# Patient Record
Sex: Male | Born: 1945 | Race: White | Hispanic: No | Marital: Married | Smoking: Former smoker
Health system: Southern US, Community
[De-identification: ages and names within clinical notes are randomized; demographics above are authoritative.]

## PROBLEM LIST (undated history)

## (undated) DIAGNOSIS — I499 Cardiac arrhythmia, unspecified: Secondary | ICD-10-CM

## (undated) DIAGNOSIS — R52 Pain, unspecified: Secondary | ICD-10-CM

## (undated) DIAGNOSIS — I2699 Other pulmonary embolism without acute cor pulmonale: Secondary | ICD-10-CM

## (undated) DIAGNOSIS — Z974 Presence of external hearing-aid: Secondary | ICD-10-CM

## (undated) DIAGNOSIS — H9190 Unspecified hearing loss, unspecified ear: Secondary | ICD-10-CM

## (undated) DIAGNOSIS — J189 Pneumonia, unspecified organism: Secondary | ICD-10-CM

## (undated) DIAGNOSIS — G629 Polyneuropathy, unspecified: Secondary | ICD-10-CM

## (undated) DIAGNOSIS — I10 Essential (primary) hypertension: Secondary | ICD-10-CM

## (undated) DIAGNOSIS — K219 Gastro-esophageal reflux disease without esophagitis: Secondary | ICD-10-CM

## (undated) DIAGNOSIS — F419 Anxiety disorder, unspecified: Secondary | ICD-10-CM

## (undated) DIAGNOSIS — E785 Hyperlipidemia, unspecified: Secondary | ICD-10-CM

## (undated) DIAGNOSIS — R39198 Other difficulties with micturition: Secondary | ICD-10-CM

## (undated) DIAGNOSIS — Z87442 Personal history of urinary calculi: Secondary | ICD-10-CM

## (undated) DIAGNOSIS — S32000A Wedge compression fracture of unspecified lumbar vertebra, initial encounter for closed fracture: Secondary | ICD-10-CM

## (undated) DIAGNOSIS — N400 Enlarged prostate without lower urinary tract symptoms: Secondary | ICD-10-CM

## (undated) DIAGNOSIS — I4891 Unspecified atrial fibrillation: Secondary | ICD-10-CM

## (undated) DIAGNOSIS — C9000 Multiple myeloma not having achieved remission: Secondary | ICD-10-CM

## (undated) DIAGNOSIS — R972 Elevated prostate specific antigen [PSA]: Secondary | ICD-10-CM

## (undated) DIAGNOSIS — I639 Cerebral infarction, unspecified: Secondary | ICD-10-CM

## (undated) DIAGNOSIS — T4145XA Adverse effect of unspecified anesthetic, initial encounter: Secondary | ICD-10-CM

## (undated) DIAGNOSIS — A419 Sepsis, unspecified organism: Secondary | ICD-10-CM

## (undated) DIAGNOSIS — T8859XA Other complications of anesthesia, initial encounter: Secondary | ICD-10-CM

## (undated) DIAGNOSIS — R002 Palpitations: Secondary | ICD-10-CM

## (undated) HISTORY — PX: EYE SURGERY: SHX253

## (undated) HISTORY — DX: Gastro-esophageal reflux disease without esophagitis: K21.9

## (undated) HISTORY — DX: Benign prostatic hyperplasia without lower urinary tract symptoms: N40.0

## (undated) HISTORY — PX: LIMBAL STEM CELL TRANSPLANT: SHX1969

## (undated) HISTORY — DX: Sepsis, unspecified organism: A41.9

## (undated) HISTORY — DX: Unspecified atrial fibrillation: I48.91

## (undated) HISTORY — DX: Cerebral infarction, unspecified: I63.9

## (undated) HISTORY — DX: Other pulmonary embolism without acute cor pulmonale: I26.99

## (undated) HISTORY — DX: Elevated prostate specific antigen (PSA): R97.20

## (undated) HISTORY — DX: Anxiety disorder, unspecified: F41.9

## (undated) HISTORY — DX: Essential (primary) hypertension: I10

## (undated) HISTORY — DX: Multiple myeloma not having achieved remission: C90.00

## (undated) HISTORY — DX: Hyperlipidemia, unspecified: E78.5

## (undated) HISTORY — DX: Other difficulties with micturition: R39.198

---

## 1990-10-09 HISTORY — PX: KNEE ARTHROSCOPY: SUR90

## 1992-10-09 HISTORY — PX: BACK SURGERY: SHX140

## 2003-11-04 ENCOUNTER — Other Ambulatory Visit: Payer: Self-pay

## 2003-12-06 ENCOUNTER — Other Ambulatory Visit: Payer: Self-pay

## 2004-06-29 ENCOUNTER — Other Ambulatory Visit: Payer: Self-pay

## 2006-03-21 ENCOUNTER — Other Ambulatory Visit: Payer: Self-pay

## 2006-03-21 ENCOUNTER — Inpatient Hospital Stay: Payer: Self-pay | Admitting: Internal Medicine

## 2006-10-09 HISTORY — PX: OTHER SURGICAL HISTORY: SHX169

## 2007-02-07 ENCOUNTER — Ambulatory Visit: Payer: Self-pay | Admitting: Oncology

## 2007-02-22 ENCOUNTER — Ambulatory Visit: Payer: Self-pay

## 2007-02-28 ENCOUNTER — Ambulatory Visit: Payer: Self-pay | Admitting: Oncology

## 2007-03-10 ENCOUNTER — Ambulatory Visit: Payer: Self-pay | Admitting: Oncology

## 2007-04-09 ENCOUNTER — Ambulatory Visit: Payer: Self-pay | Admitting: Oncology

## 2007-05-10 ENCOUNTER — Ambulatory Visit: Payer: Self-pay | Admitting: Oncology

## 2007-06-10 ENCOUNTER — Ambulatory Visit: Payer: Self-pay | Admitting: Oncology

## 2007-07-10 ENCOUNTER — Ambulatory Visit: Payer: Self-pay | Admitting: Oncology

## 2007-09-09 ENCOUNTER — Ambulatory Visit: Payer: Self-pay | Admitting: Oncology

## 2007-09-16 ENCOUNTER — Ambulatory Visit: Payer: Self-pay | Admitting: Oncology

## 2007-10-10 ENCOUNTER — Ambulatory Visit: Payer: Self-pay | Admitting: Oncology

## 2007-11-10 ENCOUNTER — Ambulatory Visit: Payer: Self-pay | Admitting: Oncology

## 2007-12-08 ENCOUNTER — Ambulatory Visit: Payer: Self-pay | Admitting: Oncology

## 2008-01-08 ENCOUNTER — Ambulatory Visit: Payer: Self-pay | Admitting: Oncology

## 2008-02-07 ENCOUNTER — Ambulatory Visit: Payer: Self-pay | Admitting: Oncology

## 2008-03-09 ENCOUNTER — Ambulatory Visit: Payer: Self-pay | Admitting: Oncology

## 2008-04-08 ENCOUNTER — Ambulatory Visit: Payer: Self-pay | Admitting: Oncology

## 2008-04-11 ENCOUNTER — Ambulatory Visit: Payer: Self-pay | Admitting: Family Medicine

## 2008-05-09 ENCOUNTER — Ambulatory Visit: Payer: Self-pay | Admitting: Oncology

## 2008-06-09 ENCOUNTER — Ambulatory Visit: Payer: Self-pay | Admitting: Oncology

## 2008-07-09 ENCOUNTER — Ambulatory Visit: Payer: Self-pay | Admitting: Oncology

## 2008-08-09 ENCOUNTER — Ambulatory Visit: Payer: Self-pay | Admitting: Oncology

## 2008-09-08 ENCOUNTER — Ambulatory Visit: Payer: Self-pay | Admitting: Oncology

## 2008-10-09 ENCOUNTER — Ambulatory Visit: Payer: Self-pay | Admitting: Oncology

## 2008-11-09 ENCOUNTER — Ambulatory Visit: Payer: Self-pay | Admitting: Oncology

## 2008-12-07 ENCOUNTER — Ambulatory Visit: Payer: Self-pay | Admitting: Oncology

## 2009-01-07 ENCOUNTER — Ambulatory Visit: Payer: Self-pay | Admitting: Oncology

## 2009-01-12 ENCOUNTER — Ambulatory Visit: Payer: Self-pay | Admitting: Oncology

## 2009-02-06 ENCOUNTER — Ambulatory Visit: Payer: Self-pay | Admitting: Oncology

## 2009-03-09 ENCOUNTER — Ambulatory Visit: Payer: Self-pay | Admitting: Oncology

## 2009-04-08 ENCOUNTER — Ambulatory Visit: Payer: Self-pay | Admitting: Oncology

## 2009-04-24 ENCOUNTER — Ambulatory Visit: Payer: Self-pay

## 2009-05-09 ENCOUNTER — Ambulatory Visit: Payer: Self-pay | Admitting: Oncology

## 2009-05-10 ENCOUNTER — Ambulatory Visit: Payer: Self-pay | Admitting: Pain Medicine

## 2009-05-25 ENCOUNTER — Ambulatory Visit: Payer: Self-pay | Admitting: Pain Medicine

## 2009-06-10 ENCOUNTER — Ambulatory Visit: Payer: Self-pay | Admitting: Physician Assistant

## 2009-06-22 ENCOUNTER — Ambulatory Visit: Payer: Self-pay | Admitting: Pain Medicine

## 2009-07-07 ENCOUNTER — Ambulatory Visit: Payer: Self-pay | Admitting: Physician Assistant

## 2009-07-09 ENCOUNTER — Ambulatory Visit: Payer: Self-pay | Admitting: Oncology

## 2009-07-27 ENCOUNTER — Ambulatory Visit: Payer: Self-pay | Admitting: Oncology

## 2009-08-09 ENCOUNTER — Ambulatory Visit: Payer: Self-pay | Admitting: Oncology

## 2009-11-09 ENCOUNTER — Ambulatory Visit: Payer: Self-pay | Admitting: Oncology

## 2009-11-16 ENCOUNTER — Ambulatory Visit: Payer: Self-pay | Admitting: Oncology

## 2009-12-07 ENCOUNTER — Ambulatory Visit: Payer: Self-pay | Admitting: Oncology

## 2010-01-07 ENCOUNTER — Ambulatory Visit: Payer: Self-pay | Admitting: Oncology

## 2010-02-06 ENCOUNTER — Ambulatory Visit: Payer: Self-pay | Admitting: Oncology

## 2010-03-09 ENCOUNTER — Ambulatory Visit: Payer: Self-pay | Admitting: Oncology

## 2010-04-08 ENCOUNTER — Ambulatory Visit: Payer: Self-pay | Admitting: Oncology

## 2010-04-27 ENCOUNTER — Emergency Department: Payer: Self-pay | Admitting: Unknown Physician Specialty

## 2010-05-09 ENCOUNTER — Ambulatory Visit: Payer: Self-pay | Admitting: Oncology

## 2010-06-09 ENCOUNTER — Ambulatory Visit: Payer: Self-pay | Admitting: Oncology

## 2010-07-09 ENCOUNTER — Ambulatory Visit: Payer: Self-pay | Admitting: Oncology

## 2010-07-13 ENCOUNTER — Ambulatory Visit: Payer: Self-pay | Admitting: Oncology

## 2010-08-03 ENCOUNTER — Ambulatory Visit: Payer: Self-pay | Admitting: Pain Medicine

## 2010-08-08 ENCOUNTER — Ambulatory Visit: Payer: Self-pay

## 2010-08-09 ENCOUNTER — Ambulatory Visit: Payer: Self-pay | Admitting: Oncology

## 2010-09-08 ENCOUNTER — Ambulatory Visit: Payer: Self-pay | Admitting: Oncology

## 2010-10-09 ENCOUNTER — Ambulatory Visit: Payer: Self-pay | Admitting: Oncology

## 2010-10-09 HISTORY — PX: PAIN PUMP IMPLANTATION: SHX330

## 2010-11-09 ENCOUNTER — Ambulatory Visit: Payer: Self-pay | Admitting: Oncology

## 2010-12-05 ENCOUNTER — Ambulatory Visit: Payer: Self-pay | Admitting: Pain Medicine

## 2010-12-08 ENCOUNTER — Ambulatory Visit: Payer: Self-pay | Admitting: Oncology

## 2011-01-02 ENCOUNTER — Inpatient Hospital Stay: Payer: Self-pay | Admitting: Pain Medicine

## 2011-01-02 ENCOUNTER — Ambulatory Visit: Payer: Self-pay | Admitting: Pain Medicine

## 2011-01-08 ENCOUNTER — Ambulatory Visit: Payer: Self-pay | Admitting: Oncology

## 2011-01-16 ENCOUNTER — Ambulatory Visit: Payer: Self-pay | Admitting: Pain Medicine

## 2011-01-25 ENCOUNTER — Ambulatory Visit: Payer: Self-pay | Admitting: Pain Medicine

## 2011-01-27 ENCOUNTER — Ambulatory Visit: Payer: Self-pay | Admitting: Pain Medicine

## 2011-02-07 ENCOUNTER — Ambulatory Visit: Payer: Self-pay | Admitting: Oncology

## 2011-02-07 ENCOUNTER — Ambulatory Visit: Payer: Self-pay | Admitting: Pain Medicine

## 2011-02-13 ENCOUNTER — Ambulatory Visit: Payer: Self-pay | Admitting: Pain Medicine

## 2011-02-14 ENCOUNTER — Ambulatory Visit: Payer: Self-pay | Admitting: Pain Medicine

## 2011-02-20 ENCOUNTER — Ambulatory Visit: Payer: Self-pay | Admitting: Pain Medicine

## 2011-02-27 ENCOUNTER — Ambulatory Visit: Payer: Self-pay | Admitting: Pain Medicine

## 2011-03-08 ENCOUNTER — Ambulatory Visit: Payer: Self-pay | Admitting: Pain Medicine

## 2011-03-10 ENCOUNTER — Ambulatory Visit: Payer: Self-pay | Admitting: Oncology

## 2011-03-10 DEATH — deceased

## 2011-03-15 ENCOUNTER — Other Ambulatory Visit: Payer: Self-pay | Admitting: Pain Medicine

## 2011-03-15 ENCOUNTER — Ambulatory Visit: Payer: Self-pay | Admitting: Oncology

## 2011-03-15 ENCOUNTER — Ambulatory Visit: Payer: Self-pay | Admitting: Pain Medicine

## 2011-03-22 ENCOUNTER — Ambulatory Visit: Payer: Self-pay | Admitting: Pain Medicine

## 2011-03-27 ENCOUNTER — Ambulatory Visit: Payer: Self-pay | Admitting: Pain Medicine

## 2011-04-10 ENCOUNTER — Ambulatory Visit: Payer: Self-pay | Admitting: Pain Medicine

## 2011-04-11 ENCOUNTER — Ambulatory Visit: Payer: Self-pay | Admitting: Oncology

## 2011-04-19 ENCOUNTER — Ambulatory Visit: Payer: Self-pay | Admitting: Oncology

## 2011-04-24 ENCOUNTER — Ambulatory Visit: Payer: Self-pay | Admitting: Pain Medicine

## 2011-04-25 ENCOUNTER — Ambulatory Visit: Payer: Self-pay | Admitting: Pain Medicine

## 2011-05-10 ENCOUNTER — Ambulatory Visit: Payer: Self-pay | Admitting: Oncology

## 2011-05-15 ENCOUNTER — Ambulatory Visit: Payer: Self-pay | Admitting: Pain Medicine

## 2011-05-22 ENCOUNTER — Ambulatory Visit: Payer: Self-pay | Admitting: Pain Medicine

## 2011-05-31 ENCOUNTER — Ambulatory Visit: Payer: Self-pay | Admitting: Oncology

## 2011-06-10 ENCOUNTER — Ambulatory Visit: Payer: Self-pay | Admitting: Oncology

## 2011-07-10 ENCOUNTER — Ambulatory Visit: Payer: Self-pay | Admitting: Pain Medicine

## 2011-08-04 ENCOUNTER — Ambulatory Visit: Payer: Self-pay | Admitting: Oncology

## 2011-08-10 ENCOUNTER — Ambulatory Visit: Payer: Self-pay | Admitting: Oncology

## 2011-09-18 ENCOUNTER — Ambulatory Visit: Payer: Self-pay | Admitting: Pain Medicine

## 2011-10-04 ENCOUNTER — Ambulatory Visit: Payer: Self-pay | Admitting: Oncology

## 2011-10-10 ENCOUNTER — Ambulatory Visit: Payer: Self-pay | Admitting: Oncology

## 2011-10-13 ENCOUNTER — Ambulatory Visit: Payer: Self-pay | Admitting: Oncology

## 2011-11-06 LAB — CBC
HCT: 36.5 % — ABNORMAL LOW (ref 40.0–52.0)
HGB: 12.3 g/dL — ABNORMAL LOW (ref 13.0–18.0)
MCV: 106 fL — ABNORMAL HIGH (ref 80–100)
RBC: 3.45 10*6/uL — ABNORMAL LOW (ref 4.40–5.90)
RDW: 15.4 % — ABNORMAL HIGH (ref 11.5–14.5)

## 2011-11-06 LAB — BASIC METABOLIC PANEL
Calcium, Total: 8.9 mg/dL (ref 8.5–10.1)
Co2: 28 mmol/L (ref 21–32)
Osmolality: 283 (ref 275–301)
Potassium: 3.9 mmol/L (ref 3.5–5.1)
Sodium: 141 mmol/L (ref 136–145)

## 2011-11-07 ENCOUNTER — Inpatient Hospital Stay: Payer: Self-pay | Admitting: Internal Medicine

## 2011-11-07 LAB — DIFFERENTIAL
Basophil %: 0.2 %
Eosinophil #: 0.1 10*3/uL (ref 0.0–0.7)
Eosinophil %: 0.9 %
Monocyte #: 0.7 10*3/uL (ref 0.0–0.7)
Monocyte %: 11.1 %
Neutrophil %: 74.2 %

## 2011-11-10 ENCOUNTER — Emergency Department: Payer: Self-pay | Admitting: Emergency Medicine

## 2011-11-10 ENCOUNTER — Ambulatory Visit: Payer: Self-pay | Admitting: Oncology

## 2011-11-11 ENCOUNTER — Ambulatory Visit: Payer: Self-pay | Admitting: Oncology

## 2011-11-27 ENCOUNTER — Ambulatory Visit: Payer: Self-pay | Admitting: Pain Medicine

## 2012-01-12 ENCOUNTER — Ambulatory Visit: Payer: Self-pay | Admitting: Oncology

## 2012-01-12 LAB — CBC CANCER CENTER
Basophil #: 0 x10 3/mm (ref 0.0–0.1)
Basophil %: 0.8 %
Eosinophil #: 0.3 x10 3/mm (ref 0.0–0.7)
HCT: 36 % — ABNORMAL LOW (ref 40.0–52.0)
HGB: 12.1 g/dL — ABNORMAL LOW (ref 13.0–18.0)
Lymphocyte #: 0.7 x10 3/mm — ABNORMAL LOW (ref 1.0–3.6)
Lymphocyte %: 30.9 %
MCH: 35.2 pg — ABNORMAL HIGH (ref 26.0–34.0)
Monocyte #: 0.3 x10 3/mm (ref 0.0–0.7)
Monocyte %: 11 %
Neutrophil #: 1.1 x10 3/mm — ABNORMAL LOW (ref 1.4–6.5)
Neutrophil %: 46.4 %
Platelet: 98 x10 3/mm — ABNORMAL LOW (ref 150–440)
RDW: 14.7 % — ABNORMAL HIGH (ref 11.5–14.5)
WBC: 2.4 x10 3/mm — ABNORMAL LOW (ref 3.8–10.6)

## 2012-01-12 LAB — COMPREHENSIVE METABOLIC PANEL
Anion Gap: 6 — ABNORMAL LOW (ref 7–16)
Bilirubin,Total: 0.7 mg/dL (ref 0.2–1.0)
Calcium, Total: 8.8 mg/dL (ref 8.5–10.1)
Chloride: 106 mmol/L (ref 98–107)
Co2: 29 mmol/L (ref 21–32)
Creatinine: 1.39 mg/dL — ABNORMAL HIGH (ref 0.60–1.30)
EGFR (African American): >60
Glucose: 86 mg/dL (ref 65–99)
SGOT(AST): 23 U/L (ref 15–37)
SGPT (ALT): 29 U/L
Sodium: 141 mmol/L (ref 136–145)

## 2012-02-05 ENCOUNTER — Ambulatory Visit: Payer: Self-pay | Admitting: Pain Medicine

## 2012-02-07 ENCOUNTER — Ambulatory Visit: Payer: Self-pay | Admitting: Oncology

## 2012-04-01 ENCOUNTER — Ambulatory Visit: Payer: Self-pay | Admitting: Oncology

## 2012-04-01 LAB — COMPREHENSIVE METABOLIC PANEL
Albumin: 3.6 g/dL (ref 3.4–5.0)
Alkaline Phosphatase: 63 U/L (ref 50–136)
Anion Gap: 8 (ref 7–16)
BUN: 15 mg/dL (ref 7–18)
Bilirubin,Total: 0.8 mg/dL (ref 0.2–1.0)
Calcium, Total: 9.2 mg/dL (ref 8.5–10.1)
Chloride: 103 mmol/L (ref 98–107)
Co2: 28 mmol/L (ref 21–32)
Creatinine: 1.14 mg/dL (ref 0.60–1.30)
EGFR (African American): >60
EGFR (Non-African Amer.): >60
Glucose: 99 mg/dL (ref 65–99)
Osmolality: 278 (ref 275–301)
Potassium: 4 mmol/L (ref 3.5–5.1)
SGOT(AST): 15 U/L (ref 15–37)
SGPT (ALT): 29 U/L
Sodium: 139 mmol/L (ref 136–145)
Total Protein: 7.4 g/dL (ref 6.4–8.2)

## 2012-04-01 LAB — CBC CANCER CENTER
Basophil #: 0 x10 3/mm (ref 0.0–0.1)
Basophil %: 0.5 %
Eosinophil #: 0.1 x10 3/mm (ref 0.0–0.7)
HCT: 37.2 % — ABNORMAL LOW (ref 40.0–52.0)
HGB: 12.5 g/dL — ABNORMAL LOW (ref 13.0–18.0)
Lymphocyte #: 0.9 x10 3/mm — ABNORMAL LOW (ref 1.0–3.6)
MCHC: 33.6 g/dL (ref 32.0–36.0)
MCV: 104 fL — ABNORMAL HIGH (ref 80–100)
Monocyte %: 11.6 %
Neutrophil #: 2.6 x10 3/mm (ref 1.4–6.5)
Neutrophil %: 62.9 %
Platelet: 140 x10 3/mm — ABNORMAL LOW (ref 150–440)
RBC: 3.56 10*6/uL — ABNORMAL LOW (ref 4.40–5.90)
RDW: 14.7 % — ABNORMAL HIGH (ref 11.5–14.5)
WBC: 4.1 x10 3/mm (ref 3.8–10.6)

## 2012-04-08 ENCOUNTER — Ambulatory Visit: Payer: Self-pay | Admitting: Oncology

## 2012-04-16 ENCOUNTER — Ambulatory Visit: Payer: Self-pay | Admitting: Pain Medicine

## 2012-05-15 ENCOUNTER — Ambulatory Visit: Payer: Self-pay | Admitting: Oncology

## 2012-05-17 LAB — CBC CANCER CENTER
Basophil %: 1.4 %
Eosinophil %: 1.4 %
HCT: 36.6 % — ABNORMAL LOW (ref 40.0–52.0)
HGB: 12.6 g/dL — ABNORMAL LOW (ref 13.0–18.0)
Lymphocyte #: 0.9 x10 3/mm — ABNORMAL LOW (ref 1.0–3.6)
Lymphocyte %: 35.8 %
MCV: 106 fL — ABNORMAL HIGH (ref 80–100)
Monocyte %: 11.9 %
Platelet: 157 x10 3/mm (ref 150–440)
RBC: 3.46 10*6/uL — ABNORMAL LOW (ref 4.40–5.90)
WBC: 2.6 x10 3/mm — ABNORMAL LOW (ref 3.8–10.6)

## 2012-05-17 LAB — COMPREHENSIVE METABOLIC PANEL
BUN: 21 mg/dL — ABNORMAL HIGH (ref 7–18)
Bilirubin,Total: 0.8 mg/dL (ref 0.2–1.0)
Chloride: 104 mmol/L (ref 98–107)
Co2: 28 mmol/L (ref 21–32)
Creatinine: 1.35 mg/dL — ABNORMAL HIGH (ref 0.60–1.30)
EGFR (African American): >60
EGFR (Non-African Amer.): 54 — ABNORMAL LOW
Osmolality: 276 (ref 275–301)
Potassium: 4.2 mmol/L (ref 3.5–5.1)
SGOT(AST): 20 U/L (ref 15–37)
SGPT (ALT): 33 U/L (ref 12–78)
Sodium: 137 mmol/L (ref 136–145)
Total Protein: 7.3 g/dL (ref 6.4–8.2)

## 2012-05-20 LAB — KAPPA/LAMBDA FREE LIGHT CHAINS (ARMC)

## 2012-05-20 LAB — PROT IMMUNOELECTROPHORES(ARMC)

## 2012-05-20 LAB — PROT IMMUNOELECT,UR-24HR

## 2012-06-09 ENCOUNTER — Ambulatory Visit: Payer: Self-pay | Admitting: Oncology

## 2012-06-24 ENCOUNTER — Ambulatory Visit: Payer: Self-pay | Admitting: Pain Medicine

## 2012-07-19 ENCOUNTER — Ambulatory Visit: Payer: Self-pay | Admitting: Oncology

## 2012-07-19 LAB — CBC CANCER CENTER
Basophil #: 0 x10 3/mm (ref 0.0–0.1)
Eosinophil #: 0.1 x10 3/mm (ref 0.0–0.7)
HCT: 36.3 % — ABNORMAL LOW (ref 40.0–52.0)
Lymphocyte #: 0.6 x10 3/mm — ABNORMAL LOW (ref 1.0–3.6)
MCH: 35.9 pg — ABNORMAL HIGH (ref 26.0–34.0)
MCHC: 33.7 g/dL (ref 32.0–36.0)
MCV: 107 fL — ABNORMAL HIGH (ref 80–100)
Monocyte #: 0.3 x10 3/mm (ref 0.2–1.0)
Monocyte %: 8.1 %
Neutrophil %: 69 %
Platelet: 117 x10 3/mm — ABNORMAL LOW (ref 150–440)
RDW: 13.6 % (ref 11.5–14.5)

## 2012-08-09 ENCOUNTER — Ambulatory Visit: Payer: Self-pay | Admitting: Oncology

## 2012-08-14 ENCOUNTER — Ambulatory Visit: Payer: Self-pay | Admitting: Gastroenterology

## 2012-08-16 LAB — PATHOLOGY REPORT

## 2012-09-02 ENCOUNTER — Ambulatory Visit: Payer: Self-pay | Admitting: Pain Medicine

## 2012-10-11 ENCOUNTER — Ambulatory Visit: Payer: Self-pay | Admitting: Oncology

## 2012-10-11 LAB — CBC CANCER CENTER
Basophil #: 0 x10 3/mm (ref 0.0–0.1)
Basophil %: 1.2 %
Eosinophil #: 0.1 x10 3/mm (ref 0.0–0.7)
Eosinophil %: 3.5 %
HCT: 36.4 % — ABNORMAL LOW (ref 40.0–52.0)
HGB: 12.6 g/dL — ABNORMAL LOW (ref 13.0–18.0)
Lymphocyte %: 26.6 %
MCH: 34.9 pg — ABNORMAL HIGH (ref 26.0–34.0)
MCV: 101 fL — ABNORMAL HIGH (ref 80–100)
Monocyte #: 0.3 x10 3/mm (ref 0.2–1.0)
Neutrophil #: 2 x10 3/mm (ref 1.4–6.5)
Neutrophil %: 58.9 %
RDW: 14 % (ref 11.5–14.5)

## 2012-10-11 LAB — COMPREHENSIVE METABOLIC PANEL
Albumin: 3.5 g/dL (ref 3.4–5.0)
Anion Gap: 7 (ref 7–16)
Co2: 31 mmol/L (ref 21–32)
Creatinine: 1.27 mg/dL (ref 0.60–1.30)
EGFR (African American): >60
EGFR (Non-African Amer.): 58 — ABNORMAL LOW
Glucose: 82 mg/dL (ref 65–99)
SGOT(AST): 20 U/L (ref 15–37)
Sodium: 142 mmol/L (ref 136–145)
Total Protein: 7 g/dL (ref 6.4–8.2)

## 2012-10-14 LAB — PROT IMMUNOELECTROPHORES(ARMC)

## 2012-11-09 ENCOUNTER — Ambulatory Visit: Payer: Self-pay | Admitting: Oncology

## 2012-11-13 ENCOUNTER — Ambulatory Visit: Payer: Self-pay | Admitting: Pain Medicine

## 2013-01-15 ENCOUNTER — Ambulatory Visit: Payer: Self-pay | Admitting: Pain Medicine

## 2013-01-15 ENCOUNTER — Ambulatory Visit: Payer: Self-pay | Admitting: Oncology

## 2013-01-17 ENCOUNTER — Ambulatory Visit: Payer: Self-pay | Admitting: Oncology

## 2013-01-21 LAB — KAPPA/LAMBDA FREE LIGHT CHAINS (ARMC)

## 2013-01-21 LAB — PROT IMMUNOELECTROPHORES(ARMC)

## 2013-01-31 LAB — CBC CANCER CENTER
Basophil #: 0.1 x10 3/mm (ref 0.0–0.1)
Eosinophil #: 0.2 x10 3/mm (ref 0.0–0.7)
Eosinophil %: 5.2 %
HCT: 36.2 % — ABNORMAL LOW (ref 40.0–52.0)
HGB: 12.3 g/dL — ABNORMAL LOW (ref 13.0–18.0)
MCHC: 33.9 g/dL (ref 32.0–36.0)
MCV: 101 fL — ABNORMAL HIGH (ref 80–100)
Monocyte #: 0.7 x10 3/mm (ref 0.2–1.0)
Monocyte %: 16.2 %
RBC: 3.57 10*6/uL — ABNORMAL LOW (ref 4.40–5.90)
RDW: 14.7 % — ABNORMAL HIGH (ref 11.5–14.5)
WBC: 4.3 x10 3/mm (ref 3.8–10.6)

## 2013-01-31 LAB — BASIC METABOLIC PANEL
Anion Gap: 5 — ABNORMAL LOW (ref 7–16)
Co2: 31 mmol/L (ref 21–32)
EGFR (African American): >60
EGFR (Non-African Amer.): 58 — ABNORMAL LOW
Osmolality: 290 (ref 275–301)
Potassium: 4.4 mmol/L (ref 3.5–5.1)

## 2013-02-06 ENCOUNTER — Ambulatory Visit: Payer: Self-pay | Admitting: Oncology

## 2013-03-26 ENCOUNTER — Ambulatory Visit: Payer: Self-pay | Admitting: Pain Medicine

## 2013-04-08 ENCOUNTER — Ambulatory Visit: Payer: Self-pay | Admitting: Oncology

## 2013-04-25 LAB — CBC CANCER CENTER
Basophil #: 0.1 x10 3/mm (ref 0.0–0.1)
Basophil %: 1.4 %
Eosinophil #: 0.2 x10 3/mm (ref 0.0–0.7)
Eosinophil %: 5.5 %
HCT: 39.9 % — ABNORMAL LOW (ref 40.0–52.0)
HGB: 13.7 g/dL (ref 13.0–18.0)
MCH: 35.2 pg — ABNORMAL HIGH (ref 26.0–34.0)
MCV: 102 fL — ABNORMAL HIGH (ref 80–100)
Neutrophil #: 2 x10 3/mm (ref 1.4–6.5)
Neutrophil %: 52.3 %
RBC: 3.9 10*6/uL — ABNORMAL LOW (ref 4.40–5.90)
RDW: 14.8 % — ABNORMAL HIGH (ref 11.5–14.5)

## 2013-04-25 LAB — COMPREHENSIVE METABOLIC PANEL
Albumin: 3.8 g/dL (ref 3.4–5.0)
Alkaline Phosphatase: 62 U/L (ref 50–136)
Anion Gap: 9 (ref 7–16)
BUN: 19 mg/dL — ABNORMAL HIGH (ref 7–18)
Bilirubin,Total: 1 mg/dL (ref 0.2–1.0)
Calcium, Total: 9.1 mg/dL (ref 8.5–10.1)
Co2: 29 mmol/L (ref 21–32)
Creatinine: 1.44 mg/dL — ABNORMAL HIGH (ref 0.60–1.30)
EGFR (African American): 58 — ABNORMAL LOW
EGFR (Non-African Amer.): 50 — ABNORMAL LOW
Glucose: 106 mg/dL — ABNORMAL HIGH (ref 65–99)
Osmolality: 286 (ref 275–301)
Potassium: 4.1 mmol/L (ref 3.5–5.1)
SGOT(AST): 20 U/L (ref 15–37)
Total Protein: 7.5 g/dL (ref 6.4–8.2)

## 2013-04-29 LAB — KAPPA/LAMBDA FREE LIGHT CHAINS (ARMC)

## 2013-04-29 LAB — PROT IMMUNOELECTROPHORES(ARMC)

## 2013-05-09 ENCOUNTER — Ambulatory Visit: Payer: Self-pay | Admitting: Oncology

## 2013-05-26 LAB — CBC
HGB: 12.7 g/dL — ABNORMAL LOW (ref 13.0–18.0)
MCH: 36.5 pg — ABNORMAL HIGH (ref 26.0–34.0)
Platelet: 116 10*3/uL — ABNORMAL LOW (ref 150–440)
RBC: 3.49 10*6/uL — ABNORMAL LOW (ref 4.40–5.90)

## 2013-05-26 LAB — COMPREHENSIVE METABOLIC PANEL
Alkaline Phosphatase: 82 U/L (ref 50–136)
BUN: 28 mg/dL — ABNORMAL HIGH (ref 7–18)
Bilirubin,Total: 0.8 mg/dL (ref 0.2–1.0)
Co2: 29 mmol/L (ref 21–32)
EGFR (African American): >60
EGFR (Non-African Amer.): 53 — ABNORMAL LOW
Osmolality: 281 (ref 275–301)
Sodium: 138 mmol/L (ref 136–145)
Total Protein: 6.8 g/dL (ref 6.4–8.2)

## 2013-05-26 LAB — TROPONIN I: Troponin-I: <0.02 ng/mL

## 2013-05-27 ENCOUNTER — Inpatient Hospital Stay: Payer: Self-pay | Admitting: Internal Medicine

## 2013-05-27 LAB — APTT: Activated PTT: 72.8 secs — ABNORMAL HIGH (ref 23.6–35.9)

## 2013-05-28 LAB — BASIC METABOLIC PANEL
Anion Gap: 5 — ABNORMAL LOW (ref 7–16)
BUN: 15 mg/dL (ref 7–18)
Calcium, Total: 8.2 mg/dL — ABNORMAL LOW (ref 8.5–10.1)
Chloride: 104 mmol/L (ref 98–107)
EGFR (African American): >60
Osmolality: 271 (ref 275–301)
Potassium: 3.6 mmol/L (ref 3.5–5.1)

## 2013-05-29 LAB — BASIC METABOLIC PANEL
BUN: 14 mg/dL (ref 7–18)
Calcium, Total: 8.2 mg/dL — ABNORMAL LOW (ref 8.5–10.1)
Chloride: 105 mmol/L (ref 98–107)
Co2: 22 mmol/L (ref 21–32)
Glucose: 86 mg/dL (ref 65–99)
Potassium: 3.7 mmol/L (ref 3.5–5.1)
Sodium: 136 mmol/L (ref 136–145)

## 2013-05-29 LAB — CBC WITH DIFFERENTIAL/PLATELET
Basophil #: 0 10*3/uL (ref 0.0–0.1)
Eosinophil #: 0.2 10*3/uL (ref 0.0–0.7)
HGB: 10.5 g/dL — ABNORMAL LOW (ref 13.0–18.0)
Lymphocyte #: 0.5 10*3/uL — ABNORMAL LOW (ref 1.0–3.6)
Lymphocyte %: 12 %
Neutrophil #: 2.8 10*3/uL (ref 1.4–6.5)
Neutrophil %: 66.5 %
RDW: 14.8 % — ABNORMAL HIGH (ref 11.5–14.5)

## 2013-06-02 ENCOUNTER — Ambulatory Visit: Payer: Self-pay | Admitting: Pain Medicine

## 2013-06-06 ENCOUNTER — Ambulatory Visit: Payer: Self-pay | Admitting: Oncology

## 2013-06-06 LAB — COMPREHENSIVE METABOLIC PANEL
Bilirubin,Total: 0.5 mg/dL (ref 0.2–1.0)
Calcium, Total: 8.7 mg/dL (ref 8.5–10.1)
Chloride: 103 mmol/L (ref 98–107)
Creatinine: 1.38 mg/dL — ABNORMAL HIGH (ref 0.60–1.30)
Glucose: 117 mg/dL — ABNORMAL HIGH (ref 65–99)
Potassium: 4.6 mmol/L (ref 3.5–5.1)
SGPT (ALT): 87 U/L — ABNORMAL HIGH (ref 12–78)

## 2013-06-06 LAB — CBC CANCER CENTER
Basophil #: 0.1 x10 3/mm (ref 0.0–0.1)
Basophil %: 1.7 %
Eosinophil %: 1.4 %
MCH: 34.9 pg — ABNORMAL HIGH (ref 26.0–34.0)
MCHC: 33.4 g/dL (ref 32.0–36.0)
MCV: 105 fL — ABNORMAL HIGH (ref 80–100)
Monocyte %: 17.7 %
Neutrophil #: 3.3 x10 3/mm (ref 1.4–6.5)
Neutrophil %: 62.5 %
Platelet: 255 x10 3/mm (ref 150–440)
RBC: 3.43 10*6/uL — ABNORMAL LOW (ref 4.40–5.90)
RDW: 15.1 % — ABNORMAL HIGH (ref 11.5–14.5)
WBC: 5.3 x10 3/mm (ref 3.8–10.6)

## 2013-06-09 ENCOUNTER — Ambulatory Visit: Payer: Self-pay | Admitting: Oncology

## 2013-07-09 ENCOUNTER — Ambulatory Visit: Payer: Self-pay | Admitting: Oncology

## 2013-07-22 ENCOUNTER — Ambulatory Visit: Payer: Self-pay | Admitting: Oncology

## 2013-07-22 LAB — COMPREHENSIVE METABOLIC PANEL
Albumin: 3.1 g/dL — ABNORMAL LOW (ref 3.4–5.0)
Alkaline Phosphatase: 69 U/L (ref 50–136)
Anion Gap: 10 (ref 7–16)
Bilirubin,Total: 0.9 mg/dL (ref 0.2–1.0)
Calcium, Total: 8.6 mg/dL (ref 8.5–10.1)
Chloride: 101 mmol/L (ref 98–107)
Creatinine: 1.21 mg/dL (ref 0.60–1.30)
EGFR (African American): >60
EGFR (Non-African Amer.): >60
Glucose: 109 mg/dL — ABNORMAL HIGH (ref 65–99)
Osmolality: 280 (ref 275–301)
Potassium: 3.3 mmol/L — ABNORMAL LOW (ref 3.5–5.1)
SGOT(AST): 10 U/L — ABNORMAL LOW (ref 15–37)
SGPT (ALT): 29 U/L (ref 12–78)
Total Protein: 6.4 g/dL (ref 6.4–8.2)

## 2013-07-22 LAB — CBC CANCER CENTER
Basophil %: 0.5 %
Eosinophil %: 9.8 %
HCT: 32.8 % — ABNORMAL LOW (ref 40.0–52.0)
HGB: 11.2 g/dL — ABNORMAL LOW (ref 13.0–18.0)
Lymphocyte #: 0.7 x10 3/mm — ABNORMAL LOW (ref 1.0–3.6)
Lymphocyte %: 13.4 %
MCH: 35.5 pg — ABNORMAL HIGH (ref 26.0–34.0)
Monocyte #: 0.7 x10 3/mm (ref 0.2–1.0)
Monocyte %: 14.4 %
Neutrophil #: 3.2 x10 3/mm (ref 1.4–6.5)
RDW: 15.3 % — ABNORMAL HIGH (ref 11.5–14.5)
WBC: 5.1 x10 3/mm (ref 3.8–10.6)

## 2013-07-24 LAB — PROT IMMUNOELECTROPHORES(ARMC)

## 2013-07-24 LAB — KAPPA/LAMBDA FREE LIGHT CHAINS (ARMC)

## 2013-08-07 ENCOUNTER — Ambulatory Visit: Payer: Self-pay | Admitting: Pain Medicine

## 2013-08-09 ENCOUNTER — Ambulatory Visit: Payer: Self-pay | Admitting: Oncology

## 2013-09-19 ENCOUNTER — Ambulatory Visit: Payer: Self-pay | Admitting: Oncology

## 2013-09-19 LAB — CBC CANCER CENTER
Eosinophil #: 0.4 x10 3/mm (ref 0.0–0.7)
Eosinophil %: 7.6 %
HCT: 41 % (ref 40.0–52.0)
Lymphocyte #: 1 x10 3/mm (ref 1.0–3.6)
MCH: 34.8 pg — ABNORMAL HIGH (ref 26.0–34.0)
MCHC: 33.9 g/dL (ref 32.0–36.0)
MCV: 103 fL — ABNORMAL HIGH (ref 80–100)
Monocyte %: 12.5 %
Neutrophil #: 3.1 x10 3/mm (ref 1.4–6.5)
Neutrophil %: 59.5 %
Platelet: 146 x10 3/mm — ABNORMAL LOW (ref 150–440)
RBC: 4 10*6/uL — ABNORMAL LOW (ref 4.40–5.90)
RDW: 15.5 % — ABNORMAL HIGH (ref 11.5–14.5)
WBC: 5.2 x10 3/mm (ref 3.8–10.6)

## 2013-09-19 LAB — COMPREHENSIVE METABOLIC PANEL
Alkaline Phosphatase: 57 U/L
Anion Gap: 6 — ABNORMAL LOW (ref 7–16)
BUN: 12 mg/dL (ref 7–18)
Chloride: 101 mmol/L (ref 98–107)
Co2: 33 mmol/L — ABNORMAL HIGH (ref 21–32)
Creatinine: 1.35 mg/dL — ABNORMAL HIGH (ref 0.60–1.30)
EGFR (African American): >60
Glucose: 111 mg/dL — ABNORMAL HIGH (ref 65–99)
Osmolality: 280 (ref 275–301)
SGOT(AST): 12 U/L — ABNORMAL LOW (ref 15–37)
SGPT (ALT): 35 U/L (ref 12–78)
Sodium: 140 mmol/L (ref 136–145)
Total Protein: 6.7 g/dL (ref 6.4–8.2)

## 2013-09-23 LAB — PROT IMMUNOELECT,UR-24HR

## 2013-09-23 LAB — PROT IMMUNOELECTROPHORES(ARMC)

## 2013-10-09 ENCOUNTER — Ambulatory Visit: Payer: Self-pay | Admitting: Oncology

## 2013-10-16 ENCOUNTER — Ambulatory Visit: Payer: Self-pay | Admitting: Pain Medicine

## 2013-11-21 ENCOUNTER — Ambulatory Visit: Payer: Self-pay | Admitting: Oncology

## 2013-11-21 LAB — COMPREHENSIVE METABOLIC PANEL
Albumin: 3.2 g/dL — ABNORMAL LOW (ref 3.4–5.0)
Alkaline Phosphatase: 60 U/L
Anion Gap: 5 — ABNORMAL LOW (ref 7–16)
BILIRUBIN TOTAL: 0.6 mg/dL (ref 0.2–1.0)
BUN: 19 mg/dL — AB (ref 7–18)
CALCIUM: 8.7 mg/dL (ref 8.5–10.1)
CO2: 30 mmol/L (ref 21–32)
CREATININE: 1.31 mg/dL — AB (ref 0.60–1.30)
Chloride: 103 mmol/L (ref 98–107)
EGFR (African American): >60
GFR CALC NON AF AMER: 56 — AB
Glucose: 119 mg/dL — ABNORMAL HIGH (ref 65–99)
Osmolality: 279 (ref 275–301)
Potassium: 3.9 mmol/L (ref 3.5–5.1)
SGOT(AST): 13 U/L — ABNORMAL LOW (ref 15–37)
SGPT (ALT): 30 U/L (ref 12–78)
SODIUM: 138 mmol/L (ref 136–145)
Total Protein: 6.1 g/dL — ABNORMAL LOW (ref 6.4–8.2)

## 2013-11-21 LAB — CBC CANCER CENTER
BASOS PCT: 1.6 %
Basophil #: 0.1 x10 3/mm (ref 0.0–0.1)
EOS PCT: 4.1 %
Eosinophil #: 0.2 x10 3/mm (ref 0.0–0.7)
HCT: 34.9 % — ABNORMAL LOW (ref 40.0–52.0)
HGB: 12 g/dL — ABNORMAL LOW (ref 13.0–18.0)
LYMPHS ABS: 0.5 x10 3/mm — AB (ref 1.0–3.6)
Lymphocyte %: 10.1 %
MCH: 35.2 pg — AB (ref 26.0–34.0)
MCHC: 34.5 g/dL (ref 32.0–36.0)
MCV: 102 fL — AB (ref 80–100)
Monocyte #: 0.8 x10 3/mm (ref 0.2–1.0)
Monocyte %: 17.7 %
Neutrophil #: 3.2 x10 3/mm (ref 1.4–6.5)
Neutrophil %: 66.5 %
Platelet: 190 x10 3/mm (ref 150–440)
RBC: 3.42 10*6/uL — AB (ref 4.40–5.90)
RDW: 16.1 % — AB (ref 11.5–14.5)
WBC: 4.8 x10 3/mm (ref 3.8–10.6)

## 2013-11-24 LAB — PROT IMMUNOELECTROPHORES(ARMC)

## 2013-11-24 LAB — KAPPA/LAMBDA FREE LIGHT CHAINS (ARMC)

## 2013-11-26 LAB — PROT IMMUNOELECT,UR-24HR

## 2013-12-07 ENCOUNTER — Ambulatory Visit: Payer: Self-pay | Admitting: Oncology

## 2013-12-29 ENCOUNTER — Ambulatory Visit: Payer: Self-pay | Admitting: Pain Medicine

## 2014-01-29 DIAGNOSIS — I10 Essential (primary) hypertension: Secondary | ICD-10-CM | POA: Insufficient documentation

## 2014-01-29 DIAGNOSIS — I2699 Other pulmonary embolism without acute cor pulmonale: Secondary | ICD-10-CM | POA: Insufficient documentation

## 2014-01-29 DIAGNOSIS — I2782 Chronic pulmonary embolism: Secondary | ICD-10-CM | POA: Insufficient documentation

## 2014-01-29 DIAGNOSIS — E782 Mixed hyperlipidemia: Secondary | ICD-10-CM | POA: Insufficient documentation

## 2014-01-29 DIAGNOSIS — K219 Gastro-esophageal reflux disease without esophagitis: Secondary | ICD-10-CM | POA: Insufficient documentation

## 2014-02-20 ENCOUNTER — Ambulatory Visit: Payer: Self-pay | Admitting: Oncology

## 2014-02-20 LAB — COMPREHENSIVE METABOLIC PANEL
ALBUMIN: 3.7 g/dL (ref 3.4–5.0)
ALK PHOS: 62 U/L
Anion Gap: 8 (ref 7–16)
BUN: 20 mg/dL — AB (ref 7–18)
Bilirubin,Total: 0.6 mg/dL (ref 0.2–1.0)
CALCIUM: 9.2 mg/dL (ref 8.5–10.1)
CO2: 28 mmol/L (ref 21–32)
Chloride: 102 mmol/L (ref 98–107)
Creatinine: 1.5 mg/dL — ABNORMAL HIGH (ref 0.60–1.30)
EGFR (African American): 55 — ABNORMAL LOW
EGFR (Non-African Amer.): 47 — ABNORMAL LOW
Glucose: 127 mg/dL — ABNORMAL HIGH (ref 65–99)
Osmolality: 280 (ref 275–301)
Potassium: 3.9 mmol/L (ref 3.5–5.1)
SGOT(AST): 20 U/L (ref 15–37)
SGPT (ALT): 43 U/L (ref 12–78)
SODIUM: 138 mmol/L (ref 136–145)
Total Protein: 6.6 g/dL (ref 6.4–8.2)

## 2014-02-20 LAB — CBC CANCER CENTER
Basophil #: 0.1 x10 3/mm (ref 0.0–0.1)
Basophil %: 0.8 %
Eosinophil #: 0.1 x10 3/mm (ref 0.0–0.7)
Eosinophil %: 2.2 %
HCT: 39.5 % — AB (ref 40.0–52.0)
HGB: 13.3 g/dL (ref 13.0–18.0)
LYMPHS ABS: 0.7 x10 3/mm — AB (ref 1.0–3.6)
Lymphocyte %: 9.8 %
MCH: 33.6 pg (ref 26.0–34.0)
MCHC: 33.6 g/dL (ref 32.0–36.0)
MCV: 100 fL (ref 80–100)
MONO ABS: 0.4 x10 3/mm (ref 0.2–1.0)
MONOS PCT: 6.3 %
Neutrophil #: 5.5 x10 3/mm (ref 1.4–6.5)
Neutrophil %: 80.9 %
Platelet: 172 x10 3/mm (ref 150–440)
RBC: 3.94 10*6/uL — AB (ref 4.40–5.90)
RDW: 15.7 % — AB (ref 11.5–14.5)
WBC: 6.8 x10 3/mm (ref 3.8–10.6)

## 2014-02-25 LAB — KAPPA/LAMBDA FREE LIGHT CHAINS (ARMC)

## 2014-02-25 LAB — PROT IMMUNOELECTROPHORES(ARMC)

## 2014-03-09 ENCOUNTER — Ambulatory Visit: Payer: Self-pay | Admitting: Oncology

## 2014-04-14 ENCOUNTER — Ambulatory Visit: Payer: Self-pay | Admitting: Pain Medicine

## 2014-05-22 ENCOUNTER — Ambulatory Visit: Payer: Self-pay | Admitting: Oncology

## 2014-05-22 LAB — COMPREHENSIVE METABOLIC PANEL
ANION GAP: 8 (ref 7–16)
AST: 12 U/L — AB (ref 15–37)
Albumin: 3.1 g/dL — ABNORMAL LOW (ref 3.4–5.0)
Alkaline Phosphatase: 60 U/L
BILIRUBIN TOTAL: 0.8 mg/dL (ref 0.2–1.0)
BUN: 12 mg/dL (ref 7–18)
Calcium, Total: 8.6 mg/dL (ref 8.5–10.1)
Chloride: 102 mmol/L (ref 98–107)
Co2: 29 mmol/L (ref 21–32)
Creatinine: 1.27 mg/dL (ref 0.60–1.30)
EGFR (African American): >60
EGFR (Non-African Amer.): 58 — ABNORMAL LOW
Glucose: 113 mg/dL — ABNORMAL HIGH (ref 65–99)
Osmolality: 278 (ref 275–301)
POTASSIUM: 3.6 mmol/L (ref 3.5–5.1)
SGPT (ALT): 32 U/L
Sodium: 139 mmol/L (ref 136–145)
TOTAL PROTEIN: 6.1 g/dL — AB (ref 6.4–8.2)

## 2014-05-22 LAB — CBC CANCER CENTER
BASOS ABS: 0.1 x10 3/mm (ref 0.0–0.1)
BASOS PCT: 0.8 %
Eosinophil #: 0.6 x10 3/mm (ref 0.0–0.7)
Eosinophil %: 7.6 %
HCT: 38.2 % — AB (ref 40.0–52.0)
HGB: 13.1 g/dL (ref 13.0–18.0)
LYMPHS ABS: 0.8 x10 3/mm — AB (ref 1.0–3.6)
LYMPHS PCT: 10.9 %
MCH: 34.6 pg — ABNORMAL HIGH (ref 26.0–34.0)
MCHC: 34.4 g/dL (ref 32.0–36.0)
MCV: 101 fL — AB (ref 80–100)
Monocyte #: 0.7 x10 3/mm (ref 0.2–1.0)
Monocyte %: 10.2 %
NEUTROS ABS: 5.1 x10 3/mm (ref 1.4–6.5)
Neutrophil %: 70.5 %
PLATELETS: 168 x10 3/mm (ref 150–440)
RBC: 3.8 10*6/uL — AB (ref 4.40–5.90)
RDW: 15.8 % — ABNORMAL HIGH (ref 11.5–14.5)
WBC: 7.3 x10 3/mm (ref 3.8–10.6)

## 2014-05-26 LAB — PROT IMMUNOELECTROPHORES(ARMC)

## 2014-05-26 LAB — KAPPA/LAMBDA FREE LIGHT CHAINS (ARMC)

## 2014-06-09 ENCOUNTER — Ambulatory Visit: Payer: Self-pay | Admitting: Oncology

## 2014-07-23 ENCOUNTER — Ambulatory Visit: Payer: Self-pay | Admitting: Pain Medicine

## 2014-07-25 ENCOUNTER — Ambulatory Visit: Payer: Self-pay

## 2014-07-25 LAB — CBC WITH DIFFERENTIAL/PLATELET
BASOS ABS: 0 10*3/uL (ref 0.0–0.1)
BASOS PCT: 0.6 %
Eosinophil #: 0.5 10*3/uL (ref 0.0–0.7)
Eosinophil %: 8.6 %
HCT: 33.7 % — AB (ref 40.0–52.0)
HGB: 11.5 g/dL — AB (ref 13.0–18.0)
LYMPHS ABS: 0.4 10*3/uL — AB (ref 1.0–3.6)
Lymphocyte %: 6.7 %
MCH: 34.4 pg — ABNORMAL HIGH (ref 26.0–34.0)
MCHC: 34 g/dL (ref 32.0–36.0)
MCV: 101 fL — AB (ref 80–100)
MONO ABS: 0.8 x10 3/mm (ref 0.2–1.0)
Monocyte %: 14.4 %
NEUTROS PCT: 69.7 %
Neutrophil #: 3.7 10*3/uL (ref 1.4–6.5)
PLATELETS: 164 10*3/uL (ref 150–440)
RBC: 3.33 10*6/uL — ABNORMAL LOW (ref 4.40–5.90)
RDW: 15.4 % — AB (ref 11.5–14.5)
WBC: 5.3 10*3/uL (ref 3.8–10.6)

## 2014-07-25 LAB — COMPREHENSIVE METABOLIC PANEL
ALK PHOS: 63 U/L
Albumin: 3.2 g/dL — ABNORMAL LOW (ref 3.4–5.0)
Anion Gap: 7 (ref 7–16)
BILIRUBIN TOTAL: 0.6 mg/dL (ref 0.2–1.0)
BUN: 15 mg/dL (ref 7–18)
CHLORIDE: 102 mmol/L (ref 98–107)
CO2: 28 mmol/L (ref 21–32)
CREATININE: 1.41 mg/dL — AB (ref 0.60–1.30)
Calcium, Total: 8.8 mg/dL (ref 8.5–10.1)
EGFR (African American): >60
EGFR (Non-African Amer.): 53 — ABNORMAL LOW
Glucose: 112 mg/dL — ABNORMAL HIGH (ref 65–99)
Osmolality: 275 (ref 275–301)
POTASSIUM: 3.7 mmol/L (ref 3.5–5.1)
SGOT(AST): 11 U/L — ABNORMAL LOW (ref 15–37)
SGPT (ALT): 30 U/L
Sodium: 137 mmol/L (ref 136–145)
TOTAL PROTEIN: 6 g/dL — AB (ref 6.4–8.2)

## 2014-08-07 ENCOUNTER — Emergency Department: Payer: Self-pay | Admitting: Emergency Medicine

## 2014-08-07 LAB — COMPREHENSIVE METABOLIC PANEL
AST: 11 U/L — AB (ref 15–37)
Albumin: 3.2 g/dL — ABNORMAL LOW (ref 3.4–5.0)
Alkaline Phosphatase: 62 U/L
Anion Gap: 10 (ref 7–16)
BILIRUBIN TOTAL: 0.9 mg/dL (ref 0.2–1.0)
BUN: 13 mg/dL (ref 7–18)
CALCIUM: 8.4 mg/dL — AB (ref 8.5–10.1)
CREATININE: 1.31 mg/dL — AB (ref 0.60–1.30)
Chloride: 100 mmol/L (ref 98–107)
Co2: 26 mmol/L (ref 21–32)
EGFR (Non-African Amer.): 58 — ABNORMAL LOW
GLUCOSE: 121 mg/dL — AB (ref 65–99)
OSMOLALITY: 273 (ref 275–301)
POTASSIUM: 3.7 mmol/L (ref 3.5–5.1)
SGPT (ALT): 29 U/L
Sodium: 136 mmol/L (ref 136–145)
Total Protein: 6 g/dL — ABNORMAL LOW (ref 6.4–8.2)

## 2014-08-07 LAB — CBC WITH DIFFERENTIAL/PLATELET
BASOS ABS: 0 10*3/uL (ref 0.0–0.1)
BASOS PCT: 0.3 %
Eosinophil #: 0.1 10*3/uL (ref 0.0–0.7)
Eosinophil %: 1.1 %
HCT: 38.6 % — ABNORMAL LOW (ref 40.0–52.0)
HGB: 12.8 g/dL — ABNORMAL LOW (ref 13.0–18.0)
LYMPHS ABS: 0.2 10*3/uL — AB (ref 1.0–3.6)
LYMPHS PCT: 2.7 %
MCH: 34.5 pg — AB (ref 26.0–34.0)
MCHC: 33.2 g/dL (ref 32.0–36.0)
MCV: 104 fL — ABNORMAL HIGH (ref 80–100)
MONOS PCT: 4.4 %
Monocyte #: 0.4 x10 3/mm (ref 0.2–1.0)
NEUTROS ABS: 8 10*3/uL — AB (ref 1.4–6.5)
Neutrophil %: 91.5 %
PLATELETS: 138 10*3/uL — AB (ref 150–440)
RBC: 3.72 10*6/uL — ABNORMAL LOW (ref 4.40–5.90)
RDW: 16.7 % — AB (ref 11.5–14.5)
WBC: 8.7 10*3/uL (ref 3.8–10.6)

## 2014-08-21 ENCOUNTER — Ambulatory Visit: Payer: Self-pay | Admitting: Oncology

## 2014-08-21 LAB — COMPREHENSIVE METABOLIC PANEL
ANION GAP: 8 (ref 7–16)
AST: 11 U/L — AB (ref 15–37)
Albumin: 3 g/dL — ABNORMAL LOW (ref 3.4–5.0)
Alkaline Phosphatase: 71 U/L
BUN: 13 mg/dL (ref 7–18)
Bilirubin,Total: 0.8 mg/dL (ref 0.2–1.0)
CALCIUM: 9 mg/dL (ref 8.5–10.1)
CHLORIDE: 102 mmol/L (ref 98–107)
CO2: 29 mmol/L (ref 21–32)
Creatinine: 1.61 mg/dL — ABNORMAL HIGH (ref 0.60–1.30)
GFR CALC AF AMER: 55 — AB
GFR CALC NON AF AMER: 46 — AB
Glucose: 164 mg/dL — ABNORMAL HIGH (ref 65–99)
Osmolality: 281 (ref 275–301)
Potassium: 4 mmol/L (ref 3.5–5.1)
SGPT (ALT): 30 U/L
Sodium: 139 mmol/L (ref 136–145)
Total Protein: 6.3 g/dL — ABNORMAL LOW (ref 6.4–8.2)

## 2014-08-21 LAB — CBC CANCER CENTER
BASOS ABS: 0 x10 3/mm (ref 0.0–0.1)
Basophil %: 0.7 %
Eosinophil #: 0.5 x10 3/mm (ref 0.0–0.7)
Eosinophil %: 8.4 %
HCT: 39.5 % — AB (ref 40.0–52.0)
HGB: 12.7 g/dL — ABNORMAL LOW (ref 13.0–18.0)
LYMPHS PCT: 14.4 %
Lymphocyte #: 0.8 x10 3/mm — ABNORMAL LOW (ref 1.0–3.6)
MCH: 33.1 pg (ref 26.0–34.0)
MCHC: 32.2 g/dL (ref 32.0–36.0)
MCV: 103 fL — ABNORMAL HIGH (ref 80–100)
MONO ABS: 0.4 x10 3/mm (ref 0.2–1.0)
MONOS PCT: 7.3 %
NEUTROS PCT: 69.2 %
Neutrophil #: 3.8 x10 3/mm (ref 1.4–6.5)
Platelet: 188 x10 3/mm (ref 150–440)
RBC: 3.85 10*6/uL — ABNORMAL LOW (ref 4.40–5.90)
RDW: 16.3 % — AB (ref 11.5–14.5)
WBC: 5.5 x10 3/mm (ref 3.8–10.6)

## 2014-08-21 LAB — MAGNESIUM: MAGNESIUM: 1.7 mg/dL — AB

## 2014-08-25 LAB — KAPPA/LAMBDA FREE LIGHT CHAINS (ARMC)

## 2014-08-25 LAB — PROT IMMUNOELECTROPHORES(ARMC)

## 2014-09-08 ENCOUNTER — Ambulatory Visit: Payer: Self-pay | Admitting: Oncology

## 2014-10-29 ENCOUNTER — Ambulatory Visit: Payer: Self-pay | Admitting: Pain Medicine

## 2014-11-13 ENCOUNTER — Ambulatory Visit: Payer: Self-pay | Admitting: Oncology

## 2014-11-13 LAB — CBC CANCER CENTER
Basophil #: 0 x10 3/mm (ref 0.0–0.1)
Basophil %: 1.1 %
Eosinophil #: 0.4 x10 3/mm (ref 0.0–0.7)
Eosinophil %: 8.9 %
HCT: 36 % — ABNORMAL LOW (ref 40.0–52.0)
HGB: 12.1 g/dL — ABNORMAL LOW (ref 13.0–18.0)
Lymphocyte #: 1 x10 3/mm (ref 1.0–3.6)
Lymphocyte %: 22.5 %
MCH: 33.7 pg (ref 26.0–34.0)
MCHC: 33.7 g/dL (ref 32.0–36.0)
MCV: 100 fL (ref 80–100)
Monocyte #: 0.6 x10 3/mm (ref 0.2–1.0)
Monocyte %: 13.4 %
NEUTROS ABS: 2.5 x10 3/mm (ref 1.4–6.5)
Neutrophil %: 54.1 %
PLATELETS: 149 x10 3/mm — AB (ref 150–440)
RBC: 3.6 10*6/uL — ABNORMAL LOW (ref 4.40–5.90)
RDW: 15.8 % — AB (ref 11.5–14.5)
WBC: 4.6 x10 3/mm (ref 3.8–10.6)

## 2014-11-13 LAB — COMPREHENSIVE METABOLIC PANEL
ALT: 29 U/L (ref 14–63)
Albumin: 3.3 g/dL — ABNORMAL LOW (ref 3.4–5.0)
Alkaline Phosphatase: 52 U/L (ref 46–116)
Anion Gap: 8 (ref 7–16)
BILIRUBIN TOTAL: 0.6 mg/dL (ref 0.2–1.0)
BUN: 13 mg/dL (ref 7–18)
CALCIUM: 8.8 mg/dL (ref 8.5–10.1)
Chloride: 103 mmol/L (ref 98–107)
Co2: 29 mmol/L (ref 21–32)
Creatinine: 1.14 mg/dL (ref 0.60–1.30)
EGFR (African American): >60
Glucose: 96 mg/dL (ref 65–99)
OSMOLALITY: 279 (ref 275–301)
POTASSIUM: 3.7 mmol/L (ref 3.5–5.1)
SGOT(AST): 12 U/L — ABNORMAL LOW (ref 15–37)
SODIUM: 140 mmol/L (ref 136–145)
TOTAL PROTEIN: 6.1 g/dL — AB (ref 6.4–8.2)

## 2014-11-13 LAB — MAGNESIUM: Magnesium: 1.6 mg/dL — ABNORMAL LOW

## 2014-11-16 LAB — KAPPA/LAMBDA FREE LIGHT CHAINS (ARMC)

## 2014-11-16 LAB — PROT IMMUNOELECTROPHORES(ARMC)

## 2014-12-03 DIAGNOSIS — I071 Rheumatic tricuspid insufficiency: Secondary | ICD-10-CM | POA: Insufficient documentation

## 2014-12-03 DIAGNOSIS — R0681 Apnea, not elsewhere classified: Secondary | ICD-10-CM | POA: Insufficient documentation

## 2014-12-03 DIAGNOSIS — I480 Paroxysmal atrial fibrillation: Secondary | ICD-10-CM | POA: Insufficient documentation

## 2014-12-03 DIAGNOSIS — I340 Nonrheumatic mitral (valve) insufficiency: Secondary | ICD-10-CM | POA: Insufficient documentation

## 2014-12-08 ENCOUNTER — Ambulatory Visit: Payer: Self-pay | Admitting: Hematology and Oncology

## 2014-12-08 ENCOUNTER — Ambulatory Visit
Admit: 2014-12-08 | Disposition: A | Discharge status: Home / Self Care | Payer: Self-pay | Attending: Oncology | Admitting: Oncology

## 2015-01-08 ENCOUNTER — Ambulatory Visit
Admit: 2015-01-08 | Disposition: A | Discharge status: Home / Self Care | Payer: Self-pay | Attending: Oncology | Admitting: Oncology

## 2015-01-18 LAB — COMPREHENSIVE METABOLIC PANEL
Albumin: 4.1 g/dL
Alkaline Phosphatase: 46 U/L
Anion Gap: 10 (ref 7–16)
BUN: 26 mg/dL — ABNORMAL HIGH
Bilirubin,Total: 1.1 mg/dL
Calcium, Total: 9.4 mg/dL
Chloride: 100 mmol/L — ABNORMAL LOW
Co2: 24 mmol/L
Creatinine: 1.23 mg/dL
EGFR (African American): >60
EGFR (Non-African Amer.): 60 — ABNORMAL LOW
Glucose: 162 mg/dL — ABNORMAL HIGH
Potassium: 4.2 mmol/L
SGOT(AST): 23 U/L
SGPT (ALT): 23 U/L
Sodium: 134 mmol/L — ABNORMAL LOW
Total Protein: 6.5 g/dL

## 2015-01-18 LAB — CBC CANCER CENTER
Basophil #: 0 x10 3/mm (ref 0.0–0.1)
Basophil %: 0.7 %
Eosinophil #: 0 x10 3/mm (ref 0.0–0.7)
Eosinophil %: 0 %
HCT: 37.7 % — ABNORMAL LOW (ref 40.0–52.0)
HGB: 12.8 g/dL — ABNORMAL LOW (ref 13.0–18.0)
Lymphocyte #: 0.3 x10 3/mm — ABNORMAL LOW (ref 1.0–3.6)
Lymphocyte %: 4.7 %
MCH: 33.9 pg (ref 26.0–34.0)
MCHC: 34 g/dL (ref 32.0–36.0)
MCV: 100 fL (ref 80–100)
Monocyte #: 0.6 x10 3/mm (ref 0.2–1.0)
Monocyte %: 9.2 %
Neutrophil #: 5.5 x10 3/mm (ref 1.4–6.5)
Neutrophil %: 85.4 %
Platelet: 176 x10 3/mm (ref 150–440)
RBC: 3.78 10*6/uL — ABNORMAL LOW (ref 4.40–5.90)
RDW: 16 % — ABNORMAL HIGH (ref 11.5–14.5)
WBC: 6.5 x10 3/mm (ref 3.8–10.6)

## 2015-01-18 LAB — MAGNESIUM: Magnesium: 1.9 mg/dL

## 2015-01-19 LAB — KAPPA/LAMBDA FREE LIGHT CHAINS (ARMC)

## 2015-01-19 LAB — PROT IMMUNOELECTROPHORES(ARMC)

## 2015-01-29 NOTE — Consult Note (Signed)
History of Present Illness:  Reason for Consult Subjective: Chief Complaint/Diagnosis:  1. Multiple myeloma diagnosis in May of 2008 status post treatment with Velcade, thalidomide, Decadron, followed by high dose chemotherapy and stem cell support. 2. Chronic Coumadin therapy for atrial fibrillation. 3. Gastroesophageal reflux disease and high cholesterol 4. Herpes zoster mid back areain July of 2009 5. Rising  M spike  suggestive of  progressive myeloma(M spike was 2.7 repeat M spike (June of 2011) is 1.7 6. Started on Revlamid in August of 2011.15 mg by mouth daily for 3 weeks followed by one week off  Decadron 40 mg on day one, 8, 15, 22 7. Because of significant side effect Decadron has been decreased to 10 mg once a week (October, 2011) 8. 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 On maintenance Revlamid 10 mg 3 weeks on one week off 10. Changed now to Revlamid 10 mg 2 weeks on 2 weeks off 11.Decadron 10 mg once a week has been added   HPI   Patient was admitted in the hospital with right-sided chest pain and acute pulmonary embolism.  He is on Revlimid at present time for recurrent multiple myeloma.is extremely severe.  Is more meds he takes deep breath.  Started on   heparin intravenous.  PFSH:  Family History noncontributory   Additional Past Medical and Surgical History does not smoke. Does not drink unemployed at this point in time Chronic back pain   Review of Systems:  General weakness  pain   Performance Status (ECOG) 1   HEENT no complaints   Lungs as per HPI   Cardiac no complaints   GI no complaints   GU no complaints   Musculoskeletal no complaints   Extremities no complaints   Skin no complaints   Neuro no complaints   Psych no complaints   NURSING NOTES: **Vital Signs.:   19-Aug-14 08:56   Vital Signs Type: Pre Medication   Temperature Source: oral   Pulse Pulse: 86   Systolic BP Systolic BP: 329   Diastolic  BP (mmHg) Diastolic BP (mmHg): 73   Mean BP: 94   Pulse Ox % Pulse Ox %: 90   Pulse Ox Activity Level: At rest   Oxygen Delivery: 2L    13:59   Vital Signs Type: Post Fall   Celsius: 37.7   Temperature Source: oral   Pulse Pulse: 92   Respirations Respirations: 20   Systolic BP Systolic BP: 518   Diastolic BP (mmHg) Diastolic BP (mmHg): 66   Mean BP: 78   Pulse Ox % Pulse Ox %: 93   Pulse Ox Activity Level: At rest   Oxygen Delivery: 2L   Physical Exam:  General Patient is lying in the bed and somewhat in distress because of pain   HEENT: normal   Lungs: Coarse crepitation on the right side   Cardiac: regular rate, rhythm   Abdomen: soft  nontender  positive bowel sounds   Skin: intact   Extremities: No edema, rash or cyanosis   Neuro: AAOx3   Psych: normal appearance   Physical Exam LYMPHATICS:   No cervical, axillary, or inguinal lymphadenopathy     Compression fractures:    multiple myleoma:    Diverticular Disease:    Anxiety:    GERD - Esophageal Reflux:    Atrial Fibrillation:    Hyperlipidemia:    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   Mutiple Myeloma:    antigen E alert for  blood transfusions:    L4L5 Laminectomy with Diskectomy:    Left Knee Arthroscopy:    Bone Marrow Biopsy:    Zometa: Other    dexamethasone: 10 milligram(s) orally once a week, Status: Active, Quantity: 8, Refills: None   Xanax 0.25 mg oral tablet: 1 tab(s) orally every 8 hours, As Needed, Status: Active, Quantity: 90, Refills: None   Revlimid 10 mg capsule: 1 cap(s) orally once a day x 21 days , then off for 7 days, Status: Active, Quantity: 21, Refills: 3   baclofen 10 mg tablet: 1 to 2 tab(s) orally 1 to 2 times a day x 30 days, Status: Active, Quantity: 120, Refills: PRN   Klor-Con M20 20 mEq tablet, extended release: 1 tab(s) orally 2 times a day x 90 days, Status: Active, Quantity: 180, Refills: 3   aspirin 81 mg oral tablet: 1  tab(s) orally once a day (stopped for surgery), Status: Active, Quantity: 0, Refills: None   Cardizem CD 120 mg/24 hours oral capsule, extended release: 1  orally once a day am, Status: Active, Quantity: 0, Refills: None   Centrum Silver oral tablet, chewable: 1  orally once a day , Status: Active, Quantity: 0, Refills: None   tamsulosin 0.4 mg oral capsule: 1 cap(s) orally once a day, Status: Active, Quantity: 0, Refills: None   Zantac 150 oral tablet: 1 tab(s) orally 2 times a day, As Needed, Status: Active, Quantity: 0, Refills: None   naproxen 375 mg (as sodium) oral tablet, extended release: 2 tab(s) orally once a day, Status: Active, Quantity: 0, Refills: None   IT pump: Bupivicaine ,Fentanyl and Clonidine, Status: Active, Quantity: 0, Refills: None   Lipitor 10 mg oral tablet: 1 tab(s) orally once a day , Status: Active, Quantity: 0, Refills: None   Vitamin D3 2000 intl units oral capsule: 1 cap(s) orally once a day, Status: Active, Quantity: 0, Refills: None   Imodium A-D 2 mg oral tablet: tab(s) orally , As Needed, Status: Active, Quantity: 0, Refills: None   omeprazole 20 mg oral delayed release capsule: 1 cap(s) orally once a day, Status: Active, Quantity: 0, Refills: None  Laboratory Results: Hepatic:  18-Aug-14 19:59   Bilirubin, Total 0.8  Alkaline Phosphatase 82  SGPT (ALT) 30  SGOT (AST)  11  Total Protein, Serum 6.8  Albumin, Serum 3.4  Cardiology:  18-Aug-14 20:07   Ventricular Rate 74  Atrial Rate 74  P-R Interval 204  QRS Duration 84  QT 372  QTc 412  P Axis 109  R Axis 47  T Axis 31  ECG interpretation Normal sinus rhythm Normal ECG When compared with ECG of 10-Nov-2011 05:58, No significant change was found ----------unconfirmed---------- Confirmed by OVERREAD, NOT ([id number]), editor PEARSON, BARBARA ([id number]) on 05/27/2013 10:11:27 AM  Routine Chem:  18-Aug-14 19:59   Glucose, Serum 88  BUN  28  Creatinine (comp)  1.37  Sodium, Serum 138  Potassium,  Serum  3.4  Chloride, Serum 105  CO2, Serum 29  Calcium (Total), Serum 8.7  Osmolality (calc) 281  eGFR (African American) >60  eGFR (Non-African American)  53 (eGFR values <6m/min/1.73 m2 may be an indication of chronic kidney disease (CKD). Calculated eGFR is useful in patients with stable renal function. The eGFR calculation will not be reliable in acutely ill patients when serum creatinine is changing rapidly. It is not useful in  patients on dialysis. The eGFR calculation may not be applicable to patients at the low and high extremes of body sizes, pregnant  women, and vegetarians.)  Anion Gap  4  Cardiac:  18-Aug-14 19:59   Troponin I < 0.02 (0.00-0.05 0.05 ng/mL or less: NEGATIVE  Repeat testing in 3-6 hrs  if clinically indicated. >0.05 ng/mL: POTENTIAL  MYOCARDIAL INJURY. Repeat  testing in 3-6 hrs if  clinically indicated. NOTE: An increase or decrease  of 30% or more on serial  testing suggests a  clinically important change)  Routine Hem:  18-Aug-14 19:59   WBC (CBC) 5.8  RBC (CBC)  3.49  Hemoglobin (CBC)  12.7  Hematocrit (CBC)  35.7  Platelet Count (CBC)  116 (Result(s) reported on 26 May 2013 at 08:27PM.)  MCV  102  MCH  36.5  MCHC 35.7  RDW  15.0   Radiology Results: [place]:    19-Aug-14 13:40, [place] Color Flow Doppler Low Extrem Bilat (Legs)  [place] Color Flow Doppler Low Extrem Bilat (Legs)   REASON FOR EXAM:    leg cramp and evidence of pulmonary embolism  COMMENTS:       PROCEDURE: [place]  - US DOPPLER LOW EXTR BILATERAL  - May 27 2013  1:40PM     RESULT: Duplex Doppler interrogation of the deep venous system of both   legs from the inguinal to the popliteal region demonstrates the deep   venous systems are fully compressible throughout. The color Doppler and   spectral Doppler appearance is normal. There is normal response to distal   augmentation. The color Doppler images show no filling defect.    IMPRESSION:    1. No evidence of DVT in either lower  extremity.    Dictation Site: 2    Verified By: Sundra Aland, M.D., MD  LabUnknown:    19-Aug-14 01:14, CT Chest for Pulm Embolism With Contrast  PACS Image     19-Aug-14 13:40, [place] Color Flow Doppler Low Extrem Bilat (Legs)  PACS Image   CT:    19-Aug-14 01:14, CT Chest for Pulm Embolism With Contrast  CT Chest for Pulm Embolism With Contrast   REASON FOR EXAM:    cp sob  COMMENTS:       PROCEDURE: CT  - CT CHEST (FOR PE) W  - May 27 2013  1:14AM     RESULT:     Technique:  Helical 3 mm sections were obtained from the thoracic inlet   through the lung bases status post intravenous administration of 80 ml of   Isovue-370.    Findings:  Evaluation of the mediastinum and hilar regions and structures   demonstrates no evidence of mediastinal or hilar masses or adenopathy. A   filling defect is identified extending from the proximal portion of the   right lower lobe pulmonary artery into segmental and subsegmental     branches.    The lung parenchyma demonstrates ill-defined areas of increased density   within the posterior base of the right lower lobe and to a lesser extent   within thelateral base of the left lower lobe. There is thickening of   the interlobular septa and within the lung bases subpleural septal   thickening. Mild ground-glass density is identified within the lung bases.    The visualized upper abdominal viscera demonstrate low attenuating foci   scattered throughout the liver. The remaining visualized upper abdominal   viscera are unremarkable.    The osseous structures demonstrate multiple lytic foci particularly   within the appendicular skeleton. Clinical correlation is recommended.  IMPRESSION:      1.  Pulmonary arterial  embolic disease as described above involving the   right lower lobe pulmonary artery with extension into segmental and   subsegmental branches. There are findings within the right lung base   which may represent the sequelae of  areas of infarcted lung versus   infiltrate and/or atelectasis. An underlying component of pulmonary   fibrosis is also of diagnostic consideration.  2.  Findings within the left lung base and differential considerations   again are atelectasis versus infiltrate overlying a component of   pulmonary fibrosis.  3.  Cysts within the liver.  4.  Lucencies within the osseous structures and clinical correlation   recommended. An etiology such as multiple myeloma is of diagnostic   consideration particularly if clinically appropriate.   5.  Sabino Snipes, PA of the Emergency Department was informed of these   findings via a preliminary faxed report.      Thank you for this opportunity to contribute to the care of your patient.         Verified By: Mikki Santee, M.D., MD   Assessment and Plan: Impression:    1.acute pulmonary embolismis at high risk for pulmonary embolism because of multiple myeloma and on Revlimid.the pain is under control due to start patient on XARELTO .  7 days   treatment sample was given to himpatient would continue 15 mg twice a day for total 21 days followed by 20 mg by mouth daily. scan also shows some bony lesionshad slowly progressing recurrent multiple myeloma and may require a change in the treatment region be discuss after repeating SIEP any light chain as outpatient. situation with patient and family members about overall prognosis outcome and treatment consideration for 30 minutes or more  Plan:   //////////  Electronic Signatures: Armando Lauman, Martie Lee (MD)  (Signed 19-Aug-14 16:39)  Authored: HISTORY OF PRESENT ILLNESS, PFSH, ROS, NURSING NOTES, PE, PAST MEDICAL HISTORY, ALLERGIES, HOME MEDICATIONS, LABS, OTHER RESULTS, ASSESSMENT AND PLAN   Last Updated: 19-Aug-14 16:39 by Jobe Gibbon (MD)

## 2015-01-29 NOTE — Discharge Summary (Signed)
PATIENT NAME:  Logan Perez, Logan Perez MR#:  [id number] DATE OF BIRTH:  06-10-1946  DATE OF ADMISSION:  05/27/2013 DATE OF DISCHARGE:  05/29/2013  ADMITTING PHYSICIAN:  Dr. Lenore Manner.  DISCHARGING PHYSICIAN:  Dr. Gladstone Lighter.  PRIMARY ONCOLOGIST:  Dr. Oliva Bustard.  PRIMARY CARE PHYSICIAN:  Dr. Domenick Gong.  [place]:  Oncology consultation with Dr. Oliva Bustard.   DISCHARGE DIAGNOSES:  1.  Pulmonary emboli, right lower lobe segmental branches.  2.  Multiple myeloma.   3.  Hypertension.  4.  Hyperlipidemia.  5.  Chronic back pain secondary to vertical compression fracture, status post pain pump.  6.  Paroxysmal atrial fibrillation.  7.  Diverticulosis.  8.  Benign prostatic hypertrophy.  9.  Anxiety disorder.   DISCHARGE HOME MEDICATIONS:  1.  Xarelto 50 mg p.o. b.i.d. for 18 more days and then changed over to 20 mg p.o. daily.  2.  Aspirin 81 mg p.o. daily.  3.  Cardizem 120 mg p.o. daily.  4.  Cardizem 60 mg p.o. q.6h. p.r.n. for tachycardia, heart rate greater than 120.  5.  Centrum Silver 1 tablet p.o. daily.  6.  Lipitor 10 mg p.o. daily.  7.  Vitamin D3 2000 international units daily.  8.  Flomax 0.4 mg p.o. daily.  9.  Klor-Con 20 mEq p.o. b.i.d.  10.  Zantac 150 mg p.o. b.i.d.  11.  Pain pump with bupivacaine, fentanyl and clonidine as managed per Pain Management.  12.  Imodium 2 tablets p.o. p.r.n. q.6 hours for diarrhea.  13.  Decadron 10 mg once a week.  14.  Prilosec 20 mg p.o. daily.  15.  Dilaudid 4 mg p.o. q.6h. p.r.n. for pain.  16.  Colace 100 mg p.o. b.i.d.   DISCHARGE HOME OXYGEN:  None.   DISCHARGE DIET:  Low-sodium diet.   DISCHARGE ACTIVITY:  As tolerated.    FOLLOWUP INSTRUCTIONS:  Follow up with Dr. Oliva Bustard in 1 to 2 weeks.   LABS AND IMAGING STUDIES:  WBC 4.1, hemoglobin 10.4, hematocrit 29.7, platelet count 108.   Sodium 136, potassium 3.7, chloride 105, bicarb 22, BUN 14, creatinine 1.1, glucose 86, calcium of 8.2.   Ultrasound  Dopplers bilateral lower extremity showing evidence of DVT in both lower extremity. INR is 1.1.   CT of the chest for pulmonary embolism showing pulmonary arterial emboli as noted in the right lower lobe pulmonary artery with extension into segmental and subsegmental branches. Right lung base changes are present as a result of infarcted lung versus infiltrate or atelectasis. Pulmonary fibrosis is also diagnostic consideration. Left lung with atelectasis is present and pulmonary fibrosis is within the liver and osseous lucencies noted secondary to multiple myeloma.   BRIEF HOSPITAL COURSE:  Logan Perez is a very pleasant, very active at baseline, 69 year old male with past medical history significant for multiple myeloma being treated with Revlimid by Dr. Oliva Bustard, comes to the hospital secondary to the right-sided pleuritic chest pain and also worsening dyspnea on exertion and CT of the chest revealing pulmonary emboli.  1.  Pulmonary emboli, likely secondary to underlying multiple myeloma and also Revlimid which can increase a risk of clots. The patient had right lower lobe segment and subsegmental branches emboli and was started on IV heparin while admitted to the hospital, changed over to [place]. He tolerated the medication well and he is being discharged on the same. He had significant for pleuritic chest pain requiring IV Dilaudid for the initial couple of days in the hospital course, changed  over to p.o. Dilaudid, tolerated well and he is being discharged home.  2.  Multiple myeloma followed by Dr. Oliva Bustard while in the hospital, possible new osseous lesions were noted on the CT of the chest, and the patient will need a serum protein electrophoresis as an outpatient for followup of his disease. For now continue Decadron and the Revlimid. 3.  Paroxysmal atrial fibrillation. Remained in sinus rhythm while in the hospital except a couple of episodes where he flipped back to atrial fibrillation. The patient  does not tolerate metoprolol well so Cardizem 60 mg p.o. q.6h. p.r.n. was started, and he responded well, and he used to take that as an outpatient in the past also so prescription was dispensed for the same. He will need to follow up with PCP in 1 to 2 weeks for the same.  4.  Chronic back pain secondary to vertebral compression fractures. The patient has a pain pump and has been using it for a while. 5.  His course has been otherwise uneventful in the hospital.   DISCHARGE CONDITION:  Stable.   DISCHARGE DISPOSITION:  Home.   TIME SPENT ON DISCHARGE:  40 minutes.    ____________________________ Gladstone Lighter, MD rk:jm D: 05/29/2013 13:13:14 ET T: 05/29/2013 16:28:14 ET JOB#: [id number]  cc: Gladstone Lighter, MD, <Dictator> Janak K. Oliva Bustard, MD Fonnie Jarvis Ilene Qua, MD Gladstone Lighter MD ELECTRONICALLY SIGNED 06/20/2013 15:53

## 2015-01-29 NOTE — H&P (Signed)
PATIENT NAME:  Logan Perez, Logan Perez MR#:  [id number] DATE OF BIRTH:  [birth year]  DATE OF ADMISSION:  05/27/2013  PRIMARY CARE PHYSICIAN: Domenick Gong, MD  ONCOLOGIST: Forest Gleason, MD  REFERRING PHYSICIAN: Harvest Dark, MD / Sabino Snipes, PA   CHIEF COMPLAINT: Right-sided chest pain.   HISTORY OF PRESENT ILLNESS: Logan Perez is a 69 year old pleasant Caucasian male with history of multiple myeloma followed up by Dr. Oliva Bustard. He was in his usual state of health and yesterday he had routine echocardiogram done in the morning which went well. After that he felt some mild right-sided chest pain that he ignored; however, by noon time started to increase in intensity and throughout the day got worse to the extent that the pain reached severity of 10 on a scale of 10, worse upon taking deep breath. The quality of the pain is sharp in nature. He is short of breath in terms of he has to splint and avoid taking a deep breath. Finally, he came to the Emergency Department seeking evaluation and CAT scan here revealed evidence of pulmonary embolism. The patient received a bolus of intravenous heparin and placed on IV heparin drip protocol.   REVIEW OF SYSTEMS: CONSTITUTIONAL: Denies having any fever. No chills. No fatigue.  EYES: No blurring of vision. No double vision.  ENT: No hearing impairment. No sore throat. No dysphagia.  CARDIOVASCULAR: Reports the right-sided chest pain located on the right side of his chest, pleuritic in nature. Mild shortness of breath. No edema.  RESPIRATORY: Mild shortness of breath and chest pain as described above. No hemoptysis.  GASTROINTESTINAL: No abdominal pain. No vomiting. No diarrhea. No melena or hematochezia.  GENITOURINARY: No dysuria. No frequency of urination.  MUSCULOSKELETAL: He has chronic back pain from compression fracture and multiple myeloma. No joint swelling. No muscular pain or swelling, other than noticing some muscular cramping in both calf muscles  over the last 1 to 2 months. This is not consistent. It was on and off and it was mild.  INTEGUMENTARY: No skin rash. No ulcers.  NEUROLOGY: No focal weakness. No seizure activity. No headache.  PSYCHIATRY: No anxiety or depression.  ENDOCRINE: No polyuria or polydipsia. No heat or cold intolerance.   PAST MEDICAL HISTORY:  1.  Multiple myeloma. Followed up by Dr. Oliva Bustard. 2.  Systemic hypertension. 3.  Hyperlipidemia. 4.  Gastroesophageal reflux disease. 5.  Chronic back pain and history of compression fractures.  6.  Anxiety disorder.  7.  Benign prostatic hypertrophy. 8.  Diverticulosis.  9.  Atrial fibrillation but converted to sinus rhythm.   PAST SURGICAL HISTORY: 1.  Lumbar spine laminectomy. 2.  Left knee arthroscopy. 3.  Bone marrow biopsy.  4.  Implanted Fentanyl pump.  SOCIAL HABITS: Nonsmoker. He has remote history of smoking, but he quit about 35 years ago. He drinks beer once in a while or occasionally.   FAMILY HISTORY: His father died from complication of throat cancer at age of 45. His mother died at the age of 34 after having multiple strokes.   SOCIAL HISTORY: He is married, living with his wife. 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    ADMISSION MEDICATIONS:  1.  Zantac 150 mg twice a day. 2.  Xanax 0.25 mg q. 8 hours p.r.n. 3.  Vitamin D3 2000 units once a day. 4.  Tamsulosin 0.4 mg once a day. 5.  Revlimid 10 mg once a day. 6.  Omeprazole 20  mg a day. 7.  Naproxen 375 mg 2 tablets a day. 8.  Lipitor 10 mg a day. 9.  Potassium 20 mEq twice a day.  10.  Fentanyl pump. 11.  Dexamethasone 10 mg once a week.  12.  Multivitamin once a day. 13.  Cardizem CD 120 mg once a day. 14.  Baclofen 10 mg 1 to 2 tablets twice a day, but this is used p.r.n. 15.  Aspirin 81 mg a day.   ALLERGIES: ZOMETA.  PHYSICAL EXAMINATION: VITAL SIGNS: Blood pressure 113/63, respiratory rate 20, pulse 72, temperature 98.5. Oxygen  saturation 97%.  GENERAL APPEARANCE: Elderly male lying in bed in no acute distress.  HEAD: No pallor. No icterus. No cyanosis.  EARS:  Normal hearing. No discharge. No lesions.  NOSE:  No discharge. No bleeding. No ulcers.  OROPHARYNX: Normal lips and tongue. No oral thrush. No ulcers. No exudate.  EYES: Normal eyelids and conjunctivae. Pupils about 3 to 4 mm, round, equal, sluggishly reactive to light.  NECK: Supple. Trachea at midline. No thyromegaly. No cervical lymphadenopathy. No masses.  HEART: Normal S1, S2. No S3, S4. No murmur. No gallop. No carotid bruits.  LUNGS: Normal breathing pattern without use of accessory muscles. No rales. No wheezing.  ABDOMEN: Soft without tenderness. No hepatosplenomegaly. No masses. No hernias.  SKIN: No ulcers. No subcutaneous nodules.  NEUROLOGIC: Cranial nerves II through XII were intact. No focal motor deficit.  PSYCHIATRIC: The patient is alert and oriented x 3. Mood and affect were normal.   LABORATORY AND DIAGNOSTICS: CTA of the chest revealed evidence of pulmonary embolism as central as the right lower lobe pulmonary artery. There is heterogenous right lower lobe opacity. May represent infarct or partial atelectasis or fibrosis. There is diffuse peripheral reticular opacities consistent with interstitial lung disease. There are wide spread osseous lucent lesions consistent with multiple myeloma.   His EKG showed normal sinus rhythm at rate of 74 per minute and unremarkable EKG.  Serum glucose 88, BUN 28, creatinine 1.3, sodium 138, potassium 3.4, calcium 8.7, albumin 3.4, bilirubin 0.8, AST 11, ALT 30. Troponin less than 0.02. CBC showed white count of 5000, hemoglobin 12, hematocrit 35, platelet count 116. Prothrombin time 14. INR 1. APTT 24.   ASSESSMENT: 1.  Pulmonary embolism.  2.  Multiple myeloma.  3.  Mild hypokalemia.  4.  Mild thrombocytopenia.  5.  Systemic hypertension.  6.  Hyperlipidemia.  7.  History of lumbar spine  compression fractures.  PLAN: We will admit the patient to the medical floor with telemetry monitoring. Anticoagulation started with intravenous heparin. I will order Doppler ultrasound of lower extremities since he reported some cramping in his calf muscles on and off for the last 1 month or so. Consult Dr. Oliva Bustard since he is familiar with his condition. I will continue his home medications as listed above; however, I will hold the Naprosyn. I try not to combine nonsteroidal anti-inflammatory medications along with IV heparin. Continue proton pump inhibitor, omeprazole. Potassium supplementation to correct the hypokalemia. For deep vein thrombosis prophylaxis, he is already on full anticoagulation with heparin for active deep vein thrombosis and pulmonary embolism. The patient indicates that he does have a Living Will. He states his code status is FULL CODE, unless his condition deteriorated and he was in vegetative state or incurable then he does not want to be placed on life support indefinitely.   TIME SPENT: In evaluating this patient took more than 55 minutes. ____________________________ [place] Pu. Lenore Manner, MD amd:sb  D: 05/27/2013 04:24:07 ET T: 05/27/2013 08:27:36 ET JOB#: [id number]  cc: [place] Pu. Lenore Manner, MD, <Dictator> Mike Craze Irven Coe MD ELECTRONICALLY SIGNED 05/28/2013 1:50

## 2015-01-31 NOTE — H&P (Signed)
PATIENT NAME:  Logan Perez, Logan Perez MR#:  [id number] DATE OF BIRTH:  1945/11/10  DATE OF ADMISSION:  11/07/2011  REFERRING PHYSICIAN: Lurline Hare, MD  FAMILY PHYSICIAN: Domenick Gong, MD  ONCOLOGIST: Forest Gleason, MD  REASON FOR ADMISSION: Acute onset pleuritic chest pain associated with low-grade fever.   HISTORY OF PRESENT ILLNESS: The patient is a 69 year old male with a history of multiple myeloma, followed by Dr. Oliva Bustard, who presents with acute onset of right lower chest and rib pain associated with low-grade fever. Some shortness of breath. Minimal cough. In the Emergency Room, the patient's chest CT without contrast suggested right lower lobe pneumonia. He is mildly hypoxic. He is now admitted for further evaluation.   PAST MEDICAL HISTORY:  1. Multiple myeloma.  2. Diverticulosis.  3. Benign hypertension.  4. Hyperlipidemia.  5. Benign prostatic hypertrophy.  6. Gastroesophageal reflux disease.  7. Anxiety.  8. History of compression fractures.   MEDICATIONS:  1. Zantac 150 mg p.o. twice a day. 2. Xanax 0.25 mg p.o. every six hours p.r.n. anxiety.  3. Vitamin D3 2000 units p.o. daily.  4. Flomax 0.4 mg p.o. daily.  5. Revlimid 10 mg p.o. daily.  6. Prilosec 20 mg p.o. daily.  7. Lipitor 10 mg p.o. daily.  8. K-Dur 20 milliequivalents p.o. twice a day. 9. Cardizem CD 120 mg p.o. daily.  10. Baclofen 10 mg p.o. at bedtime. 11. Aspirin 81 mg p.o. daily.   ALLERGIES: Zometa.  SOCIAL HISTORY: Negative for alcohol or tobacco abuse.   FAMILY HISTORY: Positive for hypertension, but otherwise unremarkable.  REVIEW OF SYSTEMS: CONSTITUTIONAL: The patient has had fever but no change in weight. EYES: No blurred or double vision. No glaucoma. ENT: No tinnitus or hearing loss. No nasal discharge or bleeding. No difficulty swallowing. RESPIRATORY: Some cough but no wheezing. Denies hemoptysis. Respirations are painful. CARDIOVASCULAR: Chest pain which is pleuritic in nature, as per  history of present illness. No orthopnea or palpitations. No syncope. GI: No nausea, vomiting, or diarrhea. No abdominal pain. No change in bowel habits. GU: No dysuria or hematuria. No incontinence. ENDOCRINE: No polyuria or polydipsia. No heat or cold intolerance. HEMATOLOGIC: The patient has chronic anemia but denies easy bruising or bleeding. LYMPHATIC: No swollen glands. MUSCULOSKELETAL: The patient denies pain in his neck, back, shoulders, knees, or hips. No gout. NEUROLOGIC: No numbness or weakness. Denies migraines, stroke, or seizures. PSYCH: The patient denies anxiety, insomnia, or depression.   PHYSICAL EXAMINATION:   GENERAL: The patient is in no acute distress.   VITAL SIGNS: Vital signs are remarkable for a blood pressure of 93/56 with a heart rate of 78 and a respiratory rate of 18. He is currently afebrile.   HEENT: Normocephalic, atraumatic. Pupils equally round and reactive to light and accommodation. Extraocular movements are intact. Sclerae are anicteric. Conjunctivae are clear. Oropharynx is clear.   NECK: Supple without jugular venous distention or bruits. No adenopathy or thyromegaly is noted.   LUNGS: Decreased breath sounds at the right base. No wheezes or rales. Respiratory effort is mildly increased.   CARDIAC: Regular rate and rhythm with normal S1 and S2. No significant rubs, murmurs, or gallops. PMI is nondisplaced. Chest wall is nontender.   ABDOMEN: Soft and nontender, with normoactive bowel sounds. No organomegaly or masses were appreciated. No hernias or bruits were noted.   EXTREMITIES: No clubbing, cyanosis, or edema. Pulses were 2+ bilaterally.   SKIN: Warm and dry without rash or lesions.   NEUROLOGIC: Cranial nerves II through  XII grossly intact. Deep tendon reflexes were symmetric. Motor and sensory examination is nonfocal.   PSYCH: Alert and oriented to person, place, and time. He was cooperative and used good judgment.   LABS/STUDIES:  EKG revealed  sinus rhythm with no acute ischemic changes.   CT scan of the chest done without contrast was remarkable for osteopenia and right lower lobe infiltrate.   ABG revealed a pO2 of 53 on room air with a saturation of 89%.   White count was 6.0 with a hemoglobin of 12.3 and a platelet count of 114,000. Glucose was 108 with a BUN of 15 and a creatinine of 1.23 with a sodium of 141 and a potassium of 3.9.   ASSESSMENT:  1. Presumed right lower lobe pneumonia.  2. Pleuritic chest pain.  3. Multiple myeloma.  4. Anemia of chronic disease.   PLAN: The patient will be admitted to the floor with oxygen, IV steroids, IV antibiotics, and Xopenex and Atrovent SVNs. We will send off a d-dimer and obtain a V/Q scan to rule out pulmonary embolism. We will continue aspirin and add Lovenox while in the hospital. We will continue his other outpatient regimen. We will consult Dr. Oliva Bustard in regards to his multiple myeloma. We will wean oxygen as tolerated and follow up chest x-ray within 24 to 36 hours. Morphine as needed for pain control. Further treatment and evaluation will depend upon the patient's progress.   TOTAL TIME SPENT: 50 minutes.  ____________________________ Leonie Douglas Doy Hutching, MD jds:slb D: 11/07/2011 04:10:11 ET T: 11/07/2011 08:23:19 ET JOB#: [id number]  cc: Leonie Douglas. Doy Hutching, MD, <Dictator> Fonnie Jarvis. Ilene Qua, MD JEFFREY Lennice Sites MD ELECTRONICALLY SIGNED 11/07/2011 11:27

## 2015-01-31 NOTE — Discharge Summary (Signed)
PATIENT NAME:  Logan Perez, Logan Perez MR#:  [id number] DATE OF BIRTH:  Feb 10, 1946  DATE OF ADMISSION:  11/07/2011 DATE OF DISCHARGE:  11/07/2011  PRESENTING COMPLAINT: Shortness of breath and hypoxia with pleuritic chest pain.   DISCHARGE DIAGNOSES:  1. Basilar pneumonia.  2. Multiple myeloma.  3. Pleuritic chest pain secondary to pneumonia, resolved.  4. Diverticular disease.  5. Anxiety.   CONDITION ON DISCHARGE: Fair. Vitals stable. Blood pressure 119/70, sats 99% on room air and exertion.   LABORATORY, DIAGNOSTIC AND RADIOLOGICAL DATA:  Lung V/Q scan is low probability for pulmonary embolus. D-dimer 0.44.   Chest CT without contrast shows bibasilar pulmonary infiltrates, particularly on the right, right basilar infiltrate is peripheral. Diffuse osteopenia consistent with patient's history of myeloma. Multiple thoracolumbar compression fractures present. pH 7.49, pCO2 32, pO2 53 on FiO2 of room air. Chest x-ray AP, lateral shows mild heterogenous opacities at the right costophrenic angle. May represent infection or atelectasis.   White count 6.0, hemoglobin and hematocrit 12.3 and 36.5, platelet count 114.   Basic metabolic panel within normal limits. Troponin 0.02.   MEDICATIONS ON DISCHARGE:  1. Levaquin 750 mg p.o. daily for five days.  2. Klor-Con 20 mEq p.o. b.i.d.  3. Baclofen 10 mg 1 to 2 tablets once a day at bedtime.  4. Aspirin 81 mg daily.  5. Cardizem CD 120 mg extended release p.o. daily.  6. Centrum Silver p.o. daily.  7. Tamsulosin 0.4 mg p.o. daily.  8. Omeprazole 20 mg daily.  9. Zantac 150 mg b.i.d.  10. Lipitor 10 mg daily.  11. Revlimid 10 mg p.o. daily.  12. Vitamin D3 2000 international units p.o. daily.  13. Xanax 0.25 p.o. daily as needed.   DIET: Low sodium diet.   FOLLOW UP:  1. Follow up with Dr. Ilene Qua 11/14/2011 at 2:00 p.m.  2. Follow up with Dr. Oliva Bustard on your scheduled appointment.   BRIEF SUMMARY OF HOSPITAL COURSE: Logan Perez is a very  pleasant 70 year old Caucasian gentleman with past medical history of multiple myeloma comes into the Emergency Room with:  1. Bibasilar pneumonia. Patient presented with shortness of breath, pleuritic chest pain. He was found to be mildly hypoxic and normal WBC, no more chest pain and no cough. He is ambulating well with room air sats of 99% on exertion. Patient was started on IV Rocephin and Zithromax, now changed to p.o. Levaquin 750 mg for five more days. He is improved and is requesting to go home. Will discharge to home. Follow up with Dr. Ilene Qua and Dr. Oliva Bustard as outpatient.  2. Hypoxemia secondary to pleuritic chest pain and pneumonia with shallow respirations, resolved. No indication for IV steroids. Sats were 98% on room air and exertion.  3. Multiple myeloma. Continue Revlimid.   4. Gastroesophageal reflux disease. On PPI.  5. Hospital stay remained stable. Patient remained a FULL CODE. Discharge plan discussed with patient's wife and Dr. Ma Hillock was also informed of patient going home.   TIME SPENT: 40 minutes.   ____________________________ Hart Rochester Posey Pronto, MD sap:cms D: 11/07/2011 13:37:40 ET T: 11/08/2011 09:25:53 ET  JOB#: [id number] cc: Tynlee Bayle A. Posey Pronto, MD, <Dictator> Fonnie Jarvis. Ilene Qua, [place]. Oliva Bustard, MD Ilda Basset MD ELECTRONICALLY SIGNED 11/11/2011 15:40

## 2015-02-12 ENCOUNTER — Telehealth: Payer: Self-pay | Admitting: *Deleted

## 2015-02-12 MED ORDER — DEXAMETHASONE 4 MG PO TABS: ORAL_TABLET | ORAL | Status: DC

## 2015-02-15 NOTE — Telephone Encounter (Signed)
Rx escribed.

## 2015-03-15 ENCOUNTER — Other Ambulatory Visit: Payer: Self-pay | Admitting: Hematology and Oncology

## 2015-03-29 ENCOUNTER — Other Ambulatory Visit: Payer: Self-pay | Admitting: Oncology

## 2015-04-14 ENCOUNTER — Other Ambulatory Visit: Payer: Self-pay | Admitting: Hematology and Oncology

## 2015-04-21 ENCOUNTER — Inpatient Hospital Stay: Payer: Medicare Other | Attending: Hematology and Oncology

## 2015-04-21 ENCOUNTER — Other Ambulatory Visit: Payer: Self-pay

## 2015-04-21 DIAGNOSIS — I4891 Unspecified atrial fibrillation: Secondary | ICD-10-CM | POA: Insufficient documentation

## 2015-04-21 DIAGNOSIS — K219 Gastro-esophageal reflux disease without esophagitis: Secondary | ICD-10-CM | POA: Insufficient documentation

## 2015-04-21 DIAGNOSIS — N400 Enlarged prostate without lower urinary tract symptoms: Secondary | ICD-10-CM | POA: Diagnosis not present

## 2015-04-21 DIAGNOSIS — T50995S Adverse effect of other drugs, medicaments and biological substances, sequela: Secondary | ICD-10-CM | POA: Insufficient documentation

## 2015-04-21 DIAGNOSIS — C9000 Multiple myeloma not having achieved remission: Secondary | ICD-10-CM | POA: Diagnosis present

## 2015-04-21 DIAGNOSIS — Z86711 Personal history of pulmonary embolism: Secondary | ICD-10-CM | POA: Diagnosis not present

## 2015-04-21 DIAGNOSIS — E785 Hyperlipidemia, unspecified: Secondary | ICD-10-CM | POA: Diagnosis not present

## 2015-04-21 DIAGNOSIS — I10 Essential (primary) hypertension: Secondary | ICD-10-CM | POA: Insufficient documentation

## 2015-04-21 DIAGNOSIS — M87180 Osteonecrosis due to drugs, jaw: Secondary | ICD-10-CM | POA: Insufficient documentation

## 2015-04-21 DIAGNOSIS — M549 Dorsalgia, unspecified: Secondary | ICD-10-CM | POA: Insufficient documentation

## 2015-04-21 DIAGNOSIS — Z9484 Stem cells transplant status: Secondary | ICD-10-CM | POA: Insufficient documentation

## 2015-04-21 DIAGNOSIS — Z79899 Other long term (current) drug therapy: Secondary | ICD-10-CM | POA: Insufficient documentation

## 2015-04-21 DIAGNOSIS — Z87891 Personal history of nicotine dependence: Secondary | ICD-10-CM | POA: Insufficient documentation

## 2015-04-21 DIAGNOSIS — Z7901 Long term (current) use of anticoagulants: Secondary | ICD-10-CM | POA: Diagnosis not present

## 2015-04-21 LAB — CBC WITH DIFFERENTIAL/PLATELET
Basophils Absolute: 0 10*3/uL (ref 0–0.1)
Basophils Relative: 1 %
Eosinophils Absolute: 0.2 10*3/uL (ref 0–0.7)
Eosinophils Relative: 2 %
HCT: 36 % — ABNORMAL LOW (ref 40.0–52.0)
Hemoglobin: 12.3 g/dL — ABNORMAL LOW (ref 13.0–18.0)
Lymphocytes Relative: 16 %
Lymphs Abs: 1.4 10*3/uL (ref 1.0–3.6)
MCH: 34.7 pg — ABNORMAL HIGH (ref 26.0–34.0)
MCHC: 34.1 g/dL (ref 32.0–36.0)
MCV: 101.6 fL — ABNORMAL HIGH (ref 80.0–100.0)
Monocytes Absolute: 1.4 10*3/uL — ABNORMAL HIGH (ref 0.2–1.0)
Monocytes Relative: 17 %
Neutro Abs: 5.4 10*3/uL (ref 1.4–6.5)
Neutrophils Relative %: 64 %
Platelets: 175 10*3/uL (ref 150–440)
RBC: 3.54 MIL/uL — ABNORMAL LOW (ref 4.40–5.90)
RDW: 15.2 % — ABNORMAL HIGH (ref 11.5–14.5)
WBC: 8.4 10*3/uL (ref 3.8–10.6)

## 2015-04-21 LAB — COMPREHENSIVE METABOLIC PANEL
ALT: 24 U/L (ref 17–63)
AST: 15 U/L (ref 15–41)
Albumin: 3.5 g/dL (ref 3.5–5.0)
Alkaline Phosphatase: 56 U/L (ref 38–126)
Anion gap: 10 (ref 5–15)
BUN: 23 mg/dL — ABNORMAL HIGH (ref 6–20)
CO2: 24 mmol/L (ref 22–32)
Calcium: 8.9 mg/dL (ref 8.9–10.3)
Chloride: 104 mmol/L (ref 101–111)
Creatinine, Ser: 1.21 mg/dL (ref 0.61–1.24)
GFR calc Af Amer: >60 mL/min (ref >60)
GFR calc non Af Amer: 59 mL/min — ABNORMAL LOW (ref >60)
Glucose, Bld: 108 mg/dL — ABNORMAL HIGH (ref 65–99)
Potassium: 3.5 mmol/L (ref 3.5–5.1)
Sodium: 138 mmol/L (ref 135–145)
Total Bilirubin: 0.7 mg/dL (ref 0.3–1.2)
Total Protein: 6.1 g/dL — ABNORMAL LOW (ref 6.5–8.1)

## 2015-04-22 LAB — PROTEIN ELECTROPHORESIS, SERUM
A/G Ratio: 1.5 (ref 0.7–1.7)
Albumin ELP: 3.1 g/dL (ref 2.9–4.4)
Alpha-1-Globulin: 0.1 g/dL (ref 0.0–0.4)
Alpha-2-Globulin: 0.7 g/dL (ref 0.4–1.0)
Beta Globulin: 1 g/dL (ref 0.7–1.3)
Gamma Globulin: 0.2 g/dL — ABNORMAL LOW (ref 0.4–1.8)
Globulin, Total: 2.1 g/dL — ABNORMAL LOW (ref 2.2–3.9)
Total Protein ELP: 5.2 g/dL — ABNORMAL LOW (ref 6.0–8.5)

## 2015-04-22 LAB — KAPPA/LAMBDA LIGHT CHAINS
Kappa free light chain: 92.93 mg/L — ABNORMAL HIGH (ref 3.30–19.40)
Kappa, lambda light chain ratio: 9.49 — ABNORMAL HIGH (ref 0.26–1.65)
Lambda free light chains: 9.79 mg/L (ref 5.71–26.30)

## 2015-04-26 ENCOUNTER — Other Ambulatory Visit: Payer: Self-pay | Admitting: *Deleted

## 2015-04-26 ENCOUNTER — Encounter: Payer: Self-pay | Admitting: *Deleted

## 2015-04-28 ENCOUNTER — Inpatient Hospital Stay (HOSPITAL_BASED_OUTPATIENT_CLINIC_OR_DEPARTMENT_OTHER): Payer: Medicare Other | Admitting: Hematology and Oncology

## 2015-04-28 ENCOUNTER — Encounter: Payer: Self-pay | Admitting: Hematology and Oncology

## 2015-04-28 VITALS — BP 144/86 | HR 85 | Temp 97.5°F | Ht 67.0 in | Wt 162.9 lb

## 2015-04-28 DIAGNOSIS — T50995S Adverse effect of other drugs, medicaments and biological substances, sequela: Secondary | ICD-10-CM | POA: Diagnosis not present

## 2015-04-28 DIAGNOSIS — E785 Hyperlipidemia, unspecified: Secondary | ICD-10-CM

## 2015-04-28 DIAGNOSIS — N400 Enlarged prostate without lower urinary tract symptoms: Secondary | ICD-10-CM

## 2015-04-28 DIAGNOSIS — Z9484 Stem cells transplant status: Secondary | ICD-10-CM

## 2015-04-28 DIAGNOSIS — C9000 Multiple myeloma not having achieved remission: Secondary | ICD-10-CM

## 2015-04-28 DIAGNOSIS — I10 Essential (primary) hypertension: Secondary | ICD-10-CM

## 2015-04-28 DIAGNOSIS — K219 Gastro-esophageal reflux disease without esophagitis: Secondary | ICD-10-CM

## 2015-04-28 DIAGNOSIS — Z87891 Personal history of nicotine dependence: Secondary | ICD-10-CM

## 2015-04-28 DIAGNOSIS — Z86711 Personal history of pulmonary embolism: Secondary | ICD-10-CM

## 2015-04-28 DIAGNOSIS — Z79899 Other long term (current) drug therapy: Secondary | ICD-10-CM

## 2015-04-28 DIAGNOSIS — M87180 Osteonecrosis due to drugs, jaw: Secondary | ICD-10-CM

## 2015-04-28 DIAGNOSIS — M549 Dorsalgia, unspecified: Secondary | ICD-10-CM

## 2015-04-28 DIAGNOSIS — Z7901 Long term (current) use of anticoagulants: Secondary | ICD-10-CM

## 2015-04-28 DIAGNOSIS — I4891 Unspecified atrial fibrillation: Secondary | ICD-10-CM

## 2015-04-28 NOTE — Progress Notes (Signed)
Pt here today for follow up regarding multiple myeloma; currently taking REvlimid; labs done last week; c/o pulled muscle in chest for 5-6 weeks; denies any chest pain or SOB

## 2015-04-28 NOTE — Progress Notes (Signed)
[facility] day:  04/28/2015  Chief Complaint: Logan Perez is a 69 y.o. 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  HPI: The patient was last seen in the medical oncology clinic on 01/27/2015.  At that time, he was doing well. He had increased back pain secondary to recent lifting. He had a chronic indwelling pain pump.  Labs from 01/18/2015 included a hematocrit of 7, hemoglobin 12.8, MCV 100, platelet 176,000, white count 6500 with an [place] of 5500.  Comprehensive metabolic panel included a creatinine of 1.23. Albumen was 4.1 with a calcium of 9.4. Liver function tests were normal.  Serum protein electrophoresis revealed no monoclonal spike.  Kappa free light chains were 23.71 (3.3-19.4) with a ratio of 2.17 (0.26-1.65).  During the interim, he states that he is pretty stable. He did pull a muscle in his right proximal arm. He states that he took his his last pill on Thursday and that he has a week off. He notes a summer cold came back. He is on a Z-Pak.  Past Medical History  Diagnosis Date  . Pulmonary embolism   . Stroke   . GERD (gastroesophageal reflux disease)   . Hypertension   . Atrial fibrillation   . HLD (hyperlipidemia)   . Multiple myeloma   . Elevated PSA   . BPH (benign prostatic hyperplasia)   . Difficulty voiding     Past Surgical History  Procedure Laterality Date  . Back surgery  1994  . Knee arthroscopy Left 1992  . Pain pump implantation      Family History  Problem Relation Age of Onset  . Cancer Father   . Cancer Brother     Social History:  reports that he has quit smoking. His smoking use included Cigarettes. He has a 87.5 pack-year smoking history. He does not have any smokeless tobacco history on file. He reports that he does not drink alcohol. His drug history is not on  file.  The patient is alone today.  Allergies:  Allergies  Allergen Reactions  . Zometa [Zoledronic Acid] Other (See Comments)    Osteonecrosis of the jaw  . Rivaroxaban Rash    Current Medications: Current Outpatient Prescriptions  Medication Sig Dispense Refill  . ALPRAZolam (XANAX) 0.25 MG tablet Take by mouth.    [name] atorvastatin (LIPITOR) 10 MG tablet Take by mouth.    . baclofen (LIORESAL) 10 MG tablet Take by mouth.    . calcium citrate-vitamin D (CITRACAL+D) 315-200 MG-UNIT per tablet Take by mouth.    . cetirizine (ZYRTEC) 10 MG tablet Take by mouth.    . Cholecalciferol (VITAMIN D3) 2000 UNITS capsule Take by mouth.    . Cyanocobalamin (RA VITAMIN B-12 TR) 1000 MCG TBCR Take by mouth.    . dexamethasone (DECADRON) 4 MG tablet TAKE 5 TABLETS BY MOUTH 1 TIME A WEEK 21 tablet 0  . diltiazem (CARDIZEM CD) 120 MG 24 hr capsule TAKE 1 CAPSULE ONCE DAILY    . diltiazem (CARDIZEM) 60 MG tablet Take by mouth.    Arne Cleveland 5 MG TABS tablet TK 1 T PO TWICE DAILY  3  . HYDROcodone-acetaminophen (NORCO/VICODIN) 5-325 MG per tablet Take by mouth.    [name] ibuprofen (ADVIL,MOTRIN) 200 MG tablet Take by mouth.    . lenalidomide (REVLIMID) 10 MG capsule Take by mouth.    [name]  loperamide (IMODIUM) 2 MG capsule Take by mouth.    . naproxen sodium (RA NAPROXEN SODIUM) 220 MG tablet Take by mouth.    [name] omeprazole (PRILOSEC) 20 MG capsule Take by mouth.    . potassium chloride SA (K-DUR,KLOR-CON) 20 MEQ tablet TAKE 1 TABLET BY MOUTH TWICE DAILY 180 tablet 0  . ranitidine (ZANTAC) 150 MG tablet Take by mouth.    . tamsulosin (FLOMAX) 0.4 MG CAPS capsule Take by mouth.    . vitamin B-12 (CYANOCOBALAMIN) 1000 MCG tablet TK 1 T PO QD  0  . Vitamin D, Ergocalciferol, (DRISDOL) 50000 UNITS CAPS capsule Take by mouth.    . ALPRAZolam (XANAX) 0.25 MG tablet TK 1 T PO  Q 8 H PRF ANXIETY  3  . aspirin EC 81 MG tablet Take by mouth.    [name] ketoconazole (NIZORAL) 2 % cream Apply topically.    [name] ketoconazole  (NIZORAL) 2 % cream APP EXT AA QD  0   No current facility-administered medications for this visit.    Review of Systems:  GENERAL:  Feels good.  Active.  No fevers, sweats or weight loss. PERFORMANCE STATUS (ECOG):  1 HEENT:  No visual changes, runny nose, sore throat, mouth sores or tenderness. Lungs: No shortness of breath or cough.  No hemoptysis. Cardiac:  No chest pain, palpitations, orthopnea, or PND. GI:  No nausea, vomiting, diarrhea, constipation, melena or hematochezia. GU:  No urgency, frequency, dysuria, or hematuria. Musculoskeletal:  Right proximal muscle pull.  No back pain.  No joint pain.  No muscle tenderness. Extremities:  No pain or swelling. Skin:  No rashes or skin changes. Neuro:  No headache, numbness or weakness, balance or coordination issues. Endocrine:  No diabetes, thyroid issues, hot flashes or night sweats. Psych:  No mood changes, depression or anxiety. Pain:  No focal pain. Review of systems:  All other systems reviewed and found to be negative.   Physical Exam: Blood pressure 144/86, pulse 85, temperature 97.5 F (36.4 C), temperature source Tympanic, height $RemoveBeforeDE'5\' 7"'[id number]$  (1.702 m), weight 162 lb 14.7 oz (73.9 kg). GENERAL:  Well developed, well nourished, sitting comfortably in the exam room in no acute distress. MENTAL STATUS:  Alert and oriented to person, place and time. HEAD:  Lilyan Punt with goatee.  Normocephalic, atraumatic, face symmetric, no Cushingoid features. EYES:  Glasses.  Blue eyes.  Pupils equal round and reactive to light and accomodation.  No conjunctivitis or scleral icterus. ENT:  Oropharynx clear without lesion.  Tongue normal. Mucous membranes moist.  RESPIRATORY:  Clear to auscultation without rales, wheezes or rhonchi. CARDIOVASCULAR:  Regular rate and rhythm without murmur, rub or gallop. ABDOMEN:  Right upper quadrant pain pump.  Soft, non-tender, with active bowel sounds, and no hepatosplenomegaly.  No masses. SKIN:  No rashes,  ulcers or lesions. EXTREMITIES: No edema, no skin discoloration or tenderness.  No palpable cords. LYMPH NODES: No palpable cervical, supraclavicular, axillary or inguinal adenopathy  NEUROLOGICAL: Unremarkable. PSYCH:  Appropriate.   No visits with results within 3 Day(s) from this visit. Latest known visit with results is:  Appointment on 04/21/2015  Component Date Value Ref Range Status  . WBC 04/21/2015 8.4  3.8 - 10.6 K/uL Final  . RBC 04/21/2015 3.54* 4.40 - 5.90 MIL/uL Final  . Hemoglobin 04/21/2015 12.3* 13.0 - 18.0 g/dL Final  . HCT 04/21/2015 36.0* 40.0 - 52.0 % Final  . MCV 04/21/2015 101.6* 80.0 - 100.0 fL Final  . MCH 04/21/2015 34.7* 26.0 - 34.0  pg Final  . MCHC 04/21/2015 34.1  32.0 - 36.0 g/dL Final  . RDW 04/21/2015 15.2* 11.5 - 14.5 % Final  . Platelets 04/21/2015 175  150 - 440 K/uL Final  . Neutrophils Relative % 04/21/2015 64   Final  . Neutro Abs 04/21/2015 5.4  1.4 - 6.5 K/uL Final  . Lymphocytes Relative 04/21/2015 16   Final  . Lymphs Abs 04/21/2015 1.4  1.0 - 3.6 K/uL Final  . Monocytes Relative 04/21/2015 17   Final  . Monocytes Absolute 04/21/2015 1.4* 0.2 - 1.0 K/uL Final  . Eosinophils Relative 04/21/2015 2   Final  . Eosinophils Absolute 04/21/2015 0.2  0 - 0.7 K/uL Final  . Basophils Relative 04/21/2015 1   Final  . Basophils Absolute 04/21/2015 0.0  0 - 0.1 K/uL Final  . Sodium 04/21/2015 138  135 - 145 mmol/L Final  . Potassium 04/21/2015 3.5  3.5 - 5.1 mmol/L Final  . Chloride 04/21/2015 104  101 - 111 mmol/L Final  . CO2 04/21/2015 24  22 - 32 mmol/L Final  . Glucose, Bld 04/21/2015 108* 65 - 99 mg/dL Final  . BUN 04/21/2015 23* 6 - 20 mg/dL Final  . Creatinine, Ser 04/21/2015 1.21  0.61 - 1.24 mg/dL Final  . Calcium 04/21/2015 8.9  8.9 - 10.3 mg/dL Final  . Total Protein 04/21/2015 6.1* 6.5 - 8.1 g/dL Final  . Albumin 04/21/2015 3.5  3.5 - 5.0 g/dL Final  . AST 04/21/2015 15  15 - 41 U/L Final  . ALT 04/21/2015 24  17 - 63 U/L Final  .  Alkaline Phosphatase 04/21/2015 56  38 - 126 U/L Final  . Total Bilirubin 04/21/2015 0.7  0.3 - 1.2 mg/dL Final  . GFR calc non Af Amer 04/21/2015 59* >60 mL/min Final  . GFR calc Af Amer 04/21/2015 >60  >60 mL/min Final   Comment: (NOTE) The eGFR has been calculated using the CKD EPI equation. This calculation has not been validated in all clinical situations. eGFR's persistently <60 mL/min signify possible Chronic Kidney Disease.   . Anion gap 04/21/2015 10  5 - 15 Final  . Total Protein ELP 04/21/2015 5.2* 6.0 - 8.5 g/dL Final  . Albumin ELP 04/21/2015 3.1  2.9 - 4.4 g/dL Final  . Alpha-1-Globulin 04/21/2015 0.1  0.0 - 0.4 g/dL Final  . Alpha-2-Globulin 04/21/2015 0.7  0.4 - 1.0 g/dL Final  . Beta Globulin 04/21/2015 1.0  0.7 - 1.3 g/dL Final  . Gamma Globulin 04/21/2015 0.2* 0.4 - 1.8 g/dL Final  . M-Spike, % 04/21/2015 Not Observed  Not Observed g/dL Final  . SPE Interp. 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 Serum immunoelectrophoresis and examination of the urine for Bence Jones protein may be indicated if warranted by the clinical picture. Performed At: [facility] [place], [place] [id number] Lindon Romp MD SE:[id number]   . Comment 04/21/2015 Comment   Final   Comment: (NOTE) Protein electrophoresis scan will follow via computer, mail, or courier delivery.   [name] GLOBULIN, TOTAL 04/21/2015 2.1* 2.2 - 3.9 g/dL Corrected  . A/G Ratio 04/21/2015 1.5  0.7 - 1.7 Corrected  . Kappa free light chain 04/21/2015 92.93* 3.30 - 19.40 mg/L Final  . Lamda free light chains 04/21/2015 9.79  5.71 - 26.30 mg/L Final  . Kappa, lamda light chain ratio 04/21/2015 9.49* 0.26 - 1.65 Final   Comment: (NOTE) Performed At:  [facility] [address] [place], [place] [id number] Lindon Romp MD EB:[id number]     Assessment:  Logan Perez is  a 69 y.o. male with stage III mutiple myeloma.  He initially presented with progressive back pain beginning in 12/2006.  MRI revealed "spots and compression fractures".  He began Velcade, thalidomide, and Decadron.  In 08/2007, he underwent high dose chemotherapy and autologous stem cell transplant.  He recurred with a rising M-spike (2.7) with repeat M spike (1.7 gm/dl) in 03/2010.  He was initially treated with Velcade (02/08/2010 - 05/10/2010).  He then began Revlamid (15 mg 3 weeks on/1 week off) and Decadron (40 mg on day 1, 8, 15, 22).  Because of significant side effect with Decadron his dose was decreased to 10 mg once a week in 07/2010.    He has been on maintenance Revlimid.  Revlimid was initially10 mg 3 weeks on/1 week off.  This was changed to 10 mg 2 weeks on /2 weeks off secondary to right nipple tenderness.  He has been back on 10 mg 3 weeks on/1 week off with Decadron 10 mg a week (on Sundays).  He is to start his next cycle of Revlamid on 12/18/2014.  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  He develoed herpes zoster in 04/2008.  He developed a pulmonary embolism in 05/2013.  He was initially on Xarelto, but is now on Eliquis.  He had an episode of pneumonia around this time requiring a brief admission.  He developed severe lower leg cramps on 08/07/2014 secondary to hypokalemia. Duplex was negative.   Over the past year his SPEP has revealed no monoclonal protein.  Free light chains have been monitored.  Kappa free light chains were 18.54 on 11/21/2013, 18.37 on 02/20/2014, 18.93 on 05/22/2014, 32.58 (high; normal ratio 1.27) on 08/21/2014, 51.53 (high; elevated ratio of 2.12) on 11/13/2014, 28.08 (ratio 1.73) on 12/09/2014, 23.71 (ratio 2.17) on 01/18/2015 and 92.93 (ratio 9.49) on 04/21/2015.  Bone survey on 12/08/2014 was stable.  Symptomatically, he feels well. He has right arm tenderness after pulling a muscle.  He has a  chronic indwelling pain pump.  Plan: 1. Review labs from 04/21/2015. 2. Increase Revlimid to 15 mg a day. 3. RTC in 1 month for MD assessment and labs 1 week prior (CBC with diff, CMP, free light chains).   Lequita Asal, MD  04/28/2015, 5:49 PM

## 2015-04-29 ENCOUNTER — Encounter: Payer: Self-pay | Admitting: Urology

## 2015-04-29 ENCOUNTER — Ambulatory Visit (INDEPENDENT_AMBULATORY_CARE_PROVIDER_SITE_OTHER): Payer: Medicare Other | Admitting: Urology

## 2015-04-29 VITALS — BP 126/77 | HR 73 | Ht 67.0 in | Wt 164.9 lb

## 2015-04-29 DIAGNOSIS — N138 Other obstructive and reflux uropathy: Secondary | ICD-10-CM

## 2015-04-29 DIAGNOSIS — N401 Enlarged prostate with lower urinary tract symptoms: Secondary | ICD-10-CM | POA: Diagnosis not present

## 2015-04-29 LAB — BLADDER SCAN AMB NON-IMAGING: Scan Result: 38

## 2015-04-29 MED ORDER — TAMSULOSIN HCL 0.4 MG PO CAPS: 0.4000 mg | ORAL_CAPSULE | Freq: Every day | ORAL | Status: DC

## 2015-04-29 NOTE — Progress Notes (Signed)
04/29/2015 9:12 AM   Logan Perez 12-20-1945 [id number]  Referring provider: No referring provider defined for this encounter.  Chief Complaint  Patient presents with  . Benign Prostatic Hypertrophy    one year recheck    HPI: Logan Perez is a 69 year old white male with BPH with LUTS who presents today for his 1 year recheck.  His IPSS score today is 2, which is mild lower urinary tract symptomatology. He is pleased with his quality life due to his urinary symptoms. His PVR is 38 mL.  His major complaint is a weak urinary stream and some dribbling when he increases his pain pump dosing. When his pain pump is at basal levels, his main can plate is urinary urgency. He is finding it easier to postpone urination by delaying his voiding when he first feels the urge.  He is currently taking tamsulosin or 0.4 mg daily.  He denies any dysuria, hematuria or suprapubic pain. He also denies any recent fevers, chills, nausea or vomiting.      IPSS      04/29/15 0900       International Prostate Symptom Score   How often have you had the sensation of not emptying your bladder? Not at All     How often have you had to urinate less than every two hours? Not at All     How often have you found it difficult to postpone urination? Less than 1 in 5 times     How often have you had a weak urinary stream? Not at All     How often have you had to strain to start urination? Not at All     How many times did you typically get up at night to urinate? 1 Time     Total IPSS Score 2     Quality of Life due to urinary symptoms   If you were to spend the rest of your life with your urinary condition just the way it is now how would you feel about that? Pleased        Score:  1-7 Mild 8-19 Moderate 20-35 Severe  PMH: Past Medical History  Diagnosis Date  . Pulmonary embolism   . Stroke   . GERD (gastroesophageal reflux disease)   . Hypertension   . Atrial fibrillation   . HLD  (hyperlipidemia)   . Multiple myeloma   . Elevated PSA   . BPH (benign prostatic hyperplasia)   . Difficulty voiding     Surgical History: Past Surgical History  Procedure Laterality Date  . Back surgery  1994  . Knee arthroscopy Left 1992  . Pain pump implantation  2012  . Stem cell implant  2008    [facility]    Home Medications:    Medication List       This list is accurate as of: 04/29/15  9:12 AM.  Always use your most recent med list.               ALPRAZolam 0.25 MG tablet  Commonly known as:  XANAX  Take by mouth.     ALPRAZolam 0.25 MG tablet  Commonly known as:  XANAX  TK 1 T PO  Q 8 H PRF ANXIETY     aspirin EC 81 MG tablet  Take by mouth.     atorvastatin 10 MG tablet  Commonly known as:  LIPITOR  Take by mouth.     baclofen 10 MG tablet  Commonly  known as:  LIORESAL  Take by mouth.     calcium citrate-vitamin D 315-200 MG-UNIT per tablet  Commonly known as:  CITRACAL+D  Take by mouth.     cetirizine 10 MG tablet  Commonly known as:  ZYRTEC  Take by mouth.     dexamethasone 4 MG tablet  Commonly known as:  DECADRON  TAKE 5 TABLETS BY MOUTH 1 TIME A WEEK     diltiazem 120 MG 24 hr capsule  Commonly known as:  CARDIZEM CD  TAKE 1 CAPSULE ONCE DAILY     diltiazem 60 MG tablet  Commonly known as:  CARDIZEM  Take by mouth.     ELIQUIS 5 MG Tabs tablet  Generic drug:  apixaban  TK 1 T PO TWICE DAILY     HYDROcodone-acetaminophen 5-325 MG per tablet  Commonly known as:  NORCO/VICODIN  Take by mouth.     ibuprofen 200 MG tablet  Commonly known as:  ADVIL,MOTRIN  Take by mouth.     ketoconazole 2 % cream  Commonly known as:  NIZORAL  Apply topically.     ketoconazole 2 % cream  Commonly known as:  NIZORAL  APP EXT AA QD     lenalidomide 10 MG capsule  Commonly known as:  REVLIMID  Take by mouth.     loperamide 2 MG capsule  Commonly known as:  IMODIUM  Take by mouth.     omeprazole 20 MG capsule  Commonly known as:  PRILOSEC    Take by mouth.     potassium chloride SA 20 MEQ tablet  Commonly known as:  K-DUR,KLOR-CON  TAKE 1 TABLET BY MOUTH TWICE DAILY     RA NAPROXEN SODIUM 220 MG tablet  Generic drug:  naproxen sodium  Take by mouth.     RA VITAMIN B-12 TR 1000 MCG Tbcr  Generic drug:  Cyanocobalamin  Take by mouth.     vitamin B-12 1000 MCG tablet  Commonly known as:  CYANOCOBALAMIN  TK 1 T PO QD     ranitidine 150 MG tablet  Commonly known as:  ZANTAC  Take by mouth.     tamsulosin 0.4 MG Caps capsule  Commonly known as:  FLOMAX  Take by mouth.     Vitamin D (Ergocalciferol) 50000 UNITS Caps capsule  Commonly known as:  DRISDOL  Take by mouth.     Vitamin D3 2000 UNITS capsule  Take by mouth.        Allergies:  Allergies  Allergen Reactions  . Zometa [Zoledronic Acid] Other (See Comments)    Osteonecrosis of the jaw  . Rivaroxaban Rash    Family History: Family History  Problem Relation Age of Onset  . Cancer Father     throat  . Bladder Cancer Neg Hx   . Kidney disease Sister   . Prostate cancer Neg Hx   . Stroke      Social History:  reports that he has quit smoking. His smoking use included Cigarettes. He has a 87.5 pack-year smoking history. He does not have any smokeless tobacco history on file. He reports that he does not drink alcohol or use illicit drugs.  ROS: UROLOGY Frequent Urination?: No Hard to postpone urination?: No Burning/pain with urination?: No Get up at night to urinate?: No Leakage of urine?: No Urine stream starts and stops?: Yes Trouble starting stream?: Yes Do you have to strain to urinate?: No Blood in urine?: No Urinary tract infection?: No Sexually transmitted disease?: No Injury  to kidneys or bladder?: No Painful intercourse?: No Weak stream?: No Erection problems?: No Penile pain?: No  Gastrointestinal Nausea?: No Vomiting?: No Indigestion/heartburn?: No Diarrhea?: No Constipation?: No  Constitutional Fever: No Night  sweats?: No Weight loss?: No Fatigue?: No  Skin Skin rash/lesions?: No Itching?: No  Eyes Blurred vision?: No Double vision?: No  Ears/Nose/Throat Sore throat?: No Sinus problems?: No  Hematologic/Lymphatic Swollen glands?: No Easy bruising?: No  Cardiovascular Leg swelling?: No Chest pain?: No  Respiratory Cough?: No Shortness of breath?: No  Endocrine Excessive thirst?: No  Musculoskeletal Back pain?: No Joint pain?: No  Neurological Headaches?: No Dizziness?: No  Psychologic Depression?: No Anxiety?: No  Physical Exam: BP 126/77 mmHg  Pulse 73  Ht _0  (1.702 m)  Wt 164 lb 14.4 oz (74.798 kg)  BMI 25.82 kg/m2  GU: Patient with circumcised phallus.   Urethral meatus is patent.  No penile discharge. No penile lesions or rashes. Scrotum without lesions, cysts, rashes and/or edema.  Testicles are located scrotally bilaterally, they are atrophic. No masses are appreciated in the testicles. Left and right epididymis are normal.  Rectal: Patient with  normal sphincter tone. Perineum without scarring or rashes. No rectal masses are appreciated. Prostate is approximately 50 grams, no nodules are appreciated. Seminal vesicles are normal.   Laboratory Data: Results for orders placed or performed in visit on 04/29/15  BLADDER SCAN AMB NON-IMAGING  Result Value Ref Range   Scan Result 38    Lab Results  Component Value Date   WBC 8.4 04/21/2015   HGB 12.3* 04/21/2015   HCT 36.0* 04/21/2015   MCV 101.6* 04/21/2015   PLT 175 04/21/2015    Lab Results  Component Value Date   CREATININE 1.21 04/21/2015    No results found for: PSA  No results found for: TESTOSTERONE  No results found for: HGBA1C  Urinalysis No results found for: COLORURINE, APPEARANCEUR, LABSPEC, PHURINE, GLUCOSEU, HGBUR, BILIRUBINUR, KETONESUR, PROTEINUR, UROBILINOGEN, NITRITE, LEUKOCYTESUR  Pertinent Imaging:   Assessment & Plan:    1. BPH (benign prostatic hyperplasia)  with LUTS:   IPS S score is 2/1. His PVR is 38 mL. He will continue the tamsulosin.  A refill sent to his pharmacy. He will follow-up in one year time for IPSS score, PVR and PSA.  PSA History:    2.9 ng/mL on 04/30/2012    1.9 ng/mL on 05/01/2013    1.5 ng/mL on 04/30/2014  - PSA - BLADDER SCAN AMB NON-IMAGING   No Follow-up on file.  Zara Council, [facility] [address] [place], [place] [address] [phone]

## 2015-04-30 ENCOUNTER — Telehealth: Payer: Self-pay

## 2015-04-30 LAB — PSA: Prostate Specific Ag, Serum: 2.1 ng/mL (ref 0.0–4.0)

## 2015-04-30 NOTE — Telephone Encounter (Signed)
-----   Message from Nori Riis, PA-C sent at 04/30/2015  8:46 AM EDT ----- Patient's PSA is stable.  We will see him in 6 months.  PSA to be drawn before his next appointment.

## 2015-04-30 NOTE — Telephone Encounter (Signed)
LMOM

## 2015-05-03 ENCOUNTER — Telehealth: Payer: Self-pay | Admitting: *Deleted

## 2015-05-03 NOTE — Telephone Encounter (Signed)
Spoke with pt in reference to PSA. Pt stated that he has been coming once a year for the past 5 or 6 years and wants to know why he needs to come every 33mo now. Please advise.

## 2015-05-03 NOTE — Telephone Encounter (Signed)
He needs to f/u in one year.

## 2015-05-03 NOTE — Telephone Encounter (Signed)
Pt belongs to Dr. Mike Gip. Will forward message to [place].

## 2015-05-03 NOTE — Telephone Encounter (Signed)
-----   Message from Nori Riis, PA-C sent at 04/30/2015  8:46 AM EDT ----- Patient's PSA is stable.  We will see him in 6 months.  PSA to be drawn before his next appointment.

## 2015-05-03 NOTE — Telephone Encounter (Signed)
Please call Adrienne with [facility] at [phone] ext [phone] regarding pt's prescription for Revlimid 10 mg tablets; they just need to verify the date.Marland KitchenMarland Kitchen

## 2015-05-04 ENCOUNTER — Telehealth: Payer: Self-pay | Admitting: *Deleted

## 2015-05-04 ENCOUNTER — Telehealth: Payer: Self-pay

## 2015-05-04 ENCOUNTER — Other Ambulatory Visit: Payer: Self-pay | Admitting: Hematology and Oncology

## 2015-05-04 DIAGNOSIS — C9000 Multiple myeloma not having achieved remission: Secondary | ICD-10-CM | POA: Insufficient documentation

## 2015-05-04 MED ORDER — LENALIDOMIDE 15 MG PO CAPS: 15.0000 mg | ORAL_CAPSULE | Freq: Every day | ORAL | Status: DC

## 2015-05-04 NOTE — Telephone Encounter (Signed)
Faxed new rx of Revlimid 15 mg , take one capsule by mouth daily; 3 weeks on and 1 week off to [facility] at [phone]

## 2015-05-04 NOTE — Telephone Encounter (Signed)
Spoke with pt in reference to 36yr f/u. Pt voiced understanding.

## 2015-05-04 NOTE — Telephone Encounter (Signed)
Pt states that he just spoke with [facility] pharmacy and they have him scheduled to receive Revlimid 10 mg capsules; however it should be Revlimid 15 mg capsules. Will need this to be taken care of for a Thursday delivery.Marland KitchenMarland Kitchen

## 2015-05-05 ENCOUNTER — Telehealth: Payer: Self-pay | Admitting: *Deleted

## 2015-05-05 NOTE — Telephone Encounter (Signed)
Left voicemail at [phone] ext [phone] that pt's dose of Revlimid was increased to 15 mg and that Rx was faxed to them yesterday; encouraged to contact us with further questions

## 2015-05-05 NOTE — Telephone Encounter (Signed)
Please call [facility] at [phone] ext [phone] to verify pt's dose of Revlimid.Marland KitchenMarland Kitchen

## 2015-05-10 ENCOUNTER — Other Ambulatory Visit: Payer: Self-pay | Admitting: Hematology and Oncology

## 2015-05-12 NOTE — Telephone Encounter (Signed)
  I thought Juliann Pulse took care of this.  I believe a new Rx was written.  M

## 2015-05-26 ENCOUNTER — Inpatient Hospital Stay: Payer: Medicare Other | Attending: Hematology and Oncology

## 2015-05-26 DIAGNOSIS — C9000 Multiple myeloma not having achieved remission: Secondary | ICD-10-CM | POA: Diagnosis not present

## 2015-05-26 DIAGNOSIS — Z9221 Personal history of antineoplastic chemotherapy: Secondary | ICD-10-CM | POA: Diagnosis not present

## 2015-05-26 DIAGNOSIS — Z9481 Bone marrow transplant status: Secondary | ICD-10-CM | POA: Diagnosis not present

## 2015-05-26 LAB — COMPREHENSIVE METABOLIC PANEL
ALT: 23 U/L (ref 17–63)
AST: 16 U/L (ref 15–41)
Albumin: 3.5 g/dL (ref 3.5–5.0)
Alkaline Phosphatase: 47 U/L (ref 38–126)
Anion gap: 9 (ref 5–15)
BUN: 20 mg/dL (ref 6–20)
CO2: 26 mmol/L (ref 22–32)
Calcium: 9.2 mg/dL (ref 8.9–10.3)
Chloride: 103 mmol/L (ref 101–111)
Creatinine, Ser: 1.24 mg/dL (ref 0.61–1.24)
GFR calc Af Amer: >60 mL/min (ref >60)
GFR calc non Af Amer: 58 mL/min — ABNORMAL LOW (ref >60)
Glucose, Bld: 120 mg/dL — ABNORMAL HIGH (ref 65–99)
Potassium: 3.5 mmol/L (ref 3.5–5.1)
Sodium: 138 mmol/L (ref 135–145)
Total Bilirubin: 0.9 mg/dL (ref 0.3–1.2)
Total Protein: 5.7 g/dL — ABNORMAL LOW (ref 6.5–8.1)

## 2015-05-26 LAB — CBC WITH DIFFERENTIAL/PLATELET
Basophils Absolute: 0 10*3/uL (ref 0–0.1)
Basophils Relative: 0 %
Eosinophils Absolute: 0.3 10*3/uL (ref 0–0.7)
Eosinophils Relative: 6 %
HCT: 35.1 % — ABNORMAL LOW (ref 40.0–52.0)
Hemoglobin: 12.1 g/dL — ABNORMAL LOW (ref 13.0–18.0)
Lymphocytes Relative: 18 %
Lymphs Abs: 0.9 10*3/uL — ABNORMAL LOW (ref 1.0–3.6)
MCH: 35 pg — ABNORMAL HIGH (ref 26.0–34.0)
MCHC: 34.6 g/dL (ref 32.0–36.0)
MCV: 101.2 fL — ABNORMAL HIGH (ref 80.0–100.0)
Monocytes Absolute: 0.9 10*3/uL (ref 0.2–1.0)
Monocytes Relative: 18 %
Neutro Abs: 2.9 10*3/uL (ref 1.4–6.5)
Neutrophils Relative %: 58 %
Platelets: 113 10*3/uL — ABNORMAL LOW (ref 150–440)
RBC: 3.46 MIL/uL — ABNORMAL LOW (ref 4.40–5.90)
RDW: 16 % — ABNORMAL HIGH (ref 11.5–14.5)
WBC: 5 10*3/uL (ref 3.8–10.6)

## 2015-05-27 LAB — KAPPA/LAMBDA LIGHT CHAINS
Kappa free light chain: 93.44 mg/L — ABNORMAL HIGH (ref 3.30–19.40)
Kappa, lambda light chain ratio: 10.28 — ABNORMAL HIGH (ref 0.26–1.65)
Lambda free light chains: 9.09 mg/L (ref 5.71–26.30)

## 2015-06-02 ENCOUNTER — Inpatient Hospital Stay (HOSPITAL_BASED_OUTPATIENT_CLINIC_OR_DEPARTMENT_OTHER): Payer: Medicare Other | Admitting: Hematology and Oncology

## 2015-06-02 VITALS — BP 143/75 | HR 80 | Temp 98.5°F | Resp 20 | Wt 166.4 lb

## 2015-06-02 DIAGNOSIS — Z9221 Personal history of antineoplastic chemotherapy: Secondary | ICD-10-CM | POA: Diagnosis not present

## 2015-06-02 DIAGNOSIS — C9000 Multiple myeloma not having achieved remission: Secondary | ICD-10-CM | POA: Diagnosis not present

## 2015-06-02 DIAGNOSIS — Z9481 Bone marrow transplant status: Secondary | ICD-10-CM

## 2015-06-02 NOTE — Progress Notes (Signed)
[facility] day:  06/02/2015  Chief Complaint: Logan Perez is a 69 y.o. 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  HPI: The patient was last seen in the medical oncology clinic on 04/28/2015.  At that time, he was doing well except for a right arm pull.  Labs revealed an increase in kappa free light chains.  Decision was made to increase his Revlimid to 15 mg a day.  During the interim, he has done fairly well. He states that he takes his Decadron on Sundays. His next cycle of Revlimid starts Friday.  He remains on his pain pump for history of a compression fracture of his back. He has had no interval infections. His skin is fragile. 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   Past Medical History  Diagnosis Date  . Pulmonary embolism   . Stroke   . GERD (gastroesophageal reflux disease)   . Hypertension   . Atrial fibrillation   . HLD (hyperlipidemia)   . Multiple myeloma   . Elevated PSA   . BPH (benign prostatic hyperplasia)   . Difficulty voiding     Past Surgical History  Procedure Laterality Date  . Back surgery  1994  . Knee arthroscopy Left 1992  . Pain pump implantation  2012  . Stem cell implant  2008    [facility]    Family History  Problem Relation Age of Onset  . Cancer Father     throat  . Bladder Cancer Neg Hx   . Kidney disease Sister   . Prostate cancer Neg Hx   . Stroke      Social History:  reports that he has quit smoking. His smoking use included Cigarettes. He has a 87.5 pack-year smoking history. He does not have any smokeless tobacco history on file. He reports that he does not drink alcohol or use illicit drugs.  The patient is alone today.  Allergies:  Allergies  Allergen Reactions  . Zometa [Zoledronic Acid] Other (See  Comments)    Osteonecrosis of the jaw  . Rivaroxaban Rash    Current Medications: Current Outpatient Prescriptions  Medication Sig Dispense Refill  . ALPRAZolam (XANAX) 0.25 MG tablet Take by mouth.    . ALPRAZolam (XANAX) 0.25 MG tablet TK 1 T PO  Q 8 H PRF ANXIETY  3  . aspirin EC 81 MG tablet Take by mouth.    [name] atorvastatin (LIPITOR) 10 MG tablet Take by mouth.    . baclofen (LIORESAL) 10 MG tablet Take by mouth.    . calcium citrate-vitamin D (CITRACAL+D) 315-200 MG-UNIT per tablet Take by mouth.    . cetirizine (ZYRTEC) 10 MG tablet Take by mouth.    . Cholecalciferol (VITAMIN D3) 2000 UNITS capsule Take by mouth.    . Cyanocobalamin (RA VITAMIN B-12 TR) 1000 MCG TBCR Take by mouth.    . dexamethasone (DECADRON) 4 MG tablet TAKE 5 TABLETS BY MOUTH 1 TIME A WEEK 21 tablet 0  . diltiazem (CARDIZEM CD) 120 MG 24 hr capsule TAKE 1 CAPSULE ONCE DAILY    . diltiazem (CARDIZEM) 60 MG tablet Take by mouth.    Arne Cleveland 5 MG TABS tablet TK 1 T PO TWICE DAILY  3  . HYDROcodone-acetaminophen (NORCO/VICODIN) 5-325 MG per tablet Take by  mouth.    . ibuprofen (ADVIL,MOTRIN) 200 MG tablet Take by mouth.    [name] ketoconazole (NIZORAL) 2 % cream Apply topically.    [name] ketoconazole (NIZORAL) 2 % cream APP EXT AA QD  0  . lenalidomide (REVLIMID) 15 MG capsule Take 1 capsule (15 mg total) by mouth daily. 3 weeks on and 1 week off. 21 capsule 0  . loperamide (IMODIUM) 2 MG capsule Take by mouth.    . naproxen sodium (RA NAPROXEN SODIUM) 220 MG tablet Take by mouth.    [name] omeprazole (PRILOSEC) 20 MG capsule Take by mouth.    . potassium chloride SA (K-DUR,KLOR-CON) 20 MEQ tablet TAKE 1 TABLET BY MOUTH TWICE DAILY 180 tablet 0  . ranitidine (ZANTAC) 150 MG tablet Take by mouth.    . tamsulosin (FLOMAX) 0.4 MG CAPS capsule Take 1 capsule (0.4 mg total) by mouth daily. 90 capsule 3  . vitamin B-12 (CYANOCOBALAMIN) 1000 MCG tablet TK 1 T PO QD  0  . Vitamin D, Ergocalciferol, (DRISDOL) 50000 UNITS CAPS  capsule Take by mouth.     No current facility-administered medications for this visit.    Review of Systems:  GENERAL:  Feels good.  Active.  No fevers, sweats or weight loss. PERFORMANCE STATUS (ECOG): 1 HEENT:  No visual changes, runny nose, sore throat, mouth sores or tenderness. Lungs: No shortness of breath or cough.  No hemoptysis. Cardiac:  No chest pain, palpitations, orthopnea, or PND. GI:  No nausea, vomiting, diarrhea, constipation, melena or hematochezia. GU:  No urgency, frequency, dysuria, or hematuria. Musculoskeletal:  Compression fracture of back for which he remains on a pain pump (chronic).  No joint pain.  No muscle tenderness. Extremities:  No pain or swelling. Skin:  Fragile skin.  No rashes or skin changes. Neuro:  No headache, numbness or weakness, balance or coordination issues. Endocrine:  No diabetes, thyroid issues, hot flashes or night sweats. Psych:  No mood changes, depression or anxiety. Pain:  No focal pain. Review of systems:  All other systems reviewed and found to be negative.   Physical Exam: Blood pressure 143/75, pulse 80, temperature 98.5 F (36.9 C), temperature source Oral, resp. rate 20, weight 166 lb 7.2 oz (75.5 kg), SpO2 97 %. GENERAL:  Well developed, well nourished, sitting comfortably in the exam room in no acute distress. MENTAL STATUS:  Alert and oriented to person, place and time. HEAD:  Pearline Cables short hair with goatee.  Normocephalic, atraumatic, face symmetric, no Cushingoid features. EYES:  Glasses.  Blue eyes.  Pupils equal round and reactive to light and accomodation.  No conjunctivitis or scleral icterus. ENT:  Oropharynx clear without lesion.  Hearing aide.  Tongue normal. Mucous membranes moist.  RESPIRATORY:  Clear to auscultation without rales, wheezes or rhonchi. CARDIOVASCULAR:  Regular rate and rhythm without murmur, rub or gallop. ABDOMEN:  RUQ pain pump.  Soft, non-tender, with active bowel sounds, and no  hepatosplenomegaly.  No masses. SKIN:  Ecchymosis arms (left > right).  No rashes, ulcers or lesions. EXTREMITIES: No edema, no skin discoloration or tenderness.  No palpable cords. LYMPH NODES: No palpable cervical, supraclavicular, axillary or inguinal adenopathy  NEUROLOGICAL: Unremarkable. PSYCH:  Appropriate.   No visits with results within 3 Day(s) from this visit. Latest known visit with results is:  Appointment on 05/26/2015  Component Date Value Ref Range Status  . WBC 05/26/2015 5.0  3.8 - 10.6 K/uL Final  . RBC 05/26/2015 3.46* 4.40 - 5.90 MIL/uL Final  . Hemoglobin  05/26/2015 12.1* 13.0 - 18.0 g/dL Final  . HCT 05/26/2015 35.1* 40.0 - 52.0 % Final  . MCV 05/26/2015 101.2* 80.0 - 100.0 fL Final  . MCH 05/26/2015 35.0* 26.0 - 34.0 pg Final  . MCHC 05/26/2015 34.6  32.0 - 36.0 g/dL Final  . RDW 05/26/2015 16.0* 11.5 - 14.5 % Final  . Platelets 05/26/2015 113* 150 - 440 K/uL Final  . Neutrophils Relative % 05/26/2015 58   Final  . Neutro Abs 05/26/2015 2.9  1.4 - 6.5 K/uL Final  . Lymphocytes Relative 05/26/2015 18   Final  . Lymphs Abs 05/26/2015 0.9* 1.0 - 3.6 K/uL Final  . Monocytes Relative 05/26/2015 18   Final  . Monocytes Absolute 05/26/2015 0.9  0.2 - 1.0 K/uL Final  . Eosinophils Relative 05/26/2015 6   Final  . Eosinophils Absolute 05/26/2015 0.3  0 - 0.7 K/uL Final  . Basophils Relative 05/26/2015 0   Final  . Basophils Absolute 05/26/2015 0.0  0 - 0.1 K/uL Final  . Sodium 05/26/2015 138  135 - 145 mmol/L Final  . Potassium 05/26/2015 3.5  3.5 - 5.1 mmol/L Final  . Chloride 05/26/2015 103  101 - 111 mmol/L Final  . CO2 05/26/2015 26  22 - 32 mmol/L Final  . Glucose, Bld 05/26/2015 120* 65 - 99 mg/dL Final  . BUN 05/26/2015 20  6 - 20 mg/dL Final  . Creatinine, Ser 05/26/2015 1.24  0.61 - 1.24 mg/dL Final  . Calcium 05/26/2015 9.2  8.9 - 10.3 mg/dL Final  . Total Protein 05/26/2015 5.7* 6.5 - 8.1 g/dL Final  . Albumin 05/26/2015 3.5  3.5 - 5.0 g/dL Final  .  AST 05/26/2015 16  15 - 41 U/L Final  . ALT 05/26/2015 23  17 - 63 U/L Final  . Alkaline Phosphatase 05/26/2015 47  38 - 126 U/L Final  . Total Bilirubin 05/26/2015 0.9  0.3 - 1.2 mg/dL Final  . GFR calc non Af Amer 05/26/2015 58* >60 mL/min Final  . GFR calc Af Amer 05/26/2015 >60  >60 mL/min Final   Comment: (NOTE) The eGFR has been calculated using the CKD EPI equation. This calculation has not been validated in all clinical situations. eGFR's persistently <60 mL/min signify possible Chronic Kidney Disease.   . Anion gap 05/26/2015 9  5 - 15 Final  . Kappa free light chain 05/26/2015 93.44* 3.30 - 19.40 mg/L Final  . Lamda free light chains 05/26/2015 9.09  5.71 - 26.30 mg/L Final  . Kappa, lamda light chain ratio 05/26/2015 10.28* 0.26 - 1.65 Final   Comment: (NOTE) Performed At: [facility] [address] [place], [place] [id number] Lindon Romp MD NL:[id number]     Assessment:  Logan Perez is a 69 y.o. male with stage III mutiple myeloma.  He initially presented with progressive back pain beginning in 12/2006.  MRI revealed "spots and compression fractures".  He began Velcade, thalidomide, and Decadron.  In 08/2007, he underwent high dose chemotherapy and autologous stem cell transplant.  He recurred with a rising M-spike (2.7) with repeat M spike (1.7 gm/dl) in 03/2010.  He was initially treated with Velcade (02/08/2010 - 05/10/2010).  He then began Revlamid (15 mg 3 weeks on/1 week off) and Decadron (40 mg on day 1, 8, 15, 22).  Because of significant side effect with Decadron his dose was decreased to 10 mg once a week in 07/2010.    He has been on maintenance Revlimid.  Revlimid was initially10 mg 3  weeks on/1 week off.  This was changed to 10 mg 2 weeks on /2 weeks off secondary to right nipple tenderness.  He has been back on 10 mg 3 weeks on/1 week off with Decadron 10 mg a week (on Sundays).  He is to start his next cycle of Revlamid on  12/18/2014.  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  He develoed herpes zoster in 04/2008.  He developed a pulmonary embolism in 05/2013.  He was initially on Xarelto, but is now on Eliquis.  He had an episode of pneumonia around this time requiring a brief admission.  He developed severe lower leg cramps on 08/07/2014 secondary to hypokalemia. Duplex was negative.   Over the past year his SPEP has revealed no monoclonal protein.  Free light chains have been monitored.  Kappa free light chains were 18.54 on 11/21/2013, 18.37 on 02/20/2014, 18.93 on 05/22/2014, 32.58 (high; normal ratio 1.27) on 08/21/2014, 51.53 (high; elevated ratio of 2.12) on 11/13/2014, 28.08 (ratio 1.73) on 12/09/2014, 23.71 (ratio 2.17) on 01/18/2015, 92.93 (ratio 9.49) on 04/21/2015, and 93.44 (ratio 10.28) on 05/26/2015.  Bone survey on 12/08/2014 was stable.  Symptomatically, he feels well. He has had no interval infections.  He has a chronic indwelling pain pump.  Plan: 1. Review labs from 05/26/2015. 2. Continue Revlimid 15 mg (3 weeks on/1 week off) and Decadron on Sundays. 3. RTC in 6 weeks for labs (CBC with diff, CMP, free light chains).  4.   RTC in 3 months for MD assess and labs (CBC with diff, CMP, SPEP, free leight chains).  Lequita Asal, MD  06/02/2015, 9:55 AM

## 2015-06-02 NOTE — Progress Notes (Signed)
Pt here for MM follow up. Stable. No new areas of pain or concern. Continues with some diarrhea about every other day. Takes imodium. Has a pain pump with fentanyl and baclofen for his back pain. Skin is fragile with bruising. Takes eliquis. Has mild fatigue . Activity causes some muscle pains  The next day. Appetite is great.

## 2015-06-07 ENCOUNTER — Other Ambulatory Visit: Payer: Self-pay | Admitting: Hematology and Oncology

## 2015-06-28 ENCOUNTER — Other Ambulatory Visit: Payer: Self-pay | Admitting: *Deleted

## 2015-06-28 MED ORDER — POTASSIUM CHLORIDE CRYS ER 20 MEQ PO TBCR: 20.0000 meq | EXTENDED_RELEASE_TABLET | Freq: Two times a day (BID) | ORAL | Status: DC

## 2015-06-28 NOTE — Addendum Note (Signed)
Addended by: Betti Cruz on: 06/28/2015 10:26 AM   Modules accepted: Orders, Medications

## 2015-06-28 NOTE — Telephone Encounter (Signed)
  Are we prescribing his Xanax?  M

## 2015-06-28 NOTE — Telephone Encounter (Signed)
E-Scribed.

## 2015-07-05 ENCOUNTER — Other Ambulatory Visit: Payer: Self-pay | Admitting: Hematology and Oncology

## 2015-07-14 ENCOUNTER — Inpatient Hospital Stay: Payer: Medicare Other | Attending: Hematology and Oncology

## 2015-07-14 DIAGNOSIS — Z87311 Personal history of (healed) other pathological fracture: Secondary | ICD-10-CM | POA: Insufficient documentation

## 2015-07-14 DIAGNOSIS — A047 Enterocolitis due to Clostridium difficile: Secondary | ICD-10-CM | POA: Diagnosis not present

## 2015-07-14 DIAGNOSIS — G8921 Chronic pain due to trauma: Secondary | ICD-10-CM | POA: Diagnosis not present

## 2015-07-14 DIAGNOSIS — F428 Other obsessive-compulsive disorder: Secondary | ICD-10-CM | POA: Insufficient documentation

## 2015-07-14 DIAGNOSIS — R197 Diarrhea, unspecified: Secondary | ICD-10-CM | POA: Insufficient documentation

## 2015-07-14 DIAGNOSIS — Z9481 Bone marrow transplant status: Secondary | ICD-10-CM | POA: Diagnosis not present

## 2015-07-14 DIAGNOSIS — R634 Abnormal weight loss: Secondary | ICD-10-CM | POA: Insufficient documentation

## 2015-07-14 DIAGNOSIS — I10 Essential (primary) hypertension: Secondary | ICD-10-CM | POA: Diagnosis not present

## 2015-07-14 DIAGNOSIS — Z79899 Other long term (current) drug therapy: Secondary | ICD-10-CM | POA: Insufficient documentation

## 2015-07-14 DIAGNOSIS — T50995S Adverse effect of other drugs, medicaments and biological substances, sequela: Secondary | ICD-10-CM | POA: Diagnosis not present

## 2015-07-14 DIAGNOSIS — Z86711 Personal history of pulmonary embolism: Secondary | ICD-10-CM | POA: Diagnosis not present

## 2015-07-14 DIAGNOSIS — C9002 Multiple myeloma in relapse: Secondary | ICD-10-CM | POA: Diagnosis present

## 2015-07-14 DIAGNOSIS — N400 Enlarged prostate without lower urinary tract symptoms: Secondary | ICD-10-CM | POA: Diagnosis not present

## 2015-07-14 DIAGNOSIS — Z8673 Personal history of transient ischemic attack (TIA), and cerebral infarction without residual deficits: Secondary | ICD-10-CM | POA: Insufficient documentation

## 2015-07-14 DIAGNOSIS — E785 Hyperlipidemia, unspecified: Secondary | ICD-10-CM | POA: Diagnosis not present

## 2015-07-14 DIAGNOSIS — C9000 Multiple myeloma not having achieved remission: Secondary | ICD-10-CM

## 2015-07-14 DIAGNOSIS — R61 Generalized hyperhidrosis: Secondary | ICD-10-CM | POA: Insufficient documentation

## 2015-07-14 DIAGNOSIS — I4891 Unspecified atrial fibrillation: Secondary | ICD-10-CM | POA: Insufficient documentation

## 2015-07-14 DIAGNOSIS — Z9221 Personal history of antineoplastic chemotherapy: Secondary | ICD-10-CM | POA: Insufficient documentation

## 2015-07-14 DIAGNOSIS — M87180 Osteonecrosis due to drugs, jaw: Secondary | ICD-10-CM | POA: Diagnosis not present

## 2015-07-14 DIAGNOSIS — G4700 Insomnia, unspecified: Secondary | ICD-10-CM | POA: Diagnosis not present

## 2015-07-14 DIAGNOSIS — F17210 Nicotine dependence, cigarettes, uncomplicated: Secondary | ICD-10-CM | POA: Diagnosis not present

## 2015-07-14 DIAGNOSIS — Z7901 Long term (current) use of anticoagulants: Secondary | ICD-10-CM | POA: Diagnosis not present

## 2015-07-14 DIAGNOSIS — K219 Gastro-esophageal reflux disease without esophagitis: Secondary | ICD-10-CM | POA: Insufficient documentation

## 2015-07-14 LAB — COMPREHENSIVE METABOLIC PANEL
ALT: 22 U/L (ref 17–63)
AST: 15 U/L (ref 15–41)
Albumin: 3.5 g/dL (ref 3.5–5.0)
Alkaline Phosphatase: 56 U/L (ref 38–126)
Anion gap: 9 (ref 5–15)
BUN: 22 mg/dL — ABNORMAL HIGH (ref 6–20)
CO2: 27 mmol/L (ref 22–32)
Calcium: 9 mg/dL (ref 8.9–10.3)
Chloride: 101 mmol/L (ref 101–111)
Creatinine, Ser: 1.28 mg/dL — ABNORMAL HIGH (ref 0.61–1.24)
GFR calc Af Amer: >60 mL/min (ref >60)
GFR calc non Af Amer: 55 mL/min — ABNORMAL LOW (ref >60)
Glucose, Bld: 113 mg/dL — ABNORMAL HIGH (ref 65–99)
Potassium: 3.4 mmol/L — ABNORMAL LOW (ref 3.5–5.1)
Sodium: 137 mmol/L (ref 135–145)
Total Bilirubin: 1.1 mg/dL (ref 0.3–1.2)
Total Protein: 6.1 g/dL — ABNORMAL LOW (ref 6.5–8.1)

## 2015-07-14 LAB — CBC WITH DIFFERENTIAL/PLATELET
Basophils Absolute: 0 10*3/uL (ref 0–0.1)
Basophils Relative: 0 %
Eosinophils Absolute: 0.3 10*3/uL (ref 0–0.7)
Eosinophils Relative: 4 %
HCT: 32 % — ABNORMAL LOW (ref 40.0–52.0)
Hemoglobin: 11 g/dL — ABNORMAL LOW (ref 13.0–18.0)
Lymphocytes Relative: 7 %
Lymphs Abs: 0.4 10*3/uL — ABNORMAL LOW (ref 1.0–3.6)
MCH: 35.6 pg — ABNORMAL HIGH (ref 26.0–34.0)
MCHC: 34.5 g/dL (ref 32.0–36.0)
MCV: 103.2 fL — ABNORMAL HIGH (ref 80.0–100.0)
Monocytes Absolute: 0.5 10*3/uL (ref 0.2–1.0)
Monocytes Relative: 8 %
Neutro Abs: 5.3 10*3/uL (ref 1.4–6.5)
Neutrophils Relative %: 81 %
Platelets: 122 10*3/uL — ABNORMAL LOW (ref 150–440)
RBC: 3.1 MIL/uL — ABNORMAL LOW (ref 4.40–5.90)
RDW: 16.5 % — ABNORMAL HIGH (ref 11.5–14.5)
WBC: 6.6 10*3/uL (ref 3.8–10.6)

## 2015-07-15 LAB — KAPPA/LAMBDA LIGHT CHAINS
Kappa free light chain: 255.45 mg/L — ABNORMAL HIGH (ref 3.30–19.40)
Kappa, lambda light chain ratio: 24.05 — ABNORMAL HIGH (ref 0.26–1.65)
Lambda free light chains: 10.62 mg/L (ref 5.71–26.30)

## 2015-07-19 ENCOUNTER — Telehealth: Payer: Self-pay | Admitting: Hematology and Oncology

## 2015-07-19 ENCOUNTER — Telehealth: Payer: Self-pay

## 2015-07-19 ENCOUNTER — Other Ambulatory Visit: Payer: Self-pay

## 2015-07-19 ENCOUNTER — Encounter: Payer: Self-pay | Admitting: Hematology and Oncology

## 2015-07-19 ENCOUNTER — Inpatient Hospital Stay (HOSPITAL_BASED_OUTPATIENT_CLINIC_OR_DEPARTMENT_OTHER): Payer: Medicare Other | Admitting: Family Medicine

## 2015-07-19 VITALS — BP 157/91 | HR 99 | Temp 95.6°F | Wt 157.2 lb

## 2015-07-19 DIAGNOSIS — R197 Diarrhea, unspecified: Secondary | ICD-10-CM

## 2015-07-19 DIAGNOSIS — Z9221 Personal history of antineoplastic chemotherapy: Secondary | ICD-10-CM | POA: Diagnosis not present

## 2015-07-19 DIAGNOSIS — I10 Essential (primary) hypertension: Secondary | ICD-10-CM

## 2015-07-19 DIAGNOSIS — C9002 Multiple myeloma in relapse: Secondary | ICD-10-CM | POA: Diagnosis not present

## 2015-07-19 DIAGNOSIS — R61 Generalized hyperhidrosis: Secondary | ICD-10-CM

## 2015-07-19 DIAGNOSIS — T50995S Adverse effect of other drugs, medicaments and biological substances, sequela: Secondary | ICD-10-CM

## 2015-07-19 DIAGNOSIS — G4700 Insomnia, unspecified: Secondary | ICD-10-CM

## 2015-07-19 DIAGNOSIS — C9000 Multiple myeloma not having achieved remission: Secondary | ICD-10-CM | POA: Diagnosis not present

## 2015-07-19 DIAGNOSIS — E785 Hyperlipidemia, unspecified: Secondary | ICD-10-CM

## 2015-07-19 DIAGNOSIS — Z9481 Bone marrow transplant status: Secondary | ICD-10-CM

## 2015-07-19 DIAGNOSIS — I4891 Unspecified atrial fibrillation: Secondary | ICD-10-CM

## 2015-07-19 DIAGNOSIS — K219 Gastro-esophageal reflux disease without esophagitis: Secondary | ICD-10-CM

## 2015-07-19 DIAGNOSIS — M87180 Osteonecrosis due to drugs, jaw: Secondary | ICD-10-CM

## 2015-07-19 MED ORDER — ALPRAZOLAM 0.25 MG PO TABS: 0.2500 mg | ORAL_TABLET | Freq: Every evening | ORAL | Status: DC | PRN

## 2015-07-19 NOTE — Telephone Encounter (Signed)
  Patient should be seen.  Is Logan Perez available?  M

## 2015-07-19 NOTE — Progress Notes (Signed)
Patient here today as acute add on for diarrhea.  States he has steroids 3 days out of 7 and  in the past has had diarrhea on the off days.  Now is having diarrhea on the off steroid days.  States he is having SOB.  Patient is having dry mouth and is interfering with eating.  Also having heart palpitations. Having cough also.  Further complains of dizziness that he feels is due to diarrhea and being dehydration.  Also states he is more shaky than usual.  Sweating profusely during diarrhea episodes.

## 2015-07-19 NOTE — Telephone Encounter (Signed)
Called pt regarding night sweats, GI issues having more BM's being loose stools going into watery stools, sweating at night has increased.  Pt is concerned a lot of it is related to not taking ALPRAZolam (XANAX) 0.25 MG tablet.  Had some lip numbness on upper lip.  All these new symptoms have started since stopping the Xanax a week ago 7-8 days of not taking.  Waking up at night and mind is racing over minor issues.  Pt is not sure if these symptoms are related to the stopping of xanax or the dexamethasone dosage.  Will inform MD and speak with pt again

## 2015-07-19 NOTE — Telephone Encounter (Signed)
Patient is on steroids right now, dexamethasone, and he is experiencing GI problems, profuse sweating, trouble sleeping. Please advise. [phone].

## 2015-07-19 NOTE — Progress Notes (Signed)
Logan Perez  [phone]  [phone]     Logan Perez DOB: 29-Jul-1946  MR#: [id number]  YCX#:[id number]  Patient Care Team: Sherrin Daisy, MD as PCP - General (Family Medicine)  CHIEF COMPLAINT:  Chief Complaint  Patient presents with  . Acute Visit   Related to diarrhea, night sweats, and insomnia.  INTERVAL HISTORY:  Patient is here as an acute add on regarding increasing diarrhea, night sweats, and insomnia. Patient reports that diarrhea has been progressively worsening and he feels as if he is getting dehydrated. He reports night sweats are most likely related to the fact that he can't sleep. He typically takes Xanax 0.25 mg at bedtime for sleep but is out of the medication. Patient was last evaluated in July 2016 by Dr. Mike Gip. He is a significant past medical history of multiple myeloma status post type [facility] stem cell transplant with relapse. He is currently on Revlimid with Decadron weekly. He has nearly completed his third week of this cycle of Revlimid, he reports continuing with Decadron 20 mg on Sunday. Per Dr. Kem Parkinson most recent note Decadron should have been decreased to 10 mg on Sunday.  REVIEW OF SYSTEMS:   Review of Systems  Constitutional: Negative for fever, chills, weight loss, malaise/fatigue and diaphoresis.       Night sweats  HENT: Negative for congestion, ear discharge, ear pain, hearing loss, nosebleeds, sore throat and tinnitus.   Eyes: Negative for blurred vision, double vision, photophobia, pain, discharge and redness.  Respiratory: Negative for cough, hemoptysis, sputum production, shortness of breath, wheezing and stridor.   Cardiovascular: Negative for chest pain, palpitations, orthopnea, claudication, leg swelling and PND.  Gastrointestinal: Positive for diarrhea. Negative for heartburn, nausea, vomiting, abdominal pain, constipation, blood in stool and melena.  Genitourinary: Negative.     Musculoskeletal: Negative.   Skin: Negative.   Neurological: Negative for dizziness, tingling, focal weakness, seizures, weakness and headaches.  Endo/Heme/Allergies: Does not bruise/bleed easily.  Psychiatric/Behavioral: Negative for depression. The patient has insomnia. The patient is not nervous/anxious.     As per HPI. Otherwise, a complete review of systems is negatve.  ONCOLOGY HISTORY:  No history exists.    PAST MEDICAL HISTORY: Past Medical History  Diagnosis Date  . Pulmonary embolism ([place])   . Stroke ([place])   . GERD (gastroesophageal reflux disease)   . Hypertension   . Atrial fibrillation ([place])   . HLD (hyperlipidemia)   . Multiple myeloma ([place])   . Elevated PSA   . BPH (benign prostatic hyperplasia)   . Difficulty voiding     PAST SURGICAL HISTORY: Past Surgical History  Procedure Laterality Date  . Back surgery  1994  . Knee arthroscopy Left 1992  . Pain pump implantation  2012  . Stem cell implant  2008    [facility]    FAMILY HISTORY Family History  Problem Relation Age of Onset  . Cancer Father     throat  . Bladder Cancer Neg Hx   . Kidney disease Sister   . Prostate cancer Neg Hx   . Stroke      GYNECOLOGIC HISTORY:  No LMP for male patient.     ADVANCED DIRECTIVES:    HEALTH MAINTENANCE: Social History  Substance Use Topics  . Smoking status: Former Smoker -- 2.50 packs/day for 35 years    Types: Cigarettes  . Smokeless tobacco: Not on file     Comment: Quit 40 years ago  . Alcohol Use: No  Colonoscopy:  PAP:  Bone density:  Lipid panel:  Allergies  Allergen Reactions  . Zometa [Zoledronic Acid] Other (See Comments)    Osteonecrosis of the jaw  . Rivaroxaban Rash    Current Outpatient Prescriptions  Medication Sig Dispense Refill  . ALPRAZolam (XANAX) 0.25 MG tablet TK 1 T PO  Q 8 H PRF ANXIETY  3  . atorvastatin (LIPITOR) 10 MG tablet Take by mouth.    . baclofen (LIORESAL) 10 MG tablet Take by mouth.    . calcium  citrate-vitamin D (CITRACAL+D) 315-200 MG-UNIT per tablet Take by mouth.    . cetirizine (ZYRTEC) 10 MG tablet Take by mouth.    . Cholecalciferol (VITAMIN D3) 2000 UNITS capsule Take by mouth.    . Cyanocobalamin (RA VITAMIN B-12 TR) 1000 MCG TBCR Take by mouth.    . dexamethasone (DECADRON) 4 MG tablet TAKE 5 TABLETS BY MOUTH ONCE A WEEK. 21 tablet 0  . diltiazem (CARDIZEM CD) 120 MG 24 hr capsule TAKE 1 CAPSULE ONCE DAILY    . diltiazem (CARDIZEM) 60 MG tablet Take 60 mg by mouth.     Arne Cleveland 5 MG TABS tablet TK 1 T PO TWICE DAILY  3  . ibuprofen (ADVIL,MOTRIN) 200 MG tablet Take by mouth.    [name] ketoconazole (NIZORAL) 2 % cream Apply topically.    [name] lenalidomide (REVLIMID) 15 MG capsule Take 1 capsule (15 mg total) by mouth daily. 3 weeks on and 1 week off. 21 capsule 0  . loperamide (IMODIUM) 2 MG capsule Take by mouth.    . naproxen sodium (RA NAPROXEN SODIUM) 220 MG tablet Take by mouth.    . potassium chloride SA (K-DUR,KLOR-CON) 20 MEQ tablet Take 1 tablet (20 mEq total) by mouth 2 (two) times daily. 180 tablet 0  . ranitidine (ZANTAC) 150 MG tablet Take by mouth.    . tamsulosin (FLOMAX) 0.4 MG CAPS capsule Take 1 capsule (0.4 mg total) by mouth daily. 90 capsule 3  . ALPRAZolam (XANAX) 0.25 MG tablet Take 1 tablet (0.25 mg total) by mouth at bedtime as needed for anxiety. 30 tablet 3  . ketoconazole (NIZORAL) 2 % cream APP EXT AA QD  0  . omeprazole (PRILOSEC) 20 MG capsule Take by mouth.    . vitamin B-12 (CYANOCOBALAMIN) 1000 MCG tablet TK 1 T PO QD  0   No current facility-administered medications for this visit.    OBJECTIVE: BP 157/91 mmHg  Pulse 99  Temp(Src) 95.6 F (35.3 C) (Tympanic)  Wt 157 lb 4 oz (71.328 kg)   Body mass index is 24.62 kg/(m^2).    ECOG FS:1 - Symptomatic but completely ambulatory  General: Well-developed, well-nourished, no acute distress. Eyes: Pink conjunctiva, anicteric sclera. HEENT: Normocephalic, moist mucous membranes, clear  oropharnyx. Lungs: Clear to auscultation bilaterally. Heart: Regular rate and rhythm. No rubs, murmurs, or gallops. Abdomen: Soft, nontender, nondistended. No organomegaly noted, normoactive bowel sounds. Musculoskeletal: No edema, cyanosis, or clubbing. Neuro: Alert, answering all questions appropriately. Cranial nerves grossly intact. Skin: No rashes or petechiae noted. Psych: Normal affect.   LAB RESULTS:  No visits with results within 3 Day(s) from this visit. Latest known visit with results is:  Appointment on 07/14/2015  Component Date Value Ref Range Status  . WBC 07/14/2015 6.6  3.8 - 10.6 K/uL Final  . RBC 07/14/2015 3.10* 4.40 - 5.90 MIL/uL Final  . Hemoglobin 07/14/2015 11.0* 13.0 - 18.0 g/dL Final  . HCT 07/14/2015 32.0* 40.0 - 52.0 % Final  .  MCV 07/14/2015 103.2* 80.0 - 100.0 fL Final  . MCH 07/14/2015 35.6* 26.0 - 34.0 pg Final  . MCHC 07/14/2015 34.5  32.0 - 36.0 g/dL Final  . RDW 07/14/2015 16.5* 11.5 - 14.5 % Final  . Platelets 07/14/2015 122* 150 - 440 K/uL Final  . Neutrophils Relative % 07/14/2015 81   Final  . Neutro Abs 07/14/2015 5.3  1.4 - 6.5 K/uL Final  . Lymphocytes Relative 07/14/2015 7   Final  . Lymphs Abs 07/14/2015 0.4* 1.0 - 3.6 K/uL Final  . Monocytes Relative 07/14/2015 8   Final  . Monocytes Absolute 07/14/2015 0.5  0.2 - 1.0 K/uL Final  . Eosinophils Relative 07/14/2015 4   Final  . Eosinophils Absolute 07/14/2015 0.3  0 - 0.7 K/uL Final  . Basophils Relative 07/14/2015 0   Final  . Basophils Absolute 07/14/2015 0.0  0 - 0.1 K/uL Final  . Sodium 07/14/2015 137  135 - 145 mmol/L Final  . Potassium 07/14/2015 3.4* 3.5 - 5.1 mmol/L Final  . Chloride 07/14/2015 101  101 - 111 mmol/L Final  . CO2 07/14/2015 27  22 - 32 mmol/L Final  . Glucose, Bld 07/14/2015 113* 65 - 99 mg/dL Final  . BUN 07/14/2015 22* 6 - 20 mg/dL Final  . Creatinine, Ser 07/14/2015 1.28* 0.61 - 1.24 mg/dL Final  . Calcium 07/14/2015 9.0  8.9 - 10.3 mg/dL Final  . Total  Protein 07/14/2015 6.1* 6.5 - 8.1 g/dL Final  . Albumin 07/14/2015 3.5  3.5 - 5.0 g/dL Final  . AST 07/14/2015 15  15 - 41 U/L Final  . ALT 07/14/2015 22  17 - 63 U/L Final  . Alkaline Phosphatase 07/14/2015 56  38 - 126 U/L Final  . Total Bilirubin 07/14/2015 1.1  0.3 - 1.2 mg/dL Final  . GFR calc non Af Amer 07/14/2015 55* >60 mL/min Final  . GFR calc Af Amer 07/14/2015 >60  >60 mL/min Final   Comment: (NOTE) The eGFR has been calculated using the CKD EPI equation. This calculation has not been validated in all clinical situations. eGFR's persistently <60 mL/min signify possible Chronic Kidney Disease.   . Anion gap 07/14/2015 9  5 - 15 Final  . Kappa free light chain 07/14/2015 255.45* 3.30 - 19.40 mg/L Final  . Lamda free light chains 07/14/2015 10.62  5.71 - 26.30 mg/L Final  . Kappa, lamda light chain ratio 07/14/2015 24.05* 0.26 - 1.65 Final   Comment: (NOTE) Performed At: [facility] [place], [place] [id number] Lindon Romp MD GP:[id number]     STUDIES: No results found.  ASSESSMENT:  Multiple myeloma. Diarrhea. Insomnia.  PLAN:   1. Multiple myeloma. Patient is currently on Revlimid 10 mg daily 3 weeks on and one-week off as well as Decadron every Sunday. Patient recently had reevaluation of free light chains. Discussed results with patient and will speak with Dr. Mike Gip regarding follow-up.   2. Diarrhea. Stool cultures were reported as negative however CDiff reported as positive. Started patient on metronidazole 500 mg 3 times a day for 10 days. Dr. Kem Parkinson nurse, Theadora Rama, has called and informed patient to pick prescription up from pharmacy. 3. Insomnia. Patient typically takes 0.25 mg of Xanax to sleep at night. He has been out of this for several days. We will refill the 30 day supply with 2 refills.  We'll schedule follow-up this week with Dr. Mike Gip at patient request to discuss lab results.  Patient expressed understanding and  was in agreement  with this plan. He also understands that He can call clinic at any time with any questions, concerns, or complaints.   Dr. Grayland Ormond was available for consultation and review of plan of care for this patient.   Evlyn Kanner, NP   07/19/2015 4:20 PM

## 2015-07-19 NOTE — Telephone Encounter (Signed)
Called pt per md pt to see NP today at 2:30 regarding symtoms.  Pt verbalized an understanding

## 2015-07-20 ENCOUNTER — Telehealth: Payer: Self-pay

## 2015-07-20 ENCOUNTER — Other Ambulatory Visit: Payer: Self-pay | Admitting: *Deleted

## 2015-07-20 DIAGNOSIS — R197 Diarrhea, unspecified: Secondary | ICD-10-CM

## 2015-07-20 LAB — CLOSTRIDIUM DIFFICILE BY PCR: Toxigenic C. Difficile by PCR: POSITIVE — AB

## 2015-07-20 MED ORDER — METRONIDAZOLE 500 MG PO TABS: 500.0000 mg | ORAL_TABLET | Freq: Three times a day (TID) | ORAL | Status: DC

## 2015-07-20 NOTE — Telephone Encounter (Signed)
Called pt per Magda Paganini, pt is positive for C-ciff. And she would be sending in medication.  Pt verbalized an understanding and stated he will be checking in with pharmacy

## 2015-07-21 ENCOUNTER — Inpatient Hospital Stay (HOSPITAL_BASED_OUTPATIENT_CLINIC_OR_DEPARTMENT_OTHER): Payer: Medicare Other | Admitting: Hematology and Oncology

## 2015-07-21 ENCOUNTER — Other Ambulatory Visit: Payer: Self-pay

## 2015-07-21 VITALS — BP 130/64 | HR 95 | Temp 95.8°F | Resp 18 | Ht 67.0 in | Wt 157.0 lb

## 2015-07-21 DIAGNOSIS — R197 Diarrhea, unspecified: Secondary | ICD-10-CM

## 2015-07-21 DIAGNOSIS — T50995S Adverse effect of other drugs, medicaments and biological substances, sequela: Secondary | ICD-10-CM

## 2015-07-21 DIAGNOSIS — C9002 Multiple myeloma in relapse: Secondary | ICD-10-CM

## 2015-07-21 DIAGNOSIS — M87180 Osteonecrosis due to drugs, jaw: Secondary | ICD-10-CM

## 2015-07-21 DIAGNOSIS — G8921 Chronic pain due to trauma: Secondary | ICD-10-CM

## 2015-07-21 DIAGNOSIS — A047 Enterocolitis due to Clostridium difficile: Secondary | ICD-10-CM | POA: Diagnosis not present

## 2015-07-21 DIAGNOSIS — Z79899 Other long term (current) drug therapy: Secondary | ICD-10-CM

## 2015-07-21 DIAGNOSIS — C9000 Multiple myeloma not having achieved remission: Secondary | ICD-10-CM

## 2015-07-21 DIAGNOSIS — R634 Abnormal weight loss: Secondary | ICD-10-CM

## 2015-07-21 DIAGNOSIS — Z9221 Personal history of antineoplastic chemotherapy: Secondary | ICD-10-CM | POA: Diagnosis not present

## 2015-07-21 DIAGNOSIS — G894 Chronic pain syndrome: Secondary | ICD-10-CM

## 2015-07-21 DIAGNOSIS — A0472 Enterocolitis due to Clostridium difficile, not specified as recurrent: Secondary | ICD-10-CM | POA: Insufficient documentation

## 2015-07-21 DIAGNOSIS — Z87311 Personal history of (healed) other pathological fracture: Secondary | ICD-10-CM

## 2015-07-21 DIAGNOSIS — Z9481 Bone marrow transplant status: Secondary | ICD-10-CM | POA: Diagnosis not present

## 2015-07-21 DIAGNOSIS — F428 Other obsessive-compulsive disorder: Secondary | ICD-10-CM

## 2015-07-21 MED ORDER — PAIN MANAGEMENT IT PUMP REFILL: 1.0000 | Freq: Once | INTRATHECAL | Status: DC

## 2015-07-21 NOTE — Progress Notes (Signed)
Reports diarreah has improved.  Started medication for C-diff on 10/12.  Some nausea

## 2015-07-21 NOTE — Progress Notes (Signed)
[facility] day:  07/21/2015  Chief Complaint: Logan Perez is a 69 y.o. male with mutiple myeloma status post autologous stem cell transplant and relpase who is seen for reassessment prior to his next cycle of Revlimid and Decadron.  HPI: The patient was last seen in the medical oncology clinic on 06/02/2015.  At that time, he was doing fairly well. He was taking his Decadron on Sundays. He started his next cycle of Revlimid on 06/04/2015.  He remains on his pain pump for history of a compression fracture of his back. He has had no interval infections.   He had follow-up labs on 07/14/2015.  CBC revealed a hematocrit of 32, hemoglobin 11, MCV 103 22,000, white count 6600 with an ANC of 5300.  Comprehensive metabolic panel included a creatinine of 1.28.  Protein was 6.1.  Calcium was 9.0.  Kappa free light chains were 255.45 with a ratio of 24.05.  Kappa free light chains were 255.45 with a ratio of 24.05 on 07/14/2015.  Symptomatically, the patient has had diarrhea 3-4 times a night. He still sample was collected on a 07/19/2015. He states that he has lost 10 pounds 10 days secondary to dehydration.  Stool studies recently returned positive for C. difficile toxin. He was started on Flagyl today.  He has used some Imodium.  The patient states that his pain pump takes care of about 90% of his pain. He will often use Aleve or Advil. He states that he didn't get his prescription for Xanax. He wakes up once a night.  He ran out about 8-9 days ago. It was refilled to yesterday. Without Xanax, he "obsesses about things".  He continues his Revlimid and Decadron.  Decadron is 20 mg on Sunday mornings. Regarding his Revlimid, his last dose was on Thursday. He is off a week.  Past Medical History  Diagnosis Date  . Pulmonary embolism ([place])   . Stroke ([place])   . GERD (gastroesophageal reflux disease)   . Hypertension   . Atrial fibrillation ([place])   .  HLD (hyperlipidemia)   . Multiple myeloma ([place])   . Elevated PSA   . BPH (benign prostatic hyperplasia)   . Difficulty voiding     Past Surgical History  Procedure Laterality Date  . Back surgery  1994  . Knee arthroscopy Left 1992  . Pain pump implantation  2012  . Stem cell implant  2008    [facility]    Family History  Problem Relation Age of Onset  . Cancer Father     throat  . Bladder Cancer Neg Hx   . Kidney disease Sister   . Prostate cancer Neg Hx   . Stroke      Social History:  reports that he has quit smoking. His smoking use included Cigarettes. He has a 87.5 pack-year smoking history. He does not have any smokeless tobacco history on file. He reports that he does not drink alcohol or use illicit drugs.  His wife is at home.  The patient is alone today.  Allergies:  Allergies  Allergen Reactions  . Zometa [Zoledronic Acid] Other (See Comments)    Osteonecrosis of the jaw  . Rivaroxaban Rash    Current Medications: Current Outpatient Prescriptions  Medication Sig Dispense Refill  . ALPRAZolam (XANAX) 0.25 MG tablet Take 1 tablet (0.25 mg total) by mouth at bedtime as needed for anxiety. 30 tablet 3  . atorvastatin (LIPITOR) 10 MG tablet Take by  mouth.    . baclofen (LIORESAL) 10 MG tablet Take by mouth.    . calcium citrate-vitamin D (CITRACAL+D) 315-200 MG-UNIT per tablet Take by mouth.    . cetirizine (ZYRTEC) 10 MG tablet Take by mouth.    . Cholecalciferol (VITAMIN D3) 2000 UNITS capsule Take by mouth.    . Cyanocobalamin (RA VITAMIN B-12 TR) 1000 MCG TBCR Take by mouth.    . diltiazem (CARDIZEM CD) 120 MG 24 hr capsule TAKE 1 CAPSULE ONCE DAILY    . diltiazem (CARDIZEM) 60 MG tablet Take 60 mg by mouth.     Arne Cleveland 5 MG TABS tablet TK 1 T PO TWICE DAILY  3  . ibuprofen (ADVIL,MOTRIN) 200 MG tablet Take by mouth.    . lenalidomide (REVLIMID) 15 MG capsule Take 1 capsule (15 mg total) by mouth daily. 3 weeks on and 1 week off. 21 capsule 0  . loperamide  (IMODIUM) 2 MG capsule Take by mouth.    . metroNIDAZOLE (FLAGYL) 500 MG tablet Take 1 tablet (500 mg total) by mouth 3 (three) times daily. (Patient not taking: Reported on 08/04/2015) 30 tablet 0  . naproxen sodium (RA NAPROXEN SODIUM) 220 MG tablet Take by mouth.    [name] omeprazole (PRILOSEC) 20 MG capsule Take by mouth.    . potassium chloride SA (K-DUR,KLOR-CON) 20 MEQ tablet Take 1 tablet (20 mEq total) by mouth 2 (two) times daily. 180 tablet 0  . ranitidine (ZANTAC) 150 MG tablet Take by mouth.    . tamsulosin (FLOMAX) 0.4 MG CAPS capsule Take 1 capsule (0.4 mg total) by mouth daily. 90 capsule 3  . dexamethasone (DECADRON) 4 MG tablet TAKE 5 TABLETS BY MOUTH 1 TIME A WEEK 21 tablet 0  . [START ON 08/26/2015] PAIN MANAGEMENT IT PUMP REFILL 1 each by Intrathecal route once. Medication: PF Fentanyl 1,500.0 mcg/ml PF Bupivicaine 30.0 mg/ml PF Clonidine 300.77mg/ml Total Volume: 40 ml Needed by 08-26-15 @ 1340 1 each 0  . pomalidomide (POMALYST) 4 MG capsule Take 1 capsule (4 mg total) by mouth daily. Take with water on days 1-21. Repeat every 28 days. 21 capsule 0   No current facility-administered medications for this visit.    Review of Systems:  GENERAL:  Fatigue secondary to diarrhea.  No fevers, sweats or weight loss. PERFORMANCE STATUS (ECOG): 1 HEENT:  No visual changes, runny nose, sore throat, mouth sores or tenderness. Lungs: No shortness of breath or cough.  No hemoptysis. Cardiac:  No chest pain, palpitations, orthopnea, or PND. GI:  Diarrhea.  No nausea, vomiting, constipation, melena or hematochezia. GU:  No urgency, frequency, dysuria, or hematuria. Musculoskeletal:  Compression fracture of back for which he remains on a pain pump (chronic) and Aleve prn.  No joint pain.  No muscle tenderness. Extremities:  No pain or swelling. Skin:  Fragile skin.  No rashes or skin changes. Neuro:  No headache, numbness or weakness, balance or coordination issues. Endocrine:  No  diabetes, thyroid issues, hot flashes or night sweats. Psych:  No mood changes, depression or anxiety. Pain:  No focal pain. Review of systems:  All other systems reviewed and found to be negative.   Physical Exam: Blood pressure 130/64, pulse 95, temperature 95.8 F (35.4 C), temperature source Tympanic, resp. rate 18, height '5\' 7"'  (1.702 m), weight 156 lb 15.5 oz (71.2 kg). GENERAL:  Well developed, well nourished, sitting comfortably in the exam room in no acute distress. MENTAL STATUS:  Alert and oriented to person, place and  time. HEAD:  Pearline Cables short hair with goatee.  Normocephalic, atraumatic, face symmetric, no Cushingoid features. EYES:  Glasses.  Blue eyes.  Pupils equal round and reactive to light and accomodation.  No conjunctivitis or scleral icterus. ENT:  Oropharynx clear without lesion.  Hearing aide.  Tongue normal. Mucous membranes moist.  RESPIRATORY:  Clear to auscultation without rales, wheezes or rhonchi. CARDIOVASCULAR:  Regular rate and rhythm without murmur, rub or gallop. ABDOMEN:  RUQ pain pump.  Soft, non-tender, with active bowel sounds, and no hepatosplenomegaly.  No masses. SKIN:  Ecchymosis arms (left > right).  No rashes, ulcers or lesions. EXTREMITIES: No edema, no skin discoloration or tenderness.  No palpable cords. LYMPH NODES: No palpable cervical, supraclavicular, axillary or inguinal adenopathy  NEUROLOGICAL: Unremarkable. PSYCH:  Anxious.  Orders Only on 07/20/2015  Component Date Value Ref Range Status  . Toxigenic C Difficile by pcr 07/19/2015 POSITIVE* NEGATIVE Final   Comment: CRITICAL RESULT CALLED TO, READ BACK BY AND VERIFIED WITH: DR HERRING AT 1550 07/20/2015   . Specimen Description 07/19/2015 STOOL   Final  . Special Requests 07/19/2015  STOOL   Final  . Culture 07/19/2015    Final                   Value:NO SALMONELLA OR SHIGELLA ISOLATED NO CAMPYLOBACTER DETECTED No Pathogenic E. coli detected   . Report Status 07/19/2015  07/24/2015 FINAL   Final    Assessment:  Logan Perez is a 69 y.o. male with stage III mutiple myeloma.  He initially presented with progressive back pain beginning in 12/2006.  MRI revealed "spots and compression fractures".  He began Velcade, thalidomide, and Decadron.  In 08/2007, he underwent high dose chemotherapy and autologous stem cell transplant.  He recurred with a rising M-spike (2.7) with repeat M spike (1.7 gm/dl) in 03/2010.  He was initially treated with Velcade (02/08/2010 - 05/10/2010).  He then began Revlimid (15 mg 3 weeks on/1 week off) and Decadron (40 mg on day 1, 8, 15, 22).  Because of significant side effect with Decadron his dose was decreased to 10 mg once a week in 07/2010.    He has been on maintenance Revlimid.  Revlimid was initially10 mg 3 weeks on/1 week off.  This was changed to 10 mg 2 weeks on/2 weeks off secondary to right nipple tenderness.  He has been back on 10 mg 3 weeks on/1 week off with Decadron 10 mg a week (on Sundays).  He is currently on Revlamid 15 mg 3 weeks on and 1 week off with Decadron on Sundays.  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  He develoed herpes zoster in 04/2008.  He developed a pulmonary embolism in 05/2013.  He was initially on Xarelto, but is now on Eliquis.  He had an episode of pneumonia around this time requiring a brief admission.  He developed severe lower leg cramps on 08/07/2014 secondary to hypokalemia. Duplex was negative.   Over the past year his SPEP has revealed no monoclonal protein (last 04/21/2015).  Free light chains have been monitored.  Kappa free light chains were 18.54 on 11/21/2013, 18.37 on 02/20/2014, 18.93 on 05/22/2014, 32.58 (high; normal ratio 1.27) on 08/21/2014, 51.53 (high; elevated ratio of 2.12) on 11/13/2014, 28.08 (ratio 1.73) on 12/09/2014, 23.71 (ratio 2.17) on 01/18/2015, 92.93 (ratio 9.49) on 04/21/2015, 93.44 (ratio 10.28) on  05/26/2015, and 255.45 (ratio of 24.05) on 07/14/2015.  Bone survey  on 12/08/2014 was stable.  Symptomatically, he has diarrhea.  Stool studies were positive for C difficile.  He started Flagyl today.  He has a chronic indwelling pain pump.  Kappa free light chains are increasing.  Plan: 1. Review labs from10/02/2015. 2. Complete Flagyl for C difficile. 3. Continue Revlimid 15 mg (3 weeks on/1 week off) and Decadron on Sundays. 4. Preauth Velcade.  Consider switch to Pomalyst.   5. RTC in 2 weeks for MD assess and labs (CBC with diff, CMP, SPEP, free light chains).   Lequita Asal, MD  07/21/2015, 10:51 AM

## 2015-07-23 ENCOUNTER — Other Ambulatory Visit: Payer: Self-pay

## 2015-07-23 MED ORDER — PAIN MANAGEMENT IT PUMP REFILL: 1.0000 | Freq: Once | INTRATHECAL | Status: DC

## 2015-07-24 LAB — STOOL CULTURE

## 2015-08-01 ENCOUNTER — Other Ambulatory Visit: Payer: Self-pay | Admitting: Hematology and Oncology

## 2015-08-02 ENCOUNTER — Other Ambulatory Visit: Payer: Self-pay

## 2015-08-02 ENCOUNTER — Telehealth: Payer: Self-pay | Admitting: *Deleted

## 2015-08-02 DIAGNOSIS — C9000 Multiple myeloma not having achieved remission: Secondary | ICD-10-CM

## 2015-08-02 NOTE — Telephone Encounter (Signed)
If patient still having diarrhea, will recheck stool. 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

## 2015-08-02 NOTE — Telephone Encounter (Signed)
Asking if he needs to have his stool rechecked for c diff now that he has completed abx

## 2015-08-04 ENCOUNTER — Inpatient Hospital Stay: Payer: Medicare Other

## 2015-08-04 ENCOUNTER — Telehealth: Payer: Self-pay

## 2015-08-04 ENCOUNTER — Inpatient Hospital Stay (HOSPITAL_BASED_OUTPATIENT_CLINIC_OR_DEPARTMENT_OTHER): Payer: Medicare Other | Admitting: Hematology and Oncology

## 2015-08-04 VITALS — BP 138/74 | HR 78 | Temp 95.0°F | Resp 18 | Ht 67.0 in | Wt 162.0 lb

## 2015-08-04 DIAGNOSIS — T50995S Adverse effect of other drugs, medicaments and biological substances, sequela: Secondary | ICD-10-CM

## 2015-08-04 DIAGNOSIS — C9002 Multiple myeloma in relapse: Secondary | ICD-10-CM | POA: Diagnosis not present

## 2015-08-04 DIAGNOSIS — M87180 Osteonecrosis due to drugs, jaw: Secondary | ICD-10-CM

## 2015-08-04 DIAGNOSIS — Z9481 Bone marrow transplant status: Secondary | ICD-10-CM | POA: Diagnosis not present

## 2015-08-04 DIAGNOSIS — G8921 Chronic pain due to trauma: Secondary | ICD-10-CM

## 2015-08-04 DIAGNOSIS — F17210 Nicotine dependence, cigarettes, uncomplicated: Secondary | ICD-10-CM

## 2015-08-04 DIAGNOSIS — C9000 Multiple myeloma not having achieved remission: Secondary | ICD-10-CM

## 2015-08-04 DIAGNOSIS — Z86711 Personal history of pulmonary embolism: Secondary | ICD-10-CM

## 2015-08-04 DIAGNOSIS — Z9221 Personal history of antineoplastic chemotherapy: Secondary | ICD-10-CM | POA: Diagnosis not present

## 2015-08-04 DIAGNOSIS — E785 Hyperlipidemia, unspecified: Secondary | ICD-10-CM

## 2015-08-04 DIAGNOSIS — Z7901 Long term (current) use of anticoagulants: Secondary | ICD-10-CM

## 2015-08-04 DIAGNOSIS — K219 Gastro-esophageal reflux disease without esophagitis: Secondary | ICD-10-CM

## 2015-08-04 DIAGNOSIS — I10 Essential (primary) hypertension: Secondary | ICD-10-CM

## 2015-08-04 LAB — COMPREHENSIVE METABOLIC PANEL
ALT: 30 U/L (ref 17–63)
AST: 32 U/L (ref 15–41)
Albumin: 3.2 g/dL — ABNORMAL LOW (ref 3.5–5.0)
Alkaline Phosphatase: 44 U/L (ref 38–126)
Anion gap: 10 (ref 5–15)
BUN: 22 mg/dL — ABNORMAL HIGH (ref 6–20)
CO2: 25 mmol/L (ref 22–32)
Calcium: 8.9 mg/dL (ref 8.9–10.3)
Chloride: 101 mmol/L (ref 101–111)
Creatinine, Ser: 1.13 mg/dL (ref 0.61–1.24)
GFR calc Af Amer: >60 mL/min (ref >60)
GFR calc non Af Amer: >60 mL/min (ref >60)
Glucose, Bld: 116 mg/dL — ABNORMAL HIGH (ref 65–99)
Potassium: 3.4 mmol/L — ABNORMAL LOW (ref 3.5–5.1)
Sodium: 136 mmol/L (ref 135–145)
Total Bilirubin: 0.6 mg/dL (ref 0.3–1.2)
Total Protein: 6.2 g/dL — ABNORMAL LOW (ref 6.5–8.1)

## 2015-08-04 LAB — CBC WITH DIFFERENTIAL/PLATELET
Basophils Absolute: 0 10*3/uL (ref 0–0.1)
Basophils Relative: 1 %
Eosinophils Absolute: 0.2 10*3/uL (ref 0–0.7)
Eosinophils Relative: 2 %
HCT: 31.7 % — ABNORMAL LOW (ref 40.0–52.0)
Hemoglobin: 10.9 g/dL — ABNORMAL LOW (ref 13.0–18.0)
Lymphocytes Relative: 9 %
Lymphs Abs: 0.7 10*3/uL — ABNORMAL LOW (ref 1.0–3.6)
MCH: 35.4 pg — ABNORMAL HIGH (ref 26.0–34.0)
MCHC: 34.4 g/dL (ref 32.0–36.0)
MCV: 102.8 fL — ABNORMAL HIGH (ref 80.0–100.0)
Monocytes Absolute: 0.5 10*3/uL (ref 0.2–1.0)
Monocytes Relative: 7 %
Neutro Abs: 6.1 10*3/uL (ref 1.4–6.5)
Neutrophils Relative %: 81 %
Platelets: 165 10*3/uL (ref 150–440)
RBC: 3.09 MIL/uL — ABNORMAL LOW (ref 4.40–5.90)
RDW: 16.9 % — ABNORMAL HIGH (ref 11.5–14.5)
WBC: 7.6 10*3/uL (ref 3.8–10.6)

## 2015-08-04 NOTE — Progress Notes (Signed)
Logan Perez day:  08/04/2015  Chief Complaint: Logan Perez is a 69 y.o. 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  HPI: The patient was last seen in the medical oncology Perez on 07/21/2015.  At that time, he was having frequent bowel movements.  Stool studies were positive for C difficile.  He had lost 10 pounds.  He started Flagyl that day.  During the interim, he has completed his Flagyl on Thursday or Friday (10/20 or 07/30/2015). He is back to normal. Bowel movements are regular.  He has put on 4 pounds. He denies any dizziness.  Regarding his Revlimid and Decadron, he began last week (07/30/2015) after his week off. He continues to take his Decadron on Sundays.  Past Medical History  Diagnosis Date  . Pulmonary embolism ([place])   . Stroke ([place])   . GERD (gastroesophageal reflux disease)   . Hypertension   . Atrial fibrillation ([place])   . HLD (hyperlipidemia)   . Multiple myeloma ([place])   . Elevated PSA   . BPH (benign prostatic hyperplasia)   . Difficulty voiding     Past Surgical History  Procedure Laterality Date  . Back surgery  1994  . Knee arthroscopy Left 1992  . Pain pump implantation  2012  . Stem cell implant  2008    [facility]    Family History  Problem Relation Age of Onset  . Cancer Father     throat  . Bladder Cancer Neg Hx   . Kidney disease Sister   . Prostate cancer Neg Hx   . Stroke      Social History:  reports that he has quit smoking. His smoking use included Cigarettes. He has a 87.5 pack-year smoking history. He does not have any smokeless tobacco history on file. He reports that he does not drink alcohol or use illicit drugs.  The patient is accompanied by his wife, Logan Perez, today.  Allergies:  Allergies  Allergen Reactions  . Zometa  [Zoledronic Acid] Other (See Comments)    Osteonecrosis of the jaw  . Rivaroxaban Rash    Current Medications: Current Outpatient Prescriptions  Medication Sig Dispense Refill  . ALPRAZolam (XANAX) 0.25 MG tablet Take 1 tablet (0.25 mg total) by mouth at bedtime as needed for anxiety. 30 tablet 3  . atorvastatin (LIPITOR) 10 MG tablet Take by mouth.    . baclofen (LIORESAL) 10 MG tablet Take by mouth.    . calcium citrate-vitamin D (CITRACAL+D) 315-200 MG-UNIT per tablet Take by mouth.    . cetirizine (ZYRTEC) 10 MG tablet Take by mouth.    . Cholecalciferol (VITAMIN D3) 2000 UNITS capsule Take by mouth.    . Cyanocobalamin (RA VITAMIN B-12 TR) 1000 MCG TBCR Take by mouth.    . dexamethasone (DECADRON) 4 MG tablet TAKE 5 TABLETS BY MOUTH 1 TIME A WEEK 21 tablet 0  . diltiazem (CARDIZEM CD) 120 MG 24 hr capsule TAKE 1 CAPSULE ONCE DAILY    . diltiazem (CARDIZEM) 60 MG tablet Take 60 mg by mouth.     Logan Perez 5 MG TABS tablet TK 1 T PO TWICE DAILY  3  . ibuprofen (ADVIL,MOTRIN) 200 MG tablet Take by mouth.    . lenalidomide (REVLIMID) 15 MG capsule Take 1 capsule (15 mg total) by mouth daily. 3 weeks on  and 1 week off. 21 capsule 0  . loperamide (IMODIUM) 2 MG capsule Take by mouth.    . naproxen sodium (RA NAPROXEN SODIUM) 220 MG tablet Take by mouth.    [name] omeprazole (PRILOSEC) 20 MG capsule Take by mouth.    Logan Perez ON 08/26/2015] PAIN MANAGEMENT IT PUMP REFILL 1 each by Intrathecal route once. Medication: PF Fentanyl 1,500.0 mcg/ml PF Bupivicaine 30.0 mg/ml PF Clonidine 300.44mg/ml Total Volume: 40 ml Needed by 08-26-15 @ 1340 1 each 0  . potassium chloride SA (K-DUR,KLOR-CON) 20 MEQ tablet Take 1 tablet (20 mEq total) by mouth 2 (two) times daily. 180 tablet 0  . ranitidine (ZANTAC) 150 MG tablet Take by mouth.    . tamsulosin (FLOMAX) 0.4 MG CAPS capsule Take 1 capsule (0.4 mg total) by mouth daily. 90 capsule 3  . metroNIDAZOLE (FLAGYL) 500 MG tablet Take 1 tablet (500 mg  total) by mouth 3 (three) times daily. (Patient not taking: Reported on 08/04/2015) 30 tablet 0   No current facility-administered medications for this visit.    Review of Systems:  GENERAL:  Feels back to normal.  No fevers or sweats .  Weight up 5 pounds. PERFORMANCE STATUS (ECOG): 1 HEENT:  No visual changes, runny nose, sore throat, mouth sores or tenderness. Lungs: No shortness of breath or cough.  No hemoptysis. Cardiac:  No chest pain, palpitations, orthopnea, or PND. GI:  Diarrhea resolved.  No nausea, vomiting, diarrhea, constipation, melena or hematochezia. GU:  No urgency, frequency, dysuria, or hematuria. Musculoskeletal:  Compression fracture of back for which he remains on a pain pump (chronic).  No joint pain.  No muscle tenderness. Extremities:  No pain or swelling. Skin:  Fragile skin.  No rashes or skin changes. Neuro:  No headache, numbness or weakness, balance or coordination issues. Endocrine:  No diabetes, thyroid issues, hot flashes or night sweats. Psych:  No mood changes, depression or anxiety. Pain:  No focal pain. Review of systems:  All other systems reviewed and found to be negative.   Physical Exam: Blood pressure 138/74, pulse 78, temperature 95 F (35 C), temperature source Tympanic, resp. rate 18, height _0  (1.702 m), weight 162 lb 0.6 oz (73.5 kg). GENERAL:  Well developed, well nourished, sitting comfortably in the exam room in no acute distress. MENTAL STATUS:  Alert and oriented to person, place and time. HEAD:  GPearline Cablesshort hair with goatee.  Normocephalic, atraumatic, face symmetric, no Cushingoid features. EYES:  Glasses.  Blue eyes.  Pupils equal round and reactive to light and accomodation.  No conjunctivitis or scleral icterus. ENT:  Oropharynx clear without lesion.  Hearing aide.  Tongue normal. Mucous membranes moist.  RESPIRATORY:  Clear to auscultation without rales, wheezes or rhonchi. CARDIOVASCULAR:  Regular rate and rhythm without  murmur, rub or gallop. ABDOMEN:  RUQ pain pump.  Soft, non-tender, with active bowel sounds, and no hepatosplenomegaly.  No masses. SKIN:  No rashes, ulcers or lesions. EXTREMITIES: No edema, no skin discoloration or tenderness.  No palpable cords. LYMPH NODES: No palpable cervical, supraclavicular, axillary or inguinal adenopathy  NEUROLOGICAL: Unremarkable. PSYCH:  Appropriate.   No visits with results within 3 Day(s) from this visit. Latest known visit with results is:  Orders Only on 07/20/2015  Component Date Value Ref Range Status  . Toxigenic C Difficile by pcr 07/19/2015 POSITIVE* NEGATIVE Final   Comment: CRITICAL RESULT CALLED TO, READ BACK BY AND VERIFIED WITH: DR HERRING AT 1550 07/20/2015   . Specimen Description 07/19/2015  STOOL   Final  . Special Requests 07/19/2015  STOOL   Final  . Culture 07/19/2015    Final                   Value:NO SALMONELLA OR SHIGELLA ISOLATED NO CAMPYLOBACTER DETECTED No Pathogenic E. coli detected   . Report Status 07/19/2015 07/24/2015 FINAL   Final    Assessment:  Logan Perez is a 69 y.o. male with stage III mutiple myeloma.  He initially presented with progressive back pain beginning in 12/2006.  MRI revealed "spots and compression fractures".  He began Velcade, thalidomide, and Decadron.  In 08/2007, he underwent high dose chemotherapy and autologous stem cell transplant.  He recurred with a rising M-spike (2.7) with repeat M spike (1.7 gm/dl) in 03/2010.  He was initially treated with Velcade (02/08/2010 - 05/10/2010).  He then began Revlimid (15 mg 3 weeks on/1 week off) and Decadron (40 mg on day 1, 8, 15, 22).  Because of significant side effect with Decadron his dose was decreased to 10 mg once a week in 07/2010.    He has been on maintenance Revlimid.  Revlimid was initially10 mg 3 weeks on/1 week off.  This was changed to 10 mg 2 weeks on/2 weeks off secondary to right nipple tenderness.  He has been back on 10 mg 3 weeks  on/1 week off with Decadron 10 mg a week (on Sundays).  He is currently on Revlamid 15 mg 3 weeks on and 1 week off with Decadron on Sundays.  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  He develoed herpes zoster in 04/2008.  He developed a pulmonary embolism in 05/2013.  He was initially on Xarelto, but is now on Eliquis.  He had an episode of pneumonia around this time requiring a brief admission.  He developed severe lower leg cramps on 08/07/2014 secondary to hypokalemia. Duplex was negative.   Over the past year his SPEP has revealed no monoclonal protein (last 04/21/2015).  Free light chains have been monitored.  Kappa free light chains were 18.54 on 11/21/2013, 18.37 on 02/20/2014, 18.93 on 05/22/2014, 32.58 (high; normal ratio 1.27) on 08/21/2014, 51.53 (high; elevated ratio of 2.12) on 11/13/2014, 28.08 (ratio 1.73) on 12/09/2014, 23.71 (ratio 2.17) on 01/18/2015, 92.93 (ratio 9.49) on 04/21/2015, 93.44 (ratio 10.28) on 05/26/2015, and 255.45 (ratio of 24.05) on 07/14/2015.  Bone survey on 12/08/2014 was stable.  Symptomatically, he is back to baseline.  His C difficile + diarrhea has resolved (completed Flagyl on 07/30/2015).  He has a chronic indwelling pain pump.  Kapp free light chains are increasing.  Plan:  1. Labs today:  CBC with diff, CMP, free light chains. 2. Call patient with lab results (free light chains).  If increasing, discuss switch to Pomalyst.  Discuss prior stem cell transplant and other treatment options. 3. Continue Revlimid 15 mg (3 weeks on/1 week off) and Decadron on Sundays. 4. RTC in 1 month for MD assess and labs (CBC with diff, CMP, SPEP, free light chains).   Lequita Asal, MD  08/04/2015, 9:15 AM

## 2015-08-04 NOTE — Progress Notes (Signed)
Logan Perez is here for follow-up of multiple myeloma. Logan Perez states that he just got over a bad bout of diarrhea. He saw Georgeanne Nim, NP for this and she prescribed Flagyl. Logan Perez states that this helped a lot and his diarrhea has resolved. Logan Perez states that overall he feels good now and his appetite has been great. He denies any pain today.

## 2015-08-04 NOTE — Telephone Encounter (Signed)
Called pt per MD to let pt know his potassium level of 3.4.  Pt reports he just had a recent episode of diarreah, and is currently drinking Gatorade and will start eating potassium rich foods.  Pt reports he thinks this is the cause.  Pt verbalizes of understanding for the need to increase potassium in diet.

## 2015-08-05 LAB — KAPPA/LAMBDA LIGHT CHAINS
Kappa free light chain: 373.89 mg/L — ABNORMAL HIGH (ref 3.30–19.40)
Kappa, lambda light chain ratio: 48.31 — ABNORMAL HIGH (ref 0.26–1.65)
Lambda free light chains: 7.74 mg/L (ref 5.71–26.30)

## 2015-08-12 ENCOUNTER — Other Ambulatory Visit: Payer: Self-pay | Admitting: Hematology and Oncology

## 2015-08-12 DIAGNOSIS — C9002 Multiple myeloma in relapse: Secondary | ICD-10-CM

## 2015-08-12 MED ORDER — POMALIDOMIDE 4 MG PO CAPS: 4.0000 mg | ORAL_CAPSULE | Freq: Every day | ORAL | Status: DC

## 2015-08-16 ENCOUNTER — Other Ambulatory Visit: Payer: Self-pay

## 2015-08-16 MED ORDER — POMALIDOMIDE 4 MG PO CAPS: 4.0000 mg | ORAL_CAPSULE | Freq: Every day | ORAL | Status: DC

## 2015-08-18 ENCOUNTER — Other Ambulatory Visit: Payer: Self-pay | Admitting: Hematology and Oncology

## 2015-08-18 DIAGNOSIS — C9002 Multiple myeloma in relapse: Secondary | ICD-10-CM

## 2015-08-18 MED ORDER — POMALIDOMIDE 4 MG PO CAPS
4.0000 mg | ORAL_CAPSULE | Freq: Every day | ORAL | Status: DC
Start: 2015-08-18 — End: 2015-08-20

## 2015-08-20 ENCOUNTER — Other Ambulatory Visit: Payer: Self-pay

## 2015-08-20 MED ORDER — POMALIDOMIDE 4 MG PO CAPS: 4.0000 mg | ORAL_CAPSULE | Freq: Every day | ORAL | Status: DC

## 2015-08-25 ENCOUNTER — Encounter: Payer: Self-pay | Admitting: Hematology and Oncology

## 2015-08-25 ENCOUNTER — Inpatient Hospital Stay: Payer: Medicare Other | Attending: Hematology and Oncology

## 2015-08-25 DIAGNOSIS — R634 Abnormal weight loss: Secondary | ICD-10-CM | POA: Insufficient documentation

## 2015-08-25 DIAGNOSIS — Z9481 Bone marrow transplant status: Secondary | ICD-10-CM | POA: Insufficient documentation

## 2015-08-25 DIAGNOSIS — N400 Enlarged prostate without lower urinary tract symptoms: Secondary | ICD-10-CM | POA: Diagnosis not present

## 2015-08-25 DIAGNOSIS — Z86711 Personal history of pulmonary embolism: Secondary | ICD-10-CM | POA: Diagnosis not present

## 2015-08-25 DIAGNOSIS — Z79899 Other long term (current) drug therapy: Secondary | ICD-10-CM | POA: Diagnosis not present

## 2015-08-25 DIAGNOSIS — I10 Essential (primary) hypertension: Secondary | ICD-10-CM | POA: Diagnosis not present

## 2015-08-25 DIAGNOSIS — E785 Hyperlipidemia, unspecified: Secondary | ICD-10-CM | POA: Diagnosis not present

## 2015-08-25 DIAGNOSIS — I4891 Unspecified atrial fibrillation: Secondary | ICD-10-CM | POA: Diagnosis not present

## 2015-08-25 DIAGNOSIS — R197 Diarrhea, unspecified: Secondary | ICD-10-CM | POA: Diagnosis not present

## 2015-08-25 DIAGNOSIS — Z7901 Long term (current) use of anticoagulants: Secondary | ICD-10-CM | POA: Diagnosis not present

## 2015-08-25 DIAGNOSIS — Z9221 Personal history of antineoplastic chemotherapy: Secondary | ICD-10-CM | POA: Diagnosis not present

## 2015-08-25 DIAGNOSIS — Z8673 Personal history of transient ischemic attack (TIA), and cerebral infarction without residual deficits: Secondary | ICD-10-CM | POA: Insufficient documentation

## 2015-08-25 DIAGNOSIS — T50995S Adverse effect of other drugs, medicaments and biological substances, sequela: Secondary | ICD-10-CM | POA: Insufficient documentation

## 2015-08-25 DIAGNOSIS — Z87311 Personal history of (healed) other pathological fracture: Secondary | ICD-10-CM | POA: Diagnosis not present

## 2015-08-25 DIAGNOSIS — F17210 Nicotine dependence, cigarettes, uncomplicated: Secondary | ICD-10-CM | POA: Diagnosis not present

## 2015-08-25 DIAGNOSIS — C9002 Multiple myeloma in relapse: Secondary | ICD-10-CM | POA: Diagnosis present

## 2015-08-25 DIAGNOSIS — K219 Gastro-esophageal reflux disease without esophagitis: Secondary | ICD-10-CM | POA: Insufficient documentation

## 2015-08-25 LAB — CBC WITH DIFFERENTIAL/PLATELET
Basophils Absolute: 0 10*3/uL (ref 0–0.1)
Basophils Relative: 1 %
Eosinophils Absolute: 0 10*3/uL (ref 0–0.7)
Eosinophils Relative: 0 %
HCT: 29.6 % — ABNORMAL LOW (ref 40.0–52.0)
Hemoglobin: 10.1 g/dL — ABNORMAL LOW (ref 13.0–18.0)
Lymphocytes Relative: 9 %
Lymphs Abs: 0.4 10*3/uL — ABNORMAL LOW (ref 1.0–3.6)
MCH: 35.1 pg — ABNORMAL HIGH (ref 26.0–34.0)
MCHC: 34.2 g/dL (ref 32.0–36.0)
MCV: 102.8 fL — ABNORMAL HIGH (ref 80.0–100.0)
Monocytes Absolute: 0.7 10*3/uL (ref 0.2–1.0)
Monocytes Relative: 17 %
Neutro Abs: 3.1 10*3/uL (ref 1.4–6.5)
Neutrophils Relative %: 73 %
Platelets: 145 10*3/uL — ABNORMAL LOW (ref 150–440)
RBC: 2.88 MIL/uL — ABNORMAL LOW (ref 4.40–5.90)
RDW: 16.9 % — ABNORMAL HIGH (ref 11.5–14.5)
WBC: 4.2 10*3/uL (ref 3.8–10.6)

## 2015-08-25 LAB — COMPREHENSIVE METABOLIC PANEL
ALT: 23 U/L (ref 17–63)
AST: 16 U/L (ref 15–41)
Albumin: 3.1 g/dL — ABNORMAL LOW (ref 3.5–5.0)
Alkaline Phosphatase: 52 U/L (ref 38–126)
Anion gap: 9 (ref 5–15)
BUN: 25 mg/dL — ABNORMAL HIGH (ref 6–20)
CO2: 24 mmol/L (ref 22–32)
Calcium: 9 mg/dL (ref 8.9–10.3)
Chloride: 106 mmol/L (ref 101–111)
Creatinine, Ser: 1.15 mg/dL (ref 0.61–1.24)
GFR calc Af Amer: >60 mL/min (ref >60)
GFR calc non Af Amer: >60 mL/min (ref >60)
Glucose, Bld: 110 mg/dL — ABNORMAL HIGH (ref 65–99)
Potassium: 3.7 mmol/L (ref 3.5–5.1)
Sodium: 139 mmol/L (ref 135–145)
Total Bilirubin: 0.5 mg/dL (ref 0.3–1.2)
Total Protein: 6.2 g/dL — ABNORMAL LOW (ref 6.5–8.1)

## 2015-08-26 ENCOUNTER — Other Ambulatory Visit: Payer: Self-pay | Admitting: Pain Medicine

## 2015-08-26 ENCOUNTER — Encounter: Payer: Self-pay | Admitting: Pain Medicine

## 2015-08-26 ENCOUNTER — Ambulatory Visit: Payer: Medicare Other | Attending: Pain Medicine | Admitting: Pain Medicine

## 2015-08-26 VITALS — BP 110/63 | HR 108 | Temp 99.9°F | Resp 16 | Ht 67.0 in | Wt 163.0 lb

## 2015-08-26 DIAGNOSIS — N401 Enlarged prostate with lower urinary tract symptoms: Secondary | ICD-10-CM | POA: Diagnosis not present

## 2015-08-26 DIAGNOSIS — M549 Dorsalgia, unspecified: Secondary | ICD-10-CM | POA: Insufficient documentation

## 2015-08-26 DIAGNOSIS — M4726 Other spondylosis with radiculopathy, lumbar region: Secondary | ICD-10-CM

## 2015-08-26 DIAGNOSIS — Z7189 Other specified counseling: Secondary | ICD-10-CM

## 2015-08-26 DIAGNOSIS — I480 Paroxysmal atrial fibrillation: Secondary | ICD-10-CM | POA: Diagnosis not present

## 2015-08-26 DIAGNOSIS — G894 Chronic pain syndrome: Secondary | ICD-10-CM

## 2015-08-26 DIAGNOSIS — F1120 Opioid dependence, uncomplicated: Secondary | ICD-10-CM

## 2015-08-26 DIAGNOSIS — M5416 Radiculopathy, lumbar region: Secondary | ICD-10-CM

## 2015-08-26 DIAGNOSIS — M7918 Myalgia, other site: Secondary | ICD-10-CM

## 2015-08-26 DIAGNOSIS — Z9689 Presence of other specified functional implants: Secondary | ICD-10-CM | POA: Diagnosis not present

## 2015-08-26 DIAGNOSIS — Z79891 Long term (current) use of opiate analgesic: Secondary | ICD-10-CM | POA: Insufficient documentation

## 2015-08-26 DIAGNOSIS — I10 Essential (primary) hypertension: Secondary | ICD-10-CM | POA: Insufficient documentation

## 2015-08-26 DIAGNOSIS — M792 Neuralgia and neuritis, unspecified: Secondary | ICD-10-CM

## 2015-08-26 DIAGNOSIS — Z7901 Long term (current) use of anticoagulants: Secondary | ICD-10-CM

## 2015-08-26 DIAGNOSIS — S22080A Wedge compression fracture of T11-T12 vertebra, initial encounter for closed fracture: Secondary | ICD-10-CM

## 2015-08-26 DIAGNOSIS — S22080S Wedge compression fracture of T11-T12 vertebra, sequela: Secondary | ICD-10-CM

## 2015-08-26 DIAGNOSIS — G893 Neoplasm related pain (acute) (chronic): Secondary | ICD-10-CM | POA: Insufficient documentation

## 2015-08-26 DIAGNOSIS — Z95828 Presence of other vascular implants and grafts: Secondary | ICD-10-CM

## 2015-08-26 DIAGNOSIS — Z462 Encounter for fitting and adjustment of other devices related to nervous system and special senses: Secondary | ICD-10-CM

## 2015-08-26 DIAGNOSIS — Z79899 Other long term (current) drug therapy: Secondary | ICD-10-CM

## 2015-08-26 DIAGNOSIS — E7801 Familial hypercholesterolemia: Secondary | ICD-10-CM | POA: Insufficient documentation

## 2015-08-26 DIAGNOSIS — M545 Low back pain, unspecified: Secondary | ICD-10-CM

## 2015-08-26 DIAGNOSIS — M6249 Contracture of muscle, multiple sites: Secondary | ICD-10-CM

## 2015-08-26 DIAGNOSIS — M4854XS Collapsed vertebra, not elsewhere classified, thoracic region, sequela of fracture: Secondary | ICD-10-CM

## 2015-08-26 DIAGNOSIS — M797 Fibromyalgia: Secondary | ICD-10-CM

## 2015-08-26 DIAGNOSIS — M62838 Other muscle spasm: Secondary | ICD-10-CM

## 2015-08-26 DIAGNOSIS — G8929 Other chronic pain: Secondary | ICD-10-CM | POA: Diagnosis not present

## 2015-08-26 DIAGNOSIS — F1190 Opioid use, unspecified, uncomplicated: Secondary | ICD-10-CM

## 2015-08-26 DIAGNOSIS — Z451 Encounter for adjustment and management of infusion pump: Secondary | ICD-10-CM

## 2015-08-26 DIAGNOSIS — Z5181 Encounter for therapeutic drug level monitoring: Secondary | ICD-10-CM | POA: Insufficient documentation

## 2015-08-26 DIAGNOSIS — M47816 Spondylosis without myelopathy or radiculopathy, lumbar region: Secondary | ICD-10-CM | POA: Insufficient documentation

## 2015-08-26 DIAGNOSIS — Z0289 Encounter for other administrative examinations: Secondary | ICD-10-CM

## 2015-08-26 DIAGNOSIS — Z978 Presence of other specified devices: Secondary | ICD-10-CM | POA: Insufficient documentation

## 2015-08-26 LAB — KAPPA/LAMBDA LIGHT CHAINS
Kappa free light chain: 474.33 mg/L — ABNORMAL HIGH (ref 3.30–19.40)
Kappa, lambda light chain ratio: 70.58 — ABNORMAL HIGH (ref 0.26–1.65)
Lambda free light chains: 6.72 mg/L (ref 5.71–26.30)

## 2015-08-26 MED ORDER — BACLOFEN 10 MG PO TABS: 10.0000 mg | ORAL_TABLET | Freq: Every day | ORAL | Status: DC

## 2015-08-26 NOTE — Progress Notes (Signed)
Patient here today for pain pump refill.  Patient is s/p steroid dosage on Sunday.  Has been feeling well up until today and now is feeling bad.    Baclofen  10 mg qty count #18

## 2015-08-26 NOTE — Progress Notes (Signed)
Patient's Name: Logan Perez MRN: [id number] DOB: 09-01-1946 DOS: 08/26/2015  Primary Reason(s) for Visit: Intrathecal Pump Refill. CC: Back Pain   Pre-Procedure Assessment: Logan Perez is a 69 y.o. year old, male patient, seen today for interventional treatment. He has BPH with obstruction/lower urinary tract symptoms; Multiple myeloma (Logan Perez); Diarrhea; Clostridium difficile diarrhea; Essential (primary) hypertension; Acid reflux; Combined fat and carbohydrate induced hyperlipemia; MI (mitral incompetence); TI (tricuspid incompetence); Paroxysmal atrial fibrillation (Logan Perez); Pulmonary embolism (Logan Perez); Breathlessness on exertion; Chronic pain; Presence of intrathecal pump; Long term current use of opiate analgesic; Long term prescription opiate use; Opiate use; Opiate dependence (Logan Perez); Encounter for therapeutic drug level monitoring; Encounter for chronic pain management; Encounter for adjustment or management of infusion pump; Night muscle spasms; Muscle spasticity; Neuropathic pain; and Neurogenic pain on his problem list.. His primarily concern today is the Back Pain Verification of the correct person, correct site (including marking of site), and correct procedure were performed and confirmed by the patient. Today's Vitals   08/26/15 1356 08/26/15 1400  BP: 110/63   Pulse: 108   Temp: 99.9 F (37.7 C)   TempSrc: Oral   Resp: 16   Height: '5\' 7"'  (1.702 m)   Weight: 163 lb (73.936 kg)   SpO2: 100%   PainSc: 3  3   Calculated BMI: Body mass index is 25.52 kg/(m^2). Allergies: He is allergic to zometa and rivaroxaban.. Primary Diagnosis: Chronic pain [G89.29]  Today's Pain Score: 3  Reported level of pain is compatible with clinical observation Pain Type: Chronic pain Pain Location: Back Pain Orientation: Lower Pain Descriptors / Indicators: Aching, Constant Pain Frequency: Constant  Pharmacotherapy Review:   Side-effects or Adverse reactions: None reported. Effectiveness:  Described as relatively effective, allowing for increase in activities of daily living (ADL). Onset of action: Within expected pharmacological parameters. Duration of action: Within normal limits for medication. Peak effect: Timing and results are as within normal expected parameters.  PMP: Compliant with practice rules and regulations. DST: Compliant with practice rules and regulations. Lab work: No new labs ordered by our practice. Treatment compliance: Compliant. Substance Use Disorder (SUD) Risk Level: Low Planned course of action: Continue therapy as is.  Intrathecal Pump Therapy: Side-effects or Adverse reactions: None reported. Effectiveness: Described as relatively effective, allowing for increase in activities of daily living (ADL). Plan: No changes in programming.  Procedure: Type: Palliative Intrathecal Pump Refill by Nursing Staff Region: Lower Abdominal Area Laterality: Right-Sided  Indications: Chronic Pain  Consent: Secured. Under the influence of no sedatives a written informed consent was obtained, after having provided information on the risks and possible complications. To fulfill our ethical and legal obligations, as recommended by the American Medical Association's Code of Ethics, we have provided information to the patient about our clinical impression; the nature and purpose of the treatment or procedure; the risks, benefits, and possible complications of the intervention; alternatives; the risk(s) and benefit(s) of the alternative treatment(s) or procedure(s); and the risk(s) and benefit(s) of doing nothing. The patient was provided information about the risks and possible complications associated with the procedure. These include, but are not limited to, failure to achieve desired goals, infection, bleeding, organ or nerve damage, allergic reactions, paralysis, and death. In addition, the patient was informed that Medicine is not an exact science; therefore, there  is also the possibility of unforeseen risks and possible complications that may result in a catastrophic outcome. The patient indicated having understood very clearly. We have given the patient no guarantees and we have  made no promises. Enough time was given to the patient to ask questions, all of which were answered to the patient's satisfaction.  Pre-Procedure Preparation: Safety Precautions: Allergies reviewed. Appropriate site, procedure, and patient were confirmed by following the Joint Commission's Universal Protocol (UP.01.01.01), in the form of a "Time Out". The patient was asked about blood thinners, or active infections, both of which were denied. Monitoring:  Clinical observation Infection Control Precautions: Sterile technique used. Standard Universal Precautions were taken as recommended by the Department of [facility] for Disease Control and Prevention (CDC). Standard pre-surgical skin prep was conducted. Respiratory hygiene and cough etiquette was practiced. Hand hygiene observed. Safe injection practices and needle disposal techniques followed. Medications properly checked for expiration dates and contaminants. Personal protective equipment (PPE) used: Sterile surgical gloves.  Anesthesia, Analgesia, Anxiolysis: Type: None required Local Anesthetic(s): None used. IV Access: None. Sedation: Not necessary or indicated. Indication(s): None.  Description of Procedure Process:  Time-out: "Time-out" completed before starting procedure, as per protocol. Position: Supine Target Area: For Intrathecal Pump Refill(s), the target is the central port. Approach: Anterior, 90 degree angle approach. Area Prepped: Entire Area around the pump implant. Prepping solution: ChloraPrep (2% chlorhexidine gluconate and 70% isopropyl alcohol) Safety Precautions: Aspiration looking for blood return was conducted prior to all injections. At no point did we inject any substances, as a needle was  being advanced. No attempts were made at seeking any paresthesias. Safe injection practices and needle disposal techniques used. Medications properly checked for expiration dates. SDV (single dose vial) medications used. Description of the Procedure: Protocol guidelines were followed. Two nurses trained to do implant refills were present during the entire procedure. The refill medication was checked by both healthcare providers as well as the patient. The patient was included in the "Time-out" to verify the medication. The patient was placed in position. The pump was identified. The area was prepped in the usual manner. The sterile template was positioned over the pump, making sure the side-port location matched that of the pump. Both, the pump and the template were held for stability. The needle provided in the Medtronic Kit was then introduced thru the center of the template and into the central port. The pump content was aspirated and discarded volume documented. The new medication was slowly infused into the pump, thru the filter, making sure to avoid overpressure of the device. The needle was then removed and the area cleansed, making sure to leave some of the prepping solution back to take advantage of its long term bactericidal properties. The pump was interrogated and programmed to reflect the correct medication, volume, and dosage. The program was printed and taken to the physician for approval. Once checked and signed by the physician, a copy was provided to the patient and another scanned into the EMR. EBL: None Materials Used:  Medtronic Refill Kit  Post-procedure Assessment:  Complications: Uneventful. Disposition: Return to clinic in 3 to4 months for refill. (See printout) Comments:  No additional relevant information.  Assessment: Encounter Diagnosis:  Primary Diagnosis: Chronic pain [G89.29] Plan: Logan Perez was seen today for back pain.  Diagnoses and all orders for this  visit:  Chronic pain -     PUMP REFILL -     PUMP REFILL; Future  Presence of intrathecal pump  Long term current use of opiate analgesic  Long term prescription opiate use  Opiate use  Uncomplicated opioid dependence ([place])  Encounter for therapeutic drug level monitoring  Encounter for chronic pain management  Encounter  for adjustment or management of infusion pump  Night muscle spasms -     baclofen (LIORESAL) 10 MG tablet; Take 1 tablet (10 mg total) by mouth at bedtime.  Muscle spasticity -     baclofen (LIORESAL) 10 MG tablet; Take 1 tablet (10 mg total) by mouth at bedtime.  Neuropathic pain  Neurogenic pain   Patient instructions:  Patient Instructions  Narcotic Overdose A narcotic overdose is the misuse or overuse of a narcotic drug. A narcotic overdose can make you pass out and stop breathing. If you are not treated right away, this can cause permanent brain damage or stop your heart. Medicine may be given to reverse the effects of an overdose. If so, this medicine may bring on withdrawal symptoms. The symptoms may be abdominal cramps, throwing up (vomiting), sweating, chills, and nervousness. Injecting narcotics can cause more problems than just an overdose. AIDS, hepatitis, and other very serious infections are transmitted by sharing needles and syringes. If you decide to quit using, there are medicines which can help you through the withdrawal period. Trying to quit all at once on your own can be uncomfortable, but not life-threatening. Call your caregiver, Narcotics Anonymous, or any drug and alcohol treatment program for further help.    This information is not intended to replace advice given to you by your health care provider. Make sure you discuss any questions you have with your health care provider.   Document Released: 11/02/2004 Document Revised: 10/16/2014 Document Reviewed: 03/17/2015 Elsevier Interactive Patient Education 2016 [place].    Primary Care Physician: Sherrin Daisy, MD Location: [facility] Attending Physician: Gaspar Cola, MD

## 2015-08-26 NOTE — Patient Instructions (Signed)
Narcotic Overdose °A narcotic overdose is the misuse or overuse of a narcotic drug. A narcotic overdose can make you pass out and stop breathing. If you are not treated right away, this can cause permanent brain damage or stop your heart. Medicine may be given to reverse the effects of an overdose. If so, this medicine may bring on withdrawal symptoms. The symptoms may be abdominal cramps, throwing up (vomiting), sweating, chills, and nervousness. °Injecting narcotics can cause more problems than just an overdose. AIDS, hepatitis, and other very serious infections are transmitted by sharing needles and syringes. If you decide to quit using, there are medicines which can help you through the withdrawal period. Trying to quit all at once on your own can be uncomfortable, but not life-threatening. Call your caregiver, Narcotics Anonymous, or any drug and alcohol treatment program for further help.  °  °This information is not intended to replace advice given to you by your health care provider. Make sure you discuss any questions you have with your health care provider. °  °Document Released: 11/02/2004 Document Revised: 10/16/2014 Document Reviewed: 03/17/2015 °Elsevier Interactive Patient Education ©2016 Elsevier Inc. ° °

## 2015-08-27 ENCOUNTER — Telehealth: Payer: Self-pay

## 2015-08-27 NOTE — Telephone Encounter (Signed)
Post procedure phone call.  Wife states he is doing good and feels better today.

## 2015-08-30 ENCOUNTER — Ambulatory Visit: Payer: Medicare Other

## 2015-08-30 ENCOUNTER — Telehealth: Payer: Self-pay | Admitting: *Deleted

## 2015-08-30 ENCOUNTER — Other Ambulatory Visit: Payer: Self-pay | Admitting: *Deleted

## 2015-08-30 DIAGNOSIS — A0472 Enterocolitis due to Clostridium difficile, not specified as recurrent: Secondary | ICD-10-CM

## 2015-08-30 MED FILL — Medication: Qty: 1 | Status: AC

## 2015-08-30 NOTE — Telephone Encounter (Signed)
Appt given for lab today and he agrees to come for it  For stool culture and C Diff test

## 2015-08-30 NOTE — Telephone Encounter (Signed)
Had C diff and had tx and he got better, but he is again having diarrhea which is worsening over the past few days and is asking if he needs more med

## 2015-08-31 ENCOUNTER — Other Ambulatory Visit: Payer: Self-pay | Admitting: *Deleted

## 2015-08-31 DIAGNOSIS — A0472 Enterocolitis due to Clostridium difficile, not specified as recurrent: Secondary | ICD-10-CM

## 2015-08-31 DIAGNOSIS — C9002 Multiple myeloma in relapse: Secondary | ICD-10-CM | POA: Diagnosis not present

## 2015-08-31 LAB — C DIFFICILE QUICK SCREEN W PCR REFLEX
C Diff antigen: POSITIVE — AB
C Diff toxin: NEGATIVE

## 2015-08-31 LAB — CLOSTRIDIUM DIFFICILE BY PCR: Toxigenic C. Difficile by PCR: POSITIVE — AB

## 2015-09-01 ENCOUNTER — Other Ambulatory Visit: Payer: Medicare Other

## 2015-09-01 ENCOUNTER — Inpatient Hospital Stay (HOSPITAL_BASED_OUTPATIENT_CLINIC_OR_DEPARTMENT_OTHER): Payer: Medicare Other | Admitting: Hematology and Oncology

## 2015-09-01 VITALS — BP 129/70 | HR 82 | Temp 96.8°F | Wt 160.1 lb

## 2015-09-01 DIAGNOSIS — Z9221 Personal history of antineoplastic chemotherapy: Secondary | ICD-10-CM

## 2015-09-01 DIAGNOSIS — Z8673 Personal history of transient ischemic attack (TIA), and cerebral infarction without residual deficits: Secondary | ICD-10-CM

## 2015-09-01 DIAGNOSIS — R197 Diarrhea, unspecified: Secondary | ICD-10-CM

## 2015-09-01 DIAGNOSIS — Z9481 Bone marrow transplant status: Secondary | ICD-10-CM | POA: Diagnosis not present

## 2015-09-01 DIAGNOSIS — C9002 Multiple myeloma in relapse: Secondary | ICD-10-CM | POA: Diagnosis not present

## 2015-09-01 DIAGNOSIS — I10 Essential (primary) hypertension: Secondary | ICD-10-CM

## 2015-09-01 DIAGNOSIS — Z87311 Personal history of (healed) other pathological fracture: Secondary | ICD-10-CM

## 2015-09-01 DIAGNOSIS — E785 Hyperlipidemia, unspecified: Secondary | ICD-10-CM

## 2015-09-01 DIAGNOSIS — T50995S Adverse effect of other drugs, medicaments and biological substances, sequela: Secondary | ICD-10-CM

## 2015-09-01 DIAGNOSIS — R634 Abnormal weight loss: Secondary | ICD-10-CM

## 2015-09-01 DIAGNOSIS — F17210 Nicotine dependence, cigarettes, uncomplicated: Secondary | ICD-10-CM

## 2015-09-01 DIAGNOSIS — Z79899 Other long term (current) drug therapy: Secondary | ICD-10-CM

## 2015-09-01 DIAGNOSIS — Z7901 Long term (current) use of anticoagulants: Secondary | ICD-10-CM

## 2015-09-01 DIAGNOSIS — Z86711 Personal history of pulmonary embolism: Secondary | ICD-10-CM

## 2015-09-01 NOTE — Progress Notes (Signed)
[facility] day:  09/01/2015  Chief Complaint: Logan Perez is a 69 y.o. 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  HPI: The patient was last seen in the medical oncology clinic on 08/04/2015.  At that time, he was day 6 of his next cycle of Revlimid and Decadron.  Symptomatically, he was back to baseline.  His C difficile + diarrhea had resolved (completed Flagyl on 07/30/2015).  Kappa free light chains were increasing.  We discussed a switch to Pomalyst and Decadron.  During the interim, he had recurrence of his diarrhea.  However, it appears to be subsiding.  Sunday he had 5 bowel movements.  Monday he had 1 bowel movement.  Tuesday he had 2 bowel movements.  He notes some bulk to his stool today.  He notes that he is lactose intolerant.  He began Pomalyst on Friday (08/27/2015).  He has had 5 days.  He denies any issues.  When discussing his prior transplant, he notes that he lost a significant amount of weight (160 pounds down to 130 pounds).  He could not eat.  Past Medical History  Diagnosis Date  . Pulmonary embolism ([place])   . Stroke ([place])   . GERD (gastroesophageal reflux disease)   . Hypertension   . Atrial fibrillation ([place])   . HLD (hyperlipidemia)   . Multiple myeloma ([place])   . Elevated PSA   . BPH (benign prostatic hyperplasia)   . Difficulty voiding     Past Surgical History  Procedure Laterality Date  . Back surgery  1994  . Knee arthroscopy Left 1992  . Pain pump implantation  2012  . Stem cell implant  2008    [facility]    Family History  Problem Relation Age of Onset  . Cancer Father     throat  . Bladder Cancer Neg Hx   . Kidney disease Sister   . Prostate cancer Neg Hx   . Stroke      Social History:  reports that he has quit smoking. His smoking use  included Cigarettes. He has a 87.5 pack-year smoking history. He does not have any smokeless tobacco history on file. He reports that he does not drink alcohol or use illicit drugs.  The patient is accompanied by his wife, Logan Perez, today.  Allergies:  Allergies  Allergen Reactions  . Zometa [Zoledronic Acid] Other (See Comments)    Osteonecrosis of the jaw  . Rivaroxaban Rash    Current Medications: Current Outpatient Prescriptions  Medication Sig Dispense Refill  . ALPRAZolam (XANAX) 0.25 MG tablet Take 1 tablet (0.25 mg total) by mouth at bedtime as needed for anxiety. 30 tablet 3  . atorvastatin (LIPITOR) 10 MG tablet Take by mouth.    . baclofen (LIORESAL) 10 MG tablet Take 1 tablet (10 mg total) by mouth at bedtime. 30 each 3  . calcium citrate-vitamin D (CITRACAL+D) 315-200 MG-UNIT per tablet Take by mouth.    . cetirizine (ZYRTEC) 10 MG tablet Take by mouth.    . Cholecalciferol (VITAMIN D3) 2000 UNITS capsule Take by mouth.    . Cyanocobalamin (RA VITAMIN B-12 TR) 1000 MCG TBCR Take by mouth.    . dexamethasone (DECADRON) 4 MG tablet TAKE 5 TABLETS BY MOUTH 1 TIME A WEEK 21 tablet 0  . diltiazem (CARDIZEM CD) 120 MG 24  hr capsule TAKE 1 CAPSULE ONCE DAILY    . diltiazem (CARDIZEM) 60 MG tablet Take 60 mg by mouth.     Arne Cleveland 5 MG TABS tablet TK 1 T PO TWICE DAILY  3  . ibuprofen (ADVIL,MOTRIN) 200 MG tablet Take by mouth.    . loperamide (IMODIUM) 2 MG capsule Take by mouth.    . naproxen sodium (RA NAPROXEN SODIUM) 220 MG tablet Take by mouth.    [name] omeprazole (PRILOSEC) 20 MG capsule Take by mouth.    [name] PAIN MANAGEMENT IT PUMP REFILL 1 each by Intrathecal route once. Medication: PF Fentanyl 1,500.0 mcg/ml PF Bupivicaine 30.0 mg/ml PF Clonidine 300.22mg/ml Total Volume: 40 ml Needed by 08-26-15 @ 1340 1 each 0  . pomalidomide (POMALYST) 4 MG capsule Take 1 capsule (4 mg total) by mouth daily. Take with water on days 1-21. Repeat every 28 days. 21 capsule 0  . potassium  chloride SA (K-DUR,KLOR-CON) 20 MEQ tablet Take 1 tablet (20 mEq total) by mouth 2 (two) times daily. 180 tablet 0  . ranitidine (ZANTAC) 150 MG tablet Take by mouth.    . tamsulosin (FLOMAX) 0.4 MG CAPS capsule Take 1 capsule (0.4 mg total) by mouth daily. 90 capsule 3   No current facility-administered medications for this visit.    Review of Systems:  GENERAL:  Feels "ok".  No fevers or sweats .  Weight down 3 pounds. PERFORMANCE STATUS (ECOG): 1 HEENT:  No visual changes, runny nose, sore throat, mouth sores or tenderness. Lungs: No shortness of breath or cough.  No hemoptysis. Cardiac:  No chest pain, palpitations, orthopnea, or PND. GI:  Diarrhea appears to be subsiding.  No nausea, vomiting, diarrhea, constipation, melena or hematochezia. GU:  No urgency, frequency, dysuria, or hematuria. Musculoskeletal:  Compression fracture of back for which he remains on a pain pump (chronic).  No joint pain.  No muscle tenderness. Extremities:  No pain or swelling. Skin:  Fragile skin.  No rashes or skin changes. Neuro:  No headache, numbness or weakness, balance or coordination issues. Endocrine:  No diabetes, thyroid issues, hot flashes or night sweats. Psych:  No mood changes, depression or anxiety. Pain:  No focal pain. Review of systems:  All other systems reviewed and found to be negative.   Physical Exam: Blood pressure 129/70, pulse 82, temperature 96.8 F (36 C), temperature source Oral, weight 160 pounds (72.6 kg). GENERAL:  Well developed, well nourished, sitting comfortably in the exam room in no acute distress. MENTAL STATUS:  Alert and oriented to person, place and time. HEAD:  GPearline Cablesshort hair.  Goatee.  Normocephalic, atraumatic, face symmetric, no Cushingoid features. EYES:  Glasses.  Blue eyes.  Pupils equal round and reactive to light and accomodation.  No conjunctivitis or scleral icterus. ENT:  Oropharynx clear without lesion.  Hearing aide.  Tongue normal. Mucous  membranes moist.  RESPIRATORY:  Clear to auscultation without rales, wheezes or rhonchi. CARDIOVASCULAR:  Regular rate and rhythm without murmur, rub or gallop. ABDOMEN:  RUQ pain pump.  Soft, non-tender, with active bowel sounds, and no hepatosplenomegaly.  No masses. SKIN:  No rashes, ulcers or lesions. EXTREMITIES: No edema, no skin discoloration or tenderness.  No palpable cords. LYMPH NODES: No palpable cervical, supraclavicular, axillary or inguinal adenopathy  NEUROLOGICAL: Unremarkable. PSYCH:  Appropriate.   Orders Only on 08/31/2015  Component Date Value Ref Range Status  . Specimen Description 08/31/2015 STOOL   Final  . Special Requests 08/31/2015 culture   Final  .  Culture 08/31/2015    Final                   Value:HOLDING FOR POSSIBLE PATHOGEN NO CAMPYLOBACTER DETECTED   . Report Status 08/31/2015 PENDING   Incomplete  . C Diff antigen 08/31/2015 POSITIVE* NEGATIVE Final  . C Diff toxin 08/31/2015 NEGATIVE  NEGATIVE Final  . C Diff interpretation 08/31/2015    Corrected                   Value:Positive for toxigenic C. difficile, active toxin production not detected. Patient has toxigenic C. difficile organisms present in the bowel, but toxin was not detected. The patient may be a carrier or the level of toxin in the sample was below the limit  of detection. This information should be used in conjunction with the patient's clinical history when deciding on possible therapy.    CORRECTED ON 11/22 AT 1326: PREVIOUSLY REPORTED AS REFLEX TO PCR  . Toxigenic C Difficile by pcr 08/31/2015 POSITIVE* NEGATIVE Final   Comment: CRITICAL RESULT CALLED TO, READ BACK BY AND VERIFIED WITH: DR. Mickel Duhamel AT 9604 08/31/15 DV     Assessment:  Logan Perez is a 69 y.o. male with stage III mutiple myeloma.  He initially presented with progressive back pain beginning in 12/2006.  MRI revealed "spots and compression fractures".  He began Velcade, thalidomide, and Decadron.  In  08/2007, he underwent high dose chemotherapy and autologous stem cell transplant.  He recurred with a rising M-spike (2.7) with repeat M spike (1.7 gm/dl) in 03/2010.  He was initially treated with Velcade (02/08/2010 - 05/10/2010).  He then began Revlimid (15 mg 3 weeks on/1 week off) and Decadron (40 mg on day 1, 8, 15, 22).  Because of significant side effect with Decadron his dose was decreased to 10 mg once a week in 07/2010.    He was on maintenance Revlimid.  Revlimid was initially10 mg 3 weeks on/1 week off.  This was changed to 10 mg 2 weeks on/2 weeks off secondary to right nipple tenderness.  His dose was increased to 10 mg 3 weeks on/1 week off with Decadron 10 mg a week (on Sundays) and then Revlamid 15 mg 3 weeks on and 1 week off with Decadron on Sundays.  He began Pomalyst 37m 3 weeks on/1 week off with Decadron on 08/27/2015.  Over the past year his SPEP has revealed no monoclonal protein (last 04/21/2015).  Free light chains have been monitored.  Kappa free light chains were 18.54 on 11/21/2013, 18.37 on 02/20/2014, 18.93 on 05/22/2014, 32.58 (high; normal ratio 1.27) on 08/21/2014, 51.53 (high; elevated ratio of 2.12) on 11/13/2014, 28.08 (ratio 1.73) on 12/09/2014, 23.71 (ratio 2.17) on 01/18/2015, 92.93 (ratio 9.49) on 04/21/2015, 93.44 (ratio 10.28) on 05/26/2015, 255.45 (ratio of 24.05) on 07/14/2015, 373.89 (ratio 48.31) on 08/04/2015, and 474.33 (ratio 70.58) on 08/25/2015.  Bone survey on 12/08/2014 was stable.  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  He develoed herpes zoster in 04/2008.  He developed a pulmonary embolism in 05/2013.  He was initially on Xarelto, but is now on Eliquis.  He had an episode of pneumonia around this time requiring a brief admission.  He developed severe lower leg cramps on 08/07/2014 secondary to hypokalemia. Duplex was negative.   He was treated for C difficile colitis (Flagyl completed  07/30/2015).  Diarrhea recurred, but appears to be resolving.  He has lost 3 pounds.  Exam is stable.  Plan:  1. Review labs from 08/25/2015. 2. Stool for C diff toxin and culture. 3. Patient to call clinic if worsening diarrhea. 4. RTC on 09/15/2015 for labs (CBC with diff). 5. RTC on 09/22/2015 for MD assess, labs (CBC with diff, CMP, SPEP, free light chains) prior to cycle #2 Pomalyst and Decadron.   Lequita Asal, MD  09/01/2015, 10:01 AM

## 2015-09-02 LAB — STOOL CULTURE

## 2015-09-03 LAB — TOXASSURE SELECT 13 (MW), URINE: PDF: 0

## 2015-09-08 ENCOUNTER — Other Ambulatory Visit: Payer: Self-pay | Admitting: Hematology and Oncology

## 2015-09-15 ENCOUNTER — Inpatient Hospital Stay: Payer: Medicare Other | Attending: Hematology and Oncology

## 2015-09-15 ENCOUNTER — Other Ambulatory Visit: Payer: Self-pay

## 2015-09-15 DIAGNOSIS — C9002 Multiple myeloma in relapse: Secondary | ICD-10-CM | POA: Diagnosis present

## 2015-09-15 DIAGNOSIS — Z9221 Personal history of antineoplastic chemotherapy: Secondary | ICD-10-CM | POA: Diagnosis not present

## 2015-09-15 DIAGNOSIS — Z87311 Personal history of (healed) other pathological fracture: Secondary | ICD-10-CM | POA: Diagnosis not present

## 2015-09-15 DIAGNOSIS — Z86711 Personal history of pulmonary embolism: Secondary | ICD-10-CM | POA: Diagnosis not present

## 2015-09-15 DIAGNOSIS — Z79899 Other long term (current) drug therapy: Secondary | ICD-10-CM | POA: Diagnosis not present

## 2015-09-15 DIAGNOSIS — Z7901 Long term (current) use of anticoagulants: Secondary | ICD-10-CM | POA: Insufficient documentation

## 2015-09-15 DIAGNOSIS — T50995S Adverse effect of other drugs, medicaments and biological substances, sequela: Secondary | ICD-10-CM | POA: Diagnosis not present

## 2015-09-15 DIAGNOSIS — Z8673 Personal history of transient ischemic attack (TIA), and cerebral infarction without residual deficits: Secondary | ICD-10-CM | POA: Diagnosis not present

## 2015-09-15 DIAGNOSIS — F17210 Nicotine dependence, cigarettes, uncomplicated: Secondary | ICD-10-CM | POA: Insufficient documentation

## 2015-09-15 DIAGNOSIS — K219 Gastro-esophageal reflux disease without esophagitis: Secondary | ICD-10-CM | POA: Diagnosis not present

## 2015-09-15 DIAGNOSIS — E785 Hyperlipidemia, unspecified: Secondary | ICD-10-CM | POA: Diagnosis not present

## 2015-09-15 DIAGNOSIS — I10 Essential (primary) hypertension: Secondary | ICD-10-CM | POA: Insufficient documentation

## 2015-09-15 DIAGNOSIS — I4891 Unspecified atrial fibrillation: Secondary | ICD-10-CM | POA: Insufficient documentation

## 2015-09-15 DIAGNOSIS — Z9481 Bone marrow transplant status: Secondary | ICD-10-CM | POA: Diagnosis not present

## 2015-09-15 DIAGNOSIS — M87180 Osteonecrosis due to drugs, jaw: Secondary | ICD-10-CM | POA: Insufficient documentation

## 2015-09-15 DIAGNOSIS — N400 Enlarged prostate without lower urinary tract symptoms: Secondary | ICD-10-CM | POA: Diagnosis not present

## 2015-09-15 LAB — CBC WITH DIFFERENTIAL/PLATELET
Basophils Absolute: 0 10*3/uL (ref 0–0.1)
Basophils Relative: 0 %
Eosinophils Absolute: 0.1 10*3/uL (ref 0–0.7)
Eosinophils Relative: 4 %
HCT: 28.9 % — ABNORMAL LOW (ref 40.0–52.0)
Hemoglobin: 9.6 g/dL — ABNORMAL LOW (ref 13.0–18.0)
Lymphocytes Relative: 20 %
Lymphs Abs: 0.6 10*3/uL — ABNORMAL LOW (ref 1.0–3.6)
MCH: 34.1 pg — ABNORMAL HIGH (ref 26.0–34.0)
MCHC: 33.1 g/dL (ref 32.0–36.0)
MCV: 103.1 fL — ABNORMAL HIGH (ref 80.0–100.0)
Monocytes Absolute: 0.9 10*3/uL (ref 0.2–1.0)
Monocytes Relative: 27 %
Neutro Abs: 1.6 10*3/uL (ref 1.4–6.5)
Neutrophils Relative %: 49 %
Platelets: 119 10*3/uL — ABNORMAL LOW (ref 150–440)
RBC: 2.81 MIL/uL — ABNORMAL LOW (ref 4.40–5.90)
RDW: 18.6 % — ABNORMAL HIGH (ref 11.5–14.5)
WBC: 3.3 10*3/uL — ABNORMAL LOW (ref 3.8–10.6)

## 2015-09-17 ENCOUNTER — Other Ambulatory Visit: Payer: Self-pay | Admitting: Pain Medicine

## 2015-09-22 ENCOUNTER — Ambulatory Visit (HOSPITAL_BASED_OUTPATIENT_CLINIC_OR_DEPARTMENT_OTHER): Payer: Medicare Other | Admitting: Hematology and Oncology

## 2015-09-22 ENCOUNTER — Encounter: Payer: Self-pay | Admitting: Hematology and Oncology

## 2015-09-22 ENCOUNTER — Inpatient Hospital Stay: Payer: Medicare Other

## 2015-09-22 VITALS — BP 131/74 | HR 77 | Temp 97.6°F | Resp 18 | Ht 67.0 in | Wt 164.0 lb

## 2015-09-22 DIAGNOSIS — T50995S Adverse effect of other drugs, medicaments and biological substances, sequela: Secondary | ICD-10-CM | POA: Diagnosis not present

## 2015-09-22 DIAGNOSIS — I10 Essential (primary) hypertension: Secondary | ICD-10-CM

## 2015-09-22 DIAGNOSIS — Z9221 Personal history of antineoplastic chemotherapy: Secondary | ICD-10-CM

## 2015-09-22 DIAGNOSIS — C9002 Multiple myeloma in relapse: Secondary | ICD-10-CM

## 2015-09-22 DIAGNOSIS — Z86711 Personal history of pulmonary embolism: Secondary | ICD-10-CM

## 2015-09-22 DIAGNOSIS — M87180 Osteonecrosis due to drugs, jaw: Secondary | ICD-10-CM

## 2015-09-22 DIAGNOSIS — Z7901 Long term (current) use of anticoagulants: Secondary | ICD-10-CM

## 2015-09-22 DIAGNOSIS — N400 Enlarged prostate without lower urinary tract symptoms: Secondary | ICD-10-CM

## 2015-09-22 DIAGNOSIS — I4891 Unspecified atrial fibrillation: Secondary | ICD-10-CM

## 2015-09-22 DIAGNOSIS — Z79899 Other long term (current) drug therapy: Secondary | ICD-10-CM

## 2015-09-22 DIAGNOSIS — E785 Hyperlipidemia, unspecified: Secondary | ICD-10-CM

## 2015-09-22 DIAGNOSIS — F17210 Nicotine dependence, cigarettes, uncomplicated: Secondary | ICD-10-CM

## 2015-09-22 DIAGNOSIS — K219 Gastro-esophageal reflux disease without esophagitis: Secondary | ICD-10-CM

## 2015-09-22 LAB — COMPREHENSIVE METABOLIC PANEL
ALT: 20 U/L (ref 17–63)
AST: 19 U/L (ref 15–41)
Albumin: 3.4 g/dL — ABNORMAL LOW (ref 3.5–5.0)
Alkaline Phosphatase: 57 U/L (ref 38–126)
Anion gap: 11 (ref 5–15)
BUN: 29 mg/dL — ABNORMAL HIGH (ref 6–20)
CO2: 22 mmol/L (ref 22–32)
Calcium: 9 mg/dL (ref 8.9–10.3)
Chloride: 105 mmol/L (ref 101–111)
Creatinine, Ser: 1.2 mg/dL (ref 0.61–1.24)
GFR calc Af Amer: >60 mL/min (ref >60)
GFR calc non Af Amer: 60 mL/min — ABNORMAL LOW (ref >60)
Glucose, Bld: 93 mg/dL (ref 65–99)
Potassium: 3.4 mmol/L — ABNORMAL LOW (ref 3.5–5.1)
Sodium: 138 mmol/L (ref 135–145)
Total Bilirubin: 0.8 mg/dL (ref 0.3–1.2)
Total Protein: 6.5 g/dL (ref 6.5–8.1)

## 2015-09-22 LAB — CBC WITH DIFFERENTIAL/PLATELET
Basophils Absolute: 0 10*3/uL (ref 0–0.1)
Basophils Relative: 0 %
Eosinophils Absolute: 0 10*3/uL (ref 0–0.7)
Eosinophils Relative: 0 %
HCT: 30.1 % — ABNORMAL LOW (ref 40.0–52.0)
Hemoglobin: 10 g/dL — ABNORMAL LOW (ref 13.0–18.0)
Lymphocytes Relative: 16 %
Lymphs Abs: 0.8 10*3/uL — ABNORMAL LOW (ref 1.0–3.6)
MCH: 34.6 pg — ABNORMAL HIGH (ref 26.0–34.0)
MCHC: 33.3 g/dL (ref 32.0–36.0)
MCV: 103.9 fL — ABNORMAL HIGH (ref 80.0–100.0)
Monocytes Absolute: 1.3 10*3/uL — ABNORMAL HIGH (ref 0.2–1.0)
Monocytes Relative: 26 %
Neutro Abs: 2.9 10*3/uL (ref 1.4–6.5)
Neutrophils Relative %: 58 %
Platelets: 186 10*3/uL (ref 150–440)
RBC: 2.9 MIL/uL — ABNORMAL LOW (ref 4.40–5.90)
RDW: 18.7 % — ABNORMAL HIGH (ref 11.5–14.5)
WBC: 5 10*3/uL (ref 3.8–10.6)

## 2015-09-22 NOTE — Progress Notes (Signed)
[facility] day:  09/22/2015  Chief Complaint: Bryce Cheever is a 69 y.o. 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  HPI: The patient was last seen in the medical oncology clinic on 09/01/2015.  At that time, he was day 6 of cycle #1 Pomalyst and Decadron.  He was doing well except for dome diarrhea, which appeared to be resolving.  Stool studies were + for C diff antigen, but negative for C diff toxin.  Stool was negative for salmonella, shigella, campylobacter, or pathogenic E coli.    During the interim, he states that his diarrhea has resolved.  His weight is up.  He is eating better.  He is taking probiotics.  He is back to his normal pattern of 2 formed stools a day.  He is tolerating his Pomalyst ("so far so good").  He states that his next cycle starts on Friday, 09/24/2015.  He takes his Decadron on Sundays.  Past Medical History  Diagnosis Date  . Pulmonary embolism ([place])   . Stroke ([place])   . GERD (gastroesophageal reflux disease)   . Hypertension   . Atrial fibrillation ([place])   . HLD (hyperlipidemia)   . Multiple myeloma ([place])   . Elevated PSA   . BPH (benign prostatic hyperplasia)   . Difficulty voiding     Past Surgical History  Procedure Laterality Date  . Back surgery  1994  . Knee arthroscopy Left 1992  . Pain pump implantation  2012  . Stem cell implant  2008    [facility]    Family History  Problem Relation Age of Onset  . Cancer Father     throat  . Bladder Cancer Neg Hx   . Kidney disease Sister   . Prostate cancer Neg Hx   . Stroke      Social History:  reports that he has quit smoking. His smoking use included Cigarettes. He has a 30 pack-year smoking history. He does not have any smokeless tobacco history on file. He reports that he does not drink alcohol or use illicit drugs.  The patient is alone  today.  Allergies:  Allergies  Allergen Reactions  . Zometa [Zoledronic Acid] Other (See Comments)    Osteonecrosis of the jaw  . Rivaroxaban Rash    Current Medications: Current Outpatient Prescriptions  Medication Sig Dispense Refill  . ALPRAZolam (XANAX) 0.25 MG tablet Take 1 tablet (0.25 mg total) by mouth at bedtime as needed for anxiety. 30 tablet 3  . atorvastatin (LIPITOR) 10 MG tablet Take by mouth.    . baclofen (LIORESAL) 10 MG tablet Take 1 tablet (10 mg total) by mouth at bedtime. 30 each 3  . calcium citrate-vitamin D (CITRACAL+D) 315-200 MG-UNIT per tablet Take by mouth.    . cetirizine (ZYRTEC) 10 MG tablet Take by mouth.    . Cholecalciferol (VITAMIN D3) 2000 UNITS capsule Take by mouth.    . Cyanocobalamin (RA VITAMIN B-12 TR) 1000 MCG TBCR Take by mouth.    . dexamethasone (DECADRON) 4 MG tablet TAKE 5 TABLETS BY MOUTH 1 TIME A WEEK 21 tablet 0  . diltiazem (CARDIZEM CD) 120 MG 24 hr capsule TAKE 1 CAPSULE ONCE DAILY    . diltiazem (CARDIZEM) 60 MG tablet Take 60 mg by mouth.     Arne Cleveland 5 MG TABS tablet TK 1 T PO TWICE DAILY  3  . ibuprofen (ADVIL,MOTRIN) 200 MG tablet Take by mouth.    . loperamide (IMODIUM) 2 MG capsule Take by mouth.    . naproxen sodium (RA NAPROXEN SODIUM) 220 MG tablet Take by mouth.    [name] omeprazole (PRILOSEC) 20 MG capsule Take by mouth.    [name] PAIN MANAGEMENT IT PUMP REFILL 1 each by Intrathecal route once. Medication: PF Fentanyl 1,500.0 mcg/ml PF Bupivicaine 30.0 mg/ml PF Clonidine 300.58mg/ml Total Volume: 40 ml Needed by 08-26-15 @ 1340 1 each 0  . pomalidomide (POMALYST) 4 MG capsule Take 1 capsule (4 mg total) by mouth daily. Take with water on days 1-21. Repeat every 28 days. 21 capsule 0  . potassium chloride SA (K-DUR,KLOR-CON) 20 MEQ tablet Take 1 tablet (20 mEq total) by mouth 2 (two) times daily. 180 tablet 0  . Probiotic Product (PAvon CAPS Take 1 capsule by mouth daily.    . ranitidine (ZANTAC) 150 MG  tablet Take by mouth.    . tamsulosin (FLOMAX) 0.4 MG CAPS capsule Take 1 capsule (0.4 mg total) by mouth daily. 90 capsule 3   No current facility-administered medications for this visit.    Review of Systems:  GENERAL:  Feels good.  No fevers or sweats .  Weight up 4 pounds. PERFORMANCE STATUS (ECOG): 1 HEENT:  No visual changes, runny nose, sore throat, mouth sores or tenderness. Lungs: No shortness of breath or cough.  No hemoptysis. Cardiac:  No chest pain, palpitations, orthopnea, or PND. GI:  No nausea, vomiting, diarrhea, constipation, melena or hematochezia. GU:  No urgency, frequency, dysuria, or hematuria. Musculoskeletal:  Compression fracture of back for which he remains on a pain pump (chronic).  No joint pain.  No muscle tenderness. Extremities:  No pain or swelling. Skin:  Fragile skin.  No rashes or skin changes. Neuro:  No headache, numbness or weakness, balance or coordination issues. Endocrine:  No diabetes, thyroid issues, hot flashes or night sweats. Psych:  No mood changes, depression or anxiety. Pain:  No focal pain. Review of systems:  All other systems reviewed and found to be negative.   Physical Exam: Blood pressure 131/74, pulse 77, temperature 97.6 F (36.4 C), temperature source Tympanic, resp. rate 18, height _0  (1.702 m), weight 164 lb 0.4 oz (74.4 kg). GENERAL:  Well developed, well nourished, sitting comfortably in the exam room in no acute distress. MENTAL STATUS:  Alert and oriented to person, place and time. HEAD:  GPearline Cablesshort hair with goatee.  Normocephalic, atraumatic, face symmetric, no Cushingoid features. EYES:  Glasses.  Blue eyes.  Pupils equal round and reactive to light and accomodation.  No conjunctivitis or scleral icterus. ENT:  Oropharynx clear without lesion.  Hearing aide.  Tongue normal. Mucous membranes moist.  RESPIRATORY:  Clear to auscultation without rales, wheezes or rhonchi. CARDIOVASCULAR:  Regular rate and rhythm  without murmur, rub or gallop. ABDOMEN:  RUQ pain pump.  Soft, non-tender, with active bowel sounds, and no hepatosplenomegaly.  No masses. SKIN:  No rashes, ulcers or lesions. EXTREMITIES: No edema, no skin discoloration or tenderness.  No palpable cords. LYMPH NODES: No palpable cervical, supraclavicular, axillary or inguinal adenopathy  NEUROLOGICAL: Unremarkable. PSYCH:  Appropriate.   Appointment on 09/22/2015  Component Date Value Ref Range Status  . WBC 09/22/2015 5.0  3.8 - 10.6 K/uL Final  . RBC 09/22/2015 2.90* 4.40 - 5.90 MIL/uL Final  . Hemoglobin 09/22/2015 10.0* 13.0 - 18.0 g/dL Final  . HCT 09/22/2015 30.1* 40.0 -  52.0 % Final  . MCV 09/22/2015 103.9* 80.0 - 100.0 fL Final  . MCH 09/22/2015 34.6* 26.0 - 34.0 pg Final  . MCHC 09/22/2015 33.3  32.0 - 36.0 g/dL Final  . RDW 09/22/2015 18.7* 11.5 - 14.5 % Final  . Platelets 09/22/2015 186  150 - 440 K/uL Final  . Neutrophils Relative % 09/22/2015 58   Final  . Neutro Abs 09/22/2015 2.9  1.4 - 6.5 K/uL Final  . Lymphocytes Relative 09/22/2015 16   Final  . Lymphs Abs 09/22/2015 0.8* 1.0 - 3.6 K/uL Final  . Monocytes Relative 09/22/2015 26   Final  . Monocytes Absolute 09/22/2015 1.3* 0.2 - 1.0 K/uL Final  . Eosinophils Relative 09/22/2015 0   Final  . Eosinophils Absolute 09/22/2015 0.0  0 - 0.7 K/uL Final  . Basophils Relative 09/22/2015 0   Final  . Basophils Absolute 09/22/2015 0.0  0 - 0.1 K/uL Final  . Sodium 09/22/2015 138  135 - 145 mmol/L Final  . Potassium 09/22/2015 3.4* 3.5 - 5.1 mmol/L Final  . Chloride 09/22/2015 105  101 - 111 mmol/L Final  . CO2 09/22/2015 22  22 - 32 mmol/L Final  . Glucose, Bld 09/22/2015 93  65 - 99 mg/dL Final  . BUN 09/22/2015 29* 6 - 20 mg/dL Final  . Creatinine, Ser 09/22/2015 1.20  0.61 - 1.24 mg/dL Final  . Calcium 09/22/2015 9.0  8.9 - 10.3 mg/dL Final  . Total Protein 09/22/2015 6.5  6.5 - 8.1 g/dL Final  . Albumin 09/22/2015 3.4* 3.5 - 5.0 g/dL Final  . AST 09/22/2015 19  15  - 41 U/L Final  . ALT 09/22/2015 20  17 - 63 U/L Final  . Alkaline Phosphatase 09/22/2015 57  38 - 126 U/L Final  . Total Bilirubin 09/22/2015 0.8  0.3 - 1.2 mg/dL Final  . GFR calc non Af Amer 09/22/2015 60* >60 mL/min Final  . GFR calc Af Amer 09/22/2015 >60  >60 mL/min Final   Comment: (NOTE) The eGFR has been calculated using the CKD EPI equation. This calculation has not been validated in all clinical situations. eGFR's persistently <60 mL/min signify possible Chronic Kidney Disease.   . Anion gap 09/22/2015 11  5 - 15 Final    Assessment:  Logan Perez is a 69 y.o. male with stage III mutiple myeloma.  He initially presented with progressive back pain beginning in 12/2006.  MRI revealed "spots and compression fractures".  He began Velcade, thalidomide, and Decadron.  In 08/2007, he underwent high dose chemotherapy and autologous stem cell transplant.  He recurred with a rising M-spike (2.7) with repeat M spike (1.7 gm/dl) in 03/2010.  He was initially treated with Velcade (02/08/2010 - 05/10/2010).  He then began Revlimid (15 mg 3 weeks on/1 week off) and Decadron (40 mg on day 1, 8, 15, 22).  Because of significant side effect with Decadron his dose was decreased to 10 mg once a week in 07/2010.    He was on maintenance Revlimid.  Revlimid was initially10 mg 3 weeks on/1 week off.  This was changed to 10 mg 2 weeks on/2 weeks off secondary to right nipple tenderness.  His dose was increased to 10 mg 3 weeks on/1 week off with Decadron 10 mg a week (on Sundays) and then Revlamid 15 mg 3 weeks on and 1 week off with Decadron on Sundays.  He began Pomalyst 4 mg 3 weeks on/1 week off with Decadron on 08/27/2015.  Over the past  year his SPEP has revealed no monoclonal protein (04/21/2015) and 0.5 gm/dL on 09/22/2015.  Free light chains have been monitored.  Kappa free light chains were 18.54 on 11/21/2013, 18.37 on 02/20/2014, 18.93 on 05/22/2014, 32.58 (high; normal ratio 1.27) on  08/21/2014, 51.53 (high; elevated ratio of 2.12) on 11/13/2014, 28.08 (ratio 1.73) on 12/09/2014, 23.71 (ratio 2.17) on 01/18/2015, 92.93 (ratio 9.49) on 04/21/2015, 93.44 (ratio 10.28) on 05/26/2015, 255.45 (ratio of 24.05) on 07/14/2015, 373.89 (ratio 48.31) on 08/04/2015, 474.33 (ratio 70.58) on 08/25/2015, 450.76 (ratio 55.44).  Bone survey on 12/08/2014 was stable.  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  He develoed herpes zoster in 04/2008.  He developed a pulmonary embolism in 05/2013.  He was initially on Xarelto, but is now on Eliquis.  He had an episode of pneumonia around this time requiring a brief admission.  He developed severe lower leg cramps on 08/07/2014 secondary to hypokalemia.  Duplex was negative.   He was treated for C difficile colitis (Flagyl completed 07/30/2015).   He is currently day 27 of cycle #1 Pomalyst and Decadron (began 08/27/2015).  Symptomatically, he feels good.  He has a chronic indwelling pain pump.    Plan:  1. Labs today:  CBC with diff, CMP, SPEP, free light chains. 2. Continue Pomalyst and Decadron (day 1 of cycle #2 begins 09/24/2015). 3. RTC in 1 month for MD assessment, labs (CBC with diff, CMP, SPEP, free light chains).   Lequita Asal, MD  09/22/2015, 10:06 AM

## 2015-09-23 LAB — KAPPA/LAMBDA LIGHT CHAINS
Kappa free light chain: 450.76 mg/L — ABNORMAL HIGH (ref 3.30–19.40)
Kappa, lambda light chain ratio: 55.44 — ABNORMAL HIGH (ref 0.26–1.65)
Lambda free light chains: 8.13 mg/L (ref 5.71–26.30)

## 2015-09-23 LAB — PROTEIN ELECTROPHORESIS, SERUM
A/G Ratio: 1.3 (ref 0.7–1.7)
Albumin ELP: 3.2 g/dL (ref 2.9–4.4)
Alpha-1-Globulin: 0.2 g/dL (ref 0.0–0.4)
Alpha-2-Globulin: 0.7 g/dL (ref 0.4–1.0)
Beta Globulin: 1.5 g/dL — ABNORMAL HIGH (ref 0.7–1.3)
Gamma Globulin: 0.2 g/dL — ABNORMAL LOW (ref 0.4–1.8)
Globulin, Total: 2.5 g/dL (ref 2.2–3.9)
M-Spike, %: 0.5 g/dL — ABNORMAL HIGH
Total Protein ELP: 5.7 g/dL — ABNORMAL LOW (ref 6.0–8.5)

## 2015-09-24 ENCOUNTER — Other Ambulatory Visit: Payer: Self-pay

## 2015-09-26 ENCOUNTER — Other Ambulatory Visit: Payer: Self-pay | Admitting: Hematology and Oncology

## 2015-10-04 ENCOUNTER — Other Ambulatory Visit: Payer: Self-pay | Admitting: Hematology and Oncology

## 2015-10-20 ENCOUNTER — Other Ambulatory Visit: Payer: Self-pay | Admitting: *Deleted

## 2015-10-20 ENCOUNTER — Inpatient Hospital Stay: Payer: Medicare Other | Attending: Hematology and Oncology

## 2015-10-20 ENCOUNTER — Inpatient Hospital Stay (HOSPITAL_BASED_OUTPATIENT_CLINIC_OR_DEPARTMENT_OTHER): Payer: Medicare Other | Admitting: Hematology and Oncology

## 2015-10-20 ENCOUNTER — Other Ambulatory Visit: Payer: Self-pay | Admitting: Hematology and Oncology

## 2015-10-20 VITALS — BP 128/71 | HR 70 | Temp 96.5°F | Resp 18 | Ht 67.0 in | Wt 166.7 lb

## 2015-10-20 DIAGNOSIS — H269 Unspecified cataract: Secondary | ICD-10-CM | POA: Insufficient documentation

## 2015-10-20 DIAGNOSIS — Z9221 Personal history of antineoplastic chemotherapy: Secondary | ICD-10-CM | POA: Diagnosis not present

## 2015-10-20 DIAGNOSIS — I4891 Unspecified atrial fibrillation: Secondary | ICD-10-CM | POA: Diagnosis not present

## 2015-10-20 DIAGNOSIS — M87180 Osteonecrosis due to drugs, jaw: Secondary | ICD-10-CM

## 2015-10-20 DIAGNOSIS — R197 Diarrhea, unspecified: Secondary | ICD-10-CM | POA: Insufficient documentation

## 2015-10-20 DIAGNOSIS — R251 Tremor, unspecified: Secondary | ICD-10-CM

## 2015-10-20 DIAGNOSIS — Z86711 Personal history of pulmonary embolism: Secondary | ICD-10-CM | POA: Diagnosis not present

## 2015-10-20 DIAGNOSIS — G252 Other specified forms of tremor: Secondary | ICD-10-CM | POA: Diagnosis not present

## 2015-10-20 DIAGNOSIS — Z9484 Stem cells transplant status: Secondary | ICD-10-CM

## 2015-10-20 DIAGNOSIS — C9002 Multiple myeloma in relapse: Secondary | ICD-10-CM | POA: Insufficient documentation

## 2015-10-20 DIAGNOSIS — T50995S Adverse effect of other drugs, medicaments and biological substances, sequela: Secondary | ICD-10-CM | POA: Diagnosis not present

## 2015-10-20 DIAGNOSIS — G620 Drug-induced polyneuropathy: Secondary | ICD-10-CM

## 2015-10-20 DIAGNOSIS — Z79899 Other long term (current) drug therapy: Secondary | ICD-10-CM

## 2015-10-20 DIAGNOSIS — T451X5S Adverse effect of antineoplastic and immunosuppressive drugs, sequela: Secondary | ICD-10-CM | POA: Insufficient documentation

## 2015-10-20 DIAGNOSIS — Z8673 Personal history of transient ischemic attack (TIA), and cerebral infarction without residual deficits: Secondary | ICD-10-CM | POA: Diagnosis not present

## 2015-10-20 DIAGNOSIS — K219 Gastro-esophageal reflux disease without esophagitis: Secondary | ICD-10-CM | POA: Diagnosis not present

## 2015-10-20 DIAGNOSIS — F17210 Nicotine dependence, cigarettes, uncomplicated: Secondary | ICD-10-CM | POA: Diagnosis not present

## 2015-10-20 DIAGNOSIS — N400 Enlarged prostate without lower urinary tract symptoms: Secondary | ICD-10-CM | POA: Diagnosis not present

## 2015-10-20 DIAGNOSIS — M545 Low back pain: Secondary | ICD-10-CM | POA: Insufficient documentation

## 2015-10-20 DIAGNOSIS — I10 Essential (primary) hypertension: Secondary | ICD-10-CM | POA: Diagnosis not present

## 2015-10-20 DIAGNOSIS — Z7901 Long term (current) use of anticoagulants: Secondary | ICD-10-CM | POA: Insufficient documentation

## 2015-10-20 DIAGNOSIS — E785 Hyperlipidemia, unspecified: Secondary | ICD-10-CM | POA: Diagnosis not present

## 2015-10-20 LAB — COMPREHENSIVE METABOLIC PANEL
ALT: 18 U/L (ref 17–63)
AST: 12 U/L — ABNORMAL LOW (ref 15–41)
Albumin: 3.5 g/dL (ref 3.5–5.0)
Alkaline Phosphatase: 53 U/L (ref 38–126)
Anion gap: 8 (ref 5–15)
BUN: 26 mg/dL — ABNORMAL HIGH (ref 6–20)
CO2: 25 mmol/L (ref 22–32)
Calcium: 9 mg/dL (ref 8.9–10.3)
Chloride: 104 mmol/L (ref 101–111)
Creatinine, Ser: 1.08 mg/dL (ref 0.61–1.24)
GFR calc Af Amer: >60 mL/min (ref >60)
GFR calc non Af Amer: >60 mL/min (ref >60)
Glucose, Bld: 114 mg/dL — ABNORMAL HIGH (ref 65–99)
Potassium: 3.6 mmol/L (ref 3.5–5.1)
Sodium: 137 mmol/L (ref 135–145)
Total Bilirubin: 0.9 mg/dL (ref 0.3–1.2)
Total Protein: 6.4 g/dL — ABNORMAL LOW (ref 6.5–8.1)

## 2015-10-20 LAB — CBC WITH DIFFERENTIAL/PLATELET
Basophils Absolute: 0 10*3/uL (ref 0–0.1)
Basophils Relative: 0 %
Eosinophils Absolute: 0 10*3/uL (ref 0–0.7)
Eosinophils Relative: 0 %
HCT: 32.2 % — ABNORMAL LOW (ref 40.0–52.0)
Hemoglobin: 10.6 g/dL — ABNORMAL LOW (ref 13.0–18.0)
Lymphocytes Relative: 16 %
Lymphs Abs: 0.8 10*3/uL — ABNORMAL LOW (ref 1.0–3.6)
MCH: 34.4 pg — ABNORMAL HIGH (ref 26.0–34.0)
MCHC: 33 g/dL (ref 32.0–36.0)
MCV: 104.2 fL — ABNORMAL HIGH (ref 80.0–100.0)
Monocytes Absolute: 1.4 10*3/uL — ABNORMAL HIGH (ref 0.2–1.0)
Monocytes Relative: 28 %
Neutro Abs: 2.9 10*3/uL (ref 1.4–6.5)
Neutrophils Relative %: 56 %
Platelets: 177 10*3/uL (ref 150–440)
RBC: 3.09 MIL/uL — ABNORMAL LOW (ref 4.40–5.90)
RDW: 18.4 % — ABNORMAL HIGH (ref 11.5–14.5)
WBC: 5.2 10*3/uL (ref 3.8–10.6)

## 2015-10-20 NOTE — Telephone Encounter (Signed)
Obtained [employer] Auth. # and faxed with new Rx to [facility]. I called patient and informed him that this was done.

## 2015-10-20 NOTE — Progress Notes (Signed)
[facility] day:  10/20/2015  Chief Complaint: Logan Perez is a 70 y.o. 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  At that time, he was day 27 of cycle #1 Pomalyst.  He was tolerating it well.  He denied any diarrhea.  He was eating well and gaining weight.  Labs were notable for a hematocrit of 30.1, hemoglobin 10.0, MCV 103.9, platelets 186,000, WBC 5000 with an ANC of 2900.  Creatinine was 1.20 (stable).  Calcium was 9.0, protein 6.5, and albumen 3.4.  SPEP revealed 0.5 gm/dL monoclonal spike.  Kappa free light chains were 450.76 (ratio 55.44).  He began cycle #2 Pamalyst on 09/24/2015.  Symptomatically, he feels good.  He notes that he is on his typical downslope from steroids.  He has had "a little diarrhea" (nothing consistent).  He notes some low back pain.  He comments that he has the beginnings of cataracts ("feels like looking through tears").  He notes a "little tremor" which is getting worse (he has had a tremor since his 30s or 40s).  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  When discussing the Velcade he received in the past, he notes that his M-spike was slowly climbing on therapy.  Past Medical History  Diagnosis Date  . Pulmonary embolism ([place])   . Stroke ([place])   . GERD (gastroesophageal reflux disease)   . Hypertension   . Atrial fibrillation ([place])   . HLD (hyperlipidemia)   . Multiple myeloma ([place])   . Elevated PSA   . BPH (benign prostatic hyperplasia)   . Difficulty voiding     Past Surgical History  Procedure Laterality Date  . Back surgery  1994  . Knee arthroscopy Left 1992  . Pain pump implantation  2012  . Stem cell implant  2008    [facility]    Family History   Problem Relation Age of Onset  . Cancer Father     throat  . Bladder Cancer Neg Hx   . Kidney disease Sister   . Prostate cancer Neg Hx   . Stroke      Social History:  reports that he has quit smoking. His smoking use included Cigarettes. He has a 30 pack-year smoking history. He does not have any smokeless tobacco history on file. He reports that he does not drink alcohol or use illicit drugs.  The patient is accompanied by his wife, [name], today.  Allergies:  Allergies  Allergen Reactions  . Zometa [Zoledronic Acid] Other (See Comments)    Osteonecrosis of the jaw  . Rivaroxaban Rash    Current Medications: Current Outpatient Prescriptions  Medication Sig Dispense Refill  . ALPRAZolam (XANAX) 0.25 MG tablet Take 1 tablet (0.25 mg total) by mouth at bedtime as needed for anxiety. 30 tablet 3  . atorvastatin (LIPITOR) 10 MG tablet Take by mouth.    . baclofen (LIORESAL) 10 MG tablet Take 1 tablet (10 mg total) by mouth at bedtime. 30 each 3  . calcium citrate-vitamin D (CITRACAL+D) 315-200 MG-UNIT per tablet Take by mouth.    . cetirizine (ZYRTEC) 10 MG tablet Take by mouth.    . Cholecalciferol (VITAMIN D3) 2000 UNITS capsule Take by mouth.    [name]  Cyanocobalamin (RA VITAMIN B-12 TR) 1000 MCG TBCR Take by mouth.    . dexamethasone (DECADRON) 4 MG tablet TAKE 5 TABLETS BY MOUTH 1 TIME A WEEK 21 tablet 0  . diltiazem (CARDIZEM CD) 120 MG 24 hr capsule TAKE 1 CAPSULE ONCE DAILY    . diltiazem (CARDIZEM) 60 MG tablet Take 60 mg by mouth.     Arne Cleveland 5 MG TABS tablet TK 1 T PO TWICE DAILY  3  . ibuprofen (ADVIL,MOTRIN) 200 MG tablet Take by mouth.    . loperamide (IMODIUM) 2 MG capsule Take by mouth.    . naproxen sodium (RA NAPROXEN SODIUM) 220 MG tablet Take by mouth.    [name] omeprazole (PRILOSEC) 20 MG capsule Take by mouth.    [name] PAIN MANAGEMENT IT PUMP REFILL 1 each by Intrathecal route once. Medication: PF Fentanyl 1,500.0 mcg/ml PF Bupivicaine 30.0 mg/ml PF Clonidine  300.65mg/ml Total Volume: 40 ml Needed by 08-26-15 @ 1340 1 each 0  . pomalidomide (POMALYST) 4 MG capsule Take 1 capsule (4 mg total) by mouth daily. Take with water on days 1-21. Repeat every 28 days. 21 capsule 0  . potassium chloride SA (K-DUR,KLOR-CON) 20 MEQ tablet TAKE 1 TABLET(20 MEQ) BY MOUTH TWICE DAILY 180 tablet 0  . Probiotic Product ([place]rumright CAPS Take 1 capsule by mouth daily.    . ranitidine (ZANTAC) 150 MG tablet Take by mouth.    . tamsulosin (FLOMAX) 0.4 MG CAPS capsule Take 1 capsule (0.4 mg total) by mouth daily. 90 capsule 3   No current facility-administered medications for this visit.    Review of Systems:  GENERAL:  Feels good.  No fevers or sweats .  Weight up 2 pounds. PERFORMANCE STATUS (ECOG): 1 HEENT:  Cataracts.  Feels like looking through tears (cataracts).  Saw opthalmologist 6-7 months ago.   No runny nose, sore throat, mouth sores or tenderness. Lungs: No shortness of breath or cough.  No hemoptysis. Cardiac:  No chest pain, palpitations, orthopnea, or PND. GI:  Little diarrhea (not consistent).  No nausea, vomiting, constipation, melena or hematochezia. GU:  No urgency, frequency, dysuria, or hematuria. Musculoskeletal:  Compression fracture of back for which he remains on a pain pump (chronic).  No joint pain.  No muscle tenderness. Extremities:  No pain or swelling. Skin:  Fragile skin.  No rashes or skin changes. Neuro:  Little tremor, getting worse over the years.  Neuropathy in feet (see HPI).  No headache, weakness, balance or coordination issues. Endocrine:  No diabetes, thyroid issues, hot flashes or night sweats. Psych:  No mood changes, depression or anxiety. Pain:  No focal pain. Review of systems:  All other systems reviewed and found to be negative.   Physical Exam: Blood pressure 128/71, pulse 70, temperature 96.5 F (35.8 C), temperature source Tympanic, resp. rate 18, height _0  (1.702 m), weight 166 lb 10.7 oz (75.6  kg). GENERAL:  Well developed, well nourished, sitting comfortably in the exam room in no acute distress. MENTAL STATUS:  Alert and oriented to person, place and time. HEAD:  GPearline Cablesshort hair with goatee.  Normocephalic, atraumatic, face symmetric, no Cushingoid features. EYES:  Glasses.  Blue eyes.  Pupils equal round and reactive to light and accomodation.  No conjunctivitis or scleral icterus. ENT:  Oropharynx clear without lesion.  Hearing aide.  Tongue normal. Mucous membranes moist.  RESPIRATORY:  Clear to auscultation without rales, wheezes or rhonchi. CARDIOVASCULAR:  Regular rate and rhythm without murmur, rub or  gallop. ABDOMEN:  RUQ pain pump.  Soft, non-tender, with active bowel sounds, and no hepatosplenomegaly.  No masses. SKIN:  No rashes, ulcers or lesions. EXTREMITIES: No edema, no skin discoloration or tenderness.  No palpable cords. LYMPH NODES: No palpable cervical, supraclavicular, axillary or inguinal adenopathy  NEUROLOGICAL: Intention tremor. PSYCH:  Appropriate.   Appointment on 10/20/2015  Component Date Value Ref Range Status  . WBC 10/20/2015 5.2  3.8 - 10.6 K/uL Final  . RBC 10/20/2015 3.09* 4.40 - 5.90 MIL/uL Final  . Hemoglobin 10/20/2015 10.6* 13.0 - 18.0 g/dL Final  . HCT 10/20/2015 32.2* 40.0 - 52.0 % Final  . MCV 10/20/2015 104.2* 80.0 - 100.0 fL Final  . MCH 10/20/2015 34.4* 26.0 - 34.0 pg Final  . MCHC 10/20/2015 33.0  32.0 - 36.0 g/dL Final  . RDW 10/20/2015 18.4* 11.5 - 14.5 % Final  . Platelets 10/20/2015 177  150 - 440 K/uL Final  . Neutrophils Relative % 10/20/2015 56   Final  . Neutro Abs 10/20/2015 2.9  1.4 - 6.5 K/uL Final  . Lymphocytes Relative 10/20/2015 16   Final  . Lymphs Abs 10/20/2015 0.8* 1.0 - 3.6 K/uL Final  . Monocytes Relative 10/20/2015 28   Final  . Monocytes Absolute 10/20/2015 1.4* 0.2 - 1.0 K/uL Final  . Eosinophils Relative 10/20/2015 0   Final  . Eosinophils Absolute 10/20/2015 0.0  0 - 0.7 K/uL Final  . Basophils  Relative 10/20/2015 0   Final  . Basophils Absolute 10/20/2015 0.0  0 - 0.1 K/uL Final  . Sodium 10/20/2015 137  135 - 145 mmol/L Final  . Potassium 10/20/2015 3.6  3.5 - 5.1 mmol/L Final  . Chloride 10/20/2015 104  101 - 111 mmol/L Final  . CO2 10/20/2015 25  22 - 32 mmol/L Final  . Glucose, Bld 10/20/2015 114* 65 - 99 mg/dL Final  . BUN 10/20/2015 26* 6 - 20 mg/dL Final  . Creatinine, Ser 10/20/2015 1.08  0.61 - 1.24 mg/dL Final  . Calcium 10/20/2015 9.0  8.9 - 10.3 mg/dL Final  . Total Protein 10/20/2015 6.4* 6.5 - 8.1 g/dL Final  . Albumin 10/20/2015 3.5  3.5 - 5.0 g/dL Final  . AST 10/20/2015 12* 15 - 41 U/L Final  . ALT 10/20/2015 18  17 - 63 U/L Final  . Alkaline Phosphatase 10/20/2015 53  38 - 126 U/L Final  . Total Bilirubin 10/20/2015 0.9  0.3 - 1.2 mg/dL Final  . GFR calc non Af Amer 10/20/2015 >60  >60 mL/min Final  . GFR calc Af Amer 10/20/2015 >60  >60 mL/min Final   Comment: (NOTE) The eGFR has been calculated using the CKD EPI equation. This calculation has not been validated in all clinical situations. eGFR's persistently <60 mL/min signify possible Chronic Kidney Disease.   . Anion gap 10/20/2015 8  5 - 15 Final    Assessment:  Logan Perez is a 70 y.o. male with stage III mutiple myeloma.  He initially presented with progressive back pain beginning in 12/2006.  MRI revealed "spots and compression fractures".  He began Velcade, thalidomide, and Decadron.  In 08/2007, he underwent high dose chemotherapy and autologous stem cell transplant.  He recurred with a rising M-spike (2.7) with repeat M spike (1.7 gm/dl) in 03/2010.  He was initially treated with Velcade (02/08/2010 - 05/10/2010).  He then began Revlimid (15 mg 3 weeks on/1 week off) and Decadron (40 mg on day 1, 8, 15, 22).  Because of significant side effect  with Decadron his dose was decreased to 10 mg once a week in 07/2010.    He was on maintenance Revlimid. Revlimid was initially10 mg 3 weeks  on/1 week off. This was changed to 10 mg 2 weeks on/2 weeks off secondary to right nipple tenderness. His dose was increased to 10 mg 3 weeks on/1 week off with Decadron 10 mg a week (on Sundays) and then Revlamid 15 mg 3 weeks on and 1 week off with Decadron on Sundays.  He began Pomalyst 4 mg 3 weeks on/1 week off with Decadron on 08/27/2015.  Over the past year his SPEP has revealed no monoclonal protein (04/21/2015) and 0.5 gm/dL on 09/22/2015 and 10/20/2015. Free light chains have been monitored. Kappa free light chains were 18.54 on 11/21/2013, 18.37 on 02/20/2014, 18.93 on 05/22/2014, 32.58 (high; normal ratio 1.27) on 08/21/2014, 51.53 (high; elevated ratio of 2.12) on 11/13/2014, 28.08 (ratio 1.73) on 12/09/2014, 23.71 (ratio 2.17) on 01/18/2015, 92.93 (ratio 9.49) on 04/21/2015, 93.44 (ratio 10.28) on 05/26/2015, 255.45 (ratio of 24.05) on 07/14/2015, 373.89 (ratio 48.31) on 08/04/2015, 474.33 (ratio 70.58) on 08/25/2015, 450.76 (ratio 55.44) on 09/22/2015, and 453.4 (ratio 59.89) on 10/20/2015. Bone survey on 12/08/2014 was stable.  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 He develoed herpes zoster in 04/2008. He developed a pulmonary embolism in 05/2013. He was initially on Xarelto, but is now on Eliquis. He had an episode of pneumonia around this time requiring a brief admission. He developed severe lower leg cramps on 08/07/2014 secondary to hypokalemia. Duplex was negative.   He was treated for C difficile colitis (Flagyl completed 07/30/2015).  He has a chronic indwelling pain pump.  He is currently day 27 of cycle #2 Pomalyst and Decadron (08/27/2015 -09/24/2015).   Symptomatically, he is doing well.  He has intermittent small volume diarrhea.  He has cataracts and an intention tremor.  Plan:  1.  Labs today:  CBC with diff, CMP, free light chains. 2.  Discuss kappa light chains and emergence of M-spike on Pomalyst.   Discuss restaging bone marrow and bone survey.  Discuss lack of benefit with Velcade (progression noted by patient in distant past).  Discuss Kyprolis (carfilzomib).  Discuss referral to [facility] for planning and treatment options. 3.  Schedule neurology consult with Dr. Gurney Maxin re: tremor. 4.  Schedule bone marrow biopsy 10/25/2015. 5.  Schedule bone survey 10/22/2015. 6.  RTC on 11/03/2015 for MD assessment and review of biopsy.   Lequita Asal, MD  10/20/2015, 9:52 AM

## 2015-10-21 ENCOUNTER — Ambulatory Visit
Admission: RE | Admit: 2015-10-21 | Discharge: 2015-10-21 | Disposition: A | Discharge status: Home / Self Care | Payer: Medicare Other | Source: Ambulatory Visit | Attending: Hematology and Oncology | Admitting: Hematology and Oncology

## 2015-10-21 DIAGNOSIS — C9002 Multiple myeloma in relapse: Secondary | ICD-10-CM | POA: Diagnosis not present

## 2015-10-21 LAB — PROTEIN ELECTROPHORESIS, SERUM
A/G Ratio: 1.2 (ref 0.7–1.7)
Albumin ELP: 3.1 g/dL (ref 2.9–4.4)
Alpha-1-Globulin: 0.1 g/dL (ref 0.0–0.4)
Alpha-2-Globulin: 0.8 g/dL (ref 0.4–1.0)
Beta Globulin: 1.5 g/dL — ABNORMAL HIGH (ref 0.7–1.3)
Gamma Globulin: 0.2 g/dL — ABNORMAL LOW (ref 0.4–1.8)
Globulin, Total: 2.6 g/dL (ref 2.2–3.9)
M-Spike, %: 0.5 g/dL — ABNORMAL HIGH
Total Protein ELP: 5.7 g/dL — ABNORMAL LOW (ref 6.0–8.5)

## 2015-10-21 LAB — KAPPA/LAMBDA LIGHT CHAINS
Kappa free light chain: 453.4 mg/L — ABNORMAL HIGH (ref 3.30–19.40)
Kappa, lambda light chain ratio: 59.89 — ABNORMAL HIGH (ref 0.26–1.65)
Lambda free light chains: 7.57 mg/L (ref 5.71–26.30)

## 2015-10-26 ENCOUNTER — Other Ambulatory Visit: Payer: Self-pay | Admitting: Radiology

## 2015-10-27 ENCOUNTER — Ambulatory Visit
Admission: RE | Admit: 2015-10-27 | Discharge: 2015-10-27 | Disposition: A | Discharge status: Home / Self Care | Payer: Medicare Other | Source: Ambulatory Visit | Attending: Hematology and Oncology | Admitting: Hematology and Oncology

## 2015-10-27 ENCOUNTER — Telehealth: Payer: Self-pay | Admitting: *Deleted

## 2015-10-27 MED ORDER — SODIUM CHLORIDE 0.9 % IV SOLN: INTRAVENOUS | Status: DC

## 2015-10-27 NOTE — Telephone Encounter (Signed)
Unable to get bone marrow done this morning due to fever and chills Temp 102. He is coughing also. Asking if you want to start him on abx. BM will need to be rescheduled

## 2015-10-27 NOTE — Telephone Encounter (Signed)
Notified of need to see PCP and have CBCD checked, she repeated this back to me and thanked me for calling

## 2015-10-27 NOTE — Telephone Encounter (Signed)
  Please ask patient to be seen by PCP today to evaluate source of fever and appropriate antibiotic. Will need CBC with diff as part of evaluation as patient on Pomalyst.  M

## 2015-10-28 ENCOUNTER — Telehealth: Payer: Self-pay | Admitting: Hematology and Oncology

## 2015-10-28 ENCOUNTER — Encounter: Payer: Self-pay | Admitting: Hematology and Oncology

## 2015-10-28 ENCOUNTER — Other Ambulatory Visit: Payer: Self-pay | Admitting: Hematology and Oncology

## 2015-10-28 NOTE — Telephone Encounter (Signed)
Re:  Fever  Called patient yesterday evening.  He had called earlier in the day regarding cancellation of his bone marrow secondary to a fever.  He began to feel sick the day before.  He started with a scratchy throat then congestion.  He described being achy.  He denied any cough, shortness of breath, urinary symptoms, or Gi symptoms.  He was seen by his primary physician who diagnosed him with the flu.  He was started on Tamiflu.  A CBC with diff was drawn (no result available).  Based on his prior CBCs, his counts should be good.  When talking to him last night, his fever had broken.  He was sweating.  I discussed the need to contact our office if he was not doing well for re-evaluation.  If he or his wife were concerned about how he was doing, he should be brought to the emergency room.  Overall, he stated that he felt better.  Lequita Asal, MD

## 2015-11-01 ENCOUNTER — Other Ambulatory Visit: Payer: Self-pay | Admitting: Hematology and Oncology

## 2015-11-01 ENCOUNTER — Encounter: Payer: Self-pay | Admitting: Hematology and Oncology

## 2015-11-03 ENCOUNTER — Other Ambulatory Visit: Payer: Self-pay | Admitting: Radiology

## 2015-11-03 ENCOUNTER — Ambulatory Visit: Payer: Medicare Other | Admitting: Hematology and Oncology

## 2015-11-04 ENCOUNTER — Other Ambulatory Visit: Payer: Self-pay | Admitting: *Deleted

## 2015-11-04 ENCOUNTER — Ambulatory Visit
Admission: RE | Admit: 2015-11-04 | Discharge: 2015-11-04 | Disposition: A | Discharge status: Home / Self Care | Payer: Medicare Other | Source: Ambulatory Visit | Attending: Hematology and Oncology | Admitting: Hematology and Oncology

## 2015-11-04 DIAGNOSIS — Z809 Family history of malignant neoplasm, unspecified: Secondary | ICD-10-CM | POA: Insufficient documentation

## 2015-11-04 DIAGNOSIS — Z01812 Encounter for preprocedural laboratory examination: Secondary | ICD-10-CM | POA: Diagnosis present

## 2015-11-04 DIAGNOSIS — Z8489 Family history of other specified conditions: Secondary | ICD-10-CM | POA: Insufficient documentation

## 2015-11-04 DIAGNOSIS — R972 Elevated prostate specific antigen [PSA]: Secondary | ICD-10-CM | POA: Insufficient documentation

## 2015-11-04 DIAGNOSIS — D539 Nutritional anemia, unspecified: Secondary | ICD-10-CM | POA: Insufficient documentation

## 2015-11-04 DIAGNOSIS — Z86711 Personal history of pulmonary embolism: Secondary | ICD-10-CM | POA: Insufficient documentation

## 2015-11-04 DIAGNOSIS — I4891 Unspecified atrial fibrillation: Secondary | ICD-10-CM | POA: Insufficient documentation

## 2015-11-04 DIAGNOSIS — N400 Enlarged prostate without lower urinary tract symptoms: Secondary | ICD-10-CM | POA: Diagnosis not present

## 2015-11-04 DIAGNOSIS — E785 Hyperlipidemia, unspecified: Secondary | ICD-10-CM | POA: Insufficient documentation

## 2015-11-04 DIAGNOSIS — K219 Gastro-esophageal reflux disease without esophagitis: Secondary | ICD-10-CM | POA: Insufficient documentation

## 2015-11-04 DIAGNOSIS — C9002 Multiple myeloma in relapse: Secondary | ICD-10-CM | POA: Diagnosis present

## 2015-11-04 DIAGNOSIS — Z87891 Personal history of nicotine dependence: Secondary | ICD-10-CM | POA: Insufficient documentation

## 2015-11-04 DIAGNOSIS — Z8673 Personal history of transient ischemic attack (TIA), and cerebral infarction without residual deficits: Secondary | ICD-10-CM | POA: Insufficient documentation

## 2015-11-04 DIAGNOSIS — Z8042 Family history of malignant neoplasm of prostate: Secondary | ICD-10-CM | POA: Insufficient documentation

## 2015-11-04 DIAGNOSIS — I10 Essential (primary) hypertension: Secondary | ICD-10-CM | POA: Diagnosis not present

## 2015-11-04 LAB — DIFFERENTIAL
Band Neutrophils: 4 %
Basophils Absolute: 0 10*3/uL (ref 0–0.1)
Basophils Relative: 0 %
Blasts: 0 %
Eosinophils Absolute: 0.1 10*3/uL (ref 0–0.7)
Eosinophils Relative: 1 %
Lymphocytes Relative: 12 %
Lymphs Abs: 0.7 10*3/uL — ABNORMAL LOW (ref 1.0–3.6)
Metamyelocytes Relative: 0 %
Monocytes Absolute: 1.1 10*3/uL — ABNORMAL HIGH (ref 0.2–1.0)
Monocytes Relative: 17 %
Myelocytes: 0 %
Neutro Abs: 4.3 10*3/uL (ref 1.4–6.5)
Neutrophils Relative %: 66 %
Other: 0 %
Promyelocytes Absolute: 0 %
nRBC: 0 /100 WBC

## 2015-11-04 LAB — CBC
HEMATOCRIT: 30.7 % — AB (ref 40.0–52.0)
HEMOGLOBIN: 10 g/dL — AB (ref 13.0–18.0)
MCH: 33.3 pg (ref 26.0–34.0)
MCHC: 32.7 g/dL (ref 32.0–36.0)
MCV: 102.1 fL — ABNORMAL HIGH (ref 80.0–100.0)
Platelets: 221 10*3/uL (ref 150–440)
RBC: 3.01 MIL/uL — ABNORMAL LOW (ref 4.40–5.90)
RDW: 18.2 % — ABNORMAL HIGH (ref 11.5–14.5)
WBC: 6.2 10*3/uL (ref 3.8–10.6)

## 2015-11-04 MED ORDER — MIDAZOLAM HCL 2 MG/2ML IJ SOLN
INTRAMUSCULAR | Status: AC | PRN
  Administered 2015-11-04 (×3): 1 mg via INTRAVENOUS

## 2015-11-04 MED ORDER — SODIUM CHLORIDE 0.9 % IV SOLN: Freq: Once | INTRAVENOUS | Status: DC

## 2015-11-04 MED ORDER — FENTANYL CITRATE (PF) 100 MCG/2ML IJ SOLN
INTRAMUSCULAR | Status: AC | PRN
  Administered 2015-11-04 (×2): 50 ug via INTRAVENOUS

## 2015-11-04 NOTE — H&P (Signed)
Chief Complaint: Here for BM asp and core bx for myeloma Referring Physician(s): Corcoran,Melissa C  History of Present Illness: Logan Perez is a 70 y.o. male with history of MM.  S/p remote stem cell transplant.  Now relapsed.  No complaints today.  Here for CT guided BM ASP and core bx for restaging.  Past Medical History  Diagnosis Date  . Pulmonary embolism ([place])   . Stroke ([place])   . GERD (gastroesophageal reflux disease)   . Hypertension   . Atrial fibrillation ([place])   . HLD (hyperlipidemia)   . Multiple myeloma ([place])   . Elevated PSA   . BPH (benign prostatic hyperplasia)   . Difficulty voiding     Past Surgical History  Procedure Laterality Date  . Back surgery  1994  . Knee arthroscopy Left 1992  . Pain pump implantation  2012  . Stem cell implant  2008    [facility]    Allergies: Zometa and Rivaroxaban  Medications: Prior to Admission medications   Medication Sig Start Date End Date Taking? Authorizing Provider  ALPRAZolam (XANAX) 0.25 MG tablet Take 1 tablet (0.25 mg total) by mouth at bedtime as needed for anxiety. 07/19/15  Yes Evlyn Kanner, NP  atorvastatin (LIPITOR) 10 MG tablet Take by mouth daily at 6 PM.  03/26/15  Yes Historical Provider, MD  calcium citrate-vitamin D (CITRACAL+D) 315-200 MG-UNIT per tablet Take 1 tablet by mouth daily.    Yes Historical Provider, MD  cetirizine (ZYRTEC) 10 MG tablet Take 10 mg by mouth daily.    Yes Historical Provider, MD  Cholecalciferol (VITAMIN D3) 2000 UNITS capsule Take 2,000 Units by mouth daily.    Yes Historical Provider, MD  Cyanocobalamin (RA VITAMIN B-12 TR) 1000 MCG TBCR Take 1,000 mcg by mouth daily.    Yes Historical Provider, MD  diltiazem (CARDIZEM CD) 120 MG 24 hr capsule TAKE 1 CAPSULE ONCE DAILY 04/06/15  Yes Historical Provider, MD  diltiazem (CARDIZEM) 60 MG tablet Take 60 mg by mouth.    Yes Historical Provider, MD  ELIQUIS 5 MG TABS tablet TK 1 T PO TWICE DAILY 03/25/15  Yes  Historical Provider, MD  ibuprofen (ADVIL,MOTRIN) 200 MG tablet Take 200 mg by mouth every 6 (six) hours as needed for mild pain.    Yes Historical Provider, MD  loperamide (IMODIUM) 2 MG capsule Take 2 mg by mouth as needed for diarrhea or loose stools.    Yes Historical Provider, MD  omeprazole (PRILOSEC) 20 MG capsule Take 20 mg by mouth daily.    Yes Historical Provider, MD  PAIN MANAGEMENT IT PUMP REFILL 1 each by Intrathecal route once. Medication: PF Fentanyl 1,500.0 mcg/ml PF Bupivicaine 30.0 mg/ml PF Clonidine 300.18mg/ml Total Volume: 40 ml Needed by 08-26-15 @ 1340 08/26/15  Yes FMilinda Pointer MD  pomalidomide (POMALYST) 4 MG capsule Take 1 capsule (4 mg total) by mouth daily. Take with water on days 1-21. Repeat every 28 days. 08/20/15  Yes MLequita Asal MD  potassium chloride SA (K-DUR,KLOR-CON) 20 MEQ tablet TAKE 1 TABLET(20 MEQ) BY MOUTH TWICE DAILY 09/27/15  Yes MLequita Asal MD  Probiotic Product ([place] CAPS Take 1 capsule by mouth daily.   Yes Historical Provider, MD  tamsulosin (FLOMAX) 0.4 MG CAPS capsule Take 1 capsule (0.4 mg total) by mouth daily. 04/29/15  Yes Shannon A McGowan, PA-C  baclofen (LIORESAL) 10 MG tablet Take 1 tablet (10 mg total) by mouth at bedtime. 08/26/15  Milinda Pointer, MD  dexamethasone (DECADRON) 4 MG tablet TAKE 5 TABLETS BY MOUTH 1 TIME A WEEK 11/01/15   Lequita Asal, MD  naproxen sodium (RA NAPROXEN SODIUM) 220 MG tablet Take by mouth.    Historical Provider, MD  ranitidine (ZANTAC) 150 MG tablet Take by mouth.    Historical Provider, MD     Family History  Problem Relation Age of Onset  . Cancer Father     throat  . Bladder Cancer Neg Hx   . Kidney disease Sister   . Prostate cancer Neg Hx   . Stroke      Social History   Social History  . Marital Status: Married    Spouse Name: N/A  . Number of Children: N/A  . Years of Education: N/A   Social History Main Topics  . Smoking status:  Former Smoker -- 2.50 packs/day for 12 years    Types: Cigarettes  . Smokeless tobacco: Not on file     Comment: Quit 40 years ago  . Alcohol Use: No  . Drug Use: No  . Sexual Activity: Not on file   Other Topics Concern  . Not on file   Social History Narrative    ECOG Status: 1 - Symptomatic but completely ambulatory  Review of Systems: A 12 point ROS discussed and pertinent positives are indicated in the HPI above.  All other systems are negative.  Review of Systems  Vital Signs: BP 142/83 mmHg  Pulse 64  Temp(Src) 97.7 F (36.5 C) (Oral)  Resp 11  Ht '5\' 7"'  (1.702 m)  Wt 166 lb (75.297 kg)  BMI 25.99 kg/m2  SpO2 99%  Physical Exam  Constitutional: He is oriented to person, place, and time. He appears well-developed and well-nourished. No distress.  Cardiovascular: Normal rate and regular rhythm.   Pulmonary/Chest: Effort normal and breath sounds normal. He has no wheezes. He has no rales.  Abdominal: Soft. Bowel sounds are normal. He exhibits no distension.  Neurological: He is alert and oriented to person, place, and time.  Skin: Skin is warm and dry. No rash noted. He is not diaphoretic. No erythema.  Psychiatric: He has a normal mood and affect. His behavior is normal.    Mallampati Score:   1  Imaging: Dg Bone Survey Met  10/21/2015  CLINICAL DATA:  Restaging of myeloma EXAM: METASTATIC BONE SURVEY COMPARISON:  Bone survey of December 08, 2014 FINDINGS: Skull: There are numerous subcentimeter lucencies in the frontal and parietal bones. These are more conspicuous on today's study. Chest x-ray: The lungs are hypoinflated. The interstitial markings are coarse at the left lung base. The heart and pulmonary vascularity are normal. The mediastinum is normal in width. The observed ribs appear intact as do the clavicles. Spine: There is diffuse osteopenia. There is are wedge compressions of T6 and T11 and L1 which are stable. A nerve stimulator electrode extends to the T9  region and is stable. There is moderate multilevel degenerative disc disease at multiple spinal levels. S1 is transitional. Upper extremities: No lytic or blastic lesion is in the upper extremities are observed. Pelvis and lower extremities: The bony pelvis is osteopenic. There are mild degenerative changes of the SI joints. The sacrum is grossly normal. There is a stable rim calcified lucency in the superior aspect of the right femoral neck. There is mild narrowing of both hip joint spaces. There are no definite lytic or blastic lesions within the femurs or tibias or fibulas. IMPRESSION: 1. Increase  conspicuity of subcentimeter lytic lesions in the calvarium. Elsewhere no significant lytic bony lesions are observed. 2. Stable compression of T6, T11, and L1. Stable multilevel degenerative disc disease of the spine. Electronically Signed   By: David  Martinique M.D.   On: 10/21/2015 15:55    Labs:  CBC:  Recent Labs  09/15/15 1053 09/22/15 0920 10/20/15 0920 11/04/15 1029  WBC 3.3* 5.0 5.2 6.2  HGB 9.6* 10.0* 10.6* 10.0*  HCT 28.9* 30.1* 32.2* 30.7*  PLT 119* 186 177 221    COAGS: No results for input(s): INR, APTT in the last 8760 hours.  BMP:  Recent Labs  08/04/15 0858 08/25/15 0937 09/22/15 0920 10/20/15 0920  NA 136 139 138 137  K 3.4* 3.7 3.4* 3.6  CL 101 106 105 104  CO2 '25 24 22 25  ' GLUCOSE 116* 110* 93 114*  BUN 22* 25* 29* 26*  CALCIUM 8.9 9.0 9.0 9.0  CREATININE 1.13 1.15 1.20 1.08  GFRNONAA >60 >60 60* >60  GFRAA >60 >60 >60 >60    LIVER FUNCTION TESTS:  Recent Labs  08/04/15 0858 08/25/15 0937 09/22/15 0920 10/20/15 0920  BILITOT 0.6 0.5 0.8 0.9  AST 32 16 19 12*  ALT '30 23 20 18  ' ALKPHOS 44 52 57 53  PROT 6.2* 6.2* 6.5 6.4*  ALBUMIN 3.2* 3.1* 3.4* 3.5    TUMOR MARKERS: No results for input(s): AFPTM, CEA, CA199, CHROMGRNA in the last 8760 hours.  Assessment and Plan:  Recurrent Myeloma.  For repeat staging BM ASP and Core BX today.  Consent  obtained including risks of pain and bleeding.  Thank you for this interesting consult.  I greatly enjoyed meeting Saintclair Schroader and look forward to participating in their care.  A copy of this report was sent to the requesting provider on this date.  Electronically Signed: Greggory Keen 11/04/2015, 11:20 AM   I spent a total of  30 Minutes   in face to face in clinical consultation, greater than 50% of which was counseling/coordinating care for with multiple myeloma

## 2015-11-04 NOTE — Procedures (Signed)
S/p RT ILIAC BM ASP AND CORE No comp Stable Full report in PACS PATH pending

## 2015-11-04 NOTE — Discharge Instructions (Signed)
Bone Marrow Aspiration and Bone Marrow Biopsy, Care After °Refer to this sheet in the next few weeks. These instructions provide you with information about caring for yourself after your procedure. Your health care provider may also give you more specific instructions. Your treatment has been planned according to current medical practices, but problems sometimes occur. Call your health care provider if you have any problems or questions after your procedure. °WHAT TO EXPECT AFTER THE PROCEDURE °After your procedure, it is common to have: °· Soreness or tenderness around the puncture site. °· Bruising. °HOME CARE INSTRUCTIONS °· Take medicines only as directed by your health care provider. °· Follow your health care provider's instructions about: °¨ Puncture site care. °¨ Bandage (dressing) changes and removal. °· Bathe and shower as directed by your health care provider. °· Check your puncture site every day for signs of infection. Watch for: °¨ Redness, swelling, or pain. °¨ Fluid, blood, or pus. °· Return to your normal activities as directed by your health care provider. °· Keep all follow-up visits as directed by your health care provider. This is important. °SEEK MEDICAL CARE IF: °· You have a fever. °· You have uncontrollable bleeding. °· You have redness, swelling, or pain at the site of your puncture. °· You have fluid, blood, or pus coming from your puncture site. °  °This information is not intended to replace advice given to you by your health care provider. Make sure you discuss any questions you have with your health care provider. °  °Document Released: 04/14/2005 Document Revised: 02/09/2015 Document Reviewed: 09/16/2014 °Elsevier Interactive Patient Education ©2016 [employer] ° °

## 2015-11-10 ENCOUNTER — Telehealth: Payer: Self-pay | Admitting: Pain Medicine

## 2015-11-10 ENCOUNTER — Encounter: Payer: Self-pay | Admitting: Hematology and Oncology

## 2015-11-10 ENCOUNTER — Inpatient Hospital Stay: Payer: Medicare Other | Attending: Hematology and Oncology | Admitting: Hematology and Oncology

## 2015-11-10 VITALS — BP 135/73 | HR 73 | Temp 95.6°F | Resp 17 | Ht 67.0 in | Wt 162.9 lb

## 2015-11-10 DIAGNOSIS — G629 Polyneuropathy, unspecified: Secondary | ICD-10-CM | POA: Insufficient documentation

## 2015-11-10 DIAGNOSIS — I10 Essential (primary) hypertension: Secondary | ICD-10-CM | POA: Insufficient documentation

## 2015-11-10 DIAGNOSIS — Z87891 Personal history of nicotine dependence: Secondary | ICD-10-CM | POA: Insufficient documentation

## 2015-11-10 DIAGNOSIS — Z9484 Stem cells transplant status: Secondary | ICD-10-CM

## 2015-11-10 DIAGNOSIS — C9002 Multiple myeloma in relapse: Secondary | ICD-10-CM

## 2015-11-10 DIAGNOSIS — E876 Hypokalemia: Secondary | ICD-10-CM | POA: Insufficient documentation

## 2015-11-10 DIAGNOSIS — R252 Cramp and spasm: Secondary | ICD-10-CM | POA: Diagnosis not present

## 2015-11-10 DIAGNOSIS — M87180 Osteonecrosis due to drugs, jaw: Secondary | ICD-10-CM | POA: Diagnosis not present

## 2015-11-10 DIAGNOSIS — K219 Gastro-esophageal reflux disease without esophagitis: Secondary | ICD-10-CM | POA: Insufficient documentation

## 2015-11-10 DIAGNOSIS — M549 Dorsalgia, unspecified: Secondary | ICD-10-CM | POA: Diagnosis not present

## 2015-11-10 DIAGNOSIS — T50995S Adverse effect of other drugs, medicaments and biological substances, sequela: Secondary | ICD-10-CM | POA: Diagnosis not present

## 2015-11-10 DIAGNOSIS — G8929 Other chronic pain: Secondary | ICD-10-CM | POA: Insufficient documentation

## 2015-11-10 DIAGNOSIS — Z978 Presence of other specified devices: Secondary | ICD-10-CM | POA: Diagnosis not present

## 2015-11-10 DIAGNOSIS — R197 Diarrhea, unspecified: Secondary | ICD-10-CM | POA: Diagnosis not present

## 2015-11-10 DIAGNOSIS — Z8701 Personal history of pneumonia (recurrent): Secondary | ICD-10-CM | POA: Diagnosis not present

## 2015-11-10 DIAGNOSIS — I4891 Unspecified atrial fibrillation: Secondary | ICD-10-CM | POA: Diagnosis not present

## 2015-11-10 DIAGNOSIS — Z86711 Personal history of pulmonary embolism: Secondary | ICD-10-CM | POA: Diagnosis not present

## 2015-11-10 DIAGNOSIS — Z9221 Personal history of antineoplastic chemotherapy: Secondary | ICD-10-CM | POA: Diagnosis not present

## 2015-11-10 DIAGNOSIS — E785 Hyperlipidemia, unspecified: Secondary | ICD-10-CM | POA: Diagnosis not present

## 2015-11-10 DIAGNOSIS — N400 Enlarged prostate without lower urinary tract symptoms: Secondary | ICD-10-CM | POA: Diagnosis not present

## 2015-11-10 DIAGNOSIS — Z7901 Long term (current) use of anticoagulants: Secondary | ICD-10-CM | POA: Diagnosis not present

## 2015-11-10 DIAGNOSIS — Z8673 Personal history of transient ischemic attack (TIA), and cerebral infarction without residual deficits: Secondary | ICD-10-CM | POA: Diagnosis not present

## 2015-11-10 DIAGNOSIS — Z8051 Family history of malignant neoplasm of kidney: Secondary | ICD-10-CM | POA: Insufficient documentation

## 2015-11-10 DIAGNOSIS — R1011 Right upper quadrant pain: Secondary | ICD-10-CM | POA: Insufficient documentation

## 2015-11-10 DIAGNOSIS — Z79899 Other long term (current) drug therapy: Secondary | ICD-10-CM | POA: Diagnosis not present

## 2015-11-10 NOTE — Progress Notes (Signed)
[facility] day:  11/10/2015  Chief Complaint: Logan Perez is a 70 y.o. 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  HPI: The patient was last seen in the medical oncology clinic on 10/20/2015.  At that time, he was seen for 1 month assessment prior to cycle #3 Pomalyst and Decadron.  Symptomatically, he was doing well.  He had intermittent small volume diarrhea.  CBC included a hematocrit of 32.2, hemoglobin 10.6, MCV 104.2, platelets 177,000, WBC 5200 with an ANC of 2900.  CMP included a creatinine of 1.08, protein 6.4, albumen 3.5, and calcium 9.0.  Kappa free light chains were 453.4, lambda free light chains 7.57 with a ratio of 59.89.  SPEP revealed 0.5 gm/dL monoclonal spike.  Bone survey on 10/21/2015 revealed increase conspicuity of subcentimeter lytic lesions in the calvarium.  Otherwise, there were no significant lytic bony lesions were observed.  There were stable compression of T6, T11, and L1. There were stable multilevel degenerative disc disease of the spine.  He was scheduled to undergo bone marrow aspirate on 10/25/2015.  Bone marrow was postponed secondary to a fever.  He was seen by his primary care physician on 10/27/2015 with fever (100.9) and flu-like symptoms.  He was treated with Tamiflu.  He states that he got better.  He has a residual runny nose.  He underwent bone marrow aspirate and biopsy on 11/04/2015.  Pathology revealed an atypical monoclonal plasma cells estimated at 30-40% of marrow cells.   Marrow was variably cellular (approximately 45%) with background trilineage hematopoiesis. There was no significant increase in marrow reticulin fibers. Storage iron was present.  Flow cytometry revealed a monoclonal plasma cell population of 3.5% (underestimate).  Symptomatically, he notes being  dizzy and lightheaded when getting up from a squatting position at [place] while reading magazines.  This has not happened at home.  He notes that his diarrhea appears diet related.  He is  Trying to limit his diet to 1 dairy product a day.  He believes Lactaid does not help.  Past Medical History  Diagnosis Date  . Pulmonary embolism ([place])   . Stroke ([place])   . GERD (gastroesophageal reflux disease)   . Hypertension   . Atrial fibrillation ([place])   . HLD (hyperlipidemia)   . Multiple myeloma ([place])   . Elevated PSA   . BPH (benign prostatic hyperplasia)   . Difficulty voiding   . Anxiety     Past Surgical History  Procedure Laterality Date  . Back surgery  1994  . Knee arthroscopy Left 1992  . Pain pump implantation  2012  . Stem cell implant  2008    [facility]    Family History  Problem Relation Age of Onset  . Cancer Father     throat  . Bladder Cancer Neg Hx   . Kidney disease Sister   . Prostate cancer Neg Hx   . Stroke      Social History:  reports that he has quit smoking. His smoking use included Cigarettes. He has a 30 pack-year smoking history. He does not have any smokeless tobacco history on file. He reports that he does not drink alcohol or use illicit drugs.  The patient is accompanied by his wife, [name], today.  Allergies:  Allergies  Allergen Reactions  . Azithromycin Diarrhea    Possible cause of  C. Diff  . Zometa [Zoledronic Acid] Other (See Comments)    Osteonecrosis of the jaw  . Rivaroxaban Rash    Current Medications: Current Outpatient Prescriptions  Medication Sig Dispense Refill  . ALPRAZolam (XANAX) 0.25 MG tablet Take 1 tablet (0.25 mg total) by mouth at bedtime as needed for anxiety. 30 tablet 3  . atorvastatin (LIPITOR) 10 MG tablet Take by mouth daily at 6 PM.     . baclofen (LIORESAL) 10 MG tablet Take 1 tablet (10 mg total) by mouth at bedtime. 30 each 3  . calcium citrate-vitamin D (CITRACAL+D) 315-200 MG-UNIT per tablet Take 1 tablet by  mouth daily.     . cetirizine (ZYRTEC) 10 MG tablet Take 10 mg by mouth daily.     . Cholecalciferol (VITAMIN D3) 2000 UNITS capsule Take 2,000 Units by mouth daily.     . Cyanocobalamin (RA VITAMIN B-12 TR) 1000 MCG TBCR Take 1,000 mcg by mouth daily.     . dexamethasone (DECADRON) 4 MG tablet TAKE 5 TABLETS BY MOUTH 1 TIME A WEEK 21 tablet 0  . diltiazem (CARDIZEM CD) 120 MG 24 hr capsule TAKE 1 CAPSULE ONCE DAILY    . diltiazem (CARDIZEM) 60 MG tablet Take 60 mg by mouth.     . ELIQUIS 5 MG TABS tablet TK 1 T PO TWICE DAILY  3  . ibuprofen (ADVIL,MOTRIN) 200 MG tablet Take 200 mg by mouth every 6 (six) hours as needed for mild pain.     . loperamide (IMODIUM) 2 MG capsule Take 2 mg by mouth as needed for diarrhea or loose stools.     . omeprazole (PRILOSEC) 20 MG capsule Take 20 mg by mouth daily.     . PAIN MANAGEMENT IT PUMP REFILL 1 each by Intrathecal route once. Medication: PF Fentanyl 1,500.0 mcg/ml PF Bupivicaine 30.0 mg/ml PF Clonidine 300.0mcg/ml Total Volume: 40 ml Needed by 08-26-15 @ 1340 1 each 0  . pomalidomide (POMALYST) 4 MG capsule Take 1 capsule (4 mg total) by mouth daily. Take with water on days 1-21. Repeat every 28 days. 21 capsule 0  . potassium chloride SA (K-DUR,KLOR-CON) 20 MEQ tablet TAKE 1 TABLET(20 MEQ) BY MOUTH TWICE DAILY 180 tablet 0  . Probiotic Product (PHILLIPS COLON HEALTH) CAPS Take 1 capsule by mouth daily.    . ranitidine (ZANTAC) 150 MG tablet Take by mouth.    . tamsulosin (FLOMAX) 0.4 MG CAPS capsule Take 1 capsule (0.4 mg total) by mouth daily. 90 capsule 3   No current facility-administered medications for this visit.    Review of Systems:  GENERAL:  Feels weaker.  No fevers or sweats .  Weight down 4 pounds. PERFORMANCE STATUS (ECOG): 1 HEENT:  Cataracts.  Runny nose.  No sore throat, mouth sores or tenderness. Lungs: No shortness of breath or cough.  No hemoptysis. Cardiac:  No chest pain, palpitations, orthopnea, or PND. GI:  Little  diarrhea (appears diet related).  No nausea, vomiting, constipation, melena or hematochezia. GU:  No urgency, frequency, dysuria, or hematuria. Musculoskeletal:  Compression fracture of back for which he remains on a pain pump (chronic).  No joint pain.  No muscle tenderness. Extremities:  No pain or swelling. Skin:  Fragile skin.  No rashes or skin changes. Neuro:  Little tremor, getting worse over the years.  Neuropathy in feet (see HPI).  No headache, weakness, balance or coordination issues. Endocrine:  No diabetes, thyroid issues, hot flashes or night sweats. Psych:  No mood changes, depression   or anxiety. Pain:  No focal pain. Review of systems:  All other systems reviewed and found to be negative.   Physical Exam: Blood pressure 135/73, pulse 73, temperature 95.6 F (35.3 C), temperature source Tympanic, resp. rate 17, height 5' 7" (1.702 m), weight 162 lb 14.7 oz (73.9 kg). GENERAL:  Well developed, well nourished, sitting comfortably in the exam room in no acute distress. MENTAL STATUS:  Alert and oriented to person, place and time. HEAD:  Pearline Cables short hair with goatee.  Normocephalic, atraumatic, face symmetric, no Cushingoid features. EYES:  Glasses.  Blue eyes.  Pupils equal round and reactive to light and accomodation.  No conjunctivitis or scleral icterus. ENT:  Oropharynx clear without lesion.  Hearing aide.  Tongue normal. Mucous membranes moist.  RESPIRATORY:  Clear to auscultation without rales, wheezes or rhonchi. CARDIOVASCULAR:  Regular rate and rhythm without murmur, rub or gallop. ABDOMEN:  RUQ pain pump.  Soft, non-tender, with active bowel sounds, and no hepatosplenomegaly.  No masses. SKIN:  No rashes, ulcers or lesions. EXTREMITIES: No edema, no skin discoloration or tenderness.  No palpable cords. LYMPH NODES: No palpable cervical, supraclavicular, axillary or inguinal adenopathy  NEUROLOGICAL: Intention tremor. PSYCH:  Appropriate.   No visits with results  within 3 Day(s) from this visit. Latest known visit with results is:  Hospital Outpatient Visit on 11/04/2015  Component Date Value Ref Range Status  . WBC 11/04/2015 6.2  3.8 - 10.6 K/uL Final  . RBC 11/04/2015 3.01* 4.40 - 5.90 MIL/uL Final  . Hemoglobin 11/04/2015 10.0* 13.0 - 18.0 g/dL Final  . HCT 11/04/2015 30.7* 40.0 - 52.0 % Final  . MCV 11/04/2015 102.1* 80.0 - 100.0 fL Final  . MCH 11/04/2015 33.3  26.0 - 34.0 pg Final  . MCHC 11/04/2015 32.7  32.0 - 36.0 g/dL Final  . RDW 11/04/2015 18.2* 11.5 - 14.5 % Final  . Platelets 11/04/2015 221  150 - 440 K/uL Final  . Neutro Abs 11/04/2015 4.3  1.4 - 6.5 K/uL Final  . Lymphs Abs 11/04/2015 0.7* 1.0 - 3.6 K/uL Final  . Monocytes Absolute 11/04/2015 1.1* 0.2 - 1.0 K/uL Final  . Eosinophils Absolute 11/04/2015 0.1  0 - 0.7 K/uL Final  . Basophils Absolute 11/04/2015 0.0  0 - 0.1 K/uL Final  . Neutrophils Relative % 11/04/2015 66   Final  . Lymphocytes Relative 11/04/2015 12   Final  . Monocytes Relative 11/04/2015 17   Final  . Eosinophils Relative 11/04/2015 1   Final  . Basophils Relative 11/04/2015 0   Final  . Band Neutrophils 11/04/2015 4   Final  . Metamyelocytes Relative 11/04/2015 0   Final  . Myelocytes 11/04/2015 0   Final  . Promyelocytes Absolute 11/04/2015 0   Final  . Blasts 11/04/2015 0   Final  . nRBC 11/04/2015 0  0 /100 WBC Final  . Other 11/04/2015 0   Final  . RBC Morphology 11/04/2015 MIXED RBC POPULATION   Final  . Smear Review 11/04/2015 PLATELET CLUMPS NOTED ON SMEAR   Final    Assessment:  Logan Perez is a 70 y.o. male with stage III mutiple myeloma.  He initially presented with progressive back pain beginning in 12/2006.  MRI revealed "spots and compression fractures".  He began Velcade, thalidomide, and Decadron.  In 08/2007, he underwent high dose chemotherapy and autologous stem cell transplant.  He recurred with a rising M-spike (2.7) with repeat M spike (1.7 gm/dl) in 03/2010.  He was  initially treated  with Velcade (02/08/2010 - 05/10/2010).  He then began Revlimid (15 mg 3 weeks on/1 week off) and Decadron (40 mg on day 1, 8, 15, 22).  Because of significant side effect with Decadron his dose was decreased to 10 mg once a week in 07/2010.    He was on maintenance Revlimid. Revlimid was initially10 mg 3 weeks on/1 week off. This was changed to 10 mg 2 weeks on/2 weeks off secondary to right nipple tenderness. His dose was increased to 10 mg 3 weeks on/1 week off with Decadron 10 mg a week (on Sundays) and then Revlamid 15 mg 3 weeks on and 1 week off with Decadron on Sundays.  He began Pomalyst 4 mg 3 weeks on/1 week off with Decadron on 08/27/2015.  Over the past year his SPEP has revealed no monoclonal protein (04/21/2015) and 0.5 gm/dL on 09/22/2015 and 10/20/2015. Free light chains have been monitored. Kappa free light chains were 18.54 on 11/21/2013, 18.37 on 02/20/2014, 18.93 on 05/22/2014, 32.58 (high; normal ratio 1.27) on 08/21/2014, 51.53 (high; elevated ratio of 2.12) on 11/13/2014, 28.08 (ratio 1.73) on 12/09/2014, 23.71 (ratio 2.17) on 01/18/2015, 92.93 (ratio 9.49) on 04/21/2015, 93.44 (ratio 10.28) on 05/26/2015, 255.45 (ratio of 24.05) on 07/14/2015, 373.89 (ratio 48.31) on 08/04/2015, 474.33 (ratio 70.58) on 08/25/2015, 450.76 (ratio 55.44) on 09/22/2015, and 453.4 (ratio 59.89) on 10/20/2015.   Bone survey on 12/08/2014 was stable.  Bone survey on 10/21/2015 revealed increase conspicuity of subcentimeter lytic lesions in the calvarium.  Bone marrow aspirate and biopsy on 11/04/2015 revealed an atypical monoclonal plasma cells estimated at 30-40% of marrow cells.   Marrow was variably cellular (approximately 45%) with background trilineage hematopoiesis. There was no significant increase in marrow reticulin fibers. Storage iron was present.    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 He develoed herpes  zoster in 04/2008. He developed a pulmonary embolism in 05/2013. He was initially on Xarelto, but is now on Eliquis. He had an episode of pneumonia around this time requiring a brief admission. He developed severe lower leg cramps on 08/07/2014 secondary to hypokalemia. Duplex was negative.   He was treated for C difficile colitis (Flagyl completed 07/30/2015).  He has a chronic indwelling pain pump.  He is currently day 20 of cycle #3 Pomalyst and Decadron (08/27/2015 -10/22/2015).  Restaging studies document progressive disease.  Kappa free light chains are increasing.  SPEP now reveals 0.5 gm/dL monoclonal protein.  Bone survey reveals ncrease conspicuity of subcentimeter lytic lesions in the calvarium.  Bone marrow reveals 30-40% plasma cells.   Symptomatically, he is fatigued.  He has intermittent small volume diarrhea felt secondary to dairy products (lactose intolerant).  Plan:  1.  Review interim labs and bone marrow documenting progressive disease.  Discuss possible Kyprolis (carfilzomib).  Discuss coordination of care with [facility] as considering second bone marrow transplant once disease is controlled.  2.  Follow-up with [facility] for regarding treatment options and possible future second transplant. 3.  Complete current cycle of Pomalyst (1 day left). 4.  RTC in 2 weeks for MD assessment.   Melissa C Corcoran, MD  11/10/2015, 11:49 AM 

## 2015-11-10 NOTE — Telephone Encounter (Signed)
Ms. Bowcutt spoke to [facility] and they said Dr. Dossie Arbour send in an addendum with correct codes for bill of 2015/09/09

## 2015-11-10 NOTE — Progress Notes (Signed)
Pt reports last issue with loose stools 1/29.  Pt reports he believes it is related to dairy products and limits to one dairy product daily or tries to.  Pt reports he believes his diareah at times is self inflicted related to diet.  Pt states he saw PCP around 1/18 for flu like symptoms and was placed on Tamiflu.  Reports PCP diagnosed based on many recent cases and not because of a test result.  Pt reports having fever 101.9 with this last cold or flu. No other concerns.

## 2015-11-17 ENCOUNTER — Other Ambulatory Visit: Payer: Self-pay | Admitting: Family Medicine

## 2015-11-18 ENCOUNTER — Other Ambulatory Visit: Payer: Self-pay | Admitting: Family Medicine

## 2015-11-18 DIAGNOSIS — C9000 Multiple myeloma not having achieved remission: Secondary | ICD-10-CM

## 2015-11-19 ENCOUNTER — Other Ambulatory Visit: Payer: Self-pay | Admitting: Hematology and Oncology

## 2015-11-24 ENCOUNTER — Inpatient Hospital Stay (HOSPITAL_BASED_OUTPATIENT_CLINIC_OR_DEPARTMENT_OTHER): Payer: Medicare Other | Admitting: Hematology and Oncology

## 2015-11-24 ENCOUNTER — Inpatient Hospital Stay: Payer: Medicare Other

## 2015-11-24 VITALS — BP 150/78 | HR 68 | Temp 96.3°F | Resp 18 | Ht 67.0 in | Wt 163.1 lb

## 2015-11-24 DIAGNOSIS — C9002 Multiple myeloma in relapse: Secondary | ICD-10-CM | POA: Diagnosis not present

## 2015-11-24 DIAGNOSIS — C9000 Multiple myeloma not having achieved remission: Secondary | ICD-10-CM

## 2015-11-24 DIAGNOSIS — M87180 Osteonecrosis due to drugs, jaw: Secondary | ICD-10-CM

## 2015-11-24 DIAGNOSIS — Z978 Presence of other specified devices: Secondary | ICD-10-CM

## 2015-11-24 DIAGNOSIS — Z7901 Long term (current) use of anticoagulants: Secondary | ICD-10-CM

## 2015-11-24 DIAGNOSIS — Z9221 Personal history of antineoplastic chemotherapy: Secondary | ICD-10-CM | POA: Diagnosis not present

## 2015-11-24 DIAGNOSIS — Z9484 Stem cells transplant status: Secondary | ICD-10-CM | POA: Diagnosis not present

## 2015-11-24 DIAGNOSIS — E785 Hyperlipidemia, unspecified: Secondary | ICD-10-CM

## 2015-11-24 DIAGNOSIS — T50995S Adverse effect of other drugs, medicaments and biological substances, sequela: Secondary | ICD-10-CM

## 2015-11-24 DIAGNOSIS — M549 Dorsalgia, unspecified: Secondary | ICD-10-CM

## 2015-11-24 DIAGNOSIS — E876 Hypokalemia: Secondary | ICD-10-CM

## 2015-11-24 DIAGNOSIS — G629 Polyneuropathy, unspecified: Secondary | ICD-10-CM

## 2015-11-24 DIAGNOSIS — I10 Essential (primary) hypertension: Secondary | ICD-10-CM

## 2015-11-24 LAB — CBC WITH DIFFERENTIAL/PLATELET
Basophils Absolute: 0 10*3/uL (ref 0–0.1)
EOS ABS: 0 10*3/uL (ref 0–0.7)
Eosinophils Relative: 0 %
HCT: 30.1 % — ABNORMAL LOW (ref 40.0–52.0)
Hemoglobin: 10.1 g/dL — ABNORMAL LOW (ref 13.0–18.0)
LYMPHS ABS: 0.6 10*3/uL — AB (ref 1.0–3.6)
Lymphocytes Relative: 8 %
MCH: 34.4 pg — AB (ref 26.0–34.0)
MCHC: 33.6 g/dL (ref 32.0–36.0)
MCV: 102.3 fL — ABNORMAL HIGH (ref 80.0–100.0)
MONO ABS: 1 10*3/uL (ref 0.2–1.0)
Neutro Abs: 5.5 10*3/uL (ref 1.4–6.5)
Neutrophils Relative %: 78 %
Platelets: 223 10*3/uL (ref 150–440)
RBC: 2.94 MIL/uL — ABNORMAL LOW (ref 4.40–5.90)
RDW: 18.8 % — AB (ref 11.5–14.5)
WBC: 7.1 10*3/uL (ref 3.8–10.6)

## 2015-11-24 MED ORDER — VALACYCLOVIR HCL 500 MG PO TABS: 500.0000 mg | ORAL_TABLET | Freq: Every day | ORAL | Status: DC

## 2015-11-24 NOTE — Progress Notes (Signed)
[facility] day:  11/24/2015  Chief Complaint: Logan Perez is a 70 y.o. 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  HPI: The patient was last seen in the medical oncology clinic on 11/10/2015.  At that time, interval bone marrow was reviewed.  Bone marrow on 11/04/2015 revealed an atypical monoclonal plasma cells estimated at 30-40% of marrow cells.   Marrow was variably cellular (approximately 45%) with background trilineage hematopoiesis. There was no significant increase in marrow reticulin fibers. Storage iron was present.  Flow cytometry revealed a monoclonal plasma cell population of 3.5% (underestimate).  At that time, we discussed the addition of Kyprolis to his Decadron and Pomalyst.  We discussed referred to [facility] to discuss treatment options as well as consolidative second transplant.  During the interim, he has met with Dr. Adriana Simas, medical oncologist, and Dr. Renetta Chalk.  Decision was made to add Carfilzomib (Kyprolis) to his treatment.  His M spike and free light chains will be followed monthly.  A bone marrow will be repeated in 2-3 months to assess response to the treatment. If his m yeloma is responding to treatment , recommendation was to proceed with the 2nd autologous stem cell transplant. Stem cells are already stored from his first transplant.  He states that he started Pomalyst on 11/19/2015.  Past Medical History  Diagnosis Date  . Pulmonary embolism ([place])   . Stroke ([place])   . GERD (gastroesophageal reflux disease)   . Hypertension   . Atrial fibrillation ([place])   . HLD (hyperlipidemia)   . Multiple myeloma ([place])   . Elevated PSA   . BPH (benign prostatic hyperplasia)   . Difficulty voiding   . Anxiety     Past Surgical History  Procedure Laterality Date  . Back surgery  1994  . Knee arthroscopy Left 1992  . Pain  pump implantation  2012  . Stem cell implant  2008    [facility]    Family History  Problem Relation Age of Onset  . Cancer Father     throat  . Bladder Cancer Neg Hx   . Kidney disease Sister   . Prostate cancer Neg Hx   . Stroke      Social History:  reports that he has quit smoking. His smoking use included Cigarettes. He has a 30 pack-year smoking history. He does not have any smokeless tobacco history on file. He reports that he does not drink alcohol or use illicit drugs.  The patient is accompanied by his wife, [name], today.  Allergies:  Allergies  Allergen Reactions  . Azithromycin Diarrhea    Possible cause of C. Diff  . Zometa [Zoledronic Acid] Other (See Comments)    Osteonecrosis of the jaw  . Rivaroxaban Rash    Current Medications: Current Outpatient Prescriptions  Medication Sig Dispense Refill  . ALPRAZolam (XANAX) 0.25 MG tablet TAKE 1 TABLET BY MOUTH EVERY NIGHT AT BEDTIME AS NEEDED FOR ANXIETY. 30 tablet 0  . atorvastatin (LIPITOR) 10 MG tablet Take by mouth daily at 6 PM.     . baclofen (LIORESAL) 10 MG tablet Take 1 tablet (10 mg total) by mouth at bedtime. 30 each 3  . calcium citrate-vitamin D (CITRACAL+D) 315-200 MG-UNIT per tablet Take 1 tablet by mouth daily.     . cetirizine (ZYRTEC) 10 MG tablet Take 10 mg by mouth daily.     [name]  Cholecalciferol (VITAMIN D3) 2000 UNITS capsule Take 2,000 Units by mouth daily.     . Cyanocobalamin (RA VITAMIN B-12 TR) 1000 MCG TBCR Take 1,000 mcg by mouth daily.     [name] dexamethasone (DECADRON) 4 MG tablet TAKE 5 TABLETS BY MOUTH 1 TIME A WEEK 21 tablet 0  . diltiazem (CARDIZEM CD) 120 MG 24 hr capsule TAKE 1 CAPSULE ONCE DAILY    . diltiazem (CARDIZEM) 60 MG tablet Take 60 mg by mouth.     Arne Cleveland 5 MG TABS tablet TK 1 T PO TWICE DAILY  3  . ibuprofen (ADVIL,MOTRIN) 200 MG tablet Take 200 mg by mouth every 6 (six) hours as needed for mild pain.     [name] ketoconazole (NIZORAL) 2 % cream Apply topically.    [name] loperamide  (IMODIUM) 2 MG capsule Take 2 mg by mouth as needed for diarrhea or loose stools.     [name] omeprazole (PRILOSEC) 20 MG capsule Take 20 mg by mouth daily.     [name] PAIN MANAGEMENT IT PUMP REFILL 1 each by Intrathecal route once. Medication: PF Fentanyl 1,500.0 mcg/ml PF Bupivicaine 30.0 mg/ml PF Clonidine 300.78mg/ml Total Volume: 40 ml Needed by 08-26-15 @ 1340 1 each 0  . pomalidomide (POMALYST) 4 MG capsule Take 1 capsule (4 mg total) by mouth daily. Take with water on days 1-21. Repeat every 28 days. 21 capsule 0  . potassium chloride SA (K-DUR,KLOR-CON) 20 MEQ tablet TAKE 1 TABLET(20 MEQ) BY MOUTH TWICE DAILY 180 tablet 0  . Probiotic Product (PVernon CAPS Take 1 capsule by mouth daily.    . ranitidine (ZANTAC) 150 MG tablet Take by mouth.    . tamsulosin (FLOMAX) 0.4 MG CAPS capsule Take 1 capsule (0.4 mg total) by mouth daily. 90 capsule 3   No current facility-administered medications for this visit.    Review of Systems:  GENERAL:  Feels fine.  No fevers or sweats .  Weight up 1 pound. PERFORMANCE STATUS (ECOG): 1 HEENT:  Cataracts. No runny nose, sore throat, mouth sores or tenderness. Lungs: No shortness of breath or cough.  No hemoptysis. Cardiac:  No chest pain, palpitations, orthopnea, or PND. GI:  No nausea, vomiting, diarrhea, constipation, melena or hematochezia. GU:  No urgency, frequency, dysuria, or hematuria. Musculoskeletal:  Compression fracture of back for which he remains on a pain pump (chronic).  No joint pain.  No muscle tenderness. Extremities:  No pain or swelling. Skin:  Fragile skin.  No rashes or skin changes. Neuro:  Little tremor, getting worse over the years.  Neuropathy in feet (see HPI).  No headache, weakness, balance or coordination issues. Endocrine:  No diabetes, thyroid issues, hot flashes or night sweats. Psych:  No mood changes, depression or anxiety. Pain:  No focal pain. Review of systems:  All other systems reviewed and found to  be negative.   Physical Exam: Blood pressure 150/78, pulse 68, temperature 96.3 F (35.7 C), temperature source Tympanic, resp. rate 18, height _0  (1.702 m), weight 163 lb 2.3 oz (74 kg). GENERAL:  Well developed, well nourished, sitting comfortably in the exam room in no acute distress. MENTAL STATUS:  Alert and oriented to person, place and time. HEAD:  GPearline Cablesshort hair with goatee.  Normocephalic, atraumatic, face symmetric, no Cushingoid features. EYES:  Glasses.  Blue eyes.  Pupils equal round and reactive to light and accomodation.  No conjunctivitis or scleral icterus. ENT:  Oropharynx clear without lesion.  Hearing aide.  Tongue normal.  Mucous membranes moist.  RESPIRATORY:  Clear to auscultation without rales, wheezes or rhonchi. CARDIOVASCULAR:  Regular rate and rhythm without murmur, rub or gallop. ABDOMEN:  RUQ pain pump.  Soft, non-tender, with active bowel sounds, and no hepatosplenomegaly.  No masses. SKIN:  No rashes, ulcers or lesions. EXTREMITIES: No edema, no skin discoloration or tenderness.  No palpable cords. LYMPH NODES: No palpable cervical, supraclavicular, axillary or inguinal adenopathy  NEUROLOGICAL: Intention tremor. PSYCH:  Appropriate.   Appointment on 11/24/2015  Component Date Value Ref Range Status  . WBC 11/24/2015 7.1  3.8 - 10.6 K/uL Final  . RBC 11/24/2015 2.94* 4.40 - 5.90 MIL/uL Final  . Hemoglobin 11/24/2015 10.1* 13.0 - 18.0 g/dL Final  . HCT 11/24/2015 30.1* 40.0 - 52.0 % Final  . MCV 11/24/2015 102.3* 80.0 - 100.0 fL Final  . MCH 11/24/2015 34.4* 26.0 - 34.0 pg Final  . MCHC 11/24/2015 33.6  32.0 - 36.0 g/dL Final  . RDW 11/24/2015 18.8* 11.5 - 14.5 % Final  . Platelets 11/24/2015 223  150 - 440 K/uL Final  . Neutrophils Relative % 11/24/2015 78%   Final  . Neutro Abs 11/24/2015 5.5  1.4 - 6.5 K/uL Final  . Lymphocytes Relative 11/24/2015 8%   Final  . Lymphs Abs 11/24/2015 0.6* 1.0 - 3.6 K/uL Final  . Monocytes Relative 11/24/2015 14%    Final  . Monocytes Absolute 11/24/2015 1.0  0.2 - 1.0 K/uL Final  . Eosinophils Relative 11/24/2015 0%   Final  . Eosinophils Absolute 11/24/2015 0.0  0 - 0.7 K/uL Final  . Basophils Relative 11/24/2015 0%   Final  . Basophils Absolute 11/24/2015 0.0  0 - 0.1 K/uL Final    Assessment:  Kitt Ledet is a 70 y.o. male with stage III mutiple myeloma.  He initially presented with progressive back pain beginning in 12/2006.  MRI revealed "spots and compression fractures".  He began Velcade, thalidomide, and Decadron.  In 08/2007, he underwent high dose chemotherapy and autologous stem cell transplant.  He recurred with a rising M-spike (2.7) with repeat M spike (1.7 gm/dl) in 03/2010.  He was initially treated with Velcade (02/08/2010 - 05/10/2010).  He then began Revlimid (15 mg 3 weeks on/1 week off) and Decadron (40 mg on day 1, 8, 15, 22).  Because of significant side effect with Decadron his dose was decreased to 10 mg once a week in 07/2010.    He was on maintenance Revlimid. Revlimid was initially10 mg 3 weeks on/1 week off. This was changed to 10 mg 2 weeks on/2 weeks off secondary to right nipple tenderness. His dose was increased to 10 mg 3 weeks on/1 week off with Decadron 10 mg a week (on Sundays) and then Revlamid 15 mg 3 weeks on and 1 week off with Decadron on Sundays.  He began Pomalyst 4 mg 3 weeks on/1 week off with Decadron on 08/27/2015.  Over the past year his SPEP has revealed no monoclonal protein (04/21/2015) and 0.5 gm/dL on 09/22/2015 and 10/20/2015. Free light chains have been monitored. Kappa free light chains were 18.54 on 11/21/2013, 18.37 on 02/20/2014, 18.93 on 05/22/2014, 32.58 (high; normal ratio 1.27) on 08/21/2014, 51.53 (high; elevated ratio of 2.12) on 11/13/2014, 28.08 (ratio 1.73) on 12/09/2014, 23.71 (ratio 2.17) on 01/18/2015, 92.93 (ratio 9.49) on 04/21/2015, 93.44 (ratio 10.28) on 05/26/2015, 255.45 (ratio of 24.05) on 07/14/2015, 373.89 (ratio  48.31) on 08/04/2015, 474.33 (ratio 70.58) on 08/25/2015, 450.76 (ratio 55.44) on 09/22/2015, and 453.4 (ratio 59.89) on  10/20/2015.   Bone survey on 12/08/2014 was stable.  Bone survey on 10/21/2015 revealed increase conspicuity of subcentimeter lytic lesions in the calvarium.  Bone marrow aspirate and biopsy on 11/04/2015 revealed an atypical monoclonal plasma cells estimated at 30-40% of marrow cells.   Marrow was variably cellular (approximately 45%) with background trilineage hematopoiesis. There was no significant increase in marrow reticulin fibers. Storage iron was present.    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 He develoed herpes zoster in 04/2008. He developed a pulmonary embolism in 05/2013. He was initially on Xarelto, but is now on Eliquis. He had an episode of pneumonia around this time requiring a brief admission. He developed severe lower leg cramps on 08/07/2014 secondary to hypokalemia. Duplex was negative.   He was treated for C difficile colitis (Flagyl completed 07/30/2015).  He has a chronic indwelling pain pump.  He is currently day 6 of cycle #4 Pomalyst and Decadron (08/27/2015 -11/19/2015).  Restaging studies document progressive disease.  Kappa free light chains are increasing.  SPEP now reveals 0.5 gm/dL monoclonal protein.  Bone survey reveals increase conspicuity of subcentimeter lytic lesions in the calvarium.  Bone marrow reveals 30-40% plasma cells.   He has met with the [facility] at [facility] with the plan for 2-3 cycles of Kyprolis, Pomalyst, and Decadron prior to restaging and consideration of a second autologous stem cell transplant.  Plan:  1.  Labs today:  CBC with diff, SPEP, free light chains. 2.  Review treatment plan with Kyprolis, Pomalyst and Decadron.  Carfilzomib 20/27 mg/m2 days 1,2, 8, 9, 15, 16 with pomalidomide 28m po days 1-21, and dexamethasone 246mpo days 1, 8, 15,  22.  Each cycle is 28 days. 3.  Information on Kyprolis today. 4.  Discuss baseline MUGA.  Patient states that he sees his cardiologist, Dr. KoNehemiah Massedtomorrow.  Will defer setting up study to his cardiologist. 5.  Discontinue Pomalyst to allow start of all 3 drugs next week in sequence. 6.  Rx:  Valacyclovir 500 mg a day. 7.  RTC on 12/01/2015 for MD assessment, labs (CBC with diff, CMP, Mg), review of MUGA, and initiation of Kyprolis.   MeLequita AsalMD  11/24/2015, 11:25 AM

## 2015-11-25 ENCOUNTER — Encounter: Payer: Self-pay | Admitting: Hematology and Oncology

## 2015-11-25 LAB — PROTEIN ELECTROPHORESIS, SERUM
A/G RATIO SPE: 1.1 (ref 0.7–1.7)
ALBUMIN ELP: 3.3 g/dL (ref 2.9–4.4)
ALPHA-1-GLOBULIN: 0.1 g/dL (ref 0.0–0.4)
ALPHA-2-GLOBULIN: 0.8 g/dL (ref 0.4–1.0)
Beta Globulin: 1.9 g/dL — ABNORMAL HIGH (ref 0.7–1.3)
GLOBULIN, TOTAL: 3 g/dL (ref 2.2–3.9)
Gamma Globulin: 0.2 g/dL — ABNORMAL LOW (ref 0.4–1.8)
M-Spike, %: 1.2 g/dL — ABNORMAL HIGH
TOTAL PROTEIN ELP: 6.3 g/dL (ref 6.0–8.5)

## 2015-11-25 LAB — KAPPA/LAMBDA LIGHT CHAINS
KAPPA FREE LGHT CHN: 668.77 mg/L — AB (ref 3.30–19.40)
KAPPA, LAMDA LIGHT CHAIN RATIO: 115.9 — AB (ref 0.26–1.65)
LAMDA FREE LIGHT CHAINS: 5.77 mg/L (ref 5.71–26.30)

## 2015-11-26 ENCOUNTER — Telehealth: Payer: Self-pay

## 2015-11-29 ENCOUNTER — Telehealth: Payer: Self-pay | Admitting: *Deleted

## 2015-11-29 DIAGNOSIS — C9000 Multiple myeloma not having achieved remission: Secondary | ICD-10-CM

## 2015-11-29 DIAGNOSIS — Z5111 Encounter for antineoplastic chemotherapy: Secondary | ICD-10-CM

## 2015-11-29 NOTE — Telephone Encounter (Signed)
Per MD note, MUGA scan is needed and Dr Grayland Ormond ok'd ordering it

## 2015-11-29 NOTE — Telephone Encounter (Signed)
His cardiologist did not order an ECHO as thought and he is to restart Kyprolis and Pomalyst Wednesday. He states he must have ECHO before starting this, He needs someone to order this to be done before Wednesday

## 2015-11-30 ENCOUNTER — Other Ambulatory Visit: Payer: Self-pay | Admitting: *Deleted

## 2015-11-30 ENCOUNTER — Encounter
Admission: RE | Admit: 2015-11-30 | Discharge: 2015-11-30 | Disposition: A | Discharge status: Home / Self Care | Payer: Medicare Other | Source: Ambulatory Visit | Attending: Oncology | Admitting: Oncology

## 2015-11-30 DIAGNOSIS — C9000 Multiple myeloma not having achieved remission: Secondary | ICD-10-CM | POA: Insufficient documentation

## 2015-11-30 DIAGNOSIS — R9430 Abnormal result of cardiovascular function study, unspecified: Secondary | ICD-10-CM | POA: Insufficient documentation

## 2015-11-30 DIAGNOSIS — Z5111 Encounter for antineoplastic chemotherapy: Secondary | ICD-10-CM | POA: Diagnosis not present

## 2015-11-30 MED ORDER — TECHNETIUM TC 99M-LABELED RED BLOOD CELLS IV KIT
20.0300 | PACK | Freq: Once | INTRAVENOUS | Status: AC | PRN
  Administered 2015-11-30: 20.03 via INTRAVENOUS

## 2015-12-01 ENCOUNTER — Inpatient Hospital Stay: Payer: Medicare Other

## 2015-12-01 ENCOUNTER — Ambulatory Visit: Payer: Medicare Other

## 2015-12-01 ENCOUNTER — Other Ambulatory Visit: Payer: Self-pay

## 2015-12-01 ENCOUNTER — Ambulatory Visit (HOSPITAL_BASED_OUTPATIENT_CLINIC_OR_DEPARTMENT_OTHER): Payer: Medicare Other | Admitting: Hematology and Oncology

## 2015-12-01 VITALS — BP 130/72 | HR 95 | Temp 98.0°F | Resp 20 | Wt 163.4 lb

## 2015-12-01 DIAGNOSIS — IMO0002 Reserved for concepts with insufficient information to code with codable children: Secondary | ICD-10-CM

## 2015-12-01 DIAGNOSIS — C9000 Multiple myeloma not having achieved remission: Secondary | ICD-10-CM

## 2015-12-01 DIAGNOSIS — M549 Dorsalgia, unspecified: Secondary | ICD-10-CM

## 2015-12-01 DIAGNOSIS — G629 Polyneuropathy, unspecified: Secondary | ICD-10-CM

## 2015-12-01 DIAGNOSIS — R197 Diarrhea, unspecified: Secondary | ICD-10-CM

## 2015-12-01 DIAGNOSIS — C9002 Multiple myeloma in relapse: Secondary | ICD-10-CM | POA: Diagnosis not present

## 2015-12-01 DIAGNOSIS — M87180 Osteonecrosis due to drugs, jaw: Secondary | ICD-10-CM | POA: Diagnosis not present

## 2015-12-01 DIAGNOSIS — T50995S Adverse effect of other drugs, medicaments and biological substances, sequela: Secondary | ICD-10-CM

## 2015-12-01 DIAGNOSIS — Z9484 Stem cells transplant status: Secondary | ICD-10-CM

## 2015-12-01 DIAGNOSIS — E876 Hypokalemia: Secondary | ICD-10-CM

## 2015-12-01 DIAGNOSIS — R9430 Abnormal result of cardiovascular function study, unspecified: Secondary | ICD-10-CM

## 2015-12-01 DIAGNOSIS — G8929 Other chronic pain: Secondary | ICD-10-CM

## 2015-12-01 DIAGNOSIS — I4891 Unspecified atrial fibrillation: Secondary | ICD-10-CM

## 2015-12-01 DIAGNOSIS — Z9221 Personal history of antineoplastic chemotherapy: Secondary | ICD-10-CM

## 2015-12-01 LAB — CBC WITH DIFFERENTIAL/PLATELET
Basophils Absolute: 0.1 10*3/uL (ref 0–0.1)
Basophils Relative: 2 %
Eosinophils Absolute: 0.3 10*3/uL (ref 0–0.7)
Eosinophils Relative: 4 %
HCT: 32.4 % — ABNORMAL LOW (ref 40.0–52.0)
Hemoglobin: 10.8 g/dL — ABNORMAL LOW (ref 13.0–18.0)
Lymphocytes Relative: 8 %
Lymphs Abs: 0.6 10*3/uL — ABNORMAL LOW (ref 1.0–3.6)
MCH: 34.4 pg — ABNORMAL HIGH (ref 26.0–34.0)
MCHC: 33.3 g/dL (ref 32.0–36.0)
MCV: 103.2 fL — ABNORMAL HIGH (ref 80.0–100.0)
Monocytes Absolute: 1.6 10*3/uL — ABNORMAL HIGH (ref 0.2–1.0)
Monocytes Relative: 21 %
Neutro Abs: 4.9 10*3/uL (ref 1.4–6.5)
Neutrophils Relative %: 65 %
Platelets: 215 10*3/uL (ref 150–440)
RBC: 3.14 MIL/uL — ABNORMAL LOW (ref 4.40–5.90)
RDW: 18.5 % — ABNORMAL HIGH (ref 11.5–14.5)
WBC: 7.6 10*3/uL (ref 3.8–10.6)

## 2015-12-01 LAB — COMPREHENSIVE METABOLIC PANEL
ALT: 20 U/L (ref 17–63)
AST: 19 U/L (ref 15–41)
Albumin: 3.3 g/dL — ABNORMAL LOW (ref 3.5–5.0)
Alkaline Phosphatase: 56 U/L (ref 38–126)
Anion gap: 6 (ref 5–15)
BUN: 23 mg/dL — ABNORMAL HIGH (ref 6–20)
CO2: 24 mmol/L (ref 22–32)
Calcium: 9.1 mg/dL (ref 8.9–10.3)
Chloride: 106 mmol/L (ref 101–111)
Creatinine, Ser: 1.26 mg/dL — ABNORMAL HIGH (ref 0.61–1.24)
GFR calc Af Amer: >60 mL/min (ref >60)
GFR calc non Af Amer: 57 mL/min — ABNORMAL LOW (ref >60)
Glucose, Bld: 128 mg/dL — ABNORMAL HIGH (ref 65–99)
Potassium: 4.3 mmol/L (ref 3.5–5.1)
Sodium: 136 mmol/L (ref 135–145)
Total Bilirubin: 0.6 mg/dL (ref 0.3–1.2)
Total Protein: 7 g/dL (ref 6.5–8.1)

## 2015-12-01 LAB — MAGNESIUM: Magnesium: 1.6 mg/dL — ABNORMAL LOW (ref 1.7–2.4)

## 2015-12-01 MED ORDER — SODIUM CHLORIDE 0.9 % IV SOLN
Freq: Once | INTRAVENOUS | Status: AC
  Administered 2015-12-01: 10:00:00 via INTRAVENOUS
  Filled 2015-12-01: qty 1000

## 2015-12-01 MED ORDER — DEXAMETHASONE 4 MG PO TABS: ORAL_TABLET | ORAL | Status: DC

## 2015-12-01 MED ORDER — MAGNESIUM SULFATE 2 GM/50ML IV SOLN
2.0000 g | Freq: Once | INTRAVENOUS | Status: AC
  Administered 2015-12-01: 2 g via INTRAVENOUS
  Filled 2015-12-01: qty 50

## 2015-12-01 MED ORDER — MONTELUKAST SODIUM 10 MG PO TABS: ORAL_TABLET | ORAL | Status: DC

## 2015-12-01 NOTE — Patient Instructions (Signed)
Daratumumab injection  What is this medicine?  DARATUMUMAB (dar a toom ue mab) is a monoclonal antibody. It is used to treat multiple myeloma.  This medicine may be used for other purposes; ask your health care provider or pharmacist if you have questions.  What should I tell my health care provider before I take this medicine?  They need to know if you have any of these conditions:  -infection (especially a virus infection such as chickenpox, cold sores, or herpes)  -lung or breathing disease  -pregnant or trying to get pregnant  -breast-feeding  -an unusual or allergic reaction to daratumumab, other medicines, foods, dyes, or preservatives  How should I use this medicine?  This medicine is for infusion into a vein. It is given by a health care professional in a hospital or clinic setting.  Talk to your pediatrician regarding the use of this medicine in children. Special care may be needed.  Overdosage: If you think you have taken too much of this medicine contact a poison control center or emergency room at once.  NOTE: This medicine is only for you. Do not share this medicine with others.  What if I miss a dose?  Keep appointments for follow-up doses as directed. It is important not to miss your dose. Call your doctor or health care professional if you are unable to keep an appointment.  What may interact with this medicine?  Interactions have not been studied.  Give your health care provider a list of all the medicines, herbs, non-prescription drugs, or dietary supplements you use. Also tell them if you smoke, drink alcohol, or use illegal drugs. Some items may interact with your medicine.  This list may not describe all possible interactions. Give your health care provider a list of all the medicines, herbs, non-prescription drugs, or dietary supplements you use. Also tell them if you smoke, drink alcohol, or use illegal drugs. Some items may interact with your medicine.  What should I watch for while using  this medicine?  This drug may make you feel generally unwell. Report any side effects. Continue your course of treatment even though you feel ill unless your doctor tells you to stop.  This medicine can cause serious allergic reactions. To reduce your risk you may need to take medicine before treatment with this medicine. Take your medicine as directed.  This medicine can affect the results of blood tests to match your blood type. These changes can last for up to 6 months after the final dose. Your healthcare provider will do blood tests to match your blood type before you start treatment. Tell all of your healthcare providers that you are being treated with this medicine before receiving a blood transfusion.  This medicine can affect the results of some tests used to determine treatment response; extra tests may be needed to evaluate response.  Do not become pregnant while taking this medicine or for 3 months after stopping it. Women should inform their doctor if they wish to become pregnant or think they might be pregnant. There is a potential for serious side effects to an unborn child. Talk to your health care professional or pharmacist for more information.  What side effects may I notice from receiving this medicine?  Side effects that you should report to your doctor or health care professional as soon as possible:  -allergic reactions like skin rash, itching or hives, swelling of the face, lips, or tongue  -breathing problems  -chills  -cough  -dizziness  -  back pain -fever -joint pain -loss of appetite -tiredness This list may not describe all possible side effects. Call your doctor for medical advice about side effects. You may report side effects to FDA at  1-800-FDA-1088. Where should I keep my medicine? Keep out of the reach of children. This drug is given in a hospital or clinic and will not be stored at home. NOTE: This sheet is a summary. It may not cover all possible information. If you have questions about this medicine, talk to your doctor, pharmacist, or health care provider.    2016, [employer]/Gold Standard. (2014-11-24 17:02:23)

## 2015-12-01 NOTE — Progress Notes (Signed)
[facility] day:  12/01/2015  Chief Complaint: Logan Perez is a 70 y.o. 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  HPI: The patient was last seen in the medical oncology clinic on 11/10/2015.  At that time, decision was made to add Carfilzomib (Kyprolis) to his treatment.  He has met with Dr. Adriana Simas from medical oncology at [facility] and Dr. Renetta Chalk from [facility] transplant service.  His M spike had increased from 0.5 gm/dL to 1.3 gm/dL.  He underwent MUGA on 11/30/2015.  Ejection fraction was 46%.  There were no focal wall motion abnormalities.  He states that he had a stress echo less than 1 year ago.  He has a history of PVCs and atrial fibrillation.  He takes Cardizem prn.  He feels good.  He denies any cardiac symptoms.  He denies any bone pain.  He denies any interval infections.  Past Medical History  Diagnosis Date  . Pulmonary embolism ([place])   . Stroke ([place])   . GERD (gastroesophageal reflux disease)   . Hypertension   . Atrial fibrillation ([place])   . HLD (hyperlipidemia)   . Multiple myeloma ([place])   . Elevated PSA   . BPH (benign prostatic hyperplasia)   . Difficulty voiding   . Anxiety     Past Surgical History  Procedure Laterality Date  . Back surgery  1994  . Knee arthroscopy Left 1992  . Pain pump implantation  2012  . Stem cell implant  2008    [facility]    Family History  Problem Relation Age of Onset  . Cancer Father     throat  . Bladder Cancer Neg Hx   . Kidney disease Sister   . Prostate cancer Neg Hx   . Stroke      Social History:  reports that he has quit smoking. His smoking use included Cigarettes. He has a 30 pack-year smoking history. He does not have any smokeless tobacco history on file. He reports that he does not drink alcohol or use illicit drugs.  The patient is  accompanied by his wife, Logan Perez, today.  Allergies:  Allergies  Allergen Reactions  . Azithromycin Diarrhea    Possible cause of C. Diff  . Zometa [Zoledronic Acid] Other (See Comments)    Osteonecrosis of the jaw  . Rivaroxaban Rash    Current Medications: Current Outpatient Prescriptions  Medication Sig Dispense Refill  . ALPRAZolam (XANAX) 0.25 MG tablet TAKE 1 TABLET BY MOUTH EVERY NIGHT AT BEDTIME AS NEEDED FOR ANXIETY. 30 tablet 0  . atorvastatin (LIPITOR) 10 MG tablet Take by mouth daily at 6 PM.     . baclofen (LIORESAL) 10 MG tablet Take 1 tablet (10 mg total) by mouth at bedtime. 30 each 3  . calcium citrate-vitamin D (CITRACAL+D) 315-200 MG-UNIT per tablet Take 1 tablet by mouth daily.     . cetirizine (ZYRTEC) 10 MG tablet Take 10 mg by mouth daily.     . Cholecalciferol (VITAMIN D3) 2000 UNITS capsule Take 2,000 Units by mouth daily.     . Cyanocobalamin (RA VITAMIN B-12 TR) 1000 MCG TBCR Take 1,000 mcg by mouth daily.     [name] diltiazem (CARDIZEM CD) 120 MG 24 hr capsule TAKE 1 CAPSULE ONCE DAILY    . diltiazem (CARDIZEM) 60 MG tablet Take 60 mg by mouth. Take 1  pill PRN HR greater then 120    . ELIQUIS 5 MG TABS tablet TK 1 T PO TWICE DAILY  3  . ibuprofen (ADVIL,MOTRIN) 200 MG tablet Take 200 mg by mouth every 6 (six) hours as needed for mild pain.     [name] ketoconazole (NIZORAL) 2 % cream Apply topically.    [name] loperamide (IMODIUM) 2 MG capsule Take 2 mg by mouth as needed for diarrhea or loose stools.     [name] omeprazole (PRILOSEC) 20 MG capsule Take 20 mg by mouth daily.     . potassium chloride SA (K-DUR,KLOR-CON) 20 MEQ tablet TAKE 1 TABLET(20 MEQ) BY MOUTH TWICE DAILY 180 tablet 0  . Probiotic Product ([place]) CAPS Take 1 capsule by mouth daily.    . ranitidine (ZANTAC) 150 MG tablet Take by mouth.    . tamsulosin (FLOMAX) 0.4 MG CAPS capsule Take 1 capsule (0.4 mg total) by mouth daily. 90 capsule 3  . valACYclovir (VALTREX) 500 MG tablet Take 1 tablet  (500 mg total) by mouth daily. 30 tablet 3  . dexamethasone (DECADRON) 4 MG tablet Take 16 mg (four 4 pills) on the day of treatment and 4 mg (1 pill) the day after treatment 20 tablet 2  . montelukast (SINGULAIR) 10 MG tablet Take once a day as directed by MD (per treatment schedule). 30 tablet 1  . [START ON 12/08/2015] PAIN MANAGEMENT IT PUMP REFILL 1 each by Intrathecal route once. Medication: PF Fentanyl 1,500.0 mcg/ml PF Bupivicaine 30.0 mg/ml PF Clonidine 300.25mg/ml Total Volume: 40 ml Needed by 12-08-15 '@1400'  1 each 0  . pomalidomide (POMALYST) 4 MG capsule Take 1 capsule (4 mg total) by mouth daily. Take with water on days 1-21. Repeat every 28 days. (Patient not taking: Reported on 12/01/2015) 21 capsule 0   No current facility-administered medications for this visit.    Review of Systems:  GENERAL:  Energy level is fair.  No fevers or sweats .  Weight stable. PERFORMANCE STATUS (ECOG): 1 HEENT:  Cataracts. No runny nose, sore throat, mouth sores or tenderness. Lungs: No shortness of breath or cough.  No hemoptysis. Cardiac:  No chest pain, palpitations, orthopnea, or PND. GI:  Diarrhea, improved.  Appetite good.  No nausea, vomiting, diarrhea, constipation, melena or hematochezia. GU:  No urgency, frequency, dysuria, or hematuria. Musculoskeletal:  Compression fracture of back on a pain pump (chronic).  Back pain is 4 out of 10.  No joint pain.  No muscle tenderness. Extremities:  No pain or swelling. Skin:  Fragile skin.  No rashes or skin changes. Neuro:  Little tremor, getting worse over the years.  Neuropathy in feet (see HPI).  No headache, weakness, balance or coordination issues. Endocrine:  No diabetes, thyroid issues, hot flashes or night sweats. Psych:  No mood changes, depression or anxiety. Pain:  Chronic back pain (see above). Review of systems:  All other systems reviewed and found to be negative.   Physical Exam: Blood pressure 130/72, pulse 95, temperature 98 F  (36.7 C), temperature source Oral, resp. rate 20, weight 163 lb 5.8 oz (74.1 kg), SpO2 98 %. GENERAL:  Well developed, well nourished, sitting comfortably in the exam room in no acute distress. MENTAL STATUS:  Alert and oriented to person, place and time. HEAD:  GPearline Cablesshort hair with goatee.  Normocephalic, atraumatic, face symmetric, no Cushingoid features. EYES:  Glasses.  Blue eyes.  Pupils equal round and reactive to light and accomodation.  No conjunctivitis or scleral icterus. ENT:  Oropharynx clear without lesion.  Hearing aide.  Tongue normal. Mucous membranes moist.  RESPIRATORY:  Clear to auscultation without rales, wheezes or rhonchi. CARDIOVASCULAR:  Regular rate and rhythm without murmur, rub or gallop. ABDOMEN:  RUQ pain pump.  Soft, non-tender, with active bowel sounds, and no hepatosplenomegaly.  No masses. SKIN:  No rashes, ulcers or lesions. EXTREMITIES: No edema, no skin discoloration or tenderness.  No palpable cords. LYMPH NODES: No palpable cervical, supraclavicular, axillary or inguinal adenopathy  NEUROLOGICAL: Intention tremor. PSYCH:  Appropriate.   Appointment on 12/01/2015  Component Date Value Ref Range Status  . WBC 12/01/2015 7.6  3.8 - 10.6 K/uL Final  . RBC 12/01/2015 3.14* 4.40 - 5.90 MIL/uL Final  . Hemoglobin 12/01/2015 10.8* 13.0 - 18.0 g/dL Final  . HCT 12/01/2015 32.4* 40.0 - 52.0 % Final  . MCV 12/01/2015 103.2* 80.0 - 100.0 fL Final  . MCH 12/01/2015 34.4* 26.0 - 34.0 pg Final  . MCHC 12/01/2015 33.3  32.0 - 36.0 g/dL Final  . RDW 12/01/2015 18.5* 11.5 - 14.5 % Final  . Platelets 12/01/2015 215  150 - 440 K/uL Final  . Neutrophils Relative % 12/01/2015 65   Final  . Neutro Abs 12/01/2015 4.9  1.4 - 6.5 K/uL Final  . Lymphocytes Relative 12/01/2015 8   Final  . Lymphs Abs 12/01/2015 0.6* 1.0 - 3.6 K/uL Final  . Monocytes Relative 12/01/2015 21   Final  . Monocytes Absolute 12/01/2015 1.6* 0.2 - 1.0 K/uL Final  . Eosinophils Relative 12/01/2015 4    Final  . Eosinophils Absolute 12/01/2015 0.3  0 - 0.7 K/uL Final  . Basophils Relative 12/01/2015 2   Final  . Basophils Absolute 12/01/2015 0.1  0 - 0.1 K/uL Final  . Sodium 12/01/2015 136  135 - 145 mmol/L Final  . Potassium 12/01/2015 4.3  3.5 - 5.1 mmol/L Final  . Chloride 12/01/2015 106  101 - 111 mmol/L Final  . CO2 12/01/2015 24  22 - 32 mmol/L Final  . Glucose, Bld 12/01/2015 128* 65 - 99 mg/dL Final  . BUN 12/01/2015 23* 6 - 20 mg/dL Final  . Creatinine, Ser 12/01/2015 1.26* 0.61 - 1.24 mg/dL Final  . Calcium 12/01/2015 9.1  8.9 - 10.3 mg/dL Final  . Total Protein 12/01/2015 7.0  6.5 - 8.1 g/dL Final  . Albumin 12/01/2015 3.3* 3.5 - 5.0 g/dL Final  . AST 12/01/2015 19  15 - 41 U/L Final  . ALT 12/01/2015 20  17 - 63 U/L Final  . Alkaline Phosphatase 12/01/2015 56  38 - 126 U/L Final  . Total Bilirubin 12/01/2015 0.6  0.3 - 1.2 mg/dL Final  . GFR calc non Af Amer 12/01/2015 57* >60 mL/min Final  . GFR calc Af Amer 12/01/2015 >60  >60 mL/min Final   Comment: (NOTE) The eGFR has been calculated using the CKD EPI equation. This calculation has not been validated in all clinical situations. eGFR's persistently <60 mL/min signify possible Chronic Kidney Disease.   . Anion gap 12/01/2015 6  5 - 15 Final  . Magnesium 12/01/2015 1.6* 1.7 - 2.4 mg/dL Final    Assessment:  Logan Perez is a 70 y.o. male with stage III mutiple myeloma.  He initially presented with progressive back pain beginning in 12/2006.  MRI revealed "spots and compression fractures".  He began Velcade, thalidomide, and Decadron.  In 08/2007, he underwent high dose chemotherapy and autologous stem cell transplant.  He recurred with a rising M-spike (2.7) with repeat M  spike (1.7 gm/dl) in 03/2010.  He was initially treated with Velcade (02/08/2010 - 05/10/2010).  He then began Revlimid (15 mg 3 weeks on/1 week off) and Decadron (40 mg on day 1, 8, 15, 22).  Because of significant side effect with Decadron  his dose was decreased to 10 mg once a week in 07/2010.    He was on maintenance Revlimid. Revlimid was initially10 mg 3 weeks on/1 week off. This was changed to 10 mg 2 weeks on/2 weeks off secondary to right nipple tenderness. His dose was increased to 10 mg 3 weeks on/1 week off with Decadron 10 mg a week (on Sundays) and then Revlamid 15 mg 3 weeks on and 1 week off with Decadron on Sundays.  He began Pomalyst 4 mg 3 weeks on/1 week off with Decadron on 08/27/2015.  Over the past year his SPEP has revealed no monoclonal protein (04/21/2015) and 0.5 gm/dL on 09/22/2015 and 10/20/2015. Free light chains have been monitored. Kappa free light chains were 18.54 on 11/21/2013, 18.37 on 02/20/2014, 18.93 on 05/22/2014, 32.58 (high; normal ratio 1.27) on 08/21/2014, 51.53 (high; elevated ratio of 2.12) on 11/13/2014, 28.08 (ratio 1.73) on 12/09/2014, 23.71 (ratio 2.17) on 01/18/2015, 92.93 (ratio 9.49) on 04/21/2015, 93.44 (ratio 10.28) on 05/26/2015, 255.45 (ratio of 24.05) on 07/14/2015, 373.89 (ratio 48.31) on 08/04/2015, 474.33 (ratio 70.58) on 08/25/2015, 450.76 (ratio 55.44) on 09/22/2015, and 453.4 (ratio 59.89) on 10/20/2015.   Bone survey on 12/08/2014 was stable.  Bone survey on 10/21/2015 revealed increase conspicuity of subcentimeter lytic lesions in the calvarium.  Bone marrow aspirate and biopsy on 11/04/2015 revealed an atypical monoclonal plasma cells estimated at 30-40% of marrow cells.   Marrow was variably cellular (approximately 45%) with background trilineage hematopoiesis. There was no significant increase in marrow reticulin fibers. Storage iron was present.    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 He develoed herpes zoster in 04/2008. He developed a pulmonary embolism in 05/2013. He was initially on Xarelto, but is now on Eliquis. He had an episode of pneumonia around this time requiring a brief admission. He  developed severe lower leg cramps on 08/07/2014 secondary to hypokalemia. Duplex was negative.   He was treated for C difficile colitis (Flagyl completed 07/30/2015).  He has a chronic indwelling pain pump.  He received 4 cycles of Pomalyst and Decadron (08/27/2015 -11/19/2015).  Restaging studies document progressive disease.  Kappa free light chains are increasing.  SPEP revealed 0.5 gm/dL monoclonal protein then 1.3 gm/dL.  Bone survey reveals increase conspicuity of subcentimeter lytic lesions in the calvarium.  Bone marrow reveals 30-40% plasma cells.   MUGA on 11/30/2015 revealed an ejection fraction of 46%.  There were no focal wall motion abnormalities.  He had a stress echo less than 1 year ago.  He has a history of PVCs and atrial fibrillation.  He takes Cardizem prn.  He has met with the bone marrow transplant team at [facility] with the plan for salvage chemotherapy and consideration of a second autologous stem cell transplant.  He has frozen stem cells from his first transplant.  Symptomatically, he has minimal diarrhea.  He denies any cardiac symptoms.  Exam is stable.  He has mild hypomagnesemia.  Plan:  1.  Labs today:  CBC with diff, CMP, Mg. 2.  Review interval MUGA scan and decision not to proceed with Kyprolis. 3.  Discuss alternative therapy with daratumumab (Darzalex).  Discuss side effects associated with therapy including  allergic reactions and need for premedications.  Information provided.  Discuss unclear if EF will preclude patient from transplant.  Anticipate follow-up echo/MUGA with restaging once response to salvage therapy documented.  Discuss with Dr. Evelene Croon. 4.  Patient to follow-up with cardiologist. 5.  Magnesium sulfate 2 gm IV today. 6.  Continue valacyclovir 500 mg a day. 7.  Preauth daratumumab. 8.  RTC in 1 week for MD assessment, labs (CBC with diff, CMP, Mg), and week #1 daratumumab (Darzalex).   Lequita Asal, MD  12/01/2015, 9:29 AM

## 2015-12-01 NOTE — Progress Notes (Signed)
Pt here today for possible initiation of chemo. He had a MUGA scan which showed EF 46%.  He states his diarrhea is better. He only has an episode every 3 or 4 days otherwise stools are formed. Appetite is improved. Energy is fair. Back pain today is rated a 4/10. Takes ibuprofen prn severe pain. He has an implanted pain pump.

## 2015-12-02 ENCOUNTER — Other Ambulatory Visit: Payer: Self-pay

## 2015-12-02 ENCOUNTER — Ambulatory Visit: Payer: Medicare Other

## 2015-12-02 MED ORDER — PAIN MANAGEMENT IT PUMP REFILL: 1.0000 | Freq: Once | INTRATHECAL | Status: DC

## 2015-12-05 ENCOUNTER — Encounter: Payer: Self-pay | Admitting: Hematology and Oncology

## 2015-12-08 ENCOUNTER — Other Ambulatory Visit: Payer: Self-pay | Admitting: Hematology and Oncology

## 2015-12-08 ENCOUNTER — Ambulatory Visit: Payer: Medicare Other | Attending: Pain Medicine | Admitting: Pain Medicine

## 2015-12-08 ENCOUNTER — Telehealth: Payer: Self-pay

## 2015-12-08 ENCOUNTER — Encounter: Payer: Self-pay | Admitting: Pain Medicine

## 2015-12-08 VITALS — BP 136/95 | HR 99 | Temp 97.9°F | Resp 16 | Ht 67.0 in | Wt 165.0 lb

## 2015-12-08 DIAGNOSIS — Z79891 Long term (current) use of opiate analgesic: Secondary | ICD-10-CM

## 2015-12-08 DIAGNOSIS — Z7901 Long term (current) use of anticoagulants: Secondary | ICD-10-CM | POA: Diagnosis not present

## 2015-12-08 DIAGNOSIS — K219 Gastro-esophageal reflux disease without esophagitis: Secondary | ICD-10-CM | POA: Diagnosis not present

## 2015-12-08 DIAGNOSIS — M791 Myalgia: Secondary | ICD-10-CM | POA: Diagnosis not present

## 2015-12-08 DIAGNOSIS — C9002 Multiple myeloma in relapse: Secondary | ICD-10-CM | POA: Insufficient documentation

## 2015-12-08 DIAGNOSIS — M4854XS Collapsed vertebra, not elsewhere classified, thoracic region, sequela of fracture: Secondary | ICD-10-CM | POA: Diagnosis not present

## 2015-12-08 DIAGNOSIS — N401 Enlarged prostate with lower urinary tract symptoms: Secondary | ICD-10-CM | POA: Diagnosis not present

## 2015-12-08 DIAGNOSIS — E7801 Familial hypercholesterolemia: Secondary | ICD-10-CM | POA: Insufficient documentation

## 2015-12-08 DIAGNOSIS — Z86711 Personal history of pulmonary embolism: Secondary | ICD-10-CM | POA: Insufficient documentation

## 2015-12-08 DIAGNOSIS — G894 Chronic pain syndrome: Secondary | ICD-10-CM | POA: Diagnosis not present

## 2015-12-08 DIAGNOSIS — M549 Dorsalgia, unspecified: Secondary | ICD-10-CM | POA: Insufficient documentation

## 2015-12-08 DIAGNOSIS — R197 Diarrhea, unspecified: Secondary | ICD-10-CM | POA: Insufficient documentation

## 2015-12-08 DIAGNOSIS — I10 Essential (primary) hypertension: Secondary | ICD-10-CM | POA: Insufficient documentation

## 2015-12-08 DIAGNOSIS — Z451 Encounter for adjustment and management of infusion pump: Secondary | ICD-10-CM

## 2015-12-08 DIAGNOSIS — S22080S Wedge compression fracture of T11-T12 vertebra, sequela: Secondary | ICD-10-CM

## 2015-12-08 DIAGNOSIS — I4891 Unspecified atrial fibrillation: Secondary | ICD-10-CM | POA: Insufficient documentation

## 2015-12-08 DIAGNOSIS — G893 Neoplasm related pain (acute) (chronic): Secondary | ICD-10-CM | POA: Insufficient documentation

## 2015-12-08 DIAGNOSIS — F1120 Opioid dependence, uncomplicated: Secondary | ICD-10-CM | POA: Diagnosis not present

## 2015-12-08 DIAGNOSIS — Z9689 Presence of other specified functional implants: Secondary | ICD-10-CM | POA: Insufficient documentation

## 2015-12-08 NOTE — Telephone Encounter (Signed)
Called pt left VM per MD for pt to take singulair today and decadron 16mg  before clinic tomorrow.  Pt did answer and asked for a return phone call for confirmation.  As of 1620 no return call.  MD aware.

## 2015-12-08 NOTE — Progress Notes (Signed)
Patient's Name: Logan Perez MRN: [id number] DOB: Mar 07, 1946 DOS: 12/08/2015  Primary Reason(s) for Visit: Intrathecal Pump Refill. CC: Back Pain   Pre-Procedure Assessment: Logan Perez is a 70 y.o. year old, male patient, seen today for interventional treatment. He has BPH with obstruction/lower urinary tract symptoms; Multiple myeloma ([place]); Diarrhea; Clostridium difficile diarrhea; Essential (primary) hypertension; Acid reflux; Combined fat and carbohydrate induced hyperlipemia; MI (mitral incompetence); TI (tricuspid incompetence); Paroxysmal atrial fibrillation ([place]); Pulmonary embolism ([place]); Breathlessness on exertion; Chronic pain; Presence of intrathecal pump; Long term current use of opiate analgesic; Long term prescription opiate use; Opiate use; Opiate dependence ([place]); Encounter for therapeutic drug level monitoring; Encounter for chronic pain management; Encounter for adjustment or management of infusion pump; Night muscle spasms; Muscle spasticity; Neuropathic pain; Neurogenic pain; Chronic low back pain; Lumbar spondylosis; Compression fracture of T12 vertebra (HCC) (70-75% magnitude) (with mild retropulsion); Diffuse myofascial pain syndrome; Presence of implanted infusion pump (Medtronic, programmable, intrathecal pump); Cancer associated pain; Encounter for interrogation of infusion pump; Chronic lumbar radicular pain; Opiate analgesic contract exists; Chronic pain syndrome; Chronic anticoagulation (Eliquis); Hypomagnesemia; and Ejection fraction < 50% on his problem list.. His primarily concern today is the Back Pain   Note: It will never ceases to amaze me how the insurance company's continued to abuse patients. This patient is a 70 year old pleasant, mild-mannered individual unfortunate enough to have cancer (multiple myeloma) with metastases to the spine that have cause compression of vertebral bodies with the collapse of the bone wall into the canal and chronic pain. He  suffers from chronic debilitating pain in the lower back and legs and now the insurance company does not want to pay for the medications that he has been getting over the years in his intrathecal pump to be able to control the pain. In other words, they are attempting to inflate pain and torture and individual with a terminal condition. There excuse is that they think that the medications that were given him are being administered at home. This is not the case, this has not been the case, and this will never be the case. These people need to get their facts straight, their act together, and learn how to be humane and caring, instead of obstructive and cruel.   Verification of the correct person, correct site (including marking of site), and correct procedure were performed and confirmed by the patient. Today's Vitals   12/08/15 1406 12/08/15 1410  BP: 136/95   Pulse: 99   Temp: 97.9 F (36.6 C)   Resp: 16   Height: _0  (1.702 m)   Weight: 165 lb (74.844 kg)   SpO2: 100%   PainSc: 3  3   PainLoc: Back   Calculated BMI: Body mass index is 25.84 kg/(m^2). Allergies: He is allergic to azithromycin; zometa; and rivaroxaban.. Primary Diagnosis: Chronic pain syndrome [G89.4]  Today's Pain Score: 3  Reported level of pain is incompatible with clinical obrservations. This may be secondary to a possible lack of understanding on how the pain scale works. Pain Type: Chronic pain Pain Location: Back Pain Orientation: Lower Pain Descriptors / Indicators: Constant Pain Frequency: Constant  Date of Last Visit: Date of Last Visit: 08/26/15 Service Provided on Last Visit: Service Provided on Last Visit: Procedure (pump refill)  Pharmacotherapy Review:   Side-effects or Adverse reactions: None reported. Effectiveness: Described as relatively effective, allowing for increase in activities of daily living (ADL). Onset of action: Within expected pharmacological parameters. Duration of action: Within  normal limits for medication.  Peak effect: Timing and results are as within normal expected parameters. [place] PMP: Compliant with practice rules and regulations. Last UDS available in the system: No results found for: LABOPIA, COCAINSCRNUR, LABBENZ, AMPHETMU, THCU, LABBARB  No components found for: DRUGS OF ABUSE SCREEN W/O ALC DST: Compliant with practice rules and regulations. Lab work: No new labs ordered by our practice. Treatment compliance: Compliant. Substance Use Disorder (SUD) Risk Level: Low Planned course of action: Continue therapy as is.  Intrathecal Pump Therapy: Side-effects or Adverse reactions: None reported. Effectiveness: Described as relatively effective, allowing for increase in activities of daily living (ADL). Plan: No changes in programming.  Procedure: Type: Palliative Intrathecal Pump Refill by Nursing Staff Region: Lower Abdominal Area Laterality: Right-Sided  Indications: Chronic Pain  Consent: Secured. Under the influence of no sedatives a written informed consent was obtained, after having provided information on the risks and possible complications. To fulfill our ethical and legal obligations, as recommended by the American Medical Association's Code of Ethics, we have provided information to the patient about our clinical impression; the nature and purpose of the treatment or procedure; the risks, benefits, and possible complications of the intervention; alternatives; the risk(s) and benefit(s) of the alternative treatment(s) or procedure(s); and the risk(s) and benefit(s) of doing nothing. The patient was provided information about the risks and possible complications associated with the procedure. These include, but are not limited to, failure to achieve desired goals, infection, bleeding, organ or nerve damage, allergic reactions, paralysis, and death. In addition, the patient was informed that Medicine is not an exact science; therefore, there is also the  possibility of unforeseen risks and possible complications that may result in a catastrophic outcome. The patient indicated having understood very clearly. We have given the patient no guarantees and we have made no promises. Enough time was given to the patient to ask questions, all of which were answered to the patient's satisfaction.  Pre-Procedure Preparation: Safety Precautions: Allergies reviewed. Appropriate site, procedure, and patient were confirmed by following the Joint Commission's Universal Protocol (UP.01.01.01), in the form of a "Time Out". The patient was asked about blood thinners, or active infections, both of which were denied. Monitoring:  Clinical observation Infection Control Precautions: Sterile technique used. Standard Universal Precautions were taken as recommended by the Department of [facility] for Disease Control and Prevention (CDC). Standard pre-surgical skin prep was conducted. Respiratory hygiene and cough etiquette was practiced. Hand hygiene observed. Safe injection practices and needle disposal techniques followed. Medications properly checked for expiration dates and contaminants. Personal protective equipment (PPE) used: Sterile surgical gloves.  Anesthesia, Analgesia, Anxiolysis: Type: None required Local Anesthetic(s): None used. IV Access: None. Sedation: Not necessary or indicated. Indication(s): None.  Description of Procedure Process:  Time-out: "Time-out" completed before starting procedure, as per protocol. Position: Supine Target Area: For Intrathecal Pump Refill(s), the target is the central port. Approach: Anterior, 90 degree angle approach. Area Prepped: Entire Area around the pump implant. Prepping solution: ChloraPrep (2% chlorhexidine gluconate and 70% isopropyl alcohol) Safety Precautions: Aspiration looking for blood return was conducted prior to all injections. At no point did we inject any substances, as a needle was being  advanced. No attempts were made at seeking any paresthesias. Safe injection practices and needle disposal techniques used. Medications properly checked for expiration dates. SDV (single dose vial) medications used. Description of the Procedure: Protocol guidelines were followed. Two nurses trained to do implant refills were present during the entire procedure. The refill medication was checked by  both healthcare providers as well as the patient. The patient was included in the "Time-out" to verify the medication. The patient was placed in position. The pump was identified. The area was prepped in the usual manner. The sterile template was positioned over the pump, making sure the side-port location matched that of the pump. Both, the pump and the template were held for stability. The needle provided in the Medtronic Kit was then introduced thru the center of the template and into the central port. The pump content was aspirated and discarded volume documented. The new medication was slowly infused into the pump, thru the filter, making sure to avoid overpressure of the device. The needle was then removed and the area cleansed, making sure to leave some of the prepping solution back to take advantage of its long term bactericidal properties. The pump was interrogated and programmed to reflect the correct medication, volume, and dosage. The program was printed and taken to the physician for approval. Once checked and signed by the physician, a copy was provided to the patient and another scanned into the EMR. EBL: None Materials Used:  Medtronic Refill Kit  Post-procedure Assessment:  Complications: Uneventful. Disposition: Return to clinic in 3 to4 months for refill. (See printout) Comments:  No additional relevant information.  Assessment: Encounter Diagnosis:  Primary Diagnosis: Chronic pain syndrome [G89.4] Plan: Jurrell was seen today for back pain.  Diagnoses and all orders for this  visit:  Chronic pain syndrome  Cancer associated pain  Compression fracture of T12 vertebra, sequela  Multiple myeloma in relapse ([place])  Encounter for adjustment or management of infusion pump  Long term current use of opiate analgesic  Patient instructions:  Patient Instructions  Narcotic Overdose A narcotic overdose is the misuse or overuse of a narcotic drug. A narcotic overdose can make you pass out and stop breathing. If you are not treated right away, this can cause permanent brain damage or stop your heart. Medicine may be given to reverse the effects of an overdose. If so, this medicine may bring on withdrawal symptoms. The symptoms may be abdominal cramps, throwing up (vomiting), sweating, chills, and nervousness. Injecting narcotics can cause more problems than just an overdose. AIDS, hepatitis, and other very serious infections are transmitted by sharing needles and syringes. If you decide to quit using, there are medicines which can help you through the withdrawal period. Trying to quit all at once on your own can be uncomfortable, but not life-threatening. Call your caregiver, Narcotics Anonymous, or any drug and alcohol treatment program for further help.    This information is not intended to replace advice given to you by your health care provider. Make sure you discuss any questions you have with your health care provider.   Document Released: 11/02/2004 Document Revised: 10/16/2014 Document Reviewed: 03/17/2015 Elsevier Interactive Patient Education 2016 [place].     Primary Care Physician: Sherrin Daisy, MD Location: [facility] Attending Physician: Gaspar Cola, MD

## 2015-12-08 NOTE — Progress Notes (Signed)
Safety precautions to be maintained throughout the outpatient stay will include: orient to surroundings, keep bed in low position, maintain call bell within reach at all times, provide assistance with transfer out of bed and ambulation.  

## 2015-12-08 NOTE — Patient Instructions (Signed)
Narcotic Overdose °A narcotic overdose is the misuse or overuse of a narcotic drug. A narcotic overdose can make you pass out and stop breathing. If you are not treated right away, this can cause permanent brain damage or stop your heart. Medicine may be given to reverse the effects of an overdose. If so, this medicine may bring on withdrawal symptoms. The symptoms may be abdominal cramps, throwing up (vomiting), sweating, chills, and nervousness. °Injecting narcotics can cause more problems than just an overdose. AIDS, hepatitis, and other very serious infections are transmitted by sharing needles and syringes. If you decide to quit using, there are medicines which can help you through the withdrawal period. Trying to quit all at once on your own can be uncomfortable, but not life-threatening. Call your caregiver, Narcotics Anonymous, or any drug and alcohol treatment program for further help.  °  °This information is not intended to replace advice given to you by your health care provider. Make sure you discuss any questions you have with your health care provider. °  °Document Released: 11/02/2004 Document Revised: 10/16/2014 Document Reviewed: 03/17/2015 °Elsevier Interactive Patient Education ©2016 Elsevier Inc. ° °

## 2015-12-09 ENCOUNTER — Inpatient Hospital Stay: Payer: Medicare Other | Attending: Hematology and Oncology

## 2015-12-09 ENCOUNTER — Inpatient Hospital Stay: Payer: Medicare Other

## 2015-12-09 ENCOUNTER — Other Ambulatory Visit: Payer: Self-pay | Admitting: Hematology and Oncology

## 2015-12-09 ENCOUNTER — Inpatient Hospital Stay (HOSPITAL_BASED_OUTPATIENT_CLINIC_OR_DEPARTMENT_OTHER): Payer: Medicare Other | Admitting: Hematology and Oncology

## 2015-12-09 VITALS — BP 144/84 | HR 84 | Resp 18

## 2015-12-09 VITALS — BP 121/79 | HR 104 | Temp 97.9°F | Resp 18 | Ht 67.0 in | Wt 164.9 lb

## 2015-12-09 DIAGNOSIS — Z9484 Stem cells transplant status: Secondary | ICD-10-CM | POA: Insufficient documentation

## 2015-12-09 DIAGNOSIS — C9002 Multiple myeloma in relapse: Secondary | ICD-10-CM

## 2015-12-09 DIAGNOSIS — Z7901 Long term (current) use of anticoagulants: Secondary | ICD-10-CM

## 2015-12-09 DIAGNOSIS — I4891 Unspecified atrial fibrillation: Secondary | ICD-10-CM

## 2015-12-09 DIAGNOSIS — Z8739 Personal history of other diseases of the musculoskeletal system and connective tissue: Secondary | ICD-10-CM | POA: Insufficient documentation

## 2015-12-09 DIAGNOSIS — Z79899 Other long term (current) drug therapy: Secondary | ICD-10-CM | POA: Diagnosis not present

## 2015-12-09 DIAGNOSIS — R634 Abnormal weight loss: Secondary | ICD-10-CM | POA: Diagnosis not present

## 2015-12-09 DIAGNOSIS — G8929 Other chronic pain: Secondary | ICD-10-CM | POA: Insufficient documentation

## 2015-12-09 DIAGNOSIS — I10 Essential (primary) hypertension: Secondary | ICD-10-CM | POA: Insufficient documentation

## 2015-12-09 DIAGNOSIS — K219 Gastro-esophageal reflux disease without esophagitis: Secondary | ICD-10-CM | POA: Diagnosis not present

## 2015-12-09 DIAGNOSIS — I493 Ventricular premature depolarization: Secondary | ICD-10-CM | POA: Diagnosis not present

## 2015-12-09 DIAGNOSIS — N400 Enlarged prostate without lower urinary tract symptoms: Secondary | ICD-10-CM | POA: Diagnosis not present

## 2015-12-09 DIAGNOSIS — Z86711 Personal history of pulmonary embolism: Secondary | ICD-10-CM | POA: Insufficient documentation

## 2015-12-09 DIAGNOSIS — R251 Tremor, unspecified: Secondary | ICD-10-CM | POA: Insufficient documentation

## 2015-12-09 DIAGNOSIS — E785 Hyperlipidemia, unspecified: Secondary | ICD-10-CM | POA: Insufficient documentation

## 2015-12-09 DIAGNOSIS — R197 Diarrhea, unspecified: Secondary | ICD-10-CM | POA: Diagnosis not present

## 2015-12-09 DIAGNOSIS — Z87311 Personal history of (healed) other pathological fracture: Secondary | ICD-10-CM | POA: Insufficient documentation

## 2015-12-09 DIAGNOSIS — Z8673 Personal history of transient ischemic attack (TIA), and cerebral infarction without residual deficits: Secondary | ICD-10-CM | POA: Insufficient documentation

## 2015-12-09 DIAGNOSIS — Z5112 Encounter for antineoplastic immunotherapy: Secondary | ICD-10-CM | POA: Insufficient documentation

## 2015-12-09 DIAGNOSIS — F419 Anxiety disorder, unspecified: Secondary | ICD-10-CM | POA: Diagnosis not present

## 2015-12-09 DIAGNOSIS — Z888 Allergy status to other drugs, medicaments and biological substances status: Secondary | ICD-10-CM | POA: Insufficient documentation

## 2015-12-09 DIAGNOSIS — Z87891 Personal history of nicotine dependence: Secondary | ICD-10-CM | POA: Insufficient documentation

## 2015-12-09 DIAGNOSIS — M549 Dorsalgia, unspecified: Secondary | ICD-10-CM | POA: Insufficient documentation

## 2015-12-09 LAB — CBC WITH DIFFERENTIAL/PLATELET
Basophils Absolute: 0 10*3/uL (ref 0–0.1)
Basophils Relative: 1 %
Eosinophils Absolute: 0.1 10*3/uL (ref 0–0.7)
Eosinophils Relative: 5 %
HCT: 31.3 % — ABNORMAL LOW (ref 40.0–52.0)
Hemoglobin: 10.5 g/dL — ABNORMAL LOW (ref 13.0–18.0)
Lymphocytes Relative: 20 %
Lymphs Abs: 0.6 10*3/uL — ABNORMAL LOW (ref 1.0–3.6)
MCH: 34.4 pg — ABNORMAL HIGH (ref 26.0–34.0)
MCHC: 33.7 g/dL (ref 32.0–36.0)
MCV: 102.1 fL — ABNORMAL HIGH (ref 80.0–100.0)
Monocytes Absolute: 0.6 10*3/uL (ref 0.2–1.0)
Monocytes Relative: 17 %
Neutro Abs: 1.8 10*3/uL (ref 1.4–6.5)
Neutrophils Relative %: 57 %
Platelets: 199 10*3/uL (ref 150–440)
RBC: 3.06 MIL/uL — ABNORMAL LOW (ref 4.40–5.90)
RDW: 18.4 % — ABNORMAL HIGH (ref 11.5–14.5)
WBC: 3.2 10*3/uL — ABNORMAL LOW (ref 3.8–10.6)

## 2015-12-09 LAB — COMPREHENSIVE METABOLIC PANEL
ALT: 25 U/L (ref 17–63)
AST: 25 U/L (ref 15–41)
Albumin: 3.2 g/dL — ABNORMAL LOW (ref 3.5–5.0)
Alkaline Phosphatase: 52 U/L (ref 38–126)
Anion gap: 7 (ref 5–15)
BUN: 14 mg/dL (ref 6–20)
CO2: 23 mmol/L (ref 22–32)
Calcium: 9.2 mg/dL (ref 8.9–10.3)
Chloride: 103 mmol/L (ref 101–111)
Creatinine, Ser: 1.32 mg/dL — ABNORMAL HIGH (ref 0.61–1.24)
GFR calc Af Amer: >60 mL/min (ref >60)
GFR calc non Af Amer: 53 mL/min — ABNORMAL LOW (ref >60)
Glucose, Bld: 111 mg/dL — ABNORMAL HIGH (ref 65–99)
Potassium: 3.7 mmol/L (ref 3.5–5.1)
Sodium: 133 mmol/L — ABNORMAL LOW (ref 135–145)
Total Bilirubin: 0.5 mg/dL (ref 0.3–1.2)
Total Protein: 7.6 g/dL (ref 6.5–8.1)

## 2015-12-09 LAB — TYPE AND SCREEN
ABO/RH(D): B POS
Antibody Screen: NEGATIVE

## 2015-12-09 LAB — DAT, POLYSPECIFIC AHG (ARMC ONLY): Polyspecific AHG test: NEGATIVE

## 2015-12-09 LAB — ABO/RH: ABO/RH(D): B POS

## 2015-12-09 LAB — MAGNESIUM: Magnesium: 1.6 mg/dL — ABNORMAL LOW (ref 1.7–2.4)

## 2015-12-09 MED ORDER — SODIUM CHLORIDE 0.9 % IV SOLN
16.0000 mg/kg | Freq: Once | INTRAVENOUS | Status: AC
  Administered 2015-12-09: 1200 mg via INTRAVENOUS
  Filled 2015-12-09: qty 60

## 2015-12-09 MED ORDER — DARATUMUMAB CHEMO INJECTION 400 MG/20ML: 16.0000 mg/kg | Freq: Once | INTRAVENOUS | Status: DC

## 2015-12-09 MED ORDER — DIPHENHYDRAMINE HCL 25 MG PO CAPS
50.0000 mg | ORAL_CAPSULE | Freq: Once | ORAL | Status: AC
  Administered 2015-12-09: 50 mg via ORAL
  Filled 2015-12-09: qty 2

## 2015-12-09 MED ORDER — SODIUM CHLORIDE 0.9 % IV SOLN
Freq: Once | INTRAVENOUS | Status: AC
  Administered 2015-12-09: 09:00:00 via INTRAVENOUS
  Filled 2015-12-09: qty 1000

## 2015-12-09 MED ORDER — SODIUM CHLORIDE 0.9 % IV SOLN
Freq: Once | INTRAVENOUS | Status: AC
  Administered 2015-12-09: 10:00:00 via INTRAVENOUS
  Filled 2015-12-09: qty 4

## 2015-12-09 MED ORDER — ACETAMINOPHEN 325 MG PO TABS
650.0000 mg | ORAL_TABLET | Freq: Once | ORAL | Status: AC
  Administered 2015-12-09: 650 mg via ORAL
  Filled 2015-12-09: qty 2

## 2015-12-09 MED ORDER — PROCHLORPERAZINE MALEATE 10 MG PO TABS
10.0000 mg | ORAL_TABLET | Freq: Once | ORAL | Status: DC
  Administered 2015-12-09: 10 mg via ORAL
  Filled 2015-12-09: qty 1

## 2015-12-09 MED FILL — Medication: Qty: 1 | Status: AC

## 2015-12-10 ENCOUNTER — Inpatient Hospital Stay: Payer: Medicare Other

## 2015-12-10 DIAGNOSIS — Z5112 Encounter for antineoplastic immunotherapy: Secondary | ICD-10-CM | POA: Diagnosis not present

## 2015-12-10 MED ORDER — SODIUM CHLORIDE 0.9 % IV SOLN
Freq: Once | INTRAVENOUS | Status: AC
  Administered 2015-12-10: 10:00:00 via INTRAVENOUS
  Filled 2015-12-10: qty 1000

## 2015-12-10 MED ORDER — MAGNESIUM SULFATE 2 GM/50ML IV SOLN
2.0000 g | Freq: Once | INTRAVENOUS | Status: AC
  Administered 2015-12-10: 2 g via INTRAVENOUS
  Filled 2015-12-10: qty 50

## 2015-12-13 ENCOUNTER — Other Ambulatory Visit: Payer: Self-pay | Admitting: Family Medicine

## 2015-12-15 ENCOUNTER — Other Ambulatory Visit: Payer: Self-pay

## 2015-12-15 ENCOUNTER — Inpatient Hospital Stay (HOSPITAL_BASED_OUTPATIENT_CLINIC_OR_DEPARTMENT_OTHER): Payer: Medicare Other | Admitting: Hematology and Oncology

## 2015-12-15 ENCOUNTER — Inpatient Hospital Stay: Payer: Medicare Other

## 2015-12-15 VITALS — BP 108/63 | HR 101 | Temp 96.6°F | Resp 18 | Ht 67.0 in | Wt 163.3 lb

## 2015-12-15 DIAGNOSIS — M549 Dorsalgia, unspecified: Secondary | ICD-10-CM

## 2015-12-15 DIAGNOSIS — G8929 Other chronic pain: Secondary | ICD-10-CM

## 2015-12-15 DIAGNOSIS — I4891 Unspecified atrial fibrillation: Secondary | ICD-10-CM

## 2015-12-15 DIAGNOSIS — R197 Diarrhea, unspecified: Secondary | ICD-10-CM | POA: Diagnosis not present

## 2015-12-15 DIAGNOSIS — Z9484 Stem cells transplant status: Secondary | ICD-10-CM

## 2015-12-15 DIAGNOSIS — C9002 Multiple myeloma in relapse: Secondary | ICD-10-CM

## 2015-12-15 DIAGNOSIS — Z7901 Long term (current) use of anticoagulants: Secondary | ICD-10-CM

## 2015-12-15 DIAGNOSIS — I10 Essential (primary) hypertension: Secondary | ICD-10-CM

## 2015-12-15 DIAGNOSIS — Z888 Allergy status to other drugs, medicaments and biological substances status: Secondary | ICD-10-CM

## 2015-12-15 DIAGNOSIS — R634 Abnormal weight loss: Secondary | ICD-10-CM

## 2015-12-15 DIAGNOSIS — Z79899 Other long term (current) drug therapy: Secondary | ICD-10-CM

## 2015-12-15 DIAGNOSIS — Z5112 Encounter for antineoplastic immunotherapy: Secondary | ICD-10-CM | POA: Diagnosis not present

## 2015-12-15 LAB — COMPREHENSIVE METABOLIC PANEL
ALT: 20 U/L (ref 17–63)
AST: 15 U/L (ref 15–41)
Albumin: 3.5 g/dL (ref 3.5–5.0)
Alkaline Phosphatase: 52 U/L (ref 38–126)
Anion gap: 7 (ref 5–15)
BUN: 25 mg/dL — ABNORMAL HIGH (ref 6–20)
CO2: 25 mmol/L (ref 22–32)
Calcium: 9.5 mg/dL (ref 8.9–10.3)
Chloride: 103 mmol/L (ref 101–111)
Creatinine, Ser: 1.49 mg/dL — ABNORMAL HIGH (ref 0.61–1.24)
GFR calc Af Amer: 53 mL/min — ABNORMAL LOW (ref >60)
GFR calc non Af Amer: 46 mL/min — ABNORMAL LOW (ref >60)
Glucose, Bld: 128 mg/dL — ABNORMAL HIGH (ref 65–99)
Potassium: 4.3 mmol/L (ref 3.5–5.1)
Sodium: 135 mmol/L (ref 135–145)
Total Bilirubin: 0.5 mg/dL (ref 0.3–1.2)
Total Protein: 7 g/dL (ref 6.5–8.1)

## 2015-12-15 LAB — CBC WITH DIFFERENTIAL/PLATELET
Basophils Absolute: 0 10*3/uL (ref 0–0.1)
Basophils Relative: 1 %
Eosinophils Absolute: 0.2 10*3/uL (ref 0–0.7)
Eosinophils Relative: 3 %
HCT: 35.8 % — ABNORMAL LOW (ref 40.0–52.0)
Hemoglobin: 11.8 g/dL — ABNORMAL LOW (ref 13.0–18.0)
Lymphocytes Relative: 7 %
Lymphs Abs: 0.4 10*3/uL — ABNORMAL LOW (ref 1.0–3.6)
MCH: 34 pg (ref 26.0–34.0)
MCHC: 33.1 g/dL (ref 32.0–36.0)
MCV: 102.7 fL — ABNORMAL HIGH (ref 80.0–100.0)
Monocytes Absolute: 0.7 10*3/uL (ref 0.2–1.0)
Monocytes Relative: 14 %
Neutro Abs: 3.9 10*3/uL (ref 1.4–6.5)
Neutrophils Relative %: 75 %
Platelets: 269 10*3/uL (ref 150–440)
RBC: 3.48 MIL/uL — ABNORMAL LOW (ref 4.40–5.90)
RDW: 18.8 % — ABNORMAL HIGH (ref 11.5–14.5)
WBC: 5.2 10*3/uL (ref 3.8–10.6)

## 2015-12-15 LAB — MAGNESIUM: Magnesium: 1.7 mg/dL (ref 1.7–2.4)

## 2015-12-15 NOTE — Progress Notes (Signed)
Pt reports no changes and no issues since treatment.

## 2015-12-16 ENCOUNTER — Inpatient Hospital Stay: Payer: Medicare Other

## 2015-12-16 VITALS — BP 115/76 | HR 94 | Temp 96.7°F | Resp 18

## 2015-12-16 DIAGNOSIS — Z5112 Encounter for antineoplastic immunotherapy: Secondary | ICD-10-CM | POA: Diagnosis not present

## 2015-12-16 DIAGNOSIS — C9002 Multiple myeloma in relapse: Secondary | ICD-10-CM

## 2015-12-16 MED ORDER — DIPHENHYDRAMINE HCL 25 MG PO CAPS
50.0000 mg | ORAL_CAPSULE | Freq: Once | ORAL | Status: AC
  Administered 2015-12-16: 50 mg via ORAL
  Filled 2015-12-16: qty 2

## 2015-12-16 MED ORDER — SODIUM CHLORIDE 0.9 % IV SOLN
Freq: Once | INTRAVENOUS | Status: AC
  Administered 2015-12-16: 09:00:00 via INTRAVENOUS
  Filled 2015-12-16: qty 1000

## 2015-12-16 MED ORDER — SODIUM CHLORIDE 0.9 % IV SOLN
Freq: Once | INTRAVENOUS | Status: AC
  Administered 2015-12-16: 09:00:00 via INTRAVENOUS
  Filled 2015-12-16: qty 4

## 2015-12-16 MED ORDER — SODIUM CHLORIDE 0.9 % IV SOLN
1200.0000 mg | Freq: Once | INTRAVENOUS | Status: AC
  Administered 2015-12-16: 1200 mg via INTRAVENOUS
  Filled 2015-12-16: qty 60

## 2015-12-16 MED ORDER — ACETAMINOPHEN 325 MG PO TABS
650.0000 mg | ORAL_TABLET | Freq: Once | ORAL | Status: AC
  Administered 2015-12-16: 650 mg via ORAL
  Filled 2015-12-16: qty 2

## 2015-12-19 ENCOUNTER — Other Ambulatory Visit: Payer: Self-pay | Admitting: Hematology and Oncology

## 2015-12-19 ENCOUNTER — Encounter: Payer: Self-pay | Admitting: Hematology and Oncology

## 2015-12-19 NOTE — Progress Notes (Signed)
[facility]cer Center  Clinic day:  12/09/2015  Chief Complaint: Logan Perez is a 70 y.o. 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  HPI: The patient was last seen in the medical oncology clinic on 02/222017.  At that time, he was seen for assessment prior to initiation of Kyprolis, Decadron, and Pomalyst.  MUGA on 11/30/2015 revealed an ejection fraction of 46%.  Decision was made not to proceed with Kyprolis given his cardiac dysfunction.  We discussed initiation of daratumumab.  Side effects were reviewed.  Preauthorization was obtained.  I spoke with Dr. Porfirio Oar at [facility] regarding switch in therapy and ongoing coordination of his care with ultimate plan for second autologous stem cell transplant.  Per plan, the patient took Singulair yesterday and today.  He took 16 mg Decadron at 7:30 this morning.    Symptomatically, he is doing well.  He notes no new concerns.  He recently had his pain pump refilled.  Past Medical History  Diagnosis Date  . Pulmonary embolism (HCC)   . Stroke (HCC)   . GERD (gastroesophageal reflux disease)   . Hypertension   . Atrial fibrillation (HCC)   . HLD (hyperlipidemia)   . Multiple myeloma (HCC)   . Elevated PSA   . BPH (benign prostatic hyperplasia)   . Difficulty voiding   . Anxiety     Past Surgical History  Procedure Laterality Date  . Back surgery  1994  . Knee arthroscopy Left 1992  . Pain pump implantation  2012  . Stem cell implant  2008    [facility]    Family History  Problem Relation Age of Onset  . Cancer Father     throat  . Bladder Cancer Neg Hx   . Kidney disease Sister   . Prostate cancer Neg Hx   . Stroke      Social History:  reports that he has quit smoking. His smoking use included Cigarettes. He has a 30 pack-year smoking history. He does not have any smokeless tobacco  history on file. He reports that he does not drink alcohol or use illicit drugs.  The patient is accompanied by his wife, Mindi Junker, today.  Allergies:  Allergies  Allergen Reactions  . Azithromycin Diarrhea    Possible cause of C. Diff Possible cause of C. Diff  . 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 Rivaroxaban Rash    Current Medications: Current Outpatient Prescriptions  Medication Sig Dispense Refill  . ALPRAZolam (XANAX) 0.25 MG tablet TAKE 1 TABLET BY MOUTH EVERY NIGHT AT BEDTIME AS NEEDED FOR ANXIETY. 30 tablet 0  . apixaban (ELIQUIS) 5 MG TABS tablet Take 5 mg by mouth.    [name] atorvastatin (LIPITOR) 10 MG tablet Take by mouth daily at 6 PM.     . baclofen (LIORESAL) 10 MG tablet Take 1 tablet (10 mg total) by mouth at bedtime. 30 each 3  . calcium citrate-vitamin D (CITRACAL+D) 315-200 MG-UNIT per tablet Take 1 tablet by mouth daily.     . cetirizine (ZYRTEC) 10 MG tablet Take 10 mg by mouth daily.     . Cholecalciferol (VITAMIN D3) 2000 UNITS capsule Take 2,000 Units by mouth daily.     . Cyanocobalamin (RA VITAMIN  B-12 TR) 1000 MCG TBCR Take 1,000 mcg by mouth daily.     [name] dexamethasone (DECADRON) 4 MG tablet Take 16 mg (four 4 pills) on the day of treatment and 4 mg (1 pill) the day after treatment 20 tablet 2  . diltiazem (CARDIZEM CD) 120 MG 24 hr capsule TAKE 1 CAPSULE ONCE DAILY    . fluticasone (FLONASE) 50 MCG/ACT nasal spray Place into the nose.    . ibuprofen (ADVIL,MOTRIN) 200 MG tablet Take 200 mg by mouth every 6 (six) hours as needed for mild pain.     [name] loperamide (IMODIUM) 2 MG capsule Take 2 mg by mouth as needed for diarrhea or loose stools.     . montelukast (SINGULAIR) 10 MG tablet Take once a day as directed by MD (per treatment schedule). (Patient taking differently: Take 10 mg by  mouth at bedtime. Take once a day as directed by MD (per treatment schedule).) 30 tablet 1  . omeprazole (PRILOSEC) 20 MG capsule Take 20 mg by mouth daily.     [name] PAIN MANAGEMENT IT PUMP REFILL 1 each by Intrathecal route once. Medication: PF Fentanyl 1,500.0 mcg/ml PF Bupivicaine 30.0 mg/ml PF Clonidine 300.38mg/ml Total Volume: 40 ml Needed by 12-08-15 _0  1 each 0  . Probiotic Product ([place] CAPS Take 1 capsule by mouth daily.    . ranitidine (ZANTAC) 150 MG tablet Take 150 mg by mouth.    . tamsulosin (FLOMAX) 0.4 MG CAPS capsule Take 1 capsule (0.4 mg total) by mouth daily. 90 capsule 3  . valACYclovir (VALTREX) 500 MG tablet Take 1 tablet (500 mg total) by mouth daily. 30 tablet 3  . Iron-Vitamins (GERITOL PO) Take by mouth.    . potassium chloride SA (K-DUR,KLOR-CON) 20 MEQ tablet TAKE 1 TABLET(20 MEQ) BY MOUTH TWICE DAILY 180 tablet 0   No current facility-administered medications for this visit.    Review of Systems:  GENERAL:  Energy level is fair.  No fevers or sweats .  Weight stable. PERFORMANCE STATUS (ECOG): 1 HEENT:  Cataracts. No runny nose, sore throat, mouth sores or tenderness. Lungs: No shortness of breath or cough.  No hemoptysis. Cardiac:  No chest pain, palpitations, orthopnea, or PND. GI:  Diarrhea (no change).  Appetite good.  No nausea, vomiting, diarrhea, constipation, melena or hematochezia. GU:  No urgency, frequency, dysuria, or hematuria. Musculoskeletal:  Compression fracture of back on a pain pump (recently filled).  No joint pain.  No muscle tenderness. Extremities:  No pain or swelling. Skin:  Fragile skin.  No rashes or skin changes. Neuro:  Little tremor (getting worse over the years).  Neuropathy in feet.  No headache, weakness, balance or coordination issues. Endocrine:  No diabetes, thyroid issues, hot flashes or night sweats. Psych:  No mood changes, depression or anxiety. Pain:  Chronic back pain (see above). Review of  systems:  All other systems reviewed and found to be negative.   Physical Exam: Blood pressure 121/79, pulse 104, temperature 97.9 F (36.6 C), temperature source Tympanic, resp. rate 18, height _1  (1.702 m), weight 164 lb 14.5 oz (74.8 kg). GENERAL:  Well developed, well nourished, sitting comfortably in the exam room in no acute distress. MENTAL STATUS:  Alert and oriented to person, place and time. HEAD:  GPearline Cablesshort hair with goatee.  Normocephalic, atraumatic, face symmetric, no Cushingoid features. EYES:  Glasses.  Blue eyes.  Pupils equal round and reactive to light and accomodation.  No conjunctivitis or scleral icterus. ENT:  Oropharynx clear without lesion.  Hearing aide.  Tongue normal. Mucous membranes moist.  RESPIRATORY:  Clear to auscultation without rales, wheezes or rhonchi. CARDIOVASCULAR:  Regular rate and rhythm without murmur, rub or gallop. ABDOMEN:  RUQ pain pump with blue areas on skin s/p recent filling.  Soft, non-tender, with active bowel sounds, and no hepatosplenomegaly.  No masses. SKIN:  No rashes, ulcers or lesions. EXTREMITIES: No edema, no skin discoloration or tenderness.  No palpable cords. LYMPH NODES: No palpable cervical, supraclavicular, axillary or inguinal adenopathy  NEUROLOGICAL: Intention tremor. PSYCH:  Appropriate.   Appointment on 12/09/2015  Component Date Value Ref Range Status  . WBC 12/09/2015 3.2* 3.8 - 10.6 K/uL Final  . RBC 12/09/2015 3.06* 4.40 - 5.90 MIL/uL Final  . Hemoglobin 12/09/2015 10.5* 13.0 - 18.0 g/dL Final  . HCT 12/09/2015 31.3* 40.0 - 52.0 % Final  . MCV 12/09/2015 102.1* 80.0 - 100.0 fL Final  . MCH 12/09/2015 34.4* 26.0 - 34.0 pg Final  . MCHC 12/09/2015 33.7  32.0 - 36.0 g/dL Final  . RDW 12/09/2015 18.4* 11.5 - 14.5 % Final  . Platelets 12/09/2015 199  150 - 440 K/uL Final  . Neutrophils Relative % 12/09/2015 57   Final  . Neutro Abs 12/09/2015 1.8  1.4 - 6.5 K/uL Final  . Lymphocytes Relative 12/09/2015 20    Final  . Lymphs Abs 12/09/2015 0.6* 1.0 - 3.6 K/uL Final  . Monocytes Relative 12/09/2015 17   Final  . Monocytes Absolute 12/09/2015 0.6  0.2 - 1.0 K/uL Final  . Eosinophils Relative 12/09/2015 5   Final  . Eosinophils Absolute 12/09/2015 0.1  0 - 0.7 K/uL Final  . Basophils Relative 12/09/2015 1   Final  . Basophils Absolute 12/09/2015 0.0  0 - 0.1 K/uL Final  . Sodium 12/09/2015 133* 135 - 145 mmol/L Final  . Potassium 12/09/2015 3.7  3.5 - 5.1 mmol/L Final  . Chloride 12/09/2015 103  101 - 111 mmol/L Final  . CO2 12/09/2015 23  22 - 32 mmol/L Final  . Glucose, Bld 12/09/2015 111* 65 - 99 mg/dL Final  . BUN 12/09/2015 14  6 - 20 mg/dL Final  . Creatinine, Ser 12/09/2015 1.32* 0.61 - 1.24 mg/dL Final  . Calcium 12/09/2015 9.2  8.9 - 10.3 mg/dL Final  . Total Protein 12/09/2015 7.6  6.5 - 8.1 g/dL Final  . Albumin 12/09/2015 3.2* 3.5 - 5.0 g/dL Final  . AST 12/09/2015 25  15 - 41 U/L Final  . ALT 12/09/2015 25  17 - 63 U/L Final  . Alkaline Phosphatase 12/09/2015 52  38 - 126 U/L Final  . Total Bilirubin 12/09/2015 0.5  0.3 - 1.2 mg/dL Final  . GFR calc non Af Amer 12/09/2015 53* >60 mL/min Final  . GFR calc Af Amer 12/09/2015 >60  >60 mL/min Final   Comment: (NOTE) The eGFR has been calculated using the CKD EPI equation. This calculation has not been validated in all clinical situations. eGFR's persistently <60 mL/min signify possible Chronic Kidney Disease.   . Anion gap 12/09/2015 7  5 - 15 Final  . Magnesium 12/09/2015 1.6* 1.7 - 2.4 mg/dL Final  . ABO/RH(D) 12/09/2015 B POS   Final  . Antibody Screen 12/09/2015 NEG   Final  . Sample Expiration 12/09/2015 12/12/2015   Final  . Polyspecific AHG test 12/09/2015 NEG   Final  . ABO/RH(D) 12/09/2015 B POS   Final    Assessment:  Savoy Somerville is a 70 y.o. male  with stage III mutiple myeloma.  He initially presented with progressive back pain beginning in 12/2006.  MRI revealed "spots and compression fractures".  He  began Velcade, thalidomide, and Decadron.  In 08/2007, he underwent high dose chemotherapy and autologous stem cell transplant.  He recurred with a rising M-spike (2.7) with repeat M spike (1.7 gm/dl) in 03/2010.  He was initially treated with Velcade (02/08/2010 - 05/10/2010).  He then began Revlimid (15 mg 3 weeks on/1 week off) and Decadron (40 mg on day 1, 8, 15, 22).  Because of significant side effect with Decadron his dose was decreased to 10 mg once a week in 07/2010.    He was on maintenance Revlimid. Revlimid was initially10 mg 3 weeks on/1 week off. This was changed to 10 mg 2 weeks on/2 weeks off secondary to right nipple tenderness. His dose was increased to 10 mg 3 weeks on/1 week off with Decadron 10 mg a week (on Sundays) and then Revlamid 15 mg 3 weeks on and 1 week off with Decadron on Sundays.  He began Pomalyst 4 mg 3 weeks on/1 week off with Decadron on 08/27/2015.  Over the past year his SPEP has revealed no monoclonal protein (04/21/2015) and 0.5 gm/dL on 09/22/2015 and 10/20/2015. Free light chains have been monitored. Kappa free light chains were 18.54 on 11/21/2013, 18.37 on 02/20/2014, 18.93 on 05/22/2014, 32.58 (high; normal ratio 1.27) on 08/21/2014, 51.53 (high; elevated ratio of 2.12) on 11/13/2014, 28.08 (ratio 1.73) on 12/09/2014, 23.71 (ratio 2.17) on 01/18/2015, 92.93 (ratio 9.49) on 04/21/2015, 93.44 (ratio 10.28) on 05/26/2015, 255.45 (ratio of 24.05) on 07/14/2015, 373.89 (ratio 48.31) on 08/04/2015, 474.33 (ratio 70.58) on 08/25/2015, 450.76 (ratio 55.44) on 09/22/2015, and 453.4 (ratio 59.89) on 10/20/2015.   Bone survey on 12/08/2014 was stable.  Bone survey on 10/21/2015 revealed increase conspicuity of subcentimeter lytic lesions in the calvarium.  Bone marrow aspirate and biopsy on 11/04/2015 revealed an atypical monoclonal plasma cells estimated at 30-40% of marrow cells.   Marrow was variably cellular (approximately 45%) with background trilineage  hematopoiesis. There was no significant increase in marrow reticulin fibers. Storage iron was present.    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 He develoed herpes zoster in 04/2008. He developed a pulmonary embolism in 05/2013. He was initially on Xarelto, but is now on Eliquis. He had an episode of pneumonia around this time requiring a brief admission. He developed severe lower leg cramps on 08/07/2014 secondary to hypokalemia. Duplex was negative.   He was treated for C difficile colitis (Flagyl completed 07/30/2015).  He has a chronic indwelling pain pump.  He received 4 cycles of Pomalyst and Decadron (08/27/2015 -11/19/2015).  Restaging studies document progressive disease.  Kappa free light chains are increasing.  SPEP revealed 0.5 gm/dL monoclonal protein then 1.3 gm/dL.  Bone survey reveals increase conspicuity of subcentimeter lytic lesions in the calvarium.  Bone marrow reveals 30-40% plasma cells.   MUGA on 11/30/2015 revealed an ejection fraction of 46%.  He is felt not to be a good candidate for Kyprolis.  There were no focal wall motion abnormalities.  He had a stress echo less than 1 year ago.  He has a history of PVCs and atrial fibrillation.  He takes Cardizem prn.  He has met with the bone marrow transplant team at [facility] with the plan for salvage chemotherapy and consideration of a second autologous stem cell transplant.  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  Symptomatically, he has chronic diarrhea.  Exam is stable.  He has mild hypomagnesemia (1.6).  Plan:  1.  Labs today:  CBC with diff, CMP, Mg, [place] (direct and indirect). 2.  Review treatment plan.  Discuss potential allergic reaction to dartumumab.  Review Singular day before, day of, and 2 days after.  Discuss Decadron 16 mg at least 1 hour prior to treatment and 4 mg on day 2 and 3. 3.  Week #1 daratumumab 16 mg/kg IV today.  Premedications:   Ondansetron 8 mg IV, Benadryl 50 mg po, and Tylenol 650 mg po. 4.  RTC tomorrow in [place] for IV magnesium. 5.  RTC on 12/15/2015 in [place] for MD assessment, and labs (CBC with diff, CMP, Mg). 6.  RTC on 12/16/2015 in [place] for week #2 daratumumab (Darzalex).   Lequita Asal, MD  12/09/2015

## 2015-12-19 NOTE — Progress Notes (Signed)
[facility] day:  12/15/2015  Chief Complaint: Logan Perez is a 70 y.o. 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  HPI: The patient was last seen in the medical oncology clinic on 12/08/2015.  At that time, he began week #1 daratumumab.  He tolerated his infusion well.  Prior to initiation of treatment, he experienced a brief episode of tachycardia.  He required no intervention.  He received magnesium IV the following day.  He states that he feels that his diarrhea is due to dairy products.  He is going to try Lactaid.  He comments that he is also taking Geritol.   Past Medical History  Diagnosis Date  . Pulmonary embolism ([place])   . Stroke ([place])   . GERD (gastroesophageal reflux disease)   . Hypertension   . Atrial fibrillation ([place])   . HLD (hyperlipidemia)   . Multiple myeloma ([place])   . Elevated PSA   . BPH (benign prostatic hyperplasia)   . Difficulty voiding   . Anxiety     Past Surgical History  Procedure Laterality Date  . Back surgery  1994  . Knee arthroscopy Left 1992  . Pain pump implantation  2012  . Stem cell implant  2008    [facility]    Family History  Problem Relation Age of Onset  . Cancer Father     throat  . Bladder Cancer Neg Hx   . Kidney disease Sister   . Prostate cancer Neg Hx   . Stroke      Social History:  reports that he has quit smoking. His smoking use included Cigarettes. He has a 30 pack-year smoking history. He does not have any smokeless tobacco history on file. He reports that he does not drink alcohol or use illicit drugs.  The patient is accompanied by his wife, [name], today.  Allergies:  Allergies  Allergen Reactions  . Azithromycin Diarrhea    Possible cause of C. Diff Possible cause of C. Diff  . 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 Rivaroxaban Rash    Current Medications: Current Outpatient Prescriptions  Medication Sig Dispense Refill  . ALPRAZolam (XANAX) 0.25 MG tablet TAKE 1 TABLET BY MOUTH EVERY NIGHT AT BEDTIME AS NEEDED FOR ANXIETY. 30 tablet 0  . apixaban (ELIQUIS) 5 MG TABS tablet Take 5 mg by mouth.    [name] atorvastatin (LIPITOR) 10 MG tablet Take by mouth daily at 6 PM.     . baclofen (LIORESAL) 10 MG tablet Take 1 tablet (10 mg total) by mouth at bedtime. 30 each 3  . calcium citrate-vitamin D (CITRACAL+D) 315-200 MG-UNIT per tablet Take 1 tablet by mouth daily.     . cetirizine (ZYRTEC) 10 MG tablet Take 10 mg by mouth daily.     . Cholecalciferol (VITAMIN D3) 2000 UNITS capsule Take 2,000 Units by mouth daily.     . Cyanocobalamin (RA VITAMIN B-12 TR) 1000 MCG TBCR Take 1,000 mcg by mouth daily.     [name] dexamethasone (DECADRON) 4 MG tablet Take 16 mg (four 4 pills) on the day of treatment and 4 mg (1 pill) the day after treatment 20 tablet 2  . diltiazem (CARDIZEM CD) 120 MG 24 hr  capsule TAKE 1 CAPSULE ONCE DAILY    . fluticasone (FLONASE) 50 MCG/ACT nasal spray Place into the nose.    . ibuprofen (ADVIL,MOTRIN) 200 MG tablet Take 200 mg by mouth every 6 (six) hours as needed for mild pain.     . Iron-Vitamins (GERITOL PO) Take by mouth.    . loperamide (IMODIUM) 2 MG capsule Take 2 mg by mouth as needed for diarrhea or loose stools.     . montelukast (SINGULAIR) 10 MG tablet Take once a day as directed by MD (per treatment schedule). (Patient taking differently: Take 10 mg by mouth at bedtime. Take once a day as directed by MD (per treatment schedule).) 30 tablet 1  . omeprazole (PRILOSEC) 20 MG capsule Take 20 mg by mouth daily.     [name] PAIN MANAGEMENT IT PUMP REFILL 1 each by Intrathecal route once. Medication: PF Fentanyl 1,500.0 mcg/ml PF Bupivicaine 30.0 mg/ml PF  Clonidine 300.62mg/ml Total Volume: 40 ml Needed by 12-08-15 _0  1 each 0  . Probiotic Product (PSummit CAPS Take 1 capsule by mouth daily.    . ranitidine (ZANTAC) 150 MG tablet Take 150 mg by mouth.    . tamsulosin (FLOMAX) 0.4 MG CAPS capsule Take 1 capsule (0.4 mg total) by mouth daily. 90 capsule 3  . valACYclovir (VALTREX) 500 MG tablet Take 1 tablet (500 mg total) by mouth daily. 30 tablet 3  . potassium chloride SA (K-DUR,KLOR-CON) 20 MEQ tablet TAKE 1 TABLET(20 MEQ) BY MOUTH TWICE DAILY 180 tablet 0   No current facility-administered medications for this visit.    Review of Systems:  GENERAL:  Energy level is good.  No fevers or sweats .  Weight down 1 pound. PERFORMANCE STATUS (ECOG): 1 HEENT:  Cataracts. No runny nose, sore throat, mouth sores or tenderness. Lungs: No shortness of breath or cough.  No hemoptysis. Cardiac:  No chest pain, palpitations, orthopnea, or PND. GI:  Diarrhea (possibly related to dairy products).  Appetite good.  No nausea, vomiting, diarrhea, constipation, melena or hematochezia. GU:  No urgency, frequency, dysuria, or hematuria. Musculoskeletal:  Compression fracture of back on a pain pump.  No joint pain.  No muscle tenderness. Extremities:  No pain or swelling. Skin:  Fragile skin.  No rashes or skin changes. Neuro:  Little tremor (chronic).  Neuropathy in feet.  No headache, weakness, balance or coordination issues. Endocrine:  No diabetes, thyroid issues, hot flashes or night sweats. Psych:  No mood changes, depression or anxiety. Pain:  Chronic back pain (pain well managed with pump). Review of systems:  All other systems reviewed and found to be negative.   Physical Exam: Blood pressure 108/63, pulse 101, temperature 96.6 F (35.9 C), temperature source Tympanic, resp. rate 18, height _1  (1.702 m), weight 163 lb 4 oz (74.05 kg). GENERAL:  Well developed, well nourished, sitting comfortably in the exam room in no acute  distress. MENTAL STATUS:  Alert and oriented to person, place and time. HEAD:  GPearline Cablesshort hair with goatee.  Normocephalic, atraumatic, face symmetric, no Cushingoid features. EYES:  Glasses.  Blue eyes.  Pupils equal round and reactive to light and accomodation.  No conjunctivitis or scleral icterus. ENT:  Oropharynx clear without lesion.  Hearing aide.  Tongue normal. Mucous membranes moist.  RESPIRATORY:  Clear to auscultation without rales, wheezes or rhonchi. CARDIOVASCULAR:  Regular rate and rhythm without murmur, rub or gallop. ABDOMEN:  RUQ pain pump.  Soft, non-tender, with active bowel sounds, and no hepatosplenomegaly.  No masses. SKIN:  No rashes, ulcers or lesions. EXTREMITIES: No edema, no skin discoloration or tenderness.  No palpable cords. LYMPH NODES: No palpable cervical, supraclavicular, axillary or inguinal adenopathy  NEUROLOGICAL: Intention tremor. PSYCH:  Appropriate.   Appointment on 12/15/2015  Component Date Value Ref Range Status  . WBC 12/15/2015 5.2  3.8 - 10.6 K/uL Final  . RBC 12/15/2015 3.48* 4.40 - 5.90 MIL/uL Final  . Hemoglobin 12/15/2015 11.8* 13.0 - 18.0 g/dL Final  . HCT 12/15/2015 35.8* 40.0 - 52.0 % Final  . MCV 12/15/2015 102.7* 80.0 - 100.0 fL Final  . MCH 12/15/2015 34.0  26.0 - 34.0 pg Final  . MCHC 12/15/2015 33.1  32.0 - 36.0 g/dL Final  . RDW 12/15/2015 18.8* 11.5 - 14.5 % Final  . Platelets 12/15/2015 269  150 - 440 K/uL Final  . Neutrophils Relative % 12/15/2015 75   Final  . Neutro Abs 12/15/2015 3.9  1.4 - 6.5 K/uL Final  . Lymphocytes Relative 12/15/2015 7   Final  . Lymphs Abs 12/15/2015 0.4* 1.0 - 3.6 K/uL Final  . Monocytes Relative 12/15/2015 14   Final  . Monocytes Absolute 12/15/2015 0.7  0.2 - 1.0 K/uL Final  . Eosinophils Relative 12/15/2015 3   Final  . Eosinophils Absolute 12/15/2015 0.2  0 - 0.7 K/uL Final  . Basophils Relative 12/15/2015 1   Final  . Basophils Absolute 12/15/2015 0.0  0 - 0.1 K/uL Final  . Magnesium  12/15/2015 1.7  1.7 - 2.4 mg/dL Final  . Sodium 12/15/2015 135  135 - 145 mmol/L Final  . Potassium 12/15/2015 4.3  3.5 - 5.1 mmol/L Final  . Chloride 12/15/2015 103  101 - 111 mmol/L Final  . CO2 12/15/2015 25  22 - 32 mmol/L Final  . Glucose, Bld 12/15/2015 128* 65 - 99 mg/dL Final  . BUN 12/15/2015 25* 6 - 20 mg/dL Final  . Creatinine, Ser 12/15/2015 1.49* 0.61 - 1.24 mg/dL Final  . Calcium 12/15/2015 9.5  8.9 - 10.3 mg/dL Final  . Total Protein 12/15/2015 7.0  6.5 - 8.1 g/dL Final  . Albumin 12/15/2015 3.5  3.5 - 5.0 g/dL Final  . AST 12/15/2015 15  15 - 41 U/L Final  . ALT 12/15/2015 20  17 - 63 U/L Final  . Alkaline Phosphatase 12/15/2015 52  38 - 126 U/L Final  . Total Bilirubin 12/15/2015 0.5  0.3 - 1.2 mg/dL Final  . GFR calc non Af Amer 12/15/2015 46* >60 mL/min Final  . GFR calc Af Amer 12/15/2015 53* >60 mL/min Final   Comment: (NOTE) The eGFR has been calculated using the CKD EPI equation. This calculation has not been validated in all clinical situations. eGFR's persistently <60 mL/min signify possible Chronic Kidney Disease.   . Anion gap 12/15/2015 7  5 - 15 Final    Assessment:  Logan Perez is a 70 y.o. male with stage III mutiple myeloma.  He initially presented with progressive back pain beginning in 12/2006.  MRI revealed "spots and compression fractures".  He began Velcade, thalidomide, and Decadron.  In 08/2007, he underwent high dose chemotherapy and autologous stem cell transplant.  He recurred with a rising M-spike (2.7) with repeat M spike (1.7 gm/dl) in 03/2010.  He was initially treated with Velcade (02/08/2010 - 05/10/2010).  He then began Revlimid (15 mg 3 weeks on/1 week off) and Decadron (40 mg on day 1, 8, 15, 22).  Because of significant side effect with Decadron his dose was  decreased to 10 mg once a week in 07/2010.    He was on maintenance Revlimid. Revlimid was initially10 mg 3 weeks on/1 week off. This was changed to 10 mg 2 weeks on/2  weeks off secondary to right nipple tenderness. His dose was increased to 10 mg 3 weeks on/1 week off with Decadron 10 mg a week (on Sundays) and then Revlamid 15 mg 3 weeks on and 1 week off with Decadron on Sundays.  He began Pomalyst 4 mg 3 weeks on/1 week off with Decadron on 08/27/2015.  Over the past year his SPEP has revealed no monoclonal protein (04/21/2015) and 0.5 gm/dL on 09/22/2015 and 10/20/2015. Free light chains have been monitored. Kappa free light chains were 18.54 on 11/21/2013, 18.37 on 02/20/2014, 18.93 on 05/22/2014, 32.58 (high; normal ratio 1.27) on 08/21/2014, 51.53 (high; elevated ratio of 2.12) on 11/13/2014, 28.08 (ratio 1.73) on 12/09/2014, 23.71 (ratio 2.17) on 01/18/2015, 92.93 (ratio 9.49) on 04/21/2015, 93.44 (ratio 10.28) on 05/26/2015, 255.45 (ratio of 24.05) on 07/14/2015, 373.89 (ratio 48.31) on 08/04/2015, 474.33 (ratio 70.58) on 08/25/2015, 450.76 (ratio 55.44) on 09/22/2015, and 453.4 (ratio 59.89) on 10/20/2015.   Bone survey on 12/08/2014 was stable.  Bone survey on 10/21/2015 revealed increase conspicuity of subcentimeter lytic lesions in the calvarium.  Bone marrow aspirate and biopsy on 11/04/2015 revealed an atypical monoclonal plasma cells estimated at 30-40% of marrow cells.   Marrow was variably cellular (approximately 45%) with background trilineage hematopoiesis. There was no significant increase in marrow reticulin fibers. Storage iron was present.    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 He develoed herpes zoster in 04/2008. He developed a pulmonary embolism in 05/2013. He was initially on Xarelto, but is now on Eliquis. He had an episode of pneumonia around this time requiring a brief admission. He developed severe lower leg cramps on 08/07/2014 secondary to hypokalemia. Duplex was negative.   He was treated for C difficile colitis (Flagyl completed 07/30/2015).  He has a chronic  indwelling pain pump.  He received 4 cycles of Pomalyst and Decadron (08/27/2015 - 11/19/2015).  Restaging studies document progressive disease.  Kappa free light chains are increasing.  SPEP revealed 0.5 gm/dL monoclonal protein then 1.3 gm/dL.  Bone survey reveals increase conspicuity of subcentimeter lytic lesions in the calvarium.  Bone marrow reveals 30-40% plasma cells.   MUGA on 11/30/2015 revealed an ejection fraction of 46%.  He is felt not to be a good candidate for Kyprolis.  There were no focal wall motion abnormalities.  He had a stress echo less than 1 year ago.  He has a history of PVCs and atrial fibrillation.  He takes Cardizem prn.  He has met with the bone marrow transplant team at [facility] with the plan for salvage chemotherapy and consideration of a second autologous stem cell transplant.  He has frozen stem cells from his first transplant.  He is s/p week #1 daratumumab (Darzalex) on 12/09/2015.  He tolerated his first infusion well.    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  Exam is stable.   Plan:  1.  Labs today:  CBC with diff, CMP, Mg. 2.  Review premedications taken at home (Decadron 16 mg at least 1 hour prior to treatment; Singulair day before, day of, and 2 days after).  Re-review Decadron 43m on day 2 and 3. 3.  [place]2 dar[place] 16 [place]  Premedications:  Ondansetron 8  mg IV, Benadryl 50 mg po, and Tylenol 650 mg po. 4.  RTC on 12/22/2015 and 12/29/2015 in [place] for labs on the day before treatment (CBC with diff, CMP, Mg). 5.  RTC on 12/23/2015 and 12/30/2015 in [place] for week #3 and #4 daratumumab (Darzalex). 6.  RTC in [place] on the day after treatment if IV magnesium needed and unable to give in [place] on day of treatment. 7.  RTC on 01/05/2016 in [place] for MD assessment, labs (CBC with diff, CMP, Mg, SPEP, FLCA), and week #5 daratumumab in [place] on 01/06/2016.   Lequita Asal, MD   12/15/2015

## 2015-12-20 ENCOUNTER — Other Ambulatory Visit: Payer: Self-pay | Admitting: *Deleted

## 2015-12-20 MED ORDER — ALPRAZOLAM 0.25 MG PO TABS: ORAL_TABLET | ORAL | Status: DC

## 2015-12-22 ENCOUNTER — Inpatient Hospital Stay: Payer: Medicare Other

## 2015-12-22 DIAGNOSIS — C9002 Multiple myeloma in relapse: Secondary | ICD-10-CM

## 2015-12-22 DIAGNOSIS — Z5112 Encounter for antineoplastic immunotherapy: Secondary | ICD-10-CM | POA: Diagnosis not present

## 2015-12-22 LAB — CBC WITH DIFFERENTIAL/PLATELET
Basophils Absolute: 0.1 10*3/uL (ref 0–0.1)
Basophils Relative: 1 %
Eosinophils Absolute: 0.2 10*3/uL (ref 0–0.7)
Eosinophils Relative: 3 %
HCT: 36.6 % — ABNORMAL LOW (ref 40.0–52.0)
Hemoglobin: 12.3 g/dL — ABNORMAL LOW (ref 13.0–18.0)
Lymphocytes Relative: 5 %
Lymphs Abs: 0.4 10*3/uL — ABNORMAL LOW (ref 1.0–3.6)
MCH: 34.3 pg — ABNORMAL HIGH (ref 26.0–34.0)
MCHC: 33.6 g/dL (ref 32.0–36.0)
MCV: 102.1 fL — ABNORMAL HIGH (ref 80.0–100.0)
Monocytes Absolute: 0.9 10*3/uL (ref 0.2–1.0)
Monocytes Relative: 11 %
Neutro Abs: 7 10*3/uL — ABNORMAL HIGH (ref 1.4–6.5)
Neutrophils Relative %: 80 %
Platelets: 224 10*3/uL (ref 150–440)
RBC: 3.58 MIL/uL — ABNORMAL LOW (ref 4.40–5.90)
RDW: 18.2 % — ABNORMAL HIGH (ref 11.5–14.5)
WBC: 8.7 10*3/uL (ref 3.8–10.6)

## 2015-12-22 LAB — COMPREHENSIVE METABOLIC PANEL
ALT: 15 U/L — ABNORMAL LOW (ref 17–63)
AST: 12 U/L — ABNORMAL LOW (ref 15–41)
Albumin: 3.6 g/dL (ref 3.5–5.0)
Alkaline Phosphatase: 59 U/L (ref 38–126)
Anion gap: 5 (ref 5–15)
BUN: 23 mg/dL — ABNORMAL HIGH (ref 6–20)
CO2: 26 mmol/L (ref 22–32)
Calcium: 8.9 mg/dL (ref 8.9–10.3)
Chloride: 103 mmol/L (ref 101–111)
Creatinine, Ser: 1.31 mg/dL — ABNORMAL HIGH (ref 0.61–1.24)
GFR calc Af Amer: >60 mL/min (ref >60)
GFR calc non Af Amer: 54 mL/min — ABNORMAL LOW (ref >60)
Glucose, Bld: 123 mg/dL — ABNORMAL HIGH (ref 65–99)
Potassium: 3.9 mmol/L (ref 3.5–5.1)
Sodium: 134 mmol/L — ABNORMAL LOW (ref 135–145)
Total Bilirubin: 0.6 mg/dL (ref 0.3–1.2)
Total Protein: 6.5 g/dL (ref 6.5–8.1)

## 2015-12-22 LAB — MAGNESIUM: Magnesium: 1.7 mg/dL (ref 1.7–2.4)

## 2015-12-23 ENCOUNTER — Other Ambulatory Visit: Payer: Self-pay | Admitting: Hematology and Oncology

## 2015-12-23 ENCOUNTER — Other Ambulatory Visit: Payer: Self-pay | Admitting: *Deleted

## 2015-12-23 ENCOUNTER — Inpatient Hospital Stay: Payer: Medicare Other

## 2015-12-23 DIAGNOSIS — C9002 Multiple myeloma in relapse: Secondary | ICD-10-CM

## 2015-12-23 DIAGNOSIS — Z5112 Encounter for antineoplastic immunotherapy: Secondary | ICD-10-CM | POA: Diagnosis not present

## 2015-12-23 MED ORDER — ACETAMINOPHEN 325 MG PO TABS
650.0000 mg | ORAL_TABLET | Freq: Once | ORAL | Status: AC
  Administered 2015-12-23: 650 mg via ORAL
  Filled 2015-12-23: qty 2

## 2015-12-23 MED ORDER — APIXABAN 5 MG PO TABS: 5.0000 mg | ORAL_TABLET | Freq: Two times a day (BID) | ORAL | Status: DC

## 2015-12-23 MED ORDER — SODIUM CHLORIDE 0.9 % IV SOLN
1200.0000 mg | Freq: Once | INTRAVENOUS | Status: AC
  Administered 2015-12-23: 1200 mg via INTRAVENOUS
  Filled 2015-12-23: qty 60

## 2015-12-23 MED ORDER — SODIUM CHLORIDE 0.9 % IV SOLN
Freq: Once | INTRAVENOUS | Status: AC
  Administered 2015-12-23: 09:00:00 via INTRAVENOUS
  Filled 2015-12-23: qty 4

## 2015-12-23 MED ORDER — SODIUM CHLORIDE 0.9 % IV SOLN
Freq: Once | INTRAVENOUS | Status: AC
  Administered 2015-12-23: 09:00:00 via INTRAVENOUS
  Filled 2015-12-23: qty 1000

## 2015-12-23 MED ORDER — DIPHENHYDRAMINE HCL 25 MG PO CAPS
50.0000 mg | ORAL_CAPSULE | Freq: Once | ORAL | Status: AC
  Administered 2015-12-23: 50 mg via ORAL
  Filled 2015-12-23: qty 2

## 2015-12-25 ENCOUNTER — Other Ambulatory Visit: Payer: Self-pay | Admitting: Hematology and Oncology

## 2015-12-29 ENCOUNTER — Inpatient Hospital Stay: Payer: Medicare Other

## 2015-12-29 DIAGNOSIS — C9002 Multiple myeloma in relapse: Secondary | ICD-10-CM

## 2015-12-29 DIAGNOSIS — Z5112 Encounter for antineoplastic immunotherapy: Secondary | ICD-10-CM | POA: Diagnosis not present

## 2015-12-29 LAB — COMPREHENSIVE METABOLIC PANEL
ALT: 14 U/L — ABNORMAL LOW (ref 17–63)
AST: 11 U/L — ABNORMAL LOW (ref 15–41)
Albumin: 3.6 g/dL (ref 3.5–5.0)
Alkaline Phosphatase: 58 U/L (ref 38–126)
Anion gap: 4 — ABNORMAL LOW (ref 5–15)
BUN: 19 mg/dL (ref 6–20)
CO2: 27 mmol/L (ref 22–32)
Calcium: 9.1 mg/dL (ref 8.9–10.3)
Chloride: 102 mmol/L (ref 101–111)
Creatinine, Ser: 1.28 mg/dL — ABNORMAL HIGH (ref 0.61–1.24)
GFR calc Af Amer: >60 mL/min (ref >60)
GFR calc non Af Amer: 55 mL/min — ABNORMAL LOW (ref >60)
Glucose, Bld: 94 mg/dL (ref 65–99)
Potassium: 4.2 mmol/L (ref 3.5–5.1)
Sodium: 133 mmol/L — ABNORMAL LOW (ref 135–145)
Total Bilirubin: 0.7 mg/dL (ref 0.3–1.2)
Total Protein: 6.2 g/dL — ABNORMAL LOW (ref 6.5–8.1)

## 2015-12-29 LAB — CBC WITH DIFFERENTIAL/PLATELET
Basophils Absolute: 0 10*3/uL (ref 0–0.1)
Basophils Relative: 0 %
Eosinophils Absolute: 0.2 10*3/uL (ref 0–0.7)
Eosinophils Relative: 3 %
HCT: 36.3 % — ABNORMAL LOW (ref 40.0–52.0)
Hemoglobin: 12.1 g/dL — ABNORMAL LOW (ref 13.0–18.0)
Lymphocytes Relative: 5 %
Lymphs Abs: 0.3 10*3/uL — ABNORMAL LOW (ref 1.0–3.6)
MCH: 34 pg (ref 26.0–34.0)
MCHC: 33.5 g/dL (ref 32.0–36.0)
MCV: 101.6 fL — ABNORMAL HIGH (ref 80.0–100.0)
Monocytes Absolute: 0.8 10*3/uL (ref 0.2–1.0)
Monocytes Relative: 11 %
Neutro Abs: 6.3 10*3/uL (ref 1.4–6.5)
Neutrophils Relative %: 81 %
Platelets: 169 10*3/uL (ref 150–440)
RBC: 3.57 MIL/uL — ABNORMAL LOW (ref 4.40–5.90)
RDW: 17.7 % — ABNORMAL HIGH (ref 11.5–14.5)
WBC: 7.7 10*3/uL (ref 3.8–10.6)

## 2015-12-29 LAB — MAGNESIUM: Magnesium: 1.8 mg/dL (ref 1.7–2.4)

## 2015-12-30 ENCOUNTER — Other Ambulatory Visit: Payer: Self-pay | Admitting: Hematology and Oncology

## 2015-12-30 ENCOUNTER — Inpatient Hospital Stay: Payer: Medicare Other

## 2015-12-30 VITALS — BP 120/78 | HR 94 | Temp 96.5°F | Resp 20

## 2015-12-30 DIAGNOSIS — Z5112 Encounter for antineoplastic immunotherapy: Secondary | ICD-10-CM | POA: Diagnosis not present

## 2015-12-30 DIAGNOSIS — C9002 Multiple myeloma in relapse: Secondary | ICD-10-CM

## 2015-12-30 MED ORDER — SODIUM CHLORIDE 0.9 % IV SOLN
1200.0000 mg | Freq: Once | INTRAVENOUS | Status: AC
  Administered 2015-12-30: 1200 mg via INTRAVENOUS
  Filled 2015-12-30: qty 60

## 2015-12-30 MED ORDER — DIPHENHYDRAMINE HCL 25 MG PO CAPS
50.0000 mg | ORAL_CAPSULE | Freq: Once | ORAL | Status: AC
  Administered 2015-12-30: 50 mg via ORAL
  Filled 2015-12-30: qty 2

## 2015-12-30 MED ORDER — SODIUM CHLORIDE 0.9 % IV SOLN
Freq: Once | INTRAVENOUS | Status: AC
  Administered 2015-12-30: 10:00:00 via INTRAVENOUS
  Filled 2015-12-30: qty 4

## 2015-12-30 MED ORDER — SODIUM CHLORIDE 0.9 % IV SOLN
Freq: Once | INTRAVENOUS | Status: AC
  Administered 2015-12-30: 09:00:00 via INTRAVENOUS
  Filled 2015-12-30: qty 1000

## 2015-12-30 MED ORDER — ACETAMINOPHEN 325 MG PO TABS
650.0000 mg | ORAL_TABLET | Freq: Once | ORAL | Status: AC
  Administered 2015-12-30: 650 mg via ORAL
  Filled 2015-12-30: qty 2

## 2016-01-04 ENCOUNTER — Other Ambulatory Visit: Payer: Self-pay | Admitting: *Deleted

## 2016-01-04 DIAGNOSIS — C9002 Multiple myeloma in relapse: Secondary | ICD-10-CM

## 2016-01-05 ENCOUNTER — Inpatient Hospital Stay: Payer: Medicare Other

## 2016-01-05 ENCOUNTER — Inpatient Hospital Stay (HOSPITAL_BASED_OUTPATIENT_CLINIC_OR_DEPARTMENT_OTHER): Payer: Medicare Other | Admitting: Hematology and Oncology

## 2016-01-05 VITALS — BP 112/75 | HR 108 | Temp 96.8°F | Resp 18 | Ht 67.0 in | Wt 165.6 lb

## 2016-01-05 DIAGNOSIS — Z8739 Personal history of other diseases of the musculoskeletal system and connective tissue: Secondary | ICD-10-CM | POA: Diagnosis not present

## 2016-01-05 DIAGNOSIS — C9002 Multiple myeloma in relapse: Secondary | ICD-10-CM

## 2016-01-05 DIAGNOSIS — I10 Essential (primary) hypertension: Secondary | ICD-10-CM

## 2016-01-05 DIAGNOSIS — Z5112 Encounter for antineoplastic immunotherapy: Secondary | ICD-10-CM | POA: Diagnosis not present

## 2016-01-05 DIAGNOSIS — M549 Dorsalgia, unspecified: Secondary | ICD-10-CM

## 2016-01-05 DIAGNOSIS — R197 Diarrhea, unspecified: Secondary | ICD-10-CM | POA: Diagnosis not present

## 2016-01-05 DIAGNOSIS — Z9484 Stem cells transplant status: Secondary | ICD-10-CM | POA: Diagnosis not present

## 2016-01-05 DIAGNOSIS — E785 Hyperlipidemia, unspecified: Secondary | ICD-10-CM

## 2016-01-05 DIAGNOSIS — Z7901 Long term (current) use of anticoagulants: Secondary | ICD-10-CM

## 2016-01-05 DIAGNOSIS — Z79899 Other long term (current) drug therapy: Secondary | ICD-10-CM

## 2016-01-05 DIAGNOSIS — R251 Tremor, unspecified: Secondary | ICD-10-CM

## 2016-01-05 DIAGNOSIS — G8929 Other chronic pain: Secondary | ICD-10-CM

## 2016-01-05 DIAGNOSIS — Z888 Allergy status to other drugs, medicaments and biological substances status: Secondary | ICD-10-CM

## 2016-01-05 LAB — COMPREHENSIVE METABOLIC PANEL
ALT: 18 U/L (ref 17–63)
AST: 14 U/L — ABNORMAL LOW (ref 15–41)
Albumin: 4 g/dL (ref 3.5–5.0)
Alkaline Phosphatase: 61 U/L (ref 38–126)
Anion gap: 3 — ABNORMAL LOW (ref 5–15)
BUN: 22 mg/dL — ABNORMAL HIGH (ref 6–20)
CO2: 26 mmol/L (ref 22–32)
Calcium: 8.9 mg/dL (ref 8.9–10.3)
Chloride: 100 mmol/L — ABNORMAL LOW (ref 101–111)
Creatinine, Ser: 1.33 mg/dL — ABNORMAL HIGH (ref 0.61–1.24)
GFR calc Af Amer: >60 mL/min (ref >60)
GFR calc non Af Amer: 53 mL/min — ABNORMAL LOW (ref >60)
Glucose, Bld: 141 mg/dL — ABNORMAL HIGH (ref 65–99)
Potassium: 4.1 mmol/L (ref 3.5–5.1)
Sodium: 129 mmol/L — ABNORMAL LOW (ref 135–145)
Total Bilirubin: 0.6 mg/dL (ref 0.3–1.2)
Total Protein: 6.2 g/dL — ABNORMAL LOW (ref 6.5–8.1)

## 2016-01-05 LAB — CBC WITH DIFFERENTIAL/PLATELET
Basophils Absolute: 0 10*3/uL (ref 0–0.1)
Basophils Relative: 1 %
Eosinophils Absolute: 0.1 10*3/uL (ref 0–0.7)
Eosinophils Relative: 1 %
HCT: 38 % — ABNORMAL LOW (ref 40.0–52.0)
Hemoglobin: 12.7 g/dL — ABNORMAL LOW (ref 13.0–18.0)
Lymphocytes Relative: 4 %
Lymphs Abs: 0.4 10*3/uL — ABNORMAL LOW (ref 1.0–3.6)
MCH: 33.7 pg (ref 26.0–34.0)
MCHC: 33.3 g/dL (ref 32.0–36.0)
MCV: 101.2 fL — ABNORMAL HIGH (ref 80.0–100.0)
Monocytes Absolute: 0.7 10*3/uL (ref 0.2–1.0)
Monocytes Relative: 9 %
Neutro Abs: 6.9 10*3/uL — ABNORMAL HIGH (ref 1.4–6.5)
Neutrophils Relative %: 85 %
Platelets: 169 10*3/uL (ref 150–440)
RBC: 3.76 MIL/uL — ABNORMAL LOW (ref 4.40–5.90)
RDW: 17.5 % — ABNORMAL HIGH (ref 11.5–14.5)
WBC: 8.1 10*3/uL (ref 3.8–10.6)

## 2016-01-05 LAB — MAGNESIUM: Magnesium: 1.9 mg/dL (ref 1.7–2.4)

## 2016-01-05 NOTE — Progress Notes (Signed)
No changes since last MD visit.  Reports treatments going well other than a runny nose

## 2016-01-05 NOTE — Progress Notes (Signed)
[facility] day:  01/05/2016  Chief Complaint: Logan Perez is a 70 y.o. 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  HPI: The patient was last seen in the medical oncology clinic on 12/15/2015.  At that time, he was seen prior to week #2 daratumumab.  He denied any complaint.    He has received daratumumab uneventfully on 12/09/2015, 12/16/2015, 12/23/2015, and 12/30/2015.  He denies any side effects.  He has taken his Singulair as scheduled.  He takes Decadron weekly.  He is on prophylactic acyclovir.  Symptomatically, he only notes a runny nose.  He notes diarrhea that comes and goes.  He relates it to eating at [facility] on Sundays as that is when it occurs.  He does not think it is due to lactose intolerance.   Past Medical History  Diagnosis Date  . Pulmonary embolism ([place])   . Stroke ([place])   . GERD (gastroesophageal reflux disease)   . Hypertension   . Atrial fibrillation ([place])   . HLD (hyperlipidemia)   . Multiple myeloma ([place])   . Elevated PSA   . BPH (benign prostatic hyperplasia)   . Difficulty voiding   . Anxiety     Past Surgical History  Procedure Laterality Date  . Back surgery  1994  . Knee arthroscopy Left 1992  . Pain pump implantation  2012  . Stem cell implant  2008    [facility]    Family History  Problem Relation Age of Onset  . Cancer Father     throat  . Bladder Cancer Neg Hx   . Kidney disease Sister   . Prostate cancer Neg Hx   . Stroke      Social History:  reports that he has quit smoking. His smoking use included Cigarettes. He has a 30 pack-year smoking history. He does not have any smokeless tobacco history on file. He reports that he does not drink alcohol or use illicit drugs.  The patient is accompanied by his wife, [name], today.  Allergies:  Allergies  Allergen Reactions  .  Azithromycin Diarrhea    Possible cause of C. Diff Possible cause of C. Diff  . 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 Rivaroxaban Rash    Current Medications: Current Outpatient Prescriptions  Medication Sig Dispense Refill  . ALPRAZolam (XANAX) 0.25 MG tablet TAKE 1 TABLET BY MOUTH EVERY NIGHT AT BEDTIME AS NEEDED FOR ANXIETY. 30 tablet 0  . apixaban (ELIQUIS) 5 MG TABS tablet Take 1 tablet (5 mg total) by mouth 2 (two) times daily. 60 tablet 1  . atorvastatin (LIPITOR) 10 MG tablet Take by mouth daily at 6 PM.     . baclofen (LIORESAL) 10 MG tablet Take 1 tablet (10 mg total) by mouth at bedtime. 30 each 3  . calcium citrate-vitamin D (CITRACAL+D) 315-200 MG-UNIT per tablet Take 1 tablet by mouth daily.     . cetirizine (ZYRTEC) 10 MG tablet Take 10 mg by mouth daily.     . Cholecalciferol (VITAMIN D3) 2000 UNITS capsule Take 2,000 Units by mouth daily.     . Cyanocobalamin (RA VITAMIN B-12 TR) 1000 MCG TBCR Take 1,000 mcg by mouth daily.     [name]  dexamethasone (DECADRON) 4 MG tablet Take 16 mg (four 4 pills) on the day of treatment and 4 mg (1 pill) the day after treatment 20 tablet 2  . diltiazem (CARDIZEM CD) 120 MG 24 hr capsule TAKE 1 CAPSULE ONCE DAILY    . fluticasone (FLONASE) 50 MCG/ACT nasal spray Place into the nose.    . ibuprofen (ADVIL,MOTRIN) 200 MG tablet Take 200 mg by mouth every 6 (six) hours as needed for mild pain.     . Iron-Vitamins (GERITOL PO) Take by mouth.    . loperamide (IMODIUM) 2 MG capsule Take 2 mg by mouth as needed for diarrhea or loose stools.     . montelukast (SINGULAIR) 10 MG tablet Take once a day as directed by MD (per treatment schedule). (Patient taking differently: Take 10 mg by mouth at bedtime. Take once a day as directed by MD (per treatment schedule).) 30 tablet 1  .  omeprazole (PRILOSEC) 20 MG capsule Take 20 mg by mouth daily.     [name] PAIN MANAGEMENT IT PUMP REFILL 1 each by Intrathecal route once. Medication: PF Fentanyl 1,500.0 mcg/ml PF Bupivicaine 30.0 mg/ml PF Clonidine 300.3mg/ml Total Volume: 40 ml Needed by 12-08-15 '@1400'  1 each 0  . potassium chloride SA (K-DUR,KLOR-CON) 20 MEQ tablet TAKE 1 TABLET(20 MEQ) BY MOUTH TWICE DAILY 180 tablet 0  . Probiotic Product ([place] CAPS Take 1 capsule by mouth daily.    . ranitidine (ZANTAC) 150 MG tablet Take 150 mg by mouth.    . tamsulosin (FLOMAX) 0.4 MG CAPS capsule Take 1 capsule (0.4 mg total) by mouth daily. 90 capsule 3  . valACYclovir (VALTREX) 500 MG tablet Take 1 tablet (500 mg total) by mouth daily. 30 tablet 3   No current facility-administered medications for this visit.    Review of Systems:  GENERAL:  Energy level is good.  No fevers or sweats .  Weight up 2 pounds. PERFORMANCE STATUS (ECOG): 1 HEENT:  Cataracts. Runny nose.  No sore throat, mouth sores or tenderness. Lungs: No shortness of breath or cough.  No hemoptysis. Cardiac:  No chest pain, palpitations, orthopnea, or PND. GI:  Diarrhea, comes and goes.  Appetite good.  No nausea, vomiting, diarrhea, constipation, melena or hematochezia. GU:  No urgency, frequency, dysuria, or hematuria. Musculoskeletal:  Compression fracture of back on a pain pump.  No joint pain.  No muscle tenderness. Extremities:  No pain or swelling. Skin:  Fragile skin.  No rashes or skin changes. Neuro:  Little tremor (chronic).  Neuropathy in feet.  No headache, weakness, balance or coordination issues. Endocrine:  No diabetes, thyroid issues, hot flashes or night sweats. Psych:  No mood changes, depression or anxiety. Pain:  Chronic back pain (pain well managed with pump). Review of systems:  All other systems reviewed and found to be negative.   Physical Exam: Blood pressure 112/75, pulse 108, temperature 96.8 F (36 C), temperature  source Tympanic, resp. rate 18, height '5\' 7"'  (1.702 m), weight 165 lb 9.1 oz (75.1 kg). GENERAL:  Well developed, well nourished, sitting comfortably in the exam room in no acute distress. MENTAL STATUS:  Alert and oriented to person, place and time. HEAD:  GPearline Cablesshort hair with goatee.  Normocephalic, atraumatic, face symmetric, no Cushingoid features. EYES:  Glasses.  Blue eyes.  Pupils equal round and reactive to light and accomodation.  No conjunctivitis or scleral icterus. ENT:  Oropharynx clear without lesion.  Hearing aide.  Tongue normal. Mucous membranes moist.  RESPIRATORY:  Clear to auscultation without rales, wheezes or rhonchi. CARDIOVASCULAR:  Regular rate and rhythm without murmur, rub or gallop. ABDOMEN:  RUQ pain pump.  Soft, non-tender, with active bowel sounds, and no hepatosplenomegaly.  No masses. SKIN:  No rashes, ulcers or lesions. EXTREMITIES: No edema, no skin discoloration or tenderness.  No palpable cords. LYMPH NODES: No palpable cervical, supraclavicular, axillary or inguinal adenopathy  NEUROLOGICAL: Intention tremor. PSYCH:  Appropriate.   Appointment on 01/05/2016  Component Date Value Ref Range Status  . Magnesium 01/05/2016 1.9  1.7 - 2.4 mg/dL Final  . WBC 01/05/2016 8.1  3.8 - 10.6 K/uL Final  . RBC 01/05/2016 3.76* 4.40 - 5.90 MIL/uL Final  . Hemoglobin 01/05/2016 12.7* 13.0 - 18.0 g/dL Final  . HCT 01/05/2016 38.0* 40.0 - 52.0 % Final  . MCV 01/05/2016 101.2* 80.0 - 100.0 fL Final  . MCH 01/05/2016 33.7  26.0 - 34.0 pg Final  . MCHC 01/05/2016 33.3  32.0 - 36.0 g/dL Final  . RDW 01/05/2016 17.5* 11.5 - 14.5 % Final  . Platelets 01/05/2016 169  150 - 440 K/uL Final  . Neutrophils Relative % 01/05/2016 85   Final  . Neutro Abs 01/05/2016 6.9* 1.4 - 6.5 K/uL Final  . Lymphocytes Relative 01/05/2016 4   Final  . Lymphs Abs 01/05/2016 0.4* 1.0 - 3.6 K/uL Final  . Monocytes Relative 01/05/2016 9   Final  . Monocytes Absolute 01/05/2016 0.7  0.2 - 1.0  K/uL Final  . Eosinophils Relative 01/05/2016 1   Final  . Eosinophils Absolute 01/05/2016 0.1  0 - 0.7 K/uL Final  . Basophils Relative 01/05/2016 1   Final  . Basophils Absolute 01/05/2016 0.0  0 - 0.1 K/uL Final  . Sodium 01/05/2016 129* 135 - 145 mmol/L Final  . Potassium 01/05/2016 4.1  3.5 - 5.1 mmol/L Final  . Chloride 01/05/2016 100* 101 - 111 mmol/L Final  . CO2 01/05/2016 26  22 - 32 mmol/L Final  . Glucose, Bld 01/05/2016 141* 65 - 99 mg/dL Final  . BUN 01/05/2016 22* 6 - 20 mg/dL Final  . Creatinine, Ser 01/05/2016 1.33* 0.61 - 1.24 mg/dL Final  . Calcium 01/05/2016 8.9  8.9 - 10.3 mg/dL Final  . Total Protein 01/05/2016 6.2* 6.5 - 8.1 g/dL Final  . Albumin 01/05/2016 4.0  3.5 - 5.0 g/dL Final  . AST 01/05/2016 14* 15 - 41 U/L Final  . ALT 01/05/2016 18  17 - 63 U/L Final  . Alkaline Phosphatase 01/05/2016 61  38 - 126 U/L Final  . Total Bilirubin 01/05/2016 0.6  0.3 - 1.2 mg/dL Final  . GFR calc non Af Amer 01/05/2016 53* >60 mL/min Final  . GFR calc Af Amer 01/05/2016 >60  >60 mL/min Final   Comment: (NOTE) The eGFR has been calculated using the CKD EPI equation. This calculation has not been validated in all clinical situations. eGFR's persistently <60 mL/min signify possible Chronic Kidney Disease.   Georgiann Hahn gap 01/05/2016 3* 5 - 15 Final    Assessment:  Logan Perez is a 70 y.o. male with stage III mutiple myeloma.  He initially presented with progressive back pain beginning in 12/2006.  MRI revealed "spots and compression fractures".  He began Velcade, thalidomide, and Decadron.  In 08/2007, he underwent high dose chemotherapy and autologous stem cell transplant.  He recurred with a rising M-spike (2.7) with repeat M spike (1.7 gm/dl) in 03/2010.  He was initially treated with Velcade (02/08/2010 - 05/10/2010).  He then began Revlimid (15 mg 3 weeks on/1 week off) and Decadron (40 mg on day 1, 8, 15, 22).  Because of significant side effect with Decadron  his dose was decreased to 10 mg once a week in 07/2010.    He was on maintenance Revlimid. Revlimid was initially10 mg 3 weeks on/1 week off. This was changed to 10 mg 2 weeks on/2 weeks off secondary to right nipple tenderness. His dose was increased to 10 mg 3 weeks on/1 week off with Decadron 10 mg a week (on Sundays) and then Revlamid 15 mg 3 weeks on and 1 week off with Decadron on Sundays.  He began Pomalyst 4 mg 3 weeks on/1 week off with Decadron on 08/27/2015.  Over the past year his SPEP has revealed no monoclonal protein (04/21/2015) and 0.5 gm/dL on 09/22/2015 and 10/20/2015. Free light chains have been monitored. Kappa free light chains were 18.54 on 11/21/2013, 18.37 on 02/20/2014, 18.93 on 05/22/2014, 32.58 (high; normal ratio 1.27) on 08/21/2014, 51.53 (high; elevated ratio of 2.12) on 11/13/2014, 28.08 (ratio 1.73) on 12/09/2014, 23.71 (ratio 2.17) on 01/18/2015, 92.93 (ratio 9.49) on 04/21/2015, 93.44 (ratio 10.28) on 05/26/2015, 255.45 (ratio of 24.05) on 07/14/2015, 373.89 (ratio 48.31) on 08/04/2015, 474.33 (ratio 70.58) on 08/25/2015, 450.76 (ratio 55.44) on 09/22/2015, and 453.4 (ratio 59.89) on 10/20/2015.   Bone survey on 12/08/2014 was stable.  Bone survey on 10/21/2015 revealed increase conspicuity of subcentimeter lytic lesions in the calvarium.  Bone marrow aspirate and biopsy on 11/04/2015 revealed an atypical monoclonal plasma cells estimated at 30-40% of marrow cells.   Marrow was variably cellular (approximately 45%) with background trilineage hematopoiesis. There was no significant increase in marrow reticulin fibers. Storage iron was present.    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 He develoed herpes zoster in 04/2008. He developed a pulmonary embolism in 05/2013. He was initially on Xarelto, but is now on Eliquis. He had an episode of pneumonia around this time requiring a brief admission. He  developed severe lower leg cramps on 08/07/2014 secondary to hypokalemia. Duplex was negative.   He was treated for C difficile colitis (Flagyl completed 07/30/2015).  He has a chronic indwelling pain pump.  He received 4 cycles of Pomalyst and Decadron (08/27/2015 - 11/19/2015).  Restaging studies document progressive disease.  Kappa free light chains are increasing.  SPEP revealed 0.5 gm/dL monoclonal protein then 1.3 gm/dL.  Bone survey reveals increase conspicuity of subcentimeter lytic lesions in the calvarium.  Bone marrow reveals 30-40% plasma cells.   MUGA on 11/30/2015 revealed an ejection fraction of 46%.  He is felt not to be a good candidate for Kyprolis.  There were no focal wall motion abnormalities.  He had a stress echo less than 1 year ago.  He has a history of PVCs and atrial fibrillation.  He takes Cardizem prn.  He has met with the bone marrow transplant team at [facility] with the plan for salvage chemotherapy and consideration of a second autologous stem cell transplant.  He has frozen stem cells from his first transplant.  He is s/p 4 weeks of daratumumab (Darzalex) (cycle #1: 12/09/2015 - 12/30/2015).  He is tolerating treatment well without side effect   Symptomatically, he has intermittent diarrhea (appears food related).  Exam is stable.   Plan:  1.  Labs today:  CBC with diff, CMP, Mg, SPEP, FLCA. 2.  Week #5 daratumumab tomorrow in [place]. 3.  RTC weekly x  3 on Wednesdays in [place] for labs (CBC with diff, CMP, Mg). 4.  RTC weekly x 3 on Thursdays in [place] for daratumumab. 5,  RTC in 4 weeks in [place] for MD assessment, labs (CBC with diff, CMP, Mg, SPEP, FLCA) prior to cycle #3 on 02/03/2016 in [place].Lequita Asal, MD  01/05/2016

## 2016-01-06 ENCOUNTER — Encounter: Payer: Self-pay | Admitting: Hematology and Oncology

## 2016-01-06 ENCOUNTER — Inpatient Hospital Stay: Payer: Medicare Other

## 2016-01-06 ENCOUNTER — Other Ambulatory Visit: Payer: Self-pay | Admitting: Hematology and Oncology

## 2016-01-06 VITALS — BP 106/72 | HR 94 | Temp 96.3°F | Resp 20

## 2016-01-06 DIAGNOSIS — C9002 Multiple myeloma in relapse: Secondary | ICD-10-CM

## 2016-01-06 DIAGNOSIS — Z5112 Encounter for antineoplastic immunotherapy: Secondary | ICD-10-CM | POA: Diagnosis not present

## 2016-01-06 LAB — PROTEIN ELECTROPHORESIS, SERUM
A/G Ratio: 1.6 (ref 0.7–1.7)
Albumin ELP: 3.5 g/dL (ref 2.9–4.4)
Alpha-1-Globulin: 0.1 g/dL (ref 0.0–0.4)
Alpha-2-Globulin: 0.8 g/dL (ref 0.4–1.0)
Beta Globulin: 1.1 g/dL (ref 0.7–1.3)
Gamma Globulin: 0.2 g/dL — ABNORMAL LOW (ref 0.4–1.8)
Globulin, Total: 2.2 g/dL (ref 2.2–3.9)
M-Spike, %: 0.1 g/dL — ABNORMAL HIGH
Total Protein ELP: 5.7 g/dL — ABNORMAL LOW (ref 6.0–8.5)

## 2016-01-06 LAB — KAPPA/LAMBDA LIGHT CHAINS
Kappa free light chain: 42.6 mg/L — ABNORMAL HIGH (ref 3.30–19.40)
Kappa, lambda light chain ratio: 28.21 — ABNORMAL HIGH (ref 0.26–1.65)
Lambda free light chains: 1.51 mg/L — ABNORMAL LOW (ref 5.71–26.30)

## 2016-01-06 MED ORDER — DARATUMUMAB CHEMO INJECTION 400 MG/20ML
1200.0000 mg | Freq: Once | INTRAVENOUS | Status: AC
  Administered 2016-01-06: 1200 mg via INTRAVENOUS
  Filled 2016-01-06: qty 60

## 2016-01-06 MED ORDER — ACETAMINOPHEN 325 MG PO TABS
650.0000 mg | ORAL_TABLET | Freq: Once | ORAL | Status: AC
  Administered 2016-01-06: 650 mg via ORAL
  Filled 2016-01-06: qty 2

## 2016-01-06 MED ORDER — SODIUM CHLORIDE 0.9 % IV SOLN
Freq: Once | INTRAVENOUS | Status: AC
  Administered 2016-01-06: 10:00:00 via INTRAVENOUS
  Filled 2016-01-06: qty 1000

## 2016-01-06 MED ORDER — DIPHENHYDRAMINE HCL 25 MG PO CAPS
50.0000 mg | ORAL_CAPSULE | Freq: Once | ORAL | Status: AC
Start: 2016-01-06 — End: 2016-01-06
  Administered 2016-01-06: 50 mg via ORAL
  Filled 2016-01-06: qty 2

## 2016-01-06 MED ORDER — SODIUM CHLORIDE 0.9 % IV SOLN
Freq: Once | INTRAVENOUS | Status: AC
  Administered 2016-01-06: 10:00:00 via INTRAVENOUS
  Filled 2016-01-06: qty 4

## 2016-01-07 ENCOUNTER — Telehealth: Payer: Self-pay | Admitting: *Deleted

## 2016-01-07 NOTE — Telephone Encounter (Signed)
Lab light chain and m-spike results given to patient and informed per Dr. Mike Gip that is responding well to treatment. Also, informed pt that Dr. Mike Gip would like to repeat bone marrow biopsy in about 1 month but will discuss this with pt at next visit. Pt verbalized understanding and thanked me for calling him with the good news.

## 2016-01-12 ENCOUNTER — Inpatient Hospital Stay: Payer: Medicare Other | Attending: Hematology and Oncology

## 2016-01-12 DIAGNOSIS — N400 Enlarged prostate without lower urinary tract symptoms: Secondary | ICD-10-CM | POA: Insufficient documentation

## 2016-01-12 DIAGNOSIS — Z7952 Long term (current) use of systemic steroids: Secondary | ICD-10-CM | POA: Diagnosis not present

## 2016-01-12 DIAGNOSIS — Z5112 Encounter for antineoplastic immunotherapy: Secondary | ICD-10-CM | POA: Insufficient documentation

## 2016-01-12 DIAGNOSIS — R972 Elevated prostate specific antigen [PSA]: Secondary | ICD-10-CM | POA: Diagnosis not present

## 2016-01-12 DIAGNOSIS — K219 Gastro-esophageal reflux disease without esophagitis: Secondary | ICD-10-CM | POA: Insufficient documentation

## 2016-01-12 DIAGNOSIS — Z8673 Personal history of transient ischemic attack (TIA), and cerebral infarction without residual deficits: Secondary | ICD-10-CM | POA: Insufficient documentation

## 2016-01-12 DIAGNOSIS — I4891 Unspecified atrial fibrillation: Secondary | ICD-10-CM | POA: Diagnosis not present

## 2016-01-12 DIAGNOSIS — C9002 Multiple myeloma in relapse: Secondary | ICD-10-CM | POA: Insufficient documentation

## 2016-01-12 DIAGNOSIS — E785 Hyperlipidemia, unspecified: Secondary | ICD-10-CM | POA: Diagnosis not present

## 2016-01-12 DIAGNOSIS — I10 Essential (primary) hypertension: Secondary | ICD-10-CM | POA: Insufficient documentation

## 2016-01-12 DIAGNOSIS — Z87891 Personal history of nicotine dependence: Secondary | ICD-10-CM | POA: Insufficient documentation

## 2016-01-12 DIAGNOSIS — Z79899 Other long term (current) drug therapy: Secondary | ICD-10-CM | POA: Insufficient documentation

## 2016-01-12 DIAGNOSIS — Z9484 Stem cells transplant status: Secondary | ICD-10-CM | POA: Insufficient documentation

## 2016-01-12 DIAGNOSIS — Z86711 Personal history of pulmonary embolism: Secondary | ICD-10-CM | POA: Insufficient documentation

## 2016-01-12 LAB — COMPREHENSIVE METABOLIC PANEL
ALT: 17 U/L (ref 17–63)
AST: 14 U/L — ABNORMAL LOW (ref 15–41)
Albumin: 3.7 g/dL (ref 3.5–5.0)
Alkaline Phosphatase: 58 U/L (ref 38–126)
Anion gap: 9 (ref 5–15)
BUN: 17 mg/dL (ref 6–20)
CO2: 24 mmol/L (ref 22–32)
Calcium: 9.5 mg/dL (ref 8.9–10.3)
Chloride: 104 mmol/L (ref 101–111)
Creatinine, Ser: 1.41 mg/dL — ABNORMAL HIGH (ref 0.61–1.24)
GFR calc Af Amer: 57 mL/min — ABNORMAL LOW (ref >60)
GFR calc non Af Amer: 49 mL/min — ABNORMAL LOW (ref >60)
Glucose, Bld: 127 mg/dL — ABNORMAL HIGH (ref 65–99)
Potassium: 4.5 mmol/L (ref 3.5–5.1)
Sodium: 137 mmol/L (ref 135–145)
Total Bilirubin: 0.7 mg/dL (ref 0.3–1.2)
Total Protein: 6.1 g/dL — ABNORMAL LOW (ref 6.5–8.1)

## 2016-01-12 LAB — CBC WITH DIFFERENTIAL/PLATELET
Basophils Absolute: 0.1 10*3/uL (ref 0–0.1)
Basophils Relative: 1 %
Eosinophils Absolute: 0.1 10*3/uL (ref 0–0.7)
Eosinophils Relative: 1 %
HCT: 40.3 % (ref 40.0–52.0)
Hemoglobin: 13.4 g/dL (ref 13.0–18.0)
Lymphocytes Relative: 4 %
Lymphs Abs: 0.4 10*3/uL — ABNORMAL LOW (ref 1.0–3.6)
MCH: 33.8 pg (ref 26.0–34.0)
MCHC: 33.2 g/dL (ref 32.0–36.0)
MCV: 101.7 fL — ABNORMAL HIGH (ref 80.0–100.0)
Monocytes Absolute: 0.8 10*3/uL (ref 0.2–1.0)
Monocytes Relative: 8 %
Neutro Abs: 8.2 10*3/uL — ABNORMAL HIGH (ref 1.4–6.5)
Neutrophils Relative %: 86 %
Platelets: 211 10*3/uL (ref 150–440)
RBC: 3.97 MIL/uL — ABNORMAL LOW (ref 4.40–5.90)
RDW: 17.7 % — ABNORMAL HIGH (ref 11.5–14.5)
WBC: 9.5 10*3/uL (ref 3.8–10.6)

## 2016-01-12 LAB — MAGNESIUM: Magnesium: 1.8 mg/dL (ref 1.7–2.4)

## 2016-01-13 ENCOUNTER — Other Ambulatory Visit: Payer: Self-pay | Admitting: Hematology and Oncology

## 2016-01-14 ENCOUNTER — Inpatient Hospital Stay: Payer: Medicare Other

## 2016-01-14 ENCOUNTER — Other Ambulatory Visit: Payer: Self-pay | Admitting: Hematology and Oncology

## 2016-01-14 VITALS — BP 122/86 | HR 90 | Temp 96.1°F | Resp 20

## 2016-01-14 DIAGNOSIS — Z5112 Encounter for antineoplastic immunotherapy: Secondary | ICD-10-CM | POA: Diagnosis not present

## 2016-01-14 DIAGNOSIS — C9002 Multiple myeloma in relapse: Secondary | ICD-10-CM

## 2016-01-14 MED ORDER — SODIUM CHLORIDE 0.9 % IV SOLN
Freq: Once | INTRAVENOUS | Status: AC
  Administered 2016-01-14: 10:00:00 via INTRAVENOUS
  Filled 2016-01-14: qty 4

## 2016-01-14 MED ORDER — SODIUM CHLORIDE 0.9 % IV SOLN
Freq: Once | INTRAVENOUS | Status: AC
  Administered 2016-01-14: 09:00:00 via INTRAVENOUS
  Filled 2016-01-14: qty 1000

## 2016-01-14 MED ORDER — DIPHENHYDRAMINE HCL 25 MG PO CAPS
50.0000 mg | ORAL_CAPSULE | Freq: Once | ORAL | Status: AC
  Administered 2016-01-14: 50 mg via ORAL
  Filled 2016-01-14: qty 2

## 2016-01-14 MED ORDER — SODIUM CHLORIDE 0.9 % IV SOLN
1200.0000 mg | Freq: Once | INTRAVENOUS | Status: AC
  Administered 2016-01-14: 1200 mg via INTRAVENOUS
  Filled 2016-01-14: qty 60

## 2016-01-14 MED ORDER — ACETAMINOPHEN 325 MG PO TABS
650.0000 mg | ORAL_TABLET | Freq: Once | ORAL | Status: AC
  Administered 2016-01-14: 650 mg via ORAL
  Filled 2016-01-14: qty 2

## 2016-01-14 NOTE — Progress Notes (Signed)
Per Dr Mike Gip, pt does not need IV solumedrol before treatment. He takes oral steroids at home 1hr before coming in for treatment.

## 2016-01-18 ENCOUNTER — Encounter: Payer: Self-pay | Admitting: Pain Medicine

## 2016-01-19 ENCOUNTER — Other Ambulatory Visit: Payer: Self-pay | Admitting: Hematology and Oncology

## 2016-01-19 ENCOUNTER — Other Ambulatory Visit: Payer: Self-pay | Admitting: *Deleted

## 2016-01-19 ENCOUNTER — Inpatient Hospital Stay: Payer: Medicare Other

## 2016-01-19 DIAGNOSIS — Z5112 Encounter for antineoplastic immunotherapy: Secondary | ICD-10-CM | POA: Diagnosis not present

## 2016-01-19 DIAGNOSIS — C9002 Multiple myeloma in relapse: Secondary | ICD-10-CM

## 2016-01-19 LAB — COMPREHENSIVE METABOLIC PANEL
ALT: 20 U/L (ref 17–63)
AST: 15 U/L (ref 15–41)
Albumin: 3.7 g/dL (ref 3.5–5.0)
Alkaline Phosphatase: 52 U/L (ref 38–126)
Anion gap: 6 (ref 5–15)
BUN: 18 mg/dL (ref 6–20)
CO2: 23 mmol/L (ref 22–32)
Calcium: 9.3 mg/dL (ref 8.9–10.3)
Chloride: 105 mmol/L (ref 101–111)
Creatinine, Ser: 1.15 mg/dL (ref 0.61–1.24)
GFR calc Af Amer: >60 mL/min (ref >60)
GFR calc non Af Amer: >60 mL/min (ref >60)
Glucose, Bld: 100 mg/dL — ABNORMAL HIGH (ref 65–99)
Potassium: 4.1 mmol/L (ref 3.5–5.1)
Sodium: 134 mmol/L — ABNORMAL LOW (ref 135–145)
Total Bilirubin: 0.7 mg/dL (ref 0.3–1.2)
Total Protein: 6 g/dL — ABNORMAL LOW (ref 6.5–8.1)

## 2016-01-19 LAB — CBC WITH DIFFERENTIAL/PLATELET
Basophils Absolute: 0.1 10*3/uL (ref 0–0.1)
Basophils Relative: 1 %
Eosinophils Absolute: 0.1 10*3/uL (ref 0–0.7)
Eosinophils Relative: 1 %
HCT: 36.4 % — ABNORMAL LOW (ref 40.0–52.0)
Hemoglobin: 12.2 g/dL — ABNORMAL LOW (ref 13.0–18.0)
Lymphocytes Relative: 5 %
Lymphs Abs: 0.5 10*3/uL — ABNORMAL LOW (ref 1.0–3.6)
MCH: 33.8 pg (ref 26.0–34.0)
MCHC: 33.6 g/dL (ref 32.0–36.0)
MCV: 100.4 fL — ABNORMAL HIGH (ref 80.0–100.0)
Monocytes Absolute: 1 10*3/uL (ref 0.2–1.0)
Monocytes Relative: 11 %
Neutro Abs: 7.3 10*3/uL — ABNORMAL HIGH (ref 1.4–6.5)
Neutrophils Relative %: 82 %
Platelets: 175 10*3/uL (ref 150–440)
RBC: 3.63 MIL/uL — ABNORMAL LOW (ref 4.40–5.90)
RDW: 17.3 % — ABNORMAL HIGH (ref 11.5–14.5)
WBC: 8.9 10*3/uL (ref 3.8–10.6)

## 2016-01-19 LAB — MAGNESIUM: Magnesium: 1.8 mg/dL (ref 1.7–2.4)

## 2016-01-19 MED ORDER — ALPRAZOLAM 0.25 MG PO TABS: ORAL_TABLET | ORAL | Status: DC

## 2016-01-19 NOTE — Telephone Encounter (Signed)
This will need to be printed and faxed or called in please

## 2016-01-20 ENCOUNTER — Inpatient Hospital Stay: Payer: Medicare Other

## 2016-01-21 ENCOUNTER — Inpatient Hospital Stay: Payer: Medicare Other

## 2016-01-21 ENCOUNTER — Other Ambulatory Visit: Payer: Self-pay | Admitting: Hematology and Oncology

## 2016-01-21 VITALS — BP 134/83 | HR 85 | Temp 96.0°F | Resp 20

## 2016-01-21 DIAGNOSIS — Z5112 Encounter for antineoplastic immunotherapy: Secondary | ICD-10-CM | POA: Diagnosis not present

## 2016-01-21 DIAGNOSIS — C9002 Multiple myeloma in relapse: Secondary | ICD-10-CM

## 2016-01-21 MED ORDER — SODIUM CHLORIDE 0.9 % IV SOLN
Freq: Once | INTRAVENOUS | Status: AC
  Administered 2016-01-21: 10:00:00 via INTRAVENOUS
  Filled 2016-01-21: qty 4

## 2016-01-21 MED ORDER — DIPHENHYDRAMINE HCL 25 MG PO CAPS
50.0000 mg | ORAL_CAPSULE | Freq: Once | ORAL | Status: AC
  Administered 2016-01-21: 50 mg via ORAL
  Filled 2016-01-21: qty 2

## 2016-01-21 MED ORDER — ACETAMINOPHEN 325 MG PO TABS
650.0000 mg | ORAL_TABLET | Freq: Once | ORAL | Status: AC
  Administered 2016-01-21: 650 mg via ORAL
  Filled 2016-01-21: qty 2

## 2016-01-21 MED ORDER — SODIUM CHLORIDE 0.9 % IV SOLN
1200.0000 mg | Freq: Once | INTRAVENOUS | Status: AC
  Administered 2016-01-21: 1200 mg via INTRAVENOUS
  Filled 2016-01-21: qty 60

## 2016-01-21 MED ORDER — SODIUM CHLORIDE 0.9 % IV SOLN
Freq: Once | INTRAVENOUS | Status: AC
  Administered 2016-01-21: 10:00:00 via INTRAVENOUS
  Filled 2016-01-21: qty 1000

## 2016-01-26 ENCOUNTER — Inpatient Hospital Stay: Payer: Medicare Other

## 2016-01-26 DIAGNOSIS — C9002 Multiple myeloma in relapse: Secondary | ICD-10-CM

## 2016-01-26 DIAGNOSIS — Z5112 Encounter for antineoplastic immunotherapy: Secondary | ICD-10-CM | POA: Diagnosis not present

## 2016-01-26 LAB — COMPREHENSIVE METABOLIC PANEL
ALT: 20 U/L (ref 17–63)
AST: 15 U/L (ref 15–41)
Albumin: 3.9 g/dL (ref 3.5–5.0)
Alkaline Phosphatase: 61 U/L (ref 38–126)
Anion gap: 5 (ref 5–15)
BUN: 21 mg/dL — ABNORMAL HIGH (ref 6–20)
CO2: 25 mmol/L (ref 22–32)
Calcium: 9.1 mg/dL (ref 8.9–10.3)
Chloride: 102 mmol/L (ref 101–111)
Creatinine, Ser: 1.42 mg/dL — ABNORMAL HIGH (ref 0.61–1.24)
GFR calc Af Amer: 57 mL/min — ABNORMAL LOW (ref >60)
GFR calc non Af Amer: 49 mL/min — ABNORMAL LOW (ref >60)
Glucose, Bld: 134 mg/dL — ABNORMAL HIGH (ref 65–99)
Potassium: 3.8 mmol/L (ref 3.5–5.1)
Sodium: 132 mmol/L — ABNORMAL LOW (ref 135–145)
Total Bilirubin: 0.9 mg/dL (ref 0.3–1.2)
Total Protein: 6.5 g/dL (ref 6.5–8.1)

## 2016-01-26 LAB — CBC WITH DIFFERENTIAL/PLATELET
Basophils Absolute: 0.1 10*3/uL (ref 0–0.1)
Basophils Relative: 1 %
Eosinophils Absolute: 0.1 10*3/uL (ref 0–0.7)
Eosinophils Relative: 1 %
HCT: 40 % (ref 40.0–52.0)
Hemoglobin: 13.6 g/dL (ref 13.0–18.0)
Lymphocytes Relative: 4 %
Lymphs Abs: 0.4 10*3/uL — ABNORMAL LOW (ref 1.0–3.6)
MCH: 33.9 pg (ref 26.0–34.0)
MCHC: 34 g/dL (ref 32.0–36.0)
MCV: 99.6 fL (ref 80.0–100.0)
Monocytes Absolute: 0.8 10*3/uL (ref 0.2–1.0)
Monocytes Relative: 8 %
Neutro Abs: 8.6 10*3/uL — ABNORMAL HIGH (ref 1.4–6.5)
Neutrophils Relative %: 86 %
Platelets: 203 10*3/uL (ref 150–440)
RBC: 4.01 MIL/uL — ABNORMAL LOW (ref 4.40–5.90)
RDW: 16.8 % — ABNORMAL HIGH (ref 11.5–14.5)
WBC: 9.9 10*3/uL (ref 3.8–10.6)

## 2016-01-26 LAB — MAGNESIUM: Magnesium: 2 mg/dL (ref 1.7–2.4)

## 2016-01-27 ENCOUNTER — Inpatient Hospital Stay: Payer: Medicare Other

## 2016-01-28 ENCOUNTER — Inpatient Hospital Stay: Payer: Medicare Other

## 2016-01-28 ENCOUNTER — Other Ambulatory Visit: Payer: Self-pay | Admitting: Hematology and Oncology

## 2016-01-28 VITALS — BP 124/83 | HR 91 | Temp 97.7°F | Resp 20

## 2016-01-28 DIAGNOSIS — C9002 Multiple myeloma in relapse: Secondary | ICD-10-CM

## 2016-01-28 DIAGNOSIS — Z5112 Encounter for antineoplastic immunotherapy: Secondary | ICD-10-CM | POA: Diagnosis not present

## 2016-01-28 MED ORDER — SODIUM CHLORIDE 0.9 % IV SOLN
1200.0000 mg | Freq: Once | INTRAVENOUS | Status: AC
  Administered 2016-01-28: 1200 mg via INTRAVENOUS
  Filled 2016-01-28: qty 60

## 2016-01-28 MED ORDER — ACETAMINOPHEN 325 MG PO TABS
650.0000 mg | ORAL_TABLET | Freq: Once | ORAL | Status: AC
  Administered 2016-01-28: 650 mg via ORAL
  Filled 2016-01-28: qty 2

## 2016-01-28 MED ORDER — SODIUM CHLORIDE 0.9 % IV SOLN
Freq: Once | INTRAVENOUS | Status: AC
  Administered 2016-01-28: 10:00:00 via INTRAVENOUS
  Filled 2016-01-28: qty 4

## 2016-01-28 MED ORDER — DIPHENHYDRAMINE HCL 25 MG PO CAPS
50.0000 mg | ORAL_CAPSULE | Freq: Once | ORAL | Status: AC
  Administered 2016-01-28: 50 mg via ORAL
  Filled 2016-01-28: qty 2

## 2016-01-28 MED ORDER — SODIUM CHLORIDE 0.9 % IV SOLN
Freq: Once | INTRAVENOUS | Status: AC
Start: 2016-01-28 — End: 2016-01-28
  Administered 2016-01-28: 10:00:00 via INTRAVENOUS
  Filled 2016-01-28: qty 1000

## 2016-01-31 ENCOUNTER — Encounter: Payer: Self-pay | Admitting: Pain Medicine

## 2016-02-02 ENCOUNTER — Inpatient Hospital Stay: Payer: Medicare Other

## 2016-02-02 ENCOUNTER — Telehealth: Payer: Self-pay | Admitting: *Deleted

## 2016-02-02 ENCOUNTER — Inpatient Hospital Stay (HOSPITAL_BASED_OUTPATIENT_CLINIC_OR_DEPARTMENT_OTHER): Payer: Medicare Other | Admitting: Hematology and Oncology

## 2016-02-02 VITALS — BP 100/67 | HR 68 | Temp 97.1°F | Wt 167.1 lb

## 2016-02-02 DIAGNOSIS — C9002 Multiple myeloma in relapse: Secondary | ICD-10-CM

## 2016-02-02 DIAGNOSIS — Z9484 Stem cells transplant status: Secondary | ICD-10-CM | POA: Diagnosis not present

## 2016-02-02 DIAGNOSIS — Z9221 Personal history of antineoplastic chemotherapy: Secondary | ICD-10-CM

## 2016-02-02 DIAGNOSIS — M5489 Other dorsalgia: Secondary | ICD-10-CM

## 2016-02-02 DIAGNOSIS — Z87311 Personal history of (healed) other pathological fracture: Secondary | ICD-10-CM

## 2016-02-02 DIAGNOSIS — G8929 Other chronic pain: Secondary | ICD-10-CM

## 2016-02-02 DIAGNOSIS — Z5112 Encounter for antineoplastic immunotherapy: Secondary | ICD-10-CM | POA: Diagnosis not present

## 2016-02-02 DIAGNOSIS — K219 Gastro-esophageal reflux disease without esophagitis: Secondary | ICD-10-CM

## 2016-02-02 DIAGNOSIS — I4891 Unspecified atrial fibrillation: Secondary | ICD-10-CM

## 2016-02-02 DIAGNOSIS — R197 Diarrhea, unspecified: Secondary | ICD-10-CM

## 2016-02-02 DIAGNOSIS — E785 Hyperlipidemia, unspecified: Secondary | ICD-10-CM

## 2016-02-02 DIAGNOSIS — I10 Essential (primary) hypertension: Secondary | ICD-10-CM

## 2016-02-02 DIAGNOSIS — G629 Polyneuropathy, unspecified: Secondary | ICD-10-CM

## 2016-02-02 LAB — CBC WITH DIFFERENTIAL/PLATELET
Basophils Absolute: 0 10*3/uL (ref 0–0.1)
Basophils Relative: 0 %
Eosinophils Absolute: 0.1 10*3/uL (ref 0–0.7)
Eosinophils Relative: 1 %
HCT: 38 % — ABNORMAL LOW (ref 40.0–52.0)
Hemoglobin: 12.9 g/dL — ABNORMAL LOW (ref 13.0–18.0)
Lymphocytes Relative: 4 %
Lymphs Abs: 0.4 10*3/uL — ABNORMAL LOW (ref 1.0–3.6)
MCH: 33.7 pg (ref 26.0–34.0)
MCHC: 33.8 g/dL (ref 32.0–36.0)
MCV: 99.8 fL (ref 80.0–100.0)
Monocytes Absolute: 0.9 10*3/uL (ref 0.2–1.0)
Monocytes Relative: 7 %
Neutro Abs: 10.6 10*3/uL — ABNORMAL HIGH (ref 1.4–6.5)
Neutrophils Relative %: 88 %
Platelets: 186 10*3/uL (ref 150–440)
RBC: 3.81 MIL/uL — ABNORMAL LOW (ref 4.40–5.90)
RDW: 16.9 % — ABNORMAL HIGH (ref 11.5–14.5)
WBC: 12 10*3/uL — ABNORMAL HIGH (ref 3.8–10.6)

## 2016-02-02 LAB — COMPREHENSIVE METABOLIC PANEL
ALT: 19 U/L (ref 17–63)
AST: 13 U/L — ABNORMAL LOW (ref 15–41)
Albumin: 3.6 g/dL (ref 3.5–5.0)
Alkaline Phosphatase: 50 U/L (ref 38–126)
Anion gap: 4 — ABNORMAL LOW (ref 5–15)
BUN: 27 mg/dL — ABNORMAL HIGH (ref 6–20)
CO2: 27 mmol/L (ref 22–32)
Calcium: 9.3 mg/dL (ref 8.9–10.3)
Chloride: 103 mmol/L (ref 101–111)
Creatinine, Ser: 1.45 mg/dL — ABNORMAL HIGH (ref 0.61–1.24)
GFR calc Af Amer: 55 mL/min — ABNORMAL LOW (ref >60)
GFR calc non Af Amer: 48 mL/min — ABNORMAL LOW (ref >60)
Glucose, Bld: 171 mg/dL — ABNORMAL HIGH (ref 65–99)
Potassium: 4.3 mmol/L (ref 3.5–5.1)
Sodium: 134 mmol/L — ABNORMAL LOW (ref 135–145)
Total Bilirubin: 0.6 mg/dL (ref 0.3–1.2)
Total Protein: 6 g/dL — ABNORMAL LOW (ref 6.5–8.1)

## 2016-02-02 LAB — MAGNESIUM: Magnesium: 2 mg/dL (ref 1.7–2.4)

## 2016-02-02 MED ORDER — ALPRAZOLAM 0.25 MG PO TABS: ORAL_TABLET | ORAL | Status: DC

## 2016-02-02 NOTE — Progress Notes (Signed)
[facility] day:  02/02/2016  Chief Complaint: Logan Perez is a 70 y.o. 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  HPI: The patient was last seen in the medical oncology clinic on 01/05/2016.  At that time, he was seen prior to week #5 daratumumab.  He was doing well.  M spike was 0.1 on 01/05/2016.  Kappa free light chains were 42.6, lambda free light chains 1.51 with a ratio of 28.21  He has received daratumumab uneventfully on 01/06/2016, 01/14/2016, 01/21/2016, and 01/28/2016.    He denies any side effects.  He has taken his Singulair as scheduled.  He takes Decadron weekly.  He is on prophylactic acyclovir.  Symptomatically, he feels good.  He notes that his vision is cloudy (cataracts).  He feels well.  He denies any fevers or infections.   Past Medical History  Diagnosis Date  . Pulmonary embolism ([place])   . Stroke ([place])   . GERD (gastroesophageal reflux disease)   . Hypertension   . Atrial fibrillation ([place])   . HLD (hyperlipidemia)   . Multiple myeloma ([place])   . Elevated PSA   . BPH (benign prostatic hyperplasia)   . Difficulty voiding   . Anxiety     Past Surgical History  Procedure Laterality Date  . Back surgery  1994  . Knee arthroscopy Left 1992  . Pain pump implantation  2012  . Stem cell implant  2008    [facility]    Family History  Problem Relation Age of Onset  . Cancer Father     throat  . Bladder Cancer Neg Hx   . Kidney disease Sister   . Prostate cancer Neg Hx   . Stroke      Social History:  reports that he has quit smoking. His smoking use included Cigarettes. He has a 30 pack-year smoking history. He does not have any smokeless tobacco history on file. He reports that he does not drink alcohol or use illicit drugs.  The patient is accompanied by his wife, [name], today.  Allergies:   Allergies  Allergen Reactions  . Azithromycin Diarrhea    Possible cause of C. Diff Possible cause of C. Diff  . 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 Rivaroxaban Rash    Current Medications: Current Outpatient Prescriptions  Medication Sig Dispense Refill  . ALPRAZolam (XANAX) 0.25 MG tablet TAKE 1 TABLET BY MOUTH EVERY NIGHT AT BEDTIME AS NEEDED FOR ANXIETY. 30 tablet 0  . apixaban (ELIQUIS) 5 MG TABS tablet Take 1 tablet (5 mg total) by mouth 2 (two) times daily. 60 tablet 1  . atorvastatin (LIPITOR) 10 MG tablet Take by mouth daily at 6 PM.     . baclofen (LIORESAL) 10 MG tablet Take 1 tablet (10 mg total) by mouth at bedtime. 30 each 3  . calcium citrate-vitamin D (CITRACAL+D) 315-200 MG-UNIT per tablet Take 1 tablet by mouth daily.     . cetirizine (ZYRTEC) 10 MG tablet Take 10 mg by mouth daily.     . Cholecalciferol (VITAMIN D3) 2000 UNITS capsule Take 2,000 Units by mouth daily.     . Cyanocobalamin (RA VITAMIN B-12 TR) 1000 MCG TBCR Take 1,000 mcg by mouth daily.     [name]  dexamethasone (DECADRON) 4 MG tablet Take 16 mg (four 4 pills) on the day of treatment and 4 mg (1 pill) the day after treatment 20 tablet 2  . diltiazem (CARDIZEM CD) 120 MG 24 hr capsule TAKE 1 CAPSULE ONCE DAILY    . fluticasone (FLONASE) 50 MCG/ACT nasal spray Place into the nose.    . ibuprofen (ADVIL,MOTRIN) 200 MG tablet Take 200 mg by mouth every 6 (six) hours as needed for mild pain.     . Iron-Vitamins (GERITOL PO) Take by mouth.    . loperamide (IMODIUM) 2 MG capsule Take 2 mg by mouth as needed for diarrhea or loose stools.     . montelukast (SINGULAIR) 10 MG tablet Take once a day as directed by MD (per treatment schedule). (Patient taking differently: Take 10 mg by mouth at bedtime. Take once a day as directed by MD (per  treatment schedule).) 30 tablet 1  . omeprazole (PRILOSEC) 20 MG capsule Take 20 mg by mouth daily.     [name] PAIN MANAGEMENT IT PUMP REFILL 1 each by Intrathecal route once. Medication: PF Fentanyl 1,500.0 mcg/ml PF Bupivicaine 30.0 mg/ml PF Clonidine 300.28mg/ml Total Volume: 40 ml Needed by 12-08-15 _0  1 each 0  . potassium chloride SA (K-DUR,KLOR-CON) 20 MEQ tablet TAKE 1 TABLET(20 MEQ) BY MOUTH TWICE DAILY 180 tablet 0  . Probiotic Product ([place] CAPS Take 1 capsule by mouth daily.    . ranitidine (ZANTAC) 150 MG tablet Take 150 mg by mouth.    . tamsulosin (FLOMAX) 0.4 MG CAPS capsule Take 1 capsule (0.4 mg total) by mouth daily. 90 capsule 3  . valACYclovir (VALTREX) 500 MG tablet Take 1 tablet (500 mg total) by mouth daily. 30 tablet 3   No current facility-administered medications for this visit.    Review of Systems:  GENERAL:  Energy level is good.  No fevers or sweats .  Weight up 2 pounds. PERFORMANCE STATUS (ECOG): 1 HEENT:  Cataracts. Runny nose.  No sore throat, mouth sores or tenderness. Lungs: No shortness of breath or cough.  No hemoptysis. Cardiac:  No chest pain, palpitations, orthopnea, or PND. GI:  Diarrhea, comes and goes.  Appetite good.  No nausea, vomiting, diarrhea, constipation, melena or hematochezia. GU:  No urgency, frequency, dysuria, or hematuria. Musculoskeletal:  Compression fracture of back on a pain pump.  No joint pain.  No muscle tenderness. Extremities:  No pain or swelling. Skin:  Fragile skin.  No rashes or skin changes. Neuro:  Little tremor (chronic).  Neuropathy in feet.  No headache, weakness, balance or coordination issues. Endocrine:  No diabetes, thyroid issues, hot flashes or night sweats. Psych:  Anxiety.  Takes Xanax.  No mood changes, depression or anxiety. Pain:  Chronic back pain (pain well managed with pump). Review of systems:  All other systems reviewed and found to be negative.   Physical Exam: Blood pressure  100/67, pulse 68, temperature 97.1 F (36.2 C), weight 167 lb 1.7 oz (75.8 kg). GENERAL:  Well developed, well nourished, sitting comfortably in the exam room in no acute distress. MENTAL STATUS:  Alert and oriented to person, place and time. HEAD:  GPearline Cablesshort thin hair with goatee.  Normocephalic, atraumatic, face symmetric, no Cushingoid features. EYES:  Glasses.  Blue eyes.  Pupils equal round and reactive to light and accomodation.  No conjunctivitis or scleral icterus. ENT:  Oropharynx clear without lesion.  Hearing aide.  Tongue normal. Mucous membranes moist.  RESPIRATORY:  Clear to auscultation  without rales, wheezes or rhonchi. CARDIOVASCULAR:  Regular rate and rhythm without murmur, rub or gallop. ABDOMEN:  RUQ pain pump.  Soft, non-tender, with active bowel sounds, and no hepatosplenomegaly.  No masses. SKIN:  No rashes, ulcers or lesions. EXTREMITIES: No edema, no skin discoloration or tenderness.  No palpable cords. LYMPH NODES: No palpable cervical, supraclavicular, axillary or inguinal adenopathy  NEUROLOGICAL: Intention tremor. PSYCH:  Appropriate.   Appointment on 02/02/2016  Component Date Value Ref Range Status  . WBC 02/02/2016 12.0* 3.8 - 10.6 K/uL Final  . RBC 02/02/2016 3.81* 4.40 - 5.90 MIL/uL Final  . Hemoglobin 02/02/2016 12.9* 13.0 - 18.0 g/dL Final  . HCT 02/02/2016 38.0* 40.0 - 52.0 % Final  . MCV 02/02/2016 99.8  80.0 - 100.0 fL Final  . MCH 02/02/2016 33.7  26.0 - 34.0 pg Final  . MCHC 02/02/2016 33.8  32.0 - 36.0 g/dL Final  . RDW 02/02/2016 16.9* 11.5 - 14.5 % Final  . Platelets 02/02/2016 186  150 - 440 K/uL Final  . Neutrophils Relative % 02/02/2016 88   Final  . Neutro Abs 02/02/2016 10.6* 1.4 - 6.5 K/uL Final  . Lymphocytes Relative 02/02/2016 4   Final  . Lymphs Abs 02/02/2016 0.4* 1.0 - 3.6 K/uL Final  . Monocytes Relative 02/02/2016 7   Final  . Monocytes Absolute 02/02/2016 0.9  0.2 - 1.0 K/uL Final  . Eosinophils Relative 02/02/2016 1   Final   . Eosinophils Absolute 02/02/2016 0.1  0 - 0.7 K/uL Final  . Basophils Relative 02/02/2016 0   Final  . Basophils Absolute 02/02/2016 0.0  0 - 0.1 K/uL Final    Assessment:  Jarquavious Fentress is a 70 y.o. male with stage III mutiple myeloma.  He initially presented with progressive back pain beginning in 12/2006.  MRI revealed "spots and compression fractures".  He began Velcade, thalidomide, and Decadron.  In 08/2007, he underwent high dose chemotherapy and autologous stem cell transplant.  He recurred with a rising M-spike (2.7) with repeat M spike (1.7 gm/dl) in 03/2010.  He was initially treated with Velcade (02/08/2010 - 05/10/2010).  He then began Revlimid (15 mg 3 weeks on/1 week off) and Decadron (40 mg on day 1, 8, 15, 22).  Because of significant side effect with Decadron his dose was decreased to 10 mg once a week in 07/2010.    He was on maintenance Revlimid. Revlimid was initially10 mg 3 weeks on/1 week off. This was changed to 10 mg 2 weeks on/2 weeks off secondary to right nipple tenderness. His dose was increased to 10 mg 3 weeks on/1 week off with Decadron 10 mg a week (on Sundays) and then Revlamid 15 mg 3 weeks on and 1 week off with Decadron on Sundays.  He began Pomalyst 4 mg 3 weeks on/1 week off with Decadron on 08/27/2015.  Over the past year his SPEP has revealed no monoclonal protein (04/21/2015), 0.5 gm/dL on 09/22/2015 and 10/20/2015, and 0.1 on 01/05/2016.    Kappa free light chains were 42.6, lambda free light chains 1.51 with a ratio of 28.21   . Free light chains have been monitored. Kappa free light chains were 18.54 on 11/21/2013, 18.37 on 02/20/2014, 18.93 on 05/22/2014, 32.58 (high; normal ratio 1.27) on 08/21/2014, 51.53 (high; elevated ratio of 2.12) on 11/13/2014, 28.08 (ratio 1.73) on 12/09/2014, 23.71 (ratio 2.17) on 01/18/2015, 92.93 (ratio 9.49) on 04/21/2015, 93.44 (ratio 10.28) on 05/26/2015, 255.45 (ratio of 24.05) on 07/14/2015, 373.89 (ratio  48.31) on  08/04/2015, 474.33 (ratio 70.58) on 08/25/2015, 450.76 (ratio 55.44) on 09/22/2015, and 453.4 (ratio 59.89) on 10/20/2015.   Bone survey on 12/08/2014 was stable.  Bone survey on 10/21/2015 revealed increase conspicuity of subcentimeter lytic lesions in the calvarium.  Bone marrow aspirate and biopsy on 11/04/2015 revealed an atypical monoclonal plasma cells estimated at 30-40% of marrow cells.   Marrow was variably cellular (approximately 45%) with background trilineage hematopoiesis. There was no significant increase in marrow reticulin fibers. Storage iron was present.    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 He develoed herpes zoster in 04/2008. He developed a pulmonary embolism in 05/2013. He was initially on Xarelto, but is now on Eliquis. He had an episode of pneumonia around this time requiring a brief admission. He developed severe lower leg cramps on 08/07/2014 secondary to hypokalemia. Duplex was negative.   He was treated for C difficile colitis (Flagyl completed 07/30/2015).  He has a chronic indwelling pain pump.  He received 4 cycles of Pomalyst and Decadron (08/27/2015 - 11/19/2015).  Restaging studies document progressive disease.  Kappa free light chains are increasing.  SPEP revealed 0.5 gm/dL monoclonal protein then 1.3 gm/dL.  Bone survey reveals increase conspicuity of subcentimeter lytic lesions in the calvarium.  Bone marrow reveals 30-40% plasma cells.   MUGA on 11/30/2015 revealed an ejection fraction of 46%.  He is felt not to be a good candidate for Kyprolis.  There were no focal wall motion abnormalities.  He had a stress echo less than 1 year ago.  He has a history of PVCs and atrial fibrillation.  He takes Cardizem prn.  He has met with the bone marrow transplant team at [facility] with the plan for salvage chemotherapy and consideration of a second autologous stem cell transplant.  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  He is s/p 8 weeks of daratumumab (Darzalex) (cycle #1: 12/09/2015 - 12/30/2015: cycle #2:  01/06/2016 - 01/28/2016).  He is tolerating treatment well without side effect   Symptomatically, he feels good.  He denies any fevers or infections.  Exam is stable.   Plan:  1.  Labs today:  CBC with diff, CMP, Mg, SPEP, FLCA. 2.  Week #9 daratumumab tomorrow in [place]. 3.  Bone marrow aspirate and biopsy on 02/09/2016. 4.  RTC in 2 weeks on Wednesday in [place] for labs (CBC with diff, CMP, Mg) prior to week #10 daratumumab in [place] on 02/17/2016. 5.  RTC in 4 weeks on Wednesday in [place] for MD assessment and labs (CBC with diff, CMP, Mg, SPEP, FLCA) prior to week #11 daratumumab in [place] on 03/02/2016.   Lequita Asal, MD  02/02/2016

## 2016-02-02 NOTE — Progress Notes (Signed)
Patient here for follow up

## 2016-02-02 NOTE — Telephone Encounter (Signed)
Called to let them know that I would fax in the rx xanax and has 3 refills on it. She is glad and when we get BM bx to make sure it is not on 5/2, she wi.ould not be able to take him

## 2016-02-03 ENCOUNTER — Inpatient Hospital Stay: Payer: Medicare Other

## 2016-02-03 ENCOUNTER — Other Ambulatory Visit: Payer: Self-pay | Admitting: *Deleted

## 2016-02-03 VITALS — BP 123/85 | HR 99 | Temp 97.3°F | Resp 20

## 2016-02-03 DIAGNOSIS — C9002 Multiple myeloma in relapse: Secondary | ICD-10-CM

## 2016-02-03 DIAGNOSIS — Z5112 Encounter for antineoplastic immunotherapy: Secondary | ICD-10-CM | POA: Diagnosis not present

## 2016-02-03 LAB — KAPPA/LAMBDA LIGHT CHAINS
Kappa free light chain: 58.26 mg/L — ABNORMAL HIGH (ref 3.30–19.40)
Kappa, lambda light chain ratio: >40.74 — ABNORMAL HIGH (ref 0.26–1.65)
Lambda free light chains: <1.43 mg/L — ABNORMAL LOW (ref 5.71–26.30)

## 2016-02-03 MED ORDER — SODIUM CHLORIDE 0.9 % IV SOLN
Freq: Once | INTRAVENOUS | Status: AC
  Administered 2016-02-03: 10:00:00 via INTRAVENOUS
  Filled 2016-02-03: qty 4

## 2016-02-03 MED ORDER — DIPHENHYDRAMINE HCL 25 MG PO CAPS
50.0000 mg | ORAL_CAPSULE | Freq: Once | ORAL | Status: AC
  Administered 2016-02-03: 50 mg via ORAL
  Filled 2016-02-03: qty 2

## 2016-02-03 MED ORDER — ACETAMINOPHEN 325 MG PO TABS
650.0000 mg | ORAL_TABLET | Freq: Once | ORAL | Status: AC
  Administered 2016-02-03: 650 mg via ORAL
  Filled 2016-02-03: qty 2

## 2016-02-03 MED ORDER — SODIUM CHLORIDE 0.9 % IV SOLN
1200.0000 mg | Freq: Once | INTRAVENOUS | Status: AC
  Administered 2016-02-03: 1200 mg via INTRAVENOUS
  Filled 2016-02-03: qty 60

## 2016-02-03 MED ORDER — SODIUM CHLORIDE 0.9 % IV SOLN
Freq: Once | INTRAVENOUS | Status: AC
  Administered 2016-02-03: 09:00:00 via INTRAVENOUS
  Filled 2016-02-03: qty 1000

## 2016-02-04 LAB — PROTEIN ELECTROPHORESIS, SERUM
A/G Ratio: 1.6 (ref 0.7–1.7)
Albumin ELP: 3.2 g/dL (ref 2.9–4.4)
Alpha-1-Globulin: 0.1 g/dL (ref 0.0–0.4)
Alpha-2-Globulin: 0.7 g/dL (ref 0.4–1.0)
Beta Globulin: 0.9 g/dL (ref 0.7–1.3)
Gamma Globulin: 0.3 g/dL — ABNORMAL LOW (ref 0.4–1.8)
Globulin, Total: 2 g/dL — ABNORMAL LOW (ref 2.2–3.9)
M-Spike, %: 0.1 g/dL — ABNORMAL HIGH
Total Protein ELP: 5.2 g/dL — ABNORMAL LOW (ref 6.0–8.5)

## 2016-02-07 ENCOUNTER — Telehealth: Payer: Self-pay | Admitting: *Deleted

## 2016-02-07 NOTE — Telephone Encounter (Signed)
Pt set up for the bx wed 5/3 at 12:30 arrival for a bone marrow at 1:30.  Pt was tol dNPO after midnight and not to take eliquis this evening, not tom, and not wed am.  If he has no problems from the bone marrow he can restart evening of bone marrow.  He an dhis wife are agreeable with plan

## 2016-02-08 ENCOUNTER — Other Ambulatory Visit: Payer: Self-pay | Admitting: Radiology

## 2016-02-09 ENCOUNTER — Ambulatory Visit
Admission: RE | Admit: 2016-02-09 | Discharge: 2016-02-09 | Disposition: A | Discharge status: Home / Self Care | Payer: Medicare Other | Source: Ambulatory Visit | Attending: Hematology and Oncology | Admitting: Hematology and Oncology

## 2016-02-09 DIAGNOSIS — C9002 Multiple myeloma in relapse: Secondary | ICD-10-CM | POA: Insufficient documentation

## 2016-02-09 DIAGNOSIS — Z7901 Long term (current) use of anticoagulants: Secondary | ICD-10-CM | POA: Insufficient documentation

## 2016-02-09 DIAGNOSIS — Z79899 Other long term (current) drug therapy: Secondary | ICD-10-CM | POA: Insufficient documentation

## 2016-02-09 LAB — CBC
HEMATOCRIT: 40.4 % (ref 40.0–52.0)
Hemoglobin: 13.7 g/dL (ref 13.0–18.0)
MCH: 34 pg (ref 26.0–34.0)
MCHC: 34 g/dL (ref 32.0–36.0)
MCV: 100 fL (ref 80.0–100.0)
Platelets: 183 10*3/uL (ref 150–440)
RBC: 4.04 MIL/uL — ABNORMAL LOW (ref 4.40–5.90)
RDW: 16.7 % — AB (ref 11.5–14.5)
WBC: 10.3 10*3/uL (ref 3.8–10.6)

## 2016-02-09 LAB — PROTIME-INR
INR: 0.97
Prothrombin Time: 13.1 seconds (ref 11.4–15.0)

## 2016-02-09 LAB — APTT: APTT: 25 s (ref 24–36)

## 2016-02-09 MED ORDER — LORAZEPAM 2 MG/ML IJ SOLN
INTRAMUSCULAR | Status: AC | PRN
  Administered 2016-02-09: 1 mg via INTRAVENOUS

## 2016-02-09 MED ORDER — FENTANYL CITRATE (PF) 100 MCG/2ML IJ SOLN
INTRAMUSCULAR | Status: AC | PRN
  Administered 2016-02-09 (×2): 50 ug via INTRAVENOUS

## 2016-02-09 MED ORDER — SODIUM CHLORIDE 0.9 % IV SOLN: Freq: Once | INTRAVENOUS | Status: DC

## 2016-02-09 MED ORDER — MIDAZOLAM HCL 2 MG/2ML IJ SOLN
INTRAMUSCULAR | Status: AC | PRN
  Administered 2016-02-09 (×2): 1 mg via INTRAVENOUS

## 2016-02-09 NOTE — Procedures (Signed)
Interventional Radiology Procedure Note ? ?Procedure: CT guided aspirate and core biopsy of right iliac bone ?Complications: None ?Recommendations: ?- Bedrest supine x 1 hrs ?- OTC's PRN  Pain ?- Follow biopsy results ? ?Signed, ? ?Kj Imbert S. Yaret Hush, DO ? ? ?

## 2016-02-09 NOTE — Discharge Instructions (Signed)
Bone Marrow Aspiration and Bone Marrow Biopsy, Care After °Refer to this sheet in the next few weeks. These instructions provide you with information about caring for yourself after your procedure. Your health care provider may also give you more specific instructions. Your treatment has been planned according to current medical practices, but problems sometimes occur. Call your health care provider if you have any problems or questions after your procedure. °WHAT TO EXPECT AFTER THE PROCEDURE °After your procedure, it is common to have: °· Soreness or tenderness around the puncture site. °· Bruising. °HOME CARE INSTRUCTIONS °· Take medicines only as directed by your health care provider. °· Follow your health care provider's instructions about: °¨ Puncture site care. °¨ Bandage (dressing) changes and removal. °· Bathe and shower as directed by your health care provider. °· Check your puncture site every day for signs of infection. Watch for: °¨ Redness, swelling, or pain. °¨ Fluid, blood, or pus. °· Return to your normal activities as directed by your health care provider. °· Keep all follow-up visits as directed by your health care provider. This is important. °SEEK MEDICAL CARE IF: °· You have a fever. °· You have uncontrollable bleeding. °· You have redness, swelling, or pain at the site of your puncture. °· You have fluid, blood, or pus coming from your puncture site. °  °This information is not intended to replace advice given to you by your health care provider. Make sure you discuss any questions you have with your health care provider. °  °Document Released: 04/14/2005 Document Revised: 02/09/2015 Document Reviewed: 09/16/2014 °Elsevier Interactive Patient Education ©2016 [employer] ° °

## 2016-02-14 ENCOUNTER — Telehealth: Payer: Self-pay

## 2016-02-14 ENCOUNTER — Other Ambulatory Visit: Payer: Self-pay | Admitting: Hematology and Oncology

## 2016-02-14 NOTE — Telephone Encounter (Signed)
Faxed Bone Marrow Results to Dr. Adriana Simas at [facility].  Fax confirmation received

## 2016-02-16 ENCOUNTER — Other Ambulatory Visit: Payer: Self-pay | Admitting: *Deleted

## 2016-02-16 ENCOUNTER — Telehealth: Payer: Self-pay | Admitting: *Deleted

## 2016-02-16 ENCOUNTER — Inpatient Hospital Stay: Payer: Medicare Other | Attending: Hematology and Oncology

## 2016-02-16 DIAGNOSIS — E785 Hyperlipidemia, unspecified: Secondary | ICD-10-CM | POA: Insufficient documentation

## 2016-02-16 DIAGNOSIS — Z87891 Personal history of nicotine dependence: Secondary | ICD-10-CM | POA: Insufficient documentation

## 2016-02-16 DIAGNOSIS — C9002 Multiple myeloma in relapse: Secondary | ICD-10-CM | POA: Diagnosis not present

## 2016-02-16 DIAGNOSIS — Z8673 Personal history of transient ischemic attack (TIA), and cerebral infarction without residual deficits: Secondary | ICD-10-CM | POA: Insufficient documentation

## 2016-02-16 DIAGNOSIS — K219 Gastro-esophageal reflux disease without esophagitis: Secondary | ICD-10-CM | POA: Diagnosis not present

## 2016-02-16 DIAGNOSIS — M5489 Other dorsalgia: Secondary | ICD-10-CM | POA: Diagnosis not present

## 2016-02-16 DIAGNOSIS — N400 Enlarged prostate without lower urinary tract symptoms: Secondary | ICD-10-CM | POA: Insufficient documentation

## 2016-02-16 DIAGNOSIS — Z5112 Encounter for antineoplastic immunotherapy: Secondary | ICD-10-CM | POA: Insufficient documentation

## 2016-02-16 DIAGNOSIS — G629 Polyneuropathy, unspecified: Secondary | ICD-10-CM | POA: Diagnosis not present

## 2016-02-16 DIAGNOSIS — Z87311 Personal history of (healed) other pathological fracture: Secondary | ICD-10-CM | POA: Insufficient documentation

## 2016-02-16 DIAGNOSIS — Z9221 Personal history of antineoplastic chemotherapy: Secondary | ICD-10-CM | POA: Insufficient documentation

## 2016-02-16 DIAGNOSIS — G8929 Other chronic pain: Secondary | ICD-10-CM | POA: Insufficient documentation

## 2016-02-16 DIAGNOSIS — Z8051 Family history of malignant neoplasm of kidney: Secondary | ICD-10-CM | POA: Diagnosis not present

## 2016-02-16 DIAGNOSIS — R197 Diarrhea, unspecified: Secondary | ICD-10-CM | POA: Insufficient documentation

## 2016-02-16 DIAGNOSIS — I10 Essential (primary) hypertension: Secondary | ICD-10-CM | POA: Diagnosis not present

## 2016-02-16 DIAGNOSIS — Z800 Family history of malignant neoplasm of digestive organs: Secondary | ICD-10-CM | POA: Diagnosis not present

## 2016-02-16 DIAGNOSIS — Z86711 Personal history of pulmonary embolism: Secondary | ICD-10-CM | POA: Insufficient documentation

## 2016-02-16 DIAGNOSIS — I4891 Unspecified atrial fibrillation: Secondary | ICD-10-CM | POA: Insufficient documentation

## 2016-02-16 DIAGNOSIS — Z79899 Other long term (current) drug therapy: Secondary | ICD-10-CM | POA: Diagnosis not present

## 2016-02-16 DIAGNOSIS — Z9484 Stem cells transplant status: Secondary | ICD-10-CM | POA: Diagnosis not present

## 2016-02-16 LAB — CBC WITH DIFFERENTIAL/PLATELET
Basophils Absolute: 0 10*3/uL (ref 0–0.1)
Basophils Relative: 1 %
Eosinophils Absolute: 0.1 10*3/uL (ref 0–0.7)
Eosinophils Relative: 1 %
HCT: 35.9 % — ABNORMAL LOW (ref 40.0–52.0)
Hemoglobin: 12.2 g/dL — ABNORMAL LOW (ref 13.0–18.0)
Lymphocytes Relative: 6 %
Lymphs Abs: 0.4 10*3/uL — ABNORMAL LOW (ref 1.0–3.6)
MCH: 34 pg (ref 26.0–34.0)
MCHC: 33.9 g/dL (ref 32.0–36.0)
MCV: 100.2 fL — ABNORMAL HIGH (ref 80.0–100.0)
Monocytes Absolute: 0.7 10*3/uL (ref 0.2–1.0)
Monocytes Relative: 9 %
Neutro Abs: 5.8 10*3/uL (ref 1.4–6.5)
Neutrophils Relative %: 83 %
Platelets: 171 10*3/uL (ref 150–440)
RBC: 3.58 MIL/uL — ABNORMAL LOW (ref 4.40–5.90)
RDW: 16.7 % — ABNORMAL HIGH (ref 11.5–14.5)
WBC: 7 10*3/uL (ref 3.8–10.6)

## 2016-02-16 LAB — COMPREHENSIVE METABOLIC PANEL
ALT: 18 U/L (ref 17–63)
AST: 19 U/L (ref 15–41)
Albumin: 3.6 g/dL (ref 3.5–5.0)
Alkaline Phosphatase: 53 U/L (ref 38–126)
Anion gap: 8 (ref 5–15)
BUN: 13 mg/dL (ref 6–20)
CO2: 25 mmol/L (ref 22–32)
Calcium: 9.5 mg/dL (ref 8.9–10.3)
Chloride: 104 mmol/L (ref 101–111)
Creatinine, Ser: 1.38 mg/dL — ABNORMAL HIGH (ref 0.61–1.24)
GFR calc Af Amer: 59 mL/min — ABNORMAL LOW (ref >60)
GFR calc non Af Amer: 51 mL/min — ABNORMAL LOW (ref >60)
Glucose, Bld: 133 mg/dL — ABNORMAL HIGH (ref 65–99)
Potassium: 4.3 mmol/L (ref 3.5–5.1)
Sodium: 137 mmol/L (ref 135–145)
Total Bilirubin: 0.7 mg/dL (ref 0.3–1.2)
Total Protein: 6 g/dL — ABNORMAL LOW (ref 6.5–8.1)

## 2016-02-16 LAB — MAGNESIUM: Magnesium: 1.6 mg/dL — ABNORMAL LOW (ref 1.7–2.4)

## 2016-02-16 NOTE — Telephone Encounter (Signed)
Called house and spoke to wife to let her know that [place]. Low and he will need mag tom. With chemo.  She wanted to know about bone marrow and I told her it was great and I know Dr. Mike Gip was going to contact Dr. Evelene Croon and let her know.

## 2016-02-17 ENCOUNTER — Other Ambulatory Visit: Payer: Self-pay | Admitting: Hematology and Oncology

## 2016-02-17 ENCOUNTER — Inpatient Hospital Stay: Payer: Medicare Other

## 2016-02-17 DIAGNOSIS — Z5112 Encounter for antineoplastic immunotherapy: Secondary | ICD-10-CM | POA: Diagnosis not present

## 2016-02-17 DIAGNOSIS — C9002 Multiple myeloma in relapse: Secondary | ICD-10-CM

## 2016-02-17 MED ORDER — ACETAMINOPHEN 325 MG PO TABS
650.0000 mg | ORAL_TABLET | Freq: Once | ORAL | Status: AC
  Administered 2016-02-17: 650 mg via ORAL
  Filled 2016-02-17: qty 2

## 2016-02-17 MED ORDER — SODIUM CHLORIDE 0.9 % IV SOLN
1200.0000 mg | Freq: Once | INTRAVENOUS | Status: AC
  Administered 2016-02-17: 1200 mg via INTRAVENOUS
  Filled 2016-02-17: qty 60

## 2016-02-17 MED ORDER — MAGNESIUM SULFATE 2 GM/50ML IV SOLN
2.0000 g | Freq: Once | INTRAVENOUS | Status: AC
  Administered 2016-02-17: 2 g via INTRAVENOUS
  Filled 2016-02-17: qty 50

## 2016-02-17 MED ORDER — SODIUM CHLORIDE 0.9 % IV SOLN
Freq: Once | INTRAVENOUS | Status: AC
  Administered 2016-02-17: 10:00:00 via INTRAVENOUS
  Filled 2016-02-17: qty 4

## 2016-02-17 MED ORDER — DIPHENHYDRAMINE HCL 25 MG PO CAPS
50.0000 mg | ORAL_CAPSULE | Freq: Once | ORAL | Status: AC
  Administered 2016-02-17: 50 mg via ORAL
  Filled 2016-02-17: qty 2

## 2016-02-17 MED ORDER — SODIUM CHLORIDE 0.9 % IV SOLN
Freq: Once | INTRAVENOUS | Status: AC
  Administered 2016-02-17: 09:00:00 via INTRAVENOUS
  Filled 2016-02-17: qty 1000

## 2016-02-19 ENCOUNTER — Other Ambulatory Visit: Payer: Self-pay | Admitting: Pain Medicine

## 2016-02-23 ENCOUNTER — Other Ambulatory Visit: Payer: Self-pay | Admitting: Hematology and Oncology

## 2016-02-24 ENCOUNTER — Other Ambulatory Visit: Payer: Self-pay | Admitting: Hematology and Oncology

## 2016-03-01 ENCOUNTER — Inpatient Hospital Stay: Payer: Medicare Other

## 2016-03-01 ENCOUNTER — Inpatient Hospital Stay (HOSPITAL_BASED_OUTPATIENT_CLINIC_OR_DEPARTMENT_OTHER): Payer: Medicare Other | Admitting: Hematology and Oncology

## 2016-03-01 VITALS — BP 126/87 | HR 97 | Temp 96.9°F | Resp 18 | Ht 67.0 in | Wt 171.8 lb

## 2016-03-01 DIAGNOSIS — Z9221 Personal history of antineoplastic chemotherapy: Secondary | ICD-10-CM | POA: Diagnosis not present

## 2016-03-01 DIAGNOSIS — R197 Diarrhea, unspecified: Secondary | ICD-10-CM

## 2016-03-01 DIAGNOSIS — Z8673 Personal history of transient ischemic attack (TIA), and cerebral infarction without residual deficits: Secondary | ICD-10-CM

## 2016-03-01 DIAGNOSIS — Z9484 Stem cells transplant status: Secondary | ICD-10-CM | POA: Diagnosis not present

## 2016-03-01 DIAGNOSIS — I4891 Unspecified atrial fibrillation: Secondary | ICD-10-CM

## 2016-03-01 DIAGNOSIS — M5489 Other dorsalgia: Secondary | ICD-10-CM

## 2016-03-01 DIAGNOSIS — I10 Essential (primary) hypertension: Secondary | ICD-10-CM

## 2016-03-01 DIAGNOSIS — E785 Hyperlipidemia, unspecified: Secondary | ICD-10-CM

## 2016-03-01 DIAGNOSIS — C9002 Multiple myeloma in relapse: Secondary | ICD-10-CM

## 2016-03-01 DIAGNOSIS — G629 Polyneuropathy, unspecified: Secondary | ICD-10-CM

## 2016-03-01 DIAGNOSIS — Z5112 Encounter for antineoplastic immunotherapy: Secondary | ICD-10-CM | POA: Diagnosis not present

## 2016-03-01 DIAGNOSIS — Z79899 Other long term (current) drug therapy: Secondary | ICD-10-CM

## 2016-03-01 DIAGNOSIS — Z87311 Personal history of (healed) other pathological fracture: Secondary | ICD-10-CM

## 2016-03-01 DIAGNOSIS — G8929 Other chronic pain: Secondary | ICD-10-CM

## 2016-03-01 LAB — CBC WITH DIFFERENTIAL/PLATELET
Basophils Absolute: 0 10*3/uL (ref 0–0.1)
Basophils Relative: 1 %
Eosinophils Absolute: 0.1 10*3/uL (ref 0–0.7)
Eosinophils Relative: 1 %
HCT: 37.7 % — ABNORMAL LOW (ref 40.0–52.0)
Hemoglobin: 12.8 g/dL — ABNORMAL LOW (ref 13.0–18.0)
Lymphocytes Relative: 10 %
Lymphs Abs: 0.8 10*3/uL — ABNORMAL LOW (ref 1.0–3.6)
MCH: 34.1 pg — ABNORMAL HIGH (ref 26.0–34.0)
MCHC: 33.9 g/dL (ref 32.0–36.0)
MCV: 100.6 fL — ABNORMAL HIGH (ref 80.0–100.0)
Monocytes Absolute: 1.1 10*3/uL — ABNORMAL HIGH (ref 0.2–1.0)
Monocytes Relative: 15 %
Neutro Abs: 5.8 10*3/uL (ref 1.4–6.5)
Neutrophils Relative %: 73 %
Platelets: 201 10*3/uL (ref 150–440)
RBC: 3.75 MIL/uL — ABNORMAL LOW (ref 4.40–5.90)
RDW: 16.7 % — ABNORMAL HIGH (ref 11.5–14.5)
WBC: 7.8 10*3/uL (ref 3.8–10.6)

## 2016-03-01 LAB — COMPREHENSIVE METABOLIC PANEL
ALT: 17 U/L (ref 17–63)
AST: 17 U/L (ref 15–41)
Albumin: 3.8 g/dL (ref 3.5–5.0)
Alkaline Phosphatase: 50 U/L (ref 38–126)
Anion gap: 6 (ref 5–15)
BUN: 14 mg/dL (ref 6–20)
CO2: 25 mmol/L (ref 22–32)
Calcium: 9.5 mg/dL (ref 8.9–10.3)
Chloride: 106 mmol/L (ref 101–111)
Creatinine, Ser: 1.29 mg/dL — ABNORMAL HIGH (ref 0.61–1.24)
GFR calc Af Amer: >60 mL/min (ref >60)
GFR calc non Af Amer: 55 mL/min — ABNORMAL LOW (ref >60)
Glucose, Bld: 146 mg/dL — ABNORMAL HIGH (ref 65–99)
Potassium: 3.9 mmol/L (ref 3.5–5.1)
Sodium: 137 mmol/L (ref 135–145)
Total Bilirubin: 0.7 mg/dL (ref 0.3–1.2)
Total Protein: 6.3 g/dL — ABNORMAL LOW (ref 6.5–8.1)

## 2016-03-01 LAB — MAGNESIUM: Magnesium: 1.8 mg/dL (ref 1.7–2.4)

## 2016-03-01 NOTE — Progress Notes (Signed)
Pt concerned about where he hit himself against desk and right lower side up against pain pump that has bruised and sore.  Pt reports he would like to have cataract surgery in  July and needs permission.  Pt reports bruising on arms from dog nail and thinks he bruises easier since being on eliquis.  Pt complains of pain in back where his ribs are when pushing close to kidney area.

## 2016-03-01 NOTE — Progress Notes (Signed)
[facility] day:  03/01/2016  Chief Complaint: Logan Perez is a 70 y.o. 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  HPI: The patient was last seen in the medical oncology clinic on 02/02/2016.  At that time, he was seen prior to week #9 daratumumab.  Symptomatically, he was doing well.  M spike was 0.1 on 02/02/2016.  Kappa free light chains were 58.26, lambda free light chains  < 1.43 with a ratio of > 40.74.  Bone marrow on 02/09/2016 revealed no diagnostic morphologic evidence of plasma cell myeloma identified.  Marrow was normocellular to hypocellular marrow for age (ranging from 10-40%) with maturing trilineage hematopoiesis and mild multilineage dyspoiesis.  There was patchy mild increase in reticulin.  Storage iron was present.  Flow cytometry revealed no definitive evidence of monoclonality detected.  There was a non-specific atypical myeloid and monocytic findings with no increase in blasts.  Cytogenetics were normal (46, XY).  Per protocol, after 2 months of weekly daratumumab, he switched to every other week.  He received daratumumab uneventfully on 02/03/2016 and 02/17/2016.    He met with Adriana Simas at [facility].  He was felt to have a very good partial response.  Discussions were held about continuation of daratumumab and an autologous stem cell transplant in 06/2016.   During the interim, he notes a lot of "little aches and pains".  He comments that the site of his bone marrow performed 3 weeks ago hurts.  He notes that the same site has hurt since "day 1 in 2008".  He notes a tender spot on an anterior right lower rib and right posterior rib.  He denies any trauma except for bumping into a counter at [facility] on the day of his bone marrow marrow and "getting caught on the molding".  He has had a bruise near his pain pump on that  site that is slowly fading.      Past Medical History  Diagnosis Date  . Pulmonary embolism ([place])   . Stroke ([place])   . GERD (gastroesophageal reflux disease)   . Hypertension   . Atrial fibrillation ([place])   . HLD (hyperlipidemia)   . Multiple myeloma ([place])   . Elevated PSA   . BPH (benign prostatic hyperplasia)   . Difficulty voiding   . Anxiety     Past Surgical History  Procedure Laterality Date  . Back surgery  1994  . Knee arthroscopy Left 1992  . Pain pump implantation  2012  . Stem cell implant  2008    [facility]    Family History  Problem Relation Age of Onset  . Cancer Father     throat  . Bladder Cancer Neg Hx   . Kidney disease Sister   . Prostate cancer Neg Hx   . Stroke      Social History:  reports that he has quit smoking. His smoking use included Cigarettes. He has a 30 pack-year smoking history. He does not have any smokeless tobacco history on file. He reports that he does not drink alcohol or use illicit drugs.  His wife's name is [name].  The patient is alone today.  Allergies:  Allergies  Allergen Reactions  . Azithromycin Diarrhea    Possible cause of C. Diff Possible cause of C. Diff  . 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 Rivaroxaban Rash    Current Medications: Current Outpatient Prescriptions  Medication Sig Dispense Refill  . ALPRAZolam (XANAX) 0.25 MG tablet TAKE 1 TABLET BY MOUTH EVERY NIGHT AT BEDTIME AS NEEDED FOR ANXIETY. 30 tablet 3  . apixaban (ELIQUIS) 5 MG TABS tablet Take 1 tablet (5 mg total) by mouth 2 (two) times daily. 60 tablet 1  . atorvastatin (LIPITOR) 10 MG tablet Take by mouth daily at 6 PM.     . baclofen (LIORESAL) 10 MG tablet Take 1 tablet (10 mg total) by mouth at bedtime. 30 each 3  . calcium citrate-vitamin D (CITRACAL+D) 315-200 MG-UNIT  per tablet Take 1 tablet by mouth daily.     . cetirizine (ZYRTEC) 10 MG tablet Take 10 mg by mouth daily.     . Cholecalciferol (VITAMIN D3) 2000 UNITS capsule Take 2,000 Units by mouth daily.     . Cyanocobalamin (RA VITAMIN B-12 TR) 1000 MCG TBCR Take 1,000 mcg by mouth daily.     [name] diltiazem (CARDIZEM CD) 120 MG 24 hr capsule TAKE 1 CAPSULE ONCE DAILY    . fluticasone (FLONASE) 50 MCG/ACT nasal spray Place into the nose.    . Iron-Vitamins (GERITOL PO) Take by mouth.    . loperamide (IMODIUM) 2 MG capsule Take 2 mg by mouth as needed for diarrhea or loose stools. Reported on 03/01/2016    . montelukast (SINGULAIR) 10 MG tablet Take once a day as directed by MD (per treatment schedule). (Patient taking differently: Take 10 mg by mouth at bedtime. Take once a day as directed by MD (per treatment schedule).) 30 tablet 1  . omeprazole (PRILOSEC) 20 MG capsule Take 20 mg by mouth daily.     [name] PAIN MANAGEMENT IT PUMP REFILL 1 each by Intrathecal route once. Medication: PF Fentanyl 1,500.0 mcg/ml PF Bupivicaine 30.0 mg/ml PF Clonidine 300.1mg/ml Total Volume: 40 ml Needed by 12-08-15 '@1400'  1 each 0  . potassium chloride SA (K-DUR,KLOR-CON) 20 MEQ tablet TAKE 1 TABLET(20 MEQ) BY MOUTH TWICE DAILY 180 tablet 0  . Probiotic Product (PCamilla CAPS Take 1 capsule by mouth daily.    . ranitidine (ZANTAC) 150 MG tablet Take 150 mg by mouth.    . tamsulosin (FLOMAX) 0.4 MG CAPS capsule Take 1 capsule (0.4 mg total) by mouth daily. 90 capsule 3  . valACYclovir (VALTREX) 500 MG tablet TAKE 1 TABLET BY MOUTH DAILY 30 tablet 0  . dexamethasone (DECADRON) 4 MG tablet TAKE 4 TABLETS BY MOUTH ON THE DAY OF TREATMENT AND 1 TABLET BY MOUTH ON THE DAY AFTER TREATMENT (Patient not taking: Reported on 03/01/2016) 20 tablet 0   No current facility-administered medications for this visit.    Review of Systems:  GENERAL:  Energy level is good.  No fevers or sweats .  Weight up 4 pounds. PERFORMANCE  STATUS (ECOG): 1 HEENT:  Cataracts, feels like "looking through tears".  No sore throat, mouth sores or tenderness. Lungs: No shortness of breath or cough.  No hemoptysis. Cardiac:  No chest pain, palpitations, orthopnea, or PND. GI:  Diarrhea, comes and goes.  Appetite good.  No nausea, vomiting, diarrhea, constipation, melena or hematochezia. GU:  No urgency, frequency, dysuria, or hematuria. Musculoskeletal:  Compression fracture of back on a pain pump.  Sore at bone marrow site (old) and ribs (see HPI). No muscle tenderness. Extremities:  No pain  or swelling. Skin:  Fragile skin.  Bruise near pain pump (see HPI).  No rashes or skin changes. Neuro:  Little tremor (chronic).  Neuropathy in feet.  No headache, weakness, balance or coordination issues. Endocrine:  No diabetes, thyroid issues, hot flashes or night sweats. Psych:  No mood changes, depression or anxiety. Pain:  Chronic back pain (pain well managed with pump). Review of systems:  All other systems reviewed and found to be negative.   Physical Exam: Blood pressure 126/87, pulse 97, temperature 96.9 F (36.1 C), temperature source Tympanic, resp. rate 18, height '5\' 7"'  (1.702 m), weight 171 lb 13.6 oz (77.95 kg). GENERAL:  Well developed, well nourished, sitting comfortably in the exam room in no acute distress. MENTAL STATUS:  Alert and oriented to person, place and time. HEAD:  Short gray hair.  Normocephalic, atraumatic, face symmetric, no Cushingoid features. EYES:  Glasses.  Blue eyes.  Pupils equal round and reactive to light and accomodation.  No conjunctivitis or scleral icterus. ENT:  Hearing aide.  Oropharynx clear without lesion.  Hearing aide.  Tongue normal. Mucous membranes moist.  RESPIRATORY:  Clear to auscultation without rales, wheezes or rhonchi. CARDIOVASCULAR:  Regular rate and rhythm without murmur, rub or gallop. CHEST WALL:  Tender focal spot right lower 11th or 12th rib and right posterior rib.  No  overlying erythema. ABDOMEN:  RUQ pain pump.  Soft, non-tender, with active bowel sounds, and no hepatosplenomegaly.  No masses. SKIN:  Resolving area of ecchymosis lateral to pain pump.  Bone marrow site unremarkable.  No rashes, ulcers or lesions. EXTREMITIES: No edema, no skin discoloration or tenderness.  No palpable cords. LYMPH NODES: No palpable cervical, supraclavicular, axillary or inguinal adenopathy  NEUROLOGICAL: Intention tremor. PSYCH:  Appropriate.   Appointment on 03/01/2016  Component Date Value Ref Range Status  . WBC 03/01/2016 7.8  3.8 - 10.6 K/uL Final  . RBC 03/01/2016 3.75* 4.40 - 5.90 MIL/uL Final  . Hemoglobin 03/01/2016 12.8* 13.0 - 18.0 g/dL Final  . HCT 03/01/2016 37.7* 40.0 - 52.0 % Final  . MCV 03/01/2016 100.6* 80.0 - 100.0 fL Final  . MCH 03/01/2016 34.1* 26.0 - 34.0 pg Final  . MCHC 03/01/2016 33.9  32.0 - 36.0 g/dL Final  . RDW 03/01/2016 16.7* 11.5 - 14.5 % Final  . Platelets 03/01/2016 201  150 - 440 K/uL Final  . Neutrophils Relative % 03/01/2016 73%   Final  . Neutro Abs 03/01/2016 5.8  1.4 - 6.5 K/uL Final  . Lymphocytes Relative 03/01/2016 10%   Final  . Lymphs Abs 03/01/2016 0.8* 1.0 - 3.6 K/uL Final  . Monocytes Relative 03/01/2016 15%   Final  . Monocytes Absolute 03/01/2016 1.1* 0.2 - 1.0 K/uL Final  . Eosinophils Relative 03/01/2016 1%   Final  . Eosinophils Absolute 03/01/2016 0.1  0 - 0.7 K/uL Final  . Basophils Relative 03/01/2016 1%   Final  . Basophils Absolute 03/01/2016 0.0  0 - 0.1 K/uL Final  . Sodium 03/01/2016 137  135 - 145 mmol/L Final  . Potassium 03/01/2016 3.9  3.5 - 5.1 mmol/L Final  . Chloride 03/01/2016 106  101 - 111 mmol/L Final  . CO2 03/01/2016 25  22 - 32 mmol/L Final  . Glucose, Bld 03/01/2016 146* 65 - 99 mg/dL Final  . BUN 03/01/2016 14  6 - 20 mg/dL Final  . Creatinine, Ser 03/01/2016 1.29* 0.61 - 1.24 mg/dL Final  . Calcium 03/01/2016 9.5  8.9 - 10.3 mg/dL Final  .  Total Protein 03/01/2016 6.3* 6.5 - 8.1  g/dL Final  . Albumin 03/01/2016 3.8  3.5 - 5.0 g/dL Final  . AST 03/01/2016 17  15 - 41 U/L Final  . ALT 03/01/2016 17  17 - 63 U/L Final  . Alkaline Phosphatase 03/01/2016 50  38 - 126 U/L Final  . Total Bilirubin 03/01/2016 0.7  0.3 - 1.2 mg/dL Final  . GFR calc non Af Amer 03/01/2016 55* >60 mL/min Final  . GFR calc Af Amer 03/01/2016 >60  >60 mL/min Final   Comment: (NOTE) The eGFR has been calculated using the CKD EPI equation. This calculation has not been validated in all clinical situations. eGFR's persistently <60 mL/min signify possible Chronic Kidney Disease.   . Anion gap 03/01/2016 6  5 - 15 Final  . Magnesium 03/01/2016 1.8  1.7 - 2.4 mg/dL Final    Assessment:  Logan Perez is a 70 y.o. male with stage III mutiple myeloma.  He initially presented with progressive back pain beginning in 12/2006.  MRI revealed "spots and compression fractures".  He began Velcade, thalidomide, and Decadron.  In 08/2007, he underwent high dose chemotherapy and autologous stem cell transplant.  He recurred with a rising M-spike (2.7) with repeat M spike (1.7 gm/dl) in 03/2010.  He was initially treated with Velcade (02/08/2010 - 05/10/2010).  He then began Revlimid (15 mg 3 weeks on/1 week off) and Decadron (40 mg on day 1, 8, 15, 22).  Because of significant side effect with Decadron his dose was decreased to 10 mg once a week in 07/2010.    He was on maintenance Revlimid. Revlimid was initially10 mg 3 weeks on/1 week off. This was changed to 10 mg 2 weeks on/2 weeks off secondary to right nipple tenderness. His dose was increased to 10 mg 3 weeks on/1 week off with Decadron 10 mg a week (on Sundays) and then Revlamid 15 mg 3 weeks on and 1 week off with Decadron on Sundays.  He began Pomalyst 4 mg 3 weeks on/1 week off with Decadron on 08/27/2015.  Over the past year his SPEP has revealed no monoclonal protein (04/21/2015) and 0.5 gm/dL on 09/22/2015 and 10/20/2015. M spike was 0.1  on 02/02/2016 and 03/01/2016.  Free light chains have been monitored. Kappa free light chains were 18.54 on 11/21/2013, 18.37 on 02/20/2014, 18.93 on 05/22/2014, 32.58 (high; normal ratio 1.27) on 08/21/2014, 51.53 (high; elevated ratio of 2.12) on 11/13/2014, 28.08 (ratio 1.73) on 12/09/2014, 23.71 (ratio 2.17) on 01/18/2015, 92.93 (ratio 9.49) on 04/21/2015, 93.44 (ratio 10.28) on 05/26/2015, 255.45 (ratio of 24.05) on 07/14/2015, 373.89 (ratio 48.31) on 08/04/2015, 474.33 (ratio 70.58) on 08/25/2015, 450.76 (ratio 55.44) on 09/22/2015, 453.4 (ratio 59.89) on 10/20/2015, and 58.26 (ratio of > 40.74) on 02/02/2016.  Bone survey on 12/08/2014 was stable.  Bone survey on 10/21/2015 revealed increase conspicuity of subcentimeter lytic lesions in the calvarium.  Bone marrow aspirate and biopsy on 11/04/2015 revealed an atypical monoclonal plasma cells estimated at 30-40% of marrow cells.   Marrow was variably cellular (approximately 45%) with background trilineage hematopoiesis. There was no significant increase in marrow reticulin fibers. Storage iron was present.    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 He develoed herpes zoster in 04/2008. He developed a pulmonary embolism in 05/2013. He was initially on Xarelto, but is now on Eliquis. He had an episode of pneumonia around this time requiring a brief admission. He developed severe lower leg  cramps on 08/07/2014 secondary to hypokalemia. Duplex was negative.   He was treated for C difficile colitis (Flagyl completed 07/30/2015).  He has a chronic indwelling pain pump.  He received 4 cycles of Pomalyst and Decadron (08/27/2015 - 11/19/2015).  Restaging studies document progressive disease.  Kappa free light chains are increasing.  SPEP revealed 0.5 gm/dL monoclonal protein then 1.3 gm/dL.  Bone survey reveals increase conspicuity of subcentimeter lytic lesions in the calvarium.  Bone marrow  reveals 30-40% plasma cells.   MUGA on 11/30/2015 revealed an ejection fraction of 46%.  He is felt not to be a good candidate for Kyprolis.  There were no focal wall motion abnormalities.  He had a stress echo less than 1 year ago.  He has a history of PVCs and atrial fibrillation.  He takes Cardizem prn.  He has met with the bone marrow transplant team at [facility] with the plan for salvage chemotherapy and consideration of a second autologous stem cell transplant.  He has frozen stem cells from his first transplant.  He is s/p 10 weeks of daratumumab (Darzalex) (began 12/09/2015).  He is tolerating treatment well without side effect.  Bone marrow on 02/09/2016 revealed no diagnostic morphologic evidence of plasma cell myeloma.  Marrow was normocellular to hypocellular marrow for age (ranging from 10-40%) with maturing trilineage hematopoiesis and mild multilineage dyspoiesis.  There was patchy mild increase in reticulin.  Storage iron was present.  Flow cytometry revealed no definitive evidence of monoclonality.  There was a non-specific atypical myeloid and monocytic findings with no increase in blasts.  Cytogenetics were normal (46, XY).  Symptomatically, he has some focal discomfort in a couple of right sided ribs.  He has a large area of resolving ecchymosis s/p trauma near his pain pump.  M spike is 0.1 gm/dL.  Plan:  1.  Labs today:  CBC with diff, CMP, Mg, SPEP, FLCA. 2.  Week #11 daratumumab tomorrow in [employer]. 3.  Phone follow-up with Dr. Evelene Croon at [facility] regarding timing of transplant. 4.  Discuss patient's plans for cataract surgery.  Discuss postponing until after transplant.  Patient does not think he can wait that long. 5.  RTC on 03/15/2016 (Wednesday) in [place] for labs (CBC with diff, CMP, Mg). 6.  RTC on 03/16/2016 (Thursday) in [place] for week #12 daratumumab. 7.  RTC on 03/29/2016 (Wednesday) in [place] for MD assessment and labs (CBC with diff, CMP, Mg, SPEP, FLCA). 8.  RTC  on 03/30/2016 (Thursday) in [place] for week #13 daratumumab.   Lequita Asal, MD  03/01/2016, 3:18 PM

## 2016-03-02 ENCOUNTER — Encounter: Payer: Self-pay | Admitting: Hematology and Oncology

## 2016-03-02 ENCOUNTER — Other Ambulatory Visit: Payer: Self-pay | Admitting: Hematology and Oncology

## 2016-03-02 ENCOUNTER — Inpatient Hospital Stay: Payer: Medicare Other

## 2016-03-02 ENCOUNTER — Ambulatory Visit: Payer: Medicare Other | Attending: Family Medicine

## 2016-03-02 VITALS — BP 116/79 | HR 94 | Temp 97.7°F | Resp 20

## 2016-03-02 DIAGNOSIS — Z5112 Encounter for antineoplastic immunotherapy: Secondary | ICD-10-CM | POA: Diagnosis not present

## 2016-03-02 DIAGNOSIS — C9002 Multiple myeloma in relapse: Secondary | ICD-10-CM

## 2016-03-02 LAB — PROTEIN ELECTROPHORESIS, SERUM
A/G Ratio: 1.5 (ref 0.7–1.7)
Albumin ELP: 3.4 g/dL (ref 2.9–4.4)
Alpha-1-Globulin: 0.2 g/dL (ref 0.0–0.4)
Alpha-2-Globulin: 0.8 g/dL (ref 0.4–1.0)
Beta Globulin: 1.1 g/dL (ref 0.7–1.3)
Gamma Globulin: 0.2 g/dL — ABNORMAL LOW (ref 0.4–1.8)
Globulin, Total: 2.2 g/dL (ref 2.2–3.9)
M-Spike, %: 0.1 g/dL — ABNORMAL HIGH
Total Protein ELP: 5.6 g/dL — ABNORMAL LOW (ref 6.0–8.5)

## 2016-03-02 LAB — KAPPA/LAMBDA LIGHT CHAINS
Kappa free light chain: 62.67 mg/L — ABNORMAL HIGH (ref 3.30–19.40)
Kappa, lambda light chain ratio: 37.98 — ABNORMAL HIGH (ref 0.26–1.65)
Lambda free light chains: 1.65 mg/L — ABNORMAL LOW (ref 5.71–26.30)

## 2016-03-02 MED ORDER — ACETAMINOPHEN 325 MG PO TABS
650.0000 mg | ORAL_TABLET | Freq: Once | ORAL | Status: AC
Start: 2016-03-02 — End: 2016-03-02
  Administered 2016-03-02: 650 mg via ORAL
  Filled 2016-03-02: qty 2

## 2016-03-02 MED ORDER — SODIUM CHLORIDE 0.9 % IV SOLN
1200.0000 mg | Freq: Once | INTRAVENOUS | Status: AC
  Administered 2016-03-02: 1200 mg via INTRAVENOUS
  Filled 2016-03-02: qty 60

## 2016-03-02 MED ORDER — SODIUM CHLORIDE 0.9 % IV SOLN
Freq: Once | INTRAVENOUS | Status: AC
  Administered 2016-03-02: 09:00:00 via INTRAVENOUS
  Filled 2016-03-02: qty 1000

## 2016-03-02 MED ORDER — DIPHENHYDRAMINE HCL 25 MG PO CAPS
50.0000 mg | ORAL_CAPSULE | Freq: Once | ORAL | Status: AC
  Administered 2016-03-02: 50 mg via ORAL
  Filled 2016-03-02: qty 2

## 2016-03-02 MED ORDER — SODIUM CHLORIDE 0.9 % IV SOLN
Freq: Once | INTRAVENOUS | Status: AC
  Administered 2016-03-02: 10:00:00 via INTRAVENOUS
  Filled 2016-03-02: qty 4

## 2016-03-02 NOTE — Telephone Encounter (Signed)
Left message with patient to return my call. This is  regarding pain pump and bruising

## 2016-03-03 ENCOUNTER — Encounter: Payer: Self-pay | Admitting: Hematology and Oncology

## 2016-03-03 ENCOUNTER — Telehealth: Payer: Self-pay | Admitting: *Deleted

## 2016-03-03 NOTE — Telephone Encounter (Signed)
sw pt spouse made her aware that the pt needs to come in on 03/23/16 @ 11am instead of 1:40pm. She will made her husband aware...td

## 2016-03-10 ENCOUNTER — Encounter: Payer: Self-pay | Admitting: Hematology and Oncology

## 2016-03-15 ENCOUNTER — Other Ambulatory Visit: Payer: Self-pay

## 2016-03-15 ENCOUNTER — Inpatient Hospital Stay: Payer: Medicare Other | Attending: Hematology and Oncology

## 2016-03-15 ENCOUNTER — Telehealth: Payer: Self-pay | Admitting: Hematology and Oncology

## 2016-03-15 DIAGNOSIS — Z9221 Personal history of antineoplastic chemotherapy: Secondary | ICD-10-CM | POA: Insufficient documentation

## 2016-03-15 DIAGNOSIS — E785 Hyperlipidemia, unspecified: Secondary | ICD-10-CM | POA: Insufficient documentation

## 2016-03-15 DIAGNOSIS — M549 Dorsalgia, unspecified: Secondary | ICD-10-CM | POA: Diagnosis not present

## 2016-03-15 DIAGNOSIS — F419 Anxiety disorder, unspecified: Secondary | ICD-10-CM | POA: Diagnosis not present

## 2016-03-15 DIAGNOSIS — Z87311 Personal history of (healed) other pathological fracture: Secondary | ICD-10-CM | POA: Insufficient documentation

## 2016-03-15 DIAGNOSIS — N400 Enlarged prostate without lower urinary tract symptoms: Secondary | ICD-10-CM | POA: Insufficient documentation

## 2016-03-15 DIAGNOSIS — Z79899 Other long term (current) drug therapy: Secondary | ICD-10-CM | POA: Diagnosis not present

## 2016-03-15 DIAGNOSIS — Z87891 Personal history of nicotine dependence: Secondary | ICD-10-CM | POA: Diagnosis not present

## 2016-03-15 DIAGNOSIS — I4891 Unspecified atrial fibrillation: Secondary | ICD-10-CM | POA: Diagnosis not present

## 2016-03-15 DIAGNOSIS — Z9484 Stem cells transplant status: Secondary | ICD-10-CM | POA: Insufficient documentation

## 2016-03-15 DIAGNOSIS — K219 Gastro-esophageal reflux disease without esophagitis: Secondary | ICD-10-CM | POA: Diagnosis not present

## 2016-03-15 DIAGNOSIS — C9002 Multiple myeloma in relapse: Secondary | ICD-10-CM | POA: Insufficient documentation

## 2016-03-15 DIAGNOSIS — Z86711 Personal history of pulmonary embolism: Secondary | ICD-10-CM | POA: Diagnosis not present

## 2016-03-15 DIAGNOSIS — G8929 Other chronic pain: Secondary | ICD-10-CM | POA: Insufficient documentation

## 2016-03-15 DIAGNOSIS — I10 Essential (primary) hypertension: Secondary | ICD-10-CM | POA: Insufficient documentation

## 2016-03-15 DIAGNOSIS — Z8673 Personal history of transient ischemic attack (TIA), and cerebral infarction without residual deficits: Secondary | ICD-10-CM | POA: Diagnosis not present

## 2016-03-15 DIAGNOSIS — Z7901 Long term (current) use of anticoagulants: Secondary | ICD-10-CM | POA: Diagnosis not present

## 2016-03-15 DIAGNOSIS — Z5112 Encounter for antineoplastic immunotherapy: Secondary | ICD-10-CM | POA: Diagnosis not present

## 2016-03-15 LAB — CBC WITH DIFFERENTIAL/PLATELET
Basophils Absolute: 0.1 10*3/uL (ref 0–0.1)
Basophils Relative: 1 %
Eosinophils Absolute: 0.1 10*3/uL (ref 0–0.7)
Eosinophils Relative: 1 %
HCT: 36.7 % — ABNORMAL LOW (ref 40.0–52.0)
Hemoglobin: 12.5 g/dL — ABNORMAL LOW (ref 13.0–18.0)
Lymphocytes Relative: 9 %
Lymphs Abs: 0.7 10*3/uL — ABNORMAL LOW (ref 1.0–3.6)
MCH: 34.2 pg — ABNORMAL HIGH (ref 26.0–34.0)
MCHC: 34 g/dL (ref 32.0–36.0)
MCV: 100.7 fL — ABNORMAL HIGH (ref 80.0–100.0)
Monocytes Absolute: 1.1 10*3/uL — ABNORMAL HIGH (ref 0.2–1.0)
Monocytes Relative: 14 %
Neutro Abs: 5.8 10*3/uL (ref 1.4–6.5)
Neutrophils Relative %: 75 %
Platelets: 173 10*3/uL (ref 150–440)
RBC: 3.65 MIL/uL — ABNORMAL LOW (ref 4.40–5.90)
RDW: 16.2 % — ABNORMAL HIGH (ref 11.5–14.5)
WBC: 7.7 10*3/uL (ref 3.8–10.6)

## 2016-03-15 LAB — COMPREHENSIVE METABOLIC PANEL
ALT: 14 U/L — ABNORMAL LOW (ref 17–63)
AST: 13 U/L — ABNORMAL LOW (ref 15–41)
Albumin: 3.8 g/dL (ref 3.5–5.0)
Alkaline Phosphatase: 41 U/L (ref 38–126)
Anion gap: 8 (ref 5–15)
BUN: 15 mg/dL (ref 6–20)
CO2: 25 mmol/L (ref 22–32)
Calcium: 9.4 mg/dL (ref 8.9–10.3)
Chloride: 106 mmol/L (ref 101–111)
Creatinine, Ser: 1.38 mg/dL — ABNORMAL HIGH (ref 0.61–1.24)
GFR calc Af Amer: 59 mL/min — ABNORMAL LOW (ref >60)
GFR calc non Af Amer: 51 mL/min — ABNORMAL LOW (ref >60)
Glucose, Bld: 121 mg/dL — ABNORMAL HIGH (ref 65–99)
Potassium: 4.5 mmol/L (ref 3.5–5.1)
Sodium: 139 mmol/L (ref 135–145)
Total Bilirubin: 0.6 mg/dL (ref 0.3–1.2)
Total Protein: 6 g/dL — ABNORMAL LOW (ref 6.5–8.1)

## 2016-03-15 MED ORDER — PAIN MANAGEMENT IT PUMP REFILL: 1.0000 | Freq: Once | INTRATHECAL | Status: DC

## 2016-03-15 NOTE — Telephone Encounter (Signed)
Wife states that she has not heard from Logan Perez after she was going to speak to Dr. Evelene Croon about transplant. He is going to have it per Dr. Evelene Croon Sept/Oct.  He needs the cataract surgery because his vision is so bad and it can be done in July and they want to know if that is ok.  I did tell her from my recollection he was told to wait until after transplant but wife states with the transplant further away and vision is so bad she wonders if it would be ok to do cataracts first in July and then wait for the transplant 2-3 months which is the sch. They have been given from [place] standpoint.  Please call back with response

## 2016-03-15 NOTE — Telephone Encounter (Signed)
Please call them today when you have time to answer questions about his stem cell surgery and cataract surgery. Thanks!

## 2016-03-16 ENCOUNTER — Other Ambulatory Visit: Payer: Self-pay | Admitting: Hematology and Oncology

## 2016-03-16 ENCOUNTER — Inpatient Hospital Stay: Payer: Medicare Other

## 2016-03-16 VITALS — BP 129/85 | HR 78 | Resp 20

## 2016-03-16 DIAGNOSIS — Z5112 Encounter for antineoplastic immunotherapy: Secondary | ICD-10-CM | POA: Diagnosis not present

## 2016-03-16 DIAGNOSIS — C9002 Multiple myeloma in relapse: Secondary | ICD-10-CM

## 2016-03-16 MED ORDER — DIPHENHYDRAMINE HCL 25 MG PO CAPS
50.0000 mg | ORAL_CAPSULE | Freq: Once | ORAL | Status: AC
  Administered 2016-03-16: 50 mg via ORAL
  Filled 2016-03-16: qty 2

## 2016-03-16 MED ORDER — SODIUM CHLORIDE 0.9 % IV SOLN
1200.0000 mg | Freq: Once | INTRAVENOUS | Status: AC
  Administered 2016-03-16: 1200 mg via INTRAVENOUS
  Filled 2016-03-16: qty 60

## 2016-03-16 MED ORDER — ACETAMINOPHEN 325 MG PO TABS
650.0000 mg | ORAL_TABLET | Freq: Once | ORAL | Status: AC
  Administered 2016-03-16: 650 mg via ORAL
  Filled 2016-03-16: qty 2

## 2016-03-16 MED ORDER — SODIUM CHLORIDE 0.9 % IV SOLN
Freq: Once | INTRAVENOUS | Status: AC
  Administered 2016-03-16: 09:00:00 via INTRAVENOUS
  Filled 2016-03-16: qty 1000

## 2016-03-16 MED ORDER — SODIUM CHLORIDE 0.9 % IV SOLN
Freq: Once | INTRAVENOUS | Status: AC
  Administered 2016-03-16: 10:00:00 via INTRAVENOUS
  Filled 2016-03-16: qty 4

## 2016-03-23 ENCOUNTER — Ambulatory Visit: Payer: Medicare Other | Attending: Pain Medicine | Admitting: Pain Medicine

## 2016-03-23 ENCOUNTER — Encounter: Payer: Self-pay | Admitting: Pain Medicine

## 2016-03-23 VITALS — BP 130/56 | HR 83 | Temp 98.2°F | Resp 18 | Ht 67.0 in | Wt 172.0 lb

## 2016-03-23 DIAGNOSIS — G893 Neoplasm related pain (acute) (chronic): Secondary | ICD-10-CM | POA: Insufficient documentation

## 2016-03-23 DIAGNOSIS — E7801 Familial hypercholesterolemia: Secondary | ICD-10-CM | POA: Diagnosis not present

## 2016-03-23 DIAGNOSIS — I081 Rheumatic disorders of both mitral and tricuspid valves: Secondary | ICD-10-CM | POA: Insufficient documentation

## 2016-03-23 DIAGNOSIS — Z86711 Personal history of pulmonary embolism: Secondary | ICD-10-CM | POA: Insufficient documentation

## 2016-03-23 DIAGNOSIS — Z7901 Long term (current) use of anticoagulants: Secondary | ICD-10-CM | POA: Diagnosis not present

## 2016-03-23 DIAGNOSIS — N401 Enlarged prostate with lower urinary tract symptoms: Secondary | ICD-10-CM | POA: Diagnosis not present

## 2016-03-23 DIAGNOSIS — C9002 Multiple myeloma in relapse: Secondary | ICD-10-CM | POA: Insufficient documentation

## 2016-03-23 DIAGNOSIS — M4854XA Collapsed vertebra, not elsewhere classified, thoracic region, initial encounter for fracture: Secondary | ICD-10-CM | POA: Insufficient documentation

## 2016-03-23 DIAGNOSIS — A09 Infectious gastroenteritis and colitis, unspecified: Secondary | ICD-10-CM | POA: Diagnosis not present

## 2016-03-23 DIAGNOSIS — E782 Mixed hyperlipidemia: Secondary | ICD-10-CM | POA: Diagnosis not present

## 2016-03-23 DIAGNOSIS — M8580 Other specified disorders of bone density and structure, unspecified site: Secondary | ICD-10-CM | POA: Diagnosis not present

## 2016-03-23 DIAGNOSIS — C9000 Multiple myeloma not having achieved remission: Secondary | ICD-10-CM | POA: Diagnosis not present

## 2016-03-23 DIAGNOSIS — M6249 Contracture of muscle, multiple sites: Secondary | ICD-10-CM

## 2016-03-23 DIAGNOSIS — Z9689 Presence of other specified functional implants: Secondary | ICD-10-CM | POA: Diagnosis not present

## 2016-03-23 DIAGNOSIS — G894 Chronic pain syndrome: Secondary | ICD-10-CM

## 2016-03-23 DIAGNOSIS — M549 Dorsalgia, unspecified: Secondary | ICD-10-CM | POA: Diagnosis present

## 2016-03-23 DIAGNOSIS — M5127 Other intervertebral disc displacement, lumbosacral region: Secondary | ICD-10-CM | POA: Insufficient documentation

## 2016-03-23 DIAGNOSIS — Z451 Encounter for adjustment and management of infusion pump: Secondary | ICD-10-CM | POA: Insufficient documentation

## 2016-03-23 DIAGNOSIS — M47816 Spondylosis without myelopathy or radiculopathy, lumbar region: Secondary | ICD-10-CM | POA: Insufficient documentation

## 2016-03-23 DIAGNOSIS — M4806 Spinal stenosis, lumbar region: Secondary | ICD-10-CM | POA: Diagnosis not present

## 2016-03-23 DIAGNOSIS — I480 Paroxysmal atrial fibrillation: Secondary | ICD-10-CM | POA: Insufficient documentation

## 2016-03-23 DIAGNOSIS — K219 Gastro-esophageal reflux disease without esophagitis: Secondary | ICD-10-CM | POA: Insufficient documentation

## 2016-03-23 DIAGNOSIS — Z79891 Long term (current) use of opiate analgesic: Secondary | ICD-10-CM | POA: Insufficient documentation

## 2016-03-23 DIAGNOSIS — R197 Diarrhea, unspecified: Secondary | ICD-10-CM | POA: Diagnosis not present

## 2016-03-23 DIAGNOSIS — A047 Enterocolitis due to Clostridium difficile: Secondary | ICD-10-CM | POA: Insufficient documentation

## 2016-03-23 DIAGNOSIS — S22080S Wedge compression fracture of T11-T12 vertebra, sequela: Secondary | ICD-10-CM

## 2016-03-23 DIAGNOSIS — M62838 Other muscle spasm: Secondary | ICD-10-CM

## 2016-03-23 DIAGNOSIS — G8929 Other chronic pain: Secondary | ICD-10-CM | POA: Insufficient documentation

## 2016-03-23 DIAGNOSIS — I10 Essential (primary) hypertension: Secondary | ICD-10-CM | POA: Diagnosis not present

## 2016-03-23 DIAGNOSIS — M4854XS Collapsed vertebra, not elsewhere classified, thoracic region, sequela of fracture: Secondary | ICD-10-CM

## 2016-03-23 DIAGNOSIS — N138 Other obstructive and reflux uropathy: Secondary | ICD-10-CM | POA: Insufficient documentation

## 2016-03-23 MED ORDER — BACLOFEN 10 MG PO TABS
10.0000 mg | ORAL_TABLET | Freq: Every day | ORAL | Status: DC
Start: 2016-03-23 — End: 2016-12-18

## 2016-03-23 NOTE — Progress Notes (Signed)
Safety precautions to be maintained throughout the outpatient stay will include: orient to surroundings, keep bed in low position, maintain call bell within reach at all times, provide assistance with transfer out of bed and ambulation.  

## 2016-03-23 NOTE — Patient Instructions (Signed)
A prescription for Baclofen was sent to your pharmacy.

## 2016-03-23 NOTE — Progress Notes (Signed)
Patient's Name: Logan Perez  Patient type: Established  MRN: [id number]  Service setting: Ambulatory outpatient  DOB: 1945/10/29  Location: [facility]  DOS: 03/23/2016  Primary Care Physician: Sherrin Daisy, MD  Note by: Kathlen Brunswick. Dossie Arbour, M.D, DABA, DABAPM, DABPM, DABIPP, FIPP  Referring Physician: Sherrin Daisy, MD  Specialty: Board-Certified Interventional Pain Management  Last Visit to Pain Management: 03/03/2016   Primary Reason(s) for Visit: Encounter for prescription drug management (Level of risk: moderate) CC: Back Pain   HPI  Mr. Knaus is a 70 y.o. year old, male patient, who returns today as an established patient. He has BPH with obstruction/lower urinary tract symptoms; Multiple myeloma ([place]); Diarrhea; Clostridium difficile diarrhea; Essential (primary) hypertension; Acid reflux; Combined fat and carbohydrate induced hyperlipemia; MI (mitral incompetence); TI (tricuspid incompetence); Paroxysmal atrial fibrillation ([place]); Pulmonary embolism ([place]); Breathlessness on exertion; Chronic pain; Presence of intrathecal pump; Long term current use of opiate analgesic; Long term prescription opiate use; Opiate use; Encounter for therapeutic drug level monitoring; Night muscle spasms; Muscle spasticity; Neuropathic pain; Neurogenic pain; Chronic low back pain; Lumbar spondylosis; Compression fracture of T12 vertebra (HCC) (70-75% magnitude) (with mild retropulsion); Diffuse myofascial pain syndrome; Presence of implanted infusion pump ([employer], programmable, intrathecal pump); Cancer associated pain; Encounter for interrogation of infusion pump; Chronic lumbar radicular pain; Opiate analgesic contract exists; Chronic pain syndrome; Chronic anticoagulation (Eliquis); Hypomagnesemia; Ejection fraction < 50%; and Encounter for adjustment or management of infusion pump on his problem list.. His primarily concern today is the Back Pain   Pain  Assessment: Self-Reported Pain Score: 1  Reported level is compatible with observation       Pain Type: Chronic pain Pain Location: Back Pain Orientation: Lower Pain Descriptors / Indicators: Nagging Pain Frequency: Constant  The patient comes into the clinics today for pharmacological management of his chronic pain. I last saw this patient on 70/13/2017. The patient  reports that he does not use illicit drugs. His body mass index is 26.93 kg/(m^2).  Date of Last Visit: 12/08/15 Service Provided on Last Visit:  (pump refill)  Controlled Substance Pharmacotherapy Assessment & REMS (Risk Evaluation and Mitigation Strategy)  Analgesic: Intrathecal pump medication. Pharmacokinetics: N/A Pharmacodynamics: Analgesic Effect: More than 50% Activity Facilitation: Medication(s) allow patient to sit, stand, walk, and do the basic ADLs Perceived Effectiveness: Described as relatively effective, allowing for increase in activities of daily living (ADL) Side-effects or Adverse reactions: None reported Monitoring: [place] PMP: Online review of the past 78-monthperiod conducted. Compliant with practice rules and regulations Last UDS on record: TOXASSURE SELECT 13  Date Value Ref Range Status  08/26/2015 FINAL  Final    Comment:    ==================================================================== TOXASSURE SELECT 13 (MW) ==================================================================== Test                             Result       Flag       Units Drug Present not Declared for Prescription Verification   Fentanyl                       19           UNEXPECTED ng/mg creat   Norfentanyl                    26           UNEXPECTED ng/mg creat    Source of fentanyl is a scheduled  prescription medication,    including IV, patch, and transmucosal formulations. Norfentanyl    is an expected metabolite of fentanyl. Drug Absent but Declared for Prescription Verification   Alprazolam                      Not Detected UNEXPECTED ng/mg creat ==================================================================== Test                      Result    Flag   Units      Ref Range   Creatinine              77               mg/dL      >=20 ==================================================================== Declared Medications:  The flagging and interpretation on this report are based on the  following declared medications.  Unexpected results may arise from  inaccuracies in the declared medications.  **Note: The testing scope of this panel includes these medications:  Alprazolam (Xanax)  **Note: The testing scope of this panel does not include following  reported medications:  Baclofen ==================================================================== For clinical consultation, please call [phone]. ====================================================================    UDS interpretation: Compliant          Medication Assessment Form: Reviewed. Patient indicates being compliant with therapy Treatment compliance: Compliant Risk Assessment: Aberrant Behavior: None observed today Substance Use Disorder (SUD) Risk Level: Low Risk of opioid abuse or dependence: 0.7-3.0% with doses ? 36 MME/day and 6.1-26% with doses ? 120 MME/day. Opioid Risk Tool (ORT) Score: Total Score: 0 Low Risk for SUD (Score <3) Depression Scale Score: PHQ-2: PHQ-2 Total Score: 0 No depression (0) PHQ-9: PHQ-9 Total Score: 0 No depression (0-4)  Pharmacologic Plan: No change in therapy, at this time  Laboratory Chemistry  Inflammation Markers No results found for: ESRSEDRATE, CRP  Renal Function Lab Results  Component Value Date   BUN 15 03/15/2016   CREATININE 1.38* 03/15/2016   GFRAA 59* 03/15/2016   GFRNONAA 51* 03/15/2016    Hepatic Function Lab Results  Component Value Date   AST 13* 03/15/2016   ALT 14* 03/15/2016   ALBUMIN 3.8 03/15/2016    Electrolytes Lab Results  Component Value Date    NA 139 03/15/2016   K 4.5 03/15/2016   CL 106 03/15/2016   CALCIUM 9.4 03/15/2016   MG 1.8 03/01/2016    Pain Modulating Vitamins No results found for: VD25OH, VD125OH2TOT, GY6948NI6, EV0350KX3, VITAMINB12  Coagulation Parameters Lab Results  Component Value Date   INR 0.97 02/09/2016   LABPROT 13.1 02/09/2016   APTT 25 02/09/2016   PLT 173 03/15/2016    Note: Labs Reviewed.  Recent Diagnostic Imaging  Ct Biopsy  02/09/2016  INDICATION: 71 year old male with a history of relapsed multiple myeloma. EXAM: CT BIOPSY MEDICATIONS: None. ANESTHESIA/SEDATION: Moderate (conscious) sedation was employed during this procedure. A total of Versed 3.0 mg and Fentanyl 100 mcg was administered intravenously. Moderate Sedation Time: 17 minutes. The patient's level of consciousness and vital signs were monitored continuously by radiology nursing throughout the procedure under my direct supervision. FLUOROSCOPY TIME:  CT COMPLICATIONS: None PROCEDURE: The procedure risks, benefits, and alternatives were explained to the patient. Questions regarding the procedure were encouraged and answered. The patient understands and consents to the procedure. Scout CT of the pelvis was performed for surgical planning purposes. The posterior pelvis was prepped with Betadinein a sterile fashion, and a sterile drape was applied covering the operative field. A  sterile gown and sterile gloves were used for the procedure. Local anesthesia was provided with 1% Lidocaine. We targeted the right posterior iliac bone for biopsy. The skin and subcutaneous tissues were infiltrated with 1% lidocaine without epinephrine. A small stab incision was made with an 11 blade scalpel, and an 11 gauge Murphy needle was advanced with CT guidance to the posterior cortex. Manual forced was used to advance the needle through the posterior cortex and the stylet was removed. A bone marrow aspirate was retrieved and passed to a cytotechnologist in the  room. The Murphy needle was then advanced without the stylet for a core biopsy. The core biopsy was retrieved and also passed to a cytotechnologist. Manual pressure was used for hemostasis and a sterile dressing was placed. No complications were encountered no significant blood loss was encountered. Patient tolerated the procedure well and remained hemodynamically stable throughout. IMPRESSION: Status post CT-guided bone marrow biopsy, with tissue specimen sent to pathology for complete histopathologic analysis Signed, Dulcy Fanny. Earleen Newport, DO [facility] Electronically Signed   By: Corrie Mckusick D.O.   On: 02/09/2016 14:30   Thoracic Imaging: Thoracic MR w/wo contrast:  Results for orders placed in visit on 03/09/09  MR Thoracic Spine W Wo Contrast   Narrative * PRIOR REPORT IMPORTED FROM AN EXTERNAL SYSTEM *   PRIOR REPORT IMPORTED FROM THE SYNGO [place] EXAM:    multiple myeloma  back pain  COMMENTS:   PROCEDURE:     MMR - MMR THORACIC SPINE WO/W  - Mar 22 2009  1:10PM   RESULT:     Sagittal T1 pre- and postgadolinium images as well as T2  weighted images were obtained. Axial imaging was performed as well. The  patient is complaining of low back discomfort and has a known history of  multiple myeloma.   There is a high-grade wedge compression of the body of T11 and of L1. The  loss of height anteriorly is approximately 80% at both levels. Definite  retropulsed bony fragments are not demonstrated on the sagittal images.  There is mild superior endplate depression of R97. There are marrow signal  changes in the bodies of T4 and T5 which likely reflect hemangiomas.   At the L1 level where a high-grade wedge compression is demonstrated the  AP  dimension of the CSF filled thecal sac remains 12 mm are. Just above and  below the L1 level the AP dimension of the thecal sac is approximately 16  mm. At the T10-T11 disc  level there is slight bulging noted to the left of  midline. The AP dimension of the thecal sac measures approximately 12 mm  and  no high-grade neural foraminal encroachment is seen.    On the fluid sensitive T2 weighted images I do not see evidence of bone  marrow edema. On the post gadolinium images I do not see significant  abnormal enhancement within the thoracic spine.   IMPRESSION:  1. There are high-grade wedge compressions the bodies of T11 and L1  without  evidence of high-grade AP dimensional spinal canal stenosis. The marrow  signal does not suggest active processes.  2. 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 I do not see evidence of spinal stenosis in the upper or mid  thoracic spine either.       Thoracic CT w/wo contrast:  Results for orders placed in visit on 08/08/10  CT T Spine Ltd Wo Or W/ Cm   Narrative * PRIOR REPORT IMPORTED FROM AN EXTERNAL SYSTEM *   PRIOR REPORT IMPORTED FROM THE SYNGO WORKFLOW SYSTEM   REASON FOR EXAM:    THIN CUT Imaging  T7-L5  L1 compression fracture hx of  multiple myeloma  COMMENTS:   PROCEDURE:     MCT - MCT THORACIC SPINE WO  - Aug 08 2010  3:07PM   RESULT:     Multiplanar imaging of the thoracic spine was obtained  utilizing  helical 0.6, 3 mm and 2 mm helical acquisition and bone reconstruction  algorithm.   Compression deformities are appreciated involving T5, T10 and T12. The  osseous structures appear sclerotic and these fractures have the  appearance  of chronic deformities. The fracture at T5 appears to be on the magnitude  of  35 to 40% and at T10 55 to 60% and at T12 50 to 55%. There is evidence of  minimal retropulsion of the superior aspect of the posterior wall of T10  as  well as T12. Mild narrowing of the canal is appreciated at these levels.  No  further evidence of compression deformities are identified within the  visualized portions of the thoracic spine.  The osseous structures are  osteopenic. The marrow demonstrates a moth-eaten appearance consistent  with  the patient's reported history of multiple myeloma.   IMPRESSION:   Multilevel compression disease within the thoracic spine as described  above  with areas of mild canal narrowing due to retropulsion. These fractures  appear chronic.   Thank you for the opportunity to contribute to the care of your patient.       Thoracic DG 2-3 views:  Results for orders placed in visit on 10/10/07  [facility] Thoracic Spine 2 View   Narrative * PRIOR REPORT IMPORTED FROM AN EXTERNAL SYSTEM *   PRIOR REPORT IMPORTED FROM THE SYNGO [place] EXAM:    multiple myeloma  COMMENTS:   PROCEDURE:     MDR - MDR THORACIC AP AND LATERAL  - Oct 29 2007 12:09PM   RESULT:     There is a 60% vertebral compression deformity of T11. This is  not changed appreciably since prior metastatic survey of 03/01/2007. Since  the prior exam, there has developed a 70% vertebral compression deformity  of  L1. No other definite fractures are seen. There is irregularity of the  superior vertebral plate of T01, but no actual compression deformity is  seen. The thoracic spine shows demineralization which could be secondary  to  multiple myeloma or to osteoporosis.   IMPRESSION:  1. There is a stable vertebral compression deformity of T11.  2. There is a 70% vertebral compression deformity of L1 that has developed  in the interval since the prior metastatic bone survey of 03/01/2007.   Thank you for the opportunity to contribute to the care of your patient.       Lumbosacral Imaging: Lumbar MR wo contrast:  Results for orders placed in visit on 07/09/10  MR L Spine Ltd W/O Cm   Narrative * PRIOR REPORT IMPORTED FROM AN EXTERNAL SYSTEM *   PRIOR REPORT IMPORTED FROM THE SYNGO WORKFLOW SYSTEM   REASON FOR EXAM:    MULTIPLE MYELOMA BACK PAIN  COMMENTS:   PROCEDURE:     MMR - MMR LUMBAR SPINE WO  CONTRAST  - Jul 21 2010  9:19AM   RESULT:   TECHNIQUE: Multiplanar and multisequence  imaging of the lumbar spine was  obtained without administration of Gadolinium.   FINDINGS: Evaluation of the osseous structures demonstrates a compression  deformity involving T12. This appears to be on the magnitude of 50 to 55%  wedge compression anteriorly. There is intermediate signal within the  marrow  on the fat/sat imaging and these findings are consistent with a subacute  to  more chronic recurrence. There is retropulsion of the posterior aspect of  the superior portion of the posterior wall with near complete effacement  of  the anterior thecal sac. There does appear to be mild mass effect upon the  cord. There is mild to slightly moderate thecal sac narrowing at this  level.  The osseous structures also demonstrate superior end-plate depression  involving L4. The vertebral body height is otherwise maintained.   The conus medullaris terminates at L1 level. Cauda equina demonstrate no  evidence of clumping or thickening.   At the T12-L1 level, there is no evidence of neuroforaminal narrowing or  significant thecal sac stenosis.   At the L1-2 and L2-3 levels, there is no evidence of significant thecal  sac  stenosis or significant neuroforaminal narrowing.   At the L3-4 level, a broad-based disc bulge is appreciated causing partial  effacement of the anterior CSF space. There is lateralization of the disc  bulge and mild bilateral neuroforaminal narrowing with possible exiting  nerve root compromise.   At the L4-5 level, moderate to severe degenerative disc disease is  appreciated with near complete effacement of the disc space. End-plate  hypertrophic spurring and a minimal disc bulge causes subligamentous bulge  of the anterior thecal sac. The neural foramina appear patent.   At the L5-S1 level, there is no evidence of thecal sac stenosis. End-plate  hypertrophic spurring is  identified on the left as well as partial  lateralization of the disc bulge. This causes mass effect upon the central  exiting S1 nerve root on the left with possible compromise.   IMPRESSION:   1.     Likely subacute to chronic wedge compression deformity involving  T12.  There is mild retropulsion of the posterior wall as described above.  2.     Areas of mild thecal sac stenosis as described above with mild  neuroforaminal narrowing.  3.     Asymmetric focal disc protrusion at L5-S1 causing mass effect upon  the exiting S1 nerve root sheath. The mass effect also appears to be  second  to end-plate hypertrophic spurring. Exiting nerve root compromise is of  diagnostic consideration.   Thank you for this opportunity to contribute to the care of your patient.       Lumbar MR w/wo contrast:  Results for orders placed in visit on 04/24/09  MR Lumbar Spine W Wo Contrast   Narrative * PRIOR REPORT IMPORTED FROM AN EXTERNAL SYSTEM *   PRIOR REPORT IMPORTED FROM THE SYNGO WORKFLOW SYSTEM   REASON FOR EXAM:    Low Back Pain  Eval Compression Fx Pt had surgery  COMMENTS:  [id number]   PROCEDURE:     MR  - MR LUMBAR SPINE WO/W  - Apr 24 2009  9:37AM   RESULT:        Multiplanar/multisequence imaging of the lumbar spine was  obtained without the administration of gadolinium.   Compression deformity is appreciated involving T12.This appears to be on  the  magnitude of 70 to 75%.  This has a subacute to chronic appearance. There  is  no evidence of significant marrow edema.   Mild retropulsion versus an osteophyte is identified along the posterior  aspect of the superior endplate.  The conus medullaris terminates at an L1  level. The cauda equina demonstrate no evidence of clumping or thickening.   At the T11-12, T12-L1, L1-2 levels there is no evidence of thecal sac  stenosis or neural foraminal narrowing.   At the L2-3 level, a mild broad-based disc bulge is appreciated which   demonstrates mild effacement of the anterior CSF space.  There is mild  bilateral neural foraminal narrowing without evidence of exiting nerve  root  compression.   At the L3-4 disc space level, a broad-based disc bulge is appreciated  which  causes mild effacement of the anterior CSF space.  There is lateralization  to the right and left with moderate neural foraminal narrowing on the  right  and mild to moderate on the left. Exiting nerve root compromise  bilaterally  is a diagnostic consideration.   At the L4-5 disc space level, there is obliteration of the disc space  without evidence of significant thecal sac stenosis.  There is no evidence  of neural foraminal narrowing.   At the L5-S1 level, there is no evidence of thecal sac stenosis or neural  foraminal narrowing.   IMPRESSION:   1.     Compression deformity involving T12 as described above with mild  retropulsion.  2.     Mild thecal sac stenosis at L3-4.  There is bilateral mild to  moderate neural foraminal narrowing with exiting nerve root compromise.  Mild  compression cannot be excluded.  3.     At the L4-5 level, mild neural foraminal narrowing on the right is  appreciated.  Exiting nerve root compromise cannot be excluded.   Thank you for the opportunity to contribute to the care of your patient.       Lumbar CT wo contrast:  Results for orders placed in visit on 08/08/10  CT Lumbar Spine Wo Contrast   Narrative * PRIOR REPORT IMPORTED FROM AN EXTERNAL SYSTEM *   PRIOR REPORT IMPORTED FROM THE SYNGO WORKFLOW SYSTEM   REASON FOR EXAM:    THIN CUT Imaging  T7-L5  L1 compression fracture hx of  multiple myeloma  COMMENTS:   PROCEDURE:     MCT - MCT LUMBAR SPINE WO  - Aug 08 2010  3:05PM   RESULT:   TECHNIQUE:  Multiplanar imaging of the lumbar spine was obtained utilizing  helical 1.5, 2, and 3 millimeters acquisition as well as bone  reconstruction  algorithm.   FINDINGS:  A compression  deformity is appreciated involving T12 which  appears to be on the magnitude of 50 to 55%. The superior endplate  demonstrates diffuse sclerosis. This fracture has the appearance of a  chronic compression.  Superior endplate depression is appreciated  involving  L4 with approximately 15 to 20% central depression. There is minimal  retropulsion of the superior aspects of the posterior endplate. Minimal  thecal sac narrowing results from this area of retropulsion. The lumbar  canal is otherwise maintained.   The osseous structures appear diffusely osteopenic. The marrow otherwise  demonstrates diffuse moth-eaten appearance consistent with the patient's  reported history of multiple myeloma. No further compression deformities  are  identified within the lumbar spine. Degenerative disc disease changes are  appreciated at the L4-L5 level with disc space obliteration, endplate  hypertrophic spurring and endplate sclerosis. Mild degenerative disc  disease  changes  are appreciated at L5-S1. There does not appear to be significant  neuroforaminal narrowing. Multilevel facet sclerosis is appreciated as  well  as areas of facet hypertrophy.   IMPRESSION:  1. Multilevel spondylolysis.  2. Compression deformity involving T12 which appears to be chronic.  3. Superior endplate depression of L4 also with a chronic appearance.  4. Diffuse multilevel osteopenia.  5. Findings consistent with the patient's history of multiple myeloma.  6. Severe degenerative disc disease at L4-L5.   Thank you for the opportunity to contribute to the care of your patient.       Note: Imaging reviewed.  Meds  The patient has a current medication list which includes the following prescription(s): alprazolam, atorvastatin, baclofen, calcium citrate-vitamin d, cetirizine, vitamin d3, cyanocobalamin, daratumumab, dexamethasone, diltiazem, eliquis, fluticasone, iron-vitamins, loperamide, montelukast, omeprazole, PAIN  MANAGEMENT IT PUMP REFILL, potassium chloride sa, ranitidine, tamsulosin, and valacyclovir.  Current Outpatient Prescriptions on File Prior to Visit  Medication Sig  . ALPRAZolam (XANAX) 0.25 MG tablet TAKE 1 TABLET BY MOUTH EVERY NIGHT AT BEDTIME AS NEEDED FOR ANXIETY.  [name] atorvastatin (LIPITOR) 10 MG tablet Take by mouth daily at 6 PM.   . calcium citrate-vitamin D (CITRACAL+D) 315-200 MG-UNIT per tablet Take 1 tablet by mouth daily.   . cetirizine (ZYRTEC) 10 MG tablet Take 10 mg by mouth daily.   . Cholecalciferol (VITAMIN D3) 2000 UNITS capsule Take 2,000 Units by mouth daily.   . Cyanocobalamin (RA VITAMIN B-12 TR) 1000 MCG TBCR Take 1,000 mcg by mouth daily.   [name] dexamethasone (DECADRON) 4 MG tablet TAKE 4 TABLETS BY MOUTH ON THE DAY OF TREATMENT AND 1 TABLET BY MOUTH ON THE DAY AFTER TREATMENT  . diltiazem (CARDIZEM CD) 120 MG 24 hr capsule TAKE 1 CAPSULE ONCE DAILY  . ELIQUIS 5 MG TABS tablet TAKE 1 TABLET(5 MG) BY MOUTH TWICE DAILY  . fluticasone (FLONASE) 50 MCG/ACT nasal spray Place into the nose.  . Iron-Vitamins (GERITOL PO) Take by mouth.  . loperamide (IMODIUM) 2 MG capsule Take 2 mg by mouth as needed for diarrhea or loose stools. Reported on 03/01/2016  . montelukast (SINGULAIR) 10 MG tablet Take once a day as directed by MD (per treatment schedule). (Patient taking differently: Take 10 mg by mouth at bedtime. Take once a day as directed by MD (per treatment schedule).)  . omeprazole (PRILOSEC) 20 MG capsule Take 20 mg by mouth daily.   [name] PAIN MANAGEMENT IT PUMP REFILL 1 each by Intrathecal route once. Medication: PF Fentanyl 1,500.0 mcg/ml PF Bupivicaine 30.0 mg/ml PF Clonidine 300.27mg/ml Total Volume: 40 ml Needed by 03-23-16 '@1340'   . potassium chloride SA (K-DUR,KLOR-CON) 20 MEQ tablet TAKE 1 TABLET(20 MEQ) BY MOUTH TWICE DAILY  . ranitidine (ZANTAC) 150 MG tablet Take 150 mg by mouth.  . tamsulosin (FLOMAX) 0.4 MG CAPS capsule Take 1 capsule (0.4 mg total) by mouth daily.   . valACYclovir (VALTREX) 500 MG tablet TAKE 1 TABLET BY MOUTH DAILY   No current facility-administered medications on file prior to visit.    ROS  Constitutional: Denies any fever or chills Gastrointestinal: No reported hemesis, hematochezia, vomiting, or acute GI distress Musculoskeletal: Denies any acute onset joint swelling, redness, loss of ROM, or weakness Neurological: No reported episodes of acute onset apraxia, aphasia, dysarthria, agnosia, amnesia, paralysis, loss of coordination, or loss of consciousness  Allergies  Mr. JCarreirois allergic to azithromycin; zometa; and rivaroxaban.  [place] [facility]:  Mr. JQazi has a past medical history of Pulmonary  embolism ([place]); Stroke [facility]); GERD (gastroesophageal reflux disease); Hypertension; Atrial fibrillation ([address]); HLD (hyperlipidemia); Multiple myeloma ([place]); Elevated PSA; BPH (benign prostatic hyperplasia); Difficulty voiding; and Anxiety. Family: family history includes Cancer in his father; Kidney disease in his sister. There is no history of Bladder Cancer or Prostate cancer. Surgical:  has past surgical history that includes Back surgery (1994); Knee arthroscopy (Left, 1992); Pain pump implantation (2012); and stem cell implant (2008). Tobacco:  reports that he has quit smoking. His smoking use included Cigarettes. He has a 30 pack-year smoking history. He does not have any smokeless tobacco history on file. Alcohol:  reports that he does not drink alcohol. Drug:  reports that he does not use illicit drugs.  Constitutional Exam  Vitals: Blood pressure 130/56, pulse 83, temperature 98.2 F (36.8 C), resp. rate 18, height '5\' 7"'  (1.702 m), weight 172 lb (78.019 kg), SpO2 100 %. General appearance: Well nourished, well developed, and well hydrated. In no acute distress Calculated BMI/Body habitus: Body mass index is 26.93 kg/(m^2). (25-29.9 kg/m2) Overweight - 20% higher incidence of chronic pain Psych/Mental status: Alert and  oriented x 3 (person, place, & time) Eyes: PERLA Respiratory: No evidence of acute respiratory distress  Cervical Spine Exam  Inspection: No masses, redness, or swelling Alignment: Symmetrical ROM: Functional: ROM is within functional limits [facility]) Stability: No instability detected Muscle strength & Tone: Functionally intact Sensory: Unimpaired Palpation: No complaints of tenderness  Upper Extremity (UE) Exam    Side: Right upper extremity  Side: Left upper extremity  Inspection: No masses, redness, swelling, or asymmetry  Inspection: No masses, redness, swelling, or asymmetry  ROM:  ROM:  Functional: ROM is within functional limits [facility])  Functional: ROM is within functional limits [facility])  Muscle strength & Tone: Functionally intact  Muscle strength & Tone: Functionally intact  Sensory: Unimpaired  Sensory: Unimpaired  Palpation: Non-contributory  Palpation: Non-contributory   Thoracic Spine Exam  Inspection: No masses, redness, or swelling Alignment: Symmetrical ROM: Functional: ROM is within functional limits [facility]) Stability: No instability detected Sensory: Unimpaired Muscle strength & Tone: Functionally intact Palpation: No complaints of tenderness  Lumbar Spine Exam  Inspection: No masses, redness, or swelling Alignment: Symmetrical ROM: Functional: ROM is within functional limits [employer]) Stability: No instability detected Muscle strength & Tone: Functionally intact Sensory: Unimpaired Palpation: No complaints of tenderness Provocative Tests: Lumbar Hyperextension and rotation test: deferred Patrick's Maneuver: deferred  Gait & Posture Assessment  Ambulation: Unassisted Gait: Unaffected Posture: WNL  Lower Extremity Exam    Side: Right lower extremity  Side: Left lower extremity  Inspection: No masses, redness, swelling, or asymmetry ROM:  Inspection: No masses, redness, swelling, or asymmetry ROM:  Functional: ROM is within functional limits [facility])   Functional: ROM is within functional limits [facility])  Muscle strength & Tone: Functionally intact  Muscle strength & Tone: Functionally intact  Sensory: Unimpaired  Sensory: Unimpaired  Palpation: Non-contributory  Palpation: Non-contributory   Procedure:  Intrathecal Drug Delivery System (IDDS):  Type: [place]erv[place] Refill [phone])       Region: Abdominal Laterality: Right-sided  Indications: 1. Cancer associated pain   2. Multiple myeloma, remission status unspecified ([place])   3. Chronic pain syndrome   4. Compression fracture of T12 vertebra, sequela   5. Long term current use of opiate analgesic   6. Encounter for adjustment or management of infusion pump   7. Night muscle spasms   8. Muscle spasticity     Pre-procedure Pain Score: 1/10 Reported level of pain is  compatible with clinical observations Post-procedure Pain Score: 1   Type of Pump: Medtronic Synchromed II (MRI-compatible) Delivery Route: Intrathecal Type of Pain Treated: Neuropathic/Nociceptive Primary Medication Class: Opioid/opiate  Medication, Concentration, Infusion Program, & Delivery Rate: Please see scanned programming printout.   Pre-Procedure Assessment:  Mr. Myhand is a 70 y.o. year old, male patient, seen today for interventional treatment. He has BPH with obstruction/lower urinary tract symptoms; Multiple myeloma ([place]); Diarrhea; Clostridium difficile diarrhea; Essential (primary) hypertension; Acid reflux; Combined fat and carbohydrate induced hyperlipemia; MI (mitral incompetence); TI (tricuspid incompetence); Paroxysmal atrial fibrillation ([place]); Pulmonary embolism ([place]); Breathlessness on exertion; Chronic pain; Presence of intrathecal pump; Long term current use of opiate analgesic; Long term prescription opiate use; Opiate use; Encounter for therapeutic drug level monitoring; Night muscle spasms; Muscle spasticity; Neuropathic pain; Neurogenic pain; Chronic low back pain; Lumbar spondylosis; Compression  fracture of T12 vertebra (HCC) (70-75% magnitude) (with mild retropulsion); Diffuse myofascial pain syndrome; Presence of implanted infusion pump (Medtronic, programmable, intrathecal pump); Cancer associated pain; Encounter for interrogation of infusion pump; Chronic lumbar radicular pain; Opiate analgesic contract exists; Chronic pain syndrome; Chronic anticoagulation (Eliquis); Hypomagnesemia; Ejection fraction < 50%; and Encounter for adjustment or management of infusion pump on his problem list.. His primarily concern today is the Back Pain   Pain Type: Chronic pain Pain Location: Back Pain Orientation: Lower Pain Descriptors / Indicators: Nagging Pain Frequency: Constant  Date of Last Visit: 12/08/15 Service Provided on Last Visit:  (pump refill)  Coagulation Parameters Lab Results  Component Value Date   INR 0.97 02/09/2016   LABPROT 13.1 02/09/2016   APTT 25 02/09/2016   PLT 173 03/15/2016    Verification of the correct person, correct site (including marking of site), and correct procedure were performed and confirmed by the patient.  Consent: Secured. Under the influence of no sedatives a written informed consent was obtained, after having provided information on the risks and possible complications. To fulfill our ethical and legal obligations, as recommended by the American Medical Association's Code of Ethics, we have provided information to the patient about our clinical impression; the nature and purpose of the treatment or procedure; the risks, benefits, and possible complications of the intervention; alternatives; the risk(s) and benefit(s) of the alternative treatment(s) or procedure(s); and the risk(s) and benefit(s) of doing nothing. The patient was provided information about the risks and possible complications associated with the procedure. These include, but are not limited to, failure to achieve desired goals, infection, bleeding, organ or nerve damage, allergic  reactions, paralysis, and death. In addition, the patient was informed that Medicine is not an exact science; therefore, there is also the possibility of unforeseen risks and possible complications that may result in a catastrophic outcome. The patient indicated having understood very clearly. We have given the patient no guarantees and we have made no promises. Enough time was given to the patient to ask questions, all of which were answered to the patient's satisfaction.  Consent Attestation: I, the ordering provider, attest that I have discussed with the patient the benefits, risks, side-effects, alternatives, likelihood of achieving goals, and potential problems during recovery for the procedure that I have provided informed consent.  Pre-Procedure Preparation: Safety Precautions: Allergies reviewed. Appropriate site, procedure, and patient were confirmed by following the Joint Commission's Universal Protocol (UP.01.01.01), in the form of a "Time Out". The patient was asked to confirm marked site and procedure, before commencing. The patient was asked about blood thinners, or active infections, both of which were denied.  Patient was assessed for positional comfort and all pressure points were checked before starting procedure. Allergies: He is allergic to azithromycin; zometa; and rivaroxaban.. Infection Control Precautions: Sterile technique used. Standard Universal Precautions were taken as recommended by the Department of [facility] for Disease Control and Prevention (CDC). Standard pre-surgical skin prep was conducted. Respiratory hygiene and cough etiquette was practiced. Hand hygiene observed. Safe injection practices and needle disposal techniques followed. SDV (single dose vial) medications used. Medications properly checked for expiration dates and contaminants. Personal protective equipment (PPE) used: Sterile surgical gloves. Monitoring:  Clinical observation Filed Vitals:    03/23/16 1140  BP: 130/56  Pulse: 83  Temp: 98.2 F (36.8 C)  Resp: 18  Height: '5\' 7"'  (1.702 m)  Weight: 172 lb (78.019 kg)  SpO2: 100%  Calculated BMI: Body mass index is 26.93 kg/(m^2).  Description of Procedure Process:   Time-out: "Time-out" completed before starting procedure, as per protocol. Position: Supine Target Area: Central-port of intrathecal pump. Approach: Anterior, 90 degree angle approach. Area Prepped: Entire Area around the pump implant. Prepping solution: ChloraPrep (2% chlorhexidine gluconate and 70% isopropyl alcohol) Safety Precautions: Aspiration looking for blood return was conducted prior to all injections. At no point did we inject any substances, as a needle was being advanced. No attempts were made at seeking any paresthesias. Safe injection practices and needle disposal techniques used. Medications properly checked for expiration dates. SDV (single dose vial) medications used. Description of the Procedure: Protocol guidelines were followed. Two nurses trained to do implant refills were present during the entire procedure. The refill medication was checked by both healthcare providers as well as the patient. The patient was included in the "Time-out" to verify the medication. The patient was placed in position. The pump was identified. The area was prepped in the usual manner. The sterile template was positioned over the pump, making sure the side-port location matched that of the pump. Both, the pump and the template were held for stability. The needle provided in the Medtronic Kit was then introduced thru the center of the template and into the central port. The pump content was aspirated and discarded volume documented. The new medication was slowly infused into the pump, thru the filter, making sure to avoid overpressure of the device. The needle was then removed and the area cleansed, making sure to leave some of the prepping solution back to take advantage of its  long term bactericidal properties. The pump was interrogated and programmed to reflect the correct medication, volume, and dosage. The program was printed and taken to the physician for approval. Once checked and signed by the physician, a copy was provided to the patient and another scanned into the EMR. EBL: None Materials Used: Medtronic Refill Kit  Imaging Guidance:  Type of Imaging Technique: None required.  Antibiotic Prophylaxis:  Indication(s): No indications identified. Type:  Antibiotics Given (last 72 hours)    None      Post-operative Assessment:  Complications: No immediate post-treatment complications were observed. Disposition: The patient tolerated the entire procedure well. No post-procedural neurological changes observed. The patient was discharged home, once institutional criteria were met. The patient was provided with post-procedure discharge instructions, including a section on how to identify potential problems. Should any problems arise concerning this procedure, the patient was given instructions to immediately contact us, at any time, without hesitation. Comments:  No additional relevant information.  Orders Placed This Encounter  Procedures  . PUMP REFILL    Maintain Protocol by  having two(2) healthcare providers during procedure and programming.    Scheduling Instructions:     Please refill intrathecal pump today.    Order Specific Question:  Where will this procedure be performed?    Answer:  [facility]  . PUMP REFILL    Whenever possible schedule on a procedure today.    Standing Status: Future     Number of Occurrences:      Standing Expiration Date: 08/20/2016    Scheduling Instructions:     Please schedule intrathecal pump refill based on pump programming. Avoid schedule intervals of more than 120 days (4 months).    Order Specific Question:  Where will this procedure be performed?    Answer:  [facility]   Assessment & Plan   Primary Diagnosis & Pertinent Problem List: The primary encounter diagnosis was Cancer associated pain. Diagnoses of Multiple myeloma, remission status unspecified ([place]), Chronic pain syndrome, Compression fracture of T12 vertebra, sequela, Long term current use of opiate analgesic, Encounter for adjustment or management of infusion pump, Night muscle spasms, and Muscle spasticity were also pertinent to this visit.  Visit Diagnosis: 1. Cancer associated pain   2. Multiple myeloma, remission status unspecified ([place])   3. Chronic pain syndrome   4. Compression fracture of T12 vertebra, sequela   5. Long term current use of opiate analgesic   6. Encounter for adjustment or management of infusion pump   7. Night muscle spasms   8. Muscle spasticity     Problems updated and reviewed during this visit: Problem  Encounter for Adjustment Or Management of Infusion Pump  Presence of Intrathecal Pump  Presence of implanted infusion pump (Medtronic, programmable, intrathecal pump)  Chronic anticoagulation (Eliquis)   Eliquis (apixaban) stop for 3 days prior to neuraxial blockade. May resume 6 hours after blockade.     Problem-specific Plan(s): No problem-specific assessment & plan notes found for this encounter.  No new assessment & plan notes have been filed under this hospital service since the last note was generated. Service: Pain Management   Plan of Care   Problem List Items Addressed This Visit      High   Cancer associated pain - Primary (Chronic)   Relevant Orders   PUMP REFILL   PUMP REFILL   Chronic pain syndrome (Chronic)   Relevant Orders   PUMP REFILL   PUMP REFILL   Compression fracture of T12 vertebra (HCC) (70-75% magnitude) (with mild retropulsion) (Chronic)   Relevant Orders   PUMP REFILL   PUMP REFILL   Multiple myeloma (HCC) (Chronic)   Relevant Medications   Daratumumab (DARZALEX IV)   Other Relevant Orders   PUMP REFILL   PUMP REFILL   Muscle  spasticity (Chronic)   Relevant Medications   baclofen (LIORESAL) 10 MG tablet   Night muscle spasms (Chronic)   Relevant Medications   baclofen (LIORESAL) 10 MG tablet     Medium   Encounter for adjustment or management of infusion pump   Long term current use of opiate analgesic (Chronic)       Pharmacotherapy (Medications Ordered): Meds ordered this encounter  Medications  . baclofen (LIORESAL) 10 MG tablet    Sig: Take 1 tablet (10 mg total) by mouth at bedtime.    Dispense:  30 each    Refill:  3    Do not place this medication, or any other prescription from our practice, on "Automatic Refill". Patient may have prescription filled one day early if pharmacy  is closed on scheduled refill date.    Lab-work & Procedure Ordered: Orders Placed This Encounter  Procedures  . PUMP REFILL  . PUMP REFILL    Imaging Ordered: None  Interventional Therapies: Scheduled:  Intrathecal pump refill    Referral(s) or Consult(s): None at this time.  New Prescriptions   No medications on file    Medications administered during this visit: Mr. Feggins had no medications administered during this visit.  Requested PM Follow-up: Return for Pump Refill: (Based on Program).  Future Appointments Date Time Provider [place]  03/29/2016 9:30 AM [facility] LAB [facility] None  03/29/2016 9:45 AM Lequita Asal, MD [facility] None  03/30/2016 8:30 AM [facility] INFUSION CHAIR 3 [facility] None  04/27/2016 9:00 AM Nori Riis, PA-C [facility] None  07/04/2016 11:00 AM Milinda Pointer, MD [facility] None    Primary Care Physician: Sherrin Daisy, MD Location: [facility] Facility Note by: Kathlen Brunswick. Dossie Arbour, M.D, DABA, DABAPM, DABPM, DABIPP, FIPP  Pain Score Disclaimer: We use the NRS-11 scale. This is a self-reported, subjective measurement of pain severity with only modest accuracy. It is used primarily to identify changes within a particular patient. It  must be understood that outpatient pain scales are significantly less accurate that those used for research, where they can be applied under ideal controlled circumstances with minimal exposure to variables. In reality, the score is likely to be a combination of pain intensity and pain affect, where pain affect describes the degree of emotional arousal or changes in action readiness caused by the sensory experience of pain. Factors such as social and work situation, setting, emotional state, anxiety levels, expectation, and prior pain experience may influence pain perception and show large inter-individual differences that may also be affected by time variables.  Patient instructions provided during this appointment: Patient Instructions  A prescription for Baclofen was sent to your pharmacy.

## 2016-03-24 ENCOUNTER — Telehealth: Payer: Self-pay

## 2016-03-24 NOTE — Telephone Encounter (Signed)
Post pump refill call.  Left message.  

## 2016-03-27 ENCOUNTER — Other Ambulatory Visit: Payer: Self-pay | Admitting: Hematology and Oncology

## 2016-03-29 ENCOUNTER — Encounter: Payer: Self-pay | Admitting: Hematology and Oncology

## 2016-03-29 ENCOUNTER — Inpatient Hospital Stay: Payer: Medicare Other

## 2016-03-29 ENCOUNTER — Inpatient Hospital Stay (HOSPITAL_BASED_OUTPATIENT_CLINIC_OR_DEPARTMENT_OTHER): Payer: Medicare Other | Admitting: Hematology and Oncology

## 2016-03-29 ENCOUNTER — Other Ambulatory Visit: Payer: Self-pay | Admitting: Hematology and Oncology

## 2016-03-29 VITALS — BP 130/83 | HR 108 | Temp 97.1°F | Resp 16 | Ht 67.0 in | Wt 169.1 lb

## 2016-03-29 DIAGNOSIS — C9002 Multiple myeloma in relapse: Secondary | ICD-10-CM

## 2016-03-29 DIAGNOSIS — Z9484 Stem cells transplant status: Secondary | ICD-10-CM | POA: Diagnosis not present

## 2016-03-29 DIAGNOSIS — I4891 Unspecified atrial fibrillation: Secondary | ICD-10-CM

## 2016-03-29 DIAGNOSIS — I10 Essential (primary) hypertension: Secondary | ICD-10-CM

## 2016-03-29 DIAGNOSIS — Z9221 Personal history of antineoplastic chemotherapy: Secondary | ICD-10-CM

## 2016-03-29 DIAGNOSIS — K219 Gastro-esophageal reflux disease without esophagitis: Secondary | ICD-10-CM

## 2016-03-29 DIAGNOSIS — M549 Dorsalgia, unspecified: Secondary | ICD-10-CM

## 2016-03-29 DIAGNOSIS — G8929 Other chronic pain: Secondary | ICD-10-CM

## 2016-03-29 DIAGNOSIS — E785 Hyperlipidemia, unspecified: Secondary | ICD-10-CM

## 2016-03-29 DIAGNOSIS — Z87891 Personal history of nicotine dependence: Secondary | ICD-10-CM

## 2016-03-29 DIAGNOSIS — Z79899 Other long term (current) drug therapy: Secondary | ICD-10-CM

## 2016-03-29 DIAGNOSIS — Z5112 Encounter for antineoplastic immunotherapy: Secondary | ICD-10-CM | POA: Diagnosis not present

## 2016-03-29 DIAGNOSIS — Z7901 Long term (current) use of anticoagulants: Secondary | ICD-10-CM

## 2016-03-29 DIAGNOSIS — C9000 Multiple myeloma not having achieved remission: Secondary | ICD-10-CM

## 2016-03-29 LAB — COMPREHENSIVE METABOLIC PANEL
ALT: 17 U/L (ref 17–63)
AST: 15 U/L (ref 15–41)
Albumin: 4 g/dL (ref 3.5–5.0)
Alkaline Phosphatase: 52 U/L (ref 38–126)
Anion gap: 10 (ref 5–15)
BUN: 19 mg/dL (ref 6–20)
CO2: 23 mmol/L (ref 22–32)
Calcium: 9.5 mg/dL (ref 8.9–10.3)
Chloride: 105 mmol/L (ref 101–111)
Creatinine, Ser: 1.44 mg/dL — ABNORMAL HIGH (ref 0.61–1.24)
GFR calc Af Amer: 55 mL/min — ABNORMAL LOW (ref >60)
GFR calc non Af Amer: 48 mL/min — ABNORMAL LOW (ref >60)
Glucose, Bld: 138 mg/dL — ABNORMAL HIGH (ref 65–99)
Potassium: 4 mmol/L (ref 3.5–5.1)
Sodium: 138 mmol/L (ref 135–145)
Total Bilirubin: 1.2 mg/dL (ref 0.3–1.2)
Total Protein: 6.7 g/dL (ref 6.5–8.1)

## 2016-03-29 LAB — CBC WITH DIFFERENTIAL/PLATELET
Basophils Absolute: 0 10*3/uL (ref 0–0.1)
Basophils Relative: 0 %
Eosinophils Absolute: 0.1 10*3/uL (ref 0–0.7)
Eosinophils Relative: 1 %
HCT: 40.4 % (ref 40.0–52.0)
Hemoglobin: 13.7 g/dL (ref 13.0–18.0)
Lymphocytes Relative: 13 %
Lymphs Abs: 1 10*3/uL (ref 1.0–3.6)
MCH: 34.1 pg — ABNORMAL HIGH (ref 26.0–34.0)
MCHC: 33.9 g/dL (ref 32.0–36.0)
MCV: 100.6 fL — ABNORMAL HIGH (ref 80.0–100.0)
Monocytes Absolute: 0.9 10*3/uL (ref 0.2–1.0)
Monocytes Relative: 11 %
Neutro Abs: 6 10*3/uL (ref 1.4–6.5)
Neutrophils Relative %: 75 %
Platelets: 200 10*3/uL (ref 150–440)
RBC: 4.02 MIL/uL — ABNORMAL LOW (ref 4.40–5.90)
RDW: 16.5 % — ABNORMAL HIGH (ref 11.5–14.5)
WBC: 8 10*3/uL (ref 3.8–10.6)

## 2016-03-29 LAB — MAGNESIUM: Magnesium: 1.7 mg/dL (ref 1.7–2.4)

## 2016-03-29 MED FILL — Medication: Qty: 1 | Status: AC

## 2016-03-29 NOTE — Progress Notes (Signed)
Pt remains with diarrhea 4-5 stools a day.  Mild fatigue.  Heart rate at 108 and weight was down 2lbs pt cut back on snacks not good for him.

## 2016-03-29 NOTE — Progress Notes (Signed)
[facility] day:  03/29/2016  Chief Complaint: Logan Perez is a 70 y.o. 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  HPI: The patient was last seen in the medical oncology clinic on 03/01/2016.  At that time, he was seen prior to week #11 daratumumab.  He has some focal discomfort in a couple of right sided ribs. He had a large area of resolving ecchymosis s/p trauma near his pain pump. M spike was 0.1 gm/dL.  Kappa free light chains were 75.7, lambda free light chains  < 1.5 with a ratio of > 50.47.  He received week #12 daratumumab on 03/16/2016  During the interim, he has felt the same.  He states that his diarrhea is the same (4-5 stools/day).  Weight is down 2 pounds after cutting back snacks.  He notes plan for cataract surgery on the right side on 05/02/2016 and left side on 05/16/2016.   Past Medical History  Diagnosis Date  . Pulmonary embolism ([place])   . Stroke ([place])   . GERD (gastroesophageal reflux disease)   . Hypertension   . Atrial fibrillation ([place])   . HLD (hyperlipidemia)   . Multiple myeloma ([place])   . Elevated PSA   . BPH (benign prostatic hyperplasia)   . Difficulty voiding   . Anxiety     Past Surgical History  Procedure Laterality Date  . Back surgery  1994  . Knee arthroscopy Left 1992  . Pain pump implantation  2012  . Stem cell implant  2008    [facility]    Family History  Problem Relation Age of Onset  . Cancer Father     throat  . Bladder Cancer Neg Hx   . Kidney disease Sister   . Prostate cancer Neg Hx   . Stroke      Social History:  reports that he has quit smoking. His smoking use included Cigarettes. He has a 30 pack-year smoking history. He does not have any smokeless tobacco history on file. He reports that he does not drink alcohol or use illicit drugs.  His wife's name is  [name].  The patient is alone today.  Allergies:  Allergies  Allergen Reactions  . Azithromycin Diarrhea    Possible cause of C. Diff Possible cause of C. Diff  . 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 Rivaroxaban Rash    Current Medications: Current Outpatient Prescriptions  Medication Sig Dispense Refill  . ALPRAZolam (XANAX) 0.25 MG tablet TAKE 1 TABLET BY MOUTH EVERY NIGHT AT BEDTIME AS NEEDED FOR ANXIETY. 30 tablet 3  . atorvastatin (LIPITOR) 10 MG tablet Take by mouth daily at 6 PM.     . baclofen (LIORESAL) 10 MG tablet Take 1 tablet (10 mg total) by mouth at bedtime. 30 each 3  . calcium citrate-vitamin D (CITRACAL+D) 315-200 MG-UNIT per tablet Take 1 tablet by mouth daily.     . cetirizine (ZYRTEC) 10 MG tablet Take 10 mg by mouth daily.     . Cholecalciferol (VITAMIN D3) 2000 UNITS capsule Take 2,000 Units by mouth daily.     . Cyanocobalamin (RA VITAMIN B-12 TR) 1000 MCG TBCR Take 1,000 mcg by mouth daily.     Lanora Manis (  DARZALEX IV) Inject into the vein every 14 (fourteen) days.    [name] dexamethasone (DECADRON) 4 MG tablet TAKE 4 TABLETS BY MOUTH ON THE DAY OF TREATMENT AND 1 TABLET BY MOUTH ON THE DAY AFTER TREATMENT 20 tablet 0  . diltiazem (CARDIZEM CD) 120 MG 24 hr capsule Reported on 03/29/2016    . ELIQUIS 5 MG TABS tablet TAKE 1 TABLET(5 MG) BY MOUTH TWICE DAILY 60 tablet 3  . fluticasone (FLONASE) 50 MCG/ACT nasal spray Place into the nose.    . Iron-Vitamins (GERITOL PO) Take by mouth.    . loperamide (IMODIUM) 2 MG capsule Take 2 mg by mouth as needed for diarrhea or loose stools. Reported on 03/01/2016    . montelukast (SINGULAIR) 10 MG tablet Take once a day as directed by MD (per treatment schedule). (Patient taking differently: Take 10 mg by mouth at bedtime. Take once a day as directed  by MD (per treatment schedule).) 30 tablet 1  . omeprazole (PRILOSEC) 20 MG capsule Take 20 mg by mouth daily.     [name] PAIN MANAGEMENT IT PUMP REFILL 1 each by Intrathecal route once. Medication: PF Fentanyl 1,500.0 mcg/ml PF Bupivicaine 30.0 mg/ml PF Clonidine 300.72mg/ml Total Volume: 40 ml Needed by 03-23-16 _0  1 each 0  . potassium chloride SA (K-DUR,KLOR-CON) 20 MEQ tablet TAKE 1 TABLET(20 MEQ) BY MOUTH TWICE DAILY 180 tablet 0  . ranitidine (ZANTAC) 150 MG tablet Take 150 mg by mouth.    . tamsulosin (FLOMAX) 0.4 MG CAPS capsule Take 1 capsule (0.4 mg total) by mouth daily. 90 capsule 3  . valACYclovir (VALTREX) 500 MG tablet TAKE 1 TABLET BY MOUTH DAILY 30 tablet 0   No current facility-administered medications for this visit.    Review of Systems:  GENERAL:  Energy level is good.  Feels "the same".  No fevers or sweats .  Weight down 2 pounds. PERFORMANCE STATUS (ECOG): 1 HEENT:  Cataracts.  No sore throat, mouth sores or tenderness. Lungs: No shortness of breath or cough.  No hemoptysis. Cardiac:  No chest pain, palpitations, orthopnea, or PND. GI:  Diarrhea (satble: 4-5 stools/day).  Appetite good.  No nausea, vomiting, diarrhea, constipation, melena or hematochezia. GU:  No urgency, frequency, dysuria, or hematuria. Musculoskeletal:  Compression fracture of back on a pain pump. No muscle tenderness. Extremities:  No pain or swelling. Skin:  Fragile skin.  No unexplained bruises.  No rashes or skin changes. Neuro:  Little tremor (chronic).  Neuropathy in feet.  No headache, weakness, balance or coordination issues. Endocrine:  No diabetes, thyroid issues, hot flashes or night sweats. Psych:  No mood changes, depression or anxiety. Pain:  Chronic back pain (pain well managed with pump). Review of systems:  All other systems reviewed and found to be negative.   Physical Exam: Blood pressure 130/83, pulse 108, temperature 97.1 F (36.2 C), temperature source Tympanic, resp.  rate 16, height _1  (1.702 m), weight 169 lb 1.5 oz (76.7 kg). GENERAL:  Well developed, well nourished, sitting comfortably in the exam room in no acute distress. MENTAL STATUS:  Alert and oriented to person, place and time. HEAD:  Short gray hair.  Goatee.  Normocephalic, atraumatic, face symmetric, no Cushingoid features. EYES:  Glasses.  Blue eyes.  Pupils equal round and reactive to light and accomodation.  No conjunctivitis or scleral icterus. ENT:  Hearing aide.  Oropharynx clear without lesion.  Hearing aide.  Tongue normal. Mucous membranes moist.  RESPIRATORY:  Clear to auscultation without rales,  wheezes or rhonchi. CARDIOVASCULAR:  Regular rate and rhythm without murmur, rub or gallop. ABDOMEN:  RUQ pain pump.  Soft, non-tender, with active bowel sounds, and no hepatosplenomegaly.  No masses. SKIN:  No ecchymosis around  pain pump.  Bone marrow site unremarkable.  No rashes, ulcers or lesions. EXTREMITIES: No edema, no skin discoloration or tenderness.  No palpable cords. LYMPH NODES: No palpable cervical, supraclavicular, axillary or inguinal adenopathy  NEUROLOGICAL: Intention tremor. PSYCH:  Appropriate.   Appointment on 03/29/2016  Component Date Value Ref Range Status  . Sodium 03/29/2016 138  135 - 145 mmol/L Final  . Potassium 03/29/2016 4.0  3.5 - 5.1 mmol/L Final  . Chloride 03/29/2016 105  101 - 111 mmol/L Final  . CO2 03/29/2016 23  22 - 32 mmol/L Final  . Glucose, Bld 03/29/2016 138* 65 - 99 mg/dL Final  . BUN 03/29/2016 19  6 - 20 mg/dL Final  . Creatinine, Ser 03/29/2016 1.44* 0.61 - 1.24 mg/dL Final  . Calcium 03/29/2016 9.5  8.9 - 10.3 mg/dL Final  . Total Protein 03/29/2016 6.7  6.5 - 8.1 g/dL Final  . Albumin 03/29/2016 4.0  3.5 - 5.0 g/dL Final  . AST 03/29/2016 15  15 - 41 U/L Final  . ALT 03/29/2016 17  17 - 63 U/L Final  . Alkaline Phosphatase 03/29/2016 52  38 - 126 U/L Final  . Total Bilirubin 03/29/2016 1.2  0.3 - 1.2 mg/dL Final  . GFR calc non  Af Amer 03/29/2016 48* >60 mL/min Final  . GFR calc Af Amer 03/29/2016 55* >60 mL/min Final   Comment: (NOTE) The eGFR has been calculated using the CKD EPI equation. This calculation has not been validated in all clinical situations. eGFR's persistently <60 mL/min signify possible Chronic Kidney Disease.   . Anion gap 03/29/2016 10  5 - 15 Final  . Magnesium 03/29/2016 1.7  1.7 - 2.4 mg/dL Final    Assessment:  Logan Perez is a 70 y.o. male with stage III mutiple myeloma.  He initially presented with progressive back pain beginning in 12/2006.  MRI revealed "spots and compression fractures".  He began Velcade, thalidomide, and Decadron.  In 08/2007, he underwent high dose chemotherapy and autologous stem cell transplant.  He recurred with a rising M-spike (2.7) with repeat M spike (1.7 gm/dl) in 03/2010.  He was initially treated with Velcade (02/08/2010 - 05/10/2010).  He then began Revlimid (15 mg 3 weeks on/1 week off) and Decadron (40 mg on day 1, 8, 15, 22).  Because of significant side effect with Decadron his dose was decreased to 10 mg once a week in 07/2010.    He was on maintenance Revlimid. Revlimid was initially10 mg 3 weeks on/1 week off. This was changed to 10 mg 2 weeks on/2 weeks off secondary to right nipple tenderness. His dose was increased to 10 mg 3 weeks on/1 week off with Decadron 10 mg a week (on Sundays) and then Revlamid 15 mg 3 weeks on and 1 week off with Decadron on Sundays.  He began Pomalyst 4 mg 3 weeks on/1 week off with Decadron on 08/27/2015.  Over the past year his SPEP has revealed no monoclonal protein (04/21/2015) and 0.5 gm/dL on 09/22/2015 and 10/20/2015. M spike was 0.1 on 02/02/2016, 03/01/2016, and 03/29/2016.  Free light chains have been monitored. Kappa free light chains were 18.54 on 11/21/2013, 18.37 on 02/20/2014, 18.93 on 05/22/2014, 32.58 (high; normal ratio 1.27) on 08/21/2014, 51.53 (high; elevated ratio of 2.12)  on 11/13/2014,  28.08 (ratio 1.73) on 12/09/2014, 23.71 (ratio 2.17) on 01/18/2015, 92.93 (ratio 9.49) on 04/21/2015, 93.44 (ratio 10.28) on 05/26/2015, 255.45 (ratio of 24.05) on 07/14/2015, 373.89 (ratio 48.31) on 08/04/2015, 474.33 (ratio 70.58) on 08/25/2015, 450.76 (ratio 55.44) on 09/22/2015, 453.4 (ratio 59.89) on 10/20/2015, 58.26 (ratio of > 40.74) on 02/02/2016, 62.67 (ratio of 37.98) on 03/01/2016, and 75.7 (ratio > 50.47) on 03/29/2016.  Bone survey on 12/08/2014 was stable.  Bone survey on 10/21/2015 revealed increase conspicuity of subcentimeter lytic lesions in the calvarium.  Bone marrow aspirate and biopsy on 11/04/2015 revealed an atypical monoclonal plasma cells estimated at 30-40% of marrow cells.   Marrow was variably cellular (approximately 45%) with background trilineage hematopoiesis. There was no significant increase in marrow reticulin fibers. Storage iron was present.    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 He develoed herpes zoster in 04/2008. He developed a pulmonary embolism in 05/2013. He was initially on Xarelto, but is now on Eliquis. He had an episode of pneumonia around this time requiring a brief admission. He developed severe lower leg cramps on 08/07/2014 secondary to hypokalemia. Duplex was negative.   He was treated for C difficile colitis (Flagyl completed 07/30/2015).  He has a chronic indwelling pain pump.  He received 4 cycles of Pomalyst and Decadron (08/27/2015 - 11/19/2015).  Restaging studies document progressive disease.  Kappa free light chains are increasing.  SPEP revealed 0.5 gm/dL monoclonal protein then 1.3 gm/dL.  Bone survey reveals increase conspicuity of subcentimeter lytic lesions in the calvarium.  Bone marrow reveals 30-40% plasma cells.   MUGA on 11/30/2015 revealed an ejection fraction of 46%.  He is felt not to be a good candidate for Kyprolis.  There were no focal wall motion  abnormalities.  He had a stress echo less than 1 year ago.  He has a history of PVCs and atrial fibrillation.  He takes Cardizem prn.  He has met with the bone marrow transplant team at [facility] with the plan for salvage chemotherapy and consideration of a second autologous stem cell transplant.  He has frozen stem cells from his first transplant.  He is s/p 12 weeks of daratumumab (Darzalex) (12/09/2015 - 03/16/2016).  He is tolerating treatment well without side effect.  Bone marrow on 02/09/2016 revealed no diagnostic morphologic evidence of plasma cell myeloma.  Marrow was normocellular to hypocellular marrow for age (ranging from 10-40%) with maturing trilineage hematopoiesis and mild multilineage dyspoiesis.  There was patchy mild increase in reticulin.  Storage iron was present.  Flow cytometry revealed no definitive evidence of monoclonality.  There was a non-specific atypical myeloid and monocytic findings with no increase in blasts.  Cytogenetics were normal (46, XY).  Symptomatically, he denies any new complaints.  M spike is 0.1 gm/dL.  Plan:  1.  Labs today:  CBC with diff, CMP, Mg, SPEP, FLCA. 2.  Week #13 daratumumab tomorrow in [place]. 3.  RTC on 04/12/2016 (Wednesday) in [place] for labs (CBC with diff, CMP, Mg). 4.  RTC on 04/13/2016 (Thursday) in [place] for week #14 daratumumab. 5.  RTC on 04/26/2016 (Wednesday) in [place] for MD assessment and labs (CBC with diff, CMP, Mg, SPEP, FLCA). 6.  RTC on 04/27/2016 (Thursday) in [place] for week #15 daratumumab.   Lequita Asal, MD  03/29/2016, 10:02 AM

## 2016-03-30 ENCOUNTER — Other Ambulatory Visit: Payer: Self-pay | Admitting: Hematology and Oncology

## 2016-03-30 ENCOUNTER — Inpatient Hospital Stay: Payer: Medicare Other

## 2016-03-30 VITALS — BP 135/86 | HR 96 | Temp 96.0°F | Resp 18

## 2016-03-30 DIAGNOSIS — Z5112 Encounter for antineoplastic immunotherapy: Secondary | ICD-10-CM | POA: Diagnosis not present

## 2016-03-30 DIAGNOSIS — C9002 Multiple myeloma in relapse: Secondary | ICD-10-CM

## 2016-03-30 LAB — PROTEIN ELECTROPHORESIS, SERUM
A/G Ratio: 1.6 (ref 0.7–1.7)
Albumin ELP: 3.6 g/dL (ref 2.9–4.4)
Alpha-1-Globulin: 0.1 g/dL (ref 0.0–0.4)
Alpha-2-Globulin: 0.9 g/dL (ref 0.4–1.0)
Beta Globulin: 1.1 g/dL (ref 0.7–1.3)
Gamma Globulin: 0.2 g/dL — ABNORMAL LOW (ref 0.4–1.8)
Globulin, Total: 2.3 g/dL (ref 2.2–3.9)
M-Spike, %: 0.1 g/dL — ABNORMAL HIGH
Total Protein ELP: 5.9 g/dL — ABNORMAL LOW (ref 6.0–8.5)

## 2016-03-30 LAB — KAPPA/LAMBDA LIGHT CHAINS
Kappa free light chain: 75.7 mg/L — ABNORMAL HIGH (ref 3.3–19.4)
Kappa, lambda light chain ratio: >50.47 — ABNORMAL HIGH (ref 0.26–1.65)
Lambda free light chains: <1.5 mg/L — ABNORMAL LOW (ref 5.7–26.3)

## 2016-03-30 MED ORDER — ACETAMINOPHEN 325 MG PO TABS
650.0000 mg | ORAL_TABLET | Freq: Once | ORAL | Status: AC
  Administered 2016-03-30: 650 mg via ORAL
  Filled 2016-03-30: qty 2

## 2016-03-30 MED ORDER — DARATUMUMAB CHEMO INJECTION 400 MG/20ML
1200.0000 mg | Freq: Once | INTRAVENOUS | Status: AC
  Administered 2016-03-30: 1200 mg via INTRAVENOUS
  Filled 2016-03-30: qty 60

## 2016-03-30 MED ORDER — SODIUM CHLORIDE 0.9 % IV SOLN
Freq: Once | INTRAVENOUS | Status: AC
  Administered 2016-03-30: 08:00:00 via INTRAVENOUS
  Filled 2016-03-30: qty 1000

## 2016-03-30 MED ORDER — SODIUM CHLORIDE 0.9 % IV SOLN
Freq: Once | INTRAVENOUS | Status: AC
  Administered 2016-03-30: 10:00:00 via INTRAVENOUS
  Filled 2016-03-30: qty 4

## 2016-03-30 MED ORDER — DIPHENHYDRAMINE HCL 25 MG PO CAPS
50.0000 mg | ORAL_CAPSULE | Freq: Once | ORAL | Status: AC
  Administered 2016-03-30: 50 mg via ORAL
  Filled 2016-03-30: qty 2

## 2016-04-06 DIAGNOSIS — Q828 Other specified congenital malformations of skin: Secondary | ICD-10-CM | POA: Insufficient documentation

## 2016-04-12 ENCOUNTER — Inpatient Hospital Stay: Payer: Medicare Other | Attending: Hematology and Oncology

## 2016-04-12 DIAGNOSIS — Z9484 Stem cells transplant status: Secondary | ICD-10-CM | POA: Diagnosis not present

## 2016-04-12 DIAGNOSIS — Z5112 Encounter for antineoplastic immunotherapy: Secondary | ICD-10-CM | POA: Insufficient documentation

## 2016-04-12 DIAGNOSIS — K219 Gastro-esophageal reflux disease without esophagitis: Secondary | ICD-10-CM | POA: Insufficient documentation

## 2016-04-12 DIAGNOSIS — Z86711 Personal history of pulmonary embolism: Secondary | ICD-10-CM | POA: Insufficient documentation

## 2016-04-12 DIAGNOSIS — C9002 Multiple myeloma in relapse: Secondary | ICD-10-CM | POA: Insufficient documentation

## 2016-04-12 DIAGNOSIS — Z7901 Long term (current) use of anticoagulants: Secondary | ICD-10-CM | POA: Insufficient documentation

## 2016-04-12 DIAGNOSIS — Z8673 Personal history of transient ischemic attack (TIA), and cerebral infarction without residual deficits: Secondary | ICD-10-CM | POA: Diagnosis not present

## 2016-04-12 DIAGNOSIS — Z79899 Other long term (current) drug therapy: Secondary | ICD-10-CM | POA: Diagnosis not present

## 2016-04-12 DIAGNOSIS — R5383 Other fatigue: Secondary | ICD-10-CM | POA: Diagnosis not present

## 2016-04-12 DIAGNOSIS — F419 Anxiety disorder, unspecified: Secondary | ICD-10-CM | POA: Diagnosis not present

## 2016-04-12 DIAGNOSIS — I10 Essential (primary) hypertension: Secondary | ICD-10-CM | POA: Diagnosis not present

## 2016-04-12 DIAGNOSIS — G629 Polyneuropathy, unspecified: Secondary | ICD-10-CM | POA: Insufficient documentation

## 2016-04-12 DIAGNOSIS — E782 Mixed hyperlipidemia: Secondary | ICD-10-CM | POA: Insufficient documentation

## 2016-04-12 DIAGNOSIS — Z87891 Personal history of nicotine dependence: Secondary | ICD-10-CM | POA: Insufficient documentation

## 2016-04-12 DIAGNOSIS — C9000 Multiple myeloma not having achieved remission: Secondary | ICD-10-CM

## 2016-04-12 DIAGNOSIS — I4891 Unspecified atrial fibrillation: Secondary | ICD-10-CM | POA: Insufficient documentation

## 2016-04-12 DIAGNOSIS — N400 Enlarged prostate without lower urinary tract symptoms: Secondary | ICD-10-CM | POA: Insufficient documentation

## 2016-04-12 LAB — CBC WITH DIFFERENTIAL/PLATELET
Basophils Absolute: 0 10*3/uL (ref 0–0.1)
Basophils Relative: 1 %
Eosinophils Absolute: 0.1 10*3/uL (ref 0–0.7)
Eosinophils Relative: 2 %
HCT: 36.6 % — ABNORMAL LOW (ref 40.0–52.0)
Hemoglobin: 12.5 g/dL — ABNORMAL LOW (ref 13.0–18.0)
Lymphocytes Relative: 8 %
Lymphs Abs: 0.5 10*3/uL — ABNORMAL LOW (ref 1.0–3.6)
MCH: 34.5 pg — ABNORMAL HIGH (ref 26.0–34.0)
MCHC: 34.2 g/dL (ref 32.0–36.0)
MCV: 101 fL — ABNORMAL HIGH (ref 80.0–100.0)
Monocytes Absolute: 0.8 10*3/uL (ref 0.2–1.0)
Monocytes Relative: 13 %
Neutro Abs: 4.9 10*3/uL (ref 1.4–6.5)
Neutrophils Relative %: 76 %
Platelets: 158 10*3/uL (ref 150–440)
RBC: 3.63 MIL/uL — ABNORMAL LOW (ref 4.40–5.90)
RDW: 15.7 % — ABNORMAL HIGH (ref 11.5–14.5)
WBC: 6.4 10*3/uL (ref 3.8–10.6)

## 2016-04-12 LAB — COMPREHENSIVE METABOLIC PANEL
ALT: 14 U/L — ABNORMAL LOW (ref 17–63)
AST: 15 U/L (ref 15–41)
Albumin: 3.9 g/dL (ref 3.5–5.0)
Alkaline Phosphatase: 46 U/L (ref 38–126)
Anion gap: 5 (ref 5–15)
BUN: 13 mg/dL (ref 6–20)
CO2: 28 mmol/L (ref 22–32)
Calcium: 9.3 mg/dL (ref 8.9–10.3)
Chloride: 103 mmol/L (ref 101–111)
Creatinine, Ser: 1.17 mg/dL (ref 0.61–1.24)
GFR calc Af Amer: >60 mL/min (ref >60)
GFR calc non Af Amer: >60 mL/min (ref >60)
Glucose, Bld: 110 mg/dL — ABNORMAL HIGH (ref 65–99)
Potassium: 4.1 mmol/L (ref 3.5–5.1)
Sodium: 136 mmol/L (ref 135–145)
Total Bilirubin: 0.9 mg/dL (ref 0.3–1.2)
Total Protein: 6.1 g/dL — ABNORMAL LOW (ref 6.5–8.1)

## 2016-04-12 LAB — MAGNESIUM: Magnesium: 1.7 mg/dL (ref 1.7–2.4)

## 2016-04-13 ENCOUNTER — Other Ambulatory Visit: Payer: Self-pay | Admitting: Hematology and Oncology

## 2016-04-13 ENCOUNTER — Inpatient Hospital Stay: Payer: Medicare Other

## 2016-04-13 VITALS — BP 126/79 | HR 89 | Temp 96.5°F | Resp 16

## 2016-04-13 DIAGNOSIS — C9002 Multiple myeloma in relapse: Secondary | ICD-10-CM

## 2016-04-13 DIAGNOSIS — Z5112 Encounter for antineoplastic immunotherapy: Secondary | ICD-10-CM | POA: Diagnosis not present

## 2016-04-13 MED ORDER — DIPHENHYDRAMINE HCL 25 MG PO CAPS
50.0000 mg | ORAL_CAPSULE | Freq: Once | ORAL | Status: AC
  Administered 2016-04-13: 50 mg via ORAL
  Filled 2016-04-13: qty 2

## 2016-04-13 MED ORDER — SODIUM CHLORIDE 0.9 % IV SOLN
1200.0000 mg | Freq: Once | INTRAVENOUS | Status: AC
  Administered 2016-04-13: 1200 mg via INTRAVENOUS
  Filled 2016-04-13: qty 60

## 2016-04-13 MED ORDER — SODIUM CHLORIDE 0.9 % IV SOLN
Freq: Once | INTRAVENOUS | Status: AC
  Administered 2016-04-13: 09:00:00 via INTRAVENOUS
  Filled 2016-04-13: qty 1000

## 2016-04-13 MED ORDER — SODIUM CHLORIDE 0.9 % IV SOLN
Freq: Once | INTRAVENOUS | Status: AC
  Administered 2016-04-13: 10:00:00 via INTRAVENOUS
  Filled 2016-04-13: qty 4

## 2016-04-13 MED ORDER — ACETAMINOPHEN 325 MG PO TABS
650.0000 mg | ORAL_TABLET | Freq: Once | ORAL | Status: AC
  Administered 2016-04-13: 650 mg via ORAL
  Filled 2016-04-13: qty 2

## 2016-04-22 ENCOUNTER — Other Ambulatory Visit: Payer: Self-pay | Admitting: Hematology and Oncology

## 2016-04-26 ENCOUNTER — Inpatient Hospital Stay (HOSPITAL_BASED_OUTPATIENT_CLINIC_OR_DEPARTMENT_OTHER): Payer: Medicare Other | Admitting: Hematology and Oncology

## 2016-04-26 ENCOUNTER — Encounter: Payer: Self-pay | Admitting: Hematology and Oncology

## 2016-04-26 ENCOUNTER — Inpatient Hospital Stay: Payer: Medicare Other

## 2016-04-26 VITALS — BP 122/81 | HR 93 | Temp 97.3°F | Resp 18 | Wt 171.8 lb

## 2016-04-26 DIAGNOSIS — C9002 Multiple myeloma in relapse: Secondary | ICD-10-CM | POA: Diagnosis not present

## 2016-04-26 DIAGNOSIS — I4891 Unspecified atrial fibrillation: Secondary | ICD-10-CM

## 2016-04-26 DIAGNOSIS — K219 Gastro-esophageal reflux disease without esophagitis: Secondary | ICD-10-CM

## 2016-04-26 DIAGNOSIS — Z7901 Long term (current) use of anticoagulants: Secondary | ICD-10-CM

## 2016-04-26 DIAGNOSIS — N400 Enlarged prostate without lower urinary tract symptoms: Secondary | ICD-10-CM

## 2016-04-26 DIAGNOSIS — F419 Anxiety disorder, unspecified: Secondary | ICD-10-CM

## 2016-04-26 DIAGNOSIS — R5383 Other fatigue: Secondary | ICD-10-CM

## 2016-04-26 DIAGNOSIS — Z9484 Stem cells transplant status: Secondary | ICD-10-CM | POA: Diagnosis not present

## 2016-04-26 DIAGNOSIS — E782 Mixed hyperlipidemia: Secondary | ICD-10-CM | POA: Diagnosis not present

## 2016-04-26 DIAGNOSIS — Z5112 Encounter for antineoplastic immunotherapy: Secondary | ICD-10-CM | POA: Diagnosis not present

## 2016-04-26 DIAGNOSIS — I10 Essential (primary) hypertension: Secondary | ICD-10-CM

## 2016-04-26 DIAGNOSIS — C9000 Multiple myeloma not having achieved remission: Secondary | ICD-10-CM

## 2016-04-26 DIAGNOSIS — Z79899 Other long term (current) drug therapy: Secondary | ICD-10-CM

## 2016-04-26 DIAGNOSIS — Z87891 Personal history of nicotine dependence: Secondary | ICD-10-CM

## 2016-04-26 DIAGNOSIS — G629 Polyneuropathy, unspecified: Secondary | ICD-10-CM

## 2016-04-26 LAB — CBC WITH DIFFERENTIAL/PLATELET
Basophils Absolute: 0 10*3/uL (ref 0–0.1)
Basophils Relative: 0 %
Eosinophils Absolute: 0.1 10*3/uL (ref 0–0.7)
Eosinophils Relative: 1 %
HCT: 37.5 % — ABNORMAL LOW (ref 40.0–52.0)
Hemoglobin: 12.8 g/dL — ABNORMAL LOW (ref 13.0–18.0)
Lymphocytes Relative: 6 %
Lymphs Abs: 0.5 10*3/uL — ABNORMAL LOW (ref 1.0–3.6)
MCH: 34.7 pg — ABNORMAL HIGH (ref 26.0–34.0)
MCHC: 34 g/dL (ref 32.0–36.0)
MCV: 101.9 fL — ABNORMAL HIGH (ref 80.0–100.0)
Monocytes Absolute: 0.9 10*3/uL (ref 0.2–1.0)
Monocytes Relative: 9 %
Neutro Abs: 8 10*3/uL — ABNORMAL HIGH (ref 1.4–6.5)
Neutrophils Relative %: 84 %
Platelets: 172 10*3/uL (ref 150–440)
RBC: 3.68 MIL/uL — ABNORMAL LOW (ref 4.40–5.90)
RDW: 15.3 % — ABNORMAL HIGH (ref 11.5–14.5)
WBC: 9.5 10*3/uL (ref 3.8–10.6)

## 2016-04-26 LAB — COMPREHENSIVE METABOLIC PANEL
ALT: 16 U/L — ABNORMAL LOW (ref 17–63)
AST: 17 U/L (ref 15–41)
Albumin: 3.8 g/dL (ref 3.5–5.0)
Alkaline Phosphatase: 46 U/L (ref 38–126)
Anion gap: 8 (ref 5–15)
BUN: 16 mg/dL (ref 6–20)
CO2: 25 mmol/L (ref 22–32)
Calcium: 9.1 mg/dL (ref 8.9–10.3)
Chloride: 103 mmol/L (ref 101–111)
Creatinine, Ser: 1.32 mg/dL — ABNORMAL HIGH (ref 0.61–1.24)
GFR calc Af Amer: >60 mL/min (ref >60)
GFR calc non Af Amer: 53 mL/min — ABNORMAL LOW (ref >60)
Glucose, Bld: 132 mg/dL — ABNORMAL HIGH (ref 65–99)
Potassium: 3.8 mmol/L (ref 3.5–5.1)
Sodium: 136 mmol/L (ref 135–145)
Total Bilirubin: 0.8 mg/dL (ref 0.3–1.2)
Total Protein: 6.3 g/dL — ABNORMAL LOW (ref 6.5–8.1)

## 2016-04-26 LAB — MAGNESIUM: Magnesium: 1.7 mg/dL (ref 1.7–2.4)

## 2016-04-26 NOTE — Progress Notes (Signed)
Patient is here for follow up, he has some questions about triglycerides. He sees his pcp but was asking if you could help clarify this. coud any of his meds or treatments cause this?

## 2016-04-26 NOTE — Progress Notes (Signed)
[facility] day:  04/26/2016  Chief Complaint: Logan Perez is a 70 y.o. 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  HPI: The patient was last seen in the medical oncology clinic on 03/29/2016.  At that time, he was seen prior to week #13 daratumumab.  He denied any new concerns.  Exam was stable.  M spike was 0.1 gm/dL.  He received week # 14 daratumumab on 04/13/2016.  During the interim, he has a little more tired than usual.  He may take a nap during the day.  He comments that his triglycerides are up (270 on 04/06/2016 compared to 111 on 10/05/2015).  He denies any bone pain, fevers or infections.  He has plans for cataract surgery on the right side on 05/02/2016 and left side on 05/16/2016.   Past Medical History  Diagnosis Date  . Pulmonary embolism ([place])   . Stroke ([place])   . GERD (gastroesophageal reflux disease)   . Hypertension   . Atrial fibrillation ([place])   . HLD (hyperlipidemia)   . Multiple myeloma ([place])   . Elevated PSA   . BPH (benign prostatic hyperplasia)   . Difficulty voiding   . Anxiety   . Pneumonia   . Neuropathy ([place])   . HOH (hard of hearing)     Past Surgical History  Procedure Laterality Date  . Back surgery  1994  . Knee arthroscopy Left 1992  . Pain pump implantation  2012  . Stem cell implant  2008    [facility]    Family History  Problem Relation Age of Onset  . Cancer Father     throat  . Bladder Cancer Neg Hx   . Kidney disease Sister   . Prostate cancer Neg Hx   . Stroke      Social History:  reports that he has quit smoking. His smoking use included Cigarettes. He has a 30 pack-year smoking history. He does not have any smokeless tobacco history on file. He reports that he does not drink alcohol or use illicit drugs.  His wife's name is [name].  The patient is alone  today.  Allergies:  Allergies  Allergen Reactions  . Azithromycin Diarrhea    Possible cause of C. Diff Possible cause of C. Diff  . 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 Rivaroxaban Rash    Current Medications: Current Outpatient Prescriptions  Medication Sig Dispense Refill  . ALPRAZolam (XANAX) 0.25 MG tablet TAKE 1 TABLET BY MOUTH EVERY NIGHT AT BEDTIME AS NEEDED FOR ANXIETY. 30 tablet 3  . atorvastatin (LIPITOR) 10 MG tablet Take by mouth daily at 6 PM.     . baclofen (LIORESAL) 10 MG tablet Take 1 tablet (10 mg total) by mouth at bedtime. 30 each 3  . calcium citrate-vitamin D (CITRACAL+D) 315-200 MG-UNIT per tablet Take 1 tablet by mouth daily.     . cetirizine (ZYRTEC) 10 MG tablet Take 10 mg by mouth daily.     . Cholecalciferol (VITAMIN D3) 2000 UNITS capsule Take 2,000 Units by mouth daily.     . Cyanocobalamin (RA VITAMIN B-12 TR) 1000 MCG TBCR Take 1,000 mcg by mouth daily.     . Daratumumab (DARZALEX IV) Inject  into the vein every 14 (fourteen) days.    [name] dexamethasone (DECADRON) 4 MG tablet TAKE 4 TABLETS BY MOUTH ON THE DAY OF TREATMENT AND 1 TABLET BY MOUTH ON THE DAY AFTER TREATMENT 20 tablet 0  . diltiazem (CARDIZEM CD) 120 MG 24 hr capsule Reported on 03/29/2016    . ELIQUIS 5 MG TABS tablet TAKE 1 TABLET(5 MG) BY MOUTH TWICE DAILY 60 tablet 3  . fluticasone (FLONASE) 50 MCG/ACT nasal spray Place into the nose.    . Iron-Vitamins (GERITOL PO) Take by mouth.    . montelukast (SINGULAIR) 10 MG tablet TAKE 1 TABLET BY MOUTH ONCE A DAY AS DIRECTED BY MD 30 tablet 0  . omeprazole (PRILOSEC) 20 MG capsule Take 20 mg by mouth daily.     [name] PAIN MANAGEMENT IT PUMP REFILL 1 each by Intrathecal route once. Medication: PF Fentanyl 1,500.0 mcg/ml PF Bupivicaine 30.0 mg/ml PF Clonidine  300.76mg/ml Total Volume: 40 ml Needed by 03-23-16 _0  1 each 0  . potassium chloride SA (K-DUR,KLOR-CON) 20 MEQ tablet TAKE 1 TABLET(20 MEQ) BY MOUTH TWICE DAILY 180 tablet 0  . ranitidine (ZANTAC) 150 MG tablet Take 150 mg by mouth.    . tamsulosin (FLOMAX) 0.4 MG CAPS capsule Take 1 capsule (0.4 mg total) by mouth daily. 90 capsule 3  . valACYclovir (VALTREX) 500 MG tablet TAKE 1 TABLET BY MOUTH DAILY 30 tablet 0  . loperamide (IMODIUM) 2 MG capsule Take 2 mg by mouth as needed for diarrhea or loose stools. Reported on 04/26/2016     No current facility-administered medications for this visit.    Review of Systems:  GENERAL:  Little more fatigued.  No fevers or sweats .  Weight up 2 pounds. PERFORMANCE STATUS (ECOG): 1 HEENT:  Cataracts (see HPI).  No sore throat, mouth sores or tenderness. Lungs: No shortness of breath or cough.  No hemoptysis. Cardiac:  No chest pain, palpitations, orthopnea, or PND. GI:  Diarrhea (satble: 4-5 stools/day).  Appetite good.  No nausea, vomiting, diarrhea, constipation, melena or hematochezia. GU:  No urgency, frequency, dysuria, or hematuria. Musculoskeletal:  Compression fracture of back on a pain pump. No muscle tenderness. Extremities:  No pain or swelling. Skin:  Fragile skin.  No unexplained bruises.  No rashes or skin changes. Neuro:  Little tremor (chronic).  Neuropathy in feet.  No headache, weakness, balance or coordination issues. Endocrine:  No diabetes, thyroid issues, hot flashes or night sweats. Psych:  No mood changes, depression or anxiety. Pain:  Chronic back pain (pain well managed with pump). Review of systems:  All other systems reviewed and found to be negative.   Physical Exam: Blood pressure 122/81, pulse 93, temperature 97.3 F (36.3 C), temperature source Tympanic, resp. rate 18, weight 171 lb 13.6 oz (77.95 kg). GENERAL:  Well developed, well nourished, sitting comfortably in the exam room in no acute distress. MENTAL  STATUS:  Alert and oriented to person, place and time. HEAD:  Short gray hair.  Goatee.  Normocephalic, atraumatic, face symmetric, no Cushingoid features. EYES:  Glasses.  Blue eyes.  Pupils equal round and reactive to light and accomodation.  No conjunctivitis or scleral icterus. ENT:  Hearing aide.  Oropharynx clear without lesion.  Hearing aide.  Tongue normal. Mucous membranes moist.  RESPIRATORY:  Clear to auscultation without rales, wheezes or rhonchi. CARDIOVASCULAR:  Regular rate and rhythm without murmur, rub or gallop. ABDOMEN:  RUQ pain pump.  Soft, non-tender, with active bowel sounds, and no hepatosplenomegaly.  No  masses. SKIN:  No rashes, ulcers or lesions. EXTREMITIES: No edema, no skin discoloration or tenderness.  No palpable cords. LYMPH NODES: No palpable cervical, supraclavicular, axillary or inguinal adenopathy  NEUROLOGICAL: Intention tremor. PSYCH:  Appropriate.   Appointment on 04/26/2016  Component Date Value Ref Range Status  . WBC 04/26/2016 9.5  3.8 - 10.6 K/uL Final  . RBC 04/26/2016 3.68* 4.40 - 5.90 MIL/uL Final  . Hemoglobin 04/26/2016 12.8* 13.0 - 18.0 g/dL Final  . HCT 04/26/2016 37.5* 40.0 - 52.0 % Final  . MCV 04/26/2016 101.9* 80.0 - 100.0 fL Final  . MCH 04/26/2016 34.7* 26.0 - 34.0 pg Final  . MCHC 04/26/2016 34.0  32.0 - 36.0 g/dL Final  . RDW 04/26/2016 15.3* 11.5 - 14.5 % Final  . Platelets 04/26/2016 172  150 - 440 K/uL Final  . Neutrophils Relative % 04/26/2016 84   Final  . Neutro Abs 04/26/2016 8.0* 1.4 - 6.5 K/uL Final  . Lymphocytes Relative 04/26/2016 6   Final  . Lymphs Abs 04/26/2016 0.5* 1.0 - 3.6 K/uL Final  . Monocytes Relative 04/26/2016 9   Final  . Monocytes Absolute 04/26/2016 0.9  0.2 - 1.0 K/uL Final  . Eosinophils Relative 04/26/2016 1   Final  . Eosinophils Absolute 04/26/2016 0.1  0 - 0.7 K/uL Final  . Basophils Relative 04/26/2016 0   Final  . Basophils Absolute 04/26/2016 0.0  0 - 0.1 K/uL Final  . Sodium 04/26/2016  136  135 - 145 mmol/L Final  . Potassium 04/26/2016 3.8  3.5 - 5.1 mmol/L Final  . Chloride 04/26/2016 103  101 - 111 mmol/L Final  . CO2 04/26/2016 25  22 - 32 mmol/L Final  . Glucose, Bld 04/26/2016 132* 65 - 99 mg/dL Final  . BUN 04/26/2016 16  6 - 20 mg/dL Final  . Creatinine, Ser 04/26/2016 1.32* 0.61 - 1.24 mg/dL Final  . Calcium 04/26/2016 9.1  8.9 - 10.3 mg/dL Final  . Total Protein 04/26/2016 6.3* 6.5 - 8.1 g/dL Final  . Albumin 04/26/2016 3.8  3.5 - 5.0 g/dL Final  . AST 04/26/2016 17  15 - 41 U/L Final  . ALT 04/26/2016 16* 17 - 63 U/L Final  . Alkaline Phosphatase 04/26/2016 46  38 - 126 U/L Final  . Total Bilirubin 04/26/2016 0.8  0.3 - 1.2 mg/dL Final  . GFR calc non Af Amer 04/26/2016 53* >60 mL/min Final  . GFR calc Af Amer 04/26/2016 >60  >60 mL/min Final   Comment: (NOTE) The eGFR has been calculated using the CKD EPI equation. This calculation has not been validated in all clinical situations. eGFR's persistently <60 mL/min signify possible Chronic Kidney Disease.   . Anion gap 04/26/2016 8  5 - 15 Final  . Magnesium 04/26/2016 1.7  1.7 - 2.4 mg/dL Final    Assessment:  Logan Perez is a 70 y.o. male with stage III mutiple myeloma.  He initially presented with progressive back pain beginning in 12/2006.  MRI revealed "spots and compression fractures".  He began Velcade, thalidomide, and Decadron.  In 08/2007, he underwent high dose chemotherapy and autologous stem cell transplant.  He recurred with a rising M-spike (2.7) with repeat M spike (1.7 gm/dl) in 03/2010.  He was initially treated with Velcade (02/08/2010 - 05/10/2010).  He then began Revlimid (15 mg 3 weeks on/1 week off) and Decadron (40 mg on day 1, 8, 15, 22).  Because of significant side effect with Decadron his dose was decreased to 10  mg once a week in 07/2010.    He was on maintenance Revlimid. Revlimid was initially10 mg 3 weeks on/1 week off. This was changed to 10 mg 2 weeks on/2 weeks  off secondary to right nipple tenderness. His dose was increased to 10 mg 3 weeks on/1 week off with Decadron 10 mg a week (on Sundays) and then Revlamid 15 mg 3 weeks on and 1 week off with Decadron on Sundays.  He began Pomalyst 4 mg 3 weeks on/1 week off with Decadron on 08/27/2015.  Over the past year his SPEP has revealed no monoclonal protein (04/21/2015) and 0.5 gm/dL on 09/22/2015 and 10/20/2015. M spike was 0.1 on 02/02/2016, 03/01/2016, 03/29/2016, and 04/26/2016.  Free light chains have been monitored. Kappa free light chains were 18.54 on 11/21/2013, 18.37 on 02/20/2014, 18.93 on 05/22/2014, 32.58 (high; normal ratio 1.27) on 08/21/2014, 51.53 (high; elevated ratio of 2.12) on 11/13/2014, 28.08 (ratio 1.73) on 12/09/2014, 23.71 (ratio 2.17) on 01/18/2015, 92.93 (ratio 9.49) on 04/21/2015, 93.44 (ratio 10.28) on 05/26/2015, 255.45 (ratio of 24.05) on 07/14/2015, 373.89 (ratio 48.31) on 08/04/2015, 474.33 (ratio 70.58) on 08/25/2015, 450.76 (ratio 55.44) on 09/22/2015, 453.4 (ratio 59.89) on 10/20/2015, 58.26 (ratio of > 40.74) on 02/02/2016, 62.67 (ratio of 37.98) on 03/01/2016, 75.7 (ratio > 50.47) on 03/29/2016, and 79.7 (ratio 53.13) on 04/26/2016.  Bone survey on 12/08/2014 was stable.  Bone survey on 10/21/2015 revealed increase conspicuity of subcentimeter lytic lesions in the calvarium.  Bone marrow aspirate and biopsy on 11/04/2015 revealed an atypical monoclonal plasma cells estimated at 30-40% of marrow cells.   Marrow was variably cellular (approximately 45%) with background trilineage hematopoiesis. There was no significant increase in marrow reticulin fibers. Storage iron was present.    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 He develoed herpes zoster in 04/2008. He developed a pulmonary embolism in 05/2013. He was initially on Xarelto, but is now on Eliquis. He had an episode of pneumonia around this time requiring a  brief admission. He developed severe lower leg cramps on 08/07/2014 secondary to hypokalemia. Duplex was negative.   He was treated for C difficile colitis (Flagyl completed 07/30/2015).  He has a chronic indwelling pain pump.  He received 4 cycles of Pomalyst and Decadron (08/27/2015 - 11/19/2015).  Restaging studies document progressive disease.  Kappa free light chains are increasing.  SPEP revealed 0.5 gm/dL monoclonal protein then 1.3 gm/dL.  Bone survey reveals increase conspicuity of subcentimeter lytic lesions in the calvarium.  Bone marrow reveals 30-40% plasma cells.   MUGA on 11/30/2015 revealed an ejection fraction of 46%.  He is felt not to be a good candidate for Kyprolis.  There were no focal wall motion abnormalities.  He had a stress echo less than 1 year ago.  He has a history of PVCs and atrial fibrillation.  He takes Cardizem prn.  He has met with the bone marrow transplant team at [facility] with the plan for salvage chemotherapy and consideration of a second autologous stem cell transplant.  He has frozen stem cells from his first transplant.  He is s/p 14 weeks of daratumumab (Darzalex) (12/09/2015 - 04/13/2016).  He is tolerating treatment well without side effect.  Bone marrow on 02/09/2016 revealed no diagnostic morphologic evidence of plasma cell myeloma.  Marrow was normocellular to hypocellular marrow for age (ranging from 10-40%) with maturing trilineage hematopoiesis and mild multilineage dyspoiesis.  There was patchy mild increase in reticulin.  Storage iron was  present.  Flow cytometry revealed no definitive evidence of monoclonality.  There was a non-specific atypical myeloid and monocytic findings with no increase in blasts.  Cytogenetics were normal (46, XY).  Symptomatically, he is a little more fatigued.  He will be having cataract surgery in the next week.  M spike is 0.1 gm/dL.  Plan:  1.  Labs today:  CBC with diff, CMP, Mg, SPEP, FLCA. 2.  Week #15 daratumumab  tomorrow in [address]. 3.  Phone follow-up with Dr. Gilford Raid. 4.  Discuss triglycerides.  No known issue with dartumumab per my understanding. 5.  RTC on 05/10/2016 (Wednesday) in [place] for labs (CBC with diff, CMP, Mg). 6.  RTC on 05/11/2016 (Thursday) in [place] for week #16 daratumumab. 7.  RTC on 05/24/2016 (Wednesday) in [place] for MD assessment and labs (CBC with diff, CMP, Mg, SPEP, FLCA). 8.  RTC on 05/25/2016 (Thursday) in [place] for week #17 daratumumab.   Lequita Asal, MD  04/26/2016, 10:13 AM

## 2016-04-27 ENCOUNTER — Ambulatory Visit: Payer: Self-pay | Admitting: Urology

## 2016-04-27 ENCOUNTER — Inpatient Hospital Stay: Payer: Medicare Other

## 2016-04-27 ENCOUNTER — Other Ambulatory Visit: Payer: Self-pay | Admitting: *Deleted

## 2016-04-27 DIAGNOSIS — C9000 Multiple myeloma not having achieved remission: Secondary | ICD-10-CM

## 2016-04-27 DIAGNOSIS — Z5112 Encounter for antineoplastic immunotherapy: Secondary | ICD-10-CM | POA: Diagnosis not present

## 2016-04-27 DIAGNOSIS — C9002 Multiple myeloma in relapse: Secondary | ICD-10-CM

## 2016-04-27 LAB — PROTEIN ELECTROPHORESIS, SERUM
A/G Ratio: 1.8 — ABNORMAL HIGH (ref 0.7–1.7)
Albumin ELP: 3.5 g/dL (ref 2.9–4.4)
Alpha-1-Globulin: 0.2 g/dL (ref 0.0–0.4)
Alpha-2-Globulin: 0.7 g/dL (ref 0.4–1.0)
Beta Globulin: 1 g/dL (ref 0.7–1.3)
Gamma Globulin: 0.1 g/dL — ABNORMAL LOW (ref 0.4–1.8)
Globulin, Total: 2 g/dL — ABNORMAL LOW (ref 2.2–3.9)
M-Spike, %: 0.1 g/dL — ABNORMAL HIGH
Total Protein ELP: 5.5 g/dL — ABNORMAL LOW (ref 6.0–8.5)

## 2016-04-27 LAB — KAPPA/LAMBDA LIGHT CHAINS
Kappa free light chain: 79.7 mg/L — ABNORMAL HIGH (ref 3.3–19.4)
Kappa, lambda light chain ratio: 53.13 — ABNORMAL HIGH (ref 0.26–1.65)
Lambda free light chains: 1.5 mg/L — ABNORMAL LOW (ref 5.7–26.3)

## 2016-04-27 MED ORDER — DIPHENHYDRAMINE HCL 25 MG PO CAPS
50.0000 mg | ORAL_CAPSULE | Freq: Once | ORAL | Status: AC
  Administered 2016-04-27: 50 mg via ORAL
  Filled 2016-04-27: qty 2

## 2016-04-27 MED ORDER — SODIUM CHLORIDE 0.9 % IV SOLN
1200.0000 mg | Freq: Once | INTRAVENOUS | Status: AC
  Administered 2016-04-27: 1200 mg via INTRAVENOUS
  Filled 2016-04-27: qty 60

## 2016-04-27 MED ORDER — SODIUM CHLORIDE 0.9 % IV SOLN
Freq: Once | INTRAVENOUS | Status: AC
  Administered 2016-04-27: 09:00:00 via INTRAVENOUS
  Filled 2016-04-27: qty 4

## 2016-04-27 MED ORDER — SODIUM CHLORIDE 0.9 % IV SOLN
Freq: Once | INTRAVENOUS | Status: AC
Start: 2016-04-27 — End: 2016-04-27
  Administered 2016-04-27: 09:00:00 via INTRAVENOUS
  Filled 2016-04-27: qty 1000

## 2016-04-27 MED ORDER — ACETAMINOPHEN 325 MG PO TABS
650.0000 mg | ORAL_TABLET | Freq: Once | ORAL | Status: AC
  Administered 2016-04-27: 650 mg via ORAL
  Filled 2016-04-27: qty 2

## 2016-05-01 ENCOUNTER — Encounter: Payer: Self-pay | Admitting: Urology

## 2016-05-01 ENCOUNTER — Ambulatory Visit (INDEPENDENT_AMBULATORY_CARE_PROVIDER_SITE_OTHER): Payer: Medicare Other | Admitting: Urology

## 2016-05-01 VITALS — BP 121/81 | HR 81 | Ht 67.0 in | Wt 171.5 lb

## 2016-05-01 DIAGNOSIS — N401 Enlarged prostate with lower urinary tract symptoms: Secondary | ICD-10-CM | POA: Diagnosis not present

## 2016-05-01 DIAGNOSIS — N138 Other obstructive and reflux uropathy: Secondary | ICD-10-CM

## 2016-05-01 MED ORDER — TAMSULOSIN HCL 0.4 MG PO CAPS
0.4000 mg | ORAL_CAPSULE | Freq: Every day | ORAL | 4 refills | Status: DC
Start: 2016-05-01 — End: 2017-05-01

## 2016-05-01 NOTE — Progress Notes (Signed)
05/01/2016 9:04 AM   Logan Perez 1946/08/29 [id number]  Referring provider: Sherrin Daisy, MD [place], [place] [address]  Chief Complaint  Patient presents with  . Benign Prostatic Hypertrophy    1 year follow up    HPI: Patient is a 70 year old Caucasian male who presents today for a one year follow up for BPH with LUTS.    BPH WITH LUTS His IPSS score today is 5, which is mild lower urinary tract symptomatology. He is mixed with his quality life due to his urinary symptoms.   His major complaint today nocturia x 4 and frequency .  He attributes this to the increase of water intake (36 oz daily) due to his infusions for multiple myeloma.  He has had these symptoms for few months years.  He denies any dysuria, hematuria or suprapubic pain.  He currently taking tamsulosin 0.4 mg daily.  He would like to discontinue the medication.   He also denies any recent fevers, chills, nausea or vomiting.        IPSS    Row Name 05/01/16 0800         International Prostate Symptom Score   How often have you had the sensation of not emptying your bladder? Less than 1 in 5     How often have you had to urinate less than every two hours? Less than half the time     How often have you found you stopped and started again several times when you urinated? Not at All     How often have you found it difficult to postpone urination? Less than 1 in 5 times     How often have you had a weak urinary stream? Not at All     How often have you had to strain to start urination? Not at All     How many times did you typically get up at night to urinate? 1 Time     Total IPSS Score 5       Quality of Life due to urinary symptoms   If you were to spend the rest of your life with your urinary condition just the way it is now how would you feel about that? Mixed        Score:  1-7 Mild 8-19 Moderate 20-35 Severe    PMH: Past Medical History:  Diagnosis Date  . Anxiety    . Atrial fibrillation ([place])   . BPH (benign prostatic hyperplasia)   . Difficulty voiding   . Elevated PSA   . GERD (gastroesophageal reflux disease)   . HLD (hyperlipidemia)   . HOH (hard of hearing)   . Hypertension   . Multiple myeloma ([place])   . Neuropathy ([place])   . Pneumonia   . Pulmonary embolism ([place])   . Stroke [facility])     Surgical History: Past Surgical History:  Procedure Laterality Date  . BACK SURGERY  1994  . KNEE ARTHROSCOPY Left 1992  . PAIN PUMP IMPLANTATION  2012  . stem cell implant  2008   [facility]    Home Medications:    Medication List       Accurate as of 05/01/16  9:04 AM. Always use your most recent med list.          acetaminophen 500 MG chewable tablet Commonly known as:  TYLENOL Chew 500 mg by mouth every 6 (six) hours as needed for pain.   ALPRAZolam 0.25 MG tablet  Commonly known as:  XANAX TAKE 1 TABLET BY MOUTH EVERY NIGHT AT BEDTIME AS NEEDED FOR ANXIETY.   atorvastatin 10 MG tablet Commonly known as:  LIPITOR Take by mouth daily at 6 PM.   baclofen 10 MG tablet Commonly known as:  LIORESAL Take 1 tablet (10 mg total) by mouth at bedtime.   calcium citrate-vitamin D 315-200 MG-UNIT tablet Commonly known as:  CITRACAL+D Take 1 tablet by mouth daily.   cetirizine 10 MG tablet Commonly known as:  ZYRTEC Take 10 mg by mouth daily.   DARZALEX IV Inject into the vein every 14 (fourteen) days.   dexamethasone 4 MG tablet Commonly known as:  DECADRON TAKE 4 TABLETS BY MOUTH ON THE DAY OF TREATMENT AND 1 TABLET BY MOUTH ON THE DAY AFTER TREATMENT   diltiazem 120 MG 24 hr capsule Commonly known as:  CARDIZEM CD Reported on 03/29/2016   ELIQUIS 5 MG Tabs tablet Generic drug:  apixaban TAKE 1 TABLET(5 MG) BY MOUTH TWICE DAILY   fluticasone 50 MCG/ACT nasal spray Commonly known as:  FLONASE Place into the nose.   GERITOL PO Take by mouth.   loperamide 2 MG capsule Commonly known as:  IMODIUM Take 2 mg by mouth as needed for  diarrhea or loose stools. Reported on 04/26/2016   montelukast 10 MG tablet Commonly known as:  SINGULAIR TAKE 1 TABLET BY MOUTH ONCE A DAY AS DIRECTED BY MD   omeprazole 20 MG capsule Commonly known as:  PRILOSEC Take 20 mg by mouth daily.   PAIN MANAGEMENT IT PUMP REFILL 1 each by Intrathecal route once. Medication: PF Fentanyl 1,500.0 mcg/ml PF Bupivicaine 30.0 mg/ml PF Clonidine 300.4mg/ml Total Volume: 40 ml Needed by 03-23-16 '@1340'    potassium chloride SA 20 MEQ tablet Commonly known as:  K-DUR,KLOR-CON TAKE 1 TABLET(20 MEQ) BY MOUTH TWICE DAILY   RA VITAMIN B-12 TR 1000 MCG Tbcr Generic drug:  Cyanocobalamin Take 1,000 mcg by mouth daily.   ranitidine 150 MG tablet Commonly known as:  ZANTAC Take 150 mg by mouth.   tamsulosin 0.4 MG Caps capsule Commonly known as:  FLOMAX Take 1 capsule (0.4 mg total) by mouth daily.   valACYclovir 500 MG tablet Commonly known as:  VALTREX TAKE 1 TABLET BY MOUTH DAILY   Vitamin D3 2000 units capsule Take 2,000 Units by mouth daily.       Allergies:  Allergies  Allergen Reactions  . Azithromycin Diarrhea    Possible cause of C. Diff Possible cause of C. Diff  . 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 Rivaroxaban Rash    Family History: Family History  Problem Relation Age of Onset  . Cancer Father     throat  . Bladder Cancer Neg Hx   . Kidney disease Sister   . Prostate cancer Neg Hx   . Stroke      Social History:  reports that he has quit smoking. His smoking use included Cigarettes. He has a 30.00 pack-year smoking history. He does not have any smokeless tobacco history on file. His alcohol and drug histories are not on file.  ROS: UROLOGY Frequent Urination?: No Hard to postpone urination?: No Burning/pain with urination?: No Get up  at night to urinate?: Yes Leakage of urine?: No Urine stream starts and stops?: No Trouble starting stream?: No Do you have to  strain to urinate?: No Blood in urine?: No Urinary tract infection?: No Sexually transmitted disease?: No Injury to kidneys or bladder?: No Painful intercourse?: No Weak stream?: No Erection problems?: No Penile pain?: No  Gastrointestinal Nausea?: No Vomiting?: No Indigestion/heartburn?: No Diarrhea?: Yes Constipation?: No  Constitutional Fever: No Night sweats?: No Weight loss?: No Fatigue?: No  Skin Skin rash/lesions?: No Itching?: No  Eyes Blurred vision?: No Double vision?: No  Ears/Nose/Throat Sore throat?: No Sinus problems?: No  Hematologic/Lymphatic Swollen glands?: No Easy bruising?: No  Cardiovascular Leg swelling?: No Chest pain?: No  Respiratory Cough?: No Shortness of breath?: No  Endocrine Excessive thirst?: No  Musculoskeletal Back pain?: Yes Joint pain?: No  Neurological Headaches?: No Dizziness?: No  Psychologic Depression?: No Anxiety?: No  Physical Exam: BP 121/81   Pulse 81   Ht '5\' 7"'  (1.702 m)   Wt 171 lb 8 oz (77.8 kg)   BMI 26.86 kg/m   Constitutional: Well nourished. Alert and oriented, No acute distress. HEENT: [place] AT, moist mucus membranes. Trachea midline, no masses. Cardiovascular: No clubbing, cyanosis, or edema. Respiratory: Normal respiratory effort, no increased work of breathing. GI: Abdomen is soft, non tender, non distended, no abdominal masses. Liver and spleen not palpable.  No hernias appreciated.  Stool sample for occult testing is not indicated.   GU: No CVA tenderness.  No bladder fullness or masses.  Patient with circumcised phallus.  Urethral meatus is patent.  No penile discharge. No penile lesions or rashes. Scrotum without lesions, cysts, rashes and/or edema.  Testicles are located scrotally bilaterally. They are atrophic.  No masses are appreciated in the testicles.  Left and right epididymis are normal. Rectal: Patient with  normal sphincter tone. Anus and perineum without scarring or rashes. No rectal masses are appreciated. Prostate is approximately 50 grams, no nodules are appreciated. Seminal vesicles are normal. Skin: No rashes, bruises or suspicious lesions. Lymph: No cervical or inguinal adenopathy. Neurologic: Grossly intact, no focal deficits, moving all 4 extremities. Psychiatric: Normal mood and affect.  Laboratory Data: Lab Results  Component Value Date   WBC 9.5 04/26/2016   HGB 12.8 (L) 04/26/2016   HCT 37.5 (L) 04/26/2016   MCV 101.9 (H) 04/26/2016   PLT 172 04/26/2016    Lab Results  Component Value Date   CREATININE 1.32 (H) 04/26/2016    PSA History:    2.9 ng/mL on 04/30/2012    1.9 ng/mL on 05/01/2013    1.5 ng/mL on 04/30/2014  Lab Results  Component Value Date   AST 17 04/26/2016   Lab Results  Component Value Date   ALT 16 (L) 04/26/2016     Assessment & Plan:    1. BPH with LUTS  - IPSS score is 5/3  - Continue conservative management, avoiding bladder irritants  - Discontinue tamsulosin 0.4 mg daily, restart if symptoms worsen  - RTC in 12 months for IPSS and exam   Return in about 1 year (around 05/01/2017) for IPSS and exam.  These notes generated with voice recognition software. I apologize for typographical errors.  Zara Council, [facility] [address] [place], [place] [address] [phone]

## 2016-05-01 NOTE — Addendum Note (Signed)
Addended by: Orlene Erm on: 05/01/2016 09:15 AM   Modules accepted: Orders

## 2016-05-02 ENCOUNTER — Encounter
Admission: RE | Disposition: A | Discharge status: Home / Self Care | Payer: Self-pay | Source: Ambulatory Visit | Attending: Ophthalmology

## 2016-05-02 ENCOUNTER — Telehealth: Payer: Self-pay

## 2016-05-02 ENCOUNTER — Ambulatory Visit: Payer: Medicare Other | Admitting: Anesthesiology

## 2016-05-02 ENCOUNTER — Encounter: Payer: Self-pay | Admitting: *Deleted

## 2016-05-02 ENCOUNTER — Ambulatory Visit
Admission: RE | Admit: 2016-05-02 | Discharge: 2016-05-02 | Disposition: A | Discharge status: Home / Self Care | Payer: Medicare Other | Source: Ambulatory Visit | Attending: Ophthalmology | Admitting: Ophthalmology

## 2016-05-02 DIAGNOSIS — E785 Hyperlipidemia, unspecified: Secondary | ICD-10-CM | POA: Diagnosis not present

## 2016-05-02 DIAGNOSIS — H2511 Age-related nuclear cataract, right eye: Secondary | ICD-10-CM | POA: Insufficient documentation

## 2016-05-02 DIAGNOSIS — H9193 Unspecified hearing loss, bilateral: Secondary | ICD-10-CM | POA: Diagnosis not present

## 2016-05-02 DIAGNOSIS — F419 Anxiety disorder, unspecified: Secondary | ICD-10-CM | POA: Insufficient documentation

## 2016-05-02 DIAGNOSIS — N400 Enlarged prostate without lower urinary tract symptoms: Secondary | ICD-10-CM | POA: Insufficient documentation

## 2016-05-02 DIAGNOSIS — Z86711 Personal history of pulmonary embolism: Secondary | ICD-10-CM | POA: Insufficient documentation

## 2016-05-02 DIAGNOSIS — M1990 Unspecified osteoarthritis, unspecified site: Secondary | ICD-10-CM | POA: Insufficient documentation

## 2016-05-02 DIAGNOSIS — Z87891 Personal history of nicotine dependence: Secondary | ICD-10-CM | POA: Insufficient documentation

## 2016-05-02 DIAGNOSIS — I10 Essential (primary) hypertension: Secondary | ICD-10-CM | POA: Insufficient documentation

## 2016-05-02 DIAGNOSIS — Z8673 Personal history of transient ischemic attack (TIA), and cerebral infarction without residual deficits: Secondary | ICD-10-CM | POA: Insufficient documentation

## 2016-05-02 DIAGNOSIS — G709 Myoneural disorder, unspecified: Secondary | ICD-10-CM | POA: Diagnosis not present

## 2016-05-02 DIAGNOSIS — J449 Chronic obstructive pulmonary disease, unspecified: Secondary | ICD-10-CM | POA: Diagnosis not present

## 2016-05-02 DIAGNOSIS — K219 Gastro-esophageal reflux disease without esophagitis: Secondary | ICD-10-CM | POA: Insufficient documentation

## 2016-05-02 HISTORY — PX: CATARACT EXTRACTION W/PHACO: SHX586

## 2016-05-02 HISTORY — DX: Unspecified hearing loss, unspecified ear: H91.90

## 2016-05-02 HISTORY — DX: Pneumonia, unspecified organism: J18.9

## 2016-05-02 HISTORY — DX: Polyneuropathy, unspecified: G62.9

## 2016-05-02 LAB — PSA: PROSTATE SPECIFIC AG, SERUM: 0.9 ng/mL (ref 0.0–4.0)

## 2016-05-02 SURGERY — PHACOEMULSIFICATION, CATARACT, WITH IOL INSERTION
Anesthesia: Monitor Anesthesia Care | Site: Eye | Laterality: Right | Wound class: Clean

## 2016-05-02 MED ORDER — SODIUM CHLORIDE 0.9 % IV SOLN
INTRAVENOUS | Status: DC
  Administered 2016-05-02: 13:00:00 via INTRAVENOUS

## 2016-05-02 MED ORDER — EPINEPHRINE HCL 1 MG/ML IJ SOLN
INTRAMUSCULAR | Status: AC
Start: 2016-05-02 — End: 2016-05-02
  Filled 2016-05-02: qty 1

## 2016-05-02 MED ORDER — MOXIFLOXACIN HCL 0.5 % OP SOLN
OPHTHALMIC | Status: AC
  Filled 2016-05-02: qty 3

## 2016-05-02 MED ORDER — MIDAZOLAM HCL 2 MG/2ML IJ SOLN
INTRAMUSCULAR | Status: DC | PRN
  Administered 2016-05-02: 1 mg via INTRAVENOUS

## 2016-05-02 MED ORDER — MOXIFLOXACIN HCL 0.5 % OP SOLN: 1.0000 [drp] | OPHTHALMIC | Status: DC | PRN

## 2016-05-02 MED ORDER — CEFUROXIME OPHTHALMIC INJECTION 1 MG/0.1 ML
INJECTION | OPHTHALMIC | Status: DC | PRN
  Administered 2016-05-02: 0.1 mL via INTRACAMERAL

## 2016-05-02 MED ORDER — ARMC OPHTHALMIC DILATING GEL
1.0000 "application " | OPHTHALMIC | Status: DC | PRN
  Administered 2016-05-02: 1 via OPHTHALMIC

## 2016-05-02 MED ORDER — CARBACHOL 0.01 % IO SOLN
INTRAOCULAR | Status: DC | PRN
  Administered 2016-05-02: 0.5 mL via INTRAOCULAR

## 2016-05-02 MED ORDER — FENTANYL CITRATE (PF) 100 MCG/2ML IJ SOLN
INTRAMUSCULAR | Status: DC | PRN
  Administered 2016-05-02: 50 ug via INTRAVENOUS

## 2016-05-02 MED ORDER — NA CHONDROIT SULF-NA HYALURON 40-17 MG/ML IO SOLN
INTRAOCULAR | Status: DC | PRN
  Administered 2016-05-02: 1 mL via INTRAOCULAR

## 2016-05-02 MED ORDER — POVIDONE-IODINE 5 % OP SOLN
OPHTHALMIC | Status: AC
  Filled 2016-05-02: qty 30

## 2016-05-02 MED ORDER — MOXIFLOXACIN HCL 0.5 % OP SOLN
OPHTHALMIC | Status: DC | PRN
  Administered 2016-05-02: 1 [drp] via OPHTHALMIC

## 2016-05-02 MED ORDER — POVIDONE-IODINE 5 % OP SOLN: 1.0000 "application " | Freq: Once | OPHTHALMIC | Status: DC

## 2016-05-02 MED ORDER — NA CHONDROIT SULF-NA HYALURON 40-17 MG/ML IO SOLN
INTRAOCULAR | Status: AC
  Filled 2016-05-02: qty 1

## 2016-05-02 MED ORDER — ARMC OPHTHALMIC DILATING GEL
OPHTHALMIC | Status: AC
  Administered 2016-05-02: 1 via OPHTHALMIC
  Filled 2016-05-02: qty 0.25

## 2016-05-02 MED ORDER — EPINEPHRINE HCL 1 MG/ML IJ SOLN
INTRAOCULAR | Status: DC | PRN
  Administered 2016-05-02: 1 mL via OPHTHALMIC

## 2016-05-02 MED ORDER — CEFUROXIME OPHTHALMIC INJECTION 1 MG/0.1 ML
INJECTION | OPHTHALMIC | Status: AC
  Filled 2016-05-02: qty 0.1

## 2016-05-02 MED ORDER — TETRACAINE HCL 0.5 % OP SOLN
OPHTHALMIC | Status: AC
Start: 2016-05-02 — End: 2016-05-02
  Administered 2016-05-02: 1 [drp] via OPHTHALMIC
  Filled 2016-05-02: qty 2

## 2016-05-02 MED ORDER — TETRACAINE HCL 0.5 % OP SOLN
1.0000 [drp] | Freq: Once | OPHTHALMIC | Status: AC
  Administered 2016-05-02: 1 [drp] via OPHTHALMIC

## 2016-05-02 SURGICAL SUPPLY — 21 items
CANNULA ANT/CHMB 27GA (MISCELLANEOUS) ×3 IMPLANT
CUP MEDICINE 2OZ PLAST GRAD ST (MISCELLANEOUS) ×3 IMPLANT
GLOVE BIO SURGEON STRL SZ8 (GLOVE) ×3 IMPLANT
GLOVE BIOGEL M 6.5 STRL (GLOVE) ×3 IMPLANT
GLOVE SURG LX 8.0 MICRO (GLOVE) ×2
GLOVE SURG LX STRL 8.0 MICRO (GLOVE) ×1 IMPLANT
GOWN STRL REUS W/ TWL LRG LVL3 (GOWN DISPOSABLE) ×2 IMPLANT
GOWN STRL REUS W/TWL LRG LVL3 (GOWN DISPOSABLE) ×4
LENS IOL TECNIS ITEC 14.0 (Intraocular Lens) ×3 IMPLANT
PACK CATARACT (MISCELLANEOUS) ×3 IMPLANT
PACK CATARACT BRASINGTON LX (MISCELLANEOUS) ×3 IMPLANT
PACK EYE AFTER SURG (MISCELLANEOUS) ×3 IMPLANT
SOL BSS BAG (MISCELLANEOUS) ×3
SOL PREP PVP 2OZ (MISCELLANEOUS) ×3
SOLUTION BSS BAG (MISCELLANEOUS) ×1 IMPLANT
SOLUTION PREP PVP 2OZ (MISCELLANEOUS) ×1 IMPLANT
SYR 3ML LL SCALE MARK (SYRINGE) ×3 IMPLANT
SYR 5ML LL (SYRINGE) ×3 IMPLANT
SYR TB 1ML 27GX1/2 LL (SYRINGE) ×3 IMPLANT
WATER STERILE IRR 1000ML POUR (IV SOLUTION) ×3 IMPLANT
WIPE NON LINTING 3.25X3.25 (MISCELLANEOUS) ×3 IMPLANT

## 2016-05-02 NOTE — H&P (Signed)
  All labs reviewed. Abnormal studies sent to patients PCP when indicated.  Previous H&P reviewed, patient examined, there are NO CHANGES.  Logan Quezada LOUIS7/25/20171:40 PM

## 2016-05-02 NOTE — Anesthesia Preprocedure Evaluation (Signed)
Anesthesia Evaluation  Patient identified by MRN, date of birth, ID band Patient awake    Reviewed: Allergy & Precautions, H&P , NPO status , Patient's Chart, lab work & pertinent test results  History of Anesthesia Complications Negative for: history of anesthetic complications  Airway Mallampati: III  TM Distance: >3 FB Neck ROM: limited    Dental  (+) Poor Dentition   Pulmonary pneumonia, COPD, former smoker,    Pulmonary exam normal breath sounds clear to auscultation       Cardiovascular Exercise Tolerance: Good hypertension, Normal cardiovascular exam Rhythm:regular Rate:Normal     Neuro/Psych PSYCHIATRIC DISORDERS Anxiety  Neuromuscular disease CVA    GI/Hepatic Neg liver ROS, GERD  ,  Endo/Other  negative endocrine ROS  Renal/GU negative Renal ROS  negative genitourinary   Musculoskeletal  (+) Arthritis ,   Abdominal   Peds  Hematology negative hematology ROS (+)   Anesthesia Other Findings Past Medical History: No date: Anxiety No date: Atrial fibrillation (HCC) No date: BPH (benign prostatic hyperplasia) No date: Difficulty voiding No date: Elevated PSA No date: GERD (gastroesophageal reflux disease) No date: HLD (hyperlipidemia) No date: HOH (hard of hearing) No date: Hypertension No date: Multiple myeloma ([place]) No date: Neuropathy ([place]) No date: Pneumonia No date: Pulmonary embolism ([place]) No date: Stroke [facility])  Past Surgical History: 1994: BACK SURGERY 1992: KNEE ARTHROSCOPY Left 2012: PAIN PUMP IMPLANTATION 2008: stem cell implant     Comment: [facility]  BMI    Body Mass Index:  26.78 kg/m      Reproductive/Obstetrics negative OB ROS                             Anesthesia Physical Anesthesia Plan  ASA: III  Anesthesia Plan: MAC   Post-op Pain Management:    Induction:   Airway Management Planned:   Additional Equipment:   Intra-op Plan:    Post-operative Plan:   Informed Consent: I have reviewed the patients History and Physical, chart, labs and discussed the procedure including the risks, benefits and alternatives for the proposed anesthesia with the patient or authorized representative who has indicated his/her understanding and acceptance.   Dental Advisory Given  Plan Discussed with: Anesthesiologist, CRNA and Surgeon  Anesthesia Plan Comments:         Anesthesia Quick Evaluation

## 2016-05-02 NOTE — Discharge Instructions (Signed)
Eye Surgery Discharge Instructions  Expect mild scratchy sensation or mild soreness. DO NOT RUB YOUR EYE!  The day of surgery:  Minimal physical activity, but bed rest is not required  No reading, computer work, or close hand work  No bending, lifting, or straining.  May watch TV  For 24 hours:  No driving, legal decisions, or alcoholic beverages  Safety precautions  Eat anything you prefer: It is better to start with liquids, then soup then solid foods.  _____ Eye patch should be worn until postoperative exam tomorrow.  ____ Solar shield eyeglasses should be worn for comfort in the sunlight/patch while sleeping  Resume all regular medications including aspirin or Coumadin if these were discontinued prior to surgery. You may shower, bathe, shave, or wash your hair. Tylenol may be taken for mild discomfort.  Call your doctor if you experience significant pain, nausea, or vomiting, fever > 101 or other signs of infection. [phone] or [phone] Specific instructions:  Follow-up Information    PORFILIO,WILLIAM LOUIS, MD Follow up on 05/03/2016.   Specialty:  Ophthalmology Why:  at 10:20 am Contact information: [address] [place] [address] [phone]

## 2016-05-02 NOTE — Telephone Encounter (Signed)
-----   Message from Nori Riis, PA-C sent at 05/02/2016  8:08 AM EDT ----- Please notify the patient that his PSA is actually decreased. We'll see him in one year.

## 2016-05-02 NOTE — Op Note (Signed)
PREOPERATIVE DIAGNOSIS:  Nuclear sclerotic cataract of the right eye.   POSTOPERATIVE DIAGNOSIS: NUCLEAR SCLERTOIC CATARACT RIGHT EYE   OPERATIVE PROCEDURE:  Procedure(s): CATARACT EXTRACTION PHACO AND INTRAOCULAR LENS PLACEMENT (IOC)   SURGEON:  Birder Robson, MD.   ANESTHESIA:  Anesthesiologist: Andria Frames, MD CRNA: Rolla Plate, CRNA; Bernardo Heater, CRNA  1.      Managed anesthesia care. 2.      Topical tetracaine drops followed by 2% Xylocaine jelly applied in the preoperative holding area.   COMPLICATIONS:  None.   TECHNIQUE:   Stop and chop   DESCRIPTION OF PROCEDURE:  The patient was examined and consented in the preoperative holding area where the aforementioned topical anesthesia was applied to the right eye and then brought back to the Operating Room where the right eye was prepped and draped in the usual sterile ophthalmic fashion and a lid speculum was placed. A paracentesis was created with the side port blade and the anterior chamber was filled with viscoelastic. A near clear corneal incision was performed with the steel keratome. A continuous curvilinear capsulorrhexis was performed with a cystotome followed by the capsulorrhexis forceps. Hydrodissection and hydrodelineation were carried out with BSS on a blunt cannula. The lens was removed in a stop and chop  technique and the remaining cortical material was removed with the irrigation-aspiration handpiece. The capsular bag was inflated with viscoelastic and the Technis ZCB00  lens was placed in the capsular bag without complication. The remaining viscoelastic was removed from the eye with the irrigation-aspiration handpiece. The wounds were hydrated. The anterior chamber was flushed with Miostat and the eye was inflated to physiologic pressure. 0.1 mL of cefuroxime concentration 10 mg/mL was placed in the anterior chamber. The wounds were found to be water tight. The eye was dressed with Vigamox. The patient was  given protective glasses to wear throughout the day and a shield with which to sleep tonight. The patient was also given drops with which to begin a drop regimen today and will follow-up with me in one day.  Implant Name Type Inv. Item Serial No. Manufacturer Lot No. LRB No. Used  LENS IOL DIOP 14.0 - [id number] Intraocular Lens LENS IOL DIOP 14.0 [id number]   Right 1   Procedure(s) with comments: CATARACT EXTRACTION PHACO AND INTRAOCULAR LENS PLACEMENT (IOC) (Right) - [place] 1.06 AP% 20.6 CDE 13.70 FLUID PACK LOT # [place]:[id number]  Electronically signed: Raed Schalk LOUIS 05/02/2016 2:07 PM

## 2016-05-02 NOTE — Telephone Encounter (Signed)
Spoke with pt in reference to PSA results and f/u in 34yr. Pt voiced understanding.

## 2016-05-02 NOTE — Anesthesia Postprocedure Evaluation (Signed)
Anesthesia Post Note  Patient: Logan Perez  Procedure(s) Performed: Procedure(s) (LRB): CATARACT EXTRACTION PHACO AND INTRAOCULAR LENS PLACEMENT (IOC) (Right)  Patient location during evaluation: PACU Anesthesia Type: MAC Level of consciousness: awake, awake and alert, oriented and patient cooperative Pain management: pain level controlled Vital Signs Assessment: post-procedure vital signs reviewed and stable Respiratory status: spontaneous breathing Cardiovascular status: blood pressure returned to baseline and stable Anesthetic complications: no    Last Vitals:  Vitals:   05/02/16 1200  BP: 96/69  Pulse: (!) 107  Resp: 18  Temp: 36.6 C    Last Pain:  Vitals:   05/02/16 1200  TempSrc: Oral  PainSc: 3                  Bernardo Heater

## 2016-05-02 NOTE — Transfer of Care (Signed)
Immediate Anesthesia Transfer of Care Note  Patient: Logan Perez  Procedure(s) Performed: Procedure(s) with comments: CATARACT EXTRACTION PHACO AND INTRAOCULAR LENS PLACEMENT (IOC) (Right) - [place] 1.06 AP% 20.6 CDE 13.70 FLUID PACK LOT # [place]:[id number]  Patient Location: PACU  Anesthesia Type:MAC  Level of Consciousness: awake, alert , oriented and patient cooperative  Airway & Oxygen Therapy: Patient Spontanous Breathing  Post-op Assessment: Report given to RN and Post -op Vital signs reviewed and stable  Post vital signs: Reviewed and stable  Last Vitals:  Vitals:   05/02/16 1200  BP: 96/69  Pulse: (!) 107  Resp: 18  Temp: 36.6 C    Last Pain:  Vitals:   05/02/16 1200  TempSrc: Oral  PainSc: 3          Complications: No apparent anesthesia complications

## 2016-05-09 ENCOUNTER — Other Ambulatory Visit: Payer: Self-pay | Admitting: Hematology and Oncology

## 2016-05-09 ENCOUNTER — Encounter: Payer: Self-pay | Admitting: *Deleted

## 2016-05-10 ENCOUNTER — Inpatient Hospital Stay: Payer: Medicare Other | Attending: Hematology and Oncology

## 2016-05-10 DIAGNOSIS — F17210 Nicotine dependence, cigarettes, uncomplicated: Secondary | ICD-10-CM | POA: Insufficient documentation

## 2016-05-10 DIAGNOSIS — N400 Enlarged prostate without lower urinary tract symptoms: Secondary | ICD-10-CM | POA: Diagnosis not present

## 2016-05-10 DIAGNOSIS — E785 Hyperlipidemia, unspecified: Secondary | ICD-10-CM | POA: Insufficient documentation

## 2016-05-10 DIAGNOSIS — G629 Polyneuropathy, unspecified: Secondary | ICD-10-CM | POA: Diagnosis not present

## 2016-05-10 DIAGNOSIS — K529 Noninfective gastroenteritis and colitis, unspecified: Secondary | ICD-10-CM | POA: Insufficient documentation

## 2016-05-10 DIAGNOSIS — Z79899 Other long term (current) drug therapy: Secondary | ICD-10-CM | POA: Diagnosis not present

## 2016-05-10 DIAGNOSIS — Z86711 Personal history of pulmonary embolism: Secondary | ICD-10-CM | POA: Diagnosis not present

## 2016-05-10 DIAGNOSIS — C9002 Multiple myeloma in relapse: Secondary | ICD-10-CM | POA: Diagnosis not present

## 2016-05-10 DIAGNOSIS — Z5112 Encounter for antineoplastic immunotherapy: Secondary | ICD-10-CM | POA: Insufficient documentation

## 2016-05-10 DIAGNOSIS — Z7901 Long term (current) use of anticoagulants: Secondary | ICD-10-CM | POA: Diagnosis not present

## 2016-05-10 DIAGNOSIS — K219 Gastro-esophageal reflux disease without esophagitis: Secondary | ICD-10-CM | POA: Diagnosis not present

## 2016-05-10 DIAGNOSIS — I493 Ventricular premature depolarization: Secondary | ICD-10-CM | POA: Insufficient documentation

## 2016-05-10 DIAGNOSIS — Z9484 Stem cells transplant status: Secondary | ICD-10-CM | POA: Insufficient documentation

## 2016-05-10 DIAGNOSIS — I10 Essential (primary) hypertension: Secondary | ICD-10-CM | POA: Insufficient documentation

## 2016-05-10 DIAGNOSIS — F419 Anxiety disorder, unspecified: Secondary | ICD-10-CM | POA: Insufficient documentation

## 2016-05-10 DIAGNOSIS — I4891 Unspecified atrial fibrillation: Secondary | ICD-10-CM | POA: Insufficient documentation

## 2016-05-10 DIAGNOSIS — Z8673 Personal history of transient ischemic attack (TIA), and cerebral infarction without residual deficits: Secondary | ICD-10-CM | POA: Diagnosis not present

## 2016-05-10 DIAGNOSIS — R5383 Other fatigue: Secondary | ICD-10-CM | POA: Insufficient documentation

## 2016-05-10 LAB — CBC WITH DIFFERENTIAL/PLATELET
Basophils Absolute: 0 10*3/uL (ref 0–0.1)
Basophils Relative: 0 %
Eosinophils Absolute: 0.1 10*3/uL (ref 0–0.7)
Eosinophils Relative: 1 %
HCT: 38 % — ABNORMAL LOW (ref 40.0–52.0)
Hemoglobin: 13 g/dL (ref 13.0–18.0)
Lymphocytes Relative: 7 %
Lymphs Abs: 0.5 10*3/uL — ABNORMAL LOW (ref 1.0–3.6)
MCH: 35.1 pg — ABNORMAL HIGH (ref 26.0–34.0)
MCHC: 34.3 g/dL (ref 32.0–36.0)
MCV: 102.5 fL — ABNORMAL HIGH (ref 80.0–100.0)
Monocytes Absolute: 0.8 10*3/uL (ref 0.2–1.0)
Monocytes Relative: 10 %
Neutro Abs: 6.7 10*3/uL — ABNORMAL HIGH (ref 1.4–6.5)
Neutrophils Relative %: 82 %
Platelets: 167 10*3/uL (ref 150–440)
RBC: 3.71 MIL/uL — ABNORMAL LOW (ref 4.40–5.90)
RDW: 14.5 % (ref 11.5–14.5)
WBC: 8.2 10*3/uL (ref 3.8–10.6)

## 2016-05-10 LAB — COMPREHENSIVE METABOLIC PANEL
ALT: 15 U/L — ABNORMAL LOW (ref 17–63)
AST: 14 U/L — ABNORMAL LOW (ref 15–41)
Albumin: 3.9 g/dL (ref 3.5–5.0)
Alkaline Phosphatase: 44 U/L (ref 38–126)
Anion gap: 5 (ref 5–15)
BUN: 13 mg/dL (ref 6–20)
CO2: 27 mmol/L (ref 22–32)
Calcium: 9.6 mg/dL (ref 8.9–10.3)
Chloride: 107 mmol/L (ref 101–111)
Creatinine, Ser: 1.18 mg/dL (ref 0.61–1.24)
GFR calc Af Amer: >60 mL/min (ref >60)
GFR calc non Af Amer: >60 mL/min (ref >60)
Glucose, Bld: 130 mg/dL — ABNORMAL HIGH (ref 65–99)
Potassium: 4.4 mmol/L (ref 3.5–5.1)
Sodium: 139 mmol/L (ref 135–145)
Total Bilirubin: 0.6 mg/dL (ref 0.3–1.2)
Total Protein: 6.2 g/dL — ABNORMAL LOW (ref 6.5–8.1)

## 2016-05-11 ENCOUNTER — Inpatient Hospital Stay: Payer: Medicare Other

## 2016-05-11 VITALS — BP 132/81 | HR 67 | Temp 96.4°F

## 2016-05-11 DIAGNOSIS — Z5112 Encounter for antineoplastic immunotherapy: Secondary | ICD-10-CM | POA: Diagnosis not present

## 2016-05-11 DIAGNOSIS — C9002 Multiple myeloma in relapse: Secondary | ICD-10-CM

## 2016-05-11 MED ORDER — SODIUM CHLORIDE 0.9 % IV SOLN
Freq: Once | INTRAVENOUS | Status: AC
  Administered 2016-05-11: 10:00:00 via INTRAVENOUS
  Filled 2016-05-11: qty 4

## 2016-05-11 MED ORDER — DIPHENHYDRAMINE HCL 25 MG PO CAPS
50.0000 mg | ORAL_CAPSULE | Freq: Once | ORAL | Status: AC
  Administered 2016-05-11: 50 mg via ORAL
  Filled 2016-05-11: qty 2

## 2016-05-11 MED ORDER — SODIUM CHLORIDE 0.9 % IV SOLN
1200.0000 mg | Freq: Once | INTRAVENOUS | Status: AC
  Administered 2016-05-11: 1200 mg via INTRAVENOUS
  Filled 2016-05-11: qty 60

## 2016-05-11 MED ORDER — ACETAMINOPHEN 325 MG PO TABS
650.0000 mg | ORAL_TABLET | Freq: Once | ORAL | Status: AC
  Administered 2016-05-11: 650 mg via ORAL
  Filled 2016-05-11: qty 2

## 2016-05-11 MED ORDER — SODIUM CHLORIDE 0.9 % IV SOLN
Freq: Once | INTRAVENOUS | Status: AC
  Administered 2016-05-11: 10:00:00 via INTRAVENOUS
  Filled 2016-05-11: qty 1000

## 2016-05-16 ENCOUNTER — Ambulatory Visit: Payer: Medicare Other | Admitting: Anesthesiology

## 2016-05-16 ENCOUNTER — Encounter
Admission: RE | Disposition: A | Discharge status: Home / Self Care | Payer: Self-pay | Source: Ambulatory Visit | Attending: Ophthalmology

## 2016-05-16 ENCOUNTER — Encounter: Payer: Self-pay | Admitting: Anesthesiology

## 2016-05-16 ENCOUNTER — Ambulatory Visit
Admission: RE | Admit: 2016-05-16 | Discharge: 2016-05-16 | Disposition: A | Discharge status: Home / Self Care | Payer: Medicare Other | Source: Ambulatory Visit | Attending: Ophthalmology | Admitting: Ophthalmology

## 2016-05-16 DIAGNOSIS — Z87891 Personal history of nicotine dependence: Secondary | ICD-10-CM | POA: Diagnosis not present

## 2016-05-16 DIAGNOSIS — G629 Polyneuropathy, unspecified: Secondary | ICD-10-CM | POA: Insufficient documentation

## 2016-05-16 DIAGNOSIS — Z7901 Long term (current) use of anticoagulants: Secondary | ICD-10-CM | POA: Diagnosis not present

## 2016-05-16 DIAGNOSIS — Z9889 Other specified postprocedural states: Secondary | ICD-10-CM | POA: Insufficient documentation

## 2016-05-16 DIAGNOSIS — M8580 Other specified disorders of bone density and structure, unspecified site: Secondary | ICD-10-CM | POA: Diagnosis not present

## 2016-05-16 DIAGNOSIS — Z8701 Personal history of pneumonia (recurrent): Secondary | ICD-10-CM | POA: Diagnosis not present

## 2016-05-16 DIAGNOSIS — C9000 Multiple myeloma not having achieved remission: Secondary | ICD-10-CM | POA: Insufficient documentation

## 2016-05-16 DIAGNOSIS — Z881 Allergy status to other antibiotic agents status: Secondary | ICD-10-CM | POA: Insufficient documentation

## 2016-05-16 DIAGNOSIS — Z9841 Cataract extraction status, right eye: Secondary | ICD-10-CM | POA: Diagnosis not present

## 2016-05-16 DIAGNOSIS — Z8673 Personal history of transient ischemic attack (TIA), and cerebral infarction without residual deficits: Secondary | ICD-10-CM | POA: Diagnosis not present

## 2016-05-16 DIAGNOSIS — Z9109 Other allergy status, other than to drugs and biological substances: Secondary | ICD-10-CM | POA: Diagnosis not present

## 2016-05-16 DIAGNOSIS — K219 Gastro-esophageal reflux disease without esophagitis: Secondary | ICD-10-CM | POA: Diagnosis not present

## 2016-05-16 DIAGNOSIS — H9193 Unspecified hearing loss, bilateral: Secondary | ICD-10-CM | POA: Diagnosis not present

## 2016-05-16 DIAGNOSIS — Z86711 Personal history of pulmonary embolism: Secondary | ICD-10-CM | POA: Diagnosis not present

## 2016-05-16 DIAGNOSIS — H2512 Age-related nuclear cataract, left eye: Secondary | ICD-10-CM | POA: Insufficient documentation

## 2016-05-16 DIAGNOSIS — E7800 Pure hypercholesterolemia, unspecified: Secondary | ICD-10-CM | POA: Diagnosis not present

## 2016-05-16 DIAGNOSIS — F419 Anxiety disorder, unspecified: Secondary | ICD-10-CM | POA: Diagnosis not present

## 2016-05-16 DIAGNOSIS — Z888 Allergy status to other drugs, medicaments and biological substances status: Secondary | ICD-10-CM | POA: Insufficient documentation

## 2016-05-16 DIAGNOSIS — I10 Essential (primary) hypertension: Secondary | ICD-10-CM | POA: Diagnosis not present

## 2016-05-16 DIAGNOSIS — Z79899 Other long term (current) drug therapy: Secondary | ICD-10-CM | POA: Diagnosis not present

## 2016-05-16 DIAGNOSIS — K5790 Diverticulosis of intestine, part unspecified, without perforation or abscess without bleeding: Secondary | ICD-10-CM | POA: Diagnosis not present

## 2016-05-16 HISTORY — DX: Cardiac arrhythmia, unspecified: I49.9

## 2016-05-16 HISTORY — DX: Other complications of anesthesia, initial encounter: T88.59XA

## 2016-05-16 HISTORY — DX: Palpitations: R00.2

## 2016-05-16 HISTORY — DX: Adverse effect of unspecified anesthetic, initial encounter: T41.45XA

## 2016-05-16 HISTORY — DX: Pain, unspecified: R52

## 2016-05-16 HISTORY — PX: CATARACT EXTRACTION W/PHACO: SHX586

## 2016-05-16 SURGERY — PHACOEMULSIFICATION, CATARACT, WITH IOL INSERTION
Anesthesia: Monitor Anesthesia Care | Site: Eye | Laterality: Left | Wound class: Clean

## 2016-05-16 MED ORDER — ARMC OPHTHALMIC DILATING GEL
OPHTHALMIC | Status: DC
Start: 2016-05-16 — End: 2016-05-16
  Filled 2016-05-16: qty 0.25

## 2016-05-16 MED ORDER — NA CHONDROIT SULF-NA HYALURON 40-17 MG/ML IO SOLN
INTRAOCULAR | Status: AC
  Filled 2016-05-16: qty 1

## 2016-05-16 MED ORDER — NA CHONDROIT SULF-NA HYALURON 40-17 MG/ML IO SOLN
INTRAOCULAR | Status: DC | PRN
  Administered 2016-05-16: 1 mL via INTRAOCULAR

## 2016-05-16 MED ORDER — ARMC OPHTHALMIC DILATING GEL
1.0000 "application " | OPHTHALMIC | Status: AC | PRN
  Administered 2016-05-16 (×2): 1 via OPHTHALMIC

## 2016-05-16 MED ORDER — POVIDONE-IODINE 5 % OP SOLN
1.0000 "application " | Freq: Once | OPHTHALMIC | Status: AC
  Administered 2016-05-16: 1 via OPHTHALMIC

## 2016-05-16 MED ORDER — EPINEPHRINE HCL 1 MG/ML IJ SOLN
INTRAMUSCULAR | Status: AC
  Filled 2016-05-16: qty 1

## 2016-05-16 MED ORDER — TETRACAINE HCL 0.5 % OP SOLN
OPHTHALMIC | Status: AC
  Filled 2016-05-16: qty 2

## 2016-05-16 MED ORDER — SODIUM CHLORIDE 0.9 % IV SOLN
INTRAVENOUS | Status: DC
  Administered 2016-05-16: 11:00:00 via INTRAVENOUS

## 2016-05-16 MED ORDER — MOXIFLOXACIN HCL 0.5 % OP SOLN
OPHTHALMIC | Status: DC | PRN
  Administered 2016-05-16: 1 [drp] via OPHTHALMIC

## 2016-05-16 MED ORDER — CARBACHOL 0.01 % IO SOLN
INTRAOCULAR | Status: DC | PRN
  Administered 2016-05-16: .5 mL via INTRAOCULAR

## 2016-05-16 MED ORDER — MIDAZOLAM HCL 2 MG/2ML IJ SOLN
INTRAMUSCULAR | Status: DC | PRN
  Administered 2016-05-16 (×2): 1 mg via INTRAVENOUS

## 2016-05-16 MED ORDER — EPINEPHRINE HCL 1 MG/ML IJ SOLN
INTRAMUSCULAR | Status: DC | PRN
  Administered 2016-05-16: 1 mL via OPHTHALMIC

## 2016-05-16 MED ORDER — TETRACAINE HCL 0.5 % OP SOLN
1.0000 [drp] | Freq: Once | OPHTHALMIC | Status: AC
  Administered 2016-05-16: 1 [drp] via OPHTHALMIC

## 2016-05-16 MED ORDER — CEFUROXIME OPHTHALMIC INJECTION 1 MG/0.1 ML
INJECTION | OPHTHALMIC | Status: AC
  Filled 2016-05-16: qty 0.1

## 2016-05-16 MED ORDER — CEFUROXIME OPHTHALMIC INJECTION 1 MG/0.1 ML
INJECTION | OPHTHALMIC | Status: DC | PRN
  Administered 2016-05-16: .1 mL via INTRACAMERAL

## 2016-05-16 MED ORDER — MOXIFLOXACIN HCL 0.5 % OP SOLN
OPHTHALMIC | Status: AC
  Filled 2016-05-16: qty 3

## 2016-05-16 MED ORDER — MOXIFLOXACIN HCL 0.5 % OP SOLN: 1.0000 [drp] | OPHTHALMIC | Status: DC | PRN

## 2016-05-16 MED ORDER — POVIDONE-IODINE 5 % OP SOLN
OPHTHALMIC | Status: AC
  Filled 2016-05-16: qty 30

## 2016-05-16 MED ORDER — FENTANYL CITRATE (PF) 100 MCG/2ML IJ SOLN
INTRAMUSCULAR | Status: DC | PRN
  Administered 2016-05-16 (×2): 50 ug via INTRAVENOUS

## 2016-05-16 SURGICAL SUPPLY — 22 items
CANNULA ANT/CHMB 27GA (MISCELLANEOUS) ×3 IMPLANT
CUP MEDICINE 2OZ PLAST GRAD ST (MISCELLANEOUS) ×3 IMPLANT
GLOVE BIO SURGEON STRL SZ8 (GLOVE) ×3 IMPLANT
GLOVE BIOGEL M 6.5 STRL (GLOVE) ×3 IMPLANT
GLOVE SURG LX 8.0 MICRO (GLOVE) ×2
GLOVE SURG LX STRL 8.0 MICRO (GLOVE) ×1 IMPLANT
GOWN STRL REUS W/ TWL LRG LVL3 (GOWN DISPOSABLE) ×2 IMPLANT
GOWN STRL REUS W/TWL LRG LVL3 (GOWN DISPOSABLE) ×4
KIT SLEEVE INFUSION .9 MICRO (MISCELLANEOUS) ×3 IMPLANT
LENS IOL TECNIS ITEC 17.0 (Intraocular Lens) ×3 IMPLANT
PACK CATARACT (MISCELLANEOUS) ×3 IMPLANT
PACK CATARACT BRASINGTON LX (MISCELLANEOUS) ×3 IMPLANT
PACK EYE AFTER SURG (MISCELLANEOUS) ×3 IMPLANT
SOL BSS BAG (MISCELLANEOUS) ×3
SOL PREP PVP 2OZ (MISCELLANEOUS) ×3
SOLUTION BSS BAG (MISCELLANEOUS) ×1 IMPLANT
SOLUTION PREP PVP 2OZ (MISCELLANEOUS) ×1 IMPLANT
SYR 3ML LL SCALE MARK (SYRINGE) ×3 IMPLANT
SYR 5ML LL (SYRINGE) ×3 IMPLANT
SYR TB 1ML 27GX1/2 LL (SYRINGE) ×3 IMPLANT
WATER STERILE IRR 1000ML POUR (IV SOLUTION) ×3 IMPLANT
WIPE NON LINTING 3.25X3.25 (MISCELLANEOUS) ×3 IMPLANT

## 2016-05-16 NOTE — H&P (Signed)
  All labs reviewed. Abnormal studies sent to patients PCP when indicated.  Previous H&P reviewed, patient examined, there are NO CHANGES.  Cathe Bilger LOUIS8/8/201711:23 AM.handp

## 2016-05-16 NOTE — Anesthesia Procedure Notes (Signed)
Procedure Name: MAC Performed by: Shanaye Rief Pre-anesthesia Checklist: Patient identified, Emergency Drugs available, Patient being monitored, Suction available and Timeout performed Oxygen Delivery Method: Nasal cannula       

## 2016-05-16 NOTE — Anesthesia Preprocedure Evaluation (Addendum)
Anesthesia Evaluation  Patient identified by MRN, date of birth, ID band Patient awake    Reviewed: Allergy & Precautions, NPO status , Patient's Chart, lab work & pertinent test results, reviewed documented beta blocker date and time   History of Anesthesia Complications (+) history of anesthetic complications  Airway Mallampati: II  TM Distance: >3 FB     Dental  (+) Chipped   Pulmonary shortness of breath, pneumonia, former smoker,           Cardiovascular hypertension, Pt. on medications + dysrhythmias      Neuro/Psych Anxiety  Neuromuscular disease CVA, No Residual Symptoms    GI/Hepatic GERD  Controlled,  Endo/Other    Renal/GU      Musculoskeletal  (+) Arthritis ,   Abdominal   Peds  Hematology   Anesthesia Other Findings Intrathecal pump with fentanyl, marcaine and clonidine. Rate has not been changed for quite  a while. Occasional AF. Only TIAs, no overt stroke. Multiple myeloma.  Reproductive/Obstetrics                            Anesthesia Physical Anesthesia Plan  ASA: III  Anesthesia Plan: MAC   Post-op Pain Management:    Induction:   Airway Management Planned:   Additional Equipment:   Intra-op Plan:   Post-operative Plan:   Informed Consent: I have reviewed the patients History and Physical, chart, labs and discussed the procedure including the risks, benefits and alternatives for the proposed anesthesia with the patient or authorized representative who has indicated his/her understanding and acceptance.     Plan Discussed with: CRNA  Anesthesia Plan Comments:         Anesthesia Quick Evaluation  

## 2016-05-16 NOTE — Op Note (Signed)
PREOPERATIVE DIAGNOSIS:  Nuclear sclerotic cataract of the left eye.   POSTOPERATIVE DIAGNOSIS:  nuclear sclerotic cataract left eye   OPERATIVE PROCEDURE:  Procedure(s): CATARACT EXTRACTION PHACO AND INTRAOCULAR LENS PLACEMENT (IOC)   SURGEON:  Birder Robson, MD.   ANESTHESIA:   Anesthesiologist: Gunnar Bulla, MD CRNA: Demetrius Charity, CRNA  1.      Managed anesthesia care. 2.      Topical tetracaine drops followed by 2% Xylocaine jelly applied in the preoperative holding area.   COMPLICATIONS:  None.   TECHNIQUE:   Stop and chop   DESCRIPTION OF PROCEDURE:  The patient was examined and consented in the preoperative holding area where the aforementioned topical anesthesia was applied to the left eye and then brought back to the Operating Room where the left eye was prepped and draped in the usual sterile ophthalmic fashion and a lid speculum was placed. A paracentesis was created with the side port blade and the anterior chamber was filled with viscoelastic. A near clear corneal incision was performed with the steel keratome. A continuous curvilinear capsulorrhexis was performed with a cystotome followed by the capsulorrhexis forceps. Hydrodissection and hydrodelineation were carried out with BSS on a blunt cannula. The lens was removed in a stop and chop  technique and the remaining cortical material was removed with the irrigation-aspiration handpiece. The capsular bag was inflated with viscoelastic and the Technis ZCB00 lens was placed in the capsular bag without complication. The remaining viscoelastic was removed from the eye with the irrigation-aspiration handpiece. The wounds were hydrated. The anterior chamber was flushed with Miostat and the eye was inflated to physiologic pressure. 0.1 mL of cefuroxime concentration 10 mg/mL was placed in the anterior chamber. The wounds were found to be water tight. The eye was dressed with Vigamox. The patient was given protective glasses to  wear throughout the day and a shield with which to sleep tonight. The patient was also given drops with which to begin a drop regimen today and will follow-up with me in one day.  Implant Name Type Inv. Item Serial No. Manufacturer Lot No. LRB No. Used  LENS IOL DIOP 17.0 - [id number] Intraocular Lens LENS IOL DIOP 17.0 [id number] AMO   Left 1   Procedure(s) with comments: CATARACT EXTRACTION PHACO AND INTRAOCULAR LENS PLACEMENT (IOC) (Left) - [place] 01:43 AP% 19.8 CDE 20.45 FLUID PACK LOT [id number]  Electronically signed: [place] 05/16/2016 11:56 AM

## 2016-05-16 NOTE — Transfer of Care (Signed)
Immediate Anesthesia Transfer of Care Note  Patient: Logan Perez  Procedure(s) Performed: Procedure(s) with comments: CATARACT EXTRACTION PHACO AND INTRAOCULAR LENS PLACEMENT (IOC) (Left) - [place] 01:43 AP% 19.8 CDE 20.45 FLUID PACK LOT [id number]  Patient Location: PACU  Anesthesia Type:MAC  Level of Consciousness: awake, alert  and oriented  Airway & Oxygen Therapy: Patient Spontanous Breathing  Post-op Assessment: Report given to RN and Post -op Vital signs reviewed and stable  Post vital signs: Reviewed and stable  Last Vitals:  Vitals:   05/16/16 1021  BP: (!) 149/79  Pulse: (!) 108  Resp: 16  Temp: 36.9 C    Last Pain:  Vitals:   05/16/16 1021  TempSrc: Oral         Complications: No apparent anesthesia complications

## 2016-05-16 NOTE — Anesthesia Postprocedure Evaluation (Signed)
Anesthesia Post Note  Patient: Logan Perez  Procedure(s) Performed: Procedure(s) (LRB): CATARACT EXTRACTION PHACO AND INTRAOCULAR LENS PLACEMENT (IOC) (Left)  Patient location during evaluation: PACU Anesthesia Type: MAC Level of consciousness: awake and alert and oriented Pain management: satisfactory to patient Vital Signs Assessment: post-procedure vital signs reviewed and stable Respiratory status: spontaneous breathing Cardiovascular status: blood pressure returned to baseline Anesthetic complications: no    Last Vitals:  Vitals:   05/16/16 1021  BP: (!) 149/79  Pulse: (!) 108  Resp: 16  Temp: 36.9 C    Last Pain:  Vitals:   05/16/16 1021  TempSrc: Oral                 Blima Singer

## 2016-05-16 NOTE — Discharge Instructions (Signed)
Eye Surgery Discharge Instructions  Expect mild scratchy sensation or mild soreness. DO NOT RUB YOUR EYE!  The day of surgery:  Minimal physical activity, but bed rest is not required  No reading, computer work, or close hand work  No bending, lifting, or straining.  May watch TV  For 24 hours:  No driving, legal decisions, or alcoholic beverages  Safety precautions  Eat anything you prefer: It is better to start with liquids, then soup then solid foods.  _____ Eye patch should be worn until postoperative exam tomorrow.  ____ Solar shield eyeglasses should be worn for comfort in the sunlight/patch while sleeping  Resume all regular medications including aspirin or Coumadin if these were discontinued prior to surgery. You may shower, bathe, shave, or wash your hair. Tylenol may be taken for mild discomfort.  Call your doctor if you experience significant pain, nausea, or vomiting, fever > 101 or other signs of infection. [phone] or [phone] Specific instructions:  Follow-up Information    Tim Lair, MD .   Specialty:  Ophthalmology Why:  August 9 at 10:25am Contact information: [address] [place] [address] [phone]

## 2016-05-22 ENCOUNTER — Other Ambulatory Visit: Payer: Self-pay | Admitting: Hematology and Oncology

## 2016-05-24 ENCOUNTER — Other Ambulatory Visit: Payer: Self-pay | Admitting: Hematology and Oncology

## 2016-05-24 ENCOUNTER — Inpatient Hospital Stay: Payer: Medicare Other

## 2016-05-24 ENCOUNTER — Encounter: Payer: Self-pay | Admitting: Hematology and Oncology

## 2016-05-24 ENCOUNTER — Inpatient Hospital Stay (HOSPITAL_BASED_OUTPATIENT_CLINIC_OR_DEPARTMENT_OTHER): Payer: Medicare Other | Admitting: Hematology and Oncology

## 2016-05-24 VITALS — BP 137/87 | HR 86 | Temp 96.9°F | Resp 18 | Wt 169.9 lb

## 2016-05-24 DIAGNOSIS — G629 Polyneuropathy, unspecified: Secondary | ICD-10-CM

## 2016-05-24 DIAGNOSIS — N400 Enlarged prostate without lower urinary tract symptoms: Secondary | ICD-10-CM

## 2016-05-24 DIAGNOSIS — F419 Anxiety disorder, unspecified: Secondary | ICD-10-CM

## 2016-05-24 DIAGNOSIS — Z9484 Stem cells transplant status: Secondary | ICD-10-CM

## 2016-05-24 DIAGNOSIS — I10 Essential (primary) hypertension: Secondary | ICD-10-CM

## 2016-05-24 DIAGNOSIS — R5383 Other fatigue: Secondary | ICD-10-CM | POA: Diagnosis not present

## 2016-05-24 DIAGNOSIS — Z86711 Personal history of pulmonary embolism: Secondary | ICD-10-CM

## 2016-05-24 DIAGNOSIS — I493 Ventricular premature depolarization: Secondary | ICD-10-CM

## 2016-05-24 DIAGNOSIS — C9000 Multiple myeloma not having achieved remission: Secondary | ICD-10-CM

## 2016-05-24 DIAGNOSIS — K529 Noninfective gastroenteritis and colitis, unspecified: Secondary | ICD-10-CM

## 2016-05-24 DIAGNOSIS — Z7901 Long term (current) use of anticoagulants: Secondary | ICD-10-CM

## 2016-05-24 DIAGNOSIS — Z79899 Other long term (current) drug therapy: Secondary | ICD-10-CM

## 2016-05-24 DIAGNOSIS — F17210 Nicotine dependence, cigarettes, uncomplicated: Secondary | ICD-10-CM

## 2016-05-24 DIAGNOSIS — C9002 Multiple myeloma in relapse: Secondary | ICD-10-CM | POA: Diagnosis not present

## 2016-05-24 DIAGNOSIS — K219 Gastro-esophageal reflux disease without esophagitis: Secondary | ICD-10-CM

## 2016-05-24 DIAGNOSIS — Z5112 Encounter for antineoplastic immunotherapy: Secondary | ICD-10-CM | POA: Diagnosis not present

## 2016-05-24 DIAGNOSIS — E785 Hyperlipidemia, unspecified: Secondary | ICD-10-CM

## 2016-05-24 DIAGNOSIS — Z8673 Personal history of transient ischemic attack (TIA), and cerebral infarction without residual deficits: Secondary | ICD-10-CM

## 2016-05-24 DIAGNOSIS — I4891 Unspecified atrial fibrillation: Secondary | ICD-10-CM

## 2016-05-24 NOTE — Progress Notes (Signed)
Patient is here with his wife, he is doing well no complaints. He has a question about getting a flu shot.

## 2016-05-24 NOTE — Progress Notes (Signed)
[facility] day:  05/24/16  Chief Complaint: Logan Perez is a 70 y.o. 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  HPI: The patient was last seen in the medical oncology clinic on 04/26/2016.  At that time, he was seen prior to week #15 daratumumab.  He felt a little more fatigued.  He was scheduled to have cataract on 05/02/2016 and 05/16/2016.  M spike was 0.1 gm/dL.  [facility] was contacted regarding transplant plans.  Patient had labs on 05/23/2016 at [facility].  CBC revealed a hematocrit of 38.2, hemoglobin 12.9, MCV 104.3, WBC 5900 with an ANC of 4700.  CMP revealed a creatinine of 1.21, calcium 9.7, albumen 3.9, LDH 455 (338-610).  Notes indicate that he will be admitted for high dose melphalan on 06/20/2016.  He has CD 34 cells to infuse from 2008 collection.  He states that yesterday he had testing all day at [facility]. He had a bone marrow and pulmonary function tests. He is scheduled to have his pain pump filled before his transplant.   Symptomatically he is doing okay. He has chronic diarrhea for which he is on Imodium.  He denies any concerns.   Past Medical History:  Diagnosis Date  . Anxiety   . Atrial fibrillation ([place])   . BPH (benign prostatic hyperplasia)   . Complication of anesthesia    BAD HEADACHE NIGHT OF FIRST CATARACT  . Difficulty voiding   . Dysrhythmia    A FIB  . Elevated PSA   . GERD (gastroesophageal reflux disease)   . HLD (hyperlipidemia)   . HOH (hard of hearing)   . Hypertension   . Multiple myeloma ([place])   . Neuropathy ([place])   . Pain    BACK  . Palpitations   . Pneumonia   . Pulmonary embolism ([place])   . Stroke [facility])    TIA    Past Surgical History:  Procedure Laterality Date  . BACK SURGERY  1994  . CATARACT EXTRACTION W/PHACO Right 05/02/2016   Procedure: CATARACT EXTRACTION PHACO AND  INTRAOCULAR LENS PLACEMENT (IOC);  Surgeon: Birder Robson, MD;  Location: [facility] ORS;  Service: Ophthalmology;  Laterality: Right;  [place] 1.06 AP% 20.6 CDE 13.70 FLUID PACK LOT # [id number]  . CATARACT EXTRACTION W/PHACO Left 05/16/2016   Procedure: CATARACT EXTRACTION PHACO AND INTRAOCULAR LENS PLACEMENT (IOC);  Surgeon: Birder Robson, MD;  Location: [facility] ORS;  Service: Ophthalmology;  Laterality: Left;  [place] 01:43 AP% 19.8 CDE 20.45 FLUID PACK LOT #[id number] H  . KNEE ARTHROSCOPY Left 1992  . PAIN PUMP IMPLANTATION  2012  . stem cell implant  2008   [facility]    Family History  Problem Relation Age of Onset  . Cancer Father     throat  . Kidney disease Sister   . Stroke    . Bladder Cancer Neg Hx   . Prostate cancer Neg Hx     Social History:  reports that he has quit smoking. His smoking use included Cigarettes. He has a 30.00 pack-year smoking history. He has quit using smokeless tobacco. He reports that he does not drink alcohol. His drug history is not on file.  His wife's name is [name].  The patient is accompanied by his wife today.  Allergies:  Allergies  Allergen Reactions  . Azithromycin Diarrhea    Possible cause of C. Diff Possible cause of C. Diff  .  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 Rivaroxaban Rash    Current Medications: Current Outpatient Prescriptions  Medication Sig Dispense Refill  . acetaminophen (TYLENOL) 500 MG chewable tablet Chew 500 mg by mouth every 6 (six) hours as needed for pain.    [name] ALPRAZolam (XANAX) 0.25 MG tablet TAKE 1 TABLET BY MOUTH EVERY NIGHT AT BEDTIME AS NEEDED FOR ANXIETY. 30 tablet 3  . atorvastatin (LIPITOR) 10 MG tablet Take 10 mg by mouth daily.     . baclofen (LIORESAL) 10 MG tablet Take 1 tablet (10 mg total) by mouth at bedtime. 30 each 3  . calcium  citrate-vitamin D (CITRACAL+D) 315-200 MG-UNIT per tablet Take 1 tablet by mouth daily.     . cetirizine (ZYRTEC) 10 MG tablet Take 10 mg by mouth daily.     . Cholecalciferol (VITAMIN D3) 2000 UNITS capsule Take 2,000 Units by mouth daily.     . Cyanocobalamin (RA VITAMIN B-12 TR) 1000 MCG TBCR Take 1,000 mcg by mouth daily.     . Daratumumab (DARZALEX IV) Inject into the vein every 14 (fourteen) days.    [name] dexamethasone (DECADRON) 4 MG tablet TAKE 4 TABLETS BY MOUTH ON THE DAY OF TREATMENT AND 1 TABLET BY MOUTH ON THE DAY AFTER TREATMENT 20 tablet 0  . diltiazem (CARDIZEM) 60 MG tablet Take 60 mg by mouth as needed.    . diltiazem (DILACOR XR) 120 MG 24 hr capsule Take 120 mg by mouth daily.    [name] ELIQUIS 5 MG TABS tablet TAKE 1 TABLET(5 MG) BY MOUTH TWICE DAILY 60 tablet 3  . Iron-Vitamins (GERITOL PO) Take by mouth.    . loperamide (IMODIUM) 2 MG capsule Take 2 mg by mouth as needed for diarrhea or loose stools. Reported on 04/26/2016    . montelukast (SINGULAIR) 10 MG tablet TAKE 1 TABLET BY MOUTH ONCE A DAY AS DIRECTED. 30 tablet 0  . omeprazole (PRILOSEC) 20 MG capsule Take 20 mg by mouth daily.     [name] PAIN MANAGEMENT IT PUMP REFILL 1 each by Intrathecal route once. Medication: PF Fentanyl 1,500.0 mcg/ml PF Bupivicaine 30.0 mg/ml PF Clonidine 300.52mg/ml Total Volume: 40 ml Needed by 03-23-16 _0  1 each 0  . potassium chloride SA (K-DUR,KLOR-CON) 20 MEQ tablet TAKE 1 TABLET(20 MEQ) BY MOUTH TWICE DAILY 180 tablet 0  . ranitidine (ZANTAC) 150 MG tablet Take 150 mg by mouth daily.     . tamsulosin (FLOMAX) 0.4 MG CAPS capsule Take 1 capsule (0.4 mg total) by mouth daily. 90 capsule 4  . valACYclovir (VALTREX) 500 MG tablet TAKE 1 TABLET BY MOUTH DAILY 30 tablet 0  . fluticasone (FLONASE) 50 MCG/ACT nasal spray Place 1 spray into the nose daily as needed for allergies.      No current facility-administered medications for this visit.     Review of Systems:  GENERAL: Feels "ok".  No  fevers or sweats .  Weight down 2 pounds. PERFORMANCE STATUS (ECOG): 1 HEENT:  Cataracts s/p surgery.  No sore throat, mouth sores or tenderness. Lungs: No shortness of breath or cough.  No hemoptysis. Cardiac:  No chest pain, palpitations, orthopnea, or PND. GI:  Diarrhea (stable: 4-5 stools/day).  Appetite good.  No nausea, vomiting, diarrhea, constipation, melena or hematochezia. GU:  No urgency, frequency, dysuria, or hematuria. Musculoskeletal:  Compression fracture of back  on a pain pump (to be filled prior to transplant). No muscle tenderness. Extremities:  No pain or swelling. Skin:  Fragile skin.  No unexplained bruises.  No rashes or skin changes. Neuro:  Little tremor (chronic).  Neuropathy in feet.  No headache, weakness, balance or coordination issues. Endocrine:  No diabetes, thyroid issues, hot flashes or night sweats. Psych:  No mood changes, depression or anxiety. Pain:  Chronic back pain (pain well managed with pump). Review of systems:  All other systems reviewed and found to be negative.   Physical Exam: Blood pressure 137/87, pulse 86, temperature (!) 96.9 F (36.1 C), temperature source Tympanic, resp. rate 18, weight 169 lb 13.8 oz (77 kg). GENERAL:  Well developed, well nourished, gentleman sitting comfortably in the exam room in no acute distress. MENTAL STATUS:  Alert and oriented to person, place and time. HEAD:  Short gray hair.  Goatee.  Normocephalic, atraumatic, face symmetric, no Cushingoid features. EYES:  Glasses.  Blue eyes s/p cataract surgery.  Pupils equal round and reactive to light and accomodation.  No conjunctivitis or scleral icterus. ENT:  Hearing aide.  Oropharynx clear without lesion.  Hearing aide.  Tongue normal. Mucous membranes moist.  RESPIRATORY:  Clear to auscultation without rales, wheezes or rhonchi. CARDIOVASCULAR:  Regular rate and rhythm without murmur, rub or gallop. ABDOMEN:  RUQ pain pump.  Soft, non-tender, with active bowel  sounds, and no hepatosplenomegaly.  No masses. SKIN:  No rashes, ulcers or lesions. EXTREMITIES: No edema, no skin discoloration or tenderness.  No palpable cords. LYMPH NODES: No palpable cervical, supraclavicular, axillary or inguinal adenopathy  NEUROLOGICAL: Intention tremor. PSYCH:  Appropriate.   No visits with results within 3 Day(s) from this visit.  Latest known visit with results is:  Appointment on 05/10/2016  Component Date Value Ref Range Status  . WBC 05/10/2016 8.2  3.8 - 10.6 K/uL Final  . RBC 05/10/2016 3.71* 4.40 - 5.90 MIL/uL Final  . Hemoglobin 05/10/2016 13.0  13.0 - 18.0 g/dL Final  . HCT 05/10/2016 38.0* 40.0 - 52.0 % Final  . MCV 05/10/2016 102.5* 80.0 - 100.0 fL Final  . MCH 05/10/2016 35.1* 26.0 - 34.0 pg Final  . MCHC 05/10/2016 34.3  32.0 - 36.0 g/dL Final  . RDW 05/10/2016 14.5  11.5 - 14.5 % Final  . Platelets 05/10/2016 167  150 - 440 K/uL Final  . Neutrophils Relative % 05/10/2016 82  % Final  . Neutro Abs 05/10/2016 6.7* 1.4 - 6.5 K/uL Final  . Lymphocytes Relative 05/10/2016 7  % Final  . Lymphs Abs 05/10/2016 0.5* 1.0 - 3.6 K/uL Final  . Monocytes Relative 05/10/2016 10  % Final  . Monocytes Absolute 05/10/2016 0.8  0.2 - 1.0 K/uL Final  . Eosinophils Relative 05/10/2016 1  % Final  . Eosinophils Absolute 05/10/2016 0.1  0 - 0.7 K/uL Final  . Basophils Relative 05/10/2016 0  % Final  . Basophils Absolute 05/10/2016 0.0  0 - 0.1 K/uL Final  . Sodium 05/10/2016 139  135 - 145 mmol/L Final  . Potassium 05/10/2016 4.4  3.5 - 5.1 mmol/L Final  . Chloride 05/10/2016 107  101 - 111 mmol/L Final  . CO2 05/10/2016 27  22 - 32 mmol/L Final  . Glucose, Bld 05/10/2016 130* 65 - 99 mg/dL Final  . BUN 05/10/2016 13  6 - 20 mg/dL Final  . Creatinine, Ser 05/10/2016 1.18  0.61 - 1.24 mg/dL Final  . Calcium 05/10/2016 9.6  8.9 - 10.3 mg/dL  Final  . Total Protein 05/10/2016 6.2* 6.5 - 8.1 g/dL Final  . Albumin 05/10/2016 3.9  3.5 - 5.0 g/dL Final  . AST  05/10/2016 14* 15 - 41 U/L Final  . ALT 05/10/2016 15* 17 - 63 U/L Final  . Alkaline Phosphatase 05/10/2016 44  38 - 126 U/L Final  . Total Bilirubin 05/10/2016 0.6  0.3 - 1.2 mg/dL Final  . GFR calc non Af Amer 05/10/2016 >60  >60 mL/min Final  . GFR calc Af Amer 05/10/2016 >60  >60 mL/min Final   Comment: (NOTE) The eGFR has been calculated using the CKD EPI equation. This calculation has not been validated in all clinical situations. eGFR's persistently <60 mL/min signify possible Chronic Kidney Disease.   . Anion gap 05/10/2016 5  5 - 15 Final    Assessment:  Nitin Mckowen is a 70 y.o. male with stage III mutiple myeloma.  He initially presented with progressive back pain beginning in 12/2006.  MRI revealed "spots and compression fractures".  He began Velcade, thalidomide, and Decadron.  In 08/2007, he underwent high dose chemotherapy and autologous stem cell transplant.  He recurred with a rising M-spike (2.7) with repeat M spike (1.7 gm/dl) in 03/2010.  He was initially treated with Velcade (02/08/2010 - 05/10/2010).  He then began Revlimid (15 mg 3 weeks on/1 week off) and Decadron (40 mg on day 1, 8, 15, 22).  Because of significant side effect with Decadron his dose was decreased to 10 mg once a week in 07/2010.    He was on maintenance Revlimid. Revlimid was initially10 mg 3 weeks on/1 week off. This was changed to 10 mg 2 weeks on/2 weeks off secondary to right nipple tenderness. His dose was increased to 10 mg 3 weeks on/1 week off with Decadron 10 mg a week (on Sundays) and then Revlamid 15 mg 3 weeks on and 1 week off with Decadron on Sundays.  He began Pomalyst 4 mg 3 weeks on/1 week off with Decadron on 08/27/2015.  Over the past year his SPEP has revealed no monoclonal protein (04/21/2015) and 0.5 gm/dL on 09/22/2015 and 10/20/2015. M spike was 0.1 on 02/02/2016, 03/01/2016, 03/29/2016, and 04/26/2016.  Free light chains have been monitored. Kappa free light  chains were 18.54 on 11/21/2013, 18.37 on 02/20/2014, 18.93 on 05/22/2014, 32.58 (high; normal ratio 1.27) on 08/21/2014, 51.53 (high; elevated ratio of 2.12) on 11/13/2014, 28.08 (ratio 1.73) on 12/09/2014, 23.71 (ratio 2.17) on 01/18/2015, 92.93 (ratio 9.49) on 04/21/2015, 93.44 (ratio 10.28) on 05/26/2015, 255.45 (ratio of 24.05) on 07/14/2015, 373.89 (ratio 48.31) on 08/04/2015, 474.33 (ratio 70.58) on 08/25/2015, 450.76 (ratio 55.44) on 09/22/2015, 453.4 (ratio 59.89) on 10/20/2015, 58.26 (ratio of > 40.74) on 02/02/2016, 62.67 (ratio of 37.98) on 03/01/2016, 75.7 (ratio > 50.47) on 03/29/2016, and 79.7 (ratio 53.13) on 04/26/2016.  Bone survey on 12/08/2014 was stable.  Bone survey on 10/21/2015 revealed increase conspicuity of subcentimeter lytic lesions in the calvarium.  Bone marrow aspirate and biopsy on 11/04/2015 revealed an atypical monoclonal plasma cells estimated at 30-40% of marrow cells.   Marrow was variably cellular (approximately 45%) with background trilineage hematopoiesis. There was no significant increase in marrow reticulin fibers. Storage iron was present.    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 He develoed herpes zoster in 04/2008. He developed a pulmonary embolism in 05/2013. He was initially on Xarelto, but is now on Eliquis. He had an episode of pneumonia around this  time requiring a brief admission. He developed severe lower leg cramps on 08/07/2014 secondary to hypokalemia. Duplex was negative.   He was treated for C difficile colitis (Flagyl completed 07/30/2015).  He has a chronic indwelling pain pump.  He received 4 cycles of Pomalyst and Decadron (08/27/2015 - 11/19/2015).  Restaging studies document progressive disease.  Kappa free light chains are increasing.  SPEP revealed 0.5 gm/dL monoclonal protein then 1.3 gm/dL.  Bone survey reveals increase conspicuity of subcentimeter lytic lesions in the  calvarium.  Bone marrow reveals 30-40% plasma cells.   MUGA on 11/30/2015 revealed an ejection fraction of 46%.  He is felt not to be a good candidate for Kyprolis.  There were no focal wall motion abnormalities.  He had a stress echo less than 1 year ago.  He has a history of PVCs and atrial fibrillation.  He takes Cardizem prn.  He has met with the bone marrow transplant team at [facility] with the plan for salvage chemotherapy and consideration of a second autologous stem cell transplant.  He has frozen stem cells from his first transplant.  He is s/p 16 weeks of daratumumab (Darzalex) (12/09/2015 - 05/11/2016).  He is tolerating treatment well without side effect.  Bone marrow on 02/09/2016 revealed no diagnostic morphologic evidence of plasma cell myeloma.  Marrow was normocellular to hypocellular marrow for age (ranging from 10-40%) with maturing trilineage hematopoiesis and mild multilineage dyspoiesis.  There was patchy mild increase in reticulin.  Storage iron was present.  Flow cytometry revealed no definitive evidence of monoclonality.  There was a non-specific atypical myeloid and monocytic findings with no increase in blasts.  Cytogenetics were normal (46, XY).  Symptomatically, he feels good.  He is undergoing his transplant evaluation.  M spike is 0.1 gm/dL.  Plan:  1.  Labs today:  CBC with diff, CMP, Mg, SPEP, FLCA. 2.  Contact Jodi Geralds, transplant coordinator [phone]. 3.  RTC tomorrow in [place] for week #17 daratumumab. 4.  Patient to call for follow-up appt after transplant.   Lequita Asal, MD  05/24/2016, 10:42 AM

## 2016-05-25 ENCOUNTER — Inpatient Hospital Stay: Payer: Medicare Other

## 2016-05-25 VITALS — BP 134/86 | HR 76 | Temp 96.3°F | Resp 18

## 2016-05-25 DIAGNOSIS — C9002 Multiple myeloma in relapse: Secondary | ICD-10-CM

## 2016-05-25 DIAGNOSIS — Z5112 Encounter for antineoplastic immunotherapy: Secondary | ICD-10-CM | POA: Diagnosis not present

## 2016-05-25 LAB — PROTEIN ELECTROPHORESIS, SERUM
A/G Ratio: 1.5 (ref 0.7–1.7)
Albumin ELP: 3.4 g/dL (ref 2.9–4.4)
Alpha-1-Globulin: 0.2 g/dL (ref 0.0–0.4)
Alpha-2-Globulin: 0.8 g/dL (ref 0.4–1.0)
Beta Globulin: 1.1 g/dL (ref 0.7–1.3)
Gamma Globulin: 0.2 g/dL — ABNORMAL LOW (ref 0.4–1.8)
Globulin, Total: 2.2 g/dL (ref 2.2–3.9)
M-Spike, %: 0.1 g/dL — ABNORMAL HIGH
Total Protein ELP: 5.6 g/dL — ABNORMAL LOW (ref 6.0–8.5)

## 2016-05-25 LAB — KAPPA/LAMBDA LIGHT CHAINS
Kappa free light chain: 108.6 mg/L — ABNORMAL HIGH (ref 3.3–19.4)
Kappa, lambda light chain ratio: >72.4 — ABNORMAL HIGH (ref 0.26–1.65)
Lambda free light chains: <1.5 mg/L — ABNORMAL LOW (ref 5.7–26.3)

## 2016-05-25 MED ORDER — SODIUM CHLORIDE 0.9 % IV SOLN
Freq: Once | INTRAVENOUS | Status: AC
  Administered 2016-05-25: 10:00:00 via INTRAVENOUS
  Filled 2016-05-25: qty 4

## 2016-05-25 MED ORDER — DIPHENHYDRAMINE HCL 25 MG PO CAPS
50.0000 mg | ORAL_CAPSULE | Freq: Once | ORAL | Status: AC
  Administered 2016-05-25: 50 mg via ORAL
  Filled 2016-05-25: qty 2

## 2016-05-25 MED ORDER — SODIUM CHLORIDE 0.9 % IV SOLN
1200.0000 mg | Freq: Once | INTRAVENOUS | Status: AC
  Administered 2016-05-25: 1200 mg via INTRAVENOUS
  Filled 2016-05-25: qty 60

## 2016-05-25 MED ORDER — SODIUM CHLORIDE 0.9 % IV SOLN
Freq: Once | INTRAVENOUS | Status: AC
  Administered 2016-05-25: 09:00:00 via INTRAVENOUS
  Filled 2016-05-25: qty 1000

## 2016-05-25 MED ORDER — ACETAMINOPHEN 325 MG PO TABS
650.0000 mg | ORAL_TABLET | Freq: Once | ORAL | Status: AC
  Administered 2016-05-25: 650 mg via ORAL
  Filled 2016-05-25: qty 2

## 2016-05-26 ENCOUNTER — Telehealth: Payer: Self-pay | Admitting: Pain Medicine

## 2016-05-26 NOTE — Telephone Encounter (Signed)
Logan Perez has to go into hospital for couple weeks in Sept. Changed his pump refill date to 06-13-16 as he is not sure how soon he will be able to get out for appts after hospital stay. Please note in Pump Refill log about this appt date change. He was scheduled for 9-26

## 2016-06-02 ENCOUNTER — Other Ambulatory Visit: Payer: Self-pay

## 2016-06-02 MED ORDER — PAIN MANAGEMENT IT PUMP REFILL: 1.0000 | Freq: Once | INTRATHECAL | 0 refills | Status: DC

## 2016-06-07 ENCOUNTER — Telehealth: Payer: Self-pay

## 2016-06-07 NOTE — Telephone Encounter (Signed)
Attempting to call patient to reschedule pump refill due to the compounding company being closed on 06-12-16 and unable to get the medicine here on 06-13-16.  When attempting to call patient numerous times, the phone will not ring or do anything.  Tried on several different phones to no avail.  Will continue to try to reach patient.

## 2016-06-13 ENCOUNTER — Encounter: Payer: Medicare Other | Admitting: Pain Medicine

## 2016-06-15 ENCOUNTER — Encounter: Payer: Self-pay | Admitting: Pain Medicine

## 2016-06-15 ENCOUNTER — Ambulatory Visit: Payer: Medicare Other | Attending: Pain Medicine | Admitting: Pain Medicine

## 2016-06-15 VITALS — BP 140/75 | HR 92 | Temp 98.3°F | Resp 18 | Ht 67.0 in | Wt 168.0 lb

## 2016-06-15 DIAGNOSIS — S22080S Wedge compression fracture of T11-T12 vertebra, sequela: Secondary | ICD-10-CM

## 2016-06-15 DIAGNOSIS — I10 Essential (primary) hypertension: Secondary | ICD-10-CM | POA: Insufficient documentation

## 2016-06-15 DIAGNOSIS — Z86711 Personal history of pulmonary embolism: Secondary | ICD-10-CM | POA: Insufficient documentation

## 2016-06-15 DIAGNOSIS — B967 Clostridium perfringens [C. perfringens] as the cause of diseases classified elsewhere: Secondary | ICD-10-CM | POA: Insufficient documentation

## 2016-06-15 DIAGNOSIS — Z7901 Long term (current) use of anticoagulants: Secondary | ICD-10-CM | POA: Diagnosis not present

## 2016-06-15 DIAGNOSIS — C9002 Multiple myeloma in relapse: Secondary | ICD-10-CM | POA: Diagnosis not present

## 2016-06-15 DIAGNOSIS — M545 Low back pain: Secondary | ICD-10-CM | POA: Diagnosis present

## 2016-06-15 DIAGNOSIS — C419 Malignant neoplasm of bone and articular cartilage, unspecified: Secondary | ICD-10-CM | POA: Diagnosis not present

## 2016-06-15 DIAGNOSIS — Z79891 Long term (current) use of opiate analgesic: Secondary | ICD-10-CM | POA: Insufficient documentation

## 2016-06-15 DIAGNOSIS — M62830 Muscle spasm of back: Secondary | ICD-10-CM | POA: Insufficient documentation

## 2016-06-15 DIAGNOSIS — M5416 Radiculopathy, lumbar region: Secondary | ICD-10-CM

## 2016-06-15 DIAGNOSIS — I480 Paroxysmal atrial fibrillation: Secondary | ICD-10-CM | POA: Diagnosis not present

## 2016-06-15 DIAGNOSIS — I081 Rheumatic disorders of both mitral and tricuspid valves: Secondary | ICD-10-CM | POA: Insufficient documentation

## 2016-06-15 DIAGNOSIS — N401 Enlarged prostate with lower urinary tract symptoms: Secondary | ICD-10-CM | POA: Insufficient documentation

## 2016-06-15 DIAGNOSIS — Z9689 Presence of other specified functional implants: Secondary | ICD-10-CM | POA: Diagnosis not present

## 2016-06-15 DIAGNOSIS — Q828 Other specified congenital malformations of skin: Secondary | ICD-10-CM | POA: Insufficient documentation

## 2016-06-15 DIAGNOSIS — M4726 Other spondylosis with radiculopathy, lumbar region: Secondary | ICD-10-CM | POA: Diagnosis not present

## 2016-06-15 DIAGNOSIS — N138 Other obstructive and reflux uropathy: Secondary | ICD-10-CM | POA: Diagnosis not present

## 2016-06-15 DIAGNOSIS — Z978 Presence of other specified devices: Secondary | ICD-10-CM

## 2016-06-15 DIAGNOSIS — M4854XS Collapsed vertebra, not elsewhere classified, thoracic region, sequela of fracture: Secondary | ICD-10-CM | POA: Insufficient documentation

## 2016-06-15 DIAGNOSIS — K219 Gastro-esophageal reflux disease without esophagitis: Secondary | ICD-10-CM | POA: Diagnosis not present

## 2016-06-15 DIAGNOSIS — G893 Neoplasm related pain (acute) (chronic): Secondary | ICD-10-CM | POA: Diagnosis not present

## 2016-06-15 DIAGNOSIS — E782 Mixed hyperlipidemia: Secondary | ICD-10-CM | POA: Diagnosis not present

## 2016-06-15 DIAGNOSIS — G8929 Other chronic pain: Secondary | ICD-10-CM | POA: Diagnosis not present

## 2016-06-15 DIAGNOSIS — Z462 Encounter for fitting and adjustment of other devices related to nervous system and special senses: Secondary | ICD-10-CM

## 2016-06-15 DIAGNOSIS — Z95828 Presence of other vascular implants and grafts: Secondary | ICD-10-CM

## 2016-06-15 DIAGNOSIS — Z451 Encounter for adjustment and management of infusion pump: Secondary | ICD-10-CM

## 2016-06-15 NOTE — Progress Notes (Signed)
Safety precautions to be maintained throughout the outpatient stay will include: orient to surroundings, keep bed in low position, maintain call bell within reach at all times, provide assistance with transfer out of bed and ambulation.  Patient is being admitted today at [facility] for stem cell transplant.  Expected stay will be 2-3 weeks.

## 2016-06-15 NOTE — Progress Notes (Signed)
Patient's Name: Logan Perez  MRN: [id number]  Referring Provider: Sherrin Daisy, MD  DOB: [birth year]  PCP: Sherrin Daisy, MD  DOS: 06/15/2016  Note by: Kathlen Brunswick. Dossie Arbour, MD  Service setting: Ambulatory outpatient  Location: [facility] (AMB) Pain Management Facility  Visit type: Procedure  Specialty: Interventional Pain Management  Patient type: Established   Primary Reason for Visit: Intrathecal Pump Management CC: Back Pain (lower)   Procedure:  Intrathecal Drug Delivery System (IDDS):  Type: [place] Refill [phone]) No rate change Region: Abdominal Laterality: Right  Type of Pump: Medtronic Synchromed II (MRI-compatible) Delivery Route: Intrathecal Type of Pain Treated: Neuropathic/Nociceptive Primary Medication Class: Opioid/opiate  Medication, Concentration, Infusion Program, & Delivery Rate: Please see scanned programming printout.   Indications: 1. Cancer associated pain   2. Multiple myeloma in relapse ([place])   3. Chronic low back pain   4. Chronic lumbar radicular pain   5. Compression fracture of T12 vertebra, sequela   6. Malignant neoplasm of bone, unspecified location ([place])   7. Encounter for adjustment or management of infusion pump   8. Encounter for interrogation of infusion pump   9. Long term current use of opiate analgesic   10. Presence of intrathecal pump   11. Presence of implanted infusion pump (Medtronic, programmable, intrathecal pump)    Pre-procedure Pain Score: 2/10 Post-procedure Pain Score: 2 /10  Pre-Procedure Assessment:  Logan Perez is a 70 y.o. year old, male patient, seen today for interventional treatment. He has BPH with obstruction/lower urinary tract symptoms; Multiple myeloma ([place]); Diarrhea; Clostridium difficile diarrhea; Essential (primary) hypertension; Acid reflux; Combined fat and carbohydrate induced hyperlipemia; MI (mitral incompetence); TI (tricuspid incompetence); Paroxysmal atrial fibrillation ([place]); Pulmonary embolism  ([place]); Breathlessness on exertion; Chronic pain; Presence of intrathecal pump; Long term current use of opiate analgesic; Long term prescription opiate use; Opiate use; Encounter for therapeutic drug level monitoring; Night muscle spasms; Muscle spasticity; Neuropathic pain; Neurogenic pain; Chronic low back pain; Lumbar spondylosis; Compression fracture of T12 vertebra (HCC) (70-75% magnitude) (with mild retropulsion); Diffuse myofascial pain syndrome; Presence of implanted infusion pump (Medtronic, programmable, intrathecal pump); Cancer associated pain; Encounter for interrogation of infusion pump; Chronic lumbar radicular pain; Opiate analgesic contract exists; Chronic pain syndrome; Chronic anticoagulation (Eliquis); Hypomagnesemia; Ejection fraction < 50%; Encounter for adjustment or management of infusion pump; Bone cancer ([place]); and Porokeratosis on his problem list.. His primarily concern today is the Back Pain (lower)  The patient had a PICC line implanted yesterday on the right. He's pending to undergo stem cell therapy at [facility]. We'll receive chemotherapy for his bone marrow cancer and subsequently he will undergo bone marrow transplant.  Pain Type: Chronic pain Pain Location: Back Pain Orientation: Lower Pain Descriptors / Indicators: Dull Pain Frequency: Constant  Date of Last Visit: 03/23/16 Service Provided on Last Visit: Procedure (pump refill)  Coagulation Parameters Lab Results  Component Value Date   INR 0.97 02/09/2016   LABPROT 13.1 02/09/2016   APTT 25 02/09/2016   PLT 167 05/10/2016    Verification of the correct person, correct site (including marking of site), and correct procedure were performed and confirmed by the patient.  Consent: Secured. Under the influence of no sedatives a written informed consent was obtained, after having provided information on the risks and possible complications. To fulfill our ethical and legal obligations, as recommended by the  American Medical Association's Code of Ethics, we have provided information to the patient about our clinical impression; the nature and purpose of the treatment or procedure;  the risks, benefits, and possible complications of the intervention; alternatives; the risk(s) and benefit(s) of the alternative treatment(s) or procedure(s); and the risk(s) and benefit(s) of doing nothing. The patient was provided information about the risks and possible complications associated with the procedure. These include, but are not limited to, failure to achieve desired goals, infection, bleeding, organ or nerve damage, allergic reactions, paralysis, and death. In addition, the patient was informed that Medicine is not an exact science; therefore, there is also the possibility of unforeseen risks and possible complications that may result in a catastrophic outcome. The patient indicated having understood very clearly. We have given the patient no guarantees and we have made no promises. Enough time was given to the patient to ask questions, all of which were answered to the patient's satisfaction.  Consent Attestation: I, the ordering provider, attest that I have discussed with the patient the benefits, risks, side-effects, alternatives, likelihood of achieving goals, and potential problems during recovery for the procedure that I have provided informed consent.  Pre-Procedure Preparation: Safety Precautions: Allergies reviewed. Appropriate site, procedure, and patient were confirmed by following the Joint Commission's Universal Protocol (UP.01.01.01), in the form of a "Time Out". The patient was asked to confirm marked site and procedure, before commencing. The patient was asked about blood thinners, or active infections, both of which were denied. Patient was assessed for positional comfort and all pressure points were checked before starting procedure. Infection Control Precautions: Sterile technique used. Standard  Universal Precautions were taken as recommended by the Department of [facility] for Disease Control and Prevention (CDC). Standard pre-surgical skin prep was conducted. Respiratory hygiene and cough etiquette was practiced. Hand hygiene observed. Safe injection practices and needle disposal techniques followed. SDV (single dose vial) medications used. Medications properly checked for expiration dates and contaminants. Personal protective equipment (PPE) used: Surgical mask. Sterile Radiation-resistant gloves. Monitoring:  As per clinic protocol. Vitals:   06/15/16 0902  BP: 140/75  Pulse: 92  Resp: 18  Temp: 98.3 F (36.8 C)  SpO2: 95%  Weight: 168 lb (76.2 kg)  Height: _0  (1.702 m)  Calculated BMI: Body mass index is 26.31 kg/m. Allergies: He is allergic to azithromycin; zometa [zoledronic acid]; and rivaroxaban.. Allergy Precautions: None required  Description of Procedure Process:   Time-out: "Time-out" completed before starting procedure, as per protocol. Position: Supine Target Area: Central-port of intrathecal pump. Approach: Anterior, 90 degree angle approach. Area Prepped: Entire Area around the pump implant. Prepping solution: ChloraPrep (2% chlorhexidine gluconate and 70% isopropyl alcohol) Safety Precautions: Aspiration looking for blood return was conducted prior to all injections. At no point did we inject any substances, as a needle was being advanced. No attempts were made at seeking any paresthesias. Safe injection practices and needle disposal techniques used. Medications properly checked for expiration dates. SDV (single dose vial) medications used. Description of the Procedure: Protocol guidelines were followed. Two nurses trained to do implant refills were present during the entire procedure. The refill medication was checked by both healthcare providers as well as the patient. The patient was included in the "Time-out" to verify the medication. The patient  was placed in position. The pump was identified. The area was prepped in the usual manner. The sterile template was positioned over the pump, making sure the side-port location matched that of the pump. Both, the pump and the template were held for stability. The needle provided in the Medtronic Kit was then introduced thru the center of the template and into  the central port. The pump content was aspirated and discarded volume documented. The new medication was slowly infused into the pump, thru the filter, making sure to avoid overpressure of the device. The needle was then removed and the area cleansed, making sure to leave some of the prepping solution back to take advantage of its long term bactericidal properties. The pump was interrogated and programmed to reflect the correct medication, volume, and dosage. The program was printed and taken to the physician for approval. Once checked and signed by the physician, a copy was provided to the patient and another scanned into the EMR. EBL: None Materials Used: Medtronic Refill Kit  Imaging Guidance:  Type of Imaging Technique: None required.  Antibiotic Prophylaxis:  Indication(s): No indications identified. Type:  Antibiotics Given (last 72 hours)    None       Post-operative Assessment:  Complications: No immediate post-treatment complications were observed. Disposition: The patient tolerated the entire procedure well. No post-procedural neurological changes observed. The patient was discharged home, once institutional criteria were met. The patient was provided with post-procedure discharge instructions, including a section on how to identify potential problems. Should any problems arise concerning this procedure, the patient was given instructions to immediately contact us, at any time, without hesitation. Comments:  No additional relevant information.  No orders of the defined types were placed in this encounter.  Medications  administered during this visit: Mr. Lezotte had no medications administered during this visit.  Prescriptions ordered during this visit: No orders of the defined types were placed in this encounter.   Requested PM Follow-up: No Follow-up on file.  Future Appointments Date Time Provider [place]  09/19/2016 11:00 AM Milinda Pointer, MD [facility] None  05/01/2017 8:30 AM Nori Riis, PA-C [facility] None    Primary Care Physician: Sherrin Daisy, MD Location: [facility] Note by: Kathlen Brunswick. Dossie Arbour, M.D, DABA, DABAPM, DABPM, DABIPP, FIPP  Disclaimer:  Medicine is not an exact science. The only guarantee in medicine is that nothing is guaranteed. It is important to note that the decision to proceed with this intervention was based on the information collected from the patient. The Data and conclusions were drawn from the patient's questionnaire, the interview, and the physical examination. Because the information was provided in large part by the patient, it cannot be guaranteed that it has not been purposely or unconsciously manipulated. Every effort has been made to obtain as much relevant data as possible for this evaluation. It is important to note that the conclusions that lead to this procedure are derived in large part from the available data. Always take into account that the treatment will also be dependent on availability of resources and existing treatment guidelines, considered by other Pain Management Practitioners as being common knowledge and practice, at the time of the intervention. For Medico-Legal purposes, it is also important to point out that variation in procedural techniques and pharmacological choices are the acceptable norm. The indications, contraindications, technique, and results of the above procedure should only be interpreted and judged by a Board-Certified Interventional Pain Specialist with extensive familiarity and  expertise in the same exact procedure and technique. Attempts at providing opinions without similar or greater experience and expertise than that of the treating physician will be considered as inappropriate and unethical, and shall result in a formal complaint to the state medical board and applicable specialty societies. Pain Score: We use the NRS-11 scale. This is a self-reported, subjective measurement of pain severity  with only modest accuracy. It is used primarily to identify changes within a particular patient. It must be understood that outpatient pain scales are significantly less accurate that those used for research, where they can be applied under ideal controlled circumstances with minimal exposure to variables. In reality, the score is likely to be a combination of pain intensity and pain affect, where pain affect describes the degree of emotional arousal or changes in action readiness caused by the sensory experience of pain. Factors such as social and work situation, setting, emotional state, anxiety levels, expectation, and prior pain experience may influence pain perception and show large inter-individual differences that may also be affected by time variables.

## 2016-06-15 NOTE — Patient Instructions (Signed)
Narcotic Overdose °A narcotic overdose is the misuse or overuse of a narcotic drug. A narcotic overdose can make you pass out and stop breathing. If you are not treated right away, this can cause permanent brain damage or stop your heart. Medicine may be given to reverse the effects of an overdose. If so, this medicine may bring on withdrawal symptoms. The symptoms may be abdominal cramps, throwing up (vomiting), sweating, chills, and nervousness. °Injecting narcotics can cause more problems than just an overdose. AIDS, hepatitis, and other very serious infections are transmitted by sharing needles and syringes. If you decide to quit using, there are medicines which can help you through the withdrawal period. Trying to quit all at once on your own can be uncomfortable, but not life-threatening. Call your caregiver, Narcotics Anonymous, or any drug and alcohol treatment program for further help.  °  °This information is not intended to replace advice given to you by your health care provider. Make sure you discuss any questions you have with your health care provider. °  °Document Released: 11/02/2004 Document Revised: 10/16/2014 Document Reviewed: 03/17/2015 °Elsevier Interactive Patient Education ©2016 Elsevier Inc. ° °

## 2016-06-19 ENCOUNTER — Other Ambulatory Visit: Payer: Self-pay | Admitting: *Deleted

## 2016-06-19 MED ORDER — ALPRAZOLAM 0.25 MG PO TABS: ORAL_TABLET | ORAL | 3 refills | Status: DC

## 2016-06-23 MED FILL — Medication: INTRATHECAL | Qty: 1 | Status: AC

## 2016-06-26 ENCOUNTER — Telehealth: Payer: Self-pay | Admitting: *Deleted

## 2016-06-26 NOTE — Telephone Encounter (Signed)
Spoke with [facility] re: patient and pain pump and the fact that he exhibiting an altered mental status.  Patient has been admitted to [facility] for Stem Cell transplant.  Told John in order to put a plan in place to manage the pump he would need to place a pain management consult.  They do have patient's current print out of last fill.    Contact John  [phone]

## 2016-06-27 ENCOUNTER — Other Ambulatory Visit: Payer: Self-pay | Admitting: *Deleted

## 2016-06-27 NOTE — Telephone Encounter (Signed)
Written 9/11, was at reception for pick up. Will fax to pharmacy

## 2016-07-04 ENCOUNTER — Encounter: Payer: Medicare Other | Admitting: Pain Medicine

## 2016-07-10 DIAGNOSIS — Z9484 Stem cells transplant status: Secondary | ICD-10-CM | POA: Insufficient documentation

## 2016-07-10 DIAGNOSIS — I4891 Unspecified atrial fibrillation: Secondary | ICD-10-CM | POA: Insufficient documentation

## 2016-07-10 DIAGNOSIS — T865 Complications of stem cell transplant: Secondary | ICD-10-CM | POA: Insufficient documentation

## 2016-07-17 ENCOUNTER — Encounter: Payer: Self-pay | Admitting: Hematology and Oncology

## 2016-07-18 ENCOUNTER — Other Ambulatory Visit: Payer: Self-pay | Admitting: Hematology and Oncology

## 2016-08-03 IMAGING — CT CT BIOPSY
2 series · 13 of 32 positions shown, 19 images · non-contrast
Comparison: none

INDICATION: 69-year-old male with a history of relapsed multiple myeloma.

[Series 2: routine osteo · axial · 0.73mm/px · z∈[-218,-188]mm · 3 of 31 slices shown]
[im 4/31  soft-tissue]
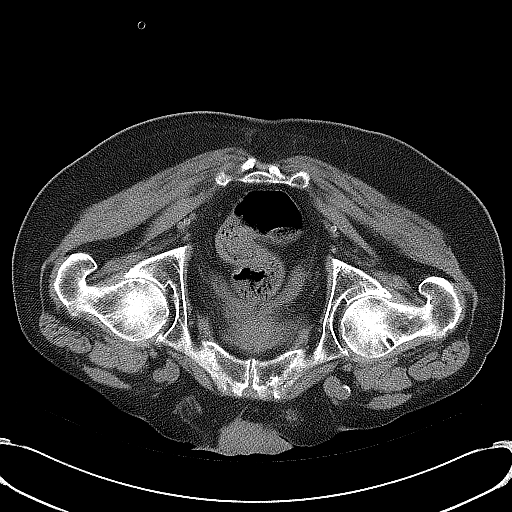
[im 7/31  soft-tissue]
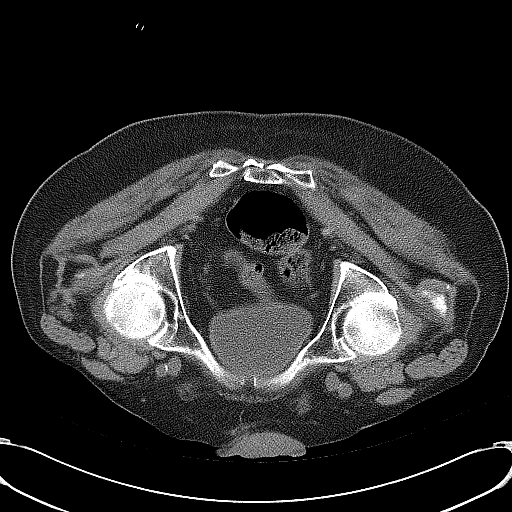
[im 10/31  soft-tissue]
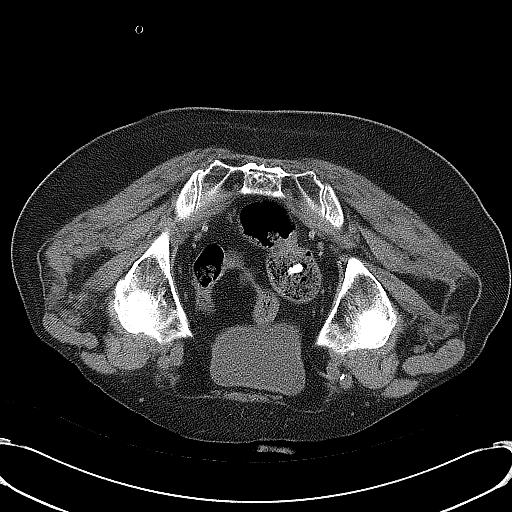

[Series 3: osteo bx 2.4 b70s · axial · 0.74mm/px · z∈[-160,-149]mm · 10 of 18 slices shown, 16 images]
[im 2/18  soft-tissue]
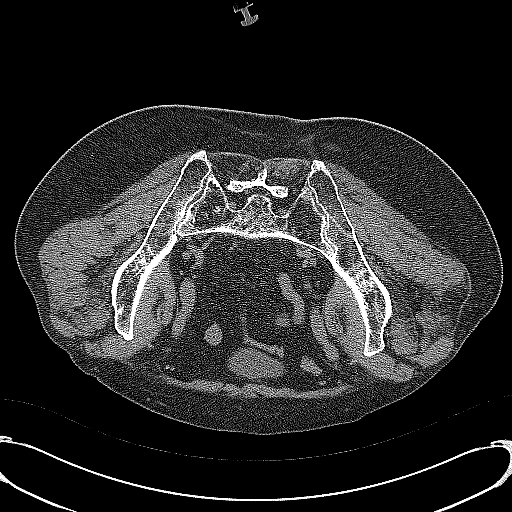
[im 2/18  bone]
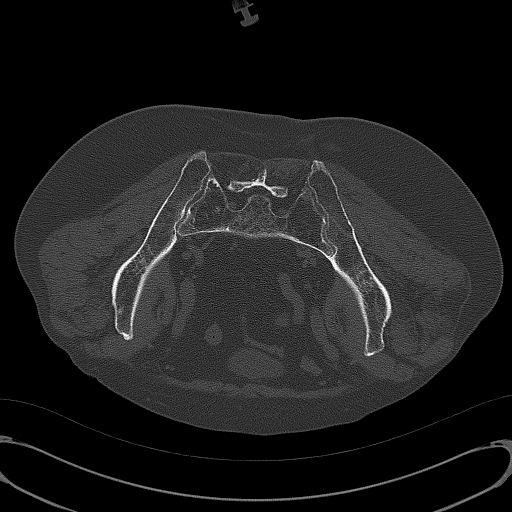
[im 4/18  soft-tissue]
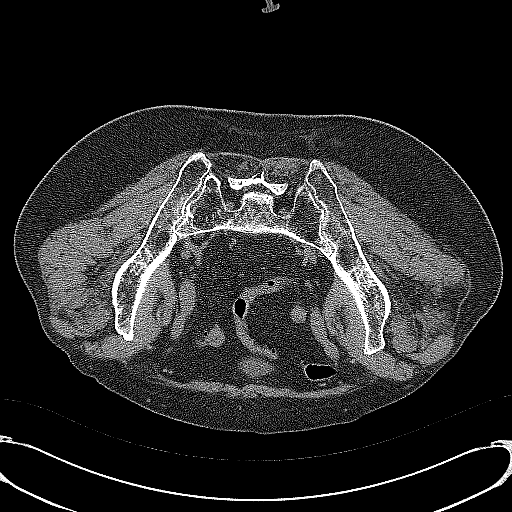
[im 5/18  soft-tissue]
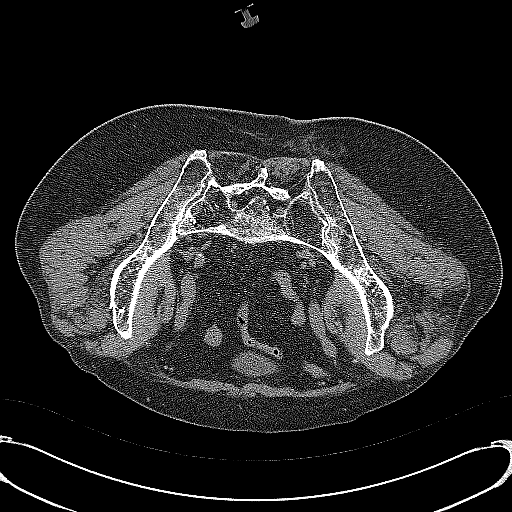
[im 7/18  soft-tissue]
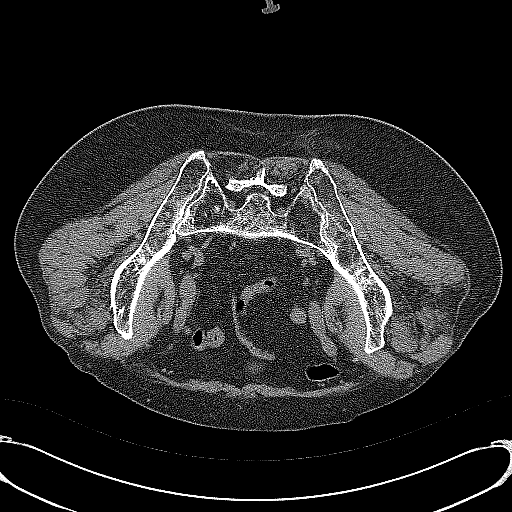
[im 8/18  soft-tissue]
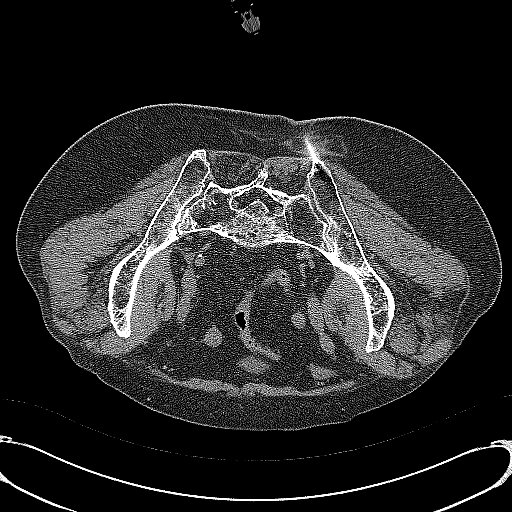
[im 10/18  soft-tissue]
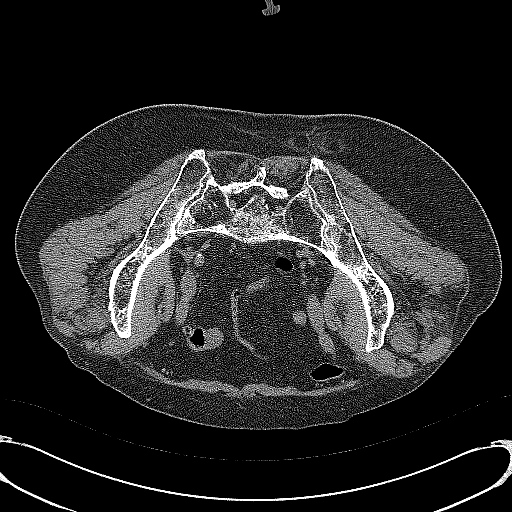
[im 11/18  soft-tissue]
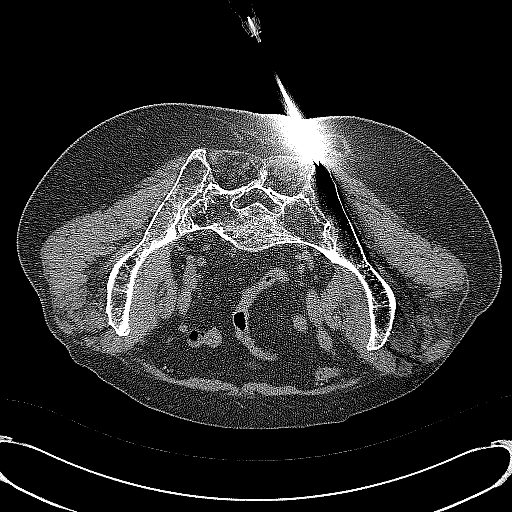
[im 11/18  lung]
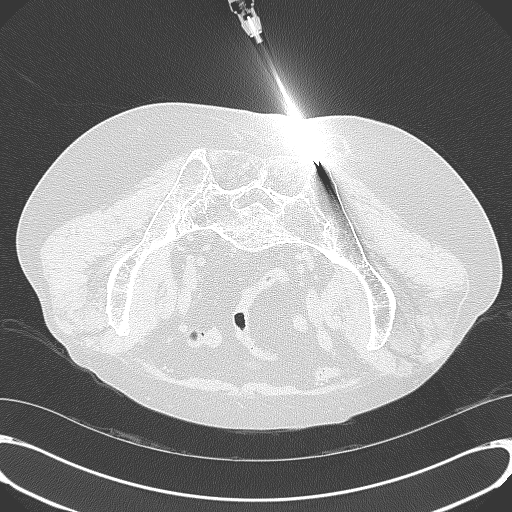
[im 13/18  soft-tissue]
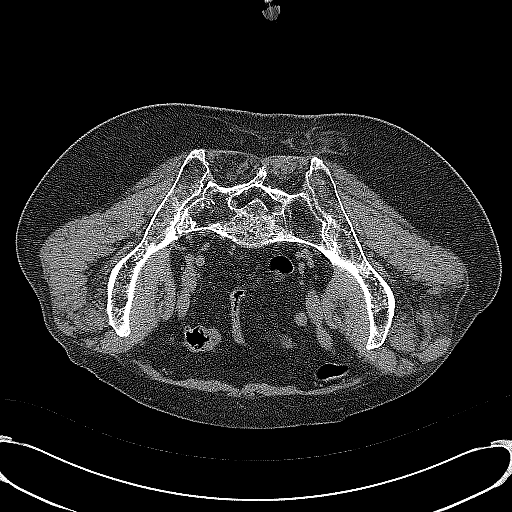
[im 13/18  lung]
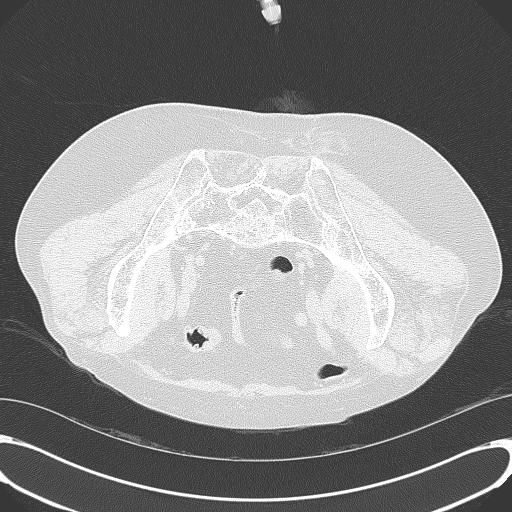
[im 14/18  soft-tissue]
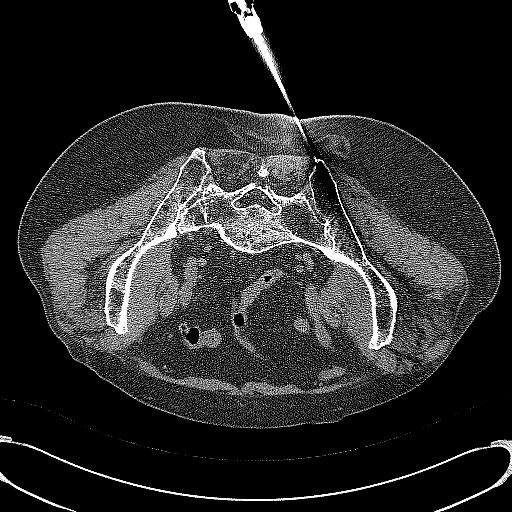
[im 14/18  lung]
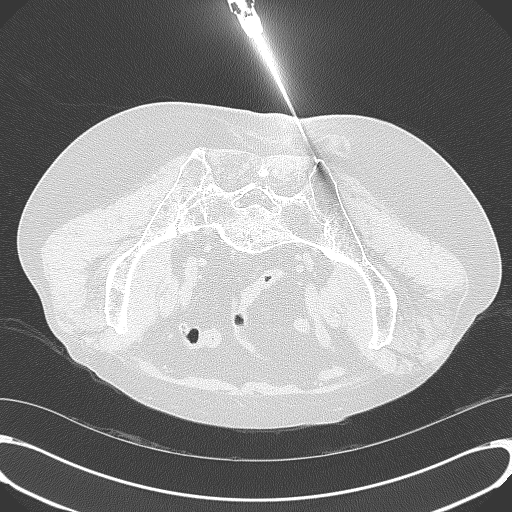
[im 14/18  bone]
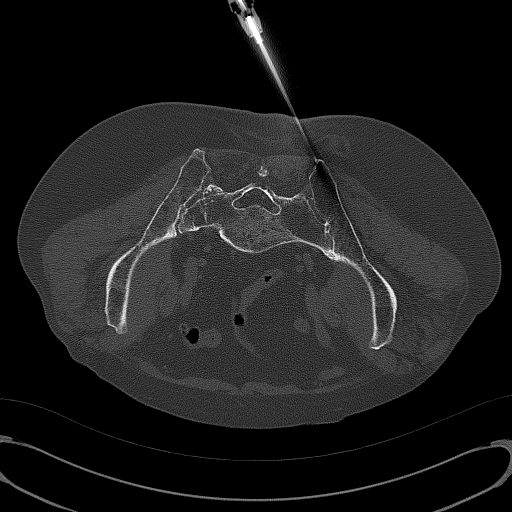
[im 16/18  soft-tissue]
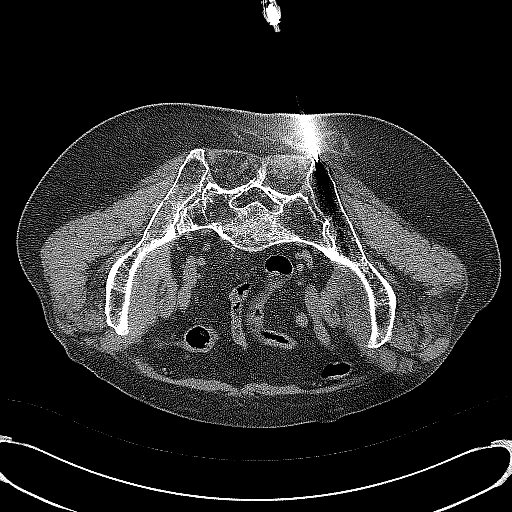
[im 16/18  lung]
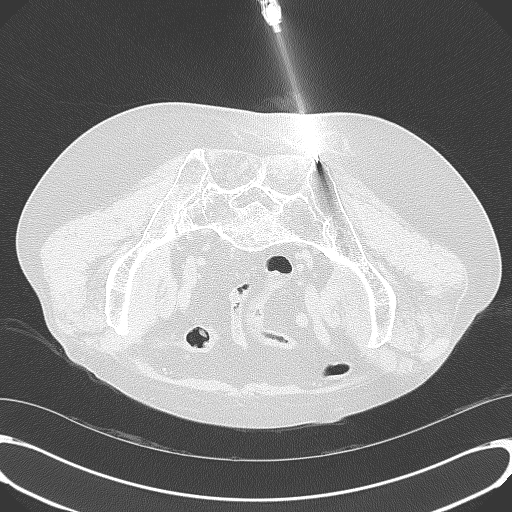

[13 of 32 positions shown; findings below may reference images not displayed]

EXAM:
CT BIOPSY

MEDICATIONS:
None.

ANESTHESIA/SEDATION:
Moderate (conscious) sedation was employed during this procedure. A
total of Versed 3.0 mg and Fentanyl 100 mcg was administered
intravenously.

Moderate Sedation Time: 17 minutes. The patient's level of
consciousness and vital signs were monitored continuously by
radiology nursing throughout the procedure under my direct
supervision.

FLUOROSCOPY TIME:  CT

COMPLICATIONS:
None

PROCEDURE:
The procedure risks, benefits, and alternatives were explained to
the patient. Questions regarding the procedure were encouraged and
answered. The patient understands and consents to the procedure.

Scout CT of the pelvis was performed for surgical planning purposes.

The posterior pelvis was prepped with Betadinein a sterile fashion,
and a sterile drape was applied covering the operative field. A
sterile gown and sterile gloves were used for the procedure. Local
anesthesia was provided with 1% Lidocaine.

We targeted the right posterior iliac bone for biopsy. The skin and
subcutaneous tissues were infiltrated with 1% lidocaine without
epinephrine. A small stab incision was made with an 11 blade
scalpel, and an 11 gauge Razii needle was advanced with CT guidance
to the posterior cortex. Manual forced was used to advance the
needle through the posterior cortex and the stylet was removed. A
bone marrow aspirate was retrieved and passed to a cytotechnologist
in the room. The Razii needle was then advanced without the stylet
for a core biopsy. The core biopsy was retrieved and also passed to
a cytotechnologist.

Manual pressure was used for hemostasis and a sterile dressing was
placed.

No complications were encountered no significant blood loss was
encountered.

Patient tolerated the procedure well and remained hemodynamically
stable throughout.
IMPRESSION: Status post CT-guided bone marrow biopsy, with tissue specimen sent
to pathology for complete histopathologic analysis

## 2016-08-15 ENCOUNTER — Other Ambulatory Visit: Payer: Medicare Other

## 2016-08-16 ENCOUNTER — Inpatient Hospital Stay: Payer: Medicare Other | Attending: Hematology and Oncology | Admitting: Hematology and Oncology

## 2016-08-16 ENCOUNTER — Other Ambulatory Visit: Payer: Self-pay | Admitting: *Deleted

## 2016-08-16 ENCOUNTER — Inpatient Hospital Stay: Payer: Medicare Other

## 2016-08-16 VITALS — BP 125/81 | HR 106 | Wt 143.1 lb

## 2016-08-16 DIAGNOSIS — Z9225 Personal history of immunosupression therapy: Secondary | ICD-10-CM | POA: Diagnosis not present

## 2016-08-16 DIAGNOSIS — Z87891 Personal history of nicotine dependence: Secondary | ICD-10-CM

## 2016-08-16 DIAGNOSIS — E785 Hyperlipidemia, unspecified: Secondary | ICD-10-CM

## 2016-08-16 DIAGNOSIS — I4891 Unspecified atrial fibrillation: Secondary | ICD-10-CM

## 2016-08-16 DIAGNOSIS — M549 Dorsalgia, unspecified: Secondary | ICD-10-CM

## 2016-08-16 DIAGNOSIS — R531 Weakness: Secondary | ICD-10-CM | POA: Diagnosis not present

## 2016-08-16 DIAGNOSIS — I493 Ventricular premature depolarization: Secondary | ICD-10-CM | POA: Diagnosis not present

## 2016-08-16 DIAGNOSIS — R634 Abnormal weight loss: Secondary | ICD-10-CM | POA: Diagnosis not present

## 2016-08-16 DIAGNOSIS — Z86711 Personal history of pulmonary embolism: Secondary | ICD-10-CM

## 2016-08-16 DIAGNOSIS — C9002 Multiple myeloma in relapse: Secondary | ICD-10-CM

## 2016-08-16 DIAGNOSIS — C9000 Multiple myeloma not having achieved remission: Secondary | ICD-10-CM

## 2016-08-16 DIAGNOSIS — D539 Nutritional anemia, unspecified: Secondary | ICD-10-CM | POA: Diagnosis not present

## 2016-08-16 DIAGNOSIS — D61818 Other pancytopenia: Secondary | ICD-10-CM | POA: Diagnosis not present

## 2016-08-16 DIAGNOSIS — K117 Disturbances of salivary secretion: Secondary | ICD-10-CM | POA: Insufficient documentation

## 2016-08-16 DIAGNOSIS — R5383 Other fatigue: Secondary | ICD-10-CM | POA: Diagnosis not present

## 2016-08-16 DIAGNOSIS — F419 Anxiety disorder, unspecified: Secondary | ICD-10-CM

## 2016-08-16 DIAGNOSIS — Z7901 Long term (current) use of anticoagulants: Secondary | ICD-10-CM | POA: Diagnosis not present

## 2016-08-16 DIAGNOSIS — Z8673 Personal history of transient ischemic attack (TIA), and cerebral infarction without residual deficits: Secondary | ICD-10-CM

## 2016-08-16 DIAGNOSIS — Z79899 Other long term (current) drug therapy: Secondary | ICD-10-CM

## 2016-08-16 DIAGNOSIS — Z9484 Stem cells transplant status: Secondary | ICD-10-CM | POA: Diagnosis not present

## 2016-08-16 DIAGNOSIS — D649 Anemia, unspecified: Secondary | ICD-10-CM

## 2016-08-16 DIAGNOSIS — E440 Moderate protein-calorie malnutrition: Secondary | ICD-10-CM

## 2016-08-16 DIAGNOSIS — R110 Nausea: Secondary | ICD-10-CM

## 2016-08-16 DIAGNOSIS — I10 Essential (primary) hypertension: Secondary | ICD-10-CM | POA: Diagnosis not present

## 2016-08-16 DIAGNOSIS — E876 Hypokalemia: Secondary | ICD-10-CM

## 2016-08-16 DIAGNOSIS — N400 Enlarged prostate without lower urinary tract symptoms: Secondary | ICD-10-CM | POA: Diagnosis not present

## 2016-08-16 DIAGNOSIS — G8929 Other chronic pain: Secondary | ICD-10-CM | POA: Diagnosis not present

## 2016-08-16 DIAGNOSIS — K219 Gastro-esophageal reflux disease without esophagitis: Secondary | ICD-10-CM

## 2016-08-16 DIAGNOSIS — D638 Anemia in other chronic diseases classified elsewhere: Secondary | ICD-10-CM | POA: Diagnosis not present

## 2016-08-16 DIAGNOSIS — D696 Thrombocytopenia, unspecified: Secondary | ICD-10-CM

## 2016-08-16 DIAGNOSIS — Z8739 Personal history of other diseases of the musculoskeletal system and connective tissue: Secondary | ICD-10-CM

## 2016-08-16 LAB — COMPREHENSIVE METABOLIC PANEL
ALT: 13 U/L — ABNORMAL LOW (ref 17–63)
AST: 20 U/L (ref 15–41)
Albumin: 2.6 g/dL — ABNORMAL LOW (ref 3.5–5.0)
Alkaline Phosphatase: 132 U/L — ABNORMAL HIGH (ref 38–126)
Anion gap: 8 (ref 5–15)
BUN: 11 mg/dL (ref 6–20)
CO2: 24 mmol/L (ref 22–32)
Calcium: 8.3 mg/dL — ABNORMAL LOW (ref 8.9–10.3)
Chloride: 106 mmol/L (ref 101–111)
Creatinine, Ser: 1.07 mg/dL (ref 0.61–1.24)
GFR calc Af Amer: >60 mL/min (ref >60)
GFR calc non Af Amer: >60 mL/min (ref >60)
Glucose, Bld: 89 mg/dL (ref 65–99)
Potassium: 4.5 mmol/L (ref 3.5–5.1)
Sodium: 138 mmol/L (ref 135–145)
Total Bilirubin: 0.5 mg/dL (ref 0.3–1.2)
Total Protein: 5.4 g/dL — ABNORMAL LOW (ref 6.5–8.1)

## 2016-08-16 LAB — CBC WITH DIFFERENTIAL/PLATELET
Basophils Absolute: 0.1 10*3/uL (ref 0–0.1)
Basophils Relative: 1 %
Eosinophils Absolute: 0.4 10*3/uL (ref 0–0.7)
Eosinophils Relative: 4 %
HCT: 27.2 % — ABNORMAL LOW (ref 40.0–52.0)
Hemoglobin: 8.9 g/dL — ABNORMAL LOW (ref 13.0–18.0)
Lymphocytes Relative: 10 %
Lymphs Abs: 1.1 10*3/uL (ref 1.0–3.6)
MCH: 32.2 pg (ref 26.0–34.0)
MCHC: 32.6 g/dL (ref 32.0–36.0)
MCV: 98.7 fL (ref 80.0–100.0)
Monocytes Absolute: 1.1 10*3/uL — ABNORMAL HIGH (ref 0.2–1.0)
Monocytes Relative: 11 %
Neutro Abs: 7.7 10*3/uL — ABNORMAL HIGH (ref 1.4–6.5)
Neutrophils Relative %: 74 %
Platelets: 64 10*3/uL — ABNORMAL LOW (ref 150–440)
RBC: 2.76 MIL/uL — ABNORMAL LOW (ref 4.40–5.90)
RDW: 22.4 % — ABNORMAL HIGH (ref 11.5–14.5)
WBC: 10.4 10*3/uL (ref 3.8–10.6)

## 2016-08-16 NOTE — Progress Notes (Addendum)
[facility] day:  08/16/16  Chief Complaint: Logan Perez is a 70 y.o. 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  HPI: The patient was last seen in the medical oncology clinic on 05/24/2016.  At that time, he was seen prior to week #17 daratumumab.  He was undergoing testing at [facility] for planned autologous transplant.  He received is last cycle of daratumumab on 05/25/2016.  Preparative regimen consisted of melphalan on 06/15/2016 followed by autologous stem cell transplant on 06/16/2016 (day 0).  He received approximately 8 x 10 ^6 cells/kg.  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  He also experienced atrial fibrillation with intermittent episodes of RVR requiring IV beta blockers.  Following a prolonged hospitalization he was in [facility] in [place] for a brief period of time.  His wife took him out of the facility after 3 days secondary to his care there.  He was last seen in the transplant clinic at [facility] on 08/10/2016.  At that time, he was day 44 s/p second autologous transplant.  He had mild pancytopenia with a platelet count in the 60,000 range.  He denies any bruising or bleeding.  He is on prophylactic valacyclovir for 1 year post transplant.  He completed prophylactic fluconazole and Levaquin (completed on engraftment).  He completes Dapsone on day 60.  Symptomatically, he notes fatigue.  He has lost 26 pounds.  He states that he can't eat meat or poultry.  He has occasional nausea.  He has no significant diarrhea.  He describes 6-7 weeks of diarrhea while hospitalized.   Past Medical History:  Diagnosis Date  . Anxiety   . Atrial fibrillation ([place])   . BPH (benign prostatic hyperplasia)   . Complication of anesthesia    BAD HEADACHE NIGHT OF FIRST CATARACT  .  Difficulty voiding   . Dysrhythmia    A FIB  . Elevated PSA   . GERD (gastroesophageal reflux disease)   . HLD (hyperlipidemia)   . HOH (hard of hearing)   . Hypertension   . Multiple myeloma ([place])   . Neuropathy ([place])   . Pain    BACK  . Palpitations   . Pneumonia   . Pulmonary embolism ([place])   . Stroke [facility])    TIA    Past Surgical History:  Procedure Laterality Date  . BACK SURGERY  1994  . CATARACT EXTRACTION W/PHACO Right 05/02/2016   Procedure: CATARACT EXTRACTION PHACO AND INTRAOCULAR LENS PLACEMENT (IOC);  Surgeon: Birder Robson, MD;  Location: [facility] ORS;  Service: Ophthalmology;  Laterality: Right;  [place] 1.06 AP% 20.6 CDE 13.70 FLUID PACK LOT # [id number]  . CATARACT EXTRACTION W/PHACO Left 05/16/2016   Procedure: CATARACT EXTRACTION PHACO AND INTRAOCULAR LENS PLACEMENT (IOC);  Surgeon: Birder Robson, MD;  Location: [facility] ORS;  Service: Ophthalmology;  Laterality: Left;  [place] 01:43 AP% 19.8 CDE 20.45 FLUID PACK LOT #[id number] H  . KNEE ARTHROSCOPY Left 1992  . PAIN PUMP IMPLANTATION  2012  . stem cell implant  2008   [facility]    Family History  Problem Relation Age of Onset  . Cancer Father     throat  . Kidney disease Sister   . Stroke    . Bladder Cancer Neg Hx   . Prostate cancer Neg Hx     Social History:  reports  that he has quit smoking. His smoking use included Cigarettes. He has a 30.00 pack-year smoking history. He has quit using smokeless tobacco. He reports that he does not drink alcohol. His drug history is not on file.  His wife's name is [name].  The patient is accompanied by his wife today.  Allergies:  Allergies  Allergen Reactions  . Azithromycin Diarrhea    Possible cause of C. Diff Possible cause of C. Diff Possible cause of C. Diff Possible cause of C. Diff Possible cause of C. Diff  . 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 Rivaroxaban Rash    Current Medications: Current Outpatient Prescriptions  Medication Sig Dispense Refill  . acetaminophen (TYLENOL) 500 MG tablet Take 1,000 mg by mouth.    . ALPRAZolam (XANAX) 0.25 MG tablet TAKE 1 TABLET BY MOUTH EVERY NIGHT AT BEDTIME AS NEEDED FOR ANXIETY. 30 tablet 3  . atorvastatin (LIPITOR) 10 MG tablet Take 10 mg by mouth daily.     . baclofen (LIORESAL) 10 MG tablet Take 1 tablet (10 mg total) by mouth at bedtime. 30 each 3  . calcium citrate-vitamin D (CITRACAL+D) 315-200 MG-UNIT per tablet Take 1 tablet by mouth daily.     . cetirizine (ZYRTEC) 10 MG tablet Take 10 mg by mouth daily.     . Cholecalciferol (VITAMIN D3) 2000 UNITS capsule Take 2,000 Units by mouth daily.     . Cyanocobalamin (RA VITAMIN B-12 TR) 1000 MCG TBCR Take 1,000 mcg by mouth daily.     [name] diltiazem (CARDIZEM) 60 MG tablet Take 60 mg by mouth as needed.    . diltiazem (DILACOR XR) 120 MG 24 hr capsule Take 120 mg by mouth daily.    [name] ELIQUIS 5 MG TABS tablet TAKE 1 TABLET(5 MG) BY MOUTH TWICE DAILY 60 tablet 0  . Iron-Vitamins (GERITOL PO) Take by mouth.    . loperamide (IMODIUM) 2 MG capsule Take 2 mg by mouth as needed for diarrhea or loose stools. Reported on 04/26/2016    . magnesium oxide (MAG-OX) 400 MG tablet Take 400 mg by mouth.    [name] omeprazole (PRILOSEC) 20 MG capsule Take 20 mg by mouth daily.     [name] PAIN MANAGEMENT IT PUMP REFILL 1 each by Intrathecal route once. Medication: PF Fentanyl 1,500.0 mcg/ml PF Bupivicaine 30.0 mg/ml PF Clonidine 300.17mg/ml Total Volume: 40 ml Needed by 06-13-16 @ 1100 1 each 0  . potassium chloride SA (K-DUR,KLOR-CON) 20 MEQ tablet TAKE 1 TABLET(20 MEQ) BY MOUTH TWICE DAILY 180 tablet 0  . ranitidine (ZANTAC) 150 MG tablet Take 150 mg by mouth daily.     . tamsulosin (FLOMAX) 0.4 MG CAPS capsule Take 1 capsule (0.4 mg total) by mouth daily. 90 capsule 4  . valACYclovir (VALTREX) 500 MG  tablet TAKE 1 TABLET BY MOUTH DAILY 30 tablet 0  . dapsone 100 MG tablet     . Daratumumab (DARZALEX IV) Inject 1,200 mg into the vein every 30 (thirty) days.     .Marland Kitchendexamethasone (DECADRON) 4 MG tablet TAKE 4 TABLETS BY MOUTH ON THE DAY OF TREATMENT AND 1 TABLET BY MOUTH ON THE DAY AFTER TREATMENT (Patient not taking: Reported on 08/16/2016) 20 tablet 0  . fluticasone (FLONASE) 50 MCG/ACT nasal spray Place 1 spray into the nose daily as needed for allergies.     .[name]  montelukast (SINGULAIR) 10 MG tablet TAKE 1 TABLET BY MOUTH ONCE A DAY AS DIRECTED. (Patient not taking: Reported on 08/16/2016) 30 tablet 0   No current facility-administered medications for this visit.     Review of Systems:  GENERAL: Fatigue.  No fevers or sweats.  Weight down 26 pounds since transplant (gained 2 1/2 pounds since home). PERFORMANCE STATUS (ECOG): 1 HEENT:  Cataracts s/p surgery.  Taste buds "shot".  No sore throat, mouth sores or tenderness. Lungs: No shortness of breath or cough.  No hemoptysis. Cardiac:  No chest pain, palpitations, orthopnea, or PND. GI:  Diarrhea for 6-7 weeks while hospitalized.  Appetite good.  Nausea managed with Compazine.  No vomiting, diarrhea, constipation, melena or hematochezia. GU:  No urgency, frequency, dysuria, or hematuria. Musculoskeletal:  Compression fracture of back on a pain pump. No muscle tenderness. Extremities:  No pain or swelling. Skin:  Fragile skin.  No unexplained bruises.  No rashes or skin changes. Neuro:  Little tremor (chronic).  Neuropathy in feet.  No headache, weakness, balance or coordination issues. Endocrine:  No diabetes, thyroid issues, hot flashes or night sweats. Psych:  No mood changes, depression or anxiety. Pain:  Chronic back pain (pain well managed with pump). Review of systems:  All other systems reviewed and found to be negative.   Physical Exam: Blood pressure 92/64, pulse (!) 125, weight 143 lb 2 oz (64.9 kg). GENERAL:  Chronically ill  appearing frail gentleman sitting comfortably in a wheelchair the exam room in no acute distress. MENTAL STATUS:  Alert and oriented to person, place and time. HEAD:  Wearing a gray cap.  Temporal wasting.  Normocephalic, atraumatic, face symmetric, no Cushingoid features. EYES:  Glasses.  Blue eyes s/p cataract surgery.  Pupils equal round and reactive to light and accomodation.  No conjunctivitis or scleral icterus. ENT:  Hearing aide.  Oropharynx clear without lesion.  Hearing aide.  Tongue normal. Mucous membranes moist.  RESPIRATORY:  Clear to auscultation without rales, wheezes or rhonchi. CARDIOVASCULAR:  Regular rate and rhythm without murmur, rub or gallop. ABDOMEN:  RUQ pain pump.  Soft, non-tender, with active bowel sounds, and no hepatosplenomegaly.  No masses. SKIN:  No rashes, ulcers or lesions. EXTREMITIES: No edema, no skin discoloration or tenderness.  No palpable cords. LYMPH NODES: No palpable cervical, supraclavicular, axillary or inguinal adenopathy  NEUROLOGICAL: Intention tremor. PSYCH:  Appropriate.    Appointment on 08/16/2016  Component Date Value Ref Range Status  . WBC 08/16/2016 10.4  3.8 - 10.6 K/uL Final  . RBC 08/16/2016 2.76* 4.40 - 5.90 MIL/uL Final  . Hemoglobin 08/16/2016 8.9* 13.0 - 18.0 g/dL Final  . HCT 08/16/2016 27.2* 40.0 - 52.0 % Final  . MCV 08/16/2016 98.7  80.0 - 100.0 fL Final  . MCH 08/16/2016 32.2  26.0 - 34.0 pg Final  . MCHC 08/16/2016 32.6  32.0 - 36.0 g/dL Final  . RDW 08/16/2016 22.4* 11.5 - 14.5 % Final  . Platelets 08/16/2016 64* 150 - 440 K/uL Final  . Neutrophils Relative % 08/16/2016 74  % Final  . Neutro Abs 08/16/2016 7.7* 1.4 - 6.5 K/uL Final  . Lymphocytes Relative 08/16/2016 10  % Final  . Lymphs Abs 08/16/2016 1.1  1.0 - 3.6 K/uL Final  . Monocytes Relative 08/16/2016 11  % Final  . Monocytes Absolute 08/16/2016 1.1* 0.2 - 1.0 K/uL Final  . Eosinophils Relative 08/16/2016 4  % Final  . Eosinophils Absolute 08/16/2016  0.4  0 - 0.7 K/uL Final  .  Basophils Relative 08/16/2016 1  % Final  . Basophils Absolute 08/16/2016 0.1  0 - 0.1 K/uL Final  . Sodium 08/16/2016 138  135 - 145 mmol/L Final  . Potassium 08/16/2016 4.5  3.5 - 5.1 mmol/L Final  . Chloride 08/16/2016 106  101 - 111 mmol/L Final  . CO2 08/16/2016 24  22 - 32 mmol/L Final  . Glucose, Bld 08/16/2016 89  65 - 99 mg/dL Final  . BUN 08/16/2016 11  6 - 20 mg/dL Final  . Creatinine, Ser 08/16/2016 1.07  0.61 - 1.24 mg/dL Final  . Calcium 08/16/2016 8.3* 8.9 - 10.3 mg/dL Final  . Total Protein 08/16/2016 5.4* 6.5 - 8.1 g/dL Final  . Albumin 08/16/2016 2.6* 3.5 - 5.0 g/dL Final  . AST 08/16/2016 20  15 - 41 U/L Final  . ALT 08/16/2016 13* 17 - 63 U/L Final  . Alkaline Phosphatase 08/16/2016 132* 38 - 126 U/L Final  . Total Bilirubin 08/16/2016 0.5  0.3 - 1.2 mg/dL Final  . GFR calc non Af Amer 08/16/2016 >60  >60 mL/min Final  . GFR calc Af Amer 08/16/2016 >60  >60 mL/min Final   Comment: (NOTE) The eGFR has been calculated using the CKD EPI equation. This calculation has not been validated in all clinical situations. eGFR's persistently <60 mL/min signify possible Chronic Kidney Disease.   . Anion gap 08/16/2016 8  5 - 15 Final    Assessment:  Logan Perez is a 70 y.o. male with stage III mutiple myeloma.  He initially presented with progressive back pain beginning in 12/2006.  MRI revealed "spots and compression fractures".  He began Velcade, thalidomide, and Decadron.  In 08/2007, he underwent high dose chemotherapy and autologous stem cell transplant.  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  He recurred with a rising M-spike (2.7) with repeat M spike (1.7 gm/dl) in 03/2010.  He was initially treated with Velcade (02/08/2010 - 05/10/2010).  He then began Revlimid (15 mg 3 weeks on/1 week off) and Decadron (40 mg on day 1, 8, 15, 22).  Because of significant side effect with Decadron his dose was decreased to 10  mg once a week in 07/2010.    He was on maintenance Revlimid. Revlimid was initially10 mg 3 weeks on/1 week off. This was changed to 10 mg 2 weeks on/2 weeks off secondary to right nipple tenderness. His dose was increased to 10 mg 3 weeks on/1 week off with Decadron 10 mg a week (on Sundays) and then Revlamid 15 mg 3 weeks on and 1 week off with Decadron on Sundays.  He began Pomalyst 4 mg 3 weeks on/1 week off with Decadron on 08/27/2015.  Over the past year his SPEP has revealed no monoclonal protein (04/21/2015) and 0.5 gm/dL on 09/22/2015 and 10/20/2015. M spike was 0.1 on 02/02/2016, 03/01/2016, 03/29/2016, 04/26/2016, and 05/24/2016.  Free light chains have been monitored. Kappa free light chains were 18.54 on 11/21/2013, 18.37 on 02/20/2014, 18.93 on 05/22/2014, 32.58 (high; normal ratio 1.27) on 08/21/2014, 51.53 (high; elevated ratio of 2.12) on 11/13/2014, 28.08 (ratio 1.73) on 12/09/2014, 23.71 (ratio 2.17) on 01/18/2015, 92.93 (ratio 9.49) on 04/21/2015, 93.44 (ratio 10.28) on 05/26/2015, 255.45 (ratio of 24.05) on 07/14/2015, 373.89 (ratio 48.31) on 08/04/2015, 474.33 (ratio 70.58) on 08/25/2015, 450.76 (ratio 55.44) on 09/22/2015, 453.4 (ratio 59.89) on 10/20/2015, 58.26 (ratio of > 40.74) on 02/02/2016, 62.67 (ratio of 37.98) on 03/01/2016, 75.7 (ratio > 50.47) on 03/29/2016, 79.7 (ratio 53.13) on 04/26/2016, and  108.6 (ratio > 72.4) on 05/24/2016.  Bone survey on 12/08/2014 was stable.  Bone survey on 10/21/2015 revealed increase conspicuity of subcentimeter lytic lesions in the calvarium.  Bone marrow aspirate and biopsy on 11/04/2015 revealed an atypical monoclonal plasma cells estimated at 30-40% of marrow cells.   Marrow was variably cellular (approximately 45%) with background trilineage hematopoiesis. There was no significant increase in marrow reticulin fibers. Storage iron was present.    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 He develoed herpes zoster in 04/2008. He developed a pulmonary embolism in 05/2013. He was initially on Xarelto, but is now on Eliquis. He had an episode of pneumonia around this time requiring a brief admission. He developed severe lower leg cramps on 08/07/2014 secondary to hypokalemia. Duplex was negative.   He was treated for C difficile colitis (Flagyl completed 07/30/2015).  He has a chronic indwelling pain pump.  He received 4 cycles of Pomalyst and Decadron (08/27/2015 - 11/19/2015).  Restaging studies document progressive disease.  Kappa free light chains are increasing.  SPEP revealed 0.5 gm/dL monoclonal protein then 1.3 gm/dL.  Bone survey reveals increase conspicuity of subcentimeter lytic lesions in the calvarium.  Bone marrow reveals 30-40% plasma cells.   MUGA on 11/30/2015 revealed an ejection fraction of 46%.  He is felt not to be a good candidate for Kyprolis.  There were no focal wall motion abnormalities.  He had a stress echo less than 1 year ago.  He has a history of PVCs and atrial fibrillation.  He takes Cardizem prn.  He is s/p 17 weeks of daratumumab (Darzalex) (12/09/2015 - 05/25/2016).  He tolerated treatment well without side effect.  Bone marrow on 02/09/2016 revealed no diagnostic morphologic evidence of plasma cell myeloma.  Marrow was normocellular to hypocellular marrow for age (ranging from 10-40%) with maturing trilineage hematopoiesis and mild multilineage dyspoiesis.  There was patchy mild increase in reticulin.  Storage iron was present.  Flow cytometry revealed no definitive evidence of monoclonality.  There was a non-specific atypical myeloid and monocytic findings with no increase in blasts.  Cytogenetics were normal (46, XY).  He is currently day 56 s/p 2nd autologous stem cell transplant on 06/16/2016.  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  He also experienced atrial fibrillation with  intermittent episodes of RVR requiring IV beta blockers.  Symptomatically, he notes fatigue.  He has lost 26 pounds.  He has occasional nausea.  He has no significant diarrhea.    Plan:  1.  Labs today:  CBC with diff, CMP. 2.  Review entire transplant course.  Discuss current medical issues.  Discuss initial follow-up appointments every week then decreasing frequency of visits as he improves.  Discuss the importance of caloric intake. 3.  Continue prophylactic valacyclovir for 1 year post transplant.   4.  Continue Dapsone until day 60. 5.  Obtain copy of transplant records and recommendations. 6.  RTC in 1 week for MD assessment and labs (CBC with diff, CMP, Mg).   Lequita Asal, MD  08/16/2016, 11:37 AM

## 2016-08-16 NOTE — Progress Notes (Signed)
Patient had stem cell transplant in September.  Doing well.  Appetite @ 50% now.  Using Ensure +.  Patient is still weak. No complaints of pain. BP decreased today.  92/64  HR 125.  Recheck 79/60  HR 108.  Tried to take orthostatic BP - would not register on dynamap.

## 2016-08-20 ENCOUNTER — Encounter: Payer: Self-pay | Admitting: Hematology and Oncology

## 2016-08-20 DIAGNOSIS — D539 Nutritional anemia, unspecified: Secondary | ICD-10-CM | POA: Insufficient documentation

## 2016-08-20 DIAGNOSIS — D649 Anemia, unspecified: Secondary | ICD-10-CM

## 2016-08-20 DIAGNOSIS — E440 Moderate protein-calorie malnutrition: Secondary | ICD-10-CM | POA: Insufficient documentation

## 2016-08-20 DIAGNOSIS — D696 Thrombocytopenia, unspecified: Secondary | ICD-10-CM | POA: Insufficient documentation

## 2016-08-20 DIAGNOSIS — R110 Nausea: Secondary | ICD-10-CM | POA: Insufficient documentation

## 2016-08-23 ENCOUNTER — Inpatient Hospital Stay: Payer: Medicare Other

## 2016-08-23 ENCOUNTER — Telehealth: Payer: Self-pay | Admitting: *Deleted

## 2016-08-23 ENCOUNTER — Encounter: Payer: Self-pay | Admitting: Hematology and Oncology

## 2016-08-23 ENCOUNTER — Inpatient Hospital Stay (HOSPITAL_BASED_OUTPATIENT_CLINIC_OR_DEPARTMENT_OTHER): Payer: Medicare Other | Admitting: Hematology and Oncology

## 2016-08-23 VITALS — BP 88/61 | HR 123 | Temp 95.8°F | Resp 18 | Wt 141.5 lb

## 2016-08-23 DIAGNOSIS — C9002 Multiple myeloma in relapse: Secondary | ICD-10-CM | POA: Diagnosis not present

## 2016-08-23 DIAGNOSIS — R5383 Other fatigue: Secondary | ICD-10-CM

## 2016-08-23 DIAGNOSIS — D649 Anemia, unspecified: Secondary | ICD-10-CM

## 2016-08-23 DIAGNOSIS — Z9484 Stem cells transplant status: Secondary | ICD-10-CM | POA: Diagnosis not present

## 2016-08-23 DIAGNOSIS — Z7901 Long term (current) use of anticoagulants: Secondary | ICD-10-CM

## 2016-08-23 DIAGNOSIS — K219 Gastro-esophageal reflux disease without esophagitis: Secondary | ICD-10-CM

## 2016-08-23 DIAGNOSIS — E876 Hypokalemia: Secondary | ICD-10-CM | POA: Diagnosis not present

## 2016-08-23 DIAGNOSIS — Z79899 Other long term (current) drug therapy: Secondary | ICD-10-CM

## 2016-08-23 DIAGNOSIS — C9000 Multiple myeloma not having achieved remission: Secondary | ICD-10-CM

## 2016-08-23 DIAGNOSIS — G8929 Other chronic pain: Secondary | ICD-10-CM

## 2016-08-23 DIAGNOSIS — R110 Nausea: Secondary | ICD-10-CM

## 2016-08-23 DIAGNOSIS — I4891 Unspecified atrial fibrillation: Secondary | ICD-10-CM

## 2016-08-23 DIAGNOSIS — E785 Hyperlipidemia, unspecified: Secondary | ICD-10-CM

## 2016-08-23 DIAGNOSIS — Z86711 Personal history of pulmonary embolism: Secondary | ICD-10-CM

## 2016-08-23 DIAGNOSIS — Z8739 Personal history of other diseases of the musculoskeletal system and connective tissue: Secondary | ICD-10-CM

## 2016-08-23 DIAGNOSIS — D539 Nutritional anemia, unspecified: Secondary | ICD-10-CM

## 2016-08-23 DIAGNOSIS — R682 Dry mouth, unspecified: Secondary | ICD-10-CM

## 2016-08-23 DIAGNOSIS — R197 Diarrhea, unspecified: Secondary | ICD-10-CM

## 2016-08-23 DIAGNOSIS — R634 Abnormal weight loss: Secondary | ICD-10-CM

## 2016-08-23 DIAGNOSIS — F419 Anxiety disorder, unspecified: Secondary | ICD-10-CM

## 2016-08-23 DIAGNOSIS — D696 Thrombocytopenia, unspecified: Secondary | ICD-10-CM

## 2016-08-23 DIAGNOSIS — M549 Dorsalgia, unspecified: Secondary | ICD-10-CM

## 2016-08-23 DIAGNOSIS — K117 Disturbances of salivary secretion: Secondary | ICD-10-CM | POA: Insufficient documentation

## 2016-08-23 DIAGNOSIS — E440 Moderate protein-calorie malnutrition: Secondary | ICD-10-CM

## 2016-08-23 DIAGNOSIS — Z8673 Personal history of transient ischemic attack (TIA), and cerebral infarction without residual deficits: Secondary | ICD-10-CM

## 2016-08-23 DIAGNOSIS — N400 Enlarged prostate without lower urinary tract symptoms: Secondary | ICD-10-CM

## 2016-08-23 DIAGNOSIS — I10 Essential (primary) hypertension: Secondary | ICD-10-CM

## 2016-08-23 LAB — COMPREHENSIVE METABOLIC PANEL
ALT: 12 U/L — ABNORMAL LOW (ref 17–63)
AST: 18 U/L (ref 15–41)
Albumin: 2.6 g/dL — ABNORMAL LOW (ref 3.5–5.0)
Alkaline Phosphatase: 108 U/L (ref 38–126)
Anion gap: 8 (ref 5–15)
BUN: 13 mg/dL (ref 6–20)
CO2: 20 mmol/L — ABNORMAL LOW (ref 22–32)
Calcium: 7.9 mg/dL — ABNORMAL LOW (ref 8.9–10.3)
Chloride: 108 mmol/L (ref 101–111)
Creatinine, Ser: 1.08 mg/dL (ref 0.61–1.24)
GFR calc Af Amer: >60 mL/min (ref >60)
GFR calc non Af Amer: >60 mL/min (ref >60)
Glucose, Bld: 113 mg/dL — ABNORMAL HIGH (ref 65–99)
Potassium: 3.9 mmol/L (ref 3.5–5.1)
Sodium: 136 mmol/L (ref 135–145)
Total Bilirubin: 0.6 mg/dL (ref 0.3–1.2)
Total Protein: 5.5 g/dL — ABNORMAL LOW (ref 6.5–8.1)

## 2016-08-23 LAB — IRON AND TIBC
Iron: 79 ug/dL (ref 45–182)
Saturation Ratios: 56 % — ABNORMAL HIGH (ref 17.9–39.5)
TIBC: 141 ug/dL — ABNORMAL LOW (ref 250–450)
UIBC: 62 ug/dL

## 2016-08-23 LAB — CBC WITH DIFFERENTIAL/PLATELET
Basophils Absolute: 0.1 10*3/uL (ref 0–0.1)
Basophils Relative: 1 %
Eosinophils Absolute: 0.1 10*3/uL (ref 0–0.7)
Eosinophils Relative: 2 %
HCT: 25.8 % — ABNORMAL LOW (ref 40.0–52.0)
Hemoglobin: 8.4 g/dL — ABNORMAL LOW (ref 13.0–18.0)
Lymphocytes Relative: 16 %
Lymphs Abs: 1.4 10*3/uL (ref 1.0–3.6)
MCH: 34 pg (ref 26.0–34.0)
MCHC: 32.4 g/dL (ref 32.0–36.0)
MCV: 105 fL — ABNORMAL HIGH (ref 80.0–100.0)
Monocytes Absolute: 1 10*3/uL (ref 0.2–1.0)
Monocytes Relative: 12 %
Neutro Abs: 6 10*3/uL (ref 1.4–6.5)
Neutrophils Relative %: 69 %
Platelets: 79 10*3/uL — ABNORMAL LOW (ref 150–440)
RBC: 2.46 MIL/uL — ABNORMAL LOW (ref 4.40–5.90)
RDW: 24.9 % — ABNORMAL HIGH (ref 11.5–14.5)
WBC: 8.6 10*3/uL (ref 3.8–10.6)

## 2016-08-23 LAB — FERRITIN: Ferritin: 1379 ng/mL — ABNORMAL HIGH (ref 24–336)

## 2016-08-23 LAB — C DIFFICILE QUICK SCREEN W PCR REFLEX
C Diff antigen: NEGATIVE
C Diff interpretation: NOT DETECTED
C Diff toxin: NEGATIVE

## 2016-08-23 LAB — SEDIMENTATION RATE: Sed Rate: 64 mm/hr — ABNORMAL HIGH (ref 0–20)

## 2016-08-23 LAB — MAGNESIUM: Magnesium: 1.1 mg/dL — ABNORMAL LOW (ref 1.7–2.4)

## 2016-08-23 LAB — VITAMIN B12: Vitamin B-12: 639 pg/mL (ref 180–914)

## 2016-08-23 LAB — FOLATE: Folate: 11.7 ng/mL (ref >5.9)

## 2016-08-23 NOTE — Telephone Encounter (Signed)
Called patient's home and spoke to his wife. Informed her that patient is negative for D diff.  Verbalized understanding.

## 2016-08-23 NOTE — Telephone Encounter (Signed)
Called wife and let her know that pt needs magnesium. I know he is taking a nap today and want to see if he can get mag tom.  She is agreeable and it will take 2 hours and with his diarrhea and him weak-if he feels like he wants fluids tomorrow then let me know and we can add fluids on while he is getting mag.  She will let us know and he will need to be there tom 11:15.

## 2016-08-23 NOTE — Progress Notes (Signed)
[facility] day:  08/23/16  Chief Complaint: Logan Perez is a 70 y.o. 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  HPI: The patient was last seen in the medical oncology clinic on 08/16/2016.  At that time, he was seen for his first assessment after transplant.  He was fatigued.  He had lost 26 pounds.  He had occasional nausea.  He had no significant diarrhea.  CBC revealed a platelet count of 64,000.  Albumen was 2.6.  He was on prophylactic valacyclovir for 1 year post transplant.  He had completed prophylactic fluconazole and Levaquin at engraftment.  He was on Dapsone through day 60.  During the interim, he notes feeling tired quickly.  He gets nauseous easily.  His mouth is dry.  Taste has been affected.  Ensure upsets his stomach.  He has a bowel movement 3 times a day.  He is using Imodium.  He denies any fever.   Past Medical History:  Diagnosis Date  . Anxiety   . Atrial fibrillation ([place])   . BPH (benign prostatic hyperplasia)   . Complication of anesthesia    BAD HEADACHE NIGHT OF FIRST CATARACT  . Difficulty voiding   . Dysrhythmia    A FIB  . Elevated PSA   . GERD (gastroesophageal reflux disease)   . HLD (hyperlipidemia)   . HOH (hard of hearing)   . Hypertension   . Multiple myeloma ([place])   . Neuropathy ([place])   . Pain    BACK  . Palpitations   . Pneumonia   . Pulmonary embolism ([place])   . Stroke [facility])    TIA    Past Surgical History:  Procedure Laterality Date  . BACK SURGERY  1994  . CATARACT EXTRACTION W/PHACO Right 05/02/2016   Procedure: CATARACT EXTRACTION PHACO AND INTRAOCULAR LENS PLACEMENT (IOC);  Surgeon: Birder Robson, MD;  Location: [facility] ORS;  Service: Ophthalmology;  Laterality: Right;  [place] 1.06 AP% 20.6 CDE 13.70 FLUID PACK LOT # [id number]  . CATARACT EXTRACTION W/PHACO  Left 05/16/2016   Procedure: CATARACT EXTRACTION PHACO AND INTRAOCULAR LENS PLACEMENT (IOC);  Surgeon: Birder Robson, MD;  Location: [facility] ORS;  Service: Ophthalmology;  Laterality: Left;  [place] 01:43 AP% 19.8 CDE 20.45 FLUID PACK LOT #[id number] H  . KNEE ARTHROSCOPY Left 1992  . PAIN PUMP IMPLANTATION  2012  . stem cell implant  2008   [facility]    Family History  Problem Relation Age of Onset  . Cancer Father     throat  . Kidney disease Sister   . Stroke    . Bladder Cancer Neg Hx   . Prostate cancer Neg Hx     Social History:  reports that he has quit smoking. His smoking use included Cigarettes. He has a 30.00 pack-year smoking history. He has quit using smokeless tobacco. He reports that he does not drink alcohol. His drug history is not on file.  His wife's name is [name].  The patient is accompanied by his wife today.  Allergies:  Allergies  Allergen Reactions  . Azithromycin Diarrhea    Possible cause of C. Diff Possible cause of C. Diff Possible cause of C. Diff Possible cause of C. Diff Possible cause of C. Diff  . 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 Rivaroxaban Rash    Current Medications: Current Outpatient Prescriptions  Medication Sig Dispense Refill  . acetaminophen (TYLENOL) 500 MG tablet Take 1,000 mg by mouth.    . ALPRAZolam (XANAX) 0.25 MG tablet TAKE 1 TABLET BY MOUTH EVERY NIGHT AT BEDTIME AS NEEDED FOR ANXIETY. 30 tablet 3  . baclofen (LIORESAL) 10 MG tablet Take 1 tablet (10 mg total) by mouth at bedtime. 30 each 3  . calcium citrate-vitamin D (CITRACAL+D) 315-200 MG-UNIT per tablet Take 1 tablet by mouth daily.     . cetirizine (ZYRTEC) 10 MG tablet Take 10 mg by mouth daily.     . Cholecalciferol (VITAMIN D3) 2000 UNITS capsule Take 2,000 Units by mouth daily.     .  Cyanocobalamin (RA VITAMIN B-12 TR) 1000 MCG TBCR Take 1,000 mcg by mouth daily.     . Daratumumab (DARZALEX IV) Inject 1,200 mg into the vein every 30 (thirty) days.     [name] dexamethasone (DECADRON) 4 MG tablet TAKE 4 TABLETS BY MOUTH ON THE DAY OF TREATMENT AND 1 TABLET BY MOUTH ON THE DAY AFTER TREATMENT 20 tablet 0  . diltiazem (CARDIZEM) 60 MG tablet Take 60 mg by mouth as needed.    . diltiazem (DILACOR XR) 120 MG 24 hr capsule Take 120 mg by mouth daily.    [name] ELIQUIS 5 MG TABS tablet TAKE 1 TABLET(5 MG) BY MOUTH TWICE DAILY 60 tablet 0  . fluticasone (FLONASE) 50 MCG/ACT nasal spray Place 1 spray into the nose daily as needed for allergies.     . Iron-Vitamins (GERITOL PO) Take by mouth.    . loperamide (IMODIUM) 2 MG capsule Take 2 mg by mouth as needed for diarrhea or loose stools. Reported on 04/26/2016    . lovastatin (MEVACOR) 20 MG tablet     . magnesium oxide (MAG-OX) 400 MG tablet Take 400 mg by mouth.    . montelukast (SINGULAIR) 10 MG tablet TAKE 1 TABLET BY MOUTH ONCE A DAY AS DIRECTED. 30 tablet 0  . omeprazole (PRILOSEC) 20 MG capsule Take 20 mg by mouth daily.     . potassium chloride SA (K-DUR,KLOR-CON) 20 MEQ tablet TAKE 1 TABLET(20 MEQ) BY MOUTH TWICE DAILY 180 tablet 0  . prochlorperazine (COMPAZINE) 10 MG tablet Take 10 mg by mouth every 6 (six) hours as needed for nausea or vomiting.    . ranitidine (ZANTAC) 150 MG tablet Take 150 mg by mouth daily.     . tamsulosin (FLOMAX) 0.4 MG CAPS capsule Take 1 capsule (0.4 mg total) by mouth daily. 90 capsule 4  . valACYclovir (VALTREX) 500 MG tablet TAKE 1 TABLET BY MOUTH DAILY 30 tablet 0  . PAIN MANAGEMENT IT PUMP REFILL 1 each by Intrathecal route once. Medication: PF Fentanyl 1,500.0 mcg/ml PF Bupivicaine 30.0 mg/ml PF Clonidine 300.86mg/ml Total Volume: 40 ml Needed by 06-13-16 @ 1100 1 each 0   No current facility-administered medications for this visit.     Review of Systems:  GENERAL: Fatigue.  No fevers or sweats.   Weight down 26 pounds since transplant (weight down 2 pounds since last visit). PERFORMANCE STATUS (ECOG): 1 HEENT:  Cataracts s/p surgery.  Taste buds "shot".  No sore throat, mouth sores or tenderness. Lungs: No shortness of breath or cough.  No hemoptysis. Cardiac:  No chest pain, palpitations, orthopnea, or PND. GI:  Stool 3 times a day.  Appetite good.  Nausea  managed with Compazine.  No vomiting, diarrhea, constipation, melena or hematochezia. GU:  No urgency, frequency, dysuria, or hematuria. Musculoskeletal:  Compression fracture of back on a pain pump. No muscle tenderness. Extremities:  No pain or swelling. Skin:  Fragile skin.  No unexplained bruises.  No rashes or skin changes. Neuro:  Little tremor (chronic).  Neuropathy in feet.  No headache, weakness, balance or coordination issues. Endocrine:  No diabetes, thyroid issues, hot flashes or night sweats. Psych:  No mood changes, depression or anxiety. Pain:  Chronic back pain (pain well managed with pump). Review of systems:  All other systems reviewed and found to be negative.   Physical Exam: Blood pressure (!) 88/61, pulse (!) 123, temperature (!) 95.8 F (35.4 C), temperature source Tympanic, resp. rate 18, weight 141 lb 8 oz (64.2 kg). GENERAL:  Chronically ill appearing frail gentleman sitting comfortably in the exam room in no acute distress.  He has a rolling walker at his side. MENTAL STATUS:  Alert and oriented to person, place and time. HEAD:  Wearing a gray cap.  Temporal wasting.  Normocephalic, atraumatic, face symmetric, no Cushingoid features. EYES:  Glasses.  Blue eyes s/p cataract surgery.  Pupils equal round and reactive to light and accomodation.  No conjunctivitis or scleral icterus. ENT:  Hearing aide.  Oropharynx clear without lesion.  Hearing aide.  Tongue normal. Mucous membranes moist.  RESPIRATORY:  Clear to auscultation without rales, wheezes or rhonchi. CARDIOVASCULAR:  Regular rate and rhythm without  murmur, rub or gallop. ABDOMEN:  RUQ pain pump.  Soft, non-tender, with active bowel sounds, and no hepatosplenomegaly.  No masses. SKIN:  No rashes, ulcers or lesions. EXTREMITIES: No edema, no skin discoloration or tenderness.  No palpable cords. LYMPH NODES: No palpable cervical, supraclavicular, axillary or inguinal adenopathy  NEUROLOGICAL: Intention tremor. PSYCH:  Appropriate.    Appointment on 08/23/2016  Component Date Value Ref Range Status  . WBC 08/23/2016 8.6  3.8 - 10.6 K/uL Final  . RBC 08/23/2016 2.46* 4.40 - 5.90 MIL/uL Final  . Hemoglobin 08/23/2016 8.4* 13.0 - 18.0 g/dL Final  . HCT 08/23/2016 25.8* 40.0 - 52.0 % Final  . MCV 08/23/2016 105.0* 80.0 - 100.0 fL Final  . MCH 08/23/2016 34.0  26.0 - 34.0 pg Final  . MCHC 08/23/2016 32.4  32.0 - 36.0 g/dL Final  . RDW 08/23/2016 24.9* 11.5 - 14.5 % Final  . Platelets 08/23/2016 79* 150 - 440 K/uL Final  . Neutrophils Relative % 08/23/2016 69  % Final  . Neutro Abs 08/23/2016 6.0  1.4 - 6.5 K/uL Final  . Lymphocytes Relative 08/23/2016 16  % Final  . Lymphs Abs 08/23/2016 1.4  1.0 - 3.6 K/uL Final  . Monocytes Relative 08/23/2016 12  % Final  . Monocytes Absolute 08/23/2016 1.0  0.2 - 1.0 K/uL Final  . Eosinophils Relative 08/23/2016 2  % Final  . Eosinophils Absolute 08/23/2016 0.1  0 - 0.7 K/uL Final  . Basophils Relative 08/23/2016 1  % Final  . Basophils Absolute 08/23/2016 0.1  0 - 0.1 K/uL Final    Assessment:  Ariyan Brisendine is a 70 y.o. male with stage III mutiple myeloma.  He initially presented with progressive back pain beginning in 12/2006.  MRI revealed "spots and compression fractures".  He began Velcade, thalidomide, and Decadron.  In 08/2007, he underwent high dose chemotherapy and autologous stem cell transplant.  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  He recurred with a rising M-spike (  2.7) with repeat M spike (1.7 gm/dl) in 03/2010.  He was initially treated with  Velcade (02/08/2010 - 05/10/2010).  He then began Revlimid (15 mg 3 weeks on/1 week off) and Decadron (40 mg on day 1, 8, 15, 22).  Because of significant side effect with Decadron his dose was decreased to 10 mg once a week in 07/2010.    He was on maintenance Revlimid. Revlimid was initially10 mg 3 weeks on/1 week off. This was changed to 10 mg 2 weeks on/2 weeks off secondary to right nipple tenderness. His dose was increased to 10 mg 3 weeks on/1 week off with Decadron 10 mg a week (on Sundays) and then Revlamid 15 mg 3 weeks on and 1 week off with Decadron on Sundays.  He began Pomalyst 4 mg 3 weeks on/1 week off with Decadron on 08/27/2015.  Over the past year his SPEP has revealed no monoclonal protein (04/21/2015) and 0.5 gm/dL on 09/22/2015 and 10/20/2015. M spike was 0.1 on 02/02/2016, 03/01/2016, 03/29/2016, 04/26/2016, and 05/24/2016.  Free light chains have been monitored. Kappa free light chains were 18.54 on 11/21/2013, 18.37 on 02/20/2014, 18.93 on 05/22/2014, 32.58 (high; normal ratio 1.27) on 08/21/2014, 51.53 (high; elevated ratio of 2.12) on 11/13/2014, 28.08 (ratio 1.73) on 12/09/2014, 23.71 (ratio 2.17) on 01/18/2015, 92.93 (ratio 9.49) on 04/21/2015, 93.44 (ratio 10.28) on 05/26/2015, 255.45 (ratio of 24.05) on 07/14/2015, 373.89 (ratio 48.31) on 08/04/2015, 474.33 (ratio 70.58) on 08/25/2015, 450.76 (ratio 55.44) on 09/22/2015, 453.4 (ratio 59.89) on 10/20/2015, 58.26 (ratio of > 40.74) on 02/02/2016, 62.67 (ratio of 37.98) on 03/01/2016, 75.7 (ratio > 50.47) on 03/29/2016, 79.7 (ratio 53.13) on 04/26/2016, and 108.6 (ratio > 72.4) on 05/24/2016.  Bone survey on 12/08/2014 was stable.  Bone survey on 10/21/2015 revealed increase conspicuity of subcentimeter lytic lesions in the calvarium.  Bone marrow aspirate and biopsy on 11/04/2015 revealed an atypical monoclonal plasma cells estimated at 30-40% of marrow cells.   Marrow was variably cellular (approximately 45%) with  background trilineage hematopoiesis. There was no significant increase in marrow reticulin fibers. Storage iron was present.    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 He develoed herpes zoster in 04/2008. He developed a pulmonary embolism in 05/2013. He was initially on Xarelto, but is now on Eliquis. He had an episode of pneumonia around this time requiring a brief admission. He developed severe lower leg cramps on 08/07/2014 secondary to hypokalemia. Duplex was negative.   He was treated for C difficile colitis (Flagyl completed 07/30/2015).  He has a chronic indwelling pain pump.  He received 4 cycles of Pomalyst and Decadron (08/27/2015 - 11/19/2015).  Restaging studies document progressive disease.  Kappa free light chains are increasing.  SPEP revealed 0.5 gm/dL monoclonal protein then 1.3 gm/dL.  Bone survey reveals increase conspicuity of subcentimeter lytic lesions in the calvarium.  Bone marrow reveals 30-40% plasma cells.   MUGA on 11/30/2015 revealed an ejection fraction of 46%.  He is felt not to be a good candidate for Kyprolis.  There were no focal wall motion abnormalities.  He had a stress echo less than 1 year ago.  He has a history of PVCs and atrial fibrillation.  He takes Cardizem prn.  He is s/p 17 weeks of daratumumab (Darzalex) (12/09/2015 - 05/25/2016).  He tolerated treatment well without side effect.  Bone marrow on 02/09/2016 revealed no diagnostic morphologic evidence of plasma cell myeloma.  Marrow was normocellular to hypocellular marrow for  age (ranging from 10-40%) with maturing trilineage hematopoiesis and mild multilineage dyspoiesis.  There was patchy mild increase in reticulin.  Storage iron was present.  Flow cytometry revealed no definitive evidence of monoclonality.  There was a non-specific atypical myeloid and monocytic findings with no increase in blasts.  Cytogenetics were normal (46, XY).  He  is currently day 60 s/p 2nd autologous stem cell transplant on 06/16/2016.  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  He also experienced atrial fibrillation with intermittent episodes of RVR requiring IV beta blockers.  He is on prophylactic valacyclovir for 1 year post transplant.  He has a macrocytic anemia.  Platelet count is improving slowly post transplant.  Platelet count today is 79,000.  Symptomatically, he is fatigued.  He has lost 26 pounds since transplant.  He has occasional nausea.  He has 3 bowel movements/day.   Plan:  1.  Labs today:  CBC with diff, CMP, Mg, ferritin, iron studies, B12, folate. 2.  Stool for C diff. 3.  Discuss caloric intake. 4.  RTC in 1 week in [place] for covering MD and labs (CBC with diff, CMP, ferritin, iron studies, B12, folate, hold tube) +/- transfusion. 5.  RTC in 2 weeks in [place] for MD assessment and labs (CBC with diff, CMP, Mg, hold tube).  Addendum:  Magnesium was 1.1.  Patient will receive IVF + 3 gm of magnesium sulfate tomorrow.  Labs (BMP and Mg) will be rechecked on 08/25/2016 for possible additional magnesium and fluids.   Lequita Asal, MD  08/23/2016, 12:35 PM

## 2016-08-23 NOTE — Progress Notes (Signed)
Patient states he is doing better.  Having PT 4 times a week.  Appetite is good.  BP continues to be decreases.  Sitting 89/56  HR 128  Standing 88/61  HR 123.

## 2016-08-23 NOTE — Telephone Encounter (Signed)
-----   Message from Lequita Asal, MD sent at 08/23/2016  3:43 PM EST ----- Regarding: Please call patient  C diff negative.  M  ----- Message ----- From: Interface, Lab In [place] Sent: 08/23/2016   2:13 PM To: Lequita Asal, MD

## 2016-08-24 ENCOUNTER — Other Ambulatory Visit: Payer: Self-pay | Admitting: *Deleted

## 2016-08-24 ENCOUNTER — Inpatient Hospital Stay: Payer: Medicare Other

## 2016-08-24 ENCOUNTER — Telehealth: Payer: Self-pay | Admitting: *Deleted

## 2016-08-24 VITALS — BP 104/70 | HR 101 | Temp 96.4°F | Resp 18

## 2016-08-24 DIAGNOSIS — C9002 Multiple myeloma in relapse: Secondary | ICD-10-CM | POA: Diagnosis not present

## 2016-08-24 DIAGNOSIS — C9000 Multiple myeloma not having achieved remission: Secondary | ICD-10-CM

## 2016-08-24 MED ORDER — SODIUM CHLORIDE 0.9 % IV SOLN
INTRAVENOUS | Status: DC
  Administered 2016-08-24: 12:00:00 via INTRAVENOUS
  Filled 2016-08-24 (×2): qty 500

## 2016-08-24 NOTE — Telephone Encounter (Signed)
Called and spoke to wife and made appt for mag 11/16 in [place] at 11:00

## 2016-08-24 NOTE — Telephone Encounter (Signed)
Called pt to let him know that she wants mag repeated tom. To decide on how much mag to do. He is agreeable and he will come 15 min. Early to get labs done.

## 2016-08-24 NOTE — Telephone Encounter (Signed)
-----   Message from Lequita Asal, MD sent at 08/23/2016  1:52 PM EST ----- Regarding: Patient needs magnesium!!   ----- Message ----- From: Interface, Lab In [place] Sent: 08/23/2016  12:29 PM To: Lequita Asal, MD

## 2016-08-25 ENCOUNTER — Inpatient Hospital Stay: Payer: Medicare Other

## 2016-08-25 DIAGNOSIS — C9000 Multiple myeloma not having achieved remission: Secondary | ICD-10-CM

## 2016-08-25 DIAGNOSIS — C9002 Multiple myeloma in relapse: Secondary | ICD-10-CM | POA: Diagnosis not present

## 2016-08-25 LAB — BASIC METABOLIC PANEL
Anion gap: 6 (ref 5–15)
BUN: 13 mg/dL (ref 6–20)
CO2: 20 mmol/L — ABNORMAL LOW (ref 22–32)
Calcium: 7.9 mg/dL — ABNORMAL LOW (ref 8.9–10.3)
Chloride: 110 mmol/L (ref 101–111)
Creatinine, Ser: 0.98 mg/dL (ref 0.61–1.24)
GFR calc Af Amer: >60 mL/min (ref >60)
GFR calc non Af Amer: >60 mL/min (ref >60)
Glucose, Bld: 91 mg/dL (ref 65–99)
Potassium: 4.4 mmol/L (ref 3.5–5.1)
Sodium: 136 mmol/L (ref 135–145)

## 2016-08-25 LAB — MAGNESIUM: Magnesium: 1.8 mg/dL (ref 1.7–2.4)

## 2016-08-30 ENCOUNTER — Inpatient Hospital Stay: Payer: Medicare Other

## 2016-08-30 ENCOUNTER — Inpatient Hospital Stay (HOSPITAL_BASED_OUTPATIENT_CLINIC_OR_DEPARTMENT_OTHER): Payer: Medicare Other | Admitting: Internal Medicine

## 2016-08-30 VITALS — BP 97/66 | HR 109 | Temp 96.7°F | Resp 18 | Wt 145.3 lb

## 2016-08-30 DIAGNOSIS — E440 Moderate protein-calorie malnutrition: Secondary | ICD-10-CM

## 2016-08-30 DIAGNOSIS — Z9484 Stem cells transplant status: Secondary | ICD-10-CM

## 2016-08-30 DIAGNOSIS — Z9225 Personal history of immunosupression therapy: Secondary | ICD-10-CM

## 2016-08-30 DIAGNOSIS — D696 Thrombocytopenia, unspecified: Secondary | ICD-10-CM

## 2016-08-30 DIAGNOSIS — C9000 Multiple myeloma not having achieved remission: Secondary | ICD-10-CM

## 2016-08-30 DIAGNOSIS — R197 Diarrhea, unspecified: Secondary | ICD-10-CM

## 2016-08-30 DIAGNOSIS — R5383 Other fatigue: Secondary | ICD-10-CM

## 2016-08-30 DIAGNOSIS — C9002 Multiple myeloma in relapse: Secondary | ICD-10-CM | POA: Diagnosis not present

## 2016-08-30 DIAGNOSIS — D649 Anemia, unspecified: Secondary | ICD-10-CM

## 2016-08-30 LAB — CBC WITH DIFFERENTIAL/PLATELET
Basophils Absolute: 0.1 10*3/uL (ref 0–0.1)
Basophils Relative: 1 %
Eosinophils Absolute: 0.2 10*3/uL (ref 0–0.7)
Eosinophils Relative: 2 %
HCT: 23.1 % — ABNORMAL LOW (ref 40.0–52.0)
Hemoglobin: 7.8 g/dL — ABNORMAL LOW (ref 13.0–18.0)
Lymphocytes Relative: 15 %
Lymphs Abs: 1.4 10*3/uL (ref 1.0–3.6)
MCH: 36.7 pg — ABNORMAL HIGH (ref 26.0–34.0)
MCHC: 33.6 g/dL (ref 32.0–36.0)
MCV: 109.2 fL — ABNORMAL HIGH (ref 80.0–100.0)
Monocytes Absolute: 1.2 10*3/uL — ABNORMAL HIGH (ref 0.2–1.0)
Monocytes Relative: 13 %
Neutro Abs: 6.5 10*3/uL (ref 1.4–6.5)
Neutrophils Relative %: 69 %
Platelets: 101 10*3/uL — ABNORMAL LOW (ref 150–440)
RBC: 2.12 MIL/uL — ABNORMAL LOW (ref 4.40–5.90)
RDW: 26.2 % — ABNORMAL HIGH (ref 11.5–14.5)
WBC: 9.4 10*3/uL (ref 3.8–10.6)

## 2016-08-30 LAB — FOLATE: Folate: 19.2 ng/mL (ref >5.9)

## 2016-08-30 LAB — COMPREHENSIVE METABOLIC PANEL
ALT: 12 U/L — ABNORMAL LOW (ref 17–63)
AST: 21 U/L (ref 15–41)
Albumin: 2.8 g/dL — ABNORMAL LOW (ref 3.5–5.0)
Alkaline Phosphatase: 99 U/L (ref 38–126)
Anion gap: 9 (ref 5–15)
BUN: 9 mg/dL (ref 6–20)
CO2: 20 mmol/L — ABNORMAL LOW (ref 22–32)
Calcium: 8.5 mg/dL — ABNORMAL LOW (ref 8.9–10.3)
Chloride: 109 mmol/L (ref 101–111)
Creatinine, Ser: 1.17 mg/dL (ref 0.61–1.24)
GFR calc Af Amer: >60 mL/min (ref >60)
GFR calc non Af Amer: >60 mL/min (ref >60)
Glucose, Bld: 79 mg/dL (ref 65–99)
Potassium: 3.9 mmol/L (ref 3.5–5.1)
Sodium: 138 mmol/L (ref 135–145)
Total Bilirubin: 0.5 mg/dL (ref 0.3–1.2)
Total Protein: 5.3 g/dL — ABNORMAL LOW (ref 6.5–8.1)

## 2016-08-30 LAB — PREPARE RBC (CROSSMATCH)

## 2016-08-30 LAB — IRON AND TIBC
Iron: 111 ug/dL (ref 45–182)
Saturation Ratios: 75 % — ABNORMAL HIGH (ref 17.9–39.5)
TIBC: 147 ug/dL — ABNORMAL LOW (ref 250–450)
UIBC: 36 ug/dL

## 2016-08-30 LAB — VITAMIN B12: Vitamin B-12: 746 pg/mL (ref 180–914)

## 2016-08-30 LAB — MAGNESIUM: Magnesium: 1.4 mg/dL — ABNORMAL LOW (ref 1.7–2.4)

## 2016-08-30 LAB — SAMPLE TO BLOOD BANK

## 2016-08-30 LAB — FERRITIN: Ferritin: 1276 ng/mL — ABNORMAL HIGH (ref 24–336)

## 2016-08-30 MED ORDER — DIPHENHYDRAMINE HCL 25 MG PO TABS
25.0000 mg | ORAL_TABLET | Freq: Once | ORAL | Status: AC
  Administered 2016-08-30: 25 mg via ORAL
  Filled 2016-08-30: qty 1

## 2016-08-30 MED ORDER — MAGNESIUM CHLORIDE 64 MG PO TBEC: 2.0000 | DELAYED_RELEASE_TABLET | Freq: Four times a day (QID) | ORAL | 0 refills | Status: AC

## 2016-08-30 MED ORDER — ACETAMINOPHEN 325 MG PO TABS
650.0000 mg | ORAL_TABLET | Freq: Once | ORAL | Status: AC
Start: 2016-08-30 — End: 2016-08-30
  Administered 2016-08-30: 650 mg via ORAL
  Filled 2016-08-30: qty 2

## 2016-08-30 MED ORDER — ACETAMINOPHEN 325 MG PO TABS
650.0000 mg | ORAL_TABLET | Freq: Once | ORAL | Status: AC
  Administered 2016-08-30: 650 mg via ORAL
  Filled 2016-08-30: qty 2

## 2016-08-30 MED ORDER — DIPHENHYDRAMINE HCL 25 MG PO CAPS
ORAL_CAPSULE | ORAL | Status: AC
  Filled 2016-08-30: qty 1

## 2016-08-30 MED ORDER — SODIUM CHLORIDE 0.9 % IV SOLN
250.0000 mL | Freq: Once | INTRAVENOUS | Status: AC
  Administered 2016-08-30: 250 mL via INTRAVENOUS
  Filled 2016-08-30: qty 250

## 2016-08-30 MED ORDER — DIPHENHYDRAMINE HCL 25 MG PO CAPS
25.0000 mg | ORAL_CAPSULE | Freq: Once | ORAL | Status: AC
  Administered 2016-08-30: 25 mg via ORAL
  Filled 2016-08-30: qty 1

## 2016-08-30 MED ORDER — MAGNESIUM SULFATE 4 GM/100ML IV SOLN
4.0000 g | Freq: Once | INTRAVENOUS | Status: AC
  Administered 2016-08-30: 4 g via INTRAVENOUS
  Filled 2016-08-30: qty 100

## 2016-08-30 NOTE — Progress Notes (Signed)
Pt here for blood transfusion. Blood bank called stating that pt's unit of blood is enroute from [place] and it will be late afternoon before it arrives. Explained to patient and pt's wife. Pt and wife went home and left phone number with staff. Will call pt when blood has arrived and pt and wife to come back for transfusion. IV saline locked and wrapped.

## 2016-08-30 NOTE — Progress Notes (Signed)
Pt states is feeling weak with decreased energy today.

## 2016-08-30 NOTE — Progress Notes (Signed)
I was asked to see Logan Perez today to evaluate need for any interval transfusions and electrolyte replacement  He is a patient with an extensive hsiotry of Multiple myeloma is s/p second autologous stemn cell tarnsplant at [facility], He is currently on daratumumab. ( Refer to Dr. Kem Parkinson note dated 08/23/2016 for details)  He feels tired today, but without any bleeding or brusuing. He denies any fever, or rashes, he denies any diarrhea, or nausea or vomitting.  His vitals are stable today Physical Exam  Constitutional: He is oriented to person, place, and time. He appears well-developed.  HENT:  Head: Normocephalic and atraumatic.  Eyes: Conjunctivae are normal. Pupils are equal, round, and reactive to light.  Neck: Normal range of motion. Neck supple.  Cardiovascular: Normal rate and regular rhythm.   Pulmonary/Chest: Effort normal and breath sounds normal.  Abdominal: Soft.  Musculoskeletal: He exhibits no edema.  Lymphadenopathy:    He has no cervical adenopathy.  Neurological: He is alert and oriented to person, place, and time.  Skin: No rash noted. No erythema. There is pallor.  Psychiatric: He has a normal mood and affect. Judgment and thought content normal.  Nursing note and vitals reviewed.   Cbc shows hgb 7.8 Wbc 9.4. 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 He needs IV mag replacement today Since he had been on daratumumab, blood bank is notified.   He will call for any new problems, return on Monday after thanksgiving for repeat CBc

## 2016-08-31 LAB — TYPE AND SCREEN
ABO/RH(D): B POS
Antibody Screen: NEGATIVE
Unit division: 0

## 2016-09-04 ENCOUNTER — Ambulatory Visit: Payer: Medicare Other | Admitting: Hematology and Oncology

## 2016-09-04 ENCOUNTER — Telehealth: Payer: Self-pay | Admitting: *Deleted

## 2016-09-04 ENCOUNTER — Other Ambulatory Visit: Payer: Medicare Other

## 2016-09-04 ENCOUNTER — Other Ambulatory Visit: Payer: Self-pay

## 2016-09-04 ENCOUNTER — Inpatient Hospital Stay: Payer: Medicare Other

## 2016-09-04 ENCOUNTER — Other Ambulatory Visit: Payer: Self-pay | Admitting: *Deleted

## 2016-09-04 DIAGNOSIS — C9002 Multiple myeloma in relapse: Secondary | ICD-10-CM | POA: Diagnosis not present

## 2016-09-04 DIAGNOSIS — C9000 Multiple myeloma not having achieved remission: Secondary | ICD-10-CM

## 2016-09-04 DIAGNOSIS — D696 Thrombocytopenia, unspecified: Secondary | ICD-10-CM

## 2016-09-04 DIAGNOSIS — D649 Anemia, unspecified: Secondary | ICD-10-CM

## 2016-09-04 DIAGNOSIS — E440 Moderate protein-calorie malnutrition: Secondary | ICD-10-CM

## 2016-09-04 LAB — CBC WITH DIFFERENTIAL/PLATELET
Basophils Absolute: 0.1 10*3/uL (ref 0–0.1)
Basophils Relative: 1 %
Eosinophils Absolute: 0.3 10*3/uL (ref 0–0.7)
Eosinophils Relative: 3 %
HCT: 29.1 % — ABNORMAL LOW (ref 40.0–52.0)
Hemoglobin: 9.7 g/dL — ABNORMAL LOW (ref 13.0–18.0)
Lymphocytes Relative: 12 %
Lymphs Abs: 1.2 10*3/uL (ref 1.0–3.6)
MCH: 36.2 pg — ABNORMAL HIGH (ref 26.0–34.0)
MCHC: 33.3 g/dL (ref 32.0–36.0)
MCV: 108.6 fL — ABNORMAL HIGH (ref 80.0–100.0)
Monocytes Absolute: 1.1 10*3/uL — ABNORMAL HIGH (ref 0.2–1.0)
Monocytes Relative: 12 %
Neutro Abs: 7 10*3/uL — ABNORMAL HIGH (ref 1.4–6.5)
Neutrophils Relative %: 72 %
Platelets: 129 10*3/uL — ABNORMAL LOW (ref 150–440)
RBC: 2.68 MIL/uL — ABNORMAL LOW (ref 4.40–5.90)
RDW: 24.6 % — ABNORMAL HIGH (ref 11.5–14.5)
WBC: 9.7 10*3/uL (ref 3.8–10.6)

## 2016-09-04 LAB — COMPREHENSIVE METABOLIC PANEL
ALT: 12 U/L — ABNORMAL LOW (ref 17–63)
AST: 22 U/L (ref 15–41)
Albumin: 3 g/dL — ABNORMAL LOW (ref 3.5–5.0)
Alkaline Phosphatase: 86 U/L (ref 38–126)
Anion gap: 8 (ref 5–15)
BUN: 10 mg/dL (ref 6–20)
CO2: 22 mmol/L (ref 22–32)
Calcium: 8.6 mg/dL — ABNORMAL LOW (ref 8.9–10.3)
Chloride: 109 mmol/L (ref 101–111)
Creatinine, Ser: 1.08 mg/dL (ref 0.61–1.24)
GFR calc Af Amer: >60 mL/min (ref >60)
GFR calc non Af Amer: >60 mL/min (ref >60)
Glucose, Bld: 100 mg/dL — ABNORMAL HIGH (ref 65–99)
Potassium: 4.2 mmol/L (ref 3.5–5.1)
Sodium: 139 mmol/L (ref 135–145)
Total Bilirubin: 0.5 mg/dL (ref 0.3–1.2)
Total Protein: 5.6 g/dL — ABNORMAL LOW (ref 6.5–8.1)

## 2016-09-04 LAB — SAMPLE TO BLOOD BANK

## 2016-09-04 LAB — MAGNESIUM: Magnesium: 1.5 mg/dL — ABNORMAL LOW (ref 1.7–2.4)

## 2016-09-04 MED ORDER — PAIN MANAGEMENT IT PUMP REFILL: 1.0000 | Freq: Once | INTRATHECAL | 0 refills | Status: DC

## 2016-09-04 MED ORDER — MAGNESIUM SULFATE 2 GM/50ML IV SOLN: 2.0000 g | Freq: Once | INTRAVENOUS | Status: DC

## 2016-09-04 MED ORDER — SODIUM CHLORIDE 0.9 % IV SOLN: 2.0000 g | Freq: Once | INTRAVENOUS | Status: DC

## 2016-09-04 NOTE — Telephone Encounter (Signed)
Asked wife to bring him in today for blood work due to how long it takes if he needs blood transfusion because of how special his blood has to be obtained and it takes so long to get it. Wife is agreeable and she will bring him over.

## 2016-09-04 NOTE — Telephone Encounter (Signed)
Called and spoke to pt about hgb is good and he does not need a transfusion but does need mag. He will be here tom.

## 2016-09-05 ENCOUNTER — Inpatient Hospital Stay: Payer: Medicare Other

## 2016-09-05 ENCOUNTER — Encounter: Payer: Self-pay | Admitting: Hematology and Oncology

## 2016-09-05 ENCOUNTER — Inpatient Hospital Stay (HOSPITAL_BASED_OUTPATIENT_CLINIC_OR_DEPARTMENT_OTHER): Payer: Medicare Other | Admitting: Hematology and Oncology

## 2016-09-05 VITALS — BP 96/67 | HR 101 | Temp 95.5°F | Resp 18 | Wt 144.4 lb

## 2016-09-05 DIAGNOSIS — R682 Dry mouth, unspecified: Secondary | ICD-10-CM

## 2016-09-05 DIAGNOSIS — Z8739 Personal history of other diseases of the musculoskeletal system and connective tissue: Secondary | ICD-10-CM

## 2016-09-05 DIAGNOSIS — I4891 Unspecified atrial fibrillation: Secondary | ICD-10-CM

## 2016-09-05 DIAGNOSIS — C9002 Multiple myeloma in relapse: Secondary | ICD-10-CM | POA: Diagnosis not present

## 2016-09-05 DIAGNOSIS — C9000 Multiple myeloma not having achieved remission: Secondary | ICD-10-CM

## 2016-09-05 DIAGNOSIS — G8929 Other chronic pain: Secondary | ICD-10-CM

## 2016-09-05 DIAGNOSIS — M549 Dorsalgia, unspecified: Secondary | ICD-10-CM

## 2016-09-05 DIAGNOSIS — E785 Hyperlipidemia, unspecified: Secondary | ICD-10-CM

## 2016-09-05 DIAGNOSIS — Z8673 Personal history of transient ischemic attack (TIA), and cerebral infarction without residual deficits: Secondary | ICD-10-CM

## 2016-09-05 DIAGNOSIS — Z9484 Stem cells transplant status: Secondary | ICD-10-CM

## 2016-09-05 DIAGNOSIS — E876 Hypokalemia: Secondary | ICD-10-CM

## 2016-09-05 DIAGNOSIS — K117 Disturbances of salivary secretion: Secondary | ICD-10-CM

## 2016-09-05 DIAGNOSIS — Z7901 Long term (current) use of anticoagulants: Secondary | ICD-10-CM

## 2016-09-05 DIAGNOSIS — D649 Anemia, unspecified: Secondary | ICD-10-CM

## 2016-09-05 DIAGNOSIS — F419 Anxiety disorder, unspecified: Secondary | ICD-10-CM

## 2016-09-05 DIAGNOSIS — I10 Essential (primary) hypertension: Secondary | ICD-10-CM

## 2016-09-05 DIAGNOSIS — D696 Thrombocytopenia, unspecified: Secondary | ICD-10-CM

## 2016-09-05 DIAGNOSIS — R110 Nausea: Secondary | ICD-10-CM

## 2016-09-05 DIAGNOSIS — K219 Gastro-esophageal reflux disease without esophagitis: Secondary | ICD-10-CM

## 2016-09-05 DIAGNOSIS — D638 Anemia in other chronic diseases classified elsewhere: Secondary | ICD-10-CM

## 2016-09-05 DIAGNOSIS — Z86711 Personal history of pulmonary embolism: Secondary | ICD-10-CM

## 2016-09-05 DIAGNOSIS — R5383 Other fatigue: Secondary | ICD-10-CM

## 2016-09-05 DIAGNOSIS — Z79899 Other long term (current) drug therapy: Secondary | ICD-10-CM

## 2016-09-05 DIAGNOSIS — Z9225 Personal history of immunosupression therapy: Secondary | ICD-10-CM

## 2016-09-05 DIAGNOSIS — D61818 Other pancytopenia: Secondary | ICD-10-CM

## 2016-09-05 DIAGNOSIS — N400 Enlarged prostate without lower urinary tract symptoms: Secondary | ICD-10-CM

## 2016-09-05 DIAGNOSIS — I493 Ventricular premature depolarization: Secondary | ICD-10-CM

## 2016-09-05 DIAGNOSIS — R531 Weakness: Secondary | ICD-10-CM

## 2016-09-05 DIAGNOSIS — Z87891 Personal history of nicotine dependence: Secondary | ICD-10-CM

## 2016-09-05 MED ORDER — MAGNESIUM SULFATE 2 GM/50ML IV SOLN
2.0000 g | Freq: Once | INTRAVENOUS | Status: AC
Start: 2016-09-05 — End: 2016-09-05
  Administered 2016-09-05: 2 g via INTRAVENOUS
  Filled 2016-09-05: qty 50

## 2016-09-05 NOTE — Progress Notes (Signed)
[facility]-  Cancer Center  Clinic day:  09/05/16  Chief Complaint: Logan Perez is a 70 y.o. 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  HPI: The patient was last seen in the medical oncology clinic on 08/23/2016.  At that time, he was easily fatigued.  He had occasional nausea.  He had 3 bowel movements/day.  C diff was negative.  Magnesium was 1.1.  He received IVF and 3 gm of magnesium sulfate.  CBC revealed a hematocrit of 25.8, hemoglobin 8.4, MCV 105, platelets 79,000, WBC 8600 with an ANC of 6000.  B12 was 639.  Folate was 11.7.  Ferritin was 1379.  Iron studies included a saturation of 56% and TIBC 141 (low).  ESR was 64.  Labs on 08/25/2016 revealed a creatinine of 0.98, potassium 4.4, and magnesium 1.8.  He was seen by Dr. Perumandla on 08/30/2016.  At that time, he felt weak.  CBC revealed a hematocrit of 23.1, hemoglobin 7.8, platelets 101,000, WBC 9400 with an ANC of 6500.   He received 1 unit of irradiated PRBCs.  Labs on 09/04/2016 revealed a hematocrit 29.1, hemoglobin 9.7, platelets 129,000, white count 9700 with an ANC of 7000.  Comprehensive metabolic panel included a of 139, potassium 4.2, creatinine 1.08, calcium 8.6, and albumin 3.  Magnesium was 1.5.  Symptomatically, he feels "good".  Appetite is good.  He ate well at Thanksgiving.  He still has a dry mouth.  He is walking with physical therapy.   Past Medical History:  Diagnosis Date  . Anxiety   . Atrial fibrillation (HCC)   . BPH (benign prostatic hyperplasia)   . Complication of anesthesia    BAD HEADACHE NIGHT OF FIRST CATARACT  . Difficulty voiding   . Dysrhythmia    A FIB  . Elevated PSA   . GERD (gastroesophageal reflux disease)   . HLD (hyperlipidemia)   . HOH (hard of hearing)   . Hypertension   . Multiple myeloma (HCC)   . Neuropathy (HCC)   . Pain     BACK  . Palpitations   . Pneumonia   . Pulmonary embolism (HCC)   . Stroke (HCC)    TIA    Past Surgical History:  Procedure Laterality Date  . BACK SURGERY  1994  . CATARACT EXTRACTION W/PHACO Right 05/02/2016   Procedure: CATARACT EXTRACTION PHACO AND INTRAOCULAR LENS PLACEMENT (IOC);  Surgeon: William Porfilio, MD;  Location: [facility] ORS;  Service: Ophthalmology;  Laterality: Right;  US 1.06 AP% 20.6 CDE 13.70 FLUID PACK LOT # [id number]  . CATARACT EXTRACTION W/PHACO Left 05/16/2016   Procedure: CATARACT EXTRACTION PHACO AND INTRAOCULAR LENS PLACEMENT (IOC);  Surgeon: William Porfilio, MD;  Location: [facility] ORS;  Service: Ophthalmology;  Laterality: Left;  US 01:43 AP% 19.8 CDE 20.45 FLUID PACK LOT #[id number]  . KNEE ARTHROSCOPY Left 1992  . PAIN PUMP IMPLANTATION  2012  . stem cell implant  2008   [facility]    Family History  Problem Relation Age of Onset  . Cancer Father     throat  . Kidney disease Sister   . Stroke    . Bladder Cancer Neg Hx   . Prostate cancer Neg Hx     Social History:  reports that he has quit smoking. His smoking use included Cigarettes. He has a 30.00 pack-year smoking history. He has quit using smokeless tobacco. He   reports that he does not drink alcohol. His drug history is not on file.  His wife's name is Marsha.  The patient is accompanied by his wife today.  Allergies:  Allergies  Allergen Reactions  . Azithromycin Diarrhea    Possible cause of C. Diff Possible cause of C. Diff Possible cause of C. Diff Possible cause of C. Diff Possible cause of C. Diff  . 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 Rivaroxaban Rash    Current Medications: Current Outpatient Prescriptions  Medication Sig Dispense Refill  . acetaminophen (TYLENOL) 500 MG tablet Take 1,000 mg by mouth.     . ALPRAZolam (XANAX) 0.25 MG tablet TAKE 1 TABLET BY MOUTH EVERY NIGHT AT BEDTIME AS NEEDED FOR ANXIETY. 30 tablet 3  . baclofen (LIORESAL) 10 MG tablet Take 1 tablet (10 mg total) by mouth at bedtime. 30 each 3  . calcium citrate-vitamin D (CITRACAL+D) 315-200 MG-UNIT per tablet Take 1 tablet by mouth daily.     . cetirizine (ZYRTEC) 10 MG tablet Take 10 mg by mouth daily.     . Cholecalciferol (VITAMIN D3) 2000 UNITS capsule Take 2,000 Units by mouth daily.     . Cyanocobalamin (RA VITAMIN B-12 TR) 1000 MCG TBCR Take 1,000 mcg by mouth daily.     . Daratumumab (DARZALEX IV) Inject 1,200 mg into the vein every 30 (thirty) days.     . dexamethasone (DECADRON) 4 MG tablet TAKE 4 TABLETS BY MOUTH ON THE DAY OF TREATMENT AND 1 TABLET BY MOUTH ON THE DAY AFTER TREATMENT 20 tablet 0  . diltiazem (CARDIZEM) 60 MG tablet Take 60 mg by mouth as needed.    . diltiazem (DILACOR XR) 120 MG 24 hr capsule Take 120 mg by mouth daily.    . ELIQUIS 5 MG TABS tablet TAKE 1 TABLET(5 MG) BY MOUTH TWICE DAILY 60 tablet 0  . fluticasone (FLONASE) 50 MCG/ACT nasal spray Place 1 spray into the nose daily as needed for allergies.     . Iron-Vitamins (GERITOL PO) Take by mouth.    . loperamide (IMODIUM) 2 MG capsule Take 2 mg by mouth as needed for diarrhea or loose stools. Reported on 04/26/2016    . lovastatin (MEVACOR) 20 MG tablet     . magnesium chloride (SLOW-MAG) 64 MG TBEC SR tablet Take 2 tablets (128 mg total) by mouth 4 (four) times daily. 112 tablet 0  . montelukast (SINGULAIR) 10 MG tablet TAKE 1 TABLET BY MOUTH ONCE A DAY AS DIRECTED. 30 tablet 0  . omeprazole (PRILOSEC) 20 MG capsule Take 20 mg by mouth daily.     . potassium chloride SA (K-DUR,KLOR-CON) 20 MEQ tablet TAKE 1 TABLET(20 MEQ) BY MOUTH TWICE DAILY 180 tablet 0  . prochlorperazine (COMPAZINE) 10 MG tablet Take 10 mg by mouth every 6 (six) hours as needed for nausea or vomiting.    . ranitidine (ZANTAC) 150 MG tablet Take 150 mg by mouth daily.      . tamsulosin (FLOMAX) 0.4 MG CAPS capsule Take 1 capsule (0.4 mg total) by mouth daily. 90 capsule 4  . valACYclovir (VALTREX) 500 MG tablet TAKE 1 TABLET BY MOUTH DAILY 30 tablet 0  . PAIN MANAGEMENT IT PUMP REFILL 1 each by Intrathecal route once. Medication: PF Fentanyl 1,500.0 mcg/ml PF Bupivicaine 30.0 mg/ml PF Clonidine 300.0mcg/ml Total Volume: 40   ml Needed by 09-19-16 @1000 1 each 0   No current facility-administered medications for this visit.    Facility-Administered Medications Ordered in Other Visits  Medication Dose Route Frequency Provider Last Rate Last Dose  . magnesium sulfate IVPB 2 g 50 mL  2 g Intravenous Once Melissa C Corcoran, MD        Review of Systems:  GENERAL: Feels "good".  Fatigue.  No fevers or sweats.  Weight up 3 pounds. PERFORMANCE STATUS (ECOG): 1 HEENT:  Cataracts s/p surgery.  Dry mouth.  Taste buds "shot".  No sore throat, mouth sores or tenderness. Lungs: No shortness of breath or cough.  No hemoptysis. Cardiac:  No chest pain, palpitations, orthopnea, or PND. GI:  Appetite good.  Nausea managed with Compazine.  No vomiting, diarrhea, constipation, melena or hematochezia. GU:  No urgency, frequency, dysuria, or hematuria. Musculoskeletal:  Compression fracture of back on a pain pump. No muscle tenderness. Extremities:  No pain or swelling. Skin:  Fragile skin.  Chest pruritic.  No unexplained bruises.  No rashes or skin changes. Neuro:  Little tremor (chronic).  Neuropathy in feet.  No headache, weakness, balance or coordination issues. Endocrine:  No diabetes, thyroid issues, hot flashes or night sweats. Psych:  No mood changes, depression or anxiety. Pain:  Chronic back pain (pain well managed with pump). Review of systems:  All other systems reviewed and found to be negative.   Physical Exam: Blood pressure 96/67, pulse (!) 101, temperature (!) 95.5 F (35.3 C), temperature source Tympanic, resp. rate 18, weight 144 lb 6.4 oz (65.5  kg). GENERAL:  Chronically ill appearing frail gentleman sitting comfortably in the exam room in no acute distress.  He has a rolling walker at his side. MENTAL STATUS:  Alert and oriented to person, place and time. HEAD:  Wearing a golf cap.  Temporal wasting.  Normocephalic, atraumatic, face symmetric, no Cushingoid features. EYES:  Glasses.  Blue eyes s/p cataract surgery.  Pupils equal round and reactive to light and accomodation.  No conjunctivitis or scleral icterus. ENT:  Hearing aide.  Oropharynx clear without lesion.  Hearing aide.  Tongue normal. Mucous membranes moist.  RESPIRATORY:  Clear to auscultation without rales, wheezes or rhonchi. CARDIOVASCULAR:  Regular rate and rhythm without murmur, rub or gallop. ABDOMEN:  RUQ pain pump.  Soft, non-tender, with active bowel sounds, and no hepatosplenomegaly.  No masses. SKIN:  No rashes, ulcers or lesions. EXTREMITIES: Ankle edema.  No skin discoloration or tenderness.  No palpable cords. LYMPH NODES: No palpable cervical, supraclavicular, axillary or inguinal adenopathy  NEUROLOGICAL: Intention tremor. PSYCH:  Appropriate.    Orders Only on 09/04/2016  Component Date Value Ref Range Status  . WBC 09/04/2016 9.7  3.8 - 10.6 K/uL Final  . RBC 09/04/2016 2.68* 4.40 - 5.90 MIL/uL Final  . Hemoglobin 09/04/2016 9.7* 13.0 - 18.0 g/dL Final  . HCT 09/04/2016 29.1* 40.0 - 52.0 % Final  . MCV 09/04/2016 108.6* 80.0 - 100.0 fL Final  . MCH 09/04/2016 36.2* 26.0 - 34.0 pg Final  . MCHC 09/04/2016 33.3  32.0 - 36.0 g/dL Final  . RDW 09/04/2016 24.6* 11.5 - 14.5 % Final  . Platelets 09/04/2016 129* 150 - 440 K/uL Final  . Neutrophils Relative % 09/04/2016 72  % Final  . Neutro Abs 09/04/2016 7.0* 1.4 - 6.5 K/uL Final  . Lymphocytes Relative 09/04/2016 12  % Final  . Lymphs Abs 09/04/2016 1.2  1.0 - 3.6 K/uL Final  . Monocytes Relative 09/04/2016   12  % Final  . Monocytes Absolute 09/04/2016 1.1* 0.2 - 1.0 K/uL Final  . Eosinophils  Relative 09/04/2016 3  % Final  . Eosinophils Absolute 09/04/2016 0.3  0 - 0.7 K/uL Final  . Basophils Relative 09/04/2016 1  % Final  . Basophils Absolute 09/04/2016 0.1  0 - 0.1 K/uL Final  . Sodium 09/04/2016 139  135 - 145 mmol/L Final  . Potassium 09/04/2016 4.2  3.5 - 5.1 mmol/L Final  . Chloride 09/04/2016 109  101 - 111 mmol/L Final  . CO2 09/04/2016 22  22 - 32 mmol/L Final  . Glucose, Bld 09/04/2016 100* 65 - 99 mg/dL Final  . BUN 09/04/2016 10  6 - 20 mg/dL Final  . Creatinine, Ser 09/04/2016 1.08  0.61 - 1.24 mg/dL Final  . Calcium 09/04/2016 8.6* 8.9 - 10.3 mg/dL Final  . Total Protein 09/04/2016 5.6* 6.5 - 8.1 g/dL Final  . Albumin 09/04/2016 3.0* 3.5 - 5.0 g/dL Final  . AST 09/04/2016 22  15 - 41 U/L Final  . ALT 09/04/2016 12* 17 - 63 U/L Final  . Alkaline Phosphatase 09/04/2016 86  38 - 126 U/L Final  . Total Bilirubin 09/04/2016 0.5  0.3 - 1.2 mg/dL Final  . GFR calc non Af Amer 09/04/2016 >60  >60 mL/min Final  . GFR calc Af Amer 09/04/2016 >60  >60 mL/min Final   Comment: (NOTE) The eGFR has been calculated using the CKD EPI equation. This calculation has not been validated in all clinical situations. eGFR's persistently <60 mL/min signify possible Chronic Kidney Disease.   . Anion gap 09/04/2016 8  5 - 15 Final  . Blood Bank Specimen 09/04/2016 SAMPLE AVAILABLE FOR TESTING   Final  . Sample Expiration 09/04/2016 09/07/2016   Final  . Magnesium 09/04/2016 1.5* 1.7 - 2.4 mg/dL Final    Assessment:  Logan Perez is a 70 y.o. male with stage III mutiple myeloma.  He initially presented with progressive back pain beginning in 12/2006.  MRI revealed "spots and compression fractures".  He began Velcade, thalidomide, and Decadron.  In 08/2007, he underwent high dose chemotherapy and autologous stem cell transplant.  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  He recurred with a rising M-spike (2.7) with repeat M spike (1.7 gm/dl) in  03/2010.  He was initially treated with Velcade (02/08/2010 - 05/10/2010).  He then began Revlimid (15 mg 3 weeks on/1 week off) and Decadron (40 mg on day 1, 8, 15, 22).  Because of significant side effect with Decadron his dose was decreased to 10 mg once a week in 07/2010.    He was on maintenance Revlimid. Revlimid was initially10 mg 3 weeks on/1 week off. This was changed to 10 mg 2 weeks on/2 weeks off secondary to right nipple tenderness. His dose was increased to 10 mg 3 weeks on/1 week off with Decadron 10 mg a week (on Sundays) and then Revlamid 15 mg 3 weeks on and 1 week off with Decadron on Sundays.  He began Pomalyst 4 mg 3 weeks on/1 week off with Decadron on 08/27/2015.  Over the past year his SPEP has revealed no monoclonal protein (04/21/2015) and 0.5 gm/dL on 09/22/2015 and 10/20/2015. M spike was 0.1 on 02/02/2016, 03/01/2016, 03/29/2016, 04/26/2016, and 05/24/2016.  Free light chains have been monitored. Kappa free light chains were 18.54 on 11/21/2013, 18.37 on 02/20/2014, 18.93 on 05/22/2014, 32.58 (high; normal ratio 1.27) on 08/21/2014, 51.53 (high; elevated ratio of 2.12) on 11/13/2014,   28.08 (ratio 1.73) on 12/09/2014, 23.71 (ratio 2.17) on 01/18/2015, 92.93 (ratio 9.49) on 04/21/2015, 93.44 (ratio 10.28) on 05/26/2015, 255.45 (ratio of 24.05) on 07/14/2015, 373.89 (ratio 48.31) on 08/04/2015, 474.33 (ratio 70.58) on 08/25/2015, 450.76 (ratio 55.44) on 09/22/2015, 453.4 (ratio 59.89) on 10/20/2015, 58.26 (ratio of > 40.74) on 02/02/2016, 62.67 (ratio of 37.98) on 03/01/2016, 75.7 (ratio > 50.47) on 03/29/2016, 79.7 (ratio 53.13) on 04/26/2016, and 108.6 (ratio > 72.4) on 05/24/2016.  Bone survey on 12/08/2014 was stable.  Bone survey on 10/21/2015 revealed increase conspicuity of subcentimeter lytic lesions in the calvarium.  Bone marrow aspirate and biopsy on 11/04/2015 revealed an atypical monoclonal plasma cells estimated at 30-40% of marrow cells.   Marrow was variably  cellular (approximately 45%) with background trilineage hematopoiesis. There was no significant increase in marrow reticulin fibers. Storage iron was present.    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 He develoed herpes zoster in 04/2008. He developed a pulmonary embolism in 05/2013. He was initially on Xarelto, but is now on Eliquis. He had an episode of pneumonia around this time requiring a brief admission. He developed severe lower leg cramps on 08/07/2014 secondary to hypokalemia. Duplex was negative.   He was treated for C difficile colitis (Flagyl completed 07/30/2015).  He has a chronic indwelling pain pump.  He received 4 cycles of Pomalyst and Decadron (08/27/2015 - 11/19/2015).  Restaging studies document progressive disease.  Kappa free light chains are increasing.  SPEP revealed 0.5 gm/dL monoclonal protein then 1.3 gm/dL.  Bone survey reveals increase conspicuity of subcentimeter lytic lesions in the calvarium.  Bone marrow reveals 30-40% plasma cells.   MUGA on 11/30/2015 revealed an ejection fraction of 46%.  He is felt not to be a good candidate for Kyprolis.  There were no focal wall motion abnormalities.  He had a stress echo less than 1 year ago.  He has a history of PVCs and atrial fibrillation.  He takes Cardizem prn.  He is s/p 17 weeks of daratumumab (Darzalex) (12/09/2015 - 05/25/2016).  He tolerated treatment well without side effect.  Bone marrow on 02/09/2016 revealed no diagnostic morphologic evidence of plasma cell myeloma.  Marrow was normocellular to hypocellular marrow for age (ranging from 10-40%) with maturing trilineage hematopoiesis and mild multilineage dyspoiesis.  There was patchy mild increase in reticulin.  Storage iron was present.  Flow cytometry revealed no definitive evidence of monoclonality.  There was a non-specific atypical myeloid and monocytic findings with no increase in blasts.   Cytogenetics were normal (46, XY).  He is currently day 77 s/p 2nd autologous stem cell transplant on 06/16/2016.  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  He also experienced atrial fibrillation with intermittent episodes of RVR requiring IV beta blockers.  He is on prophylactic valacyclovir for 1 year post transplant.  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  Work-up on 08/23/2016 revealed the following normal studies:  B12 and folate.  Ferritin was 1379 (high).  Iron studies included a saturation of 56% and TIBC 141 (low).  ESR was 64.  Thrombocytopenia is improving slowly post transplant.  Platelet count was 129,000 yesterday.  Symptomatically, he feels a little better.  He is eating better.  He has gained 2 pounds in the past 2 weeks.  He has occasional nausea. Magnesium is 1.5.  Plan:  1.  Review interim labs.  Discuss improvement in counts.  2.  Discuss hypomagnesemia and need for replacement.  Avoid oral magnesium secondary to diarrhea. 3.  Magnesium 2 gm IV today. 4.  Discuss xerostomia.  Patient to try sample of SalivaMax. 5.  RTC on 09/12/2016 for labs (CBC with diff, BMP, Mg) with MD visit in [place] on 09/13/2016.   Melissa C Corcoran, MD  09/05/2016, 11:27 AM  

## 2016-09-05 NOTE — Progress Notes (Signed)
Patient states he gets nauseated in the afternoons.  Also BP decreased today.  Sitting 96/67 /hr 101.  Standing 86/46 HR 109

## 2016-09-06 ENCOUNTER — Ambulatory Visit: Payer: Medicare Other | Admitting: Hematology and Oncology

## 2016-09-06 ENCOUNTER — Other Ambulatory Visit: Payer: Medicare Other

## 2016-09-12 ENCOUNTER — Telehealth: Payer: Self-pay | Admitting: *Deleted

## 2016-09-12 ENCOUNTER — Inpatient Hospital Stay: Payer: Medicare Other | Attending: Hematology and Oncology

## 2016-09-12 DIAGNOSIS — Z87311 Personal history of (healed) other pathological fracture: Secondary | ICD-10-CM | POA: Insufficient documentation

## 2016-09-12 DIAGNOSIS — C9002 Multiple myeloma in relapse: Secondary | ICD-10-CM | POA: Diagnosis not present

## 2016-09-12 DIAGNOSIS — D649 Anemia, unspecified: Secondary | ICD-10-CM

## 2016-09-12 DIAGNOSIS — Z800 Family history of malignant neoplasm of digestive organs: Secondary | ICD-10-CM | POA: Insufficient documentation

## 2016-09-12 DIAGNOSIS — D696 Thrombocytopenia, unspecified: Secondary | ICD-10-CM | POA: Diagnosis not present

## 2016-09-12 DIAGNOSIS — D638 Anemia in other chronic diseases classified elsewhere: Secondary | ICD-10-CM | POA: Diagnosis not present

## 2016-09-12 DIAGNOSIS — Z8739 Personal history of other diseases of the musculoskeletal system and connective tissue: Secondary | ICD-10-CM | POA: Insufficient documentation

## 2016-09-12 DIAGNOSIS — N400 Enlarged prostate without lower urinary tract symptoms: Secondary | ICD-10-CM | POA: Diagnosis not present

## 2016-09-12 DIAGNOSIS — R5383 Other fatigue: Secondary | ICD-10-CM | POA: Diagnosis not present

## 2016-09-12 DIAGNOSIS — K117 Disturbances of salivary secretion: Secondary | ICD-10-CM | POA: Insufficient documentation

## 2016-09-12 DIAGNOSIS — I4891 Unspecified atrial fibrillation: Secondary | ICD-10-CM | POA: Diagnosis not present

## 2016-09-12 DIAGNOSIS — Z86711 Personal history of pulmonary embolism: Secondary | ICD-10-CM | POA: Insufficient documentation

## 2016-09-12 DIAGNOSIS — Z79899 Other long term (current) drug therapy: Secondary | ICD-10-CM | POA: Insufficient documentation

## 2016-09-12 DIAGNOSIS — G8929 Other chronic pain: Secondary | ICD-10-CM | POA: Diagnosis not present

## 2016-09-12 DIAGNOSIS — E440 Moderate protein-calorie malnutrition: Secondary | ICD-10-CM | POA: Diagnosis not present

## 2016-09-12 DIAGNOSIS — R197 Diarrhea, unspecified: Secondary | ICD-10-CM | POA: Diagnosis not present

## 2016-09-12 DIAGNOSIS — R110 Nausea: Secondary | ICD-10-CM | POA: Diagnosis not present

## 2016-09-12 DIAGNOSIS — Z7901 Long term (current) use of anticoagulants: Secondary | ICD-10-CM | POA: Insufficient documentation

## 2016-09-12 DIAGNOSIS — Z87891 Personal history of nicotine dependence: Secondary | ICD-10-CM | POA: Insufficient documentation

## 2016-09-12 DIAGNOSIS — I493 Ventricular premature depolarization: Secondary | ICD-10-CM | POA: Insufficient documentation

## 2016-09-12 DIAGNOSIS — Z8673 Personal history of transient ischemic attack (TIA), and cerebral infarction without residual deficits: Secondary | ICD-10-CM | POA: Insufficient documentation

## 2016-09-12 DIAGNOSIS — L298 Other pruritus: Secondary | ICD-10-CM | POA: Insufficient documentation

## 2016-09-12 DIAGNOSIS — Z9484 Stem cells transplant status: Secondary | ICD-10-CM | POA: Diagnosis not present

## 2016-09-12 DIAGNOSIS — R682 Dry mouth, unspecified: Secondary | ICD-10-CM | POA: Insufficient documentation

## 2016-09-12 DIAGNOSIS — I10 Essential (primary) hypertension: Secondary | ICD-10-CM | POA: Insufficient documentation

## 2016-09-12 DIAGNOSIS — M5489 Other dorsalgia: Secondary | ICD-10-CM | POA: Insufficient documentation

## 2016-09-12 DIAGNOSIS — E785 Hyperlipidemia, unspecified: Secondary | ICD-10-CM | POA: Diagnosis not present

## 2016-09-12 DIAGNOSIS — K219 Gastro-esophageal reflux disease without esophagitis: Secondary | ICD-10-CM | POA: Diagnosis not present

## 2016-09-12 DIAGNOSIS — C9000 Multiple myeloma not having achieved remission: Secondary | ICD-10-CM

## 2016-09-12 LAB — MAGNESIUM: Magnesium: 1.2 mg/dL — ABNORMAL LOW (ref 1.7–2.4)

## 2016-09-12 LAB — CBC WITH DIFFERENTIAL/PLATELET
Basophils Absolute: 0.1 10*3/uL (ref 0–0.1)
Basophils Relative: 1 %
Eosinophils Absolute: 0.3 10*3/uL (ref 0–0.7)
Eosinophils Relative: 4 %
HCT: 28.6 % — ABNORMAL LOW (ref 40.0–52.0)
Hemoglobin: 9.5 g/dL — ABNORMAL LOW (ref 13.0–18.0)
Lymphocytes Relative: 13 %
Lymphs Abs: 1 10*3/uL (ref 1.0–3.6)
MCH: 36.4 pg — ABNORMAL HIGH (ref 26.0–34.0)
MCHC: 33.2 g/dL (ref 32.0–36.0)
MCV: 109.7 fL — ABNORMAL HIGH (ref 80.0–100.0)
Monocytes Absolute: 1 10*3/uL (ref 0.2–1.0)
Monocytes Relative: 14 %
Neutro Abs: 5.2 10*3/uL (ref 1.4–6.5)
Neutrophils Relative %: 68 %
Platelets: 141 10*3/uL — ABNORMAL LOW (ref 150–440)
RBC: 2.6 MIL/uL — ABNORMAL LOW (ref 4.40–5.90)
RDW: 22.8 % — ABNORMAL HIGH (ref 11.5–14.5)
WBC: 7.6 10*3/uL (ref 3.8–10.6)

## 2016-09-12 LAB — COMPREHENSIVE METABOLIC PANEL
ALT: 11 U/L — ABNORMAL LOW (ref 17–63)
AST: 18 U/L (ref 15–41)
Albumin: 3.1 g/dL — ABNORMAL LOW (ref 3.5–5.0)
Alkaline Phosphatase: 65 U/L (ref 38–126)
Anion gap: 8 (ref 5–15)
BUN: 16 mg/dL (ref 6–20)
CO2: 24 mmol/L (ref 22–32)
Calcium: 8.5 mg/dL — ABNORMAL LOW (ref 8.9–10.3)
Chloride: 108 mmol/L (ref 101–111)
Creatinine, Ser: 1.47 mg/dL — ABNORMAL HIGH (ref 0.61–1.24)
GFR calc Af Amer: 54 mL/min — ABNORMAL LOW (ref >60)
GFR calc non Af Amer: 47 mL/min — ABNORMAL LOW (ref >60)
Glucose, Bld: 85 mg/dL (ref 65–99)
Potassium: 4.2 mmol/L (ref 3.5–5.1)
Sodium: 140 mmol/L (ref 135–145)
Total Bilirubin: 0.5 mg/dL (ref 0.3–1.2)
Total Protein: 5.6 g/dL — ABNORMAL LOW (ref 6.5–8.1)

## 2016-09-12 NOTE — Telephone Encounter (Signed)
-----   Message from Lequita Asal, MD sent at 09/12/2016 12:23 PM EST ----- Regarding: Please call patient  Creatinine up.  Drinking fluids?  Diarrhea?  Magnesium low.  Needs IVF and IV magnesium.  M ----- Message ----- From: Interface, Lab In [place] Sent: 09/12/2016   9:58 AM To: Lequita Asal, MD

## 2016-09-12 NOTE — Telephone Encounter (Signed)
Called pt to let him know that mag low and also his creat elevated.  Wanted to see if he has been drinking good or less than he has been and to see if he could come in for mag.  He states he could do better on the drinking, his wife states he has been slacking some.  Also he has the loose form stools every day 3 a day and it has been this way for at least 5 months. It was before he had transplant.  He is agreeable to come in tom for fluids and mag.  He is feeling much stronger and waiting for his therapist to let him be  On a cane this week.  He already had appt to see corcoran in office and we added on fluids and mag after the md visit. Pt aware

## 2016-09-13 ENCOUNTER — Inpatient Hospital Stay: Payer: Medicare Other

## 2016-09-13 ENCOUNTER — Inpatient Hospital Stay (HOSPITAL_BASED_OUTPATIENT_CLINIC_OR_DEPARTMENT_OTHER): Payer: Medicare Other | Admitting: Hematology and Oncology

## 2016-09-13 ENCOUNTER — Encounter: Payer: Self-pay | Admitting: Hematology and Oncology

## 2016-09-13 VITALS — BP 103/72 | HR 101 | Temp 96.3°F | Resp 18 | Wt 145.5 lb

## 2016-09-13 DIAGNOSIS — Z86711 Personal history of pulmonary embolism: Secondary | ICD-10-CM

## 2016-09-13 DIAGNOSIS — Z87891 Personal history of nicotine dependence: Secondary | ICD-10-CM

## 2016-09-13 DIAGNOSIS — R5383 Other fatigue: Secondary | ICD-10-CM

## 2016-09-13 DIAGNOSIS — R110 Nausea: Secondary | ICD-10-CM

## 2016-09-13 DIAGNOSIS — N400 Enlarged prostate without lower urinary tract symptoms: Secondary | ICD-10-CM

## 2016-09-13 DIAGNOSIS — K117 Disturbances of salivary secretion: Secondary | ICD-10-CM

## 2016-09-13 DIAGNOSIS — C9002 Multiple myeloma in relapse: Secondary | ICD-10-CM

## 2016-09-13 DIAGNOSIS — I4891 Unspecified atrial fibrillation: Secondary | ICD-10-CM

## 2016-09-13 DIAGNOSIS — G8929 Other chronic pain: Secondary | ICD-10-CM

## 2016-09-13 DIAGNOSIS — L298 Other pruritus: Secondary | ICD-10-CM

## 2016-09-13 DIAGNOSIS — Z8673 Personal history of transient ischemic attack (TIA), and cerebral infarction without residual deficits: Secondary | ICD-10-CM

## 2016-09-13 DIAGNOSIS — D638 Anemia in other chronic diseases classified elsewhere: Secondary | ICD-10-CM

## 2016-09-13 DIAGNOSIS — D696 Thrombocytopenia, unspecified: Secondary | ICD-10-CM | POA: Diagnosis not present

## 2016-09-13 DIAGNOSIS — Z8739 Personal history of other diseases of the musculoskeletal system and connective tissue: Secondary | ICD-10-CM

## 2016-09-13 DIAGNOSIS — Z9484 Stem cells transplant status: Secondary | ICD-10-CM | POA: Diagnosis not present

## 2016-09-13 DIAGNOSIS — M5489 Other dorsalgia: Secondary | ICD-10-CM

## 2016-09-13 DIAGNOSIS — I10 Essential (primary) hypertension: Secondary | ICD-10-CM

## 2016-09-13 DIAGNOSIS — C9000 Multiple myeloma not having achieved remission: Secondary | ICD-10-CM

## 2016-09-13 DIAGNOSIS — Z7901 Long term (current) use of anticoagulants: Secondary | ICD-10-CM

## 2016-09-13 DIAGNOSIS — Z79899 Other long term (current) drug therapy: Secondary | ICD-10-CM

## 2016-09-13 DIAGNOSIS — Z87311 Personal history of (healed) other pathological fracture: Secondary | ICD-10-CM

## 2016-09-13 DIAGNOSIS — R682 Dry mouth, unspecified: Secondary | ICD-10-CM

## 2016-09-13 DIAGNOSIS — Z800 Family history of malignant neoplasm of digestive organs: Secondary | ICD-10-CM

## 2016-09-13 DIAGNOSIS — K219 Gastro-esophageal reflux disease without esophagitis: Secondary | ICD-10-CM

## 2016-09-13 DIAGNOSIS — E440 Moderate protein-calorie malnutrition: Secondary | ICD-10-CM

## 2016-09-13 DIAGNOSIS — E785 Hyperlipidemia, unspecified: Secondary | ICD-10-CM

## 2016-09-13 MED ORDER — MAGNESIUM SULFATE 50 % IJ SOLN
Freq: Once | INTRAMUSCULAR | Status: AC
  Administered 2016-09-13: 12:00:00 via INTRAVENOUS
  Filled 2016-09-13: qty 100

## 2016-09-13 MED ORDER — MAGNESIUM SULFATE 2 GM/50ML IV SOLN
2.0000 g | Freq: Once | INTRAVENOUS | Status: DC
Start: 2016-09-13 — End: 2016-09-13

## 2016-09-13 MED ORDER — SODIUM CHLORIDE 0.9 % IV SOLN
Freq: Once | INTRAVENOUS | Status: AC
  Administered 2016-09-13: 11:00:00 via INTRAVENOUS
  Filled 2016-09-13: qty 1000

## 2016-09-13 NOTE — Progress Notes (Signed)
Patient states he has at least 3 loose stools per day.  States it is not diarrhea but very loose.

## 2016-09-13 NOTE — Progress Notes (Signed)
[facility] day:  09/13/16  Chief Complaint: Logan Perez is a 70 y.o. 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  HPI: The patient was last seen in the medical oncology clinic on 09/05/2016.  At that time, he was feeling better.  His appetite was good.  He had gained 2 pounds.  Counts were improving.  Magnesium was low (1.5).  He received 2 gm of IV magnesium.  During the interim, he has felt pretty good.  He is having 3 bowel movements a day which are loose and " part of me".  He took Imodium. He has nausea in the afternoon secondary to orange juice. He is drinking about 12 ounces a day.  I states that spicy foods are "fantastic" (he can taste them).   Past Medical History:  Diagnosis Date  . Anxiety   . Atrial fibrillation ([place])   . BPH (benign prostatic hyperplasia)   . Complication of anesthesia    BAD HEADACHE NIGHT OF FIRST CATARACT  . Difficulty voiding   . Dysrhythmia    A FIB  . Elevated PSA   . GERD (gastroesophageal reflux disease)   . HLD (hyperlipidemia)   . HOH (hard of hearing)   . Hypertension   . Multiple myeloma ([place])   . Neuropathy ([place])   . Pain    BACK  . Palpitations   . Pneumonia   . Pulmonary embolism ([place])   . Stroke [facility])    TIA    Past Surgical History:  Procedure Laterality Date  . BACK SURGERY  1994  . CATARACT EXTRACTION W/PHACO Right 05/02/2016   Procedure: CATARACT EXTRACTION PHACO AND INTRAOCULAR LENS PLACEMENT (IOC);  Surgeon: Birder Robson, MD;  Location: [facility] ORS;  Service: Ophthalmology;  Laterality: Right;  [place] 1.06 AP% 20.6 CDE 13.70 FLUID PACK LOT # [id number]  . CATARACT EXTRACTION W/PHACO Left 05/16/2016   Procedure: CATARACT EXTRACTION PHACO AND INTRAOCULAR LENS PLACEMENT (IOC);  Surgeon: Birder Robson, MD;  Location: [facility] ORS;  Service: Ophthalmology;   Laterality: Left;  [place] 01:43 AP% 19.8 CDE 20.45 FLUID PACK LOT #[id number] H  . KNEE ARTHROSCOPY Left 1992  . PAIN PUMP IMPLANTATION  2012  . stem cell implant  2008   [facility]    Family History  Problem Relation Age of Onset  . Cancer Father     throat  . Kidney disease Sister   . Stroke    . Bladder Cancer Neg Hx   . Prostate cancer Neg Hx     Social History:  reports that he has quit smoking. His smoking use included Cigarettes. He has a 30.00 pack-year smoking history. He has quit using smokeless tobacco. He reports that he does not drink alcohol. His drug history is not on file.  His wife's name is [name].  The patient is accompanied by his wife today.  Allergies:  Allergies  Allergen Reactions  . Azithromycin Diarrhea    Possible cause of C. Diff Possible cause of C. Diff Possible cause of C. Diff Possible cause of C. Diff Possible cause of C. Diff  . 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 Rivaroxaban Rash    Current Medications: Current Outpatient Prescriptions  Medication Sig  Dispense Refill  . acetaminophen (TYLENOL) 500 MG tablet Take 1,000 mg by mouth.    . ALPRAZolam (XANAX) 0.25 MG tablet TAKE 1 TABLET BY MOUTH EVERY NIGHT AT BEDTIME AS NEEDED FOR ANXIETY. 30 tablet 3  . baclofen (LIORESAL) 10 MG tablet Take 1 tablet (10 mg total) by mouth at bedtime. 30 each 3  . calcium citrate-vitamin D (CITRACAL+D) 315-200 MG-UNIT per tablet Take 1 tablet by mouth daily.     . cetirizine (ZYRTEC) 10 MG tablet Take 10 mg by mouth daily.     . Cholecalciferol (VITAMIN D3) 2000 UNITS capsule Take 2,000 Units by mouth daily.     . Cyanocobalamin (RA VITAMIN B-12 TR) 1000 MCG TBCR Take 1,000 mcg by mouth daily.     . Daratumumab (DARZALEX IV) Inject 1,200 mg into the vein every 30 (thirty) days.     [name]  dexamethasone (DECADRON) 4 MG tablet TAKE 4 TABLETS BY MOUTH ON THE DAY OF TREATMENT AND 1 TABLET BY MOUTH ON THE DAY AFTER TREATMENT 20 tablet 0  . diltiazem (CARDIZEM) 60 MG tablet Take 60 mg by mouth as needed.    . diltiazem (DILACOR XR) 120 MG 24 hr capsule Take 120 mg by mouth daily.    [name] ELIQUIS 5 MG TABS tablet TAKE 1 TABLET(5 MG) BY MOUTH TWICE DAILY 60 tablet 0  . fluticasone (FLONASE) 50 MCG/ACT nasal spray Place 1 spray into the nose daily as needed for allergies.     . Iron-Vitamins (GERITOL PO) Take by mouth.    . loperamide (IMODIUM) 2 MG capsule Take 2 mg by mouth as needed for diarrhea or loose stools. Reported on 04/26/2016    . lovastatin (MEVACOR) 20 MG tablet     . magnesium chloride (SLOW-MAG) 64 MG TBEC SR tablet Take 2 tablets (128 mg total) by mouth 4 (four) times daily. 112 tablet 0  . montelukast (SINGULAIR) 10 MG tablet TAKE 1 TABLET BY MOUTH ONCE A DAY AS DIRECTED. 30 tablet 0  . omeprazole (PRILOSEC) 20 MG capsule Take 20 mg by mouth daily.     . potassium chloride SA (K-DUR,KLOR-CON) 20 MEQ tablet TAKE 1 TABLET(20 MEQ) BY MOUTH TWICE DAILY 180 tablet 0  . prochlorperazine (COMPAZINE) 10 MG tablet Take 10 mg by mouth every 6 (six) hours as needed for nausea or vomiting.    . ranitidine (ZANTAC) 150 MG tablet Take 150 mg by mouth daily.     . tamsulosin (FLOMAX) 0.4 MG CAPS capsule Take 1 capsule (0.4 mg total) by mouth daily. 90 capsule 4  . valACYclovir (VALTREX) 500 MG tablet TAKE 1 TABLET BY MOUTH DAILY 30 tablet 0  . PAIN MANAGEMENT IT PUMP REFILL 1 each by Intrathecal route once. Medication: PF Fentanyl 1,500.0 mcg/ml PF Bupivicaine 30.0 mg/ml PF Clonidine 300.5mg/ml Total Volume: 40 ml Needed by 09-19-16 '@1000'$  1 each 0   No current facility-administered medications for this visit.    Facility-Administered Medications Ordered in Other Visits  Medication Dose Route Frequency Provider Last Rate Last Dose  . magnesium sulfate IVPB 2 g 50 mL  2 g Intravenous  Once MLequita Asal MD        Review of Systems:  GENERAL: Feels "pretty good".  Fatigue.  No fevers or sweats.  Weight up 1 pound. PERFORMANCE STATUS (ECOG): 1 HEENT:  Cataracts s/p surgery.  Dry mouth.  Taste buds "shot".  Can taste spicy foods.  No sore throat, mouth sores or tenderness. Lungs:  No shortness of breath or  cough.  No hemoptysis. Cardiac:  No chest pain, palpitations, orthopnea, or PND. GI:  Appetite good.  Nausea in afternoon managed with Compazine.  3 loose BM/day (took Imodium yesterday).  No vomiting, constipation, melena or hematochezia. GU:  No urgency, frequency, dysuria, or hematuria. Musculoskeletal:  Compression fracture of back on a pain pump. No muscle tenderness. Extremities:  No pain or swelling. Skin:  Fragile skin.  Chest pruritic.  No unexplained bruises.  No rashes or skin changes. Neuro:  Little tremor (chronic).  Neuropathy in feet.  No headache, weakness, balance or coordination issues. Endocrine:  No diabetes, thyroid issues, hot flashes or night sweats. Psych:  No mood changes, depression or anxiety. Pain:  Chronic back pain (pain well managed with pump). Review of systems:  All other systems reviewed and found to be negative.   Physical Exam: Blood pressure 103/72, pulse (!) 101, temperature (!) 96.3 F (35.7 C), temperature source Tympanic, resp. rate 18, weight 145 lb 8.1 oz (66 kg). GENERAL:  Thin elderly gentleman sitting comfortably in the exam room in no acute distress.  He has a rolling walker at his side. MENTAL STATUS:  Alert and oriented to person, place and time. HEAD:  Wearing a cap.  Temporal wasting.  Normocephalic, atraumatic, face symmetric, no Cushingoid features. EYES:  Glasses.  Blue eyes s/p cataract surgery.  Pupils equal round and reactive to light and accomodation.  No conjunctivitis or scleral icterus. ENT:  Hearing aide.  Oropharynx clear without lesion.  Hearing aide.  Tongue normal. Mucous membranes moist.   RESPIRATORY:  Clear to auscultation without rales, wheezes or rhonchi. CARDIOVASCULAR:  Regular rate and rhythm without murmur, rub or gallop. ABDOMEN:  RUQ pain pump.  Soft, non-tender, with active bowel sounds, and no hepatosplenomegaly.  No masses. SKIN:  No rashes, ulcers or lesions. EXTREMITIES: Ankle edema.  No skin discoloration or tenderness.  No palpable cords. LYMPH NODES: No palpable cervical, supraclavicular, axillary or inguinal adenopathy  NEUROLOGICAL: Intention tremor. PSYCH:  Appropriate.    Appointment on 09/12/2016  Component Date Value Ref Range Status  . Sodium 09/12/2016 140  135 - 145 mmol/L Final  . Potassium 09/12/2016 4.2  3.5 - 5.1 mmol/L Final  . Chloride 09/12/2016 108  101 - 111 mmol/L Final  . CO2 09/12/2016 24  22 - 32 mmol/L Final  . Glucose, Bld 09/12/2016 85  65 - 99 mg/dL Final  . BUN 09/12/2016 16  6 - 20 mg/dL Final  . Creatinine, Ser 09/12/2016 1.47* 0.61 - 1.24 mg/dL Final  . Calcium 09/12/2016 8.5* 8.9 - 10.3 mg/dL Final  . Total Protein 09/12/2016 5.6* 6.5 - 8.1 g/dL Final  . Albumin 09/12/2016 3.1* 3.5 - 5.0 g/dL Final  . AST 09/12/2016 18  15 - 41 U/L Final  . ALT 09/12/2016 11* 17 - 63 U/L Final  . Alkaline Phosphatase 09/12/2016 65  38 - 126 U/L Final  . Total Bilirubin 09/12/2016 0.5  0.3 - 1.2 mg/dL Final  . GFR calc non Af Amer 09/12/2016 47* >60 mL/min Final  . GFR calc Af Amer 09/12/2016 54* >60 mL/min Final   Comment: (NOTE) The eGFR has been calculated using the CKD EPI equation. This calculation has not been validated in all clinical situations. eGFR's persistently <60 mL/min signify possible Chronic Kidney Disease.   . Anion gap 09/12/2016 8  5 - 15 Final  . Magnesium 09/12/2016 1.2* 1.7 - 2.4 mg/dL Final  . WBC 09/12/2016 7.6  3.8 - 10.6 K/uL Final  .  RBC 09/12/2016 2.60* 4.40 - 5.90 MIL/uL Final  . Hemoglobin 09/12/2016 9.5* 13.0 - 18.0 g/dL Final  . HCT 09/12/2016 28.6* 40.0 - 52.0 % Final  . MCV 09/12/2016 109.7*  80.0 - 100.0 fL Final  . MCH 09/12/2016 36.4* 26.0 - 34.0 pg Final  . MCHC 09/12/2016 33.2  32.0 - 36.0 g/dL Final  . RDW 09/12/2016 22.8* 11.5 - 14.5 % Final  . Platelets 09/12/2016 141* 150 - 440 K/uL Final  . Neutrophils Relative % 09/12/2016 68  % Final  . Neutro Abs 09/12/2016 5.2  1.4 - 6.5 K/uL Final  . Lymphocytes Relative 09/12/2016 13  % Final  . Lymphs Abs 09/12/2016 1.0  1.0 - 3.6 K/uL Final  . Monocytes Relative 09/12/2016 14  % Final  . Monocytes Absolute 09/12/2016 1.0  0.2 - 1.0 K/uL Final  . Eosinophils Relative 09/12/2016 4  % Final  . Eosinophils Absolute 09/12/2016 0.3  0 - 0.7 K/uL Final  . Basophils Relative 09/12/2016 1  % Final  . Basophils Absolute 09/12/2016 0.1  0 - 0.1 K/uL Final    Assessment:  Logan Perez is a 70 y.o. male with stage III mutiple myeloma.  He initially presented with progressive back pain beginning in 12/2006.  MRI revealed "spots and compression fractures".  He began Velcade, thalidomide, and Decadron.  In 08/2007, he underwent high dose chemotherapy and autologous stem cell transplant.  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  He recurred with a rising M-spike (2.7) with repeat M spike (1.7 gm/dl) in 03/2010.  He was initially treated with Velcade (02/08/2010 - 05/10/2010).  He then began Revlimid (15 mg 3 weeks on/1 week off) and Decadron (40 mg on day 1, 8, 15, 22).  Because of significant side effect with Decadron his dose was decreased to 10 mg once a week in 07/2010.    He was on maintenance Revlimid. Revlimid was initially10 mg 3 weeks on/1 week off. This was changed to 10 mg 2 weeks on/2 weeks off secondary to right nipple tenderness. His dose was increased to 10 mg 3 weeks on/1 week off with Decadron 10 mg a week (on Sundays) and then Revlamid 15 mg 3 weeks on and 1 week off with Decadron on Sundays.  He began Pomalyst 4 mg 3 weeks on/1 week off with Decadron on 08/27/2015.  Over the past year his  SPEP has revealed no monoclonal protein (04/21/2015) and 0.5 gm/dL on 09/22/2015 and 10/20/2015. M spike was 0.1 on 02/02/2016, 03/01/2016, 03/29/2016, 04/26/2016, and 05/24/2016.  Free light chains have been monitored. Kappa free light chains were 18.54 on 11/21/2013, 18.37 on 02/20/2014, 18.93 on 05/22/2014, 32.58 (high; normal ratio 1.27) on 08/21/2014, 51.53 (high; elevated ratio of 2.12) on 11/13/2014, 28.08 (ratio 1.73) on 12/09/2014, 23.71 (ratio 2.17) on 01/18/2015, 92.93 (ratio 9.49) on 04/21/2015, 93.44 (ratio 10.28) on 05/26/2015, 255.45 (ratio of 24.05) on 07/14/2015, 373.89 (ratio 48.31) on 08/04/2015, 474.33 (ratio 70.58) on 08/25/2015, 450.76 (ratio 55.44) on 09/22/2015, 453.4 (ratio 59.89) on 10/20/2015, 58.26 (ratio of > 40.74) on 02/02/2016, 62.67 (ratio of 37.98) on 03/01/2016, 75.7 (ratio > 50.47) on 03/29/2016, 79.7 (ratio 53.13) on 04/26/2016, and 108.6 (ratio > 72.4) on 05/24/2016.  Bone survey on 12/08/2014 was stable.  Bone survey on 10/21/2015 revealed increase conspicuity of subcentimeter lytic lesions in the calvarium.  Bone marrow aspirate and biopsy on 11/04/2015 revealed an atypical monoclonal plasma cells estimated at 30-40% of marrow cells.   Marrow was variably cellular (approximately 45%) with  background trilineage hematopoiesis. There was no significant increase in marrow reticulin fibers. Storage iron was present.    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 He develoed herpes zoster in 04/2008. He developed a pulmonary embolism in 05/2013. He was initially on Xarelto, but is now on Eliquis. He had an episode of pneumonia around this time requiring a brief admission. He developed severe lower leg cramps on 08/07/2014 secondary to hypokalemia. Duplex was negative.   He was treated for C difficile colitis (Flagyl completed 07/30/2015).  He has a chronic indwelling pain pump.  He received 4 cycles of  Pomalyst and Decadron (08/27/2015 - 11/19/2015).  Restaging studies document progressive disease.  Kappa free light chains are increasing.  SPEP revealed 0.5 gm/dL monoclonal protein then 1.3 gm/dL.  Bone survey reveals increase conspicuity of subcentimeter lytic lesions in the calvarium.  Bone marrow reveals 30-40% plasma cells.   MUGA on 11/30/2015 revealed an ejection fraction of 46%.  He is felt not to be a good candidate for Kyprolis.  There were no focal wall motion abnormalities.  He had a stress echo less than 1 year ago.  He has a history of PVCs and atrial fibrillation.  He takes Cardizem prn.  He is s/p 17 weeks of daratumumab (Darzalex) (12/09/2015 - 05/25/2016).  He tolerated treatment well without side effect.  Bone marrow on 02/09/2016 revealed no diagnostic morphologic evidence of plasma cell myeloma.  Marrow was normocellular to hypocellular marrow for age (ranging from 10-40%) with maturing trilineage hematopoiesis and mild multilineage dyspoiesis.  There was patchy mild increase in reticulin.  Storage iron was present.  Flow cytometry revealed no definitive evidence of monoclonality.  There was a non-specific atypical myeloid and monocytic findings with no increase in blasts.  Cytogenetics were normal (46, XY).  He is currently day 68 s/p 2nd autologous stem cell transplant on 06/16/2016.  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  He also experienced atrial fibrillation with intermittent episodes of RVR requiring IV beta blockers.  He is on prophylactic valacyclovir for 1 year post transplant.  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  Work-up on 08/23/2016 revealed the following normal studies:  B12 and folate.  Ferritin was 1379 (high).  Iron studies included a saturation of 56% and TIBC 141 (low).  ESR was 64.  Thrombocytopenia is improving slowly post transplant.  Platelet count was 129,000  yesterday.  Symptomatically, he feels pretty good.  He has xerostomia.  He is eating better.  He has gained 3 pound in the past 3 weeks.  He has occasional nausea.  Counts are slowly improving.  Creatinine has increased (1.08 to 1.47).  Magnesium is 1.2.  Plan:  1.  Review labs from yesterday.  Discuss improving counts.  Discuss increased creatinine.  Discuss low magnesium. 2.  Magnesium 3 gm IV today. 3.  Discuss xerostomia and SalivaMax. 4.  RTC on 09/19/2016 for labs (CBC with diff, CMP, Mg) 5.  RTC on 09/20/2016 for IV magnesium +/- IVF 6.  RTC on 09/26/2016 in [place] for MD assessment, labs (CBC with diff, BMP, Mg) +/- IVF and magnesium   Lequita Asal, MD  09/13/2016, 10:51 AM

## 2016-09-17 ENCOUNTER — Other Ambulatory Visit: Payer: Self-pay | Admitting: Hematology and Oncology

## 2016-09-19 ENCOUNTER — Telehealth: Payer: Self-pay | Admitting: *Deleted

## 2016-09-19 ENCOUNTER — Other Ambulatory Visit: Payer: Self-pay | Admitting: Hematology and Oncology

## 2016-09-19 ENCOUNTER — Ambulatory Visit: Payer: Medicare Other | Attending: Pain Medicine | Admitting: Pain Medicine

## 2016-09-19 ENCOUNTER — Inpatient Hospital Stay: Payer: Medicare Other

## 2016-09-19 ENCOUNTER — Encounter: Payer: Self-pay | Admitting: Pain Medicine

## 2016-09-19 VITALS — BP 115/68 | HR 106 | Temp 97.6°F | Resp 16 | Ht 67.0 in | Wt 150.0 lb

## 2016-09-19 DIAGNOSIS — C9000 Multiple myeloma not having achieved remission: Secondary | ICD-10-CM

## 2016-09-19 DIAGNOSIS — G894 Chronic pain syndrome: Secondary | ICD-10-CM | POA: Insufficient documentation

## 2016-09-19 DIAGNOSIS — E440 Moderate protein-calorie malnutrition: Secondary | ICD-10-CM

## 2016-09-19 DIAGNOSIS — M545 Low back pain: Secondary | ICD-10-CM | POA: Diagnosis present

## 2016-09-19 DIAGNOSIS — G893 Neoplasm related pain (acute) (chronic): Secondary | ICD-10-CM | POA: Diagnosis not present

## 2016-09-19 DIAGNOSIS — M4854XA Collapsed vertebra, not elsewhere classified, thoracic region, initial encounter for fracture: Secondary | ICD-10-CM | POA: Insufficient documentation

## 2016-09-19 DIAGNOSIS — Z79891 Long term (current) use of opiate analgesic: Secondary | ICD-10-CM | POA: Insufficient documentation

## 2016-09-19 DIAGNOSIS — Z451 Encounter for adjustment and management of infusion pump: Secondary | ICD-10-CM

## 2016-09-19 DIAGNOSIS — C9002 Multiple myeloma in relapse: Secondary | ICD-10-CM | POA: Diagnosis not present

## 2016-09-19 DIAGNOSIS — C9001 Multiple myeloma in remission: Secondary | ICD-10-CM | POA: Diagnosis not present

## 2016-09-19 DIAGNOSIS — Z9689 Presence of other specified functional implants: Secondary | ICD-10-CM | POA: Insufficient documentation

## 2016-09-19 DIAGNOSIS — S22080A Wedge compression fracture of T11-T12 vertebra, initial encounter for closed fracture: Secondary | ICD-10-CM | POA: Diagnosis not present

## 2016-09-19 DIAGNOSIS — F1190 Opioid use, unspecified, uncomplicated: Secondary | ICD-10-CM

## 2016-09-19 DIAGNOSIS — Z95828 Presence of other vascular implants and grafts: Secondary | ICD-10-CM

## 2016-09-19 LAB — COMPREHENSIVE METABOLIC PANEL
ALT: 11 U/L — ABNORMAL LOW (ref 17–63)
AST: 15 U/L (ref 15–41)
Albumin: 3.2 g/dL — ABNORMAL LOW (ref 3.5–5.0)
Alkaline Phosphatase: 56 U/L (ref 38–126)
Anion gap: 8 (ref 5–15)
BUN: 12 mg/dL (ref 6–20)
CO2: 21 mmol/L — ABNORMAL LOW (ref 22–32)
Calcium: 8.5 mg/dL — ABNORMAL LOW (ref 8.9–10.3)
Chloride: 107 mmol/L (ref 101–111)
Creatinine, Ser: 1.14 mg/dL (ref 0.61–1.24)
GFR calc Af Amer: >60 mL/min (ref >60)
GFR calc non Af Amer: >60 mL/min (ref >60)
Glucose, Bld: 103 mg/dL — ABNORMAL HIGH (ref 65–99)
Potassium: 4.3 mmol/L (ref 3.5–5.1)
Sodium: 136 mmol/L (ref 135–145)
Total Bilirubin: 0.6 mg/dL (ref 0.3–1.2)
Total Protein: 5.4 g/dL — ABNORMAL LOW (ref 6.5–8.1)

## 2016-09-19 LAB — CBC WITH DIFFERENTIAL/PLATELET
Basophils Absolute: 0 10*3/uL (ref 0–0.1)
Basophils Relative: 1 %
Eosinophils Absolute: 0.2 10*3/uL (ref 0–0.7)
Eosinophils Relative: 2 %
HCT: 28.3 % — ABNORMAL LOW (ref 40.0–52.0)
Hemoglobin: 9.5 g/dL — ABNORMAL LOW (ref 13.0–18.0)
Lymphocytes Relative: 14 %
Lymphs Abs: 1.1 10*3/uL (ref 1.0–3.6)
MCH: 37 pg — ABNORMAL HIGH (ref 26.0–34.0)
MCHC: 33.4 g/dL (ref 32.0–36.0)
MCV: 110.8 fL — ABNORMAL HIGH (ref 80.0–100.0)
Monocytes Absolute: 1.1 10*3/uL — ABNORMAL HIGH (ref 0.2–1.0)
Monocytes Relative: 14 %
Neutro Abs: 5.5 10*3/uL (ref 1.4–6.5)
Neutrophils Relative %: 69 %
Platelets: 138 10*3/uL — ABNORMAL LOW (ref 150–440)
RBC: 2.56 MIL/uL — ABNORMAL LOW (ref 4.40–5.90)
RDW: 21.6 % — ABNORMAL HIGH (ref 11.5–14.5)
WBC: 7.9 10*3/uL (ref 3.8–10.6)

## 2016-09-19 LAB — MAGNESIUM: Magnesium: 1.3 mg/dL — ABNORMAL LOW (ref 1.7–2.4)

## 2016-09-19 NOTE — Telephone Encounter (Signed)
-----   Message from Lequita Asal, MD sent at 09/19/2016  2:27 PM EST ----- Regarding: Labs back  Renal function better.  Needs 4 gms of magnesium.  M  ----- Message ----- From: Interface, Lab In [place] Sent: 09/19/2016   1:59 PM To: Lequita Asal, MD

## 2016-09-19 NOTE — Telephone Encounter (Signed)
Called pt to let him know that he will need magnesium tom. And his appt is at 10:30 in [place]. He is agreeable to the plan

## 2016-09-19 NOTE — Patient Instructions (Signed)
Opioid Overdose Introduction Opioids are substances that relieve pain by binding to pain receptors in your brain and spinal cord. Opioids include illegal drugs, such as heroin, as well as prescription pain medicines.An opioid overdose happens when you take too much of an opioid substance. This can happen with any type of opioid, including:  Heroin.  Morphine.  Codeine.  Methadone.  Oxycodone.  Hydrocodone.  Fentanyl.  Hydromorphone.  Buprenorphine. The effects of an overdose can be mild, dangerous, or even deadly. Opioid overdose is a medical emergency. What are the causes? This condition may be caused by:  Taking too much of an opioid by accident.  Taking too much of an opioid on purpose.  An error made by a health care provider who prescribes a medicine.  An error made by the pharmacist who fills the prescription order.  Using more than one substance that contains opioids at the same time.  Mixing an opioid with a substance that affects your heart, breathing, or blood pressure. These include alcohol, tranquilizers, sleeping pills, illegal drugs, and some over-the-counter medicines. What increases the risk? This condition is more likely in:  Children. They may be attracted to colorful pills. Because of a child's small size, even a small amount of a drug can be dangerous.  Elderly people. They may be taking many different drugs. Elderly people may have difficulty reading labels or remembering when they last took their medicine.  People who take an opioid on a long-term basis.  People who use:  Illegal drugs.  Other substances, including alcohol, while using an opioid.  People who have:  A history of drug or alcohol abuse.  Certain mental health conditions.  People who take opioids that are not prescribed for them. What are the signs or symptoms? Symptoms of this condition depend on the type of opioid and the amount that was taken. Common symptoms  include:  Sleepiness or difficulty waking from sleep.  Confusion.  Slurred speech.  Slowed breathing and a slow pulse.  Nausea and vomiting.  Abnormally small pupils. Signs and symptoms that require emergency treatment include:  Cold, clammy, and pale skin.  Blue lips and fingernails.  Vomiting.  Gurgling sounds in the throat.  A pulse that is very slow or difficult to detect.  Breathing that is very slow, noisy, or difficult to detect.  Limp body.  Inability to respond to speech or be awakened from sleep (stupor). How is this diagnosed? This condition is diagnosed based on your symptoms. It is important to tell your health care provider:  All of the opioidsthat you took.  When you took the opioids.  Whether you were drinking alcohol or using other substances. Your health care provider will do a physical exam. This exam may include:  Checking and monitoring your heart rate and rhythm, your breathing rate and depth, your temperature, and your blood pressure (vital signs).  Checking for abnormally small pupils.  Measuring oxygen levels in your blood. You may also have blood tests or urine tests. How is this treated? Supporting your vital signs and your breathing is the first step in treating an opioid overdose. Treatment may also include:  Giving fluids and minerals (electrolytes) through an IV tube.  Inserting a breathing tube (endotracheal tube) in your airway to help you breathe.  Giving oxygen.  Passing a tube through your nose and into your stomach (NG tube, or nasogastric tube) to wash out your stomach.  Giving medicines that:  Increase your blood pressure.  Absorb any opioid  that is in your digestive system.  Reverse the effects of the opioid (naloxone).  Ongoing counseling and mental health support if you intentionally overdosed or used an illegal drug. Follow these instructions at home:  Take over-the-counter and prescription medicines only  as told by your health care provider. Always ask your health care provider about possible side effects and interactions of any new medicine that you start taking.  Keep a list of all of the medicines that you take, including over-the-counter medicines. Bring this list with you to all of your medical visits.  Drink enough fluid to keep your urine clear or pale yellow.  Keep all follow-up visits as told by your health care provider. This is important. How is this prevented?  Get help if you are struggling with:  Alcohol or drug use.  Depression or another mental health problem.  Keep the phone number of your local poison control center near your phone or on your cell phone.  Store all medicines in safety containers that are out of the reach of children.  Read the drug inserts that come with your medicines.  Do not drink alcohol when taking opioids.  Do not use illegal drugs.  Do not take opioid medicines that are not prescribed for you. Contact a health care provider if:  Your symptoms return.  You develop new symptoms or side effects when you are taking medicines. Get help right away if:  You think that you or someone else may have taken too much of an opioid. The hotline of the [facility] is 240-603-5039.  You or someone else is having symptoms of an opioid overdose.  You have serious thoughts about hurting yourself or others.  You have:  Chest pain.  Difficulty breathing.  A loss of consciousness. Opioid overdose is an emergency. Do not wait to see if the symptoms will go away. Get medical help right away. Call your local emergency services (911 in the [place] Do not drive yourself to the hospital.  This information is not intended to replace advice given to you by your health care provider. Make sure you discuss any questions you have with your health care provider. Document Released: 11/02/2004 Document Revised: 03/02/2016 Document Reviewed:  10/08/2013  2017 [employer]

## 2016-09-19 NOTE — Progress Notes (Signed)
Safety precautions to be maintained throughout the outpatient stay will include: orient to surroundings, keep bed in low position, maintain call bell within reach at all times, provide assistance with transfer out of bed and ambulation.  

## 2016-09-19 NOTE — Progress Notes (Signed)
Patient's Name: Logan Perez  MRN: [id number]  Referring Provider: Sherrin Daisy, MD  DOB: 1946-09-19  PCP: Sherrin Daisy, MD  DOS: 09/19/2016  Note by: Kathlen Brunswick. Dossie Arbour, MD  Service setting: Ambulatory outpatient  Location: [facility] (AMB) Pain Management Facility  Visit type: Procedure  Specialty: Interventional Pain Management  Patient type: Established   Primary Reason for Visit: Interventional Pain Management Treatment. CC: Back Pain (lower)  Procedure:  Intrathecal Drug Delivery System (IDDS):  Type: Reservoir Refill [phone])       Region: Abdominal Laterality: Right  Type of Pump: Medtronic Synchromed II (MRI-compatible) Delivery Route: Intrathecal Type of Pain Treated: Neuropathic/Nociceptive Primary Medication Class: Opioid/opiate  Medication, Concentration, Infusion Program, & Delivery Rate: Please see scanned programming printout.  Indications: 1. Cancer associated pain   2. Chronic pain syndrome   3. Compression fracture of T12 vertebra (HCC) (70-75% magnitude) (with mild retropulsion)   4. Multiple myeloma in remission ([place])   5. Long term current use of opiate analgesic   6. Opiate use   7. Presence of implanted infusion pump (Medtronic, programmable, intrathecal pump)   8. Encounter for adjustment or management of infusion pump    Pain Assessment: Self-Reported Pain Score: 3 /10             Reported level is compatible with observation.        Intrathecal Pump Therapy Assessment  Manufacturer: Medtronic Synchromed II Type: Programmable Volume: 40 mL reservoir MRI compatibility: Yes   Drug content:  Primary Medication Class: Opioid Primary Medication: PF-Fentanyl Secondary Medication: see pump readout Other Medication: see pump readout   Programming:  Type: Simple continuous. See pump readout for details.   Changes:  Medication Change: None at this point Rate Change: No change in rate  Reported side-effects or adverse reactions: None  reported  Effectiveness: Described as relatively effective, allowing for increase in activities of daily living (ADL) Clinically meaningful improvement in function (CMIF): Sustained CMIF goals met  Plan: Pump refill today  Pre-Procedure Assessment:  Logan Perez is a 70 y.o. (year old), male patient, seen today for interventional treatment. He  has a past surgical history that includes Back surgery (1994); Knee arthroscopy (Left, 1992); Pain pump implantation (2012); stem cell implant (2008); Cataract extraction w/PHACO (Right, 05/02/2016); Cataract extraction w/PHACO (Left, 05/16/2016); and Limbal stem cell transplant.. His primarily concern today is the Back Pain (lower)  Pain Type: Chronic pain Pain Location: Back Pain Orientation: Lower Pain Descriptors / Indicators: Dull Pain Frequency: Intermittent  Verification of the correct person, correct site (including marking of site), and correct procedure were performed and confirmed by the patient.  Consent: Before the procedure and under the influence of no sedative(s), amnesic(s), or anxiolytics, the patient was informed of the treatment options, risks and possible complications. To fulfill our ethical and legal obligations, as recommended by the American Medical Association's Code of Ethics, I have informed the patient of my clinical impression; the nature and purpose of the treatment or procedure; the risks, benefits, and possible complications of the intervention; the alternatives, including doing nothing; the risk(s) and benefit(s) of the alternative treatment(s) or procedure(s); and the risk(s) and benefit(s) of doing nothing.  Logan Perez was provided with information about the general risks and possible complications associated with most interventional procedures. These include, but are not limited to: failure to achieve desired goals, infection, bleeding, organ or nerve damage, allergic reactions, paralysis, and/or death.  In addition, he  was informed of those risks and possible complications associated to  this particular procedure, which include, but are not limited to: damage to the implant; failure to decrease pain; local, systemic, or serious CNS infections, intraspinal abscess with possible cord compression and paralysis, or life-threatening such as meningitis; bleeding; organ damage; nerve injury or damage with subsequent sensory, motor, and/or autonomic system dysfunction, resulting in transient or permanent pain, numbness, and/or weakness of one or several areas of the body; allergic reactions, either minor or major life-threatening, such as anaphylactic or anaphylactoid reactions.  Furthermore, Logan Perez was informed of those risks and complications associated with the medications. These include, but are not limited to: allergic reactions (i.e.: anaphylactic or anaphylactoid reactions); endorphine suppression; bradycardia and/or hypotension; water retention and/or peripheral vascular relaxation leading to lower extremity edema and possible stasis ulcers; respiratory depression and/or shortness of breath; decreased metabolic rate leading to weight gain; swelling or edema; medication-induced neural toxicity; particulate matter embolism and blood vessel occlusion with resultant organ, and/or nervous system infarction; and/or intrathecal granuloma formation with possible spinal cord compression and permanent paralysis.  Before refilling the pump Logan Perez was informed that some of the medications used in the devise may not be FDA approved for such use and therefore it constitutes an off-label use of the medications.  Finally, he was informed that Medicine is not an exact science; therefore, there is also the possibility of unforeseen or unpredictable risks and/or possible complications that may result in a catastrophic outcome. The patient indicated having understood very clearly. We have given the patient no guarantees and we have  made no promises. Enough time was given to the patient to ask questions, all of which were answered to the patient's satisfaction. Logan Perez has indicated that he wanted to continue with the procedure.  Consent Attestation: I, the ordering provider, attest that I have discussed with the patient the benefits, risks, side-effects, alternatives, likelihood of achieving goals, and potential problems during recovery for the procedure that I have provided informed consent.  Pre-Procedure Preparation:  Safety Precautions: Allergies reviewed. The patient was asked about blood thinners, or active infections, both of which were denied. The patient was asked to confirm the procedure and laterality, before marking the site, and again before commencing the procedure. Appropriate site, procedure, and patient were confirmed by following the Joint Commission's Universal Protocol (UP.01.01.01), in the form of a "Time Out". The patient was asked to participate by confirming the accuracy of the "Time Out" information. Patient was assessed for positional comfort and pressure points before starting the procedure. Allergies: He is allergic to azithromycin; zometa [zoledronic acid]; and rivaroxaban. Allergy Precautions: None required Infection Control Precautions: Sterile technique used. Standard Universal Precautions were taken as recommended by the Department of [facility] for Disease Control and Prevention (CDC). Standard pre-surgical skin prep was conducted. Respiratory hygiene and cough etiquette was practiced. Hand hygiene observed. Safe injection practices and needle disposal techniques followed. SDV (single dose vial) medications used. Medications properly checked for expiration dates and contaminants. Personal protective equipment (PPE) used as per protocol. Monitoring:  As per clinic protocol. Vitals:   09/19/16 1100  BP: 115/68  Pulse: (!) 106  Resp: 16  Temp: 97.6 F (36.4 C)  SpO2: 100%   Weight: 150 lb (68 kg)  Height: '5\' 7"'  (1.702 m)  Calculated BMI: Body mass index is 23.49 kg/m. Time-out: "Time-out" completed before starting procedure, as per protocol.  Description of Procedure Process:   Time-out: "Time-out" completed before starting procedure, as per protocol. Position: Supine Target Area: Central-port of intrathecal pump.  Approach: Anterior, 90 degree angle approach. Area Prepped: Entire Area around the pump implant. Prepping solution: ChloraPrep (2% chlorhexidine gluconate and 70% isopropyl alcohol) Safety Precautions: Aspiration looking for blood return was conducted prior to all injections. At no point did we inject any substances, as a needle was being advanced. No attempts were made at seeking any paresthesias. Safe injection practices and needle disposal techniques used. Medications properly checked for expiration dates. SDV (single dose vial) medications used. Description of the Procedure: Protocol guidelines were followed. Two nurses trained to do implant refills were present during the entire procedure. The refill medication was checked by both healthcare providers as well as the patient. The patient was included in the "Time-out" to verify the medication. The patient was placed in position. The pump was identified. The area was prepped in the usual manner. The sterile template was positioned over the pump, making sure the side-port location matched that of the pump. Both, the pump and the template were held for stability. The needle provided in the Medtronic Kit was then introduced thru the center of the template and into the central port. The pump content was aspirated and discarded volume documented. The new medication was slowly infused into the pump, thru the filter, making sure to avoid overpressure of the device. The needle was then removed and the area cleansed, making sure to leave some of the prepping solution back to take advantage of its long term  bactericidal properties. The pump was interrogated and programmed to reflect the correct medication, volume, and dosage. The program was printed and taken to the physician for approval. Once checked and signed by the physician, a copy was provided to the patient and another scanned into the EMR. EBL: None Materials & Medications: Medtronic Refill Kit Medication(s): Please see chart orders for details.  Imaging Guidance:  Type of Imaging Technique: None used Indication(s): N/A Exposure Time: No patient exposure Contrast: None used. Fluoroscopic Guidance: N/A Ultrasound Guidance: N/A Interpretation: N/A  Antibiotic Prophylaxis:  Indication(s): No indications identified. Type:  Antibiotics Given (last 72 hours)    None      Post-procedure Assessment:  Complications: None. Procedure was completely uneventful. Disposition: The patient tolerated the entire procedure well. A repeat set of vitals were taken after the procedure. No post-procedural neurological deterioration observed. The patient was provided with discharge instructions, including how to identify potential problems. Should any problems arise concerning this procedure, the patient was given instructions to immediately contact us, at any time, without hesitation. Comments:  No additional relevant information.  Assessment  Primary Diagnosis & Pertinent Problem List: The primary encounter diagnosis was Cancer associated pain. Diagnoses of Chronic pain syndrome, Compression fracture of T12 vertebra (HCC) (70-75% magnitude) (with mild retropulsion), Multiple myeloma in remission ([place]), Long term current use of opiate analgesic, Opiate use, Presence of implanted infusion pump (Medtronic, programmable, intrathecal pump), and Encounter for adjustment or management of infusion pump were also pertinent to this visit.  Status Diagnosis   Stable  Stable  Stable 1. Cancer associated pain   2. Chronic pain syndrome   3. Compression  fracture of T12 vertebra (HCC) (70-75% magnitude) (with mild retropulsion)   4. Multiple myeloma in remission ([place])   5. Long term current use of opiate analgesic   6. Opiate use   7. Presence of implanted infusion pump (Medtronic, programmable, intrathecal pump)   8. Encounter for adjustment or management of infusion pump      Plan of Care  Discharge to: Discharge home  Medications ordered for procedure: No orders of the defined types were placed in this encounter.  Medications administered: (For more details, see medical record) Logan Perez had no medications administered during this visit. Lab-work, Procedure(s), & Referral(s) Ordered: Orders Placed This Encounter  Procedures  . PUMP REFILL  . PUMP REFILL   Imaging Ordered: New Prescriptions   No medications on file   Physician-requested Follow-up:  Return for Pump Refill (as per pump program).  Future Appointments Date Time Provider [place]  09/19/2016 1:45 PM [facility] LAB [facility] None  09/20/2016 10:30 AM [facility] INFUSION CHAIR 7 [facility] None  09/25/2016 9:30 AM [facility] None  09/25/2016 9:45 AM Lequita Asal, MD [facility] None  09/25/2016 10:00 AM [facility] INFUSION CHAIR 11 [facility] None  01/02/2017 10:45 AM Milinda Pointer, MD [facility] None  05/01/2017 8:30 AM Nori Riis, PA-C [facility] None   Primary Care Physician: Sherrin Daisy, MD Location: [facility] Note by: Kathlen Brunswick. Dossie Arbour, M.D, DABA, DABAPM, DABPM, DABIPP, FIPP Date: 09/19/16; Time: 1:08 PM  Disclaimer:  Medicine is not an Chief Strategy Officer. The only guarantee in medicine is that nothing is guaranteed. It is important to note that the decision to proceed with this intervention was based on the information collected from the patient. The Data and conclusions were drawn from the patient's questionnaire, the interview, and the physical examination. Because the information was provided  in large part by the patient, it cannot be guaranteed that it has not been purposely or unconsciously manipulated. Every effort has been made to obtain as much relevant data as possible for this evaluation. It is important to note that the conclusions that lead to this procedure are derived in large part from the available data. Always take into account that the treatment will also be dependent on availability of resources and existing treatment guidelines, considered by other Pain Management Practitioners as being common knowledge and practice, at the time of the intervention. For Medico-Legal purposes, it is also important to point out that variation in procedural techniques and pharmacological choices are the acceptable norm. The indications, contraindications, technique, and results of the above procedure should only be interpreted and judged by a Board-Certified Interventional Pain Specialist with extensive familiarity and expertise in the same exact procedure and technique. Attempts at providing opinions without similar or greater experience and expertise than that of the treating physician will be considered as inappropriate and unethical, and shall result in a formal complaint to the state medical board and applicable specialty societies.  Instructions provided at this appointment: Patient Instructions  Opioid Overdose Introduction Opioids are substances that relieve pain by binding to pain receptors in your brain and spinal cord. Opioids include illegal drugs, such as heroin, as well as prescription pain medicines.An opioid overdose happens when you take too much of an opioid substance. This can happen with any type of opioid, including:  Heroin.  Morphine.  Codeine.  Methadone.  Oxycodone.  Hydrocodone.  Fentanyl.  Hydromorphone.  Buprenorphine. The effects of an overdose can be mild, dangerous, or even deadly. Opioid overdose is a medical emergency. What are the causes? This  condition may be caused by:  Taking too much of an opioid by accident.  Taking too much of an opioid on purpose.  An error made by a health care provider who prescribes a medicine.  An error made by the pharmacist who fills the prescription order.  Using more than one substance that contains opioids at the same time.  Mixing an opioid with a substance that affects your heart, breathing, or blood pressure. These include alcohol, tranquilizers, sleeping pills, illegal drugs, and some over-the-counter medicines. What increases the risk? This condition is more likely in:  Children. They may be attracted to colorful pills. Because of a child's small size, even a small amount of a drug can be dangerous.  Elderly people. They may be taking many different drugs. Elderly people may have difficulty reading labels or remembering when they last took their medicine.  People who take an opioid on a long-term basis.  People who use:  Illegal drugs.  Other substances, including alcohol, while using an opioid.  People who have:  A history of drug or alcohol abuse.  Certain mental health conditions.  People who take opioids that are not prescribed for them. What are the signs or symptoms? Symptoms of this condition depend on the type of opioid and the amount that was taken. Common symptoms include:  Sleepiness or difficulty waking from sleep.  Confusion.  Slurred speech.  Slowed breathing and a slow pulse.  Nausea and vomiting.  Abnormally small pupils. Signs and symptoms that require emergency treatment include:  Cold, clammy, and pale skin.  Blue lips and fingernails.  Vomiting.  Gurgling sounds in the throat.  A pulse that is very slow or difficult to detect.  Breathing that is very slow, noisy, or difficult to detect.  Limp body.  Inability to respond to speech or be awakened from sleep (stupor). How is this diagnosed? This condition is diagnosed based on your  symptoms. It is important to tell your health care provider:  All of the opioidsthat you took.  When you took the opioids.  Whether you were drinking alcohol or using other substances. Your health care provider will do a physical exam. This exam may include:  Checking and monitoring your heart rate and rhythm, your breathing rate and depth, your temperature, and your blood pressure (vital signs).  Checking for abnormally small pupils.  Measuring oxygen levels in your blood. You may also have blood tests or urine tests. How is this treated? Supporting your vital signs and your breathing is the first step in treating an opioid overdose. Treatment may also include:  Giving fluids and minerals (electrolytes) through an IV tube.  Inserting a breathing tube (endotracheal tube) in your airway to help you breathe.  Giving oxygen.  Passing a tube through your nose and into your stomach (NG tube, or nasogastric tube) to wash out your stomach.  Giving medicines that:  Increase your blood pressure.  Absorb any opioid that is in your digestive system.  Reverse the effects of the opioid (naloxone).  Ongoing counseling and mental health support if you intentionally overdosed or used an illegal drug. Follow these instructions at home:  Take over-the-counter and prescription medicines only as told by your health care provider. Always ask your health care provider about possible side effects and interactions of any new medicine that you start taking.  Keep a list of all of the medicines that you take, including over-the-counter medicines. Bring this list with you to all of your medical visits.  Drink enough fluid to keep your urine clear or pale yellow.  Keep all follow-up visits as told by your health care provider. This is important. How is this prevented?  Get help if you are struggling with:  Alcohol or drug use.  Depression or another mental health problem.  Keep the phone  number of your local poison control  center near your phone or on your cell phone.  Store all medicines in safety containers that are out of the reach of children.  Read the drug inserts that come with your medicines.  Do not drink alcohol when taking opioids.  Do not use illegal drugs.  Do not take opioid medicines that are not prescribed for you. Contact a health care provider if:  Your symptoms return.  You develop new symptoms or side effects when you are taking medicines. Get help right away if:  You think that you or someone else may have taken too much of an opioid. The hotline of the [facility] is 3807111437.  You or someone else is having symptoms of an opioid overdose.  You have serious thoughts about hurting yourself or others.  You have:  Chest pain.  Difficulty breathing.  A loss of consciousness. Opioid overdose is an emergency. Do not wait to see if the symptoms will go away. Get medical help right away. Call your local emergency services (911 in the [place] Do not drive yourself to the hospital.  This information is not intended to replace advice given to you by your health care provider. Make sure you discuss any questions you have with your health care provider. Document Released: 11/02/2004 Document Revised: 03/02/2016 Document Reviewed: 10/08/2013  2017 [employer]

## 2016-09-20 ENCOUNTER — Inpatient Hospital Stay: Payer: Medicare Other

## 2016-09-20 DIAGNOSIS — C9002 Multiple myeloma in relapse: Secondary | ICD-10-CM | POA: Diagnosis not present

## 2016-09-20 MED ORDER — MAGNESIUM SULFATE 4 GM/100ML IV SOLN
4.0000 g | Freq: Once | INTRAVENOUS | Status: AC
  Administered 2016-09-20: 4 g via INTRAVENOUS

## 2016-09-20 MED ORDER — SODIUM CHLORIDE 0.9 % IV SOLN
Freq: Once | INTRAVENOUS | Status: AC
  Administered 2016-09-20: 11:00:00 via INTRAVENOUS
  Filled 2016-09-20: qty 1000

## 2016-09-24 ENCOUNTER — Encounter: Payer: Self-pay | Admitting: Hematology and Oncology

## 2016-09-24 NOTE — Progress Notes (Signed)
[facility] day:  09/25/16  Chief Complaint: Logan Perez is a 70 y.o. 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  HPI: The patient was last seen in the medical oncology clinic on 09/13/2016.  At that time, he felt pretty good.  He had chronic loose stools managed periodically with Imodium. He was able to taste spicy foods.  He had gained 1 pound in a week.   Creatinine had increased.  We discussed good hydration.  Magnesium was low (1.2).  He received 3 gm of IV magnesium.  Labs on 09/19/2016 revealed a hematocrit of 28.3, hemoglobin 9.5, MCV 110.8, platelets 138,000, WBC 7900 with an Crawford of 5500.  Creatinine was 1.14 (back to baseline).  Albumen was 3.2.  Magnesium was 1.3.  He received 4 gm of magnesium sulfate.  During the interim, he has felt "same old, same old".  He denies any fevers or infections.  He denies any bruising or bleeding.   Past Medical History:  Diagnosis Date  . Anxiety   . Atrial fibrillation ([place])   . BPH (benign prostatic hyperplasia)   . Complication of anesthesia    BAD HEADACHE NIGHT OF FIRST CATARACT  . Difficulty voiding   . Dysrhythmia    A FIB  . Elevated PSA   . GERD (gastroesophageal reflux disease)   . HLD (hyperlipidemia)   . HOH (hard of hearing)   . Hypertension   . Multiple myeloma ([place])   . Neuropathy ([place])   . Pain    BACK  . Palpitations   . Pneumonia   . Pulmonary embolism ([place])   . Stroke [facility])    TIA    Past Surgical History:  Procedure Laterality Date  . BACK SURGERY  1994  . CATARACT EXTRACTION W/PHACO Right 05/02/2016   Procedure: CATARACT EXTRACTION PHACO AND INTRAOCULAR LENS PLACEMENT (IOC);  Surgeon: Birder Robson, MD;  Location: [facility] ORS;  Service: Ophthalmology;  Laterality: Right;  [place] 1.06 AP% 20.6 CDE 13.70 FLUID PACK LOT # [id number]  . CATARACT  EXTRACTION W/PHACO Left 05/16/2016   Procedure: CATARACT EXTRACTION PHACO AND INTRAOCULAR LENS PLACEMENT (IOC);  Surgeon: Birder Robson, MD;  Location: [facility] ORS;  Service: Ophthalmology;  Laterality: Left;  [place] 01:43 AP% 19.8 CDE 20.45 FLUID PACK LOT #[id number] H  . KNEE ARTHROSCOPY Left 1992  . LIMBAL STEM CELL TRANSPLANT    . PAIN PUMP IMPLANTATION  2012  . stem cell implant  2008   [facility]    Family History  Problem Relation Age of Onset  . Cancer Father     throat  . Kidney disease Sister   . Stroke    . Bladder Cancer Neg Hx   . Prostate cancer Neg Hx     Social History:  reports that he has quit smoking. His smoking use included Cigarettes. He has a 30.00 pack-year smoking history. He has quit using smokeless tobacco. He reports that he does not drink alcohol. His drug history is not on file.  His wife's name is Logan Perez.  The patient is accompanied by his wife today.  Allergies:  Allergies  Allergen Reactions  . Azithromycin Diarrhea    Possible cause of C. Diff Possible cause of C. Diff Possible cause of C. Diff Possible cause of C. Diff Possible cause of C. Diff  . 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 Rivaroxaban Rash    Current Medications: Current Outpatient Prescriptions  Medication Sig Dispense Refill  . acetaminophen (TYLENOL) 500 MG tablet Take 1,000 mg by mouth.    . ALPRAZolam (XANAX) 0.25 MG tablet TAKE 1 TABLET BY MOUTH EVERY NIGHT AT BEDTIME AS NEEDED FOR ANXIETY. 30 tablet 3  . baclofen (LIORESAL) 10 MG tablet Take 1 tablet (10 mg total) by mouth at bedtime. 30 each 3  . calcium citrate-vitamin D (CITRACAL+D) 315-200 MG-UNIT per tablet Take 1 tablet by mouth daily.     . cetirizine (ZYRTEC) 10 MG tablet Take 10 mg by mouth daily.     . Cholecalciferol (VITAMIN D3) 2000 UNITS capsule  Take 2,000 Units by mouth daily.     . Cyanocobalamin (RA VITAMIN B-12 TR) 1000 MCG TBCR Take 1,000 mcg by mouth daily.     [name] diltiazem (CARDIZEM) 60 MG tablet Take 60 mg by mouth as needed.    . diltiazem (DILACOR XR) 120 MG 24 hr capsule Take 120 mg by mouth daily.    [name] ELIQUIS 5 MG TABS tablet TAKE 1 TABLET(5 MG) BY MOUTH TWICE DAILY 60 tablet 0  . fluticasone (FLONASE) 50 MCG/ACT nasal spray Place 1 spray into the nose daily as needed for allergies.     . Iron-Vitamins (GERITOL PO) Take by mouth.    . loperamide (IMODIUM) 2 MG capsule Take 2 mg by mouth as needed for diarrhea or loose stools. Reported on 04/26/2016    . lovastatin (MEVACOR) 20 MG tablet     . montelukast (SINGULAIR) 10 MG tablet TAKE 1 TABLET BY MOUTH ONCE A DAY AS DIRECTED. 30 tablet 0  . omeprazole (PRILOSEC) 20 MG capsule Take 20 mg by mouth daily.     . potassium chloride SA (K-DUR,KLOR-CON) 20 MEQ tablet TAKE 1 TABLET(20 MEQ) BY MOUTH TWICE DAILY 180 tablet 0  . prochlorperazine (COMPAZINE) 10 MG tablet Take 10 mg by mouth every 6 (six) hours as needed for nausea or vomiting.    . ranitidine (ZANTAC) 150 MG tablet Take 150 mg by mouth daily.     . tamsulosin (FLOMAX) 0.4 MG CAPS capsule Take 1 capsule (0.4 mg total) by mouth daily. 90 capsule 4  . valACYclovir (VALTREX) 500 MG tablet TAKE 1 TABLET BY MOUTH DAILY 30 tablet 0  . PAIN MANAGEMENT IT PUMP REFILL 1 each by Intrathecal route once. Medication: PF Fentanyl 1,500.0 mcg/ml PF Bupivicaine 30.0 mg/ml PF Clonidine 300.62mg/ml Total Volume: 40 ml Needed by 09-19-16 '@1000'  1 each 0   No current facility-administered medications for this visit.     Review of Systems:  GENERAL: Feels "the same".  General fatigue.  No fevers or sweats.  Weight down 1 pound. PERFORMANCE STATUS (ECOG): 1 HEENT:  Cataracts s/p surgery.  Dry mouth.  Taste buds "shot".  Can taste spicy foods.  No sore throat, mouth sores or tenderness. Lungs:  No shortness of breath or cough.  No  hemoptysis. Cardiac:  No chest pain, palpitations, orthopnea, or PND. GI:  Appetite good.  Nausea in afternoon managed with Compazine.  3 loose BM/day.  No vomiting, constipation, melena or hematochezia. GU:  No urgency, frequency, dysuria, or hematuria. Musculoskeletal:  Compression fracture of back on a pain pump. No muscle tenderness. Extremities:  No pain or swelling. Skin:  Fragile skin.  No unexplained bruises.  No rashes or  skin changes. Neuro:  Little tremor (chronic).  Neuropathy in feet.  No headache, weakness, balance or coordination issues. Endocrine:  No diabetes, thyroid issues, hot flashes or night sweats. Psych:  No mood changes, depression or anxiety. Pain:  Chronic back pain (pain well managed with pump). Review of systems:  All other systems reviewed and found to be negative.   Physical Exam: Blood pressure (!) 94/56, pulse (!) 105, temperature 97.1 F (36.2 C), temperature source Tympanic, resp. rate 18, weight 144 lb 2.9 oz (65.4 kg). GENERAL:  Thin elderly gentleman sitting comfortably in the exam room in no acute distress.  He has a rolling walker at his side. MENTAL STATUS:  Alert and oriented to person, place and time. HEAD:  Wearing a herringbone golf cap.  Graying goatee.  Temporal wasting.  Normocephalic, atraumatic, face symmetric, no Cushingoid features. EYES:  Glasses.  Blue eyes s/p cataract surgery.  Pupils equal round and reactive to light and accomodation.  No conjunctivitis or scleral icterus. ENT:  Hearing aide.  Oropharynx clear without lesion.  Hearing aide.  Tongue normal. Mucous membranes moist.  RESPIRATORY:  Clear to auscultation without rales, wheezes or rhonchi. CARDIOVASCULAR:  Regular rate and rhythm without murmur, rub or gallop. ABDOMEN:  RUQ pain pump.  Soft, non-tender, with active bowel sounds, and no hepatosplenomegaly.  No masses. SKIN:  No rashes, ulcers or lesions. EXTREMITIES: Ankle edema.  No skin discoloration or tenderness.  No  palpable cords. LYMPH NODES: No palpable cervical, supraclavicular, axillary or inguinal adenopathy  NEUROLOGICAL: Intention tremor. PSYCH:  Appropriate.    Appointment on 09/25/2016  Component Date Value Ref Range Status  . WBC 09/25/2016 8.5  3.8 - 10.6 K/uL Final  . RBC 09/25/2016 2.72* 4.40 - 5.90 MIL/uL Final  . Hemoglobin 09/25/2016 10.2* 13.0 - 18.0 g/dL Final  . HCT 09/25/2016 30.2* 40.0 - 52.0 % Final  . MCV 09/25/2016 110.8* 80.0 - 100.0 fL Final  . MCH 09/25/2016 37.4* 26.0 - 34.0 pg Final  . MCHC 09/25/2016 33.7  32.0 - 36.0 g/dL Final  . RDW 09/25/2016 20.4* 11.5 - 14.5 % Final  . Platelets 09/25/2016 159  150 - 440 K/uL Final  . Neutrophils Relative % 09/25/2016 72  % Final  . Neutro Abs 09/25/2016 6.1  1.4 - 6.5 K/uL Final  . Lymphocytes Relative 09/25/2016 14  % Final  . Lymphs Abs 09/25/2016 1.2  1.0 - 3.6 K/uL Final  . Monocytes Relative 09/25/2016 12  % Final  . Monocytes Absolute 09/25/2016 1.0  0.2 - 1.0 K/uL Final  . Eosinophils Relative 09/25/2016 2  % Final  . Eosinophils Absolute 09/25/2016 0.1  0 - 0.7 K/uL Final  . Basophils Relative 09/25/2016 0  % Final  . Basophils Absolute 09/25/2016 0.0  0 - 0.1 K/uL Final  . Sodium 09/25/2016 139  135 - 145 mmol/L Final  . Potassium 09/25/2016 3.9  3.5 - 5.1 mmol/L Final  . Chloride 09/25/2016 110  101 - 111 mmol/L Final  . CO2 09/25/2016 21* 22 - 32 mmol/L Final  . Glucose, Bld 09/25/2016 87  65 - 99 mg/dL Final  . BUN 09/25/2016 13  6 - 20 mg/dL Final  . Creatinine, Ser 09/25/2016 1.25* 0.61 - 1.24 mg/dL Final  . Calcium 09/25/2016 9.2  8.9 - 10.3 mg/dL Final  . GFR calc non Af Amer 09/25/2016 57* >60 mL/min Final  . GFR calc Af Amer 09/25/2016 >60  >60 mL/min Final   Comment: (NOTE) The eGFR has been calculated  using the CKD EPI equation. This calculation has not been validated in all clinical situations. eGFR's persistently <60 mL/min signify possible Chronic Kidney Disease.   . Anion gap 09/25/2016 8  5  - 15 Final  . Magnesium 09/25/2016 1.7  1.7 - 2.4 mg/dL Final    Assessment:  Logan Perez is a 70 y.o. male with stage III mutiple myeloma.  He initially presented with progressive back pain beginning in 12/2006.  MRI revealed "spots and compression fractures".  He began Velcade, thalidomide, and Decadron.  In 08/2007, he underwent high dose chemotherapy and autologous stem cell transplant.  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  He recurred with a rising M-spike (2.7) with repeat M spike (1.7 gm/dl) in 03/2010.  He was initially treated with Velcade (02/08/2010 - 05/10/2010).  He then began Revlimid (15 mg 3 weeks on/1 week off) and Decadron (40 mg on day 1, 8, 15, 22).  Because of significant side effect with Decadron his dose was decreased to 10 mg once a week in 07/2010.    He was on maintenance Revlimid. Revlimid was initially10 mg 3 weeks on/1 week off. This was changed to 10 mg 2 weeks on/2 weeks off secondary to right nipple tenderness. His dose was increased to 10 mg 3 weeks on/1 week off with Decadron 10 mg a week (on Sundays) and then Revlamid 15 mg 3 weeks on and 1 week off with Decadron on Sundays.  He began Pomalyst 4 mg 3 weeks on/1 week off with Decadron on 08/27/2015.  Over the past year his SPEP has revealed no monoclonal protein (04/21/2015) and 0.5 gm/dL on 09/22/2015 and 10/20/2015. M spike was 0.1 on 02/02/2016, 03/01/2016, 03/29/2016, 04/26/2016, and 05/24/2016.  Free light chains have been monitored. Kappa free light chains were 18.54 on 11/21/2013, 18.37 on 02/20/2014, 18.93 on 05/22/2014, 32.58 (high; normal ratio 1.27) on 08/21/2014, 51.53 (high; elevated ratio of 2.12) on 11/13/2014, 28.08 (ratio 1.73) on 12/09/2014, 23.71 (ratio 2.17) on 01/18/2015, 92.93 (ratio 9.49) on 04/21/2015, 93.44 (ratio 10.28) on 05/26/2015, 255.45 (ratio of 24.05) on 07/14/2015, 373.89 (ratio 48.31) on 08/04/2015, 474.33 (ratio 70.58) on 08/25/2015, 450.76  (ratio 55.44) on 09/22/2015, 453.4 (ratio 59.89) on 10/20/2015, 58.26 (ratio of > 40.74) on 02/02/2016, 62.67 (ratio of 37.98) on 03/01/2016, 75.7 (ratio > 50.47) on 03/29/2016, 79.7 (ratio 53.13) on 04/26/2016, and 108.6 (ratio > 72.4) on 05/24/2016.  Bone survey on 12/08/2014 was stable.  Bone survey on 10/21/2015 revealed increase conspicuity of subcentimeter lytic lesions in the calvarium.  Bone marrow aspirate and biopsy on 11/04/2015 revealed an atypical monoclonal plasma cells estimated at 30-40% of marrow cells.   Marrow was variably cellular (approximately 45%) with background trilineage hematopoiesis. There was no significant increase in marrow reticulin fibers. Storage iron was present.    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 He develoed herpes zoster in 04/2008. He developed a pulmonary embolism in 05/2013. He was initially on Xarelto, but is now on Eliquis. He had an episode of pneumonia around this time requiring a brief admission. He developed severe lower leg cramps on 08/07/2014 secondary to hypokalemia. Duplex was negative.   He was treated for C difficile colitis (Flagyl completed 07/30/2015).  He has a chronic indwelling pain pump.  He received 4 cycles of Pomalyst and Decadron (08/27/2015 - 11/19/2015).  Restaging studies document progressive disease.  Kappa free light chains are increasing.  SPEP revealed 0.5 gm/dL monoclonal protein then 1.3  gm/dL.  Bone survey reveals increase conspicuity of subcentimeter lytic lesions in the calvarium.  Bone marrow reveals 30-40% plasma cells.   MUGA on 11/30/2015 revealed an ejection fraction of 46%.  He is felt not to be a good candidate for Kyprolis.  There were no focal wall motion abnormalities.  He had a stress echo less than 1 year ago.  He has a history of PVCs and atrial fibrillation.  He takes Cardizem prn.  He is s/p 17 weeks of daratumumab (Darzalex) (12/09/2015 -  05/25/2016).  He tolerated treatment well without side effect.  Bone marrow on 02/09/2016 revealed no diagnostic morphologic evidence of plasma cell myeloma.  Marrow was normocellular to hypocellular marrow for age (ranging from 10-40%) with maturing trilineage hematopoiesis and mild multilineage dyspoiesis.  There was patchy mild increase in reticulin.  Storage iron was present.  Flow cytometry revealed no definitive evidence of monoclonality.  There was a non-specific atypical myeloid and monocytic findings with no increase in blasts.  Cytogenetics were normal (46, XY).  He is currently day 16 s/p 2nd autologous stem cell transplant on 06/16/2016.  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  He also experienced atrial fibrillation with intermittent episodes of RVR requiring IV beta blockers.  He is on prophylactic valacyclovir for 1 year post transplant.  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  Work-up on 08/23/2016 revealed the following normal studies:  B12 and folate.  Ferritin was 1379 (high).  Iron studies included a saturation of 56% and TIBC 141 (low).  ESR was 64.  Thrombocytopenia is improving slowly post transplant.  Platelet count is 159,000.  Symptomatically, he feels "ok".  He has xerostomia.  Weight is fluctuating.  He is orthostatic today.  He has occasional nausea.  Counts continue to improve slowly.  Creatinine is 1.21.  Magnesium is 1.7.  Plan:  1.  Labs today:  CBC with diff, BMP, Mg. 2.  Check orthostatics.  IVF today. 3.  RTC in 1 week for labs (BMP, Mg) +/- IV magnesium. 4.  RTC in 2 weeks for MD assessment, labs (CBC with diff, CMP, Mg, SPEP, free light chains) +/- IVF and magnesium.   Lequita Asal, MD  09/25/2016, 10:55 AM

## 2016-09-25 ENCOUNTER — Inpatient Hospital Stay: Payer: Medicare Other

## 2016-09-25 ENCOUNTER — Encounter: Payer: Self-pay | Admitting: Hematology and Oncology

## 2016-09-25 ENCOUNTER — Inpatient Hospital Stay (HOSPITAL_BASED_OUTPATIENT_CLINIC_OR_DEPARTMENT_OTHER): Payer: Medicare Other | Admitting: Hematology and Oncology

## 2016-09-25 VITALS — BP 94/56 | HR 105 | Temp 97.1°F | Resp 18 | Wt 144.2 lb

## 2016-09-25 VITALS — BP 107/76 | HR 120

## 2016-09-25 DIAGNOSIS — C9002 Multiple myeloma in relapse: Secondary | ICD-10-CM | POA: Diagnosis not present

## 2016-09-25 DIAGNOSIS — Z800 Family history of malignant neoplasm of digestive organs: Secondary | ICD-10-CM

## 2016-09-25 DIAGNOSIS — G8929 Other chronic pain: Secondary | ICD-10-CM

## 2016-09-25 DIAGNOSIS — Z87311 Personal history of (healed) other pathological fracture: Secondary | ICD-10-CM

## 2016-09-25 DIAGNOSIS — K219 Gastro-esophageal reflux disease without esophagitis: Secondary | ICD-10-CM

## 2016-09-25 DIAGNOSIS — R5383 Other fatigue: Secondary | ICD-10-CM

## 2016-09-25 DIAGNOSIS — C9001 Multiple myeloma in remission: Secondary | ICD-10-CM

## 2016-09-25 DIAGNOSIS — D696 Thrombocytopenia, unspecified: Secondary | ICD-10-CM | POA: Diagnosis not present

## 2016-09-25 DIAGNOSIS — Z9484 Stem cells transplant status: Secondary | ICD-10-CM

## 2016-09-25 DIAGNOSIS — D638 Anemia in other chronic diseases classified elsewhere: Secondary | ICD-10-CM

## 2016-09-25 DIAGNOSIS — I10 Essential (primary) hypertension: Secondary | ICD-10-CM

## 2016-09-25 DIAGNOSIS — Z79899 Other long term (current) drug therapy: Secondary | ICD-10-CM

## 2016-09-25 DIAGNOSIS — I951 Orthostatic hypotension: Secondary | ICD-10-CM

## 2016-09-25 DIAGNOSIS — C9000 Multiple myeloma not having achieved remission: Secondary | ICD-10-CM

## 2016-09-25 DIAGNOSIS — M5489 Other dorsalgia: Secondary | ICD-10-CM

## 2016-09-25 DIAGNOSIS — E440 Moderate protein-calorie malnutrition: Secondary | ICD-10-CM

## 2016-09-25 DIAGNOSIS — Z8673 Personal history of transient ischemic attack (TIA), and cerebral infarction without residual deficits: Secondary | ICD-10-CM

## 2016-09-25 DIAGNOSIS — R197 Diarrhea, unspecified: Secondary | ICD-10-CM

## 2016-09-25 DIAGNOSIS — Z8739 Personal history of other diseases of the musculoskeletal system and connective tissue: Secondary | ICD-10-CM

## 2016-09-25 DIAGNOSIS — R682 Dry mouth, unspecified: Secondary | ICD-10-CM

## 2016-09-25 DIAGNOSIS — R110 Nausea: Secondary | ICD-10-CM

## 2016-09-25 DIAGNOSIS — E785 Hyperlipidemia, unspecified: Secondary | ICD-10-CM

## 2016-09-25 DIAGNOSIS — I493 Ventricular premature depolarization: Secondary | ICD-10-CM

## 2016-09-25 DIAGNOSIS — Z7901 Long term (current) use of anticoagulants: Secondary | ICD-10-CM

## 2016-09-25 DIAGNOSIS — Z86711 Personal history of pulmonary embolism: Secondary | ICD-10-CM

## 2016-09-25 DIAGNOSIS — N400 Enlarged prostate without lower urinary tract symptoms: Secondary | ICD-10-CM

## 2016-09-25 DIAGNOSIS — K117 Disturbances of salivary secretion: Secondary | ICD-10-CM

## 2016-09-25 DIAGNOSIS — Z87891 Personal history of nicotine dependence: Secondary | ICD-10-CM

## 2016-09-25 LAB — CBC WITH DIFFERENTIAL/PLATELET
Basophils Absolute: 0 10*3/uL (ref 0–0.1)
Basophils Relative: 0 %
Eosinophils Absolute: 0.1 10*3/uL (ref 0–0.7)
Eosinophils Relative: 2 %
HCT: 30.2 % — ABNORMAL LOW (ref 40.0–52.0)
Hemoglobin: 10.2 g/dL — ABNORMAL LOW (ref 13.0–18.0)
Lymphocytes Relative: 14 %
Lymphs Abs: 1.2 10*3/uL (ref 1.0–3.6)
MCH: 37.4 pg — ABNORMAL HIGH (ref 26.0–34.0)
MCHC: 33.7 g/dL (ref 32.0–36.0)
MCV: 110.8 fL — ABNORMAL HIGH (ref 80.0–100.0)
Monocytes Absolute: 1 10*3/uL (ref 0.2–1.0)
Monocytes Relative: 12 %
Neutro Abs: 6.1 10*3/uL (ref 1.4–6.5)
Neutrophils Relative %: 72 %
Platelets: 159 10*3/uL (ref 150–440)
RBC: 2.72 MIL/uL — ABNORMAL LOW (ref 4.40–5.90)
RDW: 20.4 % — ABNORMAL HIGH (ref 11.5–14.5)
WBC: 8.5 10*3/uL (ref 3.8–10.6)

## 2016-09-25 LAB — BASIC METABOLIC PANEL
Anion gap: 8 (ref 5–15)
BUN: 13 mg/dL (ref 6–20)
CO2: 21 mmol/L — ABNORMAL LOW (ref 22–32)
Calcium: 9.2 mg/dL (ref 8.9–10.3)
Chloride: 110 mmol/L (ref 101–111)
Creatinine, Ser: 1.25 mg/dL — ABNORMAL HIGH (ref 0.61–1.24)
GFR calc Af Amer: >60 mL/min (ref >60)
GFR calc non Af Amer: 57 mL/min — ABNORMAL LOW (ref >60)
Glucose, Bld: 87 mg/dL (ref 65–99)
Potassium: 3.9 mmol/L (ref 3.5–5.1)
Sodium: 139 mmol/L (ref 135–145)

## 2016-09-25 LAB — MAGNESIUM: Magnesium: 1.7 mg/dL (ref 1.7–2.4)

## 2016-09-25 MED ORDER — SODIUM CHLORIDE 0.9 % IV SOLN
INTRAVENOUS | Status: AC
Start: 2016-09-25 — End: 2016-09-25
  Administered 2016-09-25: 11:00:00 via INTRAVENOUS
  Filled 2016-09-25: qty 1000

## 2016-09-25 NOTE — Progress Notes (Signed)
Orthostatic VS -  Lying 117/76  HR 82 Sitting 112/79 HR 104 Standing 103/66 HR 110  MD made aware.

## 2016-09-25 NOTE — Telephone Encounter (Signed)
Called patient r/t low BP.  Dr. Mike Gip spoke with pts PCP that will work with the patient on his BP meds.  Patient instructed to increase PO fluid intake, voiced understanding, patient instructed to check BP twice daily and keep log, voiced understanding. Patient to check with PCP about follow up appointment.

## 2016-09-26 ENCOUNTER — Other Ambulatory Visit: Payer: Self-pay | Admitting: Hematology and Oncology

## 2016-09-26 DIAGNOSIS — Z9484 Stem cells transplant status: Secondary | ICD-10-CM | POA: Insufficient documentation

## 2016-09-29 ENCOUNTER — Telehealth: Payer: Self-pay | Admitting: *Deleted

## 2016-09-29 MED ORDER — MAGNESIUM OXIDE 400 (241.3 MG) MG PO TABS: 400.0000 mg | ORAL_TABLET | Freq: Every day | ORAL | 0 refills | Status: DC

## 2016-09-29 NOTE — Telephone Encounter (Signed)
Called patient and left message for him on his home and cell phone that his magnesium level was low and that we had called in magnesium 400 mg 1 tablet daily and that we would see him next week on his followup.  Patient informed to call the clinic with questions.

## 2016-10-03 ENCOUNTER — Inpatient Hospital Stay: Payer: Medicare Other

## 2016-10-03 DIAGNOSIS — R682 Dry mouth, unspecified: Secondary | ICD-10-CM

## 2016-10-03 DIAGNOSIS — C9002 Multiple myeloma in relapse: Secondary | ICD-10-CM

## 2016-10-03 DIAGNOSIS — K117 Disturbances of salivary secretion: Secondary | ICD-10-CM

## 2016-10-03 DIAGNOSIS — C9001 Multiple myeloma in remission: Secondary | ICD-10-CM

## 2016-10-03 LAB — BASIC METABOLIC PANEL
Anion gap: 7 (ref 5–15)
BUN: 12 mg/dL (ref 6–20)
CO2: 21 mmol/L — ABNORMAL LOW (ref 22–32)
Calcium: 7.8 mg/dL — ABNORMAL LOW (ref 8.9–10.3)
Chloride: 109 mmol/L (ref 101–111)
Creatinine, Ser: 1.09 mg/dL (ref 0.61–1.24)
GFR calc Af Amer: >60 mL/min (ref >60)
GFR calc non Af Amer: >60 mL/min (ref >60)
Glucose, Bld: 107 mg/dL — ABNORMAL HIGH (ref 65–99)
Potassium: 3.2 mmol/L — ABNORMAL LOW (ref 3.5–5.1)
Sodium: 137 mmol/L (ref 135–145)

## 2016-10-03 LAB — CBC WITH DIFFERENTIAL/PLATELET
Basophils Absolute: 0 10*3/uL (ref 0–0.1)
Basophils Relative: 0 %
Eosinophils Absolute: 0.1 10*3/uL (ref 0–0.7)
Eosinophils Relative: 2 %
HCT: 27.1 % — ABNORMAL LOW (ref 40.0–52.0)
Hemoglobin: 9.2 g/dL — ABNORMAL LOW (ref 13.0–18.0)
Lymphocytes Relative: 14 %
Lymphs Abs: 1.1 10*3/uL (ref 1.0–3.6)
MCH: 37.9 pg — ABNORMAL HIGH (ref 26.0–34.0)
MCHC: 34 g/dL (ref 32.0–36.0)
MCV: 111.6 fL — ABNORMAL HIGH (ref 80.0–100.0)
Monocytes Absolute: 0.8 10*3/uL (ref 0.2–1.0)
Monocytes Relative: 11 %
Neutro Abs: 5.6 10*3/uL (ref 1.4–6.5)
Neutrophils Relative %: 73 %
Platelets: 130 10*3/uL — ABNORMAL LOW (ref 150–440)
RBC: 2.42 MIL/uL — ABNORMAL LOW (ref 4.40–5.90)
RDW: 18.1 % — ABNORMAL HIGH (ref 11.5–14.5)
WBC: 7.7 10*3/uL (ref 3.8–10.6)

## 2016-10-03 LAB — MAGNESIUM: Magnesium: 1.1 mg/dL — ABNORMAL LOW (ref 1.7–2.4)

## 2016-10-03 MED ORDER — SODIUM CHLORIDE 0.9 % IV SOLN
Freq: Once | INTRAVENOUS | Status: AC
  Administered 2016-10-03: 14:00:00 via INTRAVENOUS
  Filled 2016-10-03: qty 1000

## 2016-10-03 MED ORDER — MAGNESIUM SULFATE 4 GM/100ML IV SOLN
4.0000 g | Freq: Once | INTRAVENOUS | Status: AC
  Administered 2016-10-03: 4 g via INTRAVENOUS
  Filled 2016-10-03: qty 100

## 2016-10-03 MED FILL — Medication: INTRATHECAL | Qty: 1 | Status: AC

## 2016-10-04 ENCOUNTER — Telehealth: Payer: Self-pay | Admitting: *Deleted

## 2016-10-04 NOTE — Telephone Encounter (Signed)
Per Dr Marijo Sanes, please have him contact cardiology regarding this. I returned call to wife and explained this to her and she said she will call cardiologist.

## 2016-10-04 NOTE — Telephone Encounter (Signed)
He has gained 7 pounds in past 2 weeks and has pitting edema in bilateral lower extremities up to the knees.This is a Dr Mike Gip patient with Multiple Myeloma s/p autologous BMT times 2. Most recent transplant was 3.5 months ago  Please advise

## 2016-10-10 ENCOUNTER — Inpatient Hospital Stay: Payer: Medicare Other | Admitting: Hematology and Oncology

## 2016-10-10 ENCOUNTER — Inpatient Hospital Stay: Payer: Medicare Other

## 2016-10-11 ENCOUNTER — Inpatient Hospital Stay: Payer: Medicare Other

## 2016-10-11 ENCOUNTER — Inpatient Hospital Stay: Payer: Medicare Other | Attending: Hematology and Oncology

## 2016-10-11 ENCOUNTER — Other Ambulatory Visit: Payer: Self-pay | Admitting: *Deleted

## 2016-10-11 ENCOUNTER — Inpatient Hospital Stay (HOSPITAL_BASED_OUTPATIENT_CLINIC_OR_DEPARTMENT_OTHER): Payer: Medicare Other | Admitting: Hematology and Oncology

## 2016-10-11 VITALS — BP 138/82 | HR 86 | Temp 96.0°F | Resp 18 | Wt 152.6 lb

## 2016-10-11 DIAGNOSIS — D638 Anemia in other chronic diseases classified elsewhere: Secondary | ICD-10-CM

## 2016-10-11 DIAGNOSIS — Z9484 Stem cells transplant status: Secondary | ICD-10-CM | POA: Insufficient documentation

## 2016-10-11 DIAGNOSIS — Z8042 Family history of malignant neoplasm of prostate: Secondary | ICD-10-CM | POA: Insufficient documentation

## 2016-10-11 DIAGNOSIS — C9001 Multiple myeloma in remission: Secondary | ICD-10-CM

## 2016-10-11 DIAGNOSIS — G8929 Other chronic pain: Secondary | ICD-10-CM | POA: Diagnosis not present

## 2016-10-11 DIAGNOSIS — C9002 Multiple myeloma in relapse: Secondary | ICD-10-CM

## 2016-10-11 DIAGNOSIS — Z8673 Personal history of transient ischemic attack (TIA), and cerebral infarction without residual deficits: Secondary | ICD-10-CM | POA: Diagnosis not present

## 2016-10-11 DIAGNOSIS — R682 Dry mouth, unspecified: Secondary | ICD-10-CM

## 2016-10-11 DIAGNOSIS — I493 Ventricular premature depolarization: Secondary | ICD-10-CM

## 2016-10-11 DIAGNOSIS — E785 Hyperlipidemia, unspecified: Secondary | ICD-10-CM

## 2016-10-11 DIAGNOSIS — Z8052 Family history of malignant neoplasm of bladder: Secondary | ICD-10-CM | POA: Diagnosis not present

## 2016-10-11 DIAGNOSIS — D696 Thrombocytopenia, unspecified: Secondary | ICD-10-CM | POA: Diagnosis not present

## 2016-10-11 DIAGNOSIS — Z86711 Personal history of pulmonary embolism: Secondary | ICD-10-CM | POA: Diagnosis not present

## 2016-10-11 DIAGNOSIS — I4891 Unspecified atrial fibrillation: Secondary | ICD-10-CM | POA: Insufficient documentation

## 2016-10-11 DIAGNOSIS — K219 Gastro-esophageal reflux disease without esophagitis: Secondary | ICD-10-CM | POA: Diagnosis not present

## 2016-10-11 DIAGNOSIS — Z87891 Personal history of nicotine dependence: Secondary | ICD-10-CM

## 2016-10-11 DIAGNOSIS — Z7901 Long term (current) use of anticoagulants: Secondary | ICD-10-CM | POA: Diagnosis not present

## 2016-10-11 DIAGNOSIS — Z87311 Personal history of (healed) other pathological fracture: Secondary | ICD-10-CM | POA: Diagnosis not present

## 2016-10-11 DIAGNOSIS — K117 Disturbances of salivary secretion: Secondary | ICD-10-CM | POA: Insufficient documentation

## 2016-10-11 DIAGNOSIS — M549 Dorsalgia, unspecified: Secondary | ICD-10-CM | POA: Diagnosis not present

## 2016-10-11 DIAGNOSIS — I10 Essential (primary) hypertension: Secondary | ICD-10-CM

## 2016-10-11 DIAGNOSIS — Z79899 Other long term (current) drug therapy: Secondary | ICD-10-CM

## 2016-10-11 DIAGNOSIS — N400 Enlarged prostate without lower urinary tract symptoms: Secondary | ICD-10-CM | POA: Diagnosis not present

## 2016-10-11 DIAGNOSIS — Z8051 Family history of malignant neoplasm of kidney: Secondary | ICD-10-CM | POA: Diagnosis not present

## 2016-10-11 LAB — COMPREHENSIVE METABOLIC PANEL
ALT: 10 U/L — ABNORMAL LOW (ref 17–63)
AST: 16 U/L (ref 15–41)
Albumin: 3.5 g/dL (ref 3.5–5.0)
Alkaline Phosphatase: 44 U/L (ref 38–126)
Anion gap: 7 (ref 5–15)
BUN: 10 mg/dL (ref 6–20)
CO2: 23 mmol/L (ref 22–32)
Calcium: 8.4 mg/dL — ABNORMAL LOW (ref 8.9–10.3)
Chloride: 107 mmol/L (ref 101–111)
Creatinine, Ser: 1.08 mg/dL (ref 0.61–1.24)
GFR calc Af Amer: >60 mL/min (ref >60)
GFR calc non Af Amer: >60 mL/min (ref >60)
Glucose, Bld: 105 mg/dL — ABNORMAL HIGH (ref 65–99)
Potassium: 3.5 mmol/L (ref 3.5–5.1)
Sodium: 137 mmol/L (ref 135–145)
Total Bilirubin: 0.7 mg/dL (ref 0.3–1.2)
Total Protein: 5.7 g/dL — ABNORMAL LOW (ref 6.5–8.1)

## 2016-10-11 LAB — CBC WITH DIFFERENTIAL/PLATELET
Basophils Absolute: 0 10*3/uL (ref 0–0.1)
Basophils Relative: 0 %
Eosinophils Absolute: 0.2 10*3/uL (ref 0–0.7)
Eosinophils Relative: 3 %
HCT: 27.7 % — ABNORMAL LOW (ref 40.0–52.0)
Hemoglobin: 9.3 g/dL — ABNORMAL LOW (ref 13.0–18.0)
Lymphocytes Relative: 14 %
Lymphs Abs: 0.9 10*3/uL — ABNORMAL LOW (ref 1.0–3.6)
MCH: 37.7 pg — ABNORMAL HIGH (ref 26.0–34.0)
MCHC: 33.6 g/dL (ref 32.0–36.0)
MCV: 112.1 fL — ABNORMAL HIGH (ref 80.0–100.0)
Monocytes Absolute: 0.9 10*3/uL (ref 0.2–1.0)
Monocytes Relative: 15 %
Neutro Abs: 4.4 10*3/uL (ref 1.4–6.5)
Neutrophils Relative %: 68 %
Platelets: 121 10*3/uL — ABNORMAL LOW (ref 150–440)
RBC: 2.47 MIL/uL — ABNORMAL LOW (ref 4.40–5.90)
RDW: 17 % — ABNORMAL HIGH (ref 11.5–14.5)
WBC: 6.4 10*3/uL (ref 3.8–10.6)

## 2016-10-11 LAB — MAGNESIUM: Magnesium: 1.2 mg/dL — ABNORMAL LOW (ref 1.7–2.4)

## 2016-10-11 MED ORDER — MAGNESIUM SULFATE 4 GM/100ML IV SOLN
4.0000 g | Freq: Once | INTRAVENOUS | Status: AC
  Administered 2016-10-11: 4 g via INTRAVENOUS
  Filled 2016-10-11: qty 100

## 2016-10-11 MED ORDER — SODIUM CHLORIDE 0.9 % IV SOLN
Freq: Once | INTRAVENOUS | Status: AC
  Administered 2016-10-11: 11:00:00 via INTRAVENOUS
  Filled 2016-10-11: qty 1000

## 2016-10-11 NOTE — Progress Notes (Signed)
[facility] day:  10/11/16  Chief Complaint: Logan Perez is a 71 y.o. 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  HPI: The patient was last seen in the medical oncology clinic on 09/25/2016.  At that time, he was orthostatic.  He received IV fluids.  Magnesium was normal.  Counts were improving.  Labs on 10/03/2016 revealed a hematocrit of 27.1, hemoglobin 9.2, MCV 111.6, platelets 130,000, WBC 7700 with an [place] of 5600.  Creatinine was 1.09 (improved).  Magnesium was 1.1.  He received 4 gm of magnesium sulfate.  He was seen for his day 100 evaluation at [facility] on 09/26/2016 by Dr. Ok Edwards.  He received his influenza vaccine.  Vaccines to start in 3 months.  Discussions were held regarding maintenance therapy with Velcade or carfilzomib.  During the interim, he felt better. Sometimes he gets tired and lays on the couch. He is keeping track of his fluids. He is drinking 1 quart a day. Physical therapy is going well.   Past Medical History:  Diagnosis Date  . Anxiety   . Atrial fibrillation ([place])   . BPH (benign prostatic hyperplasia)   . Complication of anesthesia    BAD HEADACHE NIGHT OF FIRST CATARACT  . Difficulty voiding   . Dysrhythmia    A FIB  . Elevated PSA   . GERD (gastroesophageal reflux disease)   . HLD (hyperlipidemia)   . HOH (hard of hearing)   . Hypertension   . Multiple myeloma ([place])   . Neuropathy ([place])   . Pain    BACK  . Palpitations   . Pneumonia   . Pulmonary embolism ([place])   . Stroke [facility])    TIA    Past Surgical History:  Procedure Laterality Date  . BACK SURGERY  1994  . CATARACT EXTRACTION W/PHACO Right 05/02/2016   Procedure: CATARACT EXTRACTION PHACO AND INTRAOCULAR LENS PLACEMENT (IOC);  Surgeon: Birder Robson, MD;  Location: [facility] ORS;  Service: Ophthalmology;  Laterality:  Right;  [place] 1.06 AP% 20.6 CDE 13.70 FLUID PACK LOT # [id number]  . CATARACT EXTRACTION W/PHACO Left 05/16/2016   Procedure: CATARACT EXTRACTION PHACO AND INTRAOCULAR LENS PLACEMENT (IOC);  Surgeon: Birder Robson, MD;  Location: [facility] ORS;  Service: Ophthalmology;  Laterality: Left;  [place] 01:43 AP% 19.8 CDE 20.45 FLUID PACK LOT #[id number] H  . KNEE ARTHROSCOPY Left 1992  . LIMBAL STEM CELL TRANSPLANT    . PAIN PUMP IMPLANTATION  2012  . stem cell implant  2008   [facility]    Family History  Problem Relation Age of Onset  . Cancer Father     throat  . Kidney disease Sister   . Stroke    . Bladder Cancer Neg Hx   . Prostate cancer Neg Hx     Social History:  reports that he has quit smoking. His smoking use included Cigarettes. He has a 30.00 pack-year smoking history. He has quit using smokeless tobacco. He reports that he does not drink alcohol. His drug history is not on file.  His wife's name is [name].  The patient is alone today.  Allergies:  Allergies  Allergen Reactions  . Azithromycin Diarrhea    Possible cause of C. Diff Possible cause of C. Diff Possible cause of C. Diff Possible cause of C. Diff Possible cause of C. Diff  . 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 Rivaroxaban Rash    Current Medications: Current Outpatient Prescriptions  Medication Sig Dispense Refill  . acetaminophen (TYLENOL) 500 MG tablet Take 1,000 mg by mouth.    . ALPRAZolam (XANAX) 0.25 MG tablet TAKE 1 TABLET BY MOUTH EVERY NIGHT AT BEDTIME AS NEEDED FOR ANXIETY. 30 tablet 3  . baclofen (LIORESAL) 10 MG tablet Take 1 tablet (10 mg total) by mouth at bedtime. 30 each 3  . calcium citrate-vitamin D (CITRACAL+D) 315-200 MG-UNIT per tablet Take 1 tablet by mouth daily.     . cetirizine (ZYRTEC) 10 MG tablet Take 10 mg by mouth  daily.     . Cholecalciferol (VITAMIN D3) 2000 UNITS capsule Take 2,000 Units by mouth daily.     . Cyanocobalamin (RA VITAMIN B-12 TR) 1000 MCG TBCR Take 1,000 mcg by mouth daily.     [name] diltiazem (CARDIZEM) 60 MG tablet Take 60 mg by mouth as needed.    . diltiazem (DILACOR XR) 120 MG 24 hr capsule Take 120 mg by mouth daily.    [name] ELIQUIS 5 MG TABS tablet TAKE 1 TABLET(5 MG) BY MOUTH TWICE DAILY 60 tablet 0  . fluticasone (FLONASE) 50 MCG/ACT nasal spray Place 1 spray into the nose daily as needed for allergies.     . Iron-Vitamins (GERITOL PO) Take by mouth.    . loperamide (IMODIUM) 2 MG capsule Take 2 mg by mouth as needed for diarrhea or loose stools. Reported on 04/26/2016    . lovastatin (MEVACOR) 20 MG tablet     . magnesium oxide (MAG-OX) 400 (241.3 Mg) MG tablet Take 1 tablet (400 mg total) by mouth daily. 30 tablet 0  . montelukast (SINGULAIR) 10 MG tablet TAKE 1 TABLET BY MOUTH ONCE A DAY AS DIRECTED. 30 tablet 0  . omeprazole (PRILOSEC) 20 MG capsule Take 20 mg by mouth daily.     . potassium chloride SA (K-DUR,KLOR-CON) 20 MEQ tablet TAKE 1 TABLET(20 MEQ) BY MOUTH TWICE DAILY 180 tablet 0  . prochlorperazine (COMPAZINE) 10 MG tablet Take 10 mg by mouth every 6 (six) hours as needed for nausea or vomiting.    . ranitidine (ZANTAC) 150 MG tablet Take 150 mg by mouth daily.     . tamsulosin (FLOMAX) 0.4 MG CAPS capsule Take 1 capsule (0.4 mg total) by mouth daily. 90 capsule 4  . valACYclovir (VALTREX) 500 MG tablet TAKE 1 TABLET BY MOUTH DAILY 30 tablet 0  . PAIN MANAGEMENT IT PUMP REFILL 1 each by Intrathecal route once. Medication: PF Fentanyl 1,500.0 mcg/ml PF Bupivicaine 30.0 mg/ml PF Clonidine 300.15mg/ml Total Volume: 40 ml Needed by 09-19-16 _0  1 each 0   No current facility-administered medications for this visit.     Review of Systems:  GENERAL: Feeling better. No fevers or sweats.  Weight up 8 pounds. PERFORMANCE STATUS (ECOG): 1 HEENT:  Cataracts s/p surgery.   Poor taste.  No sore throat, mouth sores or tenderness. Lungs:  No shortness of breath or cough.  No hemoptysis. Cardiac:  No chest pain, palpitations, orthopnea, or PND. GI:  Appetite good.  Nausea in afternoon managed with Compazine.  3 loose BM/day.  No vomiting, constipation, melena or hematochezia. GU:  No urgency, frequency, dysuria, or hematuria. Musculoskeletal:  Compression fracture of back on a pain pump. No muscle tenderness. Extremities:  No pain or  swelling. Skin:  Fragile skin.  No unexplained bruises.  No rashes or skin changes. Neuro:  Little tremor (chronic).  Neuropathy in feet.  No headache, weakness, balance or coordination issues. Endocrine:  No diabetes, thyroid issues, hot flashes or night sweats. Psych:  No mood changes, depression or anxiety. Pain:  Chronic back pain (pain well managed with pump). Review of systems:  All other systems reviewed and found to be negative.   Physical Exam: Blood pressure 138/82, pulse 86, temperature (!) 96 F (35.6 C), temperature source Tympanic, resp. rate 18, weight 152 lb 8.9 oz (69.2 kg). GENERAL:  Thin elderly gentleman sitting comfortably in the exam room in no acute distress.  He has a rolling walker at his side. MENTAL STATUS:  Alert and oriented to person, place and time. HEAD:  Wearing a herringbone golf cap.  Consuella Lose.  Temporal wasting.  Normocephalic, atraumatic, face symmetric, no Cushingoid features. EYES:  Glasses.  Blue eyes s/p cataract surgery.  Pupils equal round and reactive to light and accomodation.  No conjunctivitis or scleral icterus. ENT:  Hearing aide.  Oropharynx clear without lesion.  Hearing aide.  Tongue normal. Mucous membranes moist.  RESPIRATORY:  Clear to auscultation without rales, wheezes or rhonchi. CARDIOVASCULAR:  Regular rate and rhythm without murmur, rub or gallop. ABDOMEN:  RUQ pain pump.  Soft, non-tender, with active bowel sounds, and no hepatosplenomegaly.  No masses. SKIN:  No  rashes, ulcers or lesions. EXTREMITIES: 1+ lower extremity edema.  No skin discoloration or tenderness.  No palpable cords. LYMPH NODES: No palpable cervical, supraclavicular, axillary or inguinal adenopathy  NEUROLOGICAL: Intention tremor. PSYCH:  Appropriate.    Appointment on 10/11/2016  Component Date Value Ref Range Status  . WBC 10/11/2016 6.4  3.8 - 10.6 K/uL Final  . RBC 10/11/2016 2.47* 4.40 - 5.90 MIL/uL Final  . Hemoglobin 10/11/2016 9.3* 13.0 - 18.0 g/dL Final  . HCT 10/11/2016 27.7* 40.0 - 52.0 % Final  . MCV 10/11/2016 112.1* 80.0 - 100.0 fL Final  . MCH 10/11/2016 37.7* 26.0 - 34.0 pg Final  . MCHC 10/11/2016 33.6  32.0 - 36.0 g/dL Final  . RDW 10/11/2016 17.0* 11.5 - 14.5 % Final  . Platelets 10/11/2016 121* 150 - 440 K/uL Final  . Neutrophils Relative % 10/11/2016 68  % Final  . Neutro Abs 10/11/2016 4.4  1.4 - 6.5 K/uL Final  . Lymphocytes Relative 10/11/2016 14  % Final  . Lymphs Abs 10/11/2016 0.9* 1.0 - 3.6 K/uL Final  . Monocytes Relative 10/11/2016 15  % Final  . Monocytes Absolute 10/11/2016 0.9  0.2 - 1.0 K/uL Final  . Eosinophils Relative 10/11/2016 3  % Final  . Eosinophils Absolute 10/11/2016 0.2  0 - 0.7 K/uL Final  . Basophils Relative 10/11/2016 0  % Final  . Basophils Absolute 10/11/2016 0.0  0 - 0.1 K/uL Final  . Sodium 10/11/2016 137  135 - 145 mmol/L Final  . Potassium 10/11/2016 3.5  3.5 - 5.1 mmol/L Final  . Chloride 10/11/2016 107  101 - 111 mmol/L Final  . CO2 10/11/2016 23  22 - 32 mmol/L Final  . Glucose, Bld 10/11/2016 105* 65 - 99 mg/dL Final  . BUN 10/11/2016 10  6 - 20 mg/dL Final  . Creatinine, Ser 10/11/2016 1.08  0.61 - 1.24 mg/dL Final  . Calcium 10/11/2016 8.4* 8.9 - 10.3 mg/dL Final  . Total Protein 10/11/2016 5.7* 6.5 - 8.1 g/dL Final  . Albumin 10/11/2016 3.5  3.5 - 5.0 g/dL  Final  . AST 10/11/2016 16  15 - 41 U/L Final  . ALT 10/11/2016 10* 17 - 63 U/L Final  . Alkaline Phosphatase 10/11/2016 44  38 - 126 U/L Final  .  Total Bilirubin 10/11/2016 0.7  0.3 - 1.2 mg/dL Final  . GFR calc non Af Amer 10/11/2016 >60  >60 mL/min Final  . GFR calc Af Amer 10/11/2016 >60  >60 mL/min Final   Comment: (NOTE) The eGFR has been calculated using the CKD EPI equation. This calculation has not been validated in all clinical situations. eGFR's persistently <60 mL/min signify possible Chronic Kidney Disease.   . Anion gap 10/11/2016 7  5 - 15 Final  . Magnesium 10/11/2016 1.2* 1.7 - 2.4 mg/dL Final    Assessment:  Logan Perez is a 71 y.o. male with stage III mutiple myeloma.  He initially presented with progressive back pain beginning in 12/2006.  MRI revealed "spots and compression fractures".  He began Velcade, thalidomide, and Decadron.  In 08/2007, he underwent high dose chemotherapy and autologous stem cell transplant.  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  He recurred with a rising M-spike (2.7) with repeat M spike (1.7 gm/dl) in 03/2010.  He was initially treated with Velcade (02/08/2010 - 05/10/2010).  He then began Revlimid (15 mg 3 weeks on/1 week off) and Decadron (40 mg on day 1, 8, 15, 22).  Because of significant side effect with Decadron his dose was decreased to 10 mg once a week in 07/2010.    He was on maintenance Revlimid. Revlimid was initially10 mg 3 weeks on/1 week off. This was changed to 10 mg 2 weeks on/2 weeks off secondary to right nipple tenderness. His dose was increased to 10 mg 3 weeks on/1 week off with Decadron 10 mg a week (on Sundays) and then Revlamid 15 mg 3 weeks on and 1 week off with Decadron on Sundays.  He began Pomalyst 4 mg 3 weeks on/1 week off with Decadron on 08/27/2015.  Over the past year his SPEP has revealed no monoclonal protein (04/21/2015) and 0.5 gm/dL on 09/22/2015 and 10/20/2015. M spike was 0.1 on 02/02/2016, 03/01/2016, 03/29/2016, 04/26/2016, and 05/24/2016.  Free light chains have been monitored. Kappa free light chains were  18.54 on 11/21/2013, 18.37 on 02/20/2014, 18.93 on 05/22/2014, 32.58 (high; normal ratio 1.27) on 08/21/2014, 51.53 (high; elevated ratio of 2.12) on 11/13/2014, 28.08 (ratio 1.73) on 12/09/2014, 23.71 (ratio 2.17) on 01/18/2015, 92.93 (ratio 9.49) on 04/21/2015, 93.44 (ratio 10.28) on 05/26/2015, 255.45 (ratio of 24.05) on 07/14/2015, 373.89 (ratio 48.31) on 08/04/2015, 474.33 (ratio 70.58) on 08/25/2015, 450.76 (ratio 55.44) on 09/22/2015, 453.4 (ratio 59.89) on 10/20/2015, 58.26 (ratio of > 40.74) on 02/02/2016, 62.67 (ratio of 37.98) on 03/01/2016, 75.7 (ratio > 50.47) on 03/29/2016, 79.7 (ratio 53.13) on 04/26/2016, and 108.6 (ratio > 72.4) on 05/24/2016.  Bone survey on 12/08/2014 was stable.  Bone survey on 10/21/2015 revealed increase conspicuity of subcentimeter lytic lesions in the calvarium.  Bone marrow aspirate and biopsy on 11/04/2015 revealed an atypical monoclonal plasma cells estimated at 30-40% of marrow cells.   Marrow was variably cellular (approximately 45%) with background trilineage hematopoiesis. There was no significant increase in marrow reticulin fibers. Storage iron was present.    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 He develoed herpes zoster in 04/2008. He developed a pulmonary embolism in 05/2013. He was initially on Xarelto, but is now on Eliquis. He had  an episode of pneumonia around this time requiring a brief admission. He developed severe lower leg cramps on 08/07/2014 secondary to hypokalemia. Duplex was negative.   He was treated for C difficile colitis (Flagyl completed 07/30/2015).  He has a chronic indwelling pain pump.  He received 4 cycles of Pomalyst and Decadron (08/27/2015 - 11/19/2015).  Restaging studies document progressive disease.  Kappa free light chains are increasing.  SPEP revealed 0.5 gm/dL monoclonal protein then 1.3 gm/dL.  Bone survey reveals increase conspicuity of subcentimeter  lytic lesions in the calvarium.  Bone marrow reveals 30-40% plasma cells.   MUGA on 11/30/2015 revealed an ejection fraction of 46%.  He is felt not to be a good candidate for Kyprolis.  There were no focal wall motion abnormalities.  He had a stress echo less than 1 year ago.  He has a history of PVCs and atrial fibrillation.  He takes Cardizem prn.  He is s/p 17 weeks of daratumumab (Darzalex) (12/09/2015 - 05/25/2016).  He tolerated treatment well without side effect.  Bone marrow on 02/09/2016 revealed no diagnostic morphologic evidence of plasma cell myeloma.  Marrow was normocellular to hypocellular marrow for age (ranging from 10-40%) with maturing trilineage hematopoiesis and mild multilineage dyspoiesis.  There was patchy mild increase in reticulin.  Storage iron was present.  Flow cytometry revealed no definitive evidence of monoclonality.  There was a non-specific atypical myeloid and monocytic findings with no increase in blasts.  Cytogenetics were normal (46, XY).  He is currently day 113 s/p 2nd autologous stem cell transplant on 06/16/2016.  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  He also experienced atrial fibrillation with intermittent episodes of RVR requiring IV beta blockers.  He is on prophylactic valacyclovir for 1 year post transplant.  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  Work-up on 08/23/2016 revealed the following normal studies:  B12 and folate.  Ferritin was 1379 (high).  Iron studies included a saturation of 56% and TIBC 141 (low).  ESR was 64.  Thrombocytopenia is improving slowly post transplant.  Platelet count is 121,000.  Symptomatically, he feels "ok".  He has xerostomia.  Weight has improved.  He has occasional nausea.  Counts continue to improve slowly.  Creatinine is 1.08.  Magnesium is 1.2.  Plan:  1.  Labs today:  CBC with diff, BMP, Mg, SPEP, free light chains. 2.  Magnesium  sulfate 4 gm IV today. 3.  Patient to schedule appointment with Dr. Nehemiah Massed 4.  Phone follow-up with Dr. Adriana Simas. 5.  RTC in 1 week for labs (BMP, Mg) +/- IV magnesium. 6.  RTC in 2 weeks for MD assessment, labs (CBC with diff, Mg, SPEP) +/- IVF and magnesium.   Lequita Asal, MD  10/11/2016, 10:06 AM

## 2016-10-11 NOTE — Progress Notes (Signed)
Patient states he has increased his water intake and is feeling much better.  PCP had him journal his BP at home.  Patient brought copy and the numbers are very good. Also states he continues to have bouts with diarrhea.  States he will go several times in a row and then he is finished.  This happened to him yesterday.

## 2016-10-12 ENCOUNTER — Telehealth: Payer: Self-pay | Admitting: *Deleted

## 2016-10-12 LAB — PROTEIN ELECTROPHORESIS, SERUM
A/G Ratio: 1.7 (ref 0.7–1.7)
Albumin ELP: 3.2 g/dL (ref 2.9–4.4)
Alpha-1-Globulin: 0.2 g/dL (ref 0.0–0.4)
Alpha-2-Globulin: 0.7 g/dL (ref 0.4–1.0)
Beta Globulin: 0.7 g/dL (ref 0.7–1.3)
Gamma Globulin: 0.4 g/dL (ref 0.4–1.8)
Globulin, Total: 1.9 g/dL — ABNORMAL LOW (ref 2.2–3.9)
PDF: 0
Total Protein ELP: 5.1 g/dL — ABNORMAL LOW (ref 6.0–8.5)

## 2016-10-12 LAB — KAPPA/LAMBDA LIGHT CHAINS
Kappa free light chain: 8.3 mg/L (ref 3.3–19.4)
Kappa, lambda light chain ratio: 1.46 (ref 0.26–1.65)
Lambda free light chains: 5.7 mg/L (ref 5.7–26.3)

## 2016-10-12 NOTE — Telephone Encounter (Signed)
Called and spoke to patient's wife to inform her that M spike and free light chains are normal.

## 2016-10-12 NOTE — Telephone Encounter (Signed)
-----   Message from Lequita Asal, MD sent at 10/12/2016  3:49 PM EST ----- Regarding: Please call patient  M spike and free light chains are normal !  M  ----- Message ----- From: Interface, Lab In [place] Sent: 10/11/2016   9:36 AM To: Lequita Asal, MD

## 2016-10-16 ENCOUNTER — Other Ambulatory Visit: Payer: Self-pay | Admitting: Hematology and Oncology

## 2016-10-17 ENCOUNTER — Inpatient Hospital Stay: Payer: Medicare Other

## 2016-10-17 DIAGNOSIS — C9002 Multiple myeloma in relapse: Secondary | ICD-10-CM | POA: Diagnosis not present

## 2016-10-17 DIAGNOSIS — C9001 Multiple myeloma in remission: Secondary | ICD-10-CM

## 2016-10-17 LAB — BASIC METABOLIC PANEL
Anion gap: 7 (ref 5–15)
BUN: 10 mg/dL (ref 6–20)
CO2: 23 mmol/L (ref 22–32)
Calcium: 8.7 mg/dL — ABNORMAL LOW (ref 8.9–10.3)
Chloride: 107 mmol/L (ref 101–111)
Creatinine, Ser: 1.08 mg/dL (ref 0.61–1.24)
GFR calc Af Amer: >60 mL/min (ref >60)
GFR calc non Af Amer: >60 mL/min (ref >60)
Glucose, Bld: 98 mg/dL (ref 65–99)
Potassium: 4 mmol/L (ref 3.5–5.1)
Sodium: 137 mmol/L (ref 135–145)

## 2016-10-17 LAB — MAGNESIUM: Magnesium: 1.4 mg/dL — ABNORMAL LOW (ref 1.7–2.4)

## 2016-10-18 ENCOUNTER — Other Ambulatory Visit: Payer: Medicare Other

## 2016-10-18 ENCOUNTER — Ambulatory Visit: Payer: Medicare Other | Attending: Hematology and Oncology | Admitting: Physical Therapy

## 2016-10-18 ENCOUNTER — Other Ambulatory Visit: Payer: Self-pay | Admitting: Hematology and Oncology

## 2016-10-18 ENCOUNTER — Inpatient Hospital Stay: Payer: Medicare Other

## 2016-10-18 DIAGNOSIS — R2689 Other abnormalities of gait and mobility: Secondary | ICD-10-CM | POA: Insufficient documentation

## 2016-10-18 DIAGNOSIS — R293 Abnormal posture: Secondary | ICD-10-CM

## 2016-10-18 DIAGNOSIS — M6281 Muscle weakness (generalized): Secondary | ICD-10-CM | POA: Diagnosis present

## 2016-10-18 DIAGNOSIS — R262 Difficulty in walking, not elsewhere classified: Secondary | ICD-10-CM | POA: Diagnosis present

## 2016-10-18 DIAGNOSIS — C9002 Multiple myeloma in relapse: Secondary | ICD-10-CM | POA: Diagnosis not present

## 2016-10-18 MED ORDER — MAGNESIUM SULFATE 4 GM/100ML IV SOLN
4.0000 g | Freq: Once | INTRAVENOUS | Status: AC
  Administered 2016-10-18: 4 g via INTRAVENOUS
  Filled 2016-10-18: qty 100

## 2016-10-19 ENCOUNTER — Encounter: Payer: Self-pay | Admitting: Physical Therapy

## 2016-10-19 NOTE — Therapy (Signed)
[facility] [address]. [place], [place], [address] Phone: [phone]   Fax:  [phone]  Physical Therapy Evaluation  Patient Details  Name: Logan Perez MRN: [id number] Date of Birth: 1946-10-01 Referring Provider: Nolon Stalls, MD  Encounter Date: 10/18/2016      PT End of Session - 10/19/16 1731    Visit Number 1   Number of Visits 8   Date for PT Re-Evaluation 11/15/16   Authorization - Visit Number 1   Authorization - Number of Visits 10   PT Start Time 1325   PT Stop Time 1431   PT Time Calculation (min) 66 min   Equipment Utilized During Treatment Gait belt   Activity Tolerance Patient tolerated treatment well   Behavior During Therapy [facility] for tasks assessed/performed      Past Medical History:  Diagnosis Date  . Anxiety   . Atrial fibrillation ([place])   . BPH (benign prostatic hyperplasia)   . Complication of anesthesia    BAD HEADACHE NIGHT OF FIRST CATARACT  . Difficulty voiding   . Dysrhythmia    A FIB  . Elevated PSA   . GERD (gastroesophageal reflux disease)   . HLD (hyperlipidemia)   . HOH (hard of hearing)   . Hypertension   . Multiple myeloma ([place])   . Neuropathy ([place])   . Pain    BACK  . Palpitations   . Pneumonia   . Pulmonary embolism ([place])   . Stroke [facility])    TIA    Past Surgical History:  Procedure Laterality Date  . BACK SURGERY  1994  . CATARACT EXTRACTION W/PHACO Right 05/02/2016   Procedure: CATARACT EXTRACTION PHACO AND INTRAOCULAR LENS PLACEMENT (IOC);  Surgeon: Birder Robson, MD;  Location: [facility] ORS;  Service: Ophthalmology;  Laterality: Right;  [place] 1.06 AP% 20.6 CDE 13.70 FLUID PACK LOT # [id number]  . CATARACT EXTRACTION W/PHACO Left 05/16/2016   Procedure: CATARACT EXTRACTION PHACO AND INTRAOCULAR LENS PLACEMENT (IOC);  Surgeon: Birder Robson, MD;  Location: [facility] ORS;  Service: Ophthalmology;  Laterality: Left;  [place] 01:43 AP% 19.8 CDE 20.45 FLUID PACK LOT  #[id number] H  . KNEE ARTHROSCOPY Left 1992  . LIMBAL STEM CELL TRANSPLANT    . PAIN PUMP IMPLANTATION  2012  . stem cell implant  2008   [facility]    There were no vitals filed for this visit.       Subjective Assessment - 10/18/16 1350    Subjective Pt. states he is currently more sedentary than normal.  Pt. likes to read, build hobby kits, and has recently adopted a dog. Pt has recently progressed from a single point cane to no assistive device and states he feels good without it.    Patient is accompained by: Family member   Pertinent History Pt. has multiple myeloma (dx 2008.) Pt. was unable to walk for a while afterward but did PT for ~6 mo. after. Pt states cancer went into remission, no meds 2008-2011, cancer returned 2011/2012. Hx of 4 compression fx. Took chemo drug for 5 years which was beneficial, not currently taking any chemo. Sept 2017 pt had 2nd stem cell transplant, recently pt has returned to going out in public. Before stem cell transplant, pt reports he could walk a mile, but now only walks to mailbox and back since he has lost his endurance. Pt progressed well with home health PT going up and down stairs. Reports 3/10 low back pain and takes an Aleve in the  a.m.  Pt has a pain pump for spine for daily use. Pt has next f/u with cancer Dr. 10/25/16.    Patient Stated Goals Pt. would like to return to a gym-based program to begin regaining general strength    Currently in Pain? No/denies            [facility] PT Assessment - 10/19/16 0001      Assessment   Medical Diagnosis multiple myeloma    Referring Provider Nolon Stalls, MD   Onset Date/Surgical Date 06/09/16   Next MD Visit 10/25/16     Precautions   Precautions None     Restrictions   Weight Bearing Restrictions No     Balance Screen   Has the patient fallen in the past 6 months No   Has the patient had a decrease in activity level because of a fear of falling?  Yes          Gait training:  Pt. was  instructed and cued for proper upright posture while ambulating and showed a narrow BOS during gait. Pt's endurance was assessed on the SciFit L3 for 10 minutes and showed no major change in vital signs HR and Sp02.          PT Long Term Goals - 10/19/16 1753      PT LONG TERM GOAL #1   Title Pt. will score a 56/56 on the Berg Balance Assessment in order to improve functional mobility and balance.    Baseline Pt scored a 54/56 10/19/15   Time 4   Period Weeks   Status New     PT LONG TERM GOAL #2   Title Pt will demonstrate a normalized gait pattern with a wider base of support in order to improve overall mobility.    Baseline Pt. shows some unease while ambulating due to narrow BOS   Time 4   Period Weeks   Status New     PT LONG TERM GOAL #3   Title Pt will ambulate for 10 minutes safely without fatigue in order to increase endurance while in the community.    Baseline Pt. recently progressed from single point cane and reports some fatigue with prolonged activity.    Time 4   Period Weeks   Status New     PT LONG TERM GOAL #4   Title Pt. will participate in a gym-based exercise program with no limitations to promote independence.    Baseline Pt. would like to return to gym to resume exercising regularly   Time 4   Period Weeks   Status New          Plan - 10/19/16 1813    Clinical Impression Statement Pt. is a pleasant 71 y/o male with a hx of multiple myeloma. Pt. presents to PT with generalized weakness and would like to return to a gym-based exercise program in the near future. Pt. has received prior home-based PT and is currently on a good HEP that he is continuing. Pt's UE and LE AROM: WNL. LE 5/5 except for bilateral hip flex. 4+/5 MMT. UE 5/5 except L shoulder abd. 4+/5. Pt presents with some grade 1 pitting edema in B LE. Grip strength R: 59.8 lbs L: 54.1 lbs. Pt. scored 54/56 on Berg and completed 10 minutes on SciFit L3.  Pt. ambulates with NBOS/ forward and  rounded shoulder posture with gait pattern with no assistive device.  Pt. fatigues easy and benefits from seated rest breaks.  Pt would benefit  from skilled PT to increase muscular endurance and strength in order to ambulate safely in the community/ progress to a gym based ex. program.    Rehab Potential Good   Clinical Impairments Affecting Rehab Potential motivation, active lifestyle, family support.    PT Frequency 2x / week   PT Duration 4 weeks   PT Treatment/Interventions Gait training;Functional mobility training;Therapeutic exercise;Balance training;Neuromuscular re-education   PT Next Visit Plan high level balance and gait activities, strengthening   PT Home Exercise Plan already issued    Consulted and Agree with Plan of Care Patient;Family member/caregiver      Patient will benefit from skilled therapeutic intervention in order to improve the following deficits and impairments:  Abnormal gait, Postural dysfunction, Decreased activity tolerance, Decreased endurance, Decreased strength, Decreased balance, Difficulty walking  Visit Diagnosis: Muscle weakness (generalized)  Difficulty in walking, not elsewhere classified  Imbalance  Abnormal posture      G-Codes - October 30, 2016 1748    Functional Assessment Tool Used Berg Balance Assessment   Functional Limitation Mobility: Walking and moving around   Mobility: Walking and Moving Around Current Status [phone]) At least 20 percent but less than 40 percent impaired, limited or restricted   Mobility: Walking and Moving Around Goal Status [phone]) At least 1 percent but less than 20 percent impaired, limited or restricted       Problem List Patient Active Problem List   Diagnosis Date Noted  . Multiple myeloma in remission ([place]) 09/19/2016  . Xerostomia 08/23/2016  . Moderate protein-calorie malnutrition ([place]) 08/20/2016  . Thrombocytopenia ([place]) 08/20/2016  . Anemia 08/20/2016  . Nausea without vomiting 08/20/2016  . Atrial  fibrillation ([place]) 07/10/2016  . 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 H/O autologous stem cell transplant ([place]) 07/10/2016  . Bone cancer ([place]) 06/15/2016  . Porokeratosis 04/06/2016  . Encounter for adjustment or management of infusion pump 03/23/2016  . Hypomagnesemia 12/01/2015  . Ejection fraction < 50% 11/30/2015  . Chronic pain 08/26/2015  . Presence of intrathecal pump 08/26/2015  . Long term current use of opiate analgesic 08/26/2015  . Long term prescription opiate use 08/26/2015  . Opiate use 08/26/2015  . Encounter for therapeutic drug level monitoring 08/26/2015  . Night muscle spasms 08/26/2015  . Muscle spasticity 08/26/2015  . Neuropathic pain 08/26/2015  . Neurogenic pain 08/26/2015  . Chronic low back pain 08/26/2015  . Lumbar spondylosis 08/26/2015  . Compression fracture of T12 vertebra (HCC) (70-75% magnitude) (with mild retropulsion) 08/26/2015  . Diffuse myofascial pain syndrome 08/26/2015  . Presence of implanted infusion pump (Medtronic, programmable, intrathecal pump) 08/26/2015  . Cancer associated pain 08/26/2015  . Encounter for interrogation of infusion pump 08/26/2015  . Chronic lumbar radicular pain 08/26/2015  . Opiate analgesic contract exists 08/26/2015  . Chronic pain syndrome 08/26/2015  . Chronic anticoagulation (Eliquis) 08/26/2015  . Clostridium difficile diarrhea 07/21/2015  . Diarrhea 07/19/2015  . Multiple myeloma ([place]) 05/04/2015  . BPH with obstruction/lower urinary tract symptoms 04/29/2015  . MI (mitral incompetence) 12/03/2014  . TI (tricuspid incompetence) 12/03/2014  . Paroxysmal atrial fibrillation ([place]) 12/03/2014  . Breathlessness on exertion 12/03/2014  . Essential (primary) hypertension 01/29/2014  . Acid reflux 01/29/2014  . Combined fat and carbohydrate induced hyperlipemia 01/29/2014  . Pulmonary embolism ([place]) 01/29/2014   Pura Spice, PT, DPT # [id number]  SPT 10-30-2016, 6:21 PM  [facility] [address] [place], [place], [address] Phone: [phone]  Fax:  [phone]  Name: Logan Perez MRN: [id number] Date of Birth: 02-20-1946

## 2016-10-23 ENCOUNTER — Ambulatory Visit: Payer: Medicare Other | Admitting: Physical Therapy

## 2016-10-23 ENCOUNTER — Encounter: Payer: Medicare Other | Admitting: Physical Therapy

## 2016-10-23 ENCOUNTER — Encounter: Payer: Self-pay | Admitting: Physical Therapy

## 2016-10-23 DIAGNOSIS — R2689 Other abnormalities of gait and mobility: Secondary | ICD-10-CM

## 2016-10-23 DIAGNOSIS — M6281 Muscle weakness (generalized): Secondary | ICD-10-CM | POA: Diagnosis not present

## 2016-10-23 DIAGNOSIS — R293 Abnormal posture: Secondary | ICD-10-CM

## 2016-10-23 DIAGNOSIS — R262 Difficulty in walking, not elsewhere classified: Secondary | ICD-10-CM

## 2016-10-23 NOTE — Therapy (Signed)
[facility] [address] [place], [place], [address] Phone: [phone]   Fax:  [phone]  Physical Therapy Treatment  Patient Details  Name: Logan Perez MRN: [id number] Date of Birth: 08/14/1946 Referring Provider: Melissa Corcoran, MD  Encounter Date: 10/23/2016      PT End of Session - 10/23/16 1102    Visit Number 2   Number of Visits 8   Date for PT Re-Evaluation 11/15/16   Authorization - Visit Number 2   Authorization - Number of Visits 10   PT Start Time 1058   PT Stop Time 1147   PT Time Calculation (min) 49 min   Equipment Utilized During Treatment Gait belt   Activity Tolerance Patient tolerated treatment well   Behavior During Therapy WFL for tasks assessed/performed      Past Medical History:  Diagnosis Date  . Anxiety   . Atrial fibrillation (H[facility])   . BPH (benign prostatic hyperplasia)   . Complication of anesthesia    BAD HEADACHE NIGHT OF FIRST CATARACT  . Difficulty voiding   . Dysrhythmia    A FIB  . Elevated PSA   . GERD (gastroesophageal reflux disease)   . HLD (hyperlipidemia)   . HOH (hard of hearing)   . Hypertension   . Multiple myeloma (H[facility])   . Neuropathy (H[facility])   . Pain    BACK  . Palpitations   . Pneumonia   . Pulmonary embolism ([facility])   . Stroke ([facility])    TIA    Past Surgical History:  Procedure Laterality Date  . BACK SURGERY  1994  . CATARACT EXTRACTION W/PHACO Right 05/02/2016   Procedure: CATARACT EXTRACTION PHACO AND INTRAOCULAR LENS PLACEMENT (IOC);  Surgeon: William Porfilio, MD;  Location: [facility] ORS;  Service: Ophthalmology;  Laterality: Right;  [place] 1.06 AP% 20.6 CDE 13.70 FLUID PACK LOT # [id number]  . CATARACT EXTRACTION W/PHACO Left 05/16/2016   Procedure: CATARACT EXTRACTION PHACO AND INTRAOCULAR LENS PLACEMENT (IOC);  Surgeon: William Porfilio, MD;  Location: [facility] ORS;  Service: Ophthalmology;  Laterality: Left;  [place] 01:43 AP% 19.8 CDE 20.45 FLUID PACK LOT  #[id number]  . KNEE ARTHROSCOPY Left 1992  . LIMBAL STEM CELL TRANSPLANT    . PAIN PUMP IMPLANTATION  2012  . stem cell implant  2008   [facility]    There were no vitals filed for this visit.      Subjective Assessment - 10/23/16 1100    Subjective Pt. states he had a good weekend.  Pt. walked new dog this weekend with no issues.  Pt. stayed active with changing deadbolts/door knobs.     Patient is accompained by: Family member   Pertinent History Pt. has multiple myeloma (dx 2008.) Pt. was unable to walk for a while afterward but did PT for ~6 mo. after. Pt states cancer went into remission, no meds 2008-2011, cancer returned 2011/2012. Hx of 4 compression fx. Took chemo drug for 5 years which was beneficial, not currently taking any chemo. Sept 2017 pt had 2nd stem cell transplant, recently pt has returned to going out in public. Before stem cell transplant, pt reports he could walk a mile, but now only walks to mailbox and back since he has lost his endurance. Pt progressed well with home health PT going up and down stairs. Reports 3/10 low back pain and takes an Aleve in the a.m.  Pt has a pain pump for spine for daily use. Pt has next f/u with cancer   Dr. 10/25/16.    Patient Stated Goals Pt. would like to return to a gym-based program to begin regaining general strength    Currently in Pain? No/denies       OBJECTIVE:  There.ex.:  Scifit L6 10 min. B UE/LE (no charge/warm-up).  Ambulate for 5 min. 13 sec. In hallway without assistive device working on consistent cadence/ arm swing (maintaining mid-line in hallway)-  Cuing for upright posture/ head position.  Sit to stand 10x with no UE assist.  TG knee flexion/ heel raises/ knee flexion with shoulder flexion 20x each.  Neuro: tandem/ SLS.  Walking in hallway with alt. UE/LE touches/ lateral walking with addition of forward braiding/ tandem gait forward and backwards.  Reviewed HEP.        Pt response for medical necessity:   Benefits from  generalized strength training to improve independence with gait/ balance/ safety on all surfaces.  Moderate overall fatigue but good muscle strengthening noted.          PT Long Term Goals - 10/19/16 1753      PT LONG TERM GOAL #1   Title Pt. will score a 56/56 on the Berg Balance Assessment in order to improve functional mobility and balance.    Baseline Pt scored a 54/56 10/19/15   Time 4   Period Weeks   Status New     PT LONG TERM GOAL #2   Title Pt will demonstrate a normalized gait pattern with a wider base of support in order to improve overall mobility.    Baseline Pt. shows some unease while ambulating due to narrow BOS   Time 4   Period Weeks   Status New     PT LONG TERM GOAL #3   Title Pt will ambulate for 10 minutes safely without fatigue in order to increase endurance while in the community.    Baseline Pt. recently progressed from single point cane and reports some fatigue with prolonged activity.    Time 4   Period Weeks   Status New     PT LONG TERM GOAL #4   Title Pt. will participate in a gym-based exercise program with no limitations to promote independence.    Baseline Pt. would like to return to gym to resume exercising regularly   Time 4   Period Weeks   Status New            Plan - 10/23/16 1243    Clinical Impression Statement Pt. works hard during tx. session with strengthening progression/ balance tasks in //-bars/ hallway with min. to no UE assist.  Minimal pitting edema remains in B lower legs/ ankles.  Moderate B LE muscle fatigue after TG knee flexion/ heel raises and quad focused strengthening.  Pt. will continue to benefit from generalized LE strength training to improve independence/safety with gait.     Rehab Potential Good   Clinical Impairments Affecting Rehab Potential motivation, active lifestyle, family support.    PT Frequency 2x / week   PT Duration 4 weeks   PT Treatment/Interventions Gait training;Functional mobility  training;Therapeutic exercise;Balance training;Neuromuscular re-education   PT Next Visit Plan high level balance and gait activities, strengthening   Consulted and Agree with Plan of Care Patient;Family member/caregiver      Patient will benefit from skilled therapeutic intervention in order to improve the following deficits and impairments:  Abnormal gait, Postural dysfunction, Decreased activity tolerance, Decreased endurance, Decreased strength, Decreased balance, Difficulty walking  Visit Diagnosis: Muscle weakness (generalized)  Difficulty  in walking, not elsewhere classified  Imbalance  Abnormal posture     Problem List Patient Active Problem List   Diagnosis Date Noted  . Multiple myeloma in remission (HCC) 09/19/2016  . Xerostomia 08/23/2016  . Moderate protein-calorie malnutrition (HCC) 08/20/2016  . Thrombocytopenia (HCC) 08/20/2016  . Anemia 08/20/2016  . Nausea without vomiting 08/20/2016  . Atrial fibrillation (HCC) 07/10/2016  . 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 H/O autologous stem cell transplant (HCC) 07/10/2016  . Bone cancer (HCC) 06/15/2016  . Porokeratosis 04/06/2016  . Encounter for adjustment or management of infusion pump 03/23/2016  . Hypomagnesemia 12/01/2015  . Ejection fraction < 50% 11/30/2015  . Chronic pain 08/26/2015  . Presence of intrathecal pump 08/26/2015  . Long term current use of opiate analgesic 08/26/2015  . Long term prescription opiate use 08/26/2015  . Opiate use 08/26/2015  . Encounter for therapeutic drug level monitoring 08/26/2015  . Night muscle spasms 08/26/2015  . Muscle spasticity 08/26/2015  . Neuropathic pain 08/26/2015  . Neurogenic pain 08/26/2015  . Chronic low back pain 08/26/2015  . Lumbar spondylosis 08/26/2015  . Compression fracture of T12 vertebra (HCC) (70-75% magnitude) (with mild retropulsion) 08/26/2015  . Diffuse myofascial pain syndrome 08/26/2015  .  Presence of implanted infusion pump (Medtronic, programmable, intrathecal pump) 08/26/2015  . Cancer associated pain 08/26/2015  . Encounter for interrogation of infusion pump 08/26/2015  . Chronic lumbar radicular pain 08/26/2015  . Opiate analgesic contract exists 08/26/2015  . Chronic pain syndrome 08/26/2015  . Chronic anticoagulation (Eliquis) 08/26/2015  . Clostridium difficile diarrhea 07/21/2015  . Diarrhea 07/19/2015  . Multiple myeloma (HCC) 05/04/2015  . BPH with obstruction/lower urinary tract symptoms 04/29/2015  . MI (mitral incompetence) 12/03/2014  . TI (tricuspid incompetence) 12/03/2014  . Paroxysmal atrial fibrillation (HCC) 12/03/2014  . Breathlessness on exertion 12/03/2014  . Essential (primary) hypertension 01/29/2014  . Acid reflux 01/29/2014  . Combined fat and carbohydrate induced hyperlipemia 01/29/2014  . Pulmonary embolism ([facility]) 01/29/2014   Michael C Sherk, PT, DPT # [id number] 10/24/2016, 8:40 AM  [facility] [address] [place], [place], [address] Phone: [phone]   Fax:  [phone]  Name: Logan Perez MRN: [id number] Date of Birth: 06/21/1946    

## 2016-10-24 ENCOUNTER — Inpatient Hospital Stay: Payer: Medicare Other

## 2016-10-24 DIAGNOSIS — C9001 Multiple myeloma in remission: Secondary | ICD-10-CM

## 2016-10-24 DIAGNOSIS — C9002 Multiple myeloma in relapse: Secondary | ICD-10-CM | POA: Diagnosis not present

## 2016-10-24 LAB — CBC WITH DIFFERENTIAL/PLATELET
Basophils Absolute: 0 10*3/uL (ref 0–0.1)
Basophils Relative: 0 %
Eosinophils Absolute: 0.1 10*3/uL (ref 0–0.7)
Eosinophils Relative: 1 %
HCT: 28.7 % — ABNORMAL LOW (ref 40.0–52.0)
Hemoglobin: 9.7 g/dL — ABNORMAL LOW (ref 13.0–18.0)
Lymphocytes Relative: 15 %
Lymphs Abs: 1.2 10*3/uL (ref 1.0–3.6)
MCH: 38.2 pg — ABNORMAL HIGH (ref 26.0–34.0)
MCHC: 33.8 g/dL (ref 32.0–36.0)
MCV: 112.9 fL — ABNORMAL HIGH (ref 80.0–100.0)
Monocytes Absolute: 0.9 10*3/uL (ref 0.2–1.0)
Monocytes Relative: 10 %
Neutro Abs: 6.1 10*3/uL (ref 1.4–6.5)
Neutrophils Relative %: 74 %
Platelets: 144 10*3/uL — ABNORMAL LOW (ref 150–440)
RBC: 2.55 MIL/uL — ABNORMAL LOW (ref 4.40–5.90)
RDW: 14.7 % — ABNORMAL HIGH (ref 11.5–14.5)
WBC: 8.3 10*3/uL (ref 3.8–10.6)

## 2016-10-24 LAB — MAGNESIUM: Magnesium: 1.6 mg/dL — ABNORMAL LOW (ref 1.7–2.4)

## 2016-10-25 ENCOUNTER — Ambulatory Visit: Payer: Medicare Other

## 2016-10-25 ENCOUNTER — Ambulatory Visit: Payer: Medicare Other | Admitting: Hematology and Oncology

## 2016-10-25 ENCOUNTER — Other Ambulatory Visit: Payer: Self-pay | Admitting: Hematology and Oncology

## 2016-10-25 ENCOUNTER — Other Ambulatory Visit: Payer: Medicare Other

## 2016-10-25 LAB — PROTEIN ELECTROPHORESIS, SERUM
A/G Ratio: 1.5 (ref 0.7–1.7)
Albumin ELP: 3.5 g/dL (ref 2.9–4.4)
Alpha-1-Globulin: 0.2 g/dL (ref 0.0–0.4)
Alpha-2-Globulin: 0.7 g/dL (ref 0.4–1.0)
Beta Globulin: 0.9 g/dL (ref 0.7–1.3)
Gamma Globulin: 0.5 g/dL (ref 0.4–1.8)
Globulin, Total: 2.3 g/dL (ref 2.2–3.9)
PDF: 0
Total Protein ELP: 5.8 g/dL — ABNORMAL LOW (ref 6.0–8.5)

## 2016-10-26 ENCOUNTER — Encounter: Payer: Medicare Other | Admitting: Physical Therapy

## 2016-10-26 ENCOUNTER — Inpatient Hospital Stay: Payer: Medicare Other | Admitting: Hematology and Oncology

## 2016-10-26 ENCOUNTER — Inpatient Hospital Stay: Payer: Medicare Other

## 2016-10-27 ENCOUNTER — Other Ambulatory Visit: Payer: Self-pay | Admitting: Hematology and Oncology

## 2016-10-27 ENCOUNTER — Ambulatory Visit: Payer: Medicare Other

## 2016-10-27 ENCOUNTER — Telehealth: Payer: Self-pay | Admitting: *Deleted

## 2016-10-27 ENCOUNTER — Inpatient Hospital Stay: Payer: Medicare Other

## 2016-10-27 DIAGNOSIS — C9002 Multiple myeloma in relapse: Secondary | ICD-10-CM | POA: Diagnosis not present

## 2016-10-27 MED ORDER — MAGNESIUM SULFATE 2 GM/50ML IV SOLN
2.0000 g | Freq: Once | INTRAVENOUS | Status: AC
  Administered 2016-10-27: 2 g via INTRAVENOUS
  Filled 2016-10-27: qty 50

## 2016-10-27 MED ORDER — SODIUM CHLORIDE 0.9 % IV SOLN
INTRAVENOUS | Status: DC
  Administered 2016-10-27: 15:00:00 via INTRAVENOUS
  Filled 2016-10-27: qty 1000

## 2016-10-27 NOTE — Telephone Encounter (Signed)
Called and spoke with pt's wife and patient and informed them of low magnesium, voiced understanding, pt ok to come today for mag infusion. Patient informed to be here as soon as possible to receive magnesium, voiced understanding.

## 2016-10-27 NOTE — Progress Notes (Signed)
Order for [place] today for 2gram, but special instructions states to give 3gm.  Verified order with MD.  To give 2gm of magnesium today.

## 2016-10-30 ENCOUNTER — Other Ambulatory Visit: Payer: Self-pay | Admitting: *Deleted

## 2016-10-30 ENCOUNTER — Ambulatory Visit: Payer: Medicare Other | Admitting: Physical Therapy

## 2016-10-30 ENCOUNTER — Encounter: Payer: Self-pay | Admitting: Physical Therapy

## 2016-10-30 DIAGNOSIS — R293 Abnormal posture: Secondary | ICD-10-CM

## 2016-10-30 DIAGNOSIS — M6281 Muscle weakness (generalized): Secondary | ICD-10-CM | POA: Diagnosis not present

## 2016-10-30 DIAGNOSIS — R2689 Other abnormalities of gait and mobility: Secondary | ICD-10-CM

## 2016-10-30 DIAGNOSIS — C9000 Multiple myeloma not having achieved remission: Secondary | ICD-10-CM

## 2016-10-30 DIAGNOSIS — R262 Difficulty in walking, not elsewhere classified: Secondary | ICD-10-CM

## 2016-10-30 MED ORDER — APIXABAN 5 MG PO TABS: ORAL_TABLET | ORAL | 0 refills | Status: DC

## 2016-10-30 NOTE — Therapy (Signed)
[facility] [address] [place], [place], [address] Phone: [phone]   Fax:  [phone]  Physical Therapy Treatment  Patient Details  Name: Logan Perez MRN: [id number] Date of Birth: 07/26/1946 Referring Provider: Nolon Stalls, MD  Encounter Date: 10/30/2016      PT End of Session - 10/30/16 1222    Visit Number 3   Number of Visits 8   Date for PT Re-Evaluation 11/15/16   Authorization - Visit Number 3   Authorization - Number of Visits 10   PT Start Time [phone]   PT Stop Time 1045   PT Time Calculation (min) 63 min   Activity Tolerance Patient tolerated treatment well   Behavior During Therapy [facility] for tasks assessed/performed      Past Medical History:  Diagnosis Date  . Anxiety   . Atrial fibrillation ([place])   . BPH (benign prostatic hyperplasia)   . Complication of anesthesia    BAD HEADACHE NIGHT OF FIRST CATARACT  . Difficulty voiding   . Dysrhythmia    A FIB  . Elevated PSA   . GERD (gastroesophageal reflux disease)   . HLD (hyperlipidemia)   . HOH (hard of hearing)   . Hypertension   . Multiple myeloma ([place])   . Neuropathy ([place])   . Pain    BACK  . Palpitations   . Pneumonia   . Pulmonary embolism ([place])   . Stroke [facility])    TIA    Past Surgical History:  Procedure Laterality Date  . BACK SURGERY  1994  . CATARACT EXTRACTION W/PHACO Right 05/02/2016   Procedure: CATARACT EXTRACTION PHACO AND INTRAOCULAR LENS PLACEMENT (IOC);  Surgeon: Birder Robson, MD;  Location: [facility] ORS;  Service: Ophthalmology;  Laterality: Right;  [place] 1.06 AP% 20.6 CDE 13.70 FLUID PACK LOT # [id number] H  . CATARACT EXTRACTION W/PHACO Left 05/16/2016   Procedure: CATARACT EXTRACTION PHACO AND INTRAOCULAR LENS PLACEMENT (IOC);  Surgeon: Birder Robson, MD;  Location: [facility] ORS;  Service: Ophthalmology;  Laterality: Left;  [place] 01:43 AP% 19.8 CDE 20.45 FLUID PACK LOT #[id number] H  . KNEE ARTHROSCOPY Left 1992  . LIMBAL  STEM CELL TRANSPLANT    . PAIN PUMP IMPLANTATION  2012  . stem cell implant  2008   [facility]    There were no vitals filed for this visit.      Subjective Assessment - 10/30/16 1220    Subjective Pt. states he was able to get outside this weekend with his dog and has been enjoying walking him. Pt. states he has been compliant with HEP.   Pertinent History Pt. has multiple myeloma (dx 2008.) Pt. was unable to walk for a while afterward but did PT for ~6 mo. after. Pt states cancer went into remission, no meds 2008-2011, cancer returned 2011/2012. Hx of 4 compression fx. Took chemo drug for 5 years which was beneficial, not currently taking any chemo. Sept 2017 pt had 2nd stem cell transplant, recently pt has returned to going out in public. Before stem cell transplant, pt reports he could walk a mile, but now only walks to mailbox and back since he has lost his endurance. Pt progressed well with home health PT going up and down stairs. Reports 3/10 low back pain and takes an Aleve in the a.m.  Pt has a pain pump for spine for daily use.    Patient Stated Goals Pt. would like to return to a gym-based program to begin regaining general strength  Currently in Pain? No/denies       There.ex.: Walking in hallway (5 min. warm-up) - decreased R arm swing/rounded shoulders cued for posture - no fatigue  TG squats 15x2, heel raises 15x2 - good form/straight knees during heel raises Lateral walks in //-bars 8x (no UE assist/no loss of balance) Sit to stands 15x1, 10x1 - cuing for upright posture/head up   SciFit L6 B UE/LE 10 min. (no charge)  Neuro:  SLS in //-bars - left balance <10 sec, R balance 15 sec. No UE assist (mirror feedback) Tandem walking in //-bars 8x no UE assist - cued for slowing speed Step ups with hip flexion holds onto 6" step 20x2 - B UE assist B LE Star cone taps - Pt. Cued for standing LE knee to bend as other LE reaches toward cone to tap/SPT provided CGA for LOB.   Pt.  Continues to benefit from a LE strengthening program as he still reports some LE weakness during ADL's at home. Pt. Continuing to work on endurance at home walking his dog and is progressing through high-level balance and LE strengthening.         PT Long Term Goals - 10/19/16 1753      PT LONG TERM GOAL #1   Title Pt. will score a 56/56 on the Berg Balance Assessment in order to improve functional mobility and balance.    Baseline Pt scored a 54/56 10/19/15   Time 4   Period Weeks   Status New     PT LONG TERM GOAL #2   Title Pt will demonstrate a normalized gait pattern with a wider base of support in order to improve overall mobility.    Baseline Pt. shows some unease while ambulating due to narrow BOS   Time 4   Period Weeks   Status New     PT LONG TERM GOAL #3   Title Pt will ambulate for 10 minutes safely without fatigue in order to increase endurance while in the community.    Baseline Pt. recently progressed from single point cane and reports some fatigue with prolonged activity.    Time 4   Period Weeks   Status New     PT LONG TERM GOAL #4   Title Pt. will participate in a gym-based exercise program with no limitations to promote independence.    Baseline Pt. would like to return to gym to resume exercising regularly   Time 4   Period Weeks   Status New             Plan - 10/30/16 1223    Clinical Impression Statement Pt. showed no signs of fatigue during hallway walking at start of tx session. Pt. showed a decreased R arm swing during gait and cued to maintain upright posture with shoulders down and back. Pt. able to hold SLS on R for 15 sec. with good ankle/knee stability, L SLS <10 sec with some LOB. Pt able to complete 15 sit-to-stands w/o fatigue and cuing to maintain upright posture/head, followed by 10 more (stopped due to B LE muscle fatigue). Pt. cued to maintain slow/controlled LE movements throughout tx session.  Pt. will continue to benefit from  generalized LE strength training to improve independence/safety with gait.    Rehab Potential Good   Clinical Impairments Affecting Rehab Potential motivation, active lifestyle, family support.    PT Frequency 2x / week   PT Duration 4 weeks   PT Treatment/Interventions Gait training;Functional mobility training;Therapeutic exercise;Balance training;Neuromuscular  re-education   PT Next Visit Plan continue high level balance and gait activities, strengthening of quads   PT Home Exercise Plan already issued    Consulted and Agree with Plan of Care Patient;Family member/caregiver      Patient will benefit from skilled therapeutic intervention in order to improve the following deficits and impairments:  Abnormal gait, Postural dysfunction, Decreased activity tolerance, Decreased endurance, Decreased strength, Decreased balance, Difficulty walking  Visit Diagnosis: Muscle weakness (generalized)  Difficulty in walking, not elsewhere classified  Imbalance  Abnormal posture     Problem List Patient Active Problem List   Diagnosis Date Noted  . Multiple myeloma in remission ([place]) 09/19/2016  . Xerostomia 08/23/2016  . Moderate protein-calorie malnutrition ([place]) 08/20/2016  . Thrombocytopenia ([place]) 08/20/2016  . Anemia 08/20/2016  . Nausea without vomiting 08/20/2016  . Atrial fibrillation ([place]) 07/10/2016  . 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 H/O autologous stem cell transplant ([place]) 07/10/2016  . Bone cancer ([place]) 06/15/2016  . Porokeratosis 04/06/2016  . Encounter for adjustment or management of infusion pump 03/23/2016  . Hypomagnesemia 12/01/2015  . Ejection fraction < 50% 11/30/2015  . Chronic pain 08/26/2015  . Presence of intrathecal pump 08/26/2015  . Long term current use of opiate analgesic 08/26/2015  . Long term prescription opiate use 08/26/2015  . Opiate use 08/26/2015  . Encounter for therapeutic drug level monitoring  08/26/2015  . Night muscle spasms 08/26/2015  . Muscle spasticity 08/26/2015  . Neuropathic pain 08/26/2015  . Neurogenic pain 08/26/2015  . Chronic low back pain 08/26/2015  . Lumbar spondylosis 08/26/2015  . Compression fracture of T12 vertebra (HCC) (70-75% magnitude) (with mild retropulsion) 08/26/2015  . Diffuse myofascial pain syndrome 08/26/2015  . Presence of implanted infusion pump ([employer], programmable, intrathecal pump) 08/26/2015  . Cancer associated pain 08/26/2015  . Encounter for interrogation of infusion pump 08/26/2015  . Chronic lumbar radicular pain 08/26/2015  . Opiate analgesic contract exists 08/26/2015  . Chronic pain syndrome 08/26/2015  . Chronic anticoagulation (Eliquis) 08/26/2015  . Clostridium difficile diarrhea 07/21/2015  . Diarrhea 07/19/2015  . Multiple myeloma ([place]) 05/04/2015  . BPH with obstruction/lower urinary tract symptoms 04/29/2015  . MI (mitral incompetence) 12/03/2014  . TI (tricuspid incompetence) 12/03/2014  . Paroxysmal atrial fibrillation ([place]) 12/03/2014  . Breathlessness on exertion 12/03/2014  . Essential (primary) hypertension 01/29/2014  . Acid reflux 01/29/2014  . Combined fat and carbohydrate induced hyperlipemia 01/29/2014  . Pulmonary embolism ([place]) 01/29/2014   Pura Spice, PT, DPT # [id number], SPT 10/31/2016, 8:43 AM  [facility] [address] [place], [place], [address] Phone: [phone]   Fax:  [phone]  Name: Candace Ramus MRN: [id number] Date of Birth: [birth year]

## 2016-10-30 NOTE — Telephone Encounter (Signed)
Prescription resubmitted to pharmacy for refill

## 2016-10-31 ENCOUNTER — Inpatient Hospital Stay (HOSPITAL_BASED_OUTPATIENT_CLINIC_OR_DEPARTMENT_OTHER): Payer: Medicare Other | Admitting: Hematology and Oncology

## 2016-10-31 ENCOUNTER — Encounter: Payer: Self-pay | Admitting: Hematology and Oncology

## 2016-10-31 ENCOUNTER — Inpatient Hospital Stay: Payer: Medicare Other

## 2016-10-31 VITALS — BP 130/79 | HR 81 | Temp 94.1°F | Resp 18 | Wt 147.9 lb

## 2016-10-31 DIAGNOSIS — I10 Essential (primary) hypertension: Secondary | ICD-10-CM | POA: Diagnosis not present

## 2016-10-31 DIAGNOSIS — N400 Enlarged prostate without lower urinary tract symptoms: Secondary | ICD-10-CM

## 2016-10-31 DIAGNOSIS — D696 Thrombocytopenia, unspecified: Secondary | ICD-10-CM | POA: Diagnosis not present

## 2016-10-31 DIAGNOSIS — E785 Hyperlipidemia, unspecified: Secondary | ICD-10-CM

## 2016-10-31 DIAGNOSIS — K219 Gastro-esophageal reflux disease without esophagitis: Secondary | ICD-10-CM | POA: Diagnosis not present

## 2016-10-31 DIAGNOSIS — K117 Disturbances of salivary secretion: Secondary | ICD-10-CM | POA: Diagnosis not present

## 2016-10-31 DIAGNOSIS — Z9484 Stem cells transplant status: Secondary | ICD-10-CM

## 2016-10-31 DIAGNOSIS — G8929 Other chronic pain: Secondary | ICD-10-CM | POA: Diagnosis not present

## 2016-10-31 DIAGNOSIS — I4891 Unspecified atrial fibrillation: Secondary | ICD-10-CM | POA: Diagnosis not present

## 2016-10-31 DIAGNOSIS — C9000 Multiple myeloma not having achieved remission: Secondary | ICD-10-CM

## 2016-10-31 DIAGNOSIS — C9002 Multiple myeloma in relapse: Secondary | ICD-10-CM

## 2016-10-31 DIAGNOSIS — Z79899 Other long term (current) drug therapy: Secondary | ICD-10-CM

## 2016-10-31 DIAGNOSIS — Z7901 Long term (current) use of anticoagulants: Secondary | ICD-10-CM

## 2016-10-31 DIAGNOSIS — M549 Dorsalgia, unspecified: Secondary | ICD-10-CM

## 2016-10-31 DIAGNOSIS — D638 Anemia in other chronic diseases classified elsewhere: Secondary | ICD-10-CM | POA: Diagnosis not present

## 2016-10-31 DIAGNOSIS — Z8673 Personal history of transient ischemic attack (TIA), and cerebral infarction without residual deficits: Secondary | ICD-10-CM

## 2016-10-31 LAB — COMPREHENSIVE METABOLIC PANEL
ALT: 18 U/L (ref 17–63)
AST: 23 U/L (ref 15–41)
Albumin: 3.9 g/dL (ref 3.5–5.0)
Alkaline Phosphatase: 50 U/L (ref 38–126)
Anion gap: 3 — ABNORMAL LOW (ref 5–15)
BUN: 15 mg/dL (ref 6–20)
CO2: 26 mmol/L (ref 22–32)
Calcium: 9.3 mg/dL (ref 8.9–10.3)
Chloride: 109 mmol/L (ref 101–111)
Creatinine, Ser: 1.15 mg/dL (ref 0.61–1.24)
GFR calc Af Amer: >60 mL/min (ref >60)
GFR calc non Af Amer: >60 mL/min (ref >60)
Glucose, Bld: 97 mg/dL (ref 65–99)
Potassium: 4.1 mmol/L (ref 3.5–5.1)
Sodium: 138 mmol/L (ref 135–145)
Total Bilirubin: 0.6 mg/dL (ref 0.3–1.2)
Total Protein: 6.3 g/dL — ABNORMAL LOW (ref 6.5–8.1)

## 2016-10-31 LAB — CBC WITH DIFFERENTIAL/PLATELET
Basophils Absolute: 0 10*3/uL (ref 0–0.1)
Basophils Relative: 1 %
Eosinophils Absolute: 0.3 10*3/uL (ref 0–0.7)
Eosinophils Relative: 5 %
HCT: 30.6 % — ABNORMAL LOW (ref 40.0–52.0)
Hemoglobin: 10.5 g/dL — ABNORMAL LOW (ref 13.0–18.0)
Lymphocytes Relative: 22 %
Lymphs Abs: 1.3 10*3/uL (ref 1.0–3.6)
MCH: 38.3 pg — ABNORMAL HIGH (ref 26.0–34.0)
MCHC: 34.5 g/dL (ref 32.0–36.0)
MCV: 111.1 fL — ABNORMAL HIGH (ref 80.0–100.0)
Monocytes Absolute: 0.8 10*3/uL (ref 0.2–1.0)
Monocytes Relative: 14 %
Neutro Abs: 3.3 10*3/uL (ref 1.4–6.5)
Neutrophils Relative %: 58 %
Platelets: 138 10*3/uL — ABNORMAL LOW (ref 150–440)
RBC: 2.75 MIL/uL — ABNORMAL LOW (ref 4.40–5.90)
RDW: 13.9 % (ref 11.5–14.5)
WBC: 5.7 10*3/uL (ref 3.8–10.6)

## 2016-10-31 LAB — MAGNESIUM: Magnesium: 1.8 mg/dL (ref 1.7–2.4)

## 2016-10-31 NOTE — Progress Notes (Signed)
[facility] day:  10/31/16  Chief Complaint: Logan Perez is a 71 y.o. 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  HPI: The patient was last seen in the medical oncology clinic on 10/11/2016.  At that time, he was seen for assessment following his day 100 evaluation at [facility].  Recommendations were for maintenance Velcade or carfilzomib (Kyprolis).  He has received 2- 4 gm of IV magnesium weekly (last 10/27/2016).  During the interim, he is feeling "decent". He is going to physical therapy. He feels a little sore after physical therapy. He is eating okay. He is trying to eat "3 squares a day". He has to chew his food a lot and drink water.  He has some diarrhea. Imodium is helping.  He is driving.   Past Medical History:  Diagnosis Date  . Anxiety   . Atrial fibrillation ([place])   . BPH (benign prostatic hyperplasia)   . Complication of anesthesia    BAD HEADACHE NIGHT OF FIRST CATARACT  . Difficulty voiding   . Dysrhythmia    A FIB  . Elevated PSA   . GERD (gastroesophageal reflux disease)   . HLD (hyperlipidemia)   . HOH (hard of hearing)   . Hypertension   . Multiple myeloma ([place])   . Neuropathy ([place])   . Pain    BACK  . Palpitations   . Pneumonia   . Pulmonary embolism ([place])   . Stroke [facility])    TIA    Past Surgical History:  Procedure Laterality Date  . BACK SURGERY  1994  . CATARACT EXTRACTION W/PHACO Right 05/02/2016   Procedure: CATARACT EXTRACTION PHACO AND INTRAOCULAR LENS PLACEMENT (IOC);  Surgeon: Birder Robson, MD;  Location: [facility] ORS;  Service: Ophthalmology;  Laterality: Right;  [place] 1.06 AP% 20.6 CDE 13.70 FLUID PACK LOT # [id number]  . CATARACT EXTRACTION W/PHACO Left 05/16/2016   Procedure: CATARACT EXTRACTION PHACO AND INTRAOCULAR LENS PLACEMENT (IOC);  Surgeon: Birder Robson, MD;   Location: [facility] ORS;  Service: Ophthalmology;  Laterality: Left;  [place] 01:43 AP% 19.8 CDE 20.45 FLUID PACK LOT #[id number] H  . KNEE ARTHROSCOPY Left 1992  . LIMBAL STEM CELL TRANSPLANT    . PAIN PUMP IMPLANTATION  2012  . stem cell implant  2008   [facility]    Family History  Problem Relation Age of Onset  . Cancer Father     throat  . Kidney disease Sister   . Stroke    . Bladder Cancer Neg Hx   . Prostate cancer Neg Hx     Social History:  reports that he has quit smoking. His smoking use included Cigarettes. He has a 30.00 pack-year smoking history. He has quit using smokeless tobacco. He reports that he does not drink alcohol. His drug history is not on file.  He lives in [place].  His wife's name is [name].  The patient is alone today.  Allergies:  Allergies  Allergen Reactions  . Azithromycin Diarrhea    Possible cause of C. Diff Possible cause of C. Diff Possible cause of C. Diff Possible cause of C. Diff Possible cause of C. Diff  . 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 Rivaroxaban Rash  Current Medications: Current Outpatient Prescriptions  Medication Sig Dispense Refill  . acetaminophen (TYLENOL) 500 MG tablet Take 1,000 mg by mouth.    . ALPRAZolam (XANAX) 0.25 MG tablet TAKE 1 TABLET BY MOUTH EVERY NIGHT AT BEDTIME AS NEEDED FOR ANXIETY. 30 tablet 3  . apixaban (ELIQUIS) 5 MG TABS tablet TAKE 1 TABLET(5 MG) BY MOUTH TWICE DAILY 60 tablet 0  . baclofen (LIORESAL) 10 MG tablet Take 1 tablet (10 mg total) by mouth at bedtime. 30 each 3  . calcium citrate-vitamin D (CITRACAL+D) 315-200 MG-UNIT per tablet Take 1 tablet by mouth daily.     . cetirizine (ZYRTEC) 10 MG tablet Take 10 mg by mouth daily.     . Cholecalciferol (VITAMIN D3) 2000 UNITS capsule Take 2,000 Units by mouth daily.     .  Cyanocobalamin (RA VITAMIN B-12 TR) 1000 MCG TBCR Take 1,000 mcg by mouth daily.     [name] diltiazem (CARDIZEM) 60 MG tablet Take 60 mg by mouth as needed.    . diltiazem (DILACOR XR) 120 MG 24 hr capsule Take 120 mg by mouth daily.    . fluticasone (FLONASE) 50 MCG/ACT nasal spray Place 1 spray into the nose daily as needed for allergies.     . Iron-Vitamins (GERITOL PO) Take by mouth.    . loperamide (IMODIUM) 2 MG capsule Take 2 mg by mouth as needed for diarrhea or loose stools. Reported on 04/26/2016    . lovastatin (MEVACOR) 20 MG tablet     . magnesium oxide (MAG-OX) 400 (241.3 Mg) MG tablet Take 1 tablet (400 mg total) by mouth daily. 30 tablet 0  . montelukast (SINGULAIR) 10 MG tablet TAKE 1 TABLET BY MOUTH ONCE A DAY AS DIRECTED. 30 tablet 0  . omeprazole (PRILOSEC) 20 MG capsule Take 20 mg by mouth daily.     . potassium chloride SA (K-DUR,KLOR-CON) 20 MEQ tablet TAKE 1 TABLET(20 MEQ) BY MOUTH TWICE DAILY 180 tablet 0  . prochlorperazine (COMPAZINE) 10 MG tablet Take 10 mg by mouth every 6 (six) hours as needed for nausea or vomiting.    . ranitidine (ZANTAC) 150 MG tablet Take 150 mg by mouth daily.     . tamsulosin (FLOMAX) 0.4 MG CAPS capsule Take 1 capsule (0.4 mg total) by mouth daily. 90 capsule 4  . valACYclovir (VALTREX) 500 MG tablet TAKE 1 TABLET BY MOUTH DAILY 30 tablet 0  . PAIN MANAGEMENT IT PUMP REFILL 1 each by Intrathecal route once. Medication: PF Fentanyl 1,500.0 mcg/ml PF Bupivicaine 30.0 mg/ml PF Clonidine 300.34mg/ml Total Volume: 40 ml Needed by 09-19-16 _0  1 each 0   No current facility-administered medications for this visit.     Review of Systems:  GENERAL: Feels "pretty decent".  No fevers or sweats.  Weight down 5 pounds. PERFORMANCE STATUS (ECOG): 1 HEENT:  Cataracts s/p surgery.  Dry mouth.  Taste poor.  No sore throat, mouth sores or tenderness. Lungs:  No shortness of breath or cough.  No hemoptysis. Cardiac:  No chest pain, palpitations,  orthopnea, or PND. GI:  Appetite good.  Nausea in afternoon managed with Compazine.  Little diarrhea managed with Imodium.  No vomiting, constipation, melena or hematochezia. GU:  No urgency, frequency, dysuria, or hematuria. Musculoskeletal:  Compression fracture of back on a pain pump. No muscle tenderness. Extremities:  No pain or swelling. Skin:  Fragile skin.  No unexplained bruises.  No rashes or skin changes. Neuro:  Little tremor (chronic).  Neuropathy in feet.  No headache,  weakness, balance or coordination issues. Endocrine:  No diabetes, thyroid issues, hot flashes or night sweats. Psych:  No mood changes, depression or anxiety. Pain:  Chronic back pain (pain well managed with pump). Review of systems:  All other systems reviewed and found to be negative.   Physical Exam: Blood pressure 130/79, pulse 81, temperature (!) 94.1 F (34.5 C), temperature source Tympanic, resp. rate 18, weight 147 lb 14.9 oz (67.1 kg). GENERAL:  Thin elderly gentleman sitting comfortably in the exam room in no acute distress.  MENTAL STATUS:  Alert and oriented to person, place and time. HEAD:  Wearing a cap.  Thin gray hair.  Temporal wasting.  Normocephalic, atraumatic, face symmetric, no Cushingoid features. EYES:  Glasses.  Blue eyes s/p cataract surgery.  Pupils equal round and reactive to light and accomodation.  No conjunctivitis or scleral icterus. ENT:  Hearing aide.  Oropharynx clear without lesion.  Hearing aide.  Tongue normal. Mucous membranes moist.  RESPIRATORY:  Clear to auscultation without rales, wheezes or rhonchi. CARDIOVASCULAR:  Regular rate and rhythm without murmur, rub or gallop. ABDOMEN:  RUQ pain pump.  Soft, non-tender, with active bowel sounds, and no hepatosplenomegaly.  No masses. SKIN:  No rashes, ulcers or lesions. EXTREMITIES: Ankle edema.  No skin discoloration or tenderness.  No palpable cords. LYMPH NODES: No palpable cervical, supraclavicular, axillary or inguinal  adenopathy  NEUROLOGICAL: Intention tremor. PSYCH:  Appropriate.    No visits with results within 3 Day(s) from this visit.  Latest known visit with results is:  Appointment on 10/24/2016  Component Date Value Ref Range Status  . WBC 10/24/2016 8.3  3.8 - 10.6 K/uL Final  . RBC 10/24/2016 2.55* 4.40 - 5.90 MIL/uL Final  . Hemoglobin 10/24/2016 9.7* 13.0 - 18.0 g/dL Final  . HCT 10/24/2016 28.7* 40.0 - 52.0 % Final  . MCV 10/24/2016 112.9* 80.0 - 100.0 fL Final  . MCH 10/24/2016 38.2* 26.0 - 34.0 pg Final  . MCHC 10/24/2016 33.8  32.0 - 36.0 g/dL Final  . RDW 10/24/2016 14.7* 11.5 - 14.5 % Final  . Platelets 10/24/2016 144* 150 - 440 K/uL Final  . Neutrophils Relative % 10/24/2016 74  % Final  . Neutro Abs 10/24/2016 6.1  1.4 - 6.5 K/uL Final  . Lymphocytes Relative 10/24/2016 15  % Final  . Lymphs Abs 10/24/2016 1.2  1.0 - 3.6 K/uL Final  . Monocytes Relative 10/24/2016 10  % Final  . Monocytes Absolute 10/24/2016 0.9  0.2 - 1.0 K/uL Final  . Eosinophils Relative 10/24/2016 1  % Final  . Eosinophils Absolute 10/24/2016 0.1  0 - 0.7 K/uL Final  . Basophils Relative 10/24/2016 0  % Final  . Basophils Absolute 10/24/2016 0.0  0 - 0.1 K/uL Final  . Magnesium 10/24/2016 1.6* 1.7 - 2.4 mg/dL Final  . Total Protein ELP 10/24/2016 5.8* 6.0 - 8.5 g/dL Final  . Albumin ELP 10/24/2016 3.5  2.9 - 4.4 g/dL Final  . Alpha-1-Globulin 10/24/2016 0.2  0.0 - 0.4 g/dL Final  . Alpha-2-Globulin 10/24/2016 0.7  0.4 - 1.0 g/dL Final  . Beta Globulin 10/24/2016 0.9  0.7 - 1.3 g/dL Final  . Gamma Globulin 10/24/2016 0.5  0.4 - 1.8 g/dL Final  . M-Spike, % 10/24/2016 Not Observed  Not Observed g/dL Final  . SPE Interp. 10/24/2016 Comment   Final   Comment: (NOTE) The SPE pattern appears essentially unremarkable. Evidence of monoclonal protein is not apparent.   . Comment 10/24/2016 Comment   Final  Comment: (NOTE) Protein electrophoresis scan will follow via computer, mail, or courier delivery.    [name] GLOBULIN, TOTAL 10/24/2016 2.3  2.2 - 3.9 g/dL Corrected  . A/G Ratio 10/24/2016 1.5  0.7 - 1.7 Corrected  . PDF 10/24/2016 .   Corrected   Comment: (NOTE) Performed At: [facility] [address] [place], [place] [id number] Lindon Romp MD JS:[id number]     Assessment:  Logan Perez is a 71 y.o. male with stage III mutiple myeloma.  He initially presented with progressive back pain beginning in 12/2006.  MRI revealed "spots and compression fractures".  He began Velcade, thalidomide, and Decadron.  In 08/2007, he underwent high dose chemotherapy and autologous stem cell transplant.  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  He recurred with a rising M-spike (2.7) with repeat M spike (1.7 gm/dl) in 03/2010.  He was initially treated with Velcade (02/08/2010 - 05/10/2010).  He then began Revlimid (15 mg 3 weeks on/1 week off) and Decadron (40 mg on day 1, 8, 15, 22).  Because of significant side effect with Decadron his dose was decreased to 10 mg once a week in 07/2010.    He was on maintenance Revlimid. Revlimid was initially10 mg 3 weeks on/1 week off. This was changed to 10 mg 2 weeks on/2 weeks off secondary to right nipple tenderness. His dose was increased to 10 mg 3 weeks on/1 week off with Decadron 10 mg a week (on Sundays) and then Revlamid 15 mg 3 weeks on and 1 week off with Decadron on Sundays.  He began Pomalyst 4 mg 3 weeks on/1 week off with Decadron on 08/27/2015.  Over the past year his SPEP has revealed no monoclonal protein (04/21/2015) and 0.5 gm/dL on 09/22/2015 and 10/20/2015. M spike was 0.1 on 02/02/2016, 03/01/2016, 03/29/2016, 04/26/2016, and 05/24/2016.   M-spike was 0 on 10/11/2016.  Free light chains have been monitored. Kappa free light chains were 18.54 on 11/21/2013, 18.37 on 02/20/2014, 18.93 on 05/22/2014, 32.58 (high; normal ratio 1.27) on 08/21/2014, 51.53 (high; elevated ratio of 2.12) on 11/13/2014, 28.08  (ratio 1.73) on 12/09/2014, 23.71 (ratio 2.17) on 01/18/2015, 92.93 (ratio 9.49) on 04/21/2015, 93.44 (ratio 10.28) on 05/26/2015, 255.45 (ratio of 24.05) on 07/14/2015, 373.89 (ratio 48.31) on 08/04/2015, 474.33 (ratio 70.58) on 08/25/2015, 450.76 (ratio 55.44) on 09/22/2015, 453.4 (ratio 59.89) on 10/20/2015, 58.26 (ratio of > 40.74) on 02/02/2016, 62.67 (ratio of 37.98) on 03/01/2016, 75.7 (ratio > 50.47) on 03/29/2016, 79.7 (ratio 53.13) on 04/26/2016, 108.6 (ratio > 72.4) on 05/24/2016, and 8.3 (ratio 1.46) on 10/11/2016.  Bone survey on 12/08/2014 was stable.  Bone survey on 10/21/2015 revealed increase conspicuity of subcentimeter lytic lesions in the calvarium.  Bone marrow aspirate and biopsy on 11/04/2015 revealed an atypical monoclonal plasma cells estimated at 30-40% of marrow cells.   Marrow was variably cellular (approximately 45%) with background trilineage hematopoiesis. There was no significant increase in marrow reticulin fibers. Storage iron was present.    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 He develoed herpes zoster in 04/2008. He developed a pulmonary embolism in 05/2013. He was initially on Xarelto, but is now on Eliquis. He had an episode of pneumonia around this time requiring a brief admission. He developed severe lower leg cramps on 08/07/2014 secondary to hypokalemia. Duplex was negative.   He was treated for C difficile colitis (Flagyl completed 07/30/2015).  He has a chronic indwelling pain pump.  He received  4 cycles of Pomalyst and Decadron (08/27/2015 - 11/19/2015).  Restaging studies document progressive disease.  Kappa free light chains are increasing.  SPEP revealed 0.5 gm/dL monoclonal protein then 1.3 gm/dL.  Bone survey reveals increase conspicuity of subcentimeter lytic lesions in the calvarium.  Bone marrow reveals 30-40% plasma cells.   MUGA on 11/30/2015 revealed an ejection fraction of 46%.  He  is felt not to be a good candidate for Kyprolis.  There were no focal wall motion abnormalities.  He had a stress echo less than 1 year ago.  He has a history of PVCs and atrial fibrillation.  He takes Cardizem prn.  He is s/p 17 weeks of daratumumab (Darzalex) (12/09/2015 - 05/25/2016).  He tolerated treatment well without side effect.  Bone marrow on 02/09/2016 revealed no diagnostic morphologic evidence of plasma cell myeloma.  Marrow was normocellular to hypocellular marrow for age (ranging from 10-40%) with maturing trilineage hematopoiesis and mild multilineage dyspoiesis.  There was patchy mild increase in reticulin.  Storage iron was present.  Flow cytometry revealed no definitive evidence of monoclonality.  There was a non-specific atypical myeloid and monocytic findings with no increase in blasts.  Cytogenetics were normal (46, XY).  He is currently day 133 s/p 2nd autologous stem cell transplant on 06/16/2016.  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  He also experienced atrial fibrillation with intermittent episodes of RVR requiring IV beta blockers.  He is on prophylactic valacyclovir for 1 year post transplant.  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  Work-up on 08/23/2016 revealed the following normal studies:  B12 and folate.  Ferritin was 1379 (high).  Iron studies included a saturation of 56% and TIBC 141 (low).  ESR was 64.  Thrombocytopenia is improving slowly post transplant.  Platelet count is 144,000.  He has persistent hypomagnesemia and receives IV magnesium (2-4 gm) weekly.  Symptomatically, he feels "decent".  He is no longer in a wheelchair.  He has xerostomia.  Weight fluctuates.  He has occasional nausea.  Counts continue to improve slowly.  Creatinine is 1.15.  Magnesium is 1.6.  Plan:  1.  Labs today:  CBC with diff, CMP, Mg. 2.  Review SPEP from last visit.  No monoclonal protein.   Clinical improvement since last visit.  Anticipate starting maintenance chemotherapy post transplant soon. 3.  Patient to wait for lab results and likely IV Mg. 4.  RTC weekly for labs (BMP and Mg) +/- IV Mg. 5.  RTC in 1 month for MD assess, labs (CBC with diff, CMP, SPEP, Mg) +/- Mg.   Lequita Asal, MD  10/31/2016, 9:09 AM

## 2016-10-31 NOTE — Progress Notes (Signed)
Patient offers no complaints today. 

## 2016-11-02 ENCOUNTER — Ambulatory Visit: Payer: Medicare Other | Admitting: Physical Therapy

## 2016-11-02 DIAGNOSIS — R293 Abnormal posture: Secondary | ICD-10-CM

## 2016-11-02 DIAGNOSIS — R2689 Other abnormalities of gait and mobility: Secondary | ICD-10-CM

## 2016-11-02 DIAGNOSIS — R262 Difficulty in walking, not elsewhere classified: Secondary | ICD-10-CM

## 2016-11-02 DIAGNOSIS — M6281 Muscle weakness (generalized): Secondary | ICD-10-CM | POA: Diagnosis not present

## 2016-11-02 NOTE — Therapy (Signed)
[facility] [address] [place], [place], [address] Phone: [phone]   Fax:  [phone]  Physical Therapy Treatment  Patient Details  Name: Logan Perez MRN: [id number] Date of Birth: 08-May-1946 Referring Provider: Nolon Stalls, MD  Encounter Date: 11/02/2016      PT End of Session - 11/02/16 1137    Visit Number 4   Number of Visits 8   Date for PT Re-Evaluation 11/15/16   Authorization - Visit Number 4   Authorization - Number of Visits 10   PT Start Time 0902   PT Stop Time 0959   PT Time Calculation (min) 57 min   Equipment Utilized During Treatment Gait belt   Activity Tolerance Patient tolerated treatment well   Behavior During Therapy [facility] for tasks assessed/performed      Past Medical History:  Diagnosis Date  . Anxiety   . Atrial fibrillation ([place])   . BPH (benign prostatic hyperplasia)   . Complication of anesthesia    BAD HEADACHE NIGHT OF FIRST CATARACT  . Difficulty voiding   . Dysrhythmia    A FIB  . Elevated PSA   . GERD (gastroesophageal reflux disease)   . HLD (hyperlipidemia)   . HOH (hard of hearing)   . Hypertension   . Multiple myeloma ([place])   . Neuropathy ([place])   . Pain    BACK  . Palpitations   . Pneumonia   . Pulmonary embolism ([place])   . Stroke [facility])    TIA    Past Surgical History:  Procedure Laterality Date  . BACK SURGERY  1994  . CATARACT EXTRACTION W/PHACO Right 05/02/2016   Procedure: CATARACT EXTRACTION PHACO AND INTRAOCULAR LENS PLACEMENT (IOC);  Surgeon: Birder Robson, MD;  Location: [facility] ORS;  Service: Ophthalmology;  Laterality: Right;  [place] 1.06 AP% 20.6 CDE 13.70 FLUID PACK LOT # [id number] H  . CATARACT EXTRACTION W/PHACO Left 05/16/2016   Procedure: CATARACT EXTRACTION PHACO AND INTRAOCULAR LENS PLACEMENT (IOC);  Surgeon: Birder Robson, MD;  Location: [facility] ORS;  Service: Ophthalmology;  Laterality: Left;  [place] 01:43 AP% 19.8 CDE 20.45 FLUID PACK LOT  #[id number] H  . KNEE ARTHROSCOPY Left 1992  . LIMBAL STEM CELL TRANSPLANT    . PAIN PUMP IMPLANTATION  2012  . stem cell implant  2008   [facility]    There were no vitals filed for this visit.      Subjective Assessment - 11/02/16 0914    Subjective Pt. states he was a bit stiff this a.m. but is feeling good otherwise. Pt. reports no pain and has no new complaints.    Pertinent History Pt. has multiple myeloma (dx 2008.) Pt. was unable to walk for a while afterward but did PT for ~6 mo. after. Pt states cancer went into remission, no meds 2008-2011, cancer returned 2011/2012. Hx of 4 compression fx. Took chemo drug for 5 years which was beneficial, not currently taking any chemo. Sept 2017 pt had 2nd stem cell transplant, recently pt has returned to going out in public. Before stem cell transplant, pt reports he could walk a mile, but now only walks to mailbox and back since he has lost his endurance. Pt progressed well with home health PT going up and down stairs. Reports 3/10 low back pain and takes an Aleve in the a.m.  Pt has a pain pump for spine for daily use.    Patient Stated Goals Pt. would like to return to a gym-based program to begin  regaining general strength    Pain Score 0-No pain      Therex: Scifit 10 min L6 B UE/LE  (warm-up/no charge) Obstacle course - tandem walk balance, step ups, figure 8s, cone taps, box carry w/ squat  //-bar lateral walking w/ squat. Good form, knee flexion constant throughout TG squats/heel raises/ single leg squats - Pt. Cued to relax neck/forward head posture   Neuro: Bosu - clocks, weight shifting. Good ankle control, posture cuing req. SLS - R/L 3x each. 30 sec holds w/ some UE assist on L Balance w/ feet together - 30 sec. No difficulty noted Tandem walking in //-bars 4x - single UE assist. Working toward more LE control while taking steps  Pt. Is working hard to continue improving LE strength and balance. Pt.'s motivation at home is low but  stated he is going to be active at home this weekend.       PT Long Term Goals - 10/19/16 1753      PT LONG TERM GOAL #1   Title Pt. will score a 56/56 on the Berg Balance Assessment in order to improve functional mobility and balance.    Baseline Pt scored a 54/56 10/19/15   Time 4   Period Weeks   Status New     PT LONG TERM GOAL #2   Title Pt will demonstrate a normalized gait pattern with a wider base of support in order to improve overall mobility.    Baseline Pt. shows some unease while ambulating due to narrow BOS   Time 4   Period Weeks   Status New     PT LONG TERM GOAL #3   Title Pt will ambulate for 10 minutes safely without fatigue in order to increase endurance while in the community.    Baseline Pt. recently progressed from single point cane and reports some fatigue with prolonged activity.    Time 4   Period Weeks   Status New     PT LONG TERM GOAL #4   Title Pt. will participate in a gym-based exercise program with no limitations to promote independence.    Baseline Pt. would like to return to gym to resume exercising regularly   Time 4   Period Weeks   Status New               Plan - 11/02/16 1138    Clinical Impression Statement Pt's endurance beginning to improve with noted increase in distance biked on SciFit (L6) in 10 min (0.6 mi to 0.7 mi.) L SLS improved from last tx with 10 sec hold no UE assist. Pt. demonstrated good ankle stability on BOSU with cuing for posture. Pt. completed hallway obstacle course 4x with some fatigue noted at the end. Pt will continue to benefit from generalized LE strength and endurance traiing to promote a safe gait pattern and continued independence.    Rehab Potential Good   Clinical Impairments Affecting Rehab Potential motivation, active lifestyle, family support.    PT Frequency 2x / week   PT Duration 4 weeks   PT Treatment/Interventions Gait training;Functional mobility training;Therapeutic exercise;Balance  training;Neuromuscular re-education   PT Next Visit Plan continue high level balance and gait activities, strengthening of quads   PT Home Exercise Plan already issued    Consulted and Agree with Plan of Care Patient;Family member/caregiver      Patient will benefit from skilled therapeutic intervention in order to improve the following deficits and impairments:  Abnormal gait, Postural dysfunction, Decreased  activity tolerance, Decreased endurance, Decreased strength, Decreased balance, Difficulty walking  Visit Diagnosis: Muscle weakness (generalized)  Difficulty in walking, not elsewhere classified  Imbalance  Abnormal posture     Problem List Patient Active Problem List   Diagnosis Date Noted  . Multiple myeloma in remission ([place]) 09/19/2016  . Xerostomia 08/23/2016  . Moderate protein-calorie malnutrition ([place]) 08/20/2016  . Thrombocytopenia ([place]) 08/20/2016  . Anemia 08/20/2016  . Nausea without vomiting 08/20/2016  . Atrial fibrillation ([place]) 07/10/2016  . 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 H/O autologous stem cell transplant ([place]) 07/10/2016  . Bone cancer ([place]) 06/15/2016  . Porokeratosis 04/06/2016  . Encounter for adjustment or management of infusion pump 03/23/2016  . Hypomagnesemia 12/01/2015  . Ejection fraction < 50% 11/30/2015  . Chronic pain 08/26/2015  . Presence of intrathecal pump 08/26/2015  . Long term current use of opiate analgesic 08/26/2015  . Long term prescription opiate use 08/26/2015  . Opiate use 08/26/2015  . Encounter for therapeutic drug level monitoring 08/26/2015  . Night muscle spasms 08/26/2015  . Muscle spasticity 08/26/2015  . Neuropathic pain 08/26/2015  . Neurogenic pain 08/26/2015  . Chronic low back pain 08/26/2015  . Lumbar spondylosis 08/26/2015  . Compression fracture of T12 vertebra (HCC) (70-75% magnitude) (with mild retropulsion) 08/26/2015  . Diffuse myofascial pain syndrome  08/26/2015  . Presence of implanted infusion pump (Medtronic, programmable, intrathecal pump) 08/26/2015  . Cancer associated pain 08/26/2015  . Encounter for interrogation of infusion pump 08/26/2015  . Chronic lumbar radicular pain 08/26/2015  . Opiate analgesic contract exists 08/26/2015  . Chronic pain syndrome 08/26/2015  . Chronic anticoagulation (Eliquis) 08/26/2015  . Clostridium difficile diarrhea 07/21/2015  . Diarrhea 07/19/2015  . Multiple myeloma ([place]) 05/04/2015  . BPH with obstruction/lower urinary tract symptoms 04/29/2015  . MI (mitral incompetence) 12/03/2014  . TI (tricuspid incompetence) 12/03/2014  . Paroxysmal atrial fibrillation ([place]) 12/03/2014  . Breathlessness on exertion 12/03/2014  . Essential (primary) hypertension 01/29/2014  . Acid reflux 01/29/2014  . Combined fat and carbohydrate induced hyperlipemia 01/29/2014  . Pulmonary embolism ([place]) 01/29/2014   Pura Spice, PT, DPT # [address] SPT 11/02/2016, 11:44 AM  [facility] [address] [place], [place], [address] Phone: [phone]   Fax:  [phone]  Name: Logan Perez MRN: [id number] Date of Birth: [birth year]

## 2016-11-06 ENCOUNTER — Ambulatory Visit: Payer: Medicare Other | Admitting: Physical Therapy

## 2016-11-06 DIAGNOSIS — M6281 Muscle weakness (generalized): Secondary | ICD-10-CM

## 2016-11-06 DIAGNOSIS — R2689 Other abnormalities of gait and mobility: Secondary | ICD-10-CM

## 2016-11-06 DIAGNOSIS — R293 Abnormal posture: Secondary | ICD-10-CM

## 2016-11-06 DIAGNOSIS — R262 Difficulty in walking, not elsewhere classified: Secondary | ICD-10-CM

## 2016-11-06 NOTE — Therapy (Signed)
[facility] [address] [place], [place], [address] Phone: [phone]   Fax:  [phone]  Physical Therapy Treatment  Patient Details  Name: Logan Perez MRN: [id number] Date of Birth: [birth year] Referring Provider: Nolon Stalls, MD  Encounter Date: 11/06/2016      PT End of Session - 11/06/16 1200    Visit Number 5   Number of Visits 8   Date for PT Re-Evaluation 11/15/16   Authorization - Visit Number 5   Authorization - Number of Visits 10   PT Start Time 0941   PT Stop Time 1038   PT Time Calculation (min) 57 min   Equipment Utilized During Treatment Gait belt   Activity Tolerance Patient tolerated treatment well   Behavior During Therapy [facility] for tasks assessed/performed      Past Medical History:  Diagnosis Date  . Anxiety   . Atrial fibrillation ([place])   . BPH (benign prostatic hyperplasia)   . Complication of anesthesia    BAD HEADACHE NIGHT OF FIRST CATARACT  . Difficulty voiding   . Dysrhythmia    A FIB  . Elevated PSA   . GERD (gastroesophageal reflux disease)   . HLD (hyperlipidemia)   . HOH (hard of hearing)   . Hypertension   . Multiple myeloma ([place])   . Neuropathy ([place])   . Pain    BACK  . Palpitations   . Pneumonia   . Pulmonary embolism ([place])   . Stroke [facility])    TIA    Past Surgical History:  Procedure Laterality Date  . BACK SURGERY  1994  . CATARACT EXTRACTION W/PHACO Right 05/02/2016   Procedure: CATARACT EXTRACTION PHACO AND INTRAOCULAR LENS PLACEMENT (IOC);  Surgeon: Birder Robson, MD;  Location: [facility] ORS;  Service: Ophthalmology;  Laterality: Right;  [place] 1.06 AP% 20.6 CDE 13.70 FLUID PACK LOT # [id number] H  . CATARACT EXTRACTION W/PHACO Left 05/16/2016   Procedure: CATARACT EXTRACTION PHACO AND INTRAOCULAR LENS PLACEMENT (IOC);  Surgeon: Birder Robson, MD;  Location: [facility] ORS;  Service: Ophthalmology;  Laterality: Left;  [place] 01:43 AP% 19.8 CDE 20.45 FLUID PACK LOT  #[id number] H  . KNEE ARTHROSCOPY Left 1992  . LIMBAL STEM CELL TRANSPLANT    . PAIN PUMP IMPLANTATION  2012  . stem cell implant  2008   [facility]    There were no vitals filed for this visit.      Subjective Assessment - 11/06/16 1158    Subjective Pt. states he had a good weekend and took the dog for a walk in the rain. Pt. reports his endurance has been "okay."    Pertinent History Pt. has multiple myeloma (dx 2008.) Pt. was unable to walk for a while afterward but did PT for ~6 mo. after. Pt states cancer went into remission, no meds 2008-2011, cancer returned 2011/2012. Hx of 4 compression fx. Took chemo drug for 5 years which was beneficial, not currently taking any chemo. Sept 2017 pt had 2nd stem cell transplant, recently pt has returned to going out in public. Before stem cell transplant, pt reports he could walk a mile, but now only walks to mailbox and back since he has lost his endurance. Pt progressed well with home health PT going up and down stairs. Reports 3/10 low back pain and takes an Aleve in the a.m.  Pt has a pain pump for spine for daily use.    Patient Stated Goals Pt. would like to return to a gym-based program to  begin regaining general strength       Therex: SciFit 10 min B UE/LE (no charge) Walking lunges 4x in //-bars - verbal cuing to maintain good knee over toes alignment Side stepping //-bars - able to maintain squat throughout exercise 3" eccentric step-downs - pt. Experience difficulty with proper body mechanics, req. Max cuing for sequencing of task.  BOSU clocks/weight shifting - B UE assist  TG squats/heel raises 20x each - no fatigue noted, better body position of neck (able to relax)    Neuro: Hallway ambulating 10 ft. W/ eyes open and eyes closed w/ instances of LOB during eyes closed trials Walking marches with alternating cross-body touches Cone taps (single leg) R/L - CGA with >LOB standing on LLE than RLE.   Pt response for medical necessity: Pt.  Will continue to benefit from skilled PT in order to keep progressing LE strengthening and balance training.         PT Long Term Goals - 10/19/16 1753      PT LONG TERM GOAL #1   Title Pt. will score a 56/56 on the Berg Balance Assessment in order to improve functional mobility and balance.    Baseline Pt scored a 54/56 10/19/15   Time 4   Period Weeks   Status New     PT LONG TERM GOAL #2   Title Pt will demonstrate a normalized gait pattern with a wider base of support in order to improve overall mobility.    Baseline Pt. shows some unease while ambulating due to narrow BOS   Time 4   Period Weeks   Status New     PT LONG TERM GOAL #3   Title Pt will ambulate for 10 minutes safely without fatigue in order to increase endurance while in the community.    Baseline Pt. recently progressed from single point cane and reports some fatigue with prolonged activity.    Time 4   Period Weeks   Status New     PT LONG TERM GOAL #4   Title Pt. will participate in a gym-based exercise program with no limitations to promote independence.    Baseline Pt. would like to return to gym to resume exercising regularly   Time 4   Period Weeks   Status New            Plan - 11/06/16 1201    Clinical Impression Statement Pt. demonstrated improved balance in //-bars with increasing ankle stability with verbal and tactile cuing to correct body mechanics. Pt. showing good form with TG squats and minimal verbal cuing to maintain midline knee alignment.  Standing star cone taps improved with some LOB episodes (CGA/gait belt used) but able to correct after cuing for posture. Pt. able to complete standing Nautilus exercises without any fatigue. Pt will continue to benefit from skilled PT to keep improving general LE strength and functional capacity for activities of daily living.    Rehab Potential Good   Clinical Impairments Affecting Rehab Potential motivation, active lifestyle, family support.     PT Frequency 2x / week   PT Duration 4 weeks   PT Treatment/Interventions Gait training;Functional mobility training;Therapeutic exercise;Balance training;Neuromuscular re-education   PT Next Visit Plan continue high level balance and gait activities, strengthening of quads   PT Home Exercise Plan already issued    Consulted and Agree with Plan of Care Patient      Patient will benefit from skilled therapeutic intervention in order to improve the following deficits  and impairments:  Abnormal gait, Postural dysfunction, Decreased activity tolerance, Decreased endurance, Decreased strength, Decreased balance, Difficulty walking  Visit Diagnosis: Muscle weakness (generalized)  Difficulty in walking, not elsewhere classified  Imbalance  Abnormal posture     Problem List Patient Active Problem List   Diagnosis Date Noted  . Multiple myeloma in remission ([place]) 09/19/2016  . Xerostomia 08/23/2016  . Moderate protein-calorie malnutrition ([place]) 08/20/2016  . Thrombocytopenia ([place]) 08/20/2016  . Anemia 08/20/2016  . Nausea without vomiting 08/20/2016  . Atrial fibrillation ([place]) 07/10/2016  . 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 H/O autologous stem cell transplant ([place]) 07/10/2016  . Bone cancer ([place]) 06/15/2016  . Porokeratosis 04/06/2016  . Encounter for adjustment or management of infusion pump 03/23/2016  . Hypomagnesemia 12/01/2015  . Ejection fraction < 50% 11/30/2015  . Chronic pain 08/26/2015  . Presence of intrathecal pump 08/26/2015  . Long term current use of opiate analgesic 08/26/2015  . Long term prescription opiate use 08/26/2015  . Opiate use 08/26/2015  . Encounter for therapeutic drug level monitoring 08/26/2015  . Night muscle spasms 08/26/2015  . Muscle spasticity 08/26/2015  . Neuropathic pain 08/26/2015  . Neurogenic pain 08/26/2015  . Chronic low back pain 08/26/2015  . Lumbar spondylosis 08/26/2015  .  Compression fracture of T12 vertebra (HCC) (70-75% magnitude) (with mild retropulsion) 08/26/2015  . Diffuse myofascial pain syndrome 08/26/2015  . Presence of implanted infusion pump (Medtronic, programmable, intrathecal pump) 08/26/2015  . Cancer associated pain 08/26/2015  . Encounter for interrogation of infusion pump 08/26/2015  . Chronic lumbar radicular pain 08/26/2015  . Opiate analgesic contract exists 08/26/2015  . Chronic pain syndrome 08/26/2015  . Chronic anticoagulation (Eliquis) 08/26/2015  . Clostridium difficile diarrhea 07/21/2015  . Diarrhea 07/19/2015  . Multiple myeloma ([place]) 05/04/2015  . BPH with obstruction/lower urinary tract symptoms 04/29/2015  . MI (mitral incompetence) 12/03/2014  . TI (tricuspid incompetence) 12/03/2014  . Paroxysmal atrial fibrillation ([place]) 12/03/2014  . Breathlessness on exertion 12/03/2014  . Essential (primary) hypertension 01/29/2014  . Acid reflux 01/29/2014  . Combined fat and carbohydrate induced hyperlipemia 01/29/2014  . Pulmonary embolism ([place]) 01/29/2014   Pura Spice, PT, DPT # [id number] Willodean Rosenthal, SPT 11/07/2016, 8:47 AM  [facility] [address] [place], [place], [address] Phone: [phone]   Fax:  [phone]  Name: Logan Perez MRN: [id number] Date of Birth: 1946/01/15

## 2016-11-07 ENCOUNTER — Inpatient Hospital Stay: Payer: Medicare Other

## 2016-11-07 DIAGNOSIS — C9002 Multiple myeloma in relapse: Secondary | ICD-10-CM | POA: Diagnosis not present

## 2016-11-07 DIAGNOSIS — C9001 Multiple myeloma in remission: Secondary | ICD-10-CM

## 2016-11-07 DIAGNOSIS — C9000 Multiple myeloma not having achieved remission: Secondary | ICD-10-CM

## 2016-11-07 LAB — CBC WITH DIFFERENTIAL/PLATELET
Basophils Absolute: 0 10*3/uL (ref 0–0.1)
Basophils Relative: 0 %
Eosinophils Absolute: 0.1 10*3/uL (ref 0–0.7)
Eosinophils Relative: 2 %
HCT: 29.8 % — ABNORMAL LOW (ref 40.0–52.0)
Hemoglobin: 10.4 g/dL — ABNORMAL LOW (ref 13.0–18.0)
Lymphocytes Relative: 18 %
Lymphs Abs: 1.2 10*3/uL (ref 1.0–3.6)
MCH: 38.4 pg — ABNORMAL HIGH (ref 26.0–34.0)
MCHC: 34.8 g/dL (ref 32.0–36.0)
MCV: 110.3 fL — ABNORMAL HIGH (ref 80.0–100.0)
Monocytes Absolute: 0.5 10*3/uL (ref 0.2–1.0)
Monocytes Relative: 8 %
Neutro Abs: 4.6 10*3/uL (ref 1.4–6.5)
Neutrophils Relative %: 72 %
Platelets: 137 10*3/uL — ABNORMAL LOW (ref 150–440)
RBC: 2.7 MIL/uL — ABNORMAL LOW (ref 4.40–5.90)
RDW: 13.1 % (ref 11.5–14.5)
WBC: 6.5 10*3/uL (ref 3.8–10.6)

## 2016-11-07 LAB — BASIC METABOLIC PANEL
Anion gap: 5 (ref 5–15)
BUN: 12 mg/dL (ref 6–20)
CO2: 25 mmol/L (ref 22–32)
Calcium: 8.9 mg/dL (ref 8.9–10.3)
Chloride: 109 mmol/L (ref 101–111)
Creatinine, Ser: 1.16 mg/dL (ref 0.61–1.24)
GFR calc Af Amer: >60 mL/min (ref >60)
GFR calc non Af Amer: >60 mL/min (ref >60)
Glucose, Bld: 156 mg/dL — ABNORMAL HIGH (ref 65–99)
Potassium: 3.9 mmol/L (ref 3.5–5.1)
Sodium: 139 mmol/L (ref 135–145)

## 2016-11-07 LAB — MAGNESIUM: Magnesium: 1.6 mg/dL — ABNORMAL LOW (ref 1.7–2.4)

## 2016-11-07 MED ORDER — MAGNESIUM SULFATE 2 GM/50ML IV SOLN
2.0000 g | Freq: Once | INTRAVENOUS | Status: AC
  Administered 2016-11-07: 2 g via INTRAVENOUS
  Filled 2016-11-07: qty 50

## 2016-11-07 NOTE — Progress Notes (Signed)
Mg 1.6 today. Per Dr Mike Gip give pt 2g Mg IV

## 2016-11-08 ENCOUNTER — Other Ambulatory Visit: Payer: Self-pay | Admitting: Hematology and Oncology

## 2016-11-09 ENCOUNTER — Ambulatory Visit: Payer: Medicare Other | Attending: Hematology and Oncology | Admitting: Physical Therapy

## 2016-11-09 DIAGNOSIS — M6281 Muscle weakness (generalized): Secondary | ICD-10-CM

## 2016-11-09 DIAGNOSIS — R293 Abnormal posture: Secondary | ICD-10-CM | POA: Insufficient documentation

## 2016-11-09 DIAGNOSIS — R2689 Other abnormalities of gait and mobility: Secondary | ICD-10-CM | POA: Insufficient documentation

## 2016-11-09 DIAGNOSIS — R262 Difficulty in walking, not elsewhere classified: Secondary | ICD-10-CM | POA: Diagnosis present

## 2016-11-10 NOTE — Therapy (Addendum)
[facility] [address] [place], [place], [address] Phone: [phone]   Fax:  [phone]  Physical Therapy Treatment  Patient Details  Name: Logan Perez MRN: [id number] Date of Birth: [birth year] Referring Provider: Nolon Stalls, MD  Encounter Date: 11/09/2016      PT End of Session - 11/10/16 1622    Visit Number 6   Number of Visits 8   Date for PT Re-Evaluation 11/15/16   Authorization - Visit Number 6   Authorization - Number of Visits 10   PT Start Time [phone]   PT Stop Time 1038   PT Time Calculation (min) 56 min   Equipment Utilized During Treatment Gait belt   Activity Tolerance Patient tolerated treatment well   Behavior During Therapy [facility] for tasks assessed/performed      Past Medical History:  Diagnosis Date  . Anxiety   . Atrial fibrillation ([place])   . BPH (benign prostatic hyperplasia)   . Complication of anesthesia    BAD HEADACHE NIGHT OF FIRST CATARACT  . Difficulty voiding   . Dysrhythmia    A FIB  . Elevated PSA   . GERD (gastroesophageal reflux disease)   . HLD (hyperlipidemia)   . HOH (hard of hearing)   . Hypertension   . Multiple myeloma ([place])   . Neuropathy ([place])   . Pain    BACK  . Palpitations   . Pneumonia   . Pulmonary embolism ([place])   . Stroke [facility])    TIA    Past Surgical History:  Procedure Laterality Date  . BACK SURGERY  1994  . CATARACT EXTRACTION W/PHACO Right 05/02/2016   Procedure: CATARACT EXTRACTION PHACO AND INTRAOCULAR LENS PLACEMENT (IOC);  Surgeon: Birder Robson, MD;  Location: [facility] ORS;  Service: Ophthalmology;  Laterality: Right;  [place] 1.06 AP% 20.6 CDE 13.70 FLUID PACK LOT # [id number] H  . CATARACT EXTRACTION W/PHACO Left 05/16/2016   Procedure: CATARACT EXTRACTION PHACO AND INTRAOCULAR LENS PLACEMENT (IOC);  Surgeon: Birder Robson, MD;  Location: [facility] ORS;  Service: Ophthalmology;  Laterality: Left;  [place] 01:43 AP% 19.8 CDE 20.45 FLUID PACK LOT  #[id number] H  . KNEE ARTHROSCOPY Left 1992  . LIMBAL STEM CELL TRANSPLANT    . PAIN PUMP IMPLANTATION  2012  . stem cell implant  2008   [facility]    There were no vitals filed for this visit.      Subjective Assessment - 11/10/16 1622    Patient is accompained by: Family member   Pertinent History Pt. has multiple myeloma (dx 2008.) Pt. was unable to walk for a while afterward but did PT for ~6 mo. after. Pt states cancer went into remission, no meds 2008-2011, cancer returned 2011/2012. Hx of 4 compression fx. Took chemo drug for 5 years which was beneficial, not currently taking any chemo. Sept 2017 pt had 2nd stem cell transplant, recently pt has returned to going out in public. Before stem cell transplant, pt reports he could walk a mile, but now only walks to mailbox and back since he has lost his endurance. Pt progressed well with home health PT going up and down stairs. Reports 3/10 low back pain and takes an Aleve in the a.m.  Pt has a pain pump for spine for daily use.    Patient Stated Goals Pt. would like to return to a gym-based program to begin regaining general strength    Currently in Pain? No/denies      Pt. states he is  doing okay.  No new complaints.  Pts. wife entered PT gym and states that pt. is not too compliant with HEP.    Therex: SciFit 10 min B UE/LE (warm-up/no charge) Walking lunges 5x in //-bars - verbal cuing to maintain good knee over toes alignment Side stepping //-bars - able to maintain squat throughout exercise Step ups/ downs on Airex with min. To no UE assist 10x2 each.  2.5# ankle wts. (hip flexion/ abd./ hip ext./ heel raises)- 20x each.  Walking in clinic with ankle wts. With cuing to increase hip flexion/ step pattern.    TG squats/heel raises 20x each - (maintain knee in midline)   Neuro: Hallway obstacle course (Airex/ cone taps/ step ups/ step overs/ turning).   Walking marches with alternating cross-body touches Resisted gait 1BTB 5x all 4-planes  of movement.   Pt response for medical necessity: Pt. Will continue to benefit from skilled PT in order to keep progressing LE strengthening and balance training.      Pt. demonstrated improved balance in clinic/hallway with increase ankle stability with verbal and tactile cuing to correct body mechanics.  Pt. has improved ability to maintain midline knee alignment with squats/ Scifit/ step ups.  Standing cone taps improved with some LOB episodes (CGA/gait belt used) but able to correct after cuing for posture.  Minimal c/o overall fatigue in B LE with balance tasks/ TM walking at 2.2 mph.  Pt will continue to benefit from skilled PT to keep improving general LE strength and functional capacity for activities of daily living.        PT Long Term Goals - 10/19/16 1753      PT LONG TERM GOAL #1   Title Pt. will score a 56/56 on the Berg Balance Assessment in order to improve functional mobility and balance.    Baseline Pt scored a 54/56 10/19/15   Time 4   Period Weeks   Status New     PT LONG TERM GOAL #2   Title Pt will demonstrate a normalized gait pattern with a wider base of support in order to improve overall mobility.    Baseline Pt. shows some unease while ambulating due to narrow BOS   Time 4   Period Weeks   Status New     PT LONG TERM GOAL #3   Title Pt will ambulate for 10 minutes safely without fatigue in order to increase endurance while in the community.    Baseline Pt. recently progressed from single point cane and reports some fatigue with prolonged activity.    Time 4   Period Weeks   Status New     PT LONG TERM GOAL #4   Title Pt. will participate in a gym-based exercise program with no limitations to promote independence.    Baseline Pt. would like to return to gym to resume exercising regularly   Time 4   Period Weeks   Status New             Plan - 11/10/16 1623    Rehab Potential Good   Clinical Impairments Affecting Rehab Potential motivation,  active lifestyle, family support.    PT Frequency 2x / week   PT Duration 4 weeks   PT Treatment/Interventions Gait training;Functional mobility training;Therapeutic exercise;Balance training;Neuromuscular re-education   PT Next Visit Plan continue high level balance and gait activities, strengthening of quads   PT Home Exercise Plan already issued       Patient will benefit from skilled therapeutic intervention  in order to improve the following deficits and impairments:  Abnormal gait, Postural dysfunction, Decreased activity tolerance, Decreased endurance, Decreased strength, Decreased balance, Difficulty walking  Visit Diagnosis: Muscle weakness (generalized)  Difficulty in walking, not elsewhere classified  Imbalance  Abnormal posture     Problem List Patient Active Problem List   Diagnosis Date Noted  . Multiple myeloma in remission ([place]) 09/19/2016  . Xerostomia 08/23/2016  . Moderate protein-calorie malnutrition ([place]) 08/20/2016  . Thrombocytopenia ([place]) 08/20/2016  . Anemia 08/20/2016  . Nausea without vomiting 08/20/2016  . Atrial fibrillation ([place]) 07/10/2016  . 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 H/O autologous stem cell transplant ([place]) 07/10/2016  . Bone cancer ([place]) 06/15/2016  . Porokeratosis 04/06/2016  . Encounter for adjustment or management of infusion pump 03/23/2016  . Hypomagnesemia 12/01/2015  . Ejection fraction < 50% 11/30/2015  . Chronic pain 08/26/2015  . Presence of intrathecal pump 08/26/2015  . Long term current use of opiate analgesic 08/26/2015  . Long term prescription opiate use 08/26/2015  . Opiate use 08/26/2015  . Encounter for therapeutic drug level monitoring 08/26/2015  . Night muscle spasms 08/26/2015  . Muscle spasticity 08/26/2015  . Neuropathic pain 08/26/2015  . Neurogenic pain 08/26/2015  . Chronic low back pain 08/26/2015  . Lumbar spondylosis 08/26/2015  . Compression fracture  of T12 vertebra (HCC) (70-75% magnitude) (with mild retropulsion) 08/26/2015  . Diffuse myofascial pain syndrome 08/26/2015  . Presence of implanted infusion pump (Medtronic, programmable, intrathecal pump) 08/26/2015  . Cancer associated pain 08/26/2015  . Encounter for interrogation of infusion pump 08/26/2015  . Chronic lumbar radicular pain 08/26/2015  . Opiate analgesic contract exists 08/26/2015  . Chronic pain syndrome 08/26/2015  . Chronic anticoagulation (Eliquis) 08/26/2015  . Clostridium difficile diarrhea 07/21/2015  . Diarrhea 07/19/2015  . Multiple myeloma ([place]) 05/04/2015  . BPH with obstruction/lower urinary tract symptoms 04/29/2015  . MI (mitral incompetence) 12/03/2014  . TI (tricuspid incompetence) 12/03/2014  . Paroxysmal atrial fibrillation ([place]) 12/03/2014  . Breathlessness on exertion 12/03/2014  . Essential (primary) hypertension 01/29/2014  . Acid reflux 01/29/2014  . Combined fat and carbohydrate induced hyperlipemia 01/29/2014  . Pulmonary embolism ([place]) 01/29/2014   Pura Spice, PT, DPT # [id number] 11/10/2016, 4:24 PM  [facility] [address] [place], [place], [address] Phone: [phone]   Fax:  [phone]  Name: Kumar Falwell MRN: [id number] Date of Birth: May 31, 1946

## 2016-11-13 ENCOUNTER — Encounter: Payer: Self-pay | Admitting: Physical Therapy

## 2016-11-13 ENCOUNTER — Ambulatory Visit: Payer: Medicare Other | Admitting: Physical Therapy

## 2016-11-13 DIAGNOSIS — R293 Abnormal posture: Secondary | ICD-10-CM

## 2016-11-13 DIAGNOSIS — R2689 Other abnormalities of gait and mobility: Secondary | ICD-10-CM

## 2016-11-13 DIAGNOSIS — R262 Difficulty in walking, not elsewhere classified: Secondary | ICD-10-CM

## 2016-11-13 DIAGNOSIS — M6281 Muscle weakness (generalized): Secondary | ICD-10-CM

## 2016-11-13 NOTE — Therapy (Signed)
[place] [facility] [address] [place], [place], [address] Phone: [phone]   Fax:  [phone]  Physical Therapy Treatment  Patient Details  Name: Logan Perez MRN: [id number] Date of Birth: 1946-03-10 Referring Provider: Nolon Stalls, MD  Encounter Date: 11/13/2016      PT End of Session - 11/13/16 1128    Visit Number 7   Number of Visits 8   Date for PT Re-Evaluation 11/15/16   Authorization - Visit Number 7   Authorization - Number of Visits 10   PT Start Time 0941   PT Stop Time 1043   PT Time Calculation (min) 62 min   Equipment Utilized During Treatment Gait belt   Activity Tolerance Patient tolerated treatment well   Behavior During Therapy [facility] for tasks assessed/performed      Past Medical History:  Diagnosis Date  . Anxiety   . Atrial fibrillation ([place])   . BPH (benign prostatic hyperplasia)   . Complication of anesthesia    BAD HEADACHE NIGHT OF FIRST CATARACT  . Difficulty voiding   . Dysrhythmia    A FIB  . Elevated PSA   . GERD (gastroesophageal reflux disease)   . HLD (hyperlipidemia)   . HOH (hard of hearing)   . Hypertension   . Multiple myeloma ([place])   . Neuropathy ([place])   . Pain    BACK  . Palpitations   . Pneumonia   . Pulmonary embolism ([place])   . Stroke [facility])    TIA    Past Surgical History:  Procedure Laterality Date  . BACK SURGERY  1994  . CATARACT EXTRACTION W/PHACO Right 05/02/2016   Procedure: CATARACT EXTRACTION PHACO AND INTRAOCULAR LENS PLACEMENT (IOC);  Surgeon: Birder Robson, MD;  Location: [facility] ORS;  Service: Ophthalmology;  Laterality: Right;  [place] 1.06 AP% 20.6 CDE 13.70 FLUID PACK LOT # [id number] H  . CATARACT EXTRACTION W/PHACO Left 05/16/2016   Procedure: CATARACT EXTRACTION PHACO AND INTRAOCULAR LENS PLACEMENT (IOC);  Surgeon: Birder Robson, MD;  Location: [facility] ORS;  Service: Ophthalmology;  Laterality: Left;  [place] 01:43 AP% 19.8 CDE 20.45 FLUID PACK LOT  #[id number] H  . KNEE ARTHROSCOPY Left 1992  . LIMBAL STEM CELL TRANSPLANT    . PAIN PUMP IMPLANTATION  2012  . stem cell implant  2008   [facility]    There were no vitals filed for this visit.      Subjective Assessment - 11/13/16 1126    Subjective Pt. states he is doing well. He has no new complaints and enjoyed watching the superbowl.    Pertinent History Pt. has multiple myeloma (dx 2008.) Pt. was unable to walk for a while afterward but did PT for ~6 mo. after. Pt states cancer went into remission, no meds 2008-2011, cancer returned 2011/2012. Hx of 4 compression fx. Took chemo drug for 5 years which was beneficial, not currently taking any chemo. Sept 2017 pt had 2nd stem cell transplant, recently pt has returned to going out in public. Before stem cell transplant, pt reports he could walk a mile, but now only walks to mailbox and back since he has lost his endurance. Pt progressed well with home health PT going up and down stairs. Reports 3/10 low back pain and takes an Aleve in the a.m.  Pt has a pain pump for spine for daily use.    Patient Stated Goals Pt. would like to return to a gym-based program to begin regaining general strength    Currently  in Pain? No/denies   Pain Score 0-No pain       There Ex Treadmill 2.2-2.4 for 10 minutes for improved capacity for ambulation 2.5# ankle wts (hip abd/ext/marching) -2 sets of 10x. Verbal cues for upright posture Reciprocal Step ups on first step 2x 10 each leg, no UE support, cues for upright posture Sit to stand from chair 2x10 no UE support SciFit 10 min B UE/LE (cool down-no charge)  Neuro Re-ed Hallway obstacle course (weaving through cones, cone taps, step overs, turning) x 3. Cues for sequencing of task, CGA for cone touches. LOB occasionally with left cone touch.  Obstacle course in // bars (step over, cone tap, turning) x 3 Standing on blue foam, (eyes open, eyes closed), Standing on blue foam SLS with single UE support, trunk sway  ~20 seconds.    Pt response for medical necessity: Pt. Will continue to benefit from skilled PT in order to keep progressing LE strengthening, balance training, and capacity for functional activities of daily life.           PT Long Term Goals - 10/19/16 1753      PT LONG TERM GOAL #1   Title Pt. will score a 56/56 on the Berg Balance Assessment in order to improve functional mobility and balance.    Baseline Pt scored a 54/56 10/19/15   Time 4   Period Weeks   Status New     PT LONG TERM GOAL #2   Title Pt will demonstrate a normalized gait pattern with a wider base of support in order to improve overall mobility.    Baseline Pt. shows some unease while ambulating due to narrow BOS   Time 4   Period Weeks   Status New     PT LONG TERM GOAL #3   Title Pt will ambulate for 10 minutes safely without fatigue in order to increase endurance while in the community.    Baseline Pt. recently progressed from single point cane and reports some fatigue with prolonged activity.    Time 4   Period Weeks   Status New     PT LONG TERM GOAL #4   Title Pt. will participate in a gym-based exercise program with no limitations to promote independence.    Baseline Pt. would like to return to gym to resume exercising regularly   Time 4   Period Weeks   Status New               Plan - 11/13/16 1129    Clinical Impression Statement Patient presented to physical therapy session today with improved ability to negotiate obstacles while ambulating.  Patient has decreased episodes of LOB and scissoring when ambulating . When performing dynamic single limb stance activities such as toe tapping a cone patient experienced LOB and scissoring to the right when using his left leg to tap a cone. Patient continues to require frequent cueing for upright posture when performing activities and has noted compensations for BLE weakness such as externally rotating right leg when performing hip abduction  exercises in // bars. Pt. has improved ability to control knee midline orientation when performing step up/down. Patient would benefit from continued skilled therapy for generalized strengthening, stability, and improved capacity to perform functional activities of daily living.    Rehab Potential Good   Clinical Impairments Affecting Rehab Potential motivation, active lifestyle, family support.    PT Frequency 2x / week   PT Duration 4 weeks   PT Treatment/Interventions Gait  training;Functional mobility training;Therapeutic exercise;Balance training;Neuromuscular re-education   PT Next Visit Plan continue high level balance and gait activities, strengthening of quads.   CHECK GOALS/SCHEDULE NEXT TX SESSION.     PT Home Exercise Plan already issued    Consulted and Agree with Plan of Care Patient      Patient will benefit from skilled therapeutic intervention in order to improve the following deficits and impairments:  Abnormal gait, Postural dysfunction, Decreased activity tolerance, Decreased endurance, Decreased strength, Decreased balance, Difficulty walking  Visit Diagnosis: Muscle weakness (generalized)  Difficulty in walking, not elsewhere classified  Imbalance  Abnormal posture     Problem List Patient Active Problem List   Diagnosis Date Noted  . Multiple myeloma in remission ([place]) 09/19/2016  . Xerostomia 08/23/2016  . Moderate protein-calorie malnutrition ([place]) 08/20/2016  . Thrombocytopenia ([place]) 08/20/2016  . Anemia 08/20/2016  . Nausea without vomiting 08/20/2016  . Atrial fibrillation ([place]) 07/10/2016  . 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 H/O autologous stem cell transplant ([place]) 07/10/2016  . Bone cancer ([place]) 06/15/2016  . Porokeratosis 04/06/2016  . Encounter for adjustment or management of infusion pump 03/23/2016  . Hypomagnesemia 12/01/2015  . Ejection fraction < 50% 11/30/2015  . Chronic pain 08/26/2015  .  Presence of intrathecal pump 08/26/2015  . Long term current use of opiate analgesic 08/26/2015  . Long term prescription opiate use 08/26/2015  . Opiate use 08/26/2015  . Encounter for therapeutic drug level monitoring 08/26/2015  . Night muscle spasms 08/26/2015  . Muscle spasticity 08/26/2015  . Neuropathic pain 08/26/2015  . Neurogenic pain 08/26/2015  . Chronic low back pain 08/26/2015  . Lumbar spondylosis 08/26/2015  . Compression fracture of T12 vertebra (HCC) (70-75% magnitude) (with mild retropulsion) 08/26/2015  . Diffuse myofascial pain syndrome 08/26/2015  . Presence of implanted infusion pump (Medtronic, programmable, intrathecal pump) 08/26/2015  . Cancer associated pain 08/26/2015  . Encounter for interrogation of infusion pump 08/26/2015  . Chronic lumbar radicular pain 08/26/2015  . Opiate analgesic contract exists 08/26/2015  . Chronic pain syndrome 08/26/2015  . Chronic anticoagulation (Eliquis) 08/26/2015  . Clostridium difficile diarrhea 07/21/2015  . Diarrhea 07/19/2015  . Multiple myeloma ([place]) 05/04/2015  . BPH with obstruction/lower urinary tract symptoms 04/29/2015  . MI (mitral incompetence) 12/03/2014  . TI (tricuspid incompetence) 12/03/2014  . Paroxysmal atrial fibrillation ([place]) 12/03/2014  . Breathlessness on exertion 12/03/2014  . Essential (primary) hypertension 01/29/2014  . Acid reflux 01/29/2014  . Combined fat and carbohydrate induced hyperlipemia 01/29/2014  . Pulmonary embolism ([place]) 01/29/2014   Pura Spice, PT, DPT # [id number] Janna Arch, SPT 11/14/2016, 7:47 AM  [facility] [address] [place], [place], [address] Phone: [phone]   Fax:  [phone]  Name: Logan Perez MRN: [id number] Date of Birth: 06-27-1946

## 2016-11-14 ENCOUNTER — Other Ambulatory Visit: Payer: Self-pay | Admitting: Pain Medicine

## 2016-11-14 ENCOUNTER — Inpatient Hospital Stay: Payer: Medicare Other | Attending: Hematology and Oncology

## 2016-11-14 ENCOUNTER — Inpatient Hospital Stay: Payer: Medicare Other

## 2016-11-14 DIAGNOSIS — G8929 Other chronic pain: Secondary | ICD-10-CM | POA: Diagnosis not present

## 2016-11-14 DIAGNOSIS — Z86711 Personal history of pulmonary embolism: Secondary | ICD-10-CM | POA: Insufficient documentation

## 2016-11-14 DIAGNOSIS — C9001 Multiple myeloma in remission: Secondary | ICD-10-CM

## 2016-11-14 DIAGNOSIS — Z800 Family history of malignant neoplasm of digestive organs: Secondary | ICD-10-CM | POA: Insufficient documentation

## 2016-11-14 DIAGNOSIS — I493 Ventricular premature depolarization: Secondary | ICD-10-CM | POA: Diagnosis not present

## 2016-11-14 DIAGNOSIS — C9002 Multiple myeloma in relapse: Secondary | ICD-10-CM

## 2016-11-14 DIAGNOSIS — Z8673 Personal history of transient ischemic attack (TIA), and cerebral infarction without residual deficits: Secondary | ICD-10-CM | POA: Diagnosis not present

## 2016-11-14 DIAGNOSIS — K117 Disturbances of salivary secretion: Secondary | ICD-10-CM | POA: Insufficient documentation

## 2016-11-14 DIAGNOSIS — K219 Gastro-esophageal reflux disease without esophagitis: Secondary | ICD-10-CM | POA: Diagnosis not present

## 2016-11-14 DIAGNOSIS — Z8052 Family history of malignant neoplasm of bladder: Secondary | ICD-10-CM | POA: Insufficient documentation

## 2016-11-14 DIAGNOSIS — N400 Enlarged prostate without lower urinary tract symptoms: Secondary | ICD-10-CM | POA: Insufficient documentation

## 2016-11-14 DIAGNOSIS — D696 Thrombocytopenia, unspecified: Secondary | ICD-10-CM | POA: Insufficient documentation

## 2016-11-14 DIAGNOSIS — Z79899 Other long term (current) drug therapy: Secondary | ICD-10-CM | POA: Diagnosis not present

## 2016-11-14 DIAGNOSIS — I10 Essential (primary) hypertension: Secondary | ICD-10-CM | POA: Diagnosis not present

## 2016-11-14 DIAGNOSIS — C9000 Multiple myeloma not having achieved remission: Secondary | ICD-10-CM

## 2016-11-14 DIAGNOSIS — I4891 Unspecified atrial fibrillation: Secondary | ICD-10-CM | POA: Diagnosis not present

## 2016-11-14 DIAGNOSIS — D638 Anemia in other chronic diseases classified elsewhere: Secondary | ICD-10-CM | POA: Diagnosis not present

## 2016-11-14 DIAGNOSIS — M62838 Other muscle spasm: Secondary | ICD-10-CM

## 2016-11-14 DIAGNOSIS — Z5112 Encounter for antineoplastic immunotherapy: Secondary | ICD-10-CM | POA: Diagnosis not present

## 2016-11-14 DIAGNOSIS — E785 Hyperlipidemia, unspecified: Secondary | ICD-10-CM | POA: Insufficient documentation

## 2016-11-14 DIAGNOSIS — R1011 Right upper quadrant pain: Secondary | ICD-10-CM | POA: Insufficient documentation

## 2016-11-14 DIAGNOSIS — Z9484 Stem cells transplant status: Secondary | ICD-10-CM | POA: Insufficient documentation

## 2016-11-14 DIAGNOSIS — Z7901 Long term (current) use of anticoagulants: Secondary | ICD-10-CM | POA: Insufficient documentation

## 2016-11-14 DIAGNOSIS — Z87891 Personal history of nicotine dependence: Secondary | ICD-10-CM | POA: Diagnosis not present

## 2016-11-14 DIAGNOSIS — M5489 Other dorsalgia: Secondary | ICD-10-CM | POA: Diagnosis not present

## 2016-11-14 DIAGNOSIS — Z87311 Personal history of (healed) other pathological fracture: Secondary | ICD-10-CM | POA: Diagnosis not present

## 2016-11-14 DIAGNOSIS — Z8042 Family history of malignant neoplasm of prostate: Secondary | ICD-10-CM | POA: Insufficient documentation

## 2016-11-14 DIAGNOSIS — Z978 Presence of other specified devices: Secondary | ICD-10-CM | POA: Insufficient documentation

## 2016-11-14 LAB — CBC WITH DIFFERENTIAL/PLATELET
Basophils Absolute: 0 10*3/uL (ref 0–0.1)
Basophils Relative: 1 %
Eosinophils Absolute: 0.2 10*3/uL (ref 0–0.7)
Eosinophils Relative: 2 %
HCT: 29.3 % — ABNORMAL LOW (ref 40.0–52.0)
Hemoglobin: 10.3 g/dL — ABNORMAL LOW (ref 13.0–18.0)
Lymphocytes Relative: 23 %
Lymphs Abs: 1.6 10*3/uL (ref 1.0–3.6)
MCH: 38.5 pg — ABNORMAL HIGH (ref 26.0–34.0)
MCHC: 35.2 g/dL (ref 32.0–36.0)
MCV: 109.4 fL — ABNORMAL HIGH (ref 80.0–100.0)
Monocytes Absolute: 0.7 10*3/uL (ref 0.2–1.0)
Monocytes Relative: 10 %
Neutro Abs: 4.3 10*3/uL (ref 1.4–6.5)
Neutrophils Relative %: 64 %
Platelets: 145 10*3/uL — ABNORMAL LOW (ref 150–440)
RBC: 2.68 MIL/uL — ABNORMAL LOW (ref 4.40–5.90)
RDW: 13 % (ref 11.5–14.5)
WBC: 6.8 10*3/uL (ref 3.8–10.6)

## 2016-11-14 LAB — BASIC METABOLIC PANEL
Anion gap: 6 (ref 5–15)
BUN: 16 mg/dL (ref 6–20)
CO2: 25 mmol/L (ref 22–32)
Calcium: 9.1 mg/dL (ref 8.9–10.3)
Chloride: 108 mmol/L (ref 101–111)
Creatinine, Ser: 1.21 mg/dL (ref 0.61–1.24)
GFR calc Af Amer: >60 mL/min (ref >60)
GFR calc non Af Amer: 59 mL/min — ABNORMAL LOW (ref >60)
Glucose, Bld: 108 mg/dL — ABNORMAL HIGH (ref 65–99)
Potassium: 3.9 mmol/L (ref 3.5–5.1)
Sodium: 139 mmol/L (ref 135–145)

## 2016-11-14 LAB — MAGNESIUM: Magnesium: 1.7 mg/dL (ref 1.7–2.4)

## 2016-11-14 NOTE — Progress Notes (Signed)
No treatment needed today, per Dr. Mike Gip. Mag within normal limits. Logan Perez

## 2016-11-16 ENCOUNTER — Ambulatory Visit: Payer: Medicare Other | Admitting: Physical Therapy

## 2016-11-16 DIAGNOSIS — R262 Difficulty in walking, not elsewhere classified: Secondary | ICD-10-CM

## 2016-11-16 DIAGNOSIS — R2689 Other abnormalities of gait and mobility: Secondary | ICD-10-CM

## 2016-11-16 DIAGNOSIS — M6281 Muscle weakness (generalized): Secondary | ICD-10-CM | POA: Diagnosis not present

## 2016-11-16 DIAGNOSIS — R293 Abnormal posture: Secondary | ICD-10-CM

## 2016-11-16 NOTE — Therapy (Signed)
[place] [facility] [address] [place], [place], [address] Phone: [phone]   Fax:  [phone]  Physical Therapy Treatment  Patient Details  Name: Logan Perez MRN: [id number] Date of Birth: June 19, 1946 Referring Provider: Nolon Stalls, MD  Encounter Date: 11/16/2016      PT End of Session - 11/16/16 1050    Visit Number 8   Number of Visits 15   Date for PT Re-Evaluation 12/13/16   Authorization - Visit Number 8   Authorization - Number of Visits 17   PT Start Time 9924   PT Stop Time 0945   PT Time Calculation (min) 58 min   Equipment Utilized During Treatment Gait belt   Activity Tolerance Patient tolerated treatment well   Behavior During Therapy [facility] for tasks assessed/performed      Past Medical History:  Diagnosis Date  . Anxiety   . Atrial fibrillation ([place])   . BPH (benign prostatic hyperplasia)   . Complication of anesthesia    BAD HEADACHE NIGHT OF FIRST CATARACT  . Difficulty voiding   . Dysrhythmia    A FIB  . Elevated PSA   . GERD (gastroesophageal reflux disease)   . HLD (hyperlipidemia)   . HOH (hard of hearing)   . Hypertension   . Multiple myeloma ([place])   . Neuropathy ([place])   . Pain    BACK  . Palpitations   . Pneumonia   . Pulmonary embolism ([place])   . Stroke [facility])    TIA    Past Surgical History:  Procedure Laterality Date  . BACK SURGERY  1994  . CATARACT EXTRACTION W/PHACO Right 05/02/2016   Procedure: CATARACT EXTRACTION PHACO AND INTRAOCULAR LENS PLACEMENT (IOC);  Surgeon: Birder Robson, MD;  Location: [facility] ORS;  Service: Ophthalmology;  Laterality: Right;  [place] 1.06 AP% 20.6 CDE 13.70 FLUID PACK LOT # [id number] H  . CATARACT EXTRACTION W/PHACO Left 05/16/2016   Procedure: CATARACT EXTRACTION PHACO AND INTRAOCULAR LENS PLACEMENT (IOC);  Surgeon: Birder Robson, MD;  Location: [facility] ORS;  Service: Ophthalmology;  Laterality: Left;  [place] 01:43 AP% 19.8 CDE 20.45 FLUID PACK LOT  #[id number] H  . KNEE ARTHROSCOPY Left 1992  . LIMBAL STEM CELL TRANSPLANT    . PAIN PUMP IMPLANTATION  2012  . stem cell implant  2008   [facility]    There were no vitals filed for this visit.      Subjective Assessment - 11/16/16 0945    Subjective Patient has had a little back low back pain today and took an aleve this morning. Patient reports walking his dog about the length of two football fields multiple times.    Pertinent History Pt. has multiple myeloma (dx 2008.) Pt. was unable to walk for a while afterward but did PT for ~6 mo. after. Pt states cancer went into remission, no meds 2008-2011, cancer returned 2011/2012. Hx of 4 compression fx. Took chemo drug for 5 years which was beneficial, not currently taking any chemo. Sept 2017 pt had 2nd stem cell transplant, recently pt has returned to going out in public. Before stem cell transplant, pt reports he could walk a mile, but now only walks to mailbox and back since he has lost his endurance. Pt progressed well with home health PT going up and down stairs. Reports 3/10 low back pain and takes an Aleve in the a.m.  Pt has a pain pump for spine for daily use.    Patient Stated Goals Pt. would like to  return to a gym-based program to begin regaining general strength    Currently in Pain? Yes   Pain Score 2    Pain Location Back   Pain Orientation Lower   Pain Descriptors / Indicators Aching      There Ex: Obstacle Course 3x with seated rest breaks between trials: 6",12",6" up-down negotiation, dynamic balance throws to trampoline with weighted ball and tandem stance 10x each foot, weaving between 5 cones 2x. Occasional LOB with left leg forward in tandem stance and stepping up/down when fatigued. Treadmill 2.2-2.4 10 minutes Nautilus seated abduction 2x10 each side with 40lb. Cues for body mechanics. STS 2x10 no U E. Cues for upright posture when standing and for tempo.   Neuro Re-ed 3 cone tap (lateral and forward) with single UE assist  3x 10x each direction with ambulating between each set. Cues for upright posture Blue airex pad balance: feet together 1x30 sec, eyes closed 2x30 sec, marching with single UE support 2x30 sec. Challenged patient's balance.   Gait Trng:  Ambulating ~40 ft between interventions to determine intervention carryover. Good base of support, required occasional cues for upright posture.  Farmers carry 2x 40 ft with 3lb dumbbell each hand. Cues for upright posture        PT Education - 11/16/16 1050    Education provided Yes   Education Details educated on squat form to pick up dog feces when walking dog.    Person(s) Educated Patient   Methods Explanation;Demonstration   Comprehension Verbalized understanding;Returned demonstration             PT Long Term Goals - 11/16/16 1216      PT LONG TERM GOAL #1   Title Pt. will score a 56/56 on the Berg Balance Assessment in order to improve functional mobility and balance.    Baseline Ongoing, will test next session. Pt scored a 54/56 10/19/15.    Time 4   Period Weeks   Status On-going     PT LONG TERM GOAL #2   Title Pt will demonstrate a normalized gait pattern with a wider base of support in order to improve overall mobility.    Baseline Patient is ambulating with a functional base of support, improving his confidence in ambulating.    Time 4   Period Weeks   Status Achieved     PT LONG TERM GOAL #3   Title Pt will ambulate for 10 minutes safely without fatigue in order to increase endurance while in the community.    Baseline Patient ambulates 10 minutes safely without fatigue.    Time 4   Period Weeks   Status Achieved     PT LONG TERM GOAL #4   Title Pt. will participate in a gym-based exercise program with no limitations to promote independence.    Baseline Pt. would like to return to gym to resume exercising regularly   Time 4   Period Weeks   Status On-going     PT LONG TERM GOAL #5   Title Pt will ambulate for 20  minutes safely without fatigue in order to increase endurance while in the community and walking dog.    Baseline Patient can ambulate 10 minutes safetly without fatigue on 2/8   Time 4   Period Weeks   Status New     Additional Long Term Goals   Additional Long Term Goals Yes     PT LONG TERM GOAL #6   Title Patient will squat to pick up  object from floor and return to standing 10x without LOB to increase safety with activities of daily living.   Baseline Patient can squat to pick up object 6 inches from ground and return to standing 10x with 2 episodes of LOB on 12/05/22   Time 4   Period Weeks   Status New     PT LONG TERM GOAL #7   Title Patient will complete 10 step ups onto 6" step without UE assist or LOB to increase mobility in the community.    Baseline Patient has lateral LOB with step ups and occasionally requires UE assistance when fatigued.    Time 4   Period Weeks   Status New               Plan - 2016-12-05 1058    Clinical Impression Statement Patient presented to physical therapy session with improved ability to negotiate obstacles while ambulating. Patient continues to progress with ability to safely ascend descend varying levels of steps, weaving around obstacles and picking objects off the ground with less incidences of LOB.  Patient's dynamic balance continues to be challenged in tandem stance with left leg forward.  Frequent cueing for upright posture was given throughout session due to patients preference to drop head and forward trunk lean while ambulating.  Ambulation trials were performed between interventions demonstrating the carryover of tasks with improved gait mechanics. Patient continues to make good progress towards goals as his balance, strength, and capacity for functional activity improves.  The patient is improving in his ability perform higher lever balance tasks safetly and with less incidences of LOB to carryover to activities of daily life such as  walking his dog, picking up dog feces from ground, and stepping up/down from curbs. Patient will continue to benefit from skilled physical therapy to increase balance, decrease risk for falls, improve generalized strength, and increase capacity for functional activities.    Rehab Potential Good   Clinical Impairments Affecting Rehab Potential motivation, active lifestyle, family support.    PT Frequency 2x / week   PT Duration 4 weeks   PT Treatment/Interventions Gait training;Functional mobility training;Therapeutic exercise;Balance training;Neuromuscular re-education   PT Next Visit Plan BERG, LEFS   PT Home Exercise Plan already issued    Consulted and Agree with Plan of Care Patient      Patient will benefit from skilled therapeutic intervention in order to improve the following deficits and impairments:  Abnormal gait, Postural dysfunction, Decreased activity tolerance, Decreased endurance, Decreased strength, Decreased balance, Difficulty walking  Visit Diagnosis: Muscle weakness (generalized)  Difficulty in walking, not elsewhere classified  Imbalance  Abnormal posture       G-Codes - 05-Dec-2016 1809    Functional Assessment Tool Used Clinical impression/ muscle weakness/ Berg Balance Assessment   Functional Limitation Mobility: Walking and moving around   Mobility: Walking and Moving Around Current Status [phone]) At least 1 percent but less than 20 percent impaired, limited or restricted   Mobility: Walking and Moving Around Goal Status [phone]) 0 percent impaired, limited or restricted      Problem List Patient Active Problem List   Diagnosis Date Noted  . Multiple myeloma in remission ([place]) 09/19/2016  . Xerostomia 08/23/2016  . Moderate protein-calorie malnutrition ([place]) 08/20/2016  . Thrombocytopenia ([place]) 08/20/2016  . Anemia 08/20/2016  . Nausea without vomiting 08/20/2016  . Atrial fibrillation ([place]) 07/10/2016  . 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 H/O autologous stem cell transplant ([place]) 07/10/2016  .  Bone cancer ([place]) 06/15/2016  . Porokeratosis 04/06/2016  . Encounter for adjustment or management of infusion pump 03/23/2016  . Hypomagnesemia 12/01/2015  . Ejection fraction < 50% 11/30/2015  . Chronic pain 08/26/2015  . Presence of intrathecal pump 08/26/2015  . Long term current use of opiate analgesic 08/26/2015  . Long term prescription opiate use 08/26/2015  . Opiate use 08/26/2015  . Encounter for therapeutic drug level monitoring 08/26/2015  . Night muscle spasms 08/26/2015  . Muscle spasticity 08/26/2015  . Neuropathic pain 08/26/2015  . Neurogenic pain 08/26/2015  . Chronic low back pain 08/26/2015  . Lumbar spondylosis 08/26/2015  . Compression fracture of T12 vertebra (HCC) (70-75% magnitude) (with mild retropulsion) 08/26/2015  . Diffuse myofascial pain syndrome 08/26/2015  . Presence of implanted infusion pump (Medtronic, programmable, intrathecal pump) 08/26/2015  . Cancer associated pain 08/26/2015  . Encounter for interrogation of infusion pump 08/26/2015  . Chronic lumbar radicular pain 08/26/2015  . Opiate analgesic contract exists 08/26/2015  . Chronic pain syndrome 08/26/2015  . Chronic anticoagulation (Eliquis) 08/26/2015  . Clostridium difficile diarrhea 07/21/2015  . Diarrhea 07/19/2015  . Multiple myeloma ([place]) 05/04/2015  . BPH with obstruction/lower urinary tract symptoms 04/29/2015  . MI (mitral incompetence) 12/03/2014  . TI (tricuspid incompetence) 12/03/2014  . Paroxysmal atrial fibrillation ([place]) 12/03/2014  . Breathlessness on exertion 12/03/2014  . Essential (primary) hypertension 01/29/2014  . Acid reflux 01/29/2014  . Combined fat and carbohydrate induced hyperlipemia 01/29/2014  . Pulmonary embolism ([place]) 01/29/2014   Pura Spice, PT, DPT # [id number] Janna Arch, SPT 11/16/2016, 6:10 PM  [place] [facility] [address] [place], [place], [address] Phone: [phone]   Fax:  [phone]  Name: Travez Stancil MRN: [id number] Date of Birth: [birth year]

## 2016-11-21 ENCOUNTER — Encounter: Payer: Self-pay | Admitting: Physical Therapy

## 2016-11-21 ENCOUNTER — Inpatient Hospital Stay: Payer: Medicare Other

## 2016-11-21 ENCOUNTER — Ambulatory Visit: Payer: Medicare Other | Admitting: Physical Therapy

## 2016-11-21 DIAGNOSIS — C9002 Multiple myeloma in relapse: Secondary | ICD-10-CM

## 2016-11-21 DIAGNOSIS — R2689 Other abnormalities of gait and mobility: Secondary | ICD-10-CM

## 2016-11-21 DIAGNOSIS — M6281 Muscle weakness (generalized): Secondary | ICD-10-CM

## 2016-11-21 DIAGNOSIS — R293 Abnormal posture: Secondary | ICD-10-CM

## 2016-11-21 DIAGNOSIS — Z5112 Encounter for antineoplastic immunotherapy: Secondary | ICD-10-CM | POA: Diagnosis not present

## 2016-11-21 DIAGNOSIS — R262 Difficulty in walking, not elsewhere classified: Secondary | ICD-10-CM

## 2016-11-21 DIAGNOSIS — C9000 Multiple myeloma not having achieved remission: Secondary | ICD-10-CM

## 2016-11-21 DIAGNOSIS — C9001 Multiple myeloma in remission: Secondary | ICD-10-CM

## 2016-11-21 LAB — BASIC METABOLIC PANEL
Anion gap: 7 (ref 5–15)
BUN: 18 mg/dL (ref 6–20)
CO2: 25 mmol/L (ref 22–32)
Calcium: 9 mg/dL (ref 8.9–10.3)
Chloride: 105 mmol/L (ref 101–111)
Creatinine, Ser: 1.18 mg/dL (ref 0.61–1.24)
GFR calc Af Amer: >60 mL/min (ref >60)
GFR calc non Af Amer: >60 mL/min (ref >60)
Glucose, Bld: 113 mg/dL — ABNORMAL HIGH (ref 65–99)
Potassium: 3.6 mmol/L (ref 3.5–5.1)
Sodium: 137 mmol/L (ref 135–145)

## 2016-11-21 LAB — CBC WITH DIFFERENTIAL/PLATELET
Basophils Absolute: 0.1 10*3/uL (ref 0–0.1)
Basophils Relative: 1 %
Eosinophils Absolute: 0.2 10*3/uL (ref 0–0.7)
Eosinophils Relative: 2 %
HCT: 29.8 % — ABNORMAL LOW (ref 40.0–52.0)
Hemoglobin: 10.5 g/dL — ABNORMAL LOW (ref 13.0–18.0)
Lymphocytes Relative: 16 %
Lymphs Abs: 1.3 10*3/uL (ref 1.0–3.6)
MCH: 38.6 pg — ABNORMAL HIGH (ref 26.0–34.0)
MCHC: 35.3 g/dL (ref 32.0–36.0)
MCV: 109.6 fL — ABNORMAL HIGH (ref 80.0–100.0)
Monocytes Absolute: 0.5 10*3/uL (ref 0.2–1.0)
Monocytes Relative: 6 %
Neutro Abs: 6 10*3/uL (ref 1.4–6.5)
Neutrophils Relative %: 75 %
Platelets: 151 10*3/uL (ref 150–440)
RBC: 2.72 MIL/uL — ABNORMAL LOW (ref 4.40–5.90)
RDW: 12.7 % (ref 11.5–14.5)
WBC: 8.1 10*3/uL (ref 3.8–10.6)

## 2016-11-21 LAB — MAGNESIUM: Magnesium: 1.8 mg/dL (ref 1.7–2.4)

## 2016-11-21 NOTE — Therapy (Signed)
[facility] [address]. [place], [place], [address] Phone: [phone]   Fax:  [phone]  Physical Therapy Treatment  Patient Details  Name: Logan Perez MRN: [id number] Date of Birth: [birth year] Referring Provider: Nolon Stalls, MD  Encounter Date: 11/21/2016      PT End of Session - 11/21/16 0856    Visit Number 9   Number of Visits 15   Date for PT Re-Evaluation 12/13/16   Authorization - Visit Number 9   Authorization - Number of Visits 17   PT Start Time 0856   PT Stop Time 0941   PT Time Calculation (min) 45 min   Equipment Utilized During Treatment Gait belt   Activity Tolerance Patient tolerated treatment well   Behavior During Therapy [facility] for tasks assessed/performed      Past Medical History:  Diagnosis Date  . Anxiety   . Atrial fibrillation ([place])   . BPH (benign prostatic hyperplasia)   . Complication of anesthesia    BAD HEADACHE NIGHT OF FIRST CATARACT  . Difficulty voiding   . Dysrhythmia    A FIB  . Elevated PSA   . GERD (gastroesophageal reflux disease)   . HLD (hyperlipidemia)   . HOH (hard of hearing)   . Hypertension   . Multiple myeloma ([place])   . Neuropathy ([place])   . Pain    BACK  . Palpitations   . Pneumonia   . Pulmonary embolism ([place])   . Stroke [facility])    TIA    Past Surgical History:  Procedure Laterality Date  . BACK SURGERY  1994  . CATARACT EXTRACTION W/PHACO Right 05/02/2016   Procedure: CATARACT EXTRACTION PHACO AND INTRAOCULAR LENS PLACEMENT (IOC);  Surgeon: Birder Robson, MD;  Location: [facility] ORS;  Service: Ophthalmology;  Laterality: Right;  [place] 1.06 AP% 20.6 CDE 13.70 FLUID PACK LOT # [id number] H  . CATARACT EXTRACTION W/PHACO Left 05/16/2016   Procedure: CATARACT EXTRACTION PHACO AND INTRAOCULAR LENS PLACEMENT (IOC);  Surgeon: Birder Robson, MD;  Location: [facility] ORS;  Service: Ophthalmology;  Laterality: Left;  [place] 01:43 AP% 19.8 CDE 20.45 FLUID PACK LOT  #[id number] H  . KNEE ARTHROSCOPY Left 1992  . LIMBAL STEM CELL TRANSPLANT    . PAIN PUMP IMPLANTATION  2012  . stem cell implant  2008   [facility]    There were no vitals filed for this visit.      Subjective Assessment - 11/21/16 0856    Subjective Patient reports being compliant with HEP and has noticed an improvement in balance. Patient continues to walk dog frequently.    Pertinent History Pt. has multiple myeloma (dx 2008.) Pt. was unable to walk for a while afterward but did PT for ~6 mo. after. Pt states cancer went into remission, no meds 2008-2011, cancer returned 2011/2012. Hx of 4 compression fx. Took chemo drug for 5 years which was beneficial, not currently taking any chemo. Sept 2017 pt had 2nd stem cell transplant, recently pt has returned to going out in public. Before stem cell transplant, pt reports he could walk a mile, but now only walks to mailbox and back since he has lost his endurance. Pt progressed well with home health PT going up and down stairs. Reports 3/10 low back pain and takes an Aleve in the a.m.  Pt has a pain pump for spine for daily use.    Patient Stated Goals Pt. would like to return to a gym-based program to begin regaining general strength  Currently in Pain? No/denies      TherEx: Obstacle course in hall 2x: step over 3inch two in a row, step over 6" step, weave between 4 cone, toe taps 2 each cone, and back. One incidence of scissoring with toe tap. Required CGA throughout course. Squat and pick up ball from surface 3-5" from ground and return to stand 3x10. Cues for body mechanics. Walking forwards and backwards in // bars x4 times.  Squat side walks with BUE support in // bars x 4. Cues for squat body mechanics. Occasional side stepping.  Neuro Re-ed:  Airex pad: marches 2x20 with occasional Single UE support. Cues for upright posture Airex pad: Throwing ball at trampoline 2x20.  Ascending/Descending stairs with UE progression for improved  independence and less reliance on UE (two hands to single hand). Unable to descend without single hand assistance.   Ambulated between interventions to assess carryover    BERG: 55/56 LEFS: 59/80        PT Long Term Goals - 11/21/16 1058      PT LONG TERM GOAL #1   Title Pt. will score a 56/56 on the Berg Balance Assessment in order to improve functional mobility and balance.    Baseline Ongoing,  Pt scored a 54/56 10/19/15.  Pt scored a 55/56 on 2/13/`17.   Time 4   Period Weeks   Status On-going     PT LONG TERM GOAL #2   Title Pt will demonstrate a normalized gait pattern with a wider base of support in order to improve overall mobility.    Baseline Patient is ambulating with a functional base of support, improving his confidence in ambulating.    Time 4   Period Weeks   Status Achieved     PT LONG TERM GOAL #3   Title Pt will ambulate for 10 minutes safely without fatigue in order to increase endurance while in the community.    Baseline Patient ambulates 10 minutes safely without fatigue.    Time 4   Period Weeks   Status Achieved     PT LONG TERM GOAL #4   Title Pt. will participate in a gym-based exercise program with no limitations to promote independence.    Baseline Pt. would like to return to gym to resume exercising regularly   Time 4   Period Weeks   Status On-going     PT LONG TERM GOAL #5   Title Pt will ambulate for 20 minutes safely without fatigue in order to increase endurance while in the community and walking dog.    Baseline Patient can ambulate 10 minutes safetly without fatigue on 2/8   Time 4   Period Weeks   Status New     PT LONG TERM GOAL #6   Title Patient will squat to pick up object from floor and return to standing 10x without LOB to increase safety with activities of daily living.   Baseline Patient can squat to pick up object 6 inches from ground and return to standing 10x with 2 episodes of LOB on 2/8   Time 4   Period Weeks    Status New     PT LONG TERM GOAL #7   Title Patient will complete 10 step ups onto 6" step without UE assist or LOB to increase mobility in the community.    Baseline Patient has lateral LOB with step ups and occasionally requires UE assistance when fatigued.    Time 4   Period Weeks  Status New               Plan - 11/21/16 4193    Clinical Impression Statement Patient presented to physical therapy session with continued progress towards balance goals. Patient scored a 55/56 with tandem stance presenting as the only difficult task. The patient scored a percieved disability score of 59/80 on the LEFS. The patient exibited only one incidence of scissoring LOB during cone taps demonstrating and increased abiilty to perform dynamic balance activities. The patient would benefit from continued skilled physical therapy to increase balance, decrease risk for falls, improve generalized strength, and increase capacity for functional activities.    Rehab Potential Good   Clinical Impairments Affecting Rehab Potential motivation, active lifestyle, family support.    PT Frequency 2x / week   PT Duration 4 weeks   PT Treatment/Interventions Gait training;Functional mobility training;Therapeutic exercise;Balance training;Neuromuscular re-education   PT Next Visit Plan progress squat pick up to stand   PT Home Exercise Plan already issued    Consulted and Agree with Plan of Care Patient      Patient will benefit from skilled therapeutic intervention in order to improve the following deficits and impairments:  Abnormal gait, Postural dysfunction, Decreased activity tolerance, Decreased endurance, Decreased strength, Decreased balance, Difficulty walking  Visit Diagnosis: Muscle weakness (generalized)  Difficulty in walking, not elsewhere classified  Imbalance  Abnormal posture     Problem List Patient Active Problem List   Diagnosis Date Noted  . Multiple myeloma in remission ([place])  09/19/2016  . Xerostomia 08/23/2016  . Moderate protein-calorie malnutrition ([place]) 08/20/2016  . Thrombocytopenia ([place]) 08/20/2016  . Anemia 08/20/2016  . Nausea without vomiting 08/20/2016  . Atrial fibrillation ([place]) 07/10/2016  . Engraftment syndrome following stem cell transplantation ([place]) 07/10/2016  . H/O autologous stem cell transplant ([place]) 07/10/2016  . Bone cancer ([place]) 06/15/2016  . Porokeratosis 04/06/2016  . Encounter for adjustment or management of infusion pump 03/23/2016  . Hypomagnesemia 12/01/2015  . Ejection fraction < 50% 11/30/2015  . Chronic pain 08/26/2015  . Presence of intrathecal pump 08/26/2015  . Long term current use of opiate analgesic 08/26/2015  . Long term prescription opiate use 08/26/2015  . Opiate use 08/26/2015  . Encounter for therapeutic drug level monitoring 08/26/2015  . Night muscle spasms 08/26/2015  . Muscle spasticity 08/26/2015  . Neuropathic pain 08/26/2015  . Neurogenic pain 08/26/2015  . Chronic low back pain 08/26/2015  . Lumbar spondylosis 08/26/2015  . Compression fracture of T12 vertebra (HCC) (70-75% magnitude) (with mild retropulsion) 08/26/2015  . Diffuse myofascial pain syndrome 08/26/2015  . Presence of implanted infusion pump (Medtronic, programmable, intrathecal pump) 08/26/2015  . Cancer associated pain 08/26/2015  . Encounter for interrogation of infusion pump 08/26/2015  . Chronic lumbar radicular pain 08/26/2015  . Opiate analgesic contract exists 08/26/2015  . Chronic pain syndrome 08/26/2015  . Chronic anticoagulation (Eliquis) 08/26/2015  . Clostridium difficile diarrhea 07/21/2015  . Diarrhea 07/19/2015  . Multiple myeloma ([place]) 05/04/2015  . BPH with obstruction/lower urinary tract symptoms 04/29/2015  . MI (mitral incompetence) 12/03/2014  . TI (tricuspid incompetence) 12/03/2014  . Paroxysmal atrial fibrillation ([place]) 12/03/2014  . Breathlessness on exertion 12/03/2014  . Essential (primary)  hypertension 01/29/2014  . Acid reflux 01/29/2014  . Combined fat and carbohydrate induced hyperlipemia 01/29/2014  . Pulmonary embolism ([place]) 01/29/2014   Pura Spice, PT, DPT # [id number] Janna Arch, SPT 11/22/2016, 8:03 AM  [place]  [address] [place], [place], [address] Phone: [phone]   Fax:  [phone]  Name: Claiborne Stroble MRN: [id number] Date of Birth: 1945-12-31

## 2016-11-21 NOTE — Progress Notes (Signed)
No mag replacement needed today, magnesium 1.8, within normal limits.  LJ

## 2016-11-23 ENCOUNTER — Ambulatory Visit: Payer: Medicare Other | Admitting: Physical Therapy

## 2016-11-23 DIAGNOSIS — M6281 Muscle weakness (generalized): Secondary | ICD-10-CM | POA: Diagnosis not present

## 2016-11-23 DIAGNOSIS — R262 Difficulty in walking, not elsewhere classified: Secondary | ICD-10-CM

## 2016-11-23 DIAGNOSIS — R2689 Other abnormalities of gait and mobility: Secondary | ICD-10-CM

## 2016-11-23 DIAGNOSIS — R293 Abnormal posture: Secondary | ICD-10-CM

## 2016-11-23 NOTE — Therapy (Signed)
[facility] [address] [place], [place], [address] Phone: [phone]   Fax:  [phone]  Physical Therapy Treatment  Patient Details  Name: Ewel Lona MRN: [id number] Date of Birth: 12-22-1945 Referring Provider: Nolon Stalls, MD  Encounter Date: 11/23/2016      PT End of Session - 11/23/16 1106    Visit Number 10   Number of Visits 15   Date for PT Re-Evaluation 12/13/16   Authorization - Visit Number 10   Authorization - Number of Visits 17   PT Start Time 0939   PT Stop Time 1037   PT Time Calculation (min) 58 min   Equipment Utilized During Treatment Gait belt   Activity Tolerance Patient tolerated treatment well   Behavior During Therapy [facility] for tasks assessed/performed      Past Medical History:  Diagnosis Date  . Anxiety   . Atrial fibrillation ([place])   . BPH (benign prostatic hyperplasia)   . Complication of anesthesia    BAD HEADACHE NIGHT OF FIRST CATARACT  . Difficulty voiding   . Dysrhythmia    A FIB  . Elevated PSA   . GERD (gastroesophageal reflux disease)   . HLD (hyperlipidemia)   . HOH (hard of hearing)   . Hypertension   . Multiple myeloma ([place])   . Neuropathy ([place])   . Pain    BACK  . Palpitations   . Pneumonia   . Pulmonary embolism ([place])   . Stroke [facility])    TIA    Past Surgical History:  Procedure Laterality Date  . BACK SURGERY  1994  . CATARACT EXTRACTION W/PHACO Right 05/02/2016   Procedure: CATARACT EXTRACTION PHACO AND INTRAOCULAR LENS PLACEMENT (IOC);  Surgeon: Birder Robson, MD;  Location: [facility] ORS;  Service: Ophthalmology;  Laterality: Right;  [place] 1.06 AP% 20.6 CDE 13.70 FLUID PACK LOT # [id number] H  . CATARACT EXTRACTION W/PHACO Left 05/16/2016   Procedure: CATARACT EXTRACTION PHACO AND INTRAOCULAR LENS PLACEMENT (IOC);  Surgeon: Birder Robson, MD;  Location: [facility] ORS;  Service: Ophthalmology;  Laterality: Left;  [place] 01:43 AP% 19.8 CDE 20.45 FLUID PACK LOT  #[id number] H  . KNEE ARTHROSCOPY Left 1992  . LIMBAL STEM CELL TRANSPLANT    . PAIN PUMP IMPLANTATION  2012  . stem cell implant  2008   [facility]    There were no vitals filed for this visit.      Subjective Assessment - 11/23/16 1104    Subjective Patient was crawling on hands and knees in the attic this morning and now has 2/10 back pain. He took 2 advil prior to the session but is feeling a little fatigued.    Pertinent History Pt. has multiple myeloma (dx 2008.) Pt. was unable to walk for a while afterward but did PT for ~6 mo. after. Pt states cancer went into remission, no meds 2008-2011, cancer returned 2011/2012. Hx of 4 compression fx. Took chemo drug for 5 years which was beneficial, not currently taking any chemo. Sept 2017 pt had 2nd stem cell transplant, recently pt has returned to going out in public. Before stem cell transplant, pt reports he could walk a mile, but now only walks to mailbox and back since he has lost his endurance. Pt progressed well with home health PT going up and down stairs. Reports 3/10 low back pain and takes an Aleve in the a.m.  Pt has a pain pump for spine for daily use.    Patient Stated Goals Pt. would  like to return to a gym-based program to begin regaining general strength    Currently in Pain? Yes   Pain Score 2    Pain Location Back   Pain Orientation Lower   Pain Descriptors / Indicators Aching   Pain Onset Today      TherEx TG squats 1x20, Single leg squats 1x10 each leg, calf raises 20x. Cues for body mechanics. Tactile cueing for patellar linearity for single leg squats.  Walking lunges in // bars with BUE support 6x length of bars. Cues for body mechanics and depth of lunge Treadmill 2.2-2.4 for 10 minutes for improved capacity for ambulation. Scuffling more, more out of breathe.  TPartial squats 2x10. Educated on proper form.   Neuro Re-ed  Tandem line walking 6x~10 ft with CGA and occasional LOB. Continue working on.  Star toe tap : more  stable with right leg down than with left single leg.  Heel to toe rocking with BUE support 2x10.       Pt response for medical necessity: Pt. Will continue to benefit from skilled PT in order to keep progressing LE strengthening, balance training, and capacity for functional activities of daily life.           PT Education - 11/23/16 1105    Education provided Yes   Education Details squat form   Person(s) Educated Patient   Methods Explanation;Demonstration   Comprehension Verbalized understanding;Returned demonstration             PT Long Term Goals - 11/21/16 1058      PT LONG TERM GOAL #1   Title Pt. will score a 56/56 on the Berg Balance Assessment in order to improve functional mobility and balance.    Baseline Ongoing,  Pt scored a 54/56 10/19/15.  Pt scored a 55/56 on 2/13/`17.   Time 4   Period Weeks   Status On-going     PT LONG TERM GOAL #2   Title Pt will demonstrate a normalized gait pattern with a wider base of support in order to improve overall mobility.    Baseline Patient is ambulating with a functional base of support, improving his confidence in ambulating.    Time 4   Period Weeks   Status Achieved     PT LONG TERM GOAL #3   Title Pt will ambulate for 10 minutes safely without fatigue in order to increase endurance while in the community.    Baseline Patient ambulates 10 minutes safely without fatigue.    Time 4   Period Weeks   Status Achieved     PT LONG TERM GOAL #4   Title Pt. will participate in a gym-based exercise program with no limitations to promote independence.    Baseline Pt. would like to return to gym to resume exercising regularly   Time 4   Period Weeks   Status On-going     PT LONG TERM GOAL #5   Title Pt will ambulate for 20 minutes safely without fatigue in order to increase endurance while in the community and walking dog.    Baseline Patient can ambulate 10 minutes safetly without fatigue on 2/8   Time 4   Period  Weeks   Status New     PT LONG TERM GOAL #6   Title Patient will squat to pick up object from floor and return to standing 10x without LOB to increase safety with activities of daily living.   Baseline Patient can squat to pick up object  6 inches from ground and return to standing 10x with 2 episodes of LOB on 2/8   Time 4   Period Weeks   Status New     PT LONG TERM GOAL #7   Title Patient will complete 10 step ups onto 6" step without UE assist or LOB to increase mobility in the community.    Baseline Patient has lateral LOB with step ups and occasionally requires UE assistance when fatigued.    Time 4   Period Weeks   Status New            Plan - 11/23/16 1106    Clinical Impression Statement Patient continues to progress with balance and strengthening. Patient was more fatigued today due to morning's activities and required more frequent rest breaks.  Tandem line walking was challenging to patient but increased repetition resulted in better dynamic balance and posture. Patient was educated on proper squat form with support surface to increase LE strength to allow for better ambulation and community mobility. Patient's BLE weakness continues to be noticeable with external rotation compensation. Patient would benefit from continued skilled physical therapy for generalized strengthening, stability, and improved capacity to perform functional activities of daily living.    Rehab Potential Good   Clinical Impairments Affecting Rehab Potential motivation, active lifestyle, family support.    PT Frequency 2x / week   PT Duration 4 weeks   PT Treatment/Interventions Gait training;Functional mobility training;Therapeutic exercise;Balance training;Neuromuscular re-education   PT Next Visit Plan FLOOR TO STAND   PT Home Exercise Plan already issued    Consulted and Agree with Plan of Care Patient      Patient will benefit from skilled therapeutic intervention in order to improve the  following deficits and impairments:  Abnormal gait, Postural dysfunction, Decreased activity tolerance, Decreased endurance, Decreased strength, Decreased balance, Difficulty walking  Visit Diagnosis: Muscle weakness (generalized)  Difficulty in walking, not elsewhere classified  Imbalance  Abnormal posture     Problem List Patient Active Problem List   Diagnosis Date Noted  . Multiple myeloma in remission ([place]) 09/19/2016  . Xerostomia 08/23/2016  . Moderate protein-calorie malnutrition ([place]) 08/20/2016  . Thrombocytopenia ([place]) 08/20/2016  . Anemia 08/20/2016  . Nausea without vomiting 08/20/2016  . Atrial fibrillation ([place]) 07/10/2016  . 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 H/O autologous stem cell transplant ([place]) 07/10/2016  . Bone cancer ([place]) 06/15/2016  . Porokeratosis 04/06/2016  . Encounter for adjustment or management of infusion pump 03/23/2016  . Hypomagnesemia 12/01/2015  . Ejection fraction < 50% 11/30/2015  . Chronic pain 08/26/2015  . Presence of intrathecal pump 08/26/2015  . Long term current use of opiate analgesic 08/26/2015  . Long term prescription opiate use 08/26/2015  . Opiate use 08/26/2015  . Encounter for therapeutic drug level monitoring 08/26/2015  . Night muscle spasms 08/26/2015  . Muscle spasticity 08/26/2015  . Neuropathic pain 08/26/2015  . Neurogenic pain 08/26/2015  . Chronic low back pain 08/26/2015  . Lumbar spondylosis 08/26/2015  . Compression fracture of T12 vertebra (HCC) (70-75% magnitude) (with mild retropulsion) 08/26/2015  . Diffuse myofascial pain syndrome 08/26/2015  . Presence of implanted infusion pump (Medtronic, programmable, intrathecal pump) 08/26/2015  . Cancer associated pain 08/26/2015  . Encounter for interrogation of infusion pump 08/26/2015  . Chronic lumbar radicular pain 08/26/2015  . Opiate analgesic contract exists 08/26/2015  . Chronic pain syndrome 08/26/2015   . Chronic anticoagulation (Eliquis) 08/26/2015  . Clostridium difficile diarrhea 07/21/2015  .  Diarrhea 07/19/2015  . Multiple myeloma ([place]) 05/04/2015  . BPH with obstruction/lower urinary tract symptoms 04/29/2015  . MI (mitral incompetence) 12/03/2014  . TI (tricuspid incompetence) 12/03/2014  . Paroxysmal atrial fibrillation ([place]) 12/03/2014  . Breathlessness on exertion 12/03/2014  . Essential (primary) hypertension 01/29/2014  . Acid reflux 01/29/2014  . Combined fat and carbohydrate induced hyperlipemia 01/29/2014  . Pulmonary embolism ([place]) 01/29/2014   Pura Spice, PT, DPT # [id number] Janna Arch, SPT 11/24/2016, 10:51 AM  [place] [facility] [address] [place], [place], [address] Phone: [phone]   Fax:  [phone]  Name: Cullan Launer MRN: [id number] Date of Birth: 18-Jul-1946

## 2016-11-24 ENCOUNTER — Encounter: Payer: Self-pay | Admitting: Physical Therapy

## 2016-11-27 ENCOUNTER — Ambulatory Visit: Payer: Medicare Other | Admitting: Physical Therapy

## 2016-11-27 ENCOUNTER — Encounter: Payer: Self-pay | Admitting: Physical Therapy

## 2016-11-27 DIAGNOSIS — R2689 Other abnormalities of gait and mobility: Secondary | ICD-10-CM

## 2016-11-27 DIAGNOSIS — R293 Abnormal posture: Secondary | ICD-10-CM

## 2016-11-27 DIAGNOSIS — M6281 Muscle weakness (generalized): Secondary | ICD-10-CM | POA: Diagnosis not present

## 2016-11-27 DIAGNOSIS — R262 Difficulty in walking, not elsewhere classified: Secondary | ICD-10-CM

## 2016-11-27 NOTE — Therapy (Signed)
[place] [facility] [address] [place], [place], [address] Phone: [phone]   Fax:  [phone]  Physical Therapy Treatment  Patient Details  Name: Logan Perez MRN: [id number] Date of Birth: 03-01-1946 Referring Provider: Nolon Stalls, MD  Encounter Date: 11/27/2016      PT End of Session - 11/27/16 1218    Visit Number 11   Number of Visits 15   Date for PT Re-Evaluation 12/13/16   Authorization - Visit Number 11   Authorization - Number of Visits 17   PT Start Time 0947   PT Stop Time 1033   PT Time Calculation (min) 46 min   Equipment Utilized During Treatment Gait belt   Activity Tolerance Patient tolerated treatment well   Behavior During Therapy [facility] for tasks assessed/performed      Past Medical History:  Diagnosis Date  . Anxiety   . Atrial fibrillation ([place])   . BPH (benign prostatic hyperplasia)   . Complication of anesthesia    BAD HEADACHE NIGHT OF FIRST CATARACT  . Difficulty voiding   . Dysrhythmia    A FIB  . Elevated PSA   . GERD (gastroesophageal reflux disease)   . HLD (hyperlipidemia)   . HOH (hard of hearing)   . Hypertension   . Multiple myeloma ([place])   . Neuropathy ([place])   . Pain    BACK  . Palpitations   . Pneumonia   . Pulmonary embolism ([employer])   . Stroke [facility])    TIA    Past Surgical History:  Procedure Laterality Date  . BACK SURGERY  1994  . CATARACT EXTRACTION W/PHACO Right 05/02/2016   Procedure: CATARACT EXTRACTION PHACO AND INTRAOCULAR LENS PLACEMENT (IOC);  Surgeon: Birder Robson, MD;  Location: [facility] ORS;  Service: Ophthalmology;  Laterality: Right;  [place] 1.06 AP% 20.6 CDE 13.70 FLUID PACK LOT # [id number] H  . CATARACT EXTRACTION W/PHACO Left 05/16/2016   Procedure: CATARACT EXTRACTION PHACO AND INTRAOCULAR LENS PLACEMENT (IOC);  Surgeon: Birder Robson, MD;  Location: [facility] ORS;  Service: Ophthalmology;  Laterality: Left;  [place] 01:43 AP% 19.8 CDE 20.45 FLUID PACK LOT  #[id number] H  . KNEE ARTHROSCOPY Left 1992  . LIMBAL STEM CELL TRANSPLANT    . PAIN PUMP IMPLANTATION  2012  . stem cell implant  2008   [facility]    There were no vitals filed for this visit.      Subjective Assessment - 11/27/16 1215    Subjective Patient reports walking dog and having slight pain in low back potentially due to the weather. He reports taking advil for relief.    Pertinent History Pt. has multiple myeloma (dx 2008.) Pt. was unable to walk for a while afterward but did PT for ~6 mo. after. Pt states cancer went into remission, no meds 2008-2011, cancer returned 2011/2012. Hx of 4 compression fx. Took chemo drug for 5 years which was beneficial, not currently taking any chemo. Sept 2017 pt had 2nd stem cell transplant, recently pt has returned to going out in public. Before stem cell transplant, pt reports he could walk a mile, but now only walks to mailbox and back since he has lost his endurance. Pt progressed well with home health PT going up and down stairs. Reports 3/10 low back pain and takes an Aleve in the a.m.  Pt has a pain pump for spine for daily use.    Patient Stated Goals Pt. would like to return to a gym-based program to begin regaining  general strength    Currently in Pain? Yes   Pain Score 1    Pain Location Back   Pain Orientation Lower   Pain Descriptors / Indicators Aching   Pain Onset Today     TherAct: Floor recovery: Patient on hands and knees on mat next to support surface. Transition from hands and knees to half kneeling. Place ipsilateral hand on side of knee and opposite hand on top of the front knee. Push through knee to stand. Performed 5x each side for independence. Patient had more difficulty performing with left leg forward due to LLE weakness. Patient required frequent rest breaks between due to fatigue.  TherEx: Wall squats 1x10 TG squats 1x20, Single leg squats 1x10 each leg, calf raises 20x. Cues for body mechanics. Tactile cueing for patellar  linearity for single leg squats.  Walking partial lunges w/o UE support 2x10 ft. CGA to keep balance. Occasional loss of balance.  Treadmill 2.2-2.4 for 10 minutes for improved capacity for ambulation   Pt response for medical necessity: Pt. Will continue to benefit from skilled PT in order to keep progressing LE strengthening,balance training, and capacity for functional activities of daily life.         PT Education - 11/27/16 1217    Education provided Yes   Education Details Getting up from the ground via half kneeling position without use of furniture.    Person(s) Educated Patient   Methods Explanation;Demonstration   Comprehension Verbalized understanding;Returned demonstration             PT Long Term Goals - 11/21/16 1058      PT LONG TERM GOAL #1   Title Pt. will score a 56/56 on the Berg Balance Assessment in order to improve functional mobility and balance.    Baseline Ongoing,  Pt scored a 54/56 10/19/15.  Pt scored a 55/56 on 2/13/`17.   Time 4   Period Weeks   Status On-going     PT LONG TERM GOAL #2   Title Pt will demonstrate a normalized gait pattern with a wider base of support in order to improve overall mobility.    Baseline Patient is ambulating with a functional base of support, improving his confidence in ambulating.    Time 4   Period Weeks   Status Achieved     PT LONG TERM GOAL #3   Title Pt will ambulate for 10 minutes safely without fatigue in order to increase endurance while in the community.    Baseline Patient ambulates 10 minutes safely without fatigue.    Time 4   Period Weeks   Status Achieved     PT LONG TERM GOAL #4   Title Pt. will participate in a gym-based exercise program with no limitations to promote independence.    Baseline Pt. would like to return to gym to resume exercising regularly   Time 4   Period Weeks   Status On-going     PT LONG TERM GOAL #5   Title Pt will ambulate for 20 minutes safely without fatigue in  order to increase endurance while in the community and walking dog.    Baseline Patient can ambulate 10 minutes safetly without fatigue on 2/8   Time 4   Period Weeks   Status New     PT LONG TERM GOAL #6   Title Patient will squat to pick up object from floor and return to standing 10x without LOB to increase safety with activities of daily living.  Baseline Patient can squat to pick up object 6 inches from ground and return to standing 10x with 2 episodes of LOB on 2/8   Time 4   Period Weeks   Status New     PT LONG TERM GOAL #7   Title Patient will complete 10 step ups onto 6" step without UE assist or LOB to increase mobility in the community.    Baseline Patient has lateral LOB with step ups and occasionally requires UE assistance when fatigued.    Time 4   Period Weeks   Status New            Plan - 11/27/16 1218    Clinical Impression Statement Patient demonstrated improved independence in functional mobility today. Patient was educated on how to return to standing from being on hands and knees on the floor without using furniture. Patient demonstrated understanding of half kneeling arms pressing on front knee technique and was safe and independent with both extremities by end of session. Patient continues to require frequent seated rest breaks due to fatigue and will benefit from continued generalized strengthening and capacity endurance. Patient will benefit from continued skilled physical therapy for generalized strengthening, capacity for functional activities, balance, and community mobility.    Rehab Potential Good   Clinical Impairments Affecting Rehab Potential motivation, active lifestyle, family support.    PT Frequency 2x / week   PT Duration 4 weeks   PT Treatment/Interventions Gait training;Functional mobility training;Therapeutic exercise;Balance training;Neuromuscular re-education   PT Next Visit Plan FLOOR TO STAND   PT Home Exercise Plan already issued     Consulted and Agree with Plan of Care Patient      Patient will benefit from skilled therapeutic intervention in order to improve the following deficits and impairments:  Abnormal gait, Postural dysfunction, Decreased activity tolerance, Decreased endurance, Decreased strength, Decreased balance, Difficulty walking  Visit Diagnosis: Muscle weakness (generalized)  Difficulty in walking, not elsewhere classified  Imbalance  Abnormal posture     Problem List Patient Active Problem List   Diagnosis Date Noted  . Multiple myeloma in remission ([place]) 09/19/2016  . Xerostomia 08/23/2016  . Moderate protein-calorie malnutrition ([place]) 08/20/2016  . Thrombocytopenia ([place]) 08/20/2016  . Anemia 08/20/2016  . Nausea without vomiting 08/20/2016  . Atrial fibrillation ([place]) 07/10/2016  . 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 H/O autologous stem cell transplant ([place]) 07/10/2016  . Bone cancer ([place]) 06/15/2016  . Porokeratosis 04/06/2016  . Encounter for adjustment or management of infusion pump 03/23/2016  . Hypomagnesemia 12/01/2015  . Ejection fraction < 50% 11/30/2015  . Chronic pain 08/26/2015  . Presence of intrathecal pump 08/26/2015  . Long term current use of opiate analgesic 08/26/2015  . Long term prescription opiate use 08/26/2015  . Opiate use 08/26/2015  . Encounter for therapeutic drug level monitoring 08/26/2015  . Night muscle spasms 08/26/2015  . Muscle spasticity 08/26/2015  . Neuropathic pain 08/26/2015  . Neurogenic pain 08/26/2015  . Chronic low back pain 08/26/2015  . Lumbar spondylosis 08/26/2015  . Compression fracture of T12 vertebra (HCC) (70-75% magnitude) (with mild retropulsion) 08/26/2015  . Diffuse myofascial pain syndrome 08/26/2015  . Presence of implanted infusion pump (Medtronic, programmable, intrathecal pump) 08/26/2015  . Cancer associated pain 08/26/2015  . Encounter for interrogation of infusion pump  08/26/2015  . Chronic lumbar radicular pain 08/26/2015  . Opiate analgesic contract exists 08/26/2015  . Chronic pain syndrome 08/26/2015  . Chronic anticoagulation (Eliquis) 08/26/2015  .  Clostridium difficile diarrhea 07/21/2015  . Diarrhea 07/19/2015  . Multiple myeloma ([place]) 05/04/2015  . BPH with obstruction/lower urinary tract symptoms 04/29/2015  . MI (mitral incompetence) 12/03/2014  . TI (tricuspid incompetence) 12/03/2014  . Paroxysmal atrial fibrillation ([place]) 12/03/2014  . Breathlessness on exertion 12/03/2014  . Essential (primary) hypertension 01/29/2014  . Acid reflux 01/29/2014  . Combined fat and carbohydrate induced hyperlipemia 01/29/2014  . Pulmonary embolism ([place]) 01/29/2014   Pura Spice, PT, DPT # [id number] Janna Arch, SPT 11/27/2016, 1:21 PM  [place] [facility] [address] [place], [place], [address] Phone: [phone]   Fax:  [phone]  Name: Lamario Mani MRN: [id number] Date of Birth: [birth year]

## 2016-11-28 ENCOUNTER — Inpatient Hospital Stay (HOSPITAL_BASED_OUTPATIENT_CLINIC_OR_DEPARTMENT_OTHER): Payer: Medicare Other | Admitting: Hematology and Oncology

## 2016-11-28 ENCOUNTER — Inpatient Hospital Stay: Payer: Medicare Other

## 2016-11-28 ENCOUNTER — Other Ambulatory Visit: Payer: Self-pay

## 2016-11-28 ENCOUNTER — Encounter (INDEPENDENT_AMBULATORY_CARE_PROVIDER_SITE_OTHER): Payer: Self-pay

## 2016-11-28 ENCOUNTER — Telehealth: Payer: Self-pay

## 2016-11-28 VITALS — BP 133/72 | HR 82 | Temp 96.7°F | Resp 18 | Wt 149.3 lb

## 2016-11-28 DIAGNOSIS — D696 Thrombocytopenia, unspecified: Secondary | ICD-10-CM

## 2016-11-28 DIAGNOSIS — C9002 Multiple myeloma in relapse: Secondary | ICD-10-CM

## 2016-11-28 DIAGNOSIS — I493 Ventricular premature depolarization: Secondary | ICD-10-CM

## 2016-11-28 DIAGNOSIS — N400 Enlarged prostate without lower urinary tract symptoms: Secondary | ICD-10-CM

## 2016-11-28 DIAGNOSIS — K117 Disturbances of salivary secretion: Secondary | ICD-10-CM

## 2016-11-28 DIAGNOSIS — D638 Anemia in other chronic diseases classified elsewhere: Secondary | ICD-10-CM

## 2016-11-28 DIAGNOSIS — C9001 Multiple myeloma in remission: Secondary | ICD-10-CM

## 2016-11-28 DIAGNOSIS — Z9484 Stem cells transplant status: Secondary | ICD-10-CM | POA: Diagnosis not present

## 2016-11-28 DIAGNOSIS — E785 Hyperlipidemia, unspecified: Secondary | ICD-10-CM

## 2016-11-28 DIAGNOSIS — Z5112 Encounter for antineoplastic immunotherapy: Secondary | ICD-10-CM | POA: Diagnosis not present

## 2016-11-28 DIAGNOSIS — K219 Gastro-esophageal reflux disease without esophagitis: Secondary | ICD-10-CM | POA: Diagnosis not present

## 2016-11-28 DIAGNOSIS — I10 Essential (primary) hypertension: Secondary | ICD-10-CM | POA: Diagnosis not present

## 2016-11-28 DIAGNOSIS — I4891 Unspecified atrial fibrillation: Secondary | ICD-10-CM | POA: Diagnosis not present

## 2016-11-28 DIAGNOSIS — C9000 Multiple myeloma not having achieved remission: Secondary | ICD-10-CM

## 2016-11-28 DIAGNOSIS — Z79899 Other long term (current) drug therapy: Secondary | ICD-10-CM

## 2016-11-28 DIAGNOSIS — Z7901 Long term (current) use of anticoagulants: Secondary | ICD-10-CM

## 2016-11-28 LAB — MAGNESIUM: Magnesium: 1.9 mg/dL (ref 1.7–2.4)

## 2016-11-28 LAB — COMPREHENSIVE METABOLIC PANEL
ALT: 17 U/L (ref 17–63)
AST: 23 U/L (ref 15–41)
Albumin: 4.2 g/dL (ref 3.5–5.0)
Alkaline Phosphatase: 51 U/L (ref 38–126)
Anion gap: 5 (ref 5–15)
BUN: 20 mg/dL (ref 6–20)
CO2: 27 mmol/L (ref 22–32)
Calcium: 9.2 mg/dL (ref 8.9–10.3)
Chloride: 109 mmol/L (ref 101–111)
Creatinine, Ser: 1.35 mg/dL — ABNORMAL HIGH (ref 0.61–1.24)
GFR calc Af Amer: 60 mL/min — ABNORMAL LOW (ref >60)
GFR calc non Af Amer: 52 mL/min — ABNORMAL LOW (ref >60)
Glucose, Bld: 91 mg/dL (ref 65–99)
Potassium: 3.9 mmol/L (ref 3.5–5.1)
Sodium: 141 mmol/L (ref 135–145)
Total Bilirubin: 0.6 mg/dL (ref 0.3–1.2)
Total Protein: 6.4 g/dL — ABNORMAL LOW (ref 6.5–8.1)

## 2016-11-28 LAB — CBC WITH DIFFERENTIAL/PLATELET
Basophils Absolute: 0 10*3/uL (ref 0–0.1)
Basophils Relative: 1 %
Eosinophils Absolute: 0.2 10*3/uL (ref 0–0.7)
Eosinophils Relative: 3 %
HCT: 31.6 % — ABNORMAL LOW (ref 40.0–52.0)
Hemoglobin: 11.2 g/dL — ABNORMAL LOW (ref 13.0–18.0)
Lymphocytes Relative: 24 %
Lymphs Abs: 1.6 10*3/uL (ref 1.0–3.6)
MCH: 38.7 pg — ABNORMAL HIGH (ref 26.0–34.0)
MCHC: 35.6 g/dL (ref 32.0–36.0)
MCV: 108.9 fL — ABNORMAL HIGH (ref 80.0–100.0)
Monocytes Absolute: 0.7 10*3/uL (ref 0.2–1.0)
Monocytes Relative: 11 %
Neutro Abs: 4.1 10*3/uL (ref 1.4–6.5)
Neutrophils Relative %: 61 %
Platelets: 149 10*3/uL — ABNORMAL LOW (ref 150–440)
RBC: 2.9 MIL/uL — ABNORMAL LOW (ref 4.40–5.90)
RDW: 12.6 % (ref 11.5–14.5)
WBC: 6.6 10*3/uL (ref 3.8–10.6)

## 2016-11-28 MED ORDER — PAIN MANAGEMENT IT PUMP REFILL: 1.0000 | Freq: Once | INTRATHECAL | 0 refills | Status: DC

## 2016-11-28 MED ORDER — APIXABAN 5 MG PO TABS: ORAL_TABLET | ORAL | 0 refills | Status: DC

## 2016-11-28 NOTE — Progress Notes (Signed)
[facility] day:  11/28/16  Chief Complaint: Jackston Oaxaca is a 71 y.o. 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  HPI: The patient was last seen in the medical oncology clinic on 10/31/2016.  At that time, he was seen for assessment on day 133 s/p autologous stem cell transplant.  Recommendations were for maintenance Velcade or carfilzomib (Kyprolis) per his transplant physician.  Prior to transplant, we had discussed maintenanace daratumumab (Darzalex).  He has not required magnesium since 10/24/2016.  During the interim, he has been eating well.  His weight gain has been poor.  He is drinking Ensure.  He goes to physical therapy every Monday and Thursday.   Past Medical History:  Diagnosis Date  . Anxiety   . Atrial fibrillation ([place])   . BPH (benign prostatic hyperplasia)   . Complication of anesthesia    BAD HEADACHE NIGHT OF FIRST CATARACT  . Difficulty voiding   . Dysrhythmia    A FIB  . Elevated PSA   . GERD (gastroesophageal reflux disease)   . HLD (hyperlipidemia)   . HOH (hard of hearing)   . Hypertension   . Multiple myeloma ([place])   . Neuropathy ([place])   . Pain    BACK  . Palpitations   . Pneumonia   . Pulmonary embolism ([place])   . Stroke [facility])    TIA    Past Surgical History:  Procedure Laterality Date  . BACK SURGERY  1994  . CATARACT EXTRACTION W/PHACO Right 05/02/2016   Procedure: CATARACT EXTRACTION PHACO AND INTRAOCULAR LENS PLACEMENT (IOC);  Surgeon: Birder Robson, MD;  Location: [facility] ORS;  Service: Ophthalmology;  Laterality: Right;  [place] 1.06 AP% 20.6 CDE 13.70 FLUID PACK LOT # [id number]  . CATARACT EXTRACTION W/PHACO Left 05/16/2016   Procedure: CATARACT EXTRACTION PHACO AND INTRAOCULAR LENS PLACEMENT (IOC);  Surgeon: Birder Robson, MD;  Location: [facility] ORS;  Service:  Ophthalmology;  Laterality: Left;  [place] 01:43 AP% 19.8 CDE 20.45 FLUID PACK LOT #[id number] H  . KNEE ARTHROSCOPY Left 1992  . LIMBAL STEM CELL TRANSPLANT    . PAIN PUMP IMPLANTATION  2012  . stem cell implant  2008   [facility]    Family History  Problem Relation Age of Onset  . Cancer Father     throat  . Kidney disease Sister   . Stroke    . Bladder Cancer Neg Hx   . Prostate cancer Neg Hx     Social History:  reports that he has quit smoking. His smoking use included Cigarettes. He has a 30.00 pack-year smoking history. He has quit using smokeless tobacco. He reports that he does not drink alcohol. His drug history is not on file.  He has a wire haired dachshund.  He lives in [place].  His wife's name is [name].  The patient is alone today.  Allergies:  Allergies  Allergen Reactions  . Azithromycin Diarrhea    Possible cause of C. Diff Possible cause of C. Diff Possible cause of C. Diff Possible cause of C. Diff Possible cause of C. Diff  . 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 Rivaroxaban Rash    Current Medications: Current Outpatient Prescriptions  Medication Sig  Dispense Refill  . acetaminophen (TYLENOL) 500 MG tablet Take 1,000 mg by mouth.    . ALPRAZolam (XANAX) 0.25 MG tablet TAKE 1 TABLET BY MOUTH EVERY NIGHT AT BEDTIME AS NEEDED FOR ANXIETY. 30 tablet 3  . apixaban (ELIQUIS) 5 MG TABS tablet TAKE 1 TABLET(5 MG) BY MOUTH TWICE DAILY 60 tablet 0  . baclofen (LIORESAL) 10 MG tablet Take 1 tablet (10 mg total) by mouth at bedtime. 30 each 3  . calcium citrate-vitamin D (CITRACAL+D) 315-200 MG-UNIT per tablet Take 1 tablet by mouth daily.     . cetirizine (ZYRTEC) 10 MG tablet Take 10 mg by mouth daily.     . Cholecalciferol (VITAMIN D3) 2000 UNITS capsule Take 2,000 Units by mouth daily.     .  Cyanocobalamin (RA VITAMIN B-12 TR) 1000 MCG TBCR Take 1,000 mcg by mouth daily.     [name] diltiazem (CARDIZEM) 60 MG tablet Take 60 mg by mouth as needed.    . diltiazem (DILACOR XR) 120 MG 24 hr capsule Take 120 mg by mouth daily.    . fluticasone (FLONASE) 50 MCG/ACT nasal spray Place 1 spray into the nose daily as needed for allergies.     . Iron-Vitamins (GERITOL PO) Take by mouth.    . loperamide (IMODIUM) 2 MG capsule Take 2 mg by mouth as needed for diarrhea or loose stools. Reported on 04/26/2016    . lovastatin (MEVACOR) 20 MG tablet     . magnesium oxide (MAG-OX) 400 (241.3 Mg) MG tablet Take 1 tablet (400 mg total) by mouth daily. 30 tablet 0  . montelukast (SINGULAIR) 10 MG tablet TAKE 1 TABLET BY MOUTH ONCE A DAY AS DIRECTED. 30 tablet 0  . omeprazole (PRILOSEC) 20 MG capsule Take 20 mg by mouth daily.     . potassium chloride SA (K-DUR,KLOR-CON) 20 MEQ tablet TAKE 1 TABLET(20 MEQ) BY MOUTH TWICE DAILY 180 tablet 0  . prochlorperazine (COMPAZINE) 10 MG tablet Take 10 mg by mouth every 6 (six) hours as needed for nausea or vomiting.    . ranitidine (ZANTAC) 150 MG tablet Take 150 mg by mouth daily.     . tamsulosin (FLOMAX) 0.4 MG CAPS capsule Take 1 capsule (0.4 mg total) by mouth daily. 90 capsule 4  . valACYclovir (VALTREX) 500 MG tablet TAKE 1 TABLET BY MOUTH DAILY 30 tablet 0  . PAIN MANAGEMENT IT PUMP REFILL 1 each by Intrathecal route once. Medication: PF Fentanyl 1,500.0 mcg/ml PF Bupivicaine 30.0 mg/ml PF Clonidine 300.71mg/ml Total Volume: 40 ml Needed by 01-02-17 @ 1000 1 each 0   No current facility-administered medications for this visit.     Review of Systems:  GENERAL: Feels "good".  Energy level improving.  No fevers or sweats.  Weight up 2 pounds. PERFORMANCE STATUS (ECOG): 1 HEENT:  Cataracts s/p surgery.  Dry mouth.  Taste sensation altered.  No sore throat, mouth sores or tenderness. Lungs:  No shortness of breath or cough.  No hemoptysis. Cardiac:  No chest  pain, palpitations, orthopnea, or PND. GI:  Appetite good.  Nausea managed with Compazine.  Loose stool (chronic).  No vomiting, constipation, melena or hematochezia. GU:  No urgency, frequency, dysuria, or hematuria. Musculoskeletal:  Compression fracture of back on a pain pump. No muscle tenderness. Extremities:  No pain or swelling. Skin:  Fragile skin.  No rashes or skin changes. Neuro:  Little tremor (chronic).  Neuropathy in feet.  No headache, weakness, balance or coordination issues. Endocrine:  No diabetes, thyroid issues,  hot flashes or night sweats. Psych:  No mood changes, depression or anxiety. Pain:  Chronic back pain (pain well managed with pump). Review of systems:  All other systems reviewed and found to be negative.   Physical Exam: Blood pressure 133/72, pulse 82, temperature (!) 96.7 F (35.9 C), temperature source Tympanic, resp. rate 18, weight 149 lb 4 oz (67.7 kg). GENERAL:  Thin elderly gentleman sitting comfortably in the exam room in no acute distress.  MENTAL STATUS:  Alert and oriented to person, place and time. HEAD:  Thin short gray hair.  Graying goateeTemporal wasting.  Normocephalic, atraumatic, face symmetric, no Cushingoid features. EYES:  Glasses.  Blue eyes s/p cataract surgery.  Pupils equal round and reactive to light and accomodation.  No conjunctivitis or scleral icterus. ENT:  Hearing aide.  Oropharynx clear without lesion.  Hearing aide.  Tongue normal. Mucous membranes moist.  RESPIRATORY:  Clear to auscultation without rales, wheezes or rhonchi. CARDIOVASCULAR:  Regular rate and rhythm without murmur, rub or gallop. ABDOMEN:  RUQ pain pump.  Soft, non-tender, with active bowel sounds, and no hepatosplenomegaly.  No masses. SKIN:  No rashes, ulcers or lesions. EXTREMITIES: Trace ankle edema.  No skin discoloration or tenderness.  No palpable cords. LYMPH NODES: No palpable cervical, supraclavicular, axillary or inguinal adenopathy  NEUROLOGICAL:  Intention tremor. PSYCH:  Appropriate.    Appointment on 11/28/2016  Component Date Value Ref Range Status  . WBC 11/28/2016 6.6  3.8 - 10.6 K/uL Final  . RBC 11/28/2016 2.90* 4.40 - 5.90 MIL/uL Final  . Hemoglobin 11/28/2016 11.2* 13.0 - 18.0 g/dL Final  . HCT 11/28/2016 31.6* 40.0 - 52.0 % Final  . MCV 11/28/2016 108.9* 80.0 - 100.0 fL Final  . MCH 11/28/2016 38.7* 26.0 - 34.0 pg Final  . MCHC 11/28/2016 35.6  32.0 - 36.0 g/dL Final  . RDW 11/28/2016 12.6  11.5 - 14.5 % Final  . Platelets 11/28/2016 149* 150 - 440 K/uL Final  . Neutrophils Relative % 11/28/2016 61  % Final  . Neutro Abs 11/28/2016 4.1  1.4 - 6.5 K/uL Final  . Lymphocytes Relative 11/28/2016 24  % Final  . Lymphs Abs 11/28/2016 1.6  1.0 - 3.6 K/uL Final  . Monocytes Relative 11/28/2016 11  % Final  . Monocytes Absolute 11/28/2016 0.7  0.2 - 1.0 K/uL Final  . Eosinophils Relative 11/28/2016 3  % Final  . Eosinophils Absolute 11/28/2016 0.2  0 - 0.7 K/uL Final  . Basophils Relative 11/28/2016 1  % Final  . Basophils Absolute 11/28/2016 0.0  0 - 0.1 K/uL Final  . Sodium 11/28/2016 141  135 - 145 mmol/L Final  . Potassium 11/28/2016 3.9  3.5 - 5.1 mmol/L Final  . Chloride 11/28/2016 109  101 - 111 mmol/L Final  . CO2 11/28/2016 27  22 - 32 mmol/L Final  . Glucose, Bld 11/28/2016 91  65 - 99 mg/dL Final  . BUN 11/28/2016 20  6 - 20 mg/dL Final  . Creatinine, Ser 11/28/2016 1.35* 0.61 - 1.24 mg/dL Final  . Calcium 11/28/2016 9.2  8.9 - 10.3 mg/dL Final  . Total Protein 11/28/2016 6.4* 6.5 - 8.1 g/dL Final  . Albumin 11/28/2016 4.2  3.5 - 5.0 g/dL Final  . AST 11/28/2016 23  15 - 41 U/L Final  . ALT 11/28/2016 17  17 - 63 U/L Final  . Alkaline Phosphatase 11/28/2016 51  38 - 126 U/L Final  . Total Bilirubin 11/28/2016 0.6  0.3 - 1.2 mg/dL Final  .  GFR calc non Af Amer 11/28/2016 52* >60 mL/min Final  . GFR calc Af Amer 11/28/2016 60* >60 mL/min Final   Comment: (NOTE) The eGFR has been calculated using the CKD EPI  equation. This calculation has not been validated in all clinical situations. eGFR's persistently <60 mL/min signify possible Chronic Kidney Disease.   . Anion gap 11/28/2016 5  5 - 15 Final  . Magnesium 11/28/2016 1.9  1.7 - 2.4 mg/dL Final    Assessment:  Ashrith Sagan is a 71 y.o. male with stage III mutiple myeloma.  He initially presented with progressive back pain beginning in 12/2006.  MRI revealed "spots and compression fractures".  He began Velcade, thalidomide, and Decadron.  In 08/2007, he underwent high dose chemotherapy and autologous stem cell transplant.  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  He recurred with a rising M-spike (2.7) with repeat M spike (1.7 gm/dl) in 03/2010.  He was initially treated with Velcade (02/08/2010 - 05/10/2010).  He then began Revlimid (15 mg 3 weeks on/1 week off) and Decadron (40 mg on day 1, 8, 15, 22).  Because of significant side effect with Decadron his dose was decreased to 10 mg once a week in 07/2010.    He was on maintenance Revlimid. Revlimid was initially10 mg 3 weeks on/1 week off. This was changed to 10 mg 2 weeks on/2 weeks off secondary to right nipple tenderness. His dose was increased to 10 mg 3 weeks on/1 week off with Decadron 10 mg a week (on Sundays) and then Revlamid 15 mg 3 weeks on and 1 week off with Decadron on Sundays.  He began Pomalyst 4 mg 3 weeks on/1 week off with Decadron on 08/27/2015.  Over the past year his SPEP has revealed no monoclonal protein (04/21/2015) and 0.5 gm/dL on 09/22/2015 and 10/20/2015. M spike was 0.1 on 02/02/2016, 03/01/2016, 03/29/2016, 04/26/2016, and 05/24/2016.  M spike was 0 on 10/11/2016 and 11/28/2016.  Free light chains have been monitored. Kappa free light chains were 18.54 on 11/21/2013, 18.37 on 02/20/2014, 18.93 on 05/22/2014, 32.58 (high; normal ratio 1.27) on 08/21/2014, 51.53 (high; elevated ratio of 2.12) on 11/13/2014, 28.08 (ratio 1.73) on  12/09/2014, 23.71 (ratio 2.17) on 01/18/2015, 92.93 (ratio 9.49) on 04/21/2015, 93.44 (ratio 10.28) on 05/26/2015, 255.45 (ratio of 24.05) on 07/14/2015, 373.89 (ratio 48.31) on 08/04/2015, 474.33 (ratio 70.58) on 08/25/2015, 450.76 (ratio 55.44) on 09/22/2015, 453.4 (ratio 59.89) on 10/20/2015, 58.26 (ratio of > 40.74) on 02/02/2016, 62.67 (ratio of 37.98) on 03/01/2016, 75.7 (ratio > 50.47) on 03/29/2016, 79.7 (ratio 53.13) on 04/26/2016, 108.6 (ratio > 72.4) on 05/24/2016, and 8.3 (1.46 ratio) on 10/11/2016.  Bone survey on 12/08/2014 was stable.  Bone survey on 10/21/2015 revealed increase conspicuity of subcentimeter lytic lesions in the calvarium.  Bone marrow aspirate and biopsy on 11/04/2015 revealed an atypical monoclonal plasma cells estimated at 30-40% of marrow cells.   Marrow was variably cellular (approximately 45%) with background trilineage hematopoiesis. There was no significant increase in marrow reticulin fibers. Storage iron was present.    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 He develoed herpes zoster in 04/2008. He developed a pulmonary embolism in 05/2013. He was initially on Xarelto, but is now on Eliquis. He had an episode of pneumonia around this time requiring a brief admission. He developed severe lower leg cramps on 08/07/2014 secondary to hypokalemia. Duplex was negative.   He was treated for C difficile colitis (Flagyl  completed 07/30/2015).  He has a chronic indwelling pain pump.  He received 4 cycles of Pomalyst and Decadron (08/27/2015 - 11/19/2015).  Restaging studies document progressive disease.  Kappa free light chains are increasing.  SPEP revealed 0.5 gm/dL monoclonal protein then 1.3 gm/dL.  Bone survey reveals increase conspicuity of subcentimeter lytic lesions in the calvarium.  Bone marrow reveals 30-40% plasma cells.   MUGA on 11/30/2015 revealed an ejection fraction of 46%.  He is felt not to  be a good candidate for Kyprolis.  There were no focal wall motion abnormalities.  He had a stress echo less than 1 year ago.  He has a history of PVCs and atrial fibrillation.  He takes Cardizem prn.  He is s/p 17 weeks of daratumumab (Darzalex) (12/09/2015 - 05/25/2016).  He tolerated treatment well without side effect.  Bone marrow on 02/09/2016 revealed no diagnostic morphologic evidence of plasma cell myeloma.  Marrow was normocellular to hypocellular marrow for age (ranging from 10-40%) with maturing trilineage hematopoiesis and mild multilineage dyspoiesis.  There was patchy mild increase in reticulin.  Storage iron was present.  Flow cytometry revealed no definitive evidence of monoclonality.  There was a non-specific atypical myeloid and monocytic findings with no increase in blasts.  Cytogenetics were normal (46, XY).  He is currently day 161 s/p 2nd autologous stem cell transplant on 06/16/2016.  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  He also experienced atrial fibrillation with intermittent episodes of RVR requiring IV beta blockers.  He is on prophylactic valacyclovir for 1 year post transplant.  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  Work-up on 08/23/2016 revealed the following normal studies:  B12 and folate.  Ferritin was 1379 (high).  Iron studies included a saturation of 56% and TIBC 141 (low).  ESR was 64.  Thrombocytopenia is improving slowly post transplant.  Platelet count is 149,000.  He has persistent hypomagnesemia and receives IV magnesium (2-4 gm) weekly.  He has not required magnesium since 10/24/2016.  Symptomatically, he feels good.  He has xerostomia.  He is slowly gaining weight.  He continues physical therapy.  Counts continue to improve slowly.  Creatinine is 1.35.  Magnesium is 1.9.  Plan:  1.  Labs today:  CBC with diff, CMP, Mg, SPEP. 2.  No IVF or magnesium needed today. 3.   Discuss port-a-cath placement.  Patient in agreement. 4.  Discuss chemotherapy plan (daratumumab-Darzalex 16 mg/kg monthly). 5.  Discuss potential allergic reaction to dartumumab.  Review Singular day before, day of, and 2 days after.  Discuss Decadron 16 mg at least 1 hour prior to treatment and 4 mg on day 2 and 3.  Premedications:  Ondansetron 8 mg IV, Benadryl 50 mg po, and Tylenol 650 mg po. 6.  Discuss patient's concern about Decadron and mood changes. 7.  Schedule port placement. 8.  Confirm preauth for daratumumab. 9.  RTC in 1 week for MD assessment, labs (CBC with diff, CMP, Mg), and cycle #1 daratumumab 16 mg/kg IV today.   Lequita Asal, MD  11/28/2016, 9:29 AM

## 2016-11-28 NOTE — Progress Notes (Signed)
Patient requesting refill for Eliquis.  Patient having PT twice a week.

## 2016-11-30 ENCOUNTER — Ambulatory Visit: Payer: Medicare Other | Admitting: Physical Therapy

## 2016-11-30 ENCOUNTER — Other Ambulatory Visit (INDEPENDENT_AMBULATORY_CARE_PROVIDER_SITE_OTHER): Payer: Self-pay

## 2016-11-30 DIAGNOSIS — R262 Difficulty in walking, not elsewhere classified: Secondary | ICD-10-CM

## 2016-11-30 DIAGNOSIS — R293 Abnormal posture: Secondary | ICD-10-CM

## 2016-11-30 DIAGNOSIS — M6281 Muscle weakness (generalized): Secondary | ICD-10-CM | POA: Diagnosis not present

## 2016-11-30 DIAGNOSIS — R2689 Other abnormalities of gait and mobility: Secondary | ICD-10-CM

## 2016-11-30 LAB — PROTEIN ELECTROPHORESIS, SERUM
A/G Ratio: 2.1 — ABNORMAL HIGH (ref 0.7–1.7)
Albumin ELP: 3.9 g/dL (ref 2.9–4.4)
Alpha-1-Globulin: 0.2 g/dL (ref 0.0–0.4)
Alpha-2-Globulin: 0.6 g/dL (ref 0.4–1.0)
Beta Globulin: 0.8 g/dL (ref 0.7–1.3)
Gamma Globulin: 0.4 g/dL (ref 0.4–1.8)
Globulin, Total: 1.9 — ABNORMAL LOW (ref 2.2–3.9)
Total Protein ELP: 5.8 g/dL — ABNORMAL LOW (ref 6.0–8.5)

## 2016-12-01 ENCOUNTER — Other Ambulatory Visit (INDEPENDENT_AMBULATORY_CARE_PROVIDER_SITE_OTHER): Payer: Self-pay

## 2016-12-01 ENCOUNTER — Telehealth: Payer: Self-pay | Admitting: *Deleted

## 2016-12-01 MED ORDER — SODIUM CHLORIDE 0.9 % IV SOLN: Freq: Once | INTRAVENOUS | Status: DC

## 2016-12-01 MED ORDER — DEXAMETHASONE 4 MG PO TABS: ORAL_TABLET | ORAL | 1 refills | Status: DC

## 2016-12-01 MED ORDER — SODIUM CHLORIDE 0.9 % IR SOLN: Freq: Once | Status: DC

## 2016-12-01 NOTE — Telephone Encounter (Signed)
  Please refill.  M  

## 2016-12-01 NOTE — Telephone Encounter (Signed)
Pt needing refill on dexamethasone prior to infusion on Tuesday. Pt states has plenty of singulair tablets. Refill of dex has been escribed per MD note.

## 2016-12-01 NOTE — Therapy (Signed)
[facility] [address] [place], [place], [address] Phone: [phone]   Fax:  [phone]  Physical Therapy Treatment  Patient Details  Name: Logan Perez MRN: [id number] Date of Birth: 08-12-1946 Referring Provider: Nolon Stalls, MD  Encounter Date: 11/30/2016      PT End of Session - 11/30/16 1447    Visit Number 12   Number of Visits 15   Date for PT Re-Evaluation 12/13/16   Authorization - Visit Number 12   Authorization - Number of Visits 17   PT Start Time 0943   PT Stop Time 1033   PT Time Calculation (min) 50 min   Equipment Utilized During Treatment Gait belt   Activity Tolerance Patient tolerated treatment well   Behavior During Therapy [facility] for tasks assessed/performed      Past Medical History:  Diagnosis Date  . Anxiety   . Atrial fibrillation ([place])   . BPH (benign prostatic hyperplasia)   . Complication of anesthesia    BAD HEADACHE NIGHT OF FIRST CATARACT  . Difficulty voiding   . Dysrhythmia    A FIB  . Elevated PSA   . GERD (gastroesophageal reflux disease)   . HLD (hyperlipidemia)   . HOH (hard of hearing)   . Hypertension   . Multiple myeloma ([place])   . Neuropathy ([place])   . Pain    BACK  . Palpitations   . Pneumonia   . Pulmonary embolism ([place])   . Stroke [facility])    TIA    Past Surgical History:  Procedure Laterality Date  . BACK SURGERY  1994  . CATARACT EXTRACTION W/PHACO Right 05/02/2016   Procedure: CATARACT EXTRACTION PHACO AND INTRAOCULAR LENS PLACEMENT (IOC);  Surgeon: Birder Robson, MD;  Location: [facility] ORS;  Service: Ophthalmology;  Laterality: Right;  [place] 1.06 AP% 20.6 CDE 13.70 FLUID PACK LOT # [id number] H  . CATARACT EXTRACTION W/PHACO Left 05/16/2016   Procedure: CATARACT EXTRACTION PHACO AND INTRAOCULAR LENS PLACEMENT (IOC);  Surgeon: Birder Robson, MD;  Location: [facility] ORS;  Service: Ophthalmology;  Laterality: Left;  [place] 01:43 AP% 19.8 CDE 20.45 FLUID PACK LOT  #[id number] H  . KNEE ARTHROSCOPY Left 1992  . LIMBAL STEM CELL TRANSPLANT    . PAIN PUMP IMPLANTATION  2012  . stem cell implant  2008   [facility]    There were no vitals filed for this visit.      Subjective Assessment - 11/30/16 1445    Subjective Patient has been having more pain in his low back lately and will be recieiving a port on Monday. Patient will start taking cancer medications next week once a week for potentially a year for prevention. Patient reports having increased walkign distances and has walked 1/2 mile yesterday and the day before.    Pertinent History Pt. has multiple myeloma (dx 2008.) Pt. was unable to walk for a while afterward but did PT for ~6 mo. after. Pt states cancer went into remission, no meds 2008-2011, cancer returned 2011/2012. Hx of 4 compression fx. Took chemo drug for 5 years which was beneficial, not currently taking any chemo. Sept 2017 pt had 2nd stem cell transplant, recently pt has returned to going out in public. Before stem cell transplant, pt reports he could walk a mile, but now only walks to mailbox and back since he has lost his endurance. Pt progressed well with home health PT going up and down stairs. Reports 3/10 low back pain and takes an Aleve in the  a.m.  Pt has a pain pump for spine for daily use.    Patient Stated Goals Pt. would like to return to a gym-based program to begin regaining general strength    Currently in Pain? Yes   Pain Score 3    Pain Location Back   Pain Orientation Lower   Pain Descriptors / Indicators Aching     TherEx: Treadmill 2.2-2.4 for 10 minutes for improved capacity for ambulation Wall squats 1x10.  Standing LE stretches at edge of TM TG squats 1x20, Single leg squats 1x10 each leg, calf raises 20x. Cues for body mechanics. Tactile cueing for patellar linearity for single leg squats.  Walking partial lunges w/o UE support 2x10 ft. CGA to keep balance. Occasional loss of balance.  Discussed HEP/  POC  Neuro: Balance activities in hallway/ //-bars.  No LOB but fatigue noted. (see handouts).     Pt response for medical necessity: Pt. Will continue to benefit from skilled PT in order to keep progressing LE strengthening,balance training, and capacity for functional activities of daily life.         PT Long Term Goals - 11/21/16 1058      PT LONG TERM GOAL #1   Title Pt. will score a 56/56 on the Berg Balance Assessment in order to improve functional mobility and balance.    Baseline Ongoing,  Pt scored a 54/56 10/19/15.  Pt scored a 55/56 on 2/13/`17.   Time 4   Period Weeks   Status On-going     PT LONG TERM GOAL #2   Title Pt will demonstrate a normalized gait pattern with a wider base of support in order to improve overall mobility.    Baseline Patient is ambulating with a functional base of support, improving his confidence in ambulating.    Time 4   Period Weeks   Status Achieved     PT LONG TERM GOAL #3   Title Pt will ambulate for 10 minutes safely without fatigue in order to increase endurance while in the community.    Baseline Patient ambulates 10 minutes safely without fatigue.    Time 4   Period Weeks   Status Achieved     PT LONG TERM GOAL #4   Title Pt. will participate in a gym-based exercise program with no limitations to promote independence.    Baseline Pt. would like to return to gym to resume exercising regularly   Time 4   Period Weeks   Status On-going     PT LONG TERM GOAL #5   Title Pt will ambulate for 20 minutes safely without fatigue in order to increase endurance while in the community and walking dog.    Baseline Patient can ambulate 10 minutes safetly without fatigue on 2/8   Time 4   Period Weeks   Status New     PT LONG TERM GOAL #6   Title Patient will squat to pick up object from floor and return to standing 10x without LOB to increase safety with activities of daily living.   Baseline Patient can squat to pick up object 6  inches from ground and return to standing 10x with 2 episodes of LOB on 2/8   Time 4   Period Weeks   Status New     PT LONG TERM GOAL #7   Title Patient will complete 10 step ups onto 6" step without UE assist or LOB to increase mobility in the community.    Baseline Patient has  lateral LOB with step ups and occasionally requires UE assistance when fatigued.    Time 4   Period Weeks   Status New          Plan - 11/30/16 1450    Clinical Impression Statement Patient wore his normal street shoes to therapy today rather than his normal sneakers. Due to the alternate footwear the patient scuffed and had decreased step amplitude leading more LOB while ambulating in the clinic and on the treadmill. Patient demonstrated improved body mechanics and strength of LE during single limb squats on TG. Patient continues to require frequent seated breaks due to fatigue and will benefit from generalized strengthening and activity tolerance for improved quality of life.  Eccentric strengthening activities will continue to be implemented to improve functional mobility such as stair negotiation. Patient will benefit from continued skilled physical therapy for generalized strengthening, capacity for functional activities, balance, and community mobility.    Rehab Potential Good   Clinical Impairments Affecting Rehab Potential motivation, active lifestyle, family support.    PT Frequency 2x / week   PT Duration 4 weeks   PT Treatment/Interventions Gait training;Functional mobility training;Therapeutic exercise;Balance training;Neuromuscular re-education   PT Next Visit Plan balance activities, make fun!   PT Home Exercise Plan already issued    Consulted and Agree with Plan of Care Patient      Patient will benefit from skilled therapeutic intervention in order to improve the following deficits and impairments:  Abnormal gait, Postural dysfunction, Decreased activity tolerance, Decreased endurance, Decreased  strength, Decreased balance, Difficulty walking  Visit Diagnosis: Muscle weakness (generalized)  Difficulty in walking, not elsewhere classified  Imbalance  Abnormal posture     Problem List Patient Active Problem List   Diagnosis Date Noted  . Multiple myeloma in remission ([place]) 09/19/2016  . Xerostomia 08/23/2016  . Moderate protein-calorie malnutrition ([place]) 08/20/2016  . Thrombocytopenia ([place]) 08/20/2016  . Anemia 08/20/2016  . Nausea without vomiting 08/20/2016  . Atrial fibrillation ([place]) 07/10/2016  . 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 H/O autologous stem cell transplant ([place]) 07/10/2016  . Bone cancer ([place]) 06/15/2016  . Porokeratosis 04/06/2016  . Encounter for adjustment or management of infusion pump 03/23/2016  . Hypomagnesemia 12/01/2015  . Ejection fraction < 50% 11/30/2015  . Chronic pain 08/26/2015  . Presence of intrathecal pump 08/26/2015  . Long term current use of opiate analgesic 08/26/2015  . Long term prescription opiate use 08/26/2015  . Opiate use 08/26/2015  . Encounter for therapeutic drug level monitoring 08/26/2015  . Night muscle spasms 08/26/2015  . Muscle spasticity 08/26/2015  . Neuropathic pain 08/26/2015  . Neurogenic pain 08/26/2015  . Chronic low back pain 08/26/2015  . Lumbar spondylosis 08/26/2015  . Compression fracture of T12 vertebra (HCC) (70-75% magnitude) (with mild retropulsion) 08/26/2015  . Diffuse myofascial pain syndrome 08/26/2015  . Presence of implanted infusion pump (Medtronic, programmable, intrathecal pump) 08/26/2015  . Cancer associated pain 08/26/2015  . Encounter for interrogation of infusion pump 08/26/2015  . Chronic lumbar radicular pain 08/26/2015  . Opiate analgesic contract exists 08/26/2015  . Chronic pain syndrome 08/26/2015  . Chronic anticoagulation (Eliquis) 08/26/2015  . Clostridium difficile diarrhea 07/21/2015  . Diarrhea 07/19/2015  . Multiple  myeloma ([place]) 05/04/2015  . BPH with obstruction/lower urinary tract symptoms 04/29/2015  . MI (mitral incompetence) 12/03/2014  . TI (tricuspid incompetence) 12/03/2014  . Paroxysmal atrial fibrillation ([place]) 12/03/2014  . Breathlessness on exertion 12/03/2014  . Essential (primary) hypertension 01/29/2014  .  Acid reflux 01/29/2014  . Combined fat and carbohydrate induced hyperlipemia 01/29/2014  . Pulmonary embolism ([place]) 01/29/2014   Pura Spice, PT, DPT # [id number] Janna Arch, SPT 12/01/2016, 11:39 AM  [facility] [address] [place], [place], [address] Phone: [phone]   Fax:  [phone]  Name: Logan Perez MRN: [id number] Date of Birth: Feb 13, 1946

## 2016-12-03 MED ORDER — CEFAZOLIN IN D5W 1 GM/50ML IV SOLN: 1.0000 g | Freq: Once | INTRAVENOUS | Status: DC

## 2016-12-04 ENCOUNTER — Encounter: Payer: Self-pay | Admitting: *Deleted

## 2016-12-04 ENCOUNTER — Encounter: Payer: Medicare Other | Admitting: Physical Therapy

## 2016-12-04 ENCOUNTER — Ambulatory Visit
Admission: RE | Admit: 2016-12-04 | Discharge: 2016-12-04 | Disposition: A | Discharge status: Home / Self Care | Payer: Medicare Other | Source: Ambulatory Visit | Attending: Vascular Surgery | Admitting: Vascular Surgery

## 2016-12-04 ENCOUNTER — Encounter
Admission: RE | Disposition: A | Discharge status: Home / Self Care | Payer: Self-pay | Source: Ambulatory Visit | Attending: Vascular Surgery

## 2016-12-04 DIAGNOSIS — Z800 Family history of malignant neoplasm of digestive organs: Secondary | ICD-10-CM | POA: Insufficient documentation

## 2016-12-04 DIAGNOSIS — C9000 Multiple myeloma not having achieved remission: Secondary | ICD-10-CM | POA: Insufficient documentation

## 2016-12-04 DIAGNOSIS — I10 Essential (primary) hypertension: Secondary | ICD-10-CM | POA: Diagnosis not present

## 2016-12-04 DIAGNOSIS — Z9889 Other specified postprocedural states: Secondary | ICD-10-CM | POA: Diagnosis not present

## 2016-12-04 DIAGNOSIS — Z841 Family history of disorders of kidney and ureter: Secondary | ICD-10-CM | POA: Insufficient documentation

## 2016-12-04 DIAGNOSIS — Z9484 Stem cells transplant status: Secondary | ICD-10-CM | POA: Insufficient documentation

## 2016-12-04 DIAGNOSIS — Z87891 Personal history of nicotine dependence: Secondary | ICD-10-CM | POA: Insufficient documentation

## 2016-12-04 DIAGNOSIS — Z79899 Other long term (current) drug therapy: Secondary | ICD-10-CM | POA: Diagnosis not present

## 2016-12-04 HISTORY — PX: PORTA CATH INSERTION: CATH118285

## 2016-12-04 SURGERY — PORTA CATH INSERTION
Anesthesia: Moderate Sedation

## 2016-12-04 MED ORDER — SODIUM CHLORIDE 0.9 % IV SOLN
INTRAVENOUS | Status: DC
Start: 2016-12-04 — End: 2016-12-04
  Administered 2016-12-04: 10:00:00 via INTRAVENOUS

## 2016-12-04 MED ORDER — GENTAMICIN SULFATE 40 MG/ML IJ SOLN
Freq: Once | INTRAMUSCULAR | Status: DC
  Filled 2016-12-04: qty 2

## 2016-12-04 MED ORDER — FENTANYL CITRATE (PF) 100 MCG/2ML IJ SOLN
INTRAMUSCULAR | Status: AC
  Filled 2016-12-04: qty 2

## 2016-12-04 MED ORDER — CEFAZOLIN IN D5W 1 GM/50ML IV SOLN
1.0000 g | Freq: Once | INTRAVENOUS | Status: AC
  Administered 2016-12-04: 1 g via INTRAVENOUS

## 2016-12-04 MED ORDER — FENTANYL CITRATE (PF) 100 MCG/2ML IJ SOLN
INTRAMUSCULAR | Status: DC | PRN
  Administered 2016-12-04 (×2): 50 ug via INTRAVENOUS

## 2016-12-04 MED ORDER — CEFAZOLIN IN D5W 1 GM/50ML IV SOLN
INTRAVENOUS | Status: AC
  Filled 2016-12-04: qty 50

## 2016-12-04 MED ORDER — LIDOCAINE-EPINEPHRINE (PF) 2 %-1:200000 IJ SOLN
INTRAMUSCULAR | Status: AC
  Filled 2016-12-04: qty 20

## 2016-12-04 MED ORDER — MIDAZOLAM HCL 2 MG/2ML IJ SOLN
INTRAMUSCULAR | Status: DC | PRN
  Administered 2016-12-04 (×2): 2 mg via INTRAVENOUS

## 2016-12-04 MED ORDER — MIDAZOLAM HCL 5 MG/5ML IJ SOLN
INTRAMUSCULAR | Status: AC
  Filled 2016-12-04: qty 5

## 2016-12-04 SURGICAL SUPPLY — 7 items
KIT PORT POWER 8FR ISP CVUE (Catheter) ×3 IMPLANT
PACK ANGIOGRAPHY (CUSTOM PROCEDURE TRAY) ×3 IMPLANT
PAD GROUND ADULT SPLIT (MISCELLANEOUS) ×3 IMPLANT
PENCIL ELECTRO HAND CTR (MISCELLANEOUS) ×3 IMPLANT
SUT MNCRL AB 4-0 PS2 18 (SUTURE) ×3 IMPLANT
SUT PROLENE 0 CT 1 30 (SUTURE) ×3 IMPLANT
SUTURE VIC 3-0 (SUTURE) ×3 IMPLANT

## 2016-12-04 NOTE — H&P (Signed)
[facility] History & Physical Update  The patient was interviewed and re-examined.  The patient's previous History and Physical has been reviewed and is unchanged.  There is no change in the plan of care. We plan to proceed with the scheduled procedure.  Leotis Pain, MD  12/04/2016, 9:57 AM

## 2016-12-04 NOTE — Progress Notes (Signed)
Procedure done, patient tolerated well,will transfer to recovery area prior to discharge.

## 2016-12-04 NOTE — Progress Notes (Signed)
Patient here today for porta cath insertion per Dr Ala Dach monitor patient during and throughout procedure.

## 2016-12-04 NOTE — Op Note (Signed)
      [facility]       Operative Note  Date: 12/04/2016  Preoperative diagnosis:  1. Multiple myeloma  Postoperative diagnosis:  Same as above  Procedures: #1. Ultrasound guidance for vascular access to the right internal jugular vein. #2. Fluoroscopic guidance for placement of catheter. #3. Placement of CT compatible Port-A-Cath, right internal jugular vein.  Surgeon: Jason Dew, MD.   Anesthesia: Local with moderate conscious sedation for approximately 20 minutes using 4 mg of Versed and 100 mcg of Fentanyl  Fluoroscopy time: less than 1 minute  Contrast used: 0  Estimated blood loss: 10 cc  Indication for the procedure:  The patient is a 70 y.o.male with multiple myeloma.  The patient needs a Port-A-Cath for durable venous access, chemotherapy, lab draws, and CT scans. We are asked to place this. Risks and benefits were discussed and informed consent was obtained.  Description of procedure: The patient was brought to the vascular and interventional radiology suite.  Moderate conscious sedation was administered throughout the procedure during a face to face encounter with the patient with my supervision of the RN administering medicines and monitoring the patient's vital signs, pulse oximetry, telemetry and mental status throughout from the start of the procedure until the patient was taken to the recovery room. The right neck chest and shoulder were sterilely prepped and draped, and a sterile surgical field was created. Ultrasound was used to help visualize a patent right internal jugular vein. This was then accessed under direct ultrasound guidance without difficulty with the Seldinger needle and a permanent image was recorded. A J-wire was placed. After skin nick and dilatation, the peel-away sheath was then placed over the wire. I then anesthetized an area under the clavicle approximately 1-2 fingerbreadths. A transverse incision was created and an inferior  pocket was created with electrocautery and blunt dissection. The port was then brought onto the field, placed into the pocket and secured to the chest wall with 2 Prolene sutures. The catheter was connected to the port and tunneled from the subclavicular incision to the access site. Fluoroscopic guidance was then used to cut the catheter to an appropriate length. The catheter was then placed through the peel-away sheath and the peel-away sheath was removed. The catheter tip was parked in excellent location under fluorocoscopic guidance in the cavoatrial junction. The pocket was then irrigated with antibiotic impregnated saline and the wound was closed with a running 3-0 Vicryl and a 4-0 Monocryl. The access incision was closed with a single 4-0 Monocryl. The Huber needle was used to withdraw blood and flush the port with heparinized saline. Dermabond was then placed as a dressing. The patient tolerated the procedure well and was taken to the recovery room in stable condition.   Jason Dew 12/04/2016 11:24 AM   This note was created with Dragon Medical transcription system. Any errors in dictation are purely unintentional. 

## 2016-12-05 ENCOUNTER — Inpatient Hospital Stay: Payer: Medicare Other

## 2016-12-05 ENCOUNTER — Encounter: Payer: Self-pay | Admitting: Hematology and Oncology

## 2016-12-05 ENCOUNTER — Inpatient Hospital Stay (HOSPITAL_BASED_OUTPATIENT_CLINIC_OR_DEPARTMENT_OTHER): Payer: Medicare Other | Admitting: Oncology

## 2016-12-05 VITALS — BP 150/88 | HR 73 | Temp 97.9°F | Resp 18 | Wt 147.3 lb

## 2016-12-05 DIAGNOSIS — Z5112 Encounter for antineoplastic immunotherapy: Secondary | ICD-10-CM | POA: Diagnosis not present

## 2016-12-05 DIAGNOSIS — N400 Enlarged prostate without lower urinary tract symptoms: Secondary | ICD-10-CM

## 2016-12-05 DIAGNOSIS — Z79899 Other long term (current) drug therapy: Secondary | ICD-10-CM

## 2016-12-05 DIAGNOSIS — K219 Gastro-esophageal reflux disease without esophagitis: Secondary | ICD-10-CM | POA: Diagnosis not present

## 2016-12-05 DIAGNOSIS — Z87891 Personal history of nicotine dependence: Secondary | ICD-10-CM

## 2016-12-05 DIAGNOSIS — Z8673 Personal history of transient ischemic attack (TIA), and cerebral infarction without residual deficits: Secondary | ICD-10-CM

## 2016-12-05 DIAGNOSIS — C9001 Multiple myeloma in remission: Secondary | ICD-10-CM

## 2016-12-05 DIAGNOSIS — D638 Anemia in other chronic diseases classified elsewhere: Secondary | ICD-10-CM | POA: Diagnosis not present

## 2016-12-05 DIAGNOSIS — Z87311 Personal history of (healed) other pathological fracture: Secondary | ICD-10-CM

## 2016-12-05 DIAGNOSIS — Z86711 Personal history of pulmonary embolism: Secondary | ICD-10-CM

## 2016-12-05 DIAGNOSIS — E785 Hyperlipidemia, unspecified: Secondary | ICD-10-CM

## 2016-12-05 DIAGNOSIS — Z978 Presence of other specified devices: Secondary | ICD-10-CM

## 2016-12-05 DIAGNOSIS — I493 Ventricular premature depolarization: Secondary | ICD-10-CM | POA: Diagnosis not present

## 2016-12-05 DIAGNOSIS — R110 Nausea: Secondary | ICD-10-CM

## 2016-12-05 DIAGNOSIS — Z7901 Long term (current) use of anticoagulants: Secondary | ICD-10-CM

## 2016-12-05 DIAGNOSIS — Z9484 Stem cells transplant status: Secondary | ICD-10-CM

## 2016-12-05 DIAGNOSIS — M5489 Other dorsalgia: Secondary | ICD-10-CM

## 2016-12-05 DIAGNOSIS — I4891 Unspecified atrial fibrillation: Secondary | ICD-10-CM | POA: Diagnosis not present

## 2016-12-05 DIAGNOSIS — C9002 Multiple myeloma in relapse: Secondary | ICD-10-CM

## 2016-12-05 DIAGNOSIS — I10 Essential (primary) hypertension: Secondary | ICD-10-CM

## 2016-12-05 DIAGNOSIS — G8929 Other chronic pain: Secondary | ICD-10-CM

## 2016-12-05 DIAGNOSIS — D117 Benign neoplasm of other major salivary glands: Secondary | ICD-10-CM

## 2016-12-05 DIAGNOSIS — D696 Thrombocytopenia, unspecified: Secondary | ICD-10-CM

## 2016-12-05 LAB — CBC WITH DIFFERENTIAL/PLATELET
Basophils Absolute: 0 10*3/uL (ref 0–0.1)
Basophils Relative: 0 %
Eosinophils Absolute: 0.2 10*3/uL (ref 0–0.7)
Eosinophils Relative: 3 %
HCT: 32.1 % — ABNORMAL LOW (ref 40.0–52.0)
Hemoglobin: 11.3 g/dL — ABNORMAL LOW (ref 13.0–18.0)
Lymphocytes Relative: 23 %
Lymphs Abs: 1.8 10*3/uL (ref 1.0–3.6)
MCH: 38 pg — ABNORMAL HIGH (ref 26.0–34.0)
MCHC: 35.1 g/dL (ref 32.0–36.0)
MCV: 108.3 fL — ABNORMAL HIGH (ref 80.0–100.0)
Monocytes Absolute: 0.7 10*3/uL (ref 0.2–1.0)
Monocytes Relative: 10 %
Neutro Abs: 4.9 10*3/uL (ref 1.4–6.5)
Neutrophils Relative %: 64 %
Platelets: 139 10*3/uL — ABNORMAL LOW (ref 150–440)
RBC: 2.96 MIL/uL — ABNORMAL LOW (ref 4.40–5.90)
RDW: 12.2 % (ref 11.5–14.5)
WBC: 7.6 10*3/uL (ref 3.8–10.6)

## 2016-12-05 LAB — COMPREHENSIVE METABOLIC PANEL
ALT: 16 U/L — ABNORMAL LOW (ref 17–63)
AST: 23 U/L (ref 15–41)
Albumin: 4.2 g/dL (ref 3.5–5.0)
Alkaline Phosphatase: 56 U/L (ref 38–126)
Anion gap: 6 (ref 5–15)
BUN: 17 mg/dL (ref 6–20)
CO2: 23 mmol/L (ref 22–32)
Calcium: 9.3 mg/dL (ref 8.9–10.3)
Chloride: 111 mmol/L (ref 101–111)
Creatinine, Ser: 1.25 mg/dL — ABNORMAL HIGH (ref 0.61–1.24)
GFR calc Af Amer: >60 mL/min (ref >60)
GFR calc non Af Amer: 57 mL/min — ABNORMAL LOW (ref >60)
Glucose, Bld: 114 mg/dL — ABNORMAL HIGH (ref 65–99)
Potassium: 3.6 mmol/L (ref 3.5–5.1)
Sodium: 140 mmol/L (ref 135–145)
Total Bilirubin: 0.7 mg/dL (ref 0.3–1.2)
Total Protein: 6.4 g/dL — ABNORMAL LOW (ref 6.5–8.1)

## 2016-12-05 LAB — MAGNESIUM: Magnesium: 1.8 mg/dL (ref 1.7–2.4)

## 2016-12-05 MED ORDER — SODIUM CHLORIDE 0.9 % IV SOLN
1000.0000 mg | Freq: Once | INTRAVENOUS | Status: AC
Start: 2016-12-05 — End: 2016-12-05
  Administered 2016-12-05: 1000 mg via INTRAVENOUS
  Filled 2016-12-05: qty 50

## 2016-12-05 MED ORDER — DIPHENHYDRAMINE HCL 25 MG PO CAPS
50.0000 mg | ORAL_CAPSULE | Freq: Once | ORAL | Status: AC
  Administered 2016-12-05: 50 mg via ORAL
  Filled 2016-12-05: qty 2

## 2016-12-05 MED ORDER — ACETAMINOPHEN 325 MG PO TABS
650.0000 mg | ORAL_TABLET | Freq: Once | ORAL | Status: AC
  Administered 2016-12-05: 650 mg via ORAL
  Filled 2016-12-05: qty 2

## 2016-12-05 MED ORDER — ONDANSETRON 8 MG PO TBDP
8.0000 mg | ORAL_TABLET | Freq: Once | ORAL | Status: AC
  Administered 2016-12-05: 8 mg via ORAL
  Filled 2016-12-05: qty 1

## 2016-12-05 MED ORDER — SODIUM CHLORIDE 0.9% FLUSH
10.0000 mL | INTRAVENOUS | Status: DC | PRN
  Administered 2016-12-05: 10 mL via INTRAVENOUS
  Filled 2016-12-05: qty 10

## 2016-12-05 MED ORDER — SODIUM CHLORIDE 0.9 % IV SOLN: 16.0000 mg/kg | Freq: Once | INTRAVENOUS | Status: DC

## 2016-12-05 MED ORDER — SODIUM CHLORIDE 0.9 % IV SOLN
Freq: Once | INTRAVENOUS | Status: AC
  Administered 2016-12-05: 10:00:00 via INTRAVENOUS
  Filled 2016-12-05: qty 1000

## 2016-12-05 MED ORDER — HEPARIN SOD (PORK) LOCK FLUSH 100 UNIT/ML IV SOLN
500.0000 [IU] | Freq: Once | INTRAVENOUS | Status: AC
Start: 2016-12-05 — End: 2016-12-05
  Administered 2016-12-05: 500 [IU] via INTRAVENOUS

## 2016-12-05 MED ORDER — SODIUM CHLORIDE 0.9 % IV SOLN: Freq: Once | INTRAVENOUS | Status: DC

## 2016-12-05 NOTE — Progress Notes (Signed)
Patient states he had his port placed yesterday.  Doing well overall.

## 2016-12-05 NOTE — Progress Notes (Signed)
Patient last got darzalex on Aug 2017.  Verified with MD.  Will restart as "new" darzalex today, changed order.

## 2016-12-07 ENCOUNTER — Ambulatory Visit: Payer: Medicare Other | Attending: Hematology and Oncology | Admitting: Physical Therapy

## 2016-12-07 DIAGNOSIS — R2689 Other abnormalities of gait and mobility: Secondary | ICD-10-CM

## 2016-12-07 DIAGNOSIS — M6281 Muscle weakness (generalized): Secondary | ICD-10-CM | POA: Diagnosis not present

## 2016-12-07 DIAGNOSIS — R293 Abnormal posture: Secondary | ICD-10-CM | POA: Diagnosis present

## 2016-12-07 DIAGNOSIS — R262 Difficulty in walking, not elsewhere classified: Secondary | ICD-10-CM

## 2016-12-07 NOTE — Therapy (Signed)
[facility] [address] [place], [place], [address] Phone: [phone]   Fax:  [phone]  Physical Therapy Treatment  Patient Details  Name: Logan Perez MRN: [id number] Date of Birth: [birth year] Referring Provider: Nolon Stalls, MD  Encounter Date: 12/07/2016    Past Medical History:  Diagnosis Date  . Anxiety   . Atrial fibrillation ([place])   . BPH (benign prostatic hyperplasia)   . Complication of anesthesia    BAD HEADACHE NIGHT OF FIRST CATARACT  . Difficulty voiding   . Dysrhythmia    A FIB  . Elevated PSA   . GERD (gastroesophageal reflux disease)   . HLD (hyperlipidemia)   . HOH (hard of hearing)   . Hypertension   . Multiple myeloma ([place])   . Neuropathy ([place])   . Pain    BACK  . Palpitations   . Pneumonia   . Pulmonary embolism ([place])   . Stroke [facility])    TIA    Past Surgical History:  Procedure Laterality Date  . BACK SURGERY  1994  . CATARACT EXTRACTION W/PHACO Right 05/02/2016   Procedure: CATARACT EXTRACTION PHACO AND INTRAOCULAR LENS PLACEMENT (IOC);  Surgeon: Birder Robson, MD;  Location: [facility] ORS;  Service: Ophthalmology;  Laterality: Right;  [place] 1.06 AP% 20.6 CDE 13.70 FLUID PACK LOT # [id number] H  . CATARACT EXTRACTION W/PHACO Left 05/16/2016   Procedure: CATARACT EXTRACTION PHACO AND INTRAOCULAR LENS PLACEMENT (IOC);  Surgeon: Birder Robson, MD;  Location: [facility] ORS;  Service: Ophthalmology;  Laterality: Left;  [place] 01:43 AP% 19.8 CDE 20.45 FLUID PACK LOT #[id number] H  . KNEE ARTHROSCOPY Left 1992  . LIMBAL STEM CELL TRANSPLANT    . PAIN PUMP IMPLANTATION  2012  . PORTA CATH INSERTION N/A 12/04/2016   Procedure: Glori Luis Cath Insertion;  Surgeon: Algernon Huxley, MD;  Location: [facility];  Service: Cardiovascular;  Laterality: N/A;  . stem cell implant  2008   [facility]    There were no vitals filed for this visit.    TherEx: Scifit L7 B UE/LE  Treadmill 2.2-2.4 for 10 minutes  for improved capacity for ambulation Wall squats 1x10.  Standing LE stretches at edge of TM TG squats 1x20, Single leg squats 1x10 each leg, calf raises 20x. Cues for body mechanics. Tactile cueing for patellar linearity for single leg squats.  Walking partial lunges w/o UE support 2x10 ft. CGA to keep balance. Occasional loss of balance.  Discussed HEP/ POC  Neuro: Balance activities in hallway/ //-bars.  No LOB but fatigue noted. (see handouts).    Pt response for medical necessity: Pt. Will continue to benefit from skilled PT in order to keep progressing LE strengthening,balance training, and capacity for functional activities of daily life.       PT Long Term Goals - 11/21/16 1058      PT LONG TERM GOAL #1   Title Pt. will score a 56/56 on the Berg Balance Assessment in order to improve functional mobility and balance.    Baseline Ongoing,  Pt scored a 54/56 10/19/15.  Pt scored a 55/56 on 2/13/`17.   Time 4   Period Weeks   Status On-going     PT LONG TERM GOAL #2   Title Pt will demonstrate a normalized gait pattern with a wider base of support in order to improve overall mobility.    Baseline Patient is ambulating with a functional base of support, improving his confidence in ambulating.    Time 4  Period Weeks   Status Achieved     PT LONG TERM GOAL #3   Title Pt will ambulate for 10 minutes safely without fatigue in order to increase endurance while in the community.    Baseline Patient ambulates 10 minutes safely without fatigue.    Time 4   Period Weeks   Status Achieved     PT LONG TERM GOAL #4   Title Pt. will participate in a gym-based exercise program with no limitations to promote independence.    Baseline Pt. would like to return to gym to resume exercising regularly   Time 4   Period Weeks   Status On-going     PT LONG TERM GOAL #5   Title Pt will ambulate for 20 minutes safely without fatigue in order to increase endurance while in the community  and walking dog.    Baseline Patient can ambulate 10 minutes safetly without fatigue on 2/8   Time 4   Period Weeks   Status New     PT LONG TERM GOAL #6   Title Patient will squat to pick up object from floor and return to standing 10x without LOB to increase safety with activities of daily living.   Baseline Patient can squat to pick up object 6 inches from ground and return to standing 10x with 2 episodes of LOB on 2/8   Time 4   Period Weeks   Status New     PT LONG TERM GOAL #7   Title Patient will complete 10 step ups onto 6" step without UE assist or LOB to increase mobility in the community.    Baseline Patient has lateral LOB with step ups and occasionally requires UE assistance when fatigued.    Time 4   Period Weeks   Status New             Patient will benefit from skilled therapeutic intervention in order to improve the following deficits and impairments:     Visit Diagnosis: No diagnosis found.     Problem List Patient Active Problem List   Diagnosis Date Noted  . Multiple myeloma in remission ([employer]) 09/19/2016  . Xerostomia 08/23/2016  . Moderate protein-calorie malnutrition ([place]) 08/20/2016  . Thrombocytopenia ([place]) 08/20/2016  . Anemia 08/20/2016  . Nausea without vomiting 08/20/2016  . Atrial fibrillation ([place]) 07/10/2016  . 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 H/O autologous stem cell transplant ([place]) 07/10/2016  . Bone cancer ([place]) 06/15/2016  . Porokeratosis 04/06/2016  . Encounter for adjustment or management of infusion pump 03/23/2016  . Hypomagnesemia 12/01/2015  . Ejection fraction < 50% 11/30/2015  . Chronic pain 08/26/2015  . Presence of intrathecal pump 08/26/2015  . Long term current use of opiate analgesic 08/26/2015  . Long term prescription opiate use 08/26/2015  . Opiate use 08/26/2015  . Encounter for therapeutic drug level monitoring 08/26/2015  . Night muscle spasms 08/26/2015  .  Muscle spasticity 08/26/2015  . Neuropathic pain 08/26/2015  . Neurogenic pain 08/26/2015  . Chronic low back pain 08/26/2015  . Lumbar spondylosis 08/26/2015  . Compression fracture of T12 vertebra (HCC) (70-75% magnitude) (with mild retropulsion) 08/26/2015  . Diffuse myofascial pain syndrome 08/26/2015  . Presence of implanted infusion pump (Medtronic, programmable, intrathecal pump) 08/26/2015  . Cancer associated pain 08/26/2015  . Encounter for interrogation of infusion pump 08/26/2015  . Chronic lumbar radicular pain 08/26/2015  . Opiate analgesic contract exists 08/26/2015  . Chronic pain syndrome 08/26/2015  .  Chronic anticoagulation (Eliquis) 08/26/2015  . Clostridium difficile diarrhea 07/21/2015  . Diarrhea 07/19/2015  . Multiple myeloma ([place]) 05/04/2015  . BPH with obstruction/lower urinary tract symptoms 04/29/2015  . MI (mitral incompetence) 12/03/2014  . TI (tricuspid incompetence) 12/03/2014  . Paroxysmal atrial fibrillation ([place]) 12/03/2014  . Breathlessness on exertion 12/03/2014  . Essential (primary) hypertension 01/29/2014  . Acid reflux 01/29/2014  . Combined fat and carbohydrate induced hyperlipemia 01/29/2014  . Pulmonary embolism ([place]) 01/29/2014    Pura Spice 12/07/2016, 9:41 AM  [facility] [address] [place], [place], [address] Phone: [phone]   Fax:  [phone]  Name: Nyzir Dubois MRN: [id number] Date of Birth: 06-Sep-1946

## 2016-12-07 NOTE — Therapy (Signed)
[facility] [address] [place], [place], [address] Phone: [phone]   Fax:  [phone]  Physical Therapy Treatment  Patient Details  Name: Logan Perez MRN: [id number] Date of Birth: [birth year] Referring Provider: Nolon Stalls, MD  Encounter Date: 12/07/2016      PT End of Session - 12/07/16 1412    Visit Number 13   Number of Visits 15   Date for PT Re-Evaluation 12/13/16   Authorization - Visit Number 13   Authorization - Number of Visits 17   PT Start Time [phone]   PT Stop Time 1031   PT Time Calculation (min) 49 min   Equipment Utilized During Treatment Gait belt   Activity Tolerance Patient tolerated treatment well   Behavior During Therapy [facility] for tasks assessed/performed      Past Medical History:  Diagnosis Date  . Anxiety   . Atrial fibrillation ([place])   . BPH (benign prostatic hyperplasia)   . Complication of anesthesia    BAD HEADACHE NIGHT OF FIRST CATARACT  . Difficulty voiding   . Dysrhythmia    A FIB  . Elevated PSA   . GERD (gastroesophageal reflux disease)   . HLD (hyperlipidemia)   . HOH (hard of hearing)   . Hypertension   . Multiple myeloma ([place])   . Neuropathy ([place])   . Pain    BACK  . Palpitations   . Pneumonia   . Pulmonary embolism ([place])   . Stroke [facility])    TIA    Past Surgical History:  Procedure Laterality Date  . BACK SURGERY  1994  . CATARACT EXTRACTION W/PHACO Right 05/02/2016   Procedure: CATARACT EXTRACTION PHACO AND INTRAOCULAR LENS PLACEMENT (IOC);  Surgeon: Birder Robson, MD;  Location: [facility] ORS;  Service: Ophthalmology;  Laterality: Right;  [place] 1.06 AP% 20.6 CDE 13.70 FLUID PACK LOT # [id number]  . CATARACT EXTRACTION W/PHACO Left 05/16/2016   Procedure: CATARACT EXTRACTION PHACO AND INTRAOCULAR LENS PLACEMENT (IOC);  Surgeon: Birder Robson, MD;  Location: [facility] ORS;  Service: Ophthalmology;  Laterality: Left;  [place] 01:43 AP% 19.8 CDE 20.45 FLUID PACK LOT  #[id number] H  . KNEE ARTHROSCOPY Left 1992  . LIMBAL STEM CELL TRANSPLANT    . PAIN PUMP IMPLANTATION  2012  . PORTA CATH INSERTION N/A 12/04/2016   Procedure: Glori Luis Cath Insertion;  Surgeon: Algernon Huxley, MD;  Location: [place];  Service: Cardiovascular;  Laterality: N/A;  . stem cell implant  2008   [facility]    There were no vitals filed for this visit.      Subjective Assessment - 12/07/16 0946    Subjective Patient recieved a port monday and started medications/tx for cancer yesterday. Patient continues to walk dog everyday and has been getting up from floor without having to use support surface as learned in previous sessions.   Patient is accompained by: Family member   Pertinent History Pt. has multiple myeloma (dx 2008.) Pt. was unable to walk for a while afterward but did PT for ~6 mo. after. Pt states cancer went into remission, no meds 2008-2011, cancer returned 2011/2012. Hx of 4 compression fx. Took chemo drug for 5 years which was beneficial, not currently taking any chemo. Sept 2017 pt had 2nd stem cell transplant, recently pt has returned to going out in public. Before stem cell transplant, pt reports he could walk a mile, but now only walks to mailbox and back since he has lost his endurance. Pt progressed  well with home health PT going up and down stairs. Reports 3/10 low back pain and takes an Aleve in the a.m.  Pt has a pain pump for spine for daily use.    Currently in Pain? No/denies      TherEx: Scifit L7 B UE/LE Resisted walking in // bars 3x each direction. Occasional LOB and scissoring.  TG squats 2x15,  Cues for body mechanics. Tactile cueing for patellar linearity.  Walking partial lunges w/o UE support 4x10 ft. In // bars CGA to keep balance. Occasional loss of balance into bars.  Discussed HEP/ POC  Neuro: Obstacle course 4x through: balance, step up 6", over 3" twice, cone taps left and right 6x, Airex pad 10x cone tap each time, Airex pad cone reach  10x each arm. andem walking on line in // bars 4x. Occasional LOB    Pt response for medical necessity: Pt. Will continue to benefit from skilled PT in order to keep progressing LE strengthening,balance training, and capacity for functional activities of daily life.       PT Long Term Goals - 11/21/16 1058      PT LONG TERM GOAL #1   Title Pt. will score a 56/56 on the Berg Balance Assessment in order to improve functional mobility and balance.    Baseline Ongoing,  Pt scored a 54/56 10/19/15.  Pt scored a 55/56 on 2/13/`17.   Time 4   Period Weeks   Status On-going     PT LONG TERM GOAL #2   Title Pt will demonstrate a normalized gait pattern with a wider base of support in order to improve overall mobility.    Baseline Patient is ambulating with a functional base of support, improving his confidence in ambulating.    Time 4   Period Weeks   Status Achieved     PT LONG TERM GOAL #3   Title Pt will ambulate for 10 minutes safely without fatigue in order to increase endurance while in the community.    Baseline Patient ambulates 10 minutes safely without fatigue.    Time 4   Period Weeks   Status Achieved     PT LONG TERM GOAL #4   Title Pt. will participate in a gym-based exercise program with no limitations to promote independence.    Baseline Pt. would like to return to gym to resume exercising regularly   Time 4   Period Weeks   Status On-going     PT LONG TERM GOAL #5   Title Pt will ambulate for 20 minutes safely without fatigue in order to increase endurance while in the community and walking dog.    Baseline Patient can ambulate 10 minutes safetly without fatigue on 2/8   Time 4   Period Weeks   Status New     PT LONG TERM GOAL #6   Title Patient will squat to pick up object from floor and return to standing 10x without LOB to increase safety with activities of daily living.   Baseline Patient can squat to pick up object 6 inches from ground and return to  standing 10x with 2 episodes of LOB on 2/8   Time 4   Period Weeks   Status New     PT LONG TERM GOAL #7   Title Patient will complete 10 step ups onto 6" step without UE assist or LOB to increase mobility in the community.    Baseline Patient has lateral LOB with step ups and occasionally  requires UE assistance when fatigued.    Time 4   Period Weeks   Status New               Plan - 12/07/16 1416    Clinical Impression Statement Patient presented to therapy after receiving [facility] Monday and first round of medication for cancer yesterday. Generalized fatigue was noted but not abnormal. Pt. had decreased balance with more frequent LOB during challenging tasks such as cone taps and tasks on Airex pad. Frequent seated breaks with water consumption were required due to fatigue.  Body mechanics in strengthening exercises continue to progress with decreased need for assistance. Pt. performed resisted ambulation in // bars with occasional LOB due to tension but improved performance with repetition. Patient will benefit from continued skilled physical therapy for generalized strengthening, capacity for functional activities, balance, and community mobility.    Rehab Potential Good   Clinical Impairments Affecting Rehab Potential motivation, active lifestyle, family support.    PT Frequency 2x / week   PT Duration 4 weeks   PT Treatment/Interventions Gait training;Functional mobility training;Therapeutic exercise;Balance training;Neuromuscular re-education   PT Next Visit Plan balance, LE strengthening, resisted walking   PT Home Exercise Plan already issued    Consulted and Agree with Plan of Care Patient      Patient will benefit from skilled therapeutic intervention in order to improve the following deficits and impairments:  Abnormal gait, Postural dysfunction, Decreased activity tolerance, Decreased endurance, Decreased strength, Decreased balance, Difficulty walking  Visit  Diagnosis: Muscle weakness (generalized)  Difficulty in walking, not elsewhere classified  Imbalance  Abnormal posture     Problem List Patient Active Problem List   Diagnosis Date Noted  . Multiple myeloma in remission (HCC) 09/19/2016  . Xerostomia 08/23/2016  . Moderate protein-calorie malnutrition (HCC) 08/20/2016  . Thrombocytopenia (HCC) 08/20/2016  . Anemia 08/20/2016  . Nausea without vomiting 08/20/2016  . Atrial fibrillation (HCC) 07/10/2016  . 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 H/O autologous stem cell transplant (HCC) 07/10/2016  . Bone cancer (HCC) 06/15/2016  . Porokeratosis 04/06/2016  . Encounter for adjustment or management of infusion pump 03/23/2016  . Hypomagnesemia 12/01/2015  . Ejection fraction < 50% 11/30/2015  . Chronic pain 08/26/2015  . Presence of intrathecal pump 08/26/2015  . Long term current use of opiate analgesic 08/26/2015  . Long term prescription opiate use 08/26/2015  . Opiate use 08/26/2015  . Encounter for therapeutic drug level monitoring 08/26/2015  . Night muscle spasms 08/26/2015  . Muscle spasticity 08/26/2015  . Neuropathic pain 08/26/2015  . Neurogenic pain 08/26/2015  . Chronic low back pain 08/26/2015  . Lumbar spondylosis 08/26/2015  . Compression fracture of T12 vertebra (HCC) (70-75% magnitude) (with mild retropulsion) 08/26/2015  . Diffuse myofascial pain syndrome 08/26/2015  . Presence of implanted infusion pump ([employer], programmable, intrathecal pump) 08/26/2015  . Cancer associated pain 08/26/2015  . Encounter for interrogation of infusion pump 08/26/2015  . Chronic lumbar radicular pain 08/26/2015  . Opiate analgesic contract exists 08/26/2015  . Chronic pain syndrome 08/26/2015  . Chronic anticoagulation (Eliquis) 08/26/2015  . Clostridium difficile diarrhea 07/21/2015  . Diarrhea 07/19/2015  . Multiple myeloma (HCC) 05/04/2015  . BPH with obstruction/lower  urinary tract symptoms 04/29/2015  . MI (mitral incompetence) 12/03/2014  . TI (tricuspid incompetence) 12/03/2014  . Paroxysmal atrial fibrillation (HCC) 12/03/2014  . Breathlessness on exertion 12/03/2014  . Essential (primary) hypertension 01/29/2014  . Acid reflux 01/29/2014  . Combined fat  and carbohydrate induced hyperlipemia 01/29/2014  . Pulmonary embolism ([place]) 01/29/2014   Pura Spice, PT, DPT # [id number] Janna Arch, SPT 12/08/2016, 2:36 PM  [place] [facility] [address] [place], [place], [address] Phone: [phone]   Fax:  [phone]  Name: Logan Perez MRN: [id number] Date of Birth: [birth year]

## 2016-12-08 NOTE — Progress Notes (Signed)
[facility] day:  12/08/16  Chief Complaint: [name] is a 71 y.o. 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 He continues to feel well and remains asymptomatic. He has no neurologic complaints. He denies any pain. He has good appetite and denies weight loss. He has no chest pain or shortness of breath. He denies any nausea, vomiting, constipation, or diarrhea. He has no urinary complaints. Patient feels at his baseline and offers no specific complaints today.    Past Medical History:  Diagnosis Date  . Anxiety   . Atrial fibrillation ([place])   . BPH (benign prostatic hyperplasia)   . Complication of anesthesia    BAD HEADACHE NIGHT OF FIRST CATARACT  . Difficulty voiding   . Dysrhythmia    A FIB  . Elevated PSA   . GERD (gastroesophageal reflux disease)   . HLD (hyperlipidemia)   . HOH (hard of hearing)   . Hypertension   . Multiple myeloma ([place])   . Neuropathy ([place])   . Pain    BACK  . Palpitations   . Pneumonia   . Pulmonary embolism ([place])   . Stroke Logan Perez)    TIA    Past Surgical History:  Procedure Laterality Date  . BACK SURGERY  1994  . CATARACT EXTRACTION W/PHACO Right 05/02/2016   Procedure: CATARACT EXTRACTION PHACO AND INTRAOCULAR LENS PLACEMENT (IOC);  Surgeon: Birder Robson, MD;  Location: [facility] OR[facility];  Service: Ophthalmology;  Laterality: Right;  [place] 1.06 AP% 20.6 CDE 13.70 FLUID PACK LOT # [id number]  . CATARACT EXTRACTION W/PHACO Left 05/16/2016   Procedure: CATARACT EXTRACTION PHACO AND INTRAOCULAR LENS PLACEMENT (IOC);  Surgeon: Birder Robson, MD;  Location: [facility];  Service: Ophthalmology;  Laterality: Left;  [place] 01:43 AP% 19.8 CDE 20.45 FLUID PACK LOT  #[id number] H  . KNEE ARTHROSCOPY Left 1992  . LIMBAL STEM CELL TRANSPLANT    . PAIN PUMP IMPLANTATION  2012  . PORTA CATH INSERTION N/A 12/04/2016   Procedure: Glori Luis Cath Insertion;  Surgeon: Algernon Huxley, MD;  Location: [facility];  Service: Cardiovascular;  Laterality: N/A;  . stem cell implant  2008   [facility]    Family History  Problem Relation Age of Onset  . Cancer Father     throat  . Kidney disease Sister   . Stroke    . Bladder Cancer Neg Hx   . Prostate cancer Neg Hx     Social History:  reports that he has quit smoking. His smoking use included Cigarettes. He has a 30.00 pack-year smoking history. He has quit using smokeless tobacco. He reports that he does not drink alcohol. His drug history is not on file.  He has a wire haired dachshund.  He lives in [place].  His wife's name is Logan Perez.  The patient is alone today.  Allergies:  Allergies  Allergen Reactions  . Azithromycin Diarrhea    Possible cause of C. Diff  . Zometa [Zoledronic Acid] Other (See Comments)    ONG- Osteonecrosis of the jaw   . Rivaroxaban Rash    Current Medications: Current Outpatient Prescriptions  Medication Sig Dispense Refill  . acetaminophen (TYLENOL) 500 MG tablet Take 1,000 mg by mouth every 8 (eight) hours as needed for mild pain or  headache.     . ALPRAZolam (XANAX) 0.25 MG tablet TAKE 1 TABLET BY MOUTH EVERY NIGHT AT BEDTIME AS NEEDED FOR ANXIETY. (Patient taking differently: Take 0.25 mg by mouth at bedtime as needed for anxiety or sleep. ) 30 tablet 3  . apixaban (ELIQUIS) 5 MG TABS tablet TAKE 1 TABLET(5 MG) BY MOUTH TWICE DAILY 60 tablet 0  . baclofen (LIORESAL) 10 MG tablet Take 1 tablet (10 mg total) by mouth at bedtime. (Patient taking differently: Take 10 mg by mouth at bedtime as needed (restless legs). ) 30 each 3  . calcium citrate-vitamin D (CITRACAL+D) 315-200 MG-UNIT per tablet Take 1 tablet by mouth daily.     . cetirizine (ZYRTEC) 10 MG tablet Take 10 mg by mouth daily  as needed for allergies.     [name] dexamethasone (DECADRON) 4 MG tablet Take 4 tabs PO 1 hr prior to infusion, then take 1 tab on day 2 and day 3 30 tablet 1  . diltiazem (CARDIZEM) 60 MG tablet Take 60 mg by mouth as needed (raised heart rate).     [name] diltiazem (DILACOR XR) 120 MG 24 hr capsule Take 120 mg by mouth daily.    [name] ENSURE (ENSURE) Take 237 mLs by mouth daily.    . fluticasone (FLONASE) 50 MCG/ACT nasal spray Place 1 spray into the nose daily as needed for allergies.     . Iron-Vitamins (GERITOL PO) Take 1 tablet by mouth daily.     [name] loperamide (IMODIUM) 2 MG capsule Take 2-4 mg by mouth as needed for diarrhea or loose stools. Reported on 04/26/2016    . lovastatin (MEVACOR) 20 MG tablet Take 20 mg by mouth every evening.     . montelukast (SINGULAIR) 10 MG tablet TAKE 1 TABLET BY MOUTH ONCE A DAY AS DIRECTED. (Patient taking differently: Take 23ms daily the day before the infusion, the day of, and the 2 days after infusion) 30 tablet 0  . naproxen sodium (ANAPROX) 220 MG tablet Take 220-440 mg by mouth 2 (two) times daily as needed (pain).    .Marland Kitchenomeprazole (PRILOSEC) 20 MG capsule Take 20 mg by mouth daily.     . potassium chloride SA (K-DUR,KLOR-CON) 20 MEQ tablet TAKE 1 TABLET(20 MEQ) BY MOUTH TWICE DAILY (Patient taking differently: TAKE 1 TABLET(20 MEQ) BY MOUTH DAILY) 180 tablet 0  . prochlorperazine (COMPAZINE) 10 MG tablet Take 10 mg by mouth every 6 (six) hours as needed for nausea or vomiting.    . ranitidine (ZANTAC) 150 MG tablet Take 150 mg by mouth at bedtime.     . tamsulosin (FLOMAX) 0.4 MG CAPS capsule Take 1 capsule (0.4 mg total) by mouth daily. 90 capsule 4  . valACYclovir (VALTREX) 500 MG tablet TAKE 1 TABLET BY MOUTH DAILY (Patient taking differently: take 5042m in the evening) 30 tablet 0  . PAIN MANAGEMENT IT PUMP REFILL 1 each by Intrathecal route once. Medication: PF Fentanyl 1,500.0 mcg/ml PF Bupivicaine 30.0 mg/ml PF Clonidine 300.45m55mml Total Volume: 40  ml Needed by 01-02-17 @ 1000 1 each 0   Current Facility-Administered Medications  Medication Dose Route Frequency Provider Last Rate Last Dose  . 0.9 %  sodium chloride infusion   Intravenous Once JasAlgernon HuxleyD      . 0.9 %  sodium chloride infusion   Intravenous Once JasAlgernon HuxleyD      . ceFAZolin (ANCEF) IVPB 1 g/50 mL premix  1 g Intravenous Once JasAlgernon HuxleyD      .  gentamicin (GARAMYCIN) 80 mg in sodium chloride irrigation 0.9 % 500 mL irrigation   Irrigation Once Algernon Huxley, MD        Review of Systems:  GENERAL: Feels "good".  Energy level improving.  No fevers or sweats.   PERFORMANCE STATUS (ECOG): 1 HEENT:  Cataracts s/p surgery.  Dry mouth.  Taste sensation altered.  No sore throat, mouth sores or tenderness. Lungs:  No shortness of breath or cough.  No hemoptysis. Cardiac:  No chest pain, palpitations, orthopnea, or PND. GI:  Appetite good.  Nausea managed with Compazine.  Loose stool (chronic).  No vomiting, constipation, melena or hematochezia. GU:  No urgency, frequency, dysuria, or hematuria. Musculoskeletal:  Compression fracture of back on a pain pump. No muscle tenderness. Extremities:  No pain or swelling. Skin:  Fragile skin.  No rashes or skin changes. Neuro:  Little tremor (chronic).  Neuropathy in feet.  No headache, weakness, balance or coordination issues. Endocrine:  No diabetes, thyroid issues, hot flashes or night sweats. Psych:  No mood changes, depression or anxiety. Pain:  Chronic back pain (pain well managed with pump). Review of systems:  All other systems reviewed and found to be negative.   Physical Exam: Blood pressure (!) 150/88, pulse 73, temperature 97.9 F (36.6 C), temperature source Tympanic, resp. rate 18, weight 147 lb 4.3 oz (66.8 kg). GENERAL:  Thin elderly gentleman sitting comfortably in the exam room in no acute distress.  MENTAL STATUS:  Alert and oriented to person, place and time. HEAD:  Thin short gray hair.  Graying  goateeTemporal wasting.  Normocephalic, atraumatic, face symmetric, no Cushingoid features. EYES:  Glasses.  Blue eyes s/p cataract surgery.  Pupils equal round and reactive to light and accomodation.  No conjunctivitis or scleral icterus. ENT:  Hearing aide.  Oropharynx clear without lesion.  Hearing aide.  Tongue normal. Mucous membranes moist.  RESPIRATORY:  Clear to auscultation without rales, wheezes or rhonchi. CARDIOVASCULAR:  Regular rate and rhythm without murmur, rub or gallop. ABDOMEN:  RUQ pain pump.  Soft, non-tender, with active bowel sounds, and no hepatosplenomegaly.  No masses. SKIN:  No rashes, ulcers or lesions. EXTREMITIES: Trace ankle edema.  No skin discoloration or tenderness.  No palpable cords. LYMPH NODES: No palpable cervical, supraclavicular, axillary or inguinal adenopathy  NEUROLOGICAL: Intention tremor. PSYCH:  Appropriate.    Appointment on 12/05/2016  Component Date Value Ref Range Status  . Sodium 12/05/2016 140  135 - 145 mmol/L Final  . Potassium 12/05/2016 3.6  3.5 - 5.1 mmol/L Final  . Chloride 12/05/2016 111  101 - 111 mmol/L Final  . CO2 12/05/2016 23  22 - 32 mmol/L Final  . Glucose, Bld 12/05/2016 114* 65 - 99 mg/dL Final  . BUN 12/05/2016 17  6 - 20 mg/dL Final  . Creatinine, Ser 12/05/2016 1.25* 0.61 - 1.24 mg/dL Final  . Calcium 12/05/2016 9.3  8.9 - 10.3 mg/dL Final  . Total Protein 12/05/2016 6.4* 6.5 - 8.1 g/dL Final  . Albumin 12/05/2016 4.2  3.5 - 5.0 g/dL Final  . AST 12/05/2016 23  15 - 41 U/L Final  . ALT 12/05/2016 16* 17 - 63 U/L Final  . Alkaline Phosphatase 12/05/2016 56  38 - 126 U/L Final  . Total Bilirubin 12/05/2016 0.7  0.3 - 1.2 mg/dL Final  . GFR calc non Af Amer 12/05/2016 57* >60 mL/min Final  . GFR calc Af Amer 12/05/2016 >60  >60 mL/min Final   Comment: (NOTE) The eGFR has  been calculated using the CKD EPI equation. This calculation has not been validated in all clinical situations. eGFR's persistently <60 mL/min  signify possible Chronic Kidney Disease.   . Anion gap 12/05/2016 6  5 - 15 Final  . WBC 12/05/2016 7.6  3.8 - 10.6 K/uL Final  . RBC 12/05/2016 2.96* 4.40 - 5.90 MIL/uL Final  . Hemoglobin 12/05/2016 11.3* 13.0 - 18.0 g/dL Final  . HCT 12/05/2016 32.1* 40.0 - 52.0 % Final  . MCV 12/05/2016 108.3* 80.0 - 100.0 fL Final  . MCH 12/05/2016 38.0* 26.0 - 34.0 pg Final  . MCHC 12/05/2016 35.1  32.0 - 36.0 g/dL Final  . RDW 12/05/2016 12.2  11.5 - 14.5 % Final  . Platelets 12/05/2016 139* 150 - 440 K/uL Final  . Neutrophils Relative % 12/05/2016 64  % Final  . Neutro Abs 12/05/2016 4.9  1.4 - 6.5 K/uL Final  . Lymphocytes Relative 12/05/2016 23  % Final  . Lymphs Abs 12/05/2016 1.8  1.0 - 3.6 K/uL Final  . Monocytes Relative 12/05/2016 10  % Final  . Monocytes Absolute 12/05/2016 0.7  0.2 - 1.0 K/uL Final  . Eosinophils Relative 12/05/2016 3  % Final  . Eosinophils Absolute 12/05/2016 0.2  0 - 0.7 K/uL Final  . Basophils Relative 12/05/2016 0  % Final  . Basophils Absolute 12/05/2016 0.0  0 - 0.1 K/uL Final  . Magnesium 12/05/2016 1.8  1.7 - 2.4 mg/dL Final    Assessment:  Logan Perez is a 71 y.o. male with stage III mutiple myeloma.  He initially presented with progressive back pain beginning in 12/2006.  MRI revealed "spots and compression fractures".  He began Velcade, thalidomide, and Decadron.  In 08/2007, he underwent high dose chemotherapy and autologous stem cell transplant.  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  He recurred with a rising M-spike (2.7) with repeat M spike (1.7 gm/dl) in 03/2010.  He was initially treated with Velcade (02/08/2010 - 05/10/2010).  He then began Revlimid (15 mg 3 weeks on/1 week off) and Decadron (40 mg on day 1, 8, 15, 22).  Because of significant side effect with Decadron his dose was decreased to 10 mg once a week in 07/2010.    He was on maintenance Revlimid. Revlimid was initially10 mg 3 weeks on/1 week off.  This was changed to 10 mg 2 weeks on/2 weeks off secondary to right nipple tenderness. His dose was increased to 10 mg 3 weeks on/1 week off with Decadron 10 mg a week (on Sundays) and then Revlamid 15 mg 3 weeks on and 1 week off with Decadron on Sundays.  He began Pomalyst 4 mg 3 weeks on/1 week off with Decadron on 08/27/2015.  Over the past year his SPEP has revealed no monoclonal protein (04/21/2015) and 0.5 gm/dL on 09/22/2015 and 10/20/2015. M spike was 0.1 on 02/02/2016, 03/01/2016, 03/29/2016, 04/26/2016, and 05/24/2016.  M spike was 0 on 10/11/2016 and 11/28/2016.  Free light chains have been monitored. Kappa free light chains were 18.54 on 11/21/2013, 18.37 on 02/20/2014, 18.93 on 05/22/2014, 32.58 (high; normal ratio 1.27) on 08/21/2014, 51.53 (high; elevated ratio of 2.12) on 11/13/2014, 28.08 (ratio 1.73) on 12/09/2014, 23.71 (ratio 2.17) on 01/18/2015, 92.93 (ratio 9.49) on 04/21/2015, 93.44 (ratio 10.28) on 05/26/2015, 255.45 (ratio of 24.05) on 07/14/2015, 373.89 (ratio 48.31) on 08/04/2015, 474.33 (ratio 70.58) on 08/25/2015, 450.76 (ratio 55.44) on 09/22/2015, 453.4 (ratio 59.89) on 10/20/2015, 58.26 (ratio of > 40.74) on 02/02/2016, 62.67 (ratio of  37.98) on 03/01/2016, 75.7 (ratio > 50.47) on 03/29/2016, 79.7 (ratio 53.13) on 04/26/2016, 108.6 (ratio > 72.4) on 05/24/2016, and 8.3 (1.46 ratio) on 10/11/2016.  Bone survey on 12/08/2014 was stable.  Bone survey on 10/21/2015 revealed increase conspicuity of subcentimeter lytic lesions in the calvarium.  Bone marrow aspirate and biopsy on 11/04/2015 revealed an atypical monoclonal plasma cells estimated at 30-40% of marrow cells.   Marrow was variably cellular (approximately 45%) with background trilineage hematopoiesis. There was no significant increase in marrow reticulin fibers. Storage iron was present.    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 He develoed herpes zoster in  04/2008. He developed a pulmonary embolism in 05/2013. He was initially on Xarelto, but is now on Eliquis. He had an episode of pneumonia around this time requiring a brief admission. He developed severe lower leg cramps on 08/07/2014 secondary to hypokalemia. Duplex was negative.   He was treated for C difficile colitis (Flagyl completed 07/30/2015).  He has a chronic indwelling pain pump.  He received 4 cycles of Pomalyst and Decadron (08/27/2015 - 11/19/2015).  Restaging studies document progressive disease.  Kappa free light chains are increasing.  SPEP revealed 0.5 gm/dL monoclonal protein then 1.3 gm/dL.  Bone survey reveals increase conspicuity of subcentimeter lytic lesions in the calvarium.  Bone marrow reveals 30-40% plasma cells.   MUGA on 11/30/2015 revealed an ejection fraction of 46%.  He is felt not to be a good candidate for Kyprolis.  There were no focal wall motion abnormalities.  He had a stress echo less than 1 year ago.  He has a history of PVCs and atrial fibrillation.  He takes Cardizem prn.  He is s/p 17 weeks of daratumumab (Darzalex) (12/09/2015 - 05/25/2016).  He tolerated treatment well without side effect.  Bone marrow on 02/09/2016 revealed no diagnostic morphologic evidence of plasma cell myeloma.  Marrow was normocellular to hypocellular marrow for age (ranging from 10-40%) with maturing trilineage hematopoiesis and mild multilineage dyspoiesis.  There was patchy mild increase in reticulin.  Storage iron was present.  Flow cytometry revealed no definitive evidence of monoclonality.  There was a non-specific atypical myeloid and monocytic findings with no increase in blasts.  Cytogenetics were normal (46, XY).  He is currently day 161 s/p 2nd autologous stem cell transplant on 06/16/2016.  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  He also experienced atrial fibrillation with intermittent episodes of RVR requiring IV  beta blockers.  He is on prophylactic valacyclovir for 1 year post transplant.  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  Work-up on 08/23/2016 revealed the following normal studies:  B12 and folate.  Ferritin was 1379 (high).  Iron studies included a saturation of 56% and TIBC 141 (low).  ESR was 64.  Thrombocytopenia is improving slowly post transplant.  Platelet count is 149,000.  He has persistent hypomagnesemia and receives IV magnesium (2-4 gm) weekly.  He has not required magnesium since 10/24/2016.   Plan:  1.  Labs today:  CBC with diff, CMP, Mg, SPEP. 2.  No IVF or magnesium needed today. 3.  Port-a-cath placed yesterday without significant problem. 4.  Proceed with monthly daratumumab 16 mg/kg today. 5.  Previously discussed potential allergic reaction to dartumumab.  Review Singular day before, day of, and 2 days after.  Discuss Decadron 16 mg at least 1 hour prior to treatment and 4 mg on  day 2 and 3.  Premedications:  Ondansetron 8 mg IV, Benadryl 50 mg po, and Tylenol 650 mg po. 6.  RTC in 1 month for MD assessment, labs (CBC with diff, CMP, Mg), and cycle #2 daratumumab 16 mg/kg.   Lloyd Huger, MD  12/08/2016, 11:27 AM

## 2016-12-09 ENCOUNTER — Other Ambulatory Visit: Payer: Self-pay | Admitting: Hematology and Oncology

## 2016-12-11 ENCOUNTER — Other Ambulatory Visit: Payer: Self-pay | Admitting: Hematology and Oncology

## 2016-12-11 ENCOUNTER — Ambulatory Visit: Payer: Medicare Other | Admitting: Physical Therapy

## 2016-12-11 DIAGNOSIS — R293 Abnormal posture: Secondary | ICD-10-CM

## 2016-12-11 DIAGNOSIS — R262 Difficulty in walking, not elsewhere classified: Secondary | ICD-10-CM

## 2016-12-11 DIAGNOSIS — M6281 Muscle weakness (generalized): Secondary | ICD-10-CM | POA: Diagnosis not present

## 2016-12-11 DIAGNOSIS — R2689 Other abnormalities of gait and mobility: Secondary | ICD-10-CM

## 2016-12-11 NOTE — Therapy (Signed)
[facility] [address] [place], [place], [address] Phone: [phone]   Fax:  [phone]  Physical Therapy Treatment  Patient Details  Name: Logan Perez MRN: [id number] Date of Birth: 1946/01/08 Referring Provider: Nolon Stalls, MD  Encounter Date: 12/11/2016      PT End of Session - 12/11/16 0953    Visit Number 14   Number of Visits 15   Date for PT Re-Evaluation 12/13/16   Authorization - Visit Number 14   Authorization - Number of Visits 17   PT Start Time 0945   PT Stop Time 1033   PT Time Calculation (min) 48 min   Equipment Utilized During Treatment Gait belt   Activity Tolerance Patient tolerated treatment well   Behavior During Therapy [facility] for tasks assessed/performed      Past Medical History:  Diagnosis Date  . Anxiety   . Atrial fibrillation ([place])   . BPH (benign prostatic hyperplasia)   . Complication of anesthesia    BAD HEADACHE NIGHT OF FIRST CATARACT  . Difficulty voiding   . Dysrhythmia    A FIB  . Elevated PSA   . GERD (gastroesophageal reflux disease)   . HLD (hyperlipidemia)   . HOH (hard of hearing)   . Hypertension   . Multiple myeloma ([place])   . Neuropathy ([place])   . Pain    BACK  . Palpitations   . Pneumonia   . Pulmonary embolism ([place])   . Stroke [facility])    TIA    Past Surgical History:  Procedure Laterality Date  . BACK SURGERY  1994  . CATARACT EXTRACTION W/PHACO Right 05/02/2016   Procedure: CATARACT EXTRACTION PHACO AND INTRAOCULAR LENS PLACEMENT (IOC);  Surgeon: Birder Robson, MD;  Location: [facility] ORS;  Service: Ophthalmology;  Laterality: Right;  [place] 1.06 AP% 20.6 CDE 13.70 FLUID PACK LOT # [id number] H  . CATARACT EXTRACTION W/PHACO Left 05/16/2016   Procedure: CATARACT EXTRACTION PHACO AND INTRAOCULAR LENS PLACEMENT (IOC);  Surgeon: Birder Robson, MD;  Location: [facility] ORS;  Service: Ophthalmology;  Laterality: Left;  [place] 01:43 AP% 19.8 CDE 20.45 FLUID PACK LOT  #[id number] H  . KNEE ARTHROSCOPY Left 1992  . LIMBAL STEM CELL TRANSPLANT    . PAIN PUMP IMPLANTATION  2012  . PORTA CATH INSERTION N/A 12/04/2016   Procedure: Glori Luis Cath Insertion;  Surgeon: Algernon Huxley, MD;  Location: [facility];  Service: Cardiovascular;  Laterality: N/A;  . stem cell implant  2008   [facility]    There were no vitals filed for this visit.      Subjective Assessment - 12/11/16 0952    Subjective Patient has been walking and had no falls or LOB since last visit. He is on one medication, a cancer maintenance drug. He reports his eyesight has improved.    Patient is accompained by: Family member   Pertinent History Pt. has multiple myeloma (dx 2008.) Pt. was unable to walk for a while afterward but did PT for ~6 mo. after. Pt states cancer went into remission, no meds 2008-2011, cancer returned 2011/2012. Hx of 4 compression fx. Took chemo drug for 5 years which was beneficial, not currently taking any chemo. Sept 2017 pt had 2nd stem cell transplant, recently pt has returned to going out in public. Before stem cell transplant, pt reports he could walk a mile, but now only walks to mailbox and back since he has lost his endurance. Pt progressed well with home health PT going  up and down stairs. Reports 3/10 low back pain and takes an Aleve in the a.m.  Pt has a pain pump for spine for daily use.    Currently in Pain? No/denies     SciFit Lvl6 10 minutes (no charge)  TherEx  Wall squats 1x10. Standing LE stretches at edge of TM TG squats 1x10, 1x10 pause squats 3 second holds, Single leg squats 1x10 each leg, calf raises 20x. Cues for body mechanics. Tactile cueing for patellar linearity for single leg squats.  Heel and toe raises 1x10 Single leg heel raises 1x10 each leg.  Sit to stand x10 with no UE assistance, pt. Attempted cheating with pushing off knees on #7.     Neuro: Airex pad: standing marches 2x60 seconds Star taps 6x cues for verbal  Cone tap 3 cones with  occasional lateral LOB 10x Crane partial lunges w UE support 1x10 CGA to keep balance. Occasional loss of balance.     Pt response for medical necessity: Pt. Will continue to benefit from skilled PT in order to keep progressing LE strengthening,balance training, and capacity for functional activities of daily life.        PT Long Term Goals - 11/21/16 1058      PT LONG TERM GOAL #1   Title Pt. will score a 56/56 on the Berg Balance Assessment in order to improve functional mobility and balance.    Baseline Ongoing,  Pt scored a 54/56 10/19/15.  Pt scored a 55/56 on 2/13/`17.   Time 4   Period Weeks   Status On-going     PT LONG TERM GOAL #2   Title Pt will demonstrate a normalized gait pattern with a wider base of support in order to improve overall mobility.    Baseline Patient is ambulating with a functional base of support, improving his confidence in ambulating.    Time 4   Period Weeks   Status Achieved     PT LONG TERM GOAL #3   Title Pt will ambulate for 10 minutes safely without fatigue in order to increase endurance while in the community.    Baseline Patient ambulates 10 minutes safely without fatigue.    Time 4   Period Weeks   Status Achieved     PT LONG TERM GOAL #4   Title Pt. will participate in a gym-based exercise program with no limitations to promote independence.    Baseline Pt. would like to return to gym to resume exercising regularly   Time 4   Period Weeks   Status On-going     PT LONG TERM GOAL #5   Title Pt will ambulate for 20 minutes safely without fatigue in order to increase endurance while in the community and walking dog.    Baseline Patient can ambulate 10 minutes safetly without fatigue on 2/8   Time 4   Period Weeks   Status New     PT LONG TERM GOAL #6   Title Patient will squat to pick up object from floor and return to standing 10x without LOB to increase safety with activities of daily living.   Baseline Patient can squat to  pick up object 6 inches from ground and return to standing 10x with 2 episodes of LOB on 2/8   Time 4   Period Weeks   Status New     PT LONG TERM GOAL #7   Title Patient will complete 10 step ups onto 6" step without UE assist or LOB to  increase mobility in the community.    Baseline Patient has lateral LOB with step ups and occasionally requires UE assistance when fatigued.    Time 4   Period Weeks   Status New            Plan - 12/11/16 1511    Clinical Impression Statement Pt. presents to therapy today with increased fatigue, requiring more frequent rest breaks. Dynamic single limb balance continues to challenge patient and created fatigue. LE strength continues to improve and pt. was able to perform pause squats on total gym with good bodymechanics and form. Single limb calf raises were challenging to patient but able to be performed with encouragement. Patient will benefit from continued skilled physical therapy for generalized strengthening, capacity for functional activities, balance, and community mobility.    Rehab Potential Good   Clinical Impairments Affecting Rehab Potential motivation, active lifestyle, family support.    PT Frequency 2x / week   PT Duration 4 weeks   PT Treatment/Interventions Gait training;Functional mobility training;Therapeutic exercise;Balance training;Neuromuscular re-education   PT Next Visit Plan resisted walking, single limb balance activity, russian deadlift   PT Home Exercise Plan already issued    Consulted and Agree with Plan of Care Patient      Patient will benefit from skilled therapeutic intervention in order to improve the following deficits and impairments:  Abnormal gait, Postural dysfunction, Decreased activity tolerance, Decreased endurance, Decreased strength, Decreased balance, Difficulty walking  Visit Diagnosis: Muscle weakness (generalized)  Difficulty in walking, not elsewhere classified  Imbalance  Abnormal  posture     Problem List Patient Active Problem List   Diagnosis Date Noted  . Multiple myeloma in remission ([place]) 09/19/2016  . Xerostomia 08/23/2016  . Moderate protein-calorie malnutrition ([place]) 08/20/2016  . Thrombocytopenia ([place]) 08/20/2016  . Anemia 08/20/2016  . Nausea without vomiting 08/20/2016  . Atrial fibrillation ([place]) 07/10/2016  . 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 H/O autologous stem cell transplant ([place]) 07/10/2016  . Bone cancer ([place]) 06/15/2016  . Porokeratosis 04/06/2016  . Encounter for adjustment or management of infusion pump 03/23/2016  . Hypomagnesemia 12/01/2015  . Ejection fraction < 50% 11/30/2015  . Chronic pain 08/26/2015  . Presence of intrathecal pump 08/26/2015  . Long term current use of opiate analgesic 08/26/2015  . Long term prescription opiate use 08/26/2015  . Opiate use 08/26/2015  . Encounter for therapeutic drug level monitoring 08/26/2015  . Night muscle spasms 08/26/2015  . Muscle spasticity 08/26/2015  . Neuropathic pain 08/26/2015  . Neurogenic pain 08/26/2015  . Chronic low back pain 08/26/2015  . Lumbar spondylosis 08/26/2015  . Compression fracture of T12 vertebra (HCC) (70-75% magnitude) (with mild retropulsion) 08/26/2015  . Diffuse myofascial pain syndrome 08/26/2015  . Presence of implanted infusion pump (Medtronic, programmable, intrathecal pump) 08/26/2015  . Cancer associated pain 08/26/2015  . Encounter for interrogation of infusion pump 08/26/2015  . Chronic lumbar radicular pain 08/26/2015  . Opiate analgesic contract exists 08/26/2015  . Chronic pain syndrome 08/26/2015  . Chronic anticoagulation (Eliquis) 08/26/2015  . Clostridium difficile diarrhea 07/21/2015  . Diarrhea 07/19/2015  . Multiple myeloma ([place]) 05/04/2015  . BPH with obstruction/lower urinary tract symptoms 04/29/2015  . MI (mitral incompetence) 12/03/2014  . TI (tricuspid incompetence) 12/03/2014  .  Paroxysmal atrial fibrillation ([place]) 12/03/2014  . Breathlessness on exertion 12/03/2014  . Essential (primary) hypertension 01/29/2014  . Acid reflux 01/29/2014  . Combined fat and carbohydrate induced hyperlipemia 01/29/2014  . Pulmonary embolism (  [place]) 01/29/2014   Pura Spice, PT, DPT # [id number] Janna Arch, SPT 12/11/2016, 3:17 PM  [facility] [address] [place], [place], [address] Phone: [phone]   Fax:  [phone]  Name: Rhyan Radler MRN: [id number] Date of Birth: 12/12/1945

## 2016-12-13 ENCOUNTER — Telehealth: Payer: Self-pay

## 2016-12-13 NOTE — Telephone Encounter (Signed)
Patient called requesting a pain cream that you and he dicussed at the time of his port-a cath placement? He is wanting the cream because he is experiencing pain on his dialysis days.

## 2016-12-13 NOTE — Telephone Encounter (Signed)
Patient was given Darzalex from 3/17 through 8/17 and then received a stem cell transplant.  Patient restarted on Darzalex 12/05/16.  Per MD notes, to be on maintenance dose of 16mg /kg monthly.  Tried to contact MD regarding verification of dosing since there is no information regarding restarting Darzalex, whether the restart should be weekly versus monthly.First dose was given as an initial restart per on call physician.  No information has been verified yet.

## 2016-12-14 ENCOUNTER — Ambulatory Visit: Payer: Medicare Other | Admitting: Physical Therapy

## 2016-12-14 DIAGNOSIS — R2689 Other abnormalities of gait and mobility: Secondary | ICD-10-CM

## 2016-12-14 DIAGNOSIS — R262 Difficulty in walking, not elsewhere classified: Secondary | ICD-10-CM

## 2016-12-14 DIAGNOSIS — R293 Abnormal posture: Secondary | ICD-10-CM

## 2016-12-14 DIAGNOSIS — M6281 Muscle weakness (generalized): Secondary | ICD-10-CM

## 2016-12-14 NOTE — Therapy (Signed)
[facility] [address] [place], [place], [address] Phone: [phone]   Fax:  [phone]  Physical Therapy Treatment  Patient Details  Name: Logan Perez MRN: [id number] Date of Birth: 1946-09-09 Referring Provider: Nolon Stalls, MD  Encounter Date: 12/14/2016      PT End of Session - 12/14/16 1248    Visit Number 15   Number of Visits 23   Date for PT Re-Evaluation 01/11/17   Authorization - Visit Number 15   Authorization - Number of Visits 24   PT Start Time [phone]   PT Stop Time 1029   PT Time Calculation (min) 51 min   Equipment Utilized During Treatment Gait belt   Activity Tolerance Patient tolerated treatment well   Behavior During Therapy [facility] for tasks assessed/performed      Past Medical History:  Diagnosis Date  . Anxiety   . Atrial fibrillation ([place])   . BPH (benign prostatic hyperplasia)   . Complication of anesthesia    BAD HEADACHE NIGHT OF FIRST CATARACT  . Difficulty voiding   . Dysrhythmia    A FIB  . Elevated PSA   . GERD (gastroesophageal reflux disease)   . HLD (hyperlipidemia)   . HOH (hard of hearing)   . Hypertension   . Multiple myeloma ([place])   . Neuropathy ([place])   . Pain    BACK  . Palpitations   . Pneumonia   . Pulmonary embolism ([place])   . Stroke [facility])    TIA    Past Surgical History:  Procedure Laterality Date  . BACK SURGERY  1994  . CATARACT EXTRACTION W/PHACO Right 05/02/2016   Procedure: CATARACT EXTRACTION PHACO AND INTRAOCULAR LENS PLACEMENT (IOC);  Surgeon: Birder Robson, MD;  Location: [facility];  Service: Ophthalmology;  Laterality: Right;  [place] 1.06 AP% 20.6 CDE 13.70 FLUID PACK LOT # [id number] H  . CATARACT EXTRACTION W/PHACO Left 05/16/2016   Procedure: CATARACT EXTRACTION PHACO AND INTRAOCULAR LENS PLACEMENT (IOC);  Surgeon: Birder Robson, MD;  Location: [facility];  Service: Ophthalmology;  Laterality: Left;  [place] 01:43 AP% 19.8 CDE 20.45 FLUID PACK LOT  #[id number] H  . KNEE ARTHROSCOPY Left 1992  . LIMBAL STEM CELL TRANSPLANT    . PAIN PUMP IMPLANTATION  2012  . PORTA CATH INSERTION N/A 12/04/2016   Procedure: Glori Luis Cath Insertion;  Surgeon: Algernon Huxley, MD;  Location: [facility];  Service: Cardiovascular;  Laterality: N/A;  . stem cell implant  2008   [facility]    There were no vitals filed for this visit.      Subjective Assessment - 12/14/16 0939    Subjective Patient is on second week of medical tx and has been doing well. He still has ocassional chronic back pain, no falls or LOB though notes that he has difficulty standing on R leg.    Patient is accompained by: Family member   Pertinent History Pt. has multiple myeloma (dx 2008.) Pt. was unable to walk for a while afterward but did PT for ~6 mo. after. Pt states cancer went into remission, no meds 2008-2011, cancer returned 2011/2012. Hx of 4 compression fx. Took chemo drug for 5 years which was beneficial, not currently taking any chemo. Sept 2017 pt had 2nd stem cell transplant, recently pt has returned to going out in public. Before stem cell transplant, pt reports he could walk a mile, but now only walks to mailbox and back since he has lost his endurance. Pt progressed  well with home health PT going up and down stairs. Reports 3/10 low back pain and takes an Aleve in the a.m.  Pt has a pain pump for spine for daily use.    Pain Score 2    Pain Location Back   Pain Orientation Lower     Neuro: BERG- 55/56 Crane step up and over 6" step 5x, more difficult to perform on right, tired after 4 tries.  Pick up cone from ground 10x. 10 step ups 6" step each leg, no UE assist Tandem steps in // bars with heel to toe, and with space between 5x Long steps/marching 10x  Airex pad: 30 seconds x2, weight shift x2   Gait Treadmill 2-2-3.3 10 minutes=.49 miles with frequent scuffing of shoes. Cues for step length, use of heel strike, upright posture, altering speed based on patient  performance.     Pt response for medical necessity: Pt. Will continue to benefit from skilled PT in order to keep progressing LE strengthening,balance training, and capacity for functional activities of daily life.        PT Long Term Goals - 12/14/16 1000      PT LONG TERM GOAL #1   Title Pt. will score a 56/56 on the Berg Balance Assessment in order to improve functional mobility and balance.    Baseline Ongoing,  Pt scored a 54/56 10/19/15.  Pt scored a 55/56 on 2/13/`17., 3/8: 55/56   Time 4   Period Weeks   Status On-going     PT LONG TERM GOAL #2   Title Pt will demonstrate a normalized gait pattern with a wider base of support in order to improve overall mobility.    Baseline Patient is ambulating with a functional base of support, improving his confidence in ambulating.    Time 4   Period Weeks   Status Achieved     PT LONG TERM GOAL #3   Title Pt will ambulate for 10 minutes safely without fatigue in order to increase endurance while in the community.    Baseline Patient ambulates 10 minutes safely without fatigue.    Time 4   Period Weeks   Status Achieved     PT LONG TERM GOAL #4   Title Pt. will participate in a gym-based exercise program with no limitations to promote independence.    Baseline Patient unable to return to gym at this time due to fear of exposure while on medical tx due to low immune system.    Time 4   Period Weeks   Status On-going     PT LONG TERM GOAL #5   Title Pt will ambulate for 20 minutes safely without fatigue in order to increase endurance while in the community and walking dog.    Baseline 10 minutes on treadmill level 2.2-3.2    Time 4   Period Weeks   Status On-going     Additional Long Term Goals   Additional Long Term Goals Yes     PT LONG TERM GOAL #6   Title Patient will squat to pick up object from floor and return to standing 10x without LOB to increase safety with activities of daily living.   Baseline squat to pick up  cone off ground 10x no LOB   Time 4   Period Weeks   Status Achieved     PT LONG TERM GOAL #7   Title Patient will complete 10 step ups onto 6" step without UE assist or LOB to  increase mobility in the community.    Baseline Step up 10x on 6" step without UE assistance on left (difficulty on right)   Time 4   Period Weeks   Status Achieved     PT LONG TERM GOAL #8   Title Patient will perform single limb stance for 15 seconds on RLE.    Baseline 12/29/2022: 6 seconds    Time 4   Period Weeks   Status New     PT LONG TERM GOAL  #9   TITLE Patient will perform tandem stance for 15 seconds without LOB with one foot in front of the other touching.    Baseline Patient unable to have one foot in front of the other touching, only with some space between.    Time 4   Period Weeks   Status New            Plan - December 28, 2016 1254    Clinical Impression Statement Patient presents to physical therapy session with increased fatigue. Merrilee Jansky was performed and scored a 55/56 with patient able to complete with left leg, however single limb stance with right is challenging as well as tandem stance.  A cone was picked up from the floor ten times with good body mechanics. Step ups 10x with left leg was achieved on LLE with no UE assistance but was challenging with RLE. Patient has had increased fatigue and decreased balance due to recent initialization of new cancer maintenance medications. RLE is weaker than left and patient has difficulty balancing with it. Treadmill was implemented with a speed of 2.2-3.2 for 10 minutes until pt. fatigued with frequent scuffing of feet. Patient will benefit from continued skilled physical therapy to improve LE strength, balance, safety while ambulating, and capacity for functional activities to improve community mobility and quality of life.    Rehab Potential Good   Clinical Impairments Affecting Rehab Potential motivation, active lifestyle, family support.    PT Frequency 2x  / week   PT Duration 4 weeks   PT Treatment/Interventions Gait training;Functional mobility training;Therapeutic exercise;Balance training;Neuromuscular re-education   PT Next Visit Plan NEW HEP   PT Home Exercise Plan already issued    Consulted and Agree with Plan of Care Patient      Patient will benefit from skilled therapeutic intervention in order to improve the following deficits and impairments:  Abnormal gait, Postural dysfunction, Decreased activity tolerance, Decreased endurance, Decreased strength, Decreased balance, Difficulty walking  Visit Diagnosis: Muscle weakness (generalized)  Difficulty in walking, not elsewhere classified  Imbalance  Abnormal posture       G-Codes - 2016/12/28 1428    Functional Assessment Tool Used (Outpatient Only) Clinical impression/ muscle weakness/ Berg Balance Assessment   Functional Limitation Mobility: Walking and moving around   Mobility: Walking and Moving Around Current Status [phone]) At least 1 percent but less than 20 percent impaired, limited or restricted   Mobility: Walking and Moving Around Goal Status [phone]) 0 percent impaired, limited or restricted      Problem List Patient Active Problem List   Diagnosis Date Noted  . Multiple myeloma in remission ([place]) 09/19/2016  . Xerostomia 08/23/2016  . Moderate protein-calorie malnutrition ([place]) 08/20/2016  . Thrombocytopenia ([place]) 08/20/2016  . Anemia 08/20/2016  . Nausea without vomiting 08/20/2016  . Atrial fibrillation ([place]) 07/10/2016  . 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 H/O autologous stem cell transplant ([place]) 07/10/2016  . Bone cancer ([place]) 06/15/2016  . Porokeratosis 04/06/2016  .  Encounter for adjustment or management of infusion pump 03/23/2016  . Hypomagnesemia 12/01/2015  . Ejection fraction < 50% 11/30/2015  . Chronic pain 08/26/2015  . Presence of intrathecal pump 08/26/2015  . Long term current use of opiate  analgesic 08/26/2015  . Long term prescription opiate use 08/26/2015  . Opiate use 08/26/2015  . Encounter for therapeutic drug level monitoring 08/26/2015  . Night muscle spasms 08/26/2015  . Muscle spasticity 08/26/2015  . Neuropathic pain 08/26/2015  . Neurogenic pain 08/26/2015  . Chronic low back pain 08/26/2015  . Lumbar spondylosis 08/26/2015  . Compression fracture of T12 vertebra (HCC) (70-75% magnitude) (with mild retropulsion) 08/26/2015  . Diffuse myofascial pain syndrome 08/26/2015  . Presence of implanted infusion pump (Medtronic, programmable, intrathecal pump) 08/26/2015  . Cancer associated pain 08/26/2015  . Encounter for interrogation of infusion pump 08/26/2015  . Chronic lumbar radicular pain 08/26/2015  . Opiate analgesic contract exists 08/26/2015  . Chronic pain syndrome 08/26/2015  . Chronic anticoagulation (Eliquis) 08/26/2015  . Clostridium difficile diarrhea 07/21/2015  . Diarrhea 07/19/2015  . Multiple myeloma ([place]) 05/04/2015  . BPH with obstruction/lower urinary tract symptoms 04/29/2015  . MI (mitral incompetence) 12/03/2014  . TI (tricuspid incompetence) 12/03/2014  . Paroxysmal atrial fibrillation ([place]) 12/03/2014  . Breathlessness on exertion 12/03/2014  . Essential (primary) hypertension 01/29/2014  . Acid reflux 01/29/2014  . Combined fat and carbohydrate induced hyperlipemia 01/29/2014  . Pulmonary embolism ([place]) 01/29/2014   Pura Spice, PT, DPT # [id number] Janna Arch, SPT 12/14/2016, 2:28 PM  [facility] [address] [place], [place], [address] Phone: [phone]   Fax:  [phone]  Name: Logan Perez MRN: [id number] Date of Birth: [birth year]

## 2016-12-18 ENCOUNTER — Other Ambulatory Visit: Payer: Self-pay

## 2016-12-18 ENCOUNTER — Telehealth: Payer: Self-pay

## 2016-12-18 DIAGNOSIS — M62838 Other muscle spasm: Secondary | ICD-10-CM

## 2016-12-18 MED ORDER — BACLOFEN 10 MG PO TABS: 10.0000 mg | ORAL_TABLET | Freq: Every day | ORAL | 3 refills | Status: DC

## 2016-12-18 NOTE — Telephone Encounter (Signed)
Pt does not have an appointment until March 27 for pump pt has ran out of baclofen and wants to know will Dr Dossie Arbour write him a script for a 15 day supply? I told him Dr Lowella Dandy may not do that but if he does not we will have to reschedule for something earlier. Can We just put him in an earlier pump spot and he get his meds on that day as well? Let me or Juliann Pulse know... Thanks

## 2016-12-19 ENCOUNTER — Ambulatory Visit: Payer: Medicare Other | Admitting: Physical Therapy

## 2016-12-19 ENCOUNTER — Encounter: Payer: Self-pay | Admitting: Physical Therapy

## 2016-12-19 DIAGNOSIS — R262 Difficulty in walking, not elsewhere classified: Secondary | ICD-10-CM

## 2016-12-19 DIAGNOSIS — M6281 Muscle weakness (generalized): Secondary | ICD-10-CM

## 2016-12-19 DIAGNOSIS — R293 Abnormal posture: Secondary | ICD-10-CM

## 2016-12-19 DIAGNOSIS — R2689 Other abnormalities of gait and mobility: Secondary | ICD-10-CM

## 2016-12-19 NOTE — Therapy (Addendum)
[facility] [address]. [place], [place], [address] Phone: [phone]   Fax:  [phone]  Physical Therapy Treatment  Patient Details  Name: Logan Perez MRN: [id number] Date of Birth: 08/08/1946 Referring Provider: Nolon Stalls, MD  Encounter Date: 12/19/2016      PT End of Session - 12/19/16 1047    Visit Number 16   Number of Visits 23   Date for PT Re-Evaluation 01/11/17   Authorization - Visit Number 106   Authorization - Number of Visits 24   PT Start Time 3612   PT Stop Time 0956   PT Time Calculation (min) 59 min   Equipment Utilized During Treatment Gait belt   Activity Tolerance Patient tolerated treatment well   Behavior During Therapy [facility] for tasks assessed/performed      Past Medical History:  Diagnosis Date  . Anxiety   . Atrial fibrillation ([place])   . BPH (benign prostatic hyperplasia)   . Complication of anesthesia    BAD HEADACHE NIGHT OF FIRST CATARACT  . Difficulty voiding   . Dysrhythmia    A FIB  . Elevated PSA   . GERD (gastroesophageal reflux disease)   . HLD (hyperlipidemia)   . HOH (hard of hearing)   . Hypertension   . Multiple myeloma ([place])   . Neuropathy ([place])   . Pain    BACK  . Palpitations   . Pneumonia   . Pulmonary embolism ([place])   . Stroke [facility])    TIA    Past Surgical History:  Procedure Laterality Date  . BACK SURGERY  1994  . CATARACT EXTRACTION W/PHACO Right 05/02/2016   Procedure: CATARACT EXTRACTION PHACO AND INTRAOCULAR LENS PLACEMENT (IOC);  Surgeon: Birder Robson, MD;  Location: [facility] ORS;  Service: Ophthalmology;  Laterality: Right;  [place] 1.06 AP% 20.6 CDE 13.70 FLUID PACK LOT # [id number] H  . CATARACT EXTRACTION W/PHACO Left 05/16/2016   Procedure: CATARACT EXTRACTION PHACO AND INTRAOCULAR LENS PLACEMENT (IOC);  Surgeon: Birder Robson, MD;  Location: [facility] ORS;  Service: Ophthalmology;  Laterality: Left;  [place] 01:43 AP% 19.8 CDE 20.45 FLUID PACK LOT  #[id number] H  . KNEE ARTHROSCOPY Left 1992  . LIMBAL STEM CELL TRANSPLANT    . PAIN PUMP IMPLANTATION  2012  . PORTA CATH INSERTION N/A 12/04/2016   Procedure: Glori Luis Cath Insertion;  Surgeon: Algernon Huxley, MD;  Location: [facility];  Service: Cardiovascular;  Laterality: N/A;  . stem cell implant  2008   [facility]    There were no vitals filed for this visit.      Subjective Assessment - 12/19/16 0847    Subjective Patient has been doing well and continues to walk his dog, however walks are slightly shorter at this time. No LOB since last visit, has been very careful due to the black ice.    Patient is accompained by: Family member   Pertinent History Pt. has multiple myeloma (dx 2008.) Pt. was unable to walk for a while afterward but did PT for ~6 mo. after. Pt states cancer went into remission, no meds 2008-2011, cancer returned 2011/2012. Hx of 4 compression fx. Took chemo drug for 5 years which was beneficial, not currently taking any chemo. Sept 2017 pt had 2nd stem cell transplant, recently pt has returned to going out in public. Before stem cell transplant, pt reports he could walk a mile, but now only walks to mailbox and back since he has lost his endurance. Pt progressed well  with home health PT going up and down stairs. Reports 3/10 low back pain and takes an Aleve in the a.m.  Pt has a pain pump for spine for daily use.    Currently in Pain? Yes   Pain Score 1    Pain Location Back   Pain Orientation Lower   Pain Descriptors / Indicators Aching      Treadmill .5 mile level 2.3-2.8  Neuro: See new HEP (balance tasks with focus on posture). Airex pad: 30 seconds x2, weight shift x2, marching with alt. UE and LE (cuing for upright posture).  Standing heel/toe raises on Airex pad (no UE assist).   Walking in hallway with eyes closed (PT CGA for safety).  Braiding to L/R in hallway with SBA/CGA for safety.   Tandem gait (forward/backwards).   There.ex.: Scifit L6 10 min. (B  UE/LE) TG 20x knee flexion with added shoulder flexion/ heel raises 20x/ single leg squats 5x2.   Standing squats with no UE assist (Airex)- 30x.  See HEP.      Pt response for medical necessity: Pt. Will continue to benefit from skilled PT in order to keep progressing LE strengthening,balance training, and capacity for functional activities of daily life.         PT Education - 12/19/16 0851    Education provided Yes   Education Details new HEP   Person(s) Educated Patient   Methods Explanation;Demonstration;Handout   Comprehension Verbalized understanding;Returned demonstration             PT Long Term Goals - 12/14/16 1000      PT LONG TERM GOAL #1   Title Pt. will score a 56/56 on the Berg Balance Assessment in order to improve functional mobility and balance.    Baseline Ongoing,  Pt scored a 54/56 10/19/15.  Pt scored a 55/56 on 2/13/`17., 3/8: 55/56   Time 4   Period Weeks   Status On-going     PT LONG TERM GOAL #2   Title Pt will demonstrate a normalized gait pattern with a wider base of support in order to improve overall mobility.    Baseline Patient is ambulating with a functional base of support, improving his confidence in ambulating.    Time 4   Period Weeks   Status Achieved     PT LONG TERM GOAL #3   Title Pt will ambulate for 10 minutes safely without fatigue in order to increase endurance while in the community.    Baseline Patient ambulates 10 minutes safely without fatigue.    Time 4   Period Weeks   Status Achieved     PT LONG TERM GOAL #4   Title Pt. will participate in a gym-based exercise program with no limitations to promote independence.    Baseline Patient unable to return to gym at this time due to fear of exposure while on medical tx due to low immune system.    Time 4   Period Weeks   Status On-going     PT LONG TERM GOAL #5   Title Pt will ambulate for 20 minutes safely without fatigue in order to increase endurance while in  the community and walking dog.    Baseline 10 minutes on treadmill level 2.2-3.2    Time 4   Period Weeks   Status On-going     Additional Long Term Goals   Additional Long Term Goals Yes     PT LONG TERM GOAL #6   Title Patient will  squat to pick up object from floor and return to standing 10x without LOB to increase safety with activities of daily living.   Baseline squat to pick up cone off ground 10x no LOB   Time 4   Period Weeks   Status Achieved     PT LONG TERM GOAL #7   Title Patient will complete 10 step ups onto 6" step without UE assist or LOB to increase mobility in the community.    Baseline Step up 10x on 6" step without UE assistance on left (difficulty on right)   Time 4   Period Weeks   Status Achieved     PT LONG TERM GOAL #8   Title Patient will perform single limb stance for 15 seconds on RLE.    Baseline 3/8: 6 seconds    Time 4   Period Weeks   Status New     PT LONG TERM GOAL  #9   TITLE Patient will perform tandem stance for 15 seconds without LOB with one foot in front of the other touching.    Baseline Patient unable to have one foot in front of the other touching, only with some space between.    Time 4   Period Weeks   Status New               Plan - 12/19/16 1048    Clinical Impression Statement Pt. continues to progress well with higher level balance tasks/ muscle endurance during tx. session.  Pt. has min. difficutly with tandem stance with L leg in back and during eyes closed tasks in hallway.  No LOB but occasional touch reference in //-bars with brading/ alt. UE and LE tasks.  Pt. will benefit from continued LE strengthening/ progression towards all goals to improve independence with daily functional mobility.   ABC scale:  96.25% (good confidence).     Rehab Potential Good   Clinical Impairments Affecting Rehab Potential motivation, active lifestyle, family support.    PT Frequency 2x / week   PT Duration 4 weeks   PT  Treatment/Interventions Gait training;Functional mobility training;Therapeutic exercise;Balance training;Neuromuscular re-education   PT Next Visit Plan Progress B LE muscle strength/ higher level balance tasks.     PT Home Exercise Plan See handouts   Consulted and Agree with Plan of Care Patient      Patient will benefit from skilled therapeutic intervention in order to improve the following deficits and impairments:  Abnormal gait, Postural dysfunction, Decreased activity tolerance, Decreased endurance, Decreased strength, Decreased balance, Difficulty walking  Visit Diagnosis: Muscle weakness (generalized)  Difficulty in walking, not elsewhere classified  Imbalance  Abnormal posture     Problem List Patient Active Problem List   Diagnosis Date Noted  . Multiple myeloma in remission ([place]) 09/19/2016  . Xerostomia 08/23/2016  . Moderate protein-calorie malnutrition ([place]) 08/20/2016  . Thrombocytopenia ([place]) 08/20/2016  . Anemia 08/20/2016  . Nausea without vomiting 08/20/2016  . Atrial fibrillation ([place]) 07/10/2016  . 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 H/O autologous stem cell transplant ([place]) 07/10/2016  . Bone cancer ([place]) 06/15/2016  . Porokeratosis 04/06/2016  . Encounter for adjustment or management of infusion pump 03/23/2016  . Hypomagnesemia 12/01/2015  . Ejection fraction < 50% 11/30/2015  . Chronic pain 08/26/2015  . Presence of intrathecal pump 08/26/2015  . Long term current use of opiate analgesic 08/26/2015  . Long term prescription opiate use 08/26/2015  . Opiate use 08/26/2015  . Encounter for  therapeutic drug level monitoring 08/26/2015  . Night muscle spasms 08/26/2015  . Muscle spasticity 08/26/2015  . Neuropathic pain 08/26/2015  . Neurogenic pain 08/26/2015  . Chronic low back pain 08/26/2015  . Lumbar spondylosis 08/26/2015  . Compression fracture of T12 vertebra (HCC) (70-75% magnitude) (with mild  retropulsion) 08/26/2015  . Diffuse myofascial pain syndrome 08/26/2015  . Presence of implanted infusion pump (Medtronic, programmable, intrathecal pump) 08/26/2015  . Cancer associated pain 08/26/2015  . Encounter for interrogation of infusion pump 08/26/2015  . Chronic lumbar radicular pain 08/26/2015  . Opiate analgesic contract exists 08/26/2015  . Chronic pain syndrome 08/26/2015  . Chronic anticoagulation (Eliquis) 08/26/2015  . Clostridium difficile diarrhea 07/21/2015  . Diarrhea 07/19/2015  . Multiple myeloma ([place]) 05/04/2015  . BPH with obstruction/lower urinary tract symptoms 04/29/2015  . MI (mitral incompetence) 12/03/2014  . TI (tricuspid incompetence) 12/03/2014  . Paroxysmal atrial fibrillation ([place]) 12/03/2014  . Breathlessness on exertion 12/03/2014  . Essential (primary) hypertension 01/29/2014  . Acid reflux 01/29/2014  . Combined fat and carbohydrate induced hyperlipemia 01/29/2014  . Pulmonary embolism ([place]) 01/29/2014   Pura Spice, PT, DPT # [id number] Janna Arch, SPT 12/19/2016, 11:10 AM  [facility] [address] [place], [place], [address] Phone: [phone]   Fax:  [phone]  Name: Logan Perez MRN: [id number] Date of Birth: March 28, 1946

## 2016-12-21 ENCOUNTER — Encounter: Payer: Self-pay | Admitting: Physical Therapy

## 2016-12-21 ENCOUNTER — Ambulatory Visit: Payer: Medicare Other | Admitting: Physical Therapy

## 2016-12-21 DIAGNOSIS — R2689 Other abnormalities of gait and mobility: Secondary | ICD-10-CM

## 2016-12-21 DIAGNOSIS — M6281 Muscle weakness (generalized): Secondary | ICD-10-CM

## 2016-12-21 DIAGNOSIS — R293 Abnormal posture: Secondary | ICD-10-CM

## 2016-12-21 DIAGNOSIS — R262 Difficulty in walking, not elsewhere classified: Secondary | ICD-10-CM

## 2016-12-21 NOTE — Therapy (Signed)
[facility] [address] [place], [place], [address] Phone: [phone]   Fax:  [phone]  Physical Therapy Treatment  Patient Details  Name: Logan Perez MRN: [id number] Date of Birth: [birth year] Referring Provider: Nolon Stalls, MD  Encounter Date: 12/21/2016      PT End of Session - 12/21/16 0902    Visit Number 17   Number of Visits 23   Date for PT Re-Evaluation 01/11/17   Authorization - Visit Number 37   Authorization - Number of Visits 24   PT Start Time 5885   PT Stop Time 0946   PT Time Calculation (min) 51 min   Equipment Utilized During Treatment Gait belt   Activity Tolerance Patient tolerated treatment well   Behavior During Therapy [facility] for tasks assessed/performed      Past Medical History:  Diagnosis Date  . Anxiety   . Atrial fibrillation ([place])   . BPH (benign prostatic hyperplasia)   . Complication of anesthesia    BAD HEADACHE NIGHT OF FIRST CATARACT  . Difficulty voiding   . Dysrhythmia    A FIB  . Elevated PSA   . GERD (gastroesophageal reflux disease)   . HLD (hyperlipidemia)   . HOH (hard of hearing)   . Hypertension   . Multiple myeloma ([place])   . Neuropathy ([place])   . Pain    BACK  . Palpitations   . Pneumonia   . Pulmonary embolism ([place])   . Stroke [facility])    TIA    Past Surgical History:  Procedure Laterality Date  . BACK SURGERY  1994  . CATARACT EXTRACTION W/PHACO Right 05/02/2016   Procedure: CATARACT EXTRACTION PHACO AND INTRAOCULAR LENS PLACEMENT (IOC);  Surgeon: Birder Robson, MD;  Location: [facility] ORS;  Service: Ophthalmology;  Laterality: Right;  [place] 1.06 AP% 20.6 CDE 13.70 FLUID PACK LOT # [id number] H  . CATARACT EXTRACTION W/PHACO Left 05/16/2016   Procedure: CATARACT EXTRACTION PHACO AND INTRAOCULAR LENS PLACEMENT (IOC);  Surgeon: Birder Robson, MD;  Location: [facility];  Service: Ophthalmology;  Laterality: Left;  [place] 01:43 AP% 19.8 CDE 20.45 FLUID PACK LOT  #[id number] H  . KNEE ARTHROSCOPY Left 1992  . LIMBAL STEM CELL TRANSPLANT    . PAIN PUMP IMPLANTATION  2012  . PORTA CATH INSERTION N/A 12/04/2016   Procedure: Glori Luis Cath Insertion;  Surgeon: Algernon Huxley, MD;  Location: [place] [facility];  Service: Cardiovascular;  Laterality: N/A;  . stem cell implant  2008   [facility]    There were no vitals filed for this visit.      Subjective Assessment - 12/21/16 0859    Subjective Patient has been compliant with HEP and was working on garage door yesterday.    Patient is accompained by: Family member   Pertinent History Pt. has multiple myeloma (dx 2008.) Pt. was unable to walk for a while afterward but did PT for ~6 mo. after. Pt states cancer went into remission, no meds 2008-2011, cancer returned 2011/2012. Hx of 4 compression fx. Took chemo drug for 5 years which was beneficial, not currently taking any chemo. Sept 2017 pt had 2nd stem cell transplant, recently pt has returned to going out in public. Before stem cell transplant, pt reports he could walk a mile, but now only walks to mailbox and back since he has lost his endurance. Pt progressed well with home health PT going up and down stairs. Reports 3/10 low back pain and takes an Aleve in the  a.m.  Pt has a pain pump for spine for daily use.    Currently in Pain? No/denies   Pain Score 0-No pain     Treadmill 2.2-2.8 mph 10 minutes  Neuro Obstacle course: step over two 3" steps in a row, squat pick up ball from cone and transfer to cone laterally two times in a row, weave between 4 cones, walk across balance beam step with 6" step up.  step ups onto bosu ball 10x with single UE support Step up and over bosu ball 10x with single UE support Standing balance on bosu ball 30 sec with occasional UE support  Cone taps with 3 colored cones with PT directing pattern of touches. Tandem walking with single UE assistance 6x marching on Airex pad 1x60 seconds Long step single limb balance stepping in  parallel bars 6x length of bars.   TherEx Side squats across // bars 4x heel to toe raises 10x Single heel raises 10x BUE support TG squats 15x , single limb squats 12x each leg, heel raises 12x  Pt response for medical necessity: Pt. Will continue to benefit from skilled PT in order to keep progressing LE strengthening,balance training, and capacity for functional activities of daily life.       PT Long Term Goals - 12/14/16 1000      PT LONG TERM GOAL #1   Title Pt. will score a 56/56 on the Berg Balance Assessment in order to improve functional mobility and balance.    Baseline Ongoing,  Pt scored a 54/56 10/19/15.  Pt scored a 55/56 on 2/13/`17., 3/8: 55/56   Time 4   Period Weeks   Status On-going     PT LONG TERM GOAL #2   Title Pt will demonstrate a normalized gait pattern with a wider base of support in order to improve overall mobility.    Baseline Patient is ambulating with a functional base of support, improving his confidence in ambulating.    Time 4   Period Weeks   Status Achieved     PT LONG TERM GOAL #3   Title Pt will ambulate for 10 minutes safely without fatigue in order to increase endurance while in the community.    Baseline Patient ambulates 10 minutes safely without fatigue.    Time 4   Period Weeks   Status Achieved     PT LONG TERM GOAL #4   Title Pt. will participate in a gym-based exercise program with no limitations to promote independence.    Baseline Patient unable to return to gym at this time due to fear of exposure while on medical tx due to low immune system.    Time 4   Period Weeks   Status On-going     PT LONG TERM GOAL #5   Title Pt will ambulate for 20 minutes safely without fatigue in order to increase endurance while in the community and walking dog.    Baseline 10 minutes on treadmill level 2.2-3.2    Time 4   Period Weeks   Status On-going     Additional Long Term Goals   Additional Long Term Goals Yes     PT LONG TERM  GOAL #6   Title Patient will squat to pick up object from floor and return to standing 10x without LOB to increase safety with activities of daily living.   Baseline squat to pick up cone off ground 10x no LOB   Time 4   Period Weeks  Status Achieved     PT LONG TERM GOAL #7   Title Patient will complete 10 step ups onto 6" step without UE assist or LOB to increase mobility in the community.    Baseline Step up 10x on 6" step without UE assistance on left (difficulty on right)   Time 4   Period Weeks   Status Achieved     PT LONG TERM GOAL #8   Title Patient will perform single limb stance for 15 seconds on RLE.    Baseline 3/8: 6 seconds    Time 4   Period Weeks   Status New     PT LONG TERM GOAL  #9   TITLE Patient will perform tandem stance for 15 seconds without LOB with one foot in front of the other touching.    Baseline Patient unable to have one foot in front of the other touching, only with some space between.    Time 4   Period Weeks   Status New            Plan - 12/21/16 1035    Clinical Impression Statement Patient continues to progress with higher level balance tasks. Single limb and tandem stance activities are challenging to patient with LOB and frequent UE assistance required. Obstacle negotiation while ambulating was performed without LOB.  Pt. fatigues quickly due to medical condition and requires frequent seated rest breaks to drink water. Pt. will benefit from continued skilled physical therapy to improve LE strength, balance, safety while ambulating and capacity for functional activities ot improve mobility and quality of life.   Rehab Potential Good   Clinical Impairments Affecting Rehab Potential motivation, active lifestyle, family support.    PT Frequency 2x / week   PT Duration 4 weeks   PT Treatment/Interventions Gait training;Functional mobility training;Therapeutic exercise;Balance training;Neuromuscular re-education   PT Next Visit Plan new  HEP? single limb balance, tandem balance, bosu   PT Home Exercise Plan See handouts   Consulted and Agree with Plan of Care Patient      Patient will benefit from skilled therapeutic intervention in order to improve the following deficits and impairments:  Abnormal gait, Postural dysfunction, Decreased activity tolerance, Decreased endurance, Decreased strength, Decreased balance, Difficulty walking  Visit Diagnosis: Muscle weakness (generalized)  Difficulty in walking, not elsewhere classified  Imbalance  Abnormal posture     Problem List Patient Active Problem List   Diagnosis Date Noted  . Multiple myeloma in remission ([place]) 09/19/2016  . Xerostomia 08/23/2016  . Moderate protein-calorie malnutrition ([place]) 08/20/2016  . Thrombocytopenia ([place]) 08/20/2016  . Anemia 08/20/2016  . Nausea without vomiting 08/20/2016  . Atrial fibrillation ([place]) 07/10/2016  . 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 H/O autologous stem cell transplant ([place]) 07/10/2016  . Bone cancer ([place]) 06/15/2016  . Porokeratosis 04/06/2016  . Encounter for adjustment or management of infusion pump 03/23/2016  . Hypomagnesemia 12/01/2015  . Ejection fraction < 50% 11/30/2015  . Chronic pain 08/26/2015  . Presence of intrathecal pump 08/26/2015  . Long term current use of opiate analgesic 08/26/2015  . Long term prescription opiate use 08/26/2015  . Opiate use 08/26/2015  . Encounter for therapeutic drug level monitoring 08/26/2015  . Night muscle spasms 08/26/2015  . Muscle spasticity 08/26/2015  . Neuropathic pain 08/26/2015  . Neurogenic pain 08/26/2015  . Chronic low back pain 08/26/2015  . Lumbar spondylosis 08/26/2015  . Compression fracture of T12 vertebra (HCC) (70-75% magnitude) (with mild retropulsion) 08/26/2015  .  Diffuse myofascial pain syndrome 08/26/2015  . Presence of implanted infusion pump (Medtronic, programmable, intrathecal pump) 08/26/2015  .  Cancer associated pain 08/26/2015  . Encounter for interrogation of infusion pump 08/26/2015  . Chronic lumbar radicular pain 08/26/2015  . Opiate analgesic contract exists 08/26/2015  . Chronic pain syndrome 08/26/2015  . Chronic anticoagulation (Eliquis) 08/26/2015  . Clostridium difficile diarrhea 07/21/2015  . Diarrhea 07/19/2015  . Multiple myeloma ([place]) 05/04/2015  . BPH with obstruction/lower urinary tract symptoms 04/29/2015  . MI (mitral incompetence) 12/03/2014  . TI (tricuspid incompetence) 12/03/2014  . Paroxysmal atrial fibrillation ([place]) 12/03/2014  . Breathlessness on exertion 12/03/2014  . Essential (primary) hypertension 01/29/2014  . Acid reflux 01/29/2014  . Combined fat and carbohydrate induced hyperlipemia 01/29/2014  . Pulmonary embolism ([place]) 01/29/2014   Pura Spice, PT, DPT # [id number] Janna Arch, SPT 12/21/2016, 4:46 PM  [facility] [address] [place], [place], [address] Phone: [phone]   Fax:  [phone]  Name: Logan Perez MRN: [id number] Date of Birth: 10/01/1946

## 2016-12-25 ENCOUNTER — Ambulatory Visit: Payer: Medicare Other | Admitting: Physical Therapy

## 2016-12-25 DIAGNOSIS — R293 Abnormal posture: Secondary | ICD-10-CM

## 2016-12-25 DIAGNOSIS — R2689 Other abnormalities of gait and mobility: Secondary | ICD-10-CM

## 2016-12-25 DIAGNOSIS — M6281 Muscle weakness (generalized): Secondary | ICD-10-CM | POA: Diagnosis not present

## 2016-12-25 DIAGNOSIS — R262 Difficulty in walking, not elsewhere classified: Secondary | ICD-10-CM

## 2016-12-25 NOTE — Therapy (Signed)
[facility] [address] [place], [place], [address] Phone: [phone]   Fax:  [phone]  Physical Therapy Treatment  Patient Details  Name: Logan Perez MRN: [id number] Date of Birth: 15-Aug-1946 Referring Provider: Nolon Stalls, MD  Encounter Date: 12/25/2016      PT End of Session - 12/25/16 1512    Visit Number 18   Number of Visits 23   Date for PT Re-Evaluation 01/11/17   Authorization - Visit Number 18   Authorization - Number of Visits 24   PT Start Time [phone]   PT Stop Time 1028   PT Time Calculation (min) 50 min   Equipment Utilized During Treatment Gait belt   Activity Tolerance Patient tolerated treatment well   Behavior During Therapy [facility] for tasks assessed/performed      Past Medical History:  Diagnosis Date  . Anxiety   . Atrial fibrillation ([place])   . BPH (benign prostatic hyperplasia)   . Complication of anesthesia    BAD HEADACHE NIGHT OF FIRST CATARACT  . Difficulty voiding   . Dysrhythmia    A FIB  . Elevated PSA   . GERD (gastroesophageal reflux disease)   . HLD (hyperlipidemia)   . HOH (hard of hearing)   . Hypertension   . Multiple myeloma ([place])   . Neuropathy ([place])   . Pain    BACK  . Palpitations   . Pneumonia   . Pulmonary embolism ([place])   . Stroke [facility])    TIA    Past Surgical History:  Procedure Laterality Date  . BACK SURGERY  1994  . CATARACT EXTRACTION W/PHACO Right 05/02/2016   Procedure: CATARACT EXTRACTION PHACO AND INTRAOCULAR LENS PLACEMENT (IOC);  Surgeon: Birder Robson, MD;  Location: [facility] ORS;  Service: Ophthalmology;  Laterality: Right;  [place] 1.06 AP% 20.6 CDE 13.70 FLUID PACK LOT # [id number] H  . CATARACT EXTRACTION W/PHACO Left 05/16/2016   Procedure: CATARACT EXTRACTION PHACO AND INTRAOCULAR LENS PLACEMENT (IOC);  Surgeon: Birder Robson, MD;  Location: [facility] ORS;  Service: Ophthalmology;  Laterality: Left;  [place] 01:43 AP% 19.8 CDE 20.45 FLUID PACK LOT  #[id number] H  . KNEE ARTHROSCOPY Left 1992  . LIMBAL STEM CELL TRANSPLANT    . PAIN PUMP IMPLANTATION  2012  . PORTA CATH INSERTION N/A 12/04/2016   Procedure: Glori Luis Cath Insertion;  Surgeon: Algernon Huxley, MD;  Location: [place] [facility];  Service: Cardiovascular;  Laterality: N/A;  . stem cell implant  2008   [facility]    There were no vitals filed for this visit.      Subjective Assessment - 12/25/16 0949    Subjective Patient has been having some some GI issues, balance has been a little off this weekend, toes are having more neuropathy than normal. Hasn't been compliant with HEP   Patient is accompained by: Family member   Pertinent History Pt. has multiple myeloma (dx 2008.) Pt. was unable to walk for a while afterward but did PT for ~6 mo. after. Pt states cancer went into remission, no meds 2008-2011, cancer returned 2011/2012. Hx of 4 compression fx. Took chemo drug for 5 years which was beneficial, not currently taking any chemo. Sept 2017 pt had 2nd stem cell transplant, recently pt has returned to going out in public. Before stem cell transplant, pt reports he could walk a mile, but now only walks to mailbox and back since he has lost his endurance. Pt progressed well with home health PT going up  and down stairs. Reports 3/10 low back pain and takes an Aleve in the a.m.  Pt has a pain pump for spine for daily use.    Pain Score 2    Pain Location Back   Pain Orientation Lower     Neuro: Bosu side step ups Airex pad: tandem stance (4 inch between feet) both sides Airex pad crane stance both sides 10x Cone taps 3 cones Step over 3" steps 4x Side step over 3" steps. Cues for upright posture Star: 3 directions at a time. Frequent LOB due to fatigue.   TherEx Nustep 10 minutes (no charge) Weighted ball squat to pick up and throw to trampoline Stairs 4x, occasional LOB descending Eccentric heel taps 10x Standing exercises hip flexion, extension, hamstring curls, marches,  abduction Squat with chair behind x12 Side squat walks in // bars   Pt. Response to medical necessity: Patient will benefit from balance training, generalized strengthening of LE, and increased capacity of functional activities to improve quality of life and safety of community mobility.       PT Long Term Goals - 12/14/16 1000      PT LONG TERM GOAL #1   Title Pt. will score a 56/56 on the Berg Balance Assessment in order to improve functional mobility and balance.    Baseline Ongoing,  Pt scored a 54/56 10/19/15.  Pt scored a 55/56 on 2/13/`17., 3/8: 55/56   Time 4   Period Weeks   Status On-going     PT LONG TERM GOAL #2   Title Pt will demonstrate a normalized gait pattern with a wider base of support in order to improve overall mobility.    Baseline Patient is ambulating with a functional base of support, improving his confidence in ambulating.    Time 4   Period Weeks   Status Achieved     PT LONG TERM GOAL #3   Title Pt will ambulate for 10 minutes safely without fatigue in order to increase endurance while in the community.    Baseline Patient ambulates 10 minutes safely without fatigue.    Time 4   Period Weeks   Status Achieved     PT LONG TERM GOAL #4   Title Pt. will participate in a gym-based exercise program with no limitations to promote independence.    Baseline Patient unable to return to gym at this time due to fear of exposure while on medical tx due to low immune system.    Time 4   Period Weeks   Status On-going     PT LONG TERM GOAL #5   Title Pt will ambulate for 20 minutes safely without fatigue in order to increase endurance while in the community and walking dog.    Baseline 10 minutes on treadmill level 2.2-3.2    Time 4   Period Weeks   Status On-going     Additional Long Term Goals   Additional Long Term Goals Yes     PT LONG TERM GOAL #6   Title Patient will squat to pick up object from floor and return to standing 10x without LOB to  increase safety with activities of daily living.   Baseline squat to pick up cone off ground 10x no LOB   Time 4   Period Weeks   Status Achieved     PT LONG TERM GOAL #7   Title Patient will complete 10 step ups onto 6" step without UE assist or LOB to increase mobility in  the community.    Baseline Step up 10x on 6" step without UE assistance on left (difficulty on right)   Time 4   Period Weeks   Status Achieved     PT LONG TERM GOAL #8   Title Patient will perform single limb stance for 15 seconds on RLE.    Baseline 3/8: 6 seconds    Time 4   Period Weeks   Status New     PT LONG TERM GOAL  #9   TITLE Patient will perform tandem stance for 15 seconds without LOB with one foot in front of the other touching.    Baseline Patient unable to have one foot in front of the other touching, only with some space between.    Time 4   Period Weeks   Status New            Plan - 12/25/16 1805    Clinical Impression Statement Patient presented to therapy session with general fatigue and decreased balance. Pt. has had some gastrointestinal distress over the weekend. Frequent LOB were noted and pt. required additional rest breaks and cues to breath. Single limb balance continues to challenge patient with improvements noted in standing tandem. Body mechanic cues for lifting object from floor were required as well as for upright posture during exercises. Pt. will continue to benefit from skilled physical therapy to improve LE strength and balance to improve safety, generalized mobility and quality of life    Rehab Potential Good   Clinical Impairments Affecting Rehab Potential motivation, active lifestyle, family support.    PT Frequency 2x / week   PT Duration 4 weeks   PT Treatment/Interventions Gait training;Functional mobility training;Therapeutic exercise;Balance training;Neuromuscular re-education   PT Next Visit Plan tandem balance, new HEP?   PT Home Exercise Plan See handouts    Consulted and Agree with Plan of Care Patient      Patient will benefit from skilled therapeutic intervention in order to improve the following deficits and impairments:  Abnormal gait, Postural dysfunction, Decreased activity tolerance, Decreased endurance, Decreased strength, Decreased balance, Difficulty walking  Visit Diagnosis: Muscle weakness (generalized)  Difficulty in walking, not elsewhere classified  Imbalance  Abnormal posture     Problem List Patient Active Problem List   Diagnosis Date Noted  . Multiple myeloma in remission ([place]) 09/19/2016  . Xerostomia 08/23/2016  . Moderate protein-calorie malnutrition ([place]) 08/20/2016  . Thrombocytopenia ([place]) 08/20/2016  . Anemia 08/20/2016  . Nausea without vomiting 08/20/2016  . Atrial fibrillation ([place]) 07/10/2016  . 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 H/O autologous stem cell transplant ([place]) 07/10/2016  . Bone cancer ([place]) 06/15/2016  . Porokeratosis 04/06/2016  . Encounter for adjustment or management of infusion pump 03/23/2016  . Hypomagnesemia 12/01/2015  . Ejection fraction < 50% 11/30/2015  . Chronic pain 08/26/2015  . Presence of intrathecal pump 08/26/2015  . Long term current use of opiate analgesic 08/26/2015  . Long term prescription opiate use 08/26/2015  . Opiate use 08/26/2015  . Encounter for therapeutic drug level monitoring 08/26/2015  . Night muscle spasms 08/26/2015  . Muscle spasticity 08/26/2015  . Neuropathic pain 08/26/2015  . Neurogenic pain 08/26/2015  . Chronic low back pain 08/26/2015  . Lumbar spondylosis 08/26/2015  . Compression fracture of T12 vertebra (HCC) (70-75% magnitude) (with mild retropulsion) 08/26/2015  . Diffuse myofascial pain syndrome 08/26/2015  . Presence of implanted infusion pump (Medtronic, programmable, intrathecal pump) 08/26/2015  . Cancer associated pain 08/26/2015  .  Encounter for interrogation of infusion pump  08/26/2015  . Chronic lumbar radicular pain 08/26/2015  . Opiate analgesic contract exists 08/26/2015  . Chronic pain syndrome 08/26/2015  . Chronic anticoagulation (Eliquis) 08/26/2015  . Clostridium difficile diarrhea 07/21/2015  . Diarrhea 07/19/2015  . Multiple myeloma ([place]) 05/04/2015  . BPH with obstruction/lower urinary tract symptoms 04/29/2015  . MI (mitral incompetence) 12/03/2014  . TI (tricuspid incompetence) 12/03/2014  . Paroxysmal atrial fibrillation ([place]) 12/03/2014  . Breathlessness on exertion 12/03/2014  . Essential (primary) hypertension 01/29/2014  . Acid reflux 01/29/2014  . Combined fat and carbohydrate induced hyperlipemia 01/29/2014  . Pulmonary embolism ([place]) 01/29/2014   Pura Spice, PT, DPT # [id number] Janna Arch, SPT 12/26/2016, 7:21 AM  [facility] [address] [place], [place], [address] Phone: [phone]   Fax:  [phone]  Name: Logan Perez MRN: [id number] Date of Birth: Apr 10, 1946

## 2016-12-28 ENCOUNTER — Ambulatory Visit: Payer: Medicare Other | Admitting: Physical Therapy

## 2016-12-28 DIAGNOSIS — M6281 Muscle weakness (generalized): Secondary | ICD-10-CM | POA: Diagnosis not present

## 2016-12-28 DIAGNOSIS — R293 Abnormal posture: Secondary | ICD-10-CM

## 2016-12-28 DIAGNOSIS — R2689 Other abnormalities of gait and mobility: Secondary | ICD-10-CM

## 2016-12-28 DIAGNOSIS — R262 Difficulty in walking, not elsewhere classified: Secondary | ICD-10-CM

## 2016-12-28 NOTE — Therapy (Signed)
[facility] [address] [place], [place], [address] Phone: [phone]   Fax:  [phone]  Physical Therapy Treatment  Patient Details  Name: Logan Perez MRN: [id number] Date of Birth: 1945/12/14 Referring Provider: Nolon Stalls, MD  Encounter Date: 12/28/2016      PT End of Session - 12/28/16 1110    Visit Number 19   Number of Visits 23   Date for PT Re-Evaluation 01/11/17   Authorization - Visit Number 21   Authorization - Number of Visits 24   PT Start Time 0948   PT Stop Time 1039   PT Time Calculation (min) 51 min   Equipment Utilized During Treatment Gait belt   Activity Tolerance Patient tolerated treatment well   Behavior During Therapy [facility] for tasks assessed/performed      Past Medical History:  Diagnosis Date  . Anxiety   . Atrial fibrillation ([place])   . BPH (benign prostatic hyperplasia)   . Complication of anesthesia    BAD HEADACHE NIGHT OF FIRST CATARACT  . Difficulty voiding   . Dysrhythmia    A FIB  . Elevated PSA   . GERD (gastroesophageal reflux disease)   . HLD (hyperlipidemia)   . HOH (hard of hearing)   . Hypertension   . Multiple myeloma ([place])   . Neuropathy ([place])   . Pain    BACK  . Palpitations   . Pneumonia   . Pulmonary embolism ([place])   . Stroke [facility])    TIA    Past Surgical History:  Procedure Laterality Date  . BACK SURGERY  1994  . CATARACT EXTRACTION W/PHACO Right 05/02/2016   Procedure: CATARACT EXTRACTION PHACO AND INTRAOCULAR LENS PLACEMENT (IOC);  Surgeon: Birder Robson, MD;  Location: [facility] ORS;  Service: Ophthalmology;  Laterality: Right;  [place] 1.06 AP% 20.6 CDE 13.70 FLUID PACK LOT # [id number] H  . CATARACT EXTRACTION W/PHACO Left 05/16/2016   Procedure: CATARACT EXTRACTION PHACO AND INTRAOCULAR LENS PLACEMENT (IOC);  Surgeon: Birder Robson, MD;  Location: [facility] ORS;  Service: Ophthalmology;  Laterality: Left;  [place] 01:43 AP% 19.8 CDE 20.45 FLUID PACK LOT  #[id number] H  . KNEE ARTHROSCOPY Left 1992  . LIMBAL STEM CELL TRANSPLANT    . PAIN PUMP IMPLANTATION  2012  . PORTA CATH INSERTION N/A 12/04/2016   Procedure: Glori Luis Cath Insertion;  Surgeon: Algernon Huxley, MD;  Location: [place] [facility];  Service: Cardiovascular;  Laterality: N/A;  . stem cell implant  2008   [facility]    There were no vitals filed for this visit.      Subjective Assessment - 12/28/16 1001    Subjective Patient has had no LOB since last session and has been able to clean his house with no difficulty. Pt had some right leg discomfort on IT band region with slight palpable lump.    Patient is accompained by: Family member   Pertinent History Pt. has multiple myeloma (dx 2008.) Pt. was unable to walk for a while afterward but did PT for ~6 mo. after. Pt states cancer went into remission, no meds 2008-2011, cancer returned 2011/2012. Hx of 4 compression fx. Took chemo drug for 5 years which was beneficial, not currently taking any chemo. Sept 2017 pt had 2nd stem cell transplant, recently pt has returned to going out in public. Before stem cell transplant, pt reports he could walk a mile, but now only walks to mailbox and back since he has lost his endurance. Pt progressed well  with home health PT going up and down stairs. Reports 3/10 low back pain and takes an Aleve in the a.m.  Pt has a pain pump for spine for daily use.    Currently in Pain? No/denies    TherEx #5 ankle weights each leg hip flex/abd/hamstring curls/ marching, 2x10 each Sit on therex ball hand raise, heel raise,  Side stepping in parallel bars  Neuro:  Obstacle course:  4x occasional LOB, more difficult to lead with left. Step up/down progression of 6" 10" 6", balance beam 8 ft long 6" wide, Large side steps skiers to numbers on floor, standing on Airex pad throwing balls at velcro target.  grapevine 6x single UE assist Ball under foot circles with single UE assist 30 sec each leg x2  Nustep lvl 7 10   minutes   Pt. will continue to benefit from skilled physical therapy to improve LE strength and balance to improve safety, generalized mobility, and quality of life.        PT Long Term Goals - 12/14/16 1000      PT LONG TERM GOAL #1   Title Pt. will score a 56/56 on the Berg Balance Assessment in order to improve functional mobility and balance.    Baseline Ongoing,  Pt scored a 54/56 10/19/15.  Pt scored a 55/56 on 2/13/`17., 3/8: 55/56   Time 4   Period Weeks   Status On-going     PT LONG TERM GOAL #2   Title Pt will demonstrate a normalized gait pattern with a wider base of support in order to improve overall mobility.    Baseline Patient is ambulating with a functional base of support, improving his confidence in ambulating.    Time 4   Period Weeks   Status Achieved     PT LONG TERM GOAL #3   Title Pt will ambulate for 10 minutes safely without fatigue in order to increase endurance while in the community.    Baseline Patient ambulates 10 minutes safely without fatigue.    Time 4   Period Weeks   Status Achieved     PT LONG TERM GOAL #4   Title Pt. will participate in a gym-based exercise program with no limitations to promote independence.    Baseline Patient unable to return to gym at this time due to fear of exposure while on medical tx due to low immune system.    Time 4   Period Weeks   Status On-going     PT LONG TERM GOAL #5   Title Pt will ambulate for 20 minutes safely without fatigue in order to increase endurance while in the community and walking dog.    Baseline 10 minutes on treadmill level 2.2-3.2    Time 4   Period Weeks   Status On-going     Additional Long Term Goals   Additional Long Term Goals Yes     PT LONG TERM GOAL #6   Title Patient will squat to pick up object from floor and return to standing 10x without LOB to increase safety with activities of daily living.   Baseline squat to pick up cone off ground 10x no LOB   Time 4   Period  Weeks   Status Achieved     PT LONG TERM GOAL #7   Title Patient will complete 10 step ups onto 6" step without UE assist or LOB to increase mobility in the community.    Baseline Step up 10x on 6"  step without UE assistance on left (difficulty on right)   Time 4   Period Weeks   Status Achieved     PT LONG TERM GOAL #8   Title Patient will perform single limb stance for 15 seconds on RLE.    Baseline 3/8: 6 seconds    Time 4   Period Weeks   Status New     PT LONG TERM GOAL  #9   TITLE Patient will perform tandem stance for 15 seconds without LOB with one foot in front of the other touching.    Baseline Patient unable to have one foot in front of the other touching, only with some space between.    Time 4   Period Weeks   Status New               Plan - 12/28/16 1116    Clinical Impression Statement Pt. presented to physical therapy with better capacity for general strengthening activities. Increased weights were implemented in standing LE strengthening activity with good performance. Pt. had a small palpable lump on RLE IT band region that causes intermittent discomfort and was advised to inform oncologist. Balance course was performed with improved balance with repetition. Large side steps were difficult initially due to the single limb demands of the task. Leading with left leg was more difficult than with right due to LLE weakness>RLE. Pt. will continue to benefit from skilled physical therapy to improve LE strength and balance to improve safety, generalized mobility, and quality of life.    Rehab Potential Good   Clinical Impairments Affecting Rehab Potential motivation, active lifestyle, family support.    PT Frequency 2x / week   PT Duration 4 weeks   PT Treatment/Interventions Gait training;Functional mobility training;Therapeutic exercise;Balance training;Neuromuscular re-education   PT Next Visit Plan tandem balance, new HEP?   PT Home Exercise Plan See handouts    Consulted and Agree with Plan of Care Patient      Patient will benefit from skilled therapeutic intervention in order to improve the following deficits and impairments:  Abnormal gait, Postural dysfunction, Decreased activity tolerance, Decreased endurance, Decreased strength, Decreased balance, Difficulty walking  Visit Diagnosis: Muscle weakness (generalized)  Difficulty in walking, not elsewhere classified  Imbalance  Abnormal posture     Problem List Patient Active Problem List   Diagnosis Date Noted  . Multiple myeloma in remission ([place]) 09/19/2016  . Xerostomia 08/23/2016  . Moderate protein-calorie malnutrition ([place]) 08/20/2016  . Thrombocytopenia ([place]) 08/20/2016  . Anemia 08/20/2016  . Nausea without vomiting 08/20/2016  . Atrial fibrillation ([place]) 07/10/2016  . 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 H/O autologous stem cell transplant ([place]) 07/10/2016  . Bone cancer ([place]) 06/15/2016  . Porokeratosis 04/06/2016  . Encounter for adjustment or management of infusion pump 03/23/2016  . Hypomagnesemia 12/01/2015  . Ejection fraction < 50% 11/30/2015  . Chronic pain 08/26/2015  . Presence of intrathecal pump 08/26/2015  . Long term current use of opiate analgesic 08/26/2015  . Long term prescription opiate use 08/26/2015  . Opiate use 08/26/2015  . Encounter for therapeutic drug level monitoring 08/26/2015  . Night muscle spasms 08/26/2015  . Muscle spasticity 08/26/2015  . Neuropathic pain 08/26/2015  . Neurogenic pain 08/26/2015  . Chronic low back pain 08/26/2015  . Lumbar spondylosis 08/26/2015  . Compression fracture of T12 vertebra (HCC) (70-75% magnitude) (with mild retropulsion) 08/26/2015  . Diffuse myofascial pain syndrome 08/26/2015  . Presence of implanted infusion pump (Medtronic,  programmable, intrathecal pump) 08/26/2015  . Cancer associated pain 08/26/2015  . Encounter for interrogation of infusion pump  08/26/2015  . Chronic lumbar radicular pain 08/26/2015  . Opiate analgesic contract exists 08/26/2015  . Chronic pain syndrome 08/26/2015  . Chronic anticoagulation (Eliquis) 08/26/2015  . Clostridium difficile diarrhea 07/21/2015  . Diarrhea 07/19/2015  . Multiple myeloma ([place]) 05/04/2015  . BPH with obstruction/lower urinary tract symptoms 04/29/2015  . MI (mitral incompetence) 12/03/2014  . TI (tricuspid incompetence) 12/03/2014  . Paroxysmal atrial fibrillation ([place]) 12/03/2014  . Breathlessness on exertion 12/03/2014  . Essential (primary) hypertension 01/29/2014  . Acid reflux 01/29/2014  . Combined fat and carbohydrate induced hyperlipemia 01/29/2014  . Pulmonary embolism ([place]) 01/29/2014   Pura Spice, PT, DPT # [id number] Janna Arch, SPT 12/28/2016, 5:14 PM  [facility] [address] [place], [place], [address] Phone: [phone]   Fax:  [phone]  Name: Logan Perez MRN: [id number] Date of Birth: 1946/04/28

## 2017-01-01 ENCOUNTER — Encounter: Payer: Self-pay | Admitting: Physical Therapy

## 2017-01-01 ENCOUNTER — Ambulatory Visit: Payer: Medicare Other | Admitting: Physical Therapy

## 2017-01-01 DIAGNOSIS — M6281 Muscle weakness (generalized): Secondary | ICD-10-CM

## 2017-01-01 DIAGNOSIS — R293 Abnormal posture: Secondary | ICD-10-CM

## 2017-01-01 DIAGNOSIS — R262 Difficulty in walking, not elsewhere classified: Secondary | ICD-10-CM

## 2017-01-01 DIAGNOSIS — R2689 Other abnormalities of gait and mobility: Secondary | ICD-10-CM

## 2017-01-01 NOTE — Therapy (Signed)
[facility] [address] [place], [place], [address] Phone: [phone]   Fax:  [phone]  Physical Therapy Treatment  Patient Details  Name: Logan Perez MRN: [id number] Date of Birth: Jul 29, 1946 Referring Provider: Nolon Stalls, MD  Encounter Date: 01/01/2017      PT End of Session - 01/01/17 1331    Visit Number 20   Number of Visits 23   Date for PT Re-Evaluation 01/11/17   Authorization - Visit Number 18   Authorization - Number of Visits 24   PT Start Time 0941   PT Stop Time 1033   PT Time Calculation (min) 52 min   Equipment Utilized During Treatment Gait belt   Activity Tolerance Patient tolerated treatment well   Behavior During Therapy [facility] for tasks assessed/performed      Past Medical History:  Diagnosis Date  . Anxiety   . Atrial fibrillation ([place])   . BPH (benign prostatic hyperplasia)   . Complication of anesthesia    BAD HEADACHE NIGHT OF FIRST CATARACT  . Difficulty voiding   . Dysrhythmia    A FIB  . Elevated PSA   . GERD (gastroesophageal reflux disease)   . HLD (hyperlipidemia)   . HOH (hard of hearing)   . Hypertension   . Multiple myeloma ([place])   . Neuropathy ([place])   . Pain    BACK  . Palpitations   . Pneumonia   . Pulmonary embolism ([place])   . Stroke [facility])    TIA    Past Surgical History:  Procedure Laterality Date  . BACK SURGERY  1994  . CATARACT EXTRACTION W/PHACO Right 05/02/2016   Procedure: CATARACT EXTRACTION PHACO AND INTRAOCULAR LENS PLACEMENT (IOC);  Surgeon: Birder Robson, MD;  Location: [facility] ORS;  Service: Ophthalmology;  Laterality: Right;  [place] 1.06 AP% 20.6 CDE 13.70 FLUID PACK LOT # [id number]  . CATARACT EXTRACTION W/PHACO Left 05/16/2016   Procedure: CATARACT EXTRACTION PHACO AND INTRAOCULAR LENS PLACEMENT (IOC);  Surgeon: Birder Robson, MD;  Location: [facility] ORS;  Service: Ophthalmology;  Laterality: Left;  [place] 01:43 AP% 19.8 CDE 20.45 FLUID PACK LOT  #[id number] H  . KNEE ARTHROSCOPY Left 1992  . LIMBAL STEM CELL TRANSPLANT    . PAIN PUMP IMPLANTATION  2012  . PORTA CATH INSERTION N/A 12/04/2016   Procedure: Glori Luis Cath Insertion;  Surgeon: Algernon Huxley, MD;  Location: [place] [facility];  Service: Cardiovascular;  Laterality: N/A;  . stem cell implant  2008   [facility]    There were no vitals filed for this visit.      Subjective Assessment - 01/01/17 0950    Subjective Patient has had no LOB since last session. He walked approximately a mile yesterday walking the dog. He has some right shoulder pain from getting out of bed the other day.    Patient is accompained by: Family member   Pertinent History Pt. has multiple myeloma (dx 2008.) Pt. was unable to walk for a while afterward but did PT for ~6 mo. after. Pt states cancer went into remission, no meds 2008-2011, cancer returned 2011/2012. Hx of 4 compression fx. Took chemo drug for 5 years which was beneficial, not currently taking any chemo. Sept 2017 pt had 2nd stem cell transplant, recently pt has returned to going out in public. Before stem cell transplant, pt reports he could walk a mile, but now only walks to mailbox and back since he has lost his endurance. Pt progressed well with home  health PT going up and down stairs. Reports 3/10 low back pain and takes an Aleve in the a.m.  Pt has a pain pump for spine for daily use.    Currently in Pain? No/denies     Neuro Bosu ball 2x60 seconds standing bosu ball crane step ups x 10 opp knee and hand 40f x 2 Forward larges lunge steps 4x Tandem walking  TherEx Side step with UE abduction 64 ft x 2 Backwards walking 30 ft  4 cone exercise: side step, forward walk, side step, backward walk 171feach direction 3 times 5 lb ankle weights marching x 30 seoncs 1 min marches Resisted walking in // bars forward backwards sideways 3x Side stepping to blue lines  Squats x 10  Response to medical necessity: Patient will benefit from continued  skilled physical therapy to improve safety, generalized mobility, and quality of life.         PT Long Term Goals - 12/14/16 1000      PT LONG TERM GOAL #1   Title Pt. will score a 56/56 on the Berg Balance Assessment in order to improve functional mobility and balance.    Baseline Ongoing,  Pt scored a 54/56 10/19/15.  Pt scored a 55/56 on 2/13/`17., 3/8: 55/56   Time 4   Period Weeks   Status On-going     PT LONG TERM GOAL #2   Title Pt will demonstrate a normalized gait pattern with a wider base of support in order to improve overall mobility.    Baseline Patient is ambulating with a functional base of support, improving his confidence in ambulating.    Time 4   Period Weeks   Status Achieved     PT LONG TERM GOAL #3   Title Pt will ambulate for 10 minutes safely without fatigue in order to increase endurance while in the community.    Baseline Patient ambulates 10 minutes safely without fatigue.    Time 4   Period Weeks   Status Achieved     PT LONG TERM GOAL #4   Title Pt. will participate in a gym-based exercise program with no limitations to promote independence.    Baseline Patient unable to return to gym at this time due to fear of exposure while on medical tx due to low immune system.    Time 4   Period Weeks   Status On-going     PT LONG TERM GOAL #5   Title Pt will ambulate for 20 minutes safely without fatigue in order to increase endurance while in the community and walking dog.    Baseline 10 minutes on treadmill level 2.2-3.2    Time 4   Period Weeks   Status On-going     Additional Long Term Goals   Additional Long Term Goals Yes     PT LONG TERM GOAL #6   Title Patient will squat to pick up object from floor and return to standing 10x without LOB to increase safety with activities of daily living.   Baseline squat to pick up cone off ground 10x no LOB   Time 4   Period Weeks   Status Achieved     PT LONG TERM GOAL #7   Title Patient will complete  10 step ups onto 6" step without UE assist or LOB to increase mobility in the community.    Baseline Step up 10x on 6" step without UE assistance on left (difficulty on right)   Time 4  Period Weeks   Status Achieved     PT LONG TERM GOAL #8   Title Patient will perform single limb stance for 15 seconds on RLE.    Baseline 3/8: 6 seconds    Time 4   Period Weeks   Status New     PT LONG TERM GOAL  #9   TITLE Patient will perform tandem stance for 15 seconds without LOB with one foot in front of the other touching.    Baseline Patient unable to have one foot in front of the other touching, only with some space between.    Time 4   Period Weeks   Status New            Plan - 01/01/17 1336    Clinical Impression Statement Patient is starting to walk further between sessions with dog. Weighted marching was progressed in duration and intensity with good body mechanics. Carryover noted in ambulation post weighted marches with increased step length and heel strike. Balance was challenged with Bosu ball and ambulating with coordinated movements. Cross body/cross plane motions while ambulating was implemented and pt. improved with repetition. Ambulating in all planes with and without resistance was implemented to continue to progress patient's functional ambulatory capacity in a safe effective manner. Patient will benefit from continued skilled physical therapy to improve safety, generalized mobility, and quality of life.    Rehab Potential Good   Clinical Impairments Affecting Rehab Potential motivation, active lifestyle, family support.    PT Frequency 2x / week   PT Duration 4 weeks   PT Treatment/Interventions Gait training;Functional mobility training;Therapeutic exercise;Balance training;Neuromuscular re-education   PT Next Visit Plan cross body exercises, recert?   PT Home Exercise Plan See handouts   Consulted and Agree with Plan of Care Patient      Patient will benefit from  skilled therapeutic intervention in order to improve the following deficits and impairments:  Abnormal gait, Postural dysfunction, Decreased activity tolerance, Decreased endurance, Decreased strength, Decreased balance, Difficulty walking  Visit Diagnosis: Muscle weakness (generalized)  Difficulty in walking, not elsewhere classified  Imbalance  Abnormal posture     Problem List Patient Active Problem List   Diagnosis Date Noted  . Multiple myeloma in remission ([place]) 09/19/2016  . Xerostomia 08/23/2016  . Moderate protein-calorie malnutrition ([place]) 08/20/2016  . Thrombocytopenia ([place]) 08/20/2016  . Anemia 08/20/2016  . Nausea without vomiting 08/20/2016  . Atrial fibrillation ([place]) 07/10/2016  . 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 H/O autologous stem cell transplant ([place]) 07/10/2016  . Bone cancer ([place]) 06/15/2016  . Porokeratosis 04/06/2016  . Encounter for adjustment or management of infusion pump 03/23/2016  . Hypomagnesemia 12/01/2015  . Ejection fraction < 50% 11/30/2015  . Chronic pain 08/26/2015  . Presence of intrathecal pump 08/26/2015  . Long term current use of opiate analgesic 08/26/2015  . Long term prescription opiate use 08/26/2015  . Opiate use 08/26/2015  . Encounter for therapeutic drug level monitoring 08/26/2015  . Night muscle spasms 08/26/2015  . Muscle spasticity 08/26/2015  . Neuropathic pain 08/26/2015  . Neurogenic pain 08/26/2015  . Chronic low back pain 08/26/2015  . Lumbar spondylosis 08/26/2015  . Compression fracture of T12 vertebra (H[facility]) (70-75% magnitude) (with mild retropulsion) 08/26/2015  . Diffuse myofascial pain syndrome 08/26/2015  . Presence of implanted infusion pump (Medtronic, programmable, intrathecal pump) 08/26/2015  . Cancer associated pain 08/26/2015  . Encounter for interrogation of infusion pump 08/26/2015  . Chronic lumbar radicular pain  08/26/2015  . Opiate analgesic contract  exists 08/26/2015  . Chronic pain syndrome 08/26/2015  . Chronic anticoagulation (Eliquis) 08/26/2015  . Clostridium difficile diarrhea 07/21/2015  . Diarrhea 07/19/2015  . Multiple myeloma ([place]) 05/04/2015  . BPH with obstruction/lower urinary tract symptoms 04/29/2015  . MI (mitral incompetence) 12/03/2014  . TI (tricuspid incompetence) 12/03/2014  . Paroxysmal atrial fibrillation ([place]) 12/03/2014  . Breathlessness on exertion 12/03/2014  . Essential (primary) hypertension 01/29/2014  . Acid reflux 01/29/2014  . Combined fat and carbohydrate induced hyperlipemia 01/29/2014  . Pulmonary embolism ([place]) 01/29/2014   Pura Spice, PT, DPT # [id number] Janna Arch, SPT 01/01/2017, 2:16 PM  [place] [facility] [address] [place], [place], [address] Phone: [phone]   Fax:  [phone]  Name: Logan Perez MRN: [id number] Date of Birth: Aug 06, 1946

## 2017-01-02 ENCOUNTER — Ambulatory Visit: Payer: Medicare Other

## 2017-01-02 ENCOUNTER — Ambulatory Visit: Payer: Medicare Other | Attending: Pain Medicine | Admitting: Pain Medicine

## 2017-01-02 ENCOUNTER — Inpatient Hospital Stay: Payer: Medicare Other | Admitting: Hematology and Oncology

## 2017-01-02 ENCOUNTER — Other Ambulatory Visit: Payer: Medicare Other

## 2017-01-02 ENCOUNTER — Encounter: Payer: Self-pay | Admitting: Pain Medicine

## 2017-01-02 VITALS — BP 159/72 | HR 82 | Temp 97.5°F | Resp 18 | Ht 67.0 in | Wt 150.0 lb

## 2017-01-02 DIAGNOSIS — Z9889 Other specified postprocedural states: Secondary | ICD-10-CM | POA: Diagnosis not present

## 2017-01-02 DIAGNOSIS — C9001 Multiple myeloma in remission: Secondary | ICD-10-CM | POA: Insufficient documentation

## 2017-01-02 DIAGNOSIS — G893 Neoplasm related pain (acute) (chronic): Secondary | ICD-10-CM | POA: Insufficient documentation

## 2017-01-02 DIAGNOSIS — M4854XA Collapsed vertebra, not elsewhere classified, thoracic region, initial encounter for fracture: Secondary | ICD-10-CM | POA: Diagnosis not present

## 2017-01-02 DIAGNOSIS — M545 Low back pain: Secondary | ICD-10-CM | POA: Diagnosis present

## 2017-01-02 DIAGNOSIS — F1190 Opioid use, unspecified, uncomplicated: Secondary | ICD-10-CM

## 2017-01-02 DIAGNOSIS — S22080A Wedge compression fracture of T11-T12 vertebra, initial encounter for closed fracture: Secondary | ICD-10-CM

## 2017-01-02 DIAGNOSIS — Z79891 Long term (current) use of opiate analgesic: Secondary | ICD-10-CM

## 2017-01-02 DIAGNOSIS — Z451 Encounter for adjustment and management of infusion pump: Secondary | ICD-10-CM

## 2017-01-02 DIAGNOSIS — Z95828 Presence of other vascular implants and grafts: Secondary | ICD-10-CM

## 2017-01-02 DIAGNOSIS — G894 Chronic pain syndrome: Secondary | ICD-10-CM

## 2017-01-02 NOTE — Patient Instructions (Signed)
Pain Management Discharge Instructions  General Discharge Instructions :  If you need to reach your doctor call: Monday-Friday 8:00 am - 4:00 pm at [phone] or toll free [phone].  After clinic hours [phone] to have operator reach doctor.  Bring all of your medication bottles to all your appointments in the pain clinic.  To cancel or reschedule your appointment with Pain Management please remember to call 24 hours in advance to avoid a fee.  Refer to the educational materials which you have been given on: General Risks, I had my Procedure. Discharge Instructions, Post Sedation.  Post Procedure Instructions:  The drugs you were given will stay in your system until tomorrow, so for the next 24 hours you should not drive, make any legal decisions or drink any alcoholic beverages.  You may eat anything you prefer, but it is better to start with liquids then soups and crackers, and gradually work up to solid foods.  Please notify your doctor immediately if you have any unusual bleeding, trouble breathing or pain that is not related to your normal pain.  Depending on the type of procedure that was done, some parts of your body may feel week and/or numb.  This usually clears up by tonight or the next day.  Walk with the use of an assistive device or accompanied by an adult for the 24 hours.  You may use ice on the affected area for the first 24 hours.  Put ice in a Ziploc bag and cover with a towel and place against area 15 minutes on 15 minutes off.  You may switch to heat after 24 hours.Opioid Overdose Opioids are substances that relieve pain by binding to pain receptors in your brain and spinal cord. Opioids include illegal drugs, such as heroin, as well as prescription pain medicines.An opioid overdose happens when you take too much of an opioid substance. This can happen with any type of opioid,  including:  Heroin.  Morphine.  Codeine.  Methadone.  Oxycodone.  Hydrocodone.  Fentanyl.  Hydromorphone.  Buprenorphine. The effects of an overdose can be mild, dangerous, or even deadly. Opioid overdose is a medical emergency. What are the causes? This condition may be caused by:  Taking too much of an opioid by accident.  Taking too much of an opioid on purpose.  An error made by a health care provider who prescribes a medicine.  An error made by the pharmacist who fills the prescription order.  Using more than one substance that contains opioids at the same time.  Mixing an opioid with a substance that affects your heart, breathing, or blood pressure. These include alcohol, tranquilizers, sleeping pills, illegal drugs, and some over-the-counter medicines. What increases the risk? This condition is more likely in:  Children. They may be attracted to colorful pills. Because of a child's small size, even a small amount of a drug can be dangerous.  Elderly people. They may be taking many different drugs. Elderly people may have difficulty reading labels or remembering when they last took their medicine.  People who take an opioid on a long-term basis.  People who use:  Illegal drugs.  Other substances, including alcohol, while using an opioid.  People who have:  A history of drug or alcohol abuse.  Certain mental health conditions.  People who take opioids that are not prescribed for them. What are the signs or symptoms? Symptoms of this condition depend on the type of opioid and the amount that was taken. Common symptoms include:  Sleepiness or difficulty waking from sleep.  Confusion.  Slurred speech.  Slowed breathing and a slow pulse.  Nausea and vomiting.  Abnormally small pupils. Signs and symptoms that require emergency treatment include:  Cold, clammy, and pale skin.  Blue lips and fingernails.  Vomiting.  Gurgling sounds in the  throat.  A pulse that is very slow or difficult to detect.  Breathing that is very slow, noisy, or difficult to detect.  Limp body.  Inability to respond to speech or be awakened from sleep (stupor). How is this diagnosed? This condition is diagnosed based on your symptoms. It is important to tell your health care provider:  All of the opioidsthat you took.  When you took the opioids.  Whether you were drinking alcohol or using other substances. Your health care provider will do a physical exam. This exam may include:  Checking and monitoring your heart rate and rhythm, your breathing rate and depth, your temperature, and your blood pressure (vital signs).  Checking for abnormally small pupils.  Measuring oxygen levels in your blood. You may also have blood tests or urine tests. How is this treated? Supporting your vital signs and your breathing is the first step in treating an opioid overdose. Treatment may also include:  Giving fluids and minerals (electrolytes) through an IV tube.  Inserting a breathing tube (endotracheal tube) in your airway to help you breathe.  Giving oxygen.  Passing a tube through your nose and into your stomach (NG tube, or nasogastric tube) to wash out your stomach.  Giving medicines that:  Increase your blood pressure.  Absorb any opioid that is in your digestive system.  Reverse the effects of the opioid (naloxone).  Ongoing counseling and mental health support if you intentionally overdosed or used an illegal drug. Follow these instructions at home:  Take over-the-counter and prescription medicines only as told by your health care provider. Always ask your health care provider about possible side effects and interactions of any new medicine that you start taking.  Keep a list of all of the medicines that you take, including over-the-counter medicines. Bring this list with you to all of your medical visits.  Drink enough fluid to keep  your urine clear or pale yellow.  Keep all follow-up visits as told by your health care provider. This is important. How is this prevented?  Get help if you are struggling with:  Alcohol or drug use.  Depression or another mental health problem.  Keep the phone number of your local poison control center near your phone or on your cell phone.  Store all medicines in safety containers that are out of the reach of children.  Read the drug inserts that come with your medicines.  Do not drink alcohol when taking opioids.  Do not use illegal drugs.  Do not take opioid medicines that are not prescribed for you. Contact a health care provider if:  Your symptoms return.  You develop new symptoms or side effects when you are taking medicines. Get help right away if:  You think that you or someone else may have taken too much of an opioid. The hotline of the [facility] is (423)249-0092.  You or someone else is having symptoms of an opioid overdose.  You have serious thoughts about hurting yourself or others.  You have:  Chest pain.  Difficulty breathing.  A loss of consciousness. Opioid overdose is an emergency. Do not wait to see if the symptoms will go  away. Get medical help right away. Call your local emergency services (911 in the [place] Do not drive yourself to the hospital. This information is not intended to replace advice given to you by your health care provider. Make sure you discuss any questions you have with your health care provider. Document Released: 11/02/2004 Document Revised: 03/02/2016 Document Reviewed: 03/11/2015 Elsevier Interactive Patient Education  2017 [employer].

## 2017-01-02 NOTE — Progress Notes (Signed)
Patient's Name: Logan Perez  MRN: [id number]  Referring Provider: Sherrin Daisy, MD  DOB: June 08, 1946  PCP: No primary care provider on file.  DOS: 01/02/2017  Note by: Kathlen Brunswick. Dossie Arbour, MD  Service setting: Ambulatory outpatient  Location: [facility] (AMB) Pain Management Facility  Visit type: Procedure  Specialty: Interventional Pain Management  Patient type: Established   Primary Reason for Visit: Interventional Pain Management Treatment. CC: Back Pain (low)  Procedure:  Intrathecal Drug Delivery System (IDDS):  Type: Reservoir Refill [phone]) No rate change Region: Abdominal Laterality: Right  Type of Pump: Medtronic Synchromed II (MRI-compatible) Delivery Route: Intrathecal Type of Pain Treated: Neuropathic/Nociceptive Primary Medication Class: Opioid/opiate  Medication, Concentration, Infusion Program, & Delivery Rate: Please see scanned programming printout.  Indications: 1. Cancer associated pain   2. Compression fracture of T12 vertebra (HCC) (70-75% magnitude) (with mild retropulsion)   3. Multiple myeloma in remission ([place])   4. Presence of implanted infusion pump (Medtronic, programmable, intrathecal pump)   5. Encounter for adjustment or management of infusion pump   6. Chronic pain syndrome   7. Long term current use of opiate analgesic   8. Opiate use    Pain Assessment: Self-Reported Pain Score: 2 /10             Reported level is compatible with observation.        Intrathecal Pump Therapy Assessment  Manufacturer: Medtronic Synchromed II Type: Programmable Volume: 40 mL reservoir MRI compatibility: Yes   Drug content:  Primary Medication Class: Opioid Primary Medication: PF-Fentanyl Secondary Medication: PF-Bupivacaine Other Medication: PF-Clonidine 300 g/mL   Programming:  Type: Simple continuous. See pump readout for details.   Changes:  Medication Change: None at this point Rate Change: No change in rate  Reported side-effects or adverse  reactions: None reported  Effectiveness: Described as relatively effective, allowing for increase in activities of daily living (ADL) Clinically meaningful improvement in function (CMIF): Sustained CMIF goals met  Plan: Pump refill today  Pre-op Assessment:  Previous date of service: 09/19/16 Service provided: Procedure (IT pump refill) Logan Perez is a 71 y.o. (year old), male patient, seen today for interventional treatment. He  has a past surgical history that includes Back surgery (1994); Knee arthroscopy (Left, 1992); Pain pump implantation (2012); stem cell implant (2008); Cataract extraction w/PHACO (Right, 05/02/2016); Cataract extraction w/PHACO (Left, 05/16/2016); Limbal stem cell transplant; and Porta Cath Insertion (N/A, 12/04/2016). His primarily concern today is the Back Pain (low)  Initial Vital Signs: Blood pressure (!) 159/72, pulse 82, temperature 97.5 F (36.4 C), resp. rate 18, height '5\' 7"'  (1.702 m), weight 150 lb (68 kg), SpO2 100 %. BMI: 23.49 kg/m  Risk Assessment: Allergies: Reviewed. He is allergic to azithromycin; zometa [zoledronic acid]; and rivaroxaban.  Allergy Precautions: None required Coagulopathies: "Reviewed. None identified.  Blood-thinner therapy: None at this time Active Infection(s): Reviewed. None identified. Logan Perez is afebrile  Site Confirmation: Logan Perez was asked to confirm the procedure and laterality before marking the site Procedure checklist: Completed Consent: Before the procedure and under the influence of no sedative(s), amnesic(s), or anxiolytics, the patient was informed of the treatment options, risks and possible complications. To fulfill our ethical and legal obligations, as recommended by the American Medical Association's Code of Ethics, I have informed the patient of my clinical impression; the nature and purpose of the treatment or procedure; the risks, benefits, and possible complications of the intervention; the alternatives,  including doing nothing; the risk(s) and benefit(s) of the alternative treatment(s)  or procedure(s); and the risk(s) and benefit(s) of doing nothing.  Logan Perez was provided with information about the general risks and possible complications associated with most interventional procedures. These include, but are not limited to: failure to achieve desired goals, infection, bleeding, organ or nerve damage, allergic reactions, paralysis, and/or death.  In addition, he was informed of those risks and possible complications associated to this particular procedure, which include, but are not limited to: damage to the implant; failure to decrease pain; local, systemic, or serious CNS infections, intraspinal abscess with possible cord compression and paralysis, or life-threatening such as meningitis; bleeding; organ damage; nerve injury or damage with subsequent sensory, motor, and/or autonomic system dysfunction, resulting in transient or permanent pain, numbness, and/or weakness of one or several areas of the body; allergic reactions, either minor or major life-threatening, such as anaphylactic or anaphylactoid reactions.  Furthermore, Logan Perez was informed of those risks and complications associated with the medications. These include, but are not limited to: allergic reactions (i.e.: anaphylactic or anaphylactoid reactions); endorphine suppression; bradycardia and/or hypotension; water retention and/or peripheral vascular relaxation leading to lower extremity edema and possible stasis ulcers; respiratory depression and/or shortness of breath; decreased metabolic rate leading to weight gain; swelling or edema; medication-induced neural toxicity; particulate matter embolism and blood vessel occlusion with resultant organ, and/or nervous system infarction; and/or intrathecal granuloma formation with possible spinal cord compression and permanent paralysis.  Before refilling the pump Logan Perez was informed  that some of the medications used in the devise may not be FDA approved for such use and therefore it constitutes an off-label use of the medications.  Finally, he was informed that Medicine is not an exact science; therefore, there is also the possibility of unforeseen or unpredictable risks and/or possible complications that may result in a catastrophic outcome. The patient indicated having understood very clearly. We have given the patient no guarantees and we have made no promises. Enough time was given to the patient to ask questions, all of which were answered to the patient's satisfaction. Mr. Remer has indicated that he wanted to continue with the procedure. Attestation: I, the ordering provider, attest that I have discussed with the patient the benefits, risks, side-effects, alternatives, likelihood of achieving goals, and potential problems during recovery for the procedure that I have provided informed consent. Date: 01/02/2017; Time: 11:57 AM  Pre-Procedure Preparation:  Monitoring: As per clinic protocol. Respiration, ETCO2, SpO2, BP, heart rate and rhythm monitor placed and checked for adequate function Safety Precautions: Patient was assessed for positional comfort and pressure points before starting the procedure. Time-out: I initiated and conducted the "Time-out" before starting the procedure, as per protocol. The patient was asked to participate by confirming the accuracy of the "Time Out" information. Verification of the correct person, site, and procedure were performed and confirmed by me, the nursing staff, and the patient. "Time-out" conducted as per Joint Commission's Universal Protocol (UP.01.01.01). "Time-out" Date & Time: 01/02/2017; 1134 hrs.  Description of Procedure Process:   Position: Supine Target Area: Central-port of intrathecal pump. Approach: Anterior, 90 degree angle approach. Area Prepped: Entire Area around the pump implant. Prepping solution: ChloraPrep (2%  chlorhexidine gluconate and 70% isopropyl alcohol) Safety Precautions: Aspiration looking for blood return was conducted prior to all injections. At no point did we inject any substances, as a needle was being advanced. No attempts were made at seeking any paresthesias. Safe injection practices and needle disposal techniques used. Medications properly checked for expiration dates. SDV (single dose  vial) medications used. Description of the Procedure: Protocol guidelines were followed. Two nurses trained to do implant refills were present during the entire procedure. The refill medication was checked by both healthcare providers as well as the patient. The patient was included in the "Time-out" to verify the medication. The patient was placed in position. The pump was identified. The area was prepped in the usual manner. The sterile template was positioned over the pump, making sure the side-port location matched that of the pump. Both, the pump and the template were held for stability. The needle provided in the Medtronic Kit was then introduced thru the center of the template and into the central port. The pump content was aspirated and discarded volume documented. The new medication was slowly infused into the pump, thru the filter, making sure to avoid overpressure of the device. The needle was then removed and the area cleansed, making sure to leave some of the prepping solution back to take advantage of its long term bactericidal properties. The pump was interrogated and programmed to reflect the correct medication, volume, and dosage. The program was printed and taken to the physician for approval. Once checked and signed by the physician, a copy was provided to the patient and another scanned into the EMR. Vitals:   01/02/17 1108 01/02/17 1109  BP:  (!) 159/72  Pulse:  82  Resp:  18  Temp:  97.5 F (36.4 C)  SpO2:  100%  Weight: 150 lb (68 kg)   Height: '5\' 7"'  (1.702 m)     Start Time: 1134  hrs. End Time: 1153 (Wasted 66m in sink verified by myself and KTice,RN) hrs. Materials & Medications: Medtronic Refill Kit Medication(s): Please see chart orders for details.  Imaging Guidance:  Type of Imaging Technique: None used Indication(s): N/A Exposure Time: No patient exposure Contrast: None used. Fluoroscopic Guidance: N/A Ultrasound Guidance: N/A Interpretation: N/A  Antibiotic Prophylaxis:  Indication(s): None identified Antibiotic given: None  Post-operative Assessment:  EBL: None Complications: No immediate post-treatment complications observed by team, or reported by patient. Note: The patient tolerated the entire procedure well. A repeat set of vitals were taken after the procedure and the patient was kept under observation following institutional policy, for this type of procedure. Post-procedural neurological assessment was performed, showing return to baseline, prior to discharge. The patient was provided with post-procedure discharge instructions, including a section on how to identify potential problems. Should any problems arise concerning this procedure, the patient was given instructions to immediately contact [place] at any time, without hesitation. In any case, we plan to contact the patient by telephone for a follow-up status report regarding this interventional procedure. Comments:  No additional relevant information.  Plan of Care  Disposition: Discharge home  Discharge Date & Time: 01/02/2017;   hrs.  Physician-requested Follow-up:  Return for Pump Refill (as per pump program).  Future Appointments Date Time Provider [place] 01/03/2017 9:45 AM MPura Spice PT [facility] None  01/04/2017 8:30 AM [facility] None  01/04/2017 8:45 AM MLequita Asal MD [facility] None  01/04/2017 9:15 AM [facility] INFUSION CHAIR 8 [facility] None  01/08/2017 9:45 AM MPura Spice PT [facility] None  01/11/2017 9:45 AM MPura Spice PT [facility]  None  04/10/2017 10:45 AM FMilinda Pointer MD [facility] None  05/01/2017 8:30 AM SNori Riis PA-C [facility] None   Medications ordered for procedure: No orders of the defined types were placed in this encounter.  Medications administered: Mr. JCredit  had no medications administered during this visit.  See the medical record for exact dosing, route, and time of administration.  Lab-work, Procedure(s), & Referral(s) Ordered: No orders of the defined types were placed in this encounter.  Imaging Ordered: No results found for this or any previous visit. New Prescriptions   No medications on file   Primary Care Physician: No primary care provider on file. Location: [facility] Note by: Gerrianne Aydelott A. Dossie Arbour, M.D, DABA, DABAPM, DABPM, DABIPP, FIPP Date: 01/02/2017; Time: 3:57 PM  Disclaimer:  Medicine is not an exact science. The only guarantee in medicine is that nothing is guaranteed. It is important to note that the decision to proceed with this intervention was based on the information collected from the patient. The Data and conclusions were drawn from the patient's questionnaire, the interview, and the physical examination. Because the information was provided in large part by the patient, it cannot be guaranteed that it has not been purposely or unconsciously manipulated. Every effort has been made to obtain as much relevant data as possible for this evaluation. It is important to note that the conclusions that lead to this procedure are derived in large part from the available data. Always take into account that the treatment will also be dependent on availability of resources and existing treatment guidelines, considered by other Pain Management Practitioners as being common knowledge and practice, at the time of the intervention. For Medico-Legal purposes, it is also important to point out that variation in procedural techniques and pharmacological choices  are the acceptable norm. The indications, contraindications, technique, and results of the above procedure should only be interpreted and judged by a Board-Certified Interventional Pain Specialist with extensive familiarity and expertise in the same exact procedure and technique. Attempts at providing opinions without similar or greater experience and expertise than that of the treating physician will be considered as inappropriate and unethical, and shall result in a formal complaint to the state medical board and applicable specialty societies.  Instructions provided at this appointment: Patient Instructions  Pain Management Discharge Instructions  General Discharge Instructions :  If you need to reach your doctor call: Monday-Friday 8:00 am - 4:00 pm at [phone] or toll free [phone].  After clinic hours [phone] to have operator reach doctor.  Bring all of your medication bottles to all your appointments in the pain clinic.  To cancel or reschedule your appointment with Pain Management please remember to call 24 hours in advance to avoid a fee.  Refer to the educational materials which you have been given on: General Risks, I had my Procedure. Discharge Instructions, Post Sedation.  Post Procedure Instructions:  The drugs you were given will stay in your system until tomorrow, so for the next 24 hours you should not drive, make any legal decisions or drink any alcoholic beverages.  You may eat anything you prefer, but it is better to start with liquids then soups and crackers, and gradually work up to solid foods.  Please notify your doctor immediately if you have any unusual bleeding, trouble breathing or pain that is not related to your normal pain.  Depending on the type of procedure that was done, some parts of your body may feel week and/or numb.  This usually clears up by tonight or the next day.  Walk with the use of an assistive device or accompanied by an adult  for the 24 hours.  You may use ice on the affected area for the first 24  hours.  Put ice in a Ziploc bag and cover with a towel and place against area 15 minutes on 15 minutes off.  You may switch to heat after 24 hours.Opioid Overdose Opioids are substances that relieve pain by binding to pain receptors in your brain and spinal cord. Opioids include illegal drugs, such as heroin, as well as prescription pain medicines.An opioid overdose happens when you take too much of an opioid substance. This can happen with any type of opioid, including:  Heroin.  Morphine.  Codeine.  Methadone.  Oxycodone.  Hydrocodone.  Fentanyl.  Hydromorphone.  Buprenorphine. The effects of an overdose can be mild, dangerous, or even deadly. Opioid overdose is a medical emergency. What are the causes? This condition may be caused by:  Taking too much of an opioid by accident.  Taking too much of an opioid on purpose.  An error made by a health care provider who prescribes a medicine.  An error made by the pharmacist who fills the prescription order.  Using more than one substance that contains opioids at the same time.  Mixing an opioid with a substance that affects your heart, breathing, or blood pressure. These include alcohol, tranquilizers, sleeping pills, illegal drugs, and some over-the-counter medicines. What increases the risk? This condition is more likely in:  Children. They may be attracted to colorful pills. Because of a child's small size, even a small amount of a drug can be dangerous.  Elderly people. They may be taking many different drugs. Elderly people may have difficulty reading labels or remembering when they last took their medicine.  People who take an opioid on a long-term basis.  People who use:  Illegal drugs.  Other substances, including alcohol, while using an opioid.  People who have:  A history of drug or alcohol abuse.  Certain mental health  conditions.  People who take opioids that are not prescribed for them. What are the signs or symptoms? Symptoms of this condition depend on the type of opioid and the amount that was taken. Common symptoms include:  Sleepiness or difficulty waking from sleep.  Confusion.  Slurred speech.  Slowed breathing and a slow pulse.  Nausea and vomiting.  Abnormally small pupils. Signs and symptoms that require emergency treatment include:  Cold, clammy, and pale skin.  Blue lips and fingernails.  Vomiting.  Gurgling sounds in the throat.  A pulse that is very slow or difficult to detect.  Breathing that is very slow, noisy, or difficult to detect.  Limp body.  Inability to respond to speech or be awakened from sleep (stupor). How is this diagnosed? This condition is diagnosed based on your symptoms. It is important to tell your health care provider:  All of the opioidsthat you took.  When you took the opioids.  Whether you were drinking alcohol or using other substances. Your health care provider will do a physical exam. This exam may include:  Checking and monitoring your heart rate and rhythm, your breathing rate and depth, your temperature, and your blood pressure (vital signs).  Checking for abnormally small pupils.  Measuring oxygen levels in your blood. You may also have blood tests or urine tests. How is this treated? Supporting your vital signs and your breathing is the first step in treating an opioid overdose. Treatment may also include:  Giving fluids and minerals (electrolytes) through an IV tube.  Inserting a breathing tube (endotracheal tube) in your airway to help you breathe.  Giving oxygen.  Passing a tube  through your nose and into your stomach (NG tube, or nasogastric tube) to wash out your stomach.  Giving medicines that:  Increase your blood pressure.  Absorb any opioid that is in your digestive system.  Reverse the effects of the opioid  (naloxone).  Ongoing counseling and mental health support if you intentionally overdosed or used an illegal drug. Follow these instructions at home:  Take over-the-counter and prescription medicines only as told by your health care provider. Always ask your health care provider about possible side effects and interactions of any new medicine that you start taking.  Keep a list of all of the medicines that you take, including over-the-counter medicines. Bring this list with you to all of your medical visits.  Drink enough fluid to keep your urine clear or pale yellow.  Keep all follow-up visits as told by your health care provider. This is important. How is this prevented?  Get help if you are struggling with:  Alcohol or drug use.  Depression or another mental health problem.  Keep the phone number of your local poison control center near your phone or on your cell phone.  Store all medicines in safety containers that are out of the reach of children.  Read the drug inserts that come with your medicines.  Do not drink alcohol when taking opioids.  Do not use illegal drugs.  Do not take opioid medicines that are not prescribed for you. Contact a health care provider if:  Your symptoms return.  You develop new symptoms or side effects when you are taking medicines. Get help right away if:  You think that you or someone else may have taken too much of an opioid. The hotline of the [facility] is 7174901318.  You or someone else is having symptoms of an opioid overdose.  You have serious thoughts about hurting yourself or others.  You have:  Chest pain.  Difficulty breathing.  A loss of consciousness. Opioid overdose is an emergency. Do not wait to see if the symptoms will go away. Get medical help right away. Call your local emergency services (911 in the [place] Do not drive yourself to the hospital. This information is not intended to replace  advice given to you by your health care provider. Make sure you discuss any questions you have with your health care provider. Document Released: 11/02/2004 Document Revised: 03/02/2016 Document Reviewed: 03/11/2015 Elsevier Interactive Patient Education  2017 [employer].

## 2017-01-02 NOTE — Progress Notes (Signed)
Safety precautions to be maintained throughout the outpatient stay will include: orient to surroundings, keep bed in low position, maintain call bell within reach at all times, provide assistance with transfer out of bed and ambulation.  

## 2017-01-03 ENCOUNTER — Ambulatory Visit: Payer: Medicare Other | Admitting: Physical Therapy

## 2017-01-03 DIAGNOSIS — R2689 Other abnormalities of gait and mobility: Secondary | ICD-10-CM

## 2017-01-03 DIAGNOSIS — R262 Difficulty in walking, not elsewhere classified: Secondary | ICD-10-CM

## 2017-01-03 DIAGNOSIS — M6281 Muscle weakness (generalized): Secondary | ICD-10-CM

## 2017-01-03 DIAGNOSIS — R293 Abnormal posture: Secondary | ICD-10-CM

## 2017-01-03 NOTE — Therapy (Signed)
[facility] [address] [place], [place], [address] Phone: [phone]   Fax:  [phone]  Physical Therapy Treatment  Patient Details  Name: Logan Perez MRN: [id number] Date of Birth: [birth year] Referring Provider: Nolon Stalls, MD  Encounter Date: 01/03/2017      PT End of Session - 01/03/17 1807    Visit Number 21   Number of Visits 23   Date for PT Re-Evaluation 01/11/17   Authorization - Visit Number 21   Authorization - Number of Visits 24   PT Start Time 0944   PT Stop Time 1038   PT Time Calculation (min) 54 min   Equipment Utilized During Treatment Gait belt   Activity Tolerance Patient tolerated treatment well   Behavior During Therapy [facility] for tasks assessed/performed      Past Medical History:  Diagnosis Date  . Anxiety   . Atrial fibrillation ([place])   . BPH (benign prostatic hyperplasia)   . Complication of anesthesia    BAD HEADACHE NIGHT OF FIRST CATARACT  . Difficulty voiding   . Dysrhythmia    A FIB  . Elevated PSA   . GERD (gastroesophageal reflux disease)   . HLD (hyperlipidemia)   . HOH (hard of hearing)   . Hypertension   . Multiple myeloma ([place])   . Neuropathy ([place])   . Pain    BACK  . Palpitations   . Pneumonia   . Pulmonary embolism ([place])   . Stroke [facility])    TIA    Past Surgical History:  Procedure Laterality Date  . BACK SURGERY  1994  . CATARACT EXTRACTION W/PHACO Right 05/02/2016   Procedure: CATARACT EXTRACTION PHACO AND INTRAOCULAR LENS PLACEMENT (IOC);  Surgeon: Birder Robson, MD;  Location: [facility] ORS;  Service: Ophthalmology;  Laterality: Right;  [place] 1.06 AP% 20.6 CDE 13.70 FLUID PACK LOT # [id number] H  . CATARACT EXTRACTION W/PHACO Left 05/16/2016   Procedure: CATARACT EXTRACTION PHACO AND INTRAOCULAR LENS PLACEMENT (IOC);  Surgeon: Birder Robson, MD;  Location: [facility] ORS;  Service: Ophthalmology;  Laterality: Left;  [place] 01:43 AP% 19.8 CDE 20.45 FLUID PACK LOT  #[id number] H  . KNEE ARTHROSCOPY Left 1992  . LIMBAL STEM CELL TRANSPLANT    . PAIN PUMP IMPLANTATION  2012  . PORTA CATH INSERTION N/A 12/04/2016   Procedure: Glori Luis Cath Insertion;  Surgeon: Algernon Huxley, MD;  Location: [place] [facility];  Service: Cardiovascular;  Laterality: N/A;  . stem cell implant  2008   [facility]    There were no vitals filed for this visit.      Subjective Assessment - 01/03/17 0954    Subjective Pt. had his pump refilled yesterday. Increased length of walking.    Pertinent History Pt. has multiple myeloma (dx 2008.) Pt. was unable to walk for a while afterward but did PT for ~6 mo. after. Pt states cancer went into remission, no meds 2008-2011, cancer returned 2011/2012. Hx of 4 compression fx. Took chemo drug for 5 years which was beneficial, not currently taking any chemo. Sept 2017 pt had 2nd stem cell transplant, recently pt has returned to going out in public. Before stem cell transplant, pt reports he could walk a mile, but now only walks to mailbox and back since he has lost his endurance. Pt progressed well with home health PT going up and down stairs. Reports 3/10 low back pain and takes an Aleve in the a.m.  Pt has a pain pump for spine for daily  use.    Patient Stated Goals Pt. would like to return to a gym-based program to begin regaining general strength    Currently in Pain? Yes   Pain Score 1    Pain Location Back   Pain Orientation Lower       TherEx Nustep(cool down for functional capacity) 10 minutes no charge forward, backwards, sideways walking in // bars Standing marching in // bars side stepping up and down stairs Squatting 15x at // bars with chair behind Side stepping on floor ladder to promote body awareness  Neuro Airex balance beam : balance beam walking 8x , side walking 6x Cone touch with focus on upright posture. Calling out numbers PT holds up to promote head upright posture cross body hand to knee 64 ftx2 Cross body UE LE  against wall reaching for cones in increasingly more tandem stance position, L and R hand, 4x 15 cones star (3 directions at a time) 8x each leg.    Pt response for medical necessity: Pt. Will continue to benefit from skilled PT in order to keep progressing LE strengthening,balance training, and capacity for functional activities of daily life.       PT Long Term Goals - 12/14/16 1000      PT LONG TERM GOAL #1   Title Pt. will score a 56/56 on the Berg Balance Assessment in order to improve functional mobility and balance.    Baseline Ongoing,  Pt scored a 54/56 10/19/15.  Pt scored a 55/56 on 2/13/`17., 3/8: 55/56   Time 4   Period Weeks   Status On-going     PT LONG TERM GOAL #2   Title Pt will demonstrate a normalized gait pattern with a wider base of support in order to improve overall mobility.    Baseline Patient is ambulating with a functional base of support, improving his confidence in ambulating.    Time 4   Period Weeks   Status Achieved     PT LONG TERM GOAL #3   Title Pt will ambulate for 10 minutes safely without fatigue in order to increase endurance while in the community.    Baseline Patient ambulates 10 minutes safely without fatigue.    Time 4   Period Weeks   Status Achieved     PT LONG TERM GOAL #4   Title Pt. will participate in a gym-based exercise program with no limitations to promote independence.    Baseline Patient unable to return to gym at this time due to fear of exposure while on medical tx due to low immune system.    Time 4   Period Weeks   Status On-going     PT LONG TERM GOAL #5   Title Pt will ambulate for 20 minutes safely without fatigue in order to increase endurance while in the community and walking dog.    Baseline 10 minutes on treadmill level 2.2-3.2    Time 4   Period Weeks   Status On-going     Additional Long Term Goals   Additional Long Term Goals Yes     PT LONG TERM GOAL #6   Title Patient will squat to pick up  object from floor and return to standing 10x without LOB to increase safety with activities of daily living.   Baseline squat to pick up cone off ground 10x no LOB   Time 4   Period Weeks   Status Achieved     PT LONG TERM GOAL #7  Title Patient will complete 10 step ups onto 6" step without UE assist or LOB to increase mobility in the community.    Baseline Step up 10x on 6" step without UE assistance on left (difficulty on right)   Time 4   Period Weeks   Status Achieved     PT LONG TERM GOAL #8   Title Patient will perform single limb stance for 15 seconds on RLE.    Baseline 3/8: 6 seconds    Time 4   Period Weeks   Status New     PT LONG TERM GOAL  #9   TITLE Patient will perform tandem stance for 15 seconds without LOB with one foot in front of the other touching.    Baseline Patient unable to have one foot in front of the other touching, only with some space between.    Time 4   Period Weeks   Status New            Plan - 01/03/17 1813    Clinical Impression Statement Pt. had pain pump refilled yesterday and is feeling much better today. Cross body coordination challenges pt.'s balance causing occasional LOB. Balance beam Airex pad was challenging to pt. but pt. demonstrated improved ability to negotiate the unstable surface with less frequent LOB with repetition. Tandem stance is improving with pt. able to perform cone reach tasks with minimal trunk sway. LE strengthening activities continue to challenge pt and pt.'s functional capacity for activities. Pt. will continue to benefit from skilled physical therapy to improve safety, generalized mobility, and quality of life.    Rehab Potential Good   Clinical Impairments Affecting Rehab Potential motivation, active lifestyle, family support.    PT Frequency 2x / week   PT Duration 4 weeks   PT Treatment/Interventions Gait training;Functional mobility training;Therapeutic exercise;Balance training;Neuromuscular re-education    PT Next Visit Plan cross body exercises, RECERT/DC?   PT Home Exercise Plan See handouts   Consulted and Agree with Plan of Care Patient      Patient will benefit from skilled therapeutic intervention in order to improve the following deficits and impairments:  Abnormal gait, Postural dysfunction, Decreased activity tolerance, Decreased endurance, Decreased strength, Decreased balance, Difficulty walking  Visit Diagnosis: Muscle weakness (generalized)  Difficulty in walking, not elsewhere classified  Imbalance  Abnormal posture     Problem List Patient Active Problem List   Diagnosis Date Noted  . Immunocompromised state associated with stem cell transplant ([place]) 09/26/2016  . Multiple myeloma in remission ([place]) 09/19/2016  . Xerostomia 08/23/2016  . Moderate protein-calorie malnutrition ([place]) 08/20/2016  . Thrombocytopenia ([place]) 08/20/2016  . Anemia 08/20/2016  . Nausea without vomiting 08/20/2016  . Atrial fibrillation ([place]) 07/10/2016  . 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 H/O autologous stem cell transplant ([place]) 07/10/2016  . Bone cancer ([place]) 06/15/2016  . Porokeratosis 04/06/2016  . Encounter for adjustment or management of infusion pump 03/23/2016  . Hypomagnesemia 12/01/2015  . Ejection fraction < 50% 11/30/2015  . Presence of intrathecal pump 08/26/2015  . Long term current use of opiate analgesic 08/26/2015  . Long term prescription opiate use 08/26/2015  . Opiate use 08/26/2015  . Encounter for therapeutic drug level monitoring 08/26/2015  . Night muscle spasms 08/26/2015  . Muscle spasticity 08/26/2015  . Neuropathic pain 08/26/2015  . Neurogenic pain 08/26/2015  . Chronic low back pain 08/26/2015  . Lumbar spondylosis 08/26/2015  . Compression fracture of T12 vertebra (HCC) (70-75% magnitude) (with  mild retropulsion) 08/26/2015  . Diffuse myofascial pain syndrome 08/26/2015  . Presence of implanted infusion  pump ([employer]tronic, programmable, intrathecal pump) 08/26/2015  . Cancer associated pain 08/26/2015  . Encounter for interrogation of infusion pump 08/26/2015  . Chronic lumbar radicular pain 08/26/2015  . Opiate analgesic contract exists 08/26/2015  . Chronic pain syndrome 08/26/2015  . Chronic anticoagulation (Eliquis) 08/26/2015  . Clostridium difficile diarrhea 07/21/2015  . Diarrhea 07/19/2015  . Multiple myeloma ([place]) 05/04/2015  . BPH with obstruction/lower urinary tract symptoms 04/29/2015  . MI (mitral incompetence) 12/03/2014  . TI (tricuspid incompetence) 12/03/2014  . Paroxysmal atrial fibrillation ([place]) 12/03/2014  . Breathlessness on exertion 12/03/2014  . Essential (primary) hypertension 01/29/2014  . Acid reflux 01/29/2014  . Combined fat and carbohydrate induced hyperlipemia 01/29/2014  . Pulmonary embolism ([place]) 01/29/2014   Pura Spice, PT, DPT # [id number] Janna Arch, SPT 01/04/2017, 7:56 AM  [place] [facility] [address] [place], [place], [address] Phone: [phone]   Fax:  [phone]  Name: Revis Whalin MRN: [id number] Date of Birth: [birth year]

## 2017-01-04 ENCOUNTER — Inpatient Hospital Stay: Payer: Medicare Other | Attending: Hematology and Oncology

## 2017-01-04 ENCOUNTER — Encounter: Payer: Self-pay | Admitting: Hematology and Oncology

## 2017-01-04 ENCOUNTER — Inpatient Hospital Stay (HOSPITAL_BASED_OUTPATIENT_CLINIC_OR_DEPARTMENT_OTHER): Payer: Medicare Other | Admitting: Hematology and Oncology

## 2017-01-04 ENCOUNTER — Encounter: Payer: Medicare Other | Admitting: Physical Therapy

## 2017-01-04 ENCOUNTER — Inpatient Hospital Stay: Payer: Medicare Other

## 2017-01-04 ENCOUNTER — Encounter: Payer: Self-pay | Admitting: Physical Therapy

## 2017-01-04 ENCOUNTER — Other Ambulatory Visit: Payer: Self-pay | Admitting: Hematology and Oncology

## 2017-01-04 VITALS — BP 150/82 | HR 80 | Temp 95.8°F | Resp 18 | Wt 151.3 lb

## 2017-01-04 DIAGNOSIS — Z8673 Personal history of transient ischemic attack (TIA), and cerebral infarction without residual deficits: Secondary | ICD-10-CM | POA: Insufficient documentation

## 2017-01-04 DIAGNOSIS — K219 Gastro-esophageal reflux disease without esophagitis: Secondary | ICD-10-CM

## 2017-01-04 DIAGNOSIS — E785 Hyperlipidemia, unspecified: Secondary | ICD-10-CM | POA: Diagnosis not present

## 2017-01-04 DIAGNOSIS — M549 Dorsalgia, unspecified: Secondary | ICD-10-CM | POA: Diagnosis not present

## 2017-01-04 DIAGNOSIS — Z87891 Personal history of nicotine dependence: Secondary | ICD-10-CM

## 2017-01-04 DIAGNOSIS — D638 Anemia in other chronic diseases classified elsewhere: Secondary | ICD-10-CM | POA: Diagnosis not present

## 2017-01-04 DIAGNOSIS — Z5112 Encounter for antineoplastic immunotherapy: Secondary | ICD-10-CM

## 2017-01-04 DIAGNOSIS — Z9484 Stem cells transplant status: Secondary | ICD-10-CM | POA: Insufficient documentation

## 2017-01-04 DIAGNOSIS — Z79899 Other long term (current) drug therapy: Secondary | ICD-10-CM

## 2017-01-04 DIAGNOSIS — D696 Thrombocytopenia, unspecified: Secondary | ICD-10-CM

## 2017-01-04 DIAGNOSIS — M25551 Pain in right hip: Secondary | ICD-10-CM

## 2017-01-04 DIAGNOSIS — I10 Essential (primary) hypertension: Secondary | ICD-10-CM

## 2017-01-04 DIAGNOSIS — N400 Enlarged prostate without lower urinary tract symptoms: Secondary | ICD-10-CM

## 2017-01-04 DIAGNOSIS — I4891 Unspecified atrial fibrillation: Secondary | ICD-10-CM | POA: Diagnosis not present

## 2017-01-04 DIAGNOSIS — Z86711 Personal history of pulmonary embolism: Secondary | ICD-10-CM | POA: Insufficient documentation

## 2017-01-04 DIAGNOSIS — C9001 Multiple myeloma in remission: Secondary | ICD-10-CM

## 2017-01-04 DIAGNOSIS — Z8701 Personal history of pneumonia (recurrent): Secondary | ICD-10-CM

## 2017-01-04 DIAGNOSIS — C9002 Multiple myeloma in relapse: Secondary | ICD-10-CM

## 2017-01-04 DIAGNOSIS — K117 Disturbances of salivary secretion: Secondary | ICD-10-CM

## 2017-01-04 DIAGNOSIS — G8929 Other chronic pain: Secondary | ICD-10-CM

## 2017-01-04 LAB — CBC WITH DIFFERENTIAL/PLATELET
Basophils Absolute: 0 10*3/uL (ref 0–0.1)
Basophils Relative: 0 %
Eosinophils Absolute: 0 10*3/uL (ref 0–0.7)
Eosinophils Relative: 1 %
HCT: 31.3 % — ABNORMAL LOW (ref 40.0–52.0)
Hemoglobin: 11.2 g/dL — ABNORMAL LOW (ref 13.0–18.0)
Lymphocytes Relative: 17 %
Lymphs Abs: 0.9 10*3/uL — ABNORMAL LOW (ref 1.0–3.6)
MCH: 37.4 pg — ABNORMAL HIGH (ref 26.0–34.0)
MCHC: 35.8 g/dL (ref 32.0–36.0)
MCV: 104.4 fL — ABNORMAL HIGH (ref 80.0–100.0)
Monocytes Absolute: 0.3 10*3/uL (ref 0.2–1.0)
Monocytes Relative: 6 %
Neutro Abs: 4.1 10*3/uL (ref 1.4–6.5)
Neutrophils Relative %: 76 %
Platelets: 139 10*3/uL — ABNORMAL LOW (ref 150–440)
RBC: 3 MIL/uL — ABNORMAL LOW (ref 4.40–5.90)
RDW: 12.9 % (ref 11.5–14.5)
WBC: 5.5 10*3/uL (ref 3.8–10.6)

## 2017-01-04 LAB — COMPREHENSIVE METABOLIC PANEL
ALT: 18 U/L (ref 17–63)
AST: 21 U/L (ref 15–41)
Albumin: 3.8 g/dL (ref 3.5–5.0)
Alkaline Phosphatase: 52 U/L (ref 38–126)
Anion gap: 5 (ref 5–15)
BUN: 22 mg/dL — ABNORMAL HIGH (ref 6–20)
CO2: 24 mmol/L (ref 22–32)
Calcium: 9.1 mg/dL (ref 8.9–10.3)
Chloride: 108 mmol/L (ref 101–111)
Creatinine, Ser: 1.23 mg/dL (ref 0.61–1.24)
GFR calc Af Amer: >60 mL/min (ref >60)
GFR calc non Af Amer: 58 mL/min — ABNORMAL LOW (ref >60)
Glucose, Bld: 130 mg/dL — ABNORMAL HIGH (ref 65–99)
Potassium: 4 mmol/L (ref 3.5–5.1)
Sodium: 137 mmol/L (ref 135–145)
Total Bilirubin: 0.6 mg/dL (ref 0.3–1.2)
Total Protein: 6.2 g/dL — ABNORMAL LOW (ref 6.5–8.1)

## 2017-01-04 LAB — MAGNESIUM: Magnesium: 1.8 mg/dL (ref 1.7–2.4)

## 2017-01-04 MED ORDER — SODIUM CHLORIDE 0.9% FLUSH
10.0000 mL | INTRAVENOUS | Status: DC | PRN
  Administered 2017-01-04: 10 mL
  Filled 2017-01-04: qty 10

## 2017-01-04 MED ORDER — ACETAMINOPHEN 325 MG PO TABS
650.0000 mg | ORAL_TABLET | Freq: Once | ORAL | Status: AC
  Administered 2017-01-04: 650 mg via ORAL
  Filled 2017-01-04: qty 2

## 2017-01-04 MED ORDER — SODIUM CHLORIDE 0.9 % IV SOLN
Freq: Once | INTRAVENOUS | Status: AC
  Administered 2017-01-04: 10:00:00 via INTRAVENOUS
  Filled 2017-01-04: qty 1000

## 2017-01-04 MED ORDER — DIPHENHYDRAMINE HCL 25 MG PO CAPS
50.0000 mg | ORAL_CAPSULE | Freq: Once | ORAL | Status: AC
  Administered 2017-01-04: 50 mg via ORAL
  Filled 2017-01-04: qty 2

## 2017-01-04 MED ORDER — ONDANSETRON 8 MG PO TBDP
8.0000 mg | ORAL_TABLET | Freq: Once | ORAL | Status: AC
  Administered 2017-01-04: 8 mg via ORAL
  Filled 2017-01-04: qty 1

## 2017-01-04 MED ORDER — SODIUM CHLORIDE 0.9 % IV SOLN
1000.0000 mg | Freq: Once | INTRAVENOUS | Status: AC
  Administered 2017-01-04: 1000 mg via INTRAVENOUS
  Filled 2017-01-04: qty 50

## 2017-01-04 MED ORDER — LIDOCAINE-PRILOCAINE 2.5-2.5 % EX CREA: 1.0000 "application " | TOPICAL_CREAM | CUTANEOUS | 0 refills | Status: DC | PRN

## 2017-01-04 MED ORDER — HEPARIN SOD (PORK) LOCK FLUSH 100 UNIT/ML IV SOLN
500.0000 [IU] | Freq: Once | INTRAVENOUS | Status: AC | PRN
  Administered 2017-01-04: 500 [IU]
  Filled 2017-01-04: qty 5

## 2017-01-04 NOTE — Progress Notes (Signed)
Patient states that his nipples are sore.  This happened to him about 5 years ago and Dr. Oliva Bustard took him off his Revlimid for an additional week.  Also c/o a place on his right hip.  States he can move it around like a tendon.

## 2017-01-04 NOTE — Progress Notes (Signed)
[facility] day:  01/04/17  Chief Complaint: Logan Perez is a 71 y.o. 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  HPI: The patient was last seen in the medical oncology clinic by me on 11/28/2016.  At that time, he felt good.  He had xerostomia.  He was slowly gaining weight.  He continued physical therapy.  Counts continue to improve slowly.  Creatinine was 1.35.  Magnesium was 1.9.  He underwent port-a-cath placement on 12/04/2016.    He was seen by Dr. Grayland Ormond on 12/05/2016 for cycle #1 Darzalex post transplant on 12/05/2016.   During the interim, he has felt all right. He is receiving physical therapy twice a week. He notes some discomfort in his right hip. He is taking potassium one a day.  He notes some tenderness in his nipples.   Past Medical History:  Diagnosis Date  . Anxiety   . Atrial fibrillation ([place])   . BPH (benign prostatic hyperplasia)   . Complication of anesthesia    BAD HEADACHE NIGHT OF FIRST CATARACT  . Difficulty voiding   . Dysrhythmia    A FIB  . Elevated PSA   . GERD (gastroesophageal reflux disease)   . HLD (hyperlipidemia)   . HOH (hard of hearing)   . Hypertension   . Multiple myeloma ([place])   . Neuropathy ([place])   . Pain    BACK  . Palpitations   . Pneumonia   . Pulmonary embolism ([place])   . Stroke Logan Perez [facility])    TIA    Past Surgical History:  Procedure Laterality Date  . BACK SURGERY  1994  . CATARACT EXTRACTION W/PHACO Right 05/02/2016   Procedure: CATARACT EXTRACTION PHACO AND INTRAOCULAR LENS PLACEMENT (IOC);  Surgeon: Perez Robson, MD;  Location: [facility] ORS;  Service: Ophthalmology;  Laterality: Right;  [place] 1.06 AP% 20.6 CDE 13.70 FLUID PACK LOT # [id number]  . CATARACT EXTRACTION W/PHACO Left 05/16/2016   Procedure: CATARACT  EXTRACTION PHACO AND INTRAOCULAR LENS PLACEMENT (IOC);  Surgeon: Perez Robson, MD;  Location: [facility] ORS;  Service: Ophthalmology;  Laterality: Left;  [place] 01:43 AP% 19.8 CDE 20.45 FLUID PACK LOT #[id number] H  . KNEE ARTHROSCOPY Left 1992  . LIMBAL STEM CELL TRANSPLANT    . PAIN PUMP IMPLANTATION  2012  . PORTA CATH INSERTION N/A 12/04/2016   Procedure: Glori Luis Cath Insertion;  Surgeon: Logan Huxley, MD;  Location: [facility];  Service: Cardiovascular;  Laterality: N/A;  . stem cell implant  2008   [facility]    Family History  Problem Relation Age of Onset  . Cancer Father     throat  . Kidney disease Sister   . Stroke    . Bladder Cancer Neg Hx   . Prostate cancer Neg Hx     Social History:  reports that he has quit smoking. His smoking use included Cigarettes. He has a 30.00 pack-year smoking history. He has quit using smokeless tobacco. He reports that he does not drink alcohol. His drug history is not on file.  He has a wire haired dachshund.  He lives in [place].  His wife's name is Logan Perez.  The patient is alone today.  Allergies:  Allergies  Allergen Reactions  . Azithromycin Diarrhea    Possible cause of C. Diff  . Zometa [Zoledronic Acid] Other (  See Comments)    ONG- Osteonecrosis of the jaw   . Rivaroxaban Rash    Current Medications: Current Outpatient Prescriptions  Medication Sig Dispense Refill  . acetaminophen (TYLENOL) 500 MG tablet Take 1,000 mg by mouth every 8 (eight) hours as needed for mild pain or headache.     . ALPRAZolam (XANAX) 0.25 MG tablet TAKE 1 TABLET BY MOUTH EVERY NIGHT AT BEDTIME AS NEEDED FOR ANXIETY. 30 tablet 0  . apixaban (ELIQUIS) 5 MG TABS tablet TAKE 1 TABLET(5 MG) BY MOUTH TWICE DAILY 60 tablet 0  . baclofen (LIORESAL) 10 MG tablet Take 1 tablet (10 mg total) by mouth at bedtime. 30 each 3  . calcium citrate-vitamin D (CITRACAL+D) 315-200 MG-UNIT per tablet Take 1 tablet by mouth daily.     . cetirizine (ZYRTEC) 10 MG tablet Take 10 mg  by mouth daily as needed for allergies.     Logan Perez dexamethasone (DECADRON) 4 MG tablet Take 4 tabs PO 1 hr prior to infusion, then take 1 tab on day 2 and day 3 30 tablet 1  . diltiazem (CARDIZEM CD) 120 MG 24 hr capsule Take by mouth.    . diltiazem (CARDIZEM) 60 MG tablet Take 60 mg by mouth as needed (raised heart rate).     Logan Perez diltiazem (DILACOR XR) 120 MG 24 hr capsule Take 120 mg by mouth daily.    Logan Perez ENSURE (ENSURE) Take 237 mLs by mouth daily.    . fluticasone (FLONASE) 50 MCG/ACT nasal spray Place 1 spray into the nose daily as needed for allergies.     . Iron-Vitamins (GERITOL PO) Take 1 tablet by mouth daily.     Logan Perez loperamide (IMODIUM) 2 MG capsule Take 2-4 mg by mouth as needed for diarrhea or loose stools. Reported on 04/26/2016    . lovastatin (MEVACOR) 20 MG tablet Take 20 mg by mouth every evening.     . montelukast (SINGULAIR) 10 MG tablet TAKE 1 TABLET BY MOUTH ONCE A DAY AS DIRECTED. (Patient taking differently: Take 68ms daily the day before the infusion, the day of, and the 2 days after infusion) 30 tablet 0  . naproxen sodium (ANAPROX) 220 MG tablet Take 220-440 mg by mouth 2 (two) times daily as needed (pain).    .Logan Kitchenomeprazole (PRILOSEC) 20 MG capsule Take 20 mg by mouth daily.     . potassium chloride SA (K-DUR,KLOR-CON) 20 MEQ tablet TAKE 1 TABLET(20 MEQ) BY MOUTH TWICE DAILY (Patient taking differently: TAKE 1 TABLET(20 MEQ) BY MOUTH DAILY) 180 tablet 0  . prochlorperazine (COMPAZINE) 10 MG tablet Take 10 mg by mouth every 6 (six) hours as needed for nausea or vomiting.    . ranitidine (ZANTAC) 150 MG tablet Take 150 mg by mouth at bedtime.     . tamsulosin (FLOMAX) 0.4 MG CAPS capsule Take 1 capsule (0.4 mg total) by mouth daily. 90 capsule 4  . valACYclovir (VALTREX) 500 MG tablet TAKE 1 TABLET BY MOUTH DAILY 30 tablet 0  . PAIN MANAGEMENT IT PUMP REFILL 1 each by Intrathecal route once. Medication: PF Fentanyl 1,500.0 mcg/ml PF Bupivicaine 30.0 mg/ml PF Clonidine  300.09m/ml Total Volume: 40 ml Needed by 01-02-17 @ 1000 1 each 0   No current facility-administered medications for this visit.     Review of Systems:  GENERAL: Feels "ok".  Energy level improving.  No fevers or sweats.  Weight up 2 pounds. PERFORMANCE STATUS (ECOG): 1 HEENT:  Cataracts s/p surgery.  Dry mouth with altered taste sensation.  No sore throat, mouth sores or tenderness. Lungs:  No shortness of breath or cough.  No hemoptysis. Cardiac:  No chest pain, palpitations, orthopnea, or PND. GI:  Appetite good.  Nausea managed with Compazine.  Loose stool (chronic).  No vomiting, constipation, melena or hematochezia. GU:  No urgency, frequency, dysuria, or hematuria. Musculoskeletal:  Compression fracture of back on a pain pump. No muscle tenderness. Undergoing PT 2x/week. Extremities:  No pain or swelling. Skin:  Fragile skin.  Nipples tender.  No rashes or skin changes. Neuro:  Little tremor (chronic).  Neuropathy in feet.  No headache, weakness, balance or coordination issues. Endocrine:  No diabetes, thyroid issues, hot flashes or night sweats. Psych:  No mood changes, depression or anxiety. Pain:  Chronic back pain (pain well managed with pump). Review of systems:  All other systems reviewed and found to be negative.   Physical Exam: Blood pressure (!) 150/82, pulse 80, temperature (!) 95.8 F (35.4 C), temperature source Tympanic, resp. rate 18, weight 151 lb 5 oz (68.6 kg). GENERAL:  Thin elderly gentleman sitting comfortably in the exam room in no acute distress.  MENTAL STATUS:  Alert and oriented to person, place and time. HEAD:  Thin short brown hair.  Graying goatee with scruffy facial hair.  Temporal wasting.  Normocephalic, atraumatic, face symmetric, no Cushingoid features. EYES:  Glasses.  Blue eyes s/p cataract surgery.  Pupils equal round and reactive to light and accomodation.  No conjunctivitis or scleral icterus. ENT:  Hearing aide.  Oropharynx clear without  lesion.  Hearing aide.  Tongue normal. Mucous membranes moist.  RESPIRATORY:  Clear to auscultation without rales, wheezes or rhonchi. CARDIOVASCULAR:  Regular rate and rhythm without murmur, rub or gallop. ABDOMEN:  RUQ pain pump.  Soft, non-tender, with active bowel sounds, and no hepatosplenomegaly.  No masses. SKIN:  No rashes, ulcers or lesions. EXTREMITIES: Trace ankle edema.  No skin discoloration or tenderness.  No palpable cords. LYMPH NODES: No palpable cervical, supraclavicular, axillary or inguinal adenopathy  NEUROLOGICAL: Intention tremor. PSYCH:  Appropriate.    Appointment on 01/04/2017  Component Date Value Ref Range Status  . WBC 01/04/2017 5.5  3.8 - 10.6 K/uL Final  . RBC 01/04/2017 3.00* 4.40 - 5.90 MIL/uL Final  . Hemoglobin 01/04/2017 11.2* 13.0 - 18.0 g/dL Final  . HCT 01/04/2017 31.3* 40.0 - 52.0 % Final  . MCV 01/04/2017 104.4* 80.0 - 100.0 fL Final  . MCH 01/04/2017 37.4* 26.0 - 34.0 pg Final  . MCHC 01/04/2017 35.8  32.0 - 36.0 g/dL Final  . RDW 01/04/2017 12.9  11.5 - 14.5 % Final  . Platelets 01/04/2017 139* 150 - 440 K/uL Final  . Neutrophils Relative % 01/04/2017 76  % Final  . Neutro Abs 01/04/2017 4.1  1.4 - 6.5 K/uL Final  . Lymphocytes Relative 01/04/2017 17  % Final  . Lymphs Abs 01/04/2017 0.9* 1.0 - 3.6 K/uL Final  . Monocytes Relative 01/04/2017 6  % Final  . Monocytes Absolute 01/04/2017 0.3  0.2 - 1.0 K/uL Final  . Eosinophils Relative 01/04/2017 1  % Final  . Eosinophils Absolute 01/04/2017 0.0  0 - 0.7 K/uL Final  . Basophils Relative 01/04/2017 0  % Final  . Basophils Absolute 01/04/2017 0.0  0 - 0.1 K/uL Final    Assessment:  Culver Feighner is a 71 y.o. male with stage III mutiple myeloma.  He initially presented with progressive back pain beginning in 12/2006.  MRI revealed "spots and compression fractures".  He  began Velcade, thalidomide, and Decadron.  In 08/2007, he underwent high dose chemotherapy and autologous stem cell  transplant.  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  He recurred with a rising M-spike (2.7) with repeat M spike (1.7 gm/dl) in 03/2010.  He was initially treated with Velcade (02/08/2010 - 05/10/2010).  He then began Revlimid (15 mg 3 weeks on/1 week off) and Decadron (40 mg on day 1, 8, 15, 22).  Because of significant side effect with Decadron his dose was decreased to 10 mg once a week in 07/2010.    He was on maintenance Revlimid. Revlimid was initially10 mg 3 weeks on/1 week off. This was changed to 10 mg 2 weeks on/2 weeks off secondary to right nipple tenderness. His dose was increased to 10 mg 3 weeks on/1 week off with Decadron 10 mg a week (on Sundays) and then Revlamid 15 mg 3 weeks on and 1 week off with Decadron on Sundays.  He began Pomalyst 4 mg 3 weeks on/1 week off with Decadron on 08/27/2015.  Over the past year his SPEP has revealed no monoclonal protein (04/21/2015) and 0.5 gm/dL on 09/22/2015 and 10/20/2015. M spike was 0.1 on 02/02/2016, 03/01/2016, 03/29/2016, 04/26/2016, and 05/24/2016.  M spike was 0 on 10/11/2016 and 11/28/2016.  Free light chains have been monitored. Kappa free light chains were 18.54 on 11/21/2013, 18.37 on 02/20/2014, 18.93 on 05/22/2014, 32.58 (high; normal ratio 1.27) on 08/21/2014, 51.53 (high; elevated ratio of 2.12) on 11/13/2014, 28.08 (ratio 1.73) on 12/09/2014, 23.71 (ratio 2.17) on 01/18/2015, 92.93 (ratio 9.49) on 04/21/2015, 93.44 (ratio 10.28) on 05/26/2015, 255.45 (ratio of 24.05) on 07/14/2015, 373.89 (ratio 48.31) on 08/04/2015, 474.33 (ratio 70.58) on 08/25/2015, 450.76 (ratio 55.44) on 09/22/2015, 453.4 (ratio 59.89) on 10/20/2015, 58.26 (ratio of > 40.74) on 02/02/2016, 62.67 (ratio of 37.98) on 03/01/2016, 75.7 (ratio > 50.47) on 03/29/2016, 79.7 (ratio 53.13) on 04/26/2016, 108.6 (ratio > 72.4) on 05/24/2016, and 8.3 (1.46 ratio) on 10/11/2016.  Bone survey on 12/08/2014 was stable.  Bone survey on 10/21/2015  revealed increase conspicuity of subcentimeter lytic lesions in the calvarium.  Bone marrow aspirate and biopsy on 11/04/2015 revealed an atypical monoclonal plasma cells estimated at 30-40% of marrow cells.   Marrow was variably cellular (approximately 45%) with background trilineage hematopoiesis. There was no significant increase in marrow reticulin fibers. Storage iron was present.    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 He develoed herpes zoster in 04/2008. He developed a pulmonary embolism in 05/2013. He was initially on Xarelto, but is now on Eliquis. He had an episode of pneumonia around this time requiring a brief admission. He developed severe lower leg cramps on 08/07/2014 secondary to hypokalemia. Duplex was negative.   He was treated for C difficile colitis (Flagyl completed 07/30/2015).  He has a chronic indwelling pain pump.  He received 4 cycles of Pomalyst and Decadron (08/27/2015 - 11/19/2015).  Restaging studies document progressive disease.  Kappa free light chains are increasing.  SPEP revealed 0.5 gm/dL monoclonal protein then 1.3 gm/dL.  Bone survey reveals increase conspicuity of subcentimeter lytic lesions in the calvarium.  Bone marrow reveals 30-40% plasma cells.   MUGA on 11/30/2015 revealed an ejection fraction of 46%.  He is felt not to be a good candidate for Kyprolis.  There were no focal wall motion abnormalities.  He had a stress echo less than 1 year ago.  He has a history of PVCs and  atrial fibrillation.  He takes Cardizem prn.  He received 17 weeks of daratumumab (Darzalex) (12/09/2015 - 05/25/2016).  He tolerated treatment well without side effect.  Bone marrow on 02/09/2016 revealed no diagnostic morphologic evidence of plasma cell myeloma.  Marrow was normocellular to hypocellular marrow for age (ranging from 10-40%) with maturing trilineage hematopoiesis and mild multilineage dyspoiesis.  There  was patchy mild increase in reticulin.  Storage iron was present.  Flow cytometry revealed no definitive evidence of monoclonality.  There was a non-specific atypical myeloid and monocytic findings with no increase in blasts.  Cytogenetics were normal (46, XY).  He is currently day 198 s/p 2nd autologous stem cell transplant on 06/16/2016.  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  He also experienced atrial fibrillation with intermittent episodes of RVR requiring IV beta blockers.  He is on prophylactic valacyclovir for 1 year post transplant.  He is s/p 1 cycle of daratumumab (Darzalex) post transplant (12/05/2016).  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  Work-up on 08/23/2016 revealed the following normal studies:  B12 and folate.  Ferritin was 1379 (high).  Iron studies included a saturation of 56% and TIBC 141 (low).  ESR was 64.  Thrombocytopenia is improving slowly post transplant.  Platelet count is 149,000.  He has a history of persistent hypomagnesemia and receives IV magnesium (2-4 gm) weekly.  He has not required magnesium since 10/24/2016.  Symptomatically, he notes some right hip discomfort.  He has xerostomia.  He is gaining weight.  He continues physical therapy.  Counts continue to improve slowly.  Creatinine is 1.23.  Magnesium is 1.8.  Plan:  1.  Labs today:  CBC with diff, CMP, Mg. 2.  Cycle #2 daratumumab today. 3.  RTC in 1 month for MD assessment, labs (CBC with diff, CMP, Mg, SPEP, free light chains), and cycle #3 daratumumab.   Lequita Asal, MD  01/04/2017, 9:32 AM

## 2017-01-05 MED FILL — Medication: INTRATHECAL | Qty: 1 | Status: AC

## 2017-01-08 ENCOUNTER — Ambulatory Visit: Payer: Medicare Other | Attending: Hematology and Oncology | Admitting: Physical Therapy

## 2017-01-08 DIAGNOSIS — R293 Abnormal posture: Secondary | ICD-10-CM | POA: Diagnosis present

## 2017-01-08 DIAGNOSIS — M6281 Muscle weakness (generalized): Secondary | ICD-10-CM | POA: Diagnosis not present

## 2017-01-08 DIAGNOSIS — R262 Difficulty in walking, not elsewhere classified: Secondary | ICD-10-CM | POA: Diagnosis present

## 2017-01-08 DIAGNOSIS — R2689 Other abnormalities of gait and mobility: Secondary | ICD-10-CM | POA: Insufficient documentation

## 2017-01-08 NOTE — Therapy (Deleted)
[facility] [address] [place], [place], [address] Phone: [phone]   Fax:  [phone]  Physical Therapy Treatment  Patient Details  Name: Logan Perez MRN: [id number] Date of Birth: 01-12-1946 Referring Provider: Nolon Stalls, MD  Encounter Date: 01/08/2017      PT End of Session - 01/08/17 1056    Visit Number 22   Number of Visits 23   Date for PT Re-Evaluation 01/11/17   Authorization - Visit Number 51   Authorization - Number of Visits 24   PT Start Time 0934   PT Stop Time 1031   PT Time Calculation (min) 57 min   Equipment Utilized During Treatment Gait belt   Activity Tolerance Patient tolerated treatment well   Behavior During Therapy [facility] for tasks assessed/performed      Past Medical History:  Diagnosis Date  . Anxiety   . Atrial fibrillation ([place])   . BPH (benign prostatic hyperplasia)   . Complication of anesthesia    BAD HEADACHE NIGHT OF FIRST CATARACT  . Difficulty voiding   . Dysrhythmia    A FIB  . Elevated PSA   . GERD (gastroesophageal reflux disease)   . HLD (hyperlipidemia)   . HOH (hard of hearing)   . Hypertension   . Multiple myeloma ([place])   . Neuropathy ([place])   . Pain    BACK  . Palpitations   . Pneumonia   . Pulmonary embolism ([place])   . Stroke [facility])    TIA    Past Surgical History:  Procedure Laterality Date  . BACK SURGERY  1994  . CATARACT EXTRACTION W/PHACO Right 05/02/2016   Procedure: CATARACT EXTRACTION PHACO AND INTRAOCULAR LENS PLACEMENT (IOC);  Surgeon: Birder Robson, MD;  Location: [facility] ORS;  Service: Ophthalmology;  Laterality: Right;  [place] 1.06 AP% 20.6 CDE 13.70 FLUID PACK LOT # [id number] H  . CATARACT EXTRACTION W/PHACO Left 05/16/2016   Procedure: CATARACT EXTRACTION PHACO AND INTRAOCULAR LENS PLACEMENT (IOC);  Surgeon: Birder Robson, MD;  Location: [facility] ORS;  Service: Ophthalmology;  Laterality: Left;  [place] 01:43 AP% 19.8 CDE 20.45 FLUID PACK LOT  #[id number] H  . KNEE ARTHROSCOPY Left 1992  . LIMBAL STEM CELL TRANSPLANT    . PAIN PUMP IMPLANTATION  2012  . PORTA CATH INSERTION N/A 12/04/2016   Procedure: Glori Luis Cath Insertion;  Surgeon: Algernon Huxley, MD;  Location: [facility];  Service: Cardiovascular;  Laterality: N/A;  . stem cell implant  2008   [facility]    There were no vitals filed for this visit.      Subjective Assessment - 01/08/17 0945    Subjective Pt. had infusion on Thursday with [facility] and had some pain due to lack of numbing cream. Pt. has walked 20 minutes prior to session today with multiple stop breaks.    Pertinent History Pt. has multiple myeloma (dx 2008.) Pt. was unable to walk for a while afterward but did PT for ~6 mo. after. Pt states cancer went into remission, no meds 2008-2011, cancer returned 2011/2012. Hx of 4 compression fx. Took chemo drug for 5 years which was beneficial, not currently taking any chemo. Sept 2017 pt had 2nd stem cell transplant, recently pt has returned to going out in public. Before stem cell transplant, pt reports he could walk a mile, but now only walks to mailbox and back since he has lost his endurance. Pt progressed well with home health PT going up and down stairs. Reports 3/10  low back pain and takes an Aleve in the a.m.  Pt has a pain pump for spine for daily use.    Patient Stated Goals Pt. would like to return to a gym-based program to begin regaining general strength    Currently in Pain? No/denies    TherEx Nustep lvl 6 10 minutes Heel toe rocking 15x Single leg heel raise 10x each leg Marching in // bars with decreased UE assistance Side steps/squats in // bars 6x Large steps /lunges in // bars 2x Side steps over 3" steps in // bars 8x with decreasing UE support 5lb ankle weights: 20 marches, 10 hip abductions, 10x hip extension, 10x hip flexion. Hip extension most difficult for pt.to perform due to postural requirements of task.  Neuro Re-ed Airex balance beam 10x with  decreasing UE assistance -4x with no UE assistance Airex balance beam side steps with decreasing UE assistance.  Cross body marches 64 ft with difficulty due to coordination demands. Occasional LOB Marching with TherEx ball on table for dynamic surface   Pt response for medical necessity: Pt. Will continue to benefit from skilled PT in order to keep progressing LE strengthening,balance training, and capacity for functional activities of daily life.       PT Long Term Goals - 12/14/16 1000      PT LONG TERM GOAL #1   Title Pt. will score a 56/56 on the Berg Balance Assessment in order to improve functional mobility and balance.    Baseline Ongoing,  Pt scored a 54/56 10/19/15.  Pt scored a 55/56 on 2/13/`17., 3/8: 55/56   Time 4   Period Weeks   Status On-going     PT LONG TERM GOAL #2   Title Pt will demonstrate a normalized gait pattern with a wider base of support in order to improve overall mobility.    Baseline Patient is ambulating with a functional base of support, improving his confidence in ambulating.    Time 4   Period Weeks   Status Achieved     PT LONG TERM GOAL #3   Title Pt will ambulate for 10 minutes safely without fatigue in order to increase endurance while in the community.    Baseline Patient ambulates 10 minutes safely without fatigue.    Time 4   Period Weeks   Status Achieved     PT LONG TERM GOAL #4   Title Pt. will participate in a gym-based exercise program with no limitations to promote independence.    Baseline Patient unable to return to gym at this time due to fear of exposure while on medical tx due to low immune system.    Time 4   Period Weeks   Status On-going     PT LONG TERM GOAL #5   Title Pt will ambulate for 20 minutes safely without fatigue in order to increase endurance while in the community and walking dog.    Baseline 10 minutes on treadmill level 2.2-3.2    Time 4   Period Weeks   Status On-going     Additional Long Term  Goals   Additional Long Term Goals Yes     PT LONG TERM GOAL #6   Title Patient will squat to pick up object from floor and return to standing 10x without LOB to increase safety with activities of daily living.   Baseline squat to pick up cone off ground 10x no LOB   Time 4   Period Weeks   Status Achieved  PT LONG TERM GOAL #7   Title Patient will complete 10 step ups onto 6" step without UE assist or LOB to increase mobility in the community.    Baseline Step up 10x on 6" step without UE assistance on left (difficulty on right)   Time 4   Period Weeks   Status Achieved     PT LONG TERM GOAL #8   Title Patient will perform single limb stance for 15 seconds on RLE.    Baseline 3/8: 6 seconds    Time 4   Period Weeks   Status New     PT LONG TERM GOAL  #9   TITLE Patient will perform tandem stance for 15 seconds without LOB with one foot in front of the other touching.    Baseline Patient unable to have one foot in front of the other touching, only with some space between.    Time 4   Period Weeks   Status New               Plan - 01/08/17 1246    Clinical Impression Statement Patient has increased ambulation duration to 20 minute walks with dog, however does require frequent breaks. Functional strength and balance continues to be challenged with combination. Pt. improved ability to ambulate across dynamic balance beam Airex with decreased episodes of LOB and better stability. Cross body tasks are challenging to pt. and require additional focus and cues for task orientation. LE strengthening activities continue to progress with positive challenge to pt. RLE  is weaker than LLE with noted decreased ability to perform single  limb task challenges on RLE.  Patient will continue to benefit from skilled physical therapy to improve safety, generalized mobility, and quality of life.     Rehab Potential Good   Clinical Impairments Affecting Rehab Potential motivation, active  lifestyle, family support.    PT Frequency 2x / week   PT Duration 4 weeks   PT Treatment/Interventions Gait training;Functional mobility training;Therapeutic exercise;Balance training;Neuromuscular re-education   PT Next Visit Plan RECERT   PT Home Exercise Plan See handouts   Consulted and Agree with Plan of Care Patient      Patient will benefit from skilled therapeutic intervention in order to improve the following deficits and impairments:  Abnormal gait, Postural dysfunction, Decreased activity tolerance, Decreased endurance, Decreased strength, Decreased balance, Difficulty walking  Visit Diagnosis: Muscle weakness (generalized)  Difficulty in walking, not elsewhere classified  Imbalance  Abnormal posture     Problem List Patient Active Problem List   Diagnosis Date Noted  . Immunocompromised state associated with stem cell transplant ([place]) 09/26/2016  . Multiple myeloma in remission ([place]) 09/19/2016  . Xerostomia 08/23/2016  . Moderate protein-calorie malnutrition ([place]) 08/20/2016  . Thrombocytopenia ([place]) 08/20/2016  . Anemia 08/20/2016  . Nausea without vomiting 08/20/2016  . Atrial fibrillation ([place]) 07/10/2016  . 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 H/O autologous stem cell transplant ([place]) 07/10/2016  . Bone cancer ([place]) 06/15/2016  . Porokeratosis 04/06/2016  . Encounter for adjustment or management of infusion pump 03/23/2016  . Hypomagnesemia 12/01/2015  . Ejection fraction < 50% 11/30/2015  . Presence of intrathecal pump 08/26/2015  . Long term current use of opiate analgesic 08/26/2015  . Long term prescription opiate use 08/26/2015  . Opiate use 08/26/2015  . Encounter for therapeutic drug level monitoring 08/26/2015  . Night muscle spasms 08/26/2015  . Muscle spasticity 08/26/2015  . Neuropathic pain 08/26/2015  . Neurogenic  pain 08/26/2015  . Chronic low back pain 08/26/2015  . Lumbar spondylosis  08/26/2015  . Compression fracture of T12 vertebra (HCC) (70-75% magnitude) (with mild retropulsion) 08/26/2015  . Diffuse myofascial pain syndrome 08/26/2015  . Presence of implanted infusion pump (Medtronic, programmable, intrathecal pump) 08/26/2015  . Cancer associated pain 08/26/2015  . Encounter for interrogation of infusion pump 08/26/2015  . Chronic lumbar radicular pain 08/26/2015  . Opiate analgesic contract exists 08/26/2015  . Chronic pain syndrome 08/26/2015  . Chronic anticoagulation (Eliquis) 08/26/2015  . Clostridium difficile diarrhea 07/21/2015  . Diarrhea 07/19/2015  . Multiple myeloma ([place]) 05/04/2015  . BPH with obstruction/lower urinary tract symptoms 04/29/2015  . MI (mitral incompetence) 12/03/2014  . TI (tricuspid incompetence) 12/03/2014  . Paroxysmal atrial fibrillation ([place]) 12/03/2014  . Breathlessness on exertion 12/03/2014  . Essential (primary) hypertension 01/29/2014  . Acid reflux 01/29/2014  . Combined fat and carbohydrate induced hyperlipemia 01/29/2014  . Pulmonary embolism ([place]) 01/29/2014    Janna Arch 01/08/2017, 12:51 PM  [facility] [address] [place], [place], [address] Phone: [phone]   Fax:  [phone]  Name: Logan Perez MRN: [id number] Date of Birth: 05/07/1946

## 2017-01-08 NOTE — Therapy (Deleted)
[facility] [address]. [place], [place], [address] Phone: [phone]   Fax:  [phone]  Physical Therapy Treatment  Patient Details  Name: Logan Perez MRN: [id number] Date of Birth: 1945-12-29 Referring Provider: Nolon Stalls, MD  Encounter Date: 01/08/2017    Past Medical History:  Diagnosis Date  . Anxiety   . Atrial fibrillation ([place])   . BPH (benign prostatic hyperplasia)   . Complication of anesthesia    BAD HEADACHE NIGHT OF FIRST CATARACT  . Difficulty voiding   . Dysrhythmia    A FIB  . Elevated PSA   . GERD (gastroesophageal reflux disease)   . HLD (hyperlipidemia)   . HOH (hard of hearing)   . Hypertension   . Multiple myeloma ([place])   . Neuropathy ([place])   . Pain    BACK  . Palpitations   . Pneumonia   . Pulmonary embolism ([place])   . Stroke [facility])    TIA    Past Surgical History:  Procedure Laterality Date  . BACK SURGERY  1994  . CATARACT EXTRACTION W/PHACO Right 05/02/2016   Procedure: CATARACT EXTRACTION PHACO AND INTRAOCULAR LENS PLACEMENT (IOC);  Surgeon: Birder Robson, MD;  Location: [facility]S;  Service: Ophthalmology;  Laterality: Right;  [place] 1.06 AP% 20.6 CDE 13.70 FLUID PACK LOT # [id number] H  . CATARACT EXTRACTION W/PHACO Left 05/16/2016   Procedure: CATARACT EXTRACTION PHACO AND INTRAOCULAR LENS PLACEMENT (IOC);  Surgeon: Birder Robson, MD;  Location: [facility] ORS;  Service: Ophthalmology;  Laterality: Left;  [place] 01:43 AP% 19.8 CDE 20.45 FLUID PACK LOT #[id number] H  . KNEE ARTHROSCOPY Left 1992  . LIMBAL STEM CELL TRANSPLANT    . PAIN PUMP IMPLANTATION  2012  . PORTA CATH INSERTION N/A 12/04/2016   Procedure: Glori Luis Cath Insertion;  Surgeon: Algernon Huxley, MD;  Location: [facility];  Service: Cardiovascular;  Laterality: N/A;  . stem cell implant  2008   [facility]    There were no vitals filed for this visit.      Subjective Assessment - 01/08/17 0945    Subjective Pt. had  infusion on Thursday with [facility] and had some pain due to lack of numbing cream. Pt. has walked 20 minutes prior to session today with multiple stop breaks.    Pertinent History Pt. has multiple myeloma (dx 2008.) Pt. was unable to walk for a while afterward but did PT for ~6 mo. after. Pt states cancer went into remission, no meds 2008-2011, cancer returned 2011/2012. Hx of 4 compression fx. Took chemo drug for 5 years which was beneficial, not currently taking any chemo. Sept 2017 pt had 2nd stem cell transplant, recently pt has returned to going out in public. Before stem cell transplant, pt reports he could walk a mile, but now only walks to mailbox and back since he has lost his endurance. Pt progressed well with home health PT going up and down stairs. Reports 3/10 low back pain and takes an Aleve in the a.m.  Pt has a pain pump for spine for daily use.    Patient Stated Goals Pt. would like to return to a gym-based program to begin regaining general strength    Currently in Pain? No/denies    TherEx Nustep lvl 6 10 minutes Heel toe rocking 15x Single leg heel raise 10x each leg Marching in // bars with decreased UE assistance Side steps/squats in // bars 6x Large steps /lunges in // bars 2x Airex balance beam 10x  with decreasing UE assistance -4x with no UE assistance Cross body marches 64 ft x w Marching with TherEx ball on table for dynamic surface Side steps over 3" steps in // bars 8x with decreasing UE support                                PT Long Term Goals - 12/14/16 1000      PT LONG TERM GOAL #1   Title Pt. will score a 56/56 on the Berg Balance Assessment in order to improve functional mobility and balance.    Baseline Ongoing,  Pt scored a 54/56 10/19/15.  Pt scored a 55/56 on 2/13/`17., 3/8: 55/56   Time 4   Period Weeks   Status On-going     PT LONG TERM GOAL #2   Title Pt will demonstrate a normalized gait pattern with a wider base of support  in order to improve overall mobility.    Baseline Patient is ambulating with a functional base of support, improving his confidence in ambulating.    Time 4   Period Weeks   Status Achieved     PT LONG TERM GOAL #3   Title Pt will ambulate for 10 minutes safely without fatigue in order to increase endurance while in the community.    Baseline Patient ambulates 10 minutes safely without fatigue.    Time 4   Period Weeks   Status Achieved     PT LONG TERM GOAL #4   Title Pt. will participate in a gym-based exercise program with no limitations to promote independence.    Baseline Patient unable to return to gym at this time due to fear of exposure while on medical tx due to low immune system.    Time 4   Period Weeks   Status On-going     PT LONG TERM GOAL #5   Title Pt will ambulate for 20 minutes safely without fatigue in order to increase endurance while in the community and walking dog.    Baseline 10 minutes on treadmill level 2.2-3.2    Time 4   Period Weeks   Status On-going     Additional Long Term Goals   Additional Long Term Goals Yes     PT LONG TERM GOAL #6   Title Patient will squat to pick up object from floor and return to standing 10x without LOB to increase safety with activities of daily living.   Baseline squat to pick up cone off ground 10x no LOB   Time 4   Period Weeks   Status Achieved     PT LONG TERM GOAL #7   Title Patient will complete 10 step ups onto 6" step without UE assist or LOB to increase mobility in the community.    Baseline Step up 10x on 6" step without UE assistance on left (difficulty on right)   Time 4   Period Weeks   Status Achieved     PT LONG TERM GOAL #8   Title Patient will perform single limb stance for 15 seconds on RLE.    Baseline 3/8: 6 seconds    Time 4   Period Weeks   Status New     PT LONG TERM GOAL  #9   TITLE Patient will perform tandem stance for 15 seconds without LOB with one foot in front of the other  touching.    Baseline Patient unable to have  one foot in front of the other touching, only with some space between.    Time 4   Period Weeks   Status New             Patient will benefit from skilled therapeutic intervention in order to improve the following deficits and impairments:     Visit Diagnosis: Muscle weakness (generalized)  Difficulty in walking, not elsewhere classified  Imbalance  Abnormal posture     Problem List Patient Active Problem List   Diagnosis Date Noted  . Immunocompromised state associated with stem cell transplant ([place]) 09/26/2016  . Multiple myeloma in remission ([place]) 09/19/2016  . Xerostomia 08/23/2016  . Moderate protein-calorie malnutrition ([place]) 08/20/2016  . Thrombocytopenia ([place]) 08/20/2016  . Anemia 08/20/2016  . Nausea without vomiting 08/20/2016  . Atrial fibrillation ([place]) 07/10/2016  . Engraftment syndrome following stem cell transplantation ([place]) 07/10/2016  . H/O autologous stem cell transplant ([place]) 07/10/2016  . Bone cancer ([place]) 06/15/2016  . Porokeratosis 04/06/2016  . Encounter for adjustment or management of infusion pump 03/23/2016  . Hypomagnesemia 12/01/2015  . Ejection fraction < 50% 11/30/2015  . Presence of intrathecal pump 08/26/2015  . Long term current use of opiate analgesic 08/26/2015  . Long term prescription opiate use 08/26/2015  . Opiate use 08/26/2015  . Encounter for therapeutic drug level monitoring 08/26/2015  . Night muscle spasms 08/26/2015  . Muscle spasticity 08/26/2015  . Neuropathic pain 08/26/2015  . Neurogenic pain 08/26/2015  . Chronic low back pain 08/26/2015  . Lumbar spondylosis 08/26/2015  . Compression fracture of T12 vertebra (HCC) (70-75% magnitude) (with mild retropulsion) 08/26/2015  . Diffuse myofascial pain syndrome 08/26/2015  . Presence of implanted infusion pump (Medtronic, programmable, intrathecal pump) 08/26/2015  . Cancer associated pain 08/26/2015  . Encounter for  interrogation of infusion pump 08/26/2015  . Chronic lumbar radicular pain 08/26/2015  . Opiate analgesic contract exists 08/26/2015  . Chronic pain syndrome 08/26/2015  . Chronic anticoagulation (Eliquis) 08/26/2015  . Clostridium difficile diarrhea 07/21/2015  . Diarrhea 07/19/2015  . Multiple myeloma ([place]) 05/04/2015  . BPH with obstruction/lower urinary tract symptoms 04/29/2015  . MI (mitral incompetence) 12/03/2014  . TI (tricuspid incompetence) 12/03/2014  . Paroxysmal atrial fibrillation ([place]) 12/03/2014  . Breathlessness on exertion 12/03/2014  . Essential (primary) hypertension 01/29/2014  . Acid reflux 01/29/2014  . Combined fat and carbohydrate induced hyperlipemia 01/29/2014  . Pulmonary embolism ([place]) 01/29/2014    Janna Arch 01/08/2017, 9:47 AM  [facility] [address]. [place], [place], [address] Phone: [phone]   Fax:  [phone]  Name: Logan Perez MRN: [id number] Date of Birth: April 30, 1946

## 2017-01-08 NOTE — Therapy (Cosign Needed)
[facility] [address] [place], [place], [address] Phone: [phone]   Fax:  [phone]  Physical Therapy Treatment  Patient Details  Name: Logan Perez MRN: [id number] Date of Birth: 01-12-1946 Referring Provider: Nolon Stalls, MD  Encounter Date: 01/08/2017      PT End of Session - 01/08/17 1056    Visit Number 22   Number of Visits 23   Date for PT Re-Evaluation 01/11/17   Authorization - Visit Number 51   Authorization - Number of Visits 24   PT Start Time 0934   PT Stop Time 1031   PT Time Calculation (min) 57 min   Equipment Utilized During Treatment Gait belt   Activity Tolerance Patient tolerated treatment well   Behavior During Therapy [facility] for tasks assessed/performed      Past Medical History:  Diagnosis Date  . Anxiety   . Atrial fibrillation ([place])   . BPH (benign prostatic hyperplasia)   . Complication of anesthesia    BAD HEADACHE NIGHT OF FIRST CATARACT  . Difficulty voiding   . Dysrhythmia    A FIB  . Elevated PSA   . GERD (gastroesophageal reflux disease)   . HLD (hyperlipidemia)   . HOH (hard of hearing)   . Hypertension   . Multiple myeloma ([place])   . Neuropathy ([place])   . Pain    BACK  . Palpitations   . Pneumonia   . Pulmonary embolism ([place])   . Stroke [facility])    TIA    Past Surgical History:  Procedure Laterality Date  . BACK SURGERY  1994  . CATARACT EXTRACTION W/PHACO Right 05/02/2016   Procedure: CATARACT EXTRACTION PHACO AND INTRAOCULAR LENS PLACEMENT (IOC);  Surgeon: Birder Robson, MD;  Location: [facility] ORS;  Service: Ophthalmology;  Laterality: Right;  [place] 1.06 AP% 20.6 CDE 13.70 FLUID PACK LOT # [id number] H  . CATARACT EXTRACTION W/PHACO Left 05/16/2016   Procedure: CATARACT EXTRACTION PHACO AND INTRAOCULAR LENS PLACEMENT (IOC);  Surgeon: Birder Robson, MD;  Location: [facility] ORS;  Service: Ophthalmology;  Laterality: Left;  [place] 01:43 AP% 19.8 CDE 20.45 FLUID PACK LOT  #[id number] H  . KNEE ARTHROSCOPY Left 1992  . LIMBAL STEM CELL TRANSPLANT    . PAIN PUMP IMPLANTATION  2012  . PORTA CATH INSERTION N/A 12/04/2016   Procedure: Glori Luis Cath Insertion;  Surgeon: Algernon Huxley, MD;  Location: [facility];  Service: Cardiovascular;  Laterality: N/A;  . stem cell implant  2008   [facility]    There were no vitals filed for this visit.      Subjective Assessment - 01/08/17 0945    Subjective Pt. had infusion on Thursday with [place] and had some pain due to lack of numbing cream. Pt. has walked 20 minutes prior to session today with multiple stop breaks.    Pertinent History Pt. has multiple myeloma (dx 2008.) Pt. was unable to walk for a while afterward but did PT for ~6 mo. after. Pt states cancer went into remission, no meds 2008-2011, cancer returned 2011/2012. Hx of 4 compression fx. Took chemo drug for 5 years which was beneficial, not currently taking any chemo. Sept 2017 pt had 2nd stem cell transplant, recently pt has returned to going out in public. Before stem cell transplant, pt reports he could walk a mile, but now only walks to mailbox and back since he has lost his endurance. Pt progressed well with home health PT going up and down stairs. Reports 3/10  low back pain and takes an Aleve in the a.m.  Pt has a pain pump for spine for daily use.    Patient Stated Goals Pt. would like to return to a gym-based program to begin regaining general strength    Currently in Pain? No/denies    TherEx Nustep lvl 6 10 minutes Heel toe rocking 15x Single leg heel raise 10x each leg Marching in // bars with decreased UE assistance Side steps/squats in // bars 6x Large steps /lunges in // bars 2x Side steps over 3" steps in // bars 8x with decreasing UE support 5lb ankle weights: 20 marches, 10 hip abductions, 10x hip extension, 10x hip flexion. Hip extension most difficult for pt.to perform due to postural requirements of task.  Neuro Re-ed Airex balance beam 10x with  decreasing UE assistance -4x with no UE assistance Airex balance beam side steps with decreasing UE assistance.  Cross body marches 64 ft with difficulty due to coordination demands. Occasional LOB Marching with TherEx ball on table for dynamic surface   Pt response for medical necessity: Pt. Will continue to benefit from skilled PT in order to keep progressing LE strengthening,balance training, and capacity for functional activities of daily life.       PT Long Term Goals - 12/14/16 1000      PT LONG TERM GOAL #1   Title Pt. will score a 56/56 on the Berg Balance Assessment in order to improve functional mobility and balance.    Baseline Ongoing,  Pt scored a 54/56 10/19/15.  Pt scored a 55/56 on 2/13/`17., 3/8: 55/56   Time 4   Period Weeks   Status On-going     PT LONG TERM GOAL #2   Title Pt will demonstrate a normalized gait pattern with a wider base of support in order to improve overall mobility.    Baseline Patient is ambulating with a functional base of support, improving his confidence in ambulating.    Time 4   Period Weeks   Status Achieved     PT LONG TERM GOAL #3   Title Pt will ambulate for 10 minutes safely without fatigue in order to increase endurance while in the community.    Baseline Patient ambulates 10 minutes safely without fatigue.    Time 4   Period Weeks   Status Achieved     PT LONG TERM GOAL #4   Title Pt. will participate in a gym-based exercise program with no limitations to promote independence.    Baseline Patient unable to return to gym at this time due to fear of exposure while on medical tx due to low immune system.    Time 4   Period Weeks   Status On-going     PT LONG TERM GOAL #5   Title Pt will ambulate for 20 minutes safely without fatigue in order to increase endurance while in the community and walking dog.    Baseline 10 minutes on treadmill level 2.2-3.2    Time 4   Period Weeks   Status On-going     Additional Long Term  Goals   Additional Long Term Goals Yes     PT LONG TERM GOAL #6   Title Patient will squat to pick up object from floor and return to standing 10x without LOB to increase safety with activities of daily living.   Baseline squat to pick up cone off ground 10x no LOB   Time 4   Period Weeks   Status Achieved  PT LONG TERM GOAL #7   Title Patient will complete 10 step ups onto 6" step without UE assist or LOB to increase mobility in the community.    Baseline Step up 10x on 6" step without UE assistance on left (difficulty on right)   Time 4   Period Weeks   Status Achieved     PT LONG TERM GOAL #8   Title Patient will perform single limb stance for 15 seconds on RLE.    Baseline 3/8: 6 seconds    Time 4   Period Weeks   Status New     PT LONG TERM GOAL  #9   TITLE Patient will perform tandem stance for 15 seconds without LOB with one foot in front of the other touching.    Baseline Patient unable to have one foot in front of the other touching, only with some space between.    Time 4   Period Weeks   Status New               Plan - 01/08/17 1246    Clinical Impression Statement Patient has increased ambulation duration to 20 minute walks with dog, however does require frequent breaks. Functional strength and balance continues to be challenged with combination. Pt. improved ability to ambulate across dynamic balance beam Airex with decreased episodes of LOB and better stability. Cross body tasks are challenging to pt. and require additional focus and cues for task orientation. LE strengthening activities continue to progress with positive challenge to pt. RLE  is weaker than LLE with noted decreased ability to perform single  limb task challenges on RLE.  Patient will continue to benefit from skilled physical therapy to improve safety, generalized mobility, and quality of life.     Rehab Potential Good   Clinical Impairments Affecting Rehab Potential motivation, active  lifestyle, family support.    PT Frequency 2x / week   PT Duration 4 weeks   PT Treatment/Interventions Gait training;Functional mobility training;Therapeutic exercise;Balance training;Neuromuscular re-education   PT Next Visit Plan RECERT   PT Home Exercise Plan See handouts   Consulted and Agree with Plan of Care Patient      Patient will benefit from skilled therapeutic intervention in order to improve the following deficits and impairments:  Abnormal gait, Postural dysfunction, Decreased activity tolerance, Decreased endurance, Decreased strength, Decreased balance, Difficulty walking  Visit Diagnosis: Muscle weakness (generalized)  Difficulty in walking, not elsewhere classified  Imbalance  Abnormal posture     Problem List Patient Active Problem List   Diagnosis Date Noted  . Immunocompromised state associated with stem cell transplant ([place]) 09/26/2016  . Multiple myeloma in remission ([place]) 09/19/2016  . Xerostomia 08/23/2016  . Moderate protein-calorie malnutrition ([place]) 08/20/2016  . Thrombocytopenia ([place]) 08/20/2016  . Anemia 08/20/2016  . Nausea without vomiting 08/20/2016  . Atrial fibrillation ([place]) 07/10/2016  . 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 H/O autologous stem cell transplant ([place]) 07/10/2016  . Bone cancer ([place]) 06/15/2016  . Porokeratosis 04/06/2016  . Encounter for adjustment or management of infusion pump 03/23/2016  . Hypomagnesemia 12/01/2015  . Ejection fraction < 50% 11/30/2015  . Presence of intrathecal pump 08/26/2015  . Long term current use of opiate analgesic 08/26/2015  . Long term prescription opiate use 08/26/2015  . Opiate use 08/26/2015  . Encounter for therapeutic drug level monitoring 08/26/2015  . Night muscle spasms 08/26/2015  . Muscle spasticity 08/26/2015  . Neuropathic pain 08/26/2015  . Neurogenic  pain 08/26/2015  . Chronic low back pain 08/26/2015  . Lumbar spondylosis  08/26/2015  . Compression fracture of T12 vertebra (HCC) (70-75% magnitude) (with mild retropulsion) 08/26/2015  . Diffuse myofascial pain syndrome 08/26/2015  . Presence of implanted infusion pump (Medtronic, programmable, intrathecal pump) 08/26/2015  . Cancer associated pain 08/26/2015  . Encounter for interrogation of infusion pump 08/26/2015  . Chronic lumbar radicular pain 08/26/2015  . Opiate analgesic contract exists 08/26/2015  . Chronic pain syndrome 08/26/2015  . Chronic anticoagulation (Eliquis) 08/26/2015  . Clostridium difficile diarrhea 07/21/2015  . Diarrhea 07/19/2015  . Multiple myeloma ([place]) 05/04/2015  . BPH with obstruction/lower urinary tract symptoms 04/29/2015  . MI (mitral incompetence) 12/03/2014  . TI (tricuspid incompetence) 12/03/2014  . Paroxysmal atrial fibrillation ([place]) 12/03/2014  . Breathlessness on exertion 12/03/2014  . Essential (primary) hypertension 01/29/2014  . Acid reflux 01/29/2014  . Combined fat and carbohydrate induced hyperlipemia 01/29/2014  . Pulmonary embolism ([place]) 01/29/2014    Janna Arch, SPT  This entire session was performed under direct supervision and direction of a licensed therapist/therapist assistant . I have personally read, edited and approve of the note as written.  Collie Siad PT, DPT  01/09/2017, 11:11 AM  [facility] [address] [place], [place], [address] Phone: [phone]   Fax:  [phone]  Name: Logan Perez MRN: [id number] Date of Birth: [birth year]

## 2017-01-09 ENCOUNTER — Encounter: Payer: Self-pay | Admitting: Physical Therapy

## 2017-01-11 ENCOUNTER — Ambulatory Visit: Payer: Medicare Other | Admitting: Physical Therapy

## 2017-01-11 ENCOUNTER — Encounter: Payer: Self-pay | Admitting: Physical Therapy

## 2017-01-11 DIAGNOSIS — R2689 Other abnormalities of gait and mobility: Secondary | ICD-10-CM

## 2017-01-11 DIAGNOSIS — R293 Abnormal posture: Secondary | ICD-10-CM

## 2017-01-11 DIAGNOSIS — M6281 Muscle weakness (generalized): Secondary | ICD-10-CM

## 2017-01-11 DIAGNOSIS — R262 Difficulty in walking, not elsewhere classified: Secondary | ICD-10-CM

## 2017-01-11 NOTE — Therapy (Cosign Needed)
[facility] [address]. [place], [place], [address] Phone: [phone]   Fax:  [phone]  Physical Therapy Treatment  Patient Details  Name: Logan Perez MRN: [id number] Date of Birth: [birth year] Referring Provider: Nolon Stalls, MD  Encounter Date: 01/11/2017      PT End of Session - 01/11/17 1110    Visit Number 23   Number of Visits 27   Date for PT Re-Evaluation 02/08/17   Authorization - Visit Number 23   Authorization - Number of Visits 28   PT Start Time [phone]   PT Stop Time 1032   PT Time Calculation (min) 50 min   Equipment Utilized During Treatment Gait belt   Activity Tolerance Patient tolerated treatment well   Behavior During Therapy [facility] for tasks assessed/performed      Past Medical History:  Diagnosis Date  . Anxiety   . Atrial fibrillation ([place])   . BPH (benign prostatic hyperplasia)   . Complication of anesthesia    BAD HEADACHE NIGHT OF FIRST CATARACT  . Difficulty voiding   . Dysrhythmia    A FIB  . Elevated PSA   . GERD (gastroesophageal reflux disease)   . HLD (hyperlipidemia)   . HOH (hard of hearing)   . Hypertension   . Multiple myeloma ([place])   . Neuropathy ([place])   . Pain    BACK  . Palpitations   . Pneumonia   . Pulmonary embolism ([place])   . Stroke [facility])    TIA    Past Surgical History:  Procedure Laterality Date  . BACK SURGERY  1994  . CATARACT EXTRACTION W/PHACO Right 05/02/2016   Procedure: CATARACT EXTRACTION PHACO AND INTRAOCULAR LENS PLACEMENT (IOC);  Surgeon: Birder Robson, MD;  Location: [facility]S;  Service: Ophthalmology;  Laterality: Right;  [place] 1.06 AP% 20.6 CDE 13.70 FLUID PACK LOT # [id number] H  . CATARACT EXTRACTION W/PHACO Left 05/16/2016   Procedure: CATARACT EXTRACTION PHACO AND INTRAOCULAR LENS PLACEMENT (IOC);  Surgeon: Birder Robson, MD;  Location: [facility]S;  Service: Ophthalmology;  Laterality: Left;  [place] 01:43 AP% 19.8 CDE 20.45 FLUID PACK LOT  #[id number] H  . KNEE ARTHROSCOPY Left 1992  . LIMBAL STEM CELL TRANSPLANT    . PAIN PUMP IMPLANTATION  2012  . PORTA CATH INSERTION N/A 12/04/2016   Procedure: Glori Luis Cath Insertion;  Surgeon: Algernon Huxley, MD;  Location: [place] [facility];  Service: Cardiovascular;  Laterality: N/A;  . stem cell implant  2008   [facility]    There were no vitals filed for this visit.      Subjective Assessment - 01/11/17 1109    Subjective Patient has been continuing to increase his walking. He has been working on his house recently.    Pertinent History Pt. has multiple myeloma (dx 2008.) Pt. was unable to walk for a while afterward but did PT for ~6 mo. after. Pt states cancer went into remission, no meds 2008-2011, cancer returned 2011/2012. Hx of 4 compression fx. Took chemo drug for 5 years which was beneficial, not currently taking any chemo. Sept 2017 pt had 2nd stem cell transplant, recently pt has returned to going out in public. Before stem cell transplant, pt reports he could walk a mile, but now only walks to mailbox and back since he has lost his endurance. Pt progressed well with home health PT going up and down stairs. Reports 3/10 low back pain and takes an Aleve in the a.m.  Pt has a  pain pump for spine for daily use.    Patient Stated Goals Pt. would like to return to a gym-based program to begin regaining general strength    Currently in Pain? No/denies     Neuro: Airex pad: Tandem stance 2x45 seconds each Airex pad: eyes closed feet shoulder width apart Airex pad: Arm marches 1x30 seconds, eyes closed arm marches 30 seconds Pertubations in standing with eyes closed. No LOB Airex pad: perturbations applied with eyes closed, good control of midline.  SLS: R: 8 seconds, L 15 seconds Tandem stance: L foot forward 16 seconds, R foot forward 30 seconds Cross body marches 64 ft with difficulty due to coordination demands. Occasional LOB  TherEx Treadmill 10 minutes 2.2-2.5 with occasional scuffing  of shoes due to fatigue. Ball Rotation exercise with weighted ball . 12x each direction in a variety of planes. Educated on how to utilize feet pivoting motion to avoid twisting back Standing marches at // bars: BUE-> SUE-> no UE assistance, cues for upright posture and speed High knee marching 2x64 ft  Pt response for medical necessity: Pt. Will decrease frequency to 1x/week, will be off one week and assess functional deficits remaining.  Pt. Will continue to benefit from skilled PT in order to keep progressing LE strengthening,balance training, and capacity for functional activities of daily life       PT Education - 01/11/17 1110    Education provided Yes   Education Details turning without twisting back   Person(s) Educated Patient   Methods Explanation;Demonstration   Comprehension Verbalized understanding;Returned demonstration             PT Long Term Goals - 01/11/17 1112      PT LONG TERM GOAL #1   Title Pt. will score a 56/56 on the Berg Balance Assessment in order to improve functional mobility and balance.    Baseline 4/5: unable to peform single limb stance for the needed time for score of 56/56.,  Pt scored a 54/56 10/19/15.  Pt scored a 55/56 on 2/13/`17., 3/8: 55/56   Time 4   Period Weeks   Status On-going     PT LONG TERM GOAL #2   Title Pt will demonstrate a normalized gait pattern with a wider base of support in order to improve overall mobility.    Baseline Patient is ambulating with a functional base of support, improving his confidence in ambulating.    Time 4   Period Weeks   Status Achieved     PT LONG TERM GOAL #3   Title Pt will ambulate for 10 minutes safely without fatigue in order to increase endurance while in the community.    Baseline Patient ambulates 10 minutes safely without fatigue.    Time 4   Period Weeks   Status Achieved     PT LONG TERM GOAL #4   Title Pt. will participate in a gym-based exercise program with no limitations to  promote independence.    Baseline Patient shows interest in returning to gym, will check with wife this week   Time 4   Period Weeks   Status Partially Met     PT LONG TERM GOAL #5   Title Pt will ambulate for 20 minutes safely without fatigue in order to increase endurance while in the community and walking dog.    Baseline 10 minutes on treadmill level 2.0-2.5 for .8 miles with fatigue and occasional scuffing   Time 4   Period Weeks   Status On-going  Additional Long Term Goals   Additional Long Term Goals Yes     PT LONG TERM GOAL #6   Title Patient will squat to pick up object from floor and return to standing 10x without LOB to increase safety with activities of daily living.   Baseline squat to pick up cone off ground 10x no LOB   Time 4   Period Weeks   Status Achieved     PT LONG TERM GOAL #7   Title Patient will complete 10 step ups onto 6" step without UE assist or LOB to increase mobility in the community.    Baseline Step up 10x on 6" step without UE assistance on left (difficulty on right)   Time 4   Period Weeks   Status Achieved     PT LONG TERM GOAL #8   Title Patient will perform single limb stance for 15 seconds on RLE.    Baseline 4/5: 8 seconds RLE, 15 seconds LLE.    Time 4   Period Weeks   Status Partially Met     PT LONG TERM GOAL  #9   TITLE Patient will perform tandem stance for 15 seconds without LOB with one foot in front of the other touching.    Baseline Right leg in front: 30 seconds, Left leg in front 16 seconds   Time 4   Period Weeks   Status Achieved     PT LONG TERM GOAL  #10   TITLE Patient independent with HEP to progress to independent home based program.    Baseline Pt. working towards independent program   Time 4   Period Weeks   Status New               Plan - 2017/01/26 1129    Clinical Impression Statement Patient presents to physical therapy session with improved mobility in the community and at home. Dynamic  balance activities were progressed and pt. demonstrated good balance when challenged with eyes closed on Airex pad with very few trunk sways. Ambulation gait mechanics are normal with decreased capacity for duration. Pt limited to approx. 10 minutes without stopping, but is able to compete longer with breaks. Single limb balance continues to challenge pt. but is progressing as pt.'s strength progresses. At this time patient will be decreased to 1x/week with potential d/c sooner. Pt. will have this following week off from therapy to assess functional deficits at home due to rapid progress in clinic. Patient will benefit from continued skilled therapy 1x/week for balance and capacity for functional activities to improve ability to perform ADLs and return to prior level of function.    Rehab Potential Good   Clinical Impairments Affecting Rehab Potential motivation, active lifestyle, family support.    PT Frequency 1x / week   PT Duration 4 weeks   PT Treatment/Interventions Gait training;Functional mobility training;Therapeutic exercise;Balance training;Neuromuscular re-education   PT Next Visit Plan 1 week off, call in 1 week   PT Home Exercise Plan See handouts   Consulted and Agree with Plan of Care Patient      Patient will benefit from skilled therapeutic intervention in order to improve the following deficits and impairments:  Abnormal gait, Postural dysfunction, Decreased activity tolerance, Decreased endurance, Decreased strength, Decreased balance, Difficulty walking  Visit Diagnosis: Muscle weakness (generalized)  Difficulty in walking, not elsewhere classified  Imbalance  Abnormal posture       G-Codes - 26-Jan-2017 1425    Functional Assessment Tool Used (Outpatient Only)  Ambulatory endurance, clinical judgement, balance assessment   Functional Limitation Mobility: Walking and moving around   Mobility: Walking and Moving Around Current Status [phone]) At least 1 percent but less  than 20 percent impaired, limited or restricted   Mobility: Walking and Moving Around Goal Status [phone]) 0 percent impaired, limited or restricted      Problem List Patient Active Problem List   Diagnosis Date Noted  . Immunocompromised state associated with stem cell transplant ([place]) 09/26/2016  . Multiple myeloma in remission ([place]) 09/19/2016  . Xerostomia 08/23/2016  . Moderate protein-calorie malnutrition ([place]) 08/20/2016  . Thrombocytopenia ([place]) 08/20/2016  . Anemia 08/20/2016  . Nausea without vomiting 08/20/2016  . Atrial fibrillation ([place]) 07/10/2016  . Engraftment syndrome following stem cell transplantation ([place]) 07/10/2016  . H/O autologous stem cell transplant ([place]) 07/10/2016  . Bone cancer ([place]) 06/15/2016  . Porokeratosis 04/06/2016  . Encounter for adjustment or management of infusion pump 03/23/2016  . Hypomagnesemia 12/01/2015  . Ejection fraction < 50% 11/30/2015  . Presence of intrathecal pump 08/26/2015  . Long term current use of opiate analgesic 08/26/2015  . Long term prescription opiate use 08/26/2015  . Opiate use 08/26/2015  . Encounter for therapeutic drug level monitoring 08/26/2015  . Night muscle spasms 08/26/2015  . Muscle spasticity 08/26/2015  . Neuropathic pain 08/26/2015  . Neurogenic pain 08/26/2015  . Chronic low back pain 08/26/2015  . Lumbar spondylosis 08/26/2015  . Compression fracture of T12 vertebra (HCC) (70-75% magnitude) (with mild retropulsion) 08/26/2015  . Diffuse myofascial pain syndrome 08/26/2015  . Presence of implanted infusion pump (Medtronic, programmable, intrathecal pump) 08/26/2015  . Cancer associated pain 08/26/2015  . Encounter for interrogation of infusion pump 08/26/2015  . Chronic lumbar radicular pain 08/26/2015  . Opiate analgesic contract exists 08/26/2015  . Chronic pain syndrome 08/26/2015  . Chronic anticoagulation (Eliquis) 08/26/2015  . Clostridium difficile diarrhea 07/21/2015  . Diarrhea  07/19/2015  . Multiple myeloma ([place]) 05/04/2015  . BPH with obstruction/lower urinary tract symptoms 04/29/2015  . MI (mitral incompetence) 12/03/2014  . TI (tricuspid incompetence) 12/03/2014  . Paroxysmal atrial fibrillation ([place]) 12/03/2014  . Breathlessness on exertion 12/03/2014  . Essential (primary) hypertension 01/29/2014  . Acid reflux 01/29/2014  . Combined fat and carbohydrate induced hyperlipemia 01/29/2014  . Pulmonary embolism ([place]) 01/29/2014    Janna Arch, SPT  This entire session was performed under direct supervision and direction of a licensed therapist/therapist assistant . I have personally read, edited and approve of the note as written. Collie Siad PT, DPT 01/11/2017, 2:27 PM  [facility] [address] [place], [place], [address] Phone: [phone]   Fax:  [phone]  Name: Logan Perez MRN: [id number] Date of Birth: 1946-02-03

## 2017-01-15 ENCOUNTER — Other Ambulatory Visit: Payer: Self-pay | Admitting: Hematology and Oncology

## 2017-01-30 ENCOUNTER — Other Ambulatory Visit: Payer: Self-pay | Admitting: Hematology and Oncology

## 2017-01-30 DIAGNOSIS — C9001 Multiple myeloma in remission: Secondary | ICD-10-CM

## 2017-02-04 ENCOUNTER — Other Ambulatory Visit: Payer: Self-pay | Admitting: Hematology and Oncology

## 2017-02-04 DIAGNOSIS — Z5112 Encounter for antineoplastic immunotherapy: Secondary | ICD-10-CM | POA: Insufficient documentation

## 2017-02-04 NOTE — Progress Notes (Signed)
[facility] day:  02/05/17  Chief Complaint: Logan Perez is a 71 y.o. 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  HPI: The patient was last seen in the medical oncology clinic on 01/04/2017.  At that time, he felt alright.  He noted some right hip discomfort.  He was receiving physical therapy 2x/week.  Counts were improving.  He was gaining weight.  He received cycle #2 daratumumab.  During the interim, he has been active.  He is trying new projects.  He is fixing things around the house.  He feels that he is back to where he was before transplant. Physical therapy is done.   He has had some liquid stool with some leakage. He notes diarrhea for 2 years. He takes 2 Imodium in the morning then 1 with a bowel movement. He has been using more diarrhea lately. Diarrhea predates herDarzalex.  He has a history of lactose intolerance. He is drinking milk 12 ounces a day.  He is not using lactaid.   Past Medical History:  Diagnosis Date  . Anxiety   . Atrial fibrillation ([place])   . BPH (benign prostatic hyperplasia)   . Complication of anesthesia    BAD HEADACHE NIGHT OF FIRST CATARACT  . Difficulty voiding   . Dysrhythmia    A FIB  . Elevated PSA   . GERD (gastroesophageal reflux disease)   . HLD (hyperlipidemia)   . HOH (hard of hearing)   . Hypertension   . Multiple myeloma ([place])   . Neuropathy   . Pain    BACK  . Palpitations   . Pneumonia   . Pulmonary embolism ([place])   . Stroke [facility])    TIA    Past Surgical History:  Procedure Laterality Date  . BACK SURGERY  1994  . CATARACT EXTRACTION W/PHACO Right 05/02/2016   Procedure: CATARACT EXTRACTION PHACO AND INTRAOCULAR LENS PLACEMENT (IOC);  Surgeon: Birder Robson, MD;  Location: [facility] ORS;  Service: Ophthalmology;   Laterality: Right;  [place] 1.06 AP% 20.6 CDE 13.70 FLUID PACK LOT # [id number]  . CATARACT EXTRACTION W/PHACO Left 05/16/2016   Procedure: CATARACT EXTRACTION PHACO AND INTRAOCULAR LENS PLACEMENT (IOC);  Surgeon: Birder Robson, MD;  Location: [facility] ORS;  Service: Ophthalmology;  Laterality: Left;  [place] 01:43 AP% 19.8 CDE 20.45 FLUID PACK LOT #[id number] H  . KNEE ARTHROSCOPY Left 1992  . LIMBAL STEM CELL TRANSPLANT    . PAIN PUMP IMPLANTATION  2012  . PORTA CATH INSERTION N/A 12/04/2016   Procedure: Glori Luis Cath Insertion;  Surgeon: Algernon Huxley, MD;  Location: [facility];  Service: Cardiovascular;  Laterality: N/A;  . stem cell implant  2008   [facility]    Family History  Problem Relation Age of Onset  . Cancer Father     throat  . Kidney disease Sister   . Stroke    . Bladder Cancer Neg Hx   . Prostate cancer Neg Hx     Social History:  reports that he has quit smoking. His smoking use included Cigarettes. He has a 30.00 pack-year smoking history. He has quit using smokeless tobacco. He reports that he does not drink alcohol. His drug history is not on file.  He has a wire haired dachshund.  He lives in [place].  His wife's name is [name].  The patient is alone today.  Allergies:  Allergies  Allergen Reactions  . Azithromycin Diarrhea    Possible cause of C. Diff  . Pollen Extract Other (See Comments)    Sneezing, watery eyes, runny nose  . Zometa [Zoledronic Acid] Other (See Comments)    ONG- Osteonecrosis of the jaw   . Rivaroxaban Rash    Current Medications: Current Outpatient Prescriptions  Medication Sig Dispense Refill  . ALPRAZolam (XANAX) 0.25 MG tablet TAKE 1 TABLET BY MOUTH EVERY NIGHT AT BEDTIME FOR ANXIETY. 30 tablet 0  . baclofen (LIORESAL) 10 MG tablet Take 1 tablet (10 mg total) by mouth at bedtime. 30 each 3  . calcium citrate-vitamin D (CITRACAL+D) 315-200 MG-UNIT per tablet Take 1 tablet by mouth daily.     . cetirizine (ZYRTEC) 10 MG tablet Take 10 mg by  mouth daily as needed for allergies.     [name] dexamethasone (DECADRON) 4 MG tablet Take 4 tabs PO 1 hr prior to infusion, then take 1 tab on day 2 and day 3 30 tablet 1  . diltiazem (CARDIZEM CD) 120 MG 24 hr capsule Take by mouth.    . diltiazem (CARDIZEM) 60 MG tablet Take 60 mg by mouth as needed (raised heart rate).     [name] ELIQUIS 5 MG TABS tablet TAKE 1 TABLET(5 MG) BY MOUTH TWICE DAILY 60 tablet 0  . ENSURE (ENSURE) Take 237 mLs by mouth daily.    . Iron-Vitamins (GERITOL PO) Take 1 tablet by mouth daily.     [name] lidocaine-prilocaine (EMLA) cream Apply 1 application topically as needed. 30 g 0  . loperamide (IMODIUM) 2 MG capsule Take 2-4 mg by mouth as needed for diarrhea or loose stools. Reported on 04/26/2016    . lovastatin (MEVACOR) 20 MG tablet Take 20 mg by mouth every evening.     . montelukast (SINGULAIR) 10 MG tablet TAKE 1 TABLET BY MOUTH ONCE A DAY AS DIRECTED. (Patient taking differently: Take 78ms daily the day before the infusion, the day of, and the 2 days after infusion) 30 tablet 0  . naproxen sodium (ANAPROX) 220 MG tablet Take 220-440 mg by mouth 2 (two) times daily as needed (pain).    .Marland Kitchenomeprazole (PRILOSEC) 20 MG capsule Take 20 mg by mouth daily.     . potassium chloride SA (K-DUR,KLOR-CON) 20 MEQ tablet TAKE 1 TABLET(20 MEQ) BY MOUTH TWICE DAILY (Patient taking differently: TAKE 1 TABLET(20 MEQ) BY MOUTH DAILY) 180 tablet 0  . prochlorperazine (COMPAZINE) 10 MG tablet Take 10 mg by mouth every 6 (six) hours as needed for nausea or vomiting.    . ranitidine (ZANTAC) 150 MG tablet Take 150 mg by mouth at bedtime.     . tamsulosin (FLOMAX) 0.4 MG CAPS capsule Take 1 capsule (0.4 mg total) by mouth daily. 90 capsule 4  . valACYclovir (VALTREX) 500 MG tablet TAKE 1 TABLET BY MOUTH DAILY 30 tablet 0  . acetaminophen (TYLENOL) 500 MG tablet Take 1,000 mg by mouth every 8 (eight) hours as needed for mild pain or headache.     . diltiazem (DILACOR XR) 120 MG 24 hr capsule Take 120  mg by mouth daily.    . fluticasone (FLONASE) 50 MCG/ACT nasal spray Place 1 spray into the nose daily as needed for allergies.     .Marland KitchenPAIN MANAGEMENT IT PUMP REFILL 1 each by Intrathecal route once. Medication: PF Fentanyl 1,500.0 mcg/ml PF Bupivicaine 30.0 mg/ml PF Clonidine 300.015m/ml Total Volume: 40 ml Needed by  01-02-17 @ 1000 1 each 0   No current facility-administered medications for this visit.     Review of Systems:  GENERAL: Feels "good".  Energy level back to normal.  No fevers or sweats.  Weight stable.  PERFORMANCE STATUS (ECOG): 1 HEENT:  Cataracts s/p surgery.  Altered taste sensation.  No sore throat, mouth sores or tenderness. Lungs:  No shortness of breath or cough.  No hemoptysis. Cardiac:  No chest pain, palpitations, orthopnea, or PND. GI:  Appetite good.  Increased loose stool with leakage.  No vomiting, constipation, melena or hematochezia. GU:  No urgency, frequency, dysuria, or hematuria. Musculoskeletal:  Compression fracture of back on a pain pump. No muscle tenderness. Extremities:  No pain or swelling. Skin:  Fragile skin.  No rashes or skin changes. Neuro:  Little tremor (chronic).  Neuropathy in feet.  No headache, weakness, balance or coordination issues. Endocrine:  No diabetes, thyroid issues, hot flashes or night sweats. Psych:  No mood changes, depression or anxiety. Pain:  Chronic back pain (pain well managed with pump). Review of systems:  All other systems reviewed and found to be negative.   Physical Exam: Blood pressure (!) 157/83, pulse 69, temperature 97.1 F (36.2 C), resp. rate 16, height '5\' 7"'  (1.702 m), weight 151 lb 8 oz (68.7 kg), SpO2 100 %. GENERAL:  Thin elderly gentleman sitting comfortably in the exam room in no acute distress.  MENTAL STATUS:  Alert and oriented to person, place and time. HEAD:  Short brown hair.  Graying goatee.  Normocephalic, atraumatic, face symmetric, no Cushingoid features. EYES:  Glasses.  Blue eyes s/p  cataract surgery.  Pupils equal round and reactive to light and accomodation.  No conjunctivitis or scleral icterus. ENT:  Hearing aide.  Oropharynx clear without lesion.  Hearing aide.  Tongue normal. Mucous membranes moist.  RESPIRATORY:  Clear to auscultation without rales, wheezes or rhonchi. CARDIOVASCULAR:  Regular rate and rhythm without murmur, rub or gallop. ABDOMEN:  RUQ pain pump.  Soft, non-tender, with active bowel sounds, and no hepatosplenomegaly.  No masses. SKIN:  No rashes, ulcers or lesions. EXTREMITIES: Trace ankle edema.  No skin discoloration or tenderness.  No palpable cords. LYMPH NODES: No palpable cervical, supraclavicular, axillary or inguinal adenopathy  NEUROLOGICAL: Intention tremor. PSYCH:  Appropriate.    Appointment on 02/05/2017  Component Date Value Ref Range Status  . Sodium 02/05/2017 139  135 - 145 mmol/L Final  . Potassium 02/05/2017 3.9  3.5 - 5.1 mmol/L Final  . Chloride 02/05/2017 110  101 - 111 mmol/L Final  . CO2 02/05/2017 24  22 - 32 mmol/L Final  . Glucose, Bld 02/05/2017 109* 65 - 99 mg/dL Final  . BUN 02/05/2017 22* 6 - 20 mg/dL Final  . Creatinine, Ser 02/05/2017 1.16  0.61 - 1.24 mg/dL Final  . Calcium 02/05/2017 9.1  8.9 - 10.3 mg/dL Final  . Total Protein 02/05/2017 6.2* 6.5 - 8.1 g/dL Final  . Albumin 02/05/2017 4.0  3.5 - 5.0 g/dL Final  . AST 02/05/2017 20  15 - 41 U/L Final  . ALT 02/05/2017 14* 17 - 63 U/L Final  . Alkaline Phosphatase 02/05/2017 51  38 - 126 U/L Final  . Total Bilirubin 02/05/2017 0.7  0.3 - 1.2 mg/dL Final  . GFR calc non Af Amer 02/05/2017 >60  >60 mL/min Final  . GFR calc Af Amer 02/05/2017 >60  >60 mL/min Final   Comment: (NOTE) The eGFR has been calculated using the CKD EPI equation.  This calculation has not been validated in all clinical situations. eGFR's persistently <60 mL/min signify possible Chronic Kidney Disease.   . Anion gap 02/05/2017 5  5 - 15 Final  . WBC 02/05/2017 8.0  3.8 - 10.6 K/uL  Final  . RBC 02/05/2017 3.17* 4.40 - 5.90 MIL/uL Final  . Hemoglobin 02/05/2017 11.7* 13.0 - 18.0 g/dL Final  . HCT 02/05/2017 32.8* 40.0 - 52.0 % Final  . MCV 02/05/2017 103.5* 80.0 - 100.0 fL Final  . MCH 02/05/2017 36.9* 26.0 - 34.0 pg Final  . MCHC 02/05/2017 35.7  32.0 - 36.0 g/dL Final  . RDW 02/05/2017 13.5  11.5 - 14.5 % Final  . Platelets 02/05/2017 150  150 - 440 K/uL Final  . Neutrophils Relative % 02/05/2017 68  % Final  . Neutro Abs 02/05/2017 5.4  1.4 - 6.5 K/uL Final  . Lymphocytes Relative 02/05/2017 20  % Final  . Lymphs Abs 02/05/2017 1.6  1.0 - 3.6 K/uL Final  . Monocytes Relative 02/05/2017 10  % Final  . Monocytes Absolute 02/05/2017 0.8  0.2 - 1.0 K/uL Final  . Eosinophils Relative 02/05/2017 2  % Final  . Eosinophils Absolute 02/05/2017 0.1  0 - 0.7 K/uL Final  . Basophils Relative 02/05/2017 0  % Final  . Basophils Absolute 02/05/2017 0.0  0 - 0.1 K/uL Final  . Magnesium 02/05/2017 1.9  1.7 - 2.4 mg/dL Final    Assessment:  Logan Perez is a 71 y.o. male with stage III mutiple myeloma.  He initially presented with progressive back pain beginning in 12/2006.  MRI revealed "spots and compression fractures".  He began Velcade, thalidomide, and Decadron.  In 08/2007, he underwent high dose chemotherapy and autologous stem cell transplant.  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  He recurred with a rising M-spike (2.7) with repeat M spike (1.7 gm/dl) in 03/2010.  He was initially treated with Velcade (02/08/2010 - 05/10/2010).  He then began Revlimid (15 mg 3 weeks on/1 week off) and Decadron (40 mg on day 1, 8, 15, 22).  Because of significant side effect with Decadron his dose was decreased to 10 mg once a week in 07/2010.    He was on maintenance Revlimid. Revlimid was initially10 mg 3 weeks on/1 week off. This was changed to 10 mg 2 weeks on/2 weeks off secondary to right nipple tenderness. His dose was increased to 10 mg 3 weeks  on/1 week off with Decadron 10 mg a week (on Sundays) and then Revlamid 15 mg 3 weeks on and 1 week off with Decadron on Sundays.  He began Pomalyst 4 mg 3 weeks on/1 week off with Decadron on 08/27/2015.  Over the past year his SPEP has revealed no monoclonal protein (04/21/2015) and 0.5 gm/dL on 09/22/2015 and 10/20/2015. M spike was 0.1 on 02/02/2016, 03/01/2016, 03/29/2016, 04/26/2016, and 05/24/2016.  M spike was 0 on 10/11/2016, 11/28/2016, and 02/05/2017.  Free light chains have been monitored. Kappa free light chains were 18.54 on 11/21/2013, 18.37 on 02/20/2014, 18.93 on 05/22/2014, 32.58 (high; normal ratio 1.27) on 08/21/2014, 51.53 (high; elevated ratio of 2.12) on 11/13/2014, 28.08 (ratio 1.73) on 12/09/2014, 23.71 (ratio 2.17) on 01/18/2015, 92.93 (ratio 9.49) on 04/21/2015, 93.44 (ratio 10.28) on 05/26/2015, 255.45 (ratio of 24.05) on 07/14/2015, 373.89 (ratio 48.31) on 08/04/2015, 474.33 (ratio 70.58) on 08/25/2015, 450.76 (ratio 55.44) on 09/22/2015, 453.4 (ratio 59.89) on 10/20/2015, 58.26 (ratio of > 40.74) on 02/02/2016, 62.67 (ratio of 37.98) on 03/01/2016, 75.7 (ratio >  50.47) on 03/29/2016, 79.7 (ratio 53.13) on 04/26/2016, 108.6 (ratio > 72.4) on 05/24/2016, 8.3 (1.46 ratio) on 10/11/2016, and 6.7 (0.81 ratio) on 02/05/2017.  Bone survey on 12/08/2014 was stable.  Bone survey on 10/21/2015 revealed increase conspicuity of subcentimeter lytic lesions in the calvarium.  Bone marrow aspirate and biopsy on 11/04/2015 revealed an atypical monoclonal plasma cells estimated at 30-40% of marrow cells.   Marrow was variably cellular (approximately 45%) with background trilineage hematopoiesis. There was no significant increase in marrow reticulin fibers. Storage iron was present.    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 He develoed herpes zoster in 04/2008. He developed a pulmonary embolism in 05/2013. He was initially on  Xarelto, but is now on Eliquis. He had an episode of pneumonia around this time requiring a brief admission. He developed severe lower leg cramps on 08/07/2014 secondary to hypokalemia. Duplex was negative.   He was treated for C difficile colitis (Flagyl completed 07/30/2015).  He has a chronic indwelling pain pump.  He received 4 cycles of Pomalyst and Decadron (08/27/2015 - 11/19/2015).  Restaging studies document progressive disease.  Kappa free light chains are increasing.  SPEP revealed 0.5 gm/dL monoclonal protein then 1.3 gm/dL.  Bone survey reveals increase conspicuity of subcentimeter lytic lesions in the calvarium.  Bone marrow reveals 30-40% plasma cells.   MUGA on 11/30/2015 revealed an ejection fraction of 46%.  He is felt not to be a good candidate for Kyprolis.  There were no focal wall motion abnormalities.  He had a stress echo less than 1 year ago.  He has a history of PVCs and atrial fibrillation.  He takes Cardizem prn.  He received 17 weeks of daratumumab (Darzalex) (12/09/2015 - 05/25/2016).  He tolerated treatment well without side effect.  Bone marrow on 02/09/2016 revealed no diagnostic morphologic evidence of plasma cell myeloma.  Marrow was normocellular to hypocellular marrow for age (ranging from 10-40%) with maturing trilineage hematopoiesis and mild multilineage dyspoiesis.  There was patchy mild increase in reticulin.  Storage iron was present.  Flow cytometry revealed no definitive evidence of monoclonality.  There was a non-specific atypical myeloid and monocytic findings with no increase in blasts.  Cytogenetics were normal (46, XY).  He is currently day 230 s/p 2nd autologous stem cell transplant on 06/16/2016.  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  He also experienced atrial fibrillation with intermittent episodes of RVR requiring IV beta blockers.  He is on prophylactic valacyclovir for 1 year post  transplant.  He is s/p 2 cycles of daratumumab (Darzalex) post transplant (12/05/2016 -01/04/2017).  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  Work-up on 08/23/2016 revealed the following normal studies:  B12 and folate.  Ferritin was 1379 (high).  Iron studies included a saturation of 56% and TIBC 141 (low).  ESR was 64.  Thrombocytopenia has resolved.  He has a history of persistent hypomagnesemia and receives IV magnesium (2-4 gm) weekly.  He has not required magnesium since 10/24/2016.  Symptomatically, he notes increased diarrhea and stool incontinence.  He is lactose intolerant.  Exam is normal.  M spike is 0.  Plan:  1.  Labs today:  CBC with diff, CMP, Mg, SPEP, free light chains. 2.  Discuss diarrhea.  Etiology unclear.  Doubt related to daratumumab.  Discuss r/o infectious etiology.  Discuss lactose intolerance.  Discuss Lactaid and/or avoiding milk products.  Discuss  GI consult. 3.  Stool studies: C diff, O&P.  4.  Consider use of Lomotil if stool studies are negative. 5.  GI consult (Dr Allen Norris). 6.  No treatment today. 7.  RTC in 2 weeks for MD assessment, labs (CBC with diff, CMP, Mg), and cycle #3 daratumumab.  Addendum:  C diff and GI panel by PCR was negative.   Lequita Asal, MD  02/05/2017, 9:22 AM

## 2017-02-05 ENCOUNTER — Inpatient Hospital Stay: Payer: Medicare Other

## 2017-02-05 ENCOUNTER — Inpatient Hospital Stay: Payer: Medicare Other | Attending: Hematology and Oncology | Admitting: Hematology and Oncology

## 2017-02-05 ENCOUNTER — Encounter: Payer: Self-pay | Admitting: Hematology and Oncology

## 2017-02-05 VITALS — BP 146/85 | HR 66 | Temp 97.1°F | Resp 16 | Ht 67.0 in | Wt 151.5 lb

## 2017-02-05 DIAGNOSIS — C9002 Multiple myeloma in relapse: Secondary | ICD-10-CM | POA: Diagnosis not present

## 2017-02-05 DIAGNOSIS — Z8673 Personal history of transient ischemic attack (TIA), and cerebral infarction without residual deficits: Secondary | ICD-10-CM | POA: Diagnosis not present

## 2017-02-05 DIAGNOSIS — R1011 Right upper quadrant pain: Secondary | ICD-10-CM | POA: Diagnosis not present

## 2017-02-05 DIAGNOSIS — D638 Anemia in other chronic diseases classified elsewhere: Secondary | ICD-10-CM

## 2017-02-05 DIAGNOSIS — E739 Lactose intolerance, unspecified: Secondary | ICD-10-CM | POA: Diagnosis not present

## 2017-02-05 DIAGNOSIS — Z87311 Personal history of (healed) other pathological fracture: Secondary | ICD-10-CM | POA: Insufficient documentation

## 2017-02-05 DIAGNOSIS — K219 Gastro-esophageal reflux disease without esophagitis: Secondary | ICD-10-CM

## 2017-02-05 DIAGNOSIS — Z86711 Personal history of pulmonary embolism: Secondary | ICD-10-CM

## 2017-02-05 DIAGNOSIS — E785 Hyperlipidemia, unspecified: Secondary | ICD-10-CM | POA: Diagnosis not present

## 2017-02-05 DIAGNOSIS — N400 Enlarged prostate without lower urinary tract symptoms: Secondary | ICD-10-CM | POA: Diagnosis not present

## 2017-02-05 DIAGNOSIS — R197 Diarrhea, unspecified: Secondary | ICD-10-CM

## 2017-02-05 DIAGNOSIS — I4891 Unspecified atrial fibrillation: Secondary | ICD-10-CM

## 2017-02-05 DIAGNOSIS — C9001 Multiple myeloma in remission: Secondary | ICD-10-CM

## 2017-02-05 DIAGNOSIS — Z8739 Personal history of other diseases of the musculoskeletal system and connective tissue: Secondary | ICD-10-CM | POA: Diagnosis not present

## 2017-02-05 DIAGNOSIS — M549 Dorsalgia, unspecified: Secondary | ICD-10-CM

## 2017-02-05 DIAGNOSIS — R159 Full incontinence of feces: Secondary | ICD-10-CM

## 2017-02-05 DIAGNOSIS — R439 Unspecified disturbances of smell and taste: Secondary | ICD-10-CM | POA: Diagnosis not present

## 2017-02-05 DIAGNOSIS — Z79899 Other long term (current) drug therapy: Secondary | ICD-10-CM | POA: Diagnosis not present

## 2017-02-05 DIAGNOSIS — G8929 Other chronic pain: Secondary | ICD-10-CM

## 2017-02-05 DIAGNOSIS — Z9221 Personal history of antineoplastic chemotherapy: Secondary | ICD-10-CM

## 2017-02-05 DIAGNOSIS — Z9484 Stem cells transplant status: Secondary | ICD-10-CM | POA: Diagnosis not present

## 2017-02-05 DIAGNOSIS — Z87891 Personal history of nicotine dependence: Secondary | ICD-10-CM

## 2017-02-05 DIAGNOSIS — Z5112 Encounter for antineoplastic immunotherapy: Secondary | ICD-10-CM

## 2017-02-05 LAB — CBC WITH DIFFERENTIAL/PLATELET
Basophils Absolute: 0 10*3/uL (ref 0–0.1)
Basophils Relative: 0 %
Eosinophils Absolute: 0.1 10*3/uL (ref 0–0.7)
Eosinophils Relative: 2 %
HCT: 32.8 % — ABNORMAL LOW (ref 40.0–52.0)
Hemoglobin: 11.7 g/dL — ABNORMAL LOW (ref 13.0–18.0)
Lymphocytes Relative: 20 %
Lymphs Abs: 1.6 10*3/uL (ref 1.0–3.6)
MCH: 36.9 pg — ABNORMAL HIGH (ref 26.0–34.0)
MCHC: 35.7 g/dL (ref 32.0–36.0)
MCV: 103.5 fL — ABNORMAL HIGH (ref 80.0–100.0)
Monocytes Absolute: 0.8 10*3/uL (ref 0.2–1.0)
Monocytes Relative: 10 %
Neutro Abs: 5.4 10*3/uL (ref 1.4–6.5)
Neutrophils Relative %: 68 %
Platelets: 150 10*3/uL (ref 150–440)
RBC: 3.17 MIL/uL — ABNORMAL LOW (ref 4.40–5.90)
RDW: 13.5 % (ref 11.5–14.5)
WBC: 8 10*3/uL (ref 3.8–10.6)

## 2017-02-05 LAB — GASTROINTESTINAL PANEL BY PCR, STOOL (REPLACES STOOL CULTURE)

## 2017-02-05 LAB — C DIFFICILE QUICK SCREEN W PCR REFLEX
C Diff antigen: NEGATIVE
C Diff interpretation: NOT DETECTED
C Diff toxin: NEGATIVE

## 2017-02-05 LAB — COMPREHENSIVE METABOLIC PANEL
ALT: 14 U/L — ABNORMAL LOW (ref 17–63)
AST: 20 U/L (ref 15–41)
Albumin: 4 g/dL (ref 3.5–5.0)
Alkaline Phosphatase: 51 U/L (ref 38–126)
Anion gap: 5 (ref 5–15)
BUN: 22 mg/dL — ABNORMAL HIGH (ref 6–20)
CO2: 24 mmol/L (ref 22–32)
Calcium: 9.1 mg/dL (ref 8.9–10.3)
Chloride: 110 mmol/L (ref 101–111)
Creatinine, Ser: 1.16 mg/dL (ref 0.61–1.24)
GFR calc Af Amer: >60 mL/min (ref >60)
GFR calc non Af Amer: >60 mL/min (ref >60)
Glucose, Bld: 109 mg/dL — ABNORMAL HIGH (ref 65–99)
Potassium: 3.9 mmol/L (ref 3.5–5.1)
Sodium: 139 mmol/L (ref 135–145)
Total Bilirubin: 0.7 mg/dL (ref 0.3–1.2)
Total Protein: 6.2 g/dL — ABNORMAL LOW (ref 6.5–8.1)

## 2017-02-05 LAB — MAGNESIUM: Magnesium: 1.9 mg/dL (ref 1.7–2.4)

## 2017-02-05 MED ORDER — HEPARIN SOD (PORK) LOCK FLUSH 100 UNIT/ML IV SOLN
500.0000 [IU] | Freq: Once | INTRAVENOUS | Status: DC
  Administered 2017-02-05: 500 [IU] via INTRAVENOUS
  Filled 2017-02-05: qty 5

## 2017-02-05 MED ORDER — SODIUM CHLORIDE 0.9% FLUSH
10.0000 mL | INTRAVENOUS | Status: DC | PRN
  Administered 2017-02-05: 10 mL via INTRAVENOUS
  Filled 2017-02-05: qty 10

## 2017-02-06 LAB — PROTEIN ELECTROPHORESIS, SERUM
A/G Ratio: 2.1 — ABNORMAL HIGH (ref 0.7–1.7)
Albumin ELP: 3.9 g/dL (ref 2.9–4.4)
Alpha-1-Globulin: 0.1 g/dL (ref 0.0–0.4)
Alpha-2-Globulin: 0.7 g/dL (ref 0.4–1.0)
Beta Globulin: 0.8 g/dL (ref 0.7–1.3)
Gamma Globulin: 0.3 g/dL — ABNORMAL LOW (ref 0.4–1.8)
Globulin, Total: 1.9 g/dL — ABNORMAL LOW (ref 2.2–3.9)
Total Protein ELP: 5.8 g/dL — ABNORMAL LOW (ref 6.0–8.5)

## 2017-02-06 LAB — KAPPA/LAMBDA LIGHT CHAINS
Kappa free light chain: 6.7 mg/L (ref 3.3–19.4)
Kappa, lambda light chain ratio: 0.81 (ref 0.26–1.65)
Lambda free light chains: 8.3 mg/L (ref 5.7–26.3)

## 2017-02-08 ENCOUNTER — Other Ambulatory Visit: Payer: Self-pay | Admitting: Hematology and Oncology

## 2017-02-18 ENCOUNTER — Other Ambulatory Visit: Payer: Self-pay | Admitting: Hematology and Oncology

## 2017-02-19 ENCOUNTER — Inpatient Hospital Stay: Payer: Medicare Other

## 2017-02-19 ENCOUNTER — Inpatient Hospital Stay: Payer: Medicare Other | Attending: Hematology and Oncology

## 2017-02-19 ENCOUNTER — Encounter: Payer: Self-pay | Admitting: Hematology and Oncology

## 2017-02-19 ENCOUNTER — Other Ambulatory Visit: Payer: Self-pay | Admitting: Hematology and Oncology

## 2017-02-19 ENCOUNTER — Other Ambulatory Visit: Payer: Self-pay | Admitting: *Deleted

## 2017-02-19 ENCOUNTER — Inpatient Hospital Stay (HOSPITAL_BASED_OUTPATIENT_CLINIC_OR_DEPARTMENT_OTHER): Payer: Medicare Other | Admitting: Hematology and Oncology

## 2017-02-19 VITALS — BP 137/75 | HR 82 | Temp 97.0°F | Resp 16 | Ht 67.0 in | Wt 151.4 lb

## 2017-02-19 DIAGNOSIS — Z8673 Personal history of transient ischemic attack (TIA), and cerebral infarction without residual deficits: Secondary | ICD-10-CM | POA: Insufficient documentation

## 2017-02-19 DIAGNOSIS — E785 Hyperlipidemia, unspecified: Secondary | ICD-10-CM | POA: Insufficient documentation

## 2017-02-19 DIAGNOSIS — C9002 Multiple myeloma in relapse: Secondary | ICD-10-CM

## 2017-02-19 DIAGNOSIS — C9001 Multiple myeloma in remission: Secondary | ICD-10-CM

## 2017-02-19 DIAGNOSIS — K219 Gastro-esophageal reflux disease without esophagitis: Secondary | ICD-10-CM | POA: Diagnosis not present

## 2017-02-19 DIAGNOSIS — Z87891 Personal history of nicotine dependence: Secondary | ICD-10-CM | POA: Insufficient documentation

## 2017-02-19 DIAGNOSIS — Z8739 Personal history of other diseases of the musculoskeletal system and connective tissue: Secondary | ICD-10-CM

## 2017-02-19 DIAGNOSIS — K029 Dental caries, unspecified: Secondary | ICD-10-CM | POA: Insufficient documentation

## 2017-02-19 DIAGNOSIS — Z5112 Encounter for antineoplastic immunotherapy: Secondary | ICD-10-CM | POA: Insufficient documentation

## 2017-02-19 DIAGNOSIS — I10 Essential (primary) hypertension: Secondary | ICD-10-CM

## 2017-02-19 DIAGNOSIS — Z9484 Stem cells transplant status: Secondary | ICD-10-CM | POA: Insufficient documentation

## 2017-02-19 DIAGNOSIS — I4891 Unspecified atrial fibrillation: Secondary | ICD-10-CM | POA: Insufficient documentation

## 2017-02-19 DIAGNOSIS — C9000 Multiple myeloma not having achieved remission: Secondary | ICD-10-CM

## 2017-02-19 DIAGNOSIS — R197 Diarrhea, unspecified: Secondary | ICD-10-CM | POA: Diagnosis not present

## 2017-02-19 DIAGNOSIS — Z86711 Personal history of pulmonary embolism: Secondary | ICD-10-CM | POA: Diagnosis not present

## 2017-02-19 DIAGNOSIS — E739 Lactose intolerance, unspecified: Secondary | ICD-10-CM | POA: Diagnosis not present

## 2017-02-19 DIAGNOSIS — Z7189 Other specified counseling: Secondary | ICD-10-CM

## 2017-02-19 DIAGNOSIS — Z79899 Other long term (current) drug therapy: Secondary | ICD-10-CM | POA: Diagnosis not present

## 2017-02-19 DIAGNOSIS — D638 Anemia in other chronic diseases classified elsewhere: Secondary | ICD-10-CM | POA: Insufficient documentation

## 2017-02-19 DIAGNOSIS — Z800 Family history of malignant neoplasm of digestive organs: Secondary | ICD-10-CM

## 2017-02-19 LAB — CBC WITH DIFFERENTIAL/PLATELET
Basophils Absolute: 0.1 10*3/uL (ref 0–0.1)
Basophils Relative: 1 %
Eosinophils Absolute: 0.1 10*3/uL (ref 0–0.7)
Eosinophils Relative: 1 %
HCT: 32.2 % — ABNORMAL LOW (ref 40.0–52.0)
Hemoglobin: 11.7 g/dL — ABNORMAL LOW (ref 13.0–18.0)
Lymphocytes Relative: 12 %
Lymphs Abs: 1.2 10*3/uL (ref 1.0–3.6)
MCH: 37.3 pg — ABNORMAL HIGH (ref 26.0–34.0)
MCHC: 36.2 g/dL — ABNORMAL HIGH (ref 32.0–36.0)
MCV: 103.1 fL — ABNORMAL HIGH (ref 80.0–100.0)
Monocytes Absolute: 0.7 10*3/uL (ref 0.2–1.0)
Monocytes Relative: 7 %
Neutro Abs: 7.9 10*3/uL — ABNORMAL HIGH (ref 1.4–6.5)
Neutrophils Relative %: 79 %
Platelets: 153 10*3/uL (ref 150–440)
RBC: 3.13 MIL/uL — ABNORMAL LOW (ref 4.40–5.90)
RDW: 13.6 % (ref 11.5–14.5)
WBC: 9.9 10*3/uL (ref 3.8–10.6)

## 2017-02-19 LAB — COMPREHENSIVE METABOLIC PANEL
ALT: 14 U/L — ABNORMAL LOW (ref 17–63)
AST: 19 U/L (ref 15–41)
Albumin: 3.9 g/dL (ref 3.5–5.0)
Alkaline Phosphatase: 47 U/L (ref 38–126)
Anion gap: 7 (ref 5–15)
BUN: 25 mg/dL — ABNORMAL HIGH (ref 6–20)
CO2: 24 mmol/L (ref 22–32)
Calcium: 9.4 mg/dL (ref 8.9–10.3)
Chloride: 106 mmol/L (ref 101–111)
Creatinine, Ser: 1.28 mg/dL — ABNORMAL HIGH (ref 0.61–1.24)
GFR calc Af Amer: >60 mL/min (ref >60)
GFR calc non Af Amer: 55 mL/min — ABNORMAL LOW (ref >60)
Glucose, Bld: 123 mg/dL — ABNORMAL HIGH (ref 65–99)
Potassium: 4.1 mmol/L (ref 3.5–5.1)
Sodium: 137 mmol/L (ref 135–145)
Total Bilirubin: 0.6 mg/dL (ref 0.3–1.2)
Total Protein: 5.8 g/dL — ABNORMAL LOW (ref 6.5–8.1)

## 2017-02-19 LAB — MAGNESIUM: Magnesium: 1.9 mg/dL (ref 1.7–2.4)

## 2017-02-19 MED ORDER — DARATUMUMAB CHEMO INJECTION 400 MG/20ML
1000.0000 mg | Freq: Once | INTRAVENOUS | Status: AC
  Administered 2017-02-19: 1000 mg via INTRAVENOUS
  Filled 2017-02-19: qty 50

## 2017-02-19 MED ORDER — ACETAMINOPHEN 325 MG PO TABS
650.0000 mg | ORAL_TABLET | Freq: Once | ORAL | Status: AC
  Administered 2017-02-19: 650 mg via ORAL
  Filled 2017-02-19: qty 2

## 2017-02-19 MED ORDER — ONDANSETRON 8 MG PO TBDP
8.0000 mg | ORAL_TABLET | Freq: Once | ORAL | Status: AC
  Administered 2017-02-19: 8 mg via ORAL
  Filled 2017-02-19: qty 1

## 2017-02-19 MED ORDER — SODIUM CHLORIDE 0.9% FLUSH
10.0000 mL | INTRAVENOUS | Status: DC | PRN
  Administered 2017-02-19: 10 mL
  Filled 2017-02-19: qty 10

## 2017-02-19 MED ORDER — HEPARIN SOD (PORK) LOCK FLUSH 100 UNIT/ML IV SOLN
500.0000 [IU] | Freq: Once | INTRAVENOUS | Status: AC | PRN
  Administered 2017-02-19: 500 [IU]
  Filled 2017-02-19: qty 5

## 2017-02-19 MED ORDER — DIPHENHYDRAMINE HCL 25 MG PO CAPS
50.0000 mg | ORAL_CAPSULE | Freq: Once | ORAL | Status: AC
  Administered 2017-02-19: 50 mg via ORAL
  Filled 2017-02-19: qty 2

## 2017-02-19 MED ORDER — SODIUM CHLORIDE 0.9 % IV SOLN
Freq: Once | INTRAVENOUS | Status: AC
  Administered 2017-02-19: 10:00:00 via INTRAVENOUS
  Filled 2017-02-19: qty 1000

## 2017-02-19 NOTE — Progress Notes (Signed)
Patient here for pre treatment check. No changes since last appointment. 

## 2017-02-19 NOTE — Progress Notes (Signed)
[facility] day:  02/19/17  Chief Complaint: Osei Anger is a 71 y.o. 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  HPI: The patient was last seen in the medical oncology clinic on 02/05/2017.  At that time, he had increased diarrhea and stool incontinence.  He was lactose intolerant and had been drinking milk.  Treatment was held.  We discussed lactaid.  C diff and GI panel by PCR was negative.  Given his chronic history of diarrhea and recent worsening, GI was consulted.  During the interim, he feels like he has "turned the corner". He notes no leakage of stool. Stool has bulked up.  He has been drinking milk with a Lactaid. He has had no diarrhea. He is drinking water. He still can have 3 stools in a day. He will take 2 then 1 Imodium. The next day, he has no diarrhea. The third day he has 1-2 bowel movements. On the 3rd day he takes 1 Imodium.  He is scheduled for a dental procedure with a cavity filling in 03/2017.   Past Medical History:  Diagnosis Date  . Anxiety   . Atrial fibrillation ([place])   . BPH (benign prostatic hyperplasia)   . Complication of anesthesia    BAD HEADACHE NIGHT OF FIRST CATARACT  . Difficulty voiding   . Dysrhythmia    A FIB  . Elevated PSA   . GERD (gastroesophageal reflux disease)   . HLD (hyperlipidemia)   . HOH (hard of hearing)   . Hypertension   . Multiple myeloma ([place])   . Neuropathy   . Pain    BACK  . Palpitations   . Pneumonia   . Pulmonary embolism ([place])   . Stroke [facility])    TIA    Past Surgical History:  Procedure Laterality Date  . BACK SURGERY  1994  . CATARACT EXTRACTION W/PHACO Right 05/02/2016   Procedure: CATARACT EXTRACTION PHACO AND INTRAOCULAR LENS PLACEMENT (IOC);  Surgeon: Birder Robson, MD;  Location: [facility] ORS;   Service: Ophthalmology;  Laterality: Right;  [place] 1.06 AP% 20.6 CDE 13.70 FLUID PACK LOT # [id number]  . CATARACT EXTRACTION W/PHACO Left 05/16/2016   Procedure: CATARACT EXTRACTION PHACO AND INTRAOCULAR LENS PLACEMENT (IOC);  Surgeon: Birder Robson, MD;  Location: [facility] ORS;  Service: Ophthalmology;  Laterality: Left;  [place] 01:43 AP% 19.8 CDE 20.45 FLUID PACK LOT #[id number]  . KNEE ARTHROSCOPY Left 1992  . LIMBAL STEM CELL TRANSPLANT    . PAIN PUMP IMPLANTATION  2012  . PORTA CATH INSERTION N/A 12/04/2016   Procedure: Glori Luis Cath Insertion;  Surgeon: Algernon Huxley, MD;  Location: [place] CV LAB;  Service: Cardiovascular;  Laterality: N/A;  . stem cell implant  2008   [facility]    Family History  Problem Relation Age of Onset  . Cancer Father        throat  . Kidney disease Sister   . Stroke Unknown   . Bladder Cancer Neg Hx   . Prostate cancer Neg Hx     Social History:  reports that he has quit smoking. His smoking use included Cigarettes. He has a 30.00 pack-year smoking history. He has quit using smokeless tobacco. He reports that he does not drink alcohol. His drug history is not on file.  He has a wire haired dachshund.  He lives in [place].  His wife's name is [name].  The patient is alone today.  Allergies:  Allergies  Allergen Reactions  . Azithromycin Diarrhea    Possible cause of C. Diff  . Pollen Extract Other (See Comments)    Sneezing, watery eyes, runny nose  . Zometa [Zoledronic Acid] Other (See Comments)    ONG- Osteonecrosis of the jaw   . Rivaroxaban Rash    Current Medications: Current Outpatient Prescriptions  Medication Sig Dispense Refill  . acetaminophen (TYLENOL) 500 MG tablet Take 1,000 mg by mouth every 8 (eight) hours as needed for mild pain or headache.     . ALPRAZolam (XANAX) 0.25 MG tablet TAKE 1 TABLET BY MOUTH EVERY NIGHT AT BEDTIME AS NEEDED FOR ANXIETY 30 tablet 0  . baclofen (LIORESAL) 10 MG tablet Take 1 tablet (10 mg total) by mouth at  bedtime. 30 each 3  . calcium citrate-vitamin D (CITRACAL+D) 315-200 MG-UNIT per tablet Take 1 tablet by mouth daily.     . cetirizine (ZYRTEC) 10 MG tablet Take 10 mg by mouth daily as needed for allergies.     [name] dexamethasone (DECADRON) 4 MG tablet Take 4 tabs PO 1 hr prior to infusion, then take 1 tab on day 2 and day 3 30 tablet 1  . diltiazem (CARDIZEM CD) 120 MG 24 hr capsule Take by mouth.    . diltiazem (CARDIZEM) 60 MG tablet Take 60 mg by mouth as needed (raised heart rate).     [name] diltiazem (DILACOR XR) 120 MG 24 hr capsule Take 120 mg by mouth daily.    [name] ELIQUIS 5 MG TABS tablet TAKE 1 TABLET(5 MG) BY MOUTH TWICE DAILY 60 tablet 0  . ENSURE (ENSURE) Take 237 mLs by mouth daily.    . fluticasone (FLONASE) 50 MCG/ACT nasal spray Place 1 spray into the nose daily as needed for allergies.     . Iron-Vitamins (GERITOL PO) Take 1 tablet by mouth daily.     [name] lidocaine-prilocaine (EMLA) cream Apply 1 application topically as needed. 30 g 0  . loperamide (IMODIUM) 2 MG capsule Take 2-4 mg by mouth as needed for diarrhea or loose stools. Reported on 04/26/2016    . lovastatin (MEVACOR) 20 MG tablet Take 20 mg by mouth every evening.     . montelukast (SINGULAIR) 10 MG tablet TAKE 1 TABLET BY MOUTH ONCE A DAY AS DIRECTED. (Patient taking differently: Take 32ms daily the day before the infusion, the day of, and the 2 days after infusion) 30 tablet 0  . naproxen sodium (ANAPROX) 220 MG tablet Take 220-440 mg by mouth 2 (two) times daily as needed (pain).    .Marland Kitchenomeprazole (PRILOSEC) 20 MG capsule Take 20 mg by mouth daily.     . potassium chloride SA (K-DUR,KLOR-CON) 20 MEQ tablet TAKE 1 TABLET(20 MEQ) BY MOUTH TWICE DAILY (Patient taking differently: TAKE 1 TABLET(20 MEQ) BY MOUTH DAILY) 180 tablet 0  . prochlorperazine (COMPAZINE) 10 MG tablet Take 10 mg by mouth every 6 (six) hours as needed for nausea or vomiting.    . ranitidine (ZANTAC) 150 MG tablet Take 150 mg by mouth at bedtime.     .  tamsulosin (FLOMAX) 0.4 MG CAPS capsule Take 1 capsule (0.4 mg total) by mouth daily. 90 capsule 4  . valACYclovir (VALTREX) 500 MG tablet TAKE 1 TABLET BY MOUTH DAILY 30 tablet 0  . PAIN MANAGEMENT IT PUMP REFILL 1 each by Intrathecal route once. Medication: PF Fentanyl 1,500.0 mcg/ml  PF Bupivicaine 30.0 mg/ml PF Clonidine 300.81mg/ml Total Volume: 40 ml Needed by 01-02-17 @ 1000 1 each 0   No current facility-administered medications for this visit.     Review of Systems:  GENERAL: Feels "good".  Energy levelnormal.  No fevers or sweats.  Weight stable.  PERFORMANCE STATUS (ECOG): 1 HEENT:  Cataracts s/p surgery.  Altered taste sensation.  No sore throat, mouth sores or tenderness. Lungs:  No shortness of breath or cough.  No hemoptysis. Cardiac:  No chest pain, palpitations, orthopnea, or PND. GI:  Appetite good. Diarrhea, improved (see HPI).  No vomiting, constipation, melena or hematochezia. GU:  No urgency, frequency, dysuria, or hematuria. Musculoskeletal:  Compression fracture of back on a pain pump. No muscle tenderness. Extremities:  No pain or swelling. Skin:  Fragile skin.  No rashes or skin changes. Neuro:  Little tremor (chronic).  Neuropathy in feet.  No headache, weakness, balance or coordination issues. Endocrine:  No diabetes, thyroid issues, hot flashes or night sweats. Psych:  No mood changes, depression or anxiety. Pain:  Chronic back pain (pain well managed with pump). Review of systems:  All other systems reviewed and found to be negative.   Physical Exam: Blood pressure 137/75, pulse 82, temperature 97 F (36.1 C), temperature source Tympanic, resp. rate 16, height '5\' 7"'  (1.702 m), weight 151 lb 6.4 oz (68.7 kg). GENERAL:  Thin elderly gentleman sitting comfortably in the exam room in no acute distress.  MENTAL STATUS:  Alert and oriented to person, place and time. HEAD:  Short brown hair.  Graying goatee.  Normocephalic, atraumatic, face symmetric, no  Cushingoid features. EYES:  Glasses.  Blue eyes s/p cataract surgery.  Pupils equal round and reactive to light and accomodation.  No conjunctivitis or scleral icterus. ENT:  Hearing aide.  Oropharynx clear without lesion.  Hearing aide.  Tongue normal. Mucous membranes moist.  RESPIRATORY:  Clear to auscultation without rales, wheezes or rhonchi. CARDIOVASCULAR:  Regular rate and rhythm without murmur, rub or gallop. ABDOMEN:  RUQ pain pump.  Soft, non-tender, with active bowel sounds, and no hepatosplenomegaly.  No masses. SKIN:  No rashes, ulcers or lesions. EXTREMITIES: Trace ankle edema.  No skin discoloration or tenderness.  No palpable cords. LYMPH NODES: No palpable cervical, supraclavicular, axillary or inguinal adenopathy  NEUROLOGICAL: Intention tremor. PSYCH:  Appropriate.    Appointment on 02/19/2017  Component Date Value Ref Range Status  . WBC 02/19/2017 9.9  3.8 - 10.6 K/uL Final  . RBC 02/19/2017 3.13* 4.40 - 5.90 MIL/uL Final  . Hemoglobin 02/19/2017 11.7* 13.0 - 18.0 g/dL Final  . HCT 02/19/2017 32.2* 40.0 - 52.0 % Final  . MCV 02/19/2017 103.1* 80.0 - 100.0 fL Final  . MCH 02/19/2017 37.3* 26.0 - 34.0 pg Final  . MCHC 02/19/2017 36.2* 32.0 - 36.0 g/dL Final  . RDW 02/19/2017 13.6  11.5 - 14.5 % Final  . Platelets 02/19/2017 153  150 - 440 K/uL Final  . Neutrophils Relative % 02/19/2017 79  % Final  . Neutro Abs 02/19/2017 7.9* 1.4 - 6.5 K/uL Final  . Lymphocytes Relative 02/19/2017 12  % Final  . Lymphs Abs 02/19/2017 1.2  1.0 - 3.6 K/uL Final  . Monocytes Relative 02/19/2017 7  % Final  . Monocytes Absolute 02/19/2017 0.7  0.2 - 1.0 K/uL Final  . Eosinophils Relative 02/19/2017 1  % Final  . Eosinophils Absolute 02/19/2017 0.1  0 - 0.7 K/uL Final  . Basophils Relative 02/19/2017 1  % Final  .  Basophils Absolute 02/19/2017 0.1  0 - 0.1 K/uL Final  . Sodium 02/19/2017 137  135 - 145 mmol/L Final  . Potassium 02/19/2017 4.1  3.5 - 5.1 mmol/L Final  . Chloride  02/19/2017 106  101 - 111 mmol/L Final  . CO2 02/19/2017 24  22 - 32 mmol/L Final  . Glucose, Bld 02/19/2017 123* 65 - 99 mg/dL Final  . BUN 02/19/2017 25* 6 - 20 mg/dL Final  . Creatinine, Ser 02/19/2017 1.28* 0.61 - 1.24 mg/dL Final  . Calcium 02/19/2017 9.4  8.9 - 10.3 mg/dL Final  . Total Protein 02/19/2017 5.8* 6.5 - 8.1 g/dL Final  . Albumin 02/19/2017 3.9  3.5 - 5.0 g/dL Final  . AST 02/19/2017 19  15 - 41 U/L Final  . ALT 02/19/2017 14* 17 - 63 U/L Final  . Alkaline Phosphatase 02/19/2017 47  38 - 126 U/L Final  . Total Bilirubin 02/19/2017 0.6  0.3 - 1.2 mg/dL Final  . GFR calc non Af Amer 02/19/2017 55* >60 mL/min Final  . GFR calc Af Amer 02/19/2017 >60  >60 mL/min Final   Comment: (NOTE) The eGFR has been calculated using the CKD EPI equation. This calculation has not been validated in all clinical situations. eGFR's persistently <60 mL/min signify possible Chronic Kidney Disease.   . Anion gap 02/19/2017 7  5 - 15 Final  . Magnesium 02/19/2017 1.9  1.7 - 2.4 mg/dL Final    Assessment:  Logan Perez is a 71 y.o. male with stage III mutiple myeloma.  He initially presented with progressive back pain beginning in 12/2006.  MRI revealed "spots and compression fractures".  He began Velcade, thalidomide, and Decadron.  In 08/2007, he underwent high dose chemotherapy and autologous stem cell transplant.  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  He recurred with a rising M-spike (2.7) with repeat M spike (1.7 gm/dl) in 03/2010.  He was initially treated with Velcade (02/08/2010 - 05/10/2010).  He then began Revlimid (15 mg 3 weeks on/1 week off) and Decadron (40 mg on day 1, 8, 15, 22).  Because of significant side effect with Decadron his dose was decreased to 10 mg once a week in 07/2010.    He was on maintenance Revlimid. Revlimid was initially10 mg 3 weeks on/1 week off. This was changed to 10 mg 2 weeks on/2 weeks off secondary to right nipple  tenderness. His dose was increased to 10 mg 3 weeks on/1 week off with Decadron 10 mg a week (on Sundays) and then Revlamid 15 mg 3 weeks on and 1 week off with Decadron on Sundays.  He began Pomalyst 4 mg 3 weeks on/1 week off with Decadron on 08/27/2015.  Over the past year his SPEP has revealed no monoclonal protein (04/21/2015) and 0.5 gm/dL on 09/22/2015 and 10/20/2015. M spike was 0.1 on 02/02/2016, 03/01/2016, 03/29/2016, 04/26/2016, and 05/24/2016.  M spike was 0 on 10/11/2016, 11/28/2016, and 02/05/2017.  Free light chains have been monitored. Kappa free light chains were 18.54 on 11/21/2013, 18.37 on 02/20/2014, 18.93 on 05/22/2014, 32.58 (high; normal ratio 1.27) on 08/21/2014, 51.53 (high; elevated ratio of 2.12) on 11/13/2014, 28.08 (ratio 1.73) on 12/09/2014, 23.71 (ratio 2.17) on 01/18/2015, 92.93 (ratio 9.49) on 04/21/2015, 93.44 (ratio 10.28) on 05/26/2015, 255.45 (ratio of 24.05) on 07/14/2015, 373.89 (ratio 48.31) on 08/04/2015, 474.33 (ratio 70.58) on 08/25/2015, 450.76 (ratio 55.44) on 09/22/2015, 453.4 (ratio 59.89) on 10/20/2015, 58.26 (ratio of > 40.74) on 02/02/2016, 62.67 (ratio of 37.98) on 03/01/2016,  75.7 (ratio > 50.47) on 03/29/2016, 79.7 (ratio 53.13) on 04/26/2016, 108.6 (ratio > 72.4) on 05/24/2016, 8.3 (1.46 ratio) on 10/11/2016, and 6.7 (0.81 ratio) on 02/05/2017.  Bone survey on 12/08/2014 was stable.  Bone survey on 10/21/2015 revealed increase conspicuity of subcentimeter lytic lesions in the calvarium.  Bone marrow aspirate and biopsy on 11/04/2015 revealed an atypical monoclonal plasma cells estimated at 30-40% of marrow cells.   Marrow was variably cellular (approximately 45%) with background trilineage hematopoiesis. There was no significant increase in marrow reticulin fibers. Storage iron was present.    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 He develoed herpes zoster in 04/2008. He developed a  pulmonary embolism in 05/2013. He was initially on Xarelto, but is now on Eliquis. He had an episode of pneumonia around this time requiring a brief admission. He developed severe lower leg cramps on 08/07/2014 secondary to hypokalemia. Duplex was negative.   He was treated for C difficile colitis (Flagyl completed 07/30/2015).  He has a chronic indwelling pain pump.  He received 4 cycles of Pomalyst and Decadron (08/27/2015 - 11/19/2015).  Restaging studies document progressive disease.  Kappa free light chains are increasing.  SPEP revealed 0.5 gm/dL monoclonal protein then 1.3 gm/dL.  Bone survey reveals increase conspicuity of subcentimeter lytic lesions in the calvarium.  Bone marrow reveals 30-40% plasma cells.   MUGA on 11/30/2015 revealed an ejection fraction of 46%.  He is felt not to be a good candidate for Kyprolis.  There were no focal wall motion abnormalities.  He had a stress echo less than 1 year ago.  He has a history of PVCs and atrial fibrillation.  He takes Cardizem prn.  He received 17 weeks of daratumumab (Darzalex) (12/09/2015 - 05/25/2016).  He tolerated treatment well without side effect.  Bone marrow on 02/09/2016 revealed no diagnostic morphologic evidence of plasma cell myeloma.  Marrow was normocellular to hypocellular marrow for age (ranging from 10-40%) with maturing trilineage hematopoiesis and mild multilineage dyspoiesis.  There was patchy mild increase in reticulin.  Storage iron was present.  Flow cytometry revealed no definitive evidence of monoclonality.  There was a non-specific atypical myeloid and monocytic findings with no increase in blasts.  Cytogenetics were normal (46, XY).  He is currently day 244 s/p 2nd autologous stem cell transplant on 06/16/2016.  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  He also experienced atrial fibrillation with intermittent episodes of RVR requiring IV beta blockers.  He is  on prophylactic valacyclovir for 1 year post transplant.  He is s/p 2 cycles of daratumumab (Darzalex) post transplant (12/05/2016 -01/04/2017).  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  Work-up on 08/23/2016 revealed the following normal studies:  B12 and folate.  Ferritin was 1379 (high).  Iron studies included a saturation of 56% and TIBC 141 (low).  ESR was 64.  Thrombocytopenia has resolved.  He has a history of persistent hypomagnesemia and receives IV magnesium (2-4 gm) weekly.  He has not required magnesium since 10/24/2016.  Symptomatically, his diarrhea has improved dramatically and stool incontinence has resolved with the use of Lactaid.  C diff and GI panel by PCR was negative.  Exam is normal.  M spike is 0.  Plan:  1.  Labs today:  CBC with diff, CMP, Mg. 2.  Cycle #3 daratumumab today. 3.  Follow-up with GI as scheduled. 4.  RTC in 1  month for MD assessment, labs (CBC with diff, CMP, Mg, SPEP, free light chains), and cycle #4 daratumumab.   Lequita Asal, MD  02/19/2017, 9:58 AM

## 2017-02-28 ENCOUNTER — Other Ambulatory Visit: Payer: Self-pay | Admitting: Hematology and Oncology

## 2017-03-01 ENCOUNTER — Telehealth: Payer: Self-pay | Admitting: *Deleted

## 2017-03-01 ENCOUNTER — Other Ambulatory Visit: Payer: Self-pay | Admitting: *Deleted

## 2017-03-01 DIAGNOSIS — C9001 Multiple myeloma in remission: Secondary | ICD-10-CM

## 2017-03-01 MED ORDER — POTASSIUM CHLORIDE CRYS ER 20 MEQ PO TBCR: EXTENDED_RELEASE_TABLET | ORAL | 0 refills | Status: DC

## 2017-03-01 NOTE — Telephone Encounter (Signed)
Called and left patient a message that the potassium refill would be called in at one tablet a day as he indicated he was taking at last visit and to call back with questions.

## 2017-03-02 ENCOUNTER — Other Ambulatory Visit: Payer: Self-pay | Admitting: Hematology and Oncology

## 2017-03-04 ENCOUNTER — Other Ambulatory Visit: Payer: Self-pay | Admitting: Hematology and Oncology

## 2017-03-04 DIAGNOSIS — C9001 Multiple myeloma in remission: Secondary | ICD-10-CM

## 2017-03-05 DIAGNOSIS — Z7189 Other specified counseling: Secondary | ICD-10-CM | POA: Insufficient documentation

## 2017-03-13 ENCOUNTER — Encounter: Payer: Self-pay | Admitting: Gastroenterology

## 2017-03-13 ENCOUNTER — Ambulatory Visit (INDEPENDENT_AMBULATORY_CARE_PROVIDER_SITE_OTHER): Payer: Medicare Other | Admitting: Gastroenterology

## 2017-03-13 ENCOUNTER — Other Ambulatory Visit: Payer: Self-pay

## 2017-03-13 VITALS — BP 126/76 | HR 91 | Temp 97.8°F | Ht 67.0 in | Wt 151.8 lb

## 2017-03-13 DIAGNOSIS — R197 Diarrhea, unspecified: Secondary | ICD-10-CM

## 2017-03-13 NOTE — Progress Notes (Signed)
Gastroenterology Consultation  Referring Provider:     Lequita Asal, MD Primary Care Physician:  Patient, No Pcp Per Primary Gastroenterologist:  Dr. Allen Norris     Reason for Consultation:     Diarrhea        HPI:   Jaxiel Kines is a 71 y.o. y/o male referred for consultation & management of Diarrhea by Dr. Patient, No Pcp Per.  This patient comes in today with what he reports to be chronic diarrhea.  The patient used to have the diarrhea a few times a week but now states he is having diarrhea all the time.  The patient started to get concerned after he started losing weight.  The patient has gone through to bone marrow transplants for multiple myeloma.  He denies any rectal bleeding or black stools.  The patient also denies any abdominal pain.  He does believe that he has some lactose intolerance and he takes Lactaid pills whenever he drinks milk. The patient's last colonoscopy was in 2013 and showed him to have a tubular adenoma.  I'm now being asked to see the patient for worsening diarrhea.  Past Medical History:  Diagnosis Date  . Anxiety   . Atrial fibrillation ([place])   . BPH (benign prostatic hyperplasia)   . Complication of anesthesia    BAD HEADACHE NIGHT OF FIRST CATARACT  . Difficulty voiding   . Dysrhythmia    A FIB  . Elevated PSA   . GERD (gastroesophageal reflux disease)   . HLD (hyperlipidemia)   . HOH (hard of hearing)   . Hypertension   . Multiple myeloma ([place])   . Neuropathy   . Pain    BACK  . Palpitations   . Pneumonia   . Pulmonary embolism ([place])   . Stroke [facility])    TIA    Past Surgical History:  Procedure Laterality Date  . BACK SURGERY  1994  . CATARACT EXTRACTION W/PHACO Right 05/02/2016   Procedure: CATARACT EXTRACTION PHACO AND INTRAOCULAR LENS PLACEMENT (IOC);  Surgeon: Birder Robson, MD;  Location: [facility] ORS;  Service: Ophthalmology;  Laterality: Right;  [place] 1.06 AP% 20.6 CDE 13.70 FLUID PACK LOT # [id number]  . CATARACT  EXTRACTION W/PHACO Left 05/16/2016   Procedure: CATARACT EXTRACTION PHACO AND INTRAOCULAR LENS PLACEMENT (IOC);  Surgeon: Birder Robson, MD;  Location: [facility] ORS;  Service: Ophthalmology;  Laterality: Left;  [place] 01:43 AP% 19.8 CDE 20.45 FLUID PACK LOT #[id number] H  . KNEE ARTHROSCOPY Left 1992  . LIMBAL STEM CELL TRANSPLANT    . PAIN PUMP IMPLANTATION  2012  . PORTA CATH INSERTION N/A 12/04/2016   Procedure: Glori Luis Cath Insertion;  Surgeon: Algernon Huxley, MD;  Location: [facility];  Service: Cardiovascular;  Laterality: N/A;  . stem cell implant  2008   [facility]    Prior to Admission medications   Medication Sig Start Date End Date Taking? Authorizing Provider  acetaminophen (TYLENOL) 500 MG tablet Take 1,000 mg by mouth every 8 (eight) hours as needed for mild pain or headache.    Yes [provider]  ALPRAZolam (XANAX) 0.25 MG tablet TAKE 1 TABLET BY MOUTH EVERY NIGHT AT BEDTIME AS NEEDED FOR ANXIETY 02/18/17  Yes Corcoran, Drue Second, MD  baclofen (LIORESAL) 10 MG tablet Take 1 tablet (10 mg total) by mouth at bedtime. 12/18/16  Yes Milinda Pointer, MD  calcium citrate-vitamin D (CITRACAL+D) 315-200 MG-UNIT per tablet Take 1 tablet by mouth daily.    Yes [provider]  cetirizine (ZYRTEC) 10 MG tablet Take 10 mg by mouth daily as needed for allergies.    Yes [provider]  dexamethasone (DECADRON) 4 MG tablet Take 4 tabs PO 1 hr prior to infusion, then take 1 tab on day 2 and day 3 12/01/16  Yes Lequita Asal, MD  diltiazem (CARDIZEM CD) 120 MG 24 hr capsule Take by mouth. 11/30/16  Yes [provider]  diltiazem (CARDIZEM) 60 MG tablet Take 60 mg by mouth as needed (raised heart rate).    Yes [provider]  ELIQUIS 5 MG TABS tablet TAKE 1 TABLET(5 MG) BY MOUTH TWICE DAILY 03/05/17  Yes Corcoran, Melissa C, MD  ENSURE (ENSURE) Take 237 mLs by mouth daily.   Yes [provider]  fluticasone (FLONASE) 50 MCG/ACT nasal spray Place  1 spray into the nose daily as needed for allergies.    Yes [provider]  Iron-Vitamins (GERITOL PO) Take 1 tablet by mouth daily.    Yes [provider]  lidocaine-prilocaine (EMLA) cream Apply 1 application topically as needed. 01/04/17  Yes Corcoran, Drue Second, MD  loperamide (IMODIUM) 2 MG capsule Take 2-4 mg by mouth as needed for diarrhea or loose stools. Reported on 04/26/2016   Yes [provider]  lovastatin (MEVACOR) 20 MG tablet Take 20 mg by mouth every evening.  08/18/16  Yes [provider]  montelukast (SINGULAIR) 10 MG tablet TAKE 1 TABLET BY MOUTH ONCE A DAY AS DIRECTED. Patient taking differently: Take 5ms daily the day before the infusion, the day of, and the 2 days after infusion 05/22/16  Yes Corcoran, Melissa C, MD  naproxen sodium (ANAPROX) 220 MG tablet Take 220-440 mg by mouth 2 (two) times daily as needed (pain).   Yes [provider]  omeprazole (PRILOSEC) 20 MG capsule Take 20 mg by mouth daily.    Yes [provider]  potassium chloride SA (K-DUR,KLOR-CON) 20 MEQ tablet TAKE 1 TABLET(20 MEQ) BY MOUTH DAILY 03/01/17  Yes Corcoran, MDrue Second MD  prochlorperazine (COMPAZINE) 10 MG tablet Take 10 mg by mouth every 6 (six) hours as needed for nausea or vomiting.   Yes [provider]  ranitidine (ZANTAC) 150 MG tablet Take 150 mg by mouth at bedtime.    Yes [provider]  tamsulosin (FLOMAX) 0.4 MG CAPS capsule Take 1 capsule (0.4 mg total) by mouth daily. 05/01/16  Yes McGowan, SLarene BeachA, PA-C  valACYclovir (VALTREX) 500 MG tablet TAKE 1 TABLET BY MOUTH DAILY 02/08/17  Yes Corcoran, Melissa C, MD  diltiazem (DILACOR XR) 120 MG 24 hr capsule Take 120 mg by mouth daily.    [provider]  PAIN MANAGEMENT IT PUMP REFILL 1 each by Intrathecal route once. Medication: PF Fentanyl 1,500.0 mcg/ml PF Bupivicaine 30.0 mg/ml PF Clonidine 300.088m/ml Total Volume: 40 ml Needed by 01-02-17 @ 1000 11/28/16  11/29/16  NaMilinda PointerMD    Family History  Problem Relation Age of Onset  . Cancer Father        throat  . Kidney disease Sister   . Stroke Unknown   . Bladder Cancer Neg Hx   . Prostate cancer Neg Hx      Social History  Substance Use Topics  . Smoking status: Former Smoker    Packs/day: 2.50    Years: 12.00    Types: Cigarettes  . Smokeless tobacco: Former [employer] developer   Comment: Quit 40 years ago  . Alcohol use No    Allergies  as of 03/13/2017 - Review Complete 03/13/2017  Allergen Reaction Noted  . Azithromycin Diarrhea 11/10/2015  . Bee pollen  02/05/2017  . Pollen extract Other (See Comments) 02/05/2017  . Zometa [zoledronic acid] Other (See Comments) 04/26/2015  . Rivaroxaban Rash 04/26/2015    Review of Systems:    All systems reviewed and negative except where noted in HPI.   Physical Exam:  BP 126/76   Pulse 91   Temp 97.8 F (36.6 C) (Oral)   Ht '5\' 7"'  (1.702 m)   Wt 151 lb 12.8 oz (68.9 kg)   BMI 23.78 kg/m  No LMP for male patient. Psych:  Alert and cooperative. Normal mood and affect. General:   Alert,  Well-developed, well-nourished, pleasant and cooperative in NAD Head:  Normocephalic and atraumatic. Eyes:  Sclera clear, no icterus.   Conjunctiva pink. Ears:  Normal auditory acuity. Nose:  No deformity, discharge, or lesions. Mouth:  No deformity or lesions,oropharynx pink & moist. Neck:  Supple; no masses or thyromegaly. Lungs:  Respirations even and unlabored.  Clear throughout to auscultation.   No wheezes, crackles, or rhonchi. No acute distress. Heart:  Regular rate and rhythm; no murmurs, clicks, rubs, or gallops. Abdomen:  Normal bowel sounds.  No bruits.  Soft, non-tender and non-distended without masses, hepatosplenomegaly or hernias noted.  No guarding or rebound tenderness.  Negative Carnett sign.   Rectal:  Deferred.  Msk:  Symmetrical without gross deformities.  Good, equal movement & strength bilaterally. Pulses:  Normal pulses  noted. Extremities:  No clubbing or edema.  No cyanosis. Neurologic:  Alert and oriented x3;  grossly normal neurologically. Skin:  Intact without significant lesions or rashes.  No jaundice. Lymph Nodes:  No significant cervical adenopathy. Psych:  Alert and cooperative. Normal mood and affect.  Imaging Studies: No results found.  Assessment and Plan:   Kainoa Swoboda is a 72 y.o. y/o male who has had chronic diarrhea for years but it has become more frequent with weight loss.  The patient may be suffering from microscopic colitis versus possible bacterial overgrowth.  The patient will have his stool sent off for fecal leukocytes and fecal fat.  The patient will also be set up for colonoscopy. I have discussed risks & benefits which include, but are not limited to, bleeding, infection, perforation & drug reaction.  The patient agrees with this plan & written consent will be obtained.     Lucilla Lame, MD. Marval Regal   Note: This dictation was prepared with Dragon dictation along with smaller phrase technology. Any transcriptional errors that result from this process are unintentional.

## 2017-03-14 ENCOUNTER — Other Ambulatory Visit: Payer: Self-pay

## 2017-03-14 ENCOUNTER — Telehealth: Payer: Self-pay | Admitting: Gastroenterology

## 2017-03-14 NOTE — Telephone Encounter (Signed)
Patient has a colonoscopy schedule 6/14 and is currently on Eliquis.  He needs advise on how/when to stop/start. Please call pt

## 2017-03-14 NOTE — Telephone Encounter (Signed)
Left vm letting pt know a blood thinner clearance has been sent to Dr Nehemiah Massed. I will contact pt when I receive notification of when to stop Eliquis.

## 2017-03-15 ENCOUNTER — Telehealth: Payer: Self-pay | Admitting: *Deleted

## 2017-03-15 ENCOUNTER — Encounter: Payer: Self-pay | Admitting: *Deleted

## 2017-03-15 NOTE — Telephone Encounter (Signed)
Patient states that he is having a colonoscopy on 6/14 and was told in pre op to inform Dr Humberto Seals in case she wants to do something in preparation for it. Please advise

## 2017-03-16 ENCOUNTER — Telehealth: Payer: Self-pay

## 2017-03-16 ENCOUNTER — Other Ambulatory Visit: Payer: Self-pay | Admitting: *Deleted

## 2017-03-16 ENCOUNTER — Other Ambulatory Visit: Payer: Self-pay | Admitting: Hematology and Oncology

## 2017-03-16 DIAGNOSIS — C9001 Multiple myeloma in remission: Secondary | ICD-10-CM

## 2017-03-16 NOTE — Telephone Encounter (Signed)
Left vm letting pt know I have received blood thinner request back from Dr. Nehemiah Massed. Per request, he is to stop Eliquis 5mg  3 days prior to colonoscopy and restart 1 day after. Requested a return call from pt to let me know he received my instructions.

## 2017-03-16 NOTE — Telephone Encounter (Signed)
Called patient and spoke with wife that the patient would require a cbc prior to colonoscopy and that we would call with appt time, voiced understanding.

## 2017-03-16 NOTE — Telephone Encounter (Signed)
I will get this ordered and call patient

## 2017-03-16 NOTE — Telephone Encounter (Signed)
  He should have a CBC with diff before to ensure counts are ok.  M

## 2017-03-21 ENCOUNTER — Inpatient Hospital Stay: Payer: Medicare Other | Attending: Hematology and Oncology

## 2017-03-21 ENCOUNTER — Other Ambulatory Visit: Payer: Self-pay | Admitting: *Deleted

## 2017-03-21 DIAGNOSIS — K5790 Diverticulosis of intestine, part unspecified, without perforation or abscess without bleeding: Secondary | ICD-10-CM | POA: Insufficient documentation

## 2017-03-21 DIAGNOSIS — I4891 Unspecified atrial fibrillation: Secondary | ICD-10-CM | POA: Insufficient documentation

## 2017-03-21 DIAGNOSIS — R197 Diarrhea, unspecified: Secondary | ICD-10-CM | POA: Insufficient documentation

## 2017-03-21 DIAGNOSIS — H9190 Unspecified hearing loss, unspecified ear: Secondary | ICD-10-CM | POA: Insufficient documentation

## 2017-03-21 DIAGNOSIS — I10 Essential (primary) hypertension: Secondary | ICD-10-CM | POA: Insufficient documentation

## 2017-03-21 DIAGNOSIS — G8929 Other chronic pain: Secondary | ICD-10-CM | POA: Diagnosis not present

## 2017-03-21 DIAGNOSIS — Z79899 Other long term (current) drug therapy: Secondary | ICD-10-CM | POA: Insufficient documentation

## 2017-03-21 DIAGNOSIS — E785 Hyperlipidemia, unspecified: Secondary | ICD-10-CM | POA: Diagnosis not present

## 2017-03-21 DIAGNOSIS — Z7901 Long term (current) use of anticoagulants: Secondary | ICD-10-CM | POA: Diagnosis not present

## 2017-03-21 DIAGNOSIS — F419 Anxiety disorder, unspecified: Secondary | ICD-10-CM | POA: Insufficient documentation

## 2017-03-21 DIAGNOSIS — Z9484 Stem cells transplant status: Secondary | ICD-10-CM | POA: Diagnosis not present

## 2017-03-21 DIAGNOSIS — K219 Gastro-esophageal reflux disease without esophagitis: Secondary | ICD-10-CM | POA: Diagnosis not present

## 2017-03-21 DIAGNOSIS — Z8701 Personal history of pneumonia (recurrent): Secondary | ICD-10-CM | POA: Diagnosis not present

## 2017-03-21 DIAGNOSIS — N419 Inflammatory disease of prostate, unspecified: Secondary | ICD-10-CM | POA: Diagnosis not present

## 2017-03-21 DIAGNOSIS — C9002 Multiple myeloma in relapse: Secondary | ICD-10-CM | POA: Diagnosis not present

## 2017-03-21 DIAGNOSIS — Z8739 Personal history of other diseases of the musculoskeletal system and connective tissue: Secondary | ICD-10-CM | POA: Diagnosis not present

## 2017-03-21 DIAGNOSIS — R439 Unspecified disturbances of smell and taste: Secondary | ICD-10-CM | POA: Insufficient documentation

## 2017-03-21 DIAGNOSIS — C9000 Multiple myeloma not having achieved remission: Secondary | ICD-10-CM

## 2017-03-21 DIAGNOSIS — Z978 Presence of other specified devices: Secondary | ICD-10-CM | POA: Diagnosis not present

## 2017-03-21 DIAGNOSIS — K529 Noninfective gastroenteritis and colitis, unspecified: Secondary | ICD-10-CM | POA: Insufficient documentation

## 2017-03-21 DIAGNOSIS — D638 Anemia in other chronic diseases classified elsewhere: Secondary | ICD-10-CM | POA: Diagnosis not present

## 2017-03-21 DIAGNOSIS — Z8673 Personal history of transient ischemic attack (TIA), and cerebral infarction without residual deficits: Secondary | ICD-10-CM | POA: Insufficient documentation

## 2017-03-21 DIAGNOSIS — C9001 Multiple myeloma in remission: Secondary | ICD-10-CM

## 2017-03-21 DIAGNOSIS — Z5112 Encounter for antineoplastic immunotherapy: Secondary | ICD-10-CM | POA: Diagnosis not present

## 2017-03-21 DIAGNOSIS — R1011 Right upper quadrant pain: Secondary | ICD-10-CM | POA: Diagnosis not present

## 2017-03-21 DIAGNOSIS — Z87891 Personal history of nicotine dependence: Secondary | ICD-10-CM | POA: Insufficient documentation

## 2017-03-21 DIAGNOSIS — M549 Dorsalgia, unspecified: Secondary | ICD-10-CM | POA: Diagnosis not present

## 2017-03-21 DIAGNOSIS — Z800 Family history of malignant neoplasm of digestive organs: Secondary | ICD-10-CM | POA: Insufficient documentation

## 2017-03-21 DIAGNOSIS — Z86711 Personal history of pulmonary embolism: Secondary | ICD-10-CM | POA: Insufficient documentation

## 2017-03-21 LAB — COMPREHENSIVE METABOLIC PANEL
ALT: 16 U/L — ABNORMAL LOW (ref 17–63)
AST: 23 U/L (ref 15–41)
Albumin: 4.1 g/dL (ref 3.5–5.0)
Alkaline Phosphatase: 57 U/L (ref 38–126)
Anion gap: 8 (ref 5–15)
BUN: 20 mg/dL (ref 6–20)
CO2: 26 mmol/L (ref 22–32)
Calcium: 9.3 mg/dL (ref 8.9–10.3)
Chloride: 104 mmol/L (ref 101–111)
Creatinine, Ser: 1.34 mg/dL — ABNORMAL HIGH (ref 0.61–1.24)
GFR calc Af Amer: >60 mL/min (ref >60)
GFR calc non Af Amer: 52 mL/min — ABNORMAL LOW (ref >60)
Glucose, Bld: 110 mg/dL — ABNORMAL HIGH (ref 65–99)
Potassium: 3.9 mmol/L (ref 3.5–5.1)
Sodium: 138 mmol/L (ref 135–145)
Total Bilirubin: 0.7 mg/dL (ref 0.3–1.2)
Total Protein: 6.6 g/dL (ref 6.5–8.1)

## 2017-03-21 LAB — CBC WITH DIFFERENTIAL/PLATELET
Basophils Absolute: 0 10*3/uL (ref 0–0.1)
Basophils Relative: 1 %
Eosinophils Absolute: 0.2 10*3/uL (ref 0–0.7)
Eosinophils Relative: 4 %
HCT: 37.5 % — ABNORMAL LOW (ref 40.0–52.0)
Hemoglobin: 13.4 g/dL (ref 13.0–18.0)
Lymphocytes Relative: 20 %
Lymphs Abs: 1 10*3/uL (ref 1.0–3.6)
MCH: 36.6 pg — ABNORMAL HIGH (ref 26.0–34.0)
MCHC: 35.7 g/dL (ref 32.0–36.0)
MCV: 102.5 fL — ABNORMAL HIGH (ref 80.0–100.0)
Monocytes Absolute: 0.4 10*3/uL (ref 0.2–1.0)
Monocytes Relative: 9 %
Neutro Abs: 3.2 10*3/uL (ref 1.4–6.5)
Neutrophils Relative %: 66 %
Platelets: 141 10*3/uL — ABNORMAL LOW (ref 150–440)
RBC: 3.66 MIL/uL — ABNORMAL LOW (ref 4.40–5.90)
RDW: 13.9 % (ref 11.5–14.5)
WBC: 4.8 10*3/uL (ref 3.8–10.6)

## 2017-03-21 LAB — MAGNESIUM: Magnesium: 1.9 mg/dL (ref 1.7–2.4)

## 2017-03-21 MED ORDER — ALPRAZOLAM 0.25 MG PO TABS: ORAL_TABLET | ORAL | 0 refills | Status: DC

## 2017-03-21 NOTE — Discharge Instructions (Signed)
General Anesthesia, Adult, Care After °These instructions provide you with information about caring for yourself after your procedure. Your health care provider may also give you more specific instructions. Your treatment has been planned according to current medical practices, but problems sometimes occur. Call your health care provider if you have any problems or questions after your procedure. °What can I expect after the procedure? °After the procedure, it is common to have: °· Vomiting. °· A sore throat. °· Mental slowness. ° °It is common to feel: °· Nauseous. °· Cold or shivery. °· Sleepy. °· Tired. °· Sore or achy, even in parts of your body where you did not have surgery. ° °Follow these instructions at home: °For at least 24 hours after the procedure: °· Do not: °? Participate in activities where you could fall or become injured. °? Drive. °? Use heavy machinery. °? Drink alcohol. °? Take sleeping pills or medicines that cause drowsiness. °? Make important decisions or sign legal documents. °? Take care of children on your own. °· Rest. °Eating and drinking °· If you vomit, drink water, juice, or soup when you can drink without vomiting. °· Drink enough fluid to keep your urine clear or pale yellow. °· Make sure you have little or no nausea before eating solid foods. °· Follow the diet recommended by your health care provider. °General instructions °· Have a responsible adult stay with you until you are awake and alert. °· Return to your normal activities as told by your health care provider. Ask your health care provider what activities are safe for you. °· Take over-the-counter and prescription medicines only as told by your health care provider. °· If you smoke, do not smoke without supervision. °· Keep all follow-up visits as told by your health care provider. This is important. °Contact a health care provider if: °· You continue to have nausea or vomiting at home, and medicines are not helpful. °· You  cannot drink fluids or start eating again. °· You cannot urinate after 8-12 hours. °· You develop a skin rash. °· You have fever. °· You have increasing redness at the site of your procedure. °Get help right away if: °· You have difficulty breathing. °· You have chest pain. °· You have unexpected bleeding. °· You feel that you are having a life-threatening or urgent problem. °This information is not intended to replace advice given to you by your health care provider. Make sure you discuss any questions you have with your health care provider. °Document Released: 01/01/2001 Document Revised: 02/28/2016 Document Reviewed: 09/09/2015 °Elsevier Interactive Patient Education © 2018 Else[employer] Inc[employer] ° °

## 2017-03-22 ENCOUNTER — Ambulatory Visit: Payer: Medicare Other | Admitting: Hematology and Oncology

## 2017-03-22 ENCOUNTER — Other Ambulatory Visit: Payer: Self-pay

## 2017-03-22 ENCOUNTER — Other Ambulatory Visit: Payer: Medicare Other

## 2017-03-22 ENCOUNTER — Ambulatory Visit: Payer: Medicare Other | Admitting: Anesthesiology

## 2017-03-22 ENCOUNTER — Ambulatory Visit
Admission: RE | Admit: 2017-03-22 | Discharge: 2017-03-22 | Disposition: A | Discharge status: Home / Self Care | Payer: Medicare Other | Source: Ambulatory Visit | Attending: Gastroenterology | Admitting: Gastroenterology

## 2017-03-22 ENCOUNTER — Encounter
Admission: RE | Disposition: A | Discharge status: Home / Self Care | Payer: Self-pay | Source: Ambulatory Visit | Attending: Gastroenterology

## 2017-03-22 ENCOUNTER — Ambulatory Visit: Payer: Medicare Other

## 2017-03-22 DIAGNOSIS — K219 Gastro-esophageal reflux disease without esophagitis: Secondary | ICD-10-CM

## 2017-03-22 DIAGNOSIS — E785 Hyperlipidemia, unspecified: Secondary | ICD-10-CM | POA: Insufficient documentation

## 2017-03-22 DIAGNOSIS — Z9484 Stem cells transplant status: Secondary | ICD-10-CM | POA: Diagnosis not present

## 2017-03-22 DIAGNOSIS — Z79899 Other long term (current) drug therapy: Secondary | ICD-10-CM | POA: Insufficient documentation

## 2017-03-22 DIAGNOSIS — I4891 Unspecified atrial fibrillation: Secondary | ICD-10-CM | POA: Diagnosis not present

## 2017-03-22 DIAGNOSIS — Z8673 Personal history of transient ischemic attack (TIA), and cerebral infarction without residual deficits: Secondary | ICD-10-CM | POA: Diagnosis not present

## 2017-03-22 DIAGNOSIS — Z9221 Personal history of antineoplastic chemotherapy: Secondary | ICD-10-CM | POA: Diagnosis not present

## 2017-03-22 DIAGNOSIS — I10 Essential (primary) hypertension: Secondary | ICD-10-CM | POA: Diagnosis not present

## 2017-03-22 DIAGNOSIS — K5730 Diverticulosis of large intestine without perforation or abscess without bleeding: Secondary | ICD-10-CM | POA: Insufficient documentation

## 2017-03-22 DIAGNOSIS — K6389 Other specified diseases of intestine: Secondary | ICD-10-CM | POA: Insufficient documentation

## 2017-03-22 DIAGNOSIS — G629 Polyneuropathy, unspecified: Secondary | ICD-10-CM | POA: Diagnosis not present

## 2017-03-22 DIAGNOSIS — Z86711 Personal history of pulmonary embolism: Secondary | ICD-10-CM | POA: Insufficient documentation

## 2017-03-22 DIAGNOSIS — N400 Enlarged prostate without lower urinary tract symptoms: Secondary | ICD-10-CM | POA: Diagnosis not present

## 2017-03-22 DIAGNOSIS — Z7901 Long term (current) use of anticoagulants: Secondary | ICD-10-CM | POA: Insufficient documentation

## 2017-03-22 DIAGNOSIS — R197 Diarrhea, unspecified: Secondary | ICD-10-CM | POA: Diagnosis not present

## 2017-03-22 DIAGNOSIS — I480 Paroxysmal atrial fibrillation: Secondary | ICD-10-CM | POA: Diagnosis not present

## 2017-03-22 DIAGNOSIS — F419 Anxiety disorder, unspecified: Secondary | ICD-10-CM | POA: Diagnosis not present

## 2017-03-22 DIAGNOSIS — K529 Noninfective gastroenteritis and colitis, unspecified: Secondary | ICD-10-CM | POA: Insufficient documentation

## 2017-03-22 DIAGNOSIS — R12 Heartburn: Secondary | ICD-10-CM | POA: Diagnosis present

## 2017-03-22 DIAGNOSIS — C9000 Multiple myeloma not having achieved remission: Secondary | ICD-10-CM | POA: Diagnosis not present

## 2017-03-22 DIAGNOSIS — Z87891 Personal history of nicotine dependence: Secondary | ICD-10-CM | POA: Diagnosis not present

## 2017-03-22 HISTORY — PX: COLONOSCOPY WITH PROPOFOL: SHX5780

## 2017-03-22 HISTORY — DX: Wedge compression fracture of unspecified lumbar vertebra, initial encounter for closed fracture: S32.000A

## 2017-03-22 HISTORY — PX: ESOPHAGOGASTRODUODENOSCOPY (EGD) WITH PROPOFOL: SHX5813

## 2017-03-22 HISTORY — DX: Presence of external hearing-aid: Z97.4

## 2017-03-22 LAB — PROTEIN ELECTROPHORESIS, SERUM
A/G Ratio: 1.7 (ref 0.7–1.7)
Albumin ELP: 3.8 g/dL (ref 2.9–4.4)
Alpha-1-Globulin: 0.2 g/dL (ref 0.0–0.4)
Alpha-2-Globulin: 0.9 g/dL (ref 0.4–1.0)
Beta Globulin: 0.9 g/dL (ref 0.7–1.3)
Gamma Globulin: 0.3 g/dL — ABNORMAL LOW (ref 0.4–1.8)
Globulin, Total: 2.3 g/dL (ref 2.2–3.9)
Total Protein ELP: 6.1 g/dL (ref 6.0–8.5)

## 2017-03-22 LAB — KAPPA/LAMBDA LIGHT CHAINS
Kappa free light chain: 7.8 mg/L (ref 3.3–19.4)
Kappa, lambda light chain ratio: 1.01 (ref 0.26–1.65)
Lambda free light chains: 7.7 mg/L (ref 5.7–26.3)

## 2017-03-22 SURGERY — COLONOSCOPY WITH PROPOFOL
Anesthesia: General | Wound class: Contaminated

## 2017-03-22 MED ORDER — PAIN MANAGEMENT IT PUMP REFILL: 1.0000 | Freq: Once | INTRATHECAL | 0 refills | Status: DC

## 2017-03-22 MED ORDER — LIDOCAINE HCL (CARDIAC) 20 MG/ML IV SOLN
INTRAVENOUS | Status: DC | PRN
  Administered 2017-03-22: 40 mg via INTRAVENOUS

## 2017-03-22 MED ORDER — LACTATED RINGERS IV SOLN
INTRAVENOUS | Status: DC
  Administered 2017-03-22: 09:00:00 via INTRAVENOUS

## 2017-03-22 MED ORDER — STERILE WATER FOR IRRIGATION IR SOLN
Status: DC | PRN
  Administered 2017-03-22: 10:00:00

## 2017-03-22 MED ORDER — PROPOFOL 10 MG/ML IV BOLUS
INTRAVENOUS | Status: DC | PRN
  Administered 2017-03-22: 10 mg via INTRAVENOUS
  Administered 2017-03-22: 70 mg via INTRAVENOUS
  Administered 2017-03-22 (×2): 10 mg via INTRAVENOUS
  Administered 2017-03-22: 20 mg via INTRAVENOUS
  Administered 2017-03-22: 10 mg via INTRAVENOUS
  Administered 2017-03-22: 20 mg via INTRAVENOUS
  Administered 2017-03-22 (×2): 10 mg via INTRAVENOUS

## 2017-03-22 MED ORDER — GLYCOPYRROLATE 0.2 MG/ML IJ SOLN
INTRAMUSCULAR | Status: DC | PRN
  Administered 2017-03-22: 0.1 mg via INTRAVENOUS

## 2017-03-22 SURGICAL SUPPLY — 23 items
CANISTER SUCT 1200ML W/VALVE (MISCELLANEOUS) ×4 IMPLANT
CLIP HMST 235XBRD CATH ROT (MISCELLANEOUS) IMPLANT
CLIP RESOLUTION 360 11X235 (MISCELLANEOUS)
FCP ESCP3.2XJMB 240X2.8X (MISCELLANEOUS)
FORCEPS BIOP RAD 4 LRG CAP 4 (CUTTING FORCEPS) ×4 IMPLANT
FORCEPS BIOP RJ4 240 W/NDL (MISCELLANEOUS)
FORCEPS ESCP3.2XJMB 240X2.8X (MISCELLANEOUS) IMPLANT
GOWN CVR UNV OPN BCK APRN NK (MISCELLANEOUS) ×4 IMPLANT
GOWN ISOL THUMB LOOP REG UNIV (MISCELLANEOUS) ×4
INJECTOR VARIJECT VIN23 (MISCELLANEOUS) IMPLANT
KIT DEFENDO VALVE AND CONN (KITS) IMPLANT
KIT ENDO PROCEDURE OLY (KITS) ×4 IMPLANT
MARKER SPOT ENDO TATTOO 5ML (MISCELLANEOUS) IMPLANT
PAD GROUND ADULT SPLIT (MISCELLANEOUS) IMPLANT
PROBE APC STR FIRE (PROBE) IMPLANT
RETRIEVER NET ROTH 2.5X230 LF (MISCELLANEOUS) IMPLANT
SNARE SHORT THROW 13M SML OVAL (MISCELLANEOUS) IMPLANT
SNARE SHORT THROW 30M LRG OVAL (MISCELLANEOUS) IMPLANT
SNARE SNG USE RND 15MM (INSTRUMENTS) IMPLANT
SPOT EX ENDOSCOPIC TATTOO (MISCELLANEOUS)
TRAP ETRAP POLY (MISCELLANEOUS) IMPLANT
VARIJECT INJECTOR VIN23 (MISCELLANEOUS)
WATER STERILE IRR 250ML POUR (IV SOLUTION) ×4 IMPLANT

## 2017-03-22 NOTE — Op Note (Signed)
[facility] Gastroenterology Patient Name: Logan Perez Procedure Date: 03/22/2017 10:03 AM MRN: [id number] Account #: [id number] Date of Birth: 13-Jun-1946 Admit Type: Outpatient Age: 71 Room: [facility] OR ROOM 1 Gender: Male Note Status: Finalized Procedure:            Upper GI endoscopy Indications:          Heartburn, Diarrhea Providers:            Lucilla Lame MD, MD Referring MD:         Drue Second. Corcoran (Referring MD) Medicines:            Propofol per Anesthesia Complications:        No immediate complications. Procedure:            Pre-Anesthesia Assessment:                       - Prior to the procedure, a History and Physical was                        performed, and patient medications and allergies were                        reviewed. The patient's tolerance of previous                        anesthesia was also reviewed. The risks and benefits of                        the procedure and the sedation options and risks were                        discussed with the patient. All questions were                        answered, and informed consent was obtained. Prior                        Anticoagulants: The patient has taken no previous                        anticoagulant or antiplatelet agents. ASA Grade                        Assessment: II - A patient with mild systemic disease.                        After reviewing the risks and benefits, the patient was                        deemed in satisfactory condition to undergo the                        procedure.                       After obtaining informed consent, the endoscope was                        passed under direct vision. Throughout the procedure,  the patient's blood pressure, pulse, and oxygen                        saturations were monitored continuously. The Olympus                        190 Endoscope [id number]) was introduced through the    mouth, and advanced to the second part of duodenum. The                        upper GI endoscopy was accomplished without difficulty.                        The patient tolerated the procedure well. Findings:      The examined esophagus was normal.      The stomach was normal.      The examined duodenum was normal. Biopsies were taken with a cold       forceps for histology. Impression:           - Normal esophagus.                       - Normal stomach.                       - Normal examined duodenum. Biopsied. Recommendation:       - Discharge patient to home.                       - Resume previous diet.                       - Continue present medications. Procedure Code(s):    --- Professional ---                       [phone], Esophagogastroduodenoscopy, flexible, transoral;                        with biopsy, single or multiple Diagnosis Code(s):    --- Professional ---                       R12, Heartburn                       R19.7, Diarrhea, unspecified CPT copyright 2016 American Medical Association. All rights reserved. The codes documented in this report are preliminary and upon coder review may  be revised to meet current compliance requirements. Lucilla Lame MD, MD 03/22/2017 10:14:28 AM This report has been signed electronically. Number of Addenda: 0 Note Initiated On: 03/22/2017 10:03 AM      [facility]

## 2017-03-22 NOTE — Op Note (Signed)
[facility] Gastroenterology Patient Name: Logan Perez Procedure Date: 03/22/2017 10:04 AM MRN: [id number] Account #: [id number] Date of Birth: July 21, 1946 Admit Type: Outpatient Age: 71 Room: [facility] OR ROOM 01 Gender: Male Note Status: Finalized Procedure:            Colonoscopy Indications:          Chronic diarrhea Providers:            Lucilla Lame MD, MD Referring MD:         Drue Second. Corcoran (Referring MD) Medicines:            Propofol per Anesthesia Complications:        No immediate complications. Procedure:            Pre-Anesthesia Assessment:                       - Prior to the procedure, a History and Physical was                        performed, and patient medications and allergies were                        reviewed. The patient's tolerance of previous                        anesthesia was also reviewed. The risks and benefits of                        the procedure and the sedation options and risks were                        discussed with the patient. All questions were                        answered, and informed consent was obtained. Prior                        Anticoagulants: The patient has taken no previous                        anticoagulant or antiplatelet agents. ASA Grade                        Assessment: II - A patient with mild systemic disease.                        After reviewing the risks and benefits, the patient was                        deemed in satisfactory condition to undergo the                        procedure.                       After obtaining informed consent, the colonoscope was                        passed under direct vision. Throughout the procedure,  the patient's blood pressure, pulse, and oxygen                        saturations were monitored continuously. The Olympus                        Colonoscope 190 [id number]) was introduced through the   anus and advanced to the the terminal ileum. The                        colonoscopy was performed without difficulty. The                        patient tolerated the procedure well. The quality of                        the bowel preparation was excellent. Findings:      The perianal and digital rectal examinations were normal.      The terminal ileum appeared normal. Biopsies were taken with a cold       forceps for histology.      Many small-mouthed diverticula were found in the entire colon.      Random biopsies were obtained with cold forceps for histology randomly       in the entire colon. Impression:           - The examined portion of the ileum was normal.                        Biopsied.                       - Diverticulosis in the entire examined colon.                       - Random biopsies were obtained in the entire colon. Recommendation:       - Discharge patient to home.                       - Resume previous diet.                       - Continue present medications.                       - Await pathology results. Procedure Code(s):    --- Professional ---                       [id number], Colonoscopy, flexible; with biopsy, single or                        multiple Diagnosis Code(s):    --- Professional ---                       K52.9, Noninfective gastroenteritis and colitis,                        unspecified CPT copyright 2016 American Medical Association. All rights reserved. The codes documented in this report are preliminary and upon coder review may  be revised to meet current compliance requirements. Lucilla Lame MD, MD 03/22/2017 10:29:49 AM This report has been signed electronically.  Number of Addenda: 0 Note Initiated On: 03/22/2017 10:04 AM Scope Withdrawal Time: 0 hours 8 minutes 4 seconds  Total Procedure Duration: 0 hours 10 minutes 32 seconds       [facility]

## 2017-03-22 NOTE — Transfer of Care (Signed)
Immediate Anesthesia Transfer of Care Note  Patient: Logan Perez  Procedure(s) Performed: Procedure(s) with comments: COLONOSCOPY WITH PROPOFOL (N/A) - has port ESOPHAGOGASTRODUODENOSCOPY (EGD) WITH PROPOFOL  Patient Location: PACU  Anesthesia Type: General  Level of Consciousness: awake, alert  and patient cooperative  Airway and Oxygen Therapy: Patient Spontanous Breathing and Patient connected to supplemental oxygen  Post-op Assessment: Post-op Vital signs reviewed, Patient's Cardiovascular Status Stable, Respiratory Function Stable, Patent Airway and No signs of Nausea or vomiting  Post-op Vital Signs: Reviewed and stable  Complications: No apparent anesthesia complications

## 2017-03-22 NOTE — Anesthesia Preprocedure Evaluation (Addendum)
Anesthesia Evaluation  Patient identified by MRN, date of birth, ID band  Reviewed: NPO status   History of Anesthesia Complications Negative for: history of anesthetic complications  Airway Mallampati: II  TM Distance: >3 FB Neck ROM: full    Dental no notable dental hx.    Pulmonary former smoker, PE (2014)   Pulmonary exam normal        Cardiovascular Exercise Tolerance: Good hypertension, Normal cardiovascular exam+ dysrhythmias ( parox. afib;) Atrial Fibrillation   cardiac muga: 2017: Mildly decreased LEFT ventricular ejection fraction of 46%. No focal LV wall motion abnormalities.;  cards stable: 03/2017: dr Nehemiah Massed;  last eliquis: 6/10;   Neuro/Psych Anxiety R belly pain pump for chronic back pain.   Neuropathy;  TIA (possible )No Residual Symptoms    GI/Hepatic Neg liver ROS, GERD  Controlled,  Endo/Other  negative endocrine ROS  Renal/GU negative Renal ROS  negative genitourinary   Musculoskeletal Multiple spince compression fractures   Abdominal   Peds  Hematology negative hematology ROS (+) mutiple myeloma status post autologous stem cell transplant; on chemotx;   Anesthesia Other Findings Tiva.  R chest port accessed by cancer center staff.  Reproductive/Obstetrics                            Anesthesia Physical Anesthesia Plan  ASA: III  Anesthesia Plan: General   Post-op Pain Management:    Induction:   PONV Risk Score and Plan:   Airway Management Planned:   Additional Equipment:   Intra-op Plan:   Post-operative Plan:   Informed Consent: I have reviewed the patients History and Physical, chart, labs and discussed the procedure including the risks, benefits and alternatives for the proposed anesthesia with the patient or authorized representative who has indicated his/her understanding and acceptance.     Plan Discussed with: CRNA  Anesthesia Plan  Comments:         Anesthesia Quick Evaluation

## 2017-03-22 NOTE — Anesthesia Procedure Notes (Signed)
Performed by: Amair Shrout Pre-anesthesia Checklist: Patient identified, Emergency Drugs available, Suction available, Timeout performed and Patient being monitored Patient Re-evaluated:Patient Re-evaluated prior to induction Oxygen Delivery Method: Nasal cannula Placement Confirmation: positive ETCO2       

## 2017-03-22 NOTE — H&P (Signed)
Lucilla Lame, MD [address] [place], [place] [address] [phone] Fax : [phone]  Primary Care Physician:  Patient, No Pcp Per Primary Gastroenterologist:  Dr. Allen Norris  Pre-Procedure History & Physical: HPI:  Logan Perez is a 71 y.o. male is here for an endoscopy and colonoscopy.   Past Medical History:  Diagnosis Date  . Anxiety   . Atrial fibrillation ([place])   . BPH (benign prostatic hyperplasia)   . Complication of anesthesia    BAD HEADACHE NIGHT OF FIRST CATARACT  . Compression fracture of lumbar vertebra ([place])   . Difficulty voiding   . Dysrhythmia    A FIB  . Elevated PSA   . GERD (gastroesophageal reflux disease)   . Hearing aid worn    bilateral  . HLD (hyperlipidemia)   . HOH (hard of hearing)   . Hypertension   . Multiple myeloma ([place])   . Neuropathy    feet. 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  . Pain    BACK  . Palpitations   . Pneumonia   . Pulmonary embolism ([place])   . Stroke [facility])    TIA, detected on CT scan. pt was unaware    Past Surgical History:  Procedure Laterality Date  . BACK SURGERY  1994  . CATARACT EXTRACTION W/PHACO Right 05/02/2016   Procedure: CATARACT EXTRACTION PHACO AND INTRAOCULAR LENS PLACEMENT (IOC);  Surgeon: Birder Robson, MD;  Location: [facility] ORS;  Service: Ophthalmology;  Laterality: Right;  [place] 1.06 AP% 20.6 CDE 13.70 FLUID PACK LOT # [id number] H  . CATARACT EXTRACTION W/PHACO Left 05/16/2016   Procedure: CATARACT EXTRACTION PHACO AND INTRAOCULAR LENS PLACEMENT (IOC);  Surgeon: Birder Robson, MD;  Location: [facility] ORS;  Service: Ophthalmology;  Laterality: Left;  [place] 01:43 AP% 19.8 CDE 20.45 FLUID PACK LOT #[id number] H  . KNEE ARTHROSCOPY Left 1992  . LIMBAL STEM CELL TRANSPLANT    . PAIN PUMP IMPLANTATION  2012  . PORTA CATH INSERTION N/A 12/04/2016   Procedure: Glori Luis Cath Insertion;  Surgeon: Algernon Huxley, MD;  Location: [facility];  Service: Cardiovascular;  Laterality: N/A;  . stem cell  implant  2008   [facility]    Prior to Admission medications   Medication Sig Start Date End Date Taking? Authorizing Provider  ALPRAZolam (XANAX) 0.25 MG tablet TAKE 1 TABLET BY MOUTH EVERY AT BEDTIME AS NEEDED FOR ANXIETY 03/21/17  Yes Corcoran, Drue Second, MD  baclofen (LIORESAL) 10 MG tablet Take 1 tablet (10 mg total) by mouth at bedtime. 12/18/16  Yes Milinda Pointer, MD  calcium citrate-vitamin D (CITRACAL+D) 315-200 MG-UNIT per tablet Take 1 tablet by mouth daily.    Yes [provider]  cetirizine (ZYRTEC) 10 MG tablet Take 10 mg by mouth daily as needed for allergies.    Yes [provider]  Cholecalciferol (VITAMIN D3 PO) Take by mouth daily.   Yes [provider]  Cyanocobalamin (VITAMIN B-12 PO) Take by mouth daily.   Yes [provider]  Daratumumab (DARZALEX IV) Inject into the vein.   Yes [provider]  dexamethasone (DECADRON) 4 MG tablet Take 4 tabs PO 1 hr prior to infusion, then take 1 tab on day 2 and day 3 12/01/16  Yes Lequita Asal, MD  diltiazem (CARDIZEM CD) 120 MG 24 hr capsule Take by mouth. 11/30/16  Yes [provider]  diltiazem (CARDIZEM) 60 MG tablet Take 60 mg by mouth as needed (raised heart rate).    Yes [provider]  ELIQUIS 5 MG TABS tablet TAKE 1 TABLET(5 MG) BY MOUTH TWICE DAILY 03/05/17  Yes Corcoran, Melissa C, MD  ENSURE (ENSURE) Take 237 mLs by mouth daily.   Yes [provider]  fluticasone (FLONASE) 50 MCG/ACT nasal spray Place 1 spray into the nose daily as needed for allergies.    Yes [provider]  Iron-Vitamins (GERITOL PO) Take 1 tablet by mouth daily.    Yes [provider]  lidocaine-prilocaine (EMLA) cream Apply 1 application topically as needed. 01/04/17  Yes Corcoran, Drue Second, MD  loperamide (IMODIUM) 2 MG capsule Take 2-4 mg by mouth as needed for diarrhea or loose stools. Reported on 04/26/2016   Yes [provider]  lovastatin (MEVACOR)  20 MG tablet Take 20 mg by mouth every evening.  08/18/16  Yes [provider]  montelukast (SINGULAIR) 10 MG tablet TAKE 1 TABLET BY MOUTH ONCE A DAY AS DIRECTED. Patient taking differently: Take 71ms daily the day before the infusion, the day of, and the 2 days after infusion 05/22/16  Yes Corcoran, Melissa C, MD  naproxen sodium (ANAPROX) 220 MG tablet Take 220-440 mg by mouth 2 (two) times daily as needed (pain).   Yes [provider]  omeprazole (PRILOSEC) 20 MG capsule Take 20 mg by mouth daily.    Yes [provider]  PAIN MANAGEMENT IT PUMP REFILL 1 each by Intrathecal route once. Medication: PF Fentanyl 1,500.0 mcg/ml PF Bupivicaine 30.0 mg/ml PF Clonidine 300.012m/ml Total Volume: 40 ml Needed by 01-02-17 @ 1000 11/28/16 03/15/17 Yes NaMilinda PointerMD  potassium chloride SA (K-DUR,KLOR-CON) 20 MEQ tablet TAKE 1 TABLET(20 MEQ) BY MOUTH DAILY 03/01/17  Yes Corcoran, MeDrue SecondMD  ranitidine (ZANTAC) 150 MG tablet Take 150 mg by mouth at bedtime.    Yes [provider]  tamsulosin (FLOMAX) 0.4 MG CAPS capsule Take 1 capsule (0.4 mg total) by mouth daily. 05/01/16  Yes McGowan, ShLarene Beach, PA-C  valACYclovir (VALTREX) 500 MG tablet TAKE 1 TABLET BY MOUTH DAILY 03/21/17  Yes Corcoran, MeDrue SecondMD  acetaminophen (TYLENOL) 500 MG tablet Take 1,000 mg by mouth every 8 (eight) hours as needed for mild pain or headache.     [provider]  prochlorperazine (COMPAZINE) 10 MG tablet Take 10 mg by mouth every 6 (six) hours as needed for nausea or vomiting.    [provider]    Allergies as of 03/13/2017 - Review Complete 03/13/2017  Allergen Reaction Noted  . Azithromycin Diarrhea 11/10/2015  . Bee pollen  02/05/2017  . Pollen extract Other (See Comments) 02/05/2017  . Zometa [zoledronic acid] Other (See Comments) 04/26/2015  . Rivaroxaban Rash 04/26/2015    Family History  Problem Relation Age of Onset  . Cancer Father        throat   . Kidney disease Sister   . Stroke Unknown   . Bladder Cancer Neg Hx   . Prostate cancer Neg Hx     Social History   Social History  . Marital status: Married    Spouse name: N/A  . Number of children: N/A  . Years of education: N/A   Occupational History  . Not on file.   Social History Main Topics  . Smoking status: Former Smoker    Packs/day: 2.50    Years: 12.00    Types: Cigarettes    Quit date: 1961. Smokeless tobacco: Never Used     Comment: Quit 40 years ago  . Alcohol use No  Comment: might have 1 beer/month  . Drug use: No  . Sexual activity: Not on file   Other Topics Concern  . Not on file   Social History Narrative  . No narrative on file    Review of Systems: See HPI, otherwise negative ROS  Physical Exam: BP (!) 145/93   Pulse 78   Temp 97.5 F (36.4 C) (Temporal)   Resp 16   Ht _0  (1.702 m)   Wt 145 lb (65.8 kg)   SpO2 100%   BMI 22.71 kg/m  General:   Alert,  pleasant and cooperative in NAD Head:  Normocephalic and atraumatic. Neck:  Supple; no masses or thyromegaly. Lungs:  Clear throughout to auscultation.    Heart:  Regular rate and rhythm. Abdomen:  Soft, nontender and nondistended. Normal bowel sounds, without guarding, and without rebound.   Neurologic:  Alert and  oriented x4;  grossly normal neurologically.  Impression/Plan: Logan Perez is here for an endoscopy and colonoscopy to be performed for GERD and diarrhea  Risks, benefits, limitations, and alternatives regarding  endoscopy and colonoscopy have been reviewed with the patient.  Questions have been answered.  All parties agreeable.   Lucilla Lame, MD  03/22/2017, 9:29 AM

## 2017-03-22 NOTE — Anesthesia Postprocedure Evaluation (Signed)
Anesthesia Post Note  Patient: Logan Perez  Procedure(s) Performed: Procedure(s) (LRB): COLONOSCOPY WITH PROPOFOL (N/A) ESOPHAGOGASTRODUODENOSCOPY (EGD) WITH PROPOFOL  Patient location during evaluation: PACU Anesthesia Type: General Level of consciousness: awake and alert Pain management: pain level controlled Vital Signs Assessment: post-procedure vital signs reviewed and stable Respiratory status: spontaneous breathing, nonlabored ventilation, respiratory function stable and patient connected to nasal cannula oxygen Cardiovascular status: blood pressure returned to baseline and stable Postop Assessment: no signs of nausea or vomiting Anesthetic complications: no    Saadia Dewitt

## 2017-03-23 ENCOUNTER — Encounter: Payer: Self-pay | Admitting: Gastroenterology

## 2017-03-23 LAB — FECAL LEUKOCYTES

## 2017-03-23 LAB — FECAL FAT, QUALITATIVE
FAT QUAL NEUTRAL STL: NORMAL
Fat Qual Total, Stl: NORMAL

## 2017-03-26 ENCOUNTER — Other Ambulatory Visit: Payer: Self-pay | Admitting: *Deleted

## 2017-03-26 ENCOUNTER — Encounter: Payer: Self-pay | Admitting: Gastroenterology

## 2017-03-26 DIAGNOSIS — C9000 Multiple myeloma not having achieved remission: Secondary | ICD-10-CM

## 2017-03-26 NOTE — Progress Notes (Signed)
[facility] day:  03/27/17  Chief Complaint: Berkley Cronkright is a 71 y.o. 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  HPI: The patient was last seen in the medical oncology clinic on 02/19/2017.  At that time, his diarrhea had improved dramatically and stool incontinence had resolved with the use of Lactaid.  C diff and GI panel by PCR was negative.  He received cycle #3 daratumumab.  He underwent EGD and colonoscopy on 03/22/2017 with Dr Lucilla Lame.  EGD was normal.  Colonoscopy revealed diverticulosis in the entire colon.  Random biopsies revealed focal active ileitis, negative for dysplasia.  Colon biopsy revealed chronic mucosal injury, negative for dysplasia.  Symptomatically, he is doing well.  He has a runny nose.  He denies cough.  He is using Imodium for chronic diarrhea.  He would like to resume PT as he is not doing his exercises.    Past Medical History:  Diagnosis Date  . Anxiety   . Atrial fibrillation ([place])   . BPH (benign prostatic hyperplasia)   . Complication of anesthesia    BAD HEADACHE NIGHT OF FIRST CATARACT  . Compression fracture of lumbar vertebra ([place])   . Difficulty voiding   . Dysrhythmia    A FIB  . Elevated PSA   . GERD (gastroesophageal reflux disease)   . Hearing aid worn    bilateral  . HLD (hyperlipidemia)   . HOH (hard of hearing)   . Hypertension   . Multiple myeloma ([place])   . Neuropathy    feet. 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  . Pain    BACK  . Palpitations   . Pneumonia   . Pulmonary embolism ([place])   . Stroke [facility])    TIA, detected on CT scan. pt was unaware    Past Surgical History:  Procedure Laterality Date  . BACK SURGERY  1994  . CATARACT EXTRACTION W/PHACO Right 05/02/2016   Procedure: CATARACT EXTRACTION PHACO AND INTRAOCULAR  LENS PLACEMENT (IOC);  Surgeon: Birder Robson, MD;  Location: [facility] ORS;  Service: Ophthalmology;  Laterality: Right;  [place] 1.06 AP% 20.6 CDE 13.70 FLUID PACK LOT # [id number]  . CATARACT EXTRACTION W/PHACO Left 05/16/2016   Procedure: CATARACT EXTRACTION PHACO AND INTRAOCULAR LENS PLACEMENT (IOC);  Surgeon: Birder Robson, MD;  Location: [facility] ORS;  Service: Ophthalmology;  Laterality: Left;  [place] 01:43 AP% 19.8 CDE 20.45 FLUID PACK LOT #[id number] H  . COLONOSCOPY WITH PROPOFOL N/A 03/22/2017   Procedure: COLONOSCOPY WITH PROPOFOL;  Surgeon: Lucilla Lame, MD;  Location: [place];  Service: Endoscopy;  Laterality: N/A;  has port  . ESOPHAGOGASTRODUODENOSCOPY (EGD) WITH PROPOFOL  03/22/2017   Procedure: ESOPHAGOGASTRODUODENOSCOPY (EGD) WITH PROPOFOL;  Surgeon: Lucilla Lame, MD;  Location: [place];  Service: Endoscopy;;  . KNEE ARTHROSCOPY Left 1992  . LIMBAL STEM CELL TRANSPLANT    . PAIN PUMP IMPLANTATION  2012  . PORTA CATH INSERTION N/A 12/04/2016   Procedure: Glori Luis Cath Insertion;  Surgeon: Algernon Huxley, MD;  Location: [facility];  Service: Cardiovascular;  Laterality: N/A;  . stem cell implant  2008   [facility]    Family History  Problem Relation Age of Onset  . Cancer Father        throat  . Kidney disease Sister   . Stroke Unknown   .  Bladder Cancer Neg Hx   . Prostate cancer Neg Hx     Social History:  reports that he quit smoking about 41 years ago. His smoking use included Cigarettes. He has a 30.00 pack-year smoking history. He has never used smokeless tobacco. He reports that he does not drink alcohol or use drugs.  He has a wire haired dachshund.  He lives in [place].  His wife's name is [name].  The patient is alone today.  Allergies:  Allergies  Allergen Reactions  . Azithromycin Diarrhea    Possible cause of C. Diff  . Bee Pollen     Other reaction(s): Other (See Comments) Sneezing, watery eyes, runny nose  . Pollen Extract Other (See Comments)     Sneezing, watery eyes, runny nose  . Zometa [Zoledronic Acid] Other (See Comments)    ONG- Osteonecrosis of the jaw   . Rivaroxaban Rash    Current Medications: Current Outpatient Prescriptions  Medication Sig Dispense Refill  . acetaminophen (TYLENOL) 500 MG tablet Take 1,000 mg by mouth every 8 (eight) hours as needed for mild pain or headache.     . ALPRAZolam (XANAX) 0.25 MG tablet TAKE 1 TABLET BY MOUTH EVERY AT BEDTIME AS NEEDED FOR ANXIETY 30 tablet 0  . baclofen (LIORESAL) 10 MG tablet Take 1 tablet (10 mg total) by mouth at bedtime. 30 each 3  . calcium citrate-vitamin D (CITRACAL+D) 315-200 MG-UNIT per tablet Take 1 tablet by mouth daily.     . cetirizine (ZYRTEC) 10 MG tablet Take 10 mg by mouth daily as needed for allergies.     . Cholecalciferol (VITAMIN D3 PO) Take by mouth daily.    . Cyanocobalamin (VITAMIN B-12 PO) Take by mouth daily.    . Daratumumab (DARZALEX IV) Inject into the vein.    [name] dexamethasone (DECADRON) 4 MG tablet Take 4 tabs PO 1 hr prior to infusion, then take 1 tab on day 2 and day 3 30 tablet 1  . diltiazem (CARDIZEM CD) 120 MG 24 hr capsule Take by mouth.    . diltiazem (CARDIZEM) 60 MG tablet Take 60 mg by mouth as needed (raised heart rate).     [name] ELIQUIS 5 MG TABS tablet TAKE 1 TABLET(5 MG) BY MOUTH TWICE DAILY 60 tablet 0  . ENSURE (ENSURE) Take 237 mLs by mouth daily.    . fluticasone (FLONASE) 50 MCG/ACT nasal spray Place 1 spray into the nose daily as needed for allergies.     . Iron-Vitamins (GERITOL PO) Take 1 tablet by mouth daily.     [name] lidocaine-prilocaine (EMLA) cream Apply 1 application topically as needed. 30 g 0  . loperamide (IMODIUM) 2 MG capsule Take 2-4 mg by mouth as needed for diarrhea or loose stools. Reported on 04/26/2016    . lovastatin (MEVACOR) 20 MG tablet Take 20 mg by mouth every evening.     . montelukast (SINGULAIR) 10 MG tablet TAKE 1 TABLET BY MOUTH ONCE A DAY AS DIRECTED. (Patient taking differently: Take 64ms  daily the day before the infusion, the day of, and the 2 days after infusion) 30 tablet 0  . naproxen sodium (ANAPROX) 220 MG tablet Take 220-440 mg by mouth 2 (two) times daily as needed (pain).    .Marland Kitchenomeprazole (PRILOSEC) 20 MG capsule Take 20 mg by mouth daily.     . potassium chloride SA (K-DUR,KLOR-CON) 20 MEQ tablet TAKE 1 TABLET(20 MEQ) BY MOUTH DAILY 90 tablet 0  . ranitidine (ZANTAC) 150 MG tablet Take  150 mg by mouth at bedtime.     . tamsulosin (FLOMAX) 0.4 MG CAPS capsule Take 1 capsule (0.4 mg total) by mouth daily. 90 capsule 4  . valACYclovir (VALTREX) 500 MG tablet TAKE 1 TABLET BY MOUTH DAILY 30 tablet 0  . PAIN MANAGEMENT IT PUMP REFILL 1 each by Intrathecal route once. Medication: PF Fentanyl 1,500.0 mcg/ml PF Bupivicaine 30.0 mg/ml PF Clonidine 300.15mg/ml Total Volume: 40 ml Needed by 04-10-17 @ 1045 1 each 0  . prochlorperazine (COMPAZINE) 10 MG tablet Take 10 mg by mouth every 6 (six) hours as needed for nausea or vomiting.     No current facility-administered medications for this visit.    Facility-Administered Medications Ordered in Other Visits  Medication Dose Route Frequency Provider Last Rate Last Dose  . heparin lock flush 100 unit/mL  500 Units Intravenous Once CLequita Asal MD        Review of Systems:  GENERAL: Feels "good".  Energy level normal.  No fevers or sweats.  Weight up 1 pound.  PERFORMANCE STATUS (ECOG): 1 HEENT:  Cataracts s/p surgery.  Altered taste sensation.  Runny nose.  No sore throat, mouth sores or tenderness. Lungs:  No shortness of breath or cough.  No hemoptysis. Cardiac:  No chest pain, palpitations, orthopnea, or PND. GI:  Appetite good. Diarrhea controlled with Imodium.  No vomiting, constipation, melena or hematochezia. GU:  No urgency, frequency, dysuria, or hematuria. Musculoskeletal:  Compression fracture of back on a pain pump. No muscle tenderness. Extremities:  No pain or swelling. Skin:  Fragile skin.  No rashes or  skin changes. Neuro:  Little tremor (chronic).  Neuropathy in feet.  No headache, weakness, balance or coordination issues. Endocrine:  No diabetes, thyroid issues, hot flashes or night sweats. Psych:  No mood changes, depression or anxiety. Pain:  Chronic back pain (pain well managed with pump). Review of systems:  All other systems reviewed and found to be negative.   Physical Exam: Blood pressure 134/80, pulse 94, temperature 97.3 F (36.3 C), temperature source Tympanic, resp. rate 18, weight 152 lb 5 oz (69.1 kg). GENERAL:  Thin elderly gentleman sitting comfortably in the exam room in no acute distress.  MENTAL STATUS:  Alert and oriented to person, place and time. HEAD:  Short brown hair.  Graying goatee.  Normocephalic, atraumatic, face symmetric, no Cushingoid features. EYES:  Glasses.  Blue eyes s/p cataract surgery.  Pupils equal round and reactive to light and accomodation.  No conjunctivitis or scleral icterus. ENT:  Hearing aide.  Oropharynx clear without lesion.  Hearing aide.  Tongue normal. Mucous membranes moist.  RESPIRATORY:  Clear to auscultation without rales, wheezes or rhonchi. CARDIOVASCULAR:  Regular rate and rhythm without murmur, rub or gallop. ABDOMEN:  RUQ pain pump.  Soft, non-tender, with active bowel sounds, and no hepatosplenomegaly.  No masses. SKIN:  No rashes, ulcers or lesions. EXTREMITIES: Trace ankle edema.  No skin discoloration or tenderness.  No palpable cords. LYMPH NODES: No palpable cervical, supraclavicular, axillary or inguinal adenopathy  NEUROLOGICAL: Intention tremor. PSYCH:  Appropriate.    Infusion on 03/27/2017  Component Date Value Ref Range Status  . WBC 03/27/2017 8.5  3.8 - 10.6 K/uL Final  . RBC 03/27/2017 3.08* 4.40 - 5.90 MIL/uL Final  . Hemoglobin 03/27/2017 11.3* 13.0 - 18.0 g/dL Final  . HCT 03/27/2017 31.4* 40.0 - 52.0 % Final  . MCV 03/27/2017 102.0* 80.0 - 100.0 fL Final  . MCH 03/27/2017 36.9* 26.0 - 34.0 pg Final  .  MCHC 03/27/2017 36.1* 32.0 - 36.0 g/dL Final  . RDW 03/27/2017 13.7  11.5 - 14.5 % Final  . Platelets 03/27/2017 99* 150 - 440 K/uL Final  . Neutrophils Relative % 03/27/2017 54  % Final  . Neutro Abs 03/27/2017 4.7  1.4 - 6.5 K/uL Final  . Lymphocytes Relative 03/27/2017 32  % Final  . Lymphs Abs 03/27/2017 2.7  1.0 - 3.6 K/uL Final  . Monocytes Relative 03/27/2017 11  % Final  . Monocytes Absolute 03/27/2017 1.0  0.2 - 1.0 K/uL Final  . Eosinophils Relative 03/27/2017 2  % Final  . Eosinophils Absolute 03/27/2017 0.1  0 - 0.7 K/uL Final  . Basophils Relative 03/27/2017 1  % Final  . Basophils Absolute 03/27/2017 0.1  0 - 0.1 K/uL Final  . Sodium 03/27/2017 143  135 - 145 mmol/L Final  . Potassium 03/27/2017 3.8  3.5 - 5.1 mmol/L Final  . Chloride 03/27/2017 109  101 - 111 mmol/L Final  . CO2 03/27/2017 26  22 - 32 mmol/L Final  . Glucose, Bld 03/27/2017 113* 65 - 99 mg/dL Final  . BUN 03/27/2017 23* 6 - 20 mg/dL Final  . Creatinine, Ser 03/27/2017 1.23  0.61 - 1.24 mg/dL Final  . Calcium 03/27/2017 8.8* 8.9 - 10.3 mg/dL Final  . Total Protein 03/27/2017 5.9* 6.5 - 8.1 g/dL Final  . Albumin 03/27/2017 3.8  3.5 - 5.0 g/dL Final  . AST 03/27/2017 21  15 - 41 U/L Final  . ALT 03/27/2017 18  17 - 63 U/L Final  . Alkaline Phosphatase 03/27/2017 58  38 - 126 U/L Final  . Total Bilirubin 03/27/2017 0.7  0.3 - 1.2 mg/dL Final  . GFR calc non Af Amer 03/27/2017 57* >60 mL/min Final  . GFR calc Af Amer 03/27/2017 >60  >60 mL/min Final   Comment: (NOTE) The eGFR has been calculated using the CKD EPI equation. This calculation has not been validated in all clinical situations. eGFR's persistently <60 mL/min signify possible Chronic Kidney Disease.   . Anion gap 03/27/2017 8  5 - 15 Final  . Magnesium 03/27/2017 1.7  1.7 - 2.4 mg/dL Final    Assessment:  Starsky Nanna is a 71 y.o. male with stage III mutiple myeloma.  He initially presented with progressive back pain beginning in  12/2006.  MRI revealed "spots and compression fractures".  He began Velcade, thalidomide, and Decadron.  In 08/2007, he underwent high dose chemotherapy and autologous stem cell transplant.  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  He recurred with a rising M-spike (2.7) with repeat M spike (1.7 gm/dl) in 03/2010.  He was initially treated with Velcade (02/08/2010 - 05/10/2010).  He then began Revlimid (15 mg 3 weeks on/1 week off) and Decadron (40 mg on day 1, 8, 15, 22).  Because of significant side effect with Decadron his dose was decreased to 10 mg once a week in 07/2010.    He was on maintenance Revlimid. Revlimid was initially10 mg 3 weeks on/1 week off. This was changed to 10 mg 2 weeks on/2 weeks off secondary to right nipple tenderness. His dose was increased to 10 mg 3 weeks on/1 week off with Decadron 10 mg a week (on Sundays) and then Revlamid 15 mg 3 weeks on and 1 week off with Decadron on Sundays.  He began Pomalyst 4 mg 3 weeks on/1 week off with Decadron on 08/27/2015.  Over the past year his SPEP has revealed no monoclonal  protein (04/21/2015) and 0.5 gm/dL on 09/22/2015 and 10/20/2015. M spike was 0.1 on 02/02/2016, 03/01/2016, 03/29/2016, 04/26/2016, and 05/24/2016.  M spike was 0 on 10/11/2016, 11/28/2016, 02/05/2017, and 03/21/2017.  Free light chains have been monitored. Kappa free light chains were 18.54 on 11/21/2013, 18.37 on 02/20/2014, 18.93 on 05/22/2014, 32.58 (high; normal ratio 1.27) on 08/21/2014, 51.53 (high; elevated ratio of 2.12) on 11/13/2014, 28.08 (ratio 1.73) on 12/09/2014, 23.71 (ratio 2.17) on 01/18/2015, 92.93 (ratio 9.49) on 04/21/2015, 93.44 (ratio 10.28) on 05/26/2015, 255.45 (ratio of 24.05) on 07/14/2015, 373.89 (ratio 48.31) on 08/04/2015, 474.33 (ratio 70.58) on 08/25/2015, 450.76 (ratio 55.44) on 09/22/2015, 453.4 (ratio 59.89) on 10/20/2015, 58.26 (ratio of > 40.74) on 02/02/2016, 62.67 (ratio of 37.98) on 03/01/2016, 75.7  (ratio > 50.47) on 03/29/2016, 79.7 (ratio 53.13) on 04/26/2016, 108.6 (ratio > 72.4) on 05/24/2016, 8.3 (1.46 ratio) on 10/11/2016, 6.7 (0.81 ratio) on 02/05/2017, and 7.8 (1.01 ratio) on 03/21/2017.  Bone survey on 12/08/2014 was stable.  Bone survey on 10/21/2015 revealed increase conspicuity of subcentimeter lytic lesions in the calvarium.  Bone marrow aspirate and biopsy on 11/04/2015 revealed an atypical monoclonal plasma cells estimated at 30-40% of marrow cells.   Marrow was variably cellular (approximately 45%) with background trilineage hematopoiesis. There was no significant increase in marrow reticulin fibers. Storage iron was present.    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 He develoed herpes zoster in 04/2008. He developed a pulmonary embolism in 05/2013. He was initially on Xarelto, but is now on Eliquis. He had an episode of pneumonia around this time requiring a brief admission. He developed severe lower leg cramps on 08/07/2014 secondary to hypokalemia. Duplex was negative.   He was treated for C difficile colitis (Flagyl completed 07/30/2015).  He has a chronic indwelling pain pump.  He received 4 cycles of Pomalyst and Decadron (08/27/2015 - 11/19/2015).  Restaging studies document progressive disease.  Kappa free light chains are increasing.  SPEP revealed 0.5 gm/dL monoclonal protein then 1.3 gm/dL.  Bone survey reveals increase conspicuity of subcentimeter lytic lesions in the calvarium.  Bone marrow reveals 30-40% plasma cells.   MUGA on 11/30/2015 revealed an ejection fraction of 46%.  He is felt not to be a good candidate for Kyprolis.  There were no focal wall motion abnormalities.  He had a stress echo less than 1 year ago.  He has a history of PVCs and atrial fibrillation.  He takes Cardizem prn.  He received 17 weeks of daratumumab (Darzalex) (12/09/2015 - 05/25/2016).  He tolerated treatment well without  side effect.  Bone marrow on 02/09/2016 revealed no diagnostic morphologic evidence of plasma cell myeloma.  Marrow was normocellular to hypocellular marrow for age (ranging from 10-40%) with maturing trilineage hematopoiesis and mild multilineage dyspoiesis.  There was patchy mild increase in reticulin.  Storage iron was present.  Flow cytometry revealed no definitive evidence of monoclonality.  There was a non-specific atypical myeloid and monocytic findings with no increase in blasts.  Cytogenetics were normal (46, XY).  He is currently day 280 s/p 2nd autologous stem cell transplant on 06/16/2016.  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  He also experienced atrial fibrillation with intermittent episodes of RVR requiring IV beta blockers.  He is on prophylactic valacyclovir for 1 year post transplant.  He is s/p 3 cycles of daratumumab (Darzalex) post transplant (12/05/2016 -02/19/2017).  He has a macrocytic anemia secondary to anemia  of chronic disease.  Work-up on 08/23/2016 revealed the following normal studies:  B12 and folate.  Ferritin was 1379 (high).  Iron studies included a saturation of 56% and TIBC 141 (low).  ESR was 64.  Thrombocytopenia has resolved.  He has a history of persistent hypomagnesemia and receives IV magnesium (2-4 gm) weekly.  He has not required magnesium since 10/24/2016.  Symptomatically, his diarrhea has improved dramatically and stool incontinence has resolved with the use of Lactaid.  C diff and GI panel by PCR was negative.  Exam is normal.  M spike is 0.  Plan:  1.  Labs today:  CBC with diff, CMP, Mg. 2.  Review interval EGD and colonoscopy. 3.  Redraw platelet count 4.  Contact transplant center [id number]. 5.  No treatment today. 6.  RTC in 1 week for labs (CBC with diff, CMP) and Darzalex. 7.  RTC in 5 weeks for MD assessment, labs (CBC with diff, CMP, Mg), and Darzalex   Lequita Asal,  MD  03/27/2017, 10:02 AM

## 2017-03-27 ENCOUNTER — Inpatient Hospital Stay (HOSPITAL_BASED_OUTPATIENT_CLINIC_OR_DEPARTMENT_OTHER): Payer: Medicare Other | Admitting: Hematology and Oncology

## 2017-03-27 ENCOUNTER — Inpatient Hospital Stay: Payer: Medicare Other

## 2017-03-27 ENCOUNTER — Encounter: Payer: Self-pay | Admitting: Hematology and Oncology

## 2017-03-27 VITALS — BP 134/80 | HR 94 | Temp 97.3°F | Resp 18 | Wt 152.3 lb

## 2017-03-27 DIAGNOSIS — Z9484 Stem cells transplant status: Secondary | ICD-10-CM | POA: Diagnosis not present

## 2017-03-27 DIAGNOSIS — R1011 Right upper quadrant pain: Secondary | ICD-10-CM | POA: Diagnosis not present

## 2017-03-27 DIAGNOSIS — N419 Inflammatory disease of prostate, unspecified: Secondary | ICD-10-CM | POA: Diagnosis not present

## 2017-03-27 DIAGNOSIS — G8929 Other chronic pain: Secondary | ICD-10-CM

## 2017-03-27 DIAGNOSIS — Z79899 Other long term (current) drug therapy: Secondary | ICD-10-CM

## 2017-03-27 DIAGNOSIS — R197 Diarrhea, unspecified: Secondary | ICD-10-CM

## 2017-03-27 DIAGNOSIS — K5790 Diverticulosis of intestine, part unspecified, without perforation or abscess without bleeding: Secondary | ICD-10-CM

## 2017-03-27 DIAGNOSIS — C9002 Multiple myeloma in relapse: Secondary | ICD-10-CM

## 2017-03-27 DIAGNOSIS — Z86711 Personal history of pulmonary embolism: Secondary | ICD-10-CM

## 2017-03-27 DIAGNOSIS — Z8673 Personal history of transient ischemic attack (TIA), and cerebral infarction without residual deficits: Secondary | ICD-10-CM

## 2017-03-27 DIAGNOSIS — Z978 Presence of other specified devices: Secondary | ICD-10-CM | POA: Diagnosis not present

## 2017-03-27 DIAGNOSIS — C9000 Multiple myeloma not having achieved remission: Secondary | ICD-10-CM

## 2017-03-27 DIAGNOSIS — Z5112 Encounter for antineoplastic immunotherapy: Secondary | ICD-10-CM | POA: Diagnosis not present

## 2017-03-27 DIAGNOSIS — I10 Essential (primary) hypertension: Secondary | ICD-10-CM

## 2017-03-27 DIAGNOSIS — Z7901 Long term (current) use of anticoagulants: Secondary | ICD-10-CM

## 2017-03-27 DIAGNOSIS — D696 Thrombocytopenia, unspecified: Secondary | ICD-10-CM

## 2017-03-27 DIAGNOSIS — K529 Noninfective gastroenteritis and colitis, unspecified: Secondary | ICD-10-CM

## 2017-03-27 DIAGNOSIS — M549 Dorsalgia, unspecified: Secondary | ICD-10-CM

## 2017-03-27 DIAGNOSIS — R439 Unspecified disturbances of smell and taste: Secondary | ICD-10-CM

## 2017-03-27 DIAGNOSIS — K219 Gastro-esophageal reflux disease without esophagitis: Secondary | ICD-10-CM

## 2017-03-27 DIAGNOSIS — I4891 Unspecified atrial fibrillation: Secondary | ICD-10-CM

## 2017-03-27 DIAGNOSIS — E785 Hyperlipidemia, unspecified: Secondary | ICD-10-CM

## 2017-03-27 DIAGNOSIS — F419 Anxiety disorder, unspecified: Secondary | ICD-10-CM

## 2017-03-27 DIAGNOSIS — Z800 Family history of malignant neoplasm of digestive organs: Secondary | ICD-10-CM

## 2017-03-27 DIAGNOSIS — H9190 Unspecified hearing loss, unspecified ear: Secondary | ICD-10-CM

## 2017-03-27 DIAGNOSIS — D638 Anemia in other chronic diseases classified elsewhere: Secondary | ICD-10-CM | POA: Diagnosis not present

## 2017-03-27 DIAGNOSIS — Z87891 Personal history of nicotine dependence: Secondary | ICD-10-CM

## 2017-03-27 DIAGNOSIS — Z8701 Personal history of pneumonia (recurrent): Secondary | ICD-10-CM

## 2017-03-27 DIAGNOSIS — Z8739 Personal history of other diseases of the musculoskeletal system and connective tissue: Secondary | ICD-10-CM

## 2017-03-27 DIAGNOSIS — C9001 Multiple myeloma in remission: Secondary | ICD-10-CM

## 2017-03-27 LAB — COMPREHENSIVE METABOLIC PANEL
ALT: 18 U/L (ref 17–63)
AST: 21 U/L (ref 15–41)
Albumin: 3.8 g/dL (ref 3.5–5.0)
Alkaline Phosphatase: 58 U/L (ref 38–126)
Anion gap: 8 (ref 5–15)
BUN: 23 mg/dL — ABNORMAL HIGH (ref 6–20)
CO2: 26 mmol/L (ref 22–32)
Calcium: 8.8 mg/dL — ABNORMAL LOW (ref 8.9–10.3)
Chloride: 109 mmol/L (ref 101–111)
Creatinine, Ser: 1.23 mg/dL (ref 0.61–1.24)
GFR calc Af Amer: >60 mL/min (ref >60)
GFR calc non Af Amer: 57 mL/min — ABNORMAL LOW (ref >60)
Glucose, Bld: 113 mg/dL — ABNORMAL HIGH (ref 65–99)
Potassium: 3.8 mmol/L (ref 3.5–5.1)
Sodium: 143 mmol/L (ref 135–145)
Total Bilirubin: 0.7 mg/dL (ref 0.3–1.2)
Total Protein: 5.9 g/dL — ABNORMAL LOW (ref 6.5–8.1)

## 2017-03-27 LAB — CBC WITH DIFFERENTIAL/PLATELET
Basophils Absolute: 0.1 10*3/uL (ref 0–0.1)
Basophils Relative: 1 %
Eosinophils Absolute: 0.1 10*3/uL (ref 0–0.7)
Eosinophils Relative: 2 %
HCT: 31.4 % — ABNORMAL LOW (ref 40.0–52.0)
Hemoglobin: 11.3 g/dL — ABNORMAL LOW (ref 13.0–18.0)
Lymphocytes Relative: 32 %
Lymphs Abs: 2.7 10*3/uL (ref 1.0–3.6)
MCH: 36.9 pg — ABNORMAL HIGH (ref 26.0–34.0)
MCHC: 36.1 g/dL — ABNORMAL HIGH (ref 32.0–36.0)
MCV: 102 fL — ABNORMAL HIGH (ref 80.0–100.0)
Monocytes Absolute: 1 10*3/uL (ref 0.2–1.0)
Monocytes Relative: 11 %
Neutro Abs: 4.7 10*3/uL (ref 1.4–6.5)
Neutrophils Relative %: 54 %
Platelets: 99 10*3/uL — ABNORMAL LOW (ref 150–440)
RBC: 3.08 MIL/uL — ABNORMAL LOW (ref 4.40–5.90)
RDW: 13.7 % (ref 11.5–14.5)
WBC: 8.5 10*3/uL (ref 3.8–10.6)

## 2017-03-27 LAB — MAGNESIUM: Magnesium: 1.7 mg/dL (ref 1.7–2.4)

## 2017-03-27 LAB — PLATELET COUNT: Platelets: 100 10*3/uL — ABNORMAL LOW (ref 150–440)

## 2017-03-27 MED ORDER — SODIUM CHLORIDE 0.9% FLUSH
10.0000 mL | Freq: Once | INTRAVENOUS | Status: AC
  Administered 2017-03-27: 10 mL via INTRAVENOUS
  Filled 2017-03-27: qty 10

## 2017-03-27 MED ORDER — HEPARIN SOD (PORK) LOCK FLUSH 100 UNIT/ML IV SOLN
500.0000 [IU] | Freq: Once | INTRAVENOUS | Status: AC
  Administered 2017-03-27: 500 [IU] via INTRAVENOUS
  Filled 2017-03-27: qty 5

## 2017-03-27 NOTE — Progress Notes (Signed)
Patient has chronic diarrhea but controls it with imodium.  Otherwise, no complaints.  Patient had colonoscopy last week.  No polyps or problems.

## 2017-04-03 ENCOUNTER — Other Ambulatory Visit: Payer: Self-pay | Admitting: Hematology and Oncology

## 2017-04-03 ENCOUNTER — Ambulatory Visit: Payer: Medicare Other

## 2017-04-03 ENCOUNTER — Other Ambulatory Visit: Payer: Medicare Other

## 2017-04-03 DIAGNOSIS — C9001 Multiple myeloma in remission: Secondary | ICD-10-CM

## 2017-04-06 ENCOUNTER — Inpatient Hospital Stay: Payer: Medicare Other

## 2017-04-06 ENCOUNTER — Other Ambulatory Visit: Payer: Self-pay | Admitting: Internal Medicine

## 2017-04-06 VITALS — BP 138/80 | HR 90 | Resp 20

## 2017-04-06 DIAGNOSIS — C9001 Multiple myeloma in remission: Secondary | ICD-10-CM

## 2017-04-06 DIAGNOSIS — C9002 Multiple myeloma in relapse: Secondary | ICD-10-CM

## 2017-04-06 DIAGNOSIS — Z5112 Encounter for antineoplastic immunotherapy: Secondary | ICD-10-CM

## 2017-04-06 LAB — COMPREHENSIVE METABOLIC PANEL
ALT: 14 U/L — ABNORMAL LOW (ref 17–63)
AST: 19 U/L (ref 15–41)
Albumin: 3.8 g/dL (ref 3.5–5.0)
Alkaline Phosphatase: 59 U/L (ref 38–126)
Anion gap: 7 (ref 5–15)
BUN: 19 mg/dL (ref 6–20)
CO2: 25 mmol/L (ref 22–32)
Calcium: 9 mg/dL (ref 8.9–10.3)
Chloride: 108 mmol/L (ref 101–111)
Creatinine, Ser: 1.39 mg/dL — ABNORMAL HIGH (ref 0.61–1.24)
GFR calc Af Amer: 57 mL/min — ABNORMAL LOW (ref >60)
GFR calc non Af Amer: 49 mL/min — ABNORMAL LOW (ref >60)
Glucose, Bld: 118 mg/dL — ABNORMAL HIGH (ref 65–99)
Potassium: 3.9 mmol/L (ref 3.5–5.1)
Sodium: 140 mmol/L (ref 135–145)
Total Bilirubin: 0.6 mg/dL (ref 0.3–1.2)
Total Protein: 5.9 g/dL — ABNORMAL LOW (ref 6.5–8.1)

## 2017-04-06 LAB — CBC WITH DIFFERENTIAL/PLATELET
Basophils Absolute: 0 10*3/uL (ref 0–0.1)
Basophils Relative: 1 %
Eosinophils Absolute: 0.2 10*3/uL (ref 0–0.7)
Eosinophils Relative: 2 %
HCT: 29.7 % — ABNORMAL LOW (ref 40.0–52.0)
Hemoglobin: 10.7 g/dL — ABNORMAL LOW (ref 13.0–18.0)
Lymphocytes Relative: 21 %
Lymphs Abs: 1.8 10*3/uL (ref 1.0–3.6)
MCH: 36.6 pg — ABNORMAL HIGH (ref 26.0–34.0)
MCHC: 36 g/dL (ref 32.0–36.0)
MCV: 101.7 fL — ABNORMAL HIGH (ref 80.0–100.0)
Monocytes Absolute: 0.8 10*3/uL (ref 0.2–1.0)
Monocytes Relative: 9 %
Neutro Abs: 5.9 10*3/uL (ref 1.4–6.5)
Neutrophils Relative %: 67 %
Platelets: 150 10*3/uL (ref 150–440)
RBC: 2.92 MIL/uL — ABNORMAL LOW (ref 4.40–5.90)
RDW: 13.9 % (ref 11.5–14.5)
WBC: 8.7 10*3/uL (ref 3.8–10.6)

## 2017-04-06 MED ORDER — SODIUM CHLORIDE 0.9 % IV SOLN
Freq: Once | INTRAVENOUS | Status: AC
  Administered 2017-04-06: 10:00:00 via INTRAVENOUS
  Filled 2017-04-06: qty 1000

## 2017-04-06 MED ORDER — HEPARIN SOD (PORK) LOCK FLUSH 100 UNIT/ML IV SOLN
500.0000 [IU] | Freq: Once | INTRAVENOUS | Status: AC | PRN
  Administered 2017-04-06: 500 [IU]

## 2017-04-06 MED ORDER — ACETAMINOPHEN 325 MG PO TABS
650.0000 mg | ORAL_TABLET | Freq: Once | ORAL | Status: AC
  Administered 2017-04-06: 650 mg via ORAL

## 2017-04-06 MED ORDER — ONDANSETRON 8 MG PO TBDP
8.0000 mg | ORAL_TABLET | Freq: Once | ORAL | Status: AC
  Administered 2017-04-06: 8 mg via ORAL

## 2017-04-06 MED ORDER — SODIUM CHLORIDE 0.9 % IV SOLN
1000.0000 mg | Freq: Once | INTRAVENOUS | Status: AC
  Administered 2017-04-06: 1000 mg via INTRAVENOUS
  Filled 2017-04-06: qty 50

## 2017-04-06 MED ORDER — DIPHENHYDRAMINE HCL 25 MG PO CAPS
50.0000 mg | ORAL_CAPSULE | Freq: Once | ORAL | Status: AC
  Administered 2017-04-06: 50 mg via ORAL

## 2017-04-09 ENCOUNTER — Telehealth: Payer: Self-pay | Admitting: *Deleted

## 2017-04-09 MED ORDER — CEPHALEXIN 500 MG PO CAPS: 500.0000 mg | ORAL_CAPSULE | Freq: Two times a day (BID) | ORAL | 0 refills | Status: DC

## 2017-04-09 NOTE — Progress Notes (Signed)
Patient's Name: Logan Perez  MRN: [id number]  Referring Provider: No ref. provider found  DOB: 15-Oct-1945  PCP: Patient, No Pcp Per  DOS: 04/10/2017  Note by: Logan Woldt A. Dossie Arbour, MD  Service setting: Ambulatory outpatient  Specialty: Interventional Pain Management  Patient type: Established  Location: [facility] (AMB) Pain Management Facility  Visit type: Interventional Procedure   Primary Reason for Visit: Interventional Pain Management Treatment. CC: No chief complaint on file.  Procedure:  Intrathecal Drug Delivery System (IDDS):  Type: Reservoir Refill [phone]) No rate change Region: Abdominal Laterality: Right  Type of Pump: Medtronic Synchromed II (MRI-compatible) Delivery Route: Intrathecal Type of Pain Treated: Neuropathic/Nociceptive Primary Medication Class: Opioid/opiate  Medication, Concentration, Infusion Program, & Delivery Rate: Please see scanned programming printout.  Indications: 1. Chronic pain syndrome   2. Cancer associated pain   3. Compression fracture of T12 vertebra (HCC) (70-75% magnitude) (with mild retropulsion)   4. Multiple myeloma, remission status unspecified ([place])   5. Presence of implanted infusion pump (Medtronic, programmable, intrathecal pump)   6. Encounter for adjustment or management of infusion pump   7. Long term current use of opiate analgesic    Pain Assessment: Self-Reported Pain Score: 2 /10             Reported level is compatible with observation.        Intrathecal Pump Therapy Assessment  Manufacturer: Medtronic Synchromed II Type: Programmable Volume: 40 mL reservoir MRI compatibility: Yes   Drug content:  Primary Medication Class: Opioid Primary Medication: PF-Fentanyl Secondary Medication: PF-Bupivacaine Other Medication: PF-Clonidine   Programming:  Type: Simple continuous. See pump readout for details.   Changes:  Medication Change: None at this point Rate Change: No change in rate  Reported side-effects or  adverse reactions: None reported  Effectiveness: Described as relatively effective, allowing for increase in activities of daily living (ADL) Clinically meaningful improvement in function (CMIF): Sustained CMIF goals met  Plan: Pump refill today  Pre-op Assessment:  Previous date of service: 01/02/17 Service provided: Procedure (pump is very effective for pt) Logan Perez is a 71 y.o. (year old), male patient, seen today for interventional treatment. He  has a past surgical history that includes Back surgery (1994); Knee arthroscopy (Left, 1992); Pain pump implantation (2012); stem cell implant (2008); Cataract extraction w/PHACO (Right, 05/02/2016); Cataract extraction w/PHACO (Left, 05/16/2016); Limbal stem cell transplant; Porta Cath Insertion (N/A, 12/04/2016); Colonoscopy with propofol (N/A, 03/22/2017); and Esophagogastroduodenoscopy (egd) with propofol (03/22/2017). Logan Perez has a current medication list which includes the following prescription(s): acetaminophen, alprazolam, baclofen, calcium citrate-vitamin d, cephalexin, cetirizine, cholecalciferol, cyanocobalamin, daratumumab, dexamethasone, diltiazem, diltiazem, eliquis, ensure, fluticasone, iron-vitamins, lidocaine-prilocaine, loperamide, lovastatin, montelukast, naproxen sodium, omeprazole, potassium chloride sa, prochlorperazine, ranitidine, tamsulosin, valacyclovir, and PAIN MANAGEMENT IT PUMP REFILL. His primarily concern today is the No chief complaint on file.  Initial Vital Signs: There were no vitals taken for this visit. BMI: 23.49 kg/m  Risk Assessment: Allergies: Reviewed. He is allergic to azithromycin; bee pollen; pollen extract; zometa [zoledronic acid]; and rivaroxaban.  Allergy Precautions: None required Coagulopathies: Reviewed. None identified.  Blood-thinner therapy: None at this time Active Infection(s): Reviewed. None identified. Logan Perez is afebrile  Site Confirmation: Logan Perez was asked to confirm the  procedure and laterality before marking the site Procedure checklist: Completed Consent: Before the procedure and under the influence of no sedative(s), amnesic(s), or anxiolytics, the patient was informed of the treatment options, risks and possible complications. To fulfill our ethical and legal obligations, as recommended by the  American Medical Association's Code of Ethics, I have informed the patient of my clinical impression; the nature and purpose of the treatment or procedure; the risks, benefits, and possible complications of the intervention; the alternatives, including doing nothing; the risk(s) and benefit(s) of the alternative treatment(s) or procedure(s); and the risk(s) and benefit(s) of doing nothing.  Logan Perez was provided with information about the general risks and possible complications associated with most interventional procedures. These include, but are not limited to: failure to achieve desired goals, infection, bleeding, organ or nerve damage, allergic reactions, paralysis, and/or death.  In addition, he was informed of those risks and possible complications associated to this particular procedure, which include, but are not limited to: damage to the implant; failure to decrease pain; local, systemic, or serious CNS infections, intraspinal abscess with possible cord compression and paralysis, or life-threatening such as meningitis; bleeding; organ damage; nerve injury or damage with subsequent sensory, motor, and/or autonomic system dysfunction, resulting in transient or permanent pain, numbness, and/or weakness of one or several areas of the body; allergic reactions, either minor or major life-threatening, such as anaphylactic or anaphylactoid reactions.  Furthermore, Logan Perez was informed of those risks and complications associated with the medications. These include, but are not limited to: allergic reactions (i.e.: anaphylactic or anaphylactoid reactions); endorphine  suppression; bradycardia and/or hypotension; water retention and/or peripheral vascular relaxation leading to lower extremity edema and possible stasis ulcers; respiratory depression and/or shortness of breath; decreased metabolic rate leading to weight gain; swelling or edema; medication-induced neural toxicity; particulate matter embolism and blood vessel occlusion with resultant organ, and/or nervous system infarction; and/or intrathecal granuloma formation with possible spinal cord compression and permanent paralysis.  Before refilling the pump Mr. Jian was informed that some of the medications used in the devise may not be FDA approved for such use and therefore it constitutes an off-label use of the medications.  Finally, he was informed that Medicine is not an exact science; therefore, there is also the possibility of unforeseen or unpredictable risks and/or possible complications that may result in a catastrophic outcome. The patient indicated having understood very clearly. We have given the patient no guarantees and we have made no promises. Enough time was given to the patient to ask questions, all of which were answered to the patient's satisfaction. Mr. Mealey has indicated that he wanted to continue with the procedure. Attestation: I, the ordering provider, attest that I have discussed with the patient the benefits, risks, side-effects, alternatives, likelihood of achieving goals, and potential problems during recovery for the procedure that I have provided informed consent. Date: 04/10/2017; Time: 5:59 PM  Pre-Procedure Preparation:  Monitoring: As per clinic protocol. Respiration, ETCO2, SpO2, BP, heart rate and rhythm monitor placed and checked for adequate function Safety Precautions: Patient was assessed for positional comfort and pressure points before starting the procedure. Time-out: I initiated and conducted the "Time-out" before starting the procedure, as per protocol. The  patient was asked to participate by confirming the accuracy of the "Time Out" information. Verification of the correct person, site, and procedure were performed and confirmed by me, the nursing staff, and the patient. "Time-out" conducted as per Joint Commission's Universal Protocol (UP.01.01.01). "Time-out" Date & Time: 04/10/2017; 1211 hrs.  Description of Procedure Process:   Position: Supine Target Area: Central-port of intrathecal pump. Approach: Anterior, 90 degree angle approach. Area Prepped: Entire Area around the pump implant. Prepping solution: ChloraPrep (2% chlorhexidine gluconate and 70% isopropyl alcohol) Safety Precautions: Aspiration looking for blood  return was conducted prior to all injections. At no point did we inject any substances, as a needle was being advanced. No attempts were made at seeking any paresthesias. Safe injection practices and needle disposal techniques used. Medications properly checked for expiration dates. SDV (single dose vial) medications used. Description of the Procedure: Protocol guidelines were followed. Two nurses trained to do implant refills were present during the entire procedure. The refill medication was checked by both healthcare providers as well as the patient. The patient was included in the "Time-out" to verify the medication. The patient was placed in position. The pump was identified. The area was prepped in the usual manner. The sterile template was positioned over the pump, making sure the side-port location matched that of the pump. Both, the pump and the template were held for stability. The needle provided in the Medtronic Kit was then introduced thru the center of the template and into the central port. The pump content was aspirated and discarded volume documented. The new medication was slowly infused into the pump, thru the filter, making sure to avoid overpressure of the device. The needle was then removed and the area cleansed, making  sure to leave some of the prepping solution back to take advantage of its long term bactericidal properties. The pump was interrogated and programmed to reflect the correct medication, volume, and dosage. The program was printed and taken to the physician for approval. Once checked and signed by the physician, a copy was provided to the patient and another scanned into the EMR. Vitals:   04/10/17 1140  BP: 107/68  Pulse: (!) 116  Resp: 16  Temp: 98 F (36.7 C)  SpO2: 100%  Weight: 150 lb (68 kg)  Height: _0  (1.702 m)    Start Time: 1211 hrs. End Time: 1222 hrs. Materials & Medications: Medtronic Refill Kit Medication(s): Please see chart orders for details.  Imaging Guidance:  Type of Imaging Technique: None used Indication(s): N/A Exposure Time: No patient exposure Contrast: None used. Fluoroscopic Guidance: N/A Ultrasound Guidance: N/A Interpretation: N/A  Antibiotic Prophylaxis:  Indication(s): None identified Antibiotic given: None  Post-operative Assessment:  EBL: None Complications: No immediate post-treatment complications observed by team, or reported by patient. Note: The patient tolerated the entire procedure well. A repeat set of vitals were taken after the procedure and the patient was kept under observation following institutional policy, for this type of procedure. Post-procedural neurological assessment was performed, showing return to baseline, prior to discharge. The patient was provided with post-procedure discharge instructions, including a section on how to identify potential problems. Should any problems arise concerning this procedure, the patient was given instructions to immediately contact us, at any time, without hesitation. In any case, we plan to contact the patient by telephone for a follow-up status report regarding this interventional procedure. Comments:  No additional relevant information.  Plan of Care  Disposition: Discharge home  Discharge  Date & Time: 04/10/2017; 1250 hrs.  Physician-requested Follow-up:  Return for Pump Refill (as per pump program).  Future Appointments Date Time Provider [place]  05/01/2017 8:30 AM McGowan, Larene Beach A, PA-C [facility] None  05/01/2017 9:30 AM [facility] None  05/01/2017 9:45 AM Lequita Asal, MD [facility] None  05/01/2017 10:15 AM [facility] INFUSION CHAIR 3 [facility] None  07/31/2017 11:30 AM Milinda Pointer, MD [facility] None   Medications ordered for procedure: No orders of the defined types were placed in this encounter.  Medications administered: Mr. Jablonowski had no medications  administered during this visit.  See the medical record for exact dosing, route, and time of administration.  Lab-work, Procedure(s), & Referral(s) Ordered: Orders Placed This Encounter  Procedures  . PUMP REFILL  . PUMP REFILL  . Informed Consent Details: Transcribe to consent form and obtain patient signature  . Provider attestation of informed consent for procedure/surgical case  . Verify informed consent  . Discharge instructions  . Follow-up   Imaging Ordered: New Prescriptions   No medications on file   Primary Care Physician: Patient, No Pcp Per Location: [facility] Note by: Breelyn Icard A. Dossie Perez, M.D, DABA, DABAPM, DABPM, DABIPP, FIPP Date: 04/10/2017; Time: 1:03 PM  Disclaimer:  Medicine is not an Chief Strategy Officer. The only guarantee in medicine is that nothing is guaranteed. It is important to note that the decision to proceed with this intervention was based on the information collected from the patient. The Data and conclusions were drawn from the patient's questionnaire, the interview, and the physical examination. Because the information was provided in large part by the patient, it cannot be guaranteed that it has not been purposely or unconsciously manipulated. Every effort has been made to obtain as much relevant data as possible for  this evaluation. It is important to note that the conclusions that lead to this procedure are derived in large part from the available data. Always take into account that the treatment will also be dependent on availability of resources and existing treatment guidelines, considered by other Pain Management Practitioners as being common knowledge and practice, at the time of the intervention. For Medico-Legal purposes, it is also important to point out that variation in procedural techniques and pharmacological choices are the acceptable norm. The indications, contraindications, technique, and results of the above procedure should only be interpreted and judged by a Board-Certified Interventional Pain Specialist with extensive familiarity and expertise in the same exact procedure and technique.  Instructions provided at this appointment: There are no Patient Instructions on file for this visit.

## 2017-04-09 NOTE — Telephone Encounter (Signed)
Per VO Dr Grayland Ormond Keflex 500 mg bid times 5 days. Patient informed by leaving a VM on his phone

## 2017-04-09 NOTE — Telephone Encounter (Signed)
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 Please advise

## 2017-04-10 ENCOUNTER — Telehealth: Payer: Self-pay

## 2017-04-10 ENCOUNTER — Ambulatory Visit: Payer: Medicare Other | Attending: Pain Medicine | Admitting: Pain Medicine

## 2017-04-10 ENCOUNTER — Encounter: Payer: Self-pay | Admitting: Pain Medicine

## 2017-04-10 VITALS — BP 107/68 | HR 116 | Temp 98.0°F | Resp 16 | Ht 67.0 in | Wt 150.0 lb

## 2017-04-10 DIAGNOSIS — Z79891 Long term (current) use of opiate analgesic: Secondary | ICD-10-CM | POA: Diagnosis not present

## 2017-04-10 DIAGNOSIS — G893 Neoplasm related pain (acute) (chronic): Secondary | ICD-10-CM | POA: Insufficient documentation

## 2017-04-10 DIAGNOSIS — Z95828 Presence of other vascular implants and grafts: Secondary | ICD-10-CM

## 2017-04-10 DIAGNOSIS — S22080A Wedge compression fracture of T11-T12 vertebra, initial encounter for closed fracture: Secondary | ICD-10-CM

## 2017-04-10 DIAGNOSIS — G894 Chronic pain syndrome: Secondary | ICD-10-CM

## 2017-04-10 DIAGNOSIS — C9000 Multiple myeloma not having achieved remission: Secondary | ICD-10-CM | POA: Insufficient documentation

## 2017-04-10 DIAGNOSIS — Z451 Encounter for adjustment and management of infusion pump: Secondary | ICD-10-CM | POA: Diagnosis not present

## 2017-04-10 DIAGNOSIS — Z9841 Cataract extraction status, right eye: Secondary | ICD-10-CM | POA: Insufficient documentation

## 2017-04-10 NOTE — Telephone Encounter (Signed)
Left vm again for pt to return call and schedule follow up appt to discuss lab results.

## 2017-04-10 NOTE — Telephone Encounter (Signed)
-----   Message from Lucilla Lame, MD sent at 03/26/2017  1:14 PM EDT ----- Please have the patient come in for a follow up.

## 2017-04-10 NOTE — Progress Notes (Signed)
Safety precautions to be maintained throughout the outpatient stay will include: orient to surroundings, keep bed in low position, maintain call bell within reach at all times, provide assistance with transfer out of bed and ambulation.  

## 2017-04-15 ENCOUNTER — Other Ambulatory Visit: Payer: Self-pay | Admitting: Hematology and Oncology

## 2017-04-17 ENCOUNTER — Other Ambulatory Visit: Payer: Self-pay | Admitting: Pain Medicine

## 2017-04-17 DIAGNOSIS — M62838 Other muscle spasm: Secondary | ICD-10-CM

## 2017-04-17 MED FILL — Medication: INTRATHECAL | Qty: 1 | Status: AC

## 2017-04-19 ENCOUNTER — Telehealth: Payer: Self-pay | Admitting: Pain Medicine

## 2017-04-19 ENCOUNTER — Other Ambulatory Visit: Payer: Self-pay | Admitting: Nurse Practitioner

## 2017-04-19 DIAGNOSIS — M62838 Other muscle spasm: Secondary | ICD-10-CM

## 2017-04-19 MED ORDER — BACLOFEN 10 MG PO TABS: 10.0000 mg | ORAL_TABLET | Freq: Every day | ORAL | 3 refills | Status: DC

## 2017-04-19 NOTE — Telephone Encounter (Signed)
Patient needs to have baclofen called in to pharmacy, he forgot when he was here on 04-10-17. Please call patient when he can pick up please

## 2017-04-23 ENCOUNTER — Other Ambulatory Visit: Payer: Self-pay | Admitting: Hematology and Oncology

## 2017-04-23 DIAGNOSIS — C9001 Multiple myeloma in remission: Secondary | ICD-10-CM

## 2017-04-23 NOTE — Telephone Encounter (Signed)
Left vm again for pt to return my call to schedule a follow up appt to discuss lab results. Mailed letter requesting a call back to schedule.

## 2017-04-24 NOTE — Telephone Encounter (Signed)
Patient called to let him know that Rx for Baclofen has been escribed to pharmacy.

## 2017-04-26 ENCOUNTER — Ambulatory Visit: Payer: Medicare Other | Admitting: Gastroenterology

## 2017-04-27 DIAGNOSIS — G2581 Restless legs syndrome: Secondary | ICD-10-CM | POA: Insufficient documentation

## 2017-04-27 DIAGNOSIS — F411 Generalized anxiety disorder: Secondary | ICD-10-CM | POA: Insufficient documentation

## 2017-04-30 NOTE — Progress Notes (Signed)
  05/01/2017 9:02 AM   Logan Perez 11/16/1945 [id number]  Referring provider: Virk, Charanjit, MD No address on file  Chief Complaint  Patient presents with  . Benign Prostatic Hypertrophy    1 year follow up    HPI: Patient is a 71 year old Caucasian male who presents today for a one year follow up for BPH with LUTS.    BPH WITH LUTS His IPSS score today is 4, which is mild lower urinary tract symptomatology. He is pleased with his quality life due to his urinary symptoms.   His previous I PSS score was 5/3.  His major complaint today nocturia x 4 and frequency .  He attributes this to the increase of water intake (36 oz daily) due to his infusions for multiple myeloma.  He has had these symptoms for few months years.  He denies any dysuria, hematuria or suprapubic pain.  He currently taking tamsulosin 0.4 mg daily.  He would like to discontinue the medication.   He also denies any recent fevers, chills, nausea or vomiting.        IPSS    Row Name 05/01/17 0800         International Prostate Symptom Score   How often have you had the sensation of not emptying your bladder? Less than 1 in 5     How often have you had to urinate less than every two hours? Not at All     How often have you found you stopped and started again several times when you urinated? Not at All     How often have you found it difficult to postpone urination? Less than 1 in 5 times     How often have you had a weak urinary stream? Not at All     How often have you had to strain to start urination? Not at All     How many times did you typically get up at night to urinate? 2 Times     Total IPSS Score 4       Quality of Life due to urinary symptoms   If you were to spend the rest of your life with your urinary condition just the way it is now how would you feel about that? Pleased        Score:  1-7 Mild 8-19 Moderate 20-35 Severe    PMH: Past Medical History:  Diagnosis Date  .  Anxiety   . Atrial fibrillation (HCC)   . BPH (benign prostatic hyperplasia)   . Complication of anesthesia    BAD HEADACHE NIGHT OF FIRST CATARACT  . Compression fracture of lumbar vertebra (HCC)   . Difficulty voiding   . Dysrhythmia    A FIB  . Elevated PSA   . GERD (gastroesophageal reflux disease)   . Hearing aid worn    bilateral  . HLD (hyperlipidemia)   . HOH (hard of hearing)   . Hypertension   . Multiple myeloma (HCC)   . Neuropathy    feet. 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  . Pain    BACK  . Palpitations   . Pneumonia   . Pulmonary embolism (HCC)   . Sepsis (HCC)   . Stroke (HCC)    TIA, detected on CT scan. pt was unaware    Surgical History: Past Surgical History:  Procedure Laterality Date  . BACK SURGERY  1994  . CATARACT EXTRACTION W/PHACO Right 05/02/2016   Procedure: CATARACT EXTRACTION PHACO AND INTRAOCULAR LENS   PLACEMENT (IOC);  Surgeon: William Porfilio, MD;  Location: [facility];  Service: Ophthalmology;  Laterality: Right;  US 1.06 AP% 20.6 CDE 13.70 FLUID PACK LOT # [id number]  . CATARACT EXTRACTION W/PHACO Left 05/16/2016   Procedure: CATARACT EXTRACTION PHACO AND INTRAOCULAR LENS PLACEMENT (IOC);  Surgeon: William Porfilio, MD;  Location: [facility];  Service: Ophthalmology;  Laterality: Left;  US 01:43 AP% 19.8 CDE 20.45 FLUID PACK LOT #[id number]  . COLONOSCOPY WITH PROPOFOL N/A 03/22/2017   Procedure: COLONOSCOPY WITH PROPOFOL;  Surgeon: Wohl, Darren, MD;  Location: [facility];  Service: Endoscopy;  Laterality: N/A;  has port  . ESOPHAGOGASTRODUODENOSCOPY (EGD) WITH PROPOFOL  03/22/2017   Procedure: ESOPHAGOGASTRODUODENOSCOPY (EGD) WITH PROPOFOL;  Surgeon: Wohl, Darren, MD;  Location: [facility];  Service: Endoscopy;;  . KNEE ARTHROSCOPY Left 1992  . LIMBAL STEM CELL TRANSPLANT    . PAIN PUMP IMPLANTATION  2012  . PORTA CATH INSERTION N/A 12/04/2016   Procedure: Porta Cath Insertion;  Surgeon: Jason S Dew, MD;  Location: [facility];   Service: Cardiovascular;  Laterality: N/A;  . stem cell implant  2008   [facility]    Home Medications:  Allergies as of 05/01/2017      Reactions   Azithromycin Diarrhea   Possible cause of C. Diff   Bee Pollen    Other reaction(s): Other (See Comments) Sneezing, watery eyes, runny nose   Pollen Extract Other (See Comments)   Sneezing, watery eyes, runny nose   Zometa [zoledronic Acid] Other (See Comments)   ONG- Osteonecrosis of the jaw   Rivaroxaban Rash      Medication List       Accurate as of 05/01/17  9:02 AM. Always use your most recent med list.          acetaminophen 500 MG tablet Commonly known as:  TYLENOL Take 1,000 mg by mouth every 8 (eight) hours as needed for mild pain or headache.   ALPRAZolam 0.25 MG tablet Commonly known as:  XANAX TAKE 1 TABLET BY MOUTH EVERY NIGHT AT BEDTIME, AS NEEDED FOR ANXIETY   baclofen 10 MG tablet Commonly known as:  LIORESAL Take 1 tablet (10 mg total) by mouth at bedtime.   calcium citrate-vitamin D 315-200 MG-UNIT tablet Commonly known as:  CITRACAL+D Take 1 tablet by mouth daily.   cephALEXin 500 MG capsule Commonly known as:  KEFLEX Take 1 capsule (500 mg total) by mouth 2 (two) times daily.   cetirizine 10 MG tablet Commonly known as:  ZYRTEC Take 10 mg by mouth daily as needed for allergies.   DARZALEX IV Inject into the vein.   dexamethasone 4 MG tablet Commonly known as:  DECADRON Take 4 tabs PO 1 hr prior to infusion, then take 1 tab on day 2 and day 3   diltiazem 120 MG 24 hr capsule Commonly known as:  CARDIZEM CD Take by mouth.   diltiazem 60 MG tablet Commonly known as:  CARDIZEM Take 60 mg by mouth as needed (raised heart rate).   diphenhydrAMINE 25 MG tablet Commonly known as:  BENADRYL Take by mouth.   ELIQUIS 5 MG Tabs tablet Generic drug:  apixaban TAKE 1 TABLET(5 MG) BY MOUTH TWICE DAILY   ENSURE Take 237 mLs by mouth daily.   fluticasone 50 MCG/ACT nasal spray Commonly known as:   FLONASE Place 1 spray into the nose daily as needed for allergies.   GERITOL PO Take 1 tablet by mouth daily.   lidocaine-prilocaine cream Commonly known   as:  EMLA Apply 1 application topically as needed.   loperamide 2 MG capsule Commonly known as:  IMODIUM Take 2-4 mg by mouth as needed for diarrhea or loose stools. Reported on 04/26/2016   lovastatin 20 MG tablet Commonly known as:  MEVACOR Take 20 mg by mouth every evening.   montelukast 10 MG tablet Commonly known as:  SINGULAIR TAKE 1 TABLET BY MOUTH ONCE A DAY AS DIRECTED.   naproxen 250 MG tablet Commonly known as:  NAPROSYN Take 250 mg by mouth.   naproxen sodium 220 MG tablet Commonly known as:  ANAPROX Take 220-440 mg by mouth 2 (two) times daily as needed (pain).   omeprazole 20 MG capsule Commonly known as:  PRILOSEC Take 20 mg by mouth daily.   PAIN MANAGEMENT IT PUMP REFILL 1 each by Intrathecal route once. Medication: PF Fentanyl 1,500.0 mcg/ml PF Bupivicaine 30.0 mg/ml PF Clonidine 300.0mcg/ml Total Volume: 40 ml Needed by 04-10-17 @ 1045   potassium chloride SA 20 MEQ tablet Commonly known as:  K-DUR,KLOR-CON TAKE 1 TABLET(20 MEQ) BY MOUTH DAILY   prochlorperazine 10 MG tablet Commonly known as:  COMPAZINE Take 10 mg by mouth every 6 (six) hours as needed for nausea or vomiting.   ranitidine 150 MG tablet Commonly known as:  ZANTAC Take 150 mg by mouth at bedtime.   tamsulosin 0.4 MG Caps capsule Commonly known as:  FLOMAX Take 1 capsule (0.4 mg total) by mouth daily.   valACYclovir 500 MG tablet Commonly known as:  VALTREX TAKE 1 TABLET BY MOUTH DAILY   VITAMIN B-12 PO Take by mouth daily.   VITAMIN D3 PO Take by mouth daily.       Allergies:  Allergies  Allergen Reactions  . Azithromycin Diarrhea    Possible cause of C. Diff  . Bee Pollen     Other reaction(s): Other (See Comments) Sneezing, watery eyes, runny nose  . Pollen Extract Other (See Comments)    Sneezing, watery  eyes, runny nose  . Zometa [Zoledronic Acid] Other (See Comments)    ONG- Osteonecrosis of the jaw   . Rivaroxaban Rash    Family History: Family History  Problem Relation Age of Onset  . Cancer Father        throat  . Kidney disease Sister   . Stroke Unknown   . Bladder Cancer Neg Hx   . Prostate cancer Neg Hx   . Kidney cancer Neg Hx     Social History:  reports that he quit smoking about 41 years ago. His smoking use included Cigarettes. He has a 30.00 pack-year smoking history. He has never used smokeless tobacco. He reports that he does not drink alcohol or use drugs.  ROS: UROLOGY Frequent Urination?: Yes Hard to postpone urination?: No Burning/pain with urination?: No Get up at night to urinate?: Yes Leakage of urine?: No Urine stream starts and stops?: No Trouble starting stream?: No Do you have to strain to urinate?: No Blood in urine?: No Urinary tract infection?: No Sexually transmitted disease?: No Injury to kidneys or bladder?: No Painful intercourse?: No Weak stream?: No Erection problems?: No Penile pain?: No  Gastrointestinal Nausea?: No Vomiting?: No Indigestion/heartburn?: No Diarrhea?: Yes Constipation?: No  Constitutional Fever: No Night sweats?: No Weight loss?: No Fatigue?: No  Skin Skin rash/lesions?: No Itching?: No  Eyes Blurred vision?: No Double vision?: No  Ears/Nose/Throat Sore throat?: No Sinus problems?: No  Hematologic/Lymphatic Swollen glands?: No Easy bruising?: No  Cardiovascular Leg swelling?: No Chest   pain?: No  Respiratory Cough?: No Shortness of breath?: No  Endocrine Excessive thirst?: No  Musculoskeletal Back pain?: Yes Joint pain?: No  Neurological Headaches?: No Dizziness?: No  Psychologic Depression?: No Anxiety?: No  Physical Exam: BP 103/69   Pulse (!) 114   Ht 5' 7" (1.702 m)   Wt 153 lb 4.8 oz (69.5 kg)   BMI 24.01 kg/m   Constitutional: Well nourished. Alert and  oriented, No acute distress. HEENT: [place] AT, moist mucus membranes. Trachea midline, no masses. Cardiovascular: No clubbing, cyanosis, or edema. Respiratory: Normal respiratory effort, no increased work of breathing. GI: Abdomen is soft, non tender, non distended, no abdominal masses. Liver and spleen not palpable.  No hernias appreciated.  Stool sample for occult testing is not indicated.   GU: No CVA tenderness.  No bladder fullness or masses.  Patient with circumcised phallus.  Urethral meatus is patent.  No penile discharge. No penile lesions or rashes. Scrotum without lesions, cysts, rashes and/or edema.  Testicles are located scrotally bilaterally. They are atrophic.  No masses are appreciated in the testicles. Left and right epididymis are normal. Rectal: Patient with  normal sphincter tone. Anus and perineum without scarring or rashes. No rectal masses are appreciated. Prostate is approximately 50 grams, no nodules are appreciated. Seminal vesicles are normal. Skin: No rashes, bruises or suspicious lesions. Lymph: No cervical or inguinal adenopathy. Neurologic: Grossly intact, no focal deficits, moving all 4 extremities. Psychiatric: Normal mood and affect.  Laboratory Data: Lab Results  Component Value Date   WBC 8.7 04/06/2017   HGB 10.7 (L) 04/06/2017   HCT 29.7 (L) 04/06/2017   MCV 101.7 (H) 04/06/2017   PLT 150 04/06/2017    Lab Results  Component Value Date   CREATININE 1.39 (H) 04/06/2017    PSA History:    2.9 ng/mL on 04/30/2012    1.9 ng/mL on 05/01/2013    1.5 ng/mL on 04/30/2014  Lab Results  Component Value Date   AST 19 04/06/2017   Lab Results  Component Value Date   ALT 14 (L) 04/06/2017   I have reviewed the labs   Assessment & Plan:    1. BPH with LUTS  - IPSS score is 4/1, it is improving  - Continue conservative management, avoiding bladder irritants  - Continue tamsulosin 0.4 mg daily, restart if symptoms worsen  - RTC in 12 months for IPSS  and exam   Return in about 1 year (around 05/01/2018) for IPSS and exam.  These notes generated with voice recognition software. I apologize for typographical errors.  SHANNON MCGOWAN, PA-C  [facility] [address], [address] [place], [place] [address] [phone]  

## 2017-05-01 ENCOUNTER — Encounter: Payer: Self-pay | Admitting: Urology

## 2017-05-01 ENCOUNTER — Ambulatory Visit (INDEPENDENT_AMBULATORY_CARE_PROVIDER_SITE_OTHER): Payer: Medicare Other | Admitting: Urology

## 2017-05-01 ENCOUNTER — Ambulatory Visit
Admission: RE | Admit: 2017-05-01 | Discharge: 2017-05-01 | Disposition: A | Discharge status: Home / Self Care | Payer: Medicare Other | Source: Ambulatory Visit | Attending: Hematology and Oncology | Admitting: Hematology and Oncology

## 2017-05-01 ENCOUNTER — Inpatient Hospital Stay: Payer: Medicare Other | Attending: Hematology and Oncology

## 2017-05-01 ENCOUNTER — Encounter: Payer: Self-pay | Admitting: Hematology and Oncology

## 2017-05-01 ENCOUNTER — Inpatient Hospital Stay (HOSPITAL_BASED_OUTPATIENT_CLINIC_OR_DEPARTMENT_OTHER): Payer: Medicare Other | Admitting: Hematology and Oncology

## 2017-05-01 ENCOUNTER — Inpatient Hospital Stay: Payer: Medicare Other

## 2017-05-01 VITALS — BP 103/69 | HR 114 | Ht 67.0 in | Wt 153.3 lb

## 2017-05-01 VITALS — BP 130/82 | HR 88 | Temp 97.5°F | Resp 18 | Wt 153.1 lb

## 2017-05-01 DIAGNOSIS — R05 Cough: Secondary | ICD-10-CM | POA: Insufficient documentation

## 2017-05-01 DIAGNOSIS — Z5112 Encounter for antineoplastic immunotherapy: Secondary | ICD-10-CM

## 2017-05-01 DIAGNOSIS — Z978 Presence of other specified devices: Secondary | ICD-10-CM | POA: Insufficient documentation

## 2017-05-01 DIAGNOSIS — D638 Anemia in other chronic diseases classified elsewhere: Secondary | ICD-10-CM

## 2017-05-01 DIAGNOSIS — G8929 Other chronic pain: Secondary | ICD-10-CM | POA: Diagnosis not present

## 2017-05-01 DIAGNOSIS — Z79899 Other long term (current) drug therapy: Secondary | ICD-10-CM | POA: Diagnosis not present

## 2017-05-01 DIAGNOSIS — Z7901 Long term (current) use of anticoagulants: Secondary | ICD-10-CM | POA: Insufficient documentation

## 2017-05-01 DIAGNOSIS — C9002 Multiple myeloma in relapse: Secondary | ICD-10-CM | POA: Insufficient documentation

## 2017-05-01 DIAGNOSIS — M549 Dorsalgia, unspecified: Secondary | ICD-10-CM

## 2017-05-01 DIAGNOSIS — H9190 Unspecified hearing loss, unspecified ear: Secondary | ICD-10-CM | POA: Insufficient documentation

## 2017-05-01 DIAGNOSIS — K219 Gastro-esophageal reflux disease without esophagitis: Secondary | ICD-10-CM | POA: Insufficient documentation

## 2017-05-01 DIAGNOSIS — C9001 Multiple myeloma in remission: Secondary | ICD-10-CM

## 2017-05-01 DIAGNOSIS — Z87891 Personal history of nicotine dependence: Secondary | ICD-10-CM | POA: Insufficient documentation

## 2017-05-01 DIAGNOSIS — Z87311 Personal history of (healed) other pathological fracture: Secondary | ICD-10-CM | POA: Insufficient documentation

## 2017-05-01 DIAGNOSIS — I10 Essential (primary) hypertension: Secondary | ICD-10-CM

## 2017-05-01 DIAGNOSIS — Z8673 Personal history of transient ischemic attack (TIA), and cerebral infarction without residual deficits: Secondary | ICD-10-CM

## 2017-05-01 DIAGNOSIS — N401 Enlarged prostate with lower urinary tract symptoms: Secondary | ICD-10-CM | POA: Diagnosis not present

## 2017-05-01 DIAGNOSIS — F419 Anxiety disorder, unspecified: Secondary | ICD-10-CM | POA: Insufficient documentation

## 2017-05-01 DIAGNOSIS — E785 Hyperlipidemia, unspecified: Secondary | ICD-10-CM | POA: Insufficient documentation

## 2017-05-01 DIAGNOSIS — R197 Diarrhea, unspecified: Secondary | ICD-10-CM

## 2017-05-01 DIAGNOSIS — N400 Enlarged prostate without lower urinary tract symptoms: Secondary | ICD-10-CM | POA: Insufficient documentation

## 2017-05-01 DIAGNOSIS — Z9484 Stem cells transplant status: Secondary | ICD-10-CM | POA: Diagnosis not present

## 2017-05-01 DIAGNOSIS — I4891 Unspecified atrial fibrillation: Secondary | ICD-10-CM

## 2017-05-01 DIAGNOSIS — R918 Other nonspecific abnormal finding of lung field: Secondary | ICD-10-CM | POA: Diagnosis not present

## 2017-05-01 DIAGNOSIS — Z86711 Personal history of pulmonary embolism: Secondary | ICD-10-CM | POA: Diagnosis not present

## 2017-05-01 DIAGNOSIS — N138 Other obstructive and reflux uropathy: Secondary | ICD-10-CM

## 2017-05-01 DIAGNOSIS — R059 Cough, unspecified: Secondary | ICD-10-CM

## 2017-05-01 DIAGNOSIS — R439 Unspecified disturbances of smell and taste: Secondary | ICD-10-CM

## 2017-05-01 DIAGNOSIS — Z800 Family history of malignant neoplasm of digestive organs: Secondary | ICD-10-CM | POA: Insufficient documentation

## 2017-05-01 LAB — COMPREHENSIVE METABOLIC PANEL
ALT: 14 U/L — ABNORMAL LOW (ref 17–63)
AST: 20 U/L (ref 15–41)
Albumin: 3.7 g/dL (ref 3.5–5.0)
Alkaline Phosphatase: 71 U/L (ref 38–126)
Anion gap: 9 (ref 5–15)
BUN: 26 mg/dL — ABNORMAL HIGH (ref 6–20)
CO2: 23 mmol/L (ref 22–32)
Calcium: 9.2 mg/dL (ref 8.9–10.3)
Chloride: 105 mmol/L (ref 101–111)
Creatinine, Ser: 1.33 mg/dL — ABNORMAL HIGH (ref 0.61–1.24)
GFR calc Af Amer: >60 mL/min (ref >60)
GFR calc non Af Amer: 52 mL/min — ABNORMAL LOW (ref >60)
Glucose, Bld: 135 mg/dL — ABNORMAL HIGH (ref 65–99)
Potassium: 4.2 mmol/L (ref 3.5–5.1)
Sodium: 137 mmol/L (ref 135–145)
Total Bilirubin: 0.4 mg/dL (ref 0.3–1.2)
Total Protein: 6.1 g/dL — ABNORMAL LOW (ref 6.5–8.1)

## 2017-05-01 LAB — CBC WITH DIFFERENTIAL/PLATELET
Basophils Absolute: 0 10*3/uL (ref 0–0.1)
Basophils Relative: 0 %
Eosinophils Absolute: 0.2 10*3/uL (ref 0–0.7)
Eosinophils Relative: 3 %
HCT: 30.5 % — ABNORMAL LOW (ref 40.0–52.0)
Hemoglobin: 10.8 g/dL — ABNORMAL LOW (ref 13.0–18.0)
Lymphocytes Relative: 8 %
Lymphs Abs: 0.6 10*3/uL — ABNORMAL LOW (ref 1.0–3.6)
MCH: 35.6 pg — ABNORMAL HIGH (ref 26.0–34.0)
MCHC: 35.3 g/dL (ref 32.0–36.0)
MCV: 100.6 fL — ABNORMAL HIGH (ref 80.0–100.0)
Monocytes Absolute: 0.4 10*3/uL (ref 0.2–1.0)
Monocytes Relative: 5 %
Neutro Abs: 6.9 10*3/uL — ABNORMAL HIGH (ref 1.4–6.5)
Neutrophils Relative %: 84 %
Platelets: 159 10*3/uL (ref 150–440)
RBC: 3.03 MIL/uL — ABNORMAL LOW (ref 4.40–5.90)
RDW: 15 % — ABNORMAL HIGH (ref 11.5–14.5)
WBC: 8.2 10*3/uL (ref 3.8–10.6)

## 2017-05-01 LAB — MAGNESIUM: Magnesium: 1.8 mg/dL (ref 1.7–2.4)

## 2017-05-01 MED ORDER — SODIUM CHLORIDE 0.9 % IV SOLN
Freq: Once | INTRAVENOUS | Status: AC
  Administered 2017-05-01: 12:00:00 via INTRAVENOUS
  Filled 2017-05-01: qty 1000

## 2017-05-01 MED ORDER — TAMSULOSIN HCL 0.4 MG PO CAPS: 0.4000 mg | ORAL_CAPSULE | Freq: Every day | ORAL | 4 refills | Status: DC

## 2017-05-01 MED ORDER — HEPARIN SOD (PORK) LOCK FLUSH 100 UNIT/ML IV SOLN
500.0000 [IU] | Freq: Once | INTRAVENOUS | Status: AC | PRN
  Administered 2017-05-01: 500 [IU]

## 2017-05-01 MED ORDER — SODIUM CHLORIDE 0.9 % IV SOLN
1000.0000 mg | Freq: Once | INTRAVENOUS | Status: AC
  Administered 2017-05-01: 1000 mg via INTRAVENOUS
  Filled 2017-05-01: qty 50

## 2017-05-01 MED ORDER — DIPHENHYDRAMINE HCL 25 MG PO CAPS
50.0000 mg | ORAL_CAPSULE | Freq: Once | ORAL | Status: AC
  Administered 2017-05-01: 50 mg via ORAL
  Filled 2017-05-01: qty 2

## 2017-05-01 MED ORDER — ONDANSETRON 8 MG PO TBDP
8.0000 mg | ORAL_TABLET | Freq: Once | ORAL | Status: AC
  Administered 2017-05-01: 8 mg via ORAL
  Filled 2017-05-01: qty 1

## 2017-05-01 MED ORDER — ACETAMINOPHEN 325 MG PO TABS
650.0000 mg | ORAL_TABLET | Freq: Once | ORAL | Status: AC
  Administered 2017-05-01: 650 mg via ORAL
  Filled 2017-05-01: qty 2

## 2017-05-01 NOTE — Progress Notes (Signed)
[facility] day:  05/01/17  Chief Complaint: Logan Perez is a 71 y.o. 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  HPI: The patient was last seen in the medical oncology clinic on 03/27/2017.  At that time, platelet count was unexpectantly 99,000.  Prior platelet counts ranged between 141,000 - 153,000.  He denied any new medications.  Etiology was unclear.  Decision was made to postpone treatment x 1 week.  He received daratumumab on 04/06/2017.  Platelet count was 150,000.   He was seen by Dr. Renetta Chalk at [facility] on 04/17/2017.  He started his vaccinations (DTaP-Pediatric triple vaccine, Hep B- Pediatrix triple vaccine, Haemophilus influenza B (Hib), inactivated polio virus (IPV), pneumococcal conjugate vaccine 13-valent (PCV 13)).  Recommendation was for Shingrix vaccine to be given in [place] (sent to local pharmacy per insurance).  Second dose of Shingrix in 2 months.  Follow-up is in 3 months to continue vaccinations.  SPEP on 04/17/2017 revealed no monoclonal protein but an irregularity in the gamma region which may represent monoclonal protein.  Immunofixation revealed a monoclonal component type is IgG kappa. The concentration was too low to accurately quantify.  Kappa free light chains were 0.66, lambda free light chains 1.29 with a ratio of 0.51.   Symptomatically, he notes sensitivity in his hands for the past 3 months.  Symptoms are mild (< 5 out of 100).  He notes a clear cough.  He denies any fever or shortness of breath.  His grandson is coughing.  He is using 2 Imodium/day.  He has not gotten the Shingrix vaccine.   Past Medical History:  Diagnosis Date  . Anxiety   . Atrial fibrillation ([place])   . BPH (benign prostatic hyperplasia)   . Complication of anesthesia    BAD  HEADACHE NIGHT OF FIRST CATARACT  . Compression fracture of lumbar vertebra ([place])   . Difficulty voiding   . Dysrhythmia    A FIB  . Elevated PSA   . GERD (gastroesophageal reflux disease)   . Hearing aid worn    bilateral  . HLD (hyperlipidemia)   . HOH (hard of hearing)   . Hypertension   . Multiple myeloma ([place])   . Neuropathy    feet. 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  . Pain    BACK  . Palpitations   . Pneumonia   . Pulmonary embolism ([place])   . Sepsis ([place])   . Stroke [facility])    TIA, detected on CT scan. pt was unaware    Past Surgical History:  Procedure Laterality Date  . BACK SURGERY  1994  . CATARACT EXTRACTION W/PHACO Right 05/02/2016   Procedure: CATARACT EXTRACTION PHACO AND INTRAOCULAR LENS PLACEMENT (IOC);  Surgeon: Birder Robson, MD;  Location: [facility] ORS;  Service: Ophthalmology;  Laterality: Right;  [place] 1.06 AP% 20.6 CDE 13.70 FLUID PACK LOT # [id number]  . CATARACT EXTRACTION W/PHACO Left 05/16/2016   Procedure: CATARACT EXTRACTION PHACO AND INTRAOCULAR LENS PLACEMENT (IOC);  Surgeon: Birder Robson, MD;  Location: [facility] ORS;  Service: Ophthalmology;  Laterality: Left;  [place] 01:43 AP% 19.8 CDE 20.45 FLUID PACK LOT #[id number] H  . COLONOSCOPY WITH PROPOFOL N/A 03/22/2017   Procedure: COLONOSCOPY WITH PROPOFOL;  Surgeon: Lucilla Lame, MD;  Location: [place];  Service: Endoscopy;  Laterality: N/A;  has port  . ESOPHAGOGASTRODUODENOSCOPY (  EGD) WITH PROPOFOL  03/22/2017   Procedure: ESOPHAGOGASTRODUODENOSCOPY (EGD) WITH PROPOFOL;  Surgeon: Lucilla Lame, MD;  Location: [place];  Service: Endoscopy;;  . KNEE ARTHROSCOPY Left 1992  . LIMBAL STEM CELL TRANSPLANT    . PAIN PUMP IMPLANTATION  2012  . PORTA CATH INSERTION N/A 12/04/2016   Procedure: Glori Luis Cath Insertion;  Surgeon: Algernon Huxley, MD;  Location: [place] CV LAB;  Service: Cardiovascular;  Laterality: N/A;  . stem cell implant  2008   [facility]    Family History  Problem Relation Age of Onset  .  Cancer Father        throat  . Kidney disease Sister   . Stroke Unknown   . Bladder Cancer Neg Hx   . Prostate cancer Neg Hx   . Kidney cancer Neg Hx     Social History:  reports that he quit smoking about 41 years ago. His smoking use included Cigarettes. He has a 30.00 pack-year smoking history. He has never used smokeless tobacco. He reports that he does not drink alcohol or use drugs.  He has a wire haired dachshund.  He lives in [place].  His wife's name is [name].  The patient is alone today.  Allergies:  Allergies  Allergen Reactions  . Azithromycin Diarrhea    Possible cause of C. Diff  . Bee Pollen     Other reaction(s): Other (See Comments) Sneezing, watery eyes, runny nose  . Pollen Extract Other (See Comments)    Sneezing, watery eyes, runny nose  . Zometa [Zoledronic Acid] Other (See Comments)    ONG- Osteonecrosis of the jaw   . Rivaroxaban Rash    Current Medications: Current Outpatient Prescriptions  Medication Sig Dispense Refill  . acetaminophen (TYLENOL) 500 MG tablet Take 1,000 mg by mouth every 8 (eight) hours as needed for mild pain or headache.     . ALPRAZolam (XANAX) 0.25 MG tablet TAKE 1 TABLET BY MOUTH EVERY NIGHT AT BEDTIME, AS NEEDED FOR ANXIETY 30 tablet 0  . baclofen (LIORESAL) 10 MG tablet Take 1 tablet (10 mg total) by mouth at bedtime. 30 each 3  . calcium citrate-vitamin D (CITRACAL+D) 315-200 MG-UNIT per tablet Take 1 tablet by mouth daily.     . cephALEXin (KEFLEX) 500 MG capsule Take 1 capsule (500 mg total) by mouth 2 (two) times daily. 10 capsule 0  . cetirizine (ZYRTEC) 10 MG tablet Take 10 mg by mouth daily as needed for allergies.     . Cholecalciferol (VITAMIN D3 PO) Take by mouth daily.    . Cyanocobalamin (VITAMIN B-12 PO) Take by mouth daily.    . Daratumumab (DARZALEX IV) Inject into the vein.    [name] dexamethasone (DECADRON) 4 MG tablet Take 4 tabs PO 1 hr prior to infusion, then take 1 tab on day 2 and day 3 30 tablet 1  .  diltiazem (CARDIZEM CD) 120 MG 24 hr capsule Take by mouth.    . diltiazem (CARDIZEM) 60 MG tablet Take 60 mg by mouth as needed (raised heart rate).     . diphenhydrAMINE (BENADRYL) 25 MG tablet Take by mouth.    Arne Cleveland 5 MG TABS tablet TAKE 1 TABLET(5 MG) BY MOUTH TWICE DAILY 60 tablet 0  . ENSURE (ENSURE) Take 237 mLs by mouth daily.    . fluticasone (FLONASE) 50 MCG/ACT nasal spray Place 1 spray into the nose daily as needed for allergies.     . Iron-Vitamins (GERITOL PO) Take 1 tablet by  mouth daily.     [name] lidocaine-prilocaine (EMLA) cream Apply 1 application topically as needed. 30 g 0  . loperamide (IMODIUM) 2 MG capsule Take 2-4 mg by mouth as needed for diarrhea or loose stools. Reported on 04/26/2016    . lovastatin (MEVACOR) 20 MG tablet Take 20 mg by mouth every evening.     . montelukast (SINGULAIR) 10 MG tablet TAKE 1 TABLET BY MOUTH ONCE A DAY AS DIRECTED. (Patient taking differently: Take 77ms daily the day before the infusion, the day of, and the 2 days after infusion) 30 tablet 0  . naproxen (NAPROSYN) 250 MG tablet Take 250 mg by mouth.    . naproxen sodium (ANAPROX) 220 MG tablet Take 220-440 mg by mouth 2 (two) times daily as needed (pain).    .Marland Kitchenomeprazole (PRILOSEC) 20 MG capsule Take 20 mg by mouth daily.     . potassium chloride SA (K-DUR,KLOR-CON) 20 MEQ tablet TAKE 1 TABLET(20 MEQ) BY MOUTH DAILY 90 tablet 0  . prochlorperazine (COMPAZINE) 10 MG tablet Take 10 mg by mouth every 6 (six) hours as needed for nausea or vomiting.    . ranitidine (ZANTAC) 150 MG tablet Take 150 mg by mouth at bedtime.     . tamsulosin (FLOMAX) 0.4 MG CAPS capsule Take 1 capsule (0.4 mg total) by mouth daily. 90 capsule 4  . valACYclovir (VALTREX) 500 MG tablet TAKE 1 TABLET BY MOUTH DAILY 30 tablet 0  . PAIN MANAGEMENT IT PUMP REFILL 1 each by Intrathecal route once. Medication: PF Fentanyl 1,500.0 mcg/ml PF Bupivicaine 30.0 mg/ml PF Clonidine 300.057m/ml Total Volume: 40 ml Needed by  04-10-17 @ 1045 1 each 0   No current facility-administered medications for this visit.     Review of Systems:  GENERAL: Feels "good".  Energy level normal.  No fevers or sweats.  Weight up 1 pound.  PERFORMANCE STATUS (ECOG): 1 HEENT:  Cataracts s/p surgery.  Altered taste sensation.  No sore throat, mouth sores or tenderness. Lungs:  No shortness of breath.  Clear cough (see HPI).  No hemoptysis. Cardiac:  No chest pain, palpitations, orthopnea, or PND. GI:  Appetite good. Diarrhea, managaed (2 Imodium/day).  No vomiting, constipation, melena or hematochezia. GU:  No urgency, frequency, dysuria, or hematuria. Musculoskeletal:  Compression fracture of back on a pain pump. No muscle tenderness. Extremities:  No pain or swelling. Skin:  Fragile skin.  No rashes or skin changes. Neuro:  Little tremor (chronic).  Neuropathy in feet.  Sensitivity in hands (see HPI).  No headache, weakness, balance or coordination issues. Endocrine:  No diabetes, thyroid issues, hot flashes or night sweats. Psych:  No mood changes, depression or anxiety. Pain:  Chronic back pain (pain well managed with pump). Review of systems:  All other systems reviewed and found to be negative.   Physical Exam: Blood pressure 130/82, pulse 88, temperature (!) 97.5 F (36.4 C), temperature source Tympanic, resp. rate 18, weight 153 lb 1 oz (69.4 kg). GENERAL:  Thin elderly gentleman sitting comfortably in the exam room in no acute distress.  MENTAL STATUS:  Alert and oriented to person, place and time. HEAD:  Short brown hair.  Graying goatee.  Normocephalic, atraumatic, face symmetric, no Cushingoid features. EYES:  Glasses.  Blue eyes s/p cataract surgery.  Pupils equal round and reactive to light and accomodation.  No conjunctivitis or scleral icterus. ENT:  Hearing aide.  Oropharynx clear without lesion.  Hearing aide.  Tongue normal. Mucous membranes moist.  RESPIRATORY:  Squeak  in RLL.  Otherwise, clear to auscultation  without rales, wheezes or rhonchi. CARDIOVASCULAR:  Regular rate and rhythm without murmur, rub or gallop. ABDOMEN:  RUQ pain pump.  Soft, non-tender, with active bowel sounds, and no hepatosplenomegaly.  No masses. SKIN:  No rashes, ulcers or lesions. EXTREMITIES: Trace ankle edema.  No skin discoloration or tenderness.  No palpable cords. LYMPH NODES: No palpable cervical, supraclavicular, axillary or inguinal adenopathy  NEUROLOGICAL: Intention tremor. PSYCH:  Appropriate.    Appointment on 05/01/2017  Component Date Value Ref Range Status  . WBC 05/01/2017 8.2  3.8 - 10.6 K/uL Final  . RBC 05/01/2017 3.03* 4.40 - 5.90 MIL/uL Final  . Hemoglobin 05/01/2017 10.8* 13.0 - 18.0 g/dL Final  . HCT 05/01/2017 30.5* 40.0 - 52.0 % Final  . MCV 05/01/2017 100.6* 80.0 - 100.0 fL Final  . MCH 05/01/2017 35.6* 26.0 - 34.0 pg Final  . MCHC 05/01/2017 35.3  32.0 - 36.0 g/dL Final  . RDW 05/01/2017 15.0* 11.5 - 14.5 % Final  . Platelets 05/01/2017 159  150 - 440 K/uL Final  . Neutrophils Relative % 05/01/2017 84  % Final  . Neutro Abs 05/01/2017 6.9* 1.4 - 6.5 K/uL Final  . Lymphocytes Relative 05/01/2017 8  % Final  . Lymphs Abs 05/01/2017 0.6* 1.0 - 3.6 K/uL Final  . Monocytes Relative 05/01/2017 5  % Final  . Monocytes Absolute 05/01/2017 0.4  0.2 - 1.0 K/uL Final  . Eosinophils Relative 05/01/2017 3  % Final  . Eosinophils Absolute 05/01/2017 0.2  0 - 0.7 K/uL Final  . Basophils Relative 05/01/2017 0  % Final  . Basophils Absolute 05/01/2017 0.0  0 - 0.1 K/uL Final  . Sodium 05/01/2017 137  135 - 145 mmol/L Final  . Potassium 05/01/2017 4.2  3.5 - 5.1 mmol/L Final  . Chloride 05/01/2017 105  101 - 111 mmol/L Final  . CO2 05/01/2017 23  22 - 32 mmol/L Final  . Glucose, Bld 05/01/2017 135* 65 - 99 mg/dL Final  . BUN 05/01/2017 26* 6 - 20 mg/dL Final  . Creatinine, Ser 05/01/2017 1.33* 0.61 - 1.24 mg/dL Final  . Calcium 05/01/2017 9.2  8.9 - 10.3 mg/dL Final  . Total Protein 05/01/2017  6.1* 6.5 - 8.1 g/dL Final  . Albumin 05/01/2017 3.7  3.5 - 5.0 g/dL Final  . AST 05/01/2017 20  15 - 41 U/L Final  . ALT 05/01/2017 14* 17 - 63 U/L Final  . Alkaline Phosphatase 05/01/2017 71  38 - 126 U/L Final  . Total Bilirubin 05/01/2017 0.4  0.3 - 1.2 mg/dL Final  . GFR calc non Af Amer 05/01/2017 52* >60 mL/min Final  . GFR calc Af Amer 05/01/2017 >60  >60 mL/min Final   Comment: (NOTE) The eGFR has been calculated using the CKD EPI equation. This calculation has not been validated in all clinical situations. eGFR's persistently <60 mL/min signify possible Chronic Kidney Disease.   . Anion gap 05/01/2017 9  5 - 15 Final  . Magnesium 05/01/2017 1.8  1.7 - 2.4 mg/dL Final    Assessment:  Logan Perez is a 71 y.o. male with stage III mutiple myeloma.  He initially presented with progressive back pain beginning in 12/2006.  MRI revealed "spots and compression fractures".  He began Velcade, thalidomide, and Decadron.  In 08/2007, he underwent high dose chemotherapy and autologous stem cell transplant.  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  He recurred with a rising M-spike (2.7) with  repeat M spike (1.7 gm/dl) in 03/2010.  He was initially treated with Velcade (02/08/2010 - 05/10/2010).  He then began Revlimid (15 mg 3 weeks on/1 week off) and Decadron (40 mg on day 1, 8, 15, 22).  Because of significant side effect with Decadron his dose was decreased to 10 mg once a week in 07/2010.    He was on maintenance Revlimid. Revlimid was initially10 mg 3 weeks on/1 week off. This was changed to 10 mg 2 weeks on/2 weeks off secondary to right nipple tenderness. His dose was increased to 10 mg 3 weeks on/1 week off with Decadron 10 mg a week (on Sundays) and then Revlamid 15 mg 3 weeks on and 1 week off with Decadron on Sundays.  He began Pomalyst 4 mg 3 weeks on/1 week off with Decadron on 08/27/2015.  Over the past year his SPEP has revealed no monoclonal  protein (04/21/2015) and 0.5 gm/dL on 09/22/2015 and 10/20/2015. M spike was 0.1 on 02/02/2016, 03/01/2016, 03/29/2016, 04/26/2016, and 05/24/2016.  M spike was 0 on 10/11/2016, 11/28/2016, 02/05/2017, and 03/21/2017.  Free light chains have been monitored. Kappa free light chains were 18.54 on 11/21/2013, 18.37 on 02/20/2014, 18.93 on 05/22/2014, 32.58 (high; normal ratio 1.27) on 08/21/2014, 51.53 (high; elevated ratio of 2.12) on 11/13/2014, 28.08 (ratio 1.73) on 12/09/2014, 23.71 (ratio 2.17) on 01/18/2015, 92.93 (ratio 9.49) on 04/21/2015, 93.44 (ratio 10.28) on 05/26/2015, 255.45 (ratio of 24.05) on 07/14/2015, 373.89 (ratio 48.31) on 08/04/2015, 474.33 (ratio 70.58) on 08/25/2015, 450.76 (ratio 55.44) on 09/22/2015, 453.4 (ratio 59.89) on 10/20/2015, 58.26 (ratio of > 40.74) on 02/02/2016, 62.67 (ratio of 37.98) on 03/01/2016, 75.7 (ratio > 50.47) on 03/29/2016, 79.7 (ratio 53.13) on 04/26/2016, 108.6 (ratio > 72.4) on 05/24/2016, 8.3 (1.46 ratio) on 10/11/2016, 6.7 (0.81 ratio) on 02/05/2017, and 7.8 (1.01 ratio) on 03/21/2017.  Bone survey on 12/08/2014 was stable.  Bone survey on 10/21/2015 revealed increase conspicuity of subcentimeter lytic lesions in the calvarium.  Bone marrow aspirate and biopsy on 11/04/2015 revealed an atypical monoclonal plasma cells estimated at 30-40% of marrow cells.   Marrow was variably cellular (approximately 45%) with background trilineage hematopoiesis. There was no significant increase in marrow reticulin fibers. Storage iron was present.    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 He develoed herpes zoster in 04/2008. He developed a pulmonary embolism in 05/2013. He was initially on Xarelto, but is now on Eliquis. He had an episode of pneumonia around this time requiring a brief admission. He developed severe lower leg cramps on 08/07/2014 secondary to hypokalemia. Duplex was negative.   He was  treated for C difficile colitis (Flagyl completed 07/30/2015).  He has a chronic indwelling pain pump.  He received 4 cycles of Pomalyst and Decadron (08/27/2015 - 11/19/2015).  Restaging studies document progressive disease.  Kappa free light chains are increasing.  SPEP revealed 0.5 gm/dL monoclonal protein then 1.3 gm/dL.  Bone survey reveals increase conspicuity of subcentimeter lytic lesions in the calvarium.  Bone marrow reveals 30-40% plasma cells.   MUGA on 11/30/2015 revealed an ejection fraction of 46%.  He is felt not to be a good candidate for Kyprolis.  There were no focal wall motion abnormalities.  He had a stress echo less than 1 year ago.  He has a history of PVCs and atrial fibrillation.  He takes Cardizem prn.  He received 17 weeks of daratumumab (Darzalex) (12/09/2015 - 05/25/2016).  He tolerated treatment well without side  effect.  Bone marrow on 02/09/2016 revealed no diagnostic morphologic evidence of plasma cell myeloma.  Marrow was normocellular to hypocellular marrow for age (ranging from 10-40%) with maturing trilineage hematopoiesis and mild multilineage dyspoiesis.  There was patchy mild increase in reticulin.  Storage iron was present.  Flow cytometry revealed no definitive evidence of monoclonality.  There was a non-specific atypical myeloid and monocytic findings with no increase in blasts.  Cytogenetics were normal (46, XY).  He is currently day 315 s/p 2nd autologous stem cell transplant on 06/16/2016.  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  He also experienced atrial fibrillation with intermittent episodes of RVR requiring IV beta blockers.  He is on prophylactic valacyclovir for 1 year post transplant.  He started vaccinations (DTaP-Pediatric triple vaccine, Hep B- Pediatrix triple vaccine, Haemophilus influenza B (Hib), inactivated polio virus (IPV), pneumococcal conjugate vaccine 13-valent (PCV 13)) on  04/17/2017.  Recommendation was for Shingrix vaccine to be given in [place] (not done) and second dose of Shingrix in 2 months.  He has follow-up vaccinations in 3 months (07/18/2017).    He is s/p 4 cycles of daratumumab (Darzalex) post transplant (12/05/2016 - 04/06/2017).  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  Work-up on 08/23/2016 revealed the following normal studies:  B12 and folate.  Ferritin was 1379 (high).  Iron studies included a saturation of 56% and TIBC 141 (low).  ESR was 64.  Thrombocytopenia has resolved.  He has a history of persistent hypomagnesemia and receives IV magnesium (2-4 gm) weekly.  He has not required magnesium since 10/24/2016.  Symptomatically, his diarrhea is controlled with Imodium 2/day.  He has a clear cough.  Hands are sensitive.  Exam reveals a RLL squeak.  M spike was 0 on 03/21/2017.  Plan:  1.  Labs today:  CBC with diff, CMP, Mg. 2.  CXR (PA and lateral) today. 3.  Sputum culture and sensitivity. 4.  Cycle #5 daratumumab today. 5. Patient to obtain Shingrix vaccine. 6.  RTC in 1 month for MD assessment, labs (CBC with diff, CMP, Mg, SPEP, free light chains), and cycle #6 daratumumab.  Addendum:  CXR today reveals low lung olumes and a mild increase in interstitial makings.  There was no focal alveolar infiltrate.   Lequita Asal, MD  05/01/2017, 10:46 AM

## 2017-05-01 NOTE — Progress Notes (Signed)
Patient states he has developed neuropathy in his hands over the past couple of months.  States it is not too bad, almost feels like he has had his hands in a chemical agent and did not get it washed off of his hands. States he has had some allergies lately also.

## 2017-05-03 ENCOUNTER — Other Ambulatory Visit: Payer: Self-pay | Admitting: Oncology

## 2017-05-03 DIAGNOSIS — C9001 Multiple myeloma in remission: Secondary | ICD-10-CM

## 2017-05-14 ENCOUNTER — Other Ambulatory Visit: Payer: Self-pay

## 2017-05-14 ENCOUNTER — Encounter: Payer: Self-pay | Admitting: Gastroenterology

## 2017-05-14 ENCOUNTER — Ambulatory Visit (INDEPENDENT_AMBULATORY_CARE_PROVIDER_SITE_OTHER): Payer: Medicare Other | Admitting: Gastroenterology

## 2017-05-14 VITALS — BP 111/65 | HR 96 | Temp 97.4°F | Ht 67.0 in | Wt 150.0 lb

## 2017-05-14 DIAGNOSIS — R197 Diarrhea, unspecified: Secondary | ICD-10-CM | POA: Diagnosis not present

## 2017-05-14 NOTE — Progress Notes (Signed)
Primary Care Physician: Sofie Hartigan, MD  Primary Gastroenterologist:  Dr. Lucilla Lame  Chief Complaint  Patient presents with  . Follow up lab results    HPI: Logan Perez is a 71 y.o. male here for follow-up of his diarrhea.  The patient has been doing well with Imodium as needed.  The patient denies any rectal bleeding.  The patient has had one accident since starting Imodium. The patient had a colonoscopy with biopsies of the terminal ileum showing some active ileitis with the colon biopsy showing some chronic inflammation.  Current Outpatient Prescriptions  Medication Sig Dispense Refill  . acetaminophen (TYLENOL) 500 MG tablet Take 1,000 mg by mouth every 8 (eight) hours as needed for mild pain or headache.     . ALPRAZolam (XANAX) 0.25 MG tablet TAKE 1 TABLET BY MOUTH EVERY NIGHT AT BEDTIME, AS NEEDED FOR ANXIETY 30 tablet 0  . baclofen (LIORESAL) 10 MG tablet Take 1 tablet (10 mg total) by mouth at bedtime. 30 each 3  . calcium citrate-vitamin D (CITRACAL+D) 315-200 MG-UNIT per tablet Take 1 tablet by mouth daily.     . cetirizine (ZYRTEC) 10 MG tablet Take 10 mg by mouth daily as needed for allergies.     . Cholecalciferol (VITAMIN D3 PO) Take by mouth daily.    . Cyanocobalamin (VITAMIN B-12 PO) Take by mouth daily.    . Daratumumab (DARZALEX IV) Inject into the vein.    [name] dexamethasone (DECADRON) 4 MG tablet Take 4 tabs PO 1 hr prior to infusion, then take 1 tab on day 2 and day 3 30 tablet 1  . diltiazem (CARDIZEM CD) 120 MG 24 hr capsule Take by mouth.    . diltiazem (CARDIZEM) 60 MG tablet Take 60 mg by mouth as needed (raised heart rate).     . diphenhydrAMINE (BENADRYL) 25 MG tablet Take by mouth.    Arne Cleveland 5 MG TABS tablet TAKE 1 TABLET(5 MG) BY MOUTH TWICE DAILY 60 tablet 0  . ENSURE (ENSURE) Take 237 mLs by mouth daily.    . fluticasone (FLONASE) 50 MCG/ACT nasal spray Place 1 spray into the nose daily as needed for allergies.     .  Iron-Vitamins (GERITOL PO) Take 1 tablet by mouth daily.     [name] lidocaine-prilocaine (EMLA) cream Apply 1 application topically as needed. 30 g 0  . loperamide (IMODIUM) 2 MG capsule Take 2-4 mg by mouth as needed for diarrhea or loose stools. Reported on 04/26/2016    . lovastatin (MEVACOR) 20 MG tablet Take 20 mg by mouth every evening.     . montelukast (SINGULAIR) 10 MG tablet TAKE 1 TABLET BY MOUTH ONCE A DAY AS DIRECTED. (Patient taking differently: Take 10mg s daily the day before the infusion, the day of, and the 2 days after infusion) 30 tablet 0  . naproxen (NAPROSYN) 250 MG tablet Take 250 mg by mouth.    . naproxen sodium (ANAPROX) 220 MG tablet Take 220-440 mg by mouth 2 (two) times daily as needed (pain).    [name] omeprazole (PRILOSEC) 20 MG capsule Take 20 mg by mouth daily.     . potassium chloride SA (K-DUR,KLOR-CON) 20 MEQ tablet TAKE 1 TABLET(20 MEQ) BY MOUTH DAILY 90 tablet 0  . prochlorperazine (COMPAZINE) 10 MG tablet Take 10 mg by mouth every 6 (six) hours as needed for nausea or vomiting.    . ranitidine (ZANTAC) 150 MG tablet Take 150 mg by mouth at bedtime.     [name]  tamsulosin (FLOMAX) 0.4 MG CAPS capsule Take 1 capsule (0.4 mg total) by mouth daily. 90 capsule 4  . valACYclovir (VALTREX) 500 MG tablet TAKE 1 TABLET BY MOUTH DAILY 30 tablet 0  . cephALEXin (KEFLEX) 500 MG capsule Take 1 capsule (500 mg total) by mouth 2 (two) times daily. (Patient not taking: Reported on 05/14/2017) 10 capsule 0  . PAIN MANAGEMENT IT PUMP REFILL 1 each by Intrathecal route once. Medication: PF Fentanyl 1,500.0 mcg/ml PF Bupivicaine 30.0 mg/ml PF Clonidine 300.18mcg/ml Total Volume: 40 ml Needed by 04-10-17 @ 1045 1 each 0   No current facility-administered medications for this visit.     Allergies as of 05/14/2017 - Review Complete 05/14/2017  Allergen Reaction Noted  . Azithromycin Diarrhea 11/10/2015  . Bee pollen  02/05/2017  . Pollen extract Other (See Comments) 02/05/2017  . Zometa  [zoledronic acid] Other (See Comments) 04/26/2015  . Rivaroxaban Rash 04/26/2015    ROS:  General: Negative for anorexia, weight loss, fever, chills, fatigue, weakness. ENT: Negative for hoarseness, difficulty swallowing , nasal congestion. CV: Negative for chest pain, angina, palpitations, dyspnea on exertion, peripheral edema.  Respiratory: Negative for dyspnea at rest, dyspnea on exertion, cough, sputum, wheezing.  GI: See history of present illness. GU:  Negative for dysuria, hematuria, urinary incontinence, urinary frequency, nocturnal urination.  Endo: Negative for unusual weight change.    Physical Examination:   BP 111/65   Pulse 96   Temp (!) 97.4 F (36.3 C) (Oral)   Ht 5\' 7"  (1.702 m)   Wt 150 lb (68 kg)   BMI 23.49 kg/m   General: Well-nourished, well-developed in no acute distress.  Eyes: No icterus. Conjunctivae pink. Extremities: No lower extremity edema. No clubbing or deformities. Neuro: Alert and oriented x 3.  Grossly intact. Skin: Warm and dry, no jaundice.   Psych: Alert and cooperative, normal mood and affect.  Labs:   Imaging Studies: Dg Chest 2 View  Result Date: 05/01/2017 CLINICAL DATA:  Productive cough. EXAM: CHEST  2 VIEW COMPARISON:  Chest x-ray 08/21/2014, 09/19/2013. FINDINGS: PowerPort catheter with tip projected over the cavoatrial junction. Heart size normal. Low lung volumes with basilar atelectasis. Slight increase in interstitial prominence. A mild process such as pneumonitis cannot be excluded. No pleural effusion or pneumothorax. Degenerative changes thoracic spine. IMPRESSION: 1. PowerPort catheter noted with tip projected over cavoatrial junction. 2. Low lung volumes. Mild increase in interstitial markings. A mild process such as pneumonitis cannot be excluded. No focal alveolar infiltrate. Electronically Signed   By: Marcello Moores  Register   On: 05/01/2017 12:37    Assessment and Plan:   Logan Perez is a 71 y.o. y/o male Who  has a history of diarrhea with a colonoscopy showing active ileitis with chronic colonic inflammation.  The differential diagnosis included anti-inflammatory medication which the patient does take versus inflammatory bowel disease.  The patient will have his blood sent off for an IBD panel.  If this is positive he may be tried on a trial of IBD medication.  The patient and his wife have been explained the plan and agree with it.    Lucilla Lame, MD. Marval Regal   Note: This dictation was prepared with Dragon dictation along with smaller phrase technology. Any transcriptional errors that result from this process are unintentional.

## 2017-05-18 ENCOUNTER — Other Ambulatory Visit: Payer: Self-pay | Admitting: Hematology and Oncology

## 2017-05-21 ENCOUNTER — Other Ambulatory Visit: Payer: Self-pay | Admitting: *Deleted

## 2017-05-21 DIAGNOSIS — C9001 Multiple myeloma in remission: Secondary | ICD-10-CM

## 2017-05-21 MED ORDER — ONDANSETRON 8 MG PO TBDP: ORAL_TABLET | ORAL | 0 refills | Status: DC

## 2017-05-21 MED ORDER — ALPRAZOLAM 0.25 MG PO TABS: ORAL_TABLET | ORAL | 1 refills | Status: DC

## 2017-05-21 NOTE — Telephone Encounter (Signed)
Patient requesting rx for Zofran 8 mg ODT to bring with him for his treatments, Insurance does not cover [place] giving it to him so he wants to bring his own. Please advise if you are ok with this

## 2017-05-26 ENCOUNTER — Other Ambulatory Visit: Payer: Self-pay | Admitting: Hematology and Oncology

## 2017-05-26 DIAGNOSIS — C9001 Multiple myeloma in remission: Secondary | ICD-10-CM

## 2017-05-31 ENCOUNTER — Other Ambulatory Visit: Payer: Self-pay | Admitting: *Deleted

## 2017-05-31 DIAGNOSIS — C9000 Multiple myeloma not having achieved remission: Secondary | ICD-10-CM

## 2017-06-01 ENCOUNTER — Inpatient Hospital Stay: Payer: Medicare Other | Attending: Hematology and Oncology | Admitting: Hematology and Oncology

## 2017-06-01 ENCOUNTER — Inpatient Hospital Stay: Payer: Medicare Other

## 2017-06-01 ENCOUNTER — Other Ambulatory Visit: Payer: Self-pay | Admitting: *Deleted

## 2017-06-01 ENCOUNTER — Encounter: Payer: Self-pay | Admitting: Hematology and Oncology

## 2017-06-01 VITALS — BP 124/81 | HR 81 | Temp 96.4°F | Resp 18 | Wt 157.6 lb

## 2017-06-01 DIAGNOSIS — C9002 Multiple myeloma in relapse: Secondary | ICD-10-CM

## 2017-06-01 DIAGNOSIS — Z9484 Stem cells transplant status: Secondary | ICD-10-CM | POA: Diagnosis not present

## 2017-06-01 DIAGNOSIS — N400 Enlarged prostate without lower urinary tract symptoms: Secondary | ICD-10-CM

## 2017-06-01 DIAGNOSIS — Z7189 Other specified counseling: Secondary | ICD-10-CM

## 2017-06-01 DIAGNOSIS — R197 Diarrhea, unspecified: Secondary | ICD-10-CM

## 2017-06-01 DIAGNOSIS — Z7901 Long term (current) use of anticoagulants: Secondary | ICD-10-CM | POA: Diagnosis not present

## 2017-06-01 DIAGNOSIS — I4891 Unspecified atrial fibrillation: Secondary | ICD-10-CM | POA: Insufficient documentation

## 2017-06-01 DIAGNOSIS — K529 Noninfective gastroenteritis and colitis, unspecified: Secondary | ICD-10-CM | POA: Diagnosis not present

## 2017-06-01 DIAGNOSIS — E785 Hyperlipidemia, unspecified: Secondary | ICD-10-CM

## 2017-06-01 DIAGNOSIS — Z79899 Other long term (current) drug therapy: Secondary | ICD-10-CM | POA: Insufficient documentation

## 2017-06-01 DIAGNOSIS — M549 Dorsalgia, unspecified: Secondary | ICD-10-CM | POA: Diagnosis not present

## 2017-06-01 DIAGNOSIS — D638 Anemia in other chronic diseases classified elsewhere: Secondary | ICD-10-CM | POA: Insufficient documentation

## 2017-06-01 DIAGNOSIS — R05 Cough: Secondary | ICD-10-CM | POA: Insufficient documentation

## 2017-06-01 DIAGNOSIS — Z8673 Personal history of transient ischemic attack (TIA), and cerebral infarction without residual deficits: Secondary | ICD-10-CM | POA: Insufficient documentation

## 2017-06-01 DIAGNOSIS — Z978 Presence of other specified devices: Secondary | ICD-10-CM | POA: Diagnosis not present

## 2017-06-01 DIAGNOSIS — F419 Anxiety disorder, unspecified: Secondary | ICD-10-CM

## 2017-06-01 DIAGNOSIS — I10 Essential (primary) hypertension: Secondary | ICD-10-CM | POA: Diagnosis not present

## 2017-06-01 DIAGNOSIS — G8929 Other chronic pain: Secondary | ICD-10-CM | POA: Insufficient documentation

## 2017-06-01 DIAGNOSIS — Z87891 Personal history of nicotine dependence: Secondary | ICD-10-CM | POA: Insufficient documentation

## 2017-06-01 DIAGNOSIS — Z8739 Personal history of other diseases of the musculoskeletal system and connective tissue: Secondary | ICD-10-CM | POA: Insufficient documentation

## 2017-06-01 DIAGNOSIS — Z86711 Personal history of pulmonary embolism: Secondary | ICD-10-CM | POA: Insufficient documentation

## 2017-06-01 DIAGNOSIS — K219 Gastro-esophageal reflux disease without esophagitis: Secondary | ICD-10-CM | POA: Diagnosis not present

## 2017-06-01 DIAGNOSIS — Z5112 Encounter for antineoplastic immunotherapy: Secondary | ICD-10-CM | POA: Diagnosis not present

## 2017-06-01 DIAGNOSIS — C9000 Multiple myeloma not having achieved remission: Secondary | ICD-10-CM

## 2017-06-01 DIAGNOSIS — E440 Moderate protein-calorie malnutrition: Secondary | ICD-10-CM

## 2017-06-01 DIAGNOSIS — Z9225 Personal history of immunosupression therapy: Secondary | ICD-10-CM | POA: Diagnosis not present

## 2017-06-01 DIAGNOSIS — Q828 Other specified congenital malformations of skin: Secondary | ICD-10-CM

## 2017-06-01 DIAGNOSIS — Z800 Family history of malignant neoplasm of digestive organs: Secondary | ICD-10-CM

## 2017-06-01 LAB — CBC WITH DIFFERENTIAL/PLATELET
Basophils Absolute: 0.1 10*3/uL (ref 0–0.1)
Basophils Relative: 1 %
Eosinophils Absolute: 0.2 10*3/uL (ref 0–0.7)
Eosinophils Relative: 3 %
HCT: 30.6 % — ABNORMAL LOW (ref 40.0–52.0)
Hemoglobin: 10.9 g/dL — ABNORMAL LOW (ref 13.0–18.0)
Lymphocytes Relative: 27 %
Lymphs Abs: 2 10*3/uL (ref 1.0–3.6)
MCH: 35.9 pg — ABNORMAL HIGH (ref 26.0–34.0)
MCHC: 35.6 g/dL (ref 32.0–36.0)
MCV: 100.8 fL — ABNORMAL HIGH (ref 80.0–100.0)
Monocytes Absolute: 0.8 10*3/uL (ref 0.2–1.0)
Monocytes Relative: 11 %
Neutro Abs: 4.4 10*3/uL (ref 1.4–6.5)
Neutrophils Relative %: 58 %
Platelets: 171 10*3/uL (ref 150–440)
RBC: 3.03 MIL/uL — ABNORMAL LOW (ref 4.40–5.90)
RDW: 16.3 % — ABNORMAL HIGH (ref 11.5–14.5)
WBC: 7.5 10*3/uL (ref 3.8–10.6)

## 2017-06-01 LAB — COMPREHENSIVE METABOLIC PANEL
ALT: 13 U/L — ABNORMAL LOW (ref 17–63)
AST: 18 U/L (ref 15–41)
Albumin: 3.8 g/dL (ref 3.5–5.0)
Alkaline Phosphatase: 49 U/L (ref 38–126)
Anion gap: 5 (ref 5–15)
BUN: 18 mg/dL (ref 6–20)
CO2: 26 mmol/L (ref 22–32)
Calcium: 8.8 mg/dL — ABNORMAL LOW (ref 8.9–10.3)
Chloride: 108 mmol/L (ref 101–111)
Creatinine, Ser: 1.3 mg/dL — ABNORMAL HIGH (ref 0.61–1.24)
GFR calc Af Amer: >60 mL/min (ref >60)
GFR calc non Af Amer: 54 mL/min — ABNORMAL LOW (ref >60)
Glucose, Bld: 114 mg/dL — ABNORMAL HIGH (ref 65–99)
Potassium: 3.9 mmol/L (ref 3.5–5.1)
Sodium: 139 mmol/L (ref 135–145)
Total Bilirubin: 0.6 mg/dL (ref 0.3–1.2)
Total Protein: 5.9 g/dL — ABNORMAL LOW (ref 6.5–8.1)

## 2017-06-01 LAB — MAGNESIUM: Magnesium: 1.9 mg/dL (ref 1.7–2.4)

## 2017-06-01 MED ORDER — HEPARIN SOD (PORK) LOCK FLUSH 100 UNIT/ML IV SOLN
500.0000 [IU] | Freq: Once | INTRAVENOUS | Status: AC
  Administered 2017-06-01: 500 [IU] via INTRAVENOUS

## 2017-06-01 MED ORDER — HEPARIN SOD (PORK) LOCK FLUSH 100 UNIT/ML IV SOLN
500.0000 [IU] | Freq: Once | INTRAVENOUS | Status: AC | PRN
  Administered 2017-06-01: 500 [IU]

## 2017-06-01 MED ORDER — ONDANSETRON 8 MG PO TBDP: 8.0000 mg | ORAL_TABLET | Freq: Once | ORAL | Status: DC

## 2017-06-01 MED ORDER — ACETAMINOPHEN 325 MG PO TABS: 650.0000 mg | ORAL_TABLET | Freq: Once | ORAL | Status: DC

## 2017-06-01 MED ORDER — SODIUM CHLORIDE 0.9% FLUSH
10.0000 mL | Freq: Once | INTRAVENOUS | Status: AC
  Administered 2017-06-01: 10 mL via INTRAVENOUS
  Filled 2017-06-01: qty 10

## 2017-06-01 MED ORDER — SODIUM CHLORIDE 0.9 % IV SOLN
Freq: Once | INTRAVENOUS | Status: AC
Start: 2017-06-01 — End: 2017-06-01
  Administered 2017-06-01: 10:00:00 via INTRAVENOUS
  Filled 2017-06-01: qty 1000

## 2017-06-01 MED ORDER — DIPHENHYDRAMINE HCL 25 MG PO CAPS: 50.0000 mg | ORAL_CAPSULE | Freq: Once | ORAL | Status: DC

## 2017-06-01 MED ORDER — SODIUM CHLORIDE 0.9 % IV SOLN
1200.0000 mg | Freq: Once | INTRAVENOUS | Status: AC
  Administered 2017-06-01: 1200 mg via INTRAVENOUS
  Filled 2017-06-01: qty 60

## 2017-06-01 NOTE — Progress Notes (Signed)
[facility] day:  06/01/17  Chief Complaint: Logan Perez is a 71 y.o. 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  HPI: The patient was last seen in the medical oncology clinic on 05/01/2017.  At that time, he had a clear cough.  Exam revealed a RLL squeak.  CXR was negative.  He noted sensitivity in his hands.  He was taking 2 Imodium/day for chronic diarrhea.  He received cycle #5 daratumumab.  He was to obtain a Shingrix vaccine. To date, he has not had the injection. Scheduled to receive next week.   The patient had a prolonged cough that last about 4 days after being seen here in the clinic. He states, "It got so bad that I about went to the ED".  It resolved without treatment.   During the interim, he has been feeling "pretty good".  He is gaining weight; 7lb gain since last visit. Patient's 1 year stem cell transplant anniversary date is coming up on 06/15/2017.  He continues to report sensitivity to his hands.  He continues to use Imodium (2 tabs daily) for chronic diarrhea. Patient has been seen by Dr. Allen Norris in consult. Patient with request from Dr. Allen Norris; wants lab draw today (written order from Dr. Allen Norris for "IBD expanded panel"; Dx: R19.7).   Past Medical History:  Diagnosis Date  . Anxiety   . Atrial fibrillation ([place])   . BPH (benign prostatic hyperplasia)   . Complication of anesthesia    BAD HEADACHE NIGHT OF FIRST CATARACT  . Compression fracture of lumbar vertebra ([place])   . Difficulty voiding   . Dysrhythmia    A FIB  . Elevated PSA   . GERD (gastroesophageal reflux disease)   . Hearing aid worn    bilateral  . HLD (hyperlipidemia)   . HOH (hard of hearing)   . Hypertension   . Multiple myeloma ([place])   . Neuropathy    feet. 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  . Pain     BACK  . Palpitations   . Pneumonia   . Pulmonary embolism ([place])   . Sepsis ([place])   . Stroke [facility])    TIA, detected on CT scan. pt was unaware    Past Surgical History:  Procedure Laterality Date  . BACK SURGERY  1994  . CATARACT EXTRACTION W/PHACO Right 05/02/2016   Procedure: CATARACT EXTRACTION PHACO AND INTRAOCULAR LENS PLACEMENT (IOC);  Surgeon: Birder Robson, MD;  Location: [facility] ORS;  Service: Ophthalmology;  Laterality: Right;  [place] 1.06 AP% 20.6 CDE 13.70 FLUID PACK LOT # [id number]  . CATARACT EXTRACTION W/PHACO Left 05/16/2016   Procedure: CATARACT EXTRACTION PHACO AND INTRAOCULAR LENS PLACEMENT (IOC);  Surgeon: Birder Robson, MD;  Location: [facility] ORS;  Service: Ophthalmology;  Laterality: Left;  [place] 01:43 AP% 19.8 CDE 20.45 FLUID PACK LOT #[id number] H  . COLONOSCOPY WITH PROPOFOL N/A 03/22/2017   Procedure: COLONOSCOPY WITH PROPOFOL;  Surgeon: Lucilla Lame, MD;  Location: [facility];  Service: Endoscopy;  Laterality: N/A;  has port  . ESOPHAGOGASTRODUODENOSCOPY (EGD) WITH PROPOFOL  03/22/2017   Procedure: ESOPHAGOGASTRODUODENOSCOPY (EGD) WITH PROPOFOL;  Surgeon: Lucilla Lame, MD;  Location: [place];  Service: Endoscopy;;  . KNEE ARTHROSCOPY Left 1992  . LIMBAL STEM CELL TRANSPLANT    . PAIN PUMP IMPLANTATION  2012  . PORTA CATH  INSERTION N/A 12/04/2016   Procedure: Glori Luis Cath Insertion;  Surgeon: Algernon Huxley, MD;  Location: [facility] [place] [facility];  Service: Cardiovascular;  Laterality: N/A;  . stem cell implant  2008   [facility]    Family History  Problem Relation Age of Onset  . Cancer Father        throat  . Kidney disease Sister   . Stroke Unknown   . Bladder Cancer Neg Hx   . Prostate cancer Neg Hx   . Kidney cancer Neg Hx     Social History:  reports that he quit smoking about 41 years ago. His smoking use included Cigarettes. He has a 30.00 pack-year smoking history. He has never used smokeless tobacco. He reports that he does not drink alcohol or use  drugs.  He has a wire haired dachshund.  He lives in [place].  His wife's name is [name].  The patient is alone today.  Allergies:  Allergies  Allergen Reactions  . Azithromycin Diarrhea    Possible cause of C. Diff  . Bee Pollen     Other reaction(s): Other (See Comments) Sneezing, watery eyes, runny nose  . Pollen Extract Other (See Comments)    Sneezing, watery eyes, runny nose  . Zometa [Zoledronic Acid] Other (See Comments)    ONG- Osteonecrosis of the jaw   . Rivaroxaban Rash    Current Medications: Current Outpatient Prescriptions  Medication Sig Dispense Refill  . acetaminophen (TYLENOL) 500 MG tablet Take 1,000 mg by mouth every 8 (eight) hours as needed for mild pain or headache.     . ALPRAZolam (XANAX) 0.25 MG tablet TAKE 1 TABLET BY MOUTH EVERY NIGHT AT BEDTIME, AS NEEDED FOR ANXIETY 30 tablet 1  . baclofen (LIORESAL) 10 MG tablet Take 1 tablet (10 mg total) by mouth at bedtime. 30 each 3  . calcium citrate-vitamin D (CITRACAL+D) 315-200 MG-UNIT per tablet Take 1 tablet by mouth daily.     . cetirizine (ZYRTEC) 10 MG tablet Take 10 mg by mouth daily as needed for allergies.     . Cholecalciferol (VITAMIN D3 PO) Take by mouth daily.    . Cyanocobalamin (VITAMIN B-12 PO) Take by mouth daily.    . Daratumumab (DARZALEX IV) Inject into the vein.    [name] dexamethasone (DECADRON) 4 MG tablet Take 4 tabs PO 1 hr prior to infusion, then take 1 tab on day 2 and day 3 30 tablet 1  . diltiazem (CARDIZEM CD) 120 MG 24 hr capsule Take by mouth.    . diltiazem (CARDIZEM) 60 MG tablet Take 60 mg by mouth as needed (raised heart rate).     . diphenhydrAMINE (BENADRYL) 25 MG tablet Take by mouth.    Arne Cleveland 5 MG TABS tablet TAKE 1 TABLET(5 MG) BY MOUTH TWICE DAILY 60 tablet 0  . ENSURE (ENSURE) Take 237 mLs by mouth daily.    . fluticasone (FLONASE) 50 MCG/ACT nasal spray Place 1 spray into the nose daily as needed for allergies.     . Iron-Vitamins (GERITOL PO) Take 1 tablet by mouth  daily.     [name] lidocaine-prilocaine (EMLA) cream Apply 1 application topically as needed. 30 g 0  . loperamide (IMODIUM) 2 MG capsule Take 2-4 mg by mouth as needed for diarrhea or loose stools. Reported on 04/26/2016    . lovastatin (MEVACOR) 20 MG tablet Take 20 mg by mouth every evening.     . montelukast (SINGULAIR) 10 MG tablet TAKE 1 TABLET BY MOUTH  ONCE A DAY AS DIRECTED. (Patient taking differently: Take 36ms daily the day before the infusion, the day of, and the 2 days after infusion) 30 tablet 0  . naproxen (NAPROSYN) 250 MG tablet Take 250 mg by mouth.    . naproxen sodium (ANAPROX) 220 MG tablet Take 220-440 mg by mouth 2 (two) times daily as needed (pain).    .Marland Kitchenomeprazole (PRILOSEC) 20 MG capsule Take 20 mg by mouth daily.     . ondansetron (ZOFRAN-ODT) 8 MG disintegrating tablet Take 1 tablet prior to chemo treatment 10 tablet 0  . PAIN MANAGEMENT IT PUMP REFILL 1 each by Intrathecal route once. Medication: PF Fentanyl 1,500.0 mcg/ml PF Bupivicaine 30.0 mg/ml PF Clonidine 300.042m/ml Total Volume: 40 ml Needed by 04-10-17 @ 1045 1 each 0  . potassium chloride SA (K-DUR,KLOR-CON) 20 MEQ tablet TAKE 1 TABLET(20 MEQ) BY MOUTH DAILY 90 tablet 0  . prochlorperazine (COMPAZINE) 10 MG tablet Take 10 mg by mouth every 6 (six) hours as needed for nausea or vomiting.    . ranitidine (ZANTAC) 150 MG tablet Take 150 mg by mouth at bedtime.     . tamsulosin (FLOMAX) 0.4 MG CAPS capsule Take 1 capsule (0.4 mg total) by mouth daily. 90 capsule 4  . valACYclovir (VALTREX) 500 MG tablet TAKE 1 TABLET BY MOUTH DAILY 30 tablet 0  . cephALEXin (KEFLEX) 500 MG capsule Take 1 capsule (500 mg total) by mouth 2 (two) times daily. (Patient not taking: Reported on 05/14/2017) 10 capsule 0   No current facility-administered medications for this visit.    Facility-Administered Medications Ordered in Other Visits  Medication Dose Route Frequency Provider Last Rate Last Dose  . heparin lock flush 100 unit/mL   500 Units Intravenous Once CoLequita AsalMD        Review of Systems:  GENERAL: Feels "pretty good".  Energy level normal.  No fevers or sweats.  Weight up 7 pounds.  PERFORMANCE STATUS (ECOG): 1 HEENT:  Cataracts s/p surgery.  Altered taste sensation.  No sore throat, mouth sores or tenderness. Lungs:  No shortness of breath.  No cough.  No hemoptysis. Cardiac:  No chest pain, palpitations, orthopnea, or PND. GI:  Appetite good. Diarrhea, managed (2 Imodium/day).  No vomiting, constipation, melena or hematochezia. GU:  No urgency, frequency, dysuria, or hematuria. Musculoskeletal:  Compression fracture of back on a pain pump. No muscle tenderness. Extremities:  No pain or swelling. Skin:  Fragile skin.  No rashes or skin changes. Neuro:  Little tremor (chronic).  Neuropathy in feet.  Sensitivity in hands (see HPI).  No headache, weakness, balance or coordination issues. Endocrine:  No diabetes, thyroid issues, hot flashes or night sweats. Psych:  No mood changes, depression or anxiety. Pain:  Chronic back pain (pain well managed with pump). Review of systems:  All other systems reviewed and found to be negative.   Physical Exam: Blood pressure 124/81, pulse 81, temperature (!) 96.4 F (35.8 C), temperature source Tympanic, resp. rate 18, weight 157 lb 9 oz (71.5 kg). GENERAL:  Thin elderly gentleman sitting comfortably in the exam room in no acute distress.  MENTAL STATUS:  Alert and oriented to person, place and time. HEAD:  Short brown hair.  Graying goatee.  Normocephalic, atraumatic, face symmetric, no Cushingoid features. EYES:  Glasses.  Blue eyes s/p cataract surgery.  Pupils equal round and reactive to light and accomodation.  No conjunctivitis or scleral icterus. ENT:  Hearing aide.  Oropharynx clear without lesion.  Hearing  aide.  Tongue normal. Mucous membranes moist.  RESPIRATORY:  Clear to auscultation without rales, wheezes or rhonchi. CARDIOVASCULAR:  Regular rate  and rhythm without murmur, rub or gallop. ABDOMEN:  RUQ pain pump.  Soft, non-tender, with active bowel sounds, and no hepatosplenomegaly.  No masses. SKIN:  No rashes, ulcers or lesions. EXTREMITIES: No edema, skin discoloration or tenderness.  No palpable cords. LYMPH NODES: No palpable cervical, supraclavicular, axillary or inguinal adenopathy  NEUROLOGICAL: Intention tremor. PSYCH:  Appropriate.    Infusion on 06/01/2017  Component Date Value Ref Range Status  . WBC 06/01/2017 7.5  3.8 - 10.6 K/uL Final  . RBC 06/01/2017 3.03* 4.40 - 5.90 MIL/uL Final  . Hemoglobin 06/01/2017 10.9* 13.0 - 18.0 g/dL Final  . HCT 06/01/2017 30.6* 40.0 - 52.0 % Final  . MCV 06/01/2017 100.8* 80.0 - 100.0 fL Final  . MCH 06/01/2017 35.9* 26.0 - 34.0 pg Final  . MCHC 06/01/2017 35.6  32.0 - 36.0 g/dL Final  . RDW 06/01/2017 16.3* 11.5 - 14.5 % Final  . Platelets 06/01/2017 171  150 - 440 K/uL Final  . Neutrophils Relative % 06/01/2017 58  % Final  . Neutro Abs 06/01/2017 4.4  1.4 - 6.5 K/uL Final  . Lymphocytes Relative 06/01/2017 27  % Final  . Lymphs Abs 06/01/2017 2.0  1.0 - 3.6 K/uL Final  . Monocytes Relative 06/01/2017 11  % Final  . Monocytes Absolute 06/01/2017 0.8  0.2 - 1.0 K/uL Final  . Eosinophils Relative 06/01/2017 3  % Final  . Eosinophils Absolute 06/01/2017 0.2  0 - 0.7 K/uL Final  . Basophils Relative 06/01/2017 1  % Final  . Basophils Absolute 06/01/2017 0.1  0 - 0.1 K/uL Final  . Sodium 06/01/2017 139  135 - 145 mmol/L Final  . Potassium 06/01/2017 3.9  3.5 - 5.1 mmol/L Final  . Chloride 06/01/2017 108  101 - 111 mmol/L Final  . CO2 06/01/2017 26  22 - 32 mmol/L Final  . Glucose, Bld 06/01/2017 114* 65 - 99 mg/dL Final  . BUN 06/01/2017 18  6 - 20 mg/dL Final  . Creatinine, Ser 06/01/2017 1.30* 0.61 - 1.24 mg/dL Final  . Calcium 06/01/2017 8.8* 8.9 - 10.3 mg/dL Final  . Total Protein 06/01/2017 5.9* 6.5 - 8.1 g/dL Final  . Albumin 06/01/2017 3.8  3.5 - 5.0 g/dL Final  . AST  06/01/2017 18  15 - 41 U/L Final  . ALT 06/01/2017 13* 17 - 63 U/L Final  . Alkaline Phosphatase 06/01/2017 49  38 - 126 U/L Final  . Total Bilirubin 06/01/2017 0.6  0.3 - 1.2 mg/dL Final  . GFR calc non Af Amer 06/01/2017 54* >60 mL/min Final  . GFR calc Af Amer 06/01/2017 >60  >60 mL/min Final   Comment: (NOTE) The eGFR has been calculated using the CKD EPI equation. This calculation has not been validated in all clinical situations. eGFR's persistently <60 mL/min signify possible Chronic Kidney Disease.   . Anion gap 06/01/2017 5  5 - 15 Final  . Magnesium 06/01/2017 1.9  1.7 - 2.4 mg/dL Final    Assessment:  Logan Perez is a 71 y.o. male with stage III mutiple myeloma.  He initially presented with progressive back pain beginning in 12/2006.  MRI revealed "spots and compression fractures".  He began Velcade, thalidomide, and Decadron.  In 08/2007, he underwent high dose chemotherapy and autologous stem cell transplant.  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  He recurred with  a rising M-spike (2.7) with repeat M spike (1.7 gm/dl) in 03/2010.  He was initially treated with Velcade (02/08/2010 - 05/10/2010).  He then began Revlimid (15 mg 3 weeks on/1 week off) and Decadron (40 mg on day 1, 8, 15, 22).  Because of significant side effect with Decadron his dose was decreased to 10 mg once a week in 07/2010.    He was on maintenance Revlimid. Revlimid was initially10 mg 3 weeks on/1 week off. This was changed to 10 mg 2 weeks on/2 weeks off secondary to right nipple tenderness. His dose was increased to 10 mg 3 weeks on/1 week off with Decadron 10 mg a week (on Sundays) and then Revlamid 15 mg 3 weeks on and 1 week off with Decadron on Sundays.  He began Pomalyst 4 mg 3 weeks on/1 week off with Decadron on 08/27/2015.  Over the past year his SPEP has revealed no monoclonal protein (04/21/2015) and 0.5 gm/dL on 09/22/2015 and 10/20/2015. M spike was 0.1 on  02/02/2016, 03/01/2016, 03/29/2016, 04/26/2016, and 05/24/2016.  M spike was 0 on 10/11/2016, 11/28/2016, 02/05/2017, and 03/21/2017.  Free light chains have been monitored. Kappa free light chains were 18.54 on 11/21/2013, 18.37 on 02/20/2014, 18.93 on 05/22/2014, 32.58 (high; normal ratio 1.27) on 08/21/2014, 51.53 (high; elevated ratio of 2.12) on 11/13/2014, 28.08 (ratio 1.73) on 12/09/2014, 23.71 (ratio 2.17) on 01/18/2015, 92.93 (ratio 9.49) on 04/21/2015, 93.44 (ratio 10.28) on 05/26/2015, 255.45 (ratio of 24.05) on 07/14/2015, 373.89 (ratio 48.31) on 08/04/2015, 474.33 (ratio 70.58) on 08/25/2015, 450.76 (ratio 55.44) on 09/22/2015, 453.4 (ratio 59.89) on 10/20/2015, 58.26 (ratio of > 40.74) on 02/02/2016, 62.67 (ratio of 37.98) on 03/01/2016, 75.7 (ratio > 50.47) on 03/29/2016, 79.7 (ratio 53.13) on 04/26/2016, 108.6 (ratio > 72.4) on 05/24/2016, 8.3 (1.46 ratio) on 10/11/2016, 6.7 (0.81 ratio) on 02/05/2017, and 7.8 (1.01 ratio) on 03/21/2017.  Bone survey on 12/08/2014 was stable.  Bone survey on 10/21/2015 revealed increase conspicuity of subcentimeter lytic lesions in the calvarium.  Bone marrow aspirate and biopsy on 11/04/2015 revealed an atypical monoclonal plasma cells estimated at 30-40% of marrow cells.   Marrow was variably cellular (approximately 45%) with background trilineage hematopoiesis. There was no significant increase in marrow reticulin fibers. Storage iron was present.    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 He develoed herpes zoster in 04/2008. He developed a pulmonary embolism in 05/2013. He was initially on Xarelto, but is now on Eliquis. He had an episode of pneumonia around this time requiring a brief admission. He developed severe lower leg cramps on 08/07/2014 secondary to hypokalemia. Duplex was negative.   He was treated for C difficile colitis (Flagyl completed 07/30/2015).  He has a chronic  indwelling pain pump.  He received 4 cycles of Pomalyst and Decadron (08/27/2015 - 11/19/2015).  Restaging studies document progressive disease.  Kappa free light chains are increasing.  SPEP revealed 0.5 gm/dL monoclonal protein then 1.3 gm/dL.  Bone survey reveals increase conspicuity of subcentimeter lytic lesions in the calvarium.  Bone marrow reveals 30-40% plasma cells.   MUGA on 11/30/2015 revealed an ejection fraction of 46%.  He is felt not to be a good candidate for Kyprolis.  There were no focal wall motion abnormalities.  He had a stress echo less than 1 year ago.  He has a history of PVCs and atrial fibrillation.  He takes Cardizem prn.  He received 17 weeks of daratumumab (Darzalex) (12/09/2015 - 05/25/2016).  He  tolerated treatment well without side effect.  Bone marrow on 02/09/2016 revealed no diagnostic morphologic evidence of plasma cell myeloma.  Marrow was normocellular to hypocellular marrow for age (ranging from 10-40%) with maturing trilineage hematopoiesis and mild multilineage dyspoiesis.  There was patchy mild increase in reticulin.  Storage iron was present.  Flow cytometry revealed no definitive evidence of monoclonality.  There was a non-specific atypical myeloid and monocytic findings with no increase in blasts.  Cytogenetics were normal (46, XY).  He is currently day 351 s/p 2nd autologous stem cell transplant on 06/16/2016.  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  He also experienced atrial fibrillation with intermittent episodes of RVR requiring IV beta blockers.  He is on prophylactic valacyclovir for 1 year post transplant.  He started vaccinations (DTaP-Pediatric triple vaccine, Hep B- Pediatrix triple vaccine, Haemophilus influenza B (Hib), inactivated polio virus (IPV), pneumococcal conjugate vaccine 13-valent (PCV 13)) on 04/17/2017.  Recommendation was for Shingrix vaccine to be given in [place] (not done)  and second dose of Shingrix in 2 months.  He has follow-up vaccinations in 3 months (07/18/2017).    He is s/p 5 cycles of daratumumab (Darzalex) post transplant (12/05/2016 - 05/01/2017).  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  Work-up on 08/23/2016 revealed the following normal studies:  B12 and folate.  Ferritin was 1379 (high).  Iron studies included a saturation of 56% and TIBC 141 (low).  ESR was 64.  Thrombocytopenia has resolved.  He has a history of persistent hypomagnesemia and receives IV magnesium (2-4 gm) weekly.  He has not required magnesium since 10/24/2016.  Symptomatically, he feels "pretty good".  He is gaining weight. He continues to have diarrhea that is controlled with Imodium 2/day. Exam is unremarkable. WBC 7.5 with an [place] of 440. Hemoglobin 10.9, hematocrit 30.6, platelets 171,000.  Plan:  1.  Labs today:  CBC with diff, CMP, Mg, SPEP, IBD expanded panel (add on).  2.  Cycle #6 daratumumab today. 3. Patient to obtain Shingrix vaccine; scheduled for next week.. 4.  RTC in 1 month for MD assessment, labs (CBC with diff, CMP, Mg), and cycle #7 daratumumab.   Melissa C. Mike Gip, MD  06/01/2017, 9:02 AM

## 2017-06-01 NOTE — Progress Notes (Signed)
Patient states he has been to the dermatologist since his last visit.  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  Otherwise, offers no complaints.

## 2017-06-04 ENCOUNTER — Other Ambulatory Visit: Payer: Self-pay | Admitting: Hematology and Oncology

## 2017-06-04 DIAGNOSIS — C9001 Multiple myeloma in remission: Secondary | ICD-10-CM

## 2017-06-04 LAB — INFLAMMATORY BOWEL DISEASE-IBD
Atypical P-ANCA titer: <1:20 {titer}
Saccharomyces cerevisiae, IgA: <20 Units (ref 0.0–24.9)
Saccharomyces cerevisiae, IgG: 26.4 Units — ABNORMAL HIGH (ref 0.0–24.9)

## 2017-06-04 LAB — KAPPA/LAMBDA LIGHT CHAINS
Kappa free light chain: 7.6 mg/L (ref 3.3–19.4)
Kappa, lambda light chain ratio: 0.63 (ref 0.26–1.65)
Lambda free light chains: 12.1 mg/L (ref 5.7–26.3)

## 2017-06-05 LAB — PROTEIN ELECTROPHORESIS, SERUM
A/G Ratio: 2.2 — ABNORMAL HIGH (ref 0.7–1.7)
Albumin ELP: 3.7 g/dL (ref 2.9–4.4)
Alpha-1-Globulin: 0.1 g/dL (ref 0.0–0.4)
Alpha-2-Globulin: 0.7 g/dL (ref 0.4–1.0)
Beta Globulin: 0.7 g/dL (ref 0.7–1.3)
Gamma Globulin: 0.2 g/dL — ABNORMAL LOW (ref 0.4–1.8)
Globulin, Total: 1.7 g/dL — ABNORMAL LOW (ref 2.2–3.9)
Total Protein ELP: 5.4 g/dL — ABNORMAL LOW (ref 6.0–8.5)

## 2017-06-19 ENCOUNTER — Other Ambulatory Visit: Payer: Self-pay | Admitting: Hematology and Oncology

## 2017-07-02 ENCOUNTER — Inpatient Hospital Stay (HOSPITAL_BASED_OUTPATIENT_CLINIC_OR_DEPARTMENT_OTHER): Payer: Medicare Other | Admitting: Hematology and Oncology

## 2017-07-02 ENCOUNTER — Encounter: Payer: Self-pay | Admitting: Hematology and Oncology

## 2017-07-02 ENCOUNTER — Other Ambulatory Visit: Payer: Self-pay | Admitting: Hematology and Oncology

## 2017-07-02 ENCOUNTER — Inpatient Hospital Stay: Payer: Medicare Other

## 2017-07-02 ENCOUNTER — Other Ambulatory Visit: Payer: Self-pay | Admitting: *Deleted

## 2017-07-02 ENCOUNTER — Inpatient Hospital Stay: Payer: Medicare Other | Attending: Hematology and Oncology

## 2017-07-02 VITALS — BP 148/84 | HR 76 | Resp 18

## 2017-07-02 VITALS — BP 127/79 | HR 96 | Temp 95.4°F | Resp 18 | Wt 161.0 lb

## 2017-07-02 DIAGNOSIS — Z8673 Personal history of transient ischemic attack (TIA), and cerebral infarction without residual deficits: Secondary | ICD-10-CM

## 2017-07-02 DIAGNOSIS — I4891 Unspecified atrial fibrillation: Secondary | ICD-10-CM | POA: Insufficient documentation

## 2017-07-02 DIAGNOSIS — F419 Anxiety disorder, unspecified: Secondary | ICD-10-CM | POA: Diagnosis not present

## 2017-07-02 DIAGNOSIS — Z800 Family history of malignant neoplasm of digestive organs: Secondary | ICD-10-CM

## 2017-07-02 DIAGNOSIS — K219 Gastro-esophageal reflux disease without esophagitis: Secondary | ICD-10-CM | POA: Insufficient documentation

## 2017-07-02 DIAGNOSIS — Z87891 Personal history of nicotine dependence: Secondary | ICD-10-CM | POA: Insufficient documentation

## 2017-07-02 DIAGNOSIS — N400 Enlarged prostate without lower urinary tract symptoms: Secondary | ICD-10-CM | POA: Diagnosis not present

## 2017-07-02 DIAGNOSIS — R197 Diarrhea, unspecified: Secondary | ICD-10-CM | POA: Insufficient documentation

## 2017-07-02 DIAGNOSIS — Z86711 Personal history of pulmonary embolism: Secondary | ICD-10-CM | POA: Diagnosis not present

## 2017-07-02 DIAGNOSIS — D638 Anemia in other chronic diseases classified elsewhere: Secondary | ICD-10-CM | POA: Diagnosis not present

## 2017-07-02 DIAGNOSIS — C4491 Basal cell carcinoma of skin, unspecified: Secondary | ICD-10-CM | POA: Diagnosis not present

## 2017-07-02 DIAGNOSIS — C9000 Multiple myeloma not having achieved remission: Secondary | ICD-10-CM

## 2017-07-02 DIAGNOSIS — E785 Hyperlipidemia, unspecified: Secondary | ICD-10-CM | POA: Diagnosis not present

## 2017-07-02 DIAGNOSIS — Z79899 Other long term (current) drug therapy: Secondary | ICD-10-CM

## 2017-07-02 DIAGNOSIS — M549 Dorsalgia, unspecified: Secondary | ICD-10-CM

## 2017-07-02 DIAGNOSIS — E638 Other specified nutritional deficiencies: Secondary | ICD-10-CM

## 2017-07-02 DIAGNOSIS — G8929 Other chronic pain: Secondary | ICD-10-CM | POA: Insufficient documentation

## 2017-07-02 DIAGNOSIS — I10 Essential (primary) hypertension: Secondary | ICD-10-CM

## 2017-07-02 DIAGNOSIS — Z5112 Encounter for antineoplastic immunotherapy: Secondary | ICD-10-CM | POA: Diagnosis present

## 2017-07-02 DIAGNOSIS — C9002 Multiple myeloma in relapse: Secondary | ICD-10-CM

## 2017-07-02 DIAGNOSIS — Z7189 Other specified counseling: Secondary | ICD-10-CM

## 2017-07-02 DIAGNOSIS — Z9484 Stem cells transplant status: Secondary | ICD-10-CM | POA: Diagnosis not present

## 2017-07-02 LAB — COMPREHENSIVE METABOLIC PANEL
ALT: 14 U/L — ABNORMAL LOW (ref 17–63)
AST: 22 U/L (ref 15–41)
Albumin: 3.9 g/dL (ref 3.5–5.0)
Alkaline Phosphatase: 40 U/L (ref 38–126)
Anion gap: 6 (ref 5–15)
BUN: 25 mg/dL — ABNORMAL HIGH (ref 6–20)
CO2: 28 mmol/L (ref 22–32)
Calcium: 9.4 mg/dL (ref 8.9–10.3)
Chloride: 104 mmol/L (ref 101–111)
Creatinine, Ser: 1.36 mg/dL — ABNORMAL HIGH (ref 0.61–1.24)
GFR calc Af Amer: 59 mL/min — ABNORMAL LOW (ref >60)
GFR calc non Af Amer: 51 mL/min — ABNORMAL LOW (ref >60)
Glucose, Bld: 99 mg/dL (ref 65–99)
Potassium: 4.4 mmol/L (ref 3.5–5.1)
Sodium: 138 mmol/L (ref 135–145)
Total Bilirubin: 0.6 mg/dL (ref 0.3–1.2)
Total Protein: 6 g/dL — ABNORMAL LOW (ref 6.5–8.1)

## 2017-07-02 LAB — CBC WITH DIFFERENTIAL/PLATELET
Basophils Absolute: 0 10*3/uL (ref 0–0.1)
Basophils Relative: 1 %
Eosinophils Absolute: 0.1 10*3/uL (ref 0–0.7)
Eosinophils Relative: 2 %
HCT: 32.7 % — ABNORMAL LOW (ref 40.0–52.0)
Hemoglobin: 11.7 g/dL — ABNORMAL LOW (ref 13.0–18.0)
Lymphocytes Relative: 33 %
Lymphs Abs: 2.2 10*3/uL (ref 1.0–3.6)
MCH: 35.7 pg — ABNORMAL HIGH (ref 26.0–34.0)
MCHC: 35.7 g/dL (ref 32.0–36.0)
MCV: 100.1 fL — ABNORMAL HIGH (ref 80.0–100.0)
Monocytes Absolute: 0.8 10*3/uL (ref 0.2–1.0)
Monocytes Relative: 12 %
Neutro Abs: 3.4 10*3/uL (ref 1.4–6.5)
Neutrophils Relative %: 52 %
Platelets: 170 10*3/uL (ref 150–440)
RBC: 3.27 MIL/uL — ABNORMAL LOW (ref 4.40–5.90)
RDW: 15.8 % — ABNORMAL HIGH (ref 11.5–14.5)
WBC: 6.6 10*3/uL (ref 3.8–10.6)

## 2017-07-02 LAB — MAGNESIUM: Magnesium: 2.1 mg/dL (ref 1.7–2.4)

## 2017-07-02 MED ORDER — SODIUM CHLORIDE 0.9 % IV SOLN
1200.0000 mg | Freq: Once | INTRAVENOUS | Status: DC
  Filled 2017-07-02: qty 60

## 2017-07-02 MED ORDER — HEPARIN SOD (PORK) LOCK FLUSH 100 UNIT/ML IV SOLN
500.0000 [IU] | Freq: Once | INTRAVENOUS | Status: AC
  Administered 2017-07-02: 500 [IU] via INTRAVENOUS
  Filled 2017-07-02: qty 5

## 2017-07-02 MED ORDER — SODIUM CHLORIDE 0.9% FLUSH
10.0000 mL | INTRAVENOUS | Status: DC | PRN
  Administered 2017-07-02: 10 mL via INTRAVENOUS
  Filled 2017-07-02: qty 10

## 2017-07-02 MED ORDER — SODIUM CHLORIDE 0.9 % IV SOLN
Freq: Once | INTRAVENOUS | Status: AC
  Administered 2017-07-02: 10:00:00 via INTRAVENOUS
  Filled 2017-07-02: qty 1000

## 2017-07-02 MED ORDER — SODIUM CHLORIDE 0.9 % IV SOLN
1200.0000 mg | Freq: Once | INTRAVENOUS | Status: AC
  Administered 2017-07-02: 1200 mg via INTRAVENOUS
  Filled 2017-07-02: qty 60

## 2017-07-02 NOTE — Progress Notes (Signed)
Here for follow up. Stated doing well  

## 2017-07-02 NOTE — Progress Notes (Signed)
[facility] day:  07/02/17  Chief Complaint: Logan Perez is a 71 y.o. 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 M-spike was 0.  Free light chain ratio was 0.63 (normal).  HPI: The patient was last seen in the medical oncology clinic on 06/01/2017.  At that time,  he felt "pretty good".  He was gaining weight. He continued to have diarrhea that is controlled with Imodium 2/day. Exam was unremarkable.  He received cycle #6 daratumumab.  During the interim, patient doing "alright". Patient has chronic back pain. Patient denies fevers, sweats, and weight loss. Patient denies interval infections. Patient continues to have issues with his bowels. He is still using Imodium. Patient is eating well; he has gained 4 pounds since his last visit. Additionally, patient diagnosed with "basal cell carcinoma". He is scheduled for a Mohs procedure in December.   Patient received his shingles vaccination on 06/12/2017.   Past Medical History:  Diagnosis Date  . Anxiety   . Atrial fibrillation ([place])   . BPH (benign prostatic hyperplasia)   . Complication of anesthesia    BAD HEADACHE NIGHT OF FIRST CATARACT  . Compression fracture of lumbar vertebra ([place])   . Difficulty voiding   . Dysrhythmia    A FIB  . Elevated PSA   . GERD (gastroesophageal reflux disease)   . Hearing aid worn    bilateral  . HLD (hyperlipidemia)   . HOH (hard of hearing)   . Hypertension   . Multiple myeloma ([place])   . Neuropathy    feet. 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  . Pain    BACK  . Palpitations   . Pneumonia   . Pulmonary embolism ([place])   . Sepsis ([place])   . Stroke [facility])    TIA, detected on CT scan. pt was unaware    Past Surgical History:  Procedure Laterality Date  . BACK SURGERY  1994  . CATARACT EXTRACTION  W/PHACO Right 05/02/2016   Procedure: CATARACT EXTRACTION PHACO AND INTRAOCULAR LENS PLACEMENT (IOC);  Surgeon: Birder Robson, MD;  Location: [facility] ORS;  Service: Ophthalmology;  Laterality: Right;  [place] 1.06 AP% 20.6 CDE 13.70 FLUID PACK LOT # [id number]  . CATARACT EXTRACTION W/PHACO Left 05/16/2016   Procedure: CATARACT EXTRACTION PHACO AND INTRAOCULAR LENS PLACEMENT (IOC);  Surgeon: Birder Robson, MD;  Location: [facility] ORS;  Service: Ophthalmology;  Laterality: Left;  [place] 01:43 AP% 19.8 CDE 20.45 FLUID PACK LOT #[id number] H  . COLONOSCOPY WITH PROPOFOL N/A 03/22/2017   Procedure: COLONOSCOPY WITH PROPOFOL;  Surgeon: Lucilla Lame, MD;  Location: [place];  Service: Endoscopy;  Laterality: N/A;  has port  . ESOPHAGOGASTRODUODENOSCOPY (EGD) WITH PROPOFOL  03/22/2017   Procedure: ESOPHAGOGASTRODUODENOSCOPY (EGD) WITH PROPOFOL;  Surgeon: Lucilla Lame, MD;  Location: [place];  Service: Endoscopy;;  . KNEE ARTHROSCOPY Left 1992  . LIMBAL STEM CELL TRANSPLANT    . PAIN PUMP IMPLANTATION  2012  . PORTA CATH INSERTION N/A 12/04/2016   Procedure: Glori Luis Cath Insertion;  Surgeon: Algernon Huxley, MD;  Location: [place] [facility];  Service: Cardiovascular;  Laterality: N/A;  . stem cell implant  2008   [facility]    Family History  Problem Relation Age of Onset  . Cancer Father        throat  . Kidney disease Sister   .  Stroke Unknown   . Bladder Cancer Neg Hx   . Prostate cancer Neg Hx   . Kidney cancer Neg Hx     Social History:  reports that he quit smoking about 41 years ago. His smoking use included Cigarettes. He has a 30.00 pack-year smoking history. He has never used smokeless tobacco. He reports that he does not drink alcohol or use drugs.  He has a wire haired dachshund.  He lives in [place].  His wife's name is [name].  The patient is alone today.  Allergies:  Allergies  Allergen Reactions  . Azithromycin Diarrhea    Possible cause of C. Diff  . Bee Pollen     Other  reaction(s): Other (See Comments) Sneezing, watery eyes, runny nose  . Pollen Extract Other (See Comments)    Sneezing, watery eyes, runny nose  . Zometa [Zoledronic Acid] Other (See Comments)    ONG- Osteonecrosis of the jaw   . Rivaroxaban Rash    Current Medications: Current Outpatient Prescriptions  Medication Sig Dispense Refill  . acetaminophen (TYLENOL) 500 MG tablet Take 1,000 mg by mouth every 8 (eight) hours as needed for mild pain or headache.     . ALPRAZolam (XANAX) 0.25 MG tablet TAKE 1 TABLET BY MOUTH EVERY NIGHT AT BEDTIME, AS NEEDED FOR ANXIETY 30 tablet 1  . baclofen (LIORESAL) 10 MG tablet Take 1 tablet (10 mg total) by mouth at bedtime. 30 each 3  . calcium citrate-vitamin D (CITRACAL+D) 315-200 MG-UNIT per tablet Take 1 tablet by mouth daily.     . cetirizine (ZYRTEC) 10 MG tablet Take 10 mg by mouth daily as needed for allergies.     . Cholecalciferol (VITAMIN D3 PO) Take by mouth daily.    . Cyanocobalamin (VITAMIN B-12 PO) Take by mouth daily.    [name] dexamethasone (DECADRON) 4 MG tablet Take 4 tabs PO 1 hr prior to infusion, then take 1 tab on day 2 and day 3 30 tablet 1  . diltiazem (CARDIZEM CD) 120 MG 24 hr capsule Take by mouth.    . diphenhydrAMINE (BENADRYL) 25 MG tablet Take by mouth.    Arne Cleveland 5 MG TABS tablet TAKE 1 TABLET(5 MG) BY MOUTH TWICE DAILY 60 tablet 0  . ENSURE (ENSURE) Take 237 mLs by mouth daily.    . Iron-Vitamins (GERITOL PO) Take 1 tablet by mouth daily.     [name] loperamide (IMODIUM) 2 MG capsule Take 2-4 mg by mouth as needed for diarrhea or loose stools. Reported on 04/26/2016    . lovastatin (MEVACOR) 20 MG tablet Take 20 mg by mouth every evening.     . montelukast (SINGULAIR) 10 MG tablet TAKE 1 TABLET BY MOUTH ONCE A DAY AS DIRECTED. (Patient taking differently: Take 22ms daily the day before the infusion, the day of, and the 2 days after infusion) 30 tablet 0  . naproxen sodium (ANAPROX) 220 MG tablet Take 220-440 mg by mouth 2 (two)  times daily as needed (pain).    .Marland Kitchenomeprazole (PRILOSEC) 20 MG capsule Take 20 mg by mouth daily.     . ondansetron (ZOFRAN-ODT) 8 MG disintegrating tablet Take 1 tablet prior to chemo treatment 10 tablet 0  . potassium chloride SA (K-DUR,KLOR-CON) 20 MEQ tablet TAKE 1 TABLET(20 MEQ) BY MOUTH DAILY 90 tablet 0  . ranitidine (ZANTAC) 150 MG tablet Take 150 mg by mouth at bedtime.     .Marland KitchenSHINGRIX injection     . tamsulosin (FLOMAX) 0.4 MG CAPS capsule  Take 1 capsule (0.4 mg total) by mouth daily. 90 capsule 4  . valACYclovir (VALTREX) 500 MG tablet TAKE 1 TABLET BY MOUTH DAILY 30 tablet 0  . cephALEXin (KEFLEX) 500 MG capsule Take 1 capsule (500 mg total) by mouth 2 (two) times daily. (Patient not taking: Reported on 05/14/2017) 10 capsule 0  . Daratumumab (DARZALEX IV) Inject into the vein.    [name] diltiazem (CARDIZEM) 60 MG tablet Take 60 mg by mouth as needed (raised heart rate).     . fluticasone (FLONASE) 50 MCG/ACT nasal spray Place 1 spray into the nose daily as needed for allergies.     [name] lidocaine-prilocaine (EMLA) cream Apply 1 application topically as needed. (Patient not taking: Reported on 07/02/2017) 30 g 0  . PAIN MANAGEMENT IT PUMP REFILL 1 each by Intrathecal route once. Medication: PF Fentanyl 1,500.0 mcg/ml PF Bupivicaine 30.0 mg/ml PF Clonidine 300.51mg/ml Total Volume: 40 ml Needed by 04-10-17 @ 1045 1 each 0  . prochlorperazine (COMPAZINE) 10 MG tablet Take 10 mg by mouth every 6 (six) hours as needed for nausea or vomiting.     No current facility-administered medications for this visit.    Facility-Administered Medications Ordered in Other Visits  Medication Dose Route Frequency Provider Last Rate Last Dose  . heparin lock flush 100 unit/mL  500 Units Intravenous Once Corcoran, Melissa C, MD      . sodium chloride flush (NS) 0.9 % injection 10 mL  10 mL Intravenous PRN CLequita Asal MD   10 mL at 07/02/17 0813    Review of Systems:  GENERAL: Feels "pretty good".   Energy level normal.  No fevers or sweats.  Weight up 4 pounds.  PERFORMANCE STATUS (ECOG): 1 HEENT:  Cataracts s/p surgery.  Altered taste sensation.  Runny nose.  No sore throat, mouth sores or tenderness. Lungs:  No shortness of breath.  No cough.  No hemoptysis. Cardiac:  No chest pain, palpitations, orthopnea, or PND. GI:  Appetite good. Diarrhea, managed (2 Imodium/day).  No vomiting, constipation, melena or hematochezia. GU:  No urgency, frequency, dysuria, or hematuria. Musculoskeletal:  Compression fracture of back on a pain pump. No muscle tenderness. Extremities:  No pain or swelling. Skin:  Fragile skin.  No rashes or skin changes. Neuro:  Little tremor (chronic).  Neuropathy in feet.  Sensitivity in hands (see HPI).  No headache, weakness, balance or coordination issues. Endocrine:  No diabetes, thyroid issues, hot flashes or night sweats. Psych:  No mood changes, depression or anxiety. Pain:  Chronic back pain (pain well managed with pump). Review of systems:  All other systems reviewed and found to be negative.   Physical Exam: Blood pressure 127/79, pulse 96, temperature (!) 95.4 F (35.2 C), temperature source Tympanic, resp. rate 18, weight 161 lb (73 kg). GENERAL:  Well developed, well nourished gentleman sitting comfortably in the exam room in no acute distress.  MENTAL STATUS:  Alert and oriented to person, place and time. HEAD:  Short brown hair.  Graying beard.  Normocephalic, atraumatic, face symmetric, no Cushingoid features. EYES:  Glasses.  Blue eyes s/p cataract surgery.  Pupils equal round and reactive to light and accomodation.  No conjunctivitis or scleral icterus. ENT:  Hearing aide.  Oropharynx clear without lesion.  Hearing aide.  Tongue normal. Mucous membranes moist.  RESPIRATORY:  Clear to auscultation without rales, wheezes or rhonchi. CARDIOVASCULAR:  Regular rate and rhythm without murmur, rub or gallop. ABDOMEN:  RUQ pain pump.  Soft, non-tender,  with  active bowel sounds, and no hepatosplenomegaly.  No masses. SKIN:  No rashes, ulcers or lesions. EXTREMITIES: No edema, skin discoloration or tenderness.  No palpable cords. LYMPH NODES: No palpable cervical, supraclavicular, axillary or inguinal adenopathy  NEUROLOGICAL: Intention tremor. PSYCH:  Appropriate.    Infusion on 07/02/2017  Component Date Value Ref Range Status  . WBC 07/02/2017 6.6  3.8 - 10.6 K/uL Final  . RBC 07/02/2017 3.27* 4.40 - 5.90 MIL/uL Final  . Hemoglobin 07/02/2017 11.7* 13.0 - 18.0 g/dL Final  . HCT 07/02/2017 32.7* 40.0 - 52.0 % Final  . MCV 07/02/2017 100.1* 80.0 - 100.0 fL Final  . MCH 07/02/2017 35.7* 26.0 - 34.0 pg Final  . MCHC 07/02/2017 35.7  32.0 - 36.0 g/dL Final  . RDW 07/02/2017 15.8* 11.5 - 14.5 % Final  . Platelets 07/02/2017 170  150 - 440 K/uL Final  . Neutrophils Relative % 07/02/2017 52  % Final  . Neutro Abs 07/02/2017 3.4  1.4 - 6.5 K/uL Final  . Lymphocytes Relative 07/02/2017 33  % Final  . Lymphs Abs 07/02/2017 2.2  1.0 - 3.6 K/uL Final  . Monocytes Relative 07/02/2017 12  % Final  . Monocytes Absolute 07/02/2017 0.8  0.2 - 1.0 K/uL Final  . Eosinophils Relative 07/02/2017 2  % Final  . Eosinophils Absolute 07/02/2017 0.1  0 - 0.7 K/uL Final  . Basophils Relative 07/02/2017 1  % Final  . Basophils Absolute 07/02/2017 0.0  0 - 0.1 K/uL Final  . Sodium 07/02/2017 138  135 - 145 mmol/L Final  . Potassium 07/02/2017 4.4  3.5 - 5.1 mmol/L Final  . Chloride 07/02/2017 104  101 - 111 mmol/L Final  . CO2 07/02/2017 28  22 - 32 mmol/L Final  . Glucose, Bld 07/02/2017 99  65 - 99 mg/dL Final  . BUN 07/02/2017 25* 6 - 20 mg/dL Final  . Creatinine, Ser 07/02/2017 1.36* 0.61 - 1.24 mg/dL Final  . Calcium 07/02/2017 9.4  8.9 - 10.3 mg/dL Final  . Total Protein 07/02/2017 6.0* 6.5 - 8.1 g/dL Final  . Albumin 07/02/2017 3.9  3.5 - 5.0 g/dL Final  . AST 07/02/2017 22  15 - 41 U/L Final  . ALT 07/02/2017 14* 17 - 63 U/L Final  . Alkaline  Phosphatase 07/02/2017 40  38 - 126 U/L Final  . Total Bilirubin 07/02/2017 0.6  0.3 - 1.2 mg/dL Final  . GFR calc non Af Amer 07/02/2017 51* >60 mL/min Final  . GFR calc Af Amer 07/02/2017 59* >60 mL/min Final   Comment: (NOTE) The eGFR has been calculated using the CKD EPI equation. This calculation has not been validated in all clinical situations. eGFR's persistently <60 mL/min signify possible Chronic Kidney Disease.   . Anion gap 07/02/2017 6  5 - 15 Final  . Magnesium 07/02/2017 2.1  1.7 - 2.4 mg/dL Final    Assessment:  Ifeanyichukwu Wickham is a 71 y.o. male with stage III mutiple myeloma.  He initially presented with progressive back pain beginning in 12/2006.  MRI revealed "spots and compression fractures".  He began Velcade, thalidomide, and Decadron.  In 08/2007, he underwent high dose chemotherapy and autologous stem cell transplant.  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  He recurred with a rising M-spike (2.7) with repeat M spike (1.7 gm/dl) in 03/2010.  He was initially treated with Velcade (02/08/2010 - 05/10/2010).  He then began Revlimid (15 mg 3 weeks on/1 week off) and Decadron (40 mg on  day 1, 8, 15, 22).  Because of significant side effect with Decadron his dose was decreased to 10 mg once a week in 07/2010.    He was on maintenance Revlimid. Revlimid was initially10 mg 3 weeks on/1 week off. This was changed to 10 mg 2 weeks on/2 weeks off secondary to right nipple tenderness. His dose was increased to 10 mg 3 weeks on/1 week off with Decadron 10 mg a week (on Sundays) and then Revlamid 15 mg 3 weeks on and 1 week off with Decadron on Sundays.  He began Pomalyst 4 mg 3 weeks on/1 week off with Decadron on 08/27/2015.  Over the past year his SPEP has revealed no monoclonal protein (04/21/2015) and 0.5 gm/dL on 09/22/2015 and 10/20/2015. M spike was 0.1 on 02/02/2016, 03/01/2016, 03/29/2016, 04/26/2016, and 05/24/2016.  M spike was 0 on  10/11/2016, 11/28/2016, 02/05/2017, and 03/21/2017.  Free light chains have been monitored. Kappa free light chains were 18.54 on 11/21/2013, 18.37 on 02/20/2014, 18.93 on 05/22/2014, 32.58 (high; normal ratio 1.27) on 08/21/2014, 51.53 (high; elevated ratio of 2.12) on 11/13/2014, 28.08 (ratio 1.73) on 12/09/2014, 23.71 (ratio 2.17) on 01/18/2015, 92.93 (ratio 9.49) on 04/21/2015, 93.44 (ratio 10.28) on 05/26/2015, 255.45 (ratio of 24.05) on 07/14/2015, 373.89 (ratio 48.31) on 08/04/2015, 474.33 (ratio 70.58) on 08/25/2015, 450.76 (ratio 55.44) on 09/22/2015, 453.4 (ratio 59.89) on 10/20/2015, 58.26 (ratio of > 40.74) on 02/02/2016, 62.67 (ratio of 37.98) on 03/01/2016, 75.7 (ratio > 50.47) on 03/29/2016, 79.7 (ratio 53.13) on 04/26/2016, 108.6 (ratio > 72.4) on 05/24/2016, 8.3 (1.46 ratio) on 10/11/2016, 6.7 (0.81 ratio) on 02/05/2017, and 7.8 (1.01 ratio) on 03/21/2017.  Bone survey on 12/08/2014 was stable.  Bone survey on 10/21/2015 revealed increase conspicuity of subcentimeter lytic lesions in the calvarium.  Bone marrow aspirate and biopsy on 11/04/2015 revealed an atypical monoclonal plasma cells estimated at 30-40% of marrow cells.   Marrow was variably cellular (approximately 45%) with background trilineage hematopoiesis. There was no significant increase in marrow reticulin fibers. Storage iron was present.    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 He develoed herpes zoster in 04/2008. He developed a pulmonary embolism in 05/2013. He was initially on Xarelto, but is now on Eliquis. He had an episode of pneumonia around this time requiring a brief admission. He developed severe lower leg cramps on 08/07/2014 secondary to hypokalemia. Duplex was negative.   He was treated for C difficile colitis (Flagyl completed 07/30/2015).  He has a chronic indwelling pain pump.  He received 4 cycles of Pomalyst and Decadron (08/27/2015 -  11/19/2015).  Restaging studies document progressive disease.  Kappa free light chains are increasing.  SPEP revealed 0.5 gm/dL monoclonal protein then 1.3 gm/dL.  Bone survey reveals increase conspicuity of subcentimeter lytic lesions in the calvarium.  Bone marrow reveals 30-40% plasma cells.   MUGA on 11/30/2015 revealed an ejection fraction of 46%.  He is felt not to be a good candidate for Kyprolis.  There were no focal wall motion abnormalities.  He had a stress echo less than 1 year ago.  He has a history of PVCs and atrial fibrillation.  He takes Cardizem prn.  He received 17 weeks of daratumumab (Darzalex) (12/09/2015 - 05/25/2016).  He tolerated treatment well without side effect.  Bone marrow on 02/09/2016 revealed no diagnostic morphologic evidence of plasma cell myeloma.  Marrow was normocellular to hypocellular marrow for age (ranging from 10-40%) with maturing trilineage hematopoiesis and mild multilineage  dyspoiesis.  There was patchy mild increase in reticulin.  Storage iron was present.  Flow cytometry revealed no definitive evidence of monoclonality.  There was a non-specific atypical myeloid and monocytic findings with no increase in blasts.  Cytogenetics were normal (46, XY).  He is currently day 382 s/p 2nd autologous stem cell transplant on 06/16/2016.  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  He also experienced atrial fibrillation with intermittent episodes of RVR requiring IV beta blockers.  He is on prophylactic valacyclovir for 1 year post transplant.  He started vaccinations (DTaP-Pediatric triple vaccine, Hep B- Pediatrix triple vaccine, Haemophilus influenza B (Hib), inactivated polio virus (IPV), pneumococcal conjugate vaccine 13-valent (PCV 13)) on 04/17/2017.  Recommendation was for Shingrix vaccine to be given in [employer] (not done) and second dose of Shingrix in 2 months.  He has follow-up vaccinations in 3 months  (07/18/2017).    He is s/p 6 cycles of daratumumab (Darzalex) post transplant (12/05/2016 - 06/01/2017).  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  Work-up on 08/23/2016 revealed the following normal studies:  B12 and folate.  Ferritin was 1379 (high).  Iron studies included a saturation of 56% and TIBC 141 (low).  ESR was 64.  Thrombocytopenia has resolved.  He has a history of persistent hypomagnesemia and receives IV magnesium (2-4 gm) weekly.  He has not required magnesium since 10/24/2016.  Symptomatically, he feels "pretty good".  He is gaining weight. He continues to have diarrhea that is controlled with Imodium 2/day. Exam is unremarkable. WBC 6600 with an ANC of 3400. Hemoglobin 11.7, hematocrit 32.7, platelets 170,000.  Plan:  1.  Labs today:  CBC with diff, CMP, Mg.  2.  Patient to take premeds:  ondansetron (8 mg), Tylenol (650 mg), and Benadryl (50 mg). 3.  Cycle #7 daratumumab today. 4.  Discuss influenza vaccination. Patient's blood counts are adequate enough for him to be vaccinated. Patient encouraged to hold off until next week after his treatment.  5.  RTC in 1 month for MD assessment, labs (CBC with diff, CMP, Mg, SPEP), and cycle #8 daratumumab.   Honor Loh, FNP-C 07/02/2017, 9:02 AM   I saw and evaluated the patient, participating in the key portions of the service and reviewing pertinent diagnostic studies and records.  I reviewed the nurse practitioner's note and agree with the findings and the plan.  The assessment and plan were discussed with the patient.  Multiple questions were asked by the patient and answered.   Logan Asal, MD 07/02/2017, 9:02 AM

## 2017-07-02 NOTE — Progress Notes (Signed)
MD ok to change to rapid infusion darzalex.

## 2017-07-02 NOTE — Progress Notes (Signed)
Per MD, Dr. Mike Gip: patient has all pre-medications needed for Darzalex with him. Patient will take these prior to infusion and does not need any extra pre-medications ordered today. Patient took pre-medications at [phone].

## 2017-07-03 ENCOUNTER — Other Ambulatory Visit: Payer: Self-pay | Admitting: Urology

## 2017-07-03 ENCOUNTER — Other Ambulatory Visit: Payer: Self-pay | Admitting: Hematology and Oncology

## 2017-07-03 DIAGNOSIS — C9001 Multiple myeloma in remission: Secondary | ICD-10-CM

## 2017-07-04 ENCOUNTER — Other Ambulatory Visit: Payer: Self-pay

## 2017-07-04 MED ORDER — PAIN MANAGEMENT IT PUMP REFILL: 1.0000 | Freq: Once | INTRATHECAL | 0 refills | Status: DC

## 2017-07-18 ENCOUNTER — Other Ambulatory Visit: Payer: Self-pay | Admitting: *Deleted

## 2017-07-18 ENCOUNTER — Other Ambulatory Visit: Payer: Self-pay | Admitting: Hematology and Oncology

## 2017-07-18 DIAGNOSIS — C9001 Multiple myeloma in remission: Secondary | ICD-10-CM

## 2017-07-18 MED ORDER — ALPRAZOLAM 0.25 MG PO TABS: ORAL_TABLET | ORAL | 1 refills | Status: DC

## 2017-07-19 ENCOUNTER — Telehealth: Payer: Self-pay | Admitting: *Deleted

## 2017-07-19 NOTE — Telephone Encounter (Signed)
Rn spoke with patient and informed him that there atwas a prescription at the front desk in [place].  Pt verbalized understanding.    Cyndia Diver RN

## 2017-07-31 ENCOUNTER — Other Ambulatory Visit: Payer: Self-pay | Admitting: Hematology and Oncology

## 2017-07-31 ENCOUNTER — Encounter: Payer: Self-pay | Admitting: Pain Medicine

## 2017-07-31 ENCOUNTER — Ambulatory Visit: Payer: Medicare Other | Attending: Pain Medicine | Admitting: Pain Medicine

## 2017-07-31 VITALS — BP 124/83 | HR 93 | Temp 98.0°F | Resp 18 | Ht 67.0 in | Wt 162.0 lb

## 2017-07-31 DIAGNOSIS — M4854XA Collapsed vertebra, not elsewhere classified, thoracic region, initial encounter for fracture: Secondary | ICD-10-CM | POA: Insufficient documentation

## 2017-07-31 DIAGNOSIS — C419 Malignant neoplasm of bone and articular cartilage, unspecified: Secondary | ICD-10-CM | POA: Insufficient documentation

## 2017-07-31 DIAGNOSIS — Z7952 Long term (current) use of systemic steroids: Secondary | ICD-10-CM | POA: Diagnosis not present

## 2017-07-31 DIAGNOSIS — Z7901 Long term (current) use of anticoagulants: Secondary | ICD-10-CM | POA: Insufficient documentation

## 2017-07-31 DIAGNOSIS — Z978 Presence of other specified devices: Secondary | ICD-10-CM

## 2017-07-31 DIAGNOSIS — G894 Chronic pain syndrome: Secondary | ICD-10-CM | POA: Insufficient documentation

## 2017-07-31 DIAGNOSIS — Z79891 Long term (current) use of opiate analgesic: Secondary | ICD-10-CM | POA: Diagnosis not present

## 2017-07-31 DIAGNOSIS — Z888 Allergy status to other drugs, medicaments and biological substances status: Secondary | ICD-10-CM | POA: Diagnosis not present

## 2017-07-31 DIAGNOSIS — C9001 Multiple myeloma in remission: Secondary | ICD-10-CM

## 2017-07-31 DIAGNOSIS — Z79899 Other long term (current) drug therapy: Secondary | ICD-10-CM | POA: Insufficient documentation

## 2017-07-31 DIAGNOSIS — Z9484 Stem cells transplant status: Secondary | ICD-10-CM | POA: Insufficient documentation

## 2017-07-31 DIAGNOSIS — M545 Low back pain: Secondary | ICD-10-CM | POA: Diagnosis not present

## 2017-07-31 DIAGNOSIS — Z462 Encounter for fitting and adjustment of other devices related to nervous system and special senses: Secondary | ICD-10-CM | POA: Insufficient documentation

## 2017-07-31 DIAGNOSIS — Z95828 Presence of other vascular implants and grafts: Secondary | ICD-10-CM

## 2017-07-31 DIAGNOSIS — G893 Neoplasm related pain (acute) (chronic): Secondary | ICD-10-CM

## 2017-07-31 DIAGNOSIS — Z451 Encounter for adjustment and management of infusion pump: Secondary | ICD-10-CM | POA: Diagnosis not present

## 2017-07-31 DIAGNOSIS — M62838 Other muscle spasm: Secondary | ICD-10-CM

## 2017-07-31 DIAGNOSIS — S22080A Wedge compression fracture of T11-T12 vertebra, initial encounter for closed fracture: Secondary | ICD-10-CM

## 2017-07-31 MED ORDER — BACLOFEN 10 MG PO TABS: 10.0000 mg | ORAL_TABLET | Freq: Every day | ORAL | 5 refills | Status: DC

## 2017-07-31 NOTE — Patient Instructions (Signed)
____________________________________________________________________________________________  Post-Procedure instructions Instructions:  Apply ice: Fill a plastic sandwich bag with crushed ice. Cover it with a small towel and apply to injection site. Apply for 15 minutes then remove x 15 minutes. Repeat sequence on day of procedure, until you go to bed. The purpose is to minimize swelling and discomfort after procedure.  Apply heat: Apply heat to procedure site starting the day following the procedure. The purpose is to treat any soreness and discomfort from the procedure.  Food intake: Start with clear liquids (like water) and advance to regular food, as tolerated.   Physical activities: Keep activities to a minimum for the first 8 hours after the procedure.   Driving: If you have received any sedation, you are not allowed to drive for 24 hours after your procedure.  Blood thinner: Restart your blood thinner 6 hours after your procedure. (Only for those taking blood thinners)  Insulin: As soon as you can eat, you may resume your normal dosing schedule. (Only for those taking insulin)  Infection prevention: Keep procedure site clean and dry.  Post-procedure Pain Diary: Extremely important that this be done correctly and accurately. Recorded information will be used to determine the next step in treatment.  Pain evaluated is that of treated area only. Do not include pain from an untreated area.  Complete every hour, on the hour, for the initial 8 hours. Set an alarm to help you do this part accurately.  Do not go to sleep and have it completed later. It will not be accurate.  Follow-up appointment: Keep your follow-up appointment after the procedure. Usually 2 weeks for most procedures. (6 weeks in the case of radiofrequency.) Bring you pain diary.  Expect:  From numbing medicine (AKA: Local Anesthetics): Numbness or decrease in pain.  Onset: Full effect within 15 minutes of  injected.  Duration: It will depend on the type of local anesthetic used. On the average, 1 to 8 hours.    From procedure: Some discomfort is to be expected once the numbing medicine wears off. This should be minimal if ice and heat are applied as instructed. Call if:  You experience numbness and weakness that gets worse with time, as opposed to wearing off.  New onset bowel or bladder incontinence. (Spinal procedures only)  Emergency Numbers:  Durning business hours (Monday - Thursday, 8:00 AM - 4:00 PM) (Friday, 9:00 AM - 12:00 Noon): [phone]  After hours: [phone] ____________________________________________________________________________________________   

## 2017-07-31 NOTE — Progress Notes (Signed)
Patient's Name: Logan Perez  MRN: [id number]  Referring Provider: Sofie Hartigan, MD  DOB: [birth year]  PCP: Sofie Hartigan, MD  DOS: 07/31/2017  Note by: Gaspar Cola, MD  Service setting: Ambulatory outpatient  Specialty: Interventional Pain Management  Patient type: Established  Location: [facility] (AMB) Pain Management Facility  Visit type: Interventional Procedure   Primary Reason for Visit: Interventional Pain Management Treatment. CC: Back Pain (lower)  Procedure:  Intrathecal Drug Delivery System (IDDS):  Type: Reservoir Refill [phone])       Region: Abdominal Laterality: Right  Type of Pump: Medtronic Synchromed II (MRI-compatible) Delivery Route: Intrathecal Type of Pain Treated: Neuropathic/Nociceptive Primary Medication Class: Opioid/opiate  Medication, Concentration, Infusion Program, & Delivery Rate: Please see scanned programming printout.   Indications: 1. Cancer associated pain   2. Malignant neoplasm of bone, unspecified location ([place])   3. Compression fracture of T12 vertebra (HCC) (70-75% magnitude) (with mild retropulsion)   4. Chronic pain syndrome   5. Encounter for adjustment or management of infusion pump   6. Presence of implanted infusion pump (Medtronic, programmable, intrathecal pump)   7. Presence of intrathecal pump   8. Long term prescription opiate use   9. Night muscle spasms   10. Muscle spasticity   11. End of battery life of intrathecal infusion pump    Pain Assessment: Self-Reported Pain Score: 2 /10             Reported level is compatible with observation.        Intrathecal Pump Therapy Assessment  Manufacturer: Medtronic Synchromed II Type: Programmable Volume: 40 mL reservoir MRI compatibility: Yes   Drug content:  Primary Medication Class: Opioid Primary Medication: PF-Fentanyl Secondary Medication: PF-Bupivacaine Other Medication: PF-Clonidine   Programming:  Type: Simple continuous. See pump readout for  details.   Changes:  Medication Change: None at this point Rate Change: No change in rate  Reported side-effects or adverse reactions: None reported  Effectiveness: Described as relatively effective, allowing for increase in activities of daily living (ADL) Clinically meaningful improvement in function (CMIF): Sustained CMIF goals met  Plan: Pump refill today  Pre-op Assessment:  [name] is a 71 y.o. (year old), male patient, seen today for interventional treatment. He  has a past surgical history that includes Back surgery (1994); Knee arthroscopy (Left, 1992); Pain pump implantation (2012); stem cell implant (2008); Cataract extraction w/PHACO (Right, 05/02/2016); Cataract extraction w/PHACO (Left, 05/16/2016); Limbal stem cell transplant; PORTA CATH INSERTION (N/A, 12/04/2016); Colonoscopy with propofol (N/A, 03/22/2017); and Esophagogastroduodenoscopy (egd) with propofol (03/22/2017). [name] has a current medication list which includes the following prescription(s): acetaminophen, alprazolam, baclofen, calcium citrate-vitamin d, cetirizine, cholecalciferol, cyanocobalamin, daratumumab, dexamethasone, diltiazem, diltiazem, diphenhydramine, eliquis, ensure, fluticasone, iron-vitamins, loperamide, lovastatin, montelukast, naproxen sodium, omeprazole, potassium chloride sa, ranitidine, shingrix, tamsulosin, valacyclovir, and PAIN MANAGEMENT IT PUMP REFILL. His primarily concern today is the Back Pain (lower)  According to the pump readout, he will probably be needing a pump replacement before December 2018. We'll start the process of requesting preapproval for the pump replacement.  Initial Vital Signs: There were no vitals taken for this visit. BMI: Estimated body mass index is 25.37 kg/m as calculated from the following:   Height as of this encounter: '5\' 7"'  (1.702 m).   Weight as of this encounter: 162 lb (73.5 kg).  Risk Assessment: Allergies: Reviewed. He is allergic to azithromycin;  bee pollen; pollen extract; zometa [zoledronic acid]; and rivaroxaban.  Allergy Precautions: None required Coagulopathies: Reviewed. None identified.  Blood-thinner therapy: None at this time Active Infection(s): Reviewed. None identified. [name] is afebrile  Site Confirmation: [name] was asked to confirm the procedure and laterality before marking the site Procedure checklist: Completed Consent: Before the procedure and under the influence of no sedative(s), amnesic(s), or anxiolytics, the patient was informed of the treatment options, risks and possible complications. To fulfill our ethical and legal obligations, as recommended by the American Medical Association's Code of Ethics, I have informed the patient of my clinical impression; the nature and purpose of the treatment or procedure; the risks, benefits, and possible complications of the intervention; the alternatives, including doing nothing; the risk(s) and benefit(s) of the alternative treatment(s) or procedure(s); and the risk(s) and benefit(s) of doing nothing.  [name] was provided with information about the general risks and possible complications associated with most interventional procedures. These include, but are not limited to: failure to achieve desired goals, infection, bleeding, organ or nerve damage, allergic reactions, paralysis, and/or death.  In addition, he was informed of those risks and possible complications associated to this particular procedure, which include, but are not limited to: damage to the implant; failure to decrease pain; local, systemic, or serious CNS infections, intraspinal abscess with possible cord compression and paralysis, or life-threatening such as meningitis; bleeding; organ damage; nerve injury or damage with subsequent sensory, motor, and/or autonomic system dysfunction, resulting in transient or permanent pain, numbness, and/or weakness of one or several areas of the body; allergic  reactions, either minor or major life-threatening, such as anaphylactic or anaphylactoid reactions.  Furthermore, [name] was informed of those risks and complications associated with the medications. These include, but are not limited to: allergic reactions (i.e.: anaphylactic or anaphylactoid reactions); endorphine suppression; bradycardia and/or hypotension; water retention and/or peripheral vascular relaxation leading to lower extremity edema and possible stasis ulcers; respiratory depression and/or shortness of breath; decreased metabolic rate leading to weight gain; swelling or edema; medication-induced neural toxicity; particulate matter embolism and blood vessel occlusion with resultant organ, and/or nervous system infarction; and/or intrathecal granuloma formation with possible spinal cord compression and permanent paralysis.  Before refilling the pump Mr. Marian was informed that some of the medications used in the devise may not be FDA approved for such use and therefore it constitutes an off-label use of the medications.  Finally, he was informed that Medicine is not an exact science; therefore, there is also the possibility of unforeseen or unpredictable risks and/or possible complications that may result in a catastrophic outcome. The patient indicated having understood very clearly. We have given the patient no guarantees and we have made no promises. Enough time was given to the patient to ask questions, all of which were answered to the patient's satisfaction. Mr. Lamantia has indicated that he wanted to continue with the procedure. Attestation: I, the ordering provider, attest that I have discussed with the patient the benefits, risks, side-effects, alternatives, likelihood of achieving goals, and potential problems during recovery for the procedure that I have provided informed consent. Date: 07/31/2017; Time: 7:57 AM  Pre-Procedure Preparation:  Monitoring: As per clinic protocol.  Respiration, ETCO2, SpO2, BP, heart rate and rhythm monitor placed and checked for adequate function Safety Precautions: Patient was assessed for positional comfort and pressure points before starting the procedure. Time-out: I initiated and conducted the "Time-out" before starting the procedure, as per protocol. The patient was asked to participate by confirming the accuracy of the "Time Out" information. Verification of the correct person, site, and procedure were  performed and confirmed by me, the nursing staff, and the patient. "Time-out" conducted as per Joint Commission's Universal Protocol (UP.01.01.01). "Time-out" Date & Time: 07/31/2017; 1146 hrs.  Description of Procedure Process:   Position: Supine Target Area: Central-port of intrathecal pump. Approach: Anterior, 90 degree angle approach. Area Prepped: Entire Area around the pump implant. Prepping solution: ChloraPrep (2% chlorhexidine gluconate and 70% isopropyl alcohol) Safety Precautions: Aspiration looking for blood return was conducted prior to all injections. At no point did we inject any substances, as a needle was being advanced. No attempts were made at seeking any paresthesias. Safe injection practices and needle disposal techniques used. Medications properly checked for expiration dates. SDV (single dose vial) medications used. Description of the Procedure: Protocol guidelines were followed. Two nurses trained to do implant refills were present during the entire procedure. The refill medication was checked by both healthcare providers as well as the patient. The patient was included in the "Time-out" to verify the medication. The patient was placed in position. The pump was identified. The area was prepped in the usual manner. The sterile template was positioned over the pump, making sure the side-port location matched that of the pump. Both, the pump and the template were held for stability. The needle provided in the Medtronic  Kit was then introduced thru the center of the template and into the central port. The pump content was aspirated and discarded volume documented. The new medication was slowly infused into the pump, thru the filter, making sure to avoid overpressure of the device. The needle was then removed and the area cleansed, making sure to leave some of the prepping solution back to take advantage of its long term bactericidal properties. The pump was interrogated and programmed to reflect the correct medication, volume, and dosage. The program was printed and taken to the physician for approval. Once checked and signed by the physician, a copy was provided to the patient and another scanned into the EMR. Vitals:   07/31/17 1125  BP: 124/83  Pulse: 93  Resp: 18  Temp: 98 F (36.7 C)  TempSrc: Oral  SpO2: 100%  Weight: 162 lb (73.5 kg)  Height: '5\' 7"'  (1.702 m)    Start Time: 1155 hrs. End Time: 1202 hrs. Materials & Medications: Medtronic Refill Kit Medication(s): Please see chart orders for details.  Imaging Guidance:  Type of Imaging Technique: None used Indication(s): N/A Exposure Time: No patient exposure Contrast: None used. Fluoroscopic Guidance: N/A Ultrasound Guidance: N/A Interpretation: N/A  Antibiotic Prophylaxis:  Indication(s): None identified Antibiotic given: None  Post-operative Assessment:  EBL: None Complications: No immediate post-treatment complications observed by team, or reported by patient. Note: The patient tolerated the entire procedure well. A repeat set of vitals were taken after the procedure and the patient was kept under observation following institutional policy, for this type of procedure. Post-procedural neurological assessment was performed, showing return to baseline, prior to discharge. The patient was provided with post-procedure discharge instructions, including a section on how to identify potential problems. Should any problems arise concerning this  procedure, the patient was given instructions to immediately contact us, at any time, without hesitation. In any case, we plan to contact the patient by telephone for a follow-up status report regarding this interventional procedure. Comments:  No additional relevant information.  Plan of Care   Imaging Orders  No imaging studies ordered today    Procedure Orders     PUMP REFILL     PUMP REFILL  Remove and insert drug implant  Medications ordered for procedure: Meds ordered this encounter  Medications  . baclofen (LIORESAL) 10 MG tablet    Sig: Take 1 tablet (10 mg total) by mouth at bedtime.    Dispense:  30 tablet    Refill:  5    Do not place this medication, or any other prescription from our practice, on "Automatic Refill". Patient may have prescription filled one day early if pharmacy is closed on scheduled refill date.   Medications administered: Mr. Marcell had no medications administered during this visit.  See the medical record for exact dosing, route, and time of administration.  New Prescriptions   No medications on file   Disposition: Discharge home  Discharge Date & Time: 07/31/2017; 1215 hrs.   Physician-requested Follow-up: Return for Revision and replacement of Medtronic intrathecal pump. Future Appointments Date Time Provider [place]  08/02/2017 8:15 AM [facility] None  08/02/2017 8:30 AM Lequita Asal, MD [facility] None  08/02/2017 9:00 AM [facility] INFUSION CHAIR 10 [facility] None  05/02/2018 8:30 AM McGowan, Hunt Oris, PA-C [facility] None   Primary Care Physician: Sofie Hartigan, MD Location: [facility] Note by: Gaspar Cola, MD Date: 07/31/2017; Time: 12:28 PM  Disclaimer:  Medicine is not an exact science. The only guarantee in medicine is that nothing is guaranteed. It is important to note that the decision to proceed with this intervention was based on the information  collected from the patient. The Data and conclusions were drawn from the patient's questionnaire, the interview, and the physical examination. Because the information was provided in large part by the patient, it cannot be guaranteed that it has not been purposely or unconsciously manipulated. Every effort has been made to obtain as much relevant data as possible for this evaluation. It is important to note that the conclusions that lead to this procedure are derived in large part from the available data. Always take into account that the treatment will also be dependent on availability of resources and existing treatment guidelines, considered by other Pain Management Practitioners as being common knowledge and practice, at the time of the intervention. For Medico-Legal purposes, it is also important to point out that variation in procedural techniques and pharmacological choices are the acceptable norm. The indications, contraindications, technique, and results of the above procedure should only be interpreted and judged by a Board-Certified Interventional Pain Specialist with extensive familiarity and expertise in the same exact procedure and technique.

## 2017-08-01 ENCOUNTER — Telehealth: Payer: Self-pay | Admitting: *Deleted

## 2017-08-01 MED FILL — Medication: INTRATHECAL | Qty: 1 | Status: AC

## 2017-08-01 NOTE — Telephone Encounter (Signed)
No problems after pump refill.

## 2017-08-01 NOTE — Progress Notes (Signed)
[facility] day:  08/02/17  Chief Complaint: Logan Perez is a 71 y.o. 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   HPI: The patient was last seen in the medical oncology clinic on 07/02/2017.  At that time, he felt "alright". Patient noted chronic back pain. He continued to have issues with his bowels requiring the use of Loperamide. Patient was eating well and gained 4 pounds since his last visit. Patient had advised that he was recently diagnosed with a basal cell carcinoma and was scheduled for a Mohs procedure in December of this year. He had received his shingles vaccination on 06/12/2017. CBC revealed white count 6660 with an [place] of 3400. Hemoglobin was 11.7, hematocrit 32.7,and platelets 170,000. Renal function had slightly increased (BUN 25 and Cr 1.36). Magnesium was normal. Patient received cycle #7 Darzalex.  In the interim, patient has been doing "really great". He denies any new complaints since his last clinic visit. Patient continues to have loose stools (2 per day). He is still using Loperamide PRN. Patient denies any B symptoms or interval infections. Patient denies any bruising or bleeding. He is eating well with no demonstrated weight loss.  He had his pain pump refilled last Tuesday.  He is due to have his pain pump replaced soon.   Past Medical History:  Diagnosis Date  . Anxiety   . Atrial fibrillation ([place])   . BPH (benign prostatic hyperplasia)   . Complication of anesthesia    BAD HEADACHE NIGHT OF FIRST CATARACT  . Compression fracture of lumbar vertebra ([place])   . Difficulty voiding   . Dysrhythmia    A FIB  . Elevated PSA   . GERD (gastroesophageal reflux disease)   . Hearing aid worn    bilateral  . HLD (hyperlipidemia)   . HOH (hard of hearing)   .  Hypertension   . Multiple myeloma ([place])   . Neuropathy    feet. 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  . Pain    BACK  . Palpitations   . Pneumonia   . Pulmonary embolism ([place])   . Sepsis ([place])   . Stroke [facility])    TIA, detected on CT scan. pt was unaware    Past Surgical History:  Procedure Laterality Date  . BACK SURGERY  1994  . CATARACT EXTRACTION W/PHACO Right 05/02/2016   Procedure: CATARACT EXTRACTION PHACO AND INTRAOCULAR LENS PLACEMENT (IOC);  Surgeon: Birder Robson, MD;  Location: [facility] ORS;  Service: Ophthalmology;  Laterality: Right;  [place] 1.06 AP% 20.6 CDE 13.70 FLUID PACK LOT # [id number]  . CATARACT EXTRACTION W/PHACO Left 05/16/2016   Procedure: CATARACT EXTRACTION PHACO AND INTRAOCULAR LENS PLACEMENT (IOC);  Surgeon: Birder Robson, MD;  Location: [facility] ORS;  Service: Ophthalmology;  Laterality: Left;  [place] 01:43 AP% 19.8 CDE 20.45 FLUID PACK LOT #[id number] H  . COLONOSCOPY WITH PROPOFOL N/A 03/22/2017   Procedure: COLONOSCOPY WITH PROPOFOL;  Surgeon: Lucilla Lame, MD;  Location: [facility];  Service: Endoscopy;  Laterality: N/A;  has port  . ESOPHAGOGASTRODUODENOSCOPY (EGD) WITH PROPOFOL  03/22/2017   Procedure: ESOPHAGOGASTRODUODENOSCOPY (EGD) WITH PROPOFOL;  Surgeon: Lucilla Lame, MD;  Location: [facility];  Service: Endoscopy;;  . KNEE ARTHROSCOPY Left 1992  . LIMBAL STEM CELL TRANSPLANT    . PAIN PUMP IMPLANTATION  2012  . PORTA CATH INSERTION  N/A 12/04/2016   Procedure: Glori Luis Cath Insertion;  Surgeon: Algernon Huxley, MD;  Location: [facility];  Service: Cardiovascular;  Laterality: N/A;  . stem cell implant  2008   [facility]    Family History  Problem Relation Age of Onset  . Cancer Father        throat  . Kidney disease Sister   . Stroke Unknown   . Bladder Cancer Neg Hx   . Prostate cancer Neg Hx   . Kidney cancer Neg Hx     Social History:  reports that he quit smoking about 41 years ago. His smoking use included Cigarettes. He has a 30.00 pack-year  smoking history. He has never used smokeless tobacco. He reports that he does not drink alcohol or use drugs.  He has a wire haired dachshund.  He lives in [place].  His wife's name is [name].  The patient is alone today.  Allergies:  Allergies  Allergen Reactions  . Azithromycin Diarrhea    Possible cause of C. Diff  . Bee Pollen     Other reaction(s): Other (See Comments) Sneezing, watery eyes, runny nose  . Pollen Extract Other (See Comments)    Sneezing, watery eyes, runny nose  . Zometa [Zoledronic Acid] Other (See Comments)    ONG- Osteonecrosis of the jaw   . Rivaroxaban Rash    Current Medications: Current Outpatient Prescriptions  Medication Sig Dispense Refill  . acetaminophen (TYLENOL) 500 MG tablet Take 1,000 mg by mouth every 8 (eight) hours as needed for mild pain or headache.     . ALPRAZolam (XANAX) 0.25 MG tablet TAKE 1 TABLET BY MOUTH EVERY NIGHT AT BEDTIME, AS NEEDED FOR ANXIETY 30 tablet 1  . baclofen (LIORESAL) 10 MG tablet Take 1 tablet (10 mg total) by mouth at bedtime. 30 tablet 5  . calcium citrate-vitamin D (CITRACAL+D) 315-200 MG-UNIT per tablet Take 1 tablet by mouth daily.     . cetirizine (ZYRTEC) 10 MG tablet Take 10 mg by mouth daily as needed for allergies.     . Cholecalciferol (VITAMIN D3 PO) Take by mouth daily.    . Cyanocobalamin (VITAMIN B-12 PO) Take by mouth daily.    . Daratumumab (DARZALEX IV) Inject into the vein.    [name] dexamethasone (DECADRON) 4 MG tablet Take 4 tabs PO 1 hr prior to infusion, then take 1 tab on day 2 and day 3 30 tablet 1  . diltiazem (CARDIZEM CD) 120 MG 24 hr capsule Take by mouth.    . diltiazem (CARDIZEM) 60 MG tablet Take 60 mg by mouth as needed (raised heart rate).     . diphenhydrAMINE (BENADRYL) 25 MG tablet Take by mouth.    Arne Cleveland 5 MG TABS tablet TAKE 1 TABLET(5 MG) BY MOUTH TWICE DAILY 60 tablet 0  . ENSURE (ENSURE) Take 237 mLs by mouth daily.    . fluticasone (FLONASE) 50 MCG/ACT nasal spray Place 1  spray into the nose daily as needed for allergies.     . Iron-Vitamins (GERITOL PO) Take 1 tablet by mouth daily.     [name] loperamide (IMODIUM) 2 MG capsule Take 2-4 mg by mouth as needed for diarrhea or loose stools. Reported on 04/26/2016    . lovastatin (MEVACOR) 20 MG tablet Take 20 mg by mouth every evening.     . montelukast (SINGULAIR) 10 MG tablet TAKE 1 TABLET BY MOUTH ONCE A DAY AS DIRECTED. (Patient taking differently: Take 36ms daily the day before the  infusion, the day of, and the 2 days after infusion) 30 tablet 0  . naproxen sodium (ANAPROX) 220 MG tablet Take 220-440 mg by mouth 2 (two) times daily as needed (pain).    [name] omeprazole (PRILOSEC) 20 MG capsule Take 20 mg by mouth daily.     . potassium chloride SA (K-DUR,KLOR-CON) 20 MEQ tablet TAKE 1 TABLET(20 MEQ) BY MOUTH DAILY 90 tablet 0  . ranitidine (ZANTAC) 150 MG tablet Take 150 mg by mouth at bedtime.     . tamsulosin (FLOMAX) 0.4 MG CAPS capsule Take 1 capsule (0.4 mg total) by mouth daily. 90 capsule 4  . valACYclovir (VALTREX) 500 MG tablet TAKE 1 TABLET BY MOUTH DAILY 30 tablet 0  . PAIN MANAGEMENT IT PUMP REFILL 1 each by Intrathecal route once. Medication: PF Fentanyl 1,500.0 mcg/ml PF Bupivicaine 30.0 mg/ml PF Clonidine 300.0 mcg/ml Total Volume: 40 ml Needed by 07-31-17 @ 1000 1 each 0   No current facility-administered medications for this visit.    Facility-Administered Medications Ordered in Other Visits  Medication Dose Route Frequency Provider Last Rate Last Dose  . heparin lock flush 100 unit/mL  500 Units Intravenous Once Lequita Asal, MD        Review of Systems:  GENERAL: Feels "really great".  Energy level normal.  No fevers or sweats.  Weight is up 1 pound.  PERFORMANCE STATUS (ECOG): 1 HEENT:  Cataracts s/p surgery.  Altered taste sensation.  Runny nose.  No sore throat, mouth sores or tenderness. Lungs:  No shortness of breath.  No cough.  No hemoptysis. Cardiac:  No chest pain,  palpitations, orthopnea, or PND. GI:  Appetite good. Diarrhea, managed (2 Imodium/day).  No vomiting, constipation, melena or hematochezia. GU:  No urgency, frequency, dysuria, or hematuria. Musculoskeletal:  Compression fracture of back on a pain pump. No muscle tenderness. Extremities:  No pain or swelling. Skin:  Fragile skin.  No rashes or skin changes. Neuro:  Little tremor (chronic).  Neuropathy in feet.  Sensitivity in hands.  No headache, weakness, balance or coordination issues. Endocrine:  No diabetes, thyroid issues, hot flashes or night sweats. Psych:  No mood changes, depression or anxiety. Pain:  Chronic back pain (pain well managed with pump). Review of systems:  All other systems reviewed and found to be negative.   Physical Exam: Blood pressure 127/84, pulse 90, temperature (!) 95.9 F (35.5 C), temperature source Tympanic, resp. rate 18, weight 163 lb (73.9 kg). GENERAL:  Well developed, well nourished gentleman sitting comfortably in the exam room in no acute distress.  MENTAL STATUS:  Alert and oriented to person, place and time. HEAD:  Short brown hair.  Graying goatee.  Normocephalic, atraumatic, face symmetric, no Cushingoid features. EYES:  Glasses.  Blue eyes s/p cataract surgery.  Pupils equal round and reactive to light and accomodation.  No conjunctivitis or scleral icterus. ENT:  Hearing aide.  Oropharynx clear without lesion.  Hearing aide.  Tongue normal. Mucous membranes moist.  RESPIRATORY:  Clear to auscultation without rales, wheezes or rhonchi. CARDIOVASCULAR:  Regular rate and rhythm without murmur, rub or gallop. ABDOMEN:  RUQ pain pump.  Soft, non-tender, with active bowel sounds, and no hepatosplenomegaly.  No masses. SKIN:  No rashes, ulcers or lesions. EXTREMITIES: No edema, skin discoloration or tenderness.  No palpable cords. LYMPH NODES: No palpable cervical, supraclavicular, axillary or inguinal adenopathy  NEUROLOGICAL: Intention  tremor. PSYCH:  Appropriate.    Infusion on 08/02/2017  Component Date Value Ref Range Status  .  WBC 08/02/2017 7.3  3.8 - 10.6 K/uL Final  . RBC 08/02/2017 3.54* 4.40 - 5.90 MIL/uL Final  . Hemoglobin 08/02/2017 12.4* 13.0 - 18.0 g/dL Final  . HCT 08/02/2017 35.7* 40.0 - 52.0 % Final  . MCV 08/02/2017 101.0* 80.0 - 100.0 fL Final  . MCH 08/02/2017 34.9* 26.0 - 34.0 pg Final  . MCHC 08/02/2017 34.6  32.0 - 36.0 g/dL Final  . RDW 08/02/2017 15.0* 11.5 - 14.5 % Final  . Platelets 08/02/2017 204  150 - 440 K/uL Final  . Neutrophils Relative % 08/02/2017 57  % Final  . Neutro Abs 08/02/2017 4.1  1.4 - 6.5 K/uL Final  . Lymphocytes Relative 08/02/2017 30  % Final  . Lymphs Abs 08/02/2017 2.2  1.0 - 3.6 K/uL Final  . Monocytes Relative 08/02/2017 11  % Final  . Monocytes Absolute 08/02/2017 0.8  0.2 - 1.0 K/uL Final  . Eosinophils Relative 08/02/2017 1  % Final  . Eosinophils Absolute 08/02/2017 0.1  0 - 0.7 K/uL Final  . Basophils Relative 08/02/2017 1  % Final  . Basophils Absolute 08/02/2017 0.0  0 - 0.1 K/uL Final  . Sodium 08/02/2017 139  135 - 145 mmol/L Final  . Potassium 08/02/2017 4.1  3.5 - 5.1 mmol/L Final  . Chloride 08/02/2017 106  101 - 111 mmol/L Final  . CO2 08/02/2017 26  22 - 32 mmol/L Final  . Glucose, Bld 08/02/2017 116* 65 - 99 mg/dL Final  . BUN 08/02/2017 22* 6 - 20 mg/dL Final  . Creatinine, Ser 08/02/2017 1.37* 0.61 - 1.24 mg/dL Final  . Calcium 08/02/2017 9.2  8.9 - 10.3 mg/dL Final  . Total Protein 08/02/2017 6.2* 6.5 - 8.1 g/dL Final  . Albumin 08/02/2017 3.9  3.5 - 5.0 g/dL Final  . AST 08/02/2017 24  15 - 41 U/L Final  . ALT 08/02/2017 16* 17 - 63 U/L Final  . Alkaline Phosphatase 08/02/2017 49  38 - 126 U/L Final  . Total Bilirubin 08/02/2017 0.6  0.3 - 1.2 mg/dL Final  . GFR calc non Af Amer 08/02/2017 50* >60 mL/min Final  . GFR calc Af Amer 08/02/2017 58* >60 mL/min Final   Comment: (NOTE) The eGFR has been calculated using the CKD EPI  equation. This calculation has not been validated in all clinical situations. eGFR's persistently <60 mL/min signify possible Chronic Kidney Disease.   . Anion gap 08/02/2017 7  5 - 15 Final  . Magnesium 08/02/2017 1.9  1.7 - 2.4 mg/dL Final    Assessment:  Liberato Stansbery is a 71 y.o. male with stage III mutiple myeloma.  He initially presented with progressive back pain beginning in 12/2006.  MRI revealed "spots and compression fractures".  He began Velcade, thalidomide, and Decadron.  In 08/2007, he underwent high dose chemotherapy and autologous stem cell transplant.  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  He recurred with a rising M-spike (2.7) with repeat M spike (1.7 gm/dl) in 03/2010.  He was initially treated with Velcade (02/08/2010 - 05/10/2010).  He then began Revlimid (15 mg 3 weeks on/1 week off) and Decadron (40 mg on day 1, 8, 15, 22).  Because of significant side effect with Decadron his dose was decreased to 10 mg once a week in 07/2010.    He was on maintenance Revlimid. Revlimid was initially10 mg 3 weeks on/1 week off. This was changed to 10 mg 2 weeks on/2 weeks off secondary to right nipple tenderness. His dose  was increased to 10 mg 3 weeks on/1 week off with Decadron 10 mg a week (on Sundays) and then Revlamid 15 mg 3 weeks on and 1 week off with Decadron on Sundays.  He began Pomalyst 4 mg 3 weeks on/1 week off with Decadron on 08/27/2015.  Over the past year his SPEP has revealed no monoclonal protein (04/21/2015) and 0.5 gm/dL on 09/22/2015 and 10/20/2015. M spike was 0.1 on 02/02/2016, 03/01/2016, 03/29/2016, 04/26/2016, and 05/24/2016.  M spike was 0 on 10/11/2016, 11/28/2016, 02/05/2017, and 03/21/2017.  Free light chains have been monitored. Kappa free light chains were 18.54 on 11/21/2013, 18.37 on 02/20/2014, 18.93 on 05/22/2014, 32.58 (high; normal ratio 1.27) on 08/21/2014, 51.53 (high; elevated ratio of 2.12) on 11/13/2014,  28.08 (ratio 1.73) on 12/09/2014, 23.71 (ratio 2.17) on 01/18/2015, 92.93 (ratio 9.49) on 04/21/2015, 93.44 (ratio 10.28) on 05/26/2015, 255.45 (ratio of 24.05) on 07/14/2015, 373.89 (ratio 48.31) on 08/04/2015, 474.33 (ratio 70.58) on 08/25/2015, 450.76 (ratio 55.44) on 09/22/2015, 453.4 (ratio 59.89) on 10/20/2015, 58.26 (ratio of > 40.74) on 02/02/2016, 62.67 (ratio of 37.98) on 03/01/2016, 75.7 (ratio > 50.47) on 03/29/2016, 79.7 (ratio 53.13) on 04/26/2016, 108.6 (ratio > 72.4) on 05/24/2016, 8.3 (1.46 ratio) on 10/11/2016, 6.7 (0.81 ratio) on 02/05/2017, and 7.8 (1.01 ratio) on 03/21/2017.  Bone survey on 12/08/2014 was stable.  Bone survey on 10/21/2015 revealed increase conspicuity of subcentimeter lytic lesions in the calvarium.  Bone marrow aspirate and biopsy on 11/04/2015 revealed an atypical monoclonal plasma cells estimated at 30-40% of marrow cells.   Marrow was variably cellular (approximately 45%) with background trilineage hematopoiesis. There was no significant increase in marrow reticulin fibers. Storage iron was present.    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 He develoed herpes zoster in 04/2008. He developed a pulmonary embolism in 05/2013. He was initially on Xarelto, but is now on Eliquis. He had an episode of pneumonia around this time requiring a brief admission. He developed severe lower leg cramps on 08/07/2014 secondary to hypokalemia. Duplex was negative.   He was treated for C difficile colitis (Flagyl completed 07/30/2015).  He has a chronic indwelling pain pump.  He received 4 cycles of Pomalyst and Decadron (08/27/2015 - 11/19/2015).  Restaging studies document progressive disease.  Kappa free light chains are increasing.  SPEP revealed 0.5 gm/dL monoclonal protein then 1.3 gm/dL.  Bone survey reveals increase conspicuity of subcentimeter lytic lesions in the calvarium.  Bone marrow reveals 30-40% plasma  cells.   MUGA on 11/30/2015 revealed an ejection fraction of 46%.  He is felt not to be a good candidate for Kyprolis.  There were no focal wall motion abnormalities.  He had a stress echo less than 1 year ago.  He has a history of PVCs and atrial fibrillation.  He takes Cardizem prn.  He received 17 weeks of daratumumab (Darzalex) (12/09/2015 - 05/25/2016).  He tolerated treatment well without side effect.  Bone marrow on 02/09/2016 revealed no diagnostic morphologic evidence of plasma cell myeloma.  Marrow was normocellular to hypocellular marrow for age (ranging from 10-40%) with maturing trilineage hematopoiesis and mild multilineage dyspoiesis.  There was patchy mild increase in reticulin.  Storage iron was present.  Flow cytometry revealed no definitive evidence of monoclonality.  There was a non-specific atypical myeloid and monocytic findings with no increase in blasts.  Cytogenetics were normal (46, XY).  He is currently day 382 s/p 2nd autologous stem cell transplant on 06/16/2016.  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  He also experienced atrial fibrillation with intermittent episodes of RVR requiring IV beta blockers.  He is on prophylactic valacyclovir for 1 year post transplant.  He started vaccinations (DTaP-Pediatric triple vaccine, Hep B- Pediatrix triple vaccine, Haemophilus influenza B (Hib), inactivated polio virus (IPV), pneumococcal conjugate vaccine 13-valent (PCV 13)) on 04/17/2017.  Recommendation was for Shingrix vaccine to be given in [place] (not done) and second dose of Shingrix in 2 months.  He has follow-up vaccinations in 3 months (07/18/2017).    He is s/p 7 cycles of daratumumab (Darzalex) post transplant (12/05/2016 - 07/02/2017).  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  Work-up on 08/23/2016 revealed the following normal studies:  B12 and folate.  Ferritin was 1379 (high).  Iron  studies included a saturation of 56% and TIBC 141 (low).  ESR was 64.  Thrombocytopenia has resolved.  He has a history of persistent hypomagnesemia and receives IV magnesium (2-4 gm) weekly.  He has not required magnesium since 10/24/2016.  Symptomatically, he feels "great".  He is gaining weight. He continues to have loose stools that is controlled with Imodium 2/day. Exam is unremarkable. WBC 7300 with an ANC of 4100. Hemoglobin 12.4, hematocrit 35.7, platelets 204,000.  Plan:  1.  Labs today:  CBC with diff, CMP, Mg, SPEP. 2.  Patient to take own premeds from home:  Ondansetron (8 mg), Tylenol (650 mg), and Benadryl (50 mg). 3.  Cycle #8 daratumumab today. 4.  RTC on 09/06/2017 for MD assessment, labs (CBC with diff, CMP, Mg, SPEP), and cycle #9 daratumumab.   Honor Loh, FNP-C 08/02/2017, 9:08 AM   I saw and evaluated the patient, participating in the key portions of the service and reviewing pertinent diagnostic studies and records.  I reviewed the nurse practitioner's note and agree with the findings and the plan.  The assessment and plan were discussed with the patient.  Multiple questions were asked by the patient and answered.   Lequita Asal, MD  08/02/2017 9:08 AM

## 2017-08-02 ENCOUNTER — Inpatient Hospital Stay: Payer: Medicare Other | Attending: Hematology and Oncology

## 2017-08-02 ENCOUNTER — Encounter: Payer: Self-pay | Admitting: Hematology and Oncology

## 2017-08-02 ENCOUNTER — Other Ambulatory Visit: Payer: Self-pay | Admitting: Hematology and Oncology

## 2017-08-02 ENCOUNTER — Inpatient Hospital Stay: Payer: Medicare Other

## 2017-08-02 ENCOUNTER — Inpatient Hospital Stay (HOSPITAL_BASED_OUTPATIENT_CLINIC_OR_DEPARTMENT_OTHER): Payer: Medicare Other | Admitting: Hematology and Oncology

## 2017-08-02 ENCOUNTER — Other Ambulatory Visit: Payer: Self-pay | Admitting: *Deleted

## 2017-08-02 VITALS — BP 134/84 | HR 72 | Resp 20

## 2017-08-02 VITALS — BP 127/84 | HR 90 | Temp 95.9°F | Resp 18 | Wt 163.0 lb

## 2017-08-02 DIAGNOSIS — M549 Dorsalgia, unspecified: Secondary | ICD-10-CM | POA: Diagnosis not present

## 2017-08-02 DIAGNOSIS — C4491 Basal cell carcinoma of skin, unspecified: Secondary | ICD-10-CM | POA: Insufficient documentation

## 2017-08-02 DIAGNOSIS — Z9484 Stem cells transplant status: Secondary | ICD-10-CM | POA: Insufficient documentation

## 2017-08-02 DIAGNOSIS — I4891 Unspecified atrial fibrillation: Secondary | ICD-10-CM | POA: Insufficient documentation

## 2017-08-02 DIAGNOSIS — Z87891 Personal history of nicotine dependence: Secondary | ICD-10-CM

## 2017-08-02 DIAGNOSIS — Z7984 Long term (current) use of oral hypoglycemic drugs: Secondary | ICD-10-CM

## 2017-08-02 DIAGNOSIS — R1011 Right upper quadrant pain: Secondary | ICD-10-CM | POA: Insufficient documentation

## 2017-08-02 DIAGNOSIS — F419 Anxiety disorder, unspecified: Secondary | ICD-10-CM | POA: Diagnosis not present

## 2017-08-02 DIAGNOSIS — R197 Diarrhea, unspecified: Secondary | ICD-10-CM | POA: Diagnosis not present

## 2017-08-02 DIAGNOSIS — Z978 Presence of other specified devices: Secondary | ICD-10-CM | POA: Diagnosis not present

## 2017-08-02 DIAGNOSIS — C9000 Multiple myeloma not having achieved remission: Secondary | ICD-10-CM

## 2017-08-02 DIAGNOSIS — Z8673 Personal history of transient ischemic attack (TIA), and cerebral infarction without residual deficits: Secondary | ICD-10-CM

## 2017-08-02 DIAGNOSIS — N400 Enlarged prostate without lower urinary tract symptoms: Secondary | ICD-10-CM

## 2017-08-02 DIAGNOSIS — G8929 Other chronic pain: Secondary | ICD-10-CM | POA: Insufficient documentation

## 2017-08-02 DIAGNOSIS — K219 Gastro-esophageal reflux disease without esophagitis: Secondary | ICD-10-CM | POA: Insufficient documentation

## 2017-08-02 DIAGNOSIS — Z86711 Personal history of pulmonary embolism: Secondary | ICD-10-CM | POA: Diagnosis not present

## 2017-08-02 DIAGNOSIS — Z7901 Long term (current) use of anticoagulants: Secondary | ICD-10-CM | POA: Insufficient documentation

## 2017-08-02 DIAGNOSIS — Z79899 Other long term (current) drug therapy: Secondary | ICD-10-CM

## 2017-08-02 DIAGNOSIS — G629 Polyneuropathy, unspecified: Secondary | ICD-10-CM | POA: Insufficient documentation

## 2017-08-02 DIAGNOSIS — C9002 Multiple myeloma in relapse: Secondary | ICD-10-CM

## 2017-08-02 DIAGNOSIS — Z5112 Encounter for antineoplastic immunotherapy: Secondary | ICD-10-CM | POA: Insufficient documentation

## 2017-08-02 LAB — CBC WITH DIFFERENTIAL/PLATELET
Basophils Absolute: 0 10*3/uL (ref 0–0.1)
Basophils Relative: 1 %
Eosinophils Absolute: 0.1 10*3/uL (ref 0–0.7)
Eosinophils Relative: 1 %
HCT: 35.7 % — ABNORMAL LOW (ref 40.0–52.0)
Hemoglobin: 12.4 g/dL — ABNORMAL LOW (ref 13.0–18.0)
Lymphocytes Relative: 30 %
Lymphs Abs: 2.2 10*3/uL (ref 1.0–3.6)
MCH: 34.9 pg — ABNORMAL HIGH (ref 26.0–34.0)
MCHC: 34.6 g/dL (ref 32.0–36.0)
MCV: 101 fL — ABNORMAL HIGH (ref 80.0–100.0)
Monocytes Absolute: 0.8 10*3/uL (ref 0.2–1.0)
Monocytes Relative: 11 %
Neutro Abs: 4.1 10*3/uL (ref 1.4–6.5)
Neutrophils Relative %: 57 %
Platelets: 204 10*3/uL (ref 150–440)
RBC: 3.54 MIL/uL — ABNORMAL LOW (ref 4.40–5.90)
RDW: 15 % — ABNORMAL HIGH (ref 11.5–14.5)
WBC: 7.3 10*3/uL (ref 3.8–10.6)

## 2017-08-02 LAB — COMPREHENSIVE METABOLIC PANEL
ALT: 16 U/L — ABNORMAL LOW (ref 17–63)
AST: 24 U/L (ref 15–41)
Albumin: 3.9 g/dL (ref 3.5–5.0)
Alkaline Phosphatase: 49 U/L (ref 38–126)
Anion gap: 7 (ref 5–15)
BUN: 22 mg/dL — ABNORMAL HIGH (ref 6–20)
CO2: 26 mmol/L (ref 22–32)
Calcium: 9.2 mg/dL (ref 8.9–10.3)
Chloride: 106 mmol/L (ref 101–111)
Creatinine, Ser: 1.37 mg/dL — ABNORMAL HIGH (ref 0.61–1.24)
GFR calc Af Amer: 58 mL/min — ABNORMAL LOW (ref >60)
GFR calc non Af Amer: 50 mL/min — ABNORMAL LOW (ref >60)
Glucose, Bld: 116 mg/dL — ABNORMAL HIGH (ref 65–99)
Potassium: 4.1 mmol/L (ref 3.5–5.1)
Sodium: 139 mmol/L (ref 135–145)
Total Bilirubin: 0.6 mg/dL (ref 0.3–1.2)
Total Protein: 6.2 g/dL — ABNORMAL LOW (ref 6.5–8.1)

## 2017-08-02 LAB — MAGNESIUM: Magnesium: 1.9 mg/dL (ref 1.7–2.4)

## 2017-08-02 MED ORDER — DIPHENHYDRAMINE HCL 25 MG PO CAPS: 50.0000 mg | ORAL_CAPSULE | Freq: Once | ORAL | Status: DC

## 2017-08-02 MED ORDER — ONDANSETRON 8 MG PO TBDP: 8.0000 mg | ORAL_TABLET | Freq: Once | ORAL | Status: DC

## 2017-08-02 MED ORDER — ACETAMINOPHEN 325 MG PO TABS: 650.0000 mg | ORAL_TABLET | Freq: Once | ORAL | Status: DC

## 2017-08-02 MED ORDER — HEPARIN SOD (PORK) LOCK FLUSH 100 UNIT/ML IV SOLN
500.0000 [IU] | Freq: Once | INTRAVENOUS | Status: AC | PRN
  Administered 2017-08-02: 500 [IU]

## 2017-08-02 MED ORDER — HEPARIN SOD (PORK) LOCK FLUSH 100 UNIT/ML IV SOLN
500.0000 [IU] | Freq: Once | INTRAVENOUS | Status: AC
  Filled 2017-08-02: qty 5

## 2017-08-02 MED ORDER — SODIUM CHLORIDE 0.9 % IV SOLN
Freq: Once | INTRAVENOUS | Status: AC
  Administered 2017-08-02: 10:00:00 via INTRAVENOUS
  Filled 2017-08-02: qty 1000

## 2017-08-02 MED ORDER — SODIUM CHLORIDE 0.9 % IV SOLN
1200.0000 mg | Freq: Once | INTRAVENOUS | Status: AC
  Administered 2017-08-02: 1200 mg via INTRAVENOUS
  Filled 2017-08-02: qty 60

## 2017-08-02 MED ORDER — SODIUM CHLORIDE 0.9 % IV SOLN: 1200.0000 mg | Freq: Once | INTRAVENOUS | Status: DC

## 2017-08-02 MED ORDER — SODIUM CHLORIDE 0.9% FLUSH
10.0000 mL | Freq: Once | INTRAVENOUS | Status: AC
  Administered 2017-08-02: 10 mL via INTRAVENOUS
  Filled 2017-08-02: qty 10

## 2017-08-02 NOTE — Progress Notes (Signed)
Patient offers no complaints today. 

## 2017-08-03 LAB — PROTEIN ELECTROPHORESIS, SERUM
A/G Ratio: 1.5 (ref 0.7–1.7)
Albumin ELP: 3.5 g/dL (ref 2.9–4.4)
Alpha-1-Globulin: 0.1 g/dL (ref 0.0–0.4)
Alpha-2-Globulin: 0.8 g/dL (ref 0.4–1.0)
Beta Globulin: 1 g/dL (ref 0.7–1.3)
Gamma Globulin: 0.3 g/dL — ABNORMAL LOW (ref 0.4–1.8)
Globulin, Total: 2.3 g/dL (ref 2.2–3.9)
Total Protein ELP: 5.8 g/dL — ABNORMAL LOW (ref 6.0–8.5)

## 2017-08-09 ENCOUNTER — Other Ambulatory Visit: Payer: Self-pay | Admitting: *Deleted

## 2017-08-09 DIAGNOSIS — C9001 Multiple myeloma in remission: Secondary | ICD-10-CM

## 2017-08-09 MED ORDER — POTASSIUM CHLORIDE CRYS ER 20 MEQ PO TBCR: EXTENDED_RELEASE_TABLET | ORAL | 0 refills | Status: DC

## 2017-08-09 NOTE — Telephone Encounter (Signed)
Dx:  Multiple myeloma not having achieved ...   Ref Range & Units 7d ago  Potassium 3.5 - 5.1 mmol/L 4.1

## 2017-08-14 ENCOUNTER — Other Ambulatory Visit: Payer: Self-pay | Admitting: Nurse Practitioner

## 2017-08-14 DIAGNOSIS — M62838 Other muscle spasm: Secondary | ICD-10-CM

## 2017-08-15 ENCOUNTER — Other Ambulatory Visit: Payer: Self-pay | Admitting: Hematology and Oncology

## 2017-08-27 ENCOUNTER — Encounter
Admission: RE | Admit: 2017-08-27 | Discharge: 2017-08-27 | Disposition: A | Discharge status: Home / Self Care | Payer: Medicare Other | Source: Ambulatory Visit | Attending: Pain Medicine | Admitting: Pain Medicine

## 2017-08-27 ENCOUNTER — Ambulatory Visit (HOSPITAL_BASED_OUTPATIENT_CLINIC_OR_DEPARTMENT_OTHER): Payer: Medicare Other | Admitting: Nurse Practitioner

## 2017-08-27 ENCOUNTER — Encounter: Payer: Self-pay | Admitting: Nurse Practitioner

## 2017-08-27 ENCOUNTER — Other Ambulatory Visit: Payer: Self-pay

## 2017-08-27 VITALS — BP 147/82 | HR 85 | Temp 97.7°F | Resp 16 | Ht 67.0 in | Wt 165.0 lb

## 2017-08-27 DIAGNOSIS — Z5181 Encounter for therapeutic drug level monitoring: Secondary | ICD-10-CM | POA: Diagnosis not present

## 2017-08-27 DIAGNOSIS — Z9689 Presence of other specified functional implants: Secondary | ICD-10-CM | POA: Diagnosis not present

## 2017-08-27 DIAGNOSIS — M5416 Radiculopathy, lumbar region: Secondary | ICD-10-CM

## 2017-08-27 DIAGNOSIS — G8929 Other chronic pain: Secondary | ICD-10-CM | POA: Diagnosis not present

## 2017-08-27 DIAGNOSIS — Z79891 Long term (current) use of opiate analgesic: Secondary | ICD-10-CM | POA: Insufficient documentation

## 2017-08-27 DIAGNOSIS — I10 Essential (primary) hypertension: Secondary | ICD-10-CM | POA: Insufficient documentation

## 2017-08-27 DIAGNOSIS — Z87891 Personal history of nicotine dependence: Secondary | ICD-10-CM | POA: Diagnosis not present

## 2017-08-27 DIAGNOSIS — G894 Chronic pain syndrome: Secondary | ICD-10-CM

## 2017-08-27 DIAGNOSIS — E785 Hyperlipidemia, unspecified: Secondary | ICD-10-CM | POA: Insufficient documentation

## 2017-08-27 DIAGNOSIS — Z7901 Long term (current) use of anticoagulants: Secondary | ICD-10-CM | POA: Diagnosis not present

## 2017-08-27 DIAGNOSIS — Z79899 Other long term (current) drug therapy: Secondary | ICD-10-CM | POA: Insufficient documentation

## 2017-08-27 DIAGNOSIS — Z86711 Personal history of pulmonary embolism: Secondary | ICD-10-CM | POA: Diagnosis not present

## 2017-08-27 DIAGNOSIS — M549 Dorsalgia, unspecified: Secondary | ICD-10-CM | POA: Insufficient documentation

## 2017-08-27 DIAGNOSIS — M545 Low back pain: Secondary | ICD-10-CM | POA: Diagnosis not present

## 2017-08-27 HISTORY — DX: Personal history of urinary calculi: Z87.442

## 2017-08-27 LAB — SURGICAL PCR SCREEN
MRSA, PCR: NEGATIVE
Staphylococcus aureus: NEGATIVE

## 2017-08-27 MED ORDER — PAIN MANAGEMENT IT PUMP REFILL: 1.0000 | Freq: Once | INTRATHECAL | 0 refills | Status: DC

## 2017-08-27 NOTE — Addendum Note (Signed)
Addended by: Dewayne Shorter on: 08/27/2017 01:59 PM   Modules accepted: Orders

## 2017-08-27 NOTE — Pre-Procedure Instructions (Signed)
2018 June ECG 12-lead 03/12/2017 [place] Component Name Value Ref Range  Vent Rate (bpm) 71   PR Interval (msec) 194   QRS Interval (msec) 86   QT Interval (msec) 396   QTc (msec) 430   Result Narrative  Normal sinus rhythm Normal ECG When compared with ECG of 25-Nov-2015 11:44, aberrant conduction is no longer present I reviewed and concur with this report. Electronically signed [id number] MD, Darnell Level [phone]) on 03/22/2017 12:00:29 PM  Status Results Details

## 2017-08-27 NOTE — Patient Instructions (Signed)
Your procedure is scheduled on: Tuesday, September 04, 2017 Report to Same Day Surgery on the 2nd floor in the [facility]. To find out your arrival time, please call [phone] between 1PM - 3PM on: Monday, September 03, 2017   REMEMBER: Instructions that are not followed completely may result in serious medical risk, up to and including death; or upon the discretion of your surgeon and anesthesiologist your surgery may need to be rescheduled.  Do not eat food after midnight the night before your procedure.  No gum chewing or hard candies.  You may however, drink CLEAR liquids up to 2 hours before you are scheduled to arrive at the hospital for your procedure.  Do not drink clear liquids within 2 hours of the start of your surgery.  Clear liquids include: - water  - apple juice without pulp - clear gatorade - black coffee or tea (Do NOT add anything to the coffee or tea) Do NOT drink anything that is not on this list.  No Alcohol for 24 hours before or after surgery.  No Smoking including e-cigarettes for 24 hours prior to surgery. No chewable tobacco products for at least 6 hours prior to surgery. No nicotine patches on the day of surgery.  Notify your doctor if there is any change in your medical condition (cold, fever, infection).  Do not wear jewelry, make-up, hairpins, clips or nail polish.  Do not wear lotions, powders, or perfumes. You may wear deodorant.  Do not shave 48 hours prior to surgery. Men may shave face and neck.  Contacts and dentures may not be worn into surgery.  Do not bring valuables to the hospital. [facility] is not responsible for any belongings or valuables.   TAKE THESE MEDICATIONS THE MORNING OF SURGERY WITH A SIP OF WATER:  1.  DILTIAZEM 2.  OMEPRAZOLE 3.  TAMSULOSIN  Use CHG Soap as directed on instruction sheet.  Follow recommendations from Cardiologist, Pulmonologist or PCP regarding stopping Eliquis.  NOW!  Stop Anti-inflammatories  such as Advil, Aleve, Ibuprofen, Motrin, Naproxen, Naprosyn, Goodie powder, or aspirin products. (May take Tylenol or Acetaminophen if needed.)  NOW!  Stop ANY OVER THE COUNTER supplements until after surgery. (May continue Vitamin D, Vitamin B, and multivitamin.)  If you are being discharged the day of surgery, you will not be allowed to drive home. You will need someone to drive you home and stay with you that night.   If you are taking public transportation, you will need to have a responsible adult to with you.  Please call the number above if you have any questions about these instructions.

## 2017-08-27 NOTE — Progress Notes (Signed)
Safety precautions to be maintained throughout the outpatient stay will include: orient to surroundings, keep bed in low position, maintain call bell within reach at all times, provide assistance with transfer out of bed and ambulation.  

## 2017-08-27 NOTE — Patient Instructions (Signed)

## 2017-08-27 NOTE — Progress Notes (Signed)
Patient's Name: Logan Perez  MRN: [id number]  Referring Provider: Sofie Hartigan, MD  DOB: 1946/06/22  PCP: Sofie Hartigan, MD  DOS: 08/27/2017  Note by: Vevelyn Francois NP  Service setting: Ambulatory outpatient  Specialty: Interventional Pain Management  Location: [facility] (AMB) Pain Management Facility    Patient type: Established    Primary Reason(s) for Visit: Evaluation of chronic illnesses with exacerbation, or progression (Level of risk: moderate) CC: Back Pain (low)  HPI  Logan Perez is a 71 y.o. year old, male patient, who comes today for a follow-up evaluation. He has BPH with obstruction/lower urinary tract symptoms; Multiple myeloma ([place]); Diarrhea; Clostridium difficile diarrhea; Essential (primary) hypertension; Acid reflux; Combined fat and carbohydrate induced hyperlipemia; MI (mitral incompetence); TI (tricuspid incompetence); Paroxysmal atrial fibrillation ([place]); Pulmonary embolism ([place]); Breathlessness on exertion; Presence of intrathecal pump; Long term current use of opiate analgesic; Long term prescription opiate use; Opiate use; Encounter for therapeutic drug level monitoring; Night muscle spasms; Muscle spasticity; Neuropathic pain; Neurogenic pain; Chronic low back pain; Lumbar spondylosis; Compression fracture of T12 vertebra (HCC) (70-75% magnitude) (with mild retropulsion); Diffuse myofascial pain syndrome; Presence of implanted infusion pump ([employer], programmable, intrathecal pump); Cancer associated pain; Encounter for interrogation of infusion pump; Chronic lumbar radicular pain; Opiate analgesic contract exists; Chronic pain syndrome; Chronic anticoagulation (Eliquis); Hypomagnesemia; Ejection fraction < 50%; Encounter for adjustment or management of infusion pump; Porokeratosis; Moderate protein-calorie malnutrition ([place]); Thrombocytopenia ([place]); Anemia; Nausea without vomiting; Xerostomia; Atrial fibrillation ([place]); 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 Logan Perez was last seen on 08/14/2017. His primarily concern today is the Back Pain (low)  Pain Assessment: Location: Lower   Radiating:   Onset: More than a month ago Duration: Chronic pain Quality: Aching Severity: 2 /10 (self-reported pain score)  Note: Reported level is compatible with observation.                          Effect on ADL:   Timing: Constant Modifying factors:    Further details on both, my assessment(s), as well as the proposed treatment plan, please see below. Patient is in today for preop evaluation for pump battery change. He admits that he has had for approximately 6 years. He will be having the battery changed on Tuesday. He is currently on Eliquis. He will stop the medication 3 days prior to procedure. He is aware not to use any NSAIDs. He will use Tylenol for any additional pain management. He questions were asked and answered.  Laboratory Chemistry  Inflammation Markers (CRP: Acute Phase) (ESR: Chronic Phase) Lab Results  Component Value Date   ESRSEDRATE 64 (H) 08/23/2016                 Renal Function Markers Lab Results  Component Value Date   BUN 22 (H) 08/02/2017   CREATININE 1.37 (H) 08/02/2017   GFRAA 58 (L) 08/02/2017   GFRNONAA 50 (L) 08/02/2017                 Hepatic Function Markers Lab Results  Component Value Date   AST 24 08/02/2017   ALT 16 (L) 08/02/2017   ALBUMIN 3.9 08/02/2017  ALKPHOS 49 08/02/2017                 Electrolytes Lab Results  Component Value Date   NA 139 08/02/2017   K 4.1 08/02/2017   CL 106 08/02/2017    CALCIUM 9.2 08/02/2017   MG 1.9 08/02/2017                 Neuropathy Markers Lab Results  Component Value Date   VITAMINB12 746 08/30/2016                 Bone Pathology Markers Lab Results  Component Value Date   ALKPHOS 49 08/02/2017   CALCIUM 9.2 08/02/2017                 Rheumatology Markers No results found for: LABURIC, URICUR              Coagulation Parameters Lab Results  Component Value Date   INR 0.97 02/09/2016   LABPROT 13.1 02/09/2016   APTT 25 02/09/2016   PLT 204 08/02/2017                 Cardiovascular Markers Lab Results  Component Value Date   TROPONINI < 0.02 05/26/2013   HGB 12.4 (L) 08/02/2017   HCT 35.7 (L) 08/02/2017                 CA Markers No results found for: CEA, CA125, LABCA2               Note: Lab results reviewed.  Recent Diagnostic Imaging Review   Spine Imaging:  CT-Guided Biopsy:  Results for orders placed during the hospital encounter of 02/09/16  CT Biopsy   Narrative INDICATION: 71 year old male with a history of relapsed multiple myeloma.  EXAM: CT BIOPSY  MEDICATIONS: None.  ANESTHESIA/SEDATION: Moderate (conscious) sedation was employed during this procedure. A total of Versed 3.0 mg and Fentanyl 100 mcg was administered intravenously.  Moderate Sedation Time: 17 minutes. The patient's level of consciousness and vital signs were monitored continuously by radiology nursing throughout the procedure under my direct supervision.  FLUOROSCOPY TIME:  CT  COMPLICATIONS: None  PROCEDURE: The procedure risks, benefits, and alternatives were explained to the patient. Questions regarding the procedure were encouraged and answered. The patient understands and consents to the procedure.  Scout CT of the pelvis was performed for surgical planning purposes.  The posterior pelvis was prepped with Betadinein a sterile fashion, and a sterile drape was applied covering the operative field. A sterile gown and  sterile gloves were used for the procedure. Local anesthesia was provided with 1% Lidocaine.  We targeted the right posterior iliac bone for biopsy. The skin and subcutaneous tissues were infiltrated with 1% lidocaine without epinephrine. A small stab incision was made with an 11 blade scalpel, and an 11 gauge Murphy needle was advanced with CT guidance to the posterior cortex. Manual forced was used to advance the needle through the posterior cortex and the stylet was removed. A bone marrow aspirate was retrieved and passed to a cytotechnologist in the room. The Murphy needle was then advanced without the stylet for a core biopsy. The core biopsy was retrieved and also passed to a cytotechnologist.  Manual pressure was used for hemostasis and a sterile dressing was placed.  No complications were encountered no significant blood loss was encountered.  Patient tolerated the procedure well and remained hemodynamically stable throughout.  IMPRESSION: Status post CT-guided bone marrow biopsy, with tissue specimen  sent to pathology for complete histopathologic analysis  Signed,  Dulcy Fanny. Earleen Newport, DO  [facility]  [facility]   Electronically Signed   By: Corrie Mckusick D.O.   On: 02/09/2016 14:30     Complexity Note: Imaging results reviewed. Results shared with Mr. Schewe, using Layman's terms.                         Meds   Current Outpatient Medications:  .  acetaminophen (TYLENOL) 325 MG tablet, Take 650 mg every 6 (six) hours as needed by mouth for mild pain or headache. , Disp: , Rfl:  .  ALPRAZolam (XANAX) 0.25 MG tablet, TAKE 1 TABLET BY MOUTH EVERY NIGHT AT BEDTIME, AS NEEDED FOR ANXIETY (Patient taking differently: Take 0.25 mg at bedtime as needed by mouth for anxiety. ), Disp: 30 tablet, Rfl: 1 .  baclofen (LIORESAL) 10 MG tablet, Take 1 tablet (10 mg total) by mouth at bedtime., Disp: 30 tablet, Rfl: 5 .  Calcium  Carb-Cholecalciferol (CALCIUM-VITAMIN D) 500-400 MG-UNIT TABS, Take 1 tablet daily by mouth., Disp: , Rfl:  .  cetirizine (ZYRTEC) 10 MG tablet, Take 10 mg by mouth daily as needed for allergies. , Disp: , Rfl:  .  Cholecalciferol (VITAMIN D3) 2000 units capsule, Take 2,000 Units daily by mouth. , Disp: , Rfl:  .  Daratumumab (DARZALEX IV), Inject 1,200 mg every 30 (thirty) days into the vein. , Disp: , Rfl:  .  dexamethasone (DECADRON) 4 MG tablet, Take 4 tabs PO 1 hr prior to infusion, then take 1 tab on day 2 and day 3 (Patient taking differently: Take 4-16 mg See admin instructions by mouth. Take 16 mg by mouth 1 hour prior to infusion, then take 4 mg by mouth on day 2 and 3), Disp: 30 tablet, Rfl: 1 .  diltiazem (CARDIZEM CD) 120 MG 24 hr capsule, Take 120 mg daily by mouth. , Disp: , Rfl:  .  diltiazem (CARDIZEM) 60 MG tablet, Take 60 mg daily as needed by mouth (for increased heart rate > 140). , Disp: , Rfl:  .  diphenhydrAMINE (BENADRYL) 25 MG tablet, Take 25 mg every 6 (six) hours as needed by mouth for itching or allergies. , Disp: , Rfl:  .  ELIQUIS 5 MG TABS tablet, TAKE 1 TABLET(5 MG) BY MOUTH TWICE DAILY, Disp: 60 tablet, Rfl: 0 .  ENSURE (ENSURE), Take 237 mLs by mouth daily., Disp: , Rfl:  .  fluticasone (FLONASE) 50 MCG/ACT nasal spray, Place 1 spray into the nose daily as needed for allergies. , Disp: , Rfl:  .  Hypromellose (ARTIFICIAL TEARS OP), Place 1 drop as needed into both eyes (for dry eyes)., Disp: , Rfl:  .  ibuprofen (ADVIL,MOTRIN) 200 MG tablet, Take 400 mg every 6 (six) hours as needed by mouth for headache or moderate pain., Disp: , Rfl:  .  loperamide (IMODIUM) 2 MG capsule, Take 4 mg as needed by mouth for diarrhea or loose stools. , Disp: , Rfl:  .  lovastatin (MEVACOR) 20 MG tablet, Take 20 mg by mouth every evening. , Disp: , Rfl:  .  montelukast (SINGULAIR) 10 MG tablet, TAKE 1 TABLET BY MOUTH ONCE A DAY AS DIRECTED. (Patient taking differently: Take 62ms daily  the day before the infusion, the day of, and the 2 days after infusion), Disp: 30 tablet, Rfl: 0 .  Multiple Vitamins-Iron (MULTIVITAMIN/IRON PO), Take 1 tablet daily by mouth., Disp: ,  Rfl:  .  naproxen sodium (ANAPROX) 220 MG tablet, Take 220 mg daily as needed by mouth (for pain or headaches). , Disp: , Rfl:  .  omeprazole (PRILOSEC) 20 MG capsule, Take 20 mg by mouth daily. , Disp: , Rfl:  .  ondansetron (ZOFRAN) 8 MG tablet, Take 8 mg every 8 (eight) hours as needed by mouth for nausea or vomiting., Disp: , Rfl:  .  potassium chloride SA (K-DUR,KLOR-CON) 20 MEQ tablet, TAKE 1 TABLET(20 MEQ) BY MOUTH DAILY, Disp: 90 tablet, Rfl: 0 .  ranitidine (ZANTAC) 150 MG tablet, Take 150 mg by mouth at bedtime. , Disp: , Rfl:  .  tamsulosin (FLOMAX) 0.4 MG CAPS capsule, Take 1 capsule (0.4 mg total) by mouth daily., Disp: 90 capsule, Rfl: 4 .  valACYclovir (VALTREX) 500 MG tablet, TAKE 1 TABLET BY MOUTH DAILY (Patient taking differently: TAKE 500 MG BY MOUTH DAILY), Disp: 30 tablet, Rfl: 0 .  vitamin B-12 (CYANOCOBALAMIN) 1000 MCG tablet, Take 1,000 mcg daily by mouth. , Disp: , Rfl:  .  PAIN MANAGEMENT IT PUMP REFILL, 1 each by Intrathecal route once. Medication: PF Fentanyl 1,500.0 mcg/ml PF Bupivicaine 30.0 mg/ml PF Clonidine 300.0 mcg/ml Total Volume: 40 ml Needed by 07-31-17 @ 1000, Disp: 1 each, Rfl: 0  ROS  Constitutional: Denies any fever or chills Gastrointestinal: No reported hemesis, hematochezia, vomiting, or acute GI distress Musculoskeletal: Denies any acute onset joint swelling, redness, loss of ROM, or weakness Neurological: No reported episodes of acute onset apraxia, aphasia, dysarthria, agnosia, amnesia, paralysis, loss of coordination, or loss of consciousness  Allergies  Mr. Azeez is allergic to azithromycin; bee pollen; pollen extract; zometa [zoledronic acid]; and rivaroxaban.  [place]  Drug: Mr. Bontempo  reports that he does not use drugs. Alcohol:  reports that he does not  drink alcohol. Tobacco:  reports that he quit smoking about 41 years ago. His smoking use included cigarettes. He has a 30.00 pack-year smoking history. he has never used smokeless tobacco. Medical:  has a past medical history of Anxiety, Atrial fibrillation ([place]), BPH (benign prostatic hyperplasia), Complication of anesthesia, Compression fracture of lumbar vertebra ([place]), Difficulty voiding, Dysrhythmia, Elevated PSA, GERD (gastroesophageal reflux disease), Hearing aid worn, History of kidney stones, HLD (hyperlipidemia), HOH (hard of hearing), Hypertension, Multiple myeloma ([place]), Neuropathy, Pain, Palpitations, Pneumonia, Pulmonary embolism ([place]), Sepsis ([place]), and Stroke ([place]). Surgical: Mr. Moehring  has a past surgical history that includes Back surgery (1994); Knee arthroscopy (Left, 1992); Pain pump implantation (2012); stem cell implant (2008); Cataract extraction w/PHACO (Right, 05/02/2016); Cataract extraction w/PHACO (Left, 05/16/2016); Limbal stem cell transplant; PORTA CATH INSERTION (N/A, 12/04/2016); Colonoscopy with propofol (N/A, 03/22/2017); Esophagogastroduodenoscopy (egd) with propofol (03/22/2017); and Eye surgery. Family: family history includes Cancer in his father; Kidney disease in his sister; Stroke in his mother and unknown relative.  Constitutional Exam  General appearance: Well nourished, well developed, and well hydrated. In no apparent acute distress Vitals:   08/27/17 1253  BP: (!) 147/82  Pulse: 85  Resp: 16  Temp: 97.7 F (36.5 C)  SpO2: 100%  Weight: 165 lb (74.8 kg)  Height: '5\' 7"'  (1.702 m)   BMI Assessment: Estimated body mass index is 25.84 kg/m as calculated from the following:   Height as of this encounter: '5\' 7"'  (1.702 m).   Weight as of this encounter: 165 lb (74.8 kg).  BMI interpretation table: BMI level Category Range association with higher incidence of chronic pain  <18 kg/m2 Underweight   18.5-24.9 kg/m2 Ideal body  weight   25-29.9 kg/m2  Overweight Increased incidence by 20%  30-34.9 kg/m2 Obese (Class I) Increased incidence by 68%  35-39.9 kg/m2 Severe obesity (Class II) Increased incidence by 136%  >40 kg/m2 Extreme obesity (Class III) Increased incidence by 254%   BMI Readings from Last 4 Encounters:  08/27/17 25.84 kg/m  08/27/17 25.84 kg/m  08/02/17 25.53 kg/m  07/31/17 25.37 kg/m   Wt Readings from Last 4 Encounters:  08/27/17 165 lb (74.8 kg)  08/27/17 165 lb (74.8 kg)  08/02/17 163 lb (73.9 kg)  07/31/17 162 lb (73.5 kg)  Psych/Mental status: Alert, oriented x 3 (person, place, & time)       Eyes: PERLA Respiratory: Lungs sounds clear to auscultation bilaterally Cardiovascular: S1 and S2 regular rate and rhythm tricuspid valve murmur audible Abdomen: Right lower quadrant abdominal intrathecal pump patent  Gait & Posture Assessment  Ambulation: Unassisted Gait: Relatively normal for age and body habitus Posture: WNL   Assessment  Primary Diagnosis & Pertinent Problem List: The primary encounter diagnosis was Chronic low back pain, unspecified back pain laterality, with sciatica presence unspecified. Diagnoses of Chronic lumbar radicular pain, Chronic anticoagulation (Eliquis), and Chronic pain syndrome were also pertinent to this visit.  Status Diagnosis  Controlled Controlled Controlled 1. Chronic low back pain, unspecified back pain laterality, with sciatica presence unspecified   2. Chronic lumbar radicular pain   3. Chronic anticoagulation (Eliquis)   4. Chronic pain syndrome     Problems updated and reviewed during this visit: No problems updated. Plan of Care  Pharmacotherapy (Medications Ordered): No orders of the defined types were placed in this encounter. This SmartLink is deprecated. Use AVSMEDLIST instead to display the medication list for a patient. Medications administered today: Lexine Baton "Rick" had no medications administered during this visit. Lab-work, procedure(s),  and/or referral(s): Orders Placed This Encounter  Procedures  . Protime-INR   Imaging and/or referral(s): None  Interventional Therapies: Scheduled:  Intrathecal pump refill    Provider-requested follow-up: No Follow-up on file.  Future Appointments  Date Time Provider [place]  09/06/2017  8:30 AM [facility] LAB [facility] None  09/06/2017  9:00 AM Lequita Asal, MD [facility] None  09/06/2017  9:30 AM [facility] INFUSION CHAIR 7 [facility] None  09/13/2017 12:30 PM Milinda Pointer, MD [facility] None  05/02/2018  8:30 AM McGowan, Hunt Oris, PA-C [facility] None   Primary Care Physician: Sofie Hartigan, MD Location: [facility] Note by: Vevelyn Francois NP Date: 08/27/2017; Time: 1:43 PM  Pain Score Disclaimer: We use the NRS-11 scale. This is a self-reported, subjective measurement of pain severity with only modest accuracy. It is used primarily to identify changes within a particular patient. It must be understood that outpatient pain scales are significantly less accurate that those used for research, where they can be applied under ideal controlled circumstances with minimal exposure to variables. In reality, the score is likely to be a combination of pain intensity and pain affect, where pain affect describes the degree of emotional arousal or changes in action readiness caused by the sensory experience of pain. Factors such as social and work situation, setting, emotional state, anxiety levels, expectation, and prior pain experience may influence pain perception and show large inter-individual differences that may also be affected by time variables.  Patient instructions provided during this appointment: Patient Instructions   ____________________________________________________________________________________________  Pain Scale  Introduction: The pain score used by this practice is the Verbal Numerical Rating Scale (VNRS-11). This  is an 11-point scale. It is for adults and children 10 years or older. There are significant differences in how the pain score is reported, used, and applied. Forget everything you learned in the past and learn this scoring system.  General Information: The scale should reflect your current level of pain. Unless you are specifically asked for the level of your worst pain, or your average pain. If you are asked for one of these two, then it should be understood that it is over the past 24 hours.  Basic Activities of Daily Living (ADL): Personal hygiene, dressing, eating, transferring, and using restroom.  Instructions: Most patients tend to report their level of pain as a combination of two factors, their physical pain and their psychosocial pain. This last one is also known as "suffering" and it is reflection of how physical pain affects you socially and psychologically. From now on, report them separately. From this point on, when asked to report your pain level, report only your physical pain. Use the following table for reference.  Pain Clinic Pain Levels (0-5/10)  Pain Level Score  Description  No Pain 0   Mild pain 1 Nagging, annoying, but does not interfere with basic activities of daily living (ADL). Patients are able to eat, bathe, get dressed, toileting (being able to get on and off the toilet and perform personal hygiene functions), transfer (move in and out of bed or a chair without assistance), and maintain continence (able to control bladder and bowel functions). Blood pressure and heart rate are unaffected. A normal heart rate for a healthy adult ranges from 60 to 100 bpm (beats per minute).   Mild to moderate pain 2 Noticeable and distracting. Impossible to hide from other people. More frequent flare-ups. Still possible to adapt and function close to normal. It can be very annoying and may have occasional stronger flare-ups. With discipline, patients may get used to it and adapt.    Moderate pain 3 Interferes significantly with activities of daily living (ADL). It becomes difficult to feed, bathe, get dressed, get on and off the toilet or to perform personal hygiene functions. Difficult to get in and out of bed or a chair without assistance. Very distracting. With effort, it can be ignored when deeply involved in activities.   Moderately severe pain 4 Impossible to ignore for more than a few minutes. With effort, patients may still be able to manage work or participate in some social activities. Very difficult to concentrate. Signs of autonomic nervous system discharge are evident: dilated pupils (mydriasis); mild sweating (diaphoresis); sleep interference. Heart rate becomes elevated (>115 bpm). Diastolic blood pressure (lower number) rises above 100 mmHg. Patients find relief in laying down and not moving.   Severe pain 5 Intense and extremely unpleasant. Associated with frowning face and frequent crying. Pain overwhelms the senses.  Ability to do any activity or maintain social relationships becomes significantly limited. Conversation becomes difficult. Pacing back and forth is common, as getting into a comfortable position is nearly impossible. Pain wakes you up from deep sleep. Physical signs will be obvious: pupillary dilation; increased sweating; goosebumps; brisk reflexes; cold, clammy hands and feet; nausea, vomiting or dry heaves; loss of appetite; significant sleep disturbance with inability to fall asleep or to remain asleep. When persistent, significant weight loss is observed due to the complete loss of appetite and sleep deprivation.  Blood pressure and heart rate becomes significantly elevated. Caution: If elevated blood pressure triggers a pounding headache associated with blurred vision, then  the patient should immediately seek attention at an urgent or emergency care unit, as these may be signs of an impending stroke.    Emergency Department Pain Levels (6-10/10)   Emergency Room Pain 6 Severely limiting. Requires emergency care and should not be seen or managed at an outpatient pain management facility. Communication becomes difficult and requires great effort. Assistance to reach the emergency department may be required. Facial flushing and profuse sweating along with potentially dangerous increases in heart rate and blood pressure will be evident.   Distressing pain 7 Self-care is very difficult. Assistance is required to transport, or use restroom. Assistance to reach the emergency department will be required. Tasks requiring coordination, such as bathing and getting dressed become very difficult.   Disabling pain 8 Self-care is no longer possible. At this level, pain is disabling. The individual is unable to do even the most "basic" activities such as walking, eating, bathing, dressing, transferring to a bed, or toileting. Fine motor skills are lost. It is difficult to think clearly.   Incapacitating pain 9 Pain becomes incapacitating. Thought processing is no longer possible. Difficult to remember your own name. Control of movement and coordination are lost.   The worst pain imaginable 10 At this level, most patients pass out from pain. When this level is reached, collapse of the autonomic nervous system occurs, leading to a sudden drop in blood pressure and heart rate. This in turn results in a temporary and dramatic drop in blood flow to the brain, leading to a loss of consciousness. Fainting is one of the body's self defense mechanisms. Passing out puts the brain in a calmed state and causes it to shut down for a while, in order to begin the healing process.    Summary: 1. Refer to this scale when providing [place] with your pain level. 2. Be accurate and careful when reporting your pain level. This will help with your care. 3. Over-reporting your pain level will lead to loss of credibility. 4. Even a level of 1/10 means that there is pain and will be  treated at our facility. 5. High, inaccurate reporting will be documented as "Symptom Exaggeration", leading to loss of credibility and suspicions of possible secondary gains such as obtaining more narcotics, or wanting to appear disabled, for fraudulent reasons. 6. Only pain levels of 5 or below will be seen at our facility. 7. Pain levels of 6 and above will be sent to the Emergency Department and the appointment cancelled. ____________________________________________________________________________________________

## 2017-08-28 ENCOUNTER — Inpatient Hospital Stay: Admission: RE | Admit: 2017-08-28 | Payer: Medicare Other | Source: Ambulatory Visit

## 2017-08-28 LAB — PROTIME-INR
INR: 1 (ref 0.8–1.2)
PROTHROMBIN TIME: 10.7 s (ref 9.1–12.0)

## 2017-09-03 ENCOUNTER — Telehealth: Payer: Self-pay

## 2017-09-03 ENCOUNTER — Other Ambulatory Visit: Payer: Self-pay | Admitting: Pain Medicine

## 2017-09-03 DIAGNOSIS — C9000 Multiple myeloma not having achieved remission: Secondary | ICD-10-CM

## 2017-09-03 DIAGNOSIS — Z95828 Presence of other vascular implants and grafts: Secondary | ICD-10-CM

## 2017-09-03 DIAGNOSIS — Z978 Presence of other specified devices: Secondary | ICD-10-CM

## 2017-09-03 DIAGNOSIS — M545 Low back pain, unspecified: Secondary | ICD-10-CM

## 2017-09-03 DIAGNOSIS — M5416 Radiculopathy, lumbar region: Secondary | ICD-10-CM

## 2017-09-03 DIAGNOSIS — Z462 Encounter for fitting and adjustment of other devices related to nervous system and special senses: Secondary | ICD-10-CM

## 2017-09-03 DIAGNOSIS — G8929 Other chronic pain: Secondary | ICD-10-CM

## 2017-09-03 DIAGNOSIS — G894 Chronic pain syndrome: Secondary | ICD-10-CM

## 2017-09-03 DIAGNOSIS — S22080A Wedge compression fracture of T11-T12 vertebra, initial encounter for closed fracture: Secondary | ICD-10-CM

## 2017-09-04 ENCOUNTER — Encounter
Admission: RE | Disposition: A | Discharge status: Home / Self Care | Payer: Self-pay | Source: Ambulatory Visit | Attending: Pain Medicine

## 2017-09-04 ENCOUNTER — Ambulatory Visit
Admission: RE | Admit: 2017-09-04 | Discharge: 2017-09-04 | Disposition: A | Discharge status: Home / Self Care | Payer: Medicare Other | Source: Ambulatory Visit | Attending: Pain Medicine | Admitting: Pain Medicine

## 2017-09-04 ENCOUNTER — Ambulatory Visit: Payer: Medicare Other | Admitting: Certified Registered Nurse Anesthetist

## 2017-09-04 ENCOUNTER — Other Ambulatory Visit: Payer: Self-pay

## 2017-09-04 DIAGNOSIS — Z86711 Personal history of pulmonary embolism: Secondary | ICD-10-CM | POA: Insufficient documentation

## 2017-09-04 DIAGNOSIS — I4891 Unspecified atrial fibrillation: Secondary | ICD-10-CM | POA: Insufficient documentation

## 2017-09-04 DIAGNOSIS — K219 Gastro-esophageal reflux disease without esophagitis: Secondary | ICD-10-CM | POA: Diagnosis not present

## 2017-09-04 DIAGNOSIS — Z7901 Long term (current) use of anticoagulants: Secondary | ICD-10-CM | POA: Insufficient documentation

## 2017-09-04 DIAGNOSIS — G8929 Other chronic pain: Secondary | ICD-10-CM | POA: Diagnosis present

## 2017-09-04 DIAGNOSIS — I10 Essential (primary) hypertension: Secondary | ICD-10-CM | POA: Insufficient documentation

## 2017-09-04 DIAGNOSIS — Z9484 Stem cells transplant status: Secondary | ICD-10-CM | POA: Insufficient documentation

## 2017-09-04 DIAGNOSIS — Z87891 Personal history of nicotine dependence: Secondary | ICD-10-CM | POA: Diagnosis not present

## 2017-09-04 DIAGNOSIS — E785 Hyperlipidemia, unspecified: Secondary | ICD-10-CM | POA: Diagnosis not present

## 2017-09-04 DIAGNOSIS — C9000 Multiple myeloma not having achieved remission: Secondary | ICD-10-CM | POA: Diagnosis present

## 2017-09-04 DIAGNOSIS — Z8579 Personal history of other malignant neoplasms of lymphoid, hematopoietic and related tissues: Secondary | ICD-10-CM | POA: Insufficient documentation

## 2017-09-04 DIAGNOSIS — G894 Chronic pain syndrome: Secondary | ICD-10-CM | POA: Diagnosis present

## 2017-09-04 DIAGNOSIS — F419 Anxiety disorder, unspecified: Secondary | ICD-10-CM | POA: Insufficient documentation

## 2017-09-04 DIAGNOSIS — Z462 Encounter for fitting and adjustment of other devices related to nervous system and special senses: Secondary | ICD-10-CM | POA: Diagnosis present

## 2017-09-04 DIAGNOSIS — N400 Enlarged prostate without lower urinary tract symptoms: Secondary | ICD-10-CM | POA: Diagnosis not present

## 2017-09-04 DIAGNOSIS — M545 Low back pain: Secondary | ICD-10-CM

## 2017-09-04 DIAGNOSIS — G629 Polyneuropathy, unspecified: Secondary | ICD-10-CM | POA: Diagnosis not present

## 2017-09-04 DIAGNOSIS — S22080A Wedge compression fracture of T11-T12 vertebra, initial encounter for closed fracture: Secondary | ICD-10-CM

## 2017-09-04 DIAGNOSIS — Z95828 Presence of other vascular implants and grafts: Secondary | ICD-10-CM

## 2017-09-04 DIAGNOSIS — Z8673 Personal history of transient ischemic attack (TIA), and cerebral infarction without residual deficits: Secondary | ICD-10-CM | POA: Diagnosis not present

## 2017-09-04 DIAGNOSIS — Z4549 Encounter for adjustment and management of other implanted nervous system device: Secondary | ICD-10-CM | POA: Insufficient documentation

## 2017-09-04 DIAGNOSIS — M5416 Radiculopathy, lumbar region: Secondary | ICD-10-CM

## 2017-09-04 HISTORY — PX: PAIN PUMP IMPLANTATION: SHX330

## 2017-09-04 SURGERY — PAIN PUMP INSERTION
Anesthesia: Choice | Wound class: Clean

## 2017-09-04 MED ORDER — SODIUM CHLORIDE FLUSH 0.9 % IV SOLN
INTRAVENOUS | Status: AC
  Filled 2017-09-04: qty 10

## 2017-09-04 MED ORDER — BUPIVACAINE HCL (PF) 0.5 % IJ SOLN
INTRAMUSCULAR | Status: AC
  Filled 2017-09-04: qty 30

## 2017-09-04 MED ORDER — LACTATED RINGERS IV SOLN
INTRAVENOUS | Status: DC
  Administered 2017-09-04: 13:00:00 via INTRAVENOUS

## 2017-09-04 MED ORDER — CEFAZOLIN SODIUM-DEXTROSE 1-4 GM/50ML-% IV SOLN
INTRAVENOUS | Status: AC
Start: 2017-09-04 — End: 2017-09-04
  Filled 2017-09-04: qty 50

## 2017-09-04 MED ORDER — FENTANYL CITRATE (PF) 100 MCG/2ML IJ SOLN
INTRAMUSCULAR | Status: AC
  Filled 2017-09-04: qty 2

## 2017-09-04 MED ORDER — CEFAZOLIN SODIUM-DEXTROSE 1-4 GM/50ML-% IV SOLN
1.0000 g | Freq: Once | INTRAVENOUS | Status: AC
  Administered 2017-09-04: 1 g via INTRAVENOUS

## 2017-09-04 MED ORDER — LIDOCAINE HCL (PF) 1 % IJ SOLN
INTRAMUSCULAR | Status: AC
  Filled 2017-09-04: qty 30

## 2017-09-04 MED ORDER — FENTANYL CITRATE (PF) 100 MCG/2ML IJ SOLN: 25.0000 ug | INTRAMUSCULAR | Status: DC | PRN

## 2017-09-04 MED ORDER — PROPOFOL 500 MG/50ML IV EMUL
INTRAVENOUS | Status: AC
  Filled 2017-09-04: qty 50

## 2017-09-04 MED ORDER — PROPOFOL 500 MG/50ML IV EMUL
INTRAVENOUS | Status: DC | PRN
  Administered 2017-09-04: 150 ug/kg/min via INTRAVENOUS

## 2017-09-04 MED ORDER — LIDOCAINE HCL 1 % IJ SOLN
INTRAMUSCULAR | Status: DC | PRN
Start: 2017-09-04 — End: 2017-09-04
  Administered 2017-09-04: 10 mL

## 2017-09-04 MED ORDER — PHENYLEPHRINE HCL 10 MG/ML IJ SOLN
INTRAMUSCULAR | Status: DC | PRN
  Administered 2017-09-04 (×2): 100 ug via INTRAVENOUS

## 2017-09-04 MED ORDER — MIDAZOLAM HCL 2 MG/2ML IJ SOLN
INTRAMUSCULAR | Status: AC
  Filled 2017-09-04: qty 2

## 2017-09-04 MED ORDER — ONDANSETRON HCL 4 MG/2ML IJ SOLN: 4.0000 mg | Freq: Once | INTRAMUSCULAR | Status: DC | PRN

## 2017-09-04 MED ORDER — MIDAZOLAM HCL 2 MG/2ML IJ SOLN
INTRAMUSCULAR | Status: DC | PRN
  Administered 2017-09-04: 2 mg via INTRAVENOUS

## 2017-09-04 MED ORDER — FENTANYL CITRATE (PF) 100 MCG/2ML IJ SOLN
INTRAMUSCULAR | Status: DC | PRN
  Administered 2017-09-04: 50 ug via INTRAVENOUS

## 2017-09-04 MED ORDER — PROPOFOL 10 MG/ML IV BOLUS
INTRAVENOUS | Status: DC | PRN
  Administered 2017-09-04: 40 mg via INTRAVENOUS

## 2017-09-04 MED ORDER — BUPIVACAINE HCL 0.5 % IJ SOLN
INTRAMUSCULAR | Status: DC | PRN
  Administered 2017-09-04: 10 mL

## 2017-09-04 SURGICAL SUPPLY — 40 items
BLADE SURG 15 STRL LF DISP TIS (BLADE) ×1 IMPLANT
BLADE SURG 15 STRL SS (BLADE) ×2
DRAPE C-ARM XRAY 36X54 (DRAPES) ×3 IMPLANT
DRAPE CAMERA VIDEO/LASER (DRAPES) ×3 IMPLANT
DRAPE INCISE IOBAN 66X45 STRL (DRAPES) ×3 IMPLANT
DRSG TEGADERM 4X4.75 (GAUZE/BANDAGES/DRESSINGS) ×6 IMPLANT
DRSG TELFA 3X8 NADH (GAUZE/BANDAGES/DRESSINGS) ×6 IMPLANT
DURAPREP 26ML APPLICATOR (WOUND CARE) ×3 IMPLANT
ELECT REM PT RETURN 9FT ADLT (ELECTROSURGICAL) ×3
ELECTRODE REM PT RTRN 9FT ADLT (ELECTROSURGICAL) ×1 IMPLANT
GLOVE BIO SURGEON STRL SZ8 (GLOVE) ×3 IMPLANT
GLOVE SURG XRAY 8.5 LX (GLOVE) ×3 IMPLANT
GOWN STRL REUS W/ TWL LRG LVL3 (GOWN DISPOSABLE) ×1 IMPLANT
GOWN STRL REUS W/ TWL XL LVL3 (GOWN DISPOSABLE) ×1 IMPLANT
GOWN STRL REUS W/TWL LRG LVL3 (GOWN DISPOSABLE) ×2
GOWN STRL REUS W/TWL XL LVL3 (GOWN DISPOSABLE) ×2
KIT REFILL (MISCELLANEOUS) ×2
KIT REFILL CATH SYNCHROMED II (MISCELLANEOUS) ×1 IMPLANT
KIT RM TURNOVER STRD PROC AR (KITS) ×3 IMPLANT
LABEL OR SOLS (LABEL) ×3 IMPLANT
NDL SAFETY ECLIPSE 18X1.5 (NEEDLE) ×1 IMPLANT
NEEDLE HYPO 18GX1.5 SHARP (NEEDLE) ×2
NEEDLE HYPO 25X1 1.5 SAFETY (NEEDLE) ×3 IMPLANT
NEEDLE SPNL 22GX3.5 QUINCKE BK (NEEDLE) ×3 IMPLANT
PACK BASIN MINOR ARMC (MISCELLANEOUS) ×3 IMPLANT
PACK UNIVERSAL (MISCELLANEOUS) ×3 IMPLANT
POUCH TYRX NEURO LRG (Mesh General) ×3 IMPLANT
PUMP SYNCHROMED II 40ML RESVR (Neuro Prosthesis/Implant) ×3 IMPLANT
SOL PREP PVP 2OZ (MISCELLANEOUS) ×3
SOLUTION PREP PVP 2OZ (MISCELLANEOUS) ×1 IMPLANT
STAPLER SKIN PROX 35W (STAPLE) ×3 IMPLANT
SUT ETHIBOND 0 MO6 C/R (SUTURE) ×3 IMPLANT
SUT SILK 0 SH 30 (SUTURE) ×3 IMPLANT
SUT SILK 2 0 SH (SUTURE) ×3 IMPLANT
SUT VIC AB 2-0 SH 27 (SUTURE) ×4
SUT VIC AB 2-0 SH 27XBRD (SUTURE) ×2 IMPLANT
SWABSTK COMLB BENZOIN TINCTURE (MISCELLANEOUS) ×3 IMPLANT
SYR 10ML LL (SYRINGE) ×3 IMPLANT
SYR 3ML LL SCALE MARK (SYRINGE) ×3 IMPLANT
WATER STERILE IRR 1000ML POUR (IV SOLUTION) ×3 IMPLANT

## 2017-09-04 NOTE — Discharge Instructions (Addendum)
Per Dr. Dossie Arbour verbal instructions: Appt.  Dec 6th for staple removal Take dressing off in 48 hours. Able to shower at that time. Do not scrub area during bathing. Pat dry. Change dressing daily after that. Dress with gauze and Tegaderm or tape.  Do not do anything to strain area, limiting pulling and tugging.    AMBULATORY SURGERY  DISCHARGE INSTRUCTIONS  1) The drugs that you were given will stay in your system until tomorrow so for the next 24 hours you should not: A) Drive an automobile B) Make any legal decisions C) Drink any alcoholic beverage  2) You may resume regular meals tomorrow.  Today it is better to start with liquids and gradually work up to solid foods. You may eat anything you prefer, but it is better to start with liquids, then soup and crackers, and gradually work up to solid foods.  3) Please notify your doctor immediately if you have any unusual bleeding, trouble breathing, redness and pain at the surgery site, drainage, fever, or pain not relieved by medication.  4) Additional Instructions:  Please contact your physician with any problems or Same Day Surgery at [phone], Monday through Friday 6 am to 4 pm, or [place] at [facility] number at [phone].

## 2017-09-04 NOTE — Anesthesia Post-op Follow-up Note (Signed)
Anesthesia QCDR form completed.        

## 2017-09-04 NOTE — Transfer of Care (Signed)
Immediate Anesthesia Transfer of Care Note  Patient: Logan Perez  Procedure(s) Performed: Procedure(s): INTRATHECAL PUMP BATTERY CHANGE (N/A)  Patient Location: PACU  Anesthesia Type:General  Level of Consciousness: sedated  Airway & Oxygen Therapy: Patient Spontanous Breathing and Patient connected to face mask oxygen  Post-op Assessment: Report given to RN and Post -op Vital signs reviewed and stable  Post vital signs: Reviewed and stable  Last Vitals:  Vitals:   09/04/17 1224 09/04/17 1426  BP: 112/85 118/81  Pulse: (!) 102   Resp: 16   Temp: 36.7 C (!) 36.2 C  SpO2: 188% 67%    Complications: No apparent anesthesia complications

## 2017-09-04 NOTE — Anesthesia Procedure Notes (Deleted)
Date/Time: 09/04/2017 1:06 PM Performed by: Doreen Salvage, CRNA Pre-anesthesia Checklist: Patient identified, Emergency Drugs available, Suction available and Patient being monitored Patient Re-evaluated:Patient Re-evaluated prior to induction Oxygen Delivery Method: Nasal cannula Induction Type: IV induction Dental Injury: Teeth and Oropharynx as per pre-operative assessment  Comments: Nasal cannula with etCO2 monitoring

## 2017-09-04 NOTE — Anesthesia Procedure Notes (Signed)
Date/Time: 09/04/2017 2:06 PM Performed by: Doreen Salvage, CRNA Pre-anesthesia Checklist: Patient identified, Emergency Drugs available, Suction available and Patient being monitored Patient Re-evaluated:Patient Re-evaluated prior to induction Oxygen Delivery Method: Simple face mask Induction Type: IV induction Dental Injury: Teeth and Oropharynx as per pre-operative assessment

## 2017-09-04 NOTE — Brief Op Note (Signed)
09/04/2017  2:50 PM  PATIENT:  Logan Perez  71 y.o. male  PRE-OPERATIVE DIAGNOSIS:  END OF LIFE [place] PAIN SYNDROME  POST-OPERATIVE DIAGNOSIS:  END OF LIFE BATTERY,CHRONIC PAIN SYNDROME  PROCEDURE:  Procedure(s): INTRATHECAL PUMP BATTERY CHANGE (N/A)  SURGEON:  Surgeon(s) and Role:    * Milinda Pointer, MD - Primary  PHYSICIAN ASSISTANT:   ASSISTANTS: none   ANESTHESIA:   IV sedation  EBL:  10 mL   BLOOD ADMINISTERED:none  DRAINS: none   LOCAL MEDICATIONS USED:  BUPIVICAINE   SPECIMEN:  No Specimen  DISPOSITION OF SPECIMEN:  N/A  COUNTS:  YES  TOURNIQUET:  * No tourniquets in log *  DICTATION: .Dragon Dictation  PLAN OF CARE: Discharge to home after PACU  PATIENT DISPOSITION:  PACU - hemodynamically stable.   Delay start of Pharmacological VTE agent (>24hrs) due to surgical blood loss or risk of bleeding: not applicable

## 2017-09-05 ENCOUNTER — Encounter: Payer: Self-pay | Admitting: Pain Medicine

## 2017-09-05 ENCOUNTER — Other Ambulatory Visit: Payer: Self-pay | Admitting: Hematology and Oncology

## 2017-09-05 DIAGNOSIS — C9001 Multiple myeloma in remission: Secondary | ICD-10-CM

## 2017-09-05 NOTE — Anesthesia Preprocedure Evaluation (Signed)
Anesthesia Evaluation  Patient identified by MRN, date of birth, ID band Patient awake    Reviewed: Allergy & Precautions, H&P , NPO status , Patient's Chart, lab work & pertinent test results, reviewed documented beta blocker date and time   History of Anesthesia Complications (+) history of anesthetic complications  Airway Mallampati: II   Neck ROM: full    Dental  (+) Poor Dentition   Pulmonary neg pulmonary ROS, pneumonia, resolved, former smoker,    Pulmonary exam normal        Cardiovascular hypertension, negative cardio ROS Normal cardiovascular exam+ dysrhythmias  Rhythm:regular Rate:Normal     Neuro/Psych PSYCHIATRIC DISORDERS  Neuromuscular disease CVA, No Residual Symptoms negative neurological ROS  negative psych ROS   GI/Hepatic negative GI ROS, Neg liver ROS, GERD  Medicated,  Endo/Other  negative endocrine ROS  Renal/GU negative Renal ROS  negative genitourinary   Musculoskeletal   Abdominal   Peds  Hematology negative hematology ROS (+) anemia ,   Anesthesia Other Findings Past Medical History: No date: Anxiety No date: Atrial fibrillation (HCC) No date: BPH (benign prostatic hyperplasia) No date: Complication of anesthesia     Comment:  BAD HEADACHE NIGHT OF FIRST CATARACT No date: Compression fracture of lumbar vertebra (HCC) No date: Difficulty voiding No date: Dysrhythmia     Comment:  A FIB No date: Elevated PSA No date: GERD (gastroesophageal reflux disease) No date: Hearing aid worn     Comment:  bilateral No date: History of kidney stones No date: HLD (hyperlipidemia) No date: HOH (hard of hearing) No date: Hypertension No date: Multiple myeloma ([place]) No date: 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 No date: Pain     Comment:  BACK No date: Palpitations No date: Pneumonia No date: Pulmonary embolism (HCC) No date: Sepsis ([place]) No date: Stroke [facility])     Comment:   TIA, detected on CT scan. pt was unaware Past Surgical History: 1994: BACK SURGERY 05/02/2016: CATARACT EXTRACTION W/PHACO; Right     Comment:  Procedure: CATARACT EXTRACTION PHACO AND INTRAOCULAR               LENS PLACEMENT (IOC);  Surgeon: Birder Robson, MD;                Location: [facility];  Service: Ophthalmology;  Laterality:              Right;  [place] 1.06 AP% 20.6 CDE 13.70 FLUID PACK LOT #               [id number] 05/16/2016: CATARACT EXTRACTION W/PHACO; Left     Comment:  Procedure: CATARACT EXTRACTION PHACO AND INTRAOCULAR               LENS PLACEMENT (IOC);  Surgeon: Birder Robson, MD;                Location: [facility];  Service: Ophthalmology;  Laterality:              Left;  [place] 01:43 AP% 19.8 CDE 20.45 FLUID PACK LOT               [id number] 03/22/2017: COLONOSCOPY WITH PROPOFOL; N/A     Comment:  Procedure: COLONOSCOPY WITH PROPOFOL;  Surgeon: Lucilla Lame, MD;  Location: [place];  Service:               Endoscopy;  Laterality: N/A;  has port 03/22/2017: ESOPHAGOGASTRODUODENOSCOPY (EGD) WITH PROPOFOL     Comment:  Procedure: ESOPHAGOGASTRODUODENOSCOPY (EGD) WITH               PROPOFOL;  Surgeon: Lucilla Lame, MD;  Location: [place];  Service: Endoscopy;; No date: EYE SURGERY 1992: KNEE ARTHROSCOPY; Left 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 2012: PAIN PUMP IMPLANTATION 09/04/2017: PAIN PUMP IMPLANTATION; N/A     Comment:  Procedure: INTRATHECAL PUMP BATTERY CHANGE;  Surgeon:               Milinda Pointer, MD;  Location: [facility];  Service:               Neurosurgery;  Laterality: N/A; 12/04/2016: PORTA CATH INSERTION; N/A     Comment:  Procedure: Porta Cath Insertion;  Surgeon: Algernon Huxley,               MD;  Location: [place] [facility];  Service:               Cardiovascular;  Laterality: N/A; 2008: stem cell implant     Comment:  [facility]   Reproductive/Obstetrics negative OB ROS                              Anesthesia Physical Anesthesia Plan  ASA: III  Anesthesia Plan: General   Post-op Pain Management:    Induction:   PONV Risk Score and Plan:   Airway Management Planned:   Additional Equipment:   Intra-op Plan:   Post-operative Plan:   Informed Consent: I have reviewed the patients History and Physical, chart, labs and discussed the procedure including the risks, benefits and alternatives for the proposed anesthesia with the patient or authorized representative who has indicated his/her understanding and acceptance.   Dental Advisory Given  Plan Discussed with: CRNA  Anesthesia Plan Comments:         Anesthesia Quick Evaluation

## 2017-09-05 NOTE — Anesthesia Postprocedure Evaluation (Signed)
Anesthesia Post Note  Patient: Logan Perez  Procedure(s) Performed: INTRATHECAL PUMP BATTERY CHANGE (N/A )  Patient location during evaluation: PACU Anesthesia Type: General Level of consciousness: awake and alert Pain management: pain level controlled Vital Signs Assessment: post-procedure vital signs reviewed and stable Respiratory status: spontaneous breathing, nonlabored ventilation, respiratory function stable and patient connected to nasal cannula oxygen Cardiovascular status: blood pressure returned to baseline and stable Postop Assessment: no apparent nausea or vomiting Anesthetic complications: no     Last Vitals:  Vitals:   09/04/17 1522 09/04/17 1557  BP: (!) 143/102 134/77  Pulse: 67 68  Resp: 18 20  Temp: (!) 36.2 C   SpO2: 100% 100%    Last Pain:  Vitals:   09/05/17 0840  TempSrc:   PainSc: 0-No pain                 Molli Barrows

## 2017-09-06 ENCOUNTER — Other Ambulatory Visit: Payer: Self-pay | Admitting: Hematology and Oncology

## 2017-09-06 ENCOUNTER — Inpatient Hospital Stay: Payer: Medicare Other

## 2017-09-06 ENCOUNTER — Encounter: Payer: Self-pay | Admitting: Hematology and Oncology

## 2017-09-06 ENCOUNTER — Other Ambulatory Visit: Payer: Self-pay | Admitting: *Deleted

## 2017-09-06 ENCOUNTER — Inpatient Hospital Stay: Payer: Medicare Other | Attending: Hematology and Oncology | Admitting: Hematology and Oncology

## 2017-09-06 ENCOUNTER — Other Ambulatory Visit: Payer: Self-pay

## 2017-09-06 VITALS — BP 146/88 | HR 90 | Temp 97.8°F | Resp 12 | Ht 67.0 in | Wt 165.7 lb

## 2017-09-06 DIAGNOSIS — C9001 Multiple myeloma in remission: Secondary | ICD-10-CM | POA: Insufficient documentation

## 2017-09-06 DIAGNOSIS — M549 Dorsalgia, unspecified: Secondary | ICD-10-CM | POA: Diagnosis not present

## 2017-09-06 DIAGNOSIS — G8929 Other chronic pain: Secondary | ICD-10-CM | POA: Insufficient documentation

## 2017-09-06 DIAGNOSIS — R197 Diarrhea, unspecified: Secondary | ICD-10-CM | POA: Diagnosis not present

## 2017-09-06 DIAGNOSIS — Z8673 Personal history of transient ischemic attack (TIA), and cerebral infarction without residual deficits: Secondary | ICD-10-CM | POA: Insufficient documentation

## 2017-09-06 DIAGNOSIS — D638 Anemia in other chronic diseases classified elsewhere: Secondary | ICD-10-CM | POA: Insufficient documentation

## 2017-09-06 DIAGNOSIS — I4891 Unspecified atrial fibrillation: Secondary | ICD-10-CM | POA: Diagnosis not present

## 2017-09-06 DIAGNOSIS — Z7901 Long term (current) use of anticoagulants: Secondary | ICD-10-CM | POA: Diagnosis not present

## 2017-09-06 DIAGNOSIS — C9002 Multiple myeloma in relapse: Secondary | ICD-10-CM

## 2017-09-06 DIAGNOSIS — Z7984 Long term (current) use of oral hypoglycemic drugs: Secondary | ICD-10-CM | POA: Diagnosis not present

## 2017-09-06 DIAGNOSIS — Z87311 Personal history of (healed) other pathological fracture: Secondary | ICD-10-CM | POA: Diagnosis not present

## 2017-09-06 DIAGNOSIS — E785 Hyperlipidemia, unspecified: Secondary | ICD-10-CM | POA: Diagnosis not present

## 2017-09-06 DIAGNOSIS — C9000 Multiple myeloma not having achieved remission: Secondary | ICD-10-CM

## 2017-09-06 DIAGNOSIS — Z9484 Stem cells transplant status: Secondary | ICD-10-CM | POA: Insufficient documentation

## 2017-09-06 DIAGNOSIS — Z978 Presence of other specified devices: Secondary | ICD-10-CM | POA: Insufficient documentation

## 2017-09-06 DIAGNOSIS — Z5112 Encounter for antineoplastic immunotherapy: Secondary | ICD-10-CM | POA: Insufficient documentation

## 2017-09-06 DIAGNOSIS — Z86711 Personal history of pulmonary embolism: Secondary | ICD-10-CM | POA: Diagnosis not present

## 2017-09-06 DIAGNOSIS — I10 Essential (primary) hypertension: Secondary | ICD-10-CM | POA: Diagnosis not present

## 2017-09-06 DIAGNOSIS — K219 Gastro-esophageal reflux disease without esophagitis: Secondary | ICD-10-CM | POA: Diagnosis not present

## 2017-09-06 DIAGNOSIS — F418 Other specified anxiety disorders: Secondary | ICD-10-CM | POA: Insufficient documentation

## 2017-09-06 DIAGNOSIS — G629 Polyneuropathy, unspecified: Secondary | ICD-10-CM | POA: Insufficient documentation

## 2017-09-06 DIAGNOSIS — Z87442 Personal history of urinary calculi: Secondary | ICD-10-CM | POA: Insufficient documentation

## 2017-09-06 DIAGNOSIS — Z800 Family history of malignant neoplasm of digestive organs: Secondary | ICD-10-CM | POA: Insufficient documentation

## 2017-09-06 DIAGNOSIS — N400 Enlarged prostate without lower urinary tract symptoms: Secondary | ICD-10-CM

## 2017-09-06 DIAGNOSIS — Z87891 Personal history of nicotine dependence: Secondary | ICD-10-CM | POA: Diagnosis not present

## 2017-09-06 DIAGNOSIS — Z79899 Other long term (current) drug therapy: Secondary | ICD-10-CM

## 2017-09-06 LAB — CBC WITH DIFFERENTIAL/PLATELET
Basophils Absolute: 0 10*3/uL (ref 0–0.1)
Basophils Relative: 0 %
Eosinophils Absolute: 0 10*3/uL (ref 0–0.7)
Eosinophils Relative: 0 %
HCT: 35.6 % — ABNORMAL LOW (ref 40.0–52.0)
Hemoglobin: 12.2 g/dL — ABNORMAL LOW (ref 13.0–18.0)
Lymphocytes Relative: 10 %
Lymphs Abs: 1.1 10*3/uL (ref 1.0–3.6)
MCH: 35.1 pg — ABNORMAL HIGH (ref 26.0–34.0)
MCHC: 34.2 g/dL (ref 32.0–36.0)
MCV: 102.5 fL — ABNORMAL HIGH (ref 80.0–100.0)
Monocytes Absolute: 0.2 10*3/uL (ref 0.2–1.0)
Monocytes Relative: 2 %
Neutro Abs: 9.8 10*3/uL — ABNORMAL HIGH (ref 1.4–6.5)
Neutrophils Relative %: 88 %
Platelets: 174 10*3/uL (ref 150–440)
RBC: 3.48 MIL/uL — ABNORMAL LOW (ref 4.40–5.90)
RDW: 14.6 % — ABNORMAL HIGH (ref 11.5–14.5)
WBC: 11.2 10*3/uL — ABNORMAL HIGH (ref 3.8–10.6)

## 2017-09-06 LAB — COMPREHENSIVE METABOLIC PANEL
ALT: 14 U/L — ABNORMAL LOW (ref 17–63)
AST: 24 U/L (ref 15–41)
Albumin: 4 g/dL (ref 3.5–5.0)
Alkaline Phosphatase: 51 U/L (ref 38–126)
Anion gap: 7 (ref 5–15)
BUN: 20 mg/dL (ref 6–20)
CO2: 22 mmol/L (ref 22–32)
Calcium: 9.1 mg/dL (ref 8.9–10.3)
Chloride: 107 mmol/L (ref 101–111)
Creatinine, Ser: 1.31 mg/dL — ABNORMAL HIGH (ref 0.61–1.24)
GFR calc Af Amer: >60 mL/min (ref >60)
GFR calc non Af Amer: 53 mL/min — ABNORMAL LOW (ref >60)
Glucose, Bld: 192 mg/dL — ABNORMAL HIGH (ref 65–99)
Potassium: 4.4 mmol/L (ref 3.5–5.1)
Sodium: 136 mmol/L (ref 135–145)
Total Bilirubin: 0.7 mg/dL (ref 0.3–1.2)
Total Protein: 6.5 g/dL (ref 6.5–8.1)

## 2017-09-06 LAB — MAGNESIUM: Magnesium: 1.9 mg/dL (ref 1.7–2.4)

## 2017-09-06 MED ORDER — DIPHENHYDRAMINE HCL 25 MG PO CAPS: 50.0000 mg | ORAL_CAPSULE | Freq: Once | ORAL | Status: DC

## 2017-09-06 MED ORDER — SODIUM CHLORIDE 0.9 % IV SOLN
Freq: Once | INTRAVENOUS | Status: AC
  Administered 2017-09-06: 10:00:00 via INTRAVENOUS
  Filled 2017-09-06: qty 1000

## 2017-09-06 MED ORDER — ACETAMINOPHEN 325 MG PO TABS: 650.0000 mg | ORAL_TABLET | Freq: Once | ORAL | Status: DC

## 2017-09-06 MED ORDER — SODIUM CHLORIDE 0.9 % IV SOLN
1200.0000 mg | Freq: Once | INTRAVENOUS | Status: AC
  Administered 2017-09-06: 1200 mg via INTRAVENOUS
  Filled 2017-09-06: qty 60

## 2017-09-06 MED ORDER — ONDANSETRON 8 MG PO TBDP: 8.0000 mg | ORAL_TABLET | Freq: Once | ORAL | Status: DC

## 2017-09-06 MED ORDER — HEPARIN SOD (PORK) LOCK FLUSH 100 UNIT/ML IV SOLN
500.0000 [IU] | Freq: Once | INTRAVENOUS | Status: AC | PRN
  Administered 2017-09-06: 500 [IU]
  Filled 2017-09-06: qty 5

## 2017-09-06 MED ORDER — DEXAMETHASONE 4 MG PO TABS: ORAL_TABLET | ORAL | 1 refills | Status: DC

## 2017-09-06 MED ORDER — SODIUM CHLORIDE 0.9% FLUSH
10.0000 mL | INTRAVENOUS | Status: DC | PRN
  Administered 2017-09-06: 10 mL
  Filled 2017-09-06: qty 10

## 2017-09-06 NOTE — Progress Notes (Signed)
Patient here for follow up. He has questions regarding his creatine level and needs a refill for dexamethasone refilled.

## 2017-09-06 NOTE — Progress Notes (Signed)
[facility] day:  09/06/17  Chief Complaint: Logan Perez is a 71 y.o. 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   HPI: The patient was last seen in the medical oncology clinic on 08/02/2017.  At that time, he felt "great".  He was gaining weight. He continued to have loose stools that were controlled with Imodium 2/day. Exam was unremarkable. WBC was 7300 with an Landa of 4100. Hemoglobin was 12.4, hematocrit 35.7, platelets 204,000.  SPEP revealed no monoclonal protein.  He was seen by Dr. Ok Edwards at the BMT service at [facility] on 08/28/2017.  He received his 12 month vaccines.  He has been referred for his shingles vaccine.  He has a follow-up in 5 months.  During the interim, patient has been doing well overall. Patient notes some "soreness"  following a recent pain pump (battery) replacement. Patient denies any bruising or bleeding. He has not experienced any B symptoms or interval infections. Patient eating well, and his weight has remained stable.  He plans to get the shingles vaccine soon.   Past Medical History:  Diagnosis Date  . Anxiety   . Atrial fibrillation ([place])   . BPH (benign prostatic hyperplasia)   . Complication of anesthesia    BAD HEADACHE NIGHT OF FIRST CATARACT  . Compression fracture of lumbar vertebra ([place])   . Difficulty voiding   . Dysrhythmia    A FIB  . Elevated PSA   . GERD (gastroesophageal reflux disease)   . Hearing aid worn    bilateral  . History of kidney stones   . HLD (hyperlipidemia)   . HOH (hard of hearing)   . Hypertension   . Multiple myeloma ([place])   . Neuropathy    feet. 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  . Pain    BACK  . Palpitations   . Pneumonia   . Pulmonary embolism ([place])   . Sepsis ([place])   . Stroke [facility])    TIA, detected on CT scan. pt  was unaware    Past Surgical History:  Procedure Laterality Date  . BACK SURGERY  1994  . CATARACT EXTRACTION W/PHACO Right 05/02/2016   Procedure: CATARACT EXTRACTION PHACO AND INTRAOCULAR LENS PLACEMENT (IOC);  Surgeon: Birder Robson, MD;  Location: [facility] ORS;  Service: Ophthalmology;  Laterality: Right;  [place] 1.06 AP% 20.6 CDE 13.70 FLUID PACK LOT # [id number]  . CATARACT EXTRACTION W/PHACO Left 05/16/2016   Procedure: CATARACT EXTRACTION PHACO AND INTRAOCULAR LENS PLACEMENT (IOC);  Surgeon: Birder Robson, MD;  Location: [facility] ORS;  Service: Ophthalmology;  Laterality: Left;  [place] 01:43 AP% 19.8 CDE 20.45 FLUID PACK LOT #[id number] H  . COLONOSCOPY WITH PROPOFOL N/A 03/22/2017   Procedure: COLONOSCOPY WITH PROPOFOL;  Surgeon: Lucilla Lame, MD;  Location: [place];  Service: Endoscopy;  Laterality: N/A;  has port  . ESOPHAGOGASTRODUODENOSCOPY (EGD) WITH PROPOFOL  03/22/2017   Procedure: ESOPHAGOGASTRODUODENOSCOPY (EGD) WITH PROPOFOL;  Surgeon: Lucilla Lame, MD;  Location: [place];  Service: Endoscopy;;  . EYE SURGERY    . KNEE ARTHROSCOPY Left 1992  . LIMBAL STEM CELL TRANSPLANT    . PAIN PUMP IMPLANTATION  2012  . PAIN PUMP IMPLANTATION N/A 09/04/2017   Procedure: INTRATHECAL PUMP BATTERY CHANGE;  Surgeon: Milinda Pointer, MD;  Location: [facility] OR[facility];  Service: Neurosurgery;  Laterality: N/A;  . PORTA CATH  INSERTION N/A 12/04/2016   Procedure: Glori Luis Cath Insertion;  Surgeon: Algernon Huxley, MD;  Location: [place] [facility];  Service: Cardiovascular;  Laterality: N/A;  . stem cell implant  2008   [facility]    Family History  Problem Relation Age of Onset  . Cancer Father        throat  . Kidney disease Sister   . Stroke Unknown   . Stroke Mother   . Bladder Cancer Neg Hx   . Prostate cancer Neg Hx   . Kidney cancer Neg Hx     Social History:  reports that he quit smoking about 41 years ago. His smoking use included cigarettes. He has a 30.00 pack-year smoking  history. he has never used smokeless tobacco. He reports that he does not drink alcohol or use drugs.  He has a wire haired dachshund.  He lives in [place].  His wife's name is [name].  The patient is alone today.  Allergies:  Allergies  Allergen Reactions  . Azithromycin Diarrhea and Other (See Comments)    Possible cause of C. Diff  . Bee Pollen Other (See Comments)    Sneezing, watery eyes, runny nose  . Pollen Extract Other (See Comments)    Sneezing, watery eyes, runny nose  . Zometa [Zoledronic Acid] Other (See Comments)    ONG- Osteonecrosis of the jaw   . Rivaroxaban Rash    Current Medications: Current Outpatient Medications  Medication Sig Dispense Refill  . acetaminophen (TYLENOL) 325 MG tablet Take 650 mg every 6 (six) hours as needed by mouth for mild pain or headache.     . ALPRAZolam (XANAX) 0.25 MG tablet TAKE 1 TABLET BY MOUTH EVERY NIGHT AT BEDTIME, AS NEEDED FOR ANXIETY (Patient taking differently: Take 0.25 mg at bedtime as needed by mouth for anxiety. ) 30 tablet 1  . baclofen (LIORESAL) 10 MG tablet Take 1 tablet (10 mg total) by mouth at bedtime. 30 tablet 5  . Calcium Carb-Cholecalciferol (CALCIUM-VITAMIN D) 500-400 MG-UNIT TABS Take 1 tablet daily by mouth.    . cetirizine (ZYRTEC) 10 MG tablet Take 10 mg by mouth daily as needed for allergies.     . Cholecalciferol (VITAMIN D3) 2000 units capsule Take 2,000 Units daily by mouth.     . Daratumumab (DARZALEX IV) Inject 1,200 mg every 30 (thirty) days into the vein.     [name] dexamethasone (DECADRON) 4 MG tablet Take 4 tabs PO 1 hr prior to infusion, then take 1 tab on day 2 and day 3 30 tablet 1  . diltiazem (CARDIZEM CD) 120 MG 24 hr capsule Take 120 mg daily by mouth.     . diltiazem (CARDIZEM) 60 MG tablet Take 60 mg daily as needed by mouth (for increased heart rate > 140).     [name] diphenhydrAMINE (BENADRYL) 25 MG tablet Take 25 mg every 6 (six) hours as needed by mouth for itching or allergies.     [name] ELIQUIS 5 MG  TABS tablet TAKE 1 TABLET(5 MG) BY MOUTH TWICE DAILY 60 tablet 0  . ENSURE (ENSURE) Take 237 mLs by mouth daily.    . fluticasone (FLONASE) 50 MCG/ACT nasal spray Place 1 spray into the nose daily as needed for allergies.     . Hypromellose (ARTIFICIAL TEARS OP) Place 1 drop as needed into both eyes (for dry eyes).    [name] ibuprofen (ADVIL,MOTRIN) 200 MG tablet Take 400 mg every 6 (six) hours as needed by mouth for headache or moderate  pain.    . loperamide (IMODIUM) 2 MG capsule Take 4 mg as needed by mouth for diarrhea or loose stools.     . lovastatin (MEVACOR) 20 MG tablet Take 20 mg by mouth every evening.     . montelukast (SINGULAIR) 10 MG tablet TAKE 1 TABLET BY MOUTH ONCE A DAY AS DIRECTED. (Patient taking differently: Take 49ms daily the day before the infusion, the day of, and the 2 days after infusion) 30 tablet 0  . Multiple Vitamins-Iron (MULTIVITAMIN/IRON PO) Take 1 tablet daily by mouth.    . naproxen sodium (ANAPROX) 220 MG tablet Take 220 mg daily as needed by mouth (for pain or headaches).     .Marland Kitchenomeprazole (PRILOSEC) 20 MG capsule Take 20 mg by mouth daily.     . ondansetron (ZOFRAN) 8 MG tablet Take 8 mg every 8 (eight) hours as needed by mouth for nausea or vomiting.    . potassium chloride SA (K-DUR,KLOR-CON) 20 MEQ tablet TAKE 1 TABLET(20 MEQ) BY MOUTH DAILY 90 tablet 0  . ranitidine (ZANTAC) 150 MG tablet Take 150 mg by mouth at bedtime.     . tamsulosin (FLOMAX) 0.4 MG CAPS capsule Take 1 capsule (0.4 mg total) by mouth daily. 90 capsule 4  . valACYclovir (VALTREX) 500 MG tablet TAKE 1 TABLET BY MOUTH DAILY (Patient taking differently: TAKE 500 MG BY MOUTH DAILY) 30 tablet 0  . vitamin B-12 (CYANOCOBALAMIN) 1000 MCG tablet Take 1,000 mcg daily by mouth.     .Marland KitchenPAIN MANAGEMENT IT PUMP REFILL 1 each once for 1 dose by Intrathecal route. Medication: PF Fentanyl 1,500.0 mcg/ml PF Bupivicaine 30.0 mg/ml PF Clonidine 300.0 mcg/ml Total Volume: 40 ml Needed by 09-04-17 @ 1000   Please deliver to the OR 1 each 0   No current facility-administered medications for this visit.    Facility-Administered Medications Ordered in Other Visits  Medication Dose Route Frequency Provider Last Rate Last Dose  . 0.9 %  sodium chloride infusion   Intravenous Once CNolon StallsC, MD      . acetaminophen (TYLENOL) tablet 650 mg  650 mg Oral Once CLequita Asal MD      . daratumumab (DARZALEX) 1,180 mg in sodium chloride 0.9 % 441 mL chemo infusion  16 mg/kg (Treatment Plan Recorded) Intravenous Once Corcoran, Melissa C, MD      . diphenhydrAMINE (BENADRYL) capsule 50 mg  50 mg Oral Once Corcoran, Melissa C, MD      . heparin lock flush 100 unit/mL  500 Units Intracatheter Once PRN CNolon StallsC, MD      . ondansetron (ZOFRAN-ODT) disintegrating tablet 8 mg  8 mg Oral Once CLequita Asal MD        Review of Systems:  GENERAL: Feels "good".  Energy level is normal.  No fevers or sweats.  Weight stable. PERFORMANCE STATUS (ECOG): 1 HEENT:  Cataracts s/p surgery.  No sore throat, mouth sores or tenderness. Lungs:  No shortness of breath.  No cough.  No hemoptysis. Cardiac:  No chest pain, palpitations, orthopnea, or PND. GI:  Appetite good. Diarrhea, managed (2 Imodium/day).  No vomiting, constipation, melena or hematochezia. GU:  No urgency, frequency, dysuria, or hematuria. Musculoskeletal:  Compression fracture of back on a pain pump (s/p recent battery replacement). No muscle tenderness. Extremities:  No pain or swelling. Skin:  Fragile skin.  No rashes or skin changes. Neuro:  Little tremor (chronic).  Neuropathy in feet.  Sensitivity in hands.  No headache, weakness,  balance or coordination issues. Endocrine:  No diabetes, thyroid issues, hot flashes or night sweats. Psych:  No mood changes, depression or anxiety. Pain:  Chronic back pain (pain well managed with pump). 1/10 "soreness" following pain pump replacement.  Review of systems:  All other systems  reviewed and found to be negative.   Physical Exam: Blood pressure (!) 146/88, pulse 90, temperature 97.8 F (36.6 C), temperature source Oral, resp. rate 12, height '5\' 7"'  (1.702 m), weight 165 lb 11.2 oz (75.2 kg), SpO2 100 %. GENERAL:  Well developed, well nourished gentleman sitting comfortably in the exam room in no acute distress.  MENTAL STATUS:  Alert and oriented to person, place and time. HEAD:  Short brown hair.  Graying goatee.  Normocephalic, atraumatic, face symmetric, no Cushingoid features. EYES:  Glasses.  Blue eyes s/p cataract surgery.  Pupils equal round and reactive to light and accomodation.  No conjunctivitis or scleral icterus. ENT:  Hearing aide.  Oropharynx clear without lesion.  Hearing aide.  Tongue normal. Mucous membranes moist.  RESPIRATORY:  Clear to auscultation without rales, wheezes or rhonchi. CARDIOVASCULAR:  Regular rate and rhythm without murmur, rub or gallop. ABDOMEN:  RUQ pain pump with overlying dressing.  Small amount of old dried blood.  Soft, non-tender, with active bowel sounds, and no hepatosplenomegaly.  No masses. SKIN:  No rashes, ulcers or lesions. EXTREMITIES: No edema, skin discoloration or tenderness.  No palpable cords. LYMPH NODES: No palpable cervical, supraclavicular, axillary or inguinal adenopathy  NEUROLOGICAL: Intention tremor. PSYCH:  Appropriate.    Appointment on 09/06/2017  Component Date Value Ref Range Status  . Magnesium 09/06/2017 1.9  1.7 - 2.4 mg/dL Final  . Sodium 09/06/2017 136  135 - 145 mmol/L Final  . Potassium 09/06/2017 4.4  3.5 - 5.1 mmol/L Final  . Chloride 09/06/2017 107  101 - 111 mmol/L Final  . CO2 09/06/2017 22  22 - 32 mmol/L Final  . Glucose, Bld 09/06/2017 192* 65 - 99 mg/dL Final  . BUN 09/06/2017 20  6 - 20 mg/dL Final  . Creatinine, Ser 09/06/2017 1.31* 0.61 - 1.24 mg/dL Final  . Calcium 09/06/2017 9.1  8.9 - 10.3 mg/dL Final  . Total Protein 09/06/2017 6.5  6.5 - 8.1 g/dL Final  . Albumin  09/06/2017 4.0  3.5 - 5.0 g/dL Final  . AST 09/06/2017 24  15 - 41 U/L Final  . ALT 09/06/2017 14* 17 - 63 U/L Final  . Alkaline Phosphatase 09/06/2017 51  38 - 126 U/L Final  . Total Bilirubin 09/06/2017 0.7  0.3 - 1.2 mg/dL Final  . GFR calc non Af Amer 09/06/2017 53* >60 mL/min Final  . GFR calc Af Amer 09/06/2017 >60  >60 mL/min Final   Comment: (NOTE) The eGFR has been calculated using the CKD EPI equation. This calculation has not been validated in all clinical situations. eGFR's persistently <60 mL/min signify possible Chronic Kidney Disease.   . Anion gap 09/06/2017 7  5 - 15 Final  . WBC 09/06/2017 11.2* 3.8 - 10.6 K/uL Final  . RBC 09/06/2017 3.48* 4.40 - 5.90 MIL/uL Final  . Hemoglobin 09/06/2017 12.2* 13.0 - 18.0 g/dL Final  . HCT 09/06/2017 35.6* 40.0 - 52.0 % Final  . MCV 09/06/2017 102.5* 80.0 - 100.0 fL Final  . MCH 09/06/2017 35.1* 26.0 - 34.0 pg Final  . MCHC 09/06/2017 34.2  32.0 - 36.0 g/dL Final  . RDW 09/06/2017 14.6* 11.5 - 14.5 % Final  . Platelets 09/06/2017 174  150 - 440 K/uL Final  . Neutrophils Relative % 09/06/2017 88  % Final  . Neutro Abs 09/06/2017 9.8* 1.4 - 6.5 K/uL Final  . Lymphocytes Relative 09/06/2017 10  % Final  . Lymphs Abs 09/06/2017 1.1  1.0 - 3.6 K/uL Final  . Monocytes Relative 09/06/2017 2  % Final  . Monocytes Absolute 09/06/2017 0.2  0.2 - 1.0 K/uL Final  . Eosinophils Relative 09/06/2017 0  % Final  . Eosinophils Absolute 09/06/2017 0.0  0 - 0.7 K/uL Final  . Basophils Relative 09/06/2017 0  % Final  . Basophils Absolute 09/06/2017 0.0  0 - 0.1 K/uL Final    Assessment:  Shellie Goettl is a 71 y.o. male with stage III mutiple myeloma.  He initially presented with progressive back pain beginning in 12/2006.  MRI revealed "spots and compression fractures".  He began Velcade, thalidomide, and Decadron.  In 08/2007, he underwent high dose chemotherapy and autologous stem cell transplant.  He underwent 2nd autologous stem cell  transplant on 06/16/2016.  He recurred with a rising M-spike (2.7) with repeat M spike (1.7 gm/dl) in 03/2010.  He was initially treated with Velcade (02/08/2010 - 05/10/2010).  He then began Revlimid (15 mg 3 weeks on/1 week off) and Decadron (40 mg on day 1, 8, 15, 22).  Because of significant side effect with Decadron his dose was decreased to 10 mg once a week in 07/2010.    He was on maintenance Revlimid. Revlimid was initially10 mg 3 weeks on/1 week off. This was changed to 10 mg 2 weeks on/2 weeks off secondary to right nipple tenderness. His dose was increased to 10 mg 3 weeks on/1 week off with Decadron 10 mg a week (on Sundays) and then Revlamid 15 mg 3 weeks on and 1 week off with Decadron on Sundays.  He began Pomalyst 4 mg 3 weeks on/1 week off with Decadron on 08/27/2015.  Over the past year his SPEP has revealed no monoclonal protein (04/21/2015) and 0.5 gm/dL on 09/22/2015 and 10/20/2015. M spike was 0.1 on 02/02/2016, 03/01/2016, 03/29/2016, 04/26/2016, and 05/24/2016.  M spike was 0 on 10/11/2016, 11/28/2016, 02/05/2017, 03/21/2017, and 08/02/2017.  Free light chains have been monitored. Kappa free light chains were 18.54 on 11/21/2013, 18.37 on 02/20/2014, 18.93 on 05/22/2014, 32.58 (high; normal ratio 1.27) on 08/21/2014, 51.53 (high; elevated ratio of 2.12) on 11/13/2014, 28.08 (ratio 1.73) on 12/09/2014, 23.71 (ratio 2.17) on 01/18/2015, 92.93 (ratio 9.49) on 04/21/2015, 93.44 (ratio 10.28) on 05/26/2015, 255.45 (ratio of 24.05) on 07/14/2015, 373.89 (ratio 48.31) on 08/04/2015, 474.33 (ratio 70.58) on 08/25/2015, 450.76 (ratio 55.44) on 09/22/2015, 453.4 (ratio 59.89) on 10/20/2015, 58.26 (ratio of > 40.74) on 02/02/2016, 62.67 (ratio of 37.98) on 03/01/2016, 75.7 (ratio > 50.47) on 03/29/2016, 79.7 (ratio 53.13) on 04/26/2016, 108.6 (ratio > 72.4) on 05/24/2016, 8.3 (1.46 ratio) on 10/11/2016, 6.7 (0.81 ratio) on 02/05/2017, 7.8 (1.01 ratio) on 03/21/2017, and 7.6 (ratio .63) on  06/01/2017.  Bone survey on 12/08/2014 was stable.  Bone survey on 10/21/2015 revealed increase conspicuity of subcentimeter lytic lesions in the calvarium.  Bone marrow aspirate and biopsy on 11/04/2015 revealed an atypical monoclonal plasma cells estimated at 30-40% of marrow cells.   Marrow was variably cellular (approximately 45%) with background trilineage hematopoiesis. There was no significant increase in marrow reticulin fibers. Storage iron was present.    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 He develoed herpes zoster in 04/2008. He developed a  pulmonary embolism in 05/2013. He was initially on Xarelto, but is now on Eliquis. He had an episode of pneumonia around this time requiring a brief admission. He developed severe lower leg cramps on 08/07/2014 secondary to hypokalemia. Duplex was negative.   He was treated for C difficile colitis (Flagyl completed 07/30/2015).  He has a chronic indwelling pain pump.  He received 4 cycles of Pomalyst and Decadron (08/27/2015 - 11/19/2015).  Restaging studies document progressive disease.  Kappa free light chains are increasing.  SPEP revealed 0.5 gm/dL monoclonal protein then 1.3 gm/dL.  Bone survey reveals increase conspicuity of subcentimeter lytic lesions in the calvarium.  Bone marrow reveals 30-40% plasma cells.   MUGA on 11/30/2015 revealed an ejection fraction of 46%.  He is felt not to be a good candidate for Kyprolis.  There were no focal wall motion abnormalities.  He had a stress echo less than 1 year ago.  He has a history of PVCs and atrial fibrillation.  He takes Cardizem prn.  He received 17 weeks of daratumumab (Darzalex) (12/09/2015 - 05/25/2016).  He tolerated treatment well without side effect.  Bone marrow on 02/09/2016 revealed no diagnostic morphologic evidence of plasma cell myeloma.  Marrow was normocellular to hypocellular marrow for age (ranging from 10-40%)  with maturing trilineage hematopoiesis and mild multilineage dyspoiesis.  There was patchy mild increase in reticulin.  Storage iron was present.  Flow cytometry revealed no definitive evidence of monoclonality.  There was a non-specific atypical myeloid and monocytic findings with no increase in blasts.  Cytogenetics were normal (46, XY).  He is currently 14 months s/p 2nd autologous stem cell transplant on 06/16/2016.  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  He also experienced atrial fibrillation with intermittent episodes of RVR requiring IV beta blockers.  He is on prophylactic valacyclovir for 1 year post transplant.  He started vaccinations (DTaP-Pediatric triple vaccine, Hep B- Pediatrix triple vaccine, Haemophilus influenza B (Hib), inactivated polio virus (IPV), pneumococcal conjugate vaccine 13-valent (PCV 13)) on 04/17/2017.  Recommendation was for Shingrix vaccine to be given in [place] (not done) and second dose of Shingrix in 2 months.  He has follow-up vaccinations in 3 months (07/18/2017).    He is s/p 8 cycles of daratumumab (Darzalex) post transplant (12/05/2016 - 08/02/2017).  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  Work-up on 08/23/2016 revealed the following normal studies:  B12 and folate.  Ferritin was 1379 (high).  Iron studies included a saturation of 56% and TIBC 141 (low).  ESR was 64.  Thrombocytopenia has resolved.  He has a history of persistent hypomagnesemia and receives IV magnesium (2-4 gm) weekly.  He has not required magnesium since 10/24/2016.  Symptomatically, he feels "pretty good".   Weight is stable. He has loose stools that is controlled with Imodium 2/day. Exam reveals surgical wound to RIGHT lower abdomen. Dressing in place with dried blood. WBC is 11200 with an [place] of 9800. Hemoglobin 12.2, hematocrit 35.6, platelets 174,000.  Creatinine is 1.31 (stable).  Plan:  1.  Labs  today:  CBC with diff, CMP, Mg, SPEP. 2.  Patient to take own premeds from home:  Ondansetron (8 mg), Tylenol (650 mg), and Benadryl (50 mg). 3.  Cycle #9 Daratumumab today. 4.  Refilled Rx: Decadron 43m tabs (Disp #30 with 1 refill) 5.  RTC on 10/04/2017 for MD assessment, labs (CBC with diff, CMP, Mg, SPEP, FLCA), and cycle #10 daratumumab.  Honor Loh, FNP-C 09/06/2017, 9:53 AM   I saw and evaluated the patient, participating in the key portions of the service and reviewing pertinent diagnostic studies and records.  I reviewed the nurse practitioner's note and agree with the findings and the plan.  The assessment and plan were discussed with the patient.  Multiple questions were asked by the patient and answered.   Lequita Asal, MD  09/06/2017 9:53 AM

## 2017-09-10 LAB — PROTEIN ELECTROPHORESIS, SERUM
A/G Ratio: 1.5 (ref 0.7–1.7)
Albumin ELP: 3.5 g/dL (ref 2.9–4.4)
Alpha-1-Globulin: 0.2 g/dL (ref 0.0–0.4)
Alpha-2-Globulin: 0.9 g/dL (ref 0.4–1.0)
Beta Globulin: 0.9 g/dL (ref 0.7–1.3)
Gamma Globulin: 0.3 g/dL — ABNORMAL LOW (ref 0.4–1.8)
Globulin, Total: 2.4 g/dL (ref 2.2–3.9)
Total Protein ELP: 5.9 g/dL — ABNORMAL LOW (ref 6.0–8.5)

## 2017-09-12 NOTE — Progress Notes (Signed)
Patient's Name: Logan Perez  MRN: [id number]  Referring Provider: Sofie Hartigan, MD  DOB: 1946-10-05  PCP: Sofie Hartigan, MD  DOS: 09/13/2017  Note by: Gaspar Cola, MD  Service setting: Ambulatory outpatient  Specialty: Interventional Pain Management  Location: [facility] (AMB) Pain Management Facility    Patient type: Established   Primary Reason(s) for Visit: Evaluation of chronic illnesses with exacerbation, or progression (Level of risk: moderate) CC: Back Pain (lower)  HPI  Logan Perez is a 71 y.o. year old, male patient, who comes today for a follow-up evaluation. He has BPH with obstruction/lower urinary tract symptoms; Multiple myeloma ([place]); Diarrhea; Clostridium difficile diarrhea; Essential (primary) hypertension; Acid reflux; Combined fat and carbohydrate induced hyperlipemia; MI (mitral incompetence); TI (tricuspid incompetence); Paroxysmal atrial fibrillation ([place]); Pulmonary embolism ([place]); Breathlessness on exertion; Presence of intrathecal pump; Long term current use of opiate analgesic; Long term prescription opiate use; Opiate use; Encounter for therapeutic drug level monitoring; Night muscle spasms; Muscle spasticity; Neuropathic pain; Neurogenic pain; Chronic low back pain; Lumbar spondylosis; Compression fracture of T12 vertebra (HCC) (70-75% magnitude) (with mild retropulsion); Diffuse myofascial pain syndrome; Presence of implanted infusion pump (Medtronic, programmable, intrathecal pump); Cancer associated pain; Encounter for interrogation of infusion pump; Chronic lumbar radicular pain; Opiate analgesic contract exists; Chronic pain syndrome; Chronic anticoagulation (Eliquis); Hypomagnesemia; Ejection fraction < 50%; Encounter for adjustment or management of infusion pump; Porokeratosis; Moderate protein-calorie malnutrition ([place]); Thrombocytopenia ([place]); Anemia; Nausea without vomiting; Xerostomia; Atrial fibrillation ([place]); 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 Logan Perez was last seen on 07/31/2017. His primarily concern today is the Back Pain (lower)  Pain Assessment: Location: Lower Back Radiating: Denies Onset: More than a month ago Duration: Chronic pain Quality: Dull Severity: 1 /10 (self-reported pain score)  Note: Reported level is compatible with observation.                               Timing: Constant Modifying factors: Meds  The patient comes in today clinics today status post intrathecal pump revision and replacement of battery/reservoir. There were no intraoperative complications and so far his postoperative period has been uneventful. Today we will be removing his staples, checking the wound, doing a pump readout, and we will then follow up with his next refill. The pump was filled with his usual medication intraoperatively and therefore he should have enough medicine to last until his next scheduled refill. An antibacterial device was implanted under the normal pump which will continue to deliver antibiotics on a continuous basis until it runs out, after which it will this solve itself.  Further details on both, my assessment(s), as well as the proposed treatment plan, please see below.  Laboratory Chemistry  Inflammation Markers (CRP: Acute Phase) (ESR: Chronic Phase) Lab Results  Component Value Date   ESRSEDRATE 64 (H) 08/23/2016                 Rheumatology Markers No results found for: RF, ANA, LABURIC, URICUR, LYMEIGGIGMAB, LYMEABIGMQN              Renal Function Markers Lab Results  Component Value Date   BUN 20  09/06/2017   CREATININE 1.31 (H) 09/06/2017   GFRAA >60 09/06/2017   GFRNONAA 53 (L) 09/06/2017                 Hepatic Function Markers Lab Results  Component Value Date   AST 24 09/06/2017   ALT 14 (L) 09/06/2017   ALBUMIN 4.0 09/06/2017   ALKPHOS 51 09/06/2017                 Electrolytes Lab Results  Component Value Date   NA 136 09/06/2017   K 4.4 09/06/2017   CL 107 09/06/2017   CALCIUM 9.1 09/06/2017   MG 1.9 09/06/2017                 Neuropathy Markers Lab Results  Component Value Date   VITAMINB12 746 08/30/2016   FOLATE 19.2 08/30/2016                 Bone Pathology Markers No results found for: [place], [id number], JH4174YC1, [id number], 25OHVITD1, 25OHVITD2, 25OHVITD3, TESTOFREE, TESTOSTERONE               Coagulation Parameters Lab Results  Component Value Date   INR 1.0 08/27/2017   LABPROT 10.7 08/27/2017   APTT 25 02/09/2016   PLT 174 09/06/2017                 Cardiovascular Markers Lab Results  Component Value Date   TROPONINI < 0.02 05/26/2013   HGB 12.2 (L) 09/06/2017   HCT 35.6 (L) 09/06/2017                 CA Markers No results found for: CEA, CA125, LABCA2               Note: Lab results reviewed.  Recent Diagnostic Imaging Review  Spine Imaging: CT-Guided Biopsy:  Results for orders placed during the hospital encounter of 02/09/16  CT Biopsy   Narrative INDICATION: 71 year old male with a history of relapsed multiple myeloma.  EXAM: CT BIOPSY  MEDICATIONS: None.  ANESTHESIA/SEDATION: Moderate (conscious) sedation was employed during this procedure. A total of Versed 3.0 mg and Fentanyl 100 mcg was administered intravenously.  Moderate Sedation Time: 17 minutes. The patient's level of consciousness and vital signs were monitored continuously by radiology nursing throughout the procedure under my direct supervision.  FLUOROSCOPY TIME:  CT  COMPLICATIONS: None  PROCEDURE: The procedure risks, benefits, and  alternatives were explained to the patient. Questions regarding the procedure were encouraged and answered. The patient understands and consents to the procedure.  Scout CT of the pelvis was performed for surgical planning purposes.  The posterior pelvis was prepped with Betadinein a sterile fashion, and a sterile drape was applied covering the operative field. A sterile gown and sterile gloves were used for the procedure. Local anesthesia was provided with 1% Lidocaine.  We targeted the right posterior iliac bone for biopsy. The skin and subcutaneous tissues were infiltrated with 1% lidocaine without epinephrine. A small stab incision was made with an 11 blade scalpel, and an 11 gauge Murphy needle was advanced with CT guidance to the posterior cortex. Manual forced was used to advance the needle through the posterior cortex and the stylet was removed. A bone marrow aspirate was retrieved and passed to a cytotechnologist in the room. The Murphy needle was then advanced without the stylet for a core biopsy. The core biopsy was retrieved and also passed to a cytotechnologist.  Manual pressure was used for hemostasis and a  sterile dressing was placed.  No complications were encountered no significant blood loss was encountered.  Patient tolerated the procedure well and remained hemodynamically stable throughout.  IMPRESSION: Status post CT-guided bone marrow biopsy, with tissue specimen sent to pathology for complete histopathologic analysis  Signed,  Dulcy Fanny. Earleen Newport, DO  [facility]  [facility]   Electronically Signed   By: Corrie Mckusick D.O.   On: 02/09/2016 14:30    Complexity Note: Imaging results reviewed. Results shared with Mr. Castille, using Layman's terms.                         Meds   Current Outpatient Medications:  .  acetaminophen (TYLENOL) 325 MG tablet, Take 650 mg every 6 (six) hours as needed by  mouth for mild pain or headache. , Disp: , Rfl:  .  ALPRAZolam (XANAX) 0.25 MG tablet, TAKE 1 TABLET BY MOUTH EVERY NIGHT AT BEDTIME, AS NEEDED FOR ANXIETY (Patient taking differently: Take 0.25 mg at bedtime as needed by mouth for anxiety. ), Disp: 30 tablet, Rfl: 1 .  baclofen (LIORESAL) 10 MG tablet, Take 1 tablet (10 mg total) by mouth at bedtime., Disp: 30 tablet, Rfl: 5 .  Calcium Carb-Cholecalciferol (CALCIUM-VITAMIN D) 500-400 MG-UNIT TABS, Take 1 tablet daily by mouth., Disp: , Rfl:  .  cetirizine (ZYRTEC) 10 MG tablet, Take 10 mg by mouth daily as needed for allergies. , Disp: , Rfl:  .  Cholecalciferol (VITAMIN D3) 2000 units capsule, Take 2,000 Units daily by mouth. , Disp: , Rfl:  .  Daratumumab (DARZALEX IV), Inject 1,200 mg every 30 (thirty) days into the vein. , Disp: , Rfl:  .  dexamethasone (DECADRON) 4 MG tablet, Take 4 tabs PO 1 hr prior to infusion, then take 1 tab on day 2 and day 3, Disp: 30 tablet, Rfl: 1 .  diltiazem (CARDIZEM CD) 120 MG 24 hr capsule, Take 120 mg daily by mouth. , Disp: , Rfl:  .  diltiazem (CARDIZEM) 60 MG tablet, Take 60 mg daily as needed by mouth (for increased heart rate > 140). , Disp: , Rfl:  .  diphenhydrAMINE (BENADRYL) 25 MG tablet, Take 25 mg every 6 (six) hours as needed by mouth for itching or allergies. , Disp: , Rfl:  .  ELIQUIS 5 MG TABS tablet, TAKE 1 TABLET(5 MG) BY MOUTH TWICE DAILY, Disp: 60 tablet, Rfl: 0 .  ENSURE (ENSURE), Take 237 mLs by mouth daily., Disp: , Rfl:  .  fluticasone (FLONASE) 50 MCG/ACT nasal spray, Place 1 spray into the nose daily as needed for allergies. , Disp: , Rfl:  .  Hypromellose (ARTIFICIAL TEARS OP), Place 1 drop as needed into both eyes (for dry eyes)., Disp: , Rfl:  .  ibuprofen (ADVIL,MOTRIN) 200 MG tablet, Take 400 mg every 6 (six) hours as needed by mouth for headache or moderate pain., Disp: , Rfl:  .  loperamide (IMODIUM) 2 MG capsule, Take 4 mg as needed by mouth for diarrhea or loose stools. , Disp: ,  Rfl:  .  lovastatin (MEVACOR) 20 MG tablet, Take 20 mg by mouth every evening. , Disp: , Rfl:  .  montelukast (SINGULAIR) 10 MG tablet, TAKE 1 TABLET BY MOUTH ONCE A DAY AS DIRECTED. (Patient taking differently: Take 68ms daily the day before the infusion, the day of, and the 2 days after infusion), Disp: 30 tablet, Rfl: 0 .  Multiple Vitamins-Iron (MULTIVITAMIN/IRON PO), Take 1 tablet  daily by mouth., Disp: , Rfl:  .  naproxen sodium (ANAPROX) 220 MG tablet, Take 220 mg daily as needed by mouth (for pain or headaches). , Disp: , Rfl:  .  omeprazole (PRILOSEC) 20 MG capsule, Take 20 mg by mouth daily. , Disp: , Rfl:  .  ondansetron (ZOFRAN) 8 MG tablet, Take 8 mg every 8 (eight) hours as needed by mouth for nausea or vomiting., Disp: , Rfl:  .  potassium chloride SA (K-DUR,KLOR-CON) 20 MEQ tablet, TAKE 1 TABLET(20 MEQ) BY MOUTH DAILY, Disp: 90 tablet, Rfl: 0 .  ranitidine (ZANTAC) 150 MG tablet, Take 150 mg by mouth at bedtime. , Disp: , Rfl:  .  tamsulosin (FLOMAX) 0.4 MG CAPS capsule, Take 1 capsule (0.4 mg total) by mouth daily., Disp: 90 capsule, Rfl: 4 .  valACYclovir (VALTREX) 500 MG tablet, TAKE 1 TABLET BY MOUTH DAILY, Disp: 30 tablet, Rfl: 0 .  vitamin B-12 (CYANOCOBALAMIN) 1000 MCG tablet, Take 1,000 mcg daily by mouth. , Disp: , Rfl:  .  PAIN MANAGEMENT IT PUMP REFILL, 1 each once for 1 dose by Intrathecal route. Medication: PF Fentanyl 1,500.0 mcg/ml PF Bupivicaine 30.0 mg/ml PF Clonidine 300.0 mcg/ml Total Volume: 40 ml Needed by 09-04-17 @ 1000  Please deliver to the OR, Disp: 1 each, Rfl: 0  ROS  Constitutional: Denies any fever or chills Gastrointestinal: No reported hemesis, hematochezia, vomiting, or acute GI distress Musculoskeletal: Denies any acute onset joint swelling, redness, loss of ROM, or weakness Neurological: No reported episodes of acute onset apraxia, aphasia, dysarthria, agnosia, amnesia, paralysis, loss of coordination, or loss of consciousness  Allergies  Mr.  Perez is allergic to azithromycin; bee pollen; pollen extract; zometa [zoledronic acid]; and rivaroxaban.  [place]  Drug: Logan Perez  reports that he does not use drugs. Alcohol:  reports that he does not drink alcohol. Tobacco:  reports that he quit smoking about 41 years ago. His smoking use included cigarettes. He has a 30.00 pack-year smoking history. he has never used smokeless tobacco. Medical:  has a past medical history of Anxiety, Atrial fibrillation ([place]), BPH (benign prostatic hyperplasia), Complication of anesthesia, Compression fracture of lumbar vertebra ([place]), Difficulty voiding, Dysrhythmia, Elevated PSA, GERD (gastroesophageal reflux disease), Hearing aid worn, History of kidney stones, HLD (hyperlipidemia), HOH (hard of hearing), Hypertension, Multiple myeloma ([place]), Neuropathy, Pain, Palpitations, Pneumonia, Pulmonary embolism ([place]), Sepsis ([place]), and Stroke ([place]). Surgical: Logan Perez  has a past surgical history that includes Knee arthroscopy (Left, 1992); Pain pump implantation (2012); stem cell implant (2008); Cataract extraction w/PHACO (Right, 05/02/2016); Cataract extraction w/PHACO (Left, 05/16/2016); Limbal stem cell transplant; PORTA CATH INSERTION (N/A, 12/04/2016); Colonoscopy with propofol (N/A, 03/22/2017); Esophagogastroduodenoscopy (egd) with propofol (03/22/2017); Eye surgery; Back surgery (1994); and Pain pump implantation (N/A, 09/04/2017). Family: family history includes Cancer in his father; Kidney disease in his sister; Stroke in his mother and unknown relative.  Constitutional Exam  General appearance: Well nourished, well developed, and well hydrated. In no apparent acute distress Vitals:   09/13/17 1238  BP: 109/67  Pulse: 98  Temp: 98.6 F (37 C)  SpO2: 100%  Weight: 165 lb (74.8 kg)  Height: '5\' 7"'  (1.702 m)   BMI Assessment: Estimated body mass index is 25.84 kg/m as calculated from the following:   Height as of this encounter: '5\' 7"'  (1.702 m).    Weight as of this encounter: 165 lb (74.8 kg).  BMI interpretation table: BMI level Category Range association with higher incidence of chronic pain  <18 kg/m2  Underweight   18.5-24.9 kg/m2 Ideal body weight   25-29.9 kg/m2 Overweight Increased incidence by 20%  30-34.9 kg/m2 Obese (Class I) Increased incidence by 68%  35-39.9 kg/m2 Severe obesity (Class II) Increased incidence by 136%  >40 kg/m2 Extreme obesity (Class III) Increased incidence by 254%   BMI Readings from Last 4 Encounters:  09/13/17 25.84 kg/m  09/06/17 25.95 kg/m  08/27/17 25.84 kg/m  08/27/17 25.84 kg/m   Wt Readings from Last 4 Encounters:  09/13/17 165 lb (74.8 kg)  09/06/17 165 lb 11.2 oz (75.2 kg)  08/27/17 165 lb (74.8 kg)  08/27/17 165 lb (74.8 kg)  Psych/Mental status: Alert, oriented x 3 (person, place, & time)       Eyes: PERLA Respiratory: No evidence of acute respiratory distress  Cervical Spine Area Exam  Skin & Axial Inspection: No masses, redness, edema, swelling, or associated skin lesions Alignment: Symmetrical Functional ROM: Unrestricted ROM      Stability: No instability detected Muscle Tone/Strength: Functionally intact. No obvious neuro-muscular anomalies detected. Sensory (Neurological): Unimpaired Palpation: No palpable anomalies              Upper Extremity (UE) Exam    Side: Right upper extremity  Side: Left upper extremity  Skin & Extremity Inspection: Skin color, temperature, and hair growth are WNL. No peripheral edema or cyanosis. No masses, redness, swelling, asymmetry, or associated skin lesions. No contractures.  Skin & Extremity Inspection: Skin color, temperature, and hair growth are WNL. No peripheral edema or cyanosis. No masses, redness, swelling, asymmetry, or associated skin lesions. No contractures.  Functional ROM: Unrestricted ROM          Functional ROM: Unrestricted ROM          Muscle Tone/Strength: Functionally intact. No obvious neuro-muscular anomalies  detected.  Muscle Tone/Strength: Functionally intact. No obvious neuro-muscular anomalies detected.  Sensory (Neurological): Unimpaired          Sensory (Neurological): Unimpaired          Palpation: No palpable anomalies              Palpation: No palpable anomalies              Specialized Test(s): Deferred         Specialized Test(s): Deferred          Thoracic Spine Area Exam  Skin & Axial Inspection: No masses, redness, or swelling Alignment: Symmetrical Functional ROM: Unrestricted ROM Stability: No instability detected Muscle Tone/Strength: Functionally intact. No obvious neuro-muscular anomalies detected. Sensory (Neurological): Unimpaired Muscle strength & Tone: No palpable anomalies  Lumbar Spine Area Exam  Skin & Axial Inspection: The implant wound on the right lower quadrant and seems to be healing well. He does have some area of redness in the last 3 staples. All were removed. No discharge. He has been instructed to keep an eye on it. The area was cleaned today and the bandages replaced. The staples were removed. Steri-Strips were placed for added support. The patient was instructed to clean the area twice a day with alcohol and to shower daily and clean it with running water and soap. He was also instructed to take a picture of the area that is red, using his cell phone, and to repeated every other day to see if it's improving or worsening. He was instructed to give me a call if he starts having any problems with it such as fever, redness, increase pain, or discharge. Alignment: Symmetrical Functional ROM: Unrestricted ROM  Stability: No instability detected Muscle Tone/Strength: Functionally intact. No obvious neuro-muscular anomalies detected. Sensory (Neurological): Unimpaired Palpation: No palpable anomalies       Provocative Tests: Lumbar Hyperextension and rotation test: evaluation deferred today       Lumbar Lateral bending test: evaluation deferred today        Patrick's Maneuver: evaluation deferred today                    Gait & Posture Assessment  Ambulation: Unassisted Gait: Relatively normal for age and body habitus Posture: WNL   Lower Extremity Exam    Side: Right lower extremity  Side: Left lower extremity  Skin & Extremity Inspection: Skin color, temperature, and hair growth are WNL. No peripheral edema or cyanosis. No masses, redness, swelling, asymmetry, or associated skin lesions. No contractures.  Skin & Extremity Inspection: Skin color, temperature, and hair growth are WNL. No peripheral edema or cyanosis. No masses, redness, swelling, asymmetry, or associated skin lesions. No contractures.  Functional ROM: Unrestricted ROM          Functional ROM: Unrestricted ROM          Muscle Tone/Strength: Functionally intact. No obvious neuro-muscular anomalies detected.  Muscle Tone/Strength: Functionally intact. No obvious neuro-muscular anomalies detected.  Sensory (Neurological): Unimpaired  Sensory (Neurological): Unimpaired  Palpation: No palpable anomalies  Palpation: No palpable anomalies   Assessment  Primary Diagnosis & Pertinent Problem List: The primary encounter diagnosis was Chronic pain syndrome. Diagnoses of Presence of implanted infusion pump (Medtronic, programmable, intrathecal pump), Encounter for interrogation of infusion pump, Chronic lumbar radicular pain, and Cancer associated pain were also pertinent to this visit.  Status Diagnosis  Controlled Controlled Controlled 1. Chronic pain syndrome   2. Presence of implanted infusion pump (Medtronic, programmable, intrathecal pump)   3. Encounter for interrogation of infusion pump   4. Chronic lumbar radicular pain   5. Cancer associated pain     Problems updated and reviewed during this visit: No problems updated. Plan of Care  Pharmacotherapy (Medications Ordered): No orders of the defined types were placed in this encounter. This SmartLink is deprecated. Use  AVSMEDLIST instead to display the medication list for a patient. Medications administered today: Logan Baton "Rick" had no medications administered during this visit.   Procedure Orders     PUMP REFILL Lab Orders  No laboratory test(s) ordered today   Imaging Orders  No imaging studies ordered today   Referral Orders  No referral(s) requested today    Interventional management options: Planned, scheduled, and/or pending:   Continue with intrathecal pump refills as per pump programming. We do not plan to make any changes and the concentration or dose.    Considering:   Palliative intrathecal pump refills as per program.    Palliative PRN treatment(s):   None at this time   Provider-requested follow-up: Return for Pump Refill (as per pump program).  Future Appointments  Date Time Provider [place]  10/04/2017  8:30 AM [facility] None  10/04/2017  9:00 AM Lequita Asal, MD [facility] None  10/04/2017  9:30 AM [facility] INFUSION CHAIR 9 [facility] None  12/11/2017 11:00 AM Milinda Pointer, MD [facility] None  05/02/2018  8:30 AM McGowan, Hunt Oris, PA-C [facility] None   Primary Care Physician: Sofie Hartigan, MD Location: [facility] Note by: Gaspar Cola, MD Date: 09/13/2017; Time: 2:05 PM

## 2017-09-13 ENCOUNTER — Other Ambulatory Visit: Payer: Self-pay

## 2017-09-13 ENCOUNTER — Encounter: Payer: Self-pay | Admitting: Pain Medicine

## 2017-09-13 ENCOUNTER — Ambulatory Visit: Payer: Medicare Other | Attending: Pain Medicine | Admitting: Pain Medicine

## 2017-09-13 ENCOUNTER — Other Ambulatory Visit: Payer: Self-pay | Admitting: Hematology and Oncology

## 2017-09-13 VITALS — BP 109/67 | HR 98 | Temp 98.6°F | Ht 67.0 in | Wt 165.0 lb

## 2017-09-13 DIAGNOSIS — M545 Low back pain: Secondary | ICD-10-CM | POA: Insufficient documentation

## 2017-09-13 DIAGNOSIS — D649 Anemia, unspecified: Secondary | ICD-10-CM | POA: Insufficient documentation

## 2017-09-13 DIAGNOSIS — G893 Neoplasm related pain (acute) (chronic): Secondary | ICD-10-CM

## 2017-09-13 DIAGNOSIS — M5416 Radiculopathy, lumbar region: Secondary | ICD-10-CM

## 2017-09-13 DIAGNOSIS — Z451 Encounter for adjustment and management of infusion pump: Secondary | ICD-10-CM

## 2017-09-13 DIAGNOSIS — N401 Enlarged prostate with lower urinary tract symptoms: Secondary | ICD-10-CM | POA: Insufficient documentation

## 2017-09-13 DIAGNOSIS — Z79899 Other long term (current) drug therapy: Secondary | ICD-10-CM | POA: Diagnosis not present

## 2017-09-13 DIAGNOSIS — F419 Anxiety disorder, unspecified: Secondary | ICD-10-CM | POA: Insufficient documentation

## 2017-09-13 DIAGNOSIS — N138 Other obstructive and reflux uropathy: Secondary | ICD-10-CM | POA: Diagnosis not present

## 2017-09-13 DIAGNOSIS — Z95828 Presence of other vascular implants and grafts: Secondary | ICD-10-CM

## 2017-09-13 DIAGNOSIS — Z79891 Long term (current) use of opiate analgesic: Secondary | ICD-10-CM | POA: Insufficient documentation

## 2017-09-13 DIAGNOSIS — M4854XA Collapsed vertebra, not elsewhere classified, thoracic region, initial encounter for fracture: Secondary | ICD-10-CM | POA: Insufficient documentation

## 2017-09-13 DIAGNOSIS — M7918 Myalgia, other site: Secondary | ICD-10-CM | POA: Diagnosis not present

## 2017-09-13 DIAGNOSIS — I10 Essential (primary) hypertension: Secondary | ICD-10-CM | POA: Diagnosis not present

## 2017-09-13 DIAGNOSIS — G894 Chronic pain syndrome: Secondary | ICD-10-CM

## 2017-09-13 DIAGNOSIS — G8929 Other chronic pain: Secondary | ICD-10-CM

## 2017-09-14 ENCOUNTER — Telehealth: Payer: Self-pay

## 2017-09-18 DIAGNOSIS — Z85828 Personal history of other malignant neoplasm of skin: Secondary | ICD-10-CM | POA: Insufficient documentation

## 2017-09-20 NOTE — Telephone Encounter (Signed)
error 

## 2017-09-21 ENCOUNTER — Telehealth: Payer: Self-pay | Admitting: *Deleted

## 2017-09-21 ENCOUNTER — Other Ambulatory Visit: Payer: Self-pay | Admitting: Hematology and Oncology

## 2017-09-21 DIAGNOSIS — C9001 Multiple myeloma in remission: Secondary | ICD-10-CM

## 2017-09-21 MED ORDER — ALPRAZOLAM 0.25 MG PO TABS: ORAL_TABLET | ORAL | 1 refills | Status: DC

## 2017-09-21 NOTE — Telephone Encounter (Signed)
Patient called to request a medication refill for XANAX. He stated that his pharmacy has faxed three requests for refill. He has one tablet left.

## 2017-09-21 NOTE — Telephone Encounter (Signed)
  Rx written.  Hard copy here.  M

## 2017-09-23 NOTE — Op Note (Signed)
Patient's Name: Logan Perez  MRN: [id number]  Referring Provider: No ref. provider found  DOB: August 26, 1946  PCP: Logan Hartigan, MD  DOS: 09/04/2017  Surgeon: Logan Cola, MD  Service setting: Outpatient Same Day Surgery  Specialty: Interventional Pain Management  Patient type: Established  Location: [facility]  Visit type: Surgical Intervention   Reason for Admission: Ambulatory Surgery for management of Chronic Pain Syndrome (Level of risk: Moderate to High) Admission Date & Time: 09/04/2017 11:57 AM  Operative Report:  Anesthesia:  Date of Surgery: 09/04/2017 Pre-op Diagnosis: END OF LIFE BATTERY,CHRONIC PAIN SYNDROME Surgery: INTRATHECAL PUMP BATTERY CHANGE Revision/Removal of battery-depleted intrathecal programmable pump reservoir. Replacement of  intrathecal programmable pump. Intraoperative analysis and programming of the intrathecal pump. Intraoperative reservoir refill. Post-op Diagnosis: END OF LIFE BATTERY,CHRONIC PAIN SYNDROME  Type: Choice by Anesthesia team: Anesthesiologist: Logan Barrows, MD CRNA: Logan Salvage, CRNA; Logan Newness, CRNA Route: Intravenous (IV) IV Access: Secured in pre-op area Sedation: Meaningful verbal contact was maintained during the critical portions of the procedure  Indication(s): Analgesia and Anxiolysis  Local/Regional Analgesia: Local anesthetic infiltration by Surgeon Logan Cola, MD) Local Anesthetic: Bupivacaine 0.5% + Lidocaine 1.0% in a 50:50 Mix Indication(s): Analgesia   Position: Supine Target: Intrathecal Pump Reservoir/Battery        Approach: Anterior approach. Area Prepped: Entire Anterior Abdominal Area Prepping solution: Duraprep (Iodine Povacrylex [0.7% available Iodine] and Isopropyl Alcohol, 74% w/w) Region: Abdominal   Intrathecal Implant Details:  Reservoir (Battery):  Brand: [employer] Location: Abdominal area, lateral to navel Laterality:  Left Model No.:  [id number] SN: [id number] H Volume: 40 mL Program (Dose / Mode): 502.2 mcg/10.026m/100.44/mcg/day Medication: PF-Fentanyl 1500.0 mcg/mL + PF-Bupivacaine 30 mg/mL + PF-Clonidine 300 mcg/mL MRI compatibility: Yes Recharging capability: No   Additional equipment: TYRX Antibiotic envelope (Minocycline/rifampin) (REF:: [id number] LOT:: [id number]  ED:10/08/2017)  Visitor: Vendor - susan Perez - Comments: [employer] rep  Pre-op Assessment:  Mr. JTelfairis a 71y.o. (year old), male patient, seen today for intrathecal pump implant surgery. He  has a past surgical history that includes Knee arthroscopy (Left, 1992); Pain pump implantation (2012); stem cell implant (2008); Cataract extraction w/PHACO (Right, 05/02/2016); Cataract extraction w/PHACO (Left, 05/16/2016); Limbal stem cell transplant; PORTA CATH INSERTION (N/A, 12/04/2016); Colonoscopy with propofol (N/A, 03/22/2017); Esophagogastroduodenoscopy (egd) with propofol (03/22/2017); Eye surgery; Back surgery (1994); and Pain pump implantation (N/A, 09/04/2017).  Initial Vital Signs: Blood pressure 134/77, pulse 68, temperature (!) 97.2 F (36.2 C), temperature source Temporal, resp. rate 20, SpO2 100 %. BMI: Estimated body mass index is 25.84 kg/m as calculated from the following:   Height as of 09/13/17: 5' 7" (1.702 m).   Weight as of 09/13/17: 165 lb (74.8 kg).  Risk Assessment: Allergies: Reviewed. He is allergic to azithromycin; bee pollen; pollen extract; zometa [zoledronic acid]; and rivaroxaban.  Allergy Precautions: None required Coagulopathies: Reviewed. None identified.  Blood-thinner therapy: None at this time Active Infection(s): Reviewed. None identified. Mr. JCastigliais afebrile  Site Confirmation: Mr. JKurtzmanwas asked to confirm the procedure and laterality before marking the site, which he did.  Procedure checklist: Completed  Consent: Before the procedure and under the influence of no sedative(s), amnesic(s), or anxiolytics,  the patient was informed of the treatment options, risks and possible complications. To fulfill our ethical and legal obligations, as recommended by the American Medical Association's Code of Ethics, I have informed the patient of my clinical impression; the nature and purpose of the treatment or procedure; the risks,  benefits, and possible complications of the intervention; the alternatives, including doing nothing; the risk(s) and benefit(s) of the alternative treatment(s) or procedure(s); and the risk(s) and benefit(s) of doing nothing.  Mr. Logan Perez was provided with information about the general risks and possible complications associated with most interventional procedures. These include, but are not limited to: failure to achieve desired goals, infection, bleeding, organ or nerve damage, allergic reactions, paralysis, and/or death.  In addition, he was informed of those risks and possible complications associated to this particular procedure, which include, but are not limited to: damage to the implant; mechanical failure of the implant; failure to decrease pain; local, systemic, or serious CNS infections, intraspinal abscess with possible cord compression and paralysis, or life-threatening such as meningitis; intrathecal and/or epidural bleeding with formation of hematoma with possible spinal cord compression and permanent paralysis; organ damage; nerve injury or damage with subsequent sensory, motor, and/or autonomic system dysfunction, resulting in transient or permanent pain, numbness, and/or weakness of one or several areas of the body; allergic reactions, either minor or major life-threatening, such as anaphylactic or anaphylactoid reactions. He was also informed of the possibility of unforseen future implant recall due to currently unknown problems. He was reminded that mechanical all devices fail with time, requiring further interventional therapies that may, or may not, be performed by  me.  Furthermore, Mr. Logan Perez was informed of those risks and complications associated with the medications. These include, but are not limited to: allergic reactions (i.e.: anaphylactic or anaphylactoid reactions; or chemical meningitis); arrhythmia;  Hypotension/hypertension; cardiovascular collapse; respiratory depression and/or shortness of breath; swelling or edema; medication-induced neural toxicity; intrathecal granulomas; particulate matter embolism and blood vessel occlusion with resultant organ, and/or nervous system infarction and permanent paralysis; and/or unpredictable catastrophic complication or reaction leading to permanent damage or death. He was also informed of the possible off-label use of intrathecal medications for analgesic purposes.  Finally, he was informed that Medicine is not an exact science; therefore, there is also the possibility of unforeseen or unpredictable risks and/or possible complications that may result in a catastrophic outcome. The patient indicated having understood very clearly. We have given the patient no guarantees and we have made no promises. Enough time was given to the patient to ask questions, all of which were answered to the patient's satisfaction. Mr. Marcella has indicated that he wanted to continue with the procedure.  Attestation: I, the ordering provider, attest that I have discussed with the patient the benefits, risks, side-effects, alternatives, likelihood of achieving goals, and potential problems during recovery for the procedure that I have provided informed consent.  Date: 09/04/2017  Pre-Procedure Preparation:  Monitoring: As per Anesthesia protocol. Respiration, ETCO2, SpO2, BP, heart rate and rhythm monitor placed and checked for adequate function  Safety Precautions: Patient was assessed for positional comfort and pressure points before starting the procedure.  Time-out: I initiated and conducted the "Time-out" before starting the  procedure, as per protocol. The patient was asked to participate by confirming the accuracy of the "Time Out" information. Verification of the correct person, site, and procedure were performed and confirmed by me, the nursing staff, and the patient. "Time-out" conducted as per Joint Commission's Universal Protocol (UP.01.01.01).  Description of Procedure Process:   Times:  Scheduled start: 0981 Procedure:  1:00 PM Incision: 1 Hr 18 Min 44 Sec  Description of procedure: The procedure site was prepped using a broad-spectrum topical antiseptic. The area was then draped in the usual and standard manner. "Time-out" was performed as  per JC Universal Protocol (UP.01.01.01). Pump replacement implant: The old pump was identified and the anchors removed. Care was taken not to damage the catheter. Meanwhile, the new pump was emptied from its original content and refilled with the desired medication. The new pump was then interrogated, programmed, and primed. The catheter was then disconnected from the old pump and immediately connected to the new one, after having aspirated and confirmed that it was still patent. The new pump was inserted into the pocket after having cleaned it. The excess catheter was place behind the pump and the pump was secured to the abdominal wall using 2-0 non-absorbable sutures. A suture was placed in all four corners of the pump, so as to prevent migration or flipping of the pump. The wound was checked for adequate hemostasis, irrigated, and cleaned prior to closure. The wound was approximated using 2-0 Vicryl. Great care was taken, so as to avoid puncturing the intrathecal catheter. The skin was closed using surgical staples. The wound was covered with a sterile gauze, for absorption, and a bio-occlusive dressing. An abdominal binder was placed to minimize fluid accumulation or development of a hematoma. The patient was then transferred to the post-anesthesia recovery unit, where final  analysis and programming of the device was done. The patient tolerated the entire procedure well. A repeat set of vitals were taken after the procedure and the patient was kept under observation until discharge criteria was met. The patient was provided with discharge instructions, including a section on how to identify potential problems. Should any problems arise concerning this procedure, the patient was given instructions to immediately contact us, without hesitation. The [employer]'s representative and I, both provided the patient with our Business cards containing our contact telephone numbers, and instructed the patient to contact either one of [place], at any time, should there be any problems or questions. In any case, we plan to contact the patient by telephone for a follow-up status report regarding this interventional procedure.  Incision: Incision (Closed) 09/04/17 Abdomen-Dressing Type: (large tegederm  and telfa pad) EBL: 10 mL Complications: * No complications entered in OR log *  OR Staff:  Circulator: Joanie Coddington, RN Scrub Person: Jaynee Eagles  Safety Precautions: Informed consent obtained. Patient allergies reviewed. "Time-out" was performed as per JC Universal Protocol (UP.01.01.01). Strict sterile technique kept at all times. Fluoroscopy guidance used for placement accuracy and confirmation. Continuous Anesthesia monitoring maintained throughout the entire case. Pressure points and patient comfort assessed during initial patient positioning. Meaningful verbal contact maintained at all times during the critical portions of the procedure. Aspiration looking for blood return was conducted prior to all injections. At no point did we inject any substances, as a needle was being advanced. No attempts were made at seeking any paresthesias. Safe injection practices and needle disposal techniques used. Medications properly checked for expiration dates. SDV (single dose vial) medications  used. Adequate hemostasis attained before wound closure.  Vitals:   09/04/17 1511 09/04/17 1513 09/04/17 1522 09/04/17 1557  BP: 133/81 132/85 (!) 143/102 134/77  Pulse:  62 67 68  Resp: _0 Temp:  (!) 97.1 F (36.2 C) (!) 97.2 F (36.2 C)   TempSrc:   Temporal   SpO2:  100% 100% 100%   Materials:   Procedure Equipment Tray:  Type: Implant Tray (See Hospital logs for specific details)  Procedural Needle(s):  Type: Standard bevel needle Gauge: 25-G Length: 1.5-in  Medication(s): Medication Name Total Dose  bupivacaine (MARCAINE) 0.5 % (  with pres) injection 10 mL  lidocaine (XYLOCAINE) 1 % (with pres) injection 10 mL  ceFAZolin (ANCEF) 1-4 GM/50ML-% IVPB Cannot be calculated  ceFAZolin (ANCEF) IVPB 1 g/50 mL premix 1 g  lactated ringers infusion 80 mL    Imaging Guidance (Spinal):  Type of Imaging Technique: Fluoroscopy Guidance (Spinal) Indication(s): Assistance in needle guidance and placement for procedures requiring needle placement in or near specific anatomical locations not easily accessible without such assistance. Exposure Time: Please see nurses notes. Contrast: Before injecting any contrast, we confirmed that the patient did not have an allergy to iodine, shellfish, or radiological contrast. Once satisfactory needle placement was completed at the desired level, radiological contrast was injected. Contrast injected under live fluoroscopy. No contrast complications. See chart for type and volume of contrast used. Fluoroscopic Guidance: I was personally present during the use of fluoroscopy. "Tunnel Vision Technique" used to obtain the best possible view of the target area. Parallax error corrected before commencing the procedure. "Direction-depth-direction" technique used to introduce the needle under continuous pulsed fluoroscopy. Once target was reached, antero-posterior, oblique, and lateral fluoroscopic projection used confirm needle placement in all planes.  Images permanently stored in EMR. Interpretation: I personally interpreted the imaging intraoperatively. Adequate needle placement confirmed in multiple planes. Appropriate spread of contrast into desired area was observed. No evidence of afferent or efferent intravascular uptake. No intrathecal or subarachnoid spread observed. Permanent images saved into the patient's record.  Antibiotic Prophylaxis:  Indication(s): Surgical Implant Prophylaxis Antibiotic given: Cefazolin/Ancef 1 g IV slowly, 30 min pre-op  Post-operative Assessment:  EBL: 10 mL Complications: No immediate post-treatment complications observed by team, or reported by patient. Note: The patient tolerated the entire procedure well. A repeat set of vitals were taken after the procedure and the patient was kept under observation following institutional policy, for this type of procedure. Post-procedural neurological assessment was performed, showing return to baseline, prior to discharge. The patient was provided with post-procedure discharge instructions, including a section on how to identify potential problems. Should any problems arise concerning this procedure, the patient was given instructions to immediately contact us, at any time, without hesitation. In any case, we plan to contact the patient by telephone for a follow-up status report regarding this interventional procedure. Comments:  No additional relevant information.  Post-op Plan of Care  Disposition: Discharge home under the care of a responsible, capable, adult driver. Return to clinics in 6-10 days for post-procedure evaluation and removal of staples.  Discharge Date & Time: 09/04/2017  4:00 PM  Follow-up:  Future Appointments  Date Time Provider [place]  10/04/2017  8:30 AM [facility] None  10/04/2017  9:00 AM Lequita Asal, MD [facility] None  10/04/2017  9:30 AM [facility] INFUSION CHAIR 9 [facility] None  12/11/2017 11:00 AM  Milinda Pointer, MD [facility] None  05/02/2018  8:30 AM [place], Marlowe Kays None   Primary Care Physician: Logan Hartigan, MD Location: [facility]  Note by: Logan Cola, MD Date: 09/04/2017; Time: 7:20 PM

## 2017-09-28 ENCOUNTER — Ambulatory Visit: Admission: RE | Admit: 2017-09-28 | Payer: Medicare Other | Source: Ambulatory Visit

## 2017-09-29 ENCOUNTER — Other Ambulatory Visit: Payer: Self-pay | Admitting: Urology

## 2017-10-03 ENCOUNTER — Other Ambulatory Visit: Payer: Self-pay | Admitting: Hematology and Oncology

## 2017-10-03 DIAGNOSIS — C9001 Multiple myeloma in remission: Secondary | ICD-10-CM

## 2017-10-03 NOTE — Progress Notes (Signed)
[facility] day:  10/04/17  Chief Complaint: Logan Perez is a 71 y.o. 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   HPI: The patient was last seen in the medical oncology clinic on 09/06/2017.  At that time, he felt "good".  Patient complained of some "soreness" following the batteries being replaced in his implanted pain pump. He denied any bruising or bleeding. He had no B symptoms or interval infections. Weight remained stable.   He received 12 month vaccines on 08/28/2017 at [facility] with Dr. Ok Edwards. He had plans to receive the shingles vaccine soon. WBC was 11,200 with an [place] of 9800. Hemoglobin was 12.2, hematocrit 35.6, platelets 174,000. Renal function was stable; BUN 20 with a creatinine of 1.31. Magnesium was normal at 1.9. SPEP revealed no monoclonal protein. Patient received cycle #9 daratumumab.    During the interim, he has done well.  He has had interval Moh's surgery.  He denies any fevers or infections.  He denies bone pain.   Past Medical History:  Diagnosis Date  . Anxiety   . Atrial fibrillation ([place])   . BPH (benign prostatic hyperplasia)   . Complication of anesthesia    BAD HEADACHE NIGHT OF FIRST CATARACT  . Compression fracture of lumbar vertebra ([place])   . Difficulty voiding   . Dysrhythmia    A FIB  . Elevated PSA   . GERD (gastroesophageal reflux disease)   . Hearing aid worn    bilateral  . History of kidney stones   . HLD (hyperlipidemia)   . HOH (hard of hearing)   . Hypertension   . Multiple myeloma ([place])   . Neuropathy    feet. 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  . Pain    BACK  . Palpitations   . Pneumonia   . Pulmonary embolism ([place])   . Sepsis ([place])   . Stroke [facility])    TIA, detected on CT scan. pt was unaware    Past Surgical History:  Procedure  Laterality Date  . BACK SURGERY  1994  . CATARACT EXTRACTION W/PHACO Right 05/02/2016   Procedure: CATARACT EXTRACTION PHACO AND INTRAOCULAR LENS PLACEMENT (IOC);  Surgeon: Birder Robson, MD;  Location: [facility] ORS;  Service: Ophthalmology;  Laterality: Right;  [place] 1.06 AP% 20.6 CDE 13.70 FLUID PACK LOT # [id number]  . CATARACT EXTRACTION W/PHACO Left 05/16/2016   Procedure: CATARACT EXTRACTION PHACO AND INTRAOCULAR LENS PLACEMENT (IOC);  Surgeon: Birder Robson, MD;  Location: [facility] ORS;  Service: Ophthalmology;  Laterality: Left;  [place] 01:43 AP% 19.8 CDE 20.45 FLUID PACK LOT #[id number] H  . COLONOSCOPY WITH PROPOFOL N/A 03/22/2017   Procedure: COLONOSCOPY WITH PROPOFOL;  Surgeon: Lucilla Lame, MD;  Location: [place];  Service: Endoscopy;  Laterality: N/A;  has port  . ESOPHAGOGASTRODUODENOSCOPY (EGD) WITH PROPOFOL  03/22/2017   Procedure: ESOPHAGOGASTRODUODENOSCOPY (EGD) WITH PROPOFOL;  Surgeon: Lucilla Lame, MD;  Location: [place];  Service: Endoscopy;;  . EYE SURGERY    . KNEE ARTHROSCOPY Left 1992  . LIMBAL STEM CELL TRANSPLANT    . PAIN PUMP IMPLANTATION  2012  . PAIN PUMP IMPLANTATION N/A 09/04/2017   Procedure: INTRATHECAL PUMP BATTERY CHANGE;  Surgeon: Milinda Pointer, MD;  Location: [facility];  Service: Neurosurgery;  Laterality: N/A;  . PORTA CATH INSERTION N/A 12/04/2016   Procedure: Glori Luis Cath Insertion;  Surgeon: Algernon Huxley, MD;  Location: [facility];  Service: Cardiovascular;  Laterality: N/A;  . stem cell implant  2008   [facility]    Family History  Problem Relation Age of Onset  . Cancer Father        throat  . Kidney disease Sister   . Stroke Unknown   . Stroke Mother   . Bladder Cancer Neg Hx   . Prostate cancer Neg Hx   . Kidney cancer Neg Hx     Social History:  reports that he quit smoking about 42 years ago. His smoking use included cigarettes. He has a 30.00 pack-year smoking history. he has never used smokeless tobacco. He reports  that he does not drink alcohol or use drugs.  He has a wire haired dachshund.  He lives in [place].  His wife's name is [name].  The patient is alone today.  Allergies:  Allergies  Allergen Reactions  . Azithromycin Diarrhea and Other (See Comments)    Possible cause of C. Diff  . Bee Pollen Other (See Comments)    Sneezing, watery eyes, runny nose  . Pollen Extract Other (See Comments)    Sneezing, watery eyes, runny nose  . Zometa [Zoledronic Acid] Other (See Comments)    ONG- Osteonecrosis of the jaw   . Rivaroxaban Rash    Current Medications: Current Outpatient Medications  Medication Sig Dispense Refill  . acetaminophen (TYLENOL) 325 MG tablet Take 650 mg every 6 (six) hours as needed by mouth for mild pain or headache.     . ALPRAZolam (XANAX) 0.25 MG tablet TAKE 1 TABLET BY MOUTH EVERY NIGHT AT BEDTIME, AS NEEDED FOR ANXIETY 30 tablet 1  . apixaban (ELIQUIS) 5 MG TABS tablet Take 1 tablet (5 mg total) by mouth 2 (two) times daily. 60 tablet 5  . baclofen (LIORESAL) 10 MG tablet Take 1 tablet (10 mg total) by mouth at bedtime. 30 tablet 5  . Calcium Carb-Cholecalciferol (CALCIUM-VITAMIN D) 500-400 MG-UNIT TABS Take 1 tablet daily by mouth.    . cetirizine (ZYRTEC) 10 MG tablet Take 10 mg by mouth daily as needed for allergies.     . Cholecalciferol (VITAMIN D3) 2000 units capsule Take 2,000 Units daily by mouth.     . Daratumumab (DARZALEX IV) Inject 1,200 mg every 30 (thirty) days into the vein.     [name] dexamethasone (DECADRON) 4 MG tablet Take 4 tabs PO 1 hr prior to infusion, then take 1 tab on day 2 and day 3 30 tablet 1  . diltiazem (CARDIZEM CD) 120 MG 24 hr capsule Take 120 mg daily by mouth.     . diltiazem (CARDIZEM) 60 MG tablet Take 60 mg daily as needed by mouth (for increased heart rate > 140).     [name] diphenhydrAMINE (BENADRYL) 25 MG tablet Take 25 mg every 6 (six) hours as needed by mouth for itching or allergies.     [name] ENSURE (ENSURE) Take 237 mLs by mouth daily.     . fluticasone (FLONASE) 50 MCG/ACT nasal spray Place 1 spray into the nose daily as needed for allergies.     . Hypromellose (ARTIFICIAL TEARS OP) Place 1 drop as needed into both eyes (for dry eyes).    [name] loperamide (IMODIUM) 2 MG capsule Take 4 mg as needed by mouth for diarrhea or loose stools.     . lovastatin (MEVACOR) 20 MG tablet Take 20 mg by mouth every evening.     . montelukast (  SINGULAIR) 10 MG tablet TAKE 1 TABLET BY MOUTH ONCE A DAY AS DIRECTED. (Patient taking differently: Take 90ms daily the day before the infusion, the day of, and the 2 days after infusion) 30 tablet 0  . Multiple Vitamins-Iron (MULTIVITAMIN/IRON PO) Take 1 tablet daily by mouth.    .Marland Kitchenomeprazole (PRILOSEC) 20 MG capsule Take 20 mg by mouth daily.     . ondansetron (ZOFRAN) 8 MG tablet Take 8 mg every 8 (eight) hours as needed by mouth for nausea or vomiting.    .Marland KitchenPAIN MANAGEMENT IT PUMP REFILL 1 each once for 1 dose by Intrathecal route. Medication: PF Fentanyl 1,500.0 mcg/ml PF Bupivicaine 30.0 mg/ml PF Clonidine 300.0 mcg/ml Total Volume: 40 ml Needed by 09-04-17 @ 1000  Please deliver to the OR 1 each 0  . potassium chloride SA (K-DUR,KLOR-CON) 20 MEQ tablet TAKE 1 TABLET(20 MEQ) BY MOUTH DAILY 90 tablet 0  . ranitidine (ZANTAC) 150 MG tablet Take 150 mg by mouth at bedtime.     . tamsulosin (FLOMAX) 0.4 MG CAPS capsule Take 1 capsule (0.4 mg total) by mouth daily. 90 capsule 4  . valACYclovir (VALTREX) 500 MG tablet TAKE 1 TABLET BY MOUTH DAILY 30 tablet 0  . vitamin B-12 (CYANOCOBALAMIN) 1000 MCG tablet Take 1,000 mcg daily by mouth.      No current facility-administered medications for this visit.     Review of Systems:  GENERAL: Feels "good".  No fevers or sweats.  Weight up 1 pound. PERFORMANCE STATUS (ECOG): 1 HEENT:  Cataracts s/p surgery.  No sore throat, mouth sores or tenderness. Lungs:  No shortness of breath.  No cough.  No hemoptysis. Cardiac:  No chest pain, palpitations, orthopnea, or  PND. GI:  Appetite good. Diarrhea, managed (2 Imodium/day).  No vomiting, constipation, melena or hematochezia. GU:  No urgency, frequency, dysuria, or hematuria. Musculoskeletal:  Compression fracture of back on a pain pump. No muscle tenderness. Extremities:  No pain or swelling. Skin:  s/p Moh's surgery.  Fragile skin.  No rashes or skin changes. Neuro:  Little tremor (chronic).  Neuropathy in feet.  Sensitivity in hands.  No headache, weakness, balance or coordination issues. Endocrine:  No diabetes, thyroid issues, hot flashes or night sweats. Psych:  No mood changes, depression or anxiety. Pain:  Chronic back pain (pain well managed with pump). 1/10 "soreness" following pain pump replacement.  Review of systems:  All other systems reviewed and found to be negative.   Physical Exam: Blood pressure 125/76, pulse 90, temperature (!) 97.5 F (36.4 C), temperature source Tympanic, resp. rate 20, height '5\' 7"'  (1.702 m), weight 166 lb 1.6 oz (75.3 kg). GENERAL:  Well developed, well nourished gentleman sitting comfortably in the exam room in no acute distress.  MENTAL STATUS:  Alert and oriented to person, place and time. HEAD:  Short brown hair.  Graying goatee.  Normocephalic, atraumatic, face symmetric, no Cushingoid features. EYES:  Glasses.  Blue eyes s/p cataract surgery.  Pupils equal round and reactive to light and accomodation.  No conjunctivitis or scleral icterus. ENT:  Hearing aide.  Oropharynx clear without lesion.  Hearing aide.  Tongue normal. Mucous membranes moist.  RESPIRATORY:  Clear to auscultation without rales, wheezes or rhonchi. CARDIOVASCULAR:  Regular rate and rhythm without murmur, rub or gallop. ABDOMEN:  RUQ pain pump.  Soft, non-tender, with active bowel sounds, and no hepatosplenomegaly.  No masses. SKIN:  Small bandage across nasal bridge.  No rashes, ulcers or lesions. EXTREMITIES: No edema, skin  discoloration or tenderness.  No palpable cords. LYMPH NODES: No  palpable cervical, supraclavicular, axillary or inguinal adenopathy  NEUROLOGICAL: Intention tremor. PSYCH:  Appropriate.    Appointment on 10/04/2017  Component Date Value Ref Range Status  . Magnesium 10/04/2017 2.0  1.7 - 2.4 mg/dL Final   Performed at [facility], [address], [place], [place] [address]  . Sodium 10/04/2017 139  135 - 145 mmol/L Final  . Potassium 10/04/2017 4.3  3.5 - 5.1 mmol/L Final  . Chloride 10/04/2017 109  101 - 111 mmol/L Final  . CO2 10/04/2017 24  22 - 32 mmol/L Final  . Glucose, Bld 10/04/2017 118* 65 - 99 mg/dL Final  . BUN 10/04/2017 26* 6 - 20 mg/dL Final  . Creatinine, Ser 10/04/2017 1.33* 0.61 - 1.24 mg/dL Final  . Calcium 10/04/2017 9.3  8.9 - 10.3 mg/dL Final  . Total Protein 10/04/2017 6.4* 6.5 - 8.1 g/dL Final  . Albumin 10/04/2017 4.0  3.5 - 5.0 g/dL Final  . AST 10/04/2017 21  15 - 41 U/L Final  . ALT 10/04/2017 14* 17 - 63 U/L Final  . Alkaline Phosphatase 10/04/2017 45  38 - 126 U/L Final  . Total Bilirubin 10/04/2017 0.8  0.3 - 1.2 mg/dL Final  . GFR calc non Af Amer 10/04/2017 52* >60 mL/min Final  . GFR calc Af Amer 10/04/2017 >60  >60 mL/min Final   Comment: (NOTE) The eGFR has been calculated using the CKD EPI equation. This calculation has not been validated in all clinical situations. eGFR's persistently <60 mL/min signify possible Chronic Kidney Disease.   Georgiann Hahn gap 10/04/2017 6  5 - 15 Final   Performed at [facility], [address] [place], [place] [address]  . WBC 10/04/2017 7.3  3.8 - 10.6 K/uL Final  . RBC 10/04/2017 3.33* 4.40 - 5.90 MIL/uL Final  . Hemoglobin 10/04/2017 11.9* 13.0 - 18.0 g/dL Final  . HCT 10/04/2017 34.2* 40.0 - 52.0 % Final  . MCV 10/04/2017 102.7* 80.0 - 100.0 fL Final  . MCH 10/04/2017 35.7* 26.0 - 34.0 pg Final  . MCHC 10/04/2017 34.8  32.0 - 36.0 g/dL Final  . RDW 10/04/2017 14.9* 11.5 - 14.5 % Final  . Platelets 10/04/2017 207  150 - 440 K/uL Final  . Neutrophils  Relative % 10/04/2017 78  % Final  . Neutro Abs 10/04/2017 5.7  1.4 - 6.5 K/uL Final  . Lymphocytes Relative 10/04/2017 17  % Final  . Lymphs Abs 10/04/2017 1.2  1.0 - 3.6 K/uL Final  . Monocytes Relative 10/04/2017 4  % Final  . Monocytes Absolute 10/04/2017 0.3  0.2 - 1.0 K/uL Final  . Eosinophils Relative 10/04/2017 1  % Final  . Eosinophils Absolute 10/04/2017 0.1  0 - 0.7 K/uL Final  . Basophils Relative 10/04/2017 0  % Final  . Basophils Absolute 10/04/2017 0.0  0 - 0.1 K/uL Final   Performed at [facility], [address] [place], [place] [address]    Assessment:  Logan Perez is a 71 y.o. male with stage III mutiple myeloma.  He initially presented with progressive back pain beginning in 12/2006.  MRI revealed "spots and compression fractures".  He began Velcade, thalidomide, and Decadron.  In 08/2007, he underwent high dose chemotherapy and autologous stem cell transplant.  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  He recurred with a rising M-spike (2.7) with repeat M spike (1.7 gm/dl) in 03/2010.  He was initially  treated with Velcade (02/08/2010 - 05/10/2010).  He then began Revlimid (15 mg 3 weeks on/1 week off) and Decadron (40 mg on day 1, 8, 15, 22).  Because of significant side effect with Decadron his dose was decreased to 10 mg once a week in 07/2010.    He was on maintenance Revlimid. Revlimid was initially10 mg 3 weeks on/1 week off. This was changed to 10 mg 2 weeks on/2 weeks off secondary to right nipple tenderness. His dose was increased to 10 mg 3 weeks on/1 week off with Decadron 10 mg a week (on Sundays) and then Revlamid 15 mg 3 weeks on and 1 week off with Decadron on Sundays.  He began Pomalyst 4 mg 3 weeks on/1 week off with Decadron on 08/27/2015.  Over the past year his SPEP has revealed no monoclonal protein (04/21/2015) and 0.5 gm/dL on 09/22/2015 and 10/20/2015. M spike was 0.1 on 02/02/2016, 03/01/2016, 03/29/2016,  04/26/2016, and 05/24/2016.  M spike was 0 on 10/11/2016, 11/28/2016, 02/05/2017, 03/21/2017, 08/02/2017, and 10/04/2017.  Free light chains have been monitored. Kappa free light chains were 18.54 on 11/21/2013, 18.37 on 02/20/2014, 18.93 on 05/22/2014, 32.58 (high; normal ratio 1.27) on 08/21/2014, 51.53 (high; elevated ratio of 2.12) on 11/13/2014, 28.08 (ratio 1.73) on 12/09/2014, 23.71 (ratio 2.17) on 01/18/2015, 92.93 (ratio 9.49) on 04/21/2015, 93.44 (ratio 10.28) on 05/26/2015, 255.45 (ratio of 24.05) on 07/14/2015, 373.89 (ratio 48.31) on 08/04/2015, 474.33 (ratio 70.58) on 08/25/2015, 450.76 (ratio 55.44) on 09/22/2015, 453.4 (ratio 59.89) on 10/20/2015, 58.26 (ratio of > 40.74) on 02/02/2016, 62.67 (ratio of 37.98) on 03/01/2016, 75.7 (ratio > 50.47) on 03/29/2016, 79.7 (ratio 53.13) on 04/26/2016, 108.6 (ratio > 72.4) on 05/24/2016, 8.3 (1.46 ratio) on 10/11/2016, 6.7 (0.81 ratio) on 02/05/2017, 7.8 (1.01 ratio) on 03/21/2017, and 7.6 (ratio .63) on 06/01/2017.  Bone survey on 12/08/2014 was stable.  Bone survey on 10/21/2015 revealed increase conspicuity of subcentimeter lytic lesions in the calvarium.  Bone marrow aspirate and biopsy on 11/04/2015 revealed an atypical monoclonal plasma cells estimated at 30-40% of marrow cells.   Marrow was variably cellular (approximately 45%) with background trilineage hematopoiesis. There was no significant increase in marrow reticulin fibers. Storage iron was present.    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 He develoed herpes zoster in 04/2008. He developed a pulmonary embolism in 05/2013. He was initially on Xarelto, but is now on Eliquis. He had an episode of pneumonia around this time requiring a brief admission. He developed severe lower leg cramps on 08/07/2014 secondary to hypokalemia. Duplex was negative.   He was treated for C difficile colitis (Flagyl completed 07/30/2015).  He  has a chronic indwelling pain pump.  He received 4 cycles of Pomalyst and Decadron (08/27/2015 - 11/19/2015).  Restaging studies document progressive disease.  Kappa free light chains are increasing.  SPEP revealed 0.5 gm/dL monoclonal protein then 1.3 gm/dL.  Bone survey reveals increase conspicuity of subcentimeter lytic lesions in the calvarium.  Bone marrow reveals 30-40% plasma cells.   MUGA on 11/30/2015 revealed an ejection fraction of 46%.  He is felt not to be a good candidate for Kyprolis.  There were no focal wall motion abnormalities.  He had a stress echo less than 1 year ago.  He has a history of PVCs and atrial fibrillation.  He takes Cardizem prn.  He received 17 weeks of daratumumab (Darzalex) (12/09/2015 - 05/25/2016).  He tolerated treatment well without side effect.  Bone marrow  on 02/09/2016 revealed no diagnostic morphologic evidence of plasma cell myeloma.  Marrow was normocellular to hypocellular marrow for age (ranging from 10-40%) with maturing trilineage hematopoiesis and mild multilineage dyspoiesis.  There was patchy mild increase in reticulin.  Storage iron was present.  Flow cytometry revealed no definitive evidence of monoclonality.  There was a non-specific atypical myeloid and monocytic findings with no increase in blasts.  Cytogenetics were normal (46, XY).  He is currently 14 months s/p 2nd autologous stem cell transplant on 06/16/2016.  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  He also experienced atrial fibrillation with intermittent episodes of RVR requiring IV beta blockers.  He is on prophylactic valacyclovir for 1 year post transplant.  He started vaccinations (DTaP-Pediatric triple vaccine, Hep B- Pediatrix triple vaccine, Haemophilus influenza B (Hib), inactivated polio virus (IPV), pneumococcal conjugate vaccine 13-valent (PCV 13)) on 04/17/2017.  Recommendation was for Shingrix vaccine to be given in  [place] and second dose of Shingrix in 2 months.  He had follow-up vaccinations on 08/28/2017.    He is s/p 9 cycles of Daratumumab (Darzalex) post transplant (12/05/2016 - 11/292018).  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  Work-up on 08/23/2016 revealed the following normal studies:  B12 and folate.  Ferritin was 1379 (high).  Iron studies included a saturation of 56% and TIBC 141 (low).  ESR was 64.  Thrombocytopenia has resolved.  He has a history of persistent hypomagnesemia and receives IV magnesium (2-4 gm) weekly.  He has not required magnesium since 10/24/2016.  Symptomatically, he feels good.  Exam is stable.  WBC is 7300 with an [place] of 5700. Hemoglobin 11.9, hematocrit 34.2, platelets 207,000.  Creatinine is 1.33 (stable).  Plan:  1.  Labs today:  CBC with diff, CMP, Mg, SPEP, FLCA. 2.  Patient to take own premeds from home:  Ondansetron (8 mg), Tylenol (650 mg), and Benadryl (50 mg). 3.  Blood counts stable and adequate for treatment. Cycle #10 daratumumab today. 4.  Rx:  Eliquis 5 mg BID (dis: #30; 5 refills). 5.  RTC on 11/01/2017 for MD assessment, labs (CBC with diff, CMP, Mg, SPEP), and cycle #11 daratumumab.   Lequita Asal, MD  10/04/2017 9:25 AM

## 2017-10-04 ENCOUNTER — Inpatient Hospital Stay (HOSPITAL_BASED_OUTPATIENT_CLINIC_OR_DEPARTMENT_OTHER): Payer: Medicare Other | Admitting: Hematology and Oncology

## 2017-10-04 ENCOUNTER — Other Ambulatory Visit: Payer: Self-pay

## 2017-10-04 ENCOUNTER — Inpatient Hospital Stay: Payer: Medicare Other | Attending: Hematology and Oncology

## 2017-10-04 ENCOUNTER — Inpatient Hospital Stay: Payer: Medicare Other

## 2017-10-04 ENCOUNTER — Encounter: Payer: Self-pay | Admitting: Hematology and Oncology

## 2017-10-04 VITALS — BP 125/76 | HR 90 | Temp 97.5°F | Resp 20 | Ht 67.0 in | Wt 166.1 lb

## 2017-10-04 DIAGNOSIS — Z86711 Personal history of pulmonary embolism: Secondary | ICD-10-CM

## 2017-10-04 DIAGNOSIS — Z978 Presence of other specified devices: Secondary | ICD-10-CM | POA: Diagnosis not present

## 2017-10-04 DIAGNOSIS — G629 Polyneuropathy, unspecified: Secondary | ICD-10-CM | POA: Diagnosis not present

## 2017-10-04 DIAGNOSIS — Z9484 Stem cells transplant status: Secondary | ICD-10-CM

## 2017-10-04 DIAGNOSIS — F419 Anxiety disorder, unspecified: Secondary | ICD-10-CM | POA: Diagnosis not present

## 2017-10-04 DIAGNOSIS — E785 Hyperlipidemia, unspecified: Secondary | ICD-10-CM

## 2017-10-04 DIAGNOSIS — H9193 Unspecified hearing loss, bilateral: Secondary | ICD-10-CM

## 2017-10-04 DIAGNOSIS — M5489 Other dorsalgia: Secondary | ICD-10-CM | POA: Insufficient documentation

## 2017-10-04 DIAGNOSIS — G8929 Other chronic pain: Secondary | ICD-10-CM

## 2017-10-04 DIAGNOSIS — D638 Anemia in other chronic diseases classified elsewhere: Secondary | ICD-10-CM | POA: Diagnosis not present

## 2017-10-04 DIAGNOSIS — I4891 Unspecified atrial fibrillation: Secondary | ICD-10-CM | POA: Insufficient documentation

## 2017-10-04 DIAGNOSIS — N400 Enlarged prostate without lower urinary tract symptoms: Secondary | ICD-10-CM

## 2017-10-04 DIAGNOSIS — Z9221 Personal history of antineoplastic chemotherapy: Secondary | ICD-10-CM | POA: Insufficient documentation

## 2017-10-04 DIAGNOSIS — C9002 Multiple myeloma in relapse: Secondary | ICD-10-CM

## 2017-10-04 DIAGNOSIS — Z7901 Long term (current) use of anticoagulants: Secondary | ICD-10-CM | POA: Diagnosis not present

## 2017-10-04 DIAGNOSIS — Z8673 Personal history of transient ischemic attack (TIA), and cerebral infarction without residual deficits: Secondary | ICD-10-CM | POA: Insufficient documentation

## 2017-10-04 DIAGNOSIS — Z87891 Personal history of nicotine dependence: Secondary | ICD-10-CM | POA: Insufficient documentation

## 2017-10-04 DIAGNOSIS — Z87442 Personal history of urinary calculi: Secondary | ICD-10-CM | POA: Insufficient documentation

## 2017-10-04 DIAGNOSIS — Z5112 Encounter for antineoplastic immunotherapy: Secondary | ICD-10-CM | POA: Insufficient documentation

## 2017-10-04 DIAGNOSIS — I10 Essential (primary) hypertension: Secondary | ICD-10-CM | POA: Diagnosis not present

## 2017-10-04 DIAGNOSIS — K219 Gastro-esophageal reflux disease without esophagitis: Secondary | ICD-10-CM

## 2017-10-04 DIAGNOSIS — C9000 Multiple myeloma not having achieved remission: Secondary | ICD-10-CM

## 2017-10-04 DIAGNOSIS — Z79899 Other long term (current) drug therapy: Secondary | ICD-10-CM | POA: Insufficient documentation

## 2017-10-04 DIAGNOSIS — C9001 Multiple myeloma in remission: Secondary | ICD-10-CM

## 2017-10-04 DIAGNOSIS — Z800 Family history of malignant neoplasm of digestive organs: Secondary | ICD-10-CM

## 2017-10-04 DIAGNOSIS — Z7189 Other specified counseling: Secondary | ICD-10-CM

## 2017-10-04 DIAGNOSIS — M549 Dorsalgia, unspecified: Secondary | ICD-10-CM

## 2017-10-04 LAB — CBC WITH DIFFERENTIAL/PLATELET
Basophils Absolute: 0 10*3/uL (ref 0–0.1)
Basophils Relative: 0 %
Eosinophils Absolute: 0.1 10*3/uL (ref 0–0.7)
Eosinophils Relative: 1 %
HCT: 34.2 % — ABNORMAL LOW (ref 40.0–52.0)
Hemoglobin: 11.9 g/dL — ABNORMAL LOW (ref 13.0–18.0)
Lymphocytes Relative: 17 %
Lymphs Abs: 1.2 10*3/uL (ref 1.0–3.6)
MCH: 35.7 pg — ABNORMAL HIGH (ref 26.0–34.0)
MCHC: 34.8 g/dL (ref 32.0–36.0)
MCV: 102.7 fL — ABNORMAL HIGH (ref 80.0–100.0)
Monocytes Absolute: 0.3 10*3/uL (ref 0.2–1.0)
Monocytes Relative: 4 %
Neutro Abs: 5.7 10*3/uL (ref 1.4–6.5)
Neutrophils Relative %: 78 %
Platelets: 207 10*3/uL (ref 150–440)
RBC: 3.33 MIL/uL — ABNORMAL LOW (ref 4.40–5.90)
RDW: 14.9 % — ABNORMAL HIGH (ref 11.5–14.5)
WBC: 7.3 10*3/uL (ref 3.8–10.6)

## 2017-10-04 LAB — COMPREHENSIVE METABOLIC PANEL
ALT: 14 U/L — ABNORMAL LOW (ref 17–63)
AST: 21 U/L (ref 15–41)
Albumin: 4 g/dL (ref 3.5–5.0)
Alkaline Phosphatase: 45 U/L (ref 38–126)
Anion gap: 6 (ref 5–15)
BUN: 26 mg/dL — ABNORMAL HIGH (ref 6–20)
CO2: 24 mmol/L (ref 22–32)
Calcium: 9.3 mg/dL (ref 8.9–10.3)
Chloride: 109 mmol/L (ref 101–111)
Creatinine, Ser: 1.33 mg/dL — ABNORMAL HIGH (ref 0.61–1.24)
GFR calc Af Amer: >60 mL/min (ref >60)
GFR calc non Af Amer: 52 mL/min — ABNORMAL LOW (ref >60)
Glucose, Bld: 118 mg/dL — ABNORMAL HIGH (ref 65–99)
Potassium: 4.3 mmol/L (ref 3.5–5.1)
Sodium: 139 mmol/L (ref 135–145)
Total Bilirubin: 0.8 mg/dL (ref 0.3–1.2)
Total Protein: 6.4 g/dL — ABNORMAL LOW (ref 6.5–8.1)

## 2017-10-04 LAB — MAGNESIUM: Magnesium: 2 mg/dL (ref 1.7–2.4)

## 2017-10-04 MED ORDER — SODIUM CHLORIDE 0.9 % IV SOLN
1200.0000 mg | Freq: Once | INTRAVENOUS | Status: AC
  Administered 2017-10-04: 1200 mg via INTRAVENOUS
  Filled 2017-10-04: qty 60

## 2017-10-04 MED ORDER — SODIUM CHLORIDE 0.9 % IV SOLN
Freq: Once | INTRAVENOUS | Status: AC
  Administered 2017-10-04: 10:00:00 via INTRAVENOUS
  Filled 2017-10-04: qty 1000

## 2017-10-04 MED ORDER — ONDANSETRON 8 MG PO TBDP: 8.0000 mg | ORAL_TABLET | Freq: Once | ORAL | Status: DC

## 2017-10-04 MED ORDER — ACETAMINOPHEN 325 MG PO TABS: 650.0000 mg | ORAL_TABLET | Freq: Once | ORAL | Status: DC

## 2017-10-04 MED ORDER — SODIUM CHLORIDE 0.9% FLUSH
10.0000 mL | INTRAVENOUS | Status: DC | PRN
  Filled 2017-10-04: qty 10

## 2017-10-04 MED ORDER — DIPHENHYDRAMINE HCL 25 MG PO CAPS: 50.0000 mg | ORAL_CAPSULE | Freq: Once | ORAL | Status: DC

## 2017-10-04 MED ORDER — APIXABAN 5 MG PO TABS: 5.0000 mg | ORAL_TABLET | Freq: Two times a day (BID) | ORAL | 5 refills | Status: DC

## 2017-10-04 MED ORDER — HEPARIN SOD (PORK) LOCK FLUSH 100 UNIT/ML IV SOLN
500.0000 [IU] | Freq: Once | INTRAVENOUS | Status: AC | PRN
  Administered 2017-10-04: 500 [IU]

## 2017-10-04 NOTE — Progress Notes (Signed)
Patient request for RFs on his next Eliquis script.

## 2017-10-05 LAB — PROTEIN ELECTROPHORESIS, SERUM
A/G Ratio: 1.7 (ref 0.7–1.7)
Albumin ELP: 3.5 g/dL (ref 2.9–4.4)
Alpha-1-Globulin: 0.1 g/dL (ref 0.0–0.4)
Alpha-2-Globulin: 0.9 g/dL (ref 0.4–1.0)
Beta Globulin: 0.8 g/dL (ref 0.7–1.3)
Gamma Globulin: 0.3 g/dL — ABNORMAL LOW (ref 0.4–1.8)
Globulin, Total: 2.1 g/dL — ABNORMAL LOW (ref 2.2–3.9)
Total Protein ELP: 5.6 g/dL — ABNORMAL LOW (ref 6.0–8.5)

## 2017-10-07 ENCOUNTER — Encounter: Payer: Self-pay | Admitting: Hematology and Oncology

## 2017-10-17 ENCOUNTER — Other Ambulatory Visit: Payer: Self-pay | Admitting: Urgent Care

## 2017-10-31 ENCOUNTER — Other Ambulatory Visit: Payer: Self-pay | Admitting: Hematology and Oncology

## 2017-10-31 NOTE — Progress Notes (Signed)
[facility] day:  11/01/17  Chief Complaint: Logan Perez is a 72 y.o. 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   HPI: The patient was last seen in the medical oncology clinic on 10/04/2017.  At that time, he felt good.  Exam was stable.  WBC was 7300 with an [place] of 5700. Hemoglobin was 11.9, hematocrit 34.2, platelets 207,000.  Creatinine was 1.33 (stable).  SPEP revealed no monoclonal protein.  During the interim, patient has had a "good and uneventful month". Patient feels well. He verbalizes no acute concerns. Patient has not experienced any B symptoms or interval infections. Patient scheduled to follow up with transplant team in March or April. He is eating well. His weight is up 1 pound.   Pain is well controlled with current pain pump. Pain rated 0/10 today.    Past Medical History:  Diagnosis Date  . Anxiety   . Atrial fibrillation ([place])   . BPH (benign prostatic hyperplasia)   . Complication of anesthesia    BAD HEADACHE NIGHT OF FIRST CATARACT  . Compression fracture of lumbar vertebra ([place])   . Difficulty voiding   . Dysrhythmia    A FIB  . Elevated PSA   . GERD (gastroesophageal reflux disease)   . Hearing aid worn    bilateral  . History of kidney stones   . HLD (hyperlipidemia)   . HOH (hard of hearing)   . Hypertension   . Multiple myeloma ([place])   . Neuropathy    feet. 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  . Pain    BACK  . Palpitations   . Pneumonia   . Pulmonary embolism ([place])   . Sepsis ([place])   . Stroke [facility])    TIA, detected on CT scan. pt was unaware    Past Surgical History:  Procedure Laterality Date  . BACK SURGERY  1994  . CATARACT EXTRACTION W/PHACO Right 05/02/2016   Procedure: CATARACT EXTRACTION PHACO AND INTRAOCULAR LENS PLACEMENT (IOC);   Surgeon: Birder Robson, MD;  Location: [facility];  Service: Ophthalmology;  Laterality: Right;  [place] 1.06 AP% 20.6 CDE 13.70 FLUID PACK LOT # [id number]  . CATARACT EXTRACTION W/PHACO Left 05/16/2016   Procedure: CATARACT EXTRACTION PHACO AND INTRAOCULAR LENS PLACEMENT (IOC);  Surgeon: Birder Robson, MD;  Location: [facility];  Service: Ophthalmology;  Laterality: Left;  [place] 01:43 AP% 19.8 CDE 20.45 FLUID PACK LOT #[id number] H  . COLONOSCOPY WITH PROPOFOL N/A 03/22/2017   Procedure: COLONOSCOPY WITH PROPOFOL;  Surgeon: Lucilla Lame, MD;  Location: [place];  Service: Endoscopy;  Laterality: N/A;  has port  . ESOPHAGOGASTRODUODENOSCOPY (EGD) WITH PROPOFOL  03/22/2017   Procedure: ESOPHAGOGASTRODUODENOSCOPY (EGD) WITH PROPOFOL;  Surgeon: Lucilla Lame, MD;  Location: [place];  Service: Endoscopy;;  . EYE SURGERY    . KNEE ARTHROSCOPY Left 1992  . LIMBAL STEM CELL TRANSPLANT    . PAIN PUMP IMPLANTATION  2012  . PAIN PUMP IMPLANTATION N/A 09/04/2017   Procedure: INTRATHECAL PUMP BATTERY CHANGE;  Surgeon: Milinda Pointer, MD;  Location: [facility];  Service: Neurosurgery;  Laterality: N/A;  . PORTA CATH INSERTION N/A 12/04/2016   Procedure: Glori Luis Cath Insertion;  Surgeon: Algernon Huxley, MD;  Location: [facility];  Service: Cardiovascular;  Laterality: N/A;  . stem cell implant  2008   [facility]  Family History  Problem Relation Age of Onset  . Cancer Father        throat  . Kidney disease Sister   . Stroke Unknown   . Stroke Mother   . Bladder Cancer Neg Hx   . Prostate cancer Neg Hx   . Kidney cancer Neg Hx     Social History:  reports that he quit smoking about 42 years ago. His smoking use included cigarettes. He has a 30.00 pack-year smoking history. he has never used smokeless tobacco. He reports that he does not drink alcohol or use drugs.  He has a wire haired dachshund.  He lives in [place].  His wife's name is [name].  The patient is alone today.  Allergies:   Allergies  Allergen Reactions  . Azithromycin Diarrhea and Other (See Comments)    Possible cause of C. Diff  . Bee Pollen Other (See Comments)    Sneezing, watery eyes, runny nose  . Pollen Extract Other (See Comments)    Sneezing, watery eyes, runny nose  . Zometa [Zoledronic Acid] Other (See Comments)    ONG- Osteonecrosis of the jaw   . Rivaroxaban Rash    Current Medications: Current Outpatient Medications  Medication Sig Dispense Refill  . acetaminophen (TYLENOL) 325 MG tablet Take 650 mg every 6 (six) hours as needed by mouth for mild pain or headache.     . ALPRAZolam (XANAX) 0.25 MG tablet TAKE 1 TABLET BY MOUTH EVERY NIGHT AT BEDTIME, AS NEEDED FOR ANXIETY 30 tablet 1  . apixaban (ELIQUIS) 5 MG TABS tablet Take 1 tablet (5 mg total) by mouth 2 (two) times daily. 60 tablet 5  . baclofen (LIORESAL) 10 MG tablet Take 1 tablet (10 mg total) by mouth at bedtime. 30 tablet 5  . Calcium Carb-Cholecalciferol (CALCIUM-VITAMIN D) 500-400 MG-UNIT TABS Take 1 tablet daily by mouth.    . cetirizine (ZYRTEC) 10 MG tablet Take 10 mg by mouth daily as needed for allergies.     . Cholecalciferol (VITAMIN D3) 2000 units capsule Take 2,000 Units daily by mouth.     . Daratumumab (DARZALEX IV) Inject 1,200 mg every 30 (thirty) days into the vein.     [name] dexamethasone (DECADRON) 4 MG tablet Take 4 tabs PO 1 hr prior to infusion, then take 1 tab on day 2 and day 3 30 tablet 1  . diltiazem (CARDIZEM CD) 120 MG 24 hr capsule Take 120 mg daily by mouth.     . diltiazem (CARDIZEM) 60 MG tablet Take 60 mg daily as needed by mouth (for increased heart rate > 140).     [name] diphenhydrAMINE (BENADRYL) 25 MG tablet Take 25 mg every 6 (six) hours as needed by mouth for itching or allergies.     [name] ENSURE (ENSURE) Take 237 mLs by mouth daily.    . fluticasone (FLONASE) 50 MCG/ACT nasal spray Place 1 spray into the nose daily as needed for allergies.     . Hypromellose (ARTIFICIAL TEARS OP) Place 1 drop as  needed into both eyes (for dry eyes).    [name] loperamide (IMODIUM) 2 MG capsule Take 4 mg as needed by mouth for diarrhea or loose stools.     . lovastatin (MEVACOR) 20 MG tablet Take 20 mg by mouth every evening.     . montelukast (SINGULAIR) 10 MG tablet TAKE 1 TABLET BY MOUTH ONCE A DAY AS DIRECTED. (Patient taking differently: Take 50ms daily the day before the infusion, the day of, and the  2 days after infusion) 30 tablet 0  . Multiple Vitamins-Iron (MULTIVITAMIN/IRON PO) Take 1 tablet daily by mouth.    [name] omeprazole (PRILOSEC) 20 MG capsule Take 20 mg by mouth daily.     . ondansetron (ZOFRAN) 8 MG tablet Take 8 mg every 8 (eight) hours as needed by mouth for nausea or vomiting.    [name] PAIN MANAGEMENT IT PUMP REFILL 1 each once for 1 dose by Intrathecal route. Medication: PF Fentanyl 1,500.0 mcg/ml PF Bupivicaine 30.0 mg/ml PF Clonidine 300.0 mcg/ml Total Volume: 40 ml Needed by 09-04-17 @ 1000  Please deliver to the OR 1 each 0  . potassium chloride SA (K-DUR,KLOR-CON) 20 MEQ tablet TAKE 1 TABLET(20 MEQ) BY MOUTH DAILY 90 tablet 0  . ranitidine (ZANTAC) 150 MG tablet Take 150 mg by mouth at bedtime.     . tamsulosin (FLOMAX) 0.4 MG CAPS capsule Take 1 capsule (0.4 mg total) by mouth daily. 90 capsule 4  . valACYclovir (VALTREX) 500 MG tablet TAKE 1 TABLET BY MOUTH DAILY 30 tablet 3  . vitamin B-12 (CYANOCOBALAMIN) 1000 MCG tablet Take 1,000 mcg daily by mouth.      No current facility-administered medications for this visit.     Review of Systems:  GENERAL: Feels "alright".  No fevers or sweats.  Weight up 1 pound. PERFORMANCE STATUS (ECOG): 1 HEENT:  Cataracts s/p surgery.  No sore throat, mouth sores or tenderness. Lungs:  No shortness of breath.  No cough.  No hemoptysis. Cardiac:  No chest pain, palpitations, orthopnea, or PND. GI:  Appetite good. Diarrhea, managed (2 Imodium/day).  No vomiting, constipation, melena or hematochezia. GU:  No urgency, frequency, dysuria, or  hematuria. Musculoskeletal:  Compression fracture of back on a pain pump. No muscle tenderness. Extremities:  No pain or swelling. Skin:  s/p Moh's surgery.  Fragile skin.  No rashes or skin changes. Neuro:  Little tremor (chronic).  Neuropathy in feet.  Sensitivity in hands.  No headache, weakness, balance or coordination issues. Endocrine:  No diabetes, thyroid issues, hot flashes or night sweats. Psych:  No mood changes, depression or anxiety. Pain:  Chronic back pain (pain well managed with pump). 0/10 pain today.  Review of systems:  All other systems reviewed and found to be negative.   Physical Exam: Blood pressure 107/72, pulse 92, temperature (!) 97.5 F (36.4 C), temperature source Oral, resp. rate 12, height 5' 7" (1.702 m), weight 167 lb 6.4 oz (75.9 kg). GENERAL:  Well developed, well nourished gentleman sitting comfortably in the exam room in no acute distress.  MENTAL STATUS:  Alert and oriented to person, place and time. HEAD:  Short brown hair.  Graying goatee.  Normocephalic, atraumatic, face symmetric, no Cushingoid features. EYES:  Glasses.  Blue eyes s/p cataract surgery.  Pupils equal round and reactive to light and accomodation.  No conjunctivitis or scleral icterus. ENT:  Hearing aide.  Oropharynx clear without lesion.  Tongue normal. Mucous membranes moist.  RESPIRATORY:  Clear to auscultation without rales, wheezes or rhonchi. CARDIOVASCULAR:  Regular rate and rhythm without murmur, rub or gallop. ABDOMEN:  RUQ pain pump.  Soft, non-tender, with active bowel sounds, and no hepatosplenomegaly.  No masses. SKIN:  Small area of transplanted skin on tip of nose s/p Moh's surgery.  No rashes, ulcers or lesions. EXTREMITIES: No edema, skin discoloration or tenderness.  No palpable cords. LYMPH NODES: No palpable cervical, supraclavicular, axillary or inguinal adenopathy  NEUROLOGICAL: Intention tremor. PSYCH:  Appropriate.    Appointment on  11/01/2017  Component Date  Value Ref Range Status  . Magnesium 11/01/2017 1.9  1.7 - 2.4 mg/dL Final   Performed at [name], [address] [place], [place] [address]  . Sodium 11/01/2017 138  135 - 145 mmol/L Final  . Potassium 11/01/2017 4.1  3.5 - 5.1 mmol/L Final  . Chloride 11/01/2017 104  101 - 111 mmol/L Final  . CO2 11/01/2017 24  22 - 32 mmol/L Final  . Glucose, Bld 11/01/2017 143* 65 - 99 mg/dL Final  . BUN 11/01/2017 19  6 - 20 mg/dL Final  . Creatinine, Ser 11/01/2017 1.51* 0.61 - 1.24 mg/dL Final  . Calcium 11/01/2017 9.2  8.9 - 10.3 mg/dL Final  . Total Protein 11/01/2017 6.2* 6.5 - 8.1 g/dL Final  . Albumin 11/01/2017 4.1  3.5 - 5.0 g/dL Final  . AST 11/01/2017 26  15 - 41 U/L Final  . ALT 11/01/2017 14* 17 - 63 U/L Final  . Alkaline Phosphatase 11/01/2017 46  38 - 126 U/L Final  . Total Bilirubin 11/01/2017 0.6  0.3 - 1.2 mg/dL Final  . GFR calc non Af Amer 11/01/2017 45* >60 mL/min Final  . GFR calc Af Amer 11/01/2017 52* >60 mL/min Final   Comment: (NOTE) The eGFR has been calculated using the CKD EPI equation. This calculation has not been validated in all clinical situations. eGFR's persistently <60 mL/min signify possible Chronic Kidney Disease.   Georgiann Hahn gap 11/01/2017 10  5 - 15 Final   Performed at [facility], [address] [place], [place] [address]  . WBC 11/01/2017 7.7  3.8 - 10.6 K/uL Final  . RBC 11/01/2017 3.50* 4.40 - 5.90 MIL/uL Final  . Hemoglobin 11/01/2017 12.5* 13.0 - 18.0 g/dL Final  . HCT 11/01/2017 35.8* 40.0 - 52.0 % Final  . MCV 11/01/2017 102.3* 80.0 - 100.0 fL Final  . MCH 11/01/2017 35.6* 26.0 - 34.0 pg Final  . MCHC 11/01/2017 34.8  32.0 - 36.0 g/dL Final  . RDW 11/01/2017 14.5  11.5 - 14.5 % Final  . Platelets 11/01/2017 205  150 - 440 K/uL Final  . Neutrophils Relative % 11/01/2017 63  % Final  . Neutro Abs 11/01/2017 4.9  1.4 - 6.5 K/uL Final  . Lymphocytes Relative 11/01/2017 25  % Final  . Lymphs Abs 11/01/2017 1.9  1.0 - 3.6 K/uL  Final  . Monocytes Relative 11/01/2017 8  % Final  . Monocytes Absolute 11/01/2017 0.6  0.2 - 1.0 K/uL Final  . Eosinophils Relative 11/01/2017 3  % Final  . Eosinophils Absolute 11/01/2017 0.2  0 - 0.7 K/uL Final  . Basophils Relative 11/01/2017 1  % Final  . Basophils Absolute 11/01/2017 0.0  0 - 0.1 K/uL Final   Performed at [facility], [address], [place], [place] [address]    Assessment:  Logan Perez is a 71 y.o. male with stage III mutiple myeloma.  He initially presented with progressive back pain beginning in 12/2006.  MRI revealed "spots and compression fractures".  He began Velcade, thalidomide, and Decadron.  In 08/2007, he underwent high dose chemotherapy and autologous stem cell transplant.  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  He recurred with a rising M-spike (2.7) with repeat M spike (1.7 gm/dl) in 03/2010.  He was initially treated with Velcade (02/08/2010 - 05/10/2010).  He then began Revlimid (15 mg 3 weeks on/1 week off) and Decadron (40 mg on day 1, 8, 15, 22).  Because of significant side effect with Decadron his dose was decreased to 10 mg once a week in 07/2010.    He was on maintenance Revlimid. Revlimid was initially10 mg 3 weeks on/1 week off. This was changed to 10 mg 2 weeks on/2 weeks off secondary to right nipple tenderness. His dose was increased to 10 mg 3 weeks on/1 week off with Decadron 10 mg a week (on Sundays) and then Revlamid 15 mg 3 weeks on and 1 week off with Decadron on Sundays.  He began Pomalyst 4 mg 3 weeks on/1 week off with Decadron on 08/27/2015.  Over the past year his SPEP has revealed no monoclonal protein (04/21/2015) and 0.5 gm/dL on 09/22/2015 and 10/20/2015. M spike was 0.1 on 02/02/2016, 03/01/2016, 03/29/2016, 04/26/2016, and 05/24/2016.  M spike was 0 on 10/11/2016, 11/28/2016, 02/05/2017, 03/21/2017, 08/02/2017, and 10/04/2017.  Free light chains have been monitored. Kappa free  light chains were 18.54 on 11/21/2013, 18.37 on 02/20/2014, 18.93 on 05/22/2014, 32.58 (high; normal ratio 1.27) on 08/21/2014, 51.53 (high; elevated ratio of 2.12) on 11/13/2014, 28.08 (ratio 1.73) on 12/09/2014, 23.71 (ratio 2.17) on 01/18/2015, 92.93 (ratio 9.49) on 04/21/2015, 93.44 (ratio 10.28) on 05/26/2015, 255.45 (ratio of 24.05) on 07/14/2015, 373.89 (ratio 48.31) on 08/04/2015, 474.33 (ratio 70.58) on 08/25/2015, 450.76 (ratio 55.44) on 09/22/2015, 453.4 (ratio 59.89) on 10/20/2015, 58.26 (ratio of > 40.74) on 02/02/2016, 62.67 (ratio of 37.98) on 03/01/2016, 75.7 (ratio > 50.47) on 03/29/2016, 79.7 (ratio 53.13) on 04/26/2016, 108.6 (ratio > 72.4) on 05/24/2016, 8.3 (1.46 ratio) on 10/11/2016, 6.7 (0.81 ratio) on 02/05/2017, 7.8 (1.01 ratio) on 03/21/2017, and 7.6 (ratio 0.63) on 06/01/2017.  Bone survey on 12/08/2014 was stable.  Bone survey on 10/21/2015 revealed increase conspicuity of subcentimeter lytic lesions in the calvarium.  Bone marrow aspirate and biopsy on 11/04/2015 revealed an atypical monoclonal plasma cells estimated at 30-40% of marrow cells.   Marrow was variably cellular (approximately 45%) with background trilineage hematopoiesis. There was no significant increase in marrow reticulin fibers. Storage iron was present.    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 He develoed herpes zoster in 04/2008. He developed a pulmonary embolism in 05/2013. He was initially on Xarelto, but is now on Eliquis. He had an episode of pneumonia around this time requiring a brief admission. He developed severe lower leg cramps on 08/07/2014 secondary to hypokalemia. Duplex was negative.   He was treated for C difficile colitis (Flagyl completed 07/30/2015).  He has a chronic indwelling pain pump.  He received 4 cycles of Pomalyst and Decadron (08/27/2015 - 11/19/2015).  Restaging studies document progressive disease.  Kappa free  light chains are increasing.  SPEP revealed 0.5 gm/dL monoclonal protein then 1.3 gm/dL.  Bone survey reveals increase conspicuity of subcentimeter lytic lesions in the calvarium.  Bone marrow reveals 30-40% plasma cells.   MUGA on 11/30/2015 revealed an ejection fraction of 46%.  He is felt not to be a good candidate for Kyprolis.  There were no focal wall motion abnormalities.  He had a stress echo less than 1 year ago.  He has a history of PVCs and atrial fibrillation.  He takes Cardizem prn.  He received 17 weeks of daratumumab (Darzalex) (12/09/2015 - 05/25/2016).  He tolerated treatment well without side effect.  Bone marrow on 02/09/2016 revealed no diagnostic morphologic evidence of plasma cell myeloma.  Marrow was normocellular to hypocellular marrow for age (ranging from 10-40%) with maturing trilineage hematopoiesis and mild  multilineage dyspoiesis.  There was patchy mild increase in reticulin.  Storage iron was present.  Flow cytometry revealed no definitive evidence of monoclonality.  There was a non-specific atypical myeloid and monocytic findings with no increase in blasts.  Cytogenetics were normal (46, XY).  He is currently 16 months s/p 2nd autologous stem cell transplant on 06/16/2016.  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  He also experienced atrial fibrillation with intermittent episodes of RVR requiring IV beta blockers.  He is on prophylactic valacyclovir for 1 year post transplant.  He started vaccinations (DTaP-Pediatric triple vaccine, Hep B- Pediatrix triple vaccine, Haemophilus influenza B (Hib), inactivated polio virus (IPV), pneumococcal conjugate vaccine 13-valent (PCV 13)) on 04/17/2017.  Recommendation was for Shingrix vaccine to be given in [place] and second dose of Shingrix in 2 months.  He had follow-up vaccinations on 08/28/2017.    He is s/p 10 cycles of Daratumumab (Darzalex) post transplant (12/05/2016 -  12/272018).  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  Work-up on 08/23/2016 revealed the following normal studies:  B12 and folate.  Ferritin was 1379 (high).  Iron studies included a saturation of 56% and TIBC 141 (low).  ESR was 64.  Thrombocytopenia has resolved.  He has a history of persistent hypomagnesemia and receives IV magnesium (2-4 gm) weekly.  He has not required magnesium since 10/24/2016.  Symptomatically, he feels good.  Exam is stable.  WBC is 7700 with an [place] of 4900. Hemoglobin is 12.5, hematocrit 35.8, platelets 205,000.  Creatinine is 1.51 (CrCl 42 mL/min)  Plan:  1.  Labs today:  CBC with diff, CMP, Mg, SPEP. 2.  Patient to take own premeds from home:  Ondansetron (8 mg), Tylenol (650 mg), and Benadryl (50 mg). 3.  Blood counts stable and adequate for treatment. Cycle #11 daratumumab today. 4.  Continue Eliquis 5 mg BID as previously scheduled.  5.  RTC in 4 weeks for MD assessment, labs (CBC with diff, CMP, Mg, SPEP, FLCA), and cycle #12 daratumumab.   Honor Loh, NP 11/01/2017 9:24 AM  I saw and evaluated the patient, participating in the key portions of the service and reviewing pertinent diagnostic studies and records.  I reviewed the nurse practitioner's note and agree with the findings and the plan.  The assessment and plan were discussed with the patient.  A few questions were asked by the patient and answered.   Nolon Stalls, MD 11/01/2017,9:24 AM

## 2017-11-01 ENCOUNTER — Inpatient Hospital Stay: Payer: Medicare Other

## 2017-11-01 ENCOUNTER — Encounter: Payer: Self-pay | Admitting: Hematology and Oncology

## 2017-11-01 ENCOUNTER — Inpatient Hospital Stay: Payer: Medicare Other | Attending: Hematology and Oncology

## 2017-11-01 ENCOUNTER — Other Ambulatory Visit: Payer: Self-pay

## 2017-11-01 ENCOUNTER — Inpatient Hospital Stay (HOSPITAL_BASED_OUTPATIENT_CLINIC_OR_DEPARTMENT_OTHER): Payer: Medicare Other | Admitting: Hematology and Oncology

## 2017-11-01 VITALS — BP 139/87 | HR 82 | Temp 96.1°F | Resp 18

## 2017-11-01 VITALS — BP 107/72 | HR 92 | Temp 97.5°F | Resp 12 | Ht 67.0 in | Wt 167.4 lb

## 2017-11-01 DIAGNOSIS — Z9484 Stem cells transplant status: Secondary | ICD-10-CM | POA: Diagnosis not present

## 2017-11-01 DIAGNOSIS — Z7901 Long term (current) use of anticoagulants: Secondary | ICD-10-CM | POA: Diagnosis not present

## 2017-11-01 DIAGNOSIS — Z5112 Encounter for antineoplastic immunotherapy: Secondary | ICD-10-CM | POA: Diagnosis present

## 2017-11-01 DIAGNOSIS — D539 Nutritional anemia, unspecified: Secondary | ICD-10-CM | POA: Insufficient documentation

## 2017-11-01 DIAGNOSIS — C9000 Multiple myeloma not having achieved remission: Secondary | ICD-10-CM | POA: Diagnosis present

## 2017-11-01 DIAGNOSIS — C9001 Multiple myeloma in remission: Secondary | ICD-10-CM

## 2017-11-01 DIAGNOSIS — D638 Anemia in other chronic diseases classified elsewhere: Secondary | ICD-10-CM | POA: Insufficient documentation

## 2017-11-01 DIAGNOSIS — Z86711 Personal history of pulmonary embolism: Secondary | ICD-10-CM | POA: Diagnosis not present

## 2017-11-01 DIAGNOSIS — Z7189 Other specified counseling: Secondary | ICD-10-CM

## 2017-11-01 DIAGNOSIS — C9002 Multiple myeloma in relapse: Secondary | ICD-10-CM

## 2017-11-01 LAB — COMPREHENSIVE METABOLIC PANEL
ALBUMIN: 4.1 g/dL (ref 3.5–5.0)
ALK PHOS: 46 U/L (ref 38–126)
ALT: 14 U/L — ABNORMAL LOW (ref 17–63)
ANION GAP: 10 (ref 5–15)
AST: 26 U/L (ref 15–41)
BILIRUBIN TOTAL: 0.6 mg/dL (ref 0.3–1.2)
BUN: 19 mg/dL (ref 6–20)
CALCIUM: 9.2 mg/dL (ref 8.9–10.3)
CO2: 24 mmol/L (ref 22–32)
Chloride: 104 mmol/L (ref 101–111)
Creatinine, Ser: 1.51 mg/dL — ABNORMAL HIGH (ref 0.61–1.24)
GFR calc Af Amer: 52 mL/min — ABNORMAL LOW (ref >60)
GFR calc non Af Amer: 45 mL/min — ABNORMAL LOW (ref >60)
GLUCOSE: 143 mg/dL — AB (ref 65–99)
Potassium: 4.1 mmol/L (ref 3.5–5.1)
Sodium: 138 mmol/L (ref 135–145)
TOTAL PROTEIN: 6.2 g/dL — AB (ref 6.5–8.1)

## 2017-11-01 LAB — CBC WITH DIFFERENTIAL/PLATELET
BASOS ABS: 0 10*3/uL (ref 0–0.1)
BASOS PCT: 1 %
EOS PCT: 3 %
Eosinophils Absolute: 0.2 10*3/uL (ref 0–0.7)
HEMATOCRIT: 35.8 % — AB (ref 40.0–52.0)
Hemoglobin: 12.5 g/dL — ABNORMAL LOW (ref 13.0–18.0)
LYMPHS PCT: 25 %
Lymphs Abs: 1.9 10*3/uL (ref 1.0–3.6)
MCH: 35.6 pg — ABNORMAL HIGH (ref 26.0–34.0)
MCHC: 34.8 g/dL (ref 32.0–36.0)
MCV: 102.3 fL — AB (ref 80.0–100.0)
MONO ABS: 0.6 10*3/uL (ref 0.2–1.0)
MONOS PCT: 8 %
NEUTROS ABS: 4.9 10*3/uL (ref 1.4–6.5)
Neutrophils Relative %: 63 %
PLATELETS: 205 10*3/uL (ref 150–440)
RBC: 3.5 MIL/uL — ABNORMAL LOW (ref 4.40–5.90)
RDW: 14.5 % (ref 11.5–14.5)
WBC: 7.7 10*3/uL (ref 3.8–10.6)

## 2017-11-01 LAB — MAGNESIUM: Magnesium: 1.9 mg/dL (ref 1.7–2.4)

## 2017-11-01 MED ORDER — SODIUM CHLORIDE 0.9 % IV SOLN
1200.0000 mg | Freq: Once | INTRAVENOUS | Status: AC
  Administered 2017-11-01: 1200 mg via INTRAVENOUS
  Filled 2017-11-01: qty 60

## 2017-11-01 MED ORDER — SODIUM CHLORIDE 0.9% FLUSH
10.0000 mL | INTRAVENOUS | Status: DC | PRN
  Administered 2017-11-01: 10 mL
  Filled 2017-11-01: qty 10

## 2017-11-01 MED ORDER — SODIUM CHLORIDE 0.9 % IV SOLN
Freq: Once | INTRAVENOUS | Status: AC
  Administered 2017-11-01: 10:00:00 via INTRAVENOUS
  Filled 2017-11-01: qty 1000

## 2017-11-01 MED ORDER — HEPARIN SOD (PORK) LOCK FLUSH 100 UNIT/ML IV SOLN
500.0000 [IU] | Freq: Once | INTRAVENOUS | Status: AC | PRN
  Administered 2017-11-01: 500 [IU]
  Filled 2017-11-01: qty 5

## 2017-11-01 NOTE — Progress Notes (Signed)
See follow up note. 

## 2017-11-01 NOTE — Progress Notes (Signed)
Previous nurse notes and MD note state patient takes pre meds at home (zofran, tylenol, benadryl). Pre meds for darzalex include dexamethasone. Verified with patient that he did and does take dexamethasone at home.

## 2017-11-01 NOTE — Progress Notes (Signed)
Pt took Zofran, Benadryl and tylenol at home at 0800, okay to proceed per Brain NP.

## 2017-11-05 LAB — PROTEIN ELECTROPHORESIS, SERUM
A/G Ratio: 1.5 (ref 0.7–1.7)
ALPHA-1-GLOBULIN: 0.2 g/dL (ref 0.0–0.4)
ALPHA-2-GLOBULIN: 0.8 g/dL (ref 0.4–1.0)
Albumin ELP: 3.5 g/dL (ref 2.9–4.4)
BETA GLOBULIN: 0.9 g/dL (ref 0.7–1.3)
GAMMA GLOBULIN: 0.3 g/dL — AB (ref 0.4–1.8)
Globulin, Total: 2.3 g/dL (ref 2.2–3.9)
M-SPIKE, %: 0.1 g/dL — AB
Total Protein ELP: 5.8 g/dL — ABNORMAL LOW (ref 6.0–8.5)

## 2017-11-18 ENCOUNTER — Other Ambulatory Visit: Payer: Self-pay | Admitting: Hematology and Oncology

## 2017-11-18 DIAGNOSIS — C9001 Multiple myeloma in remission: Secondary | ICD-10-CM

## 2017-11-21 ENCOUNTER — Other Ambulatory Visit: Payer: Self-pay | Admitting: Urgent Care

## 2017-11-21 DIAGNOSIS — C9001 Multiple myeloma in remission: Secondary | ICD-10-CM

## 2017-11-26 ENCOUNTER — Encounter: Payer: Self-pay | Admitting: Nurse Practitioner

## 2017-11-26 ENCOUNTER — Telehealth: Payer: Self-pay | Admitting: *Deleted

## 2017-11-26 ENCOUNTER — Inpatient Hospital Stay (HOSPITAL_BASED_OUTPATIENT_CLINIC_OR_DEPARTMENT_OTHER): Payer: Medicare Other | Admitting: Nurse Practitioner

## 2017-11-26 ENCOUNTER — Inpatient Hospital Stay: Payer: Medicare Other | Attending: Nurse Practitioner

## 2017-11-26 ENCOUNTER — Inpatient Hospital Stay: Payer: Medicare Other

## 2017-11-26 VITALS — BP 117/87 | HR 108 | Temp 99.4°F | Resp 18 | Ht 67.0 in | Wt 162.0 lb

## 2017-11-26 DIAGNOSIS — Z5112 Encounter for antineoplastic immunotherapy: Secondary | ICD-10-CM | POA: Diagnosis present

## 2017-11-26 DIAGNOSIS — Z9484 Stem cells transplant status: Secondary | ICD-10-CM

## 2017-11-26 DIAGNOSIS — C9001 Multiple myeloma in remission: Secondary | ICD-10-CM

## 2017-11-26 DIAGNOSIS — J069 Acute upper respiratory infection, unspecified: Secondary | ICD-10-CM

## 2017-11-26 DIAGNOSIS — R197 Diarrhea, unspecified: Secondary | ICD-10-CM

## 2017-11-26 DIAGNOSIS — C9000 Multiple myeloma not having achieved remission: Secondary | ICD-10-CM

## 2017-11-26 DIAGNOSIS — E860 Dehydration: Secondary | ICD-10-CM

## 2017-11-26 DIAGNOSIS — R05 Cough: Secondary | ICD-10-CM

## 2017-11-26 DIAGNOSIS — R059 Cough, unspecified: Secondary | ICD-10-CM

## 2017-11-26 DIAGNOSIS — Z95828 Presence of other vascular implants and grafts: Secondary | ICD-10-CM

## 2017-11-26 DIAGNOSIS — R531 Weakness: Secondary | ICD-10-CM

## 2017-11-26 LAB — COMPREHENSIVE METABOLIC PANEL
ALBUMIN: 3.9 g/dL (ref 3.5–5.0)
ALK PHOS: 60 U/L (ref 38–126)
ALT: 37 U/L (ref 17–63)
ANION GAP: 11 (ref 5–15)
AST: 37 U/L (ref 15–41)
BUN: 23 mg/dL — ABNORMAL HIGH (ref 6–20)
CHLORIDE: 107 mmol/L (ref 101–111)
CO2: 20 mmol/L — AB (ref 22–32)
Calcium: 9 mg/dL (ref 8.9–10.3)
Creatinine, Ser: 1.82 mg/dL — ABNORMAL HIGH (ref 0.61–1.24)
GFR calc Af Amer: 41 mL/min — ABNORMAL LOW (ref >60)
GFR calc non Af Amer: 36 mL/min — ABNORMAL LOW (ref >60)
Glucose, Bld: 102 mg/dL — ABNORMAL HIGH (ref 65–99)
POTASSIUM: 4.4 mmol/L (ref 3.5–5.1)
SODIUM: 138 mmol/L (ref 135–145)
Total Bilirubin: 0.5 mg/dL (ref 0.3–1.2)
Total Protein: 6.4 g/dL — ABNORMAL LOW (ref 6.5–8.1)

## 2017-11-26 LAB — CBC WITH DIFFERENTIAL/PLATELET
Basophils Absolute: 0 10*3/uL (ref 0–0.1)
Basophils Relative: 1 %
Eosinophils Absolute: 0.1 10*3/uL (ref 0–0.7)
Eosinophils Relative: 2 %
HEMATOCRIT: 37.6 % — AB (ref 40.0–52.0)
HEMOGLOBIN: 12.8 g/dL — AB (ref 13.0–18.0)
LYMPHS ABS: 1.1 10*3/uL (ref 1.0–3.6)
LYMPHS PCT: 23 %
MCH: 35 pg — ABNORMAL HIGH (ref 26.0–34.0)
MCHC: 34 g/dL (ref 32.0–36.0)
MCV: 102.8 fL — AB (ref 80.0–100.0)
MONOS PCT: 11 %
Monocytes Absolute: 0.5 10*3/uL (ref 0.2–1.0)
NEUTROS ABS: 2.9 10*3/uL (ref 1.4–6.5)
NEUTROS PCT: 63 %
Platelets: 128 10*3/uL — ABNORMAL LOW (ref 150–440)
RBC: 3.65 MIL/uL — ABNORMAL LOW (ref 4.40–5.90)
RDW: 14.6 % — AB (ref 11.5–14.5)
WBC: 4.5 10*3/uL (ref 3.8–10.6)

## 2017-11-26 LAB — MAGNESIUM: MAGNESIUM: 1.8 mg/dL (ref 1.7–2.4)

## 2017-11-26 MED ORDER — HEPARIN SOD (PORK) LOCK FLUSH 100 UNIT/ML IV SOLN
500.0000 [IU] | Freq: Once | INTRAVENOUS | Status: AC
  Administered 2017-11-26: 500 [IU]

## 2017-11-26 MED ORDER — SODIUM CHLORIDE 0.9 % IV SOLN
INTRAVENOUS | Status: DC
  Administered 2017-11-26: 13:00:00 via INTRAVENOUS
  Filled 2017-11-26 (×2): qty 1000

## 2017-11-26 MED ORDER — SODIUM CHLORIDE 0.9% FLUSH
10.0000 mL | Freq: Once | INTRAVENOUS | Status: AC
  Administered 2017-11-26: 10 mL via INTRAVENOUS
  Filled 2017-11-26: qty 10

## 2017-11-26 NOTE — Telephone Encounter (Signed)
S/O called to report that patient has not felt well in over a week and that he is having low back pain, weakness and non productive cough. Appointment for [facility] added and accepted labs entered per VO Alease Medina, NP

## 2017-11-26 NOTE — Telephone Encounter (Signed)
Patient here today in [facility] Management Clinic.  Received 1 L NS over one hour.  Vitals taken @ completion.  BP 116/72, HR 89.  Patient states he is feeling much better.  Ate crackers and applesauce and had juice to drink during infusion.  Reported back to Beckey Rutter, NP prior to discharge home.

## 2017-11-26 NOTE — Progress Notes (Signed)
Symptom Management Consult note [facility]  [phone] [phone]  Patient Care Team: Logan Hartigan, MD as PCP - General (Family Medicine) Logan Skains, MD as Consulting Physician (Internal Medicine) Logan Sickles, MD as Referring Physician (Hematology and Oncology)   Name of the patient: Logan Perez  [id number]  04-Nov-1945   Date of visit: 11/26/17  Diagnosis- multiple myeloma s/p autologous stem cell transplant and relapse s/p 2nd autologous   Chief complaint/ Reason for visit- URI and weakness  Heme/Onc history: Patient last evaluated by primary oncologist, Dr. Mike Perez, on 11/01/17.  Patient has history of multiple myeloma s/p autologous stemp cell transplant with relapse who is 16 months post 2nd autologous stem cell transplant. Currently s/p cycle 11 Daratumumab.  Initially, he presented with progressive back pain beginning 12/2006.  MRI revealed "spots and compression fractures" and he began Velcade, thalidomide, and Decadron.  In 08/2007 he underwent high-dose chemotherapy and autologous stem cell transplant.  He underwent second autologous stem cell transplant on 06/16/16.  He recurred with a rising M spike (2.7) with repeat M spike (1.7) and 03/2010.  He was initially treated with Velcade (02/08/10-05/10/10).  He began Revlimid and Decadron. Decadron was dose reduced on 07/2010 d/t side effects. Revlimid schedule was adjusted d/t right nipple tenderness. He began Pomalyst on 11/18.16. Bone survey on 10/21/15 revealed increased in subcentimeter lytic lesions in calvarium. Bone mearrow biopsy revealed atypical monoclonal plasma cells estimated at 30-40% marrow cells. Marrow was variably cellular (approximately 45%) with background trilineage hematopoiesis. There was no significant increase in marrow reticulin fibers. Storage iron was present.    He last received Zometa on 11/20/10.  Unfortunately, he suffered osteonecrosis of the jaw.   He developed herpes zoster on 04/2008.  He developed a PE on 05/2013 and was initially on Xarelto but now on Eliquis.  He had an episode of pneumonia that required brief admission.  Hypokalemia with severe lower leg cramps on 08/07/14.  Duplex was negative for clot.  He was treated for C. difficile colitis with Flagyl on 07/30/15.  Has chronic indwelling pain pump.  Received 4 cycles of Pomalyst and Decadron 08/27/15-11/19/15.  At that time, restaging studies document progressive disease, kappa free light chains were increasing, SPEP revealed 0.5 gm/dL, monoclonal protein 1.3 gm/dl.  MUGA on 11/30/15 revealed EF of 46%.  Not a good candidate for Kyprolis.  History of PVCs and A. fib-on Cardizem as needed.  Bone marrow on 02/09/16 revealed no diagnostic morphological evidence of plasma cell myeloma.  Marrow is normocellular to hypercellular marrow for age ranging from 10-40%.  Flow cytometry revealed no definitive evidence of monoclonality.  Cytogenetics are normal. 2nd autologous stem cell transplant on 06/16/16. Course was complicated by an improvement syndrome, septic shock, failure to thrive, and delirium.  He also experienced A. fib with intermittent episodes of RVR requiring beta-blockers.  He is on prophylactic acyclovir for 1 year post transplant.  He has been revaccinated.  Previously discussed with Dr. Mike Perez, shingles vaccine. 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 Workup on 08/23/16 revealed normal studies. History of persistent hypomagnesemia. He has not required magnesium since 10/24/16.   Interval history- Logan Perez presents with a report of symptoms of a URI and diarrhea.  URI symptoms began approximately 1-2 weeks ago. Associated symptoms: congestion, rhinorrhea, and cough. Symptoms unchanged since that time. Nothing seems to make better or worse. Has not tried anything for symptoms. Impacts his daily activities stating he 'just feels  terrible'.  Diarrhea symptoms began  approximately 3 weeks ago and have been intermittent. In past week, symptoms have gradually worsened and now he feels dizzy and weak. Reports poor oral intake.  Nothing seems to make symptoms better or worse. Has not taken anything for symptoms. Has not been evaluated for symptoms.   ECOG FS:1 - Symptomatic but completely ambulatory  Review of systems- Review of Systems  Constitutional: Positive for chills, malaise/fatigue and weight loss. Negative for fever.  HENT: Positive for congestion, hearing loss and sore throat. Negative for ear pain, nosebleeds and sinus pain.   Eyes: Negative for pain, discharge and redness.  Respiratory: Positive for cough and sputum production. Negative for hemoptysis, shortness of breath, wheezing and stridor.   Cardiovascular: Negative.   Gastrointestinal: Positive for abdominal pain and diarrhea. Negative for heartburn, nausea and vomiting.  Genitourinary: Negative.   Musculoskeletal: Positive for back pain.       Pain pump  Skin: Negative.   Neurological: Positive for dizziness, weakness and headaches.  Psychiatric/Behavioral: Negative.      Current treatment- Daratumumab 11/01/17  Allergies  Allergen Reactions  . Azithromycin Diarrhea and Other (See Comments)    Possible cause of C. Diff  . Bee Pollen Other (See Comments)    Sneezing, watery eyes, runny nose  . Pollen Extract Other (See Comments)    Sneezing, watery eyes, runny nose  . Zometa [Zoledronic Acid] Other (See Comments)    ONG- Osteonecrosis of the jaw   . Rivaroxaban Rash     Past Medical History:  Diagnosis Date  . Anxiety   . Atrial fibrillation ([place])   . BPH (benign prostatic hyperplasia)   . Complication of anesthesia    BAD HEADACHE NIGHT OF FIRST CATARACT  . Compression fracture of lumbar vertebra ([place])   . Difficulty voiding   . Dysrhythmia    A FIB  . Elevated PSA   . GERD (gastroesophageal reflux disease)   . Hearing aid worn    bilateral  . History of kidney  stones   . HLD (hyperlipidemia)   . HOH (hard of hearing)   . Hypertension   . Multiple myeloma ([place])   . Neuropathy    feet. 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  . Pain    BACK  . Palpitations   . Pneumonia   . Pulmonary embolism ([place])   . Sepsis ([place])   . Stroke [facility])    TIA, detected on CT scan. pt was unaware     Past Surgical History:  Procedure Laterality Date  . BACK SURGERY  1994  . CATARACT EXTRACTION W/PHACO Right 05/02/2016   Procedure: CATARACT EXTRACTION PHACO AND INTRAOCULAR LENS PLACEMENT (IOC);  Surgeon: Birder Robson, MD;  Location: [facility];  Service: Ophthalmology;  Laterality: Right;  [place] 1.06 AP% 20.6 CDE 13.70 FLUID PACK LOT # [id number]  . CATARACT EXTRACTION W/PHACO Left 05/16/2016   Procedure: CATARACT EXTRACTION PHACO AND INTRAOCULAR LENS PLACEMENT (IOC);  Surgeon: Birder Robson, MD;  Location: [facility];  Service: Ophthalmology;  Laterality: Left;  [place] 01:43 AP% 19.8 CDE 20.45 FLUID PACK LOT #[id number]  . COLONOSCOPY WITH PROPOFOL N/A 03/22/2017   Procedure: COLONOSCOPY WITH PROPOFOL;  Surgeon: Lucilla Lame, MD;  Location: [place];  Service: Endoscopy;  Laterality: N/A;  has port  . ESOPHAGOGASTRODUODENOSCOPY (EGD) WITH PROPOFOL  03/22/2017   Procedure: ESOPHAGOGASTRODUODENOSCOPY (EGD) WITH PROPOFOL;  Surgeon: Lucilla Lame, MD;  Location: [place];  Service: Endoscopy;;  . EYE SURGERY    . KNEE ARTHROSCOPY  Left 1992  . LIMBAL STEM CELL TRANSPLANT    . PAIN PUMP IMPLANTATION  2012  . PAIN PUMP IMPLANTATION N/A 09/04/2017   Procedure: INTRATHECAL PUMP BATTERY CHANGE;  Surgeon: Milinda Pointer, MD;  Location: [facility];  Service: Neurosurgery;  Laterality: N/A;  . PORTA CATH INSERTION N/A 12/04/2016   Procedure: Glori Luis Cath Insertion;  Surgeon: Algernon Huxley, MD;  Location: [place] [facility];  Service: Cardiovascular;  Laterality: N/A;  . stem cell implant  2008   [facility]    Social History   Socioeconomic History  . Marital status: Married     Spouse name: Not on file  . Number of children: Not on file  . Years of education: Not on file  . Highest education level: Not on file  Social Needs  . Financial resource strain: Not on file  . Food insecurity - worry: Not on file  . Food insecurity - inability: Not on file  . Transportation needs - medical: Not on file  . Transportation needs - non-medical: Not on file  Occupational History  . Not on file  Tobacco Use  . Smoking status: Former Smoker    Packs/day: 2.50    Years: 12.00    Pack years: 30.00    Types: Cigarettes    Last attempt to quit: 1977    Years since quitting: 42.1  . Smokeless tobacco: Never Used  . Tobacco comment: Quit 40 years ago  Substance and Sexual Activity  . Alcohol use: No    Alcohol/week: 0.0 oz    Comment: might have 1 beer/month  . Drug use: No  . Sexual activity: Not on file  Other Topics Concern  . Not on file  Social History Narrative  . Not on file    Family History  Problem Relation Age of Onset  . Cancer Father        throat  . Kidney disease Sister   . Stroke Unknown   . Stroke Mother   . Bladder Cancer Neg Hx   . Prostate cancer Neg Hx   . Kidney cancer Neg Hx      Current Outpatient Medications:  .  acetaminophen (TYLENOL) 325 MG tablet, Take 650 mg every 6 (six) hours as needed by mouth for mild pain or headache. , Disp: , Rfl:  .  ALPRAZolam (XANAX) 0.25 MG tablet, TAKE 1 TABLET BY MOUTH EVERY NIGHT AT BEDTIME PATCH, Disp: 30 tablet, Rfl: 0 .  apixaban (ELIQUIS) 5 MG TABS tablet, Take 1 tablet (5 mg total) by mouth 2 (two) times daily., Disp: 60 tablet, Rfl: 5 .  baclofen (LIORESAL) 10 MG tablet, Take 1 tablet (10 mg total) by mouth at bedtime., Disp: 30 tablet, Rfl: 5 .  Calcium Carb-Cholecalciferol (CALCIUM-VITAMIN D) 500-400 MG-UNIT TABS, Take 1 tablet daily by mouth., Disp: , Rfl:  .  cetirizine (ZYRTEC) 10 MG tablet, Take 10 mg by mouth daily as needed for allergies. , Disp: , Rfl:  .  Cholecalciferol (VITAMIN  D3) 2000 units capsule, Take 2,000 Units daily by mouth. , Disp: , Rfl:  .  Daratumumab (DARZALEX IV), Inject 1,200 mg every 30 (thirty) days into the vein. , Disp: , Rfl:  .  dexamethasone (DECADRON) 4 MG tablet, Take 4 tabs PO 1 hr prior to infusion, then take 1 tab on day 2 and day 3, Disp: 30 tablet, Rfl: 1 .  diltiazem (CARDIZEM CD) 120 MG 24 hr capsule, Take 120 mg daily by mouth. , Disp: , Rfl:  .  diltiazem (CARDIZEM) 60 MG tablet, Take 60 mg daily as needed by mouth (for increased heart rate > 140). , Disp: , Rfl:  .  dimenhyDRINATE (DRAMAMINE) 50 MG tablet, Take 50 mg by mouth every 8 (eight) hours as needed., Disp: , Rfl:  .  diphenhydrAMINE (BENADRYL) 25 MG tablet, Take 25 mg by mouth as directed. With chemo treatment, Disp: , Rfl:  .  ENSURE (ENSURE), Take 237 mLs by mouth daily., Disp: , Rfl:  .  Hypromellose (ARTIFICIAL TEARS OP), Place 1 drop as needed into both eyes (for dry eyes)., Disp: , Rfl:  .  loperamide (IMODIUM) 2 MG capsule, Take 4 mg as needed by mouth for diarrhea or loose stools. , Disp: , Rfl:  .  lovastatin (MEVACOR) 20 MG tablet, Take 20 mg by mouth every evening. , Disp: , Rfl:  .  montelukast (SINGULAIR) 10 MG tablet, TAKE 1 TABLET BY MOUTH ONCE A DAY AS DIRECTED. (Patient taking differently: Take 27ms daily the day before the infusion, the day of, and the 2 days after infusion), Disp: 30 tablet, Rfl: 0 .  Multiple Vitamins-Iron (MULTIVITAMIN/IRON PO), Take 1 tablet daily by mouth., Disp: , Rfl:  .  omeprazole (PRILOSEC) 20 MG capsule, Take 20 mg by mouth daily. , Disp: , Rfl:  .  ondansetron (ZOFRAN) 8 MG tablet, Take 8 mg by mouth as directed. Take with chemo and can take it as needed, Disp: , Rfl:  .  PAIN MANAGEMENT IT PUMP REFILL, 1 each once for 1 dose by Intrathecal route. Medication: PF Fentanyl 1,500.0 mcg/ml PF Bupivicaine 30.0 mg/ml PF Clonidine 300.0 mcg/ml Total Volume: 40 ml Needed by 09-04-17 @ 1000  Please deliver to the OR, Disp: 1 each, Rfl: 0 .   potassium chloride SA (K-DUR,KLOR-CON) 20 MEQ tablet, TAKE 1 TABLET BY MOUTH DAILY, Disp: 90 tablet, Rfl: 0 .  ranitidine (ZANTAC) 150 MG tablet, Take 150 mg by mouth at bedtime. , Disp: , Rfl:  .  tamsulosin (FLOMAX) 0.4 MG CAPS capsule, Take 1 capsule (0.4 mg total) by mouth daily., Disp: 90 capsule, Rfl: 4 .  valACYclovir (VALTREX) 500 MG tablet, TAKE 1 TABLET BY MOUTH DAILY, Disp: 30 tablet, Rfl: 3 .  vitamin B-12 (CYANOCOBALAMIN) 1000 MCG tablet, Take 1,000 mcg daily by mouth. , Disp: , Rfl:  .  fluticasone (FLONASE) 50 MCG/ACT nasal spray, Place 1 spray into the nose daily as needed for allergies. , Disp: , Rfl:   Physical exam:  Vitals:   11/26/17 1144  BP: 117/87  Pulse: (!) 108  Resp: 18  Temp: 99.4 F (37.4 C)  TempSrc: Tympanic  SpO2: 98%  Weight: 162 lb (73.5 kg)  Height: '5\' 7"'  (1.702 m)    GENERAL:  Ill appearing elderly gentleman. No acute distress.   MENTAL STATUS:  Alert and oriented to person, place and time. HEENT: Normocephalic, atrumatic. Glasses. PERRL. No conjunctivitis or scleral icterus. Hearing aid. Oropharynx clear. Rhinorrhea. No mouth sores. Turbinates inflamed.  RESPIRATORY:  Clear to auscultation without rales, wheezes or rhonchi. CARDIOVASCULAR:  Regular rate and rhythm without murmur, rub or gallop. ABDOMEN:  RUQ pain pump.  Soft, non-tender, with active bowel sounds, and no hepatosplenomegaly.  No masses. SKIN: No rashes, ulcers or lesions. EXTREMITIES: No edema, skin discoloration or tenderness.  LYMPH NODES: No palpable cervical, supraclavicular, axillary adenopathy  NEUROLOGICAL: Slight intention tremor. PSYCH:  Appropriate.    CMP Latest Ref Rng & Units 11/26/2017  Glucose 65 - 99 mg/dL 102(H)  BUN 6 - 20  mg/dL 23(H)  Creatinine 0.61 - 1.24 mg/dL 1.82(H)  Sodium 135 - 145 mmol/L 138  Potassium 3.5 - 5.1 mmol/L 4.4  Chloride 101 - 111 mmol/L 107  CO2 22 - 32 mmol/L 20(L)  Calcium 8.9 - 10.3 mg/dL 9.0  Total Protein 6.5 - 8.1 g/dL 6.4(L)    Total Bilirubin 0.3 - 1.2 mg/dL 0.5  Alkaline Phos 38 - 126 U/L 60  AST 15 - 41 U/L 37  ALT 17 - 63 U/L 37   CBC Latest Ref Rng & Units 11/26/2017  WBC 3.8 - 10.6 K/uL 4.5  Hemoglobin 13.0 - 18.0 g/dL 12.8(L)  Hematocrit 40.0 - 52.0 % 37.6(L)  Platelets 150 - 440 K/uL 128(L)    Assessment and plan- Patient is a 72 y.o. male who presents to Symptom management Clinic for URI and Diarrhea.   1. Stage III multiple myeloma- s/p 2nd autologous stem cell transplant on 06/16/16 followed by 11 cycles of Daratumumab 12/05/16-11/01/17.  Recent M-spike 0.1 gm/dL. Follow up with Dr. Mike Perez for repeat labs and consideration of cycle 12 of daratumumab on 11/29/17.   2. Diarrhea- Imodium at home. Today, dizzy, weak, Cr 1.82. IV fluids in clinic today and trial fluids and solids. Encouraged control of symptoms to avoid dehydration and abnormal electrolytes. Could consider Lomotil if symptoms unresolved with Imodium. Could consider additional IV fluids later this week if continued weakness and/or poor oral intake.   3. URI symptoms- likely viral related. Poor oral intake. Discussed OTC medications such as flonase and mucinex. Afebrile. If symptoms worsen or febrile would consider antibiotics. Continue to monitor at this time.    F/u with Dr. Mike Perez on 11/29/17 as previously scheduled.    Visit Diagnosis 1. Multiple myeloma, remission status unspecified ([place])   2. Diarrhea, unspecified type   3. Viral upper respiratory tract infection    A total of (30) minutes of face-to-face time was spent with this patient with greater than 50% of that time in counseling and care-coordination.  Patient expressed understanding and was in agreement with this plan. He also understands that He can call clinic at any time with any questions, concerns, or complaints. Patient advised to notify the clinic if there is no improvement in symptoms or if symptoms worsen in next 3-4 days.    Beckey Rutter, DNP, AGNP-C [place]  at [facility] [phone] (office) 12/03/17 3:46 PM

## 2017-11-26 NOTE — Progress Notes (Signed)
Pt had diarrhea a couple of days ago/ took imodium and it got better. No energy. Unsteady at home. Back pain on pain pump. Low grade fever like 99 and today 99.4. Coughing and sputum production yellow. Drinking water. He feels unsteady on his feet at home-the patient agrees with wife statement

## 2017-11-29 ENCOUNTER — Inpatient Hospital Stay: Payer: Medicare Other

## 2017-11-29 ENCOUNTER — Inpatient Hospital Stay (HOSPITAL_BASED_OUTPATIENT_CLINIC_OR_DEPARTMENT_OTHER): Payer: Medicare Other | Admitting: Hematology and Oncology

## 2017-11-29 ENCOUNTER — Encounter: Payer: Self-pay | Admitting: Hematology and Oncology

## 2017-11-29 ENCOUNTER — Other Ambulatory Visit: Payer: Self-pay

## 2017-11-29 VITALS — BP 143/97 | HR 101 | Temp 95.9°F | Resp 20 | Wt 163.4 lb

## 2017-11-29 DIAGNOSIS — C9001 Multiple myeloma in remission: Secondary | ICD-10-CM

## 2017-11-29 DIAGNOSIS — Z7901 Long term (current) use of anticoagulants: Secondary | ICD-10-CM

## 2017-11-29 DIAGNOSIS — D539 Nutritional anemia, unspecified: Secondary | ICD-10-CM

## 2017-11-29 DIAGNOSIS — Z5112 Encounter for antineoplastic immunotherapy: Secondary | ICD-10-CM

## 2017-11-29 DIAGNOSIS — Z86711 Personal history of pulmonary embolism: Secondary | ICD-10-CM

## 2017-11-29 DIAGNOSIS — Z9484 Stem cells transplant status: Secondary | ICD-10-CM | POA: Diagnosis not present

## 2017-11-29 DIAGNOSIS — C9002 Multiple myeloma in relapse: Secondary | ICD-10-CM

## 2017-11-29 DIAGNOSIS — R197 Diarrhea, unspecified: Secondary | ICD-10-CM

## 2017-11-29 LAB — CBC WITH DIFFERENTIAL/PLATELET
Basophils Absolute: 0 10*3/uL (ref 0–0.1)
Basophils Relative: 0 %
Eosinophils Absolute: 0.1 10*3/uL (ref 0–0.7)
Eosinophils Relative: 2 %
HCT: 35.8 % — ABNORMAL LOW (ref 40.0–52.0)
Hemoglobin: 12.5 g/dL — ABNORMAL LOW (ref 13.0–18.0)
Lymphocytes Relative: 28 %
Lymphs Abs: 1.5 10*3/uL (ref 1.0–3.6)
MCH: 35.5 pg — ABNORMAL HIGH (ref 26.0–34.0)
MCHC: 34.8 g/dL (ref 32.0–36.0)
MCV: 102 fL — ABNORMAL HIGH (ref 80.0–100.0)
Monocytes Absolute: 0.6 10*3/uL (ref 0.2–1.0)
Monocytes Relative: 11 %
Neutro Abs: 3.1 10*3/uL (ref 1.4–6.5)
Neutrophils Relative %: 59 %
Platelets: 146 10*3/uL — ABNORMAL LOW (ref 150–440)
RBC: 3.51 MIL/uL — ABNORMAL LOW (ref 4.40–5.90)
RDW: 14.4 % (ref 11.5–14.5)
WBC: 5.3 10*3/uL (ref 3.8–10.6)

## 2017-11-29 LAB — COMPREHENSIVE METABOLIC PANEL
ALT: 26 U/L (ref 17–63)
AST: 35 U/L (ref 15–41)
Albumin: 3.8 g/dL (ref 3.5–5.0)
Alkaline Phosphatase: 60 U/L (ref 38–126)
Anion gap: 8 (ref 5–15)
BUN: 18 mg/dL (ref 6–20)
CO2: 23 mmol/L (ref 22–32)
Calcium: 9 mg/dL (ref 8.9–10.3)
Chloride: 106 mmol/L (ref 101–111)
Creatinine, Ser: 1.51 mg/dL — ABNORMAL HIGH (ref 0.61–1.24)
GFR calc Af Amer: 52 mL/min — ABNORMAL LOW (ref >60)
GFR calc non Af Amer: 45 mL/min — ABNORMAL LOW (ref >60)
Glucose, Bld: 135 mg/dL — ABNORMAL HIGH (ref 65–99)
Potassium: 4.1 mmol/L (ref 3.5–5.1)
Sodium: 137 mmol/L (ref 135–145)
Total Bilirubin: 0.5 mg/dL (ref 0.3–1.2)
Total Protein: 6.3 g/dL — ABNORMAL LOW (ref 6.5–8.1)

## 2017-11-29 LAB — MAGNESIUM: Magnesium: 1.7 mg/dL (ref 1.7–2.4)

## 2017-11-29 MED ORDER — ACETAMINOPHEN 325 MG PO TABS
650.0000 mg | ORAL_TABLET | Freq: Once | ORAL | Status: DC
  Filled 2017-11-29: qty 2

## 2017-11-29 MED ORDER — SODIUM CHLORIDE 0.9 % IV SOLN
1200.0000 mg | Freq: Once | INTRAVENOUS | Status: AC
  Administered 2017-11-29: 1200 mg via INTRAVENOUS
  Filled 2017-11-29: qty 60

## 2017-11-29 MED ORDER — PAIN MANAGEMENT IT PUMP REFILL: 1.0000 | Freq: Once | INTRATHECAL | 0 refills | Status: DC

## 2017-11-29 MED ORDER — SODIUM CHLORIDE 0.9 % IV SOLN
Freq: Once | INTRAVENOUS | Status: AC
  Administered 2017-11-29: 10:00:00 via INTRAVENOUS
  Filled 2017-11-29: qty 1000

## 2017-11-29 MED ORDER — HEPARIN SOD (PORK) LOCK FLUSH 100 UNIT/ML IV SOLN
500.0000 [IU] | Freq: Once | INTRAVENOUS | Status: AC
  Administered 2017-11-29: 500 [IU] via INTRAVENOUS

## 2017-11-29 MED ORDER — ONDANSETRON 8 MG PO TBDP
8.0000 mg | ORAL_TABLET | Freq: Once | ORAL | Status: DC
  Filled 2017-11-29: qty 1

## 2017-11-29 MED ORDER — DIPHENHYDRAMINE HCL 25 MG PO CAPS
50.0000 mg | ORAL_CAPSULE | Freq: Once | ORAL | Status: DC
  Filled 2017-11-29: qty 2

## 2017-11-29 NOTE — Progress Notes (Signed)
Reviewed Vital signs, patient receiving treatment per Honor Loh, NP/Dr. Mike Gip.

## 2017-11-29 NOTE — Progress Notes (Signed)
[facility] day:  11/29/17  Chief Complaint: Logan Perez is a 72 y.o. 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   HPI: The patient was last seen in the medical oncology clinic on 11/01/2017.  At that time, he felt good.  Exam was stable.  WBC is 7700 with an [place] of 4900. Hemoglobin is 12.5, hematocrit 35.8, platelets 205,000.  Creatinine is 1.51 (CrCl 42 mL/min).  He received cycle #11 daratumumab.  He saw Beckey Rutter, NP on 11/26/2017 for a sick call visit.  He complained of cough, congestion, and diarrhea.  He was dizzy and weak.  Oral intake was poor. Creatinine had increased from 1.51 to 1.82.  He received IVF. Patient notes that he improved significantly following the the IVFs.   During the interim, patient is feeling better following his acute illness earlier in the week.  He said it was like "flipping a switch" after the IVF.  Patient notes chronic diarrhea. Patient has increased his oral fluid intake and has continued to improve. He denies fevers, sweats, and weight loss. Patient's weight up 1 pound.   Patient denies pain in the clinic today.    Past Medical History:  Diagnosis Date  . Anxiety   . Atrial fibrillation ([place])   . BPH (benign prostatic hyperplasia)   . Complication of anesthesia    BAD HEADACHE NIGHT OF FIRST CATARACT  . Compression fracture of lumbar vertebra ([place])   . Difficulty voiding   . Dysrhythmia    A FIB  . Elevated PSA   . GERD (gastroesophageal reflux disease)   . Hearing aid worn    bilateral  . History of kidney stones   . HLD (hyperlipidemia)   . HOH (hard of hearing)   . Hypertension   . Multiple myeloma ([place])   . Neuropathy    feet. 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  . Pain    BACK  . Palpitations   . Pneumonia   . Pulmonary embolism ([place])    . Sepsis ([place])   . Stroke [facility])    TIA, detected on CT scan. pt was unaware    Past Surgical History:  Procedure Laterality Date  . BACK SURGERY  1994  . CATARACT EXTRACTION W/PHACO Right 05/02/2016   Procedure: CATARACT EXTRACTION PHACO AND INTRAOCULAR LENS PLACEMENT (IOC);  Surgeon: Birder Robson, MD;  Location: [facility] ORS;  Service: Ophthalmology;  Laterality: Right;  [place] 1.06 AP% 20.6 CDE 13.70 FLUID PACK LOT # [id number]  . CATARACT EXTRACTION W/PHACO Left 05/16/2016   Procedure: CATARACT EXTRACTION PHACO AND INTRAOCULAR LENS PLACEMENT (IOC);  Surgeon: Birder Robson, MD;  Location: [facility] ORS;  Service: Ophthalmology;  Laterality: Left;  [place] 01:43 AP% 19.8 CDE 20.45 FLUID PACK LOT #[id number] H  . COLONOSCOPY WITH PROPOFOL N/A 03/22/2017   Procedure: COLONOSCOPY WITH PROPOFOL;  Surgeon: Lucilla Lame, MD;  Location: [place];  Service: Endoscopy;  Laterality: N/A;  has port  . ESOPHAGOGASTRODUODENOSCOPY (EGD) WITH PROPOFOL  03/22/2017   Procedure: ESOPHAGOGASTRODUODENOSCOPY (EGD) WITH PROPOFOL;  Surgeon: Lucilla Lame, MD;  Location: [place];  Service: Endoscopy;;  . EYE SURGERY    . KNEE ARTHROSCOPY Left 1992  . LIMBAL STEM CELL TRANSPLANT    . PAIN PUMP IMPLANTATION  2012  . PAIN PUMP IMPLANTATION N/A 09/04/2017   Procedure: INTRATHECAL PUMP BATTERY CHANGE;  Surgeon: Milinda Pointer, MD;  Location: [facility] ORS;  Service: Neurosurgery;  Laterality: N/A;  . PORTA CATH INSERTION N/A 12/04/2016   Procedure: Glori Luis Cath Insertion;  Surgeon: Algernon Huxley, MD;  Location: [place] [facility] LAB;  Service: Cardiovascular;  Laterality: N/A;  . stem cell implant  2008   [facility]    Family History  Problem Relation Age of Onset  . Cancer Father        throat  . Kidney disease Sister   . Stroke Unknown   . Stroke Mother   . Bladder Cancer Neg Hx   . Prostate cancer Neg Hx   . Kidney cancer Neg Hx     Social History:  reports that he quit smoking about 42 years ago. His  smoking use included cigarettes. He has a 30.00 pack-year smoking history. he has never used smokeless tobacco. He reports that he does not drink alcohol or use drugs.  He has a wire haired dachshund.  He lives in [place].  His wife's name is [name].  The patient is alone today.  Allergies:  Allergies  Allergen Reactions  . Azithromycin Diarrhea and Other (See Comments)    Possible cause of C. Diff  . Bee Pollen Other (See Comments)    Sneezing, watery eyes, runny nose  . Pollen Extract Other (See Comments)    Sneezing, watery eyes, runny nose  . Zometa [Zoledronic Acid] Other (See Comments)    ONG- Osteonecrosis of the jaw   . Rivaroxaban Rash    Current Medications: Current Outpatient Medications  Medication Sig Dispense Refill  . acetaminophen (TYLENOL) 325 MG tablet Take 650 mg every 6 (six) hours as needed by mouth for mild pain or headache.     . ALPRAZolam (XANAX) 0.25 MG tablet TAKE 1 TABLET BY MOUTH EVERY NIGHT AT BEDTIME PATCH 30 tablet 0  . apixaban (ELIQUIS) 5 MG TABS tablet Take 1 tablet (5 mg total) by mouth 2 (two) times daily. 60 tablet 5  . baclofen (LIORESAL) 10 MG tablet Take 1 tablet (10 mg total) by mouth at bedtime. 30 tablet 5  . Calcium Carb-Cholecalciferol (CALCIUM-VITAMIN D) 500-400 MG-UNIT TABS Take 1 tablet daily by mouth.    . cetirizine (ZYRTEC) 10 MG tablet Take 10 mg by mouth daily as needed for allergies.     . Cholecalciferol (VITAMIN D3) 2000 units capsule Take 2,000 Units daily by mouth.     . Daratumumab (DARZALEX IV) Inject 1,200 mg every 30 (thirty) days into the vein.     [name] dexamethasone (DECADRON) 4 MG tablet Take 4 tabs PO 1 hr prior to infusion, then take 1 tab on day 2 and day 3 30 tablet 1  . diltiazem (CARDIZEM CD) 120 MG 24 hr capsule Take 120 mg daily by mouth.     . diltiazem (CARDIZEM) 60 MG tablet Take 60 mg daily as needed by mouth (for increased heart rate > 140).     [name] dimenhyDRINATE (DRAMAMINE) 50 MG tablet Take 50 mg by mouth every  8 (eight) hours as needed.    . diphenhydrAMINE (BENADRYL) 25 MG tablet Take 25 mg by mouth as directed. With chemo treatment    . ENSURE (ENSURE) Take 237 mLs by mouth daily.    . fluticasone (FLONASE) 50 MCG/ACT nasal spray Place 1 spray into the nose daily as needed for allergies.     . Hypromellose (ARTIFICIAL TEARS OP) Place 1 drop as needed into both eyes (for dry eyes).    [name] loperamide (  IMODIUM) 2 MG capsule Take 4 mg as needed by mouth for diarrhea or loose stools.     . lovastatin (MEVACOR) 20 MG tablet Take 20 mg by mouth every evening.     . montelukast (SINGULAIR) 10 MG tablet TAKE 1 TABLET BY MOUTH ONCE A DAY AS DIRECTED. (Patient taking differently: Take 76ms daily the day before the infusion, the day of, and the 2 days after infusion) 30 tablet 0  . Multiple Vitamins-Iron (MULTIVITAMIN/IRON PO) Take 1 tablet daily by mouth.    .Marland Kitchenomeprazole (PRILOSEC) 20 MG capsule Take 20 mg by mouth daily.     . ondansetron (ZOFRAN) 8 MG tablet Take 8 mg by mouth as directed. Take with chemo and can take it as needed    . PAIN MANAGEMENT IT PUMP REFILL 1 each once for 1 dose by Intrathecal route. Medication: PF Fentanyl 1,500.0 mcg/ml PF Bupivicaine 30.0 mg/ml PF Clonidine 300.0 mcg/ml Total Volume: 40 ml Needed by 09-04-17 @ 1000  Please deliver to the OR 1 each 0  . potassium chloride SA (K-DUR,KLOR-CON) 20 MEQ tablet TAKE 1 TABLET BY MOUTH DAILY 90 tablet 0  . ranitidine (ZANTAC) 150 MG tablet Take 150 mg by mouth at bedtime.     . tamsulosin (FLOMAX) 0.4 MG CAPS capsule Take 1 capsule (0.4 mg total) by mouth daily. 90 capsule 4  . valACYclovir (VALTREX) 500 MG tablet TAKE 1 TABLET BY MOUTH DAILY 30 tablet 3  . vitamin B-12 (CYANOCOBALAMIN) 1000 MCG tablet Take 1,000 mcg daily by mouth.      No current facility-administered medications for this visit.     Review of Systems:  GENERAL: Feels "better".  No fevers or sweats.  Weight up 1 pound. PERFORMANCE STATUS (ECOG): 1 HEENT:   Cataracts s/p surgery.  No sore throat, mouth sores or tenderness. Lungs:  No shortness of breath.  No cough.  No hemoptysis. Cardiac:  No chest pain, palpitations, orthopnea, or PND. GI:  Appetite good. Diarrhea, well managed.  No vomiting, constipation, melena or hematochezia. GU:  No urgency, frequency, dysuria, or hematuria. Musculoskeletal:  Compression fracture of back on a pain pump. No muscle tenderness. Extremities:  No pain or swelling. Skin:  s/p Moh's surgery.  Fragile skin.  No rashes or skin changes. Neuro: Neuropathy in feet.  Sensitivity in hands.  No headache, weakness, balance or coordination issues. Endocrine:  No diabetes, thyroid issues, hot flashes or night sweats. Psych:  No mood changes, depression or anxiety. Pain:  Chronic back pain (pain well managed with pump). 0/10 pain today.  Review of systems:  All other systems reviewed and found to be negative.   Physical Exam: Blood pressure (!) 143/97, pulse (!) 101, temperature (!) 95.9 F (35.5 C), temperature source Tympanic, resp. rate 20, weight 163 lb 6 oz (74.1 kg). GENERAL:  Well developed, well nourished gentleman sitting comfortably in the exam room in no acute distress.  MENTAL STATUS:  Alert and oriented to person, place and time. HEAD:  Short brown hair.  Graying goatee.  Normocephalic, atraumatic, face symmetric, no Cushingoid features. EYES:  Glasses.  Blue eyes s/p cataract surgery.  Pupils equal round and reactive to light and accomodation.  No conjunctivitis or scleral icterus. ENT:  Hearing aide.  Oropharynx clear without lesion.  Tongue normal. Mucous membranes moist.  RESPIRATORY:  Clear to auscultation without rales, wheezes or rhonchi. CARDIOVASCULAR:  Regular rate and rhythm without murmur, rub or gallop. ABDOMEN:  RUQ pain pump.  Soft, non-tender, with active bowel  sounds, and no hepatosplenomegaly.  No masses. SKIN:  Small area of transplanted skin on tip of nose s/p Moh's surgery.  No rashes,  ulcers or lesions. EXTREMITIES: No edema, skin discoloration or tenderness.  No palpable cords. LYMPH NODES: No palpable cervical, supraclavicular, axillary or inguinal adenopathy  NEUROLOGICAL: Slight intention tremor. PSYCH:  Appropriate.    Appointment on 11/29/2017  Component Date Value Ref Range Status  . Magnesium 11/29/2017 1.7  1.7 - 2.4 mg/dL Final   Performed at [facility], [address], [place], [place] [address]  . Sodium 11/29/2017 137  135 - 145 mmol/L Final  . Potassium 11/29/2017 4.1  3.5 - 5.1 mmol/L Final  . Chloride 11/29/2017 106  101 - 111 mmol/L Final  . CO2 11/29/2017 23  22 - 32 mmol/L Final  . Glucose, Bld 11/29/2017 135* 65 - 99 mg/dL Final  . BUN 11/29/2017 18  6 - 20 mg/dL Final  . Creatinine, Ser 11/29/2017 1.51* 0.61 - 1.24 mg/dL Final  . Calcium 11/29/2017 9.0  8.9 - 10.3 mg/dL Final  . Total Protein 11/29/2017 6.3* 6.5 - 8.1 g/dL Final  . Albumin 11/29/2017 3.8  3.5 - 5.0 g/dL Final  . AST 11/29/2017 35  15 - 41 U/L Final  . ALT 11/29/2017 26  17 - 63 U/L Final  . Alkaline Phosphatase 11/29/2017 60  38 - 126 U/L Final  . Total Bilirubin 11/29/2017 0.5  0.3 - 1.2 mg/dL Final  . GFR calc non Af Amer 11/29/2017 45* >60 mL/min Final  . GFR calc Af Amer 11/29/2017 52* >60 mL/min Final   Comment: (NOTE) The eGFR has been calculated using the CKD EPI equation. This calculation has not been validated in all clinical situations. eGFR's persistently <60 mL/min signify possible Chronic Kidney Disease.   Georgiann Hahn gap 11/29/2017 8  5 - 15 Final   Performed at [facility], [address] [place], [place] [address]  . WBC 11/29/2017 5.3  3.8 - 10.6 K/uL Final  . RBC 11/29/2017 3.51* 4.40 - 5.90 MIL/uL Final  . Hemoglobin 11/29/2017 12.5* 13.0 - 18.0 g/dL Final  . HCT 11/29/2017 35.8* 40.0 - 52.0 % Final  . MCV 11/29/2017 102.0* 80.0 - 100.0 fL Final  . MCH 11/29/2017 35.5* 26.0 - 34.0 pg Final  . MCHC 11/29/2017 34.8  32.0 - 36.0 g/dL Final   . RDW 11/29/2017 14.4  11.5 - 14.5 % Final  . Platelets 11/29/2017 146* 150 - 440 K/uL Final  . Neutrophils Relative % 11/29/2017 59  % Final  . Neutro Abs 11/29/2017 3.1  1.4 - 6.5 K/uL Final  . Lymphocytes Relative 11/29/2017 28  % Final  . Lymphs Abs 11/29/2017 1.5  1.0 - 3.6 K/uL Final  . Monocytes Relative 11/29/2017 11  % Final  . Monocytes Absolute 11/29/2017 0.6  0.2 - 1.0 K/uL Final  . Eosinophils Relative 11/29/2017 2  % Final  . Eosinophils Absolute 11/29/2017 0.1  0 - 0.7 K/uL Final  . Basophils Relative 11/29/2017 0  % Final  . Basophils Absolute 11/29/2017 0.0  0 - 0.1 K/uL Final   Performed at [facility], [address], [place],  [address]    Assessment:  Logan Perez is a 72 y.o. male with stage III mutiple myeloma.  He initially presented with progressive back pain beginning in 12/2006.  MRI revealed "spots and compression fractures".  He began Velcade, thalidomide, and Decadron.  In 08/2007, he underwent high dose chemotherapy and autologous  stem cell transplant.  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  He recurred with a rising M-spike (2.7) with repeat M spike (1.7 gm/dl) in 03/2010.  He was initially treated with Velcade (02/08/2010 - 05/10/2010).  He then began Revlimid (15 mg 3 weeks on/1 week off) and Decadron (40 mg on day 1, 8, 15, 22).  Because of significant side effect with Decadron his dose was decreased to 10 mg once a week in 07/2010.    He was on maintenance Revlimid. Revlimid was initially10 mg 3 weeks on/1 week off. This was changed to 10 mg 2 weeks on/2 weeks off secondary to right nipple tenderness. His dose was increased to 10 mg 3 weeks on/1 week off with Decadron 10 mg a week (on Sundays) and then Revlamid 15 mg 3 weeks on and 1 week off with Decadron on Sundays.  He began Pomalyst 4 mg 3 weeks on/1 week off with Decadron on 08/27/2015.  Over the past year his SPEP has revealed no monoclonal protein  (04/21/2015) and 0.5 gm/dL on 09/22/2015 and 10/20/2015. M spike was 0.1 on 02/02/2016, 03/01/2016, 03/29/2016, 04/26/2016, and 05/24/2016.  M spike was 0 on 10/11/2016, 11/28/2016, 02/05/2017, 03/21/2017, 08/02/2017, and 10/04/2017. M spike was 0.1 on 11/01/2017.   Free light chains have been monitored. Kappa free light chains were 18.54 on 11/21/2013, 18.37 on 02/20/2014, 18.93 on 05/22/2014, 32.58 (high; normal ratio 1.27) on 08/21/2014, 51.53 (high; elevated ratio of 2.12) on 11/13/2014, 28.08 (ratio 1.73) on 12/09/2014, 23.71 (ratio 2.17) on 01/18/2015, 92.93 (ratio 9.49) on 04/21/2015, 93.44 (ratio 10.28) on 05/26/2015, 255.45 (ratio of 24.05) on 07/14/2015, 373.89 (ratio 48.31) on 08/04/2015, 474.33 (ratio 70.58) on 08/25/2015, 450.76 (ratio 55.44) on 09/22/2015, 453.4 (ratio 59.89) on 10/20/2015, 58.26 (ratio of > 40.74) on 02/02/2016, 62.67 (ratio of 37.98) on 03/01/2016, 75.7 (ratio > 50.47) on 03/29/2016, 79.7 (ratio 53.13) on 04/26/2016, 108.6 (ratio > 72.4) on 05/24/2016, 8.3 (1.46 ratio) on 10/11/2016, 6.7 (0.81 ratio) on 02/05/2017, 7.8 (1.01 ratio) on 03/21/2017,7.6 (ratio 0.63) on 06/01/2017, and 8.1 (ratio 1.13) on 11/29/2017.  Bone survey on 12/08/2014 was stable.  Bone survey on 10/21/2015 revealed increase conspicuity of subcentimeter lytic lesions in the calvarium.  Bone marrow aspirate and biopsy on 11/04/2015 revealed an atypical monoclonal plasma cells estimated at 30-40% of marrow cells.   Marrow was variably cellular (approximately 45%) with background trilineage hematopoiesis. There was no significant increase in marrow reticulin fibers. Storage iron was present.    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 He develoed herpes zoster in 04/2008. He developed a pulmonary embolism in 05/2013. He was initially on Xarelto, but is now on Eliquis. He had an episode of pneumonia around this time requiring a brief admission. He  developed severe lower leg cramps on 08/07/2014 secondary to hypokalemia. Duplex was negative.   He was treated for C difficile colitis (Flagyl completed 07/30/2015).  He has a chronic indwelling pain pump.  He received 4 cycles of Pomalyst and Decadron (08/27/2015 - 11/19/2015).  Restaging studies document progressive disease.  Kappa free light chains are increasing.  SPEP revealed 0.5 gm/dL monoclonal protein then 1.3 gm/dL.  Bone survey reveals increase conspicuity of subcentimeter lytic lesions in the calvarium.  Bone marrow reveals 30-40% plasma cells.   MUGA on 11/30/2015 revealed an ejection fraction of 46%.  He is felt not to be a good candidate for Kyprolis.  There were no focal wall motion abnormalities.  He had a  stress echo less than 1 year ago.  He has a history of PVCs and atrial fibrillation.  He takes Cardizem prn.  He received 17 weeks of daratumumab (Darzalex) (12/09/2015 - 05/25/2016).  He tolerated treatment well without side effect.  Bone marrow on 02/09/2016 revealed no diagnostic morphologic evidence of plasma cell myeloma.  Marrow was normocellular to hypocellular marrow for age (ranging from 10-40%) with maturing trilineage hematopoiesis and mild multilineage dyspoiesis.  There was patchy mild increase in reticulin.  Storage iron was present.  Flow cytometry revealed no definitive evidence of monoclonality.  There was a non-specific atypical myeloid and monocytic findings with no increase in blasts.  Cytogenetics were normal (46, XY).  He is currently 17 months s/p 2nd autologous stem cell transplant on 06/16/2016.  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  He also experienced atrial fibrillation with intermittent episodes of RVR requiring IV beta blockers.  He is on prophylactic valacyclovir for 1 year post transplant.  He started vaccinations (DTaP-Pediatric triple vaccine, Hep B- Pediatrix triple vaccine, Haemophilus  influenza B (Hib), inactivated polio virus (IPV), pneumococcal conjugate vaccine 13-valent (PCV 13)) on 04/17/2017.  Recommendation was for Shingrix vaccine to be given in [place] and second dose of Shingrix in 2 months.  He had follow-up vaccinations on 08/28/2017.    He is s/p 11 cycles of Daratumumab (Darzalex) post transplant (12/05/2016 - 11/01/2017).  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  Work-up on 08/23/2016 revealed the following normal studies:  B12 and folate.  Ferritin was 1379 (high).  Iron studies included a saturation of 56% and TIBC 141 (low).  ESR was 64.  Thrombocytopenia has resolved.  He has a history of persistent hypomagnesemia and receives IV magnesium (2-4 gm) weekly.  He has not required magnesium since 10/24/2016.  Symptomatically, he feels "better".  Exam is stable.  WBC is 5300 with an [place] of 3100. Hemoglobin is 12.5, hematocrit 35.8, platelets 146,000.  Creatinine is 1.51 (CrCl 42 mL/min)  Plan:  1.  Labs today:  CBC with diff, CMP, Mg, SPEP, FLCA. 2.  Discuss blip in M-spike last month (0.1 gm/dL).  Continue current treatment.  Contact patient with this month's M-spike. 3.  Patient to take own premeds from home:  Ondansetron (8 mg), Tylenol (650 mg), and Benadryl (50 mg). 4.  Blood counts stable and adequate for treatment. Cycle #12 daratumumab today. 5.  Continue Eliquis 5 mg BID as previously scheduled.  6.  RTC in 4 weeks for MD assessment, labs (CBC with diff, CMP, Mg, SPEP), and cycle #13 daratumumab.   Honor Loh, NP 11/29/2017 8:55 AM  I saw and evaluated the patient, participating in the key portions of the service and reviewing pertinent diagnostic studies and records.  I reviewed the nurse practitioner's note and agree with the findings and the plan.  The assessment and plan were discussed with the patient.  A few questions were asked by the patient and answered.   Nolon Stalls, MD 11/29/2017,8:55 AM

## 2017-11-29 NOTE — Progress Notes (Signed)
Patient states he is feeling much better today after getting fluids on Monday.

## 2017-11-30 LAB — KAPPA/LAMBDA LIGHT CHAINS
Kappa free light chain: 8.1 mg/L (ref 3.3–19.4)
Kappa, lambda light chain ratio: 1.13 (ref 0.26–1.65)
Lambda free light chains: 7.2 mg/L (ref 5.7–26.3)

## 2017-12-03 LAB — PROTEIN ELECTROPHORESIS, SERUM
A/G Ratio: 1.5 (ref 0.7–1.7)
Albumin ELP: 3.5 g/dL (ref 2.9–4.4)
Alpha-1-Globulin: 0.2 g/dL (ref 0.0–0.4)
Alpha-2-Globulin: 0.9 g/dL (ref 0.4–1.0)
Beta Globulin: 1 g/dL (ref 0.7–1.3)
Gamma Globulin: 0.2 g/dL — ABNORMAL LOW (ref 0.4–1.8)
Globulin, Total: 2.3 g/dL (ref 2.2–3.9)
M-Spike, %: 0.1 g/dL — ABNORMAL HIGH
Total Protein ELP: 5.8 g/dL — ABNORMAL LOW (ref 6.0–8.5)

## 2017-12-05 ENCOUNTER — Ambulatory Visit: Payer: Medicare Other | Admitting: Pain Medicine

## 2017-12-06 ENCOUNTER — Encounter: Payer: Self-pay | Admitting: Pain Medicine

## 2017-12-06 ENCOUNTER — Ambulatory Visit: Payer: Medicare Other | Attending: Pain Medicine | Admitting: Pain Medicine

## 2017-12-06 ENCOUNTER — Other Ambulatory Visit: Payer: Self-pay

## 2017-12-06 VITALS — BP 127/73 | HR 92 | Temp 97.0°F | Resp 18 | Ht 66.0 in | Wt 165.0 lb

## 2017-12-06 DIAGNOSIS — Z881 Allergy status to other antibiotic agents status: Secondary | ICD-10-CM | POA: Insufficient documentation

## 2017-12-06 DIAGNOSIS — M545 Low back pain: Secondary | ICD-10-CM | POA: Insufficient documentation

## 2017-12-06 DIAGNOSIS — Z459 Encounter for adjustment and management of unspecified implanted device: Secondary | ICD-10-CM | POA: Diagnosis not present

## 2017-12-06 DIAGNOSIS — Z9689 Presence of other specified functional implants: Secondary | ICD-10-CM | POA: Diagnosis not present

## 2017-12-06 DIAGNOSIS — Z9841 Cataract extraction status, right eye: Secondary | ICD-10-CM | POA: Diagnosis not present

## 2017-12-06 DIAGNOSIS — G8929 Other chronic pain: Secondary | ICD-10-CM

## 2017-12-06 DIAGNOSIS — C9001 Multiple myeloma in remission: Secondary | ICD-10-CM | POA: Diagnosis not present

## 2017-12-06 DIAGNOSIS — G894 Chronic pain syndrome: Secondary | ICD-10-CM | POA: Diagnosis not present

## 2017-12-06 DIAGNOSIS — M4854XA Collapsed vertebra, not elsewhere classified, thoracic region, initial encounter for fracture: Secondary | ICD-10-CM | POA: Insufficient documentation

## 2017-12-06 DIAGNOSIS — S22080A Wedge compression fracture of T11-T12 vertebra, initial encounter for closed fracture: Secondary | ICD-10-CM

## 2017-12-06 DIAGNOSIS — Z978 Presence of other specified devices: Secondary | ICD-10-CM

## 2017-12-06 DIAGNOSIS — Z451 Encounter for adjustment and management of infusion pump: Secondary | ICD-10-CM

## 2017-12-06 NOTE — Progress Notes (Signed)
Patient's Name: Logan Perez  MRN: [id number]  Referring Provider: Sofie Hartigan, MD  DOB: 1945/12/13  PCP: Sofie Hartigan, MD  DOS: 12/06/2017  Note by: Gaspar Cola, MD  Service setting: Ambulatory outpatient  Specialty: Interventional Pain Management  Patient type: Established  Location: [facility] (AMB) Pain Management Facility  Visit type: Interventional Procedure   Primary Reason for Visit: Interventional Pain Management Treatment. CC: Back Pain (low)  Procedure:  Intrathecal Drug Delivery System (IDDS):  Type: Reservoir Refill [phone]) No rate change Region: Abdominal Laterality: Right  Type of Pump: Medtronic Synchromed II (MRI-compatible) Delivery Route: Intrathecal Type of Pain Treated: Neuropathic/Nociceptive Primary Medication Class: Opioid/opiate  Medication, Concentration, Infusion Program, & Delivery Rate: Please see scanned programming printout.   Indications: 1. Chronic pain syndrome   2. Compression fracture of T12 vertebra (HCC) (70-75% magnitude) (with mild retropulsion)   3. Multiple myeloma in remission ([place])   4. Chronic low back pain   5. Presence of intrathecal pump   6. Encounter for interrogation of infusion pump   7. Encounter for adjustment or management of infusion pump    Pain Assessment: Self-Reported Pain Score: 4 /10             Reported level is compatible with observation.        Intrathecal Pump Therapy Assessment  Manufacturer: Medtronic Synchromed II Type: Programmable Volume: 40 mL reservoir MRI compatibility: Yes   Drug content:  Primary Medication Class: Opioid Primary Medication: PF-Fentanyl Secondary Medication: PF-Bupivacaine Other Medication: PF-Clonidine   Programming:  Type: Simple continuous. See pump readout for details.   Changes:  Medication Change: None at this point Rate Change: No change in rate  Reported side-effects or adverse reactions: None reported  Effectiveness: Described as relatively  effective, allowing for increase in activities of daily living (ADL) Clinically meaningful improvement in function (CMIF): Sustained CMIF goals met  Plan: Pump refill today  Pre-op Assessment:  Logan Perez is a 72 y.o. (year old), male patient, seen today for interventional treatment. He  has a past surgical history that includes Knee arthroscopy (Left, 1992); Pain pump implantation (2012); stem cell implant (2008); Cataract extraction w/PHACO (Right, 05/02/2016); Cataract extraction w/PHACO (Left, 05/16/2016); Limbal stem cell transplant; PORTA CATH INSERTION (N/A, 12/04/2016); Colonoscopy with propofol (N/A, 03/22/2017); Esophagogastroduodenoscopy (egd) with propofol (03/22/2017); Eye surgery; Back surgery (1994); and Pain pump implantation (N/A, 09/04/2017). Logan Perez has a current medication list which includes the following prescription(s): acetaminophen, alprazolam, apixaban, baclofen, calcium-vitamin d, cetirizine, vitamin d3, daratumumab, dexamethasone, diltiazem, diltiazem, dimenhydrinate, diphenhydramine, ensure, fluticasone, hypromellose, loperamide, lovastatin, lovastatin, montelukast, multiple vitamins-iron, omeprazole, ondansetron, potassium chloride sa, ranitidine, tamsulosin, valacyclovir, vitamin b-12, and PAIN MANAGEMENT IT PUMP REFILL. His primarily concern today is the Back Pain (low)  Initial Vital Signs:  Pulse Rate: 92 Temp: (!) 97 F (36.1 C) Resp: 18 BP: 127/73 SpO2: 100 %  BMI: Estimated body mass index is 26.63 kg/m as calculated from the following:   Height as of this encounter: '5\' 6"'  (1.676 m).   Weight as of this encounter: 165 lb (74.8 kg).  Risk Assessment: Allergies: Reviewed. He is allergic to azithromycin; bee pollen; pollen extract; zometa [zoledronic acid]; and rivaroxaban.  Allergy Precautions: None required Coagulopathies: Reviewed. None identified.  Blood-thinner therapy: None at this time Active Infection(s): Reviewed. None identified. Logan Perez is  afebrile  Site Confirmation: Logan Perez was asked to confirm the procedure and laterality before marking the site Procedure checklist: Completed Consent: Before the procedure and under the influence of  no sedative(s), amnesic(s), or anxiolytics, the patient was informed of the treatment options, risks and possible complications. To fulfill our ethical and legal obligations, as recommended by the American Medical Association's Code of Ethics, I have informed the patient of my clinical impression; the nature and purpose of the treatment or procedure; the risks, benefits, and possible complications of the intervention; the alternatives, including doing nothing; the risk(s) and benefit(s) of the alternative treatment(s) or procedure(s); and the risk(s) and benefit(s) of doing nothing.  Logan Perez was provided with information about the general risks and possible complications associated with most interventional procedures. These include, but are not limited to: failure to achieve desired goals, infection, bleeding, organ or nerve damage, allergic reactions, paralysis, and/or death.  In addition, he was informed of those risks and possible complications associated to this particular procedure, which include, but are not limited to: damage to the implant; failure to decrease pain; local, systemic, or serious CNS infections, intraspinal abscess with possible cord compression and paralysis, or life-threatening such as meningitis; bleeding; organ damage; nerve injury or damage with subsequent sensory, motor, and/or autonomic system dysfunction, resulting in transient or permanent pain, numbness, and/or weakness of one or several areas of the body; allergic reactions, either minor or major life-threatening, such as anaphylactic or anaphylactoid reactions.  Furthermore, Logan Perez was informed of those risks and complications associated with the medications. These include, but are not limited to: allergic  reactions (i.e.: anaphylactic or anaphylactoid reactions); endorphine suppression; bradycardia and/or hypotension; water retention and/or peripheral vascular relaxation leading to lower extremity edema and possible stasis ulcers; respiratory depression and/or shortness of breath; decreased metabolic rate leading to weight gain; swelling or edema; medication-induced neural toxicity; particulate matter embolism and blood vessel occlusion with resultant organ, and/or nervous system infarction; and/or intrathecal granuloma formation with possible spinal cord compression and permanent paralysis.  Before refilling the pump Mr. Wagley was informed that some of the medications used in the devise may not be FDA approved for such use and therefore it constitutes an off-label use of the medications.  Finally, he was informed that Medicine is not an exact science; therefore, there is also the possibility of unforeseen or unpredictable risks and/or possible complications that may result in a catastrophic outcome. The patient indicated having understood very clearly. We have given the patient no guarantees and we have made no promises. Enough time was given to the patient to ask questions, all of which were answered to the patient's satisfaction. Mr. Gasparro has indicated that he wanted to continue with the procedure. Attestation: I, the ordering provider, attest that I have discussed with the patient the benefits, risks, side-effects, alternatives, likelihood of achieving goals, and potential problems during recovery for the procedure that I have provided informed consent. Date  Time: 12/06/2017  1:14 PM  Pre-Procedure Preparation:  Monitoring: As per clinic protocol. Respiration, ETCO2, SpO2, BP, heart rate and rhythm monitor placed and checked for adequate function Safety Precautions: Patient was assessed for positional comfort and pressure points before starting the procedure. Time-out: I initiated and conducted  the "Time-out" before starting the procedure, as per protocol. The patient was asked to participate by confirming the accuracy of the "Time Out" information. Verification of the correct person, site, and procedure were performed and confirmed by me, the nursing staff, and the patient. "Time-out" conducted as per Joint Commission's Universal Protocol (UP.01.01.01). Time:    Description of Procedure Process:   Position: Supine Target Area: Central-port of intrathecal pump. Approach: Anterior, 90 degree  angle approach. Area Prepped: Entire Area around the pump implant. Prepping solution: ChloraPrep (2% chlorhexidine gluconate and 70% isopropyl alcohol) Safety Precautions: Aspiration looking for blood return was conducted prior to all injections. At no point did we inject any substances, as a needle was being advanced. No attempts were made at seeking any paresthesias. Safe injection practices and needle disposal techniques used. Medications properly checked for expiration dates. SDV (single dose vial) medications used. Description of the Procedure: Protocol guidelines were followed. Two nurses trained to do implant refills were present during the entire procedure. The refill medication was checked by both healthcare providers as well as the patient. The patient was included in the "Time-out" to verify the medication. The patient was placed in position. The pump was identified. The area was prepped in the usual manner. The sterile template was positioned over the pump, making sure the side-port location matched that of the pump. Both, the pump and the template were held for stability. The needle provided in the Medtronic Kit was then introduced thru the center of the template and into the central port. The pump content was aspirated and discarded volume documented. The new medication was slowly infused into the pump, thru the filter, making sure to avoid overpressure of the device. The needle was then removed  and the area cleansed, making sure to leave some of the prepping solution back to take advantage of its long term bactericidal properties. The pump was interrogated and programmed to reflect the correct medication, volume, and dosage. The program was printed and taken to the physician for approval. Once checked and signed by the physician, a copy was provided to the patient and another scanned into the EMR. Vitals:   12/06/17 1311  BP: 127/73  Pulse: 92  Resp: 18  Temp: (!) 97 F (36.1 C)  SpO2: 100%  Weight: 165 lb (74.8 kg)  Height: '5\' 6"'  (1.676 m)    Start Time:   hrs. End Time:   hrs. Materials & Medications: Medtronic Refill Kit Medication(s): Please see chart orders for details.  Imaging Guidance:  Type of Imaging Technique: None used Indication(s): N/A Exposure Time: No patient exposure Contrast: None used. Fluoroscopic Guidance: N/A Ultrasound Guidance: N/A Interpretation: N/A  Antibiotic Prophylaxis:   Anti-infectives (From admission, onward)   None     Indication(s): None identified  Post-operative Assessment:  Post-procedure Vital Signs:  Pulse Rate: 92 Temp: (!) 97 F (36.1 C) Resp: 18 BP: 127/73 SpO2: 100 %  EBL: None  Complications: No immediate post-treatment complications observed by team, or reported by patient.  Note: The patient tolerated the entire procedure well. A repeat set of vitals were taken after the procedure and the patient was kept under observation following institutional policy, for this type of procedure. Post-procedural neurological assessment was performed, showing return to baseline, prior to discharge. The patient was provided with post-procedure discharge instructions, including a section on how to identify potential problems. Should any problems arise concerning this procedure, the patient was given instructions to immediately contact us, at any time, without hesitation. In any case, we plan to contact the patient by telephone for  a follow-up status report regarding this interventional procedure.  Comments:  No additional relevant information.  Plan of Care   Imaging Orders  No imaging studies ordered today    Procedure Orders     PUMP REFILL     PUMP REFILL  Medications ordered for procedure: No orders of the defined types were placed in this encounter.  Medications administered: Lexine Baton "Rick" had no medications administered during this visit.  See the medical record for exact dosing, route, and time of administration.  New Prescriptions   No medications on file   Disposition: Discharge home  Discharge Date & Time: 12/06/2017; 1400 hrs.   Physician-requested Follow-up: Return for Pump Refill (as per pump program) (Max:98mo.  Future Appointments  Date Time Provider DTuron 12/27/2017  8:00 AM [facility] FLUSH [facility] None  12/27/2017  8:30 AM CLequita Asal MD [facility] None  12/27/2017  9:00 AM [facility] [place] INFUSION CHAIR 6 [facility] None  02/27/2018 11:45 AM NMilinda Pointer MD [facility] None  05/02/2018  8:30 AM McGowan, SHunt Oris PA-C [facility] None   Primary Care Physician: FSofie Hartigan MD [facility] Note by: FGaspar Cola MD Date: 12/06/2017; Time: 2:25 PM  Disclaimer:  Medicine is not an eChief Strategy Officer The only guarantee in medicine is that nothing is guaranteed. It is important to note that the decision to proceed with this intervention was based on the information collected from the patient. The Data and conclusions were drawn from the patient's questionnaire, the interview, and the physical examination. Because the information was provided in large part by the patient, it cannot be guaranteed that it has not been purposely or unconsciously manipulated. Every effort has been made to obtain as much relevant data as possible for this evaluation. It is important to note that the conclusions that lead to this  procedure are derived in large part from the available data. Always take into account that the treatment will also be dependent on availability of resources and existing treatment guidelines, considered by other Pain Management Practitioners as being common knowledge and practice, at the time of the intervention. For Medico-Legal purposes, it is also important to point out that variation in procedural techniques and pharmacological choices are the acceptable norm. The indications, contraindications, technique, and results of the above procedure should only be interpreted and judged by a Board-Certified Interventional Pain Specialist with extensive familiarity and expertise in the same exact procedure and technique.

## 2017-12-06 NOTE — Progress Notes (Addendum)
Safety precautions to be maintained throughout the outpatient stay will include: orient to surroundings, keep bed in low position, maintain call bell within reach at all times, provide assistance with transfer out of bed and ambulation. Intathecal pump refilled under sterile technique witnesssed by D. Donneta Romberg RN without difficulty.  8 ml wasted in sink witnessed by D. Donneta Romberg [employer]

## 2017-12-06 NOTE — Patient Instructions (Signed)
Opioid Overdose Opioids are substances that relieve pain by binding to pain receptors in your brain and spinal cord. Opioids include illegal drugs, such as heroin, as well as prescription pain medicines.An opioid overdose happens when you take too much of an opioid substance. This can happen with any type of opioid, including:  Heroin.  Morphine.  Codeine.  Methadone.  Oxycodone.  Hydrocodone.  Fentanyl.  Hydromorphone.  Buprenorphine.  The effects of an overdose can be mild, dangerous, or even deadly. Opioid overdose is a medical emergency. What are the causes? This condition may be caused by:  Taking too much of an opioid by accident.  Taking too much of an opioid on purpose.  An error made by a health care provider who prescribes a medicine.  An error made by the pharmacist who fills the prescription order.  Using more than one substance that contains opioids at the same time.  Mixing an opioid with a substance that affects your heart, breathing, or blood pressure. These include alcohol, tranquilizers, sleeping pills, illegal drugs, and some over-the-counter medicines.  What increases the risk? This condition is more likely in:  Children. They may be attracted to colorful pills. Because of a child's small size, even a small amount of a drug can be dangerous.  Elderly people. They may be taking many different drugs. Elderly people may have difficulty reading labels or remembering when they last took their medicine.  People who take an opioid on a long-term basis.  People who use: ? Illegal drugs. ? Other substances, including alcohol, while using an opioid.  People who have: ? A history of drug or alcohol abuse. ? Certain mental health conditions.  People who take opioids that are not prescribed for them.  What are the signs or symptoms? Symptoms of this condition depend on the type of opioid and the amount that was taken. Common symptoms  include:  Sleepiness or difficulty waking from sleep.  Confusion.  Slurred speech.  Slowed breathing and a slow pulse.  Nausea and vomiting.  Abnormally small pupils.  Signs and symptoms that require emergency treatment include:  Cold, clammy, and pale skin.  Blue lips and fingernails.  Vomiting.  Gurgling sounds in the throat.  A pulse that is very slow or difficult to detect.  Breathing that is very slow, noisy, or difficult to detect.  Limp body.  Inability to respond to speech or be awakened from sleep (stupor).  How is this diagnosed? This condition is diagnosed based on your symptoms. It is important to tell your health care provider:  All of the opioidsthat you took.  When you took the opioids.  Whether you were drinking alcohol or using other substances.  Your health care provider will do a physical exam. This exam may include:  Checking and monitoring your heart rate and rhythm, your breathing rate and depth, your temperature, and your blood pressure (vital signs).  Checking for abnormally small pupils.  Measuring oxygen levels in your blood.  You may also have blood tests or urine tests. How is this treated? Supporting your vital signs and your breathing is the first step in treating an opioid overdose. Treatment may also include:  Giving fluids and minerals (electrolytes) through an IV tube.  Inserting a breathing tube (endotracheal tube) in your airway to help you breathe.  Giving oxygen.  Passing a tube through your nose and into your stomach (NG tube, or nasogastric tube) to wash out your stomach.  Giving medicines that: ? Increase your   blood pressure. ? Absorb any opioid that is in your digestive system. ? Reverse the effects of the opioid (naloxone).  Ongoing counseling and mental health support if you intentionally overdosed or used an illegal drug.  Follow these instructions at home:  Take over-the-counter and prescription  medicines only as told by your health care provider. Always ask your health care provider about possible side effects and interactions of any new medicine that you start taking.  Keep a list of all of the medicines that you take, including over-the-counter medicines. Bring this list with you to all of your medical visits.  Drink enough fluid to keep your urine clear or pale yellow.  Keep all follow-up visits as told by your health care provider. This is important. How is this prevented?  Get help if you are struggling with: ? Alcohol or drug use. ? Depression or another mental health problem.  Keep the phone number of your local poison control center near your phone or on your cell phone.  Store all medicines in safety containers that are out of the reach of children.  Read the drug inserts that come with your medicines.  Do not drink alcohol when taking opioids.  Do not use illegal drugs.  Do not take opioid medicines that are not prescribed for you. Contact a health care provider if:  Your symptoms return.  You develop new symptoms or side effects when you are taking medicines. Get help right away if:  You think that you or someone else may have taken too much of an opioid. The hotline of the National Poison Control Center is (800) 222-1222.  You or someone else is having symptoms of an opioid overdose.  You have serious thoughts about hurting yourself or others.  You have: ? Chest pain. ? Difficulty breathing. ? A loss of consciousness. Opioid overdose is an emergency. Do not wait to see if the symptoms will go away. Get medical help right away. Call your local emergency services (911 in the [place] Do not drive yourself to the hospital. This information is not intended to replace advice given to you by your health care provider. Make sure you discuss any questions you have with your health care provider. Document Released: 11/02/2004 Document Revised: 03/02/2016 Document  Reviewed: 03/11/2015 Elsevier Interactive Patient Education  2018 [employer]  

## 2017-12-07 ENCOUNTER — Telehealth: Payer: Self-pay

## 2017-12-07 NOTE — Telephone Encounter (Signed)
Procedure phone call.  Patient states he is doing good.

## 2017-12-09 MED FILL — Medication: INTRATHECAL | Qty: 1 | Status: AC

## 2017-12-11 ENCOUNTER — Encounter: Payer: Medicare Other | Admitting: Pain Medicine

## 2017-12-12 DIAGNOSIS — R42 Dizziness and giddiness: Secondary | ICD-10-CM | POA: Insufficient documentation

## 2017-12-18 ENCOUNTER — Other Ambulatory Visit: Payer: Self-pay | Admitting: *Deleted

## 2017-12-18 DIAGNOSIS — C9001 Multiple myeloma in remission: Secondary | ICD-10-CM

## 2017-12-18 MED ORDER — ALPRAZOLAM 0.25 MG PO TABS: ORAL_TABLET | ORAL | 0 refills | Status: DC

## 2017-12-21 ENCOUNTER — Other Ambulatory Visit: Payer: Self-pay | Admitting: *Deleted

## 2017-12-21 MED ORDER — MONTELUKAST SODIUM 10 MG PO TABS: ORAL_TABLET | ORAL | 0 refills | Status: DC

## 2017-12-27 ENCOUNTER — Inpatient Hospital Stay: Payer: Medicare Other

## 2017-12-27 ENCOUNTER — Encounter: Payer: Self-pay | Admitting: Hematology and Oncology

## 2017-12-27 ENCOUNTER — Inpatient Hospital Stay: Payer: Medicare Other | Attending: Hematology and Oncology

## 2017-12-27 ENCOUNTER — Inpatient Hospital Stay (HOSPITAL_BASED_OUTPATIENT_CLINIC_OR_DEPARTMENT_OTHER): Payer: Medicare Other | Admitting: Hematology and Oncology

## 2017-12-27 VITALS — BP 116/77 | HR 81 | Temp 97.9°F | Resp 16 | Wt 170.3 lb

## 2017-12-27 DIAGNOSIS — C9002 Multiple myeloma in relapse: Secondary | ICD-10-CM | POA: Diagnosis present

## 2017-12-27 DIAGNOSIS — Z8673 Personal history of transient ischemic attack (TIA), and cerebral infarction without residual deficits: Secondary | ICD-10-CM

## 2017-12-27 DIAGNOSIS — G8929 Other chronic pain: Secondary | ICD-10-CM

## 2017-12-27 DIAGNOSIS — D539 Nutritional anemia, unspecified: Secondary | ICD-10-CM | POA: Diagnosis not present

## 2017-12-27 DIAGNOSIS — Z9484 Stem cells transplant status: Secondary | ICD-10-CM | POA: Insufficient documentation

## 2017-12-27 DIAGNOSIS — Z5112 Encounter for antineoplastic immunotherapy: Secondary | ICD-10-CM | POA: Insufficient documentation

## 2017-12-27 DIAGNOSIS — Z86711 Personal history of pulmonary embolism: Secondary | ICD-10-CM | POA: Insufficient documentation

## 2017-12-27 DIAGNOSIS — C9001 Multiple myeloma in remission: Secondary | ICD-10-CM

## 2017-12-27 DIAGNOSIS — Z7901 Long term (current) use of anticoagulants: Secondary | ICD-10-CM | POA: Diagnosis not present

## 2017-12-27 DIAGNOSIS — Z7189 Other specified counseling: Secondary | ICD-10-CM

## 2017-12-27 DIAGNOSIS — D638 Anemia in other chronic diseases classified elsewhere: Secondary | ICD-10-CM | POA: Insufficient documentation

## 2017-12-27 LAB — COMPREHENSIVE METABOLIC PANEL
ALT: 14 U/L — ABNORMAL LOW (ref 17–63)
AST: 28 U/L (ref 15–41)
Albumin: 3.7 g/dL (ref 3.5–5.0)
Alkaline Phosphatase: 37 U/L — ABNORMAL LOW (ref 38–126)
Anion gap: 11 (ref 5–15)
BUN: 20 mg/dL (ref 6–20)
CO2: 20 mmol/L — ABNORMAL LOW (ref 22–32)
Calcium: 8.9 mg/dL (ref 8.9–10.3)
Chloride: 109 mmol/L (ref 101–111)
Creatinine, Ser: 1.42 mg/dL — ABNORMAL HIGH (ref 0.61–1.24)
GFR calc Af Amer: 56 mL/min — ABNORMAL LOW (ref >60)
GFR calc non Af Amer: 48 mL/min — ABNORMAL LOW (ref >60)
Glucose, Bld: 129 mg/dL — ABNORMAL HIGH (ref 65–99)
Potassium: 4 mmol/L (ref 3.5–5.1)
Sodium: 140 mmol/L (ref 135–145)
Total Bilirubin: 0.6 mg/dL (ref 0.3–1.2)
Total Protein: 6.1 g/dL — ABNORMAL LOW (ref 6.5–8.1)

## 2017-12-27 LAB — CBC WITH DIFFERENTIAL/PLATELET
Basophils Absolute: 0.1 10*3/uL (ref 0–0.1)
Basophils Relative: 1 %
Eosinophils Absolute: 0.2 10*3/uL (ref 0–0.7)
Eosinophils Relative: 2 %
HCT: 34.3 % — ABNORMAL LOW (ref 40.0–52.0)
Hemoglobin: 12 g/dL — ABNORMAL LOW (ref 13.0–18.0)
Lymphocytes Relative: 22 %
Lymphs Abs: 1.8 10*3/uL (ref 1.0–3.6)
MCH: 36.3 pg — ABNORMAL HIGH (ref 26.0–34.0)
MCHC: 34.8 g/dL (ref 32.0–36.0)
MCV: 104.1 fL — ABNORMAL HIGH (ref 80.0–100.0)
Monocytes Absolute: 0.7 10*3/uL (ref 0.2–1.0)
Monocytes Relative: 8 %
Neutro Abs: 5.5 10*3/uL (ref 1.4–6.5)
Neutrophils Relative %: 67 %
Platelets: 188 10*3/uL (ref 150–440)
RBC: 3.3 MIL/uL — ABNORMAL LOW (ref 4.40–5.90)
RDW: 15.4 % — ABNORMAL HIGH (ref 11.5–14.5)
WBC: 8.2 10*3/uL (ref 3.8–10.6)

## 2017-12-27 LAB — MAGNESIUM: Magnesium: 1.7 mg/dL (ref 1.7–2.4)

## 2017-12-27 MED ORDER — SODIUM CHLORIDE 0.9 % IV SOLN
1200.0000 mg | Freq: Once | INTRAVENOUS | Status: AC
  Administered 2017-12-27: 1200 mg via INTRAVENOUS
  Filled 2017-12-27: qty 60

## 2017-12-27 MED ORDER — SODIUM CHLORIDE 0.9 % IV SOLN
Freq: Once | INTRAVENOUS | Status: AC
  Administered 2017-12-27: 10:00:00 via INTRAVENOUS
  Filled 2017-12-27: qty 1000

## 2017-12-27 MED ORDER — HEPARIN SOD (PORK) LOCK FLUSH 100 UNIT/ML IV SOLN
500.0000 [IU] | Freq: Once | INTRAVENOUS | Status: AC | PRN
  Administered 2017-12-27: 500 [IU]
  Filled 2017-12-27: qty 5

## 2017-12-27 NOTE — Progress Notes (Signed)
Per Casey Burkitt., RN, pt takes premeds at home.

## 2017-12-27 NOTE — Progress Notes (Signed)
[facility] day:  12/27/17  Chief Complaint: Logan Perez is a 72 y.o. 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   HPI: The patient was last seen in the medical oncology clinic on 11/29/2017.  At that time, he felt "better" following an acute illness the week prior.  Exam was stable.  WBC was 5300 with an [place] of 3100. Hemoglobin was 12.5, hematocrit 35.8, platelets 146,000.  Creatinine was 1.51 (CrCl 42 mL/min).  He received cycle #12 daratumumab.  During the interim, patient is doing well overall. He has no acute concerns today. Patient asking about second part of his Shingles series. [facility] has been unable to obtain. 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 Patient denies nausea, vomiting, and weight loss. He has no B symptoms. No recent infections. He continues to have loose stools. He uses loperamide daily.   Patient complains of chronic pain in his back rated 2/10.    Past Medical History:  Diagnosis Date  . Anxiety   . Atrial fibrillation ([place])   . BPH (benign prostatic hyperplasia)   . Complication of anesthesia    BAD HEADACHE NIGHT OF FIRST CATARACT  . Compression fracture of lumbar vertebra ([place])   . Difficulty voiding   . Dysrhythmia    A FIB  . Elevated PSA   . GERD (gastroesophageal reflux disease)   . Hearing aid worn    bilateral  . History of kidney stones   . HLD (hyperlipidemia)   . HOH (hard of hearing)   . Hypertension   . Multiple myeloma ([place])   . Neuropathy    feet. 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  . Pain    BACK  . Palpitations   . Pneumonia   . Pulmonary embolism ([place])   . Sepsis ([place])   . Stroke [facility])    TIA, detected on CT scan. pt was unaware    Past Surgical History:  Procedure Laterality Date   . BACK SURGERY  1994  . CATARACT EXTRACTION W/PHACO Right 05/02/2016   Procedure: CATARACT EXTRACTION PHACO AND INTRAOCULAR LENS PLACEMENT (IOC);  Surgeon: Birder Robson, MD;  Location: [facility] ORS;  Service: Ophthalmology;  Laterality: Right;  [place] 1.06 AP% 20.6 CDE 13.70 FLUID PACK LOT # [id number]  . CATARACT EXTRACTION W/PHACO Left 05/16/2016   Procedure: CATARACT EXTRACTION PHACO AND INTRAOCULAR LENS PLACEMENT (IOC);  Surgeon: Birder Robson, MD;  Location: [facility] OR[facility];  Service: Ophthalmology;  Laterality: Left;  [place] 01:43 AP% 19.8 CDE 20.45 FLUID PACK LOT #[id number] H  . COLONOSCOPY WITH PROPOFOL N/A 03/22/2017   Procedure: COLONOSCOPY WITH PROPOFOL;  Surgeon: Lucilla Lame, MD;  Location: [place];  Service: Endoscopy;  Laterality: N/A;  has port  . ESOPHAGOGASTRODUODENOSCOPY (EGD) WITH PROPOFOL  03/22/2017   Procedure: ESOPHAGOGASTRODUODENOSCOPY (EGD) WITH PROPOFOL;  Surgeon: Lucilla Lame, MD;  Location: [place];  Service: Endoscopy;;  . EYE SURGERY    . KNEE ARTHROSCOPY Left 1992  . LIMBAL STEM CELL TRANSPLANT    . PAIN PUMP IMPLANTATION  2012  . PAIN PUMP IMPLANTATION N/A 09/04/2017   Procedure: INTRATHECAL PUMP BATTERY CHANGE;  Surgeon: Milinda Pointer, MD;  Location: [facility];  Service: Neurosurgery;  Laterality: N/A;  . PORTA CATH INSERTION N/A 12/04/2016   Procedure: Glori Luis Cath Insertion;  Surgeon: Algernon Huxley, MD;  Location: [place] [facility];  Service: Cardiovascular;  Laterality: N/A;  . stem cell implant  2008   [facility]    Family History  Problem Relation Age of Onset  . Cancer Father        throat  . Kidney disease Sister   . Stroke Unknown   . Stroke Mother   . Bladder Cancer Neg Hx   . Prostate cancer Neg Hx   . Kidney cancer Neg Hx     Social History:  reports that he quit smoking about 42 years ago. His smoking use included cigarettes. He has a 30.00 pack-year smoking history. He has never used smokeless tobacco. He reports that he does not  drink alcohol or use drugs.  He has a wire haired dachshund.  He lives in [place].  His wife's name is [name].  The patient is alone today.  Allergies:  Allergies  Allergen Reactions  . Azithromycin Diarrhea and Other (See Comments)    Possible cause of C. Diff  . Bee Pollen Other (See Comments)    Sneezing, watery eyes, runny nose  . Pollen Extract Other (See Comments)    Sneezing, watery eyes, runny nose  . Zometa [Zoledronic Acid] Other (See Comments)    ONG- Osteonecrosis of the jaw   . Rivaroxaban Rash    Current Medications: Current Outpatient Medications  Medication Sig Dispense Refill  . acetaminophen (TYLENOL) 325 MG tablet Take 650 mg every 6 (six) hours as needed by mouth for mild pain or headache.     . ALPRAZolam (XANAX) 0.25 MG tablet TAKE 1 TABLET BY MOUTH EVERY NIGHT AT BEDTIME PATCH 30 tablet 0  . apixaban (ELIQUIS) 5 MG TABS tablet Take 1 tablet (5 mg total) by mouth 2 (two) times daily. 60 tablet 5  . baclofen (LIORESAL) 10 MG tablet Take 1 tablet (10 mg total) by mouth at bedtime. 30 tablet 5  . Calcium Carb-Cholecalciferol (CALCIUM-VITAMIN D) 500-400 MG-UNIT TABS Take 1 tablet daily by mouth.    . cetirizine (ZYRTEC) 10 MG tablet Take 10 mg by mouth daily as needed for allergies.     . Cholecalciferol (VITAMIN D3) 2000 units capsule Take 2,000 Units daily by mouth.     . Daratumumab (DARZALEX IV) Inject 1,200 mg every 30 (thirty) days into the vein.     [name] dexamethasone (DECADRON) 4 MG tablet Take 4 tabs PO 1 hr prior to infusion, then take 1 tab on day 2 and day 3 30 tablet 1  . diltiazem (CARDIZEM CD) 120 MG 24 hr capsule Take 120 mg daily by mouth.     . diltiazem (CARDIZEM) 60 MG tablet Take 60 mg daily as needed by mouth (for increased heart rate > 140).     [name] diphenhydrAMINE (BENADRYL) 25 MG tablet Take 25 mg by mouth as directed. With chemo treatment    . ENSURE (ENSURE) Take 237 mLs by mouth daily.    . fluticasone (FLONASE) 50 MCG/ACT nasal spray Place 1  spray into the nose daily as needed for allergies.     . Hypromellose (ARTIFICIAL TEARS OP) Place 1 drop as needed into both eyes (for dry eyes).    [name] loperamide (IMODIUM) 2 MG capsule Take 4 mg as needed by mouth for diarrhea or loose stools.     . lovastatin (MEVACOR) 20 MG tablet Take 20 mg by mouth every evening.     . montelukast (SINGULAIR) 10 MG tablet Take 17ms daily the day before the infusion, the day of,  and the 2 days after infusion 30 tablet 0  . Multiple Vitamins-Iron (MULTIVITAMIN/IRON PO) Take 1 tablet daily by mouth.    [name] omeprazole (PRILOSEC) 20 MG capsule Take 20 mg by mouth daily.     . ondansetron (ZOFRAN) 8 MG tablet Take 8 mg by mouth as directed. Take with chemo and can take it as needed    . PAIN MANAGEMENT IT PUMP REFILL 1 each by Intrathecal route once for 1 dose. Medication: PF Fentanyl 1,500.0 mcg/ml PF Bupivicaine 30.0 mg/ml PF Clonidine 300.0 mcg/ml Total Volume: 40 ml Needed by 12-05-17  1000 1 each 0  . potassium chloride SA (K-DUR,KLOR-CON) 20 MEQ tablet TAKE 1 TABLET BY MOUTH DAILY 90 tablet 0  . ranitidine (ZANTAC) 150 MG tablet Take 150 mg by mouth at bedtime.     . tamsulosin (FLOMAX) 0.4 MG CAPS capsule Take 1 capsule (0.4 mg total) by mouth daily. 90 capsule 4  . valACYclovir (VALTREX) 500 MG tablet TAKE 1 TABLET BY MOUTH DAILY 30 tablet 3  . vitamin B-12 (CYANOCOBALAMIN) 1000 MCG tablet Take 1,000 mcg daily by mouth.     . dimenhyDRINATE (DRAMAMINE) 50 MG tablet Take 50 mg by mouth every 8 (eight) hours as needed.     No current facility-administered medications for this visit.     Review of Systems:  GENERAL: Feels "good".  No fevers or sweats.  Weight up 5 pounds. PERFORMANCE STATUS (ECOG): 1 HEENT:  Cataracts s/p surgery.  No sore throat, mouth sores or tenderness. Lungs:  No shortness of breath.  No cough.  No hemoptysis. Cardiac:  No chest pain, palpitations, orthopnea, or PND. GI:  Appetite good. Loose stools, well managed.  No vomiting,  constipation, melena or hematochezia. GU:  No urgency, frequency, dysuria, or hematuria. Musculoskeletal:  Compression fracture of back on a pain pump. No muscle tenderness.  Notes height loss of 4 inches (old). Extremities:  No pain or swelling. Skin:  s/p Moh's surgery.  Fragile skin.  No rashes or skin changes. Neuro: Neuropathy in feet.  Sensitivity in hands.  No headache, weakness, balance or coordination issues. Endocrine:  No diabetes, thyroid issues, hot flashes or night sweats. Psych:  No mood changes, depression or anxiety. Pain:  Chronic back pain (pain well managed with pump). 2/10 pain today.  Review of systems:  All other systems reviewed and found to be negative.   Physical Exam: Blood pressure 116/77, pulse 81, temperature 97.9 F (36.6 C), temperature source Tympanic, resp. rate 16, weight 170 lb 4.8 oz (77.2 kg). GENERAL:  Well developed, well nourished gentleman sitting comfortably in the exam room in no acute distress.  MENTAL STATUS:  Alert and oriented to person, place and time. HEAD:  Short brown hair.  Graying goatee.  Normocephalic, atraumatic, face symmetric, no Cushingoid features. EYES:  Glasses.  Blue eyes s/p cataract surgery.  Pupils equal round and reactive to light and accomodation.  No conjunctivitis or scleral icterus. ENT:  Hearing aide.  Oropharynx clear without lesion.  Tongue normal. Mucous membranes moist.  RESPIRATORY:  Clear to auscultation without rales, wheezes or rhonchi. CARDIOVASCULAR:  Regular rate and rhythm without murmur, rub or gallop. ABDOMEN:  RUQ pain pump.  Soft, non-tender, with active bowel sounds, and no hepatosplenomegaly.  No masses. SKIN:  Small area of transplanted skin on tip of nose s/p Moh's surgery.  No rashes, ulcers or lesions. EXTREMITIES: No edema, skin discoloration or tenderness.  No palpable cords. LYMPH NODES: No palpable cervical, supraclavicular, axillary or inguinal  adenopathy  NEUROLOGICAL: Slight intention  tremor. PSYCH:  Appropriate.    Appointment on 12/27/2017  Component Date Value Ref Range Status  . Magnesium 12/27/2017 1.7  1.7 - 2.4 mg/dL Final   Performed at [facility], [address], [place], [place] [address]  . Sodium 12/27/2017 140  135 - 145 mmol/L Final  . Potassium 12/27/2017 4.0  3.5 - 5.1 mmol/L Final  . Chloride 12/27/2017 109  101 - 111 mmol/L Final  . CO2 12/27/2017 20* 22 - 32 mmol/L Final  . Glucose, Bld 12/27/2017 129* 65 - 99 mg/dL Final  . BUN 12/27/2017 20  6 - 20 mg/dL Final  . Creatinine, Ser 12/27/2017 1.42* 0.61 - 1.24 mg/dL Final  . Calcium 12/27/2017 8.9  8.9 - 10.3 mg/dL Final  . Total Protein 12/27/2017 6.1* 6.5 - 8.1 g/dL Final  . Albumin 12/27/2017 3.7  3.5 - 5.0 g/dL Final  . AST 12/27/2017 28  15 - 41 U/L Final  . ALT 12/27/2017 14* 17 - 63 U/L Final  . Alkaline Phosphatase 12/27/2017 37* 38 - 126 U/L Final  . Total Bilirubin 12/27/2017 0.6  0.3 - 1.2 mg/dL Final  . GFR calc non Af Amer 12/27/2017 48* >60 mL/min Final  . GFR calc Af Amer 12/27/2017 56* >60 mL/min Final   Comment: (NOTE) The eGFR has been calculated using the CKD EPI equation. This calculation has not been validated in all clinical situations. eGFR's persistently <60 mL/min signify possible Chronic Kidney Disease.   Georgiann Hahn gap 12/27/2017 11  5 - 15 Final   Performed at [facility], [address] [place], [place] [address]  . WBC 12/27/2017 8.2  3.8 - 10.6 K/uL Final  . RBC 12/27/2017 3.30* 4.40 - 5.90 MIL/uL Final  . Hemoglobin 12/27/2017 12.0* 13.0 - 18.0 g/dL Final  . HCT 12/27/2017 34.3* 40.0 - 52.0 % Final  . MCV 12/27/2017 104.1* 80.0 - 100.0 fL Final  . MCH 12/27/2017 36.3* 26.0 - 34.0 pg Final  . MCHC 12/27/2017 34.8  32.0 - 36.0 g/dL Final  . RDW 12/27/2017 15.4* 11.5 - 14.5 % Final  . Platelets 12/27/2017 188  150 - 440 K/uL Final  . Neutrophils Relative % 12/27/2017 67  % Final  . Neutro Abs 12/27/2017 5.5  1.4 - 6.5 K/uL Final  . Lymphocytes  Relative 12/27/2017 22  % Final  . Lymphs Abs 12/27/2017 1.8  1.0 - 3.6 K/uL Final  . Monocytes Relative 12/27/2017 8  % Final  . Monocytes Absolute 12/27/2017 0.7  0.2 - 1.0 K/uL Final  . Eosinophils Relative 12/27/2017 2  % Final  . Eosinophils Absolute 12/27/2017 0.2  0 - 0.7 K/uL Final  . Basophils Relative 12/27/2017 1  % Final  . Basophils Absolute 12/27/2017 0.1  0 - 0.1 K/uL Final   Performed at [facility], [address], [place], [place] [address]    Assessment:  Caison Hearn is a 72 y.o. male with stage III mutiple myeloma.  He initially presented with progressive back pain beginning in 12/2006.  MRI revealed "spots and compression fractures".  He began Velcade, thalidomide, and Decadron.  In 08/2007, he underwent high dose chemotherapy and autologous stem cell transplant.  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  He recurred with a rising M-spike (2.7) with repeat M spike (1.7 gm/dl) in 03/2010.  He was initially treated with Velcade (02/08/2010 - 05/10/2010).  He then began Revlimid (15 mg 3 weeks on/1 week  off) and Decadron (40 mg on day 1, 8, 15, 22).  Because of significant side effect with Decadron his dose was decreased to 10 mg once a week in 07/2010.    He was on maintenance Revlimid. Revlimid was initially10 mg 3 weeks on/1 week off. This was changed to 10 mg 2 weeks on/2 weeks off secondary to right nipple tenderness. His dose was increased to 10 mg 3 weeks on/1 week off with Decadron 10 mg a week (on Sundays) and then Revlamid 15 mg 3 weeks on and 1 week off with Decadron on Sundays.  He began Pomalyst 4 mg 3 weeks on/1 week off with Decadron on 08/27/2015.  Over the past year his SPEP has revealed no monoclonal protein (04/21/2015) and 0.5 gm/dL on 09/22/2015 and 10/20/2015. M spike was 0.1 on 02/02/2016, 03/01/2016, 03/29/2016, 04/26/2016, and 05/24/2016.  M spike was 0 on 10/11/2016, 11/28/2016, 02/05/2017, 03/21/2017,  08/02/2017, 10/04/2017, and 12/27/2017.  M spike was 0.1 on 11/01/2017 and 0.1 on 11/29/2017.   Free light chains have been monitored. Kappa free light chains were 18.54 on 11/21/2013, 18.37 on 02/20/2014, 18.93 on 05/22/2014, 32.58 (high; normal ratio 1.27) on 08/21/2014, 51.53 (high; elevated ratio of 2.12) on 11/13/2014, 28.08 (ratio 1.73) on 12/09/2014, 23.71 (ratio 2.17) on 01/18/2015, 92.93 (ratio 9.49) on 04/21/2015, 93.44 (ratio 10.28) on 05/26/2015, 255.45 (ratio of 24.05) on 07/14/2015, 373.89 (ratio 48.31) on 08/04/2015, 474.33 (ratio 70.58) on 08/25/2015, 450.76 (ratio 55.44) on 09/22/2015, 453.4 (ratio 59.89) on 10/20/2015, 58.26 (ratio of > 40.74) on 02/02/2016, 62.67 (ratio of 37.98) on 03/01/2016, 75.7 (ratio > 50.47) on 03/29/2016, 79.7 (ratio 53.13) on 04/26/2016, 108.6 (ratio > 72.4) on 05/24/2016, 8.3 (1.46 ratio) on 10/11/2016, 6.7 (0.81 ratio) on 02/05/2017, 7.8 (1.01 ratio) on 03/21/2017,7.6 (ratio 0.63) on 06/01/2017, and 8.1 (ratio 1.13) on 11/29/2017.  Bone survey on 12/08/2014 was stable.  Bone survey on 10/21/2015 revealed increase conspicuity of subcentimeter lytic lesions in the calvarium.  Bone marrow aspirate and biopsy on 11/04/2015 revealed an atypical monoclonal plasma cells estimated at 30-40% of marrow cells.   Marrow was variably cellular (approximately 45%) with background trilineage hematopoiesis. There was no significant increase in marrow reticulin fibers. Storage iron was present.    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 He develoed herpes zoster in 04/2008. He developed a pulmonary embolism in 05/2013. He was initially on Xarelto, but is now on Eliquis. He had an episode of pneumonia around this time requiring a brief admission. He developed severe lower leg cramps on 08/07/2014 secondary to hypokalemia. Duplex was negative.   He was treated for C difficile colitis (Flagyl completed 07/30/2015).   He has a chronic indwelling pain pump.  He received 4 cycles of Pomalyst and Decadron (08/27/2015 - 11/19/2015).  Restaging studies document progressive disease.  Kappa free light chains are increasing.  SPEP revealed 0.5 gm/dL monoclonal protein then 1.3 gm/dL.  Bone survey reveals increase conspicuity of subcentimeter lytic lesions in the calvarium.  Bone marrow reveals 30-40% plasma cells.   MUGA on 11/30/2015 revealed an ejection fraction of 46%.  He is felt not to be a good candidate for Kyprolis.  There were no focal wall motion abnormalities.  He had a stress echo less than 1 year ago.  He has a history of PVCs and atrial fibrillation.  He takes Cardizem prn.  He received 17 weeks of daratumumab (Darzalex) (12/09/2015 - 05/25/2016).  He tolerated treatment well without side effect.  Bone marrow  on 02/09/2016 revealed no diagnostic morphologic evidence of plasma cell myeloma.  Marrow was normocellular to hypocellular marrow for age (ranging from 10-40%) with maturing trilineage hematopoiesis and mild multilineage dyspoiesis.  There was patchy mild increase in reticulin.  Storage iron was present.  Flow cytometry revealed no definitive evidence of monoclonality.  There was a non-specific atypical myeloid and monocytic findings with no increase in blasts.  Cytogenetics were normal (46, XY).  He is currently 18 months s/p 2nd autologous stem cell transplant on 06/16/2016.  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  He also experienced atrial fibrillation with intermittent episodes of RVR requiring IV beta blockers.  He is on prophylactic valacyclovir for 1 year post transplant.  He started vaccinations (DTaP-Pediatric triple vaccine, Hep B- Pediatrix triple vaccine, Haemophilus influenza B (Hib), inactivated polio virus (IPV), pneumococcal conjugate vaccine 13-valent (PCV 13)) on 04/17/2017.  Recommendation was for Shingrix vaccine to be given in  [place] and second dose of Shingrix in 2 months.  He had follow-up vaccinations on 08/28/2017.    He is s/p 12 cycles of Daratumumab (Darzalex) post transplant (12/05/2016 - 11/29/2017).  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  Work-up on 08/23/2016 revealed the following normal studies:  B12 and folate.  Ferritin was 1379 (high).  Iron studies included a saturation of 56% and TIBC 141 (low).  ESR was 64.  Thrombocytopenia has resolved.  He has a history of hypomagnesemia and receives IV magnesium (2-4 gm) weekly.  He has not required magnesium since 10/24/2016.  Symptomatically, he feels "good". Patient continues to have chronic pain. He has loose stools managed with PRN loperamide.  Exam is stable.  WBC is 8200 with an [place] of 5500. Hemoglobin is 12.0, hematocrit 34.3, platelets 188,000.  Creatinine is 1.42 (CrCl 46.7 mL/min).  M-spike is 0.  Plan:  1.  Labs today:  CBC with diff, CMP, SPEP, Mg. 2.  Patient to take own premeds from home:  Ondansetron (8 mg), Tylenol (650 mg), and Benadryl (50 mg). 3.  Blood counts stable and adequate for treatment. Cycle #13 daratumumab today. 4.  Continue Eliquis 5 mg BID as previously scheduled.  5.  RTC in 4 weeks for MD assessment, labs (CBC with diff, CMP, Mg, SPEP), and cycle #14 daratumumab.   Honor Loh, NP 12/27/2017 9:16 AM  I saw and evaluated the patient, participating in the key portions of the service and reviewing pertinent diagnostic studies and records.  I reviewed the nurse practitioner's note and agree with the findings and the plan.  The assessment and plan were discussed with the patient.  A few questions were asked by the patient and answered.   Nolon Stalls, MD 12/27/2017,9:16 AM

## 2017-12-28 ENCOUNTER — Other Ambulatory Visit: Payer: Self-pay | Admitting: Urology

## 2017-12-28 LAB — PROTEIN ELECTROPHORESIS, SERUM
A/G Ratio: 1.7 (ref 0.7–1.7)
Albumin ELP: 3.6 g/dL (ref 2.9–4.4)
Alpha-1-Globulin: 0.1 g/dL (ref 0.0–0.4)
Alpha-2-Globulin: 0.8 g/dL (ref 0.4–1.0)
Beta Globulin: 0.9 g/dL (ref 0.7–1.3)
Gamma Globulin: 0.3 g/dL — ABNORMAL LOW (ref 0.4–1.8)
Globulin, Total: 2.1 g/dL — ABNORMAL LOW (ref 2.2–3.9)
Total Protein ELP: 5.7 g/dL — ABNORMAL LOW (ref 6.0–8.5)

## 2018-01-15 ENCOUNTER — Other Ambulatory Visit: Payer: Self-pay | Admitting: Urgent Care

## 2018-01-15 DIAGNOSIS — C9001 Multiple myeloma in remission: Secondary | ICD-10-CM

## 2018-01-16 NOTE — Telephone Encounter (Signed)
  Does he need a refill?  M

## 2018-01-24 ENCOUNTER — Inpatient Hospital Stay: Payer: Medicare Other

## 2018-01-24 ENCOUNTER — Other Ambulatory Visit: Payer: Self-pay

## 2018-01-24 ENCOUNTER — Inpatient Hospital Stay: Payer: Medicare Other | Attending: Hematology and Oncology

## 2018-01-24 ENCOUNTER — Encounter: Payer: Self-pay | Admitting: Hematology and Oncology

## 2018-01-24 ENCOUNTER — Inpatient Hospital Stay (HOSPITAL_BASED_OUTPATIENT_CLINIC_OR_DEPARTMENT_OTHER): Payer: Medicare Other | Admitting: Hematology and Oncology

## 2018-01-24 VITALS — BP 123/81 | HR 96 | Temp 95.9°F | Resp 18 | Wt 169.5 lb

## 2018-01-24 DIAGNOSIS — Z87891 Personal history of nicotine dependence: Secondary | ICD-10-CM | POA: Diagnosis not present

## 2018-01-24 DIAGNOSIS — Z9484 Stem cells transplant status: Secondary | ICD-10-CM | POA: Diagnosis not present

## 2018-01-24 DIAGNOSIS — E539 Vitamin B deficiency, unspecified: Secondary | ICD-10-CM | POA: Diagnosis not present

## 2018-01-24 DIAGNOSIS — G8929 Other chronic pain: Secondary | ICD-10-CM | POA: Diagnosis not present

## 2018-01-24 DIAGNOSIS — Z7901 Long term (current) use of anticoagulants: Secondary | ICD-10-CM | POA: Diagnosis not present

## 2018-01-24 DIAGNOSIS — Z5112 Encounter for antineoplastic immunotherapy: Secondary | ICD-10-CM

## 2018-01-24 DIAGNOSIS — C9001 Multiple myeloma in remission: Secondary | ICD-10-CM

## 2018-01-24 DIAGNOSIS — Z7189 Other specified counseling: Secondary | ICD-10-CM

## 2018-01-24 DIAGNOSIS — C9002 Multiple myeloma in relapse: Secondary | ICD-10-CM

## 2018-01-24 LAB — COMPREHENSIVE METABOLIC PANEL
ALT: 16 U/L — ABNORMAL LOW (ref 17–63)
AST: 30 U/L (ref 15–41)
Albumin: 4.1 g/dL (ref 3.5–5.0)
Alkaline Phosphatase: 41 U/L (ref 38–126)
Anion gap: 10 (ref 5–15)
BUN: 21 mg/dL — ABNORMAL HIGH (ref 6–20)
CO2: 21 mmol/L — ABNORMAL LOW (ref 22–32)
Calcium: 9.5 mg/dL (ref 8.9–10.3)
Chloride: 104 mmol/L (ref 101–111)
Creatinine, Ser: 1.37 mg/dL — ABNORMAL HIGH (ref 0.61–1.24)
GFR calc Af Amer: 58 mL/min — ABNORMAL LOW (ref >60)
GFR calc non Af Amer: 50 mL/min — ABNORMAL LOW (ref >60)
Glucose, Bld: 150 mg/dL — ABNORMAL HIGH (ref 65–99)
Potassium: 4.1 mmol/L (ref 3.5–5.1)
Sodium: 135 mmol/L (ref 135–145)
Total Bilirubin: 0.6 mg/dL (ref 0.3–1.2)
Total Protein: 6.2 g/dL — ABNORMAL LOW (ref 6.5–8.1)

## 2018-01-24 LAB — CBC WITH DIFFERENTIAL/PLATELET
Basophils Absolute: 0 10*3/uL (ref 0–0.1)
Basophils Relative: 0 %
Eosinophils Absolute: 0.1 10*3/uL (ref 0–0.7)
Eosinophils Relative: 1 %
HCT: 34.9 % — ABNORMAL LOW (ref 40.0–52.0)
Hemoglobin: 12.5 g/dL — ABNORMAL LOW (ref 13.0–18.0)
Lymphocytes Relative: 17 %
Lymphs Abs: 1.4 10*3/uL (ref 1.0–3.6)
MCH: 36.6 pg — ABNORMAL HIGH (ref 26.0–34.0)
MCHC: 35.7 g/dL (ref 32.0–36.0)
MCV: 102.5 fL — ABNORMAL HIGH (ref 80.0–100.0)
Monocytes Absolute: 0.4 10*3/uL (ref 0.2–1.0)
Monocytes Relative: 5 %
Neutro Abs: 6.1 10*3/uL (ref 1.4–6.5)
Neutrophils Relative %: 77 %
Platelets: 182 10*3/uL (ref 150–440)
RBC: 3.4 MIL/uL — ABNORMAL LOW (ref 4.40–5.90)
RDW: 15 % — ABNORMAL HIGH (ref 11.5–14.5)
WBC: 7.9 10*3/uL (ref 3.8–10.6)

## 2018-01-24 LAB — MAGNESIUM: Magnesium: 1.7 mg/dL (ref 1.7–2.4)

## 2018-01-24 MED ORDER — SODIUM CHLORIDE 0.9 % IV SOLN
1200.0000 mg | Freq: Once | INTRAVENOUS | Status: AC
  Administered 2018-01-24: 1200 mg via INTRAVENOUS
  Filled 2018-01-24: qty 60

## 2018-01-24 MED ORDER — SODIUM CHLORIDE 0.9% FLUSH
10.0000 mL | INTRAVENOUS | Status: DC | PRN
  Administered 2018-01-24: 10 mL via INTRAVENOUS
  Filled 2018-01-24: qty 10

## 2018-01-24 MED ORDER — SODIUM CHLORIDE 0.9 % IV SOLN
Freq: Once | INTRAVENOUS | Status: AC
  Administered 2018-01-24: 10:00:00 via INTRAVENOUS
  Filled 2018-01-24: qty 1000

## 2018-01-24 MED ORDER — HEPARIN SOD (PORK) LOCK FLUSH 100 UNIT/ML IV SOLN
500.0000 [IU] | Freq: Once | INTRAVENOUS | Status: AC
  Administered 2018-01-24: 500 [IU] via INTRAVENOUS

## 2018-01-24 NOTE — Progress Notes (Signed)
Here for follow up. stated overall doing well. " Im as good as it gets " per pt

## 2018-01-24 NOTE — Progress Notes (Signed)
[facility] day:  01/24/18  Chief Complaint: Logan Perez is a 72 y.o. 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   HPI: The patient was last seen in the medical oncology clinic on 12/27/2017.  At that time, he felt "good". Patient continues to have chronic pain. He had loose stools managed with PRN loperamide.  Exam was stable.  WBC was 8200 with an [place] of 5500. Hemoglobin was 12.0, hematocrit 34.3, platelets 188,000.  Creatinine was 1.42 (CrCl 46.7 mL/min).  M-spike was 0.  He received daratumumab.  During the interim, he has done well.  He feels "a little better".  He received his second shingles shot.  He notes restless legs at night.  Diarrhea is under control.  He is not using Imodium.  He denies any bone pain, fever or infections.   Past Medical History:  Diagnosis Date  . Anxiety   . Atrial fibrillation ([place])   . BPH (benign prostatic hyperplasia)   . Complication of anesthesia    BAD HEADACHE NIGHT OF FIRST CATARACT  . Compression fracture of lumbar vertebra ([place])   . Difficulty voiding   . Dysrhythmia    A FIB  . Elevated PSA   . GERD (gastroesophageal reflux disease)   . Hearing aid worn    bilateral  . History of kidney stones   . HLD (hyperlipidemia)   . HOH (hard of hearing)   . Hypertension   . Multiple myeloma ([place])   . Neuropathy    feet. 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  . Pain    BACK  . Palpitations   . Pneumonia   . Pulmonary embolism ([place])   . Sepsis ([place])   . Stroke [facility])    TIA, detected on CT scan. pt was unaware    Past Surgical History:  Procedure Laterality Date  . BACK SURGERY  1994  . CATARACT EXTRACTION W/PHACO Right 05/02/2016   Procedure: CATARACT EXTRACTION PHACO AND INTRAOCULAR LENS PLACEMENT (IOC);  Surgeon: Birder Robson, MD;   Location: [facility] ORS;  Service: Ophthalmology;  Laterality: Right;  [place] 1.06 AP% 20.6 CDE 13.70 FLUID PACK LOT # [id number]  . CATARACT EXTRACTION W/PHACO Left 05/16/2016   Procedure: CATARACT EXTRACTION PHACO AND INTRAOCULAR LENS PLACEMENT (IOC);  Surgeon: Birder Robson, MD;  Location: [facility] ORS;  Service: Ophthalmology;  Laterality: Left;  [place] 01:43 AP% 19.8 CDE 20.45 FLUID PACK LOT #[id number] H  . COLONOSCOPY WITH PROPOFOL N/A 03/22/2017   Procedure: COLONOSCOPY WITH PROPOFOL;  Surgeon: Lucilla Lame, MD;  Location: [place];  Service: Endoscopy;  Laterality: N/A;  has port  . ESOPHAGOGASTRODUODENOSCOPY (EGD) WITH PROPOFOL  03/22/2017   Procedure: ESOPHAGOGASTRODUODENOSCOPY (EGD) WITH PROPOFOL;  Surgeon: Lucilla Lame, MD;  Location: [place];  Service: Endoscopy;;  . EYE SURGERY    . KNEE ARTHROSCOPY Left 1992  . LIMBAL STEM CELL TRANSPLANT    . PAIN PUMP IMPLANTATION  2012  . PAIN PUMP IMPLANTATION N/A 09/04/2017   Procedure: INTRATHECAL PUMP BATTERY CHANGE;  Surgeon: Milinda Pointer, MD;  Location: [facility] OR[facility];  Service: Neurosurgery;  Laterality: N/A;  . PORTA CATH INSERTION N/A 12/04/2016   Procedure: Glori Luis Cath Insertion;  Surgeon: Algernon Huxley, MD;  Location: [place] [facility];  Service: Cardiovascular;  Laterality: N/A;  . stem cell implant  2008   [facility]  Family History  Problem Relation Age of Onset  . Cancer Father        throat  . Kidney disease Sister   . Stroke Unknown   . Stroke Mother   . Bladder Cancer Neg Hx   . Prostate cancer Neg Hx   . Kidney cancer Neg Hx     Social History:  reports that he quit smoking about 42 years ago. His smoking use included cigarettes. He has a 30.00 pack-year smoking history. He has never used smokeless tobacco. He reports that he does not drink alcohol or use drugs.  He has a wire haired dachshund.  He lives in [place].  His wife's name is [name].  The patient is alone today.  Allergies:  Allergies  Allergen  Reactions  . Azithromycin Diarrhea and Other (See Comments)    Possible cause of C. Diff Possible cause of C. Diff Possible cause of C. Diff Possible cause of C. Diff  . Bee Pollen Other (See Comments)    Sneezing, watery eyes, runny nose  . Pollen Extract Other (See Comments)    Sneezing, watery eyes, runny nose  . 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 Rivaroxaban Rash    Current Medications: Current Outpatient Medications  Medication Sig Dispense Refill  . acetaminophen (TYLENOL) 325 MG tablet Take 650 mg every 6 (six) hours as needed by mouth for mild pain or headache.     . ALPRAZolam (XANAX) 0.25 MG tablet TAKE 1 TABLET BY MOUTH EVERY NIGHT AT BEDTIME 30 tablet 0  . apixaban (ELIQUIS) 5 MG TABS tablet Take 1 tablet (5 mg total) by mouth 2 (two) times daily. 60 tablet 5  . Calcium Carb-Cholecalciferol (CALCIUM-VITAMIN D) 500-400 MG-UNIT TABS Take 1 tablet daily by mouth.    . cetirizine (ZYRTEC) 10 MG tablet Take 10 mg by mouth daily as needed for allergies.     . Cholecalciferol (VITAMIN D3) 2000 units capsule Take 2,000 Units daily by mouth.     . Daratumumab (DARZALEX IV) Inject 1,200 mg every 30 (thirty) days into the vein.     [name] dexamethasone (DECADRON) 4 MG tablet Take 4 tabs PO 1 hr prior to infusion, then take 1 tab on day 2 and day 3 30 tablet 1  . diltiazem (CARDIZEM CD) 120 MG 24 hr capsule Take 120 mg daily by mouth.     . diphenhydrAMINE (BENADRYL) 25 MG tablet Take 25 mg by mouth as directed. With chemo treatment    . ENSURE (ENSURE) Take 237 mLs by mouth daily.    . fluticasone (FLONASE) 50 MCG/ACT nasal spray Place 1 spray into the nose daily as needed for allergies.     . Hypromellose (ARTIFICIAL TEARS OP) Place 1 drop as needed into both eyes (for dry eyes).    [name] loperamide (IMODIUM) 2 MG capsule Take 4 mg as needed by mouth for diarrhea or loose stools.     . lovastatin (MEVACOR) 20 MG tablet Take 20  mg by mouth every evening.     . montelukast (SINGULAIR) 10 MG tablet Take 51ms daily the day before the infusion, the day of, and the 2 days after infusion 30 tablet 0  . Multiple Vitamins-Iron (MULTIVITAMIN/IRON PO) Take 1 tablet daily by mouth.    .Marland Kitchenomeprazole (PRILOSEC) 20 MG capsule Take 20 mg by mouth daily.     . ondansetron (ZOFRAN) 8 MG tablet Take 8 mg by mouth  as directed. Take with chemo and can take it as needed    . potassium chloride SA (K-DUR,KLOR-CON) 20 MEQ tablet TAKE 1 TABLET BY MOUTH DAILY 90 tablet 0  . tamsulosin (FLOMAX) 0.4 MG CAPS capsule Take 1 capsule (0.4 mg total) by mouth daily. 90 capsule 4  . valACYclovir (VALTREX) 500 MG tablet TAKE 1 TABLET BY MOUTH DAILY 30 tablet 3  . vitamin B-12 (CYANOCOBALAMIN) 1000 MCG tablet Take 1,000 mcg daily by mouth.     . baclofen (LIORESAL) 10 MG tablet Take 1 tablet (10 mg total) by mouth at bedtime. 30 tablet 5  . diltiazem (CARDIZEM) 60 MG tablet Take 60 mg daily as needed by mouth (for increased heart rate > 140).     [name] PAIN MANAGEMENT IT PUMP REFILL 1 each by Intrathecal route once for 1 dose. Medication: PF Fentanyl 1,500.0 mcg/ml PF Bupivicaine 30.0 mg/ml PF Clonidine 300.0 mcg/ml Total Volume: 40 ml Needed by 12-05-17  1000 1 each 0  . ranitidine (ZANTAC) 150 MG tablet Take 150 mg by mouth at bedtime.      No current facility-administered medications for this visit.     Review of Systems:  GENERAL:  Feels good.  No fevers, sweats.  Weight down 1 pound. PERFORMANCE STATUS (ECOG):  1 HEENT:  No visual changes, runny nose, sore throat, mouth sores or tenderness. Lungs: No shortness of breath or cough.  No hemoptysis. Cardiac:  No chest pain, palpitations, orthopnea, or PND. GI:  Diarrhea, under control.  No nausea, vomiting, constipation, melena or hematochezia. GU:  No urgency, frequency, dysuria, or hematuria. Musculoskeletal:  Compression fracture of back on pain pump.  No muscle tenderness. Extremities:  No pain  or swelling. Skin:  s/p Moh's surgery nose.  No rashes or skin changes. Neuro:  Neuropathy in feet.  Hands sensitive.  No headache, numbness or weakness, balance or coordination issues. Endocrine:  No diabetes, thyroid issues, hot flashes or night sweats. Psych:  No mood changes, depression or anxiety. Pain:  Chronic back pain well managed with pump (2 out of 10 today). Review of systems:  All other systems reviewed and found to be negative.   Physical Exam: Blood pressure 123/81, pulse 96, temperature (!) 95.9 F (35.5 C), temperature source Tympanic, resp. rate 18, weight 169 lb 8 oz (76.9 kg). GENERAL:  Well developed, well nourished, gentleman sitting comfortably in the exam room in no acute distress. MENTAL STATUS:  Alert and oriented to person, place and time. HEAD:  Short brown hair with graying goatee.  Normocephalic, atraumatic, face symmetric, no Cushingoid features. EYES:  Glasses.  Blue eyes s/p cataract surgery. Pupils equal round and reactive to light and accomodation.  No conjunctivitis or scleral icterus. ENT:  Hearing aide.  Oropharynx clear without lesion.  Tongue normal. Mucous membranes moist.  RESPIRATORY:  Clear to auscultation without rales, wheezes or rhonchi. CARDIOVASCULAR:  Regular rate and rhythm without murmur, rub or gallop. ABDOMEN:  RUQ pain pump.  Soft, non-tender, with active bowel sounds, and no hepatosplenomegaly.  No masses. SKIN:  Small area of transplanted skin on tip of nose s/p Moh's surgery.  No rashes, ulcers or lesions. EXTREMITIES: No edema, no skin discoloration or tenderness.  No palpable cords. LYMPH NODES: No palpable cervical, supraclavicular, axillary or inguinal adenopathy  NEUROLOGICAL: Unremarkable. PSYCH:  Appropriate.    Infusion on 01/24/2018  Component Date Value Ref Range Status  . Total Protein ELP 01/24/2018 5.9* 6.0 - 8.5 g/dL Final  . Albumin ELP 01/24/2018 3.8  2.9 - 4.4 g/dL Final  . Alpha-1-Globulin 01/24/2018 0.1  0.0 -  0.4 g/dL Final  . Alpha-2-Globulin 01/24/2018 0.8  0.4 - 1.0 g/dL Final  . Beta Globulin 01/24/2018 0.9  0.7 - 1.3 g/dL Final  . Gamma Globulin 01/24/2018 0.2* 0.4 - 1.8 g/dL Final  . M-Spike, % 01/24/2018 Not Observed  Not Observed g/dL Final  . SPE Interp. 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 Serum immunoelectrophoresis and examination of the urine for Bence Jones protein may be indicated if warranted by the clinical picture. Performed At: [facility] [place], [place] [id number] Rush Farmer MD PP:[id number]   . Comment 01/24/2018 Comment   Final   Comment: (NOTE) Protein electrophoresis scan will follow via computer, mail, or courier delivery.   [name] GLOBULIN, TOTAL 01/24/2018 2.1* 2.2 - 3.9 g/dL Corrected  . A/G Ratio 01/24/2018 1.8* 0.7 - 1.7 Corrected   Performed at [facility], [address] [place], [place] [address]  . Magnesium 01/24/2018 1.7  1.7 - 2.4 mg/dL Final   Performed at [facility], [address] [place], [place] [address]  . Sodium 01/24/2018 135  135 - 145 mmol/L Final  . Potassium 01/24/2018 4.1  3.5 - 5.1 mmol/L Final  . Chloride 01/24/2018 104  101 - 111 mmol/L Final  . CO2 01/24/2018 21* 22 - 32 mmol/L Final  . Glucose, Bld 01/24/2018 150* 65 - 99 mg/dL Final  . BUN 01/24/2018 21* 6 - 20 mg/dL Final  . Creatinine, Ser 01/24/2018 1.37* 0.61 - 1.24 mg/dL Final  . Calcium 01/24/2018 9.5  8.9 - 10.3 mg/dL Final  . Total Protein 01/24/2018 6.2* 6.5 - 8.1 g/dL Final  . Albumin 01/24/2018 4.1  3.5 - 5.0 g/dL Final  . AST 01/24/2018 30  15 - 41 U/L Final  . ALT 01/24/2018 16* 17 - 63 U/L Final  . Alkaline Phosphatase 01/24/2018 41  38 - 126 U/L Final  . Total Bilirubin 01/24/2018 0.6  0.3 - 1.2 mg/dL Final  . GFR calc non Af Amer 01/24/2018 50* >60 mL/min Final  . GFR calc Af Amer 01/24/2018 58* >60 mL/min Final    Comment: (NOTE) The eGFR has been calculated using the CKD EPI equation. This calculation has not been validated in all clinical situations. eGFR's persistently <60 mL/min signify possible Chronic Kidney Disease.   Georgiann Hahn gap 01/24/2018 10  5 - 15 Final   Performed at [facility], [address] [place], [place] [address]  . WBC 01/24/2018 7.9  3.8 - 10.6 K/uL Final  . RBC 01/24/2018 3.40* 4.40 - 5.90 MIL/uL Final  . Hemoglobin 01/24/2018 12.5* 13.0 - 18.0 g/dL Final  . HCT 01/24/2018 34.9* 40.0 - 52.0 % Final  . MCV 01/24/2018 102.5* 80.0 - 100.0 fL Final  . MCH 01/24/2018 36.6* 26.0 - 34.0 pg Final  . MCHC 01/24/2018 35.7  32.0 - 36.0 g/dL Final  . RDW 01/24/2018 15.0* 11.5 - 14.5 % Final  . Platelets 01/24/2018 182  150 - 440 K/uL Final  . Neutrophils Relative % 01/24/2018 77  % Final  . Neutro Abs 01/24/2018 6.1  1.4 - 6.5 K/uL Final  . Lymphocytes Relative 01/24/2018 17  % Final  . Lymphs Abs 01/24/2018 1.4  1.0 - 3.6 K/uL Final  . Monocytes Relative 01/24/2018 5  % Final  . Monocytes Absolute 01/24/2018 0.4  0.2 - 1.0 K/uL  Final  . Eosinophils Relative 01/24/2018 1  % Final  . Eosinophils Absolute 01/24/2018 0.1  0 - 0.7 K/uL Final  . Basophils Relative 01/24/2018 0  % Final  . Basophils Absolute 01/24/2018 0.0  0 - 0.1 K/uL Final   Performed at [facility], [address], [place], [place] [address]    Assessment:  Logan Perez is a 72 y.o. male with stage III mutiple myeloma.  He initially presented with progressive back pain beginning in 12/2006.  MRI revealed "spots and compression fractures".  He began Velcade, thalidomide, and Decadron.  In 08/2007, he underwent high dose chemotherapy and autologous stem cell transplant.  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  He recurred with a rising M-spike (2.7) with repeat M spike (1.7 gm/dl) in 03/2010.  He was initially treated with Velcade (02/08/2010 - 05/10/2010).  He then  began Revlimid (15 mg 3 weeks on/1 week off) and Decadron (40 mg on day 1, 8, 15, 22).  Because of significant side effect with Decadron his dose was decreased to 10 mg once a week in 07/2010.   He was on maintenance Revlimid. Revlimid was initially10 mg 3 weeks on/1 week off. This was changed to 10 mg 2 weeks on/2 weeks off secondary to right nipple tenderness. His dose was increased to 10 mg 3 weeks on/1 week off with Decadron 10 mg a week (on Sundays) and then Revlamid 15 mg 3 weeks on and 1 week off with Decadron on Sundays.  He began Pomalyst 4 mg 3 weeks on/1 week off with Decadron on 08/27/2015.  Over the past year his SPEP has revealed no monoclonal protein (04/21/2015) and 0.5 gm/dL on 09/22/2015 and 10/20/2015. M spike was 0.1 on 02/02/2016, 03/01/2016, 03/29/2016, 04/26/2016, and 05/24/2016.  M spike was 0 on 10/11/2016, 11/28/2016, 02/05/2017, 03/21/2017, 08/02/2017, 10/04/2017, 12/27/2017, and 01/24/2018.  M spike was 0.1 on 11/01/2017 and 0.1 on 11/29/2017.   Free light chains have been monitored. Kappa free light chains were 18.54 on 11/21/2013, 18.37 on 02/20/2014, 18.93 on 05/22/2014, 32.58 (high; normal ratio 1.27) on 08/21/2014, 51.53 (high; elevated ratio of 2.12) on 11/13/2014, 28.08 (ratio 1.73) on 12/09/2014, 23.71 (ratio 2.17) on 01/18/2015, 92.93 (ratio 9.49) on 04/21/2015, 93.44 (ratio 10.28) on 05/26/2015, 255.45 (ratio of 24.05) on 07/14/2015, 373.89 (ratio 48.31) on 08/04/2015, 474.33 (ratio 70.58) on 08/25/2015, 450.76 (ratio 55.44) on 09/22/2015, 453.4 (ratio 59.89) on 10/20/2015, 58.26 (ratio of > 40.74) on 02/02/2016, 62.67 (ratio of 37.98) on 03/01/2016, 75.7 (ratio > 50.47) on 03/29/2016, 79.7 (ratio 53.13) on 04/26/2016, 108.6 (ratio > 72.4) on 05/24/2016, 8.3 (1.46 ratio) on 10/11/2016, 6.7 (0.81 ratio) on 02/05/2017, 7.8 (1.01 ratio) on 03/21/2017,7.6 (ratio 0.63) on 06/01/2017, and 8.1 (ratio 1.13) on 11/29/2017.  Bone survey on 12/08/2014 was stable.  Bone survey  on 10/21/2015 revealed increase conspicuity of subcentimeter lytic lesions in the calvarium.  Bone marrow aspirate and biopsy on 11/04/2015 revealed an atypical monoclonal plasma cells estimated at 30-40% of marrow cells.   Marrow was variably cellular (approximately 45%) with background trilineage hematopoiesis. There was no significant increase in marrow reticulin fibers. Storage iron was present.    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 He develoed herpes zoster in 04/2008. He developed a pulmonary embolism in 05/2013. He was initially on Xarelto, but is now on Eliquis. He had an episode of pneumonia around this time requiring a brief admission. He developed severe lower leg cramps on 08/07/2014  secondary to hypokalemia. Duplex was negative.   He was treated for C difficile colitis (Flagyl completed 07/30/2015).  He has a chronic indwelling pain pump.  He received 4 cycles of Pomalyst and Decadron (08/27/2015 - 11/19/2015).  Restaging studies document progressive disease.  Kappa free light chains are increasing.  SPEP revealed 0.5 gm/dL monoclonal protein then 1.3 gm/dL.  Bone survey reveals increase conspicuity of subcentimeter lytic lesions in the calvarium.  Bone marrow reveals 30-40% plasma cells.   MUGA on 11/30/2015 revealed an ejection fraction of 46%.  He is felt not to be a good candidate for Kyprolis.  There were no focal wall motion abnormalities.  He had a stress echo less than 1 year ago.  He has a history of PVCs and atrial fibrillation.  He takes Cardizem prn.  He received 17 weeks of daratumumab (Darzalex) (12/09/2015 - 05/25/2016).  He tolerated treatment well without side effect.  Bone marrow on 02/09/2016 revealed no diagnostic morphologic evidence of plasma cell myeloma.  Marrow was normocellular to hypocellular marrow for age (ranging from 10-40%) with maturing trilineage hematopoiesis and mild multilineage  dyspoiesis.  There was patchy mild increase in reticulin.  Storage iron was present.  Flow cytometry revealed no definitive evidence of monoclonality.  There was a non-specific atypical myeloid and monocytic findings with no increase in blasts.  Cytogenetics were normal (46, XY).  He is currently 19 months s/p 2nd autologous stem cell transplant on 06/16/2016.  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  He also experienced atrial fibrillation with intermittent episodes of RVR requiring IV beta blockers.  He is on prophylactic valacyclovir for 1 year post transplant.  He started vaccinations (DTaP-Pediatric triple vaccine, Hep B- Pediatrix triple vaccine, Haemophilus influenza B (Hib), inactivated polio virus (IPV), pneumococcal conjugate vaccine 13-valent (PCV 13)) on 04/17/2017.  Recommendation was for Shingrix vaccine to be given in [place] and second dose of Shingrix in 2 months.  He had follow-up vaccinations on 08/28/2017.  He had his second shingles vaccine.   He is s/p 13 cycles of Daratumumab (Darzalex) post transplant (12/05/2016 - 12/27/2017).  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  Work-up on 08/23/2016 revealed the following normal studies:  B12 and folate.  Ferritin was 1379 (high).  Iron studies included a saturation of 56% and TIBC 141 (low).  ESR was 64.  Thrombocytopenia has resolved.  He has a history of hypomagnesemia and receives IV magnesium (2-4 gm) weekly.  He has not required magnesium since 10/24/2016.  Symptomatically, he feels "good". Patient has chronic back pain. He has loose stools managed with PRN loperamide.  Exam is stable.  WBC is 7900 with an [place] of 6100. Hemoglobin is 12.5, hematocrit 34.9, platelets 182,000.  Creatinine is 1.37 (CrCl 48.3 mL/min).  M-spike is 0.  Plan:  1.  Labs today:  CBC with diff, CMP, Mg, SPEP. 2.  Patient to take own premeds from home:  Ondansetron (8 mg),  Tylenol (650 mg), and Benadryl (50 mg). 3.  Blood counts stable and adequate for treatment. Cycle #14 daratumumab today. 4.  Continue Eliquis 5 mg BID as previously scheduled.  5.  RTC in 4 weeks for MD assessment, labs (CBC with diff, CMP, Mg, SPEP), and cycle #15 daratumumab.   Lequita Asal, MD 01/24/2018 4:35 PM

## 2018-01-25 LAB — PROTEIN ELECTROPHORESIS, SERUM
A/G Ratio: 1.8 — ABNORMAL HIGH (ref 0.7–1.7)
Albumin ELP: 3.8 g/dL (ref 2.9–4.4)
Alpha-1-Globulin: 0.1 g/dL (ref 0.0–0.4)
Alpha-2-Globulin: 0.8 g/dL (ref 0.4–1.0)
Beta Globulin: 0.9 g/dL (ref 0.7–1.3)
Gamma Globulin: 0.2 g/dL — ABNORMAL LOW (ref 0.4–1.8)
Globulin, Total: 2.1 g/dL — ABNORMAL LOW (ref 2.2–3.9)
Total Protein ELP: 5.9 g/dL — ABNORMAL LOW (ref 6.0–8.5)

## 2018-01-29 ENCOUNTER — Telehealth: Payer: Self-pay | Admitting: *Deleted

## 2018-01-29 NOTE — Telephone Encounter (Signed)
-----   Message from Lequita Asal, MD sent at 01/29/2018  5:40 AM EDT ----- Regarding: Please call patient  M-spike 0.  M  ----- Message ----- From: Interface, Lab In [place] Sent: 01/24/2018   8:49 AM To: Lequita Asal, MD

## 2018-01-29 NOTE — Telephone Encounter (Signed)
Called patient to inform him that his M spike is 0.  Patient very grateful for call.

## 2018-02-01 ENCOUNTER — Other Ambulatory Visit: Payer: Self-pay | Admitting: Pain Medicine

## 2018-02-01 DIAGNOSIS — M62838 Other muscle spasm: Secondary | ICD-10-CM

## 2018-02-04 ENCOUNTER — Other Ambulatory Visit: Payer: Self-pay | Admitting: Nurse Practitioner

## 2018-02-04 ENCOUNTER — Telehealth: Payer: Self-pay | Admitting: *Deleted

## 2018-02-04 DIAGNOSIS — M62838 Other muscle spasm: Secondary | ICD-10-CM

## 2018-02-04 MED ORDER — BACLOFEN 10 MG PO TABS: 10.0000 mg | ORAL_TABLET | Freq: Every day | ORAL | 5 refills | Status: DC

## 2018-02-04 NOTE — Telephone Encounter (Signed)
please remind him that this is to be done on the day of visit.  Per Dr Delane Ginger. Policy. Sent

## 2018-02-04 NOTE — Telephone Encounter (Signed)
Patient did not get his baclofen last time he was here for pump refill.  Can you send it in for him?

## 2018-02-12 ENCOUNTER — Other Ambulatory Visit: Payer: Self-pay | Admitting: Urgent Care

## 2018-02-16 ENCOUNTER — Other Ambulatory Visit: Payer: Self-pay | Admitting: Urgent Care

## 2018-02-16 ENCOUNTER — Encounter: Payer: Self-pay | Admitting: Urgent Care

## 2018-02-16 DIAGNOSIS — C9001 Multiple myeloma in remission: Secondary | ICD-10-CM

## 2018-02-17 ENCOUNTER — Other Ambulatory Visit: Payer: Self-pay | Admitting: Urgent Care

## 2018-02-17 DIAGNOSIS — C9001 Multiple myeloma in remission: Secondary | ICD-10-CM

## 2018-02-18 ENCOUNTER — Other Ambulatory Visit: Payer: Self-pay

## 2018-02-18 MED ORDER — PAIN MANAGEMENT IT PUMP REFILL: 1.0000 | Freq: Once | INTRATHECAL | 0 refills | Status: DC

## 2018-02-20 NOTE — Progress Notes (Signed)
[facility] day:  02/21/18  Chief Complaint: Kailon Treese is a 72 y.o. 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   HPI: The patient was last seen in the medical oncology clinic on 01/24/2018.  At that time, patient was feeling "a little better".  He was status post his second shingles vaccination.  Patient complained of nocturnal restless legs.  Diarrhea was controlled on PRN loperamide. Exam was stable. WBC 7900 ([place]  6100). Hemoglobin was 12.5, hematocrit 34.9, platelets 182,000.  Creatinine 1.37 (CrCl 48.3 mL/min).  M-spike is 0.  He received cycle #14 daratumumab.  Patient was seen by Dr Carleene Mains at the [facility] on 02/05/2018.  Eighteen month vaccines were given.  He was doing well.  He is scheduled for follow-up in 6 months.  In the interim, he has done well.  He notes that his weight is a "new high".  He is eating well.  He denies any bone pain or interval infections.     Past Medical History:  Diagnosis Date  . Anxiety   . Atrial fibrillation ([place])   . BPH (benign prostatic hyperplasia)   . Complication of anesthesia    BAD HEADACHE NIGHT OF FIRST CATARACT  . Compression fracture of lumbar vertebra ([place])   . Difficulty voiding   . Dysrhythmia    A FIB  . Elevated PSA   . GERD (gastroesophageal reflux disease)   . Hearing aid worn    bilateral  . History of kidney stones   . HLD (hyperlipidemia)   . HOH (hard of hearing)   . Hypertension   . Multiple myeloma ([place])   . Neuropathy    feet. 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  . Pain    BACK  . Palpitations   . Pneumonia   . Pulmonary embolism ([place])   . Sepsis ([place])   . Stroke [facility])    TIA, detected on CT scan. pt was unaware    Past Surgical History:  Procedure Laterality Date  . BACK SURGERY  1994  . CATARACT  EXTRACTION W/PHACO Right 05/02/2016   Procedure: CATARACT EXTRACTION PHACO AND INTRAOCULAR LENS PLACEMENT (IOC);  Surgeon: Birder Robson, MD;  Location: [facility] ORS;  Service: Ophthalmology;  Laterality: Right;  [place] 1.06 AP% 20.6 CDE 13.70 FLUID PACK LOT # [id number]  . CATARACT EXTRACTION W/PHACO Left 05/16/2016   Procedure: CATARACT EXTRACTION PHACO AND INTRAOCULAR LENS PLACEMENT (IOC);  Surgeon: Birder Robson, MD;  Location: [facility] ORS;  Service: Ophthalmology;  Laterality: Left;  [place] 01:43 AP% 19.8 CDE 20.45 FLUID PACK LOT #[id number] H  . COLONOSCOPY WITH PROPOFOL N/A 03/22/2017   Procedure: COLONOSCOPY WITH PROPOFOL;  Surgeon: Lucilla Lame, MD;  Location: [place];  Service: Endoscopy;  Laterality: N/A;  has port  . ESOPHAGOGASTRODUODENOSCOPY (EGD) WITH PROPOFOL  03/22/2017   Procedure: ESOPHAGOGASTRODUODENOSCOPY (EGD) WITH PROPOFOL;  Surgeon: Lucilla Lame, MD;  Location: [place];  Service: Endoscopy;;  . EYE SURGERY    . KNEE ARTHROSCOPY Left 1992  . LIMBAL STEM CELL TRANSPLANT    . PAIN PUMP IMPLANTATION  2012  . PAIN PUMP IMPLANTATION N/A 09/04/2017   Procedure: INTRATHECAL PUMP BATTERY CHANGE;  Surgeon: Milinda Pointer, MD;  Location: [facility] OR[facility];  Service: Neurosurgery;  Laterality: N/A;  . PORTA CATH INSERTION N/A 12/04/2016   Procedure: Glori Luis Cath Insertion;  Surgeon: Algernon Huxley, MD;  Location: [place] [facility];  Service: Cardiovascular;  Laterality: N/A;  . stem cell implant  2008   [facility]    Family History  Problem Relation Age of Onset  . Cancer Father        throat  . Kidney disease Sister   . Stroke Unknown   . Stroke Mother   . Bladder Cancer Neg Hx   . Prostate cancer Neg Hx   . Kidney cancer Neg Hx     Social History:  reports that he quit smoking about 42 years ago. His smoking use included cigarettes. He has a 30.00 pack-year smoking history. He has never used smokeless tobacco. He reports that he does not drink alcohol or use drugs.  He has a  wire haired dachshund.  He lives in [place].  His wife's name is [name].  The patient is alone today.  Allergies:  Allergies  Allergen Reactions  . Azithromycin Diarrhea and Other (See Comments)    Possible cause of C. Diff Possible cause of C. Diff Possible cause of C. Diff Possible cause of C. Diff  . Bee Pollen Other (See Comments)    Sneezing, watery eyes, runny nose  . Pollen Extract Other (See Comments)    Sneezing, watery eyes, runny nose  . 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 Rivaroxaban Rash    Current Medications: Current Outpatient Medications  Medication Sig Dispense Refill  . acetaminophen (TYLENOL) 325 MG tablet Take 650 mg every 6 (six) hours as needed by mouth for mild pain or headache.     . ALPRAZolam (XANAX) 0.25 MG tablet TAKE 1 TABLET BY MOUTH EVERY NIGHT AT BEDTIME 30 tablet 0  . apixaban (ELIQUIS) 5 MG TABS tablet Take 1 tablet (5 mg total) by mouth 2 (two) times daily. 60 tablet 5  . baclofen (LIORESAL) 10 MG tablet Take 1 tablet (10 mg total) by mouth at bedtime. 30 tablet 5  . Calcium Carb-Cholecalciferol (CALCIUM-VITAMIN D) 500-400 MG-UNIT TABS Take 1 tablet daily by mouth.    . Cholecalciferol (VITAMIN D3) 2000 units capsule Take 2,000 Units daily by mouth.     . Daratumumab (DARZALEX IV) Inject 1,200 mg every 30 (thirty) days into the vein.     [name] dexamethasone (DECADRON) 4 MG tablet Take 4 tabs PO 1 hr prior to infusion, then take 1 tab on day 2 and day 3 30 tablet 1  . diltiazem (CARDIZEM CD) 120 MG 24 hr capsule Take 120 mg daily by mouth.     . diphenhydrAMINE (BENADRYL) 25 MG tablet Take 25 mg by mouth as directed. With chemo treatment    . ENSURE (ENSURE) Take 237 mLs by mouth daily.    [name] lovastatin (MEVACOR) 20 MG tablet Take 20 mg by mouth every evening.     . montelukast (SINGULAIR) 10 MG tablet Take 98ms daily the day before the infusion, the day of, and the 2 days after infusion  30 tablet 0  . Multiple Vitamins-Iron (MULTIVITAMIN/IRON PO) Take 1 tablet daily by mouth.    .Marland Kitchenomeprazole (PRILOSEC) 20 MG capsule Take 20 mg by mouth daily.     . potassium chloride SA (K-DUR,KLOR-CON) 20 MEQ tablet TAKE 1 TABLET BY MOUTH DAILY 90 tablet 0  . ranitidine (ZANTAC) 150 MG tablet Take 150 mg by mouth at bedtime.     . tamsulosin (FLOMAX) 0.4 MG CAPS capsule Take 1  capsule (0.4 mg total) by mouth daily. 90 capsule 4  . valACYclovir (VALTREX) 500 MG tablet TAKE 1 TABLET BY MOUTH DAILY 30 tablet 0  . vitamin B-12 (CYANOCOBALAMIN) 1000 MCG tablet Take 1,000 mcg daily by mouth.     . cetirizine (ZYRTEC) 10 MG tablet Take 10 mg by mouth daily as needed for allergies.     [name] diltiazem (CARDIZEM) 60 MG tablet Take 60 mg daily as needed by mouth (for increased heart rate > 140).     . fluticasone (FLONASE) 50 MCG/ACT nasal spray Place 1 spray into the nose daily as needed for allergies.     . Hypromellose (ARTIFICIAL TEARS OP) Place 1 drop as needed into both eyes (for dry eyes).    [name] loperamide (IMODIUM) 2 MG capsule Take 4 mg as needed by mouth for diarrhea or loose stools.     . ondansetron (ZOFRAN) 8 MG tablet Take 8 mg by mouth as directed. Take with chemo and can take it as needed    . PAIN MANAGEMENT IT PUMP REFILL 1 each by Intrathecal route once for 1 dose. Medication: PF Fentanyl 1,500.0 mcg/ml PF Bupivicaine 30.0 mg/ml PF Clonidine 300.0 mcg/ml Total Volume: 40 ml Needed by 03-04-18'@1100'  1 each 0   No current facility-administered medications for this visit.    Review of Systems  Constitutional: Negative.  Negative for malaise/fatigue and weight loss (weight gain of 2 pounds).       Feels great.  HENT: Positive for hearing loss (hearing aide). Negative for congestion, ear pain, nosebleeds, sinus pain, sore throat and tinnitus.   Eyes: Negative.  Negative for blurred vision, double vision and redness.  Respiratory: Negative.  Negative for cough, hemoptysis, sputum production  and shortness of breath.   Cardiovascular: Negative.  Negative for chest pain, palpitations, orthopnea, leg swelling and PND.  Gastrointestinal: Positive for diarrhea (chronic; uses PRN loperamide). Negative for abdominal pain, blood in stool, constipation, heartburn, melena and vomiting.  Genitourinary: Negative.  Negative for dysuria, frequency, hematuria and urgency.  Musculoskeletal: Positive for back pain (known compression fracture; on Baclofen pump). Negative for neck pain.  Skin: Negative.  Negative for itching and rash.  Neurological: Positive for tingling (neuropathy in bilateral feet). Negative for dizziness, tremors, speech change, focal weakness, weakness and headaches.  Endo/Heme/Allergies: Negative.  Does not bruise/bleed easily.  Psychiatric/Behavioral: Negative.  Negative for depression, memory loss and suicidal ideas. The patient is not nervous/anxious and does not have insomnia.   All other systems reviewed and are negative.   Physical Exam: Blood pressure 118/79, pulse 89, temperature 97.9 F (36.6 C), temperature source Tympanic, resp. rate 18, height '5\' 6"'  (1.676 m), weight 171 lb 4.8 oz (77.7 kg), SpO2 97 %. GENERAL:  Well developed, well nourished, gentleman sitting comfortably in the exam room in no acute distress. MENTAL STATUS:  Alert and oriented to person, place and time. HEAD:  Short brown hair with graying goatee.  Normocephalic, atraumatic, face symmetric, no Cushingoid features. EYES:  Glasses.  Blue eyes s/p cataract surgery.  Pupils equal round and reactive to light and accomodation.  No conjunctivitis or scleral icterus. ENT:  Hearing aide.  Oropharynx clear without lesion.  Tongue normal. Mucous membranes moist.  RESPIRATORY:  Clear to auscultation without rales, wheezes or rhonchi. CARDIOVASCULAR:  Regular rate and rhythm without murmur, rub or gallop. ABDOMEN:  RUQ pain pump.  Soft, non-tender, with active bowel sounds, and no hepatosplenomegaly.  No  masses. SKIN:  Small area of transplanted skin on tip  of nose s/p Moh's surgery.  No rashes, ulcers or lesions. EXTREMITIES: No edema, no skin discoloration or tenderness.  No palpable cords. LYMPH NODES: No palpable cervical, supraclavicular, axillary or inguinal adenopathy  NEUROLOGICAL: Unremarkable. PSYCH:  Appropriate.    Infusion on 02/21/2018  Component Date Value Ref Range Status  . Magnesium 02/21/2018 1.9  1.7 - 2.4 mg/dL Final   Performed at [facility], [address], [place], [place] [address]  . Sodium 02/21/2018 138  135 - 145 mmol/L Final  . Potassium 02/21/2018 4.3  3.5 - 5.1 mmol/L Final  . Chloride 02/21/2018 105  101 - 111 mmol/L Final  . CO2 02/21/2018 24  22 - 32 mmol/L Final  . Glucose, Bld 02/21/2018 137* 65 - 99 mg/dL Final  . BUN 02/21/2018 21* 6 - 20 mg/dL Final  . Creatinine, Ser 02/21/2018 1.53* 0.61 - 1.24 mg/dL Final  . Calcium 02/21/2018 9.4  8.9 - 10.3 mg/dL Final  . Total Protein 02/21/2018 6.3* 6.5 - 8.1 g/dL Final  . Albumin 02/21/2018 4.0  3.5 - 5.0 g/dL Final  . AST 02/21/2018 28  15 - 41 U/L Final  . ALT 02/21/2018 15* 17 - 63 U/L Final  . Alkaline Phosphatase 02/21/2018 41  38 - 126 U/L Final  . Total Bilirubin 02/21/2018 0.8  0.3 - 1.2 mg/dL Final  . GFR calc non Af Amer 02/21/2018 44* >60 mL/min Final  . GFR calc Af Amer 02/21/2018 51* >60 mL/min Final   Comment: (NOTE) The eGFR has been calculated using the CKD EPI equation. This calculation has not been validated in all clinical situations. eGFR's persistently <60 mL/min signify possible Chronic Kidney Disease.   Georgiann Hahn gap 02/21/2018 9  5 - 15 Final   Performed at [facility], [address] [place], [place] [address]  . WBC 02/21/2018 8.6  3.8 - 10.6 K/uL Final  . RBC 02/21/2018 3.42* 4.40 - 5.90 MIL/uL Final  . Hemoglobin 02/21/2018 12.6* 13.0 - 18.0 g/dL Final  . HCT 02/21/2018 35.1* 40.0 - 52.0 % Final  . MCV 02/21/2018 102.8* 80.0 - 100.0 fL Final  . MCH  02/21/2018 36.8* 26.0 - 34.0 pg Final  . MCHC 02/21/2018 35.8  32.0 - 36.0 g/dL Final  . RDW 02/21/2018 14.5  11.5 - 14.5 % Final  . Platelets 02/21/2018 209  150 - 440 K/uL Final  . Neutrophils Relative % 02/21/2018 71  % Final  . Neutro Abs 02/21/2018 6.1  1.4 - 6.5 K/uL Final  . Lymphocytes Relative 02/21/2018 20  % Final  . Lymphs Abs 02/21/2018 1.7  1.0 - 3.6 K/uL Final  . Monocytes Relative 02/21/2018 6  % Final  . Monocytes Absolute 02/21/2018 0.5  0.2 - 1.0 K/uL Final  . Eosinophils Relative 02/21/2018 2  % Final  . Eosinophils Absolute 02/21/2018 0.1  0 - 0.7 K/uL Final  . Basophils Relative 02/21/2018 1  % Final  . Basophils Absolute 02/21/2018 0.1  0 - 0.1 K/uL Final   Performed at [facility], [address], [place], [place] [address]    Assessment:  Maliek Schellhorn is a 72 y.o. male with stage III mutiple myeloma.  He initially presented with progressive back pain beginning in 12/2006.  MRI revealed "spots and compression fractures".  He began Velcade, thalidomide, and Decadron.  In 08/2007, he underwent high dose chemotherapy and autologous stem cell transplant.  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  He recurred with a rising  M-spike (2.7) with repeat M spike (1.7 gm/dl) in 03/2010.  He was initially treated with Velcade (02/08/2010 - 05/10/2010).  He then began Revlimid (15 mg 3 weeks on/1 week off) and Decadron (40 mg on day 1, 8, 15, 22).  Because of significant side effect with Decadron his dose was decreased to 10 mg once a week in 07/2010.   He was on maintenance Revlimid. Revlimid was initially10 mg 3 weeks on/1 week off. This was changed to 10 mg 2 weeks on/2 weeks off secondary to right nipple tenderness. His dose was increased to 10 mg 3 weeks on/1 week off with Decadron 10 mg a week (on Sundays) and then Revlamid 15 mg 3 weeks on and 1 week off with Decadron on Sundays.  He began Pomalyst 4 mg 3 weeks on/1 week off with  Decadron on 08/27/2015.  Over the past year his SPEP has revealed no monoclonal protein (04/21/2015) and 0.5 gm/dL on 09/22/2015 and 10/20/2015. M spike was 0.1 on 02/02/2016, 03/01/2016, 03/29/2016, 04/26/2016, and 05/24/2016.  M spike was 0 on 10/11/2016, 11/28/2016, 02/05/2017, 03/21/2017, 08/02/2017, 10/04/2017, 12/27/2017, and 01/24/2018.  M spike was 0.1 on 11/01/2017 and 0.1 on 11/29/2017.   Free light chains have been monitored. Kappa free light chains were 18.54 on 11/21/2013, 18.37 on 02/20/2014, 18.93 on 05/22/2014, 32.58 (high; normal ratio 1.27) on 08/21/2014, 51.53 (high; elevated ratio of 2.12) on 11/13/2014, 28.08 (ratio 1.73) on 12/09/2014, 23.71 (ratio 2.17) on 01/18/2015, 92.93 (ratio 9.49) on 04/21/2015, 93.44 (ratio 10.28) on 05/26/2015, 255.45 (ratio of 24.05) on 07/14/2015, 373.89 (ratio 48.31) on 08/04/2015, 474.33 (ratio 70.58) on 08/25/2015, 450.76 (ratio 55.44) on 09/22/2015, 453.4 (ratio 59.89) on 10/20/2015, 58.26 (ratio of > 40.74) on 02/02/2016, 62.67 (ratio of 37.98) on 03/01/2016, 75.7 (ratio > 50.47) on 03/29/2016, 79.7 (ratio 53.13) on 04/26/2016, 108.6 (ratio > 72.4) on 05/24/2016, 8.3 (1.46 ratio) on 10/11/2016, 6.7 (0.81 ratio) on 02/05/2017, 7.8 (1.01 ratio) on 03/21/2017,7.6 (ratio 0.63) on 06/01/2017, and 8.1 (ratio 1.13) on 11/29/2017.  Bone survey on 12/08/2014 was stable.  Bone survey on 10/21/2015 revealed increase conspicuity of subcentimeter lytic lesions in the calvarium.  Bone marrow aspirate and biopsy on 11/04/2015 revealed an atypical monoclonal plasma cells estimated at 30-40% of marrow cells.   Marrow was variably cellular (approximately 45%) with background trilineage hematopoiesis. There was no significant increase in marrow reticulin fibers. Storage iron was present.    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 He develoed herpes zoster in 04/2008. He developed a pulmonary embolism in  05/2013. He was initially on Xarelto, but is now on Eliquis. He had an episode of pneumonia around this time requiring a brief admission. He developed severe lower leg cramps on 08/07/2014 secondary to hypokalemia. Duplex was negative.   He was treated for C difficile colitis (Flagyl completed 07/30/2015).  He has a chronic indwelling pain pump.  He received 4 cycles of Pomalyst and Decadron (08/27/2015 - 11/19/2015).  Restaging studies document progressive disease.  Kappa free light chains are increasing.  SPEP revealed 0.5 gm/dL monoclonal protein then 1.3 gm/dL.  Bone survey reveals increase conspicuity of subcentimeter lytic lesions in the calvarium.  Bone marrow reveals 30-40% plasma cells.   MUGA on 11/30/2015 revealed an ejection fraction of 46%.  He is felt not to be a good candidate for Kyprolis.  There were no focal wall motion abnormalities.  He had a stress echo less than 1 year ago.  He has a history of  PVCs and atrial fibrillation.  He takes Cardizem prn.  He received 17 weeks of daratumumab (Darzalex) (12/09/2015 - 05/25/2016).  He tolerated treatment well without side effect.  Bone marrow on 02/09/2016 revealed no diagnostic morphologic evidence of plasma cell myeloma.  Marrow was normocellular to hypocellular marrow for age (ranging from 10-40%) with maturing trilineage hematopoiesis and mild multilineage dyspoiesis.  There was patchy mild increase in reticulin.  Storage iron was present.  Flow cytometry revealed no definitive evidence of monoclonality.  There was a non-specific atypical myeloid and monocytic findings with no increase in blasts.  Cytogenetics were normal (46, XY).  He is currently 20 months s/p 2nd autologous stem cell transplant on 06/16/2016.  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  He also experienced atrial fibrillation with intermittent episodes of RVR requiring IV beta blockers.  He is on prophylactic  valacyclovir for 1 year post transplant.  He started vaccinations (DTaP-Pediatric triple vaccine, Hep B- Pediatrix triple vaccine, Haemophilus influenza B (Hib), inactivated polio virus (IPV), pneumococcal conjugate vaccine 13-valent (PCV 13)) on 04/17/2017.  Recommendation was for Shingrix vaccine to be given in [place] and second dose of Shingrix in 2 months.  He had follow-up vaccinations on 08/28/2017.  He had his second shingles vaccine.   He is s/p 14 cycles of Daratumumab (Darzalex) post transplant (12/05/2016 - 01/24/2018).  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  Work-up on 08/23/2016 revealed the following normal studies:  B12 and folate.  Ferritin was 1379 (high).  Iron studies included a saturation of 56% and TIBC 141 (low).  ESR was 64.  Thrombocytopenia has resolved.  He has a history of hypomagnesemia and receives IV magnesium (2-4 gm) weekly.  He has not required magnesium since 10/24/2016.  Symptomatically, he feels good.  He has chronic loose stools controlled with prn loperamide.  He has chronic back pain managed with an implanted pump.  Exam is stable.  WBC is 8600 with an [place] of 6100.  Hemoglobin is 12.6, hematocrit 35.1, platelets 209,000.  Creatinine is 1.53 (CrCl 43.5 mL/min).   Plan:  1.  Labs today:  CBC with diff, CMP, Mg, SPEP. 2.  Patient to take own premeds from home:  Ondansetron (8 mg), Tylenol (650 mg), and Benadryl (50 mg). 3.  Blood counts stable and adequate for treatment. Cycle #15 daratumumab today. 4.  Continue Eliquis 5 mg BID as previously scheduled.  5.  Discuss immunizations given with last visit at [facility].  He will require immunizations (pneumococcal PPV23 or Pneumovax, and MMR) at 24 months. 6.  Discuss upcoming visit to dentist.  Antibiotic prophylaxis.  Patient to call clinic back with last Rx. 7.  Discuss upcoming medication refills (Xanax and valacyclovir). 8.  RTC in 4 weeks for MD assessment, labs (CBC with diff, CMP,  Mg, SPEP), and cycle #16 daratumumab.   Lequita Asal, MD 02/21/18, 9:04 PM

## 2018-02-21 ENCOUNTER — Inpatient Hospital Stay (HOSPITAL_BASED_OUTPATIENT_CLINIC_OR_DEPARTMENT_OTHER): Payer: Medicare Other | Admitting: Hematology and Oncology

## 2018-02-21 ENCOUNTER — Inpatient Hospital Stay: Payer: Medicare Other | Attending: Hematology and Oncology

## 2018-02-21 ENCOUNTER — Encounter: Payer: Self-pay | Admitting: Hematology and Oncology

## 2018-02-21 ENCOUNTER — Inpatient Hospital Stay: Payer: Medicare Other

## 2018-02-21 VITALS — BP 118/79 | HR 89 | Temp 97.9°F | Resp 18 | Ht 66.0 in | Wt 171.3 lb

## 2018-02-21 DIAGNOSIS — Z87891 Personal history of nicotine dependence: Secondary | ICD-10-CM | POA: Diagnosis not present

## 2018-02-21 DIAGNOSIS — C9001 Multiple myeloma in remission: Secondary | ICD-10-CM

## 2018-02-21 DIAGNOSIS — Z5112 Encounter for antineoplastic immunotherapy: Secondary | ICD-10-CM

## 2018-02-21 DIAGNOSIS — Z9484 Stem cells transplant status: Secondary | ICD-10-CM

## 2018-02-21 DIAGNOSIS — D539 Nutritional anemia, unspecified: Secondary | ICD-10-CM

## 2018-02-21 DIAGNOSIS — Z7901 Long term (current) use of anticoagulants: Secondary | ICD-10-CM | POA: Diagnosis not present

## 2018-02-21 DIAGNOSIS — Z86711 Personal history of pulmonary embolism: Secondary | ICD-10-CM

## 2018-02-21 DIAGNOSIS — C9002 Multiple myeloma in relapse: Secondary | ICD-10-CM

## 2018-02-21 LAB — COMPREHENSIVE METABOLIC PANEL
ALT: 15 U/L — ABNORMAL LOW (ref 17–63)
AST: 28 U/L (ref 15–41)
Albumin: 4 g/dL (ref 3.5–5.0)
Alkaline Phosphatase: 41 U/L (ref 38–126)
Anion gap: 9 (ref 5–15)
BUN: 21 mg/dL — ABNORMAL HIGH (ref 6–20)
CO2: 24 mmol/L (ref 22–32)
Calcium: 9.4 mg/dL (ref 8.9–10.3)
Chloride: 105 mmol/L (ref 101–111)
Creatinine, Ser: 1.53 mg/dL — ABNORMAL HIGH (ref 0.61–1.24)
GFR calc Af Amer: 51 mL/min — ABNORMAL LOW (ref >60)
GFR calc non Af Amer: 44 mL/min — ABNORMAL LOW (ref >60)
Glucose, Bld: 137 mg/dL — ABNORMAL HIGH (ref 65–99)
Potassium: 4.3 mmol/L (ref 3.5–5.1)
Sodium: 138 mmol/L (ref 135–145)
Total Bilirubin: 0.8 mg/dL (ref 0.3–1.2)
Total Protein: 6.3 g/dL — ABNORMAL LOW (ref 6.5–8.1)

## 2018-02-21 LAB — CBC WITH DIFFERENTIAL/PLATELET
Basophils Absolute: 0.1 10*3/uL (ref 0–0.1)
Basophils Relative: 1 %
Eosinophils Absolute: 0.1 10*3/uL (ref 0–0.7)
Eosinophils Relative: 2 %
HCT: 35.1 % — ABNORMAL LOW (ref 40.0–52.0)
Hemoglobin: 12.6 g/dL — ABNORMAL LOW (ref 13.0–18.0)
Lymphocytes Relative: 20 %
Lymphs Abs: 1.7 10*3/uL (ref 1.0–3.6)
MCH: 36.8 pg — ABNORMAL HIGH (ref 26.0–34.0)
MCHC: 35.8 g/dL (ref 32.0–36.0)
MCV: 102.8 fL — ABNORMAL HIGH (ref 80.0–100.0)
Monocytes Absolute: 0.5 10*3/uL (ref 0.2–1.0)
Monocytes Relative: 6 %
Neutro Abs: 6.1 10*3/uL (ref 1.4–6.5)
Neutrophils Relative %: 71 %
Platelets: 209 10*3/uL (ref 150–440)
RBC: 3.42 MIL/uL — ABNORMAL LOW (ref 4.40–5.90)
RDW: 14.5 % (ref 11.5–14.5)
WBC: 8.6 10*3/uL (ref 3.8–10.6)

## 2018-02-21 LAB — MAGNESIUM: Magnesium: 1.9 mg/dL (ref 1.7–2.4)

## 2018-02-21 MED ORDER — SODIUM CHLORIDE 0.9 % IV SOLN: 16.0000 mg/kg | Freq: Once | INTRAVENOUS | Status: DC

## 2018-02-21 MED ORDER — SODIUM CHLORIDE 0.9 % IV SOLN
1200.0000 mg | Freq: Once | INTRAVENOUS | Status: AC
  Administered 2018-02-21: 1200 mg via INTRAVENOUS
  Filled 2018-02-21: qty 60

## 2018-02-21 MED ORDER — HEPARIN SOD (PORK) LOCK FLUSH 100 UNIT/ML IV SOLN
500.0000 [IU] | Freq: Once | INTRAVENOUS | Status: AC
  Administered 2018-02-21: 500 [IU] via INTRAVENOUS

## 2018-02-21 MED ORDER — SODIUM CHLORIDE 0.9% FLUSH
10.0000 mL | INTRAVENOUS | Status: DC | PRN
  Administered 2018-02-21: 10 mL via INTRAVENOUS
  Filled 2018-02-21: qty 10

## 2018-02-21 MED ORDER — SODIUM CHLORIDE 0.9 % IV SOLN
Freq: Once | INTRAVENOUS | Status: AC
  Administered 2018-02-21: 09:00:00 via INTRAVENOUS
  Filled 2018-02-21: qty 1000

## 2018-02-21 NOTE — Progress Notes (Signed)
Patient states he took his Benadryl, Tylenol, and Zofran at home at 8 a.m. today

## 2018-02-25 LAB — PROTEIN ELECTROPHORESIS, SERUM
A/G Ratio: 1.7 (ref 0.7–1.7)
Albumin ELP: 3.7 g/dL (ref 2.9–4.4)
Alpha-1-Globulin: 0.1 g/dL (ref 0.0–0.4)
Alpha-2-Globulin: 0.9 g/dL (ref 0.4–1.0)
Beta Globulin: 0.8 g/dL (ref 0.7–1.3)
Gamma Globulin: 0.4 g/dL (ref 0.4–1.8)
Globulin, Total: 2.2 g/dL (ref 2.2–3.9)
Total Protein ELP: 5.9 g/dL — ABNORMAL LOW (ref 6.0–8.5)

## 2018-02-26 NOTE — Progress Notes (Signed)
Patient's Name: Logan Perez  MRN: [id number]  Referring Provider: Sofie Hartigan, MD  DOB: Apr 22, 1946  PCP: Sofie Hartigan, MD  DOS: 02/27/2018  Note by: Gaspar Cola, MD  Service setting: Ambulatory outpatient  Specialty: Interventional Pain Management  Patient type: Established  Location: [facility] (AMB) Pain Management Facility  Visit type: Interventional Procedure   Primary Reason for Visit: Interventional Pain Management Treatment. CC: Back Pain (lower)  Procedure:  Intrathecal Drug Delivery System (IDDS):  Type: Reservoir Refill [phone]) No rate change Region: Abdominal Laterality: Right  Type of Pump: Medtronic Synchromed II (MRI-compatible) Delivery Route: Intrathecal Type of Pain Treated: Neuropathic/Nociceptive Primary Medication Class: Opioid/opiate  Medication, Concentration, Infusion Program, & Delivery Rate: Please see scanned programming printout.   Indications: 1. Chronic pain syndrome   2. Cancer associated pain   3. Chronic low back pain   4. Compression fracture of T12 vertebra (HCC) (70-75% magnitude) (with mild retropulsion)   5. Presence of implanted infusion pump (Medtronic, programmable, intrathecal pump)   6. Encounter for adjustment or management of infusion pump    Pain Assessment: Self-Reported Pain Score: 2 /10             Reported level is compatible with observation.        Intrathecal Pump Therapy Assessment  Manufacturer: Medtronic Synchromed II Type: Programmable Volume: 40 mL reservoir MRI compatibility: Yes   Drug content:  Primary Medication Class: Opioid Primary Medication: PF-Fentanyl Secondary Medication: PF-Bupivacaine Other Medication: PF-Clonidine   Programming:  Type: Simple continuous. See pump readout for details.   Changes:  Medication Change: None at this point Rate Change: No change in rate  Reported side-effects or adverse reactions: None reported  Effectiveness: Described as relatively effective,  allowing for increase in activities of daily living (ADL) Clinically meaningful improvement in function (CMIF): Sustained CMIF goals met  Plan: Pump refill today  Pre-op Assessment:  [name] is a 72 y.o. (year old), male patient, seen today for interventional treatment. He  has a past surgical history that includes Knee arthroscopy (Left, 1992); Pain pump implantation (2012); stem cell implant (2008); Cataract extraction w/PHACO (Right, 05/02/2016); Cataract extraction w/PHACO (Left, 05/16/2016); Limbal stem cell transplant; PORTA CATH INSERTION (N/A, 12/04/2016); Colonoscopy with propofol (N/A, 03/22/2017); Esophagogastroduodenoscopy (egd) with propofol (03/22/2017); Eye surgery; Back surgery (1994); and Pain pump implantation (N/A, 09/04/2017). [name] has a current medication list which includes the following prescription(s): acetaminophen, alprazolam, apixaban, baclofen, calcium-vitamin d, cetirizine, vitamin d3, daratumumab, dexamethasone, diltiazem, diltiazem, diphenhydramine, ensure, fluticasone, hypromellose, loperamide, lovastatin, montelukast, multiple vitamins-iron, omeprazole, ondansetron, potassium chloride sa, ranitidine, tamsulosin, valacyclovir, vitamin b-12, and PAIN MANAGEMENT IT PUMP REFILL. His primarily concern today is the Back Pain (lower)  Initial Vital Signs:  Pulse/HCG Rate: (!) 107  Temp: 98.2 F (36.8 C) Resp: 16 BP: 114/72 SpO2: 100 %  BMI: Estimated body mass index is 26.31 kg/m as calculated from the following:   Height as of this encounter: '5\' 7"'$  (1.702 m).   Weight as of this encounter: 168 lb (76.2 kg).  Risk Assessment: Allergies: Reviewed. He is allergic to azithromycin; bee pollen; pollen extract; zoledronic acid; and rivaroxaban.  Allergy Precautions: None required Coagulopathies: Reviewed. None identified.  Blood-thinner therapy: None at this time Active Infection(s): Reviewed. None identified. [name] is afebrile  Site Confirmation: Mr.  Fanguy was asked to confirm the procedure and laterality before marking the site Procedure checklist: Completed Consent: Before the procedure and under the influence of no sedative(s), amnesic(s), or anxiolytics, the patient was  informed of the treatment options, risks and possible complications. To fulfill our ethical and legal obligations, as recommended by the American Medical Association's Code of Ethics, I have informed the patient of my clinical impression; the nature and purpose of the treatment or procedure; the risks, benefits, and possible complications of the intervention; the alternatives, including doing nothing; the risk(s) and benefit(s) of the alternative treatment(s) or procedure(s); and the risk(s) and benefit(s) of doing nothing.  [name] was provided with information about the general risks and possible complications associated with most interventional procedures. These include, but are not limited to: failure to achieve desired goals, infection, bleeding, organ or nerve damage, allergic reactions, paralysis, and/or death.  In addition, he was informed of those risks and possible complications associated to this particular procedure, which include, but are not limited to: damage to the implant; failure to decrease pain; local, systemic, or serious CNS infections, intraspinal abscess with possible cord compression and paralysis, or life-threatening such as meningitis; bleeding; organ damage; nerve injury or damage with subsequent sensory, motor, and/or autonomic system dysfunction, resulting in transient or permanent pain, numbness, and/or weakness of one or several areas of the body; allergic reactions, either minor or major life-threatening, such as anaphylactic or anaphylactoid reactions.  Furthermore, [name] was informed of those risks and complications associated with the medications. These include, but are not limited to: allergic reactions (i.e.: anaphylactic or  anaphylactoid reactions); endorphine suppression; bradycardia and/or hypotension; water retention and/or peripheral vascular relaxation leading to lower extremity edema and possible stasis ulcers; respiratory depression and/or shortness of breath; decreased metabolic rate leading to weight gain; swelling or edema; medication-induced neural toxicity; particulate matter embolism and blood vessel occlusion with resultant organ, and/or nervous system infarction; and/or intrathecal granuloma formation with possible spinal cord compression and permanent paralysis.  Before refilling the pump Mr. Googe was informed that some of the medications used in the devise may not be FDA approved for such use and therefore it constitutes an off-label use of the medications.  Finally, he was informed that Medicine is not an exact science; therefore, there is also the possibility of unforeseen or unpredictable risks and/or possible complications that may result in a catastrophic outcome. The patient indicated having understood very clearly. We have given the patient no guarantees and we have made no promises. Enough time was given to the patient to ask questions, all of which were answered to the patient's satisfaction. Mr. Feil has indicated that he wanted to continue with the procedure. Attestation: I, the ordering provider, attest that I have discussed with the patient the benefits, risks, side-effects, alternatives, likelihood of achieving goals, and potential problems during recovery for the procedure that I have provided informed consent. Date  Time: 02/27/2018 11:33 AM  Pre-Procedure Preparation:  Monitoring: As per clinic protocol. Respiration, ETCO2, SpO2, BP, heart rate and rhythm monitor placed and checked for adequate function Safety Precautions: Patient was assessed for positional comfort and pressure points before starting the procedure. Time-out: I initiated and conducted the "Time-out" before starting the  procedure, as per protocol. The patient was asked to participate by confirming the accuracy of the "Time Out" information. Verification of the correct person, site, and procedure were performed and confirmed by me, the nursing staff, and the patient. "Time-out" conducted as per Joint Commission's Universal Protocol (UP.01.01.01). Time: 1200  Description of Procedure Process:   Position: Supine Target Area: Central-port of intrathecal pump. Approach: Anterior, 90 degree angle approach. Area Prepped: Entire Area around the pump implant.  Prepping solution: ChloraPrep (2% chlorhexidine gluconate and 70% isopropyl alcohol) Safety Precautions: Aspiration looking for blood return was conducted prior to all injections. At no point did we inject any substances, as a needle was being advanced. No attempts were made at seeking any paresthesias. Safe injection practices and needle disposal techniques used. Medications properly checked for expiration dates. SDV (single dose vial) medications used. Description of the Procedure: Protocol guidelines were followed. Two nurses trained to do implant refills were present during the entire procedure. The refill medication was checked by both healthcare providers as well as the patient. The patient was included in the "Time-out" to verify the medication. The patient was placed in position. The pump was identified. The area was prepped in the usual manner. The sterile template was positioned over the pump, making sure the side-port location matched that of the pump. Both, the pump and the template were held for stability. The needle provided in the Medtronic Kit was then introduced thru the center of the template and into the central port. The pump content was aspirated and discarded volume documented. The new medication was slowly infused into the pump, thru the filter, making sure to avoid overpressure of the device. The needle was then removed and the area cleansed, making  sure to leave some of the prepping solution back to take advantage of its long term bactericidal properties. The pump was interrogated and programmed to reflect the correct medication, volume, and dosage. The program was printed and taken to the physician for approval. Once checked and signed by the physician, a copy was provided to the patient and another scanned into the EMR. Vitals:   02/27/18 1132 02/27/18 1215  BP: 114/72 107/78  Pulse: (!) 107 95  Resp: 16   Temp: 98.2 F (36.8 C)   TempSrc: Oral   SpO2: 100% 100%  Weight: 168 lb (76.2 kg)   Height: _0  (1.702 m)     Start Time: 1200 hrs. End Time: 1213 hrs. Materials & Medications: Medtronic Refill Kit Medication(s): Please see chart orders for details.  Imaging Guidance:  Type of Imaging Technique: None used Indication(s): N/A Exposure Time: No patient exposure Contrast: None used. Fluoroscopic Guidance: N/A Ultrasound Guidance: N/A Interpretation: N/A  Antibiotic Prophylaxis:   Anti-infectives (From admission, onward)   None     Indication(s): None identified  Post-operative Assessment:  Post-procedure Vital Signs:  Pulse/HCG Rate: 95  Temp: 98.2 F (36.8 C) Resp: 16 BP: 107/78 SpO2: 100 %  EBL: None  Complications: No immediate post-treatment complications observed by team, or reported by patient.  Note: The patient tolerated the entire procedure well. A repeat set of vitals were taken after the procedure and the patient was kept under observation following institutional policy, for this type of procedure. Post-procedural neurological assessment was performed, showing return to baseline, prior to discharge. The patient was provided with post-procedure discharge instructions, including a section on how to identify potential problems. Should any problems arise concerning this procedure, the patient was given instructions to immediately contact us, at any time, without hesitation. In any case, we plan to  contact the patient by telephone for a follow-up status report regarding this interventional procedure.  Comments:  No additional relevant information.  Plan of Care   Imaging Orders  No imaging studies ordered today    Procedure Orders     PUMP REFILL     PUMP REFILL  Medications ordered for procedure: No orders of the defined types were placed in this encounter.  Medications administered: Lexine Baton "Rick" had no medications administered during this visit.  See the medical record for exact dosing, route, and time of administration.  New Prescriptions   No medications on file   Disposition: Discharge home  Discharge Date & Time: 02/27/2018; 1258 hrs.   Physician-requested Follow-up: Return for Pump Refill (as per pump program) (Max:58mo, w/ CDionisio David NP.  Future Appointments  Date Time Provider DGoddard 03/21/2018  8:00 AM [facility] FLUSH [facility] None  03/21/2018  8:30 AM CLequita Asal MD [facility] None  03/21/2018  9:00 AM [facility] [place] INFUSION CHAIR 4 [facility] None  05/02/2018  8:30 AM McGowan, SHunt Oris PA-C [facility] None  06/12/2018 11:15 AM K[facility]evelyn Francois NP [facility]   Primary Care Physician: FSofie Hartigan MD [facility] Note by: FGaspar Cola MD Date: 02/27/2018; Time: 12:50 PM  Disclaimer:  Medicine is not an eChief Strategy Officer The only guarantee in medicine is that nothing is guaranteed. It is important to note that the decision to proceed with this intervention was based on the information collected from the patient. The Data and conclusions were drawn from the patient's questionnaire, the interview, and the physical examination. Because the information was provided in large part by the patient, it cannot be guaranteed that it has not been purposely or unconsciously manipulated. Every effort has been made to obtain as much relevant data as possible for this evaluation. It is  important to note that the conclusions that lead to this procedure are derived in large part from the available data. Always take into account that the treatment will also be dependent on availability of resources and existing treatment guidelines, considered by other Pain Management Practitioners as being common knowledge and practice, at the time of the intervention. For Medico-Legal purposes, it is also important to point out that variation in procedural techniques and pharmacological choices are the acceptable norm. The indications, contraindications, technique, and results of the above procedure should only be interpreted and judged by a Board-Certified Interventional Pain Specialist with extensive familiarity and expertise in the same exact procedure and technique.

## 2018-02-27 ENCOUNTER — Encounter: Payer: Self-pay | Admitting: Pain Medicine

## 2018-02-27 ENCOUNTER — Encounter: Payer: Self-pay | Admitting: Hematology and Oncology

## 2018-02-27 ENCOUNTER — Ambulatory Visit: Payer: Medicare Other | Attending: Pain Medicine | Admitting: Pain Medicine

## 2018-02-27 VITALS — BP 107/78 | HR 95 | Temp 98.2°F | Resp 16 | Ht 67.0 in | Wt 168.0 lb

## 2018-02-27 DIAGNOSIS — X58XXXA Exposure to other specified factors, initial encounter: Secondary | ICD-10-CM | POA: Insufficient documentation

## 2018-02-27 DIAGNOSIS — Z451 Encounter for adjustment and management of infusion pump: Secondary | ICD-10-CM

## 2018-02-27 DIAGNOSIS — G894 Chronic pain syndrome: Secondary | ICD-10-CM | POA: Diagnosis not present

## 2018-02-27 DIAGNOSIS — G8929 Other chronic pain: Secondary | ICD-10-CM

## 2018-02-27 DIAGNOSIS — M545 Low back pain, unspecified: Secondary | ICD-10-CM

## 2018-02-27 DIAGNOSIS — Z95828 Presence of other vascular implants and grafts: Secondary | ICD-10-CM

## 2018-02-27 DIAGNOSIS — G893 Neoplasm related pain (acute) (chronic): Secondary | ICD-10-CM

## 2018-02-27 DIAGNOSIS — S22080A Wedge compression fracture of T11-T12 vertebra, initial encounter for closed fracture: Secondary | ICD-10-CM | POA: Diagnosis not present

## 2018-02-27 NOTE — Patient Instructions (Signed)

## 2018-03-03 MED FILL — Medication: INTRATHECAL | Qty: 1 | Status: AC

## 2018-03-11 ENCOUNTER — Other Ambulatory Visit: Payer: Self-pay | Admitting: *Deleted

## 2018-03-11 NOTE — Telephone Encounter (Signed)
That doesn't sound correct. He will need to call his dentist for requirements. Generally is a 1-2 gram dose of Amoxicillin.

## 2018-03-11 NOTE — Telephone Encounter (Signed)
Patient called and states that he has not received his prescription for Cephalexin yet for a dental cleaning. He does not know how it was prescribed because it has been a year since he took it. Reviewing the chart, I find an old prescription for it   cephALEXin (KEFLEX) 500 MG capsule [[id number]] DISCONTINUED   Order Details  Dose: 500 mg Route: Oral Frequency: 2 times daily  Dispense Quantity: 10 capsule Refills: 0 Fills remaining: --        Sig: Take 1 capsule (500 mg total) by mouth 2 (two) times daily.  Patient not taking: Reported on 05/14/2017     Please escribe it so he can schedule his dental.

## 2018-03-11 NOTE — Telephone Encounter (Signed)
Patient states he will call his dentist and see if he can get rx

## 2018-03-13 ENCOUNTER — Other Ambulatory Visit: Payer: Self-pay | Admitting: Hematology and Oncology

## 2018-03-18 ENCOUNTER — Other Ambulatory Visit: Payer: Self-pay | Admitting: *Deleted

## 2018-03-18 DIAGNOSIS — C9001 Multiple myeloma in remission: Secondary | ICD-10-CM

## 2018-03-18 MED ORDER — ALPRAZOLAM 0.25 MG PO TABS: 0.2500 mg | ORAL_TABLET | Freq: Every day | ORAL | 0 refills | Status: DC

## 2018-03-20 ENCOUNTER — Other Ambulatory Visit: Payer: Self-pay | Admitting: *Deleted

## 2018-03-20 MED ORDER — ONDANSETRON HCL 8 MG PO TABS: 8.0000 mg | ORAL_TABLET | ORAL | 1 refills | Status: DC

## 2018-03-20 NOTE — Progress Notes (Signed)
[facility] day:  03/21/18  Chief Complaint: Logan Perez is a 72 y.o. 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   HPI: The patient was last seen in the medical oncology clinic on 02/21/2018.  At that time, patient was doing well.  He noted that his weight was a "new high".  He denied any bone pain or interval infections.  Chronic loose stools controlled with as needed use of loperamide.  Chronic back pain managed by baclofen pump.  Exam was stable.  WBC 8600 ([place] 6100).  Creatinine 1.53 (CrCl 43.5 mL/min).  He received cycle #15 daratumumab.  In the interim, he has done well.  He notes nothing new.  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  He has intermittent diarrhea.  He needs a dental cleaning.   Past Medical History:  Diagnosis Date  . Anxiety   . Atrial fibrillation ([place])   . BPH (benign prostatic hyperplasia)   . Complication of anesthesia    BAD HEADACHE NIGHT OF FIRST CATARACT  . Compression fracture of lumbar vertebra ([place])   . Difficulty voiding   . Dysrhythmia    A FIB  . Elevated PSA   . GERD (gastroesophageal reflux disease)   . Hearing aid worn    bilateral  . History of kidney stones   . HLD (hyperlipidemia)   . HOH (hard of hearing)   . Hypertension   . Multiple myeloma ([place])   . Neuropathy    feet. 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  . Pain    BACK  . Palpitations   . Pneumonia   . Pulmonary embolism ([place])   . Sepsis ([place])   . Stroke [facility])    TIA, detected on CT scan. pt was unaware    Past Surgical History:  Procedure Laterality Date  . BACK SURGERY  1994  . CATARACT EXTRACTION W/PHACO Right 05/02/2016   Procedure: CATARACT EXTRACTION PHACO AND INTRAOCULAR LENS PLACEMENT (IOC);  Surgeon: Birder Robson, MD;  Location: [facility] ORS;   Service: Ophthalmology;  Laterality: Right;  [place] 1.06 AP% 20.6 CDE 13.70 FLUID PACK LOT # [id number]  . CATARACT EXTRACTION W/PHACO Left 05/16/2016   Procedure: CATARACT EXTRACTION PHACO AND INTRAOCULAR LENS PLACEMENT (IOC);  Surgeon: Birder Robson, MD;  Location: [facility] ORS;  Service: Ophthalmology;  Laterality: Left;  [place] 01:43 AP% 19.8 CDE 20.45 FLUID PACK LOT #[id number] H  . COLONOSCOPY WITH PROPOFOL N/A 03/22/2017   Procedure: COLONOSCOPY WITH PROPOFOL;  Surgeon: Lucilla Lame, MD;  Location: [facility];  Service: Endoscopy;  Laterality: N/A;  has port  . ESOPHAGOGASTRODUODENOSCOPY (EGD) WITH PROPOFOL  03/22/2017   Procedure: ESOPHAGOGASTRODUODENOSCOPY (EGD) WITH PROPOFOL;  Surgeon: Lucilla Lame, MD;  Location: [facility];  Service: Endoscopy;;  . EYE SURGERY    . KNEE ARTHROSCOPY Left 1992  . LIMBAL STEM CELL TRANSPLANT    . PAIN PUMP IMPLANTATION  2012  . PAIN PUMP IMPLANTATION N/A 09/04/2017   Procedure: INTRATHECAL PUMP BATTERY CHANGE;  Surgeon: Milinda Pointer, MD;  Location: [facility];  Service: Neurosurgery;  Laterality: N/A;  . PORTA CATH INSERTION N/A 12/04/2016   Procedure: Glori Luis Cath Insertion;  Surgeon: Algernon Huxley, MD;  Location: [facility];  Service: Cardiovascular;  Laterality: N/A;  . stem cell implant  2008   [facility]  Family History  Problem Relation Age of Onset  . Cancer Father        throat  . Kidney disease Sister   . Stroke Unknown   . Stroke Mother   . Bladder Cancer Neg Hx   . Prostate cancer Neg Hx   . Kidney cancer Neg Hx     Social History:  reports that he quit smoking about 42 years ago. His smoking use included cigarettes. He has a 30.00 pack-year smoking history. He has never used smokeless tobacco. He reports that he does not drink alcohol or use drugs.  He has a wire haired dachshund.  He lives in [place].  His wife's name is [name].  The patient is alone today.  Allergies:  Allergies  Allergen Reactions  . Azithromycin  Diarrhea and Other (See Comments)    Possible cause of C. Diff Possible cause of C. Diff Possible cause of C. Diff Possible cause of C. Diff  . Bee Pollen Other (See Comments)    Sneezing, watery eyes, runny nose  . Pollen Extract Other (See Comments)    Sneezing, watery eyes, runny nose  . 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 Rivaroxaban Rash    Current Medications: Current Outpatient Medications  Medication Sig Dispense Refill  . acetaminophen (TYLENOL) 325 MG tablet Take 650 mg every 6 (six) hours as needed by mouth for mild pain or headache.     . ALPRAZolam (XANAX) 0.25 MG tablet Take 1 tablet (0.25 mg total) by mouth at bedtime. 30 tablet 0  . apixaban (ELIQUIS) 5 MG TABS tablet Take 1 tablet (5 mg total) by mouth 2 (two) times daily. 60 tablet 5  . baclofen (LIORESAL) 10 MG tablet Take 1 tablet (10 mg total) by mouth at bedtime. 30 tablet 5  . bismuth subsalicylate (PEPTO BISMOL) 262 MG/15ML suspension Take 30 mLs by mouth every 6 (six) hours as needed for diarrhea or loose stools.    . Calcium Carb-Cholecalciferol (CALCIUM-VITAMIN D) 500-400 MG-UNIT TABS Take 1 tablet daily by mouth.    . cetirizine (ZYRTEC) 10 MG tablet Take 10 mg by mouth daily as needed for allergies.     . Cholecalciferol (VITAMIN D3) 2000 units capsule Take 2,000 Units daily by mouth.     . Daratumumab (DARZALEX IV) Inject 1,200 mg every 30 (thirty) days into the vein.     [name] dexamethasone (DECADRON) 4 MG tablet Take 4 tabs PO 1 hr prior to infusion, then take 1 tab on day 2 and day 3 30 tablet 1  . diltiazem (CARDIZEM CD) 120 MG 24 hr capsule Take 120 mg daily by mouth.     . diltiazem (CARDIZEM) 60 MG tablet Take 60 mg daily as needed by mouth (for increased heart rate > 140).     [name] diphenhydrAMINE (BENADRYL) 25 MG tablet Take 25 mg by mouth as directed. With chemo treatment    . ENSURE (ENSURE) Take 237 mLs by mouth daily.    . fluticasone  (FLONASE) 50 MCG/ACT nasal spray Place 1 spray into the nose daily as needed for allergies.     . Hypromellose (ARTIFICIAL TEARS OP) Place 1 drop as needed into both eyes (for dry eyes).    [name] loperamide (IMODIUM) 2 MG capsule Take 4 mg as needed by mouth for diarrhea or loose stools.     . lovastatin (MEVACOR) 20 MG tablet Take 20 mg by mouth every evening.     [name]  montelukast (SINGULAIR) 10 MG tablet Take 60ms daily the day before the infusion, the day of, and the 2 days after infusion 30 tablet 0  . Multiple Vitamins-Iron (MULTIVITAMIN/IRON PO) Take 1 tablet daily by mouth.    .Marland Kitchenomeprazole (PRILOSEC) 20 MG capsule Take 20 mg by mouth daily.     . ondansetron (ZOFRAN) 8 MG tablet Take 1 tablet (8 mg total) by mouth as directed. Take with chemo and can take it as needed 20 tablet 1  . PAIN MANAGEMENT IT PUMP REFILL 1 each by Intrathecal route once for 1 dose. Medication: PF Fentanyl 1,500.0 mcg/ml PF Bupivicaine 30.0 mg/ml PF Clonidine 300.0 mcg/ml Total Volume: 40 ml Needed by 03-04-18_0  1 each 0  . potassium chloride SA (K-DUR,KLOR-CON) 20 MEQ tablet TAKE 1 TABLET BY MOUTH DAILY 90 tablet 0  . ranitidine (ZANTAC) 150 MG tablet Take 150 mg by mouth at bedtime.     . tamsulosin (FLOMAX) 0.4 MG CAPS capsule Take 1 capsule (0.4 mg total) by mouth daily. 90 capsule 4  . valACYclovir (VALTREX) 500 MG tablet TAKE 1 TABLET BY MOUTH DAILY 30 tablet 0  . vitamin B-12 (CYANOCOBALAMIN) 1000 MCG tablet Take 1,000 mcg daily by mouth.      No current facility-administered medications for this visit.    Facility-Administered Medications Ordered in Other Visits  Medication Dose Route Frequency Provider Last Rate Last Dose  . heparin lock flush 100 unit/mL  500 Units Intravenous Once CLequita Asal MD        Review of Systems  Constitutional: Positive for weight loss (4 pounds). Negative for diaphoresis, fever and malaise/fatigue.  HENT: Positive for hearing loss (hearing aid). Negative for  congestion, nosebleeds, sinus pain and sore throat.        Needs dental cleaning.  Eyes: Negative.  Negative for blurred vision, double vision, photophobia, pain and discharge.  Respiratory: Negative.  Negative for cough, hemoptysis, sputum production and shortness of breath.   Cardiovascular: Negative.  Negative for chest pain, palpitations, orthopnea, leg swelling and PND.  Gastrointestinal: Positive for diarrhea (chronic - uses PRN loperamide). Negative for abdominal pain, blood in stool, constipation, melena, nausea and vomiting.  Genitourinary: Negative for dysuria, frequency, hematuria and urgency.  Musculoskeletal: Positive for back pain (chronic secondary to known compression fracture. On Baclofen pump). Negative for falls, joint pain and myalgias.  Skin: Negative for itching and rash.  Neurological: Positive for tingling (neuropathy in feet). Negative for dizziness, tremors, weakness and headaches.  Endo/Heme/Allergies: Negative.  Does not bruise/bleed easily.  Psychiatric/Behavioral: Negative for depression, memory loss and suicidal ideas. The patient is not nervous/anxious and does not have insomnia.   All other systems reviewed and are negative.  Performance status (ECOG): 1 - Symptomatic but completely ambulatory  Physical Exam: Blood pressure 124/82, pulse 86, temperature 97.9 F (36.6 C), temperature source Tympanic, resp. rate 18, height 5' 7" (1.702 m), weight 167 lb (75.8 kg). GENERAL:  Well developed, well nourished, gentleman sitting comfortably in the exam room in no acute distress. MENTAL STATUS:  Alert and oriented to person, place and time. HEAD:  Short brown hair.  Graying goatee.  Normocephalic, atraumatic, face symmetric, no Cushingoid features. EYES:  Glasses.  Blue eyes s/p cataract surgery.  Pupils equal round and reactive to light and accomodation.  No conjunctivitis or scleral icterus. ENT:  Hearing aide.  Oropharynx clear without lesion.  Tongue normal. Mucous  membranes moist.  RESPIRATORY:  Clear to auscultation without rales, wheezes or rhonchi. CARDIOVASCULAR:  Regular  rate and rhythm without murmur, rub or gallop. ABDOMEN:  RUQ pain pump.  Soft, non-tender, with active bowel sounds, and no hepatosplenomegaly.  No masses. SKIN:  Small area of transplanted skin on tip of nose s/p Moh's surgery.  Left wrist with pink oval shaped macular lesion.  No rashes, ulcers or lesions. EXTREMITIES: No edema, no skin discoloration or tenderness.  No palpable cords. LYMPH NODES: No palpable cervical, supraclavicular, axillary or inguinal adenopathy  NEUROLOGICAL: Unremarkable. PSYCH:  Appropriate.    Infusion on 03/21/2018  Component Date Value Ref Range Status  . Magnesium 03/21/2018 2.2  1.7 - 2.4 mg/dL Final   Performed at [facility], [address], [place], [place] [address]  . Sodium 03/21/2018 138  135 - 145 mmol/L Final  . Potassium 03/21/2018 4.1  3.5 - 5.1 mmol/L Final  . Chloride 03/21/2018 107  101 - 111 mmol/L Final  . CO2 03/21/2018 21* 22 - 32 mmol/L Final  . Glucose, Bld 03/21/2018 153* 65 - 99 mg/dL Final  . BUN 03/21/2018 16  6 - 20 mg/dL Final  . Creatinine, Ser 03/21/2018 1.54* 0.61 - 1.24 mg/dL Final  . Calcium 03/21/2018 9.1  8.9 - 10.3 mg/dL Final  . Total Protein 03/21/2018 6.2* 6.5 - 8.1 g/dL Final  . Albumin 03/21/2018 4.1  3.5 - 5.0 g/dL Final  . AST 03/21/2018 32  15 - 41 U/L Final  . ALT 03/21/2018 16* 17 - 63 U/L Final  . Alkaline Phosphatase 03/21/2018 43  38 - 126 U/L Final  . Total Bilirubin 03/21/2018 0.6  0.3 - 1.2 mg/dL Final  . GFR calc non Af Amer 03/21/2018 44* >60 mL/min Final  . GFR calc Af Amer 03/21/2018 51* >60 mL/min Final   Comment: (NOTE) The eGFR has been calculated using the CKD EPI equation. This calculation has not been validated in all clinical situations. eGFR's persistently <60 mL/min signify possible Chronic Kidney Disease.   Georgiann Hahn gap 03/21/2018 10  5 - 15 Final   Performed at  [facility], [address] [place], [place] [address]  . WBC 03/21/2018 6.8  3.8 - 10.6 K/uL Final  . RBC 03/21/2018 3.51* 4.40 - 5.90 MIL/uL Final  . Hemoglobin 03/21/2018 13.0  13.0 - 18.0 g/dL Final  . HCT 03/21/2018 36.2* 40.0 - 52.0 % Final  . MCV 03/21/2018 103.1* 80.0 - 100.0 fL Final  . MCH 03/21/2018 37.0* 26.0 - 34.0 pg Final  . MCHC 03/21/2018 35.8  32.0 - 36.0 g/dL Final  . RDW 03/21/2018 14.4  11.5 - 14.5 % Final  . Platelets 03/21/2018 199  150 - 440 K/uL Final  . Neutrophils Relative % 03/21/2018 57  % Final  . Neutro Abs 03/21/2018 3.9  1.4 - 6.5 K/uL Final  . Lymphocytes Relative 03/21/2018 31  % Final  . Lymphs Abs 03/21/2018 2.1  1.0 - 3.6 K/uL Final  . Monocytes Relative 03/21/2018 8  % Final  . Monocytes Absolute 03/21/2018 0.6  0.2 - 1.0 K/uL Final  . Eosinophils Relative 03/21/2018 3  % Final  . Eosinophils Absolute 03/21/2018 0.2  0 - 0.7 K/uL Final  . Basophils Relative 03/21/2018 1  % Final  . Basophils Absolute 03/21/2018 0.0  0 - 0.1 K/uL Final   Performed at [facility], [address], [place], [place] [address]    Assessment:  Senica Crall is a 72 y.o. male with stage III mutiple myeloma.  He initially presented with progressive back pain beginning  in 12/2006.  MRI revealed "spots and compression fractures".  He began Velcade, thalidomide, and Decadron.  In 08/2007, he underwent high dose chemotherapy and autologous stem cell transplant.  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  He recurred with a rising M-spike (2.7) with repeat M spike (1.7 gm/dl) in 03/2010.  He was initially treated with Velcade (02/08/2010 - 05/10/2010).  He then began Revlimid (15 mg 3 weeks on/1 week off) and Decadron (40 mg on day 1, 8, 15, 22).  Because of significant side effect with Decadron his dose was decreased to 10 mg once a week in 07/2010.   He was on maintenance Revlimid. Revlimid was initially10 mg 3 weeks on/1 week off.  This was changed to 10 mg 2 weeks on/2 weeks off secondary to right nipple tenderness. His dose was increased to 10 mg 3 weeks on/1 week off with Decadron 10 mg a week (on Sundays) and then Revlamid 15 mg 3 weeks on and 1 week off with Decadron on Sundays.  He began Pomalyst 4 mg 3 weeks on/1 week off with Decadron on 08/27/2015.  Over the past year his SPEP has revealed no monoclonal protein (04/21/2015) and 0.5 gm/dL on 09/22/2015 and 10/20/2015. M spike was 0.1 on 02/02/2016, 03/01/2016, 03/29/2016, 04/26/2016, and 05/24/2016.  M spike was 0 on 10/11/2016, 11/28/2016, 02/05/2017, 03/21/2017, 08/02/2017, 10/04/2017, 12/27/2017, 01/24/2018, and 02/21/2018.  M spike was 0.1 on 11/01/2017 and 0.1 on 11/29/2017.   Free light chains have been monitored. Kappa free light chains were 18.54 on 11/21/2013, 18.37 on 02/20/2014, 18.93 on 05/22/2014, 32.58 (high; normal ratio 1.27) on 08/21/2014, 51.53 (high; elevated ratio of 2.12) on 11/13/2014, 28.08 (ratio 1.73) on 12/09/2014, 23.71 (ratio 2.17) on 01/18/2015, 92.93 (ratio 9.49) on 04/21/2015, 93.44 (ratio 10.28) on 05/26/2015, 255.45 (ratio of 24.05) on 07/14/2015, 373.89 (ratio 48.31) on 08/04/2015, 474.33 (ratio 70.58) on 08/25/2015, 450.76 (ratio 55.44) on 09/22/2015, 453.4 (ratio 59.89) on 10/20/2015, 58.26 (ratio of > 40.74) on 02/02/2016, 62.67 (ratio of 37.98) on 03/01/2016, 75.7 (ratio > 50.47) on 03/29/2016, 79.7 (ratio 53.13) on 04/26/2016, 108.6 (ratio > 72.4) on 05/24/2016, 8.3 (1.46 ratio) on 10/11/2016, 6.7 (0.81 ratio) on 02/05/2017, 7.8 (1.01 ratio) on 03/21/2017,7.6 (ratio 0.63) on 06/01/2017, and 8.1 (ratio 1.13) on 11/29/2017.  Bone survey on 12/08/2014 was stable.  Bone survey on 10/21/2015 revealed increase conspicuity of subcentimeter lytic lesions in the calvarium.  Bone marrow aspirate and biopsy on 11/04/2015 revealed an atypical monoclonal plasma cells estimated at 30-40% of marrow cells.   Marrow was variably cellular (approximately  45%) with background trilineage hematopoiesis. There was no significant increase in marrow reticulin fibers. Storage iron was present.    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 He develoed herpes zoster in 04/2008. He developed a pulmonary embolism in 05/2013. He was initially on Xarelto, but is now on Eliquis. He had an episode of pneumonia around this time requiring a brief admission. He developed severe lower leg cramps on 08/07/2014 secondary to hypokalemia. Duplex was negative.   He was treated for C difficile colitis (Flagyl completed 07/30/2015).  He has a chronic indwelling pain pump.  He received 4 cycles of Pomalyst and Decadron (08/27/2015 - 11/19/2015).  Restaging studies document progressive disease.  Kappa free light chains are increasing.  SPEP revealed 0.5 gm/dL monoclonal protein then 1.3 gm/dL.  Bone survey reveals increase conspicuity of subcentimeter lytic lesions in the calvarium.  Bone marrow reveals 30-40% plasma cells.  MUGA on 11/30/2015 revealed an ejection fraction of 46%.  He is felt not to be a good candidate for Kyprolis.  There were no focal wall motion abnormalities.  He had a stress echo less than 1 year ago.  He has a history of PVCs and atrial fibrillation.  He takes Cardizem prn.  He received 17 weeks of daratumumab (Darzalex) (12/09/2015 - 05/25/2016).  He tolerated treatment well without side effect.  Bone marrow on 02/09/2016 revealed no diagnostic morphologic evidence of plasma cell myeloma.  Marrow was normocellular to hypocellular marrow for age (ranging from 10-40%) with maturing trilineage hematopoiesis and mild multilineage dyspoiesis.  There was patchy mild increase in reticulin.  Storage iron was present.  Flow cytometry revealed no definitive evidence of monoclonality.  There was a non-specific atypical myeloid and monocytic findings with no increase in blasts.  Cytogenetics were normal (46,  XY).  He is currently 21 months s/p 2nd autologous stem cell transplant on 06/16/2016.  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  He also experienced atrial fibrillation with intermittent episodes of RVR requiring IV beta blockers.  He is on prophylactic valacyclovir for 1 year post transplant.  He started vaccinations (DTaP-Pediatric triple vaccine, Hep B- Pediatrix triple vaccine, Haemophilus influenza B (Hib), inactivated polio virus (IPV), pneumococcal conjugate vaccine 13-valent (PCV 13)) on 04/17/2017.  Recommendation was for Shingrix vaccine to be given in [place] and second dose of Shingrix in 2 months.  He had follow-up vaccinations on 08/28/2017.  He had his second shingles vaccine. He received 18 month vaccinations - DTaP-Pediatric triple vaccine, Hep B- Pediatrix triple vaccine, Haemophilus influenza B (Hib), inactivated polio virus (IPV), pneumococcal conjugate vaccine 13-valent (PCV 13) on 02/05/2018.  He is s/p 15 cycles of Daratumumab (Darzalex) post transplant (12/05/2016 - 02/21/2018).  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  Work-up on 08/23/2016 revealed the following normal studies:  B12 and folate.  Ferritin was 1379 (high).  Iron studies included a saturation of 56% and TIBC 141 (low).  ESR was 64.  Thrombocytopenia has resolved.  He has a history of hypomagnesemia and receives IV magnesium (2-4 gm) weekly.  He has not required magnesium since 10/24/2016.  Symptomatically, he is doing well.  He has intermittent diarrhea well managed with loperamide.  Neuropathy is stable.  Exam is stable.  WBC is 6800 with an [place] of 3900.  Hemoglobin is 13.0, hematocrit 36.2, platelets 199,000.  Creatinine is 1.54 (CrCl 43.5 mL/min).   Plan:  1.  Labs today:  CBC with diff, CMP, Mg, SPEP. 2.  Patient to take own premeds from home:  Ondansetron (8 mg), Tylenol (650 mg), and Benadryl (50 mg). 3.  Labs reviewed.  Blood counts stable and adequate enough for treatment. Will proceed with cycle #16 daratumumab today. 4.  Continue Eliquis 5 mg BID as previously scheduled.  5.  Discuss immunizations given with last visit at [facility].  He will require immunizations (pneumococcal PPV23 or Pneumovax, and MMR) at 24 months. [facility] at [facility]. 6.  Discuss dental cleaning.  Discuss prophylactic antibiotics to protect port. 8.  RTC in 4 weeks for MD assessment, labs (CBC with diff, CMP, Mg, SPEP), and cycle #17 daratumumab.   Honor Loh, NP 03/21/18, 8:53 AM   I saw and evaluated the patient, participating in the key portions of the service and reviewing pertinent diagnostic studies and records.  I reviewed the nurse practitioner's note and agree with the findings  and the plan.  The assessment and plan were discussed with the patient. Several questions were asked by the patient and answered.   Nolon Stalls, MD 03/21/2018,8:53 AM

## 2018-03-21 ENCOUNTER — Encounter: Payer: Self-pay | Admitting: Hematology and Oncology

## 2018-03-21 ENCOUNTER — Inpatient Hospital Stay: Payer: Medicare Other

## 2018-03-21 ENCOUNTER — Inpatient Hospital Stay (HOSPITAL_BASED_OUTPATIENT_CLINIC_OR_DEPARTMENT_OTHER): Payer: Medicare Other | Admitting: Hematology and Oncology

## 2018-03-21 ENCOUNTER — Inpatient Hospital Stay: Payer: Medicare Other | Attending: Hematology and Oncology

## 2018-03-21 VITALS — BP 124/82 | HR 86 | Temp 97.9°F | Resp 18 | Ht 67.0 in | Wt 167.0 lb

## 2018-03-21 VITALS — BP 138/85 | HR 78 | Temp 95.0°F | Resp 18

## 2018-03-21 DIAGNOSIS — I4891 Unspecified atrial fibrillation: Secondary | ICD-10-CM

## 2018-03-21 DIAGNOSIS — Z7901 Long term (current) use of anticoagulants: Secondary | ICD-10-CM | POA: Insufficient documentation

## 2018-03-21 DIAGNOSIS — Z5112 Encounter for antineoplastic immunotherapy: Secondary | ICD-10-CM | POA: Diagnosis not present

## 2018-03-21 DIAGNOSIS — Z86711 Personal history of pulmonary embolism: Secondary | ICD-10-CM | POA: Insufficient documentation

## 2018-03-21 DIAGNOSIS — C9001 Multiple myeloma in remission: Secondary | ICD-10-CM | POA: Diagnosis present

## 2018-03-21 DIAGNOSIS — Z9221 Personal history of antineoplastic chemotherapy: Secondary | ICD-10-CM | POA: Insufficient documentation

## 2018-03-21 DIAGNOSIS — G629 Polyneuropathy, unspecified: Secondary | ICD-10-CM

## 2018-03-21 DIAGNOSIS — Z9484 Stem cells transplant status: Secondary | ICD-10-CM

## 2018-03-21 DIAGNOSIS — C9002 Multiple myeloma in relapse: Secondary | ICD-10-CM

## 2018-03-21 LAB — CBC WITH DIFFERENTIAL/PLATELET
Basophils Absolute: 0 10*3/uL (ref 0–0.1)
Basophils Relative: 1 %
Eosinophils Absolute: 0.2 10*3/uL (ref 0–0.7)
Eosinophils Relative: 3 %
HCT: 36.2 % — ABNORMAL LOW (ref 40.0–52.0)
Hemoglobin: 13 g/dL (ref 13.0–18.0)
Lymphocytes Relative: 31 %
Lymphs Abs: 2.1 10*3/uL (ref 1.0–3.6)
MCH: 37 pg — ABNORMAL HIGH (ref 26.0–34.0)
MCHC: 35.8 g/dL (ref 32.0–36.0)
MCV: 103.1 fL — ABNORMAL HIGH (ref 80.0–100.0)
Monocytes Absolute: 0.6 10*3/uL (ref 0.2–1.0)
Monocytes Relative: 8 %
Neutro Abs: 3.9 10*3/uL (ref 1.4–6.5)
Neutrophils Relative %: 57 %
Platelets: 199 10*3/uL (ref 150–440)
RBC: 3.51 MIL/uL — ABNORMAL LOW (ref 4.40–5.90)
RDW: 14.4 % (ref 11.5–14.5)
WBC: 6.8 10*3/uL (ref 3.8–10.6)

## 2018-03-21 LAB — COMPREHENSIVE METABOLIC PANEL
ALT: 16 U/L — ABNORMAL LOW (ref 17–63)
AST: 32 U/L (ref 15–41)
Albumin: 4.1 g/dL (ref 3.5–5.0)
Alkaline Phosphatase: 43 U/L (ref 38–126)
Anion gap: 10 (ref 5–15)
BUN: 16 mg/dL (ref 6–20)
CO2: 21 mmol/L — ABNORMAL LOW (ref 22–32)
Calcium: 9.1 mg/dL (ref 8.9–10.3)
Chloride: 107 mmol/L (ref 101–111)
Creatinine, Ser: 1.54 mg/dL — ABNORMAL HIGH (ref 0.61–1.24)
GFR calc Af Amer: 51 mL/min — ABNORMAL LOW (ref >60)
GFR calc non Af Amer: 44 mL/min — ABNORMAL LOW (ref >60)
Glucose, Bld: 153 mg/dL — ABNORMAL HIGH (ref 65–99)
Potassium: 4.1 mmol/L (ref 3.5–5.1)
Sodium: 138 mmol/L (ref 135–145)
Total Bilirubin: 0.6 mg/dL (ref 0.3–1.2)
Total Protein: 6.2 g/dL — ABNORMAL LOW (ref 6.5–8.1)

## 2018-03-21 LAB — MAGNESIUM: Magnesium: 2.2 mg/dL (ref 1.7–2.4)

## 2018-03-21 MED ORDER — SODIUM CHLORIDE 0.9% FLUSH
10.0000 mL | Freq: Once | INTRAVENOUS | Status: AC
  Administered 2018-03-21: 10 mL via INTRAVENOUS
  Filled 2018-03-21: qty 10

## 2018-03-21 MED ORDER — SODIUM CHLORIDE 0.9 % IV SOLN: 16.0000 mg/kg | Freq: Once | INTRAVENOUS | Status: DC

## 2018-03-21 MED ORDER — SODIUM CHLORIDE 0.9% FLUSH
10.0000 mL | INTRAVENOUS | Status: DC | PRN
  Filled 2018-03-21: qty 10

## 2018-03-21 MED ORDER — HEPARIN SOD (PORK) LOCK FLUSH 100 UNIT/ML IV SOLN
500.0000 [IU] | Freq: Once | INTRAVENOUS | Status: AC
  Administered 2018-03-21: 500 [IU] via INTRAVENOUS

## 2018-03-21 MED ORDER — HEPARIN SOD (PORK) LOCK FLUSH 100 UNIT/ML IV SOLN
500.0000 [IU] | Freq: Once | INTRAVENOUS | Status: DC | PRN
  Filled 2018-03-21: qty 5

## 2018-03-21 MED ORDER — SODIUM CHLORIDE 0.9 % IV SOLN
Freq: Once | INTRAVENOUS | Status: AC
  Administered 2018-03-21: 09:00:00 via INTRAVENOUS
  Filled 2018-03-21: qty 1000

## 2018-03-21 MED ORDER — SODIUM CHLORIDE 0.9 % IV SOLN
1200.0000 mg | Freq: Once | INTRAVENOUS | Status: AC
  Administered 2018-03-21: 1200 mg via INTRAVENOUS
  Filled 2018-03-21: qty 60

## 2018-03-24 LAB — PROTEIN ELECTROPHORESIS, SERUM
A/G Ratio: 1.8 — ABNORMAL HIGH (ref 0.7–1.7)
Albumin ELP: 3.7 g/dL (ref 2.9–4.4)
Alpha-1-Globulin: 0.1 g/dL (ref 0.0–0.4)
Alpha-2-Globulin: 0.9 g/dL (ref 0.4–1.0)
Beta Globulin: 0.9 g/dL (ref 0.7–1.3)
Gamma Globulin: 0.3 g/dL — ABNORMAL LOW (ref 0.4–1.8)
Globulin, Total: 2.1 g/dL — ABNORMAL LOW (ref 2.2–3.9)
M-Spike, %: 0.1 g/dL — ABNORMAL HIGH
Total Protein ELP: 5.8 g/dL — ABNORMAL LOW (ref 6.0–8.5)

## 2018-04-13 ENCOUNTER — Other Ambulatory Visit: Payer: Self-pay | Admitting: Hematology and Oncology

## 2018-04-14 ENCOUNTER — Other Ambulatory Visit: Payer: Self-pay | Admitting: Hematology and Oncology

## 2018-04-14 DIAGNOSIS — C9001 Multiple myeloma in remission: Secondary | ICD-10-CM

## 2018-04-16 ENCOUNTER — Other Ambulatory Visit: Payer: Self-pay | Admitting: Hematology and Oncology

## 2018-04-17 NOTE — Progress Notes (Signed)
[facility] day:  04/17/18  Chief Complaint: Logan Perez is a 72 y.o. 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   HPI: The patient was last seen in the medical oncology clinic on 03/21/2018.  At that time, patient was doing well.  He denied any acute complaints.  He noted stable neuropathy in his fingers and toes.  Chronic loose stools persisted, for which he uses PRN loperamide. Exam was stable. WBC 6800 ([place] 3900).  Creatinine elevated at 1.54 (CrCl 43.5 mL/min).  He received cycle #16 daratumumab.  In the interim, patient has been doing well overall. He denies any negative side effects associated with his treatments. He specifically denies nausea and vomiting. Patient notes increased nocturnal diaphoresis over the course of the last 2 weeks. Bowels have been "about the same". He continues to use loperamide on a PRN basis. Patient denies that he has experienced any B symptoms. He denies any interval infections. Stable neuropathy in his fingers and toes remains unchanged.   Patient advises that he maintains an adequate appetite. He is eating well. Weight today is 172 lb 1 oz (78 kg), which compared to his last visit to the clinic, represents a 5 pound increase.  Patient denies pain in the clinic today. Pain has been well controlled by pain management. He continues to utilize his Baclofen pump.     Past Medical History:  Diagnosis Date  . Anxiety   . Atrial fibrillation ([place])   . BPH (benign prostatic hyperplasia)   . Complication of anesthesia    BAD HEADACHE NIGHT OF FIRST CATARACT  . Compression fracture of lumbar vertebra ([place])   . Difficulty voiding   . Dysrhythmia    A FIB  . Elevated PSA   . GERD (gastroesophageal reflux disease)   . Hearing aid worn    bilateral  .  History of kidney stones   . HLD (hyperlipidemia)   . HOH (hard of hearing)   . Hypertension   . Multiple myeloma ([place])   . Neuropathy    feet. 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  . Pain    BACK  . Palpitations   . Pneumonia   . Pulmonary embolism ([place])   . Sepsis ([place])   . Stroke [facility])    TIA, detected on CT scan. pt was unaware    Past Surgical History:  Procedure Laterality Date  . BACK SURGERY  1994  . CATARACT EXTRACTION W/PHACO Right 05/02/2016   Procedure: CATARACT EXTRACTION PHACO AND INTRAOCULAR LENS PLACEMENT (IOC);  Surgeon: Birder Robson, MD;  Location: [facility];  Service: Ophthalmology;  Laterality: Right;  [place] 1.06 AP% 20.6 CDE 13.70 FLUID PACK LOT # [id number]  . CATARACT EXTRACTION W/PHACO Left 05/16/2016   Procedure: CATARACT EXTRACTION PHACO AND INTRAOCULAR LENS PLACEMENT (IOC);  Surgeon: Birder Robson, MD;  Location: [facility];  Service: Ophthalmology;  Laterality: Left;  [place] 01:43 AP% 19.8 CDE 20.45 FLUID PACK LOT #[id number] H  . COLONOSCOPY WITH PROPOFOL N/A 03/22/2017   Procedure: COLONOSCOPY WITH PROPOFOL;  Surgeon: Lucilla Lame, MD;  Location: [place];  Service: Endoscopy;  Laterality: N/A;  has port  . ESOPHAGOGASTRODUODENOSCOPY (EGD) WITH PROPOFOL  03/22/2017   Procedure: ESOPHAGOGASTRODUODENOSCOPY (EGD) WITH PROPOFOL;  Surgeon: Lucilla Lame, MD;  Location: [place];  Service: Endoscopy;;  . EYE SURGERY    .  KNEE ARTHROSCOPY Left 1992  . LIMBAL STEM CELL TRANSPLANT    . PAIN PUMP IMPLANTATION  2012  . PAIN PUMP IMPLANTATION N/A 09/04/2017   Procedure: INTRATHECAL PUMP BATTERY CHANGE;  Surgeon: Milinda Pointer, MD;  Location: [facility];  Service: Neurosurgery;  Laterality: N/A;  . PORTA CATH INSERTION N/A 12/04/2016   Procedure: Glori Luis Cath Insertion;  Surgeon: Algernon Huxley, MD;  Location: [facility];  Service: Cardiovascular;  Laterality: N/A;  . stem cell implant  2008   [facility]    Family History  Problem Relation Age of Onset  . Cancer  Father        throat  . Kidney disease Sister   . Stroke Unknown   . Stroke Mother   . Bladder Cancer Neg Hx   . Prostate cancer Neg Hx   . Kidney cancer Neg Hx     Social History:  reports that he quit smoking about 42 years ago. His smoking use included cigarettes. He has a 30.00 pack-year smoking history. He has never used smokeless tobacco. He reports that he does not drink alcohol or use drugs.  He has a wire haired dachshund.  He lives in [place].  His wife's name is [name].  The patient is alone today.  Allergies:  Allergies  Allergen Reactions  . Azithromycin Diarrhea and Other (See Comments)    Possible cause of C. Diff Possible cause of C. Diff Possible cause of C. Diff Possible cause of C. Diff  . Bee Pollen Other (See Comments)    Sneezing, watery eyes, runny nose  . Pollen Extract Other (See Comments)    Sneezing, watery eyes, runny nose  . 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 Rivaroxaban Rash    Current Medications: Current Outpatient Medications  Medication Sig Dispense Refill  . acetaminophen (TYLENOL) 325 MG tablet Take 650 mg every 6 (six) hours as needed by mouth for mild pain or headache.     . ALPRAZolam (XANAX) 0.25 MG tablet TAKE 1 TABLET(0.25 MG) BY MOUTH AT BEDTIME 30 tablet 0  . apixaban (ELIQUIS) 5 MG TABS tablet Take 1 tablet (5 mg total) by mouth 2 (two) times daily. 60 tablet 5  . baclofen (LIORESAL) 10 MG tablet Take 1 tablet (10 mg total) by mouth at bedtime. 30 tablet 5  . bismuth subsalicylate (PEPTO BISMOL) 262 MG/15ML suspension Take 30 mLs by mouth every 6 (six) hours as needed for diarrhea or loose stools.    . Calcium Carb-Cholecalciferol (CALCIUM-VITAMIN D) 500-400 MG-UNIT TABS Take 1 tablet daily by mouth.    . cetirizine (ZYRTEC) 10 MG tablet Take 10 mg by mouth daily as needed for allergies.     . Cholecalciferol (VITAMIN D3) 2000 units capsule Take 2,000 Units daily by  mouth.     . Daratumumab (DARZALEX IV) Inject 1,200 mg every 30 (thirty) days into the vein.     [name] dexamethasone (DECADRON) 4 MG tablet Take 4 tabs PO 1 hr prior to infusion, then take 1 tab on day 2 and day 3 30 tablet 1  . diltiazem (CARDIZEM CD) 120 MG 24 hr capsule Take 120 mg daily by mouth.     . diltiazem (CARDIZEM) 60 MG tablet Take 60 mg daily as needed by mouth (for increased heart rate > 140).     [name] diphenhydrAMINE (BENADRYL) 25 MG tablet Take 25 mg by mouth as directed. With chemo treatment    .  ENSURE (ENSURE) Take 237 mLs by mouth daily.    . fluticasone (FLONASE) 50 MCG/ACT nasal spray Place 1 spray into the nose daily as needed for allergies.     . Hypromellose (ARTIFICIAL TEARS OP) Place 1 drop as needed into both eyes (for dry eyes).    [name] loperamide (IMODIUM) 2 MG capsule Take 4 mg as needed by mouth for diarrhea or loose stools.     . lovastatin (MEVACOR) 20 MG tablet Take 20 mg by mouth every evening.     . montelukast (SINGULAIR) 10 MG tablet Take 69ms daily the day before the infusion, the day of, and the 2 days after infusion 30 tablet 0  . Multiple Vitamins-Iron (MULTIVITAMIN/IRON PO) Take 1 tablet daily by mouth.    .Marland Kitchenomeprazole (PRILOSEC) 20 MG capsule Take 20 mg by mouth daily.     . ondansetron (ZOFRAN) 8 MG tablet Take 1 tablet (8 mg total) by mouth as directed. Take with chemo and can take it as needed 20 tablet 1  . PAIN MANAGEMENT IT PUMP REFILL 1 each by Intrathecal route once for 1 dose. Medication: PF Fentanyl 1,500.0 mcg/ml PF Bupivicaine 30.0 mg/ml PF Clonidine 300.0 mcg/ml Total Volume: 40 ml Needed by 03-04-18'@1100'  1 each 0  . potassium chloride SA (K-DUR,KLOR-CON) 20 MEQ tablet TAKE 1 TABLET BY MOUTH DAILY 90 tablet 0  . ranitidine (ZANTAC) 150 MG tablet Take 150 mg by mouth at bedtime.     . tamsulosin (FLOMAX) 0.4 MG CAPS capsule Take 1 capsule (0.4 mg total) by mouth daily. 90 capsule 4  . valACYclovir (VALTREX) 500 MG tablet TAKE 1 TABLET BY MOUTH  DAILY 30 tablet 0  . vitamin B-12 (CYANOCOBALAMIN) 1000 MCG tablet Take 1,000 mcg daily by mouth.      No current facility-administered medications for this visit.     Review of Systems  Constitutional: Positive for diaphoresis (increased at night x 2 weeks). Negative for fever, malaise/fatigue and weight loss (weight up 5 pounds).       "I feel well".  HENT: Positive for hearing loss (hearing aids). Negative for nosebleeds and sore throat.   Eyes: Negative.   Respiratory: Negative for cough, hemoptysis, sputum production and shortness of breath.   Cardiovascular: Negative for chest pain, palpitations, orthopnea, leg swelling and PND.  Gastrointestinal: Positive for diarrhea (chronic - uses loperamide PRN). Negative for abdominal pain, blood in stool, constipation, melena, nausea and vomiting.  Genitourinary: Negative for dysuria, frequency, hematuria and urgency.  Musculoskeletal: Positive for back pain (chronic secondary known compression fracture.  Has baclofen pump). Negative for falls, joint pain and myalgias.  Skin: Negative for itching and rash.  Neurological: Positive for tingling (stable neuropathy in fingers and toes). Negative for dizziness, tremors, weakness and headaches.  Endo/Heme/Allergies: Does not bruise/bleed easily.  Psychiatric/Behavioral: Negative for depression, memory loss and suicidal ideas. The patient is not nervous/anxious and does not have insomnia.   All other systems reviewed and are negative.  Performance status (ECOG): 1 - Symptomatic but completely ambulatory   Vital signs BP 132/89 (BP Location: Left Arm, Patient Position: Sitting)   Pulse 96   Temp (!) 97.2 F (36.2 C) (Tympanic)   Resp 18   Wt 172 lb 1 oz (78 kg)   BMI 26.95 kg/m   Physical Exam  Constitutional: He is oriented to person, place, and time and well-developed, well-nourished, and in no distress.  HENT:  Head: Normocephalic and atraumatic.  Short brown hair. Hearing aids.  Eyes:  Pupils  are equal, round, and reactive to light. EOM are normal. No scleral icterus.  Glasses. Blue eyes. Previous cataract surgery.  Neck: Normal range of motion. Neck supple. No tracheal deviation present. No thyromegaly present.  Cardiovascular: Normal rate, regular rhythm and normal heart sounds. Exam reveals no gallop and no friction rub.  No murmur heard. Pulmonary/Chest: Effort normal and breath sounds normal. No respiratory distress. He has no wheezes. He has no rales.  Abdominal: Soft. Bowel sounds are normal. He exhibits no distension. There is no tenderness.  Implanted pain pump to RUQ  Musculoskeletal: Normal range of motion. He exhibits no edema or tenderness.  Lymphadenopathy:    He has no cervical adenopathy.    He has no axillary adenopathy.       Right: No inguinal and no supraclavicular adenopathy present.       Left: No inguinal and no supraclavicular adenopathy present.  Neurological: He is alert and oriented to person, place, and time.  Skin: Skin is warm and dry. No rash noted. No erythema.  Small area of transplanted skin on tip of nose s/p Moh's surgery.  Left wrist with pink oval shaped macular lesion.  Psychiatric: Mood, affect and judgment normal.  Nursing note and vitals reviewed.    Infusion on 04/18/2018  Component Date Value Ref Range Status  . Total Protein ELP 04/18/2018 5.8* 6.0 - 8.5 g/dL Final  . Albumin ELP 04/18/2018 3.5  2.9 - 4.4 g/dL Final  . Alpha-1-Globulin 04/18/2018 0.1  0.0 - 0.4 g/dL Final  . Alpha-2-Globulin 04/18/2018 0.7  0.4 - 1.0 g/dL Final  . Beta Globulin 04/18/2018 0.9  0.7 - 1.3 g/dL Final  . Gamma Globulin 04/18/2018 0.6  0.4 - 1.8 g/dL Final  . M-Spike, % 04/18/2018 0.3* Not Observed g/dL Final  . SPE Interp. 04/18/2018 Comment   Final   Comment: (NOTE) The SPE pattern demonstrates a single peak (M-spike) in the gamma region which may represent monoclonal protein. This peak may also be caused by circulating immune complexes,  cryoglobulins, C-reactive protein, fibrinogen or hemolysis.  If clinically indicated, the presence of a monoclonal gammopathy may be confirmed by immuno- fixation, as well as an evaluation of the urine for the presence of Bence-Jones protein. Performed At: [facility] [place], [place] [id number] Rush Farmer MD ZS:[id number]   . Comment 04/18/2018 Comment   Final   Comment: (NOTE) Protein electrophoresis scan will follow via computer, mail, or courier delivery.   [name] GLOBULIN, TOTAL 04/18/2018 2.3  2.2 - 3.9 g/dL Corrected  . A/G Ratio 04/18/2018 1.5  0.7 - 1.7 Corrected  . Magnesium 04/18/2018 1.9  1.7 - 2.4 mg/dL Final   Performed at [facility], [address], [place], [place] [address]  . Sodium 04/18/2018 138  135 - 145 mmol/L Final  . Potassium 04/18/2018 4.2  3.5 - 5.1 mmol/L Final  . Chloride 04/18/2018 107  98 - 111 mmol/L Final   Please note change in reference range.  . CO2 04/18/2018 23  22 - 32 mmol/L Final  . Glucose, Bld 04/18/2018 161* 70 - 99 mg/dL Final   Please note change in reference range.  . BUN 04/18/2018 20  8 - 23 mg/dL Final   Please note change in reference range.  . Creatinine, Ser 04/18/2018 1.35* 0.61 - 1.24 mg/dL Final  . Calcium 04/18/2018 8.9  8.9 - 10.3 mg/dL Final  . Total Protein 04/18/2018 6.1* 6.5 - 8.1 g/dL Final  . Albumin 04/18/2018 4.0  3.5 - 5.0 g/dL Final  . AST 04/18/2018 21  15 - 41 U/L Final  . ALT 04/18/2018 14  0 - 44 U/L Final   Please note change in reference range.  . Alkaline Phosphatase 04/18/2018 41  38 - 126 U/L Final  . Total Bilirubin 04/18/2018 0.6  0.3 - 1.2 mg/dL Final  . GFR calc non Af Amer 04/18/2018 51* >60 mL/min Final  . GFR calc Af Amer 04/18/2018 59* >60 mL/min Final   Comment: (NOTE) The eGFR has been calculated using the CKD EPI equation. This calculation has not been validated in all clinical situations. eGFR's persistently <60 mL/min signify possible Chronic  Kidney Disease.   Georgiann Hahn gap 04/18/2018 8  5 - 15 Final   Performed at [facility], [address] [place], [place] [address]  . WBC 04/18/2018 8.6  3.8 - 10.6 K/uL Final  . RBC 04/18/2018 3.39* 4.40 - 5.90 MIL/uL Final  . Hemoglobin 04/18/2018 12.5* 13.0 - 18.0 g/dL Final  . HCT 04/18/2018 34.8* 40.0 - 52.0 % Final  . MCV 04/18/2018 102.7* 80.0 - 100.0 fL Final  . MCH 04/18/2018 36.9* 26.0 - 34.0 pg Final  . MCHC 04/18/2018 36.0  32.0 - 36.0 g/dL Final  . RDW 04/18/2018 14.4  11.5 - 14.5 % Final  . Platelets 04/18/2018 191  150 - 440 K/uL Final  . Neutrophils Relative % 04/18/2018 76  % Final  . Neutro Abs 04/18/2018 6.5  1.4 - 6.5 K/uL Final  . Lymphocytes Relative 04/18/2018 16  % Final  . Lymphs Abs 04/18/2018 1.4  1.0 - 3.6 K/uL Final  . Monocytes Relative 04/18/2018 6  % Final  . Monocytes Absolute 04/18/2018 0.5  0.2 - 1.0 K/uL Final  . Eosinophils Relative 04/18/2018 2  % Final  . Eosinophils Absolute 04/18/2018 0.1  0 - 0.7 K/uL Final  . Basophils Relative 04/18/2018 0  % Final  . Basophils Absolute 04/18/2018 0.0  0 - 0.1 K/uL Final   Performed at [facility], [address] [place], [place] [address]    Assessment:  Logan Perez is a 72 y.o. male with stage III mutiple myeloma.  He initially presented with progressive back pain beginning in 12/2006.  MRI revealed "spots and compression fractures".  He began Velcade, thalidomide, and Decadron.  In 08/2007, he underwent high dose chemotherapy and autologous stem cell transplant.  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  He recurred with a rising M-spike (2.7) with repeat M spike (1.7 gm/dl) in 03/2010.  He was initially treated with Velcade (02/08/2010 - 05/10/2010).  He then began Revlimid (15 mg 3 weeks on/1 week off) and Decadron (40 mg on day 1, 8, 15, 22).  Because of significant side effect with Decadron his dose was decreased to 10 mg once a week in 07/2010.    He was  on maintenance Revlimid. Revlimid was initially10 mg 3 weeks on/1 week off. This was changed to 10 mg 2 weeks on/2 weeks off secondary to right nipple tenderness. His dose was increased to 10 mg 3 weeks on/1 week off with Decadron 10 mg a week (on Sundays) and then Revlamid 15 mg 3 weeks on and 1 week off with Decadron on Sundays.  He began Pomalyst 4 mg 3 weeks on/1 week off with Decadron on 08/27/2015.  Over the past year his SPEP has revealed no monoclonal protein (04/21/2015) and 0.5 gm/dL on 09/22/2015 and 10/20/2015. M spike was 0.1 on  02/02/2016, 03/01/2016, 03/29/2016, 04/26/2016, and 05/24/2016.  M spike was 0 on 10/11/2016, 11/28/2016, 02/05/2017, 03/21/2017, 08/02/2017, 10/04/2017, 12/27/2017, 01/24/2018, and 02/21/2018.  M spike was 0.1 on 11/01/2017, 0.1 on 11/29/2017, 0 on 12/27/2017, 0 on 01/24/2018, 0 on 02/21/2018, 0.1 on 03/21/2018, and 0.3 on 04/18/2018.  Free light chains have been monitored. Kappa free light chains were 18.54 on 11/21/2013, 18.37 on 02/20/2014, 18.93 on 05/22/2014, 32.58 (high; normal ratio 1.27) on 08/21/2014, 51.53 (high; elevated ratio of 2.12) on 11/13/2014, 28.08 (ratio 1.73) on 12/09/2014, 23.71 (ratio 2.17) on 01/18/2015, 92.93 (ratio 9.49) on 04/21/2015, 93.44 (ratio 10.28) on 05/26/2015, 255.45 (ratio of 24.05) on 07/14/2015, 373.89 (ratio 48.31) on 08/04/2015, 474.33 (ratio 70.58) on 08/25/2015, 450.76 (ratio 55.44) on 09/22/2015, 453.4 (ratio 59.89) on 10/20/2015, 58.26 (ratio of > 40.74) on 02/02/2016, 62.67 (ratio of 37.98) on 03/01/2016, 75.7 (ratio > 50.47) on 03/29/2016, 79.7 (ratio 53.13) on 04/26/2016, 108.6 (ratio > 72.4) on 05/24/2016, 8.3 (1.46 ratio) on 10/11/2016, 6.7 (0.81 ratio) on 02/05/2017, 7.8 (1.01 ratio) on 03/21/2017,7.6 (ratio 0.63) on 06/01/2017, and 8.1 (ratio 1.13) on 11/29/2017.  Bone survey on 12/08/2014 was stable.  Bone survey on 10/21/2015 revealed increase conspicuity of subcentimeter lytic lesions in the calvarium.  Bone  marrow aspirate and biopsy on 11/04/2015 revealed an atypical monoclonal plasma cells estimated at 30-40% of marrow cells.   Marrow was variably cellular (approximately 45%) with background trilineage hematopoiesis. There was no significant increase in marrow reticulin fibers. Storage iron was present.    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 He develoed herpes zoster in 04/2008. He developed a pulmonary embolism in 05/2013. He was initially on Xarelto, but is now on Eliquis. He had an episode of pneumonia around this time requiring a brief admission. He developed severe lower leg cramps on 08/07/2014 secondary to hypokalemia. Duplex was negative.   He was treated for C difficile colitis (Flagyl completed 07/30/2015).  He has a chronic indwelling pain pump.  He received 4 cycles of Pomalyst and Decadron (08/27/2015 - 11/19/2015).  Restaging studies document progressive disease.  Kappa free light chains are increasing.  SPEP revealed 0.5 gm/dL monoclonal protein then 1.3 gm/dL.  Bone survey reveals increase conspicuity of subcentimeter lytic lesions in the calvarium.  Bone marrow reveals 30-40% plasma cells.   MUGA on 11/30/2015 revealed an ejection fraction of 46%.  He is felt not to be a good candidate for Kyprolis.  There were no focal wall motion abnormalities.  He had a stress echo less than 1 year ago.  He has a history of PVCs and atrial fibrillation.  He takes Cardizem prn.  He received 17 weeks of daratumumab (Darzalex) (12/09/2015 - 05/25/2016).  He tolerated treatment well without side effect.  Bone marrow on 02/09/2016 revealed no diagnostic morphologic evidence of plasma cell myeloma.  Marrow was normocellular to hypocellular marrow for age (ranging from 10-40%) with maturing trilineage hematopoiesis and mild multilineage dyspoiesis.  There was patchy mild increase in reticulin.  Storage iron was present.  Flow cytometry  revealed no definitive evidence of monoclonality.  There was a non-specific atypical myeloid and monocytic findings with no increase in blasts.  Cytogenetics were normal (46, XY).  He is currently 22 months s/p 2nd autologous stem cell transplant on 06/16/2016.  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  He also experienced atrial fibrillation with intermittent episodes of RVR requiring IV beta blockers.  He is on prophylactic valacyclovir for  1 year post transplant.  He started vaccinations (DTaP-Pediatric triple vaccine, Hep B- Pediatrix triple vaccine, Haemophilus influenza B (Hib), inactivated polio virus (IPV), pneumococcal conjugate vaccine 13-valent (PCV 13)) on 04/17/2017.  Recommendation was for Shingrix vaccine to be given in [place] and second dose of Shingrix in 2 months.  He had follow-up vaccinations on 08/28/2017.  He had his second shingles vaccine. He received 18 month vaccinations - DTaP-Pediatric triple vaccine, Hep B- Pediatrix triple vaccine, Haemophilus influenza B (Hib), inactivated polio virus (IPV), pneumococcal conjugate vaccine 13-valent (PCV 13) on 02/05/2018.  He is s/p 16 cycles of Daratumumab (Darzalex) post transplant (12/05/2016 - 03/21/2018).  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  Work-up on 08/23/2016 revealed the following normal studies:  B12 and folate.  Ferritin was 1379 (high).  Iron studies included a saturation of 56% and TIBC 141 (low).  ESR was 64.  Thrombocytopenia has resolved.  He has a history of hypomagnesemia and receives IV magnesium (2-4 gm) weekly.  He has not required magnesium since 10/24/2016.  Symptomatically, patient is doing well overall. He notes increased nocturnal diaphoresis over the course of the last 2 weeks. Chronically loose stools managed with PRN loperamide. Neuropathy persists and remains unchanged. No other acute concerns. Exam is stable. WBC is 8600  with an [place] of 6500.  Hemoglobin is 12.5, hematocrit 34.8, platelets 191,000.  Creatinine has improved to 1.35 (CrCl 46.2 mL/min). M-spike 0.3 (elevated).   Plan:  1. Labs today:  CBC with diff, CMP, Mg, SPEP. 2. Labs reviewed. Blood counts stable and adequate enough for treatment. Will proceed with cycle #17 daratumumab. 3. Patient brings premedications from home.  Patient has taking ondansetron 8 mg, Tylenol 650 mg, and diphenhydramine 50 mg this morning prior to arrival. 4. Continue apixaban 5 mg twice daily as previously scheduled 5. Discuss follow-up with [facility] transplant center.  Patient will require further immunizations (pneumococcal PPV23 or Pneumovax, MMR) at the 63-monthmark.  [facility]  [facility]. RTC in 4 weeks for MD assessment, labs (CBC with diff, CMP, Mg, SPEP), and cycle #18 daratumumab.   BHonor Loh07/10/19, 1:21 PM

## 2018-04-18 ENCOUNTER — Inpatient Hospital Stay: Payer: Medicare Other | Attending: Hematology and Oncology

## 2018-04-18 ENCOUNTER — Inpatient Hospital Stay: Payer: Medicare Other

## 2018-04-18 ENCOUNTER — Inpatient Hospital Stay (HOSPITAL_BASED_OUTPATIENT_CLINIC_OR_DEPARTMENT_OTHER): Payer: Medicare Other | Admitting: Urgent Care

## 2018-04-18 VITALS — BP 132/89 | HR 96 | Temp 97.2°F | Resp 18 | Wt 172.1 lb

## 2018-04-18 DIAGNOSIS — R61 Generalized hyperhidrosis: Secondary | ICD-10-CM

## 2018-04-18 DIAGNOSIS — Z86711 Personal history of pulmonary embolism: Secondary | ICD-10-CM

## 2018-04-18 DIAGNOSIS — C9001 Multiple myeloma in remission: Secondary | ICD-10-CM

## 2018-04-18 DIAGNOSIS — C9002 Multiple myeloma in relapse: Secondary | ICD-10-CM

## 2018-04-18 DIAGNOSIS — I10 Essential (primary) hypertension: Secondary | ICD-10-CM | POA: Diagnosis not present

## 2018-04-18 DIAGNOSIS — Z87891 Personal history of nicotine dependence: Secondary | ICD-10-CM

## 2018-04-18 DIAGNOSIS — C9000 Multiple myeloma not having achieved remission: Secondary | ICD-10-CM

## 2018-04-18 DIAGNOSIS — Z5112 Encounter for antineoplastic immunotherapy: Secondary | ICD-10-CM

## 2018-04-18 DIAGNOSIS — R197 Diarrhea, unspecified: Secondary | ICD-10-CM | POA: Diagnosis not present

## 2018-04-18 DIAGNOSIS — Z9484 Stem cells transplant status: Secondary | ICD-10-CM

## 2018-04-18 DIAGNOSIS — Z7901 Long term (current) use of anticoagulants: Secondary | ICD-10-CM

## 2018-04-18 LAB — CBC WITH DIFFERENTIAL/PLATELET
BASOS ABS: 0 10*3/uL (ref 0–0.1)
Basophils Relative: 0 %
EOS PCT: 2 %
Eosinophils Absolute: 0.1 10*3/uL (ref 0–0.7)
HCT: 34.8 % — ABNORMAL LOW (ref 40.0–52.0)
HEMOGLOBIN: 12.5 g/dL — AB (ref 13.0–18.0)
LYMPHS ABS: 1.4 10*3/uL (ref 1.0–3.6)
LYMPHS PCT: 16 %
MCH: 36.9 pg — AB (ref 26.0–34.0)
MCHC: 36 g/dL (ref 32.0–36.0)
MCV: 102.7 fL — ABNORMAL HIGH (ref 80.0–100.0)
Monocytes Absolute: 0.5 10*3/uL (ref 0.2–1.0)
Monocytes Relative: 6 %
NEUTROS PCT: 76 %
Neutro Abs: 6.5 10*3/uL (ref 1.4–6.5)
PLATELETS: 191 10*3/uL (ref 150–440)
RBC: 3.39 MIL/uL — AB (ref 4.40–5.90)
RDW: 14.4 % (ref 11.5–14.5)
WBC: 8.6 10*3/uL (ref 3.8–10.6)

## 2018-04-18 LAB — COMPREHENSIVE METABOLIC PANEL
ALT: 14 U/L (ref 0–44)
ANION GAP: 8 (ref 5–15)
AST: 21 U/L (ref 15–41)
Albumin: 4 g/dL (ref 3.5–5.0)
Alkaline Phosphatase: 41 U/L (ref 38–126)
BUN: 20 mg/dL (ref 8–23)
CO2: 23 mmol/L (ref 22–32)
Calcium: 8.9 mg/dL (ref 8.9–10.3)
Chloride: 107 mmol/L (ref 98–111)
Creatinine, Ser: 1.35 mg/dL — ABNORMAL HIGH (ref 0.61–1.24)
GFR, EST AFRICAN AMERICAN: 59 mL/min — AB (ref >60)
GFR, EST NON AFRICAN AMERICAN: 51 mL/min — AB (ref >60)
Glucose, Bld: 161 mg/dL — ABNORMAL HIGH (ref 70–99)
POTASSIUM: 4.2 mmol/L (ref 3.5–5.1)
Sodium: 138 mmol/L (ref 135–145)
TOTAL PROTEIN: 6.1 g/dL — AB (ref 6.5–8.1)
Total Bilirubin: 0.6 mg/dL (ref 0.3–1.2)

## 2018-04-18 LAB — MAGNESIUM: MAGNESIUM: 1.9 mg/dL (ref 1.7–2.4)

## 2018-04-18 MED ORDER — SODIUM CHLORIDE 0.9% FLUSH
10.0000 mL | Freq: Once | INTRAVENOUS | Status: AC
  Administered 2018-04-18: 10 mL via INTRAVENOUS
  Filled 2018-04-18: qty 10

## 2018-04-18 MED ORDER — HEPARIN SOD (PORK) LOCK FLUSH 100 UNIT/ML IV SOLN
500.0000 [IU] | Freq: Once | INTRAVENOUS | Status: AC
  Administered 2018-04-18: 500 [IU] via INTRAVENOUS
  Filled 2018-04-18: qty 5

## 2018-04-18 MED ORDER — SODIUM CHLORIDE 0.9 % IV SOLN
1200.0000 mg | Freq: Once | INTRAVENOUS | Status: AC
  Administered 2018-04-18: 1200 mg via INTRAVENOUS
  Filled 2018-04-18: qty 60

## 2018-04-18 MED ORDER — SODIUM CHLORIDE 0.9 % IV SOLN
Freq: Once | INTRAVENOUS | Status: AC
  Administered 2018-04-18: 09:00:00 via INTRAVENOUS
  Filled 2018-04-18: qty 1000

## 2018-04-18 NOTE — Progress Notes (Signed)
Patient took premeds prior to appointment.

## 2018-04-18 NOTE — Progress Notes (Signed)
Patient states over the past 2 weeks he has awakened around 1 AM and his shirt is drenched in sweat  States his wife keeps the thermostat on 50.  When he goes to bed he is under a sheet and 2 blankets.  Wonders if it is blanket related.  It is not every night, rather, sporatic in nature.

## 2018-04-19 LAB — PROTEIN ELECTROPHORESIS, SERUM
A/G Ratio: 1.5 (ref 0.7–1.7)
ALPHA-1-GLOBULIN: 0.1 g/dL (ref 0.0–0.4)
ALPHA-2-GLOBULIN: 0.7 g/dL (ref 0.4–1.0)
Albumin ELP: 3.5 g/dL (ref 2.9–4.4)
BETA GLOBULIN: 0.9 g/dL (ref 0.7–1.3)
GLOBULIN, TOTAL: 2.3 g/dL (ref 2.2–3.9)
Gamma Globulin: 0.6 g/dL (ref 0.4–1.8)
M-SPIKE, %: 0.3 g/dL — AB
Total Protein ELP: 5.8 g/dL — ABNORMAL LOW (ref 6.0–8.5)

## 2018-05-01 NOTE — Progress Notes (Signed)
05/02/2018 8:51 AM   Logan Perez Logan Perez 1946-05-23 [id number]  Referring provider: Sofie Hartigan, MD [address] [place], [place] [address]  Chief Complaint  Patient presents with  . Benign Prostatic Hypertrophy    1year    HPI: Patient is a 72 year old Caucasian male who presents today for a one year follow up for BPH with LUTS.    BPH WITH LUTS His IPSS score today is 4, which is mild lower urinary tract symptomatology.  He is delighted with his quality life due to his urinary symptoms.   His previous I PSS score was 4/2.  His main urinary complaints today are nocturia x 2.   He denies any dysuria, hematuria or suprapubic pain.  He currently taking tamsulosin 0.4 mg daily.   He also denies any recent fevers, chills, nausea or vomiting.   IPSS    Row Name 05/02/18 0800         International Prostate Symptom Score   How often have you had the sensation of not emptying your bladder?  Less than 1 in 5     How often have you had to urinate less than every two hours?  Not at All     How often have you found you stopped and started again several times when you urinated?  Not at All     How often have you found it difficult to postpone urination?  Not at All     How often have you had a weak urinary stream?  Less than 1 in 5 times     How often have you had to strain to start urination?  Not at All     How many times did you typically get up at night to urinate?  2 Times     Total IPSS Score  4       Quality of Life due to urinary symptoms   If you were to spend the rest of your life with your urinary condition just the way it is now how would you feel about that?  Delighted        Score:  1-7 Mild 8-19 Moderate 20-35 Severe  PMH: Past Medical History:  Diagnosis Date  . Anxiety   . Atrial fibrillation ([place])   . BPH (benign prostatic hyperplasia)   . Complication of anesthesia    BAD HEADACHE NIGHT OF FIRST CATARACT  . Compression fracture of lumbar  vertebra ([place])   . Difficulty voiding   . Dysrhythmia    A FIB  . Elevated PSA   . GERD (gastroesophageal reflux disease)   . Hearing aid worn    bilateral  . History of kidney stones   . HLD (hyperlipidemia)   . HOH (hard of hearing)   . Hypertension   . Multiple myeloma ([place])   . Neuropathy    feet. 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  . Pain    BACK  . Palpitations   . Pneumonia   . Pulmonary embolism ([place])   . Sepsis ([place])   . Stroke [facility])    TIA, detected on CT scan. pt was unaware    Surgical History: Past Surgical History:  Procedure Laterality Date  . BACK SURGERY  1994  . CATARACT EXTRACTION W/PHACO Right 05/02/2016   Procedure: CATARACT EXTRACTION PHACO AND INTRAOCULAR LENS PLACEMENT (IOC);  Surgeon: Birder Robson, MD;  Location: [facility];  Service: Ophthalmology;  Laterality: Right;  [place] 1.06 AP% 20.6 CDE 13.70 FLUID PACK LOT #  [id number]  . CATARACT EXTRACTION W/PHACO Left 05/16/2016   Procedure: CATARACT EXTRACTION PHACO AND INTRAOCULAR LENS PLACEMENT (IOC);  Surgeon: Birder Robson, MD;  Location: [facility];  Service: Ophthalmology;  Laterality: Left;  [place] 01:43 AP% 19.8 CDE 20.45 FLUID PACK LOT #[id number] H  . COLONOSCOPY WITH PROPOFOL N/A 03/22/2017   Procedure: COLONOSCOPY WITH PROPOFOL;  Surgeon: Lucilla Lame, MD;  Location: [place];  Service: Endoscopy;  Laterality: N/A;  has port  . ESOPHAGOGASTRODUODENOSCOPY (EGD) WITH PROPOFOL  03/22/2017   Procedure: ESOPHAGOGASTRODUODENOSCOPY (EGD) WITH PROPOFOL;  Surgeon: Lucilla Lame, MD;  Location: [place];  Service: Endoscopy;;  . EYE SURGERY    . KNEE ARTHROSCOPY Left 1992  . LIMBAL STEM CELL TRANSPLANT    . PAIN PUMP IMPLANTATION  2012  . PAIN PUMP IMPLANTATION N/A 09/04/2017   Procedure: INTRATHECAL PUMP BATTERY CHANGE;  Surgeon: Milinda Pointer, MD;  Location: [facility];  Service: Neurosurgery;  Laterality: N/A;  . PORTA CATH INSERTION N/A 12/04/2016   Procedure: Glori Luis Cath Insertion;  Surgeon: Algernon Huxley, MD;  Location: [facility];  Service: Cardiovascular;  Laterality: N/A;  . stem cell implant  2008   [facility]    Home Medications:  Allergies as of 05/02/2018      Reactions   Azithromycin Diarrhea, Other (See Comments)   Possible cause of C. Diff Possible cause of C. Diff Possible cause of C. Diff Possible cause of C. 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 Always use your most recent med list.          acetaminophen 325 MG tablet Commonly known as:  TYLENOL Take 650 mg every 6 (six) hours as needed by mouth for mild pain or headache.   ALPRAZolam 0.25 MG tablet Commonly known as:  XANAX TAKE 1 TABLET(0.25 MG) BY MOUTH AT BEDTIME   apixaban 5 MG Tabs tablet Commonly known as:  ELIQUIS Take 1 tablet (5 mg total) by mouth 2 (two) times daily.   ARTIFICIAL TEARS OP Place 1 drop as needed into both eyes (for dry eyes).   baclofen 10 MG tablet Commonly known as:  LIORESAL Take 1 tablet (10 mg total) by mouth at bedtime.   bismuth subsalicylate 379 KW/40XB suspension Commonly known as:  PEPTO BISMOL Take 30 mLs by mouth every 6 (six) hours as needed for diarrhea or loose stools.   Calcium-Vitamin D 500-400 MG-UNIT Tabs Take 1 tablet daily by mouth.   cetirizine 10 MG tablet Commonly known as:  ZYRTEC Take 10 mg by mouth daily as needed for allergies.   DARZALEX IV Inject 1,200 mg every 30 (thirty) days into the vein.   dexamethasone 4 MG tablet Commonly known as:  DECADRON Take 4 tabs PO 1 hr prior to infusion, then take 1 tab on day 2 and day 3   diltiazem 120 MG 24 hr capsule Commonly known as:  CARDIZEM CD Take 120 mg daily by mouth.   diltiazem 60 MG  tablet Commonly known as:  CARDIZEM Take 60 mg daily as needed by mouth (for increased heart rate > 140).   diphenhydrAMINE 25 MG tablet Commonly  known as:  BENADRYL Take 25 mg by mouth as directed. With chemo treatment   ENSURE Take 237 mLs by mouth daily.   fluticasone 50 MCG/ACT nasal spray Commonly known as:  FLONASE Place 1 spray into the nose daily as needed for allergies.   loperamide 2 MG capsule Commonly known as:  IMODIUM Take 4 mg as needed by mouth for diarrhea or loose stools.   lovastatin 20 MG tablet Commonly known as:  MEVACOR Take 20 mg by mouth every evening.   montelukast 10 MG tablet Commonly known as:  SINGULAIR Take '10mg'$ s daily the day before the infusion, the day of, and the 2 days after infusion   MULTIVITAMIN/IRON PO Take 1 tablet daily by mouth.   omeprazole 20 MG capsule Commonly known as:  PRILOSEC Take 20 mg by mouth daily.   ondansetron 8 MG tablet Commonly known as:  ZOFRAN Take 1 tablet (8 mg total) by mouth as directed. Take with chemo and can take it as needed   PAIN MANAGEMENT IT PUMP REFILL 1 each by Intrathecal route once for 1 dose. Medication: PF Fentanyl 1,500.0 mcg/ml PF Bupivicaine 30.0 mg/ml PF Clonidine 300.0 mcg/ml Total Volume: 40 ml Needed by 03-04-18'@1100'$    potassium chloride SA 20 MEQ tablet Commonly known as:  K-DUR,KLOR-CON TAKE 1 TABLET BY MOUTH DAILY   ranitidine 150 MG tablet Commonly known as:  ZANTAC Take 150 mg by mouth at bedtime.   tamsulosin 0.4 MG Caps capsule Commonly known as:  FLOMAX Take 1 capsule (0.4 mg total) by mouth daily.   valACYclovir 500 MG tablet Commonly known as:  VALTREX TAKE 1 TABLET BY MOUTH DAILY   vitamin B-12 1000 MCG tablet Commonly known as:  CYANOCOBALAMIN Take 1,000 mcg daily by mouth.   Vitamin D3 2000 units capsule Take 2,000 Units daily by mouth.       Allergies:  Allergies  Allergen Reactions  . Azithromycin Diarrhea and Other (See Comments)    Possible  cause of C. Diff Possible cause of C. Diff Possible cause of C. Diff Possible cause of C. Diff  . Bee Pollen Other (See Comments)    Sneezing, watery eyes, runny nose  . Pollen Extract Other (See Comments)    Sneezing, watery eyes, runny nose  . 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 Rivaroxaban Rash    Family History: Family History  Problem Relation Age of Onset  . Cancer Father        throat  . Kidney disease Sister   . Stroke Unknown   . Stroke Mother   . Bladder Cancer Neg Hx   . Prostate cancer Neg Hx   . Kidney cancer Neg Hx     Social History:  reports that he quit smoking about 42 years ago. His smoking use included cigarettes. He has a 30.00 pack-year smoking history. He has never used smokeless tobacco. He reports that he does not drink alcohol or use drugs.  ROS: UROLOGY Frequent Urination?: No Hard to postpone urination?: No Burning/pain with urination?: No Get up at night to urinate?: Yes Leakage of urine?: No Urine stream starts and stops?: No Trouble starting stream?: No Do you have to strain to urinate?: No Blood in urine?: No Urinary tract infection?: No Sexually transmitted disease?: No Injury to kidneys or bladder?: No Painful intercourse?: No Weak stream?: No Erection problems?: No Penile pain?: No  Gastrointestinal Nausea?: No Vomiting?: No Indigestion/heartburn?: No  Diarrhea?: Yes Constipation?: No  Constitutional Fever: No Night sweats?: No Weight loss?: No Fatigue?: No  Skin Skin rash/lesions?: No Itching?: No  Eyes Blurred vision?: No Double vision?: No  Ears/Nose/Throat Sore throat?: No Sinus problems?: No  Hematologic/Lymphatic Swollen glands?: No Easy bruising?: No  Cardiovascular Leg swelling?: No Chest pain?: No  Respiratory Cough?: No Shortness of breath?: No  Endocrine Excessive thirst?: No  Musculoskeletal Back pain?: Yes Joint pain?:  No  Neurological Headaches?: No Dizziness?: No  Psychologic Depression?: No Anxiety?: No  Physical Exam: BP 100/62   Pulse (!) 103   Ht 5' 7" (1.702 m)   Wt 168 lb (76.2 kg)   BMI 26.31 kg/m   Constitutional: Well nourished. Alert and oriented, No acute distress. HEENT: [place] AT, moist mucus membranes. Trachea midline, no masses. Cardiovascular: No clubbing, cyanosis, or edema. Respiratory: Normal respiratory effort, no increased work of breathing. GI: Abdomen is soft, non tender, non distended, no abdominal masses. Liver and spleen not palpable.  No hernias appreciated.  Stool sample for occult testing is not indicated.   GU: No CVA tenderness.  No bladder fullness or masses.  Patient with circumcised phallus.   Urethral meatus is patent.  No penile discharge. No penile lesions or rashes. Scrotum without lesions, cysts, rashes and/or edema.  Testicles are located scrotally bilaterally. No masses are appreciated in the testicles. Left and right epididymis are normal. Rectal: Patient with  normal sphincter tone. Anus and perineum without scarring or rashes. No rectal masses are appreciated. Prostate is approximately 50 grams, no nodules are appreciated. Seminal vesicles are normal. Skin: No rashes, bruises or suspicious lesions. Lymph: No cervical or inguinal adenopathy. Neurologic: Grossly intact, no focal deficits, moving all 4 extremities. Psychiatric: Normal mood and affect.   Laboratory Data: Lab Results  Component Value Date   WBC 8.6 04/18/2018   HGB 12.5 (L) 04/18/2018   HCT 34.8 (L) 04/18/2018   MCV 102.7 (H) 04/18/2018   PLT 191 04/18/2018    Lab Results  Component Value Date   CREATININE 1.35 (H) 04/18/2018    PSA History:    2.9 ng/mL on 04/30/2012    1.9 ng/mL on 05/01/2013    1.5 ng/mL on 04/30/2014  Lab Results  Component Value Date   AST 21 04/18/2018   Lab Results  Component Value Date   ALT 14 04/18/2018   I have reviewed the  labs   Assessment & Plan:    1. BPH with LUTS IPSS score is 4/0, it is improving Continue conservative management, avoiding bladder irritants Continue tamsulosin 0.4 mg daily;refills given RTC in 12 months for IPSS and exam   Return in about 1 year (around 05/03/2019) for IPSS, PSA and exam.  These notes generated with voice recognition software. I apologize for typographical errors.  Zara Council, PA-C  [facility] [address] [place], [place] [address] [phone]

## 2018-05-02 ENCOUNTER — Ambulatory Visit (INDEPENDENT_AMBULATORY_CARE_PROVIDER_SITE_OTHER): Payer: Medicare Other | Admitting: Urology

## 2018-05-02 ENCOUNTER — Encounter: Payer: Self-pay | Admitting: Urology

## 2018-05-02 VITALS — BP 100/62 | HR 103 | Ht 67.0 in | Wt 168.0 lb

## 2018-05-02 DIAGNOSIS — N138 Other obstructive and reflux uropathy: Secondary | ICD-10-CM | POA: Diagnosis not present

## 2018-05-02 DIAGNOSIS — N401 Enlarged prostate with lower urinary tract symptoms: Secondary | ICD-10-CM

## 2018-05-02 MED ORDER — TAMSULOSIN HCL 0.4 MG PO CAPS: 0.4000 mg | ORAL_CAPSULE | Freq: Every day | ORAL | 4 refills | Status: DC

## 2018-05-03 LAB — PSA: Prostate Specific Ag, Serum: 0.9 ng/mL (ref 0.0–4.0)

## 2018-05-10 ENCOUNTER — Other Ambulatory Visit: Payer: Self-pay | Admitting: Hematology and Oncology

## 2018-05-10 DIAGNOSIS — C9001 Multiple myeloma in remission: Secondary | ICD-10-CM

## 2018-05-13 ENCOUNTER — Other Ambulatory Visit: Payer: Self-pay | Admitting: Hematology and Oncology

## 2018-05-13 DIAGNOSIS — C9001 Multiple myeloma in remission: Secondary | ICD-10-CM

## 2018-05-15 ENCOUNTER — Other Ambulatory Visit: Payer: Self-pay | Admitting: Nurse Practitioner

## 2018-05-15 ENCOUNTER — Telehealth: Payer: Self-pay

## 2018-05-15 DIAGNOSIS — M62838 Other muscle spasm: Secondary | ICD-10-CM

## 2018-05-15 MED ORDER — BACLOFEN 10 MG PO TABS: 10.0000 mg | ORAL_TABLET | Freq: Two times a day (BID) | ORAL | 5 refills | Status: DC

## 2018-05-15 NOTE — Telephone Encounter (Signed)
Thank you! Patient notified

## 2018-05-15 NOTE — Telephone Encounter (Signed)
Called patient to clarify.  Patient states that he has taken 7-8 extra over the last couple of months.  States he takes the baclofen for restless leg.  Sometimes around 2-3 am, he has to take an extra one.  He states that he discussed with Dr Dossie Arbour and it was okayed.  Thanks.

## 2018-05-15 NOTE — Telephone Encounter (Signed)
New Rx for BID to be sent in  Thanks

## 2018-05-15 NOTE — Progress Notes (Signed)
[facility] day:  05/16/18  Chief Complaint: Fortino Haag is a 72 y.o. 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   HPI: The patient was last seen in the medical oncology clinic on 04/18/2018.  At that time, patient complained of an increase in his nocturnal diaphoresis x 2 weeks. No other actue concerns. Bowels remained "about the same". He continued on PRN loperamide. Neuropathy was stable. Eating well; weight up 5 pounds.  Exam was stable. WBC 8600 (Dexter 6500). Creatinine 1.35 (CrCL 46.3 mL/min). M-spike 0.3. He received cycle #17 daratumumab.   He was seen in follow up consult on 05/02/2018 by Zara Council, PA. Notes reviewed. Patient doing well. BPH with associated LUTs had improved. PSA checked and found to be normal at 0.9 (0-4 ng/mL). Patient to continue on tamsulosin daily and follow up with urology in 1 year.   In the interim, he has had no complaints.  His chronic diarrhea is well controlled.  Chronic back pain is 3 out of 10.  Overall, he feels better than he did months ago.  He has been trying to make an appointment with [facility].   Past Medical History:  Diagnosis Date  . Anxiety   . Atrial fibrillation ([place])   . BPH (benign prostatic hyperplasia)   . Complication of anesthesia    BAD HEADACHE NIGHT OF FIRST CATARACT  . Compression fracture of lumbar vertebra ([place])   . Difficulty voiding   . Dysrhythmia    A FIB  . Elevated PSA   . GERD (gastroesophageal reflux disease)   . Hearing aid worn    bilateral  . History of kidney stones   . HLD (hyperlipidemia)   . HOH (hard of hearing)   . Hypertension   . Multiple myeloma ([place])   . Neuropathy    feet. 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  . Pain    BACK  . Palpitations   . Pneumonia   . Pulmonary embolism ([place])   . Sepsis  ([place])   . Stroke [facility])    TIA, detected on CT scan. pt was unaware    Past Surgical History:  Procedure Laterality Date  . BACK SURGERY  1994  . CATARACT EXTRACTION W/PHACO Right 05/02/2016   Procedure: CATARACT EXTRACTION PHACO AND INTRAOCULAR LENS PLACEMENT (IOC);  Surgeon: Birder Robson, MD;  Location: [facility] ORS;  Service: Ophthalmology;  Laterality: Right;  [place] 1.06 AP% 20.6 CDE 13.70 FLUID PACK LOT # [id number]  . CATARACT EXTRACTION W/PHACO Left 05/16/2016   Procedure: CATARACT EXTRACTION PHACO AND INTRAOCULAR LENS PLACEMENT (IOC);  Surgeon: Birder Robson, MD;  Location: [facility] ORS;  Service: Ophthalmology;  Laterality: Left;  [place] 01:43 AP% 19.8 CDE 20.45 FLUID PACK LOT #[id number] H  . COLONOSCOPY WITH PROPOFOL N/A 03/22/2017   Procedure: COLONOSCOPY WITH PROPOFOL;  Surgeon: Lucilla Lame, MD;  Location: [place];  Service: Endoscopy;  Laterality: N/A;  has port  . ESOPHAGOGASTRODUODENOSCOPY (EGD) WITH PROPOFOL  03/22/2017   Procedure: ESOPHAGOGASTRODUODENOSCOPY (EGD) WITH PROPOFOL;  Surgeon: Lucilla Lame, MD;  Location: [place];  Service: Endoscopy;;  . EYE SURGERY    . KNEE ARTHROSCOPY Left 1992  . LIMBAL STEM CELL TRANSPLANT    . PAIN PUMP IMPLANTATION  2012  . PAIN PUMP IMPLANTATION N/A 09/04/2017   Procedure: INTRATHECAL PUMP BATTERY CHANGE;  Surgeon: Milinda Pointer,  MD;  Location: [facility] ORS;  Service: Neurosurgery;  Laterality: N/A;  . PORTA CATH INSERTION N/A 12/04/2016   Procedure: Glori Luis Cath Insertion;  Surgeon: Algernon Huxley, MD;  Location: [place] CV LAB;  Service: Cardiovascular;  Laterality: N/A;  . stem cell implant  2008   [facility]    Family History  Problem Relation Age of Onset  . Cancer Father        throat  . Kidney disease Sister   . Stroke Unknown   . Stroke Mother   . Bladder Cancer Neg Hx   . Prostate cancer Neg Hx   . Kidney cancer Neg Hx     Social History:  reports that he quit smoking about 42 years ago. His smoking use  included cigarettes. He has a 30.00 pack-year smoking history. He has never used smokeless tobacco. He reports that he does not drink alcohol or use drugs.  He has a wire haired dachshund.  He lives in [place].  His wife's name is [name].  The patient is alone today.  Allergies:  Allergies  Allergen Reactions  . Azithromycin Diarrhea and Other (See Comments)    Possible cause of C. Diff Possible cause of C. Diff Possible cause of C. Diff Possible cause of C. Diff  . Bee Pollen Other (See Comments)    Sneezing, watery eyes, runny nose  . Pollen Extract Other (See Comments)    Sneezing, watery eyes, runny nose  . 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 Rivaroxaban Rash    Current Medications: Current Outpatient Medications  Medication Sig Dispense Refill  . acetaminophen (TYLENOL) 325 MG tablet Take 650 mg every 6 (six) hours as needed by mouth for mild pain or headache.     . ALPRAZolam (XANAX) 0.25 MG tablet TAKE 1 TABLET(0.25 MG) BY MOUTH AT BEDTIME 30 tablet 0  . baclofen (LIORESAL) 10 MG tablet Take 1 tablet (10 mg total) by mouth 2 (two) times daily. 60 tablet 5  . bismuth subsalicylate (PEPTO BISMOL) 262 MG/15ML suspension Take 30 mLs by mouth every 6 (six) hours as needed for diarrhea or loose stools.    . Calcium Carb-Cholecalciferol (CALCIUM-VITAMIN D) 500-400 MG-UNIT TABS Take 1 tablet daily by mouth.    . cetirizine (ZYRTEC) 10 MG tablet Take 10 mg by mouth daily as needed for allergies.     . Cholecalciferol (VITAMIN D3) 2000 units capsule Take 2,000 Units daily by mouth.     . Daratumumab (DARZALEX IV) Inject 1,200 mg every 30 (thirty) days into the vein.     [name] dexamethasone (DECADRON) 4 MG tablet Take 4 tabs PO 1 hr prior to infusion, then take 1 tab on day 2 and day 3 30 tablet 1  . diltiazem (CARDIZEM CD) 120 MG 24 hr capsule Take 120 mg daily by mouth.     . diltiazem (CARDIZEM) 60 MG tablet Take 60 mg daily  as needed by mouth (for increased heart rate > 140).     [name] diphenhydrAMINE (BENADRYL) 25 MG tablet Take 25 mg by mouth as directed. With chemo treatment    . ELIQUIS 5 MG TABS tablet TAKE 1 TABLET(5 MG) BY MOUTH TWICE DAILY 60 tablet 0  . ENSURE (ENSURE) Take 237 mLs by mouth daily.    . fluticasone (FLONASE) 50 MCG/ACT nasal spray Place 1 spray into the nose daily as needed for allergies.     . Hypromellose (ARTIFICIAL TEARS  OP) Place 1 drop as needed into both eyes (for dry eyes).    [name] loperamide (IMODIUM) 2 MG capsule Take 4 mg as needed by mouth for diarrhea or loose stools.     . lovastatin (MEVACOR) 20 MG tablet Take 20 mg by mouth every evening.     . montelukast (SINGULAIR) 10 MG tablet Take 67ms daily the day before the infusion, the day of, and the 2 days after infusion 30 tablet 0  . Multiple Vitamins-Iron (MULTIVITAMIN/IRON PO) Take 1 tablet daily by mouth.    .Marland Kitchenomeprazole (PRILOSEC) 20 MG capsule Take 20 mg by mouth daily.     . ondansetron (ZOFRAN) 8 MG tablet Take 1 tablet (8 mg total) by mouth as directed. Take with chemo and can take it as needed 20 tablet 1  . PAIN MANAGEMENT IT PUMP REFILL 1 each by Intrathecal route once for 1 dose. Medication: PF Fentanyl 1,500.0 mcg/ml PF Bupivicaine 30.0 mg/ml PF Clonidine 300.0 mcg/ml Total Volume: 40 ml Needed by 03-04-18'@1100'  1 each 0  . potassium chloride SA (K-DUR,KLOR-CON) 20 MEQ tablet TAKE 1 TABLET BY MOUTH DAILY 90 tablet 0  . ranitidine (ZANTAC) 150 MG tablet Take 150 mg by mouth at bedtime.     . tamsulosin (FLOMAX) 0.4 MG CAPS capsule Take 1 capsule (0.4 mg total) by mouth daily. 90 capsule 4  . valACYclovir (VALTREX) 500 MG tablet TAKE 1 TABLET BY MOUTH DAILY 30 tablet 0  . vitamin B-12 (CYANOCOBALAMIN) 1000 MCG tablet Take 1,000 mcg daily by mouth.      No current facility-administered medications for this visit.    Facility-Administered Medications Ordered in Other Visits  Medication Dose Route Frequency Provider Last  Rate Last Dose  . heparin lock flush 100 unit/mL  500 Units Intravenous Once Corcoran, Melissa C, MD      . sodium chloride flush (NS) 0.9 % injection 10 mL  10 mL Intravenous PRN CLequita Asal MD   10 mL at 05/16/18 0810    Review of Systems  Constitutional: Positive for weight loss (2 pounds). Negative for chills, diaphoresis, fever and malaise/fatigue.       Feels "better than I have been in a few months".  HENT: Positive for hearing loss (hearing aids). Negative for congestion, ear discharge, ear pain, nosebleeds, sinus pain and sore throat.   Eyes: Negative.  Negative for blurred vision, double vision, photophobia, pain, discharge and redness.  Respiratory: Negative.  Negative for cough, hemoptysis, sputum production and shortness of breath.   Cardiovascular: Negative for chest pain, palpitations, orthopnea, leg swelling and PND.  Gastrointestinal: Positive for diarrhea (uses loperamide PRN). Negative for abdominal pain, blood in stool, constipation, melena, nausea and vomiting.       Chronic diarrhea (stable).  Genitourinary: Negative.  Negative for dysuria, frequency, hematuria and urgency.  Musculoskeletal: Positive for back pain (chronic - related to known compression fracture. Implanted Baclofen pump). Negative for falls, joint pain, myalgias and neck pain.  Skin: Negative.  Negative for itching and rash.  Neurological: Positive for sensory change (stable neuropathy in fingers and toes). Negative for dizziness, tremors, speech change, focal weakness, weakness and headaches.  Endo/Heme/Allergies: Does not bruise/bleed easily.  Psychiatric/Behavioral: Negative for depression and memory loss. The patient is not nervous/anxious and does not have insomnia.   All other systems reviewed and are negative.  Performance status (ECOG): 1 - Symptomatic but completely ambulatory  Vital signs BP (!) 134/95 (BP Location: Left Arm, Patient Position: Sitting)   Pulse 86  Temp (!) 95.8 F  (35.4 C) (Tympanic)   Resp 18   Wt 170 lb 8 oz (77.3 kg)   BMI 26.70 kg/m   Physical Exam  Constitutional: He is oriented to person, place, and time and well-developed, well-nourished, and in no distress.  HENT:  Head: Normocephalic and atraumatic.  Mouth/Throat: No oropharyngeal exudate.  Short brown hair. Hearing aids.   Eyes: Pupils are equal, round, and reactive to light. Conjunctivae and EOM are normal. No scleral icterus.  Glasses. Blue eyes. Previous cataract surgery.   Neck: Normal range of motion. Neck supple. No JVD present.  Cardiovascular: Normal rate and normal heart sounds. Exam reveals no gallop and no friction rub.  No murmur heard. Pulmonary/Chest: Effort normal and breath sounds normal. No respiratory distress. He has no wheezes. He has no rales.  Abdominal: Soft. Bowel sounds are normal. He exhibits no distension and no mass. There is no tenderness. There is no rebound and no guarding.  RUQ Baclofen pump  Musculoskeletal: Normal range of motion. He exhibits no edema or tenderness.  Lymphadenopathy:    He has no cervical adenopathy.    He has no axillary adenopathy.       Right: No inguinal and no supraclavicular adenopathy present.       Left: No inguinal and no supraclavicular adenopathy present.  Neurological: He is alert and oriented to person, place, and time. Gait normal.  Skin: Skin is warm and dry. No rash noted. No erythema.  Skin graft to tip of nose following Moh's surgery. LEFT wrist with oval (pink) shaped macular lesion.   Psychiatric: Mood, memory, affect and judgment normal.  Nursing note and vitals reviewed.    Infusion on 05/16/2018  Component Date Value Ref Range Status  . Magnesium 05/16/2018 2.0  1.7 - 2.4 mg/dL Final   Performed at [facility], [address] [place], [place] [address]  . Sodium 05/16/2018 138  135 - 145 mmol/L Final  . Potassium 05/16/2018 4.3  3.5 - 5.1 mmol/L Final  . Chloride 05/16/2018 108  98 - 111 mmol/L  Final  . CO2 05/16/2018 21* 22 - 32 mmol/L Final  . Glucose, Bld 05/16/2018 139* 70 - 99 mg/dL Final  . BUN 05/16/2018 19  8 - 23 mg/dL Final  . Creatinine, Ser 05/16/2018 1.42* 0.61 - 1.24 mg/dL Final  . Calcium 05/16/2018 8.9  8.9 - 10.3 mg/dL Final  . Total Protein 05/16/2018 6.3* 6.5 - 8.1 g/dL Final  . Albumin 05/16/2018 3.9  3.5 - 5.0 g/dL Final  . AST 05/16/2018 20  15 - 41 U/L Final  . ALT 05/16/2018 12  0 - 44 U/L Final  . Alkaline Phosphatase 05/16/2018 40  38 - 126 U/L Final  . Total Bilirubin 05/16/2018 0.5  0.3 - 1.2 mg/dL Final  . GFR calc non Af Amer 05/16/2018 48* >60 mL/min Final  . GFR calc Af Amer 05/16/2018 55* >60 mL/min Final   Comment: (NOTE) The eGFR has been calculated using the CKD EPI equation. This calculation has not been validated in all clinical situations. eGFR's persistently <60 mL/min signify possible Chronic Kidney Disease.   Georgiann Hahn gap 05/16/2018 9  5 - 15 Final   Performed at [facility], [address] [place], [place] [address]  . WBC 05/16/2018 6.8  3.8 - 10.6 K/uL Final  . RBC 05/16/2018 3.39* 4.40 - 5.90 MIL/uL Final  . Hemoglobin 05/16/2018 12.2* 13.0 - 18.0 g/dL Final  . HCT 05/16/2018 35.1* 40.0 -  52.0 % Final  . MCV 05/16/2018 103.5* 80.0 - 100.0 fL Final  . MCH 05/16/2018 36.1* 26.0 - 34.0 pg Final  . MCHC 05/16/2018 34.8  32.0 - 36.0 g/dL Final  . RDW 05/16/2018 14.0  11.5 - 14.5 % Final  . Platelets 05/16/2018 202  150 - 440 K/uL Final  . Neutrophils Relative % 05/16/2018 72  % Final  . Neutro Abs 05/16/2018 4.8  1.4 - 6.5 K/uL Final  . Lymphocytes Relative 05/16/2018 20  % Final  . Lymphs Abs 05/16/2018 1.4  1.0 - 3.6 K/uL Final  . Monocytes Relative 05/16/2018 6  % Final  . Monocytes Absolute 05/16/2018 0.4  0.2 - 1.0 K/uL Final  . Eosinophils Relative 05/16/2018 2  % Final  . Eosinophils Absolute 05/16/2018 0.1  0 - 0.7 K/uL Final  . Basophils Relative 05/16/2018 0  % Final  . Basophils Absolute 05/16/2018 0.0  0 -  0.1 K/uL Final   Performed at [facility], [address] [place], [place] [address]    Assessment:  Stevens Magwood is a 72 y.o. male with stage III mutiple myeloma.  He initially presented with progressive back pain beginning in 12/2006.  MRI revealed "spots and compression fractures".  He began Velcade, thalidomide, and Decadron.  In 08/2007, he underwent high dose chemotherapy and autologous stem cell transplant.  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  He recurred with a rising M-spike (2.7) with repeat M spike (1.7 gm/dl) in 03/2010.  He was initially treated with Velcade (02/08/2010 - 05/10/2010).  He then began Revlimid (15 mg 3 weeks on/1 week off) and Decadron (40 mg on day 1, 8, 15, 22).  Because of significant side effect with Decadron his dose was decreased to 10 mg once a week in 07/2010.    He was on maintenance Revlimid. Revlimid was initially10 mg 3 weeks on/1 week off. This was changed to 10 mg 2 weeks on/2 weeks off secondary to right nipple tenderness. His dose was increased to 10 mg 3 weeks on/1 week off with Decadron 10 mg a week (on Sundays) and then Revlamid 15 mg 3 weeks on and 1 week off with Decadron on Sundays.  He began Pomalyst 4 mg 3 weeks on/1 week off with Decadron on 08/27/2015.  Over the past year his SPEP has revealed no monoclonal protein (04/21/2015) and 0.5 gm/dL on 09/22/2015 and 10/20/2015. M spike was 0.1 on 02/02/2016, 03/01/2016, 03/29/2016, 04/26/2016, and 05/24/2016.  M spike was 0 on 10/11/2016, 11/28/2016, 02/05/2017, 03/21/2017, 08/02/2017, 10/04/2017, 12/27/2017, 01/24/2018, and 02/21/2018.  M spike was 0.1 on 11/01/2017, 0.1 on 11/29/2017, 0 on 12/27/2017, 0 on 01/24/2018, 0 on 02/21/2018, 0.1 on 03/21/2018, and 0.3 on 04/18/2018.  Free light chains have been monitored. Kappa free light chains were 18.54 on 11/21/2013, 18.37 on 02/20/2014, 18.93 on 05/22/2014, 32.58 (high; normal ratio 1.27) on 08/21/2014,  51.53 (high; elevated ratio of 2.12) on 11/13/2014, 28.08 (ratio 1.73) on 12/09/2014, 23.71 (ratio 2.17) on 01/18/2015, 92.93 (ratio 9.49) on 04/21/2015, 93.44 (ratio 10.28) on 05/26/2015, 255.45 (ratio of 24.05) on 07/14/2015, 373.89 (ratio 48.31) on 08/04/2015, 474.33 (ratio 70.58) on 08/25/2015, 450.76 (ratio 55.44) on 09/22/2015, 453.4 (ratio 59.89) on 10/20/2015, 58.26 (ratio of > 40.74) on 02/02/2016, 62.67 (ratio of 37.98) on 03/01/2016, 75.7 (ratio > 50.47) on 03/29/2016, 79.7 (ratio 53.13) on 04/26/2016, 108.6 (ratio > 72.4) on 05/24/2016, 8.3 (1.46 ratio) on 10/11/2016, 6.7 (0.81 ratio) on 02/05/2017, 7.8 (1.01 ratio) on 03/21/2017,7.6 (ratio 0.63) on 06/01/2017,  and 8.1 (ratio 1.13) on 11/29/2017.  Bone survey on 12/08/2014 was stable.  Bone survey on 10/21/2015 revealed increase conspicuity of subcentimeter lytic lesions in the calvarium.  Bone marrow aspirate and biopsy on 11/04/2015 revealed an atypical monoclonal plasma cells estimated at 30-40% of marrow cells.   Marrow was variably cellular (approximately 45%) with background trilineage hematopoiesis. There was no significant increase in marrow reticulin fibers. Storage iron was present.    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 He develoed herpes zoster in 04/2008. He developed a pulmonary embolism in 05/2013. He was initially on Xarelto, but is now on Eliquis. He had an episode of pneumonia around this time requiring a brief admission. He developed severe lower leg cramps on 08/07/2014 secondary to hypokalemia. Duplex was negative.   He was treated for C difficile colitis (Flagyl completed 07/30/2015).  He has a chronic indwelling pain pump.  He received 4 cycles of Pomalyst and Decadron (08/27/2015 - 11/19/2015).  Restaging studies document progressive disease.  Kappa free light chains are increasing.  SPEP revealed 0.5 gm/dL monoclonal protein then 1.3 gm/dL.  Bone survey  reveals increase conspicuity of subcentimeter lytic lesions in the calvarium.  Bone marrow reveals 30-40% plasma cells.   MUGA on 11/30/2015 revealed an ejection fraction of 46%.  He is felt not to be a good candidate for Kyprolis.  There were no focal wall motion abnormalities. He had a stress echo less than 1 year ago.  He has a history of PVCs and atrial fibrillation.  He takes Cardizem prn.  He received 17 weeks of daratumumab (Darzalex) (12/09/2015 - 05/25/2016).  He tolerated treatment well without side effect.  Bone marrow on 02/09/2016 revealed no diagnostic morphologic evidence of plasma cell myeloma.  Marrow was normocellular to hypocellular marrow for age (ranging from 10-40%) with maturing trilineage hematopoiesis and mild multilineage dyspoiesis.  There was patchy mild increase in reticulin.  Storage iron was present.  Flow cytometry revealed no definitive evidence of monoclonality.  There was a non-specific atypical myeloid and monocytic findings with no increase in blasts.  Cytogenetics were normal (46, XY).  He is currently 23 months s/p 2nd autologous stem cell transplant on 06/16/2016.  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  He also experienced atrial fibrillation with intermittent episodes of RVR requiring IV beta blockers.  He is on prophylactic valacyclovir for 1 year post transplant.  He started vaccinations (DTaP-Pediatric triple vaccine, Hep B- Pediatrix triple vaccine, Haemophilus influenza B (Hib), inactivated polio virus (IPV), pneumococcal conjugate vaccine 13-valent (PCV 13)) on 04/17/2017.  Recommendation was for Shingrix vaccine to be given in [place] and second dose of Shingrix in 2 months.  He had follow-up vaccinations on 08/28/2017.  He had his second shingles vaccine. He received 18 month vaccinations - DTaP-Pediatric triple vaccine, Hep B- Pediatrix triple vaccine, Haemophilus influenza B (Hib), inactivated  polio virus (IPV), pneumococcal conjugate vaccine 13-valent (PCV 13) on 02/05/2018.  He is s/p 17 cycles of Daratumumab (Darzalex) post transplant (12/05/2016 - 04/18/2018).  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  Work-up on 08/23/2016 revealed the following normal studies:  B12 and folate.  Ferritin was 1379 (high).  Iron studies included a saturation of 56% and TIBC 141 (low).  ESR was 64.  Thrombocytopenia has resolved.  He has a history of hypomagnesemia that required IV magnesium (2-4 gm) weekly.  He has not required magnesium since  10/24/2016.  Symptomatically, he is doing well.  Exam is stable.  Plan:  1. Labs today: CBC with diff, CMP, Mg, SPEP 2. Multiple myeloma - treatment ongoing  No recent infections. M-spike has ranged from 0 - 0.3 since 10/2017. Last M-spike was 0.3 on 04/18/2018. SPEP pending today.    Upcoming follow visit with [facility] transplant center has not been scheduled.   Patient will require further immunizations (pneumococcal PPV23 or Pneumovax, MMR) during 24 month follow up visit at [facility].   Contact Dr. Renetta Chalk [phone]).  Labs reviewed. Blood counts stable and adequate enough for treatment. Will proceed with cycle #18 daratumumab.   Patient has taken his own premedications at home. He has already taken ondansetron 8 mg, APAP 650 mg, and diphenhydramine 50 mg prior to arrival. Discuss symptom management.  Patient has antiemetics and pain medications at home to use on a PRN basis. Patient advising that the prescribed interventions are adequate at this point. He is also followed by pain management. Continue all medications as previously prescribed.  3. Atrial fibrillation - stable  Rate controlled, with no recent RVR episodes. No bruising or bleeding. Continue chronic apixaban 5 mg BID as previously prescribed.  4. RTC in 4 weeks for MD assessment, labs (CBC with diff, CMP, Mg, SPEP, FLCA), and cycle #19 daratumumab.     Lequita Asal 05/16/18, 8:51 AM

## 2018-05-15 NOTE — Telephone Encounter (Signed)
Received a call from patient stating he is out of his baclofen due to having to take 2 per day at times.  States Dr Dossie Arbour said he could take an extra one as needed.  Could we change his script to reflect this so that he can get the medicine refilled.  Patient has one pill left.  He gets a 3 month supply of 90 pills, with 5 refills, but has gone through them and cant get refill yet.  If you need more info, let me know.

## 2018-05-16 ENCOUNTER — Other Ambulatory Visit: Payer: Self-pay | Admitting: Urgent Care

## 2018-05-16 ENCOUNTER — Encounter: Payer: Self-pay | Admitting: Hematology and Oncology

## 2018-05-16 ENCOUNTER — Inpatient Hospital Stay: Payer: Medicare Other

## 2018-05-16 ENCOUNTER — Inpatient Hospital Stay: Payer: Medicare Other | Attending: Hematology and Oncology

## 2018-05-16 ENCOUNTER — Inpatient Hospital Stay (HOSPITAL_BASED_OUTPATIENT_CLINIC_OR_DEPARTMENT_OTHER): Payer: Medicare Other | Admitting: Hematology and Oncology

## 2018-05-16 VITALS — BP 134/95 | HR 86 | Temp 95.8°F | Resp 18 | Wt 170.5 lb

## 2018-05-16 DIAGNOSIS — Z5112 Encounter for antineoplastic immunotherapy: Secondary | ICD-10-CM | POA: Diagnosis present

## 2018-05-16 DIAGNOSIS — C9001 Multiple myeloma in remission: Secondary | ICD-10-CM

## 2018-05-16 DIAGNOSIS — I4891 Unspecified atrial fibrillation: Secondary | ICD-10-CM | POA: Diagnosis not present

## 2018-05-16 DIAGNOSIS — C9002 Multiple myeloma in relapse: Secondary | ICD-10-CM

## 2018-05-16 DIAGNOSIS — Z9484 Stem cells transplant status: Secondary | ICD-10-CM

## 2018-05-16 DIAGNOSIS — C9000 Multiple myeloma not having achieved remission: Secondary | ICD-10-CM

## 2018-05-16 LAB — CBC WITH DIFFERENTIAL/PLATELET
BASOS ABS: 0 10*3/uL (ref 0–0.1)
Basophils Relative: 0 %
EOS PCT: 2 %
Eosinophils Absolute: 0.1 10*3/uL (ref 0–0.7)
HEMATOCRIT: 35.1 % — AB (ref 40.0–52.0)
Hemoglobin: 12.2 g/dL — ABNORMAL LOW (ref 13.0–18.0)
LYMPHS ABS: 1.4 10*3/uL (ref 1.0–3.6)
LYMPHS PCT: 20 %
MCH: 36.1 pg — AB (ref 26.0–34.0)
MCHC: 34.8 g/dL (ref 32.0–36.0)
MCV: 103.5 fL — AB (ref 80.0–100.0)
MONOS PCT: 6 %
Monocytes Absolute: 0.4 10*3/uL (ref 0.2–1.0)
NEUTROS ABS: 4.8 10*3/uL (ref 1.4–6.5)
Neutrophils Relative %: 72 %
Platelets: 202 10*3/uL (ref 150–440)
RBC: 3.39 MIL/uL — ABNORMAL LOW (ref 4.40–5.90)
RDW: 14 % (ref 11.5–14.5)
WBC: 6.8 10*3/uL (ref 3.8–10.6)

## 2018-05-16 LAB — COMPREHENSIVE METABOLIC PANEL
ALBUMIN: 3.9 g/dL (ref 3.5–5.0)
ALT: 12 U/L (ref 0–44)
ANION GAP: 9 (ref 5–15)
AST: 20 U/L (ref 15–41)
Alkaline Phosphatase: 40 U/L (ref 38–126)
BILIRUBIN TOTAL: 0.5 mg/dL (ref 0.3–1.2)
BUN: 19 mg/dL (ref 8–23)
CHLORIDE: 108 mmol/L (ref 98–111)
CO2: 21 mmol/L — AB (ref 22–32)
Calcium: 8.9 mg/dL (ref 8.9–10.3)
Creatinine, Ser: 1.42 mg/dL — ABNORMAL HIGH (ref 0.61–1.24)
GFR calc Af Amer: 55 mL/min — ABNORMAL LOW (ref >60)
GFR calc non Af Amer: 48 mL/min — ABNORMAL LOW (ref >60)
GLUCOSE: 139 mg/dL — AB (ref 70–99)
Potassium: 4.3 mmol/L (ref 3.5–5.1)
SODIUM: 138 mmol/L (ref 135–145)
TOTAL PROTEIN: 6.3 g/dL — AB (ref 6.5–8.1)

## 2018-05-16 LAB — MAGNESIUM: Magnesium: 2 mg/dL (ref 1.7–2.4)

## 2018-05-16 MED ORDER — HEPARIN SOD (PORK) LOCK FLUSH 100 UNIT/ML IV SOLN
500.0000 [IU] | Freq: Once | INTRAVENOUS | Status: AC
  Administered 2018-05-16: 500 [IU] via INTRAVENOUS
  Filled 2018-05-16: qty 5

## 2018-05-16 MED ORDER — SODIUM CHLORIDE 0.9 % IV SOLN
Freq: Once | INTRAVENOUS | Status: AC
  Administered 2018-05-16: 11:00:00 via INTRAVENOUS
  Filled 2018-05-16: qty 1000

## 2018-05-16 MED ORDER — DIPHENHYDRAMINE HCL 25 MG PO CAPS
ORAL_CAPSULE | ORAL | Status: AC
  Filled 2018-05-16: qty 2

## 2018-05-16 MED ORDER — ACETAMINOPHEN 325 MG PO TABS
ORAL_TABLET | ORAL | Status: AC
  Filled 2018-05-16: qty 2

## 2018-05-16 MED ORDER — SODIUM CHLORIDE 0.9% FLUSH
10.0000 mL | INTRAVENOUS | Status: DC | PRN
  Administered 2018-05-16: 10 mL via INTRAVENOUS
  Filled 2018-05-16: qty 10

## 2018-05-16 MED ORDER — SODIUM CHLORIDE 0.9 % IV SOLN
1200.0000 mg | Freq: Once | INTRAVENOUS | Status: AC
  Administered 2018-05-16: 1200 mg via INTRAVENOUS
  Filled 2018-05-16: qty 60

## 2018-05-16 NOTE — Progress Notes (Signed)
Patient offers no complaints today. 

## 2018-05-18 LAB — PROTEIN ELECTROPHORESIS, SERUM
A/G Ratio: 1.5 (ref 0.7–1.7)
Albumin ELP: 3.5 g/dL (ref 2.9–4.4)
Alpha-1-Globulin: 0.1 g/dL (ref 0.0–0.4)
Alpha-2-Globulin: 0.9 g/dL (ref 0.4–1.0)
BETA GLOBULIN: 0.8 g/dL (ref 0.7–1.3)
GAMMA GLOBULIN: 0.3 g/dL — AB (ref 0.4–1.8)
Globulin, Total: 2.3 g/dL (ref 2.2–3.9)
M-SPIKE, %: 0.1 g/dL — AB
Total Protein ELP: 5.8 g/dL — ABNORMAL LOW (ref 6.0–8.5)

## 2018-05-21 ENCOUNTER — Other Ambulatory Visit: Payer: Self-pay

## 2018-05-21 MED ORDER — PAIN MANAGEMENT IT PUMP REFILL: 1.0000 | Freq: Once | INTRATHECAL | 0 refills | Status: DC

## 2018-06-06 ENCOUNTER — Other Ambulatory Visit: Payer: Self-pay | Admitting: Hematology and Oncology

## 2018-06-06 DIAGNOSIS — C9001 Multiple myeloma in remission: Secondary | ICD-10-CM

## 2018-06-07 ENCOUNTER — Inpatient Hospital Stay: Payer: Medicare Other | Admitting: Oncology

## 2018-06-11 ENCOUNTER — Other Ambulatory Visit: Payer: Self-pay | Admitting: Urgent Care

## 2018-06-11 DIAGNOSIS — C9001 Multiple myeloma in remission: Secondary | ICD-10-CM

## 2018-06-12 ENCOUNTER — Other Ambulatory Visit: Payer: Self-pay

## 2018-06-12 ENCOUNTER — Other Ambulatory Visit: Payer: Self-pay | Admitting: *Deleted

## 2018-06-12 ENCOUNTER — Ambulatory Visit: Payer: Medicare Other | Attending: Nurse Practitioner | Admitting: Nurse Practitioner

## 2018-06-12 ENCOUNTER — Encounter: Payer: Self-pay | Admitting: Nurse Practitioner

## 2018-06-12 VITALS — BP 112/85 | HR 100 | Temp 98.5°F | Resp 18 | Ht 67.0 in | Wt 168.0 lb

## 2018-06-12 DIAGNOSIS — M7918 Myalgia, other site: Secondary | ICD-10-CM | POA: Diagnosis not present

## 2018-06-12 DIAGNOSIS — G894 Chronic pain syndrome: Secondary | ICD-10-CM | POA: Diagnosis present

## 2018-06-12 DIAGNOSIS — G893 Neoplasm related pain (acute) (chronic): Secondary | ICD-10-CM

## 2018-06-12 DIAGNOSIS — Z7901 Long term (current) use of anticoagulants: Secondary | ICD-10-CM | POA: Insufficient documentation

## 2018-06-12 DIAGNOSIS — Z79899 Other long term (current) drug therapy: Secondary | ICD-10-CM | POA: Diagnosis not present

## 2018-06-12 DIAGNOSIS — M47816 Spondylosis without myelopathy or radiculopathy, lumbar region: Secondary | ICD-10-CM

## 2018-06-12 DIAGNOSIS — Z451 Encounter for adjustment and management of infusion pump: Secondary | ICD-10-CM | POA: Insufficient documentation

## 2018-06-12 DIAGNOSIS — Z881 Allergy status to other antibiotic agents status: Secondary | ICD-10-CM | POA: Diagnosis not present

## 2018-06-12 DIAGNOSIS — Z95828 Presence of other vascular implants and grafts: Secondary | ICD-10-CM

## 2018-06-12 DIAGNOSIS — C9000 Multiple myeloma not having achieved remission: Secondary | ICD-10-CM

## 2018-06-12 DIAGNOSIS — M545 Low back pain: Secondary | ICD-10-CM | POA: Diagnosis present

## 2018-06-12 NOTE — Progress Notes (Signed)
Safety precautions to be maintained throughout the outpatient stay will include: orient to surroundings, keep bed in low position, maintain call bell within reach at all times, provide assistance with transfer out of bed and ambulation.    Intrathecal pump refill performed via sterile technique with witness Angelique Holm RN.  Patient tolerated well.  5 ml residual was aspirated and wasted in sink.  Patient given new copy of short report for his record. S/s of oversedation reviewed with patient.

## 2018-06-12 NOTE — Progress Notes (Signed)
Patient's Name: Logan Perez  MRN: [id number]  Referring Provider: Sofie Hartigan, MD  DOB: 30-Jul-1946  PCP: Sofie Hartigan, MD  DOS: 06/12/2018  Note by: Dionisio David, NP  Service setting: Ambulatory outpatient  Specialty: Interventional Pain Management  Patient type: Established  Location: [facility] (AMB) Pain Management Facility  Visit type: Interventional Procedure   Primary Reason for Visit: Interventional Pain Management Treatment. CC: Back Pain (low)  Procedure:  Intrathecal Drug Delivery System (IDDS):  Type: Reservoir Refill [phone])       Region: Abdominal Laterality: Right  Type of Pump: Medtronic Synchromed II (MRI-compatible) Delivery Route: Intrathecal Type of Pain Treated: Neuropathic/Nociceptive Primary Medication Class: Opioid/opiate  Medication, Concentration, Infusion Program, & Delivery Rate: Please see scanned programming printout.  Indications: 1. Lumbar spondylosis   2. Diffuse myofascial pain syndrome   3. Cancer associated pain   4. Presence of implanted infusion pump (Medtronic, programmable, intrathecal pump)   5. Chronic pain syndrome    Pain Assessment: Self-Reported Pain Score: 3 /10             Reported level is compatible with observation.        Intrathecal Pump Therapy Assessment  Manufacturer: Medtronic Synchromed II Type: Programmable Volume: 40 mL reservoir MRI compatibility: Yes   Drug content:  Primary Medication Class: Opioid Primary Medication: PF-Fentanyl Secondary Medication: PF-Bupivacaine Other Medication: PF-Clonidine   Programming:  Type: Simple continuous. See pump readout for details.   Changes:  Medication Change: None at this point Rate Change: No change in rate  Reported side-effects or adverse reactions: None reported  Effectiveness: Described as relatively effective, allowing for increase in activities of daily living (ADL) Clinically meaningful improvement in function (CMIF): Sustained CMIF goals  met  Plan: Pump refill today  Pre-op Assessment:  Logan Perez is a 72 y.o. (year old), male patient, seen today for interventional treatment. He  has a past surgical history that includes Knee arthroscopy (Left, 1992); Pain pump implantation (2012); stem cell implant (2008); Cataract extraction w/PHACO (Right, 05/02/2016); Cataract extraction w/PHACO (Left, 05/16/2016); Limbal stem cell transplant; PORTA CATH INSERTION (N/A, 12/04/2016); Colonoscopy with propofol (N/A, 03/22/2017); Esophagogastroduodenoscopy (egd) with propofol (03/22/2017); Eye surgery; Back surgery (1994); and Pain pump implantation (N/A, 09/04/2017). Logan Perez has a current medication list which includes the following prescription(s): acetaminophen, alprazolam, baclofen, bismuth subsalicylate, calcium-vitamin d, cetirizine, vitamin d3, daratumumab, dexamethasone, diltiazem, diltiazem, diphenhydramine, eliquis, ensure, fluticasone, hypromellose, loperamide, lovastatin, montelukast, multiple vitamins-iron, omeprazole, ondansetron, PAIN MANAGEMENT IT PUMP REFILL, potassium chloride sa, ranitidine, tamsulosin, valacyclovir, and vitamin b-12. His primarily concern today is the Back Pain (low)  Initial Vital Signs:  Pulse/HCG Rate: 100  Temp: 98.5 F (36.9 C) Resp: 18 BP: 112/85 SpO2: 100 %  BMI: Estimated body mass index is 26.31 kg/m as calculated from the following:   Height as of this encounter: '5\' 7"'  (1.702 m).   Weight as of this encounter: 168 lb (76.2 kg).  Risk Assessment: Allergies: Reviewed. He is allergic to azithromycin; bee pollen; pollen extract; zoledronic acid; and rivaroxaban.  Allergy Precautions: None required Coagulopathies: Reviewed. None identified.  Blood-thinner therapy: None at this time Active Infection(s): Reviewed. None identified. Logan Perez is afebrile  Site Confirmation: Logan Perez was asked to confirm the procedure and laterality before marking the site Procedure checklist: Completed Consent:  Before the procedure and under the influence of no sedative(s), amnesic(s), or anxiolytics, the patient was informed of the treatment options, risks and possible complications. To fulfill our ethical and legal obligations, as recommended  by the American Medical Association's Code of Ethics, I have informed the patient of my clinical impression; the nature and purpose of the treatment or procedure; the risks, benefits, and possible complications of the intervention; the alternatives, including doing nothing; the risk(s) and benefit(s) of the alternative treatment(s) or procedure(s); and the risk(s) and benefit(s) of doing nothing.  Logan Perez was provided with information about the general risks and possible complications associated with most interventional procedures. These include, but are not limited to: failure to achieve desired goals, infection, bleeding, organ or nerve damage, allergic reactions, paralysis, and/or death.  In addition, he was informed of those risks and possible complications associated to this particular procedure, which include, but are not limited to: damage to the implant; failure to decrease pain; local, systemic, or serious CNS infections, intraspinal abscess with possible cord compression and paralysis, or life-threatening such as meningitis; bleeding; organ damage; nerve injury or damage with subsequent sensory, motor, and/or autonomic system dysfunction, resulting in transient or permanent pain, numbness, and/or weakness of one or several areas of the body; allergic reactions, either minor or major life-threatening, such as anaphylactic or anaphylactoid reactions.  Furthermore, Logan Perez was informed of those risks and complications associated with the medications. These include, but are not limited to: allergic reactions (i.e.: anaphylactic or anaphylactoid reactions); endorphine suppression; bradycardia and/or hypotension; water retention and/or peripheral vascular  relaxation leading to lower extremity edema and possible stasis ulcers; respiratory depression and/or shortness of breath; decreased metabolic rate leading to weight gain; swelling or edema; medication-induced neural toxicity; particulate matter embolism and blood vessel occlusion with resultant organ, and/or nervous system infarction; and/or intrathecal granuloma formation with possible spinal cord compression and permanent paralysis.  Before refilling the pump Logan Perez was informed that some of the medications used in the devise may not be FDA approved for such use and therefore it constitutes an off-label use of the medications.  Finally, he was informed that Medicine is not an exact science; therefore, there is also the possibility of unforeseen or unpredictable risks and/or possible complications that may result in a catastrophic outcome. The patient indicated having understood very clearly. We have given the patient no guarantees and we have made no promises. Enough time was given to the patient to ask questions, all of which were answered to the patient's satisfaction. Logan Perez has indicated that he wanted to continue with the procedure. Attestation: I, the ordering provider, attest that I have discussed with the patient the benefits, risks, side-effects, alternatives, likelihood of achieving goals, and potential problems during recovery for the procedure that I have provided informed consent. Date  Time: 06/12/2018 11:19 AM  Pre-Procedure Preparation:  Monitoring: As per clinic protocol. Respiration, ETCO2, SpO2, BP, heart rate and rhythm monitor placed and checked for adequate function Safety Precautions: Patient was assessed for positional comfort and pressure points before starting the procedure. Time-out: I initiated and conducted the "Time-out" before starting the procedure, as per protocol. The patient was asked to participate by confirming the accuracy of the "Time Out" information.  Verification of the correct person, site, and procedure were performed and confirmed by me, the nursing staff, and the patient. "Time-out" conducted as per Joint Commission's Universal Protocol (UP.01.01.01). Time: 1130  Description of Procedure Process:   Position: Supine Target Area: Central-port of intrathecal pump. Approach: Anterior, 90 degree angle approach. Area Prepped: Entire Area around the pump implant. Prepping solution: ChloraPrep (2% chlorhexidine gluconate and 70% isopropyl alcohol) Safety Precautions: Aspiration looking for blood return was  conducted prior to all injections. At no point did we inject any substances, as a needle was being advanced. No attempts were made at seeking any paresthesias. Safe injection practices and needle disposal techniques used. Medications properly checked for expiration dates. SDV (single dose vial) medications used. Description of the Procedure: Protocol guidelines were followed. Two nurses trained to do implant refills were present during the entire procedure. The refill medication was checked by both healthcare providers as well as the patient. The patient was included in the "Time-out" to verify the medication. The patient was placed in position. The pump was identified. The area was prepped in the usual manner. The sterile template was positioned over the pump, making sure the side-port location matched that of the pump. Both, the pump and the template were held for stability. The needle provided in the Medtronic Kit was then introduced thru the center of the template and into the central port. The pump content was aspirated and discarded volume documented. The new medication was slowly infused into the pump, thru the filter, making sure to avoid overpressure of the device. The needle was then removed and the area cleansed, making sure to leave some of the prepping solution back to take advantage of its long term bactericidal properties. The pump was  interrogated and programmed to reflect the correct medication, volume, and dosage. The program was printed and taken to the physician for approval. Once checked and signed by the physician, a copy was provided to the patient and another scanned into the EMR. Vitals:   06/12/18 1119  BP: 112/85  Pulse: 100  Resp: 18  Temp: 98.5 F (36.9 C)  TempSrc: Oral  SpO2: 100%  Weight: 168 lb (76.2 kg)  Height: '5\' 7"'  (1.702 m)    Start Time: 1130 hrs. End Time:   hrs. Materials & Medications: Medtronic Refill Kit Medication(s): Please see chart orders for details.  Imaging Guidance:          Type of Imaging Technique: None used Indication(s): N/A Exposure Time: No patient exposure Contrast: None used. Fluoroscopic Guidance: N/A Ultrasound Guidance: N/A Interpretation: N/A  Antibiotic Prophylaxis:   Anti-infectives (From admission, onward)   None     Indication(s): None identified  Post-operative Assessment:  Post-procedure Vital Signs:  Pulse/HCG Rate: 100  Temp: 98.5 F (36.9 C) Resp: 18 BP: 112/85 SpO2: 100 %  EBL: None  Complications: No immediate post-treatment complications observed by team, or reported by patient.  Note: The patient tolerated the entire procedure well. A repeat set of vitals were taken after the procedure and the patient was kept under observation following institutional policy, for this type of procedure. Post-procedural neurological assessment was performed, showing return to baseline, prior to discharge. The patient was provided with post-procedure discharge instructions, including a section on how to identify potential problems. Should any problems arise concerning this procedure, the patient was given instructions to immediately contact us, at any time, without hesitation. In any case, we plan to contact the patient by telephone for a follow-up status report regarding this interventional procedure.  Comments:  No additional relevant  information.   Assessment  Primary Diagnosis & Pertinent Problem List: The primary encounter diagnosis was Lumbar spondylosis. Diagnoses of Diffuse myofascial pain syndrome, Cancer associated pain, Presence of implanted infusion pump (Medtronic, programmable, intrathecal pump), and Chronic pain syndrome were also pertinent to this visit.  Status Diagnosis  Controlled Controlled Controlled 1. Lumbar spondylosis   2. Diffuse myofascial pain syndrome   3. Cancer associated  pain   4. Presence of implanted infusion pump (Medtronic, programmable, intrathecal pump)   5. Chronic pain syndrome     Problems updated and reviewed during this visit: No problems updated. Plan of Care   Imaging Orders  No imaging studies ordered today   Procedure Orders    No procedure(s) ordered today    Medications ordered for procedure: No orders of the defined types were placed in this encounter.  Medications administered: Lexine Baton "Rick" had no medications administered during this visit.  See the medical record for exact dosing, route, and time of administration.  New Prescriptions   No medications on file   Disposition: Discharge home  Discharge Date & Time: 06/12/2018; 1201 hrs.   Physician-requested Follow-up: Return in about 3 months (around 09/11/2018) for Pump refill based on programming.  Future Appointments  Date Time Provider [place]  06/13/2018  9:30 AM [facility] FLUSH [facility] None  06/13/2018 10:00 AM Lequita Asal, MD [facility] None  06/13/2018 10:30 AM [facility] INFUSION CHAIR 8 [facility] None  09/11/2018 11:15 AM Vevelyn Francois, NP [facility] None  05/05/2019  9:00 AM [facility] None  05/07/2019  8:30 AM McGowan, Hunt Oris, PA-C [facility] None   Primary Care Physician: Sofie Hartigan, MD Location: [facility] Note by: Dionisio David, NP Date: 06/12/2018; Time: 1:19 PM  Disclaimer:  Medicine is not an exact science. The  only guarantee in medicine is that nothing is guaranteed. It is important to note that the decision to proceed with this intervention was based on the information collected from the patient. The Data and conclusions were drawn from the patient's questionnaire, the interview, and the physical examination. Because the information was provided in large part by the patient, it cannot be guaranteed that it has not been purposely or unconsciously manipulated. Every effort has been made to obtain as much relevant data as possible for this evaluation. It is important to note that the conclusions that lead to this procedure are derived in large part from the available data. Always take into account that the treatment will also be dependent on availability of resources and existing treatment guidelines, considered by other Pain Management Practitioners as being common knowledge and practice, at the time of the intervention. For Medico-Legal purposes, it is also important to point out that variation in procedural techniques and pharmacological choices are the acceptable norm. The indications, contraindications, technique, and results of the above procedure should only be interpreted and judged by a Board-Certified Interventional Pain Specialist with extensive familiarity and expertise in the same exact procedure and technique.

## 2018-06-12 NOTE — Patient Instructions (Signed)
____________________________________________________________________________________________  Medication Rules  Applies to: All patients receiving prescriptions (written or electronic).  Pharmacy of record: Pharmacy where electronic prescriptions will be sent. If written prescriptions are taken to a different pharmacy, please inform the nursing staff. The pharmacy listed in the electronic medical record should be the one where you would like electronic prescriptions to be sent.  Prescription refills: Only during scheduled appointments. Applies to both, written and electronic prescriptions.  NOTE: The following applies primarily to controlled substances (Opioid* Pain Medications).   Patient's responsibilities: 1. Pain Pills: Bring all pain pills to every appointment (except for procedure appointments). 2. Pill Bottles: Bring pills in original pharmacy bottle. Always bring newest bottle. Bring bottle, even if empty. 3. Medication refills: You are responsible for knowing and keeping track of what medications you need refilled. The day before your appointment, write a list of all prescriptions that need to be refilled. Bring that list to your appointment and give it to the admitting nurse. Prescriptions will be written only during appointments. If you forget a medication, it will not be "Called in", "Faxed", or "electronically sent". You will need to get another appointment to get these prescribed. 4. Prescription Accuracy: You are responsible for carefully inspecting your prescriptions before leaving our office. Have the discharge nurse carefully go over each prescription with you, before taking them home. Make sure that your name is accurately spelled, that your address is correct. Check the name and dose of your medication to make sure it is accurate. Check the number of pills, and the written instructions to make sure they are clear and accurate. Make sure that you are given enough medication to last  until your next medication refill appointment. 5. Taking Medication: Take medication as prescribed. Never take more pills than instructed. Never take medication more frequently than prescribed. Taking less pills or less frequently is permitted and encouraged, when it comes to controlled substances (written prescriptions).  6. Inform other Doctors: Always inform, all of your healthcare providers, of all the medications you take. 7. Pain Medication from other Providers: You are not allowed to accept any additional pain medication from any other Doctor or Healthcare provider. There are two exceptions to this rule. (see below) In the event that you require additional pain medication, you are responsible for notifying us, as stated below. 8. Medication Agreement: You are responsible for carefully reading and following our Medication Agreement. This must be signed before receiving any prescriptions from our practice. Safely store a copy of your signed Agreement. Violations to the Agreement will result in no further prescriptions. (Additional copies of our Medication Agreement are available upon request.) 9. Laws, Rules, & Regulations: All patients are expected to follow all Federal and State Laws, Statutes, Rules, & Regulations. Ignorance of the Laws does not constitute a valid excuse. The use of any illegal substances is prohibited. 10. Adopted CDC guidelines & recommendations: Target dosing levels will be at or below 60 MME/day. Use of benzodiazepines** is not recommended.  Exceptions: There are only two exceptions to the rule of not receiving pain medications from other Healthcare Providers. 1. Exception #1 (Emergencies): In the event of an emergency (i.e.: accident requiring emergency care), you are allowed to receive additional pain medication. However, you are responsible for: As soon as you are able, call our office [phone], at any time of the day or night, and leave a message stating your name, the  date and nature of the emergency, and the name and dose of the medication   prescribed. In the event that your call is answered by a member of our staff, make sure to document and save the date, time, and the name of the person that took your information.  2. Exception #2 (Planned Surgery): In the event that you are scheduled by another doctor or dentist to have any type of surgery or procedure, you are allowed (for a period no longer than 30 days), to receive additional pain medication, for the acute post-op pain. However, in this case, you are responsible for picking up a copy of our "Post-op Pain Management for Surgeons" handout, and giving it to your surgeon or dentist. This document is available at our office, and does not require an appointment to obtain it. Simply go to our office during business hours (Monday-Thursday from 8:00 AM to 4:00 PM) (Friday 8:00 AM to 12:00 Noon) or if you have a scheduled appointment with us, prior to your surgery, and ask for it by name. In addition, you will need to provide us with your name, name of your surgeon, type of surgery, and date of procedure or surgery.  *Opioid medications include: morphine, codeine, oxycodone, oxymorphone, hydrocodone, hydromorphone, meperidine, tramadol, tapentadol, buprenorphine, fentanyl, methadone. **Benzodiazepine medications include: diazepam (Valium), alprazolam (Xanax), clonazepam (Klonopine), lorazepam (Ativan), clorazepate (Tranxene), chlordiazepoxide (Librium), estazolam (Prosom), oxazepam (Serax), temazepam (Restoril), triazolam (Halcion) (Last updated: 12/06/2017) ____________________________________________________________________________________________    

## 2018-06-13 ENCOUNTER — Inpatient Hospital Stay: Payer: Medicare Other

## 2018-06-13 ENCOUNTER — Encounter: Payer: Self-pay | Admitting: Hematology and Oncology

## 2018-06-13 ENCOUNTER — Other Ambulatory Visit: Payer: Self-pay | Admitting: Hematology and Oncology

## 2018-06-13 ENCOUNTER — Inpatient Hospital Stay (HOSPITAL_BASED_OUTPATIENT_CLINIC_OR_DEPARTMENT_OTHER): Payer: Medicare Other | Admitting: Hematology and Oncology

## 2018-06-13 ENCOUNTER — Inpatient Hospital Stay: Payer: Medicare Other | Attending: Hematology and Oncology

## 2018-06-13 VITALS — BP 148/84 | HR 81 | Temp 97.7°F | Resp 18

## 2018-06-13 VITALS — BP 137/86 | HR 78 | Temp 98.0°F | Resp 18 | Wt 169.0 lb

## 2018-06-13 DIAGNOSIS — C9001 Multiple myeloma in remission: Secondary | ICD-10-CM | POA: Insufficient documentation

## 2018-06-13 DIAGNOSIS — C9000 Multiple myeloma not having achieved remission: Secondary | ICD-10-CM

## 2018-06-13 DIAGNOSIS — Z9484 Stem cells transplant status: Secondary | ICD-10-CM

## 2018-06-13 DIAGNOSIS — Z5112 Encounter for antineoplastic immunotherapy: Secondary | ICD-10-CM | POA: Diagnosis not present

## 2018-06-13 DIAGNOSIS — C9002 Multiple myeloma in relapse: Secondary | ICD-10-CM

## 2018-06-13 DIAGNOSIS — Z7901 Long term (current) use of anticoagulants: Secondary | ICD-10-CM

## 2018-06-13 DIAGNOSIS — I4891 Unspecified atrial fibrillation: Secondary | ICD-10-CM

## 2018-06-13 LAB — CBC WITH DIFFERENTIAL/PLATELET
Basophils Absolute: 0 10*3/uL (ref 0–0.1)
Basophils Relative: 0 %
Eosinophils Absolute: 0.1 10*3/uL (ref 0–0.7)
Eosinophils Relative: 2 %
HCT: 36 % — ABNORMAL LOW (ref 40.0–52.0)
Hemoglobin: 12.4 g/dL — ABNORMAL LOW (ref 13.0–18.0)
Lymphocytes Relative: 28 %
Lymphs Abs: 1.6 10*3/uL (ref 1.0–3.6)
MCH: 36.1 pg — ABNORMAL HIGH (ref 26.0–34.0)
MCHC: 34.5 g/dL (ref 32.0–36.0)
MCV: 104.6 fL — ABNORMAL HIGH (ref 80.0–100.0)
Monocytes Absolute: 0.4 10*3/uL (ref 0.2–1.0)
Monocytes Relative: 6 %
Neutro Abs: 3.7 10*3/uL (ref 1.4–6.5)
Neutrophils Relative %: 64 %
Platelets: 207 10*3/uL (ref 150–440)
RBC: 3.44 MIL/uL — ABNORMAL LOW (ref 4.40–5.90)
RDW: 13.9 % (ref 11.5–14.5)
WBC: 5.8 10*3/uL (ref 3.8–10.6)

## 2018-06-13 LAB — COMPREHENSIVE METABOLIC PANEL
ALT: 15 U/L (ref 0–44)
AST: 24 U/L (ref 15–41)
Albumin: 4 g/dL (ref 3.5–5.0)
Alkaline Phosphatase: 43 U/L (ref 38–126)
Anion gap: 7 (ref 5–15)
BUN: 18 mg/dL (ref 8–23)
CO2: 23 mmol/L (ref 22–32)
Calcium: 9 mg/dL (ref 8.9–10.3)
Chloride: 107 mmol/L (ref 98–111)
Creatinine, Ser: 1.27 mg/dL — ABNORMAL HIGH (ref 0.61–1.24)
GFR calc Af Amer: >60 mL/min (ref >60)
GFR calc non Af Amer: 55 mL/min — ABNORMAL LOW (ref >60)
Glucose, Bld: 147 mg/dL — ABNORMAL HIGH (ref 70–99)
Potassium: 4.2 mmol/L (ref 3.5–5.1)
Sodium: 137 mmol/L (ref 135–145)
Total Bilirubin: 0.7 mg/dL (ref 0.3–1.2)
Total Protein: 6.2 g/dL — ABNORMAL LOW (ref 6.5–8.1)

## 2018-06-13 LAB — MAGNESIUM: Magnesium: 1.9 mg/dL (ref 1.7–2.4)

## 2018-06-13 MED ORDER — SODIUM CHLORIDE 0.9 % IV SOLN
Freq: Once | INTRAVENOUS | Status: AC
  Administered 2018-06-13: 11:00:00 via INTRAVENOUS
  Filled 2018-06-13: qty 250

## 2018-06-13 MED ORDER — SODIUM CHLORIDE 0.9% FLUSH
10.0000 mL | INTRAVENOUS | Status: DC | PRN
  Filled 2018-06-13: qty 10

## 2018-06-13 MED ORDER — SODIUM CHLORIDE 0.9 % IV SOLN
1200.0000 mg | Freq: Once | INTRAVENOUS | Status: AC
  Administered 2018-06-13: 1200 mg via INTRAVENOUS
  Filled 2018-06-13: qty 60

## 2018-06-13 MED ORDER — HEPARIN SOD (PORK) LOCK FLUSH 100 UNIT/ML IV SOLN
500.0000 [IU] | Freq: Once | INTRAVENOUS | Status: AC | PRN
  Administered 2018-06-13: 500 [IU]
  Filled 2018-06-13: qty 5

## 2018-06-13 NOTE — Progress Notes (Signed)
[facility] day:  06/13/18  Chief Complaint: Logan Perez is a 72 y.o. 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   HPI: The patient was last seen in the medical oncology clinic on 05/16/2018.  At that time, he was doing well.  He denied any acute concerns.  Hematocrit was 35.1, hemoglobin 12.2, platelets 202,000, WBC 6800 with an ANC of 4800.  Creatinine was 1.42.  M spike was 0.1 gm/dL.  He received cycle #18 daratumumab.  During the interim, he has felt "alright".  He notes a lingering cold x 2 weeks.  He states that he blows his nose 2x/day. He denies any cough or fever.  He denies any B symptoms.   Past Medical History:  Diagnosis Date  . Anxiety   . Atrial fibrillation ([place])   . BPH (benign prostatic hyperplasia)   . Complication of anesthesia    BAD HEADACHE NIGHT OF FIRST CATARACT  . Compression fracture of lumbar vertebra ([place])   . Difficulty voiding   . Dysrhythmia    A FIB  . Elevated PSA   . GERD (gastroesophageal reflux disease)   . Hearing aid worn    bilateral  . History of kidney stones   . HLD (hyperlipidemia)   . HOH (hard of hearing)   . Hypertension   . Multiple myeloma ([place])   . Neuropathy    feet. 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  . Pain    BACK  . Palpitations   . Pneumonia   . Pulmonary embolism ([place])   . Sepsis ([place])   . Stroke [facility])    TIA, detected on CT scan. pt was unaware    Past Surgical History:  Procedure Laterality Date  . BACK SURGERY  1994  . CATARACT EXTRACTION W/PHACO Right 05/02/2016   Procedure: CATARACT EXTRACTION PHACO AND INTRAOCULAR LENS PLACEMENT (IOC);  Surgeon: Birder Robson, MD;  Location: [facility];  Service: Ophthalmology;  Laterality: Right;  [place] 1.06 AP% 20.6 CDE 13.70 FLUID PACK LOT # [id number]  . CATARACT EXTRACTION  W/PHACO Left 05/16/2016   Procedure: CATARACT EXTRACTION PHACO AND INTRAOCULAR LENS PLACEMENT (IOC);  Surgeon: Birder Robson, MD;  Location: [facility];  Service: Ophthalmology;  Laterality: Left;  [place] 01:43 AP% 19.8 CDE 20.45 FLUID PACK LOT #[id number] H  . COLONOSCOPY WITH PROPOFOL N/A 03/22/2017   Procedure: COLONOSCOPY WITH PROPOFOL;  Surgeon: Lucilla Lame, MD;  Location: [place];  Service: Endoscopy;  Laterality: N/A;  has port  . ESOPHAGOGASTRODUODENOSCOPY (EGD) WITH PROPOFOL  03/22/2017   Procedure: ESOPHAGOGASTRODUODENOSCOPY (EGD) WITH PROPOFOL;  Surgeon: Lucilla Lame, MD;  Location: [place];  Service: Endoscopy;;  . EYE SURGERY    . KNEE ARTHROSCOPY Left 1992  . LIMBAL STEM CELL TRANSPLANT    . PAIN PUMP IMPLANTATION  2012  . PAIN PUMP IMPLANTATION N/A 09/04/2017   Procedure: INTRATHECAL PUMP BATTERY CHANGE;  Surgeon: Milinda Pointer, MD;  Location: [facility];  Service: Neurosurgery;  Laterality: N/A;  . PORTA CATH INSERTION N/A 12/04/2016   Procedure: Glori Luis Cath Insertion;  Surgeon: Algernon Huxley, MD;  Location: [place] [facility];  Service: Cardiovascular;  Laterality: N/A;  . stem cell implant  2008   [facility]    Family History  Problem Relation Age of Onset  . Cancer Father  throat  . Kidney disease Sister   . Stroke Unknown   . Stroke Mother   . Bladder Cancer Neg Hx   . Prostate cancer Neg Hx   . Kidney cancer Neg Hx     Social History:  reports that he quit smoking about 42 years ago. His smoking use included cigarettes. He has a 30.00 pack-year smoking history. He has never used smokeless tobacco. He reports that he does not drink alcohol or use drugs.  He has a wire haired dachshund.  He lives in [place].  His wife's name is [name].  The patient is alone today.  Allergies:  Allergies  Allergen Reactions  . Azithromycin Diarrhea and Other (See Comments)    Possible cause of C. Diff Possible cause of C. Diff Possible cause of C. Diff Possible  cause of C. Diff  . Bee Pollen Other (See Comments)    Sneezing, watery eyes, runny nose  . Pollen Extract Other (See Comments)    Sneezing, watery eyes, runny nose  . 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 Rivaroxaban Rash    Current Medications: Current Outpatient Medications  Medication Sig Dispense Refill  . acetaminophen (TYLENOL) 325 MG tablet Take 650 mg every 6 (six) hours as needed by mouth for mild pain or headache.     . ALPRAZolam (XANAX) 0.25 MG tablet TAKE 1 TABLET BY MOUTH EVERY NIGHT AT BEDTIME 30 tablet 0  . baclofen (LIORESAL) 10 MG tablet Take 1 tablet (10 mg total) by mouth 2 (two) times daily. 60 tablet 5  . bismuth subsalicylate (PEPTO BISMOL) 262 MG/15ML suspension Take 30 mLs by mouth every 6 (six) hours as needed for diarrhea or loose stools.    . Calcium Carb-Cholecalciferol (CALCIUM-VITAMIN D) 500-400 MG-UNIT TABS Take 1 tablet daily by mouth.    . cetirizine (ZYRTEC) 10 MG tablet Take 10 mg by mouth daily as needed for allergies.     . Cholecalciferol (VITAMIN D3) 2000 units capsule Take 2,000 Units daily by mouth.     . Daratumumab (DARZALEX IV) Inject 1,200 mg every 30 (thirty) days into the vein.     [name] dexamethasone (DECADRON) 4 MG tablet Take 4 tabs PO 1 hr prior to infusion, then take 1 tab on day 2 and day 3 30 tablet 1  . diltiazem (CARDIZEM CD) 120 MG 24 hr capsule Take 120 mg daily by mouth.     . diltiazem (CARDIZEM) 60 MG tablet Take 60 mg daily as needed by mouth (for increased heart rate > 140).     [name] diphenhydrAMINE (BENADRYL) 25 MG tablet Take 25 mg by mouth as directed. With chemo treatment    . ELIQUIS 5 MG TABS tablet TAKE 1 TABLET(5 MG) BY MOUTH TWICE DAILY 60 tablet 0  . ENSURE (ENSURE) Take 237 mLs by mouth daily.    . fluticasone (FLONASE) 50 MCG/ACT nasal spray Place 1 spray into the nose daily as needed for allergies.     . Hypromellose (ARTIFICIAL TEARS OP) Place 1 drop as  needed into both eyes (for dry eyes).    [name] loperamide (IMODIUM) 2 MG capsule Take 4 mg as needed by mouth for diarrhea or loose stools.     . lovastatin (MEVACOR) 20 MG tablet Take 20 mg by mouth every evening.     . montelukast (SINGULAIR) 10 MG tablet Take 39ms daily the day before the infusion, the day of, and the  2 days after infusion 30 tablet 0  . Multiple Vitamins-Iron (MULTIVITAMIN/IRON PO) Take 1 tablet daily by mouth.    [name] omeprazole (PRILOSEC) 20 MG capsule Take 20 mg by mouth daily.     . ondansetron (ZOFRAN) 8 MG tablet Take 1 tablet (8 mg total) by mouth as directed. Take with chemo and can take it as needed 20 tablet 1  . PAIN MANAGEMENT IT PUMP REFILL 1 each by Intrathecal route once for 1 dose. Medication: PF Fentanyl 1,500.0 mcg/ml PF Bupivicaine 30.0 mg/ml PF Clonidine 300.0 mcg/ml Total Volume: 40 ml Needed by 06-12-18 @ 1000 1 each 0  . potassium chloride SA (K-DUR,KLOR-CON) 20 MEQ tablet TAKE 1 TABLET BY MOUTH DAILY 90 tablet 0  . ranitidine (ZANTAC) 150 MG tablet Take 150 mg by mouth at bedtime.     . tamsulosin (FLOMAX) 0.4 MG CAPS capsule Take 1 capsule (0.4 mg total) by mouth daily. 90 capsule 4  . valACYclovir (VALTREX) 500 MG tablet TAKE 1 TABLET BY MOUTH DAILY 30 tablet 0  . vitamin B-12 (CYANOCOBALAMIN) 1000 MCG tablet Take 1,000 mcg daily by mouth.      No current facility-administered medications for this visit.     Review of Systems  Constitutional: Negative.  Negative for chills, diaphoresis, fever, malaise/fatigue and weight loss (up 1 pound).       Feels "alright".  Lingering cold.  HENT: Positive for congestion (blows nose 2x/day) and hearing loss (hearing aids). Negative for ear discharge, ear pain, nosebleeds, sinus pain and sore throat.   Eyes: Negative.  Negative for blurred vision, double vision, photophobia, pain, discharge and redness.  Respiratory: Negative.  Negative for cough, hemoptysis, sputum production and shortness of breath.    Cardiovascular: Negative.  Negative for chest pain, palpitations, orthopnea, leg swelling and PND.  Gastrointestinal: Positive for diarrhea (chronic - uses loperamide PRN). Negative for abdominal pain, blood in stool, constipation, heartburn, melena, nausea and vomiting.  Genitourinary: Negative.  Negative for dysuria, frequency, hematuria and urgency.  Musculoskeletal: Positive for back pain (chronic - related to known compression fracture. Implanted Baclofen pump filled yesterday). Negative for falls, joint pain, myalgias and neck pain.  Skin: Negative.  Negative for itching and rash.  Neurological: Positive for sensory change (stable neuropathy in fingers and toes). Negative for dizziness, tremors, speech change, focal weakness, weakness and headaches.  Endo/Heme/Allergies: Does not bruise/bleed easily.  Psychiatric/Behavioral: Negative for depression, memory loss and suicidal ideas. The patient is not nervous/anxious and does not have insomnia.   All other systems reviewed and are negative.  Performance status (ECOG): 1  Vital signs BP 137/86 (BP Location: Right Arm, Patient Position: Sitting)   Pulse 78   Temp 98 F (36.7 C) (Oral)   Resp 18   Wt 169 lb (76.7 kg)   SpO2 98%   BMI 26.47 kg/m   Physical Exam  Constitutional: He is oriented to person, place, and time and well-developed, well-nourished, and in no distress.  HENT:  Head: Normocephalic and atraumatic.  Mouth/Throat: No oropharyngeal exudate.  Short brown hair. Hearing aids.   Eyes: Pupils are equal, round, and reactive to light. Conjunctivae and EOM are normal. No scleral icterus.  Glasses. Blue eyes s/p cataract surgery.   Neck: Normal range of motion. Neck supple. No JVD present.  Cardiovascular: Normal rate, regular rhythm and normal heart sounds. Exam reveals no gallop and no friction rub.  No murmur heard. Pulmonary/Chest: Effort normal and breath sounds normal. No respiratory distress. He has no wheezes. He  has no rales.  Abdominal: Soft. Bowel sounds are normal. He exhibits no distension and no mass. There is no tenderness. There is no rebound and no guarding.  RUQ Baclofen pump.  Musculoskeletal: Normal range of motion. He exhibits no edema or tenderness.  Lymphadenopathy:    He has no cervical adenopathy.    He has no axillary adenopathy.       Right: No supraclavicular adenopathy present.       Left: No supraclavicular adenopathy present.  Neurological: He is alert and oriented to person, place, and time. Gait normal.  Skin: Skin is warm and dry. No rash noted. No erythema.  Skin graft to tip of nose following Moh's surgery.  Psychiatric: Mood, memory, affect and judgment normal.  Nursing note and vitals reviewed.    Infusion on 06/13/2018  Component Date Value Ref Range Status  . Magnesium 06/13/2018 1.9  1.7 - 2.4 mg/dL Final   Performed at [facility], [address], [place], [place] [address]  . Sodium 06/13/2018 137  135 - 145 mmol/L Final  . Potassium 06/13/2018 4.2  3.5 - 5.1 mmol/L Final  . Chloride 06/13/2018 107  98 - 111 mmol/L Final  . CO2 06/13/2018 23  22 - 32 mmol/L Final  . Glucose, Bld 06/13/2018 147* 70 - 99 mg/dL Final  . BUN 06/13/2018 18  8 - 23 mg/dL Final  . Creatinine, Ser 06/13/2018 1.27* 0.61 - 1.24 mg/dL Final  . Calcium 06/13/2018 9.0  8.9 - 10.3 mg/dL Final  . Total Protein 06/13/2018 6.2* 6.5 - 8.1 g/dL Final  . Albumin 06/13/2018 4.0  3.5 - 5.0 g/dL Final  . AST 06/13/2018 24  15 - 41 U/L Final  . ALT 06/13/2018 15  0 - 44 U/L Final  . Alkaline Phosphatase 06/13/2018 43  38 - 126 U/L Final  . Total Bilirubin 06/13/2018 0.7  0.3 - 1.2 mg/dL Final  . GFR calc non Af Amer 06/13/2018 55* >60 mL/min Final  . GFR calc Af Amer 06/13/2018 >60  >60 mL/min Final   Comment: (NOTE) The eGFR has been calculated using the CKD EPI equation. This calculation has not been validated in all clinical situations. eGFR's persistently <60 mL/min signify  possible Chronic Kidney Disease.   Georgiann Hahn gap 06/13/2018 7  5 - 15 Final   Performed at [facility], [address] [place], [place] [address]  . WBC 06/13/2018 5.8  3.8 - 10.6 K/uL Final  . RBC 06/13/2018 3.44* 4.40 - 5.90 MIL/uL Final  . Hemoglobin 06/13/2018 12.4* 13.0 - 18.0 g/dL Final  . HCT 06/13/2018 36.0* 40.0 - 52.0 % Final  . MCV 06/13/2018 104.6* 80.0 - 100.0 fL Final  . MCH 06/13/2018 36.1* 26.0 - 34.0 pg Final  . MCHC 06/13/2018 34.5  32.0 - 36.0 g/dL Final  . RDW 06/13/2018 13.9  11.5 - 14.5 % Final  . Platelets 06/13/2018 207  150 - 440 K/uL Final  . Neutrophils Relative % 06/13/2018 64  % Final  . Neutro Abs 06/13/2018 3.7  1.4 - 6.5 K/uL Final  . Lymphocytes Relative 06/13/2018 28  % Final  . Lymphs Abs 06/13/2018 1.6  1.0 - 3.6 K/uL Final  . Monocytes Relative 06/13/2018 6  % Final  . Monocytes Absolute 06/13/2018 0.4  0.2 - 1.0 K/uL Final  . Eosinophils Relative 06/13/2018 2  % Final  . Eosinophils Absolute 06/13/2018 0.1  0 - 0.7 K/uL Final  . Basophils Relative 06/13/2018 0  % Final  .  Basophils Absolute 06/13/2018 0.0  0 - 0.1 K/uL Final   Performed at [facility], [address], [place], [place] [address]    Assessment:  Logan Perez is a 72 y.o. male with stage III mutiple myeloma.  He initially presented with progressive back pain beginning in 12/2006.  MRI revealed "spots and compression fractures".  He began Velcade, thalidomide, and Decadron.  In 08/2007, he underwent high dose chemotherapy and autologous stem cell transplant.  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  He recurred with a rising M-spike (2.7) with repeat M spike (1.7 gm/dl) in 03/2010.  He was initially treated with Velcade (02/08/2010 - 05/10/2010).  He then began Revlimid (15 mg 3 weeks on/1 week off) and Decadron (40 mg on day 1, 8, 15, 22).  Because of significant side effect with Decadron his dose was decreased to 10 mg once a week in  07/2010.    He was on maintenance Revlimid. Revlimid was initially10 mg 3 weeks on/1 week off. This was changed to 10 mg 2 weeks on/2 weeks off secondary to right nipple tenderness. His dose was increased to 10 mg 3 weeks on/1 week off with Decadron 10 mg a week (on Sundays) and then Revlamid 15 mg 3 weeks on and 1 week off with Decadron on Sundays.  He began Pomalyst 4 mg 3 weeks on/1 week off with Decadron on 08/27/2015.  Over the past year his SPEP has revealed no monoclonal protein (04/21/2015) and 0.5 gm/dL on 09/22/2015 and 10/20/2015. M spike was 0.1 on 02/02/2016, 03/01/2016, 03/29/2016, 04/26/2016, and 05/24/2016.  M spike was 0 on 10/11/2016, 11/28/2016, 02/05/2017, 03/21/2017, 08/02/2017, 10/04/2017, 12/27/2017, 01/24/2018, and 02/21/2018.  M spike was 0.1 on 11/01/2017, 0.1 on 11/29/2017, 0 on 12/27/2017, 0 on 01/24/2018, 0 on 02/21/2018, 0.1 on 03/21/2018, 0.3 on 04/18/2018, 0.1 on 05/16/2018, and 0 on 06/13/2018.  Free light chains have been monitored. Kappa free light chains were 18.54 on 11/21/2013, 18.37 on 02/20/2014, 18.93 on 05/22/2014, 32.58 (high; normal ratio 1.27) on 08/21/2014, 51.53 (high; elevated ratio of 2.12) on 11/13/2014, 28.08 (ratio 1.73) on 12/09/2014, 23.71 (ratio 2.17) on 01/18/2015, 92.93 (ratio 9.49) on 04/21/2015, 93.44 (ratio 10.28) on 05/26/2015, 255.45 (ratio of 24.05) on 07/14/2015, 373.89 (ratio 48.31) on 08/04/2015, 474.33 (ratio 70.58) on 08/25/2015, 450.76 (ratio 55.44) on 09/22/2015, 453.4 (ratio 59.89) on 10/20/2015, 58.26 (ratio of > 40.74) on 02/02/2016, 62.67 (ratio of 37.98) on 03/01/2016, 75.7 (ratio > 50.47) on 03/29/2016, 79.7 (ratio 53.13) on 04/26/2016, 108.6 (ratio > 72.4) on 05/24/2016, 8.3 (1.46 ratio) on 10/11/2016, 6.7 (0.81 ratio) on 02/05/2017, 7.8 (1.01 ratio) on 03/21/2017,7.6 (ratio 0.63) on 06/01/2017, 8.1 (ratio 1.13) on 11/29/2017, 9.6 (ratio 1.55) on 06/13/2018.  Bone survey on 12/08/2014 was stable.  Bone survey on 10/21/2015  revealed increase conspicuity of subcentimeter lytic lesions in the calvarium.  Bone marrow aspirate and biopsy on 11/04/2015 revealed an atypical monoclonal plasma cells estimated at 30-40% of marrow cells.   Marrow was variably cellular (approximately 45%) with background trilineage hematopoiesis. There was no significant increase in marrow reticulin fibers. Storage iron was present.    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 He develoed herpes zoster in 04/2008. He developed a pulmonary embolism in 05/2013. He was initially on Xarelto, but is now on Eliquis. He had an episode of pneumonia around this time requiring a brief admission. He developed severe lower leg cramps on 08/07/2014 secondary to hypokalemia. Duplex was negative.  He was treated for C difficile colitis (Flagyl completed 07/30/2015).  He has a chronic indwelling pain pump.  He received 4 cycles of Pomalyst and Decadron (08/27/2015 - 11/19/2015).  Restaging studies document progressive disease.  Kappa free light chains are increasing.  SPEP revealed 0.5 gm/dL monoclonal protein then 1.3 gm/dL.  Bone survey reveals increase conspicuity of subcentimeter lytic lesions in the calvarium.  Bone marrow reveals 30-40% plasma cells.   MUGA on 11/30/2015 revealed an ejection fraction of 46%.  He is felt not to be a good candidate for Kyprolis.  There were no focal wall motion abnormalities. He had a stress echo less than 1 year ago.  He has a history of PVCs and atrial fibrillation.  He takes Cardizem prn.  He received 17 weeks of daratumumab (Darzalex) (12/09/2015 - 05/25/2016).  He tolerated treatment well without side effect.  Bone marrow on 02/09/2016 revealed no diagnostic morphologic evidence of plasma cell myeloma.  Marrow was normocellular to hypocellular marrow for age (ranging from 10-40%) with maturing trilineage hematopoiesis and mild multilineage dyspoiesis.  There  was patchy mild increase in reticulin.  Storage iron was present.  Flow cytometry revealed no definitive evidence of monoclonality.  There was a non-specific atypical myeloid and monocytic findings with no increase in blasts.  Cytogenetics were normal (46, XY).  He is currently 24 months s/p 2nd autologous stem cell transplant on 06/16/2016.  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  He also experienced atrial fibrillation with intermittent episodes of RVR requiring IV beta blockers.  He is on prophylactic valacyclovir for 1 year post transplant.  He started vaccinations (DTaP-Pediatric triple vaccine, Hep B- Pediatrix triple vaccine, Haemophilus influenza B (Hib), inactivated polio virus (IPV), pneumococcal conjugate vaccine 13-valent (PCV 13)) on 04/17/2017.  Recommendation was for Shingrix vaccine to be given in [place] and second dose of Shingrix in 2 months.  He had follow-up vaccinations on 08/28/2017.  He had his second shingles vaccine. He received 18 month vaccinations - DTaP-Pediatric triple vaccine, Hep B- Pediatrix triple vaccine, Haemophilus influenza B (Hib), inactivated polio virus (IPV), pneumococcal conjugate vaccine 13-valent (PCV 13) on 02/05/2018.  He is s/p 18 cycles of Daratumumab (Darzalex) post transplant (12/05/2016 - 05/16/2018).  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  Work-up on 08/23/2016 revealed the following normal studies:  B12 and folate.  Ferritin was 1379 (high).  Iron studies included a saturation of 56% and TIBC 141 (low).  ESR was 64.  Thrombocytopenia has resolved.  He has a history of hypomagnesemia that required IV magnesium (2-4 gm) weekly.  He has not required magnesium since 10/24/2016.  Symptomatically, he is doing well.  He has a lingering cold.  Exam is stable. Hemoglobin is 12.4.  M-spike is 0.  Plan:  1. Labs today:  CBC with diff, CMP, Mg, SPEP, FLCA. 2. Multiple  myeloma - treatment ongoing  Clinically doing well.  M-spike 0.1 last month.    Call [facility] regarding upcoming 2 year transplant evaluation.    Patient will require further immunizations (pneumococcal PPV23 or Pneumovax, MMR) during 24 month follow up visit at [facility]   Labs reviewed.  Blood counts stable and adequate for treatment.  Proceed with cycle #19 daratumumab.  Patient has taken his premedications prior to clinic today.   Discuss symptom management.  He has antiemetics and pain medications at home to use on a prn bases.  Interventions are adequate.    3. Atrial fibrillation -  stable.  Continue Eliquis 5 mg BID. 4.    RTC in 4 weeks for MD assessment, labs (CBC with diff, CMP, SPEP), and cycle #20 daratumumab.   Lequita Asal 06/13/18, 10:43 AM

## 2018-06-14 ENCOUNTER — Other Ambulatory Visit: Payer: Self-pay | Admitting: Urgent Care

## 2018-06-14 LAB — KAPPA/LAMBDA LIGHT CHAINS
Kappa free light chain: 9.6 mg/L (ref 3.3–19.4)
Kappa, lambda light chain ratio: 1.55 (ref 0.26–1.65)
Lambda free light chains: 6.2 mg/L (ref 5.7–26.3)

## 2018-06-15 LAB — PROTEIN ELECTROPHORESIS, SERUM
A/G Ratio: 1.9 — ABNORMAL HIGH (ref 0.7–1.7)
Albumin ELP: 3.7 g/dL (ref 2.9–4.4)
Alpha-1-Globulin: 0.1 g/dL (ref 0.0–0.4)
Alpha-2-Globulin: 0.8 g/dL (ref 0.4–1.0)
Beta Globulin: 0.8 g/dL (ref 0.7–1.3)
Gamma Globulin: 0.3 g/dL — ABNORMAL LOW (ref 0.4–1.8)
Globulin, Total: 2 g/dL — ABNORMAL LOW (ref 2.2–3.9)
Total Protein ELP: 5.7 g/dL — ABNORMAL LOW (ref 6.0–8.5)

## 2018-06-17 ENCOUNTER — Telehealth: Payer: Self-pay | Admitting: *Deleted

## 2018-06-17 NOTE — Telephone Encounter (Signed)
-----   Message from Lequita Asal, MD sent at 06/15/2018  1:08 PM EDT ----- Regarding: Please call patient  M-spike is 0.  M  ----- Message ----- From: Buel Ream, [facility] [place] Sent: 06/13/2018   9:51 AM EDT To: Lequita Asal, MD

## 2018-06-17 NOTE — Telephone Encounter (Signed)
Called patient to inform him that his M-spike is  0.  Patient very pleased with result and thanked me for call.

## 2018-06-24 ENCOUNTER — Other Ambulatory Visit: Payer: Self-pay | Admitting: Urology

## 2018-07-09 ENCOUNTER — Other Ambulatory Visit: Payer: Self-pay | Admitting: Hematology and Oncology

## 2018-07-09 ENCOUNTER — Other Ambulatory Visit: Payer: Self-pay | Admitting: Urgent Care

## 2018-07-09 DIAGNOSIS — C9001 Multiple myeloma in remission: Secondary | ICD-10-CM

## 2018-07-10 ENCOUNTER — Other Ambulatory Visit: Payer: Self-pay | Admitting: Hematology and Oncology

## 2018-07-11 ENCOUNTER — Inpatient Hospital Stay: Payer: Medicare Other

## 2018-07-11 ENCOUNTER — Inpatient Hospital Stay: Payer: Medicare Other | Attending: Hematology and Oncology

## 2018-07-11 ENCOUNTER — Encounter: Payer: Self-pay | Admitting: Hematology and Oncology

## 2018-07-11 ENCOUNTER — Inpatient Hospital Stay (HOSPITAL_BASED_OUTPATIENT_CLINIC_OR_DEPARTMENT_OTHER): Payer: Medicare Other | Admitting: Hematology and Oncology

## 2018-07-11 VITALS — BP 128/79 | HR 97 | Temp 98.1°F | Resp 18 | Wt 167.2 lb

## 2018-07-11 DIAGNOSIS — C9000 Multiple myeloma not having achieved remission: Secondary | ICD-10-CM | POA: Diagnosis not present

## 2018-07-11 DIAGNOSIS — K529 Noninfective gastroenteritis and colitis, unspecified: Secondary | ICD-10-CM

## 2018-07-11 DIAGNOSIS — Z5112 Encounter for antineoplastic immunotherapy: Secondary | ICD-10-CM

## 2018-07-11 DIAGNOSIS — I4891 Unspecified atrial fibrillation: Secondary | ICD-10-CM

## 2018-07-11 DIAGNOSIS — Z9484 Stem cells transplant status: Secondary | ICD-10-CM | POA: Diagnosis not present

## 2018-07-11 DIAGNOSIS — C9001 Multiple myeloma in remission: Secondary | ICD-10-CM

## 2018-07-11 DIAGNOSIS — C8522 Mediastinal (thymic) large B-cell lymphoma, intrathoracic lymph nodes: Secondary | ICD-10-CM | POA: Diagnosis present

## 2018-07-11 DIAGNOSIS — C9002 Multiple myeloma in relapse: Secondary | ICD-10-CM

## 2018-07-11 DIAGNOSIS — Z7901 Long term (current) use of anticoagulants: Secondary | ICD-10-CM

## 2018-07-11 LAB — CBC WITH DIFFERENTIAL/PLATELET
Basophils Absolute: 0 10*3/uL (ref 0–0.1)
Basophils Relative: 1 %
Eosinophils Absolute: 0.1 10*3/uL (ref 0–0.7)
Eosinophils Relative: 1 %
HCT: 37.7 % — ABNORMAL LOW (ref 40.0–52.0)
Hemoglobin: 13.1 g/dL (ref 13.0–18.0)
Lymphocytes Relative: 24 %
Lymphs Abs: 1.7 10*3/uL (ref 1.0–3.6)
MCH: 36.2 pg — ABNORMAL HIGH (ref 26.0–34.0)
MCHC: 34.7 g/dL (ref 32.0–36.0)
MCV: 104.3 fL — ABNORMAL HIGH (ref 80.0–100.0)
Monocytes Absolute: 0.3 10*3/uL (ref 0.2–1.0)
Monocytes Relative: 5 %
Neutro Abs: 4.9 10*3/uL (ref 1.4–6.5)
Neutrophils Relative %: 69 %
Platelets: 193 10*3/uL (ref 150–440)
RBC: 3.62 MIL/uL — ABNORMAL LOW (ref 4.40–5.90)
RDW: 13.9 % (ref 11.5–14.5)
WBC: 7.1 10*3/uL (ref 3.8–10.6)

## 2018-07-11 LAB — COMPREHENSIVE METABOLIC PANEL
ALT: 17 U/L (ref 0–44)
AST: 24 U/L (ref 15–41)
Albumin: 4.2 g/dL (ref 3.5–5.0)
Alkaline Phosphatase: 41 U/L (ref 38–126)
Anion gap: 8 (ref 5–15)
BUN: 17 mg/dL (ref 8–23)
CO2: 23 mmol/L (ref 22–32)
Calcium: 9.5 mg/dL (ref 8.9–10.3)
Chloride: 108 mmol/L (ref 98–111)
Creatinine, Ser: 1.44 mg/dL — ABNORMAL HIGH (ref 0.61–1.24)
GFR calc Af Amer: 55 mL/min — ABNORMAL LOW (ref >60)
GFR calc non Af Amer: 47 mL/min — ABNORMAL LOW (ref >60)
Glucose, Bld: 160 mg/dL — ABNORMAL HIGH (ref 70–99)
Potassium: 4.1 mmol/L (ref 3.5–5.1)
Sodium: 139 mmol/L (ref 135–145)
Total Bilirubin: 0.6 mg/dL (ref 0.3–1.2)
Total Protein: 6.5 g/dL (ref 6.5–8.1)

## 2018-07-11 LAB — MAGNESIUM: Magnesium: 2 mg/dL (ref 1.7–2.4)

## 2018-07-11 MED ORDER — HEPARIN SOD (PORK) LOCK FLUSH 100 UNIT/ML IV SOLN: 500.0000 [IU] | Freq: Once | INTRAVENOUS | Status: DC | PRN

## 2018-07-11 MED ORDER — SODIUM CHLORIDE 0.9 % IV SOLN
Freq: Once | INTRAVENOUS | Status: AC
  Administered 2018-07-11: 11:00:00 via INTRAVENOUS
  Filled 2018-07-11: qty 250

## 2018-07-11 MED ORDER — HEPARIN SOD (PORK) LOCK FLUSH 100 UNIT/ML IV SOLN
500.0000 [IU] | Freq: Once | INTRAVENOUS | Status: AC
  Administered 2018-07-11: 500 [IU] via INTRAVENOUS
  Filled 2018-07-11: qty 5

## 2018-07-11 MED ORDER — SODIUM CHLORIDE 0.9 % IV SOLN: 16.0000 mg/kg | Freq: Once | INTRAVENOUS | Status: DC

## 2018-07-11 MED ORDER — SODIUM CHLORIDE 0.9% FLUSH
10.0000 mL | Freq: Once | INTRAVENOUS | Status: AC
  Administered 2018-07-11: 10 mL via INTRAVENOUS
  Filled 2018-07-11: qty 10

## 2018-07-11 MED ORDER — SODIUM CHLORIDE 0.9 % IV SOLN
1200.0000 mg | Freq: Once | INTRAVENOUS | Status: AC
  Administered 2018-07-11: 1200 mg via INTRAVENOUS
  Filled 2018-07-11: qty 60

## 2018-07-11 NOTE — Progress Notes (Signed)
Pt in for follow up and treatment.  Has intrathecal pump for pain.  Reports "diarrhea, taking imodium".

## 2018-07-11 NOTE — Progress Notes (Signed)
[facility] day:  07/11/18  Chief Complaint: Logan Perez is a 72 y.o. 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   HPI: The patient was last seen in the medical oncology clinic on 06/13/2018.  At that time, he was doing well.  He noted a lingering cold x 2 weeks.  He denied any fever or cough.  CBC revealed a hematocrit of 36.0, hemoglobin 12.4, MCV 104.6, platelets 207,000, WBC 5800 with an ANC of 3700.  SPEP revealed no monoclonal protein.  Free light chain ratio was 1.55 (normal).  He received cycle #19 daratumumab.  During the interim, patient has been doing well overall. He notes "tempermental bowels". Patient continues to have frequent loose stools, requiring daily use of PRN loperamide. He is "chugging a lot" of Kaopectate (bismuth). Patient denies that he has experienced any B symptoms. He denies any interval infections. 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 He notes that his neuropathy does not impose any functional limitations, nor does it impede his ability to ambulate safely.  Patient advises that he maintains an adequate appetite. He is eating well. Weight today is 167 lb 4 oz (75.9 kg), which compared to his last visit to the clinic, represents a 2 pound loss.    Patient denies pain in the clinic today.   Past Medical History:  Diagnosis Date  . Anxiety   . Atrial fibrillation ([place])   . BPH (benign prostatic hyperplasia)   . Complication of anesthesia    BAD HEADACHE NIGHT OF FIRST CATARACT  . Compression fracture of lumbar vertebra ([place])   . Difficulty voiding   . Dysrhythmia    A FIB  . Elevated PSA   . GERD (gastroesophageal reflux disease)   . Hearing aid worn    bilateral  . History of kidney stones   . HLD (hyperlipidemia)   . HOH (hard  of hearing)   . Hypertension   . Multiple myeloma ([place])   . Neuropathy    feet. 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  . Pain    BACK  . Palpitations   . Pneumonia   . Pulmonary embolism ([place])   . Sepsis ([place])   . Stroke [facility])    TIA, detected on CT scan. pt was unaware    Past Surgical History:  Procedure Laterality Date  . BACK SURGERY  1994  . CATARACT EXTRACTION W/PHACO Right 05/02/2016   Procedure: CATARACT EXTRACTION PHACO AND INTRAOCULAR LENS PLACEMENT (IOC);  Surgeon: Birder Robson, MD;  Location: [facility] OR[facility];  Service: Ophthalmology;  Laterality: Right;  [place] 1.06 AP% 20.6 CDE 13.70 FLUID PACK LOT # [id number]  . CATARACT EXTRACTION W/PHACO Left 05/16/2016   Procedure: CATARACT EXTRACTION PHACO AND INTRAOCULAR LENS PLACEMENT (IOC);  Surgeon: Birder Robson, MD;  Location: [facility];  Service: Ophthalmology;  Laterality: Left;  [place] 01:43 AP% 19.8 CDE 20.45 FLUID PACK LOT #[id number] H  . COLONOSCOPY WITH PROPOFOL N/A 03/22/2017   Procedure: COLONOSCOPY WITH PROPOFOL;  Surgeon: Lucilla Lame, MD;  Location: [place];  Service: Endoscopy;  Laterality: N/A;  has port  . ESOPHAGOGASTRODUODENOSCOPY (EGD) WITH PROPOFOL  03/22/2017   Procedure: ESOPHAGOGASTRODUODENOSCOPY (EGD) WITH PROPOFOL;  Surgeon: Lucilla Lame, MD;  Location: [place];  Service: Endoscopy;;  . EYE SURGERY    . KNEE ARTHROSCOPY Left 1992  .  LIMBAL STEM CELL TRANSPLANT    . PAIN PUMP IMPLANTATION  2012  . PAIN PUMP IMPLANTATION N/A 09/04/2017   Procedure: INTRATHECAL PUMP BATTERY CHANGE;  Surgeon: Milinda Pointer, MD;  Location: [facility];  Service: Neurosurgery;  Laterality: N/A;  . PORTA CATH INSERTION N/A 12/04/2016   Procedure: Glori Luis Cath Insertion;  Surgeon: Algernon Huxley, MD;  Location: [facility];  Service: Cardiovascular;  Laterality: N/A;  . stem cell implant  2008   [facility]    Family History  Problem Relation Age of Onset  . Cancer Father        throat  . Kidney disease Sister   . Stroke  Unknown   . Stroke Mother   . Bladder Cancer Neg Hx   . Prostate cancer Neg Hx   . Kidney cancer Neg Hx     Social History:  reports that he quit smoking about 42 years ago. His smoking use included cigarettes. He has a 30.00 pack-year smoking history. He has never used smokeless tobacco. He reports that he does not drink alcohol or use drugs.  He has a wire haired dachshund.  He lives in [place].  His wife's name is [name].  The patient is alone today.  Allergies:  Allergies  Allergen Reactions  . Azithromycin Diarrhea and Other (See Comments)    Possible cause of C. Diff Possible cause of C. Diff Possible cause of C. Diff Possible cause of C. Diff  . Bee Pollen Other (See Comments)    Sneezing, watery eyes, runny nose  . Pollen Extract Other (See Comments)    Sneezing, watery eyes, runny nose  . 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 Rivaroxaban Rash    Current Medications: Current Outpatient Medications  Medication Sig Dispense Refill  . acetaminophen (TYLENOL) 325 MG tablet Take 650 mg every 6 (six) hours as needed by mouth for mild pain or headache.     . ALPRAZolam (XANAX) 0.25 MG tablet TAKE 1 TABLET BY MOUTH EVERY NIGHT AT BEDTIME 30 tablet 0  . baclofen (LIORESAL) 10 MG tablet Take 1 tablet (10 mg total) by mouth 2 (two) times daily. 60 tablet 5  . Calcium Carb-Cholecalciferol (CALCIUM-VITAMIN D) 500-400 MG-UNIT TABS Take 1 tablet daily by mouth.    . cetirizine (ZYRTEC) 10 MG tablet Take 10 mg by mouth daily as needed for allergies.     . Cholecalciferol (VITAMIN D3) 2000 units capsule Take 2,000 Units daily by mouth.     . Daratumumab (DARZALEX IV) Inject 1,200 mg every 30 (thirty) days into the vein.     [name] dexamethasone (DECADRON) 4 MG tablet TAKE 4 TABLETS BY MOUTH 1 HOUR TO PRIOR TO INFUSION THEN TAKE 1 TABLET ON DAY 2 AND 3 30 tablet 0  . diltiazem (CARDIZEM CD) 120 MG 24 hr capsule Take 120 mg daily by  mouth.     . diphenhydrAMINE (BENADRYL) 25 MG tablet Take 25 mg by mouth as directed. With chemo treatment    . ELIQUIS 5 MG TABS tablet TAKE 1 TABLET(5 MG) BY MOUTH TWICE DAILY 60 tablet 0  . ENSURE (ENSURE) Take 237 mLs by mouth daily.    . fluticasone (FLONASE) 50 MCG/ACT nasal spray Place 1 spray into the nose daily as needed for allergies.     . Hypromellose (ARTIFICIAL TEARS OP) Place 1 drop as needed into both eyes (for dry eyes).    [name] loperamide (IMODIUM)  2 MG capsule Take 4 mg as needed by mouth for diarrhea or loose stools.     . lovastatin (MEVACOR) 20 MG tablet Take 20 mg by mouth every evening.     . montelukast (SINGULAIR) 10 MG tablet TAKE 1 TABLET BY MOUTH DAILY THE DAY BEFORE THE INFUSION, THEN DAY OF, AND THE 2 DAYS AFTER INFUSION 30 tablet 0  . Multiple Vitamins-Iron (MULTIVITAMIN/IRON PO) Take 1 tablet daily by mouth.    [name] omeprazole (PRILOSEC) 20 MG capsule Take 20 mg by mouth daily.     . ondansetron (ZOFRAN) 8 MG tablet Take 1 tablet (8 mg total) by mouth as directed. Take with chemo and can take it as needed 20 tablet 1  . potassium chloride SA (K-DUR,KLOR-CON) 20 MEQ tablet TAKE 1 TABLET BY MOUTH DAILY 90 tablet 0  . ranitidine (ZANTAC) 150 MG tablet Take 150 mg by mouth at bedtime.     . tamsulosin (FLOMAX) 0.4 MG CAPS capsule Take 1 capsule (0.4 mg total) by mouth daily. 90 capsule 4  . tamsulosin (FLOMAX) 0.4 MG CAPS capsule TAKE ONE CAPSULE BY MOUTH DAILY 90 capsule 0  . valACYclovir (VALTREX) 500 MG tablet TAKE 1 TABLET BY MOUTH DAILY 30 tablet 0  . vitamin B-12 (CYANOCOBALAMIN) 1000 MCG tablet Take 1,000 mcg daily by mouth.     . bismuth subsalicylate (PEPTO BISMOL) 262 MG/15ML suspension Take 30 mLs by mouth every 6 (six) hours as needed for diarrhea or loose stools.    [name] diltiazem (CARDIZEM) 60 MG tablet Take 60 mg daily as needed by mouth (for increased heart rate > 140).     [name] PAIN MANAGEMENT IT PUMP REFILL 1 each by Intrathecal route once for 1 dose.  Medication: PF Fentanyl 1,500.0 mcg/ml PF Bupivicaine 30.0 mg/ml PF Clonidine 300.0 mcg/ml Total Volume: 40 ml Needed by 06-12-18 @ 1000 1 each 0   No current facility-administered medications for this visit.    Facility-Administered Medications Ordered in Other Visits  Medication Dose Route Frequency Provider Last Rate Last Dose  . heparin lock flush 100 unit/mL  500 Units Intravenous Once Lequita Asal, MD        Review of Systems  Constitutional: Positive for weight loss (down 2 pounds). Negative for chills, diaphoresis, fever and malaise/fatigue.       Today is "a good day".  HENT: Positive for hearing loss (hearing aides). Negative for congestion, ear discharge, ear pain, nosebleeds, sinus pain and sore throat.   Eyes: Negative.  Negative for blurred vision, double vision, photophobia, pain, discharge and redness.  Respiratory: Negative.  Negative for hemoptysis, sputum production and shortness of breath.   Cardiovascular: Negative.  Negative for chest pain, palpitations, orthopnea, leg swelling and PND.       PMH (+) atrial fibrillation  Gastrointestinal: Positive for diarrhea (chronic - uses daily loperamide) and heartburn. Negative for abdominal pain, blood in stool, constipation, melena, nausea and vomiting.       Bowels more problematic.  Genitourinary: Negative for dysuria, frequency, hematuria and urgency.  Musculoskeletal: Positive for back pain (chronic. Implanted Baclofen pump). Negative for falls, joint pain and myalgias.  Skin: Negative.  Negative for itching and rash.  Neurological: Positive for sensory change (stable neuropathy in fingers and toes). Negative for dizziness, tremors, weakness and headaches.  Endo/Heme/Allergies: Does not bruise/bleed easily.  Psychiatric/Behavioral: Negative for depression, memory loss and suicidal ideas. The patient is not nervous/anxious and does not have insomnia.   All other systems reviewed and are negative.  Performance  status (ECOG): 1 - Symptomatic but completely ambulatory  Vital signs BP 128/79 (BP Location: Left Arm, Patient Position: Sitting)   Pulse 97   Temp 98.1 F (36.7 C) (Tympanic)   Resp 18   Wt 162 lb 4 oz (73.6 kg)   BMI 25.41 kg/m   Physical Exam  Constitutional: He is oriented to person, place, and time and well-developed, well-nourished, and in no distress.  HENT:  Head: Normocephalic and atraumatic.  Short brown hair.  Hearing aides.   Eyes: Pupils are equal, round, and reactive to light. Conjunctivae and EOM are normal. No scleral icterus.  Glasses.  Blue eyes s/p cataract surgery.   Neck: Normal range of motion. Neck supple. No JVD present. No thyromegaly present.  Cardiovascular: Normal rate, regular rhythm and normal heart sounds. Exam reveals no gallop and no friction rub.  No murmur heard. Pulmonary/Chest: Effort normal and breath sounds normal. No respiratory distress. He has no wheezes. He has no rales.  Abdominal: Soft. Bowel sounds are normal. He exhibits no distension and no mass. There is no tenderness. There is no rebound and no guarding.  Musculoskeletal: Normal range of motion. He exhibits no edema or tenderness.  Lymphadenopathy:    He has no cervical adenopathy.    He has no axillary adenopathy.       Right: No inguinal and no supraclavicular adenopathy present.       Left: No inguinal and no supraclavicular adenopathy present.  Neurological: He is alert and oriented to person, place, and time.  RUQ Baclofen pump.  Skin: Skin is warm and dry. No rash noted. No erythema.  Skin graft to tip of nose following Moh's surgery.  Psychiatric: Mood, affect and judgment normal.  Nursing note and vitals reviewed.    Infusion on 07/11/2018  Component Date Value Ref Range Status  . Magnesium 07/11/2018 2.0  1.7 - 2.4 mg/dL Final   Performed at [facility], [address] [place], [place] [address]  . Sodium 07/11/2018 139  135 - 145 mmol/L Final  . Potassium  07/11/2018 4.1  3.5 - 5.1 mmol/L Final  . Chloride 07/11/2018 108  98 - 111 mmol/L Final  . CO2 07/11/2018 23  22 - 32 mmol/L Final  . Glucose, Bld 07/11/2018 160* 70 - 99 mg/dL Final  . BUN 07/11/2018 17  8 - 23 mg/dL Final  . Creatinine, Ser 07/11/2018 1.44* 0.61 - 1.24 mg/dL Final  . Calcium 07/11/2018 9.5  8.9 - 10.3 mg/dL Final  . Total Protein 07/11/2018 6.5  6.5 - 8.1 g/dL Final  . Albumin 07/11/2018 4.2  3.5 - 5.0 g/dL Final  . AST 07/11/2018 24  15 - 41 U/L Final  . ALT 07/11/2018 17  0 - 44 U/L Final  . Alkaline Phosphatase 07/11/2018 41  38 - 126 U/L Final  . Total Bilirubin 07/11/2018 0.6  0.3 - 1.2 mg/dL Final  . GFR calc non Af Amer 07/11/2018 47* >60 mL/min Final  . GFR calc Af Amer 07/11/2018 55* >60 mL/min Final   Comment: (NOTE) The eGFR has been calculated using the CKD EPI equation. This calculation has not been validated in all clinical situations. eGFR's persistently <60 mL/min signify possible Chronic Kidney Disease.   Georgiann Hahn gap 07/11/2018 8  5 - 15 Final   Performed at [facility], [address] [place], [place] [address]  . WBC 07/11/2018 7.1  3.8 - 10.6 K/uL Final  . RBC 07/11/2018 3.62* 4.40 - 5.90  MIL/uL Final  . Hemoglobin 07/11/2018 13.1  13.0 - 18.0 g/dL Final  . HCT 07/11/2018 37.7* 40.0 - 52.0 % Final  . MCV 07/11/2018 104.3* 80.0 - 100.0 fL Final  . MCH 07/11/2018 36.2* 26.0 - 34.0 pg Final  . MCHC 07/11/2018 34.7  32.0 - 36.0 g/dL Final  . RDW 07/11/2018 13.9  11.5 - 14.5 % Final  . Platelets 07/11/2018 193  150 - 440 K/uL Final  . Neutrophils Relative % 07/11/2018 69  % Final  . Neutro Abs 07/11/2018 4.9  1.4 - 6.5 K/uL Final  . Lymphocytes Relative 07/11/2018 24  % Final  . Lymphs Abs 07/11/2018 1.7  1.0 - 3.6 K/uL Final  . Monocytes Relative 07/11/2018 5  % Final  . Monocytes Absolute 07/11/2018 0.3  0.2 - 1.0 K/uL Final  . Eosinophils Relative 07/11/2018 1  % Final  . Eosinophils Absolute 07/11/2018 0.1  0 - 0.7 K/uL Final  .  Basophils Relative 07/11/2018 1  % Final  . Basophils Absolute 07/11/2018 0.0  0 - 0.1 K/uL Final   Performed at [facility], [address] [place], [place] [address]    Assessment:  Jemal Miskell is a 72 y.o. male with stage III mutiple myeloma.  He initially presented with progressive back pain beginning in 12/2006.  MRI revealed "spots and compression fractures".  He began Velcade, thalidomide, and Decadron.  In 08/2007, he underwent high dose chemotherapy and autologous stem cell transplant.  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  He recurred with a rising M-spike (2.7) with repeat M spike (1.7 gm/dl) in 03/2010.  He was initially treated with Velcade (02/08/2010 - 05/10/2010).  He then began Revlimid (15 mg 3 weeks on/1 week off) and Decadron (40 mg on day 1, 8, 15, 22).  Because of significant side effect with Decadron his dose was decreased to 10 mg once a week in 07/2010.    He was on maintenance Revlimid. Revlimid was initially10 mg 3 weeks on/1 week off. This was changed to 10 mg 2 weeks on/2 weeks off secondary to right nipple tenderness. His dose was increased to 10 mg 3 weeks on/1 week off with Decadron 10 mg a week (on Sundays) and then Revlamid 15 mg 3 weeks on and 1 week off with Decadron on Sundays.  He began Pomalyst 4 mg 3 weeks on/1 week off with Decadron on 08/27/2015.  Over the past year his SPEP has revealed no monoclonal protein (04/21/2015) and 0.5 gm/dL on 09/22/2015 and 10/20/2015. M spike was 0.1 on 02/02/2016, 03/01/2016, 03/29/2016, 04/26/2016, and 05/24/2016.  M spike was 0 on 10/11/2016, 11/28/2016, 02/05/2017, 03/21/2017, 08/02/2017, 10/04/2017, 12/27/2017, 01/24/2018, and 02/21/2018.  M spike was 0.1 on 11/01/2017, 0.1 on 11/29/2017, 0 on 12/27/2017, 0 on 01/24/2018, 0 on 02/21/2018, 0.1 on 03/21/2018, 0.3 on 04/18/2018, 0.1 on 05/16/2018, and 0 on 06/13/2018.  Free light chains have been monitored. Kappa free light  chains were 18.54 on 11/21/2013, 18.37 on 02/20/2014, 18.93 on 05/22/2014, 32.58 (high; normal ratio 1.27) on 08/21/2014, 51.53 (high; elevated ratio of 2.12) on 11/13/2014, 28.08 (ratio 1.73) on 12/09/2014, 23.71 (ratio 2.17) on 01/18/2015, 92.93 (ratio 9.49) on 04/21/2015, 93.44 (ratio 10.28) on 05/26/2015, 255.45 (ratio of 24.05) on 07/14/2015, 373.89 (ratio 48.31) on 08/04/2015, 474.33 (ratio 70.58) on 08/25/2015, 450.76 (ratio 55.44) on 09/22/2015, 453.4 (ratio 59.89) on 10/20/2015, 58.26 (ratio of > 40.74) on 02/02/2016, 62.67 (ratio of 37.98) on 03/01/2016, 75.7 (ratio > 50.47) on 03/29/2016, 79.7 (ratio 53.13) on  04/26/2016, 108.6 (ratio > 72.4) on 05/24/2016, 8.3 (1.46 ratio) on 10/11/2016, 6.7 (0.81 ratio) on 02/05/2017, 7.8 (1.01 ratio) on 03/21/2017,7.6 (ratio 0.63) on 06/01/2017, 8.1 (ratio 1.13) on 11/29/2017, and 9.6 (ratio 1.55) on 06/13/2018.  Bone survey on 12/08/2014 was stable.  Bone survey on 10/21/2015 revealed increase conspicuity of subcentimeter lytic lesions in the calvarium.  Bone marrow aspirate and biopsy on 11/04/2015 revealed an atypical monoclonal plasma cells estimated at 30-40% of marrow cells.   Marrow was variably cellular (approximately 45%) with background trilineage hematopoiesis. There was no significant increase in marrow reticulin fibers. Storage iron was present.    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 He develoed herpes zoster in 04/2008. He developed a pulmonary embolism in 05/2013. He was initially on Xarelto, but is now on Eliquis. He had an episode of pneumonia around this time requiring a brief admission. He developed severe lower leg cramps on 08/07/2014 secondary to hypokalemia. Duplex was negative.   He was treated for C difficile colitis (Flagyl completed 07/30/2015).  He has a chronic indwelling pain pump.  He received 4 cycles of Pomalyst and Decadron (08/27/2015 - 11/19/2015).   Restaging studies document progressive disease.  Kappa free light chains are increasing.  SPEP revealed 0.5 gm/dL monoclonal protein then 1.3 gm/dL.  Bone survey reveals increase conspicuity of subcentimeter lytic lesions in the calvarium.  Bone marrow reveals 30-40% plasma cells.   MUGA on 11/30/2015 revealed an ejection fraction of 46%.  He is felt not to be a good candidate for Kyprolis.  There were no focal wall motion abnormalities. He had a stress echo less than 1 year ago.  He has a history of PVCs and atrial fibrillation.  He takes Cardizem prn.  He received 17 weeks of daratumumab (Darzalex) (12/09/2015 - 05/25/2016).  He tolerated treatment well without side effect.  Bone marrow on 02/09/2016 revealed no diagnostic morphologic evidence of plasma cell myeloma.  Marrow was normocellular to hypocellular marrow for age (ranging from 10-40%) with maturing trilineage hematopoiesis and mild multilineage dyspoiesis.  There was patchy mild increase in reticulin.  Storage iron was present.  Flow cytometry revealed no definitive evidence of monoclonality.  There was a non-specific atypical myeloid and monocytic findings with no increase in blasts.  Cytogenetics were normal (46, XY).  He is currently 25 months s/p 2nd autologous stem cell transplant on 06/16/2016.  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  He also experienced atrial fibrillation with intermittent episodes of RVR requiring IV beta blockers.  He is on prophylactic valacyclovir for 1 year post transplant.  He started vaccinations (DTaP-Pediatric triple vaccine, Hep B- Pediatrix triple vaccine, Haemophilus influenza B (Hib), inactivated polio virus (IPV), pneumococcal conjugate vaccine 13-valent (PCV 13)) on 04/17/2017.  Recommendation was for Shingrix vaccine to be given in [place] and second dose of Shingrix in 2 months.  He had follow-up vaccinations on 08/28/2017.  He had his second  shingles vaccine. He received 18 month vaccinations - DTaP-Pediatric triple vaccine, Hep B- Pediatrix triple vaccine, Haemophilus influenza B (Hib), inactivated polio virus (IPV), pneumococcal conjugate vaccine 13-valent (PCV 13) on 02/05/2018.  He is s/p 19 cycles of Daratumumab (Darzalex) post transplant (12/05/2016 - 06/13/2018).  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  Work-up on 08/23/2016 revealed the following normal studies:  B12 and folate.  Ferritin was 1379 (high).  Iron studies included a saturation of 56% and TIBC  141 (low).  ESR was 64.  Thrombocytopenia has resolved.  He has a history of hypomagnesemia that required IV magnesium (2-4 gm) weekly.  He has not required magnesium since 10/24/2016.  Symptomatically, patient is doing well overall. He has chronically loose stools managed with daily PRN loperamide. Neuropathy in hands and feet is stable. Exam is stable.  BUN 17 and creatinine 1.44 (CrCl 43.4 mL/min).  Kappa lambda light chain ratio 2.06 (previously 1.55).  Plan:  1. Labs today:  CBC with diff, CMP, Mg, SPEP, FLCA. 2. Multiple myeloma  Doing well.  No significant symptoms.  SPEP inadvertently not drawn today.  Kappa lambda free light chain ratio elevated at 2.06 (previously 1.55).  Labs reviewed. Blood counts stable and adequate enough for treatment. Will proceed with cycle #20 daratumumab.  Premedications (ondansetron 8 mg, APAP 650 mg, and diphenhydramine 50 mg) taken prior to arrival. Discuss symptom management.  Patient has antiemetics and pain medications at home to use on a PRN basis. Patient  advising that the  prescribed interventions are adequate at this point. Continue all medications as previously prescribed.  3. Diarrhea and reflux  Diarrhea chronic despite daily loperamide PRN.  Increasing frequency of reflux.   "Chugging a lot of Kaopectate".   Discuss need to follow up with GI Allen Norris, MD).  4. Atrial  fibrillation  Stable.  Rate controlled with no recent RVR episodes.  Continues on chronic anticoagulation (apixaban 5 mg twice daily) therapy.  No increased bruising or bleeding. 5. RTC in 4 weeks for MD assessment, labs (CBC with diff, CMP, Mg, SPEP, FLCA), and cycle #21 daratumumab.    Honor Loh 07/11/18, 10:09 AM   I saw and evaluated the patient, participating in the key portions of the service and reviewing pertinent diagnostic studies and records.  I reviewed the nurse practitioner's note and agree with the findings and the plan.  The assessment and plan were discussed with the patient.  Multiple questions were asked by the patient and answered.   Nolon Stalls, MD 07/11/2018,10:09 AM

## 2018-07-12 LAB — KAPPA/LAMBDA LIGHT CHAINS
Kappa free light chain: 6.6 mg/L (ref 3.3–19.4)
Kappa, lambda light chain ratio: 2.06 — ABNORMAL HIGH (ref 0.26–1.65)
Lambda free light chains: 3.2 mg/L — ABNORMAL LOW (ref 5.7–26.3)

## 2018-07-14 ENCOUNTER — Encounter: Payer: Self-pay | Admitting: Hematology and Oncology

## 2018-07-14 DIAGNOSIS — K529 Noninfective gastroenteritis and colitis, unspecified: Secondary | ICD-10-CM | POA: Insufficient documentation

## 2018-07-15 ENCOUNTER — Other Ambulatory Visit: Payer: Self-pay | Admitting: Hematology and Oncology

## 2018-07-15 NOTE — Telephone Encounter (Signed)
Patient informed medicine is discontinued

## 2018-07-15 NOTE — Telephone Encounter (Signed)
I spoke with Dr. Mike Gip. He can stop this medication at this time.

## 2018-07-15 NOTE — Telephone Encounter (Signed)
He is currently 25 months s/p 2nd autologous stem cell transplant on 06/16/2016.  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  He also experienced atrial fibrillation with intermittent episodes of RVR requiring IV beta blockers.  He is on prophylactic valacyclovir for 1 year post transplant.  Does this need to be stopped?

## 2018-08-06 ENCOUNTER — Other Ambulatory Visit: Payer: Self-pay | Admitting: Urgent Care

## 2018-08-06 DIAGNOSIS — C9001 Multiple myeloma in remission: Secondary | ICD-10-CM

## 2018-08-08 ENCOUNTER — Encounter: Payer: Self-pay | Admitting: Hematology and Oncology

## 2018-08-08 ENCOUNTER — Inpatient Hospital Stay: Payer: Medicare Other

## 2018-08-08 ENCOUNTER — Inpatient Hospital Stay (HOSPITAL_BASED_OUTPATIENT_CLINIC_OR_DEPARTMENT_OTHER): Payer: Medicare Other | Admitting: Hematology and Oncology

## 2018-08-08 ENCOUNTER — Other Ambulatory Visit: Payer: Self-pay

## 2018-08-08 VITALS — BP 111/78 | HR 105 | Temp 96.0°F | Resp 18 | Wt 167.1 lb

## 2018-08-08 DIAGNOSIS — C9001 Multiple myeloma in remission: Secondary | ICD-10-CM

## 2018-08-08 DIAGNOSIS — Z9484 Stem cells transplant status: Secondary | ICD-10-CM

## 2018-08-08 DIAGNOSIS — Z5112 Encounter for antineoplastic immunotherapy: Secondary | ICD-10-CM | POA: Diagnosis not present

## 2018-08-08 DIAGNOSIS — R197 Diarrhea, unspecified: Secondary | ICD-10-CM

## 2018-08-08 DIAGNOSIS — K219 Gastro-esophageal reflux disease without esophagitis: Secondary | ICD-10-CM

## 2018-08-08 DIAGNOSIS — I4891 Unspecified atrial fibrillation: Secondary | ICD-10-CM

## 2018-08-08 DIAGNOSIS — C9002 Multiple myeloma in relapse: Secondary | ICD-10-CM

## 2018-08-08 DIAGNOSIS — Z79899 Other long term (current) drug therapy: Secondary | ICD-10-CM

## 2018-08-08 LAB — CBC WITH DIFFERENTIAL/PLATELET
Abs Immature Granulocytes: 0.02 10*3/uL (ref 0.00–0.07)
Basophils Absolute: 0 10*3/uL (ref 0.0–0.1)
Basophils Relative: 0 %
Eosinophils Absolute: 0.1 10*3/uL (ref 0.0–0.5)
Eosinophils Relative: 1 %
HCT: 35.5 % — ABNORMAL LOW (ref 39.0–52.0)
Hemoglobin: 12.2 g/dL — ABNORMAL LOW (ref 13.0–17.0)
Immature Granulocytes: 0 %
Lymphocytes Relative: 20 %
Lymphs Abs: 1.8 10*3/uL (ref 0.7–4.0)
MCH: 34.3 pg — ABNORMAL HIGH (ref 26.0–34.0)
MCHC: 34.4 g/dL (ref 30.0–36.0)
MCV: 99.7 fL (ref 80.0–100.0)
Monocytes Absolute: 0.6 10*3/uL (ref 0.1–1.0)
Monocytes Relative: 7 %
Neutro Abs: 6.4 10*3/uL (ref 1.7–7.7)
Neutrophils Relative %: 72 %
Platelets: 200 10*3/uL (ref 150–400)
RBC: 3.56 MIL/uL — ABNORMAL LOW (ref 4.22–5.81)
RDW: 12.6 % (ref 11.5–15.5)
WBC: 9 10*3/uL (ref 4.0–10.5)
nRBC: 0 % (ref 0.0–0.2)

## 2018-08-08 LAB — COMPREHENSIVE METABOLIC PANEL
ALT: 15 U/L (ref 0–44)
AST: 25 U/L (ref 15–41)
Albumin: 3.9 g/dL (ref 3.5–5.0)
Alkaline Phosphatase: 48 U/L (ref 38–126)
Anion gap: 7 (ref 5–15)
BUN: 15 mg/dL (ref 8–23)
CO2: 22 mmol/L (ref 22–32)
Calcium: 8.8 mg/dL — ABNORMAL LOW (ref 8.9–10.3)
Chloride: 108 mmol/L (ref 98–111)
Creatinine, Ser: 1.39 mg/dL — ABNORMAL HIGH (ref 0.61–1.24)
GFR calc Af Amer: 57 mL/min — ABNORMAL LOW (ref >60)
GFR calc non Af Amer: 49 mL/min — ABNORMAL LOW (ref >60)
Glucose, Bld: 149 mg/dL — ABNORMAL HIGH (ref 70–99)
Potassium: 3.6 mmol/L (ref 3.5–5.1)
Sodium: 137 mmol/L (ref 135–145)
Total Bilirubin: 0.8 mg/dL (ref 0.3–1.2)
Total Protein: 6.3 g/dL — ABNORMAL LOW (ref 6.5–8.1)

## 2018-08-08 LAB — MAGNESIUM: Magnesium: 1.8 mg/dL (ref 1.7–2.4)

## 2018-08-08 MED ORDER — HEPARIN SOD (PORK) LOCK FLUSH 100 UNIT/ML IV SOLN
500.0000 [IU] | Freq: Once | INTRAVENOUS | Status: AC | PRN
  Administered 2018-08-08: 500 [IU]
  Filled 2018-08-08: qty 5

## 2018-08-08 MED ORDER — SODIUM CHLORIDE 0.9 % IV SOLN
Freq: Once | INTRAVENOUS | Status: AC
  Administered 2018-08-08: 11:00:00 via INTRAVENOUS
  Filled 2018-08-08: qty 250

## 2018-08-08 MED ORDER — SODIUM CHLORIDE 0.9 % IV SOLN
1200.0000 mg | Freq: Once | INTRAVENOUS | Status: AC
  Administered 2018-08-08: 1200 mg via INTRAVENOUS
  Filled 2018-08-08: qty 60

## 2018-08-08 NOTE — Progress Notes (Signed)
[facility] day:  08/08/18  Chief Complaint: Logan Perez is a 72 y.o. 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   HPI: The patient was last seen in the medical oncology clinic on 07/11/2018.  At that time, he was doing well overall. He had chronically loose stools managed with daily PRN loperamide. Neuropathy in hands and feet was stable. Exam was stable.  BUN 17 and creatinine 1.44 (CrCl 43.4 mL/min).  Kappa lambda light chain ratio 2.06 (previously 1.55).  He received cycle #20 daratumumab.  During the interim, patient has been doing well overall. He continues to tolerate treatment with minimal side effects. No nausea or vomiting. Chronically loose stools persist, for which patient continues to use PRN loperamide. 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 He notes that his neuropathy does not impose any functional limitations, nor does it impede his ability to ambulate safely. Patient denies that he has experienced any B symptoms. He denies any interval infections.   Patient advises that he maintains an adequate appetite. He is eating well. Weight today is 167 lb 1.6 oz (75.8 kg), which compared to his last visit to the clinic, represents a stable weight.     Patient denies pain in the clinic today.    Past Medical History:  Diagnosis Date  . Anxiety   . Atrial fibrillation ([place])   . BPH (benign prostatic hyperplasia)   . Complication of anesthesia    BAD HEADACHE NIGHT OF FIRST CATARACT  . Compression fracture of lumbar vertebra ([place])   . Difficulty voiding   . Dysrhythmia    A FIB  . Elevated PSA   . GERD (gastroesophageal reflux disease)   . Hearing aid worn    bilateral  . History of kidney stones   . HLD (hyperlipidemia)   . HOH (hard of hearing)   .  Hypertension   . Multiple myeloma ([place])   . Neuropathy    feet. 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  . Pain    BACK  . Palpitations   . Pneumonia   . Pulmonary embolism ([place])   . Sepsis ([place])   . Stroke [facility])    TIA, detected on CT scan. pt was unaware    Past Surgical History:  Procedure Laterality Date  . BACK SURGERY  1994  . CATARACT EXTRACTION W/PHACO Right 05/02/2016   Procedure: CATARACT EXTRACTION PHACO AND INTRAOCULAR LENS PLACEMENT (IOC);  Surgeon: Birder Robson, MD;  Location: [facility] ORS;  Service: Ophthalmology;  Laterality: Right;  [place] 1.06 AP% 20.6 CDE 13.70 FLUID PACK LOT # [id number]  . CATARACT EXTRACTION W/PHACO Left 05/16/2016   Procedure: CATARACT EXTRACTION PHACO AND INTRAOCULAR LENS PLACEMENT (IOC);  Surgeon: Birder Robson, MD;  Location: [facility] ORS;  Service: Ophthalmology;  Laterality: Left;  [place] 01:43 AP% 19.8 CDE 20.45 FLUID PACK LOT #[id number] H  . COLONOSCOPY WITH PROPOFOL N/A 03/22/2017   Procedure: COLONOSCOPY WITH PROPOFOL;  Surgeon: Lucilla Lame, MD;  Location: [facility];  Service: Endoscopy;  Laterality: N/A;  has port  . ESOPHAGOGASTRODUODENOSCOPY (EGD) WITH PROPOFOL  03/22/2017   Procedure: ESOPHAGOGASTRODUODENOSCOPY (EGD) WITH PROPOFOL;  Surgeon: Lucilla Lame, MD;  Location: [facility];  Service: Endoscopy;;  . EYE SURGERY    . KNEE ARTHROSCOPY Left 1992  . LIMBAL STEM CELL TRANSPLANT    . PAIN  PUMP IMPLANTATION  2012  . PAIN PUMP IMPLANTATION N/A 09/04/2017   Procedure: INTRATHECAL PUMP BATTERY CHANGE;  Surgeon: Milinda Pointer, MD;  Location: [facility];  Service: Neurosurgery;  Laterality: N/A;  . PORTA CATH INSERTION N/A 12/04/2016   Procedure: Glori Luis Cath Insertion;  Surgeon: Algernon Huxley, MD;  Location: [facility];  Service: Cardiovascular;  Laterality: N/A;  . stem cell implant  2008   [facility]    Family History  Problem Relation Age of Onset  . Cancer Father        throat  . Kidney disease Sister   . Stroke Unknown   . Stroke  Mother   . Bladder Cancer Neg Hx   . Prostate cancer Neg Hx   . Kidney cancer Neg Hx     Social History:  reports that he quit smoking about 42 years ago. His smoking use included cigarettes. He has a 30.00 pack-year smoking history. He has never used smokeless tobacco. He reports that he does not drink alcohol or use drugs.  He has a wire haired dachshund.  He lives in [place].  His wife's name is [name].  The patient is alone today.  Allergies:  Allergies  Allergen Reactions  . Azithromycin Diarrhea and Other (See Comments)    Possible cause of C. Diff Possible cause of C. Diff Possible cause of C. Diff Possible cause of C. Diff  . Bee Pollen Other (See Comments)    Sneezing, watery eyes, runny nose  . Pollen Extract Other (See Comments)    Sneezing, watery eyes, runny nose  . 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 Rivaroxaban Rash    Current Medications: Current Outpatient Medications  Medication Sig Dispense Refill  . acetaminophen (TYLENOL) 325 MG tablet Take 650 mg every 6 (six) hours as needed by mouth for mild pain or headache.     . ALPRAZolam (XANAX) 0.25 MG tablet TAKE 1 TABLET BY MOUTH EVERY NIGHT AT BEDTIME 30 tablet 0  . baclofen (LIORESAL) 10 MG tablet Take 1 tablet (10 mg total) by mouth 2 (two) times daily. 60 tablet 5  . bismuth subsalicylate (PEPTO BISMOL) 262 MG/15ML suspension Take 30 mLs by mouth every 6 (six) hours as needed for diarrhea or loose stools.    . Calcium Carb-Cholecalciferol (CALCIUM-VITAMIN D) 500-400 MG-UNIT TABS Take 1 tablet daily by mouth.    . cetirizine (ZYRTEC) 10 MG tablet Take 10 mg by mouth daily as needed for allergies.     . Cholecalciferol (VITAMIN D3) 2000 units capsule Take 2,000 Units daily by mouth.     . Daratumumab (DARZALEX IV) Inject 1,200 mg every 30 (thirty) days into the vein.     [name] dexamethasone (DECADRON) 4 MG tablet TAKE 4 TABLETS BY MOUTH 1 HOUR TO PRIOR TO  INFUSION THEN TAKE 1 TABLET ON DAY 2 AND 3 30 tablet 0  . diltiazem (CARDIZEM CD) 120 MG 24 hr capsule Take 120 mg daily by mouth.     . diltiazem (CARDIZEM) 60 MG tablet Take 60 mg daily as needed by mouth (for increased heart rate > 140).     [name] diphenhydrAMINE (BENADRYL) 25 MG tablet Take 25 mg by mouth as directed. With chemo treatment    . ELIQUIS 5 MG TABS tablet TAKE 1 TABLET BY MOUTH TWICE DAILY 60 tablet 0  . ENSURE (ENSURE) Take 237 mLs by mouth daily.    . fluticasone (FLONASE)  50 MCG/ACT nasal spray Place 1 spray into the nose daily as needed for allergies.     . Hypromellose (ARTIFICIAL TEARS OP) Place 1 drop as needed into both eyes (for dry eyes).    [name] loperamide (IMODIUM) 2 MG capsule Take 4 mg as needed by mouth for diarrhea or loose stools.     . lovastatin (MEVACOR) 20 MG tablet Take 20 mg by mouth every evening.     . montelukast (SINGULAIR) 10 MG tablet TAKE 1 TABLET BY MOUTH DAILY THE DAY BEFORE THE INFUSION, THEN DAY OF AND THE 2 DAYS AFTER INFUSION 30 tablet 0  . Multiple Vitamins-Iron (MULTIVITAMIN/IRON PO) Take 1 tablet daily by mouth.    [name] omeprazole (PRILOSEC) 20 MG capsule Take 20 mg by mouth daily.     . ondansetron (ZOFRAN) 8 MG tablet Take 1 tablet (8 mg total) by mouth as directed. Take with chemo and can take it as needed 20 tablet 1  . potassium chloride SA (K-DUR,KLOR-CON) 20 MEQ tablet TAKE 1 TABLET BY MOUTH DAILY 90 tablet 0  . ranitidine (ZANTAC) 150 MG tablet Take 150 mg by mouth at bedtime.     . tamsulosin (FLOMAX) 0.4 MG CAPS capsule Take 1 capsule (0.4 mg total) by mouth daily. 90 capsule 4  . tamsulosin (FLOMAX) 0.4 MG CAPS capsule TAKE ONE CAPSULE BY MOUTH DAILY 90 capsule 0  . vitamin B-12 (CYANOCOBALAMIN) 1000 MCG tablet Take 1,000 mcg daily by mouth.     [name] PAIN MANAGEMENT IT PUMP REFILL 1 each by Intrathecal route once for 1 dose. Medication: PF Fentanyl 1,500.0 mcg/ml PF Bupivicaine 30.0 mg/ml PF Clonidine 300.0 mcg/ml Total Volume: 40  ml Needed by 06-12-18 @ 1000 1 each 0  . valACYclovir (VALTREX) 500 MG tablet TAKE 1 TABLET BY MOUTH DAILY (Patient not taking: Reported on 08/08/2018) 30 tablet 0   No current facility-administered medications for this visit.     Review of Systems  Constitutional: Negative for diaphoresis, fever, malaise/fatigue and weight loss (stable).  HENT: Positive for hearing loss (hearing aides).   Eyes: Negative.   Respiratory: Negative for cough, hemoptysis, sputum production and shortness of breath.   Cardiovascular: Negative for chest pain, palpitations, orthopnea, leg swelling and PND.       PMH (+) for atrial fibrillation  Gastrointestinal: Positive for diarrhea (chronic - uses daily loperamide) and heartburn. Negative for abdominal pain, blood in stool, constipation, melena, nausea and vomiting.  Genitourinary: Negative for dysuria, frequency, hematuria and urgency.  Musculoskeletal: Positive for back pain (chronic; has implanted Baclofen pump). Negative for falls, joint pain and myalgias.  Skin: Negative for itching and rash.  Neurological: Positive for sensory change (stable neuropathy to fingers and toes). Negative for dizziness, tremors, weakness and headaches.  Endo/Heme/Allergies: Does not bruise/bleed easily.  Psychiatric/Behavioral: Negative for depression, memory loss and suicidal ideas. The patient is not nervous/anxious and does not have insomnia.   All other systems reviewed and are negative.  Performance status (ECOG): 1 - Symptomatic but completely ambulatory  Vital signs BP 111/78 (BP Location: Left Arm)   Pulse (!) 105   Temp (!) 96 F (35.6 C) (Tympanic)   Resp 18   Wt 167 lb 1.6 oz (75.8 kg)   BMI 26.17 kg/m   Physical Exam  Constitutional: He is oriented to person, place, and time and well-developed, well-nourished, and in no distress.  HENT:  Head: Normocephalic and atraumatic.  Mouth/Throat: Mucous membranes are normal. No oropharyngeal exudate.  Brown hair.   Graying  goatee.  Eyes: Pupils are equal, round, and reactive to light. Conjunctivae and EOM are normal. No scleral icterus.  Glasses.  Neck: Normal range of motion. Neck supple. No tracheal deviation present. No thyromegaly present.  Cardiovascular: Regular rhythm, normal heart sounds and intact distal pulses. Tachycardia present. Exam reveals no gallop and no friction rub.  No murmur heard. Pulmonary/Chest: Effort normal and breath sounds normal. No respiratory distress. He has no wheezes. He has no rales.  Abdominal: Soft. Bowel sounds are normal. He exhibits no distension and no mass. There is no abdominal tenderness. There is no rebound and no guarding.  Musculoskeletal: Normal range of motion. He exhibits no edema or tenderness.  Implanted Baclofen pump  Lymphadenopathy:    He has no cervical adenopathy.    He has no axillary adenopathy.       Right: No inguinal and no supraclavicular adenopathy present.       Left: No inguinal and no supraclavicular adenopathy present.  Neurological: He is alert and oriented to person, place, and time.  Skin: Skin is warm and dry. No rash noted. No erythema.  Skin graft to tip of nose following Moh's surgery  Psychiatric: Mood, affect and judgment normal.  Nursing note and vitals reviewed.    Appointment on 08/08/2018  Component Date Value Ref Range Status  . Magnesium 08/08/2018 1.8  1.7 - 2.4 mg/dL Final   Performed at [facility], [address] [place], [place] [address]  . Sodium 08/08/2018 137  135 - 145 mmol/L Final  . Potassium 08/08/2018 3.6  3.5 - 5.1 mmol/L Final  . Chloride 08/08/2018 108  98 - 111 mmol/L Final  . CO2 08/08/2018 22  22 - 32 mmol/L Final  . Glucose, Bld 08/08/2018 149* 70 - 99 mg/dL Final  . BUN 08/08/2018 15  8 - 23 mg/dL Final  . Creatinine, Ser 08/08/2018 1.39* 0.61 - 1.24 mg/dL Final  . Calcium 08/08/2018 8.8* 8.9 - 10.3 mg/dL Final  . Total Protein 08/08/2018 6.3* 6.5 - 8.1 g/dL Final  . Albumin  08/08/2018 3.9  3.5 - 5.0 g/dL Final  . AST 08/08/2018 25  15 - 41 U/L Final  . ALT 08/08/2018 15  0 - 44 U/L Final  . Alkaline Phosphatase 08/08/2018 48  38 - 126 U/L Final  . Total Bilirubin 08/08/2018 0.8  0.3 - 1.2 mg/dL Final  . GFR calc non Af Amer 08/08/2018 49* >60 mL/min Final  . GFR calc Af Amer 08/08/2018 57* >60 mL/min Final   Comment: (NOTE) The eGFR has been calculated using the CKD EPI equation. This calculation has not been validated in all clinical situations. eGFR's persistently <60 mL/min signify possible Chronic Kidney Disease.   Georgiann Hahn gap 08/08/2018 7  5 - 15 Final   Performed at [facility], [address] [place], [place] [address]  . WBC 08/08/2018 9.0  4.0 - 10.5 K/uL Final  . RBC 08/08/2018 3.56* 4.22 - 5.81 MIL/uL Final  . Hemoglobin 08/08/2018 12.2* 13.0 - 17.0 g/dL Final  . HCT 08/08/2018 35.5* 39.0 - 52.0 % Final  . MCV 08/08/2018 99.7  80.0 - 100.0 fL Final  . MCH 08/08/2018 34.3* 26.0 - 34.0 pg Final  . MCHC 08/08/2018 34.4  30.0 - 36.0 g/dL Final  . RDW 08/08/2018 12.6  11.5 - 15.5 % Final  . Platelets 08/08/2018 200  150 - 400 K/uL Final  . nRBC 08/08/2018 0.0  0.0 - 0.2 % Final  . Neutrophils Relative %  08/08/2018 72  % Final  . Neutro Abs 08/08/2018 6.4  1.7 - 7.7 K/uL Final  . Lymphocytes Relative 08/08/2018 20  % Final  . Lymphs Abs 08/08/2018 1.8  0.7 - 4.0 K/uL Final  . Monocytes Relative 08/08/2018 7  % Final  . Monocytes Absolute 08/08/2018 0.6  0.1 - 1.0 K/uL Final  . Eosinophils Relative 08/08/2018 1  % Final  . Eosinophils Absolute 08/08/2018 0.1  0.0 - 0.5 K/uL Final  . Basophils Relative 08/08/2018 0  % Final  . Basophils Absolute 08/08/2018 0.0  0.0 - 0.1 K/uL Final  . Immature Granulocytes 08/08/2018 0  % Final  . Abs Immature Granulocytes 08/08/2018 0.02  0.00 - 0.07 K/uL Final   Performed at [facility], [address] [place], [place] [address]    Assessment:  Logan Perez is a 72 y.o. male with  stage III mutiple myeloma.  He initially presented with progressive back pain beginning in 12/2006.  MRI revealed "spots and compression fractures".  He began Velcade, thalidomide, and Decadron.  In 08/2007, he underwent high dose chemotherapy and autologous stem cell transplant.  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  He recurred with a rising M-spike (2.7) with repeat M spike (1.7 gm/dl) in 03/2010.  He was initially treated with Velcade (02/08/2010 - 05/10/2010).  He then began Revlimid (15 mg 3 weeks on/1 week off) and Decadron (40 mg on day 1, 8, 15, 22).  Because of significant side effect with Decadron his dose was decreased to 10 mg once a week in 07/2010.    He was on maintenance Revlimid. Revlimid was initially10 mg 3 weeks on/1 week off. This was changed to 10 mg 2 weeks on/2 weeks off secondary to right nipple tenderness. His dose was increased to 10 mg 3 weeks on/1 week off with Decadron 10 mg a week (on Sundays) and then Revlamid 15 mg 3 weeks on and 1 week off with Decadron on Sundays.  He began Pomalyst 4 mg 3 weeks on/1 week off with Decadron on 08/27/2015.  Over the past year his SPEP has revealed no monoclonal protein (04/21/2015) and 0.5 gm/dL on 09/22/2015 and 10/20/2015. M spike was 0.1 on 02/02/2016, 03/01/2016, 03/29/2016, 04/26/2016, and 05/24/2016.  M spike was 0 on 10/11/2016, 11/28/2016, 02/05/2017, 03/21/2017, 08/02/2017, 10/04/2017, 12/27/2017, 01/24/2018, and 02/21/2018.  M spike was 0.1 on 11/01/2017, 0.1 on 11/29/2017, 0 on 12/27/2017, 0 on 01/24/2018, 0 on 02/21/2018, 0.1 on 03/21/2018, 0.3 on 04/18/2018, 0.1 on 05/16/2018, and 0 on 06/13/2018.  Free light chains have been monitored. Kappa free light chains were 18.54 on 11/21/2013, 18.37 on 02/20/2014, 18.93 on 05/22/2014, 32.58 (high; normal ratio 1.27) on 08/21/2014, 51.53 (high; elevated ratio of 2.12) on 11/13/2014, 28.08 (ratio 1.73) on 12/09/2014, 23.71 (ratio 2.17) on 01/18/2015, 92.93  (ratio 9.49) on 04/21/2015, 93.44 (ratio 10.28) on 05/26/2015, 255.45 (ratio of 24.05) on 07/14/2015, 373.89 (ratio 48.31) on 08/04/2015, 474.33 (ratio 70.58) on 08/25/2015, 450.76 (ratio 55.44) on 09/22/2015, 453.4 (ratio 59.89) on 10/20/2015, 58.26 (ratio of > 40.74) on 02/02/2016, 62.67 (ratio of 37.98) on 03/01/2016, 75.7 (ratio > 50.47) on 03/29/2016, 79.7 (ratio 53.13) on 04/26/2016, 108.6 (ratio > 72.4) on 05/24/2016, 8.3 (1.46 ratio) on 10/11/2016, 6.7 (0.81 ratio) on 02/05/2017, 7.8 (1.01 ratio) on 03/21/2017,7.6 (ratio 0.63) on 06/01/2017, 8.1 (ratio 1.13) on 11/29/2017, and 9.6 (ratio 1.55) on 06/13/2018.  Bone survey on 12/08/2014 was stable.  Bone survey on 10/21/2015 revealed increase conspicuity of subcentimeter lytic lesions in the calvarium.  Bone marrow aspirate and biopsy on 11/04/2015 revealed an atypical monoclonal plasma cells estimated at 30-40% of marrow cells.   Marrow was variably cellular (approximately 45%) with background trilineage hematopoiesis. There was no significant increase in marrow reticulin fibers. Storage iron was present.    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 He develoed herpes zoster in 04/2008. He developed a pulmonary embolism in 05/2013. He was initially on Xarelto, but is now on Eliquis. He had an episode of pneumonia around this time requiring a brief admission. He developed severe lower leg cramps on 08/07/2014 secondary to hypokalemia. Duplex was negative.   He was treated for C difficile colitis (Flagyl completed 07/30/2015).  He has a chronic indwelling pain pump.  He received 4 cycles of Pomalyst and Decadron (08/27/2015 - 11/19/2015).  Restaging studies document progressive disease.  Kappa free light chains are increasing.  SPEP revealed 0.5 gm/dL monoclonal protein then 1.3 gm/dL.  Bone survey reveals increase conspicuity of subcentimeter lytic lesions in the calvarium.  Bone marrow  reveals 30-40% plasma cells.   MUGA on 11/30/2015 revealed an ejection fraction of 46%.  He is felt not to be a good candidate for Kyprolis.  There were no focal wall motion abnormalities. He had a stress echo less than 1 year ago.  He has a history of PVCs and atrial fibrillation.  He takes Cardizem prn.  He received 17 weeks of daratumumab (Darzalex) (12/09/2015 - 05/25/2016).  He tolerated treatment well without side effect.  Bone marrow on 02/09/2016 revealed no diagnostic morphologic evidence of plasma cell myeloma.  Marrow was normocellular to hypocellular marrow for age (ranging from 10-40%) with maturing trilineage hematopoiesis and mild multilineage dyspoiesis.  There was patchy mild increase in reticulin.  Storage iron was present.  Flow cytometry revealed no definitive evidence of monoclonality.  There was a non-specific atypical myeloid and monocytic findings with no increase in blasts.  Cytogenetics were normal (46, XY).  He is currently 26 months s/p 2nd autologous stem cell transplant on 06/16/2016.  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  He also experienced atrial fibrillation with intermittent episodes of RVR requiring IV beta blockers.  He is on prophylactic valacyclovir for 1 year post transplant.  He started vaccinations (DTaP-Pediatric triple vaccine, Hep B- Pediatrix triple vaccine, Haemophilus influenza B (Hib), inactivated polio virus (IPV), pneumococcal conjugate vaccine 13-valent (PCV 13)) on 04/17/2017.  Recommendation was for Shingrix vaccine to be given in [place] and second dose of Shingrix in 2 months.  He had follow-up vaccinations on 08/28/2017.  He had his second shingles vaccine. He received 18 month vaccinations - DTaP-Pediatric triple vaccine, Hep B- Pediatrix triple vaccine, Haemophilus influenza B (Hib), inactivated polio virus (IPV), pneumococcal conjugate vaccine 13-valent (PCV 13) on 02/05/2018.  He is  s/p 20 cycles of Daratumumab (Darzalex) post transplant (12/05/2016 - 07/11/2018).  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  Work-up on 08/23/2016 revealed the following normal studies:  B12 and folate.  Ferritin was 1379 (high).  Iron studies included a saturation of 56% and TIBC 141 (low).  ESR was 64.  Thrombocytopenia has resolved.  He has a history of hypomagnesemia that required IV magnesium (2-4 gm) weekly.  He has not required magnesium since 10/24/2016.  Symptomatically, patient is doing well. He continues to tolerate immunotherapy treatments with minimal side effects. Chronically loose stools persist. Continues PRN use of loperamide. Patient has stable  neuropathy in his hands and feet. He notes that his neuropathy does not impose any functional limitations, nor does it impede his ability to ambulate safely. Exam is stable.  WBC 9000 ([place] 6400).  Plan:  1. Labs today:  CBC with diff, CMP, Mg, SPEP, FLCA. 2. Multiple myeloma Doing well overall. Tolerating treatments with minimal side effects. M-spike 0.2 g/dL. FLCA normal.  Discuss need for interval imaging prior to 2 year follow up with Dr. Ok [facility]) on 08/20/2018  Will order PET scan.  Labs reviewed. Blood counts stable and adequate enough for treatment. Will proceed with cycle #21 daratumumab  Premedications (ondansetron 8 mg, APAP 650 mg, and diphenhydramine 50 mg) taken prior to arrival. Discuss symptom management.  Patient has antiemetics and pain medications at home to use on a PRN basis. Patient  advising that the  prescribed interventions are adequate at this point. Continue all medications as previously prescribed.  3. Diarrhea and reflux  Diarrhea chronic in nature.   Continues on PRN loperamide.   Reflux stable - discussed GI follow up.  4. Atrial fibrillation  Stable. Rate controlled with no recent RVR episodes.   Continues on chronic anticoagulation (apixaban) therapy. Denies increased  bruising or bleeding.  5. RTC in 4 weeks for MD assessment, labs (CBC with diff, CMP, Mg, SPEP, FLCA), and cycle #22 daratumumab.    Honor Loh 08/08/18, 9:56 AM   I saw and evaluated the patient, participating in the key portions of the service and reviewing pertinent diagnostic studies and records.  I reviewed the nurse practitioner's note and agree with the findings and the plan.  The assessment and plan were discussed with the patient.  Multiple questions were asked by the patient and answered.   Nolon Stalls, MD 08/08/2018,9:56 AM

## 2018-08-08 NOTE — Progress Notes (Signed)
Patient here for follow up. Pt received flu shot at [facility] on 10/19.  Pt scheduled to get MMR vaccine in November.

## 2018-08-09 LAB — PROTEIN ELECTROPHORESIS, SERUM
A/G Ratio: 1.6 (ref 0.7–1.7)
Albumin ELP: 3.6 g/dL (ref 2.9–4.4)
Alpha-1-Globulin: 0.2 g/dL (ref 0.0–0.4)
Alpha-2-Globulin: 0.9 g/dL (ref 0.4–1.0)
Beta Globulin: 0.8 g/dL (ref 0.7–1.3)
Gamma Globulin: 0.5 g/dL (ref 0.4–1.8)
Globulin, Total: 2.3 g/dL (ref 2.2–3.9)
M-Spike, %: 0.2 g/dL — ABNORMAL HIGH
Total Protein ELP: 5.9 g/dL — ABNORMAL LOW (ref 6.0–8.5)

## 2018-08-09 LAB — KAPPA/LAMBDA LIGHT CHAINS
Kappa free light chain: 6.9 mg/L (ref 3.3–19.4)
Kappa, lambda light chain ratio: 0.82 (ref 0.26–1.65)
Lambda free light chains: 8.4 mg/L (ref 5.7–26.3)

## 2018-08-14 ENCOUNTER — Ambulatory Visit
Admission: RE | Admit: 2018-08-14 | Discharge: 2018-08-14 | Disposition: A | Discharge status: Home / Self Care | Payer: Medicare Other | Source: Ambulatory Visit | Attending: Urgent Care | Admitting: Urgent Care

## 2018-08-14 DIAGNOSIS — C9001 Multiple myeloma in remission: Secondary | ICD-10-CM

## 2018-08-14 DIAGNOSIS — Z9484 Stem cells transplant status: Secondary | ICD-10-CM | POA: Diagnosis present

## 2018-08-14 DIAGNOSIS — M4854XA Collapsed vertebra, not elsewhere classified, thoracic region, initial encounter for fracture: Secondary | ICD-10-CM | POA: Diagnosis not present

## 2018-08-14 LAB — GLUCOSE, CAPILLARY: Glucose-Capillary: 106 mg/dL — ABNORMAL HIGH (ref 70–99)

## 2018-08-14 MED ORDER — FLUDEOXYGLUCOSE F - 18 (FDG) INJECTION
8.7000 | Freq: Once | INTRAVENOUS | Status: AC | PRN
  Administered 2018-08-14: 9.58 via INTRAVENOUS

## 2018-08-20 ENCOUNTER — Inpatient Hospital Stay: Payer: Medicare Other | Attending: Hematology and Oncology

## 2018-08-20 DIAGNOSIS — C9002 Multiple myeloma in relapse: Secondary | ICD-10-CM | POA: Insufficient documentation

## 2018-08-20 MED ORDER — SODIUM CHLORIDE 0.9% FLUSH
10.0000 mL | INTRAVENOUS | Status: DC | PRN
  Administered 2018-08-20: 10 mL via INTRAVENOUS
  Filled 2018-08-20: qty 10

## 2018-08-20 MED ORDER — HEPARIN SOD (PORK) LOCK FLUSH 100 UNIT/ML IV SOLN
500.0000 [IU] | Freq: Once | INTRAVENOUS | Status: AC
  Administered 2018-08-20: 500 [IU] via INTRAVENOUS

## 2018-08-20 NOTE — Progress Notes (Signed)
Patient stopped in to see if we could discontinue his port needle. He had it placed at [facility] and they sent him home without removing the needle. Port deaccessed without difficulty.

## 2018-08-25 ENCOUNTER — Other Ambulatory Visit: Payer: Self-pay | Admitting: Urgent Care

## 2018-08-25 DIAGNOSIS — C9001 Multiple myeloma in remission: Secondary | ICD-10-CM

## 2018-09-03 ENCOUNTER — Other Ambulatory Visit: Payer: Self-pay

## 2018-09-03 DIAGNOSIS — M47816 Spondylosis without myelopathy or radiculopathy, lumbar region: Secondary | ICD-10-CM

## 2018-09-03 MED ORDER — PAIN MANAGEMENT IT PUMP REFILL: 1.0000 | Freq: Once | INTRATHECAL | Status: DC

## 2018-09-03 MED ORDER — PAIN MANAGEMENT IT PUMP REFILL: 1.0000 | Freq: Once | INTRATHECAL | 0 refills | Status: DC

## 2018-09-04 ENCOUNTER — Other Ambulatory Visit: Payer: Self-pay

## 2018-09-04 DIAGNOSIS — C419 Malignant neoplasm of bone and articular cartilage, unspecified: Secondary | ICD-10-CM

## 2018-09-06 ENCOUNTER — Other Ambulatory Visit: Payer: Self-pay | Admitting: Urgent Care

## 2018-09-06 DIAGNOSIS — C9001 Multiple myeloma in remission: Secondary | ICD-10-CM

## 2018-09-09 ENCOUNTER — Inpatient Hospital Stay: Payer: Medicare Other

## 2018-09-09 ENCOUNTER — Inpatient Hospital Stay (HOSPITAL_BASED_OUTPATIENT_CLINIC_OR_DEPARTMENT_OTHER): Payer: Medicare Other | Admitting: Hematology and Oncology

## 2018-09-09 ENCOUNTER — Encounter: Payer: Self-pay | Admitting: Hematology and Oncology

## 2018-09-09 ENCOUNTER — Other Ambulatory Visit: Payer: Self-pay | Admitting: Hematology and Oncology

## 2018-09-09 ENCOUNTER — Inpatient Hospital Stay: Payer: Medicare Other | Attending: Hematology and Oncology

## 2018-09-09 VITALS — BP 136/83 | HR 76 | Temp 97.6°F | Resp 18 | Wt 170.3 lb

## 2018-09-09 DIAGNOSIS — C9002 Multiple myeloma in relapse: Secondary | ICD-10-CM | POA: Insufficient documentation

## 2018-09-09 DIAGNOSIS — R197 Diarrhea, unspecified: Secondary | ICD-10-CM | POA: Diagnosis not present

## 2018-09-09 DIAGNOSIS — C9001 Multiple myeloma in remission: Secondary | ICD-10-CM

## 2018-09-09 DIAGNOSIS — C419 Malignant neoplasm of bone and articular cartilage, unspecified: Secondary | ICD-10-CM

## 2018-09-09 DIAGNOSIS — Z7901 Long term (current) use of anticoagulants: Secondary | ICD-10-CM

## 2018-09-09 DIAGNOSIS — Z5112 Encounter for antineoplastic immunotherapy: Secondary | ICD-10-CM | POA: Diagnosis not present

## 2018-09-09 DIAGNOSIS — L989 Disorder of the skin and subcutaneous tissue, unspecified: Secondary | ICD-10-CM

## 2018-09-09 DIAGNOSIS — I10 Essential (primary) hypertension: Secondary | ICD-10-CM

## 2018-09-09 DIAGNOSIS — I4891 Unspecified atrial fibrillation: Secondary | ICD-10-CM | POA: Diagnosis not present

## 2018-09-09 DIAGNOSIS — Z9484 Stem cells transplant status: Secondary | ICD-10-CM

## 2018-09-09 DIAGNOSIS — Z87891 Personal history of nicotine dependence: Secondary | ICD-10-CM

## 2018-09-09 LAB — COMPREHENSIVE METABOLIC PANEL
ALT: 14 U/L (ref 0–44)
AST: 24 U/L (ref 15–41)
Albumin: 4 g/dL (ref 3.5–5.0)
Alkaline Phosphatase: 38 U/L (ref 38–126)
Anion gap: 8 (ref 5–15)
BUN: 18 mg/dL (ref 8–23)
CO2: 23 mmol/L (ref 22–32)
Calcium: 9.1 mg/dL (ref 8.9–10.3)
Chloride: 107 mmol/L (ref 98–111)
Creatinine, Ser: 1.33 mg/dL — ABNORMAL HIGH (ref 0.61–1.24)
GFR calc Af Amer: >60 mL/min (ref >60)
GFR calc non Af Amer: 53 mL/min — ABNORMAL LOW (ref >60)
Glucose, Bld: 130 mg/dL — ABNORMAL HIGH (ref 70–99)
Potassium: 4 mmol/L (ref 3.5–5.1)
Sodium: 138 mmol/L (ref 135–145)
Total Bilirubin: 0.6 mg/dL (ref 0.3–1.2)
Total Protein: 6 g/dL — ABNORMAL LOW (ref 6.5–8.1)

## 2018-09-09 LAB — CBC WITH DIFFERENTIAL/PLATELET
Abs Immature Granulocytes: 0.02 10*3/uL (ref 0.00–0.07)
Basophils Absolute: 0 10*3/uL (ref 0.0–0.1)
Basophils Relative: 1 %
Eosinophils Absolute: 0.1 10*3/uL (ref 0.0–0.5)
Eosinophils Relative: 2 %
HCT: 34.9 % — ABNORMAL LOW (ref 39.0–52.0)
Hemoglobin: 11.8 g/dL — ABNORMAL LOW (ref 13.0–17.0)
Immature Granulocytes: 0 %
Lymphocytes Relative: 28 %
Lymphs Abs: 2 10*3/uL (ref 0.7–4.0)
MCH: 34.3 pg — ABNORMAL HIGH (ref 26.0–34.0)
MCHC: 33.8 g/dL (ref 30.0–36.0)
MCV: 101.5 fL — ABNORMAL HIGH (ref 80.0–100.0)
Monocytes Absolute: 0.5 10*3/uL (ref 0.1–1.0)
Monocytes Relative: 8 %
Neutro Abs: 4.4 10*3/uL (ref 1.7–7.7)
Neutrophils Relative %: 61 %
Platelets: 165 10*3/uL (ref 150–400)
RBC: 3.44 MIL/uL — ABNORMAL LOW (ref 4.22–5.81)
RDW: 12.9 % (ref 11.5–15.5)
WBC: 7.2 10*3/uL (ref 4.0–10.5)
nRBC: 0 % (ref 0.0–0.2)

## 2018-09-09 LAB — MAGNESIUM: Magnesium: 1.9 mg/dL (ref 1.7–2.4)

## 2018-09-09 MED ORDER — SODIUM CHLORIDE 0.9% FLUSH
10.0000 mL | Freq: Once | INTRAVENOUS | Status: AC
  Administered 2018-09-09: 10 mL via INTRAVENOUS
  Filled 2018-09-09: qty 10

## 2018-09-09 MED ORDER — SODIUM CHLORIDE 0.9 % IV SOLN
Freq: Once | INTRAVENOUS | Status: AC
  Administered 2018-09-09: 11:00:00 via INTRAVENOUS
  Filled 2018-09-09: qty 250

## 2018-09-09 MED ORDER — HEPARIN SOD (PORK) LOCK FLUSH 100 UNIT/ML IV SOLN
500.0000 [IU] | Freq: Once | INTRAVENOUS | Status: AC
  Administered 2018-09-09: 500 [IU] via INTRAVENOUS
  Filled 2018-09-09: qty 5

## 2018-09-09 MED ORDER — SODIUM CHLORIDE 0.9 % IV SOLN
1200.0000 mg | Freq: Once | INTRAVENOUS | Status: AC
  Administered 2018-09-09: 1200 mg via INTRAVENOUS
  Filled 2018-09-09: qty 60

## 2018-09-09 NOTE — Progress Notes (Signed)
Pt in for follow up, denies any difficulties or concerns today. 

## 2018-09-09 NOTE — Progress Notes (Signed)
[facility]:  09/09/18  Chief Complaint: Logan Perez is a 72 y.o. 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   HPI: The patient was last seen in the medical oncology clinic on 08/08/2018.  At that time, he was doing well. He continued to tolerate immunotherapy treatments with minimal side effects. Chronically loose stools persisted. Continues PRN use of loperamide. 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 His neuropathy does not impose any functional limitations, nor does it impede his ability to ambulate safely. Exam was stable.  WBC 9000 (Tool 6400).  M-spike was 0.2.  He received cycle #21 daratumumab.  PET scan on 08/14/2018 revealed no evidence of active myeloma on whole-body FDG PET scan.  There was no evidence soft tissue plasmacytoma.  There was no clear evidence of lytic lesions on the CT portion exam. Some lucencies in the pelvis were stable. There were chronic compression fractures in the lower thoracic spine.  He saw Dr. Virgina Norfolk at [facility] on 08/20/2018.  Notes reviewed.  He received the pneumococcal vaccine, but NOT the MMR secondary to his ongoing daratumumab and avoidance of live vaccines.  He was felt to be in remission without evidence of progression.  During the interim, patient complains with a "real superficial pain" to his LEFT groin area that began earlier this morning. Pain is worse with movement and palpation. He continues to have diarrhea episodes about every 3 days. He continues on PRN loperamide. Patient denies that he has experienced any B symptoms. He denies any interval infections.   Patient has some new areas of concern to his RIGHT temporoparietal scalp. He is under the care of dermatology.   He has planned  dental procedure coming up. Patient is asking about holding his anticoagulation. Of note, patient is on chronic anticoagulation therapy secondary to his underlying atrial fibrillation.   Patient advises that he maintains an adequate appetite. He is eating well. Weight today is 170 lb 5 oz (77.3 kg), which compared to his last visit to the clinic, represents a 3 pound increase.   Patient denies pain in the clinic today.   Past Medical History:  Diagnosis Date  . Anxiety   . Atrial fibrillation ([place])   . BPH (benign prostatic hyperplasia)   . Complication of anesthesia    BAD HEADACHE NIGHT OF FIRST CATARACT  . Compression fracture of lumbar vertebra ([place])   . Difficulty voiding   . Dysrhythmia    A FIB  . Elevated PSA   . GERD (gastroesophageal reflux disease)   . Hearing aid worn    bilateral  . History of kidney stones   . HLD (hyperlipidemia)   . HOH (hard of hearing)   . Hypertension   . Multiple myeloma ([place])   . Neuropathy    feet. 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  . Pain    BACK  . Palpitations   . Pneumonia   . Pulmonary embolism ([place])   . Sepsis ([place])   . Stroke [facility])    TIA, detected on CT scan. pt was unaware    Past Surgical History:  Procedure Laterality Date  . BACK SURGERY  1994  . CATARACT EXTRACTION W/PHACO Right 05/02/2016   Procedure: CATARACT EXTRACTION PHACO AND INTRAOCULAR LENS PLACEMENT (IOC);  Surgeon: Birder Robson,  MD;  Location: [facility] ORS;  Service: Ophthalmology;  Laterality: Right;  [place] 1.06 AP% 20.6 CDE 13.70 FLUID PACK LOT # [id number]  . CATARACT EXTRACTION W/PHACO Left 05/16/2016   Procedure: CATARACT EXTRACTION PHACO AND INTRAOCULAR LENS PLACEMENT (IOC);  Surgeon: Birder Robson, MD;  Location: [facility] ORS;  Service: Ophthalmology;  Laterality: Left;  [place] 01:43 AP% 19.8 CDE 20.45 FLUID PACK LOT #[id number] H  . COLONOSCOPY WITH PROPOFOL N/A 03/22/2017   Procedure: COLONOSCOPY WITH PROPOFOL;  Surgeon: Lucilla Lame, MD;  Location: [place];   Service: Endoscopy;  Laterality: N/A;  has port  . ESOPHAGOGASTRODUODENOSCOPY (EGD) WITH PROPOFOL  03/22/2017   Procedure: ESOPHAGOGASTRODUODENOSCOPY (EGD) WITH PROPOFOL;  Surgeon: Lucilla Lame, MD;  Location: [place];  Service: Endoscopy;;  . EYE SURGERY    . KNEE ARTHROSCOPY Left 1992  . LIMBAL STEM CELL TRANSPLANT    . PAIN PUMP IMPLANTATION  2012  . PAIN PUMP IMPLANTATION N/A 09/04/2017   Procedure: INTRATHECAL PUMP BATTERY CHANGE;  Surgeon: Milinda Pointer, MD;  Location: [facility] OR[facility];  Service: Neurosurgery;  Laterality: N/A;  . PORTA CATH INSERTION N/A 12/04/2016   Procedure: Glori Luis Cath Insertion;  Surgeon: Algernon Huxley, MD;  Location: [place] [facility];  Service: Cardiovascular;  Laterality: N/A;  . stem cell implant  2008   [facility]    Family History  Problem Relation Age of Onset  . Cancer Father        throat  . Kidney disease Sister   . Stroke Unknown   . Stroke Mother   . Bladder Cancer Neg Hx   . Prostate cancer Neg Hx   . Kidney cancer Neg Hx     Social History:  reports that he quit smoking about 42 years ago. His smoking use included cigarettes. He has a 30.00 pack-year smoking history. He has never used smokeless tobacco. He reports that he does not drink alcohol or use drugs.  He has a wire haired dachshund.  He lives in [place].  His wife's name is [name].  The patient is alone today.  Allergies:  Allergies  Allergen Reactions  . Azithromycin Diarrhea and Other (See Comments)    Possible cause of C. Diff Possible cause of C. Diff Possible cause of C. Diff Possible cause of C. Diff  . Bee Pollen Other (See Comments)    Sneezing, watery eyes, runny nose  . Pollen Extract Other (See Comments)    Sneezing, watery eyes, runny nose  . 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 Rivaroxaban Rash    Current Medications: Current Outpatient Medications  Medication Sig Dispense Refill  .  acetaminophen (TYLENOL) 325 MG tablet Take 650 mg every 6 (six) hours as needed by mouth for mild pain or headache.     . ALPRAZolam (XANAX) 0.25 MG tablet TAKE 1 TABLET BY MOUTH EVERY NIGHT AT BEDTIME 30 tablet 0  . baclofen (LIORESAL) 10 MG tablet Take 1 tablet (10 mg total) by mouth 2 (two) times daily. 60 tablet 5  . Calcium Carb-Cholecalciferol (CALCIUM-VITAMIN D) 500-400 MG-UNIT TABS Take 1 tablet daily by mouth.    . cetirizine (ZYRTEC) 10 MG tablet Take 10 mg by mouth daily as needed for allergies.     . Cholecalciferol (VITAMIN D3) 2000 units capsule Take 2,000 Units daily by mouth.     . Daratumumab (DARZALEX IV) Inject 1,200 mg every 30 (thirty) days into the vein.     [name]  dexamethasone (DECADRON) 4 MG tablet TAKE 4 TABLETS BY MOUTH 1 HOUR TO PRIOR TO INFUSION THEN TAKE 1 TABLET ON DAY 2 AND 3 30 tablet 0  . diltiazem (CARDIZEM CD) 120 MG 24 hr capsule Take 120 mg daily by mouth.     . diltiazem (CARDIZEM) 60 MG tablet Take 60 mg daily as needed by mouth (for increased heart rate > 140).     [name] diphenhydrAMINE (BENADRYL) 25 MG tablet Take 25 mg by mouth as directed. With chemo treatment    . ELIQUIS 5 MG TABS tablet TAKE 1 TABLET BY MOUTH TWICE DAILY. 60 tablet 0  . ENSURE (ENSURE) Take 237 mLs by mouth daily.    . fluticasone (FLONASE) 50 MCG/ACT nasal spray Place 1 spray into the nose daily as needed for allergies.     . Hypromellose (ARTIFICIAL TEARS OP) Place 1 drop as needed into both eyes (for dry eyes).    [name] loperamide (IMODIUM) 2 MG capsule Take 4 mg as needed by mouth for diarrhea or loose stools.     [name] loratadine (CLARITIN) 10 MG tablet Take 10 mg by mouth daily.    [name] lovastatin (MEVACOR) 20 MG tablet Take 20 mg by mouth every evening.     . montelukast (SINGULAIR) 10 MG tablet TAKE 1 TABLET BY MOUTH DAILY THE DAY BEFORE THE INFUSION, THEN DAY OF AND THE 2 DAYS AFTER INFUSION 30 tablet 0  . Multiple Vitamins-Iron (MULTIVITAMIN/IRON PO) Take 1 tablet daily by mouth.    . naproxen  sodium (ALEVE) 220 MG tablet Take 220 mg by mouth.    [name] omeprazole (PRILOSEC) 20 MG capsule Take 20 mg by mouth daily.     . ondansetron (ZOFRAN) 8 MG tablet Take 1 tablet (8 mg total) by mouth as directed. Take with chemo and can take it as needed 20 tablet 1  . PAIN MANAGEMENT IT PUMP REFILL 1 each by Intrathecal route once for 1 dose. Medication: PF Fentanyl 1,500.0 mcg/ml PF Bupivicaine 30.0 mg/ml PF Clonidine 300.0 mcg/ml Total Volume: 40 ml Needed by 09-11-18 @ 1000 1 each 0  . potassium chloride SA (K-DUR,KLOR-CON) 20 MEQ tablet TAKE 1 TABLET BY MOUTH DAILY 90 tablet 0  . ranitidine (ZANTAC) 150 MG tablet Take 150 mg by mouth at bedtime.     . tamsulosin (FLOMAX) 0.4 MG CAPS capsule Take 1 capsule (0.4 mg total) by mouth daily. 90 capsule 4  . vitamin B-12 (CYANOCOBALAMIN) 1000 MCG tablet Take 1,000 mcg daily by mouth.     . bismuth subsalicylate (PEPTO BISMOL) 262 MG/15ML suspension Take 30 mLs by mouth every 6 (six) hours as needed for diarrhea or loose stools.    . valACYclovir (VALTREX) 500 MG tablet TAKE 1 TABLET BY MOUTH DAILY (Patient not taking: Reported on 08/08/2018) 30 tablet 0   No current facility-administered medications for this visit.    Facility-Administered Medications Ordered in Other Visits  Medication Dose Route Frequency Provider Last Rate Last Dose  . heparin lock flush 100 unit/mL  500 Units Intravenous Once Corcoran, Melissa C, MD      . sodium chloride flush (NS) 0.9 % injection 10 mL  10 mL Intravenous Once Lequita Asal, MD        Review of Systems  Constitutional: Negative for chills, diaphoresis, fever, malaise/fatigue and weight loss (up 3 pounds).       Feels "alright".  HENT: Positive for hearing loss (hearing aides). Negative for congestion, ear discharge, ear pain, nosebleeds, sinus pain and  sore throat.   Eyes: Negative.  Negative for double vision, photophobia, pain, discharge and redness.  Respiratory: Negative.  Negative for cough,  hemoptysis, sputum production and shortness of breath.   Cardiovascular: Negative for chest pain, palpitations, orthopnea, leg swelling and PND.       PMH (+) for atrial fibrillation  Gastrointestinal: Positive for diarrhea (chronic - getting "a handle on it"). Negative for abdominal pain, blood in stool, constipation, heartburn, melena, nausea and vomiting.       Eating pretty well.  Genitourinary: Negative.  Negative for dysuria, frequency, hematuria and urgency.  Musculoskeletal: Positive for back pain (chronic; has implanted Baclofen pump). Negative for falls, joint pain and myalgias.  Skin: Negative for itching and rash.       Scalp lesion.  Neurological: Positive for sensory change (stable neuropathy to fingers and toes). Negative for dizziness, tremors, focal weakness, weakness and headaches.  Endo/Heme/Allergies: Does not bruise/bleed easily.  Psychiatric/Behavioral: Negative for depression and memory loss. The patient is not nervous/anxious and does not have insomnia.   All other systems reviewed and are negative.  Performance status (ECOG): 1  Vital signs BP 136/83 (BP Location: Left Arm, Patient Position: Sitting)   Pulse 76   Temp 97.6 F (36.4 C) (Tympanic)   Resp 18   Wt 170 lb 5 oz (77.3 kg)   SpO2 98%   BMI 26.67 kg/m   Physical Exam  Constitutional: He is oriented to person, place, and time and well-developed, well-nourished, and in no distress. No distress.  HENT:  Head: Normocephalic and atraumatic.  Right Ear: Decreased hearing is noted.  Left Ear: Decreased hearing is noted.  Mouth/Throat: Oropharynx is clear and moist and mucous membranes are normal. No oropharyngeal exudate.  Gray hair and [employer].  Eyes: Pupils are equal, round, and reactive to light. Conjunctivae and EOM are normal. No scleral icterus.  Neck: Normal range of motion. Neck supple. No JVD present.  Cardiovascular: Normal rate, regular rhythm, normal heart sounds and intact distal pulses. Exam  reveals no gallop and no friction rub.  No murmur heard. Pulmonary/Chest: Effort normal and breath sounds normal. No respiratory distress. He has no wheezes. He has no rales.  Abdominal: Soft. Bowel sounds are normal. He exhibits no distension and no mass. There is no abdominal tenderness. There is no rebound and no guarding.  Musculoskeletal: Normal range of motion.        General: No tenderness or edema.     Comments: Implanted Baclofen pump  Lymphadenopathy:    He has no cervical adenopathy.    He has no axillary adenopathy.       Right: No supraclavicular adenopathy present.       Left: No supraclavicular adenopathy present.  Neurological: He is alert and oriented to person, place, and time.  Skin: Skin is warm and dry. No rash noted. He is not diaphoretic. No erythema.  Skin graft to tip of nose following Moh's surgery. Left forehead lesion. Light brown < 1 cm scalp lesion.  Small area of ecchymosis dorsum left hand.  Psychiatric: Mood, affect and judgment normal.  Nursing note and vitals reviewed.    Infusion on 09/09/2018  Component Date Value Ref Range Status  . Magnesium 09/09/2018 1.9  1.7 - 2.4 mg/dL Final   Performed at [facility], [address] [place], [place] [address]  . Sodium 09/09/2018 138  135 - 145 mmol/L Final  . Potassium 09/09/2018 4.0  3.5 - 5.1 mmol/L Final  . Chloride 09/09/2018  107  98 - 111 mmol/L Final  . CO2 09/09/2018 23  22 - 32 mmol/L Final  . Glucose, Bld 09/09/2018 130* 70 - 99 mg/dL Final  . BUN 09/09/2018 18  8 - 23 mg/dL Final  . Creatinine, Ser 09/09/2018 1.33* 0.61 - 1.24 mg/dL Final  . Calcium 09/09/2018 9.1  8.9 - 10.3 mg/dL Final  . Total Protein 09/09/2018 6.0* 6.5 - 8.1 g/dL Final  . Albumin 09/09/2018 4.0  3.5 - 5.0 g/dL Final  . AST 09/09/2018 24  15 - 41 U/L Final  . ALT 09/09/2018 14  0 - 44 U/L Final  . Alkaline Phosphatase 09/09/2018 38  38 - 126 U/L Final  . Total Bilirubin 09/09/2018 0.6  0.3 - 1.2 mg/dL Final  .  GFR calc non Af Amer 09/09/2018 53* >60 mL/min Final  . GFR calc Af Amer 09/09/2018 >60  >60 mL/min Final  . Anion gap 09/09/2018 8  5 - 15 Final   Performed at [facility], [address], [place], [place] [address]  . WBC 09/09/2018 7.2  4.0 - 10.5 K/uL Final  . RBC 09/09/2018 3.44* 4.22 - 5.81 MIL/uL Final  . Hemoglobin 09/09/2018 11.8* 13.0 - 17.0 g/dL Final  . HCT 09/09/2018 34.9* 39.0 - 52.0 % Final  . MCV 09/09/2018 101.5* 80.0 - 100.0 fL Final  . MCH 09/09/2018 34.3* 26.0 - 34.0 pg Final  . MCHC 09/09/2018 33.8  30.0 - 36.0 g/dL Final  . RDW 09/09/2018 12.9  11.5 - 15.5 % Final  . Platelets 09/09/2018 165  150 - 400 K/uL Final  . nRBC 09/09/2018 0.0  0.0 - 0.2 % Final  . Neutrophils Relative % 09/09/2018 61  % Final  . Neutro Abs 09/09/2018 4.4  1.7 - 7.7 K/uL Final  . Lymphocytes Relative 09/09/2018 28  % Final  . Lymphs Abs 09/09/2018 2.0  0.7 - 4.0 K/uL Final  . Monocytes Relative 09/09/2018 8  % Final  . Monocytes Absolute 09/09/2018 0.5  0.1 - 1.0 K/uL Final  . Eosinophils Relative 09/09/2018 2  % Final  . Eosinophils Absolute 09/09/2018 0.1  0.0 - 0.5 K/uL Final  . Basophils Relative 09/09/2018 1  % Final  . Basophils Absolute 09/09/2018 0.0  0.0 - 0.1 K/uL Final  . Immature Granulocytes 09/09/2018 0  % Final  . Abs Immature Granulocytes 09/09/2018 0.02  0.00 - 0.07 K/uL Final   Performed at [facility], [address] [place], [place] [address]    Assessment:  Logan Perez is a 72 y.o. male with stage III mutiple myeloma.  He initially presented with progressive back pain beginning in 12/2006.  MRI revealed "spots and compression fractures".  He began Velcade, thalidomide, and Decadron.  In 08/2007, he underwent high dose chemotherapy and autologous stem cell transplant.  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  He recurred with a rising M-spike (2.7) with repeat M spike (1.7 gm/dl) in 03/2010.  He was initially treated  with Velcade (02/08/2010 - 05/10/2010).  He then began Revlimid (15 mg 3 weeks on/1 week off) and Decadron (40 mg on day 1, 8, 15, 22).  Because of significant side effect with Decadron his dose was decreased to 10 mg once a week in 07/2010.    He was on maintenance Revlimid. Revlimid was initially10 mg 3 weeks on/1 week off. This was changed to 10 mg 2 weeks on/2 weeks off secondary to right nipple tenderness. His dose was increased to  10 mg 3 weeks on/1 week off with Decadron 10 mg a week (on Sundays) and then Revlamid 15 mg 3 weeks on and 1 week off with Decadron on Sundays.  He began Pomalyst 4 mg 3 weeks on/1 week off with Decadron on 08/27/2015.  Over the past year his SPEP has revealed no monoclonal protein (04/21/2015) and 0.5 gm/dL on 09/22/2015 and 10/20/2015. M spike was 0.1 on 02/02/2016, 03/01/2016, 03/29/2016, 04/26/2016, and 05/24/2016.  M spike was 0 on 10/11/2016, 11/28/2016, 02/05/2017, 03/21/2017, 08/02/2017, 10/04/2017, 12/27/2017, 01/24/2018, and 02/21/2018.  M spike was 0.1 on 11/01/2017, 0.1 on 11/29/2017, 0 on 12/27/2017, 0 on 01/24/2018, 0 on 02/21/2018, 0.1 on 03/21/2018, 0.3 on 04/18/2018, 0.1 on 05/16/2018, 0 on 06/13/2018, 0.2 on 08/08/2018, and 0 on 09/09/2018.  Free light chains have been monitored. Kappa free light chains were 18.54 on 11/21/2013, 18.37 on 02/20/2014, 18.93 on 05/22/2014, 32.58 (high; normal ratio 1.27) on 08/21/2014, 51.53 (high; elevated ratio of 2.12) on 11/13/2014, 28.08 (ratio 1.73) on 12/09/2014, 23.71 (ratio 2.17) on 01/18/2015, 92.93 (ratio 9.49) on 04/21/2015, 93.44 (ratio 10.28) on 05/26/2015, 255.45 (ratio of 24.05) on 07/14/2015, 373.89 (ratio 48.31) on 08/04/2015, 474.33 (ratio 70.58) on 08/25/2015, 450.76 (ratio 55.44) on 09/22/2015, 453.4 (ratio 59.89) on 10/20/2015, 58.26 (ratio of > 40.74) on 02/02/2016, 62.67 (ratio of 37.98) on 03/01/2016, 75.7 (ratio > 50.47) on 03/29/2016, 79.7 (ratio 53.13) on 04/26/2016, 108.6 (ratio > 72.4) on  05/24/2016, 8.3 (1.46 ratio) on 10/11/2016, 6.7 (0.81 ratio) on 02/05/2017, 7.8 (1.01 ratio) on 03/21/2017,7.6 (ratio 0.63) on 06/01/2017, 8.1 (ratio 1.13) on 11/29/2017, 9.6 (ratio 1.55) on 06/13/2018, 6.6 (ratio 2.06) on 07/11/2018, 6.9 (ratio 0.82) on 08/08/2018, and 5.8 (ratio 1.66) on 09/09/2018.  Bone survey on 12/08/2014 was stable.  Bone survey on 10/21/2015 revealed increase conspicuity of subcentimeter lytic lesions in the calvarium.  Bone marrow aspirate and biopsy on 11/04/2015 revealed an atypical monoclonal plasma cells estimated at 30-40% of marrow cells.   Marrow was variably cellular (approximately 45%) with background trilineage hematopoiesis. There was no significant increase in marrow reticulin fibers. Storage iron was present.    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 He develoed herpes zoster in 04/2008. He developed a pulmonary embolism in 05/2013. He was initially on Xarelto, but is now on Eliquis. He had an episode of pneumonia around this time requiring a brief admission. He developed severe lower leg cramps on 08/07/2014 secondary to hypokalemia. Duplex was negative.   He was treated for C difficile colitis (Flagyl completed 07/30/2015).  He has a chronic indwelling pain pump.  He received 4 cycles of Pomalyst and Decadron (08/27/2015 - 11/19/2015).  Restaging studies document progressive disease.  Kappa free light chains are increasing.  SPEP revealed 0.5 gm/dL monoclonal protein then 1.3 gm/dL.  Bone survey reveals increase conspicuity of subcentimeter lytic lesions in the calvarium.  Bone marrow reveals 30-40% plasma cells.   MUGA on 11/30/2015 revealed an ejection fraction of 46%.  He is felt not to be a good candidate for Kyprolis.  There were no focal wall motion abnormalities. He had a stress echo less than 1 year ago.  He has a history of PVCs and atrial fibrillation.  He takes Cardizem prn.  He received  17 weeks of daratumumab (Darzalex) (12/09/2015 - 05/25/2016).  He tolerated treatment well without side effect.  Bone marrow on 02/09/2016 revealed no diagnostic morphologic evidence of plasma cell myeloma.  Marrow was normocellular to hypocellular marrow for age (ranging from  10-40%) with maturing trilineage hematopoiesis and mild multilineage dyspoiesis.  There was patchy mild increase in reticulin.  Storage iron was present.  Flow cytometry revealed no definitive evidence of monoclonality.  There was a non-specific atypical myeloid and monocytic findings with no increase in blasts.  Cytogenetics were normal (46, XY).  He is currently 27 months s/p 2nd autologous stem cell transplant on 06/16/2016.  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  He also experienced atrial fibrillation with intermittent episodes of RVR requiring IV beta blockers.  He is on prophylactic valacyclovir for 1 year post transplant.  He started vaccinations (DTaP-Pediatric triple vaccine, Hep B- Pediatrix triple vaccine, Haemophilus influenza B (Hib), inactivated polio virus (IPV), pneumococcal conjugate vaccine 13-valent (PCV 13)) on 04/17/2017.  Recommendation was for Shingrix vaccine to be given in [place] and second dose of Shingrix in 2 months.  He had follow-up vaccinations on 08/28/2017.  He had his second shingles vaccine. He received 18 month vaccinations - DTaP-Pediatric triple vaccine, Hep B- Pediatrix triple vaccine, Haemophilus influenza B (Hib), inactivated polio virus (IPV), pneumococcal conjugate vaccine 13-valent (PCV 13) on 02/05/2018.  He is s/p 21 cycles of Daratumumab (Darzalex) post transplant (12/05/2016 - 08/08/2018).  PET scan on 08/14/2018 revealed no evidence of active myeloma on whole-body FDG PET scan.  There was no evidence soft tissue plasmacytoma.  There was no clear evidence of lytic lesions on the CT portion exam. Some lucencies in the pelvis  were stable. There were chronic compression fractures in the lower thoracic spine.  He has a history of hypomagnesemia that required IV magnesium (2-4 gm) weekly.  He has not required magnesium since 10/24/2016.  Symptomatically, he is doing well.  He has some superficial LLQ pain (exam benign). He has some new skin lesions.  Plan:  1.   Labs today:  CBC with diff, CMP, Mg, SPEP, FLCA. 2.   Multiple myeloma: Discuss interval PET scan- no evidence of active myeloma.  Images personally reviewed.  Agree with radiology interpretation. Discuss slight blips in M-spike.  Continue to monitor with treatment. Discuss follow-up with transplant unit.  3.   Diarrhea: Patient continues to adjust medications. Etiology unrelated to treatment. 4.   Atrial fibrillation: Stable. Continues chronic anticoagulation (apixaban) therapy. D Patient to follow-up with dentist and cardiologist regarding Eliquis. 5.   Skin lesions:   Patient to follow-up with dermatology. 6.   RTC in 4 weeks for MD assessment, labs (CBC with diff, CMP, Mg, SPEP, FLCA), and cycle #23 daratumumab.   Honor Loh 09/09/18, 10:16 AM   I saw and evaluated the patient, participating in the key portions of the service and reviewing pertinent diagnostic studies and records.  I reviewed the nurse practitioner's note and agree with the findings and the plan.  The assessment and plan were discussed with the patient.  Multiple questions were asked by the patient and answered.   Nolon Stalls, MD 09/09/2018,10:16 AM

## 2018-09-10 ENCOUNTER — Other Ambulatory Visit: Payer: Self-pay | Admitting: Urgent Care

## 2018-09-10 DIAGNOSIS — C9001 Multiple myeloma in remission: Secondary | ICD-10-CM

## 2018-09-10 LAB — PROTEIN ELECTROPHORESIS, SERUM
A/G Ratio: 1.8 — ABNORMAL HIGH (ref 0.7–1.7)
Albumin ELP: 3.5 g/dL (ref 2.9–4.4)
Alpha-1-Globulin: 0.1 g/dL (ref 0.0–0.4)
Alpha-2-Globulin: 0.7 g/dL (ref 0.4–1.0)
Beta Globulin: 0.9 g/dL (ref 0.7–1.3)
Gamma Globulin: 0.2 g/dL — ABNORMAL LOW (ref 0.4–1.8)
Globulin, Total: 1.9 g/dL — ABNORMAL LOW (ref 2.2–3.9)
Total Protein ELP: 5.4 g/dL — ABNORMAL LOW (ref 6.0–8.5)

## 2018-09-10 LAB — KAPPA/LAMBDA LIGHT CHAINS
Kappa free light chain: 5.8 mg/L (ref 3.3–19.4)
Kappa, lambda light chain ratio: 1.66 — ABNORMAL HIGH (ref 0.26–1.65)
Lambda free light chains: 3.5 mg/L — ABNORMAL LOW (ref 5.7–26.3)

## 2018-09-11 ENCOUNTER — Encounter: Payer: Self-pay | Admitting: Nurse Practitioner

## 2018-09-11 ENCOUNTER — Ambulatory Visit: Payer: Medicare Other | Attending: Nurse Practitioner | Admitting: Nurse Practitioner

## 2018-09-11 VITALS — BP 143/77 | HR 62 | Temp 97.9°F | Resp 16 | Ht 66.0 in | Wt 166.0 lb

## 2018-09-11 DIAGNOSIS — Z881 Allergy status to other antibiotic agents status: Secondary | ICD-10-CM | POA: Diagnosis not present

## 2018-09-11 DIAGNOSIS — M47816 Spondylosis without myelopathy or radiculopathy, lumbar region: Secondary | ICD-10-CM | POA: Insufficient documentation

## 2018-09-11 DIAGNOSIS — M4854XS Collapsed vertebra, not elsewhere classified, thoracic region, sequela of fracture: Secondary | ICD-10-CM | POA: Diagnosis not present

## 2018-09-11 DIAGNOSIS — M545 Low back pain, unspecified: Secondary | ICD-10-CM

## 2018-09-11 DIAGNOSIS — G8929 Other chronic pain: Secondary | ICD-10-CM

## 2018-09-11 DIAGNOSIS — Z79899 Other long term (current) drug therapy: Secondary | ICD-10-CM | POA: Insufficient documentation

## 2018-09-11 DIAGNOSIS — Z978 Presence of other specified devices: Secondary | ICD-10-CM

## 2018-09-11 DIAGNOSIS — Z7901 Long term (current) use of anticoagulants: Secondary | ICD-10-CM | POA: Diagnosis not present

## 2018-09-11 DIAGNOSIS — G893 Neoplasm related pain (acute) (chronic): Secondary | ICD-10-CM | POA: Diagnosis not present

## 2018-09-11 DIAGNOSIS — S22080S Wedge compression fracture of T11-T12 vertebra, sequela: Secondary | ICD-10-CM

## 2018-09-11 LAB — MULTIPLE MYELOMA PANEL, SERUM
Albumin SerPl Elph-Mcnc: 3.6 g/dL (ref 2.9–4.4)
Albumin/Glob SerPl: 2.1 — ABNORMAL HIGH (ref 0.7–1.7)
Alpha 1: 0.1 g/dL (ref 0.0–0.4)
Alpha2 Glob SerPl Elph-Mcnc: 0.7 g/dL (ref 0.4–1.0)
B-Globulin SerPl Elph-Mcnc: 0.7 g/dL (ref 0.7–1.3)
Gamma Glob SerPl Elph-Mcnc: 0.2 g/dL — ABNORMAL LOW (ref 0.4–1.8)
Globulin, Total: 1.8 g/dL — ABNORMAL LOW (ref 2.2–3.9)
IgA: 25 mg/dL — ABNORMAL LOW (ref 61–437)
IgG (Immunoglobin G), Serum: 285 mg/dL — ABNORMAL LOW (ref 700–1600)
IgM (Immunoglobulin M), Srm: 25 mg/dL (ref 15–143)
M Protein SerPl Elph-Mcnc: 0.1 g/dL — ABNORMAL HIGH
Total Protein ELP: 5.4 g/dL — ABNORMAL LOW (ref 6.0–8.5)

## 2018-09-11 NOTE — Progress Notes (Signed)
Patient's Name: Logan Perez  MRN: [id number]  Referring Provider: Sofie Hartigan, MD  DOB: 1946/01/14  PCP: Sofie Hartigan, MD  DOS: 09/11/2018  Note by: Dionisio David, NP  Service setting: Ambulatory outpatient  Specialty: Interventional Pain Management  Patient type: Established  Location: [facility] (AMB) Pain Management Facility  Visit type: Interventional Procedure   Primary Reason for Visit: Interventional Pain Management Treatment. CC: Back Pain (lumbar )  Procedure:  Intrathecal Drug Delivery System (IDDS):  Type: Reservoir Refill [phone])       Region: Abdominal Laterality: Right  Type of Pump: Medtronic Synchromed II (MRI-compatible) Delivery Route: Intrathecal Type of Pain Treated: Neuropathic/Nociceptive Primary Medication Class: Opioid/opiate  Medication, Concentration, Infusion Program, & Delivery Rate: Please see scanned programming printout.  Indications: 1. Lumbar spondylosis   2. Cancer associated pain   3. Compression fracture of T12 vertebra, sequela   4. Presence of intrathecal pump   5. Chronic low back pain    Pain Assessment: Self-Reported Pain Score: 0-No pain/10             Reported level is compatible with observation.        Intrathecal Pump Therapy Assessment  Manufacturer: Medtronic Synchromed II Type: Programmable Volume: 40 mL reservoir MRI compatibility: Yes   Drug content:  Primary Medication Class: Opioid Primary Medication: PF-Fentanyl Secondary Medication: PF-Bupivacaine Other Medication: PF-Clonidine   Programming:  Type: Simple continuous. See pump readout for details.   Changes:  Medication Change: None at this point Rate Change: No change in rate  Reported side-effects or adverse reactions: None reported  Effectiveness: Described as relatively effective, allowing for increase in activities of daily living (ADL) Clinically meaningful improvement in function (CMIF): Sustained CMIF goals met  Plan: Pump refill  today  Pre-op Assessment:  Logan Perez is a 72 y.o. (year old), male patient, seen today for interventional treatment. He  has a past surgical history that includes Knee arthroscopy (Left, 1992); Pain pump implantation (2012); stem cell implant (2008); Cataract extraction w/PHACO (Right, 05/02/2016); Cataract extraction w/PHACO (Left, 05/16/2016); Limbal stem cell transplant; PORTA CATH INSERTION (N/A, 12/04/2016); Colonoscopy with propofol (N/A, 03/22/2017); Esophagogastroduodenoscopy (egd) with propofol (03/22/2017); Eye surgery; Back surgery (1994); and Pain pump implantation (N/A, 09/04/2017). Logan Perez has a current medication list which includes the following prescription(s): acetaminophen, alprazolam, baclofen, bismuth subsalicylate, calcium-vitamin d, cetirizine, vitamin d3, daratumumab, dexamethasone, diltiazem, diltiazem, diphenhydramine, eliquis, ensure, fluticasone, hypromellose, loperamide, loratadine, lovastatin, montelukast, multiple vitamins-iron, naproxen sodium, omeprazole, ondansetron, potassium chloride sa, ranitidine, tamsulosin, vitamin b-12, PAIN MANAGEMENT IT PUMP REFILL, and valacyclovir. His primarily concern today is the Back Pain (lumbar )  Initial Vital Signs:  Pulse/HCG Rate: 62  Temp: 97.9 F (36.6 C) Resp: 16 BP: (!) 143/77 SpO2: 100 %  BMI: Estimated body mass index is 26.79 kg/m as calculated from the following:   Height as of this encounter: '5\' 6"'  (1.676 m).   Weight as of this encounter: 166 lb (75.3 kg).  Risk Assessment: Allergies: Reviewed. He is allergic to azithromycin; bee pollen; pollen extract; zoledronic acid; and rivaroxaban.  Allergy Precautions: None required Coagulopathies: Reviewed. None identified.  Blood-thinner therapy: None at this time Active Infection(s): Reviewed. None identified. Logan Perez is afebrile  Site Confirmation: Logan Perez was asked to confirm the procedure and laterality before marking the site Procedure checklist:  Completed Consent: Before the procedure and under the influence of no sedative(s), amnesic(s), or anxiolytics, the patient was informed of the treatment options, risks and possible complications. To fulfill our ethical and  legal obligations, as recommended by the American Medical Association's Code of Ethics, I have informed the patient of my clinical impression; the nature and purpose of the treatment or procedure; the risks, benefits, and possible complications of the intervention; the alternatives, including doing nothing; the risk(s) and benefit(s) of the alternative treatment(s) or procedure(s); and the risk(s) and benefit(s) of doing nothing.  Logan Perez was provided with information about the general risks and possible complications associated with most interventional procedures. These include, but are not limited to: failure to achieve desired goals, infection, bleeding, organ or nerve damage, allergic reactions, paralysis, and/or death.  In addition, he was informed of those risks and possible complications associated to this particular procedure, which include, but are not limited to: damage to the implant; failure to decrease pain; local, systemic, or serious CNS infections, intraspinal abscess with possible cord compression and paralysis, or life-threatening such as meningitis; bleeding; organ damage; nerve injury or damage with subsequent sensory, motor, and/or autonomic system dysfunction, resulting in transient or permanent pain, numbness, and/or weakness of one or several areas of the body; allergic reactions, either minor or major life-threatening, such as anaphylactic or anaphylactoid reactions.  Furthermore, Logan Perez was informed of those risks and complications associated with the medications. These include, but are not limited to: allergic reactions (i.e.: anaphylactic or anaphylactoid reactions); endorphine suppression; bradycardia and/or hypotension; water retention and/or  peripheral vascular relaxation leading to lower extremity edema and possible stasis ulcers; respiratory depression and/or shortness of breath; decreased metabolic rate leading to weight gain; swelling or edema; medication-induced neural toxicity; particulate matter embolism and blood vessel occlusion with resultant organ, and/or nervous system infarction; and/or intrathecal granuloma formation with possible spinal cord compression and permanent paralysis.  Before refilling the pump Mr. Gutman was informed that some of the medications used in the devise may not be FDA approved for such use and therefore it constitutes an off-label use of the medications.  Finally, he was informed that Medicine is not an exact science; therefore, there is also the possibility of unforeseen or unpredictable risks and/or possible complications that may result in a catastrophic outcome. The patient indicated having understood very clearly. We have given the patient no guarantees and we have made no promises. Enough time was given to the patient to ask questions, all of which were answered to the patient's satisfaction. Mr. Tijerina has indicated that he wanted to continue with the procedure. Attestation: I, the ordering provider, attest that I have discussed with the patient the benefits, risks, side-effects, alternatives, likelihood of achieving goals, and potential problems during recovery for the procedure that I have provided informed consent. Date  Time: 09/11/2018 11:23 AM  Pre-Procedure Preparation:  Monitoring: As per clinic protocol. Respiration, ETCO2, SpO2, BP, heart rate and rhythm monitor placed and checked for adequate function Safety Precautions: Patient was assessed for positional comfort and pressure points before starting the procedure. Time-out: I initiated and conducted the "Time-out" before starting the procedure, as per protocol. The patient was asked to participate by confirming the accuracy of the "Time  Out" information. Verification of the correct person, site, and procedure were performed and confirmed by me, the nursing staff, and the patient. "Time-out" conducted as per Joint Commission's Universal Protocol (UP.01.01.01). Time: 1200  Description of Procedure Process:   Position: Supine Target Area: Central-port of intrathecal pump. Approach: Anterior, 90 degree angle approach. Area Prepped: Entire Area around the pump implant. Prepping solution: ChloraPrep (2% chlorhexidine gluconate and 70% isopropyl alcohol) Safety Precautions: Aspiration looking  for blood return was conducted prior to all injections. At no point did we inject any substances, as a needle was being advanced. No attempts were made at seeking any paresthesias. Safe injection practices and needle disposal techniques used. Medications properly checked for expiration dates. SDV (single dose vial) medications used. Description of the Procedure: Protocol guidelines were followed. Two nurses trained to do implant refills were present during the entire procedure. The refill medication was checked by both healthcare providers as well as the patient. The patient was included in the "Time-out" to verify the medication. The patient was placed in position. The pump was identified. The area was prepped in the usual manner. The sterile template was positioned over the pump, making sure the side-port location matched that of the pump. Both, the pump and the template were held for stability. The needle provided in the Medtronic Kit was then introduced thru the center of the template and into the central port. The pump content was aspirated and discarded volume documented. The new medication was slowly infused into the pump, thru the filter, making sure to avoid overpressure of the device. The needle was then removed and the area cleansed, making sure to leave some of the prepping solution back to take advantage of its long term bactericidal  properties. The pump was interrogated and programmed to reflect the correct medication, volume, and dosage. The program was printed and taken to the physician for approval. Once checked and signed by the physician, a copy was provided to the patient and another scanned into the EMR. Vitals:   09/11/18 1121  BP: (!) 143/77  Pulse: 62  Resp: 16  Temp: 97.9 F (36.6 C)  TempSrc: Oral  SpO2: 100%  Weight: 166 lb (75.3 kg)  Height: '5\' 6"'  (1.676 m)    Start Time: 1201 hrs. End Time: 1218 hrs. Materials & Medications: Medtronic Refill Kit Medication(s): Please see chart orders for details.  Imaging Guidance:          Type of Imaging Technique: None used Indication(s): N/A Exposure Time: No patient exposure Contrast: None used. Fluoroscopic Guidance: N/A Ultrasound Guidance: N/A Interpretation: N/A  Antibiotic Prophylaxis:   Anti-infectives (From admission, onward)   None     Indication(s): None identified  Post-operative Assessment:  Post-procedure Vital Signs:  Pulse/HCG Rate: 62  Temp: 97.9 F (36.6 C) Resp: 16 BP: (!) 143/77 SpO2: 100 %  EBL: None  Complications: No immediate post-treatment complications observed by team, or reported by patient.  Note: The patient tolerated the entire procedure well. A repeat set of vitals were taken after the procedure and the patient was kept under observation following institutional policy, for this type of procedure. Post-procedural neurological assessment was performed, showing return to baseline, prior to discharge. The patient was provided with post-procedure discharge instructions, including a section on how to identify potential problems. Should any problems arise concerning this procedure, the patient was given instructions to immediately contact us, at any time, without hesitation. In any case, we plan to contact the patient by telephone for a follow-up status report regarding this interventional procedure.  Comments:  No  additional relevant information.  Assessment  Primary Diagnosis & Pertinent Problem List: The primary encounter diagnosis was Lumbar spondylosis. Diagnoses of Cancer associated pain, Compression fracture of T12 vertebra, sequela, Presence of intrathecal pump, and Chronic low back pain were also pertinent to this visit.  Status Diagnosis  Controlled Controlled Controlled 1. Lumbar spondylosis   2. Cancer associated pain   3. Compression  fracture of T12 vertebra, sequela   4. Presence of intrathecal pump   5. Chronic low back pain     Problems updated and reviewed during this visit: No problems updated. Plan of Care   Imaging Orders  No imaging studies ordered today   Procedure Orders    No procedure(s) ordered today    Medications ordered for procedure: No orders of the defined types were placed in this encounter.  Medications administered: Lexine Baton "Rick" had no medications administered during this visit.  See the medical record for exact dosing, route, and time of administration.  Disposition: Discharge home  Discharge Date & Time: 09/11/2018;   hrs.   Physician-requested Follow-up: Return in about 3 months (around 12/11/2018).  Future Appointments  Date Time Provider [place]  10/07/2018  9:00 AM [facility] None  10/07/2018  9:30 AM Karen Kitchens, NP [facility] None  10/07/2018 10:00 AM [facility] INFUSION CHAIR 5 [facility] None  10/07/2018  2:30 PM Lucilla Lame, MD [facility] None  12/10/2018 11:00 AM Milinda Pointer, MD [facility] None  05/05/2019  9:00 AM [facility] None  05/07/2019  8:30 AM McGowan, Hunt Oris, PA-C [facility] None   Primary Care Physician: Sofie Hartigan, MD Location: [facility] Note by: Dionisio David, NP Date: 09/11/2018; Time: 7:38 PM  Disclaimer:  Medicine is not an exact science. The only guarantee in medicine is that nothing is guaranteed. It is important to note that the  decision to proceed with this intervention was based on the information collected from the patient. The Data and conclusions were drawn from the patient's questionnaire, the interview, and the physical examination. Because the information was provided in large part by the patient, it cannot be guaranteed that it has not been purposely or unconsciously manipulated. Every effort has been made to obtain as much relevant data as possible for this evaluation. It is important to note that the conclusions that lead to this procedure are derived in large part from the available data. Always take into account that the treatment will also be dependent on availability of resources and existing treatment guidelines, considered by other Pain Management Practitioners as being common knowledge and practice, at the time of the intervention. For Medico-Legal purposes, it is also important to point out that variation in procedural techniques and pharmacological choices are the acceptable norm. The indications, contraindications, technique, and results of the above procedure should only be interpreted and judged by a Board-Certified Interventional Pain Specialist with extensive familiarity and expertise in the same exact procedure and technique.

## 2018-09-11 NOTE — Patient Instructions (Signed)
Opioid Overdose Opioids are substances that relieve pain by binding to pain receptors in your brain and spinal cord. Opioids include illegal drugs, such as heroin, as well as prescription pain medicines.An opioid overdose happens when you take too much of an opioid substance. This can happen with any type of opioid, including:  Heroin.  Morphine.  Codeine.  Methadone.  Oxycodone.  Hydrocodone.  Fentanyl.  Hydromorphone.  Buprenorphine.  The effects of an overdose can be mild, dangerous, or even deadly. Opioid overdose is a medical emergency. What are the causes? This condition may be caused by:  Taking too much of an opioid by accident.  Taking too much of an opioid on purpose.  An error made by a health care provider who prescribes a medicine.  An error made by the pharmacist who fills the prescription order.  Using more than one substance that contains opioids at the same time.  Mixing an opioid with a substance that affects your heart, breathing, or blood pressure. These include alcohol, tranquilizers, sleeping pills, illegal drugs, and some over-the-counter medicines.  What increases the risk? This condition is more likely in:  Children. They may be attracted to colorful pills. Because of a child's small size, even a small amount of a drug can be dangerous.  Elderly people. They may be taking many different drugs. Elderly people may have difficulty reading labels or remembering when they last took their medicine.  People who take an opioid on a long-term basis.  People who use: ? Illegal drugs. ? Other substances, including alcohol, while using an opioid.  People who have: ? A history of drug or alcohol abuse. ? Certain mental health conditions.  People who take opioids that are not prescribed for them.  What are the signs or symptoms? Symptoms of this condition depend on the type of opioid and the amount that was taken. Common symptoms  include:  Sleepiness or difficulty waking from sleep.  Confusion.  Slurred speech.  Slowed breathing and a slow pulse.  Nausea and vomiting.  Abnormally small pupils.  Signs and symptoms that require emergency treatment include:  Cold, clammy, and pale skin.  Blue lips and fingernails.  Vomiting.  Gurgling sounds in the throat.  A pulse that is very slow or difficult to detect.  Breathing that is very slow, noisy, or difficult to detect.  Limp body.  Inability to respond to speech or be awakened from sleep (stupor).  How is this diagnosed? This condition is diagnosed based on your symptoms. It is important to tell your health care provider:  All of the opioidsthat you took.  When you took the opioids.  Whether you were drinking alcohol or using other substances.  Your health care provider will do a physical exam. This exam may include:  Checking and monitoring your heart rate and rhythm, your breathing rate and depth, your temperature, and your blood pressure (vital signs).  Checking for abnormally small pupils.  Measuring oxygen levels in your blood.  You may also have blood tests or urine tests. How is this treated? Supporting your vital signs and your breathing is the first step in treating an opioid overdose. Treatment may also include:  Giving fluids and minerals (electrolytes) through an IV tube.  Inserting a breathing tube (endotracheal tube) in your airway to help you breathe.  Giving oxygen.  Passing a tube through your nose and into your stomach (NG tube, or nasogastric tube) to wash out your stomach.  Giving medicines that: ? Increase your   blood pressure. ? Absorb any opioid that is in your digestive system. ? Reverse the effects of the opioid (naloxone).  Ongoing counseling and mental health support if you intentionally overdosed or used an illegal drug.  Follow these instructions at home:  Take over-the-counter and prescription  medicines only as told by your health care provider. Always ask your health care provider about possible side effects and interactions of any new medicine that you start taking.  Keep a list of all of the medicines that you take, including over-the-counter medicines. Bring this list with you to all of your medical visits.  Drink enough fluid to keep your urine clear or pale yellow.  Keep all follow-up visits as told by your health care provider. This is important. How is this prevented?  Get help if you are struggling with: ? Alcohol or drug use. ? Depression or another mental health problem.  Keep the phone number of your local poison control center near your phone or on your cell phone.  Store all medicines in safety containers that are out of the reach of children.  Read the drug inserts that come with your medicines.  Do not drink alcohol when taking opioids.  Do not use illegal drugs.  Do not take opioid medicines that are not prescribed for you. Contact a health care provider if:  Your symptoms return.  You develop new symptoms or side effects when you are taking medicines. Get help right away if:  You think that you or someone else may have taken too much of an opioid. The hotline of the National Poison Control Center is (800) 222-1222.  You or someone else is having symptoms of an opioid overdose.  You have serious thoughts about hurting yourself or others.  You have: ? Chest pain. ? Difficulty breathing. ? A loss of consciousness. Opioid overdose is an emergency. Do not wait to see if the symptoms will go away. Get medical help right away. Call your local emergency services (911 in the [place] Do not drive yourself to the hospital. This information is not intended to replace advice given to you by your health care provider. Make sure you discuss any questions you have with your health care provider. Document Released: 11/02/2004 Document Revised: 03/02/2016 Document  Reviewed: 03/11/2015 Elsevier Interactive Patient Education  2018 [employer]  

## 2018-09-11 NOTE — Progress Notes (Signed)
Safety precautions to be maintained throughout the outpatient stay will include: orient to surroundings, keep bed in low position, maintain call bell within reach at all times, provide assistance with transfer out of bed and ambulation.  

## 2018-09-16 MED FILL — Medication: INTRATHECAL | Qty: 1 | Status: AC

## 2018-10-06 ENCOUNTER — Other Ambulatory Visit: Payer: Self-pay | Admitting: Urgent Care

## 2018-10-06 DIAGNOSIS — C9001 Multiple myeloma in remission: Secondary | ICD-10-CM

## 2018-10-07 ENCOUNTER — Encounter: Payer: Self-pay | Admitting: Gastroenterology

## 2018-10-07 ENCOUNTER — Ambulatory Visit (INDEPENDENT_AMBULATORY_CARE_PROVIDER_SITE_OTHER): Payer: Medicare Other | Admitting: Gastroenterology

## 2018-10-07 ENCOUNTER — Encounter: Payer: Self-pay | Admitting: Urgent Care

## 2018-10-07 ENCOUNTER — Encounter: Payer: Self-pay | Admitting: Oncology

## 2018-10-07 ENCOUNTER — Inpatient Hospital Stay (HOSPITAL_BASED_OUTPATIENT_CLINIC_OR_DEPARTMENT_OTHER): Payer: Medicare Other | Admitting: Urgent Care

## 2018-10-07 ENCOUNTER — Inpatient Hospital Stay: Payer: Medicare Other

## 2018-10-07 VITALS — BP 136/76 | HR 98 | Temp 98.1°F | Resp 18 | Ht 66.0 in | Wt 167.2 lb

## 2018-10-07 VITALS — BP 139/82 | HR 114 | Ht 66.0 in | Wt 167.4 lb

## 2018-10-07 VITALS — BP 145/88 | HR 84 | Temp 95.0°F | Resp 18

## 2018-10-07 DIAGNOSIS — R05 Cough: Secondary | ICD-10-CM

## 2018-10-07 DIAGNOSIS — I482 Chronic atrial fibrillation, unspecified: Secondary | ICD-10-CM

## 2018-10-07 DIAGNOSIS — Z9484 Stem cells transplant status: Secondary | ICD-10-CM

## 2018-10-07 DIAGNOSIS — C9002 Multiple myeloma in relapse: Secondary | ICD-10-CM

## 2018-10-07 DIAGNOSIS — R197 Diarrhea, unspecified: Secondary | ICD-10-CM

## 2018-10-07 DIAGNOSIS — Z5112 Encounter for antineoplastic immunotherapy: Secondary | ICD-10-CM

## 2018-10-07 DIAGNOSIS — G629 Polyneuropathy, unspecified: Secondary | ICD-10-CM

## 2018-10-07 DIAGNOSIS — L989 Disorder of the skin and subcutaneous tissue, unspecified: Secondary | ICD-10-CM

## 2018-10-07 DIAGNOSIS — M25561 Pain in right knee: Secondary | ICD-10-CM

## 2018-10-07 DIAGNOSIS — C9000 Multiple myeloma not having achieved remission: Secondary | ICD-10-CM

## 2018-10-07 DIAGNOSIS — C9001 Multiple myeloma in remission: Secondary | ICD-10-CM

## 2018-10-07 DIAGNOSIS — I4891 Unspecified atrial fibrillation: Secondary | ICD-10-CM

## 2018-10-07 DIAGNOSIS — J00 Acute nasopharyngitis [common cold]: Secondary | ICD-10-CM

## 2018-10-07 LAB — COMPREHENSIVE METABOLIC PANEL
ALT: 13 U/L (ref 0–44)
AST: 21 U/L (ref 15–41)
Albumin: 3.8 g/dL (ref 3.5–5.0)
Alkaline Phosphatase: 51 U/L (ref 38–126)
Anion gap: 10 (ref 5–15)
BUN: 17 mg/dL (ref 8–23)
CO2: 24 mmol/L (ref 22–32)
Calcium: 8.8 mg/dL — ABNORMAL LOW (ref 8.9–10.3)
Chloride: 104 mmol/L (ref 98–111)
Creatinine, Ser: 1.39 mg/dL — ABNORMAL HIGH (ref 0.61–1.24)
GFR calc Af Amer: 58 mL/min — ABNORMAL LOW (ref >60)
GFR calc non Af Amer: 50 mL/min — ABNORMAL LOW (ref >60)
Glucose, Bld: 137 mg/dL — ABNORMAL HIGH (ref 70–99)
Potassium: 3.8 mmol/L (ref 3.5–5.1)
Sodium: 138 mmol/L (ref 135–145)
Total Bilirubin: 0.7 mg/dL (ref 0.3–1.2)
Total Protein: 6.3 g/dL — ABNORMAL LOW (ref 6.5–8.1)

## 2018-10-07 LAB — CBC WITH DIFFERENTIAL/PLATELET
Abs Immature Granulocytes: 0.03 10*3/uL (ref 0.00–0.07)
Basophils Absolute: 0 10*3/uL (ref 0.0–0.1)
Basophils Relative: 0 %
Eosinophils Absolute: 0.3 10*3/uL (ref 0.0–0.5)
Eosinophils Relative: 4 %
HCT: 34.6 % — ABNORMAL LOW (ref 39.0–52.0)
Hemoglobin: 11.7 g/dL — ABNORMAL LOW (ref 13.0–17.0)
Immature Granulocytes: 0 %
Lymphocytes Relative: 15 %
Lymphs Abs: 1.2 10*3/uL (ref 0.7–4.0)
MCH: 34 pg (ref 26.0–34.0)
MCHC: 33.8 g/dL (ref 30.0–36.0)
MCV: 100.6 fL — ABNORMAL HIGH (ref 80.0–100.0)
Monocytes Absolute: 0.9 10*3/uL (ref 0.1–1.0)
Monocytes Relative: 11 %
Neutro Abs: 5.6 10*3/uL (ref 1.7–7.7)
Neutrophils Relative %: 70 %
Platelets: 175 10*3/uL (ref 150–400)
RBC: 3.44 MIL/uL — ABNORMAL LOW (ref 4.22–5.81)
RDW: 13.3 % (ref 11.5–15.5)
WBC: 8.1 10*3/uL (ref 4.0–10.5)
nRBC: 0 % (ref 0.0–0.2)

## 2018-10-07 LAB — MAGNESIUM: Magnesium: 1.8 mg/dL (ref 1.7–2.4)

## 2018-10-07 MED ORDER — SODIUM CHLORIDE 0.9 % IV SOLN
1200.0000 mg | Freq: Once | INTRAVENOUS | Status: AC
  Administered 2018-10-07: 1200 mg via INTRAVENOUS
  Filled 2018-10-07: qty 60

## 2018-10-07 MED ORDER — HEPARIN SOD (PORK) LOCK FLUSH 100 UNIT/ML IV SOLN
500.0000 [IU] | Freq: Once | INTRAVENOUS | Status: AC
  Administered 2018-10-07: 500 [IU] via INTRAVENOUS
  Filled 2018-10-07: qty 5

## 2018-10-07 MED ORDER — BUDESONIDE 3 MG PO CPEP: 9.0000 mg | ORAL_CAPSULE | ORAL | 6 refills | Status: DC

## 2018-10-07 MED ORDER — SODIUM CHLORIDE 0.9% FLUSH
10.0000 mL | INTRAVENOUS | Status: DC | PRN
  Administered 2018-10-07: 10 mL via INTRAVENOUS
  Filled 2018-10-07: qty 10

## 2018-10-07 MED ORDER — HEPARIN SOD (PORK) LOCK FLUSH 100 UNIT/ML IV SOLN: 500.0000 [IU] | Freq: Once | INTRAVENOUS | Status: DC | PRN

## 2018-10-07 MED ORDER — SODIUM CHLORIDE 0.9 % IV SOLN
Freq: Once | INTRAVENOUS | Status: AC
  Administered 2018-10-07: 11:00:00 via INTRAVENOUS
  Filled 2018-10-07: qty 250

## 2018-10-07 NOTE — Progress Notes (Signed)
[facility] day:  10/07/18  Chief Complaint: Logan Perez is a 72 y.o. 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  HPI: The patient was last seen in the medical oncology clinic on 09/09/2018.  At that time, patient was complaining of superficial pain in his left groin that exacerbated with movement and palpation.  Diarrhea continued; remained on PRN loperamide.  No B symptoms or infections.  Appetite stable; weight up 3 pounds.  Pain well controlled.  Exam revealed mild LLQ abdominal pain.  Additionally, he had some nonspecific lesions to his right temporoparietal scalp; seen dermatology.  WBC 7200 ([place] 4400).  Hemoglobin 11.8, hematocrit 34.9, MCV 101.5, and platelets 165,000.  BUN 18 and creatinine 1.33 mg/dL.  Kappa lambda light chain ratio elevated at 1.66.  Myeloma panel revealed an IgG monoclonal protein with kappa light chain specificity.  M spike 0.   He received cycle #22 daratumumab on 09/09/2018.  Symptomatically, patient has had mild "cold symptoms" since Friday.  Patient has experienced coryza and a productive cough.  He denies fevers.  Sputum production initially yellow in color, however has improved significantly and is now clear.  Patient denies any sore throat or myalgias.  No nausea or vomiting.  Symptoms have improved with rest, fluids, and symptomatic treatment since their onset on 10/04/2018. 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   Diarrhea persists, however patient continues to use loperamide on a PRN basis. Patient is scheduled to see gastroenterology Allen Norris, MD) in consult later today.  Patient makes complaint of RIGHT knee pain.  Patient advises that he was cleaning gutters on Friday and he "twisted" his knee.  Patient presents today in a knee  brace.  No swelling observed.  Patient complains of minor tenderness associated with palpation of his knee, as well as with ambulation.  He does not experience any difficulty with weightbearing activities.  Patient advises that he maintains an adequate appetite. He is eating well. Weight today is 167 lb 3.2 oz (75.8 kg), which compared to his last visit to the clinic, represents a stable weight.     Patient denies pain in the clinic today.   Past Medical History:  Diagnosis Date  . Anxiety   . Atrial fibrillation ([place])   . BPH (benign prostatic hyperplasia)   . Complication of anesthesia    BAD HEADACHE NIGHT OF FIRST CATARACT  . Compression fracture of lumbar vertebra ([place])   . Difficulty voiding   . Dysrhythmia    A FIB  . Elevated PSA   . GERD (gastroesophageal reflux disease)   . Hearing aid worn    bilateral  . History of kidney stones   . HLD (hyperlipidemia)   . HOH (hard of hearing)   . Hypertension   . Multiple myeloma ([place])   . Neuropathy    feet. 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  . Pain    BACK  . Palpitations   . Pneumonia   . Pulmonary embolism ([place])   . Sepsis ([place])   . Stroke [facility])    TIA, detected on CT scan. pt was unaware    Past Surgical History:  Procedure Laterality Date  . BACK SURGERY  1994  . CATARACT EXTRACTION W/PHACO Right 05/02/2016   Procedure: CATARACT EXTRACTION PHACO AND INTRAOCULAR LENS PLACEMENT (IOC);  Surgeon:  Birder Robson, MD;  Location: [facility] ORS;  Service: Ophthalmology;  Laterality: Right;  [place] 1.06 AP% 20.6 CDE 13.70 FLUID PACK LOT # [id number]  . CATARACT EXTRACTION W/PHACO Left 05/16/2016   Procedure: CATARACT EXTRACTION PHACO AND INTRAOCULAR LENS PLACEMENT (IOC);  Surgeon: Birder Robson, MD;  Location: [facility] ORS;  Service: Ophthalmology;  Laterality: Left;  [place] 01:43 AP% 19.8 CDE 20.45 FLUID PACK LOT #[id number] H  . COLONOSCOPY WITH PROPOFOL N/A 03/22/2017   Procedure: COLONOSCOPY WITH PROPOFOL;  Surgeon: Lucilla Lame, MD;  Location:  [place];  Service: Endoscopy;  Laterality: N/A;  has port  . ESOPHAGOGASTRODUODENOSCOPY (EGD) WITH PROPOFOL  03/22/2017   Procedure: ESOPHAGOGASTRODUODENOSCOPY (EGD) WITH PROPOFOL;  Surgeon: Lucilla Lame, MD;  Location: [place];  Service: Endoscopy;;  . EYE SURGERY    . KNEE ARTHROSCOPY Left 1992  . LIMBAL STEM CELL TRANSPLANT    . PAIN PUMP IMPLANTATION  2012  . PAIN PUMP IMPLANTATION N/A 09/04/2017   Procedure: INTRATHECAL PUMP BATTERY CHANGE;  Surgeon: Milinda Pointer, MD;  Location: [facility] ORS;  Service: Neurosurgery;  Laterality: N/A;  . PORTA CATH INSERTION N/A 12/04/2016   Procedure: Glori Luis Cath Insertion;  Surgeon: Algernon Huxley, MD;  Location: [place] [facility];  Service: Cardiovascular;  Laterality: N/A;  . stem cell implant  2008   [facility]    Family History  Problem Relation Age of Onset  . Cancer Father        throat  . Kidney disease Sister   . Stroke Unknown   . Stroke Mother   . Bladder Cancer Neg Hx   . Prostate cancer Neg Hx   . Kidney cancer Neg Hx     Social History:  reports that he quit smoking about 43 years ago. His smoking use included cigarettes. He has a 30.00 pack-year smoking history. He has never used smokeless tobacco. He reports that he does not drink alcohol or use drugs.  He has a wire haired dachshund.  He lives in [place].  His wife's name is [name].  The patient is alone today.  Allergies:  Allergies  Allergen Reactions  . Azithromycin Diarrhea and Other (See Comments)    Possible cause of C. Diff Possible cause of C. Diff Possible cause of C. Diff Possible cause of C. Diff  . Bee Pollen Other (See Comments)    Sneezing, watery eyes, runny nose  . Pollen Extract Other (See Comments)    Sneezing, watery eyes, runny nose  . 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 Rivaroxaban Rash    Current Medications: Current Outpatient Medications  Medication Sig Dispense  Refill  . acetaminophen (TYLENOL) 325 MG tablet Take 650 mg every 6 (six) hours as needed by mouth for mild pain or headache.     . ALPRAZolam (XANAX) 0.25 MG tablet TAKE 1 TABLET BY MOUTH EVERY NIGHT AT BEDTIME 30 tablet 0  . baclofen (LIORESAL) 10 MG tablet Take 1 tablet (10 mg total) by mouth 2 (two) times daily. 60 tablet 5  . bismuth subsalicylate (PEPTO BISMOL) 262 MG/15ML suspension Take 30 mLs by mouth every 6 (six) hours as needed for diarrhea or loose stools.    . Calcium Carb-Cholecalciferol (CALCIUM-VITAMIN D) 500-400 MG-UNIT TABS Take 1 tablet daily by mouth.    . cetirizine (ZYRTEC) 10 MG tablet Take 10 mg by mouth daily as needed for allergies.     . Cholecalciferol (VITAMIN D3) 2000 units capsule  Take 2,000 Units daily by mouth.     . Daratumumab (DARZALEX IV) Inject 1,200 mg every 30 (thirty) days into the vein.     [name] dexamethasone (DECADRON) 4 MG tablet TAKE 4 TABLETS BY MOUTH 1 HOUR TO PRIOR TO INFUSION THEN TAKE 1 TABLET ON DAY 2 AND 3 30 tablet 0  . diltiazem (CARDIZEM CD) 120 MG 24 hr capsule Take 120 mg daily by mouth.     . diltiazem (CARDIZEM) 60 MG tablet Take 60 mg daily as needed by mouth (for increased heart rate > 140).     [name] diphenhydrAMINE (BENADRYL) 25 MG tablet Take 25 mg by mouth as directed. With chemo treatment    . ELIQUIS 5 MG TABS tablet TAKE 1 TABLET BY MOUTH TWICE DAILY 60 tablet 0  . ENSURE (ENSURE) Take 237 mLs by mouth daily.    . fluticasone (FLONASE) 50 MCG/ACT nasal spray Place 1 spray into the nose daily as needed for allergies.     . Hypromellose (ARTIFICIAL TEARS OP) Place 1 drop as needed into both eyes (for dry eyes).    [name] loperamide (IMODIUM) 2 MG capsule Take 4 mg as needed by mouth for diarrhea or loose stools.     [name] loratadine (CLARITIN) 10 MG tablet Take 10 mg by mouth daily.    [name] lovastatin (MEVACOR) 20 MG tablet Take 20 mg by mouth every evening.     . montelukast (SINGULAIR) 10 MG tablet TAKE 1 TABLET BY MOUTH DAILY THE DAY BEFORE THE  INFUSION, THEN DAY OF AND THE 2 DAYS AFTER INFUSION 30 tablet 0  . Multiple Vitamins-Iron (MULTIVITAMIN/IRON PO) Take 1 tablet daily by mouth.    . naproxen sodium (ALEVE) 220 MG tablet Take 220 mg by mouth.    [name] omeprazole (PRILOSEC) 20 MG capsule Take 20 mg by mouth daily.     . ondansetron (ZOFRAN) 8 MG tablet Take 1 tablet (8 mg total) by mouth as directed. Take with chemo and can take it as needed 20 tablet 1  . PAIN MANAGEMENT IT PUMP REFILL 1 each by Intrathecal route once for 1 dose. Medication: PF Fentanyl 1,500.0 mcg/ml PF Bupivicaine 30.0 mg/ml PF Clonidine 300.0 mcg/ml Total Volume: 40 ml Needed by 09-11-18 @ 1000 1 each 0  . potassium chloride SA (K-DUR,KLOR-CON) 20 MEQ tablet TAKE 1 TABLET BY MOUTH DAILY 90 tablet 0  . ranitidine (ZANTAC) 150 MG tablet Take 150 mg by mouth at bedtime.     . tamsulosin (FLOMAX) 0.4 MG CAPS capsule Take 1 capsule (0.4 mg total) by mouth daily. 90 capsule 4  . valACYclovir (VALTREX) 500 MG tablet TAKE 1 TABLET BY MOUTH DAILY (Patient not taking: Reported on 08/08/2018) 30 tablet 0  . vitamin B-12 (CYANOCOBALAMIN) 1000 MCG tablet Take 1,000 mcg daily by mouth.      No current facility-administered medications for this visit.     Review of Systems  Constitutional: Negative for diaphoresis, fever, malaise/fatigue and weight loss (stable).  HENT: Positive for congestion (very mild) and hearing loss (hearing aids). Negative for sore throat.        Coryza  Eyes: Negative.   Respiratory: Positive for cough and sputum production (yellow initially; now clear). Negative for hemoptysis and shortness of breath.   Cardiovascular: Negative for chest pain, palpitations, orthopnea, leg swelling and PND.       PMH (+) A.fib  Gastrointestinal: Positive for diarrhea and nausea. Negative for abdominal pain, blood in stool, constipation, melena and vomiting.  Genitourinary: Negative  for dysuria, frequency, hematuria and urgency.  Musculoskeletal: Positive for back  pain (chronic; implanted Baclofen pump) and joint pain (RIGHT knee pain). Negative for falls and myalgias.  Skin: Negative for itching and rash.  Neurological: Positive for sensory change (stable neuropathy in fingers and toes). Negative for dizziness, tremors, weakness and headaches.  Endo/Heme/Allergies: Does not bruise/bleed easily.  Psychiatric/Behavioral: Negative for depression, memory loss and suicidal ideas. The patient is not nervous/anxious and does not have insomnia.   All other systems reviewed and are negative.  Performance status (ECOG): 1 - Symptomatic but completely ambulatory  Vital signs BP 136/76 (Patient Position: Sitting)   Pulse 98   Temp 98.1 F (36.7 C) (Tympanic)   Resp 18   Ht '5\' 6"'  (1.676 m)   Wt 167 lb 3.2 oz (75.8 kg)   SpO2 96%   BMI 26.99 kg/m   Physical Exam  Constitutional: He is oriented to person, place, and time and well-developed, well-nourished, and in no distress.  HENT:  Head: Normocephalic and atraumatic.  Right Ear: Decreased hearing is noted.  Left Ear: Decreased hearing is noted.  Nose: Rhinorrhea present. No sinus tenderness.  Mouth/Throat: Oropharynx is clear and moist and mucous membranes are normal.  Eyes: Pupils are equal, round, and reactive to light. EOM are normal. No scleral icterus.  Neck: Normal range of motion. Neck supple. No tracheal deviation present. No thyromegaly present.  Cardiovascular: Normal rate, regular rhythm, normal heart sounds and intact distal pulses. Exam reveals no gallop and no friction rub.  No murmur heard. Pulmonary/Chest: Effort normal and breath sounds normal. No respiratory distress. He has no wheezes. He has no rales.  Abdominal: Soft. Bowel sounds are normal. He exhibits no distension. There is no abdominal tenderness.  Musculoskeletal: Normal range of motion.        General: No edema.     Right knee: He exhibits normal range of motion, no swelling, no ecchymosis, no deformity, normal alignment,  no LCL laxity, normal patellar mobility, normal meniscus and no MCL laxity. Tenderness found.     Thoracic back: He exhibits pain (chronic).     Lumbar back: He exhibits pain (chronic).     Comments: Implanted Baclofen pump in place  Lymphadenopathy:    He has no cervical adenopathy.    He has no axillary adenopathy.       Right: No inguinal and no supraclavicular adenopathy present.       Left: No inguinal and no supraclavicular adenopathy present.  Neurological: He is alert and oriented to person, place, and time.  Skin: Skin is warm and dry. No rash noted. No erythema.  Skin graft to tip of nose following Moh's surgery. Left forehead lesion. Light brown < 1 cm scalp lesion.  Small area of ecchymosis dorsum left hand.  Psychiatric: Mood, affect and judgment normal.  Nursing note and vitals reviewed.    Infusion on 10/07/2018  Component Date Value Ref Range Status  . Magnesium 10/07/2018 1.8  1.7 - 2.4 mg/dL Final   Performed at [facility], [address] [place], [place] [address]  . IgG (Immunoglobin G), Serum 10/07/2018 317* 700 - 1,600 mg/dL Final  . IgA 10/07/2018 38* 61 - 437 mg/dL Final   Result confirmed on concentration.  . IgM (Immunoglobulin M), Srm 10/07/2018 26  15 - 143 mg/dL Final  . Total Protein ELP 10/07/2018 5.8* 6.0 - 8.5 g/dL Corrected  . Albumin SerPl Elph-Mcnc 10/07/2018 3.9  2.9 - 4.4 g/dL Corrected  . Alpha  1 10/07/2018 0.2  0.0 - 0.4 g/dL Corrected  . Alpha2 Glob SerPl Elph-Mcnc 10/07/2018 0.7  0.4 - 1.0 g/dL Corrected  . B-Globulin SerPl Elph-Mcnc 10/07/2018 0.6* 0.7 - 1.3 g/dL Corrected  . Gamma Glob SerPl Elph-Mcnc 10/07/2018 0.3* 0.4 - 1.8 g/dL Corrected  . M Protein SerPl Elph-Mcnc 10/07/2018 Not Observed  Not Observed g/dL Corrected  . Globulin, Total 10/07/2018 1.9* 2.2 - 3.9 g/dL Corrected  . Albumin/Glob SerPl 10/07/2018 2.1* 0.7 - 1.7 Corrected  . IFE 1 10/07/2018 Comment   Corrected   Comment: (NOTE) Immunofixation shows IgG  monoclonal protein with kappa light chain specificity. Please note that samples from patients receiving DARZALEX(R) (daratumumab) treatment can appear as an "IgG kappa" and mask a complete response. If this patient is receiving DARA, this IFE assay interference can be removed by ordering test number [id number]-"Immunofixation, Daratumumab-Specific, Serum" and submitting a new sample for testing or by calling the lab to add this test to the current sample. Immunofixation shows IgA monoclonal protein with lambda light chain specificity.   . Please Note 10/07/2018 Comment   Corrected   Comment: (NOTE) Protein electrophoresis scan will follow via computer, mail, or courier delivery. Performed At: [facility] [place], [place] [id number] Rush Farmer MD SV:[id number]   . Sodium 10/07/2018 138  135 - 145 mmol/L Final  . Potassium 10/07/2018 3.8  3.5 - 5.1 mmol/L Final  . Chloride 10/07/2018 104  98 - 111 mmol/L Final  . CO2 10/07/2018 24  22 - 32 mmol/L Final  . Glucose, Bld 10/07/2018 137* 70 - 99 mg/dL Final  . BUN 10/07/2018 17  8 - 23 mg/dL Final  . Creatinine, Ser 10/07/2018 1.39* 0.61 - 1.24 mg/dL Final  . Calcium 10/07/2018 8.8* 8.9 - 10.3 mg/dL Final  . Total Protein 10/07/2018 6.3* 6.5 - 8.1 g/dL Final  . Albumin 10/07/2018 3.8  3.5 - 5.0 g/dL Final  . AST 10/07/2018 21  15 - 41 U/L Final  . ALT 10/07/2018 13  0 - 44 U/L Final  . Alkaline Phosphatase 10/07/2018 51  38 - 126 U/L Final  . Total Bilirubin 10/07/2018 0.7  0.3 - 1.2 mg/dL Final  . GFR calc non Af Amer 10/07/2018 50* >60 mL/min Final  . GFR calc Af Amer 10/07/2018 58* >60 mL/min Final  . Anion gap 10/07/2018 10  5 - 15 Final   Performed at [facility], [address], [place], [place] [address]  . WBC 10/07/2018 8.1  4.0 - 10.5 K/uL Final  . RBC 10/07/2018 3.44* 4.22 - 5.81 MIL/uL Final  . Hemoglobin 10/07/2018 11.7* 13.0 - 17.0 g/dL Final  . HCT 10/07/2018 34.6* 39.0 - 52.0 % Final   . MCV 10/07/2018 100.6* 80.0 - 100.0 fL Final  . MCH 10/07/2018 34.0  26.0 - 34.0 pg Final  . MCHC 10/07/2018 33.8  30.0 - 36.0 g/dL Final  . RDW 10/07/2018 13.3  11.5 - 15.5 % Final  . Platelets 10/07/2018 175  150 - 400 K/uL Final  . nRBC 10/07/2018 0.0  0.0 - 0.2 % Final  . Neutrophils Relative % 10/07/2018 70  % Final  . Neutro Abs 10/07/2018 5.6  1.7 - 7.7 K/uL Final  . Lymphocytes Relative 10/07/2018 15  % Final  . Lymphs Abs 10/07/2018 1.2  0.7 - 4.0 K/uL Final  . Monocytes Relative 10/07/2018 11  % Final  . Monocytes Absolute 10/07/2018 0.9  0.1 - 1.0 K/uL Final  . Eosinophils Relative 10/07/2018 4  %  Final  . Eosinophils Absolute 10/07/2018 0.3  0.0 - 0.5 K/uL Final  . Basophils Relative 10/07/2018 0  % Final  . Basophils Absolute 10/07/2018 0.0  0.0 - 0.1 K/uL Final  . Immature Granulocytes 10/07/2018 0  % Final  . Abs Immature Granulocytes 10/07/2018 0.03  0.00 - 0.07 K/uL Final   Performed at [facility], [address] [place], [place] [address]    Assessment:  Logan Perez is a 72 y.o. male with stage III mutiple myeloma.  He initially presented with progressive back pain beginning in 12/2006.  MRI revealed "spots and compression fractures".  He began Velcade, thalidomide, and Decadron.  In 08/2007, he underwent high dose chemotherapy and autologous stem cell transplant.  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  He recurred with a rising M-spike (2.7) with repeat M spike (1.7 gm/dl) in 03/2010.  He was initially treated with Velcade (02/08/2010 - 05/10/2010).  He then began Revlimid (15 mg 3 weeks on/1 week off) and Decadron (40 mg on day 1, 8, 15, 22).  Because of significant side effect with Decadron his dose was decreased to 10 mg once a week in 07/2010.    He was on maintenance Revlimid. Revlimid was initially10 mg 3 weeks on/1 week off. This was changed to 10 mg 2 weeks on/2 weeks off secondary to right nipple tenderness. His  dose was increased to 10 mg 3 weeks on/1 week off with Decadron 10 mg a week (on Sundays) and then Revlamid 15 mg 3 weeks on and 1 week off with Decadron on Sundays.  He began Pomalyst 4 mg 3 weeks on/1 week off with Decadron on 08/27/2015.  Over the past year his SPEP has revealed no monoclonal protein (04/21/2015) and 0.5 gm/dL on 09/22/2015 and 10/20/2015. M spike was 0.1 on 02/02/2016, 03/01/2016, 03/29/2016, 04/26/2016, and 05/24/2016.  M spike was 0 on 10/11/2016, 11/28/2016, 02/05/2017, 03/21/2017, 08/02/2017, 10/04/2017, 12/27/2017, 01/24/2018, and 02/21/2018.  M spike was 0.1 on 11/01/2017, 0.1 on 11/29/2017, 0 on 12/27/2017, 0 on 01/24/2018, 0 on 02/21/2018, 0.1 on 03/21/2018, 0.3 on 04/18/2018, 0.1 on 05/16/2018, 0 on 06/13/2018, 0.2 on 08/08/2018, and 0 on 09/09/2018.  Free light chains have been monitored. Kappa free light chains were 18.54 on 11/21/2013, 18.37 on 02/20/2014, 18.93 on 05/22/2014, 32.58 (high; normal ratio 1.27) on 08/21/2014, 51.53 (high; elevated ratio of 2.12) on 11/13/2014, 28.08 (ratio 1.73) on 12/09/2014, 23.71 (ratio 2.17) on 01/18/2015, 92.93 (ratio 9.49) on 04/21/2015, 93.44 (ratio 10.28) on 05/26/2015, 255.45 (ratio of 24.05) on 07/14/2015, 373.89 (ratio 48.31) on 08/04/2015, 474.33 (ratio 70.58) on 08/25/2015, 450.76 (ratio 55.44) on 09/22/2015, 453.4 (ratio 59.89) on 10/20/2015, 58.26 (ratio of > 40.74) on 02/02/2016, 62.67 (ratio of 37.98) on 03/01/2016, 75.7 (ratio > 50.47) on 03/29/2016, 79.7 (ratio 53.13) on 04/26/2016, 108.6 (ratio > 72.4) on 05/24/2016, 8.3 (1.46 ratio) on 10/11/2016, 6.7 (0.81 ratio) on 02/05/2017, 7.8 (1.01 ratio) on 03/21/2017,7.6 (ratio 0.63) on 06/01/2017, 8.1 (ratio 1.13) on 11/29/2017, 9.6 (ratio 1.55) on 06/13/2018, 6.6 (ratio 2.06) on 07/11/2018, 6.9 (ratio 0.82) on 08/08/2018, and 5.8 (ratio 1.66) on 09/09/2018.  Bone survey on 12/08/2014 was stable.  Bone survey on 10/21/2015 revealed increase conspicuity of subcentimeter lytic  lesions in the calvarium.  Bone marrow aspirate and biopsy on 11/04/2015 revealed an atypical monoclonal plasma cells estimated at 30-40% of marrow cells.   Marrow was variably cellular (approximately 45%) with background trilineage hematopoiesis. There was no significant increase in marrow reticulin fibers. Storage iron was present.  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 He develoed herpes zoster in 04/2008. He developed a pulmonary embolism in 05/2013. He was initially on Xarelto, but is now on Eliquis. He had an episode of pneumonia around this time requiring a brief admission. He developed severe lower leg cramps on 08/07/2014 secondary to hypokalemia. Duplex was negative.   He was treated for C difficile colitis (Flagyl completed 07/30/2015).  He has a chronic indwelling pain pump.  He received 4 cycles of Pomalyst and Decadron (08/27/2015 - 11/19/2015).  Restaging studies document progressive disease.  Kappa free light chains are increasing.  SPEP revealed 0.5 gm/dL monoclonal protein then 1.3 gm/dL.  Bone survey reveals increase conspicuity of subcentimeter lytic lesions in the calvarium.  Bone marrow reveals 30-40% plasma cells.   MUGA on 11/30/2015 revealed an ejection fraction of 46%.  He is felt not to be a good candidate for Kyprolis.  There were no focal wall motion abnormalities. He had a stress echo less than 1 year ago.  He has a history of PVCs and atrial fibrillation.  He takes Cardizem prn.  He received 17 weeks of daratumumab (Darzalex) (12/09/2015 - 05/25/2016).  He tolerated treatment well without side effect.  Bone marrow on 02/09/2016 revealed no diagnostic morphologic evidence of plasma cell myeloma.  Marrow was normocellular to hypocellular marrow for age (ranging from 10-40%) with maturing trilineage hematopoiesis and mild multilineage dyspoiesis.  There was patchy mild increase in reticulin.  Storage iron  was present.  Flow cytometry revealed no definitive evidence of monoclonality.  There was a non-specific atypical myeloid and monocytic findings with no increase in blasts.  Cytogenetics were normal (46, XY).  He is currently 28 months s/p 2nd autologous stem cell transplant on 06/16/2016.  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  He also experienced atrial fibrillation with intermittent episodes of RVR requiring IV beta blockers.  He is on prophylactic valacyclovir for 1 year post transplant.  He started vaccinations (DTaP-Pediatric triple vaccine, Hep B- Pediatrix triple vaccine, Haemophilus influenza B (Hib), inactivated polio virus (IPV), pneumococcal conjugate vaccine 13-valent (PCV 13)) on 04/17/2017.  Recommendation was for Shingrix vaccine to be given in [place] and second dose of Shingrix in 2 months.  He had follow-up vaccinations on 08/28/2017.  He had his second shingles vaccine. He received 18 month vaccinations - DTaP-Pediatric triple vaccine, Hep B- Pediatrix triple vaccine, Haemophilus influenza B (Hib), inactivated polio virus (IPV), pneumococcal conjugate vaccine 13-valent (PCV 13) on 02/05/2018.  He is s/p 22 cycles of Daratumumab (Darzalex) post transplant (12/05/2016 - 09/09/2018).  PET scan on 08/14/2018 revealed no evidence of active myeloma on whole-body FDG PET scan.  There was no evidence soft tissue plasmacytoma.  There was no clear evidence of lytic lesions on the CT portion exam. Some lucencies in the pelvis were stable. There were chronic compression fractures in the lower thoracic spine.  He has a history of hypomagnesemia that required IV magnesium (2-4 gm) weekly.  He has not required magnesium since 10/24/2016.  Symptomatically, has has very mild cold symptoms today. He denies any fevers. Prodctive cough; improving. Bowels are stable on PRN loperamide. Complains of RIGHT knee pain s/p "twisting" injury on  12/27/2019l present in brace. Exam reveals coryza. Mld tenderness with palpation to RIGHT knee.  WBC 8100 ([place] 5600).  Hemoglobin 11.7, hematocrit 34.6, MCV 100.6, platelets 175,000.  BUN 17 and creatinine 1.39 mg/dL (CrCl 43.3 mL/min).  Myeloma panel reveals an IgG monoclonal protein with kappa light chain specificity.   Plan:  1. Labs today CBC with differential, CMP, myeloma panel, Mg 2. Multiple myeloma Doing well overall. Tolerating treatments with minimal side effects. Myeloma panel stable.  Labs reviewed. Blood counts stable and adequate enough for treatment. Will proceed with cycle #23 daratumumab. Took pre-medications at home prior to coming in due to associated cost of clinic administration. Discuss symptom management.  Patient has antiemetics and pain medications at home to use on a PRN basis. Patient  advising that the  prescribed interventions are adequate at this point. Continue all medications as previously prescribed.  3. Diarrhea  Persistent and long standing issue.   Worse after pre-treatment steroids.   Scheduled to see gastroenterology Allen Norris, MD) in consult later today.  Continue PRN use of loperamide.  Encouraged to incorporate ORS (electrolyte enriched fluids) into daily intake to prevent dehydration and electrolyte derangements. 4. Common cold symptoms  Very mild.  Improved since onset of symptoms on 10/04/2018.   Patient has appropriately increased oral fluid intake, is resting, and utilizing OTC medications as needed for symptomatic relief.  No fevers.  Patient to contact the clinic with any concerns of worsening symptoms. 5. RIGHT knee pain  Pain s/p twisting knee x 1 week ago.  Wearing brace.  Offered diagnostic plain films, however patient declined.  Offered referral to orthopedics for further evaluation. Patient elects to "hold off for now". Will call clinic for worsening pain.  6. 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  He notes that his neuropathy does not impose any functional limitations, nor does it impede his ability to ambulate safely. 7. Atrial fibrillation  Rate controlled atrial fibrillation with no recent episodes of RVR.  Continues on chronic anticoagulation (apixaban) therapy.  Patient followed by cardiology. 8. Skin lesions  Stable.  Followed by dermatology. 9. RTC in 4 weeks for MD assessment, labs (CBC with diff, CMP, Mg, SPEP, FLCA), and cycle #24 daratumumab.   Honor Loh 10/07/18, 8:47 AM

## 2018-10-07 NOTE — Progress Notes (Signed)
Primary Care Physician: Sofie Hartigan, MD  Primary Gastroenterologist:  Dr. Lucilla Lame  Chief Complaint  Patient presents with  . Follow up diarrhea    HPI: Logan Perez is a 72 y.o. male here For follow-up of his change in bowel habits.  The patient had a colonoscopy that showed him to have inflammation in the colon and the terminal ileum.  The patient then had blood work done for an IBD panel which was positive.  He states that he gets Decadron intermittently from hematology and states that after he receives that he has no diarrhea for approximate 4 days.  After that his diarrhea returns.  The patient has not known any food to exacerbate his symptoms  Current Outpatient Medications  Medication Sig Dispense Refill  . acetaminophen (TYLENOL) 325 MG tablet Take 650 mg every 6 (six) hours as needed by mouth for mild pain or headache.     . ALPRAZolam (XANAX) 0.25 MG tablet TAKE 1 TABLET BY MOUTH EVERY NIGHT AT BEDTIME 30 tablet 0  . baclofen (LIORESAL) 10 MG tablet Take 1 tablet (10 mg total) by mouth 2 (two) times daily. 60 tablet 5  . bismuth subsalicylate (PEPTO BISMOL) 262 MG/15ML suspension Take 30 mLs by mouth every 6 (six) hours as needed for diarrhea or loose stools.    . Calcium Carb-Cholecalciferol (CALCIUM-VITAMIN D) 500-400 MG-UNIT TABS Take 1 tablet daily by mouth.    . cetirizine (ZYRTEC) 10 MG tablet Take 10 mg by mouth daily as needed for allergies.     . Cholecalciferol (VITAMIN D3) 2000 units capsule Take 2,000 Units daily by mouth.     . Daratumumab (DARZALEX IV) Inject 1,200 mg every 30 (thirty) days into the vein.     [name] diltiazem (CARDIZEM CD) 120 MG 24 hr capsule Take 120 mg daily by mouth.     . diltiazem (CARDIZEM) 60 MG tablet Take 60 mg daily as needed by mouth (for increased heart rate > 140).     [name] diphenhydrAMINE (BENADRYL) 25 MG tablet Take 25 mg by mouth as directed. With chemo treatment    . ELIQUIS 5 MG TABS tablet TAKE 1 TABLET BY MOUTH  TWICE DAILY 60 tablet 0  . ENSURE (ENSURE) Take 237 mLs by mouth daily.    . fluticasone (FLONASE) 50 MCG/ACT nasal spray Place 1 spray into the nose daily as needed for allergies.     . Hypromellose (ARTIFICIAL TEARS OP) Place 1 drop as needed into both eyes (for dry eyes).    [name] loperamide (IMODIUM) 2 MG capsule Take 4 mg as needed by mouth for diarrhea or loose stools.     [name] loratadine (CLARITIN) 10 MG tablet Take 10 mg by mouth daily.    [name] lovastatin (MEVACOR) 20 MG tablet Take 20 mg by mouth every evening.     . Multiple Vitamins-Iron (MULTIVITAMIN/IRON PO) Take 1 tablet daily by mouth.    . naproxen sodium (ALEVE) 220 MG tablet Take 220 mg by mouth.    [name] omeprazole (PRILOSEC) 20 MG capsule Take 20 mg by mouth daily.     . potassium chloride SA (K-DUR,KLOR-CON) 20 MEQ tablet TAKE 1 TABLET BY MOUTH DAILY 90 tablet 0  . ranitidine (ZANTAC) 150 MG tablet Take 150 mg by mouth at bedtime.     . tamsulosin (FLOMAX) 0.4 MG CAPS capsule Take 1 capsule (0.4 mg total) by mouth daily. 90 capsule 4  . vitamin B-12 (CYANOCOBALAMIN) 1000 MCG tablet Take 1,000 mcg daily  by mouth.     . dexamethasone (DECADRON) 4 MG tablet TAKE 4 TABLETS BY MOUTH 1 HOUR TO PRIOR TO INFUSION THEN TAKE 1 TABLET ON DAY 2 AND 3 (Patient not taking: Reported on 10/07/2018) 30 tablet 0  . montelukast (SINGULAIR) 10 MG tablet TAKE 1 TABLET BY MOUTH DAILY THE DAY BEFORE THE INFUSION, THEN DAY OF AND THE 2 DAYS AFTER INFUSION (Patient not taking: Reported on 10/07/2018) 30 tablet 0  . ondansetron (ZOFRAN) 8 MG tablet Take 1 tablet (8 mg total) by mouth as directed. Take with chemo and can take it as needed (Patient not taking: Reported on 10/07/2018) 20 tablet 1  . PAIN MANAGEMENT IT PUMP REFILL 1 each by Intrathecal route once for 1 dose. Medication: PF Fentanyl 1,500.0 mcg/ml PF Bupivicaine 30.0 mg/ml PF Clonidine 300.0 mcg/ml Total Volume: 40 ml Needed by 09-11-18 @ 1000 1 each 0  . valACYclovir (VALTREX) 500 MG tablet TAKE 1  TABLET BY MOUTH DAILY (Patient not taking: Reported on 08/08/2018) 30 tablet 0   No current facility-administered medications for this visit.     Allergies as of 10/07/2018 - Review Complete 10/07/2018  Allergen Reaction Noted  . Azithromycin Diarrhea and Other (See Comments) 11/10/2015  . Bee pollen Other (See Comments) 02/05/2017  . Pollen extract Other (See Comments) 02/05/2017  . Zoledronic acid Other (See Comments) 04/26/2015  . Rivaroxaban Rash 04/26/2015    ROS:  General: Negative for anorexia, weight loss, fever, chills, fatigue, weakness. ENT: Negative for hoarseness, difficulty swallowing , nasal congestion. CV: Negative for chest pain, angina, palpitations, dyspnea on exertion, peripheral edema.  Respiratory: Negative for dyspnea at rest, dyspnea on exertion, cough, sputum, wheezing.  GI: See history of present illness. GU:  Negative for dysuria, hematuria, urinary incontinence, urinary frequency, nocturnal urination.  Endo: Negative for unusual weight change.    Physical Examination:   BP 139/82   Pulse (!) 114   Ht 5\' 6"  (1.676 m)   Wt 167 lb 6.4 oz (75.9 kg)   BMI 27.02 kg/m   General: Well-nourished, well-developed in no acute distress.  Eyes: No icterus. Conjunctivae pink. Mouth: Oropharyngeal mucosa moist and pink , no lesions erythema or exudate. Lungs: Clear to auscultation bilaterally. Non-labored. Heart: Regular rate and rhythm, no murmurs rubs or gallops.  Abdomen: Bowel sounds are normal, nontender, nondistended, no hepatosplenomegaly or masses, no abdominal bruits or hernia , no rebound or guarding.   Extremities: No lower extremity edema. No clubbing or deformities. Neuro: Alert and oriented x 3.  Grossly intact. Skin: Warm and dry, no jaundice.   Psych: Alert and cooperative, normal mood and affect.  Labs:    Imaging Studies: No results found.  Assessment and Plan:   Logan Perez is a 72 y.o. y/o male With a history of  inflammation seen in terminal ileum and colon on biopsies with a IBD panel suggestive of possible Crohn's disease.  The patient will be started on budesonide to see if this helps his symptoms.  The patient will take 9 mg per day.  The patient has been explained the plan and agrees with it.    Lucilla Lame, MD. Marval Regal   Note: This dictation was prepared with Dragon dictation along with smaller phrase technology. Any transcriptional errors that result from this process are unintentional.

## 2018-10-09 ENCOUNTER — Other Ambulatory Visit: Payer: Self-pay | Admitting: Urgent Care

## 2018-10-09 DIAGNOSIS — C9001 Multiple myeloma in remission: Secondary | ICD-10-CM

## 2018-10-10 LAB — MULTIPLE MYELOMA PANEL, SERUM
Albumin SerPl Elph-Mcnc: 3.9 g/dL (ref 2.9–4.4)
Albumin/Glob SerPl: 2.1 — ABNORMAL HIGH (ref 0.7–1.7)
Alpha 1: 0.2 g/dL (ref 0.0–0.4)
Alpha2 Glob SerPl Elph-Mcnc: 0.7 g/dL (ref 0.4–1.0)
B-Globulin SerPl Elph-Mcnc: 0.6 g/dL — ABNORMAL LOW (ref 0.7–1.3)
Gamma Glob SerPl Elph-Mcnc: 0.3 g/dL — ABNORMAL LOW (ref 0.4–1.8)
Globulin, Total: 1.9 g/dL — ABNORMAL LOW (ref 2.2–3.9)
IgA: 38 mg/dL — ABNORMAL LOW (ref 61–437)
IgG (Immunoglobin G), Serum: 317 mg/dL — ABNORMAL LOW (ref 700–1600)
IgM (Immunoglobulin M), Srm: 26 mg/dL (ref 15–143)
Total Protein ELP: 5.8 g/dL — ABNORMAL LOW (ref 6.0–8.5)

## 2018-10-15 ENCOUNTER — Ambulatory Visit: Payer: Medicare Other | Admitting: Nurse Practitioner

## 2018-10-16 ENCOUNTER — Inpatient Hospital Stay: Payer: Medicare Other | Attending: Hematology and Oncology

## 2018-10-16 ENCOUNTER — Other Ambulatory Visit: Payer: Self-pay

## 2018-10-16 DIAGNOSIS — I493 Ventricular premature depolarization: Secondary | ICD-10-CM | POA: Insufficient documentation

## 2018-10-16 DIAGNOSIS — Z5112 Encounter for antineoplastic immunotherapy: Secondary | ICD-10-CM | POA: Insufficient documentation

## 2018-10-16 DIAGNOSIS — C9002 Multiple myeloma in relapse: Secondary | ICD-10-CM | POA: Insufficient documentation

## 2018-10-16 DIAGNOSIS — C9001 Multiple myeloma in remission: Secondary | ICD-10-CM

## 2018-10-17 LAB — UPEP/TP, 24-HR URINE
Albumin, U: 43.1 %
Alpha 1, Urine: 3.8 %
Alpha 2, Urine: 15.1 %
Beta, Urine: 27.3 %
Gamma Globulin, Urine: 10.7 %
Total Protein, Urine-Ur/day: 85 mg/24 hr (ref 30–150)
Total Protein, Urine: 8.5 mg/dL
Total Volume: 1000

## 2018-11-02 NOTE — Progress Notes (Signed)
[facility] day:  11/04/18  Chief Complaint: Logan Perez is a 73 y.o. 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  HPI: The patient was last seen in the medical oncology clinic on 10/07/2018 by Honor Loh, NP.  At that time, he had very mild cold symptoms. He denied any fevers. Prodctive cough; improving. Bowels were stable on PRN loperamide. Complained of RIGHT knee pain s/p "twisting" injury on 10/04/2018; presented in brace. Exam revealed coryza. Mild tenderness with palpation to RIGHT knee.  WBC was 8100 ([place] 5600).  Hemoglobin was 11.7, hematocrit 34.6, MCV 100.6, platelets 175,000.  BUN was 17 and creatinine 1.39 mg/dL (CrCl 43.3 mL/min).  M-spike was 0 with an IgG monoclonal protein with kappa light chain specificity.  IgG was 317.  He received cycle #23 daratumumab on 10/07/2018.  24 hour urine on 10/16/2018 revealed no monoclonal protein.  During the interim, patient complains of numbness in his BILATERAL lower extremities. He likens it to a previous experience when his Baclofen pump was "overmedicating him" back in 2012.  Patient has plans to contact the pain clinic today.   Patient continues to have issues with his bowels. He was started on Budesonide on 10/10/2018, which has not really been all that effective. Patient continues to require PRN loperamide dosing.   Pain in RIGHT knee has improved. He notes that it is "99% back to normal". Patient is no longer wearing a brace.   Patient notes that he continues to have some rhinorrhea related to his seasonal allergies. No recent fevers or infections. Patient advises that he maintains an adequate appetite. He is eating well. Weight today is 167 lb 1.7 oz (75.8 kg), which compared to his last visit to the clinic, represents a stable weight.     Patient denies pain in the clinic today.   Past Medical History:  Diagnosis Date  . Anxiety   . Atrial fibrillation ([place])   . BPH (benign prostatic hyperplasia)   . Complication of anesthesia    BAD HEADACHE NIGHT OF FIRST CATARACT  . Compression fracture of lumbar vertebra ([place])   . Difficulty voiding   . Dysrhythmia    A FIB  . Elevated PSA   . GERD (gastroesophageal reflux disease)   . Hearing aid worn    bilateral  . History of kidney stones   . HLD (hyperlipidemia)   . HOH (hard of hearing)   . Hypertension   . Multiple myeloma ([place])   . Neuropathy    feet. 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  . Pain    BACK  . Palpitations   . Pneumonia   . Pulmonary embolism ([place])   . Sepsis ([place])   . Stroke [facility])    TIA, detected on CT scan. pt was unaware    Past Surgical History:  Procedure Laterality Date  . BACK SURGERY  1994  . CATARACT EXTRACTION W/PHACO Right 05/02/2016   Procedure: CATARACT EXTRACTION PHACO AND INTRAOCULAR LENS PLACEMENT (IOC);  Surgeon: Birder Robson, MD;  Location: [facility] ORS;  Service: Ophthalmology;  Laterality: Right;  [place] 1.06 AP% 20.6 CDE 13.70 FLUID PACK LOT # [id number]  . CATARACT EXTRACTION W/PHACO Left 05/16/2016   Procedure: CATARACT EXTRACTION PHACO AND INTRAOCULAR LENS PLACEMENT (IOC);  Surgeon: Birder Robson, MD;  Location: [facility] ORS;  Service: Ophthalmology;  Laterality: Left;  [place]  01:43 AP% 19.8 CDE 20.45 FLUID PACK LOT #[id number] H  . COLONOSCOPY WITH PROPOFOL N/A 03/22/2017   Procedure: COLONOSCOPY WITH PROPOFOL;  Surgeon: Lucilla Lame, MD;  Location: [facility];  Service: Endoscopy;  Laterality: N/A;  has port  . ESOPHAGOGASTRODUODENOSCOPY (EGD) WITH PROPOFOL  03/22/2017   Procedure: ESOPHAGOGASTRODUODENOSCOPY (EGD) WITH PROPOFOL;  Surgeon: Lucilla Lame, MD;  Location: [facility];  Service: Endoscopy;;  . EYE SURGERY    . KNEE ARTHROSCOPY Left 1992  . LIMBAL STEM CELL TRANSPLANT    . PAIN PUMP IMPLANTATION  2012  . PAIN PUMP  IMPLANTATION N/A 09/04/2017   Procedure: INTRATHECAL PUMP BATTERY CHANGE;  Surgeon: Milinda Pointer, MD;  Location: [facility] OR[facility];  Service: Neurosurgery;  Laterality: N/A;  . PORTA CATH INSERTION N/A 12/04/2016   Procedure: Glori Luis Cath Insertion;  Surgeon: Algernon Huxley, MD;  Location: [facility];  Service: Cardiovascular;  Laterality: N/A;  . stem cell implant  2008   [facility]    Family History  Problem Relation Age of Onset  . Cancer Father        throat  . Kidney disease Sister   . Stroke Other   . Stroke Mother   . Bladder Cancer Neg Hx   . Prostate cancer Neg Hx   . Kidney cancer Neg Hx     Social History:  reports that he quit smoking about 43 years ago. His smoking use included cigarettes. He has a 30.00 pack-year smoking history. He has never used smokeless tobacco. He reports that he does not drink alcohol or use drugs.  He has a wire haired dachshund.  He lives in [place].  His wife's name is [name].  The patient is alone today.  Allergies:  Allergies  Allergen Reactions  . Azithromycin Diarrhea and Other (See Comments)    Possible cause of C. Diff Possible cause of C. Diff Possible cause of C. Diff Possible cause of C. Diff  . Bee Pollen Other (See Comments)    Sneezing, watery eyes, runny nose  . Pollen Extract Other (See Comments)    Sneezing, watery eyes, runny nose  . 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 Rivaroxaban Rash    Current Medications: Current Outpatient Medications  Medication Sig Dispense Refill  . acetaminophen (TYLENOL) 325 MG tablet Take 650 mg every 6 (six) hours as needed by mouth for mild pain or headache.     . ALPRAZolam (XANAX) 0.25 MG tablet Take 1 tablet (0.25 mg total) by mouth at bedtime as needed. 30 tablet 0  . baclofen (LIORESAL) 10 MG tablet Take 1 tablet (10 mg total) by mouth 2 (two) times daily. 60 tablet 5  . bismuth subsalicylate (PEPTO BISMOL) 262 MG/15ML  suspension Take 30 mLs by mouth every 6 (six) hours as needed for diarrhea or loose stools.    . budesonide (ENTOCORT EC) 3 MG 24 hr capsule Take 3 capsules (9 mg total) by mouth every morning. 90 capsule 6  . Calcium Carb-Cholecalciferol (CALCIUM-VITAMIN D) 500-400 MG-UNIT TABS Take 1 tablet daily by mouth.    . cetirizine (ZYRTEC) 10 MG tablet Take 10 mg by mouth daily as needed for allergies.     . Cholecalciferol (VITAMIN D3) 2000 units capsule Take 2,000 Units daily by mouth.     . Daratumumab (DARZALEX IV) Inject 1,200 mg every 30 (thirty) days into the vein.     [name] dexamethasone (DECADRON) 4 MG tablet  TAKE 4 TABLETS BY MOUTH 1 HOUR TO PRIOR TO INFUSION THEN TAKE 1 TABLET ON DAY 2 AND 3 30 tablet 0  . diltiazem (CARDIZEM CD) 120 MG 24 hr capsule Take 120 mg daily by mouth.     . diltiazem (CARDIZEM) 60 MG tablet Take 60 mg daily as needed by mouth (for increased heart rate > 140).     [name] diphenhydrAMINE (BENADRYL) 25 MG tablet Take 25 mg by mouth as directed. With chemo treatment    . ELIQUIS 5 MG TABS tablet TAKE 1 TABLET BY MOUTH TWICE DAILY 60 tablet 0  . ENSURE (ENSURE) Take 237 mLs by mouth daily.    . fluticasone (FLONASE) 50 MCG/ACT nasal spray Place 1 spray into the nose daily as needed for allergies.     . Hypromellose (ARTIFICIAL TEARS OP) Place 1 drop as needed into both eyes (for dry eyes).    [name] loperamide (IMODIUM) 2 MG capsule Take 4 mg as needed by mouth for diarrhea or loose stools.     [name] loratadine (CLARITIN) 10 MG tablet Take 10 mg by mouth daily.    [name] lovastatin (MEVACOR) 20 MG tablet Take 20 mg by mouth every evening.     . montelukast (SINGULAIR) 10 MG tablet TAKE 1 TABLET BY MOUTH DAILY THE DAY BEFORE THE INFUSION, THEN DAY OF AND THE 2 DAYS AFTER INFUSION 30 tablet 0  . Multiple Vitamins-Iron (MULTIVITAMIN/IRON PO) Take 1 tablet daily by mouth.    [name] omeprazole (PRILOSEC) 20 MG capsule Take 20 mg by mouth daily.     . ondansetron (ZOFRAN) 8 MG tablet Take 1 tablet (8 mg  total) by mouth as directed. Take with chemo and can take it as needed 20 tablet 1  . PAIN MANAGEMENT IT PUMP REFILL 1 each by Intrathecal route once for 1 dose. Medication: PF Fentanyl 1,500.0 mcg/ml PF Bupivicaine 30.0 mg/ml PF Clonidine 300.0 mcg/ml Total Volume: 40 ml Needed by 09-11-18 @ 1000 1 each 0  . potassium chloride SA (K-DUR,KLOR-CON) 20 MEQ tablet TAKE 1 TABLET BY MOUTH DAILY 90 tablet 0  . tamsulosin (FLOMAX) 0.4 MG CAPS capsule Take 1 capsule (0.4 mg total) by mouth daily. 90 capsule 4  . vitamin B-12 (CYANOCOBALAMIN) 1000 MCG tablet Take 1,000 mcg daily by mouth.      No current facility-administered medications for this visit.    Facility-Administered Medications Ordered in Other Visits  Medication Dose Route Frequency Provider Last Rate Last Dose  . heparin lock flush 100 unit/mL  500 Units Intravenous Once ,  C, MD      . sodium chloride flush (NS) 0.9 % injection 10 mL  10 mL Intravenous PRN Lequita Asal, MD   10 mL at 11/04/18 0940    Review of Systems  Constitutional: Negative for diaphoresis, fever, malaise/fatigue and weight loss (stable).  HENT: Negative.  Negative for congestion, ear pain, nosebleeds, sinus pain and sore throat.        Rhinorrhea  Eyes: Negative.  Negative for blurred vision, double vision, photophobia and pain.  Respiratory: Negative for cough, hemoptysis, sputum production and shortness of breath.   Cardiovascular: Negative for chest pain, palpitations, orthopnea, leg swelling and PND.       PMH (+) A.fib  Gastrointestinal: Positive for diarrhea (chronic; now on Budesonide) and nausea (controlled). Negative for abdominal pain, blood in stool, constipation, melena and vomiting.  Genitourinary: Negative for dysuria, frequency, hematuria and urgency.  Musculoskeletal: Positive for back pain (chronic; implanted Baclofen pump) and  joint pain (RIGHT knee pain; "99% back to normal"). Negative for falls and myalgias.  Skin:  Negative for itching and rash.  Neurological: Positive for sensory change (BILATERAL lower extremities. Stable neuropathy in fingers and toes). Negative for dizziness, tremors, weakness and headaches.  Endo/Heme/Allergies: Positive for environmental allergies (seasonal allergies). Does not bruise/bleed easily.  Psychiatric/Behavioral: Negative for depression, memory loss and suicidal ideas. The patient is not nervous/anxious and does not have insomnia.   All other systems reviewed and are negative.  Performance status (ECOG): 1 - Symptomatic but completely ambulatory  Vital signs BP 136/82 (BP Location: Left Arm, Patient Position: Sitting)   Pulse 82   Temp 97.8 F (36.6 C) (Oral)   Resp 16   Wt 167 lb 1.7 oz (75.8 kg)   SpO2 100%   BMI 26.97 kg/m   Physical Exam  Constitutional: He is oriented to person, place, and time and well-developed, well-nourished, and in no distress. No distress.  HENT:  Head: Normocephalic and atraumatic.  Nose: Rhinorrhea present. Right sinus exhibits no maxillary sinus tenderness and no frontal sinus tenderness. Left sinus exhibits no maxillary sinus tenderness and no frontal sinus tenderness.  Mouth/Throat: Oropharynx is clear and moist and mucous membranes are normal. No oropharyngeal exudate.  Short brown hair with graying goatee.  Eyes: Pupils are equal, round, and reactive to light. Conjunctivae and EOM are normal. No scleral icterus.  Neck: Normal range of motion. Neck supple. No JVD present.  Cardiovascular: Normal rate, regular rhythm, normal heart sounds and intact distal pulses. Exam reveals no gallop and no friction rub.  No murmur heard. Pulmonary/Chest: Effort normal and breath sounds normal. No respiratory distress. He has no wheezes. He has no rales.  Abdominal: Soft. Bowel sounds are normal. He exhibits no distension and no mass. There is no abdominal tenderness. There is no rebound and no guarding.  Right sided palpable pain pump.   Musculoskeletal: Normal range of motion.        General: No tenderness or edema.     Thoracic back: He exhibits pain (chronic).     Lumbar back: He exhibits pain (chronic).  Lymphadenopathy:    He has no cervical adenopathy.    He has no axillary adenopathy.       Right: No inguinal and no supraclavicular adenopathy present.       Left: No inguinal and no supraclavicular adenopathy present.  Neurological: He is alert and oriented to person, place, and time.  Skin: Skin is warm and dry. No rash noted. He is not diaphoretic. No erythema. No pallor.  Skin graft to tip of nose following Moh's surgery.  Psychiatric: Mood, affect and judgment normal.  Nursing note and vitals reviewed.    Infusion on 11/04/2018  Component Date Value Ref Range Status  . Magnesium 11/04/2018 1.8  1.7 - 2.4 mg/dL Final   Performed at [facility], [address] [place], [place] [address]  . Sodium 11/04/2018 136  135 - 145 mmol/L Final  . Potassium 11/04/2018 3.6  3.5 - 5.1 mmol/L Final  . Chloride 11/04/2018 102  98 - 111 mmol/L Final  . CO2 11/04/2018 24  22 - 32 mmol/L Final  . Glucose, Bld 11/04/2018 149* 70 - 99 mg/dL Final  . BUN 11/04/2018 20  8 - 23 mg/dL Final  . Creatinine, Ser 11/04/2018 1.36* 0.61 - 1.24 mg/dL Final  . Calcium 11/04/2018 9.1  8.9 - 10.3 mg/dL Final  . Total Protein 11/04/2018 6.3* 6.5 - 8.1 g/dL  Final  . Albumin 11/04/2018 3.9  3.5 - 5.0 g/dL Final  . AST 11/04/2018 21  15 - 41 U/L Final  . ALT 11/04/2018 15  0 - 44 U/L Final  . Alkaline Phosphatase 11/04/2018 46  38 - 126 U/L Final  . Total Bilirubin 11/04/2018 0.6  0.3 - 1.2 mg/dL Final  . GFR calc non Af Amer 11/04/2018 52* >60 mL/min Final  . GFR calc Af Amer 11/04/2018 60* >60 mL/min Final  . Anion gap 11/04/2018 10  5 - 15 Final   Performed at [facility], [address], [place], [place] [address]  . WBC 11/04/2018 8.4  4.0 - 10.5 K/uL Final  . RBC 11/04/2018 3.67* 4.22 - 5.81 MIL/uL  Final  . Hemoglobin 11/04/2018 12.5* 13.0 - 17.0 g/dL Final  . HCT 11/04/2018 37.1* 39.0 - 52.0 % Final  . MCV 11/04/2018 101.1* 80.0 - 100.0 fL Final  . MCH 11/04/2018 34.1* 26.0 - 34.0 pg Final  . MCHC 11/04/2018 33.7  30.0 - 36.0 g/dL Final  . RDW 11/04/2018 13.7  11.5 - 15.5 % Final  . Platelets 11/04/2018 170  150 - 400 K/uL Final  . nRBC 11/04/2018 0.0  0.0 - 0.2 % Final  . Neutrophils Relative % 11/04/2018 61  % Final  . Neutro Abs 11/04/2018 5.1  1.7 - 7.7 K/uL Final  . Lymphocytes Relative 11/04/2018 30  % Final  . Lymphs Abs 11/04/2018 2.5  0.7 - 4.0 K/uL Final  . Monocytes Relative 11/04/2018 7  % Final  . Monocytes Absolute 11/04/2018 0.6  0.1 - 1.0 K/uL Final  . Eosinophils Relative 11/04/2018 1  % Final  . Eosinophils Absolute 11/04/2018 0.1  0.0 - 0.5 K/uL Final  . Basophils Relative 11/04/2018 1  % Final  . Basophils Absolute 11/04/2018 0.0  0.0 - 0.1 K/uL Final  . Immature Granulocytes 11/04/2018 0  % Final  . Abs Immature Granulocytes 11/04/2018 0.02  0.00 - 0.07 K/uL Final   Performed at [facility], [address], [place], [place] [address]    Assessment:  Osceola Depaz is a 73 y.o. male with stage III mutiple myeloma.  He initially presented with progressive back pain beginning in 12/2006.  MRI revealed "spots and compression fractures".  He began Velcade, thalidomide, and Decadron.  In 08/2007, he underwent high dose chemotherapy and autologous stem cell transplant.  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  He recurred with a rising M-spike (2.7) with repeat M spike (1.7 gm/dl) in 03/2010.  He was initially treated with Velcade (02/08/2010 - 05/10/2010).  He then began Revlimid (15 mg 3 weeks on/1 week off) and Decadron (40 mg on day 1, 8, 15, 22).  Because of significant side effect with Decadron his dose was decreased to 10 mg once a week in 07/2010.    He was on maintenance Revlimid. Revlimid was initially10 mg 3  weeks on/1 week off. This was changed to 10 mg 2 weeks on/2 weeks off secondary to right nipple tenderness. His dose was increased to 10 mg 3 weeks on/1 week off with Decadron 10 mg a week (on Sundays) and then Revlamid 15 mg 3 weeks on and 1 week off with Decadron on Sundays.  He began Pomalyst 4 mg 3 weeks on/1 week off with Decadron on 08/27/2015.  Over the past year his SPEP has revealed no monoclonal protein (04/21/2015) and 0.5 gm/dL on 09/22/2015 and 10/20/2015. M spike was 0.1 on  02/02/2016, 03/01/2016, 03/29/2016, 04/26/2016, and 05/24/2016.  M spike was 0 on 10/11/2016, 11/28/2016, 02/05/2017, 03/21/2017, 08/02/2017, 10/04/2017, 12/27/2017, 01/24/2018, and 02/21/2018.  M spike was 0.1 on 11/01/2017, 0.1 on 11/29/2017, 0 on 12/27/2017, 0 on 01/24/2018, 0 on 02/21/2018, 0.1 on 03/21/2018, 0.3 on 04/18/2018, 0.1 on 05/16/2018, 0 on 06/13/2018, 0.2 on 08/08/2018, 0 on 09/09/2018, and 0.1 on 11/04/2018.  Free light chains have been monitored. Kappa free light chains were 18.54 on 11/21/2013, 18.37 on 02/20/2014, 18.93 on 05/22/2014, 32.58 (high; normal ratio 1.27) on 08/21/2014, 51.53 (high; elevated ratio of 2.12) on 11/13/2014, 28.08 (ratio 1.73) on 12/09/2014, 23.71 (ratio 2.17) on 01/18/2015, 92.93 (ratio 9.49) on 04/21/2015, 93.44 (ratio 10.28) on 05/26/2015, 255.45 (ratio of 24.05) on 07/14/2015, 373.89 (ratio 48.31) on 08/04/2015, 474.33 (ratio 70.58) on 08/25/2015, 450.76 (ratio 55.44) on 09/22/2015, 453.4 (ratio 59.89) on 10/20/2015, 58.26 (ratio of > 40.74) on 02/02/2016, 62.67 (ratio of 37.98) on 03/01/2016, 75.7 (ratio > 50.47) on 03/29/2016, 79.7 (ratio 53.13) on 04/26/2016, 108.6 (ratio > 72.4) on 05/24/2016, 8.3 (1.46 ratio) on 10/11/2016, 6.7 (0.81 ratio) on 02/05/2017, 7.8 (1.01 ratio) on 03/21/2017,7.6 (ratio 0.63) on 06/01/2017, 8.1 (ratio 1.13) on 11/29/2017, 9.6 (ratio 1.55) on 06/13/2018, 6.6 (ratio 2.06) on 07/11/2018, 6.9 (ratio 0.82) on 08/08/2018, 5.8 (ratio 1.66) on  09/09/2018, and 5.8 (ratio 1.05) on 11/04/2018.  Bone survey on 12/08/2014 was stable.  Bone survey on 10/21/2015 revealed increase conspicuity of subcentimeter lytic lesions in the calvarium.  Bone marrow aspirate and biopsy on 11/04/2015 revealed an atypical monoclonal plasma cells estimated at 30-40% of marrow cells.   Marrow was variably cellular (approximately 45%) with background trilineage hematopoiesis. There was no significant increase in marrow reticulin fibers. Storage iron was present.    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 He develoed herpes zoster in 04/2008. He developed a pulmonary embolism in 05/2013. He was initially on Xarelto, but is now on Eliquis. He had an episode of pneumonia around this time requiring a brief admission. He developed severe lower leg cramps on 08/07/2014 secondary to hypokalemia. Duplex was negative.   He was treated for C difficile colitis (Flagyl completed 07/30/2015).  He has a chronic indwelling pain pump.  He received 4 cycles of Pomalyst and Decadron (08/27/2015 - 11/19/2015).  Restaging studies document progressive disease.  Kappa free light chains are increasing.  SPEP revealed 0.5 gm/dL monoclonal protein then 1.3 gm/dL.  Bone survey reveals increase conspicuity of subcentimeter lytic lesions in the calvarium.  Bone marrow reveals 30-40% plasma cells.   MUGA on 11/30/2015 revealed an ejection fraction of 46%.  He is felt not to be a good candidate for Kyprolis.  There were no focal wall motion abnormalities. He had a stress echo less than 1 year ago.  He has a history of PVCs and atrial fibrillation.  He takes Cardizem prn.  He received 17 weeks of daratumumab (Darzalex) (12/09/2015 - 05/25/2016).  He tolerated treatment well without side effect.  Bone marrow on 02/09/2016 revealed no diagnostic morphologic evidence of plasma cell myeloma.  Marrow was normocellular to hypocellular  marrow for age (ranging from 10-40%) with maturing trilineage hematopoiesis and mild multilineage dyspoiesis.  There was patchy mild increase in reticulin.  Storage iron was present.  Flow cytometry revealed no definitive evidence of monoclonality.  There was a non-specific atypical myeloid and monocytic findings with no increase in blasts.  Cytogenetics were normal (46, XY).  He is currently 29 months s/p 2nd autologous stem cell  transplant on 06/16/2016.  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  He also experienced atrial fibrillation with intermittent episodes of RVR requiring IV beta blockers.  He is on prophylactic valacyclovir for 1 year post transplant.  He started vaccinations (DTaP-Pediatric triple vaccine, Hep B- Pediatrix triple vaccine, Haemophilus influenza B (Hib), inactivated polio virus (IPV), pneumococcal conjugate vaccine 13-valent (PCV 13)) on 04/17/2017.  Recommendation was for Shingrix vaccine to be given in [place] and second dose of Shingrix in 2 months.  He had follow-up vaccinations on 08/28/2017.  He had his second shingles vaccine. He received 18 month vaccinations - DTaP-Pediatric triple vaccine, Hep B- Pediatrix triple vaccine, Haemophilus influenza B (Hib), inactivated polio virus (IPV), pneumococcal conjugate vaccine 13-valent (PCV 13) on 02/05/2018.  He is s/p 23 cycles of Daratumumab (Darzalex) post transplant (12/05/2016 - 10/07/2018).  PET scan on 08/14/2018 revealed no evidence of active myeloma on whole-body FDG PET scan.  There was no evidence soft tissue plasmacytoma.  There was no clear evidence of lytic lesions on the CT portion exam. Some lucencies in the pelvis were stable. There were chronic compression fractures in the lower thoracic spine.  He has a history of hypomagnesemia that required IV magnesium (2-4 gm) weekly.  He has not required magnesium since 10/24/2016.  Symptomatically, he is doing well.  He denies any acute concerns. RIGHT knee pain has improved. He continues to have allergy related rhinorrhea. Chronic back pain is controlled with implanted baclofen bump. Bowels continees to be loose; using loperamide.  Exam remains stable and unchanged from previous. WBC 8400 ([place] 5100). Creatinine 1.36 mg/dL. Estimated Creatinine Clearance: 44.3 mL/min (A) (by C-G formula based on SCr of 1.36 mg/dL (H)).  M-spike is 0.1 gm/dL.  Plan:  1. Labs today :  CBC with diff, CMP, SPEP, FLCA, Mg. 2. Multiple myeloma  Doing well.  Tolerating treatments with minimal side effects.  SPEP fluctuating between 0-0.1 gm/dL.  Kappa free light chain ratio is normal.  Labs reviewed. Blood counts stable and adequate enough for treatment. Will proceed with cycle #24 daratumumab, Patient took pre-medications at home prior to coming in due to associated cost of clinic administration.  Patient has antiemetics and pain medications at home to use on a PRN basis, and advises that the prescribed interventions are adequately managing her symptoms at this point. Continue all medications as previously prescribed.  3. Acute numbness to BILATERAL lower extremities  He has experienced similar sensation in the past, at which time pain pump was "overmedicating" him.   Able to safely ambulate.   Encouraged to contact pain clinic today for further investigation of potential Baclofen pump issue.  4. Chronic diarrhea  Persistent despite recently prescribed Budesonide.   Experiences transient exacerbation after pretreatment steroids.  Patient followed by gastroenterology Allen Norris, MD).   Continue PRN use of loperamide.  Encouraged to incorporate ORS into daily intake to prevent dehydration and electrolyte derangements. 5. 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 He notes that his neuropathy does not impose any functional limitations, nor does it impede his ability to ambulate safely. 6. Atrial  fibrillation  Rate controlled A.fib with no recent episodes of RVR.   Continues on chronic anticoagulation (apixaban) therapy.  Followed regularly by cardiology.  7. RIGHT knee pain  Improved. "99% back to normal".  No longer wearing brace.  8. Skin lesions  Stable. No changes.   Followed by dermatology.  9. RTC in 4 weeks for MD  assessment, labs (CBC with diff, CMP, Mg, SPEP), and cycle #25 daratumumab.   Honor Loh 11/04/18, 10:09 AM   I saw and evaluated the patient, participating in the key portions of the service and reviewing pertinent diagnostic studies and records.  I reviewed the nurse practitioner's note and agree with the findings and the plan.  The assessment and plan were discussed with the patient.  Several questions were asked by the patient and answered.   Nolon Stalls, MD 11/04/18, 10:09 AM

## 2018-11-04 ENCOUNTER — Encounter: Payer: Self-pay | Admitting: Hematology and Oncology

## 2018-11-04 ENCOUNTER — Inpatient Hospital Stay: Payer: Medicare Other

## 2018-11-04 ENCOUNTER — Inpatient Hospital Stay (HOSPITAL_BASED_OUTPATIENT_CLINIC_OR_DEPARTMENT_OTHER): Payer: Medicare Other | Admitting: Hematology and Oncology

## 2018-11-04 VITALS — BP 136/82 | HR 82 | Temp 97.8°F | Resp 16 | Wt 167.1 lb

## 2018-11-04 DIAGNOSIS — C9000 Multiple myeloma not having achieved remission: Secondary | ICD-10-CM

## 2018-11-04 DIAGNOSIS — Z7901 Long term (current) use of anticoagulants: Secondary | ICD-10-CM

## 2018-11-04 DIAGNOSIS — C9002 Multiple myeloma in relapse: Secondary | ICD-10-CM | POA: Diagnosis not present

## 2018-11-04 DIAGNOSIS — L989 Disorder of the skin and subcutaneous tissue, unspecified: Secondary | ICD-10-CM

## 2018-11-04 DIAGNOSIS — Z7189 Other specified counseling: Secondary | ICD-10-CM

## 2018-11-04 DIAGNOSIS — Z87891 Personal history of nicotine dependence: Secondary | ICD-10-CM

## 2018-11-04 DIAGNOSIS — G629 Polyneuropathy, unspecified: Secondary | ICD-10-CM

## 2018-11-04 DIAGNOSIS — R209 Unspecified disturbances of skin sensation: Secondary | ICD-10-CM | POA: Diagnosis not present

## 2018-11-04 DIAGNOSIS — I4891 Unspecified atrial fibrillation: Secondary | ICD-10-CM

## 2018-11-04 DIAGNOSIS — Z5112 Encounter for antineoplastic immunotherapy: Secondary | ICD-10-CM | POA: Diagnosis not present

## 2018-11-04 DIAGNOSIS — R197 Diarrhea, unspecified: Secondary | ICD-10-CM

## 2018-11-04 DIAGNOSIS — Z9484 Stem cells transplant status: Secondary | ICD-10-CM

## 2018-11-04 LAB — CBC WITH DIFFERENTIAL/PLATELET
Abs Immature Granulocytes: 0.02 10*3/uL (ref 0.00–0.07)
Basophils Absolute: 0 10*3/uL (ref 0.0–0.1)
Basophils Relative: 1 %
Eosinophils Absolute: 0.1 10*3/uL (ref 0.0–0.5)
Eosinophils Relative: 1 %
HCT: 37.1 % — ABNORMAL LOW (ref 39.0–52.0)
Hemoglobin: 12.5 g/dL — ABNORMAL LOW (ref 13.0–17.0)
Immature Granulocytes: 0 %
Lymphocytes Relative: 30 %
Lymphs Abs: 2.5 10*3/uL (ref 0.7–4.0)
MCH: 34.1 pg — ABNORMAL HIGH (ref 26.0–34.0)
MCHC: 33.7 g/dL (ref 30.0–36.0)
MCV: 101.1 fL — ABNORMAL HIGH (ref 80.0–100.0)
Monocytes Absolute: 0.6 10*3/uL (ref 0.1–1.0)
Monocytes Relative: 7 %
Neutro Abs: 5.1 10*3/uL (ref 1.7–7.7)
Neutrophils Relative %: 61 %
Platelets: 170 10*3/uL (ref 150–400)
RBC: 3.67 MIL/uL — ABNORMAL LOW (ref 4.22–5.81)
RDW: 13.7 % (ref 11.5–15.5)
WBC: 8.4 10*3/uL (ref 4.0–10.5)
nRBC: 0 % (ref 0.0–0.2)

## 2018-11-04 LAB — COMPREHENSIVE METABOLIC PANEL
ALT: 15 U/L (ref 0–44)
AST: 21 U/L (ref 15–41)
Albumin: 3.9 g/dL (ref 3.5–5.0)
Alkaline Phosphatase: 46 U/L (ref 38–126)
Anion gap: 10 (ref 5–15)
BUN: 20 mg/dL (ref 8–23)
CO2: 24 mmol/L (ref 22–32)
Calcium: 9.1 mg/dL (ref 8.9–10.3)
Chloride: 102 mmol/L (ref 98–111)
Creatinine, Ser: 1.36 mg/dL — ABNORMAL HIGH (ref 0.61–1.24)
GFR calc Af Amer: 60 mL/min — ABNORMAL LOW (ref >60)
GFR calc non Af Amer: 52 mL/min — ABNORMAL LOW (ref >60)
Glucose, Bld: 149 mg/dL — ABNORMAL HIGH (ref 70–99)
Potassium: 3.6 mmol/L (ref 3.5–5.1)
Sodium: 136 mmol/L (ref 135–145)
Total Bilirubin: 0.6 mg/dL (ref 0.3–1.2)
Total Protein: 6.3 g/dL — ABNORMAL LOW (ref 6.5–8.1)

## 2018-11-04 LAB — MAGNESIUM: Magnesium: 1.8 mg/dL (ref 1.7–2.4)

## 2018-11-04 MED ORDER — SODIUM CHLORIDE 0.9 % IV SOLN
Freq: Once | INTRAVENOUS | Status: AC
  Administered 2018-11-04: 11:00:00 via INTRAVENOUS
  Filled 2018-11-04: qty 250

## 2018-11-04 MED ORDER — SODIUM CHLORIDE 0.9% FLUSH
10.0000 mL | INTRAVENOUS | Status: DC | PRN
  Administered 2018-11-04: 10 mL via INTRAVENOUS
  Filled 2018-11-04: qty 10

## 2018-11-04 MED ORDER — HEPARIN SOD (PORK) LOCK FLUSH 100 UNIT/ML IV SOLN
500.0000 [IU] | Freq: Once | INTRAVENOUS | Status: AC
  Administered 2018-11-04: 500 [IU] via INTRAVENOUS

## 2018-11-04 MED ORDER — SODIUM CHLORIDE 0.9 % IV SOLN
1200.0000 mg | Freq: Once | INTRAVENOUS | Status: AC
  Administered 2018-11-04: 1200 mg via INTRAVENOUS
  Filled 2018-11-04: qty 60

## 2018-11-04 NOTE — Progress Notes (Signed)
Pt here for follow up. Pt. Reports numbness in bilateral legs starting "couple of weeks ago". Reports he had similar symptoms in 2012 when the pump was implanted and was getting too much medicine at the time. Believes he may be getting too much of this. Next appt. Is 12/10/2018.

## 2018-11-04 NOTE — Patient Instructions (Signed)
Daratumumab injection What is this medicine? DARATUMUMAB (dar a toom ue mab) is a monoclonal antibody. It is used to treat multiple myeloma. This medicine may be used for other purposes; ask your health care provider or pharmacist if you have questions. COMMON BRAND NAME(S): DARZALEX What should I tell my health care provider before I take this medicine? They need to know if you have any of these conditions: -infection (especially a virus infection such as chickenpox, herpes, or hepatitis B virus) -lung or breathing disease -an unusual or allergic reaction to daratumumab, other medicines, foods, dyes, or preservatives -pregnant or trying to get pregnant -breast-feeding How should I use this medicine? This medicine is for infusion into a vein. It is given by a health care professional in a hospital or clinic setting. Talk to your pediatrician regarding the use of this medicine in children. Special care may be needed. Overdosage: If you think you have taken too much of this medicine contact a poison control center or emergency room at once. NOTE: This medicine is only for you. Do not share this medicine with others. What if I miss a dose? Keep appointments for follow-up doses as directed. It is important not to miss your dose. Call your doctor or health care professional if you are unable to keep an appointment. What may interact with this medicine? Interactions have not been studied. Give your health care provider a list of all the medicines, herbs, non-prescription drugs, or dietary supplements you use. Also tell them if you smoke, drink alcohol, or use illegal drugs. Some items may interact with your medicine. This list may not describe all possible interactions. Give your health care provider a list of all the medicines, herbs, non-prescription drugs, or dietary supplements you use. Also tell them if you smoke, drink alcohol, or use illegal drugs. Some items may interact with your  medicine. What should I watch for while using this medicine? This drug may make you feel generally unwell. Report any side effects. Continue your course of treatment even though you feel ill unless your doctor tells you to stop. This medicine can cause serious allergic reactions. To reduce your risk you may need to take medicine before treatment with this medicine. Take your medicine as directed. This medicine can affect the results of blood tests to match your blood type. These changes can last for up to 6 months after the final dose. Your healthcare provider will do blood tests to match your blood type before you start treatment. Tell all of your healthcare providers that you are being treated with this medicine before receiving a blood transfusion. This medicine can affect the results of some tests used to determine treatment response; extra tests may be needed to evaluate response. Do not become pregnant while taking this medicine or for 3 months after stopping it. Women should inform their doctor if they wish to become pregnant or think they might be pregnant. There is a potential for serious side effects to an unborn child. Talk to your health care professional or pharmacist for more information. What side effects may I notice from receiving this medicine? Side effects that you should report to your doctor or health care professional as soon as possible: -allergic reactions like skin rash, itching or hives, swelling of the face, lips, or tongue -breathing problems -chills -cough -dizziness -feeling faint or lightheaded -headache -low blood counts - this medicine may decrease the number of white blood cells, red blood cells and platelets. You may be at increased   risk for infections and bleeding. -nausea, vomiting -shortness of breath -signs of decreased platelets or bleeding - bruising, pinpoint red spots on the skin, black, tarry stools, blood in the urine -signs of decreased red blood  cells - unusually weak or tired, feeling faint or lightheaded, falls -signs of infection - fever or chills, cough, sore throat, pain or difficulty passing urine -signs and symptoms of liver injury like dark yellow or brown urine; general ill feeling or flu-like symptoms; light-colored stools; loss of appetite; right upper belly pain; unusually weak or tired; yellowing of the eyes or skin Side effects that usually do not require medical attention (report to your doctor or health care professional if they continue or are bothersome): -back pain -constipation -loss of appetite -diarrhea -joint pain -muscle cramps -pain, tingling, numbness in the hands or feet -swelling of the ankles, feet, hands -tiredness -trouble sleeping This list may not describe all possible side effects. Call your doctor for medical advice about side effects. You may report side effects to FDA at 1-800-FDA-1088. Where should I keep my medicine? Keep out of the reach of children. This drug is given in a hospital or clinic and will not be stored at home. NOTE: This sheet is a summary. It may not cover all possible information. If you have questions about this medicine, talk to your doctor, pharmacist, or health care provider.  2019 [employer] (2018-04-26 15:52:44)

## 2018-11-05 LAB — PROTEIN ELECTROPHORESIS, SERUM
A/G Ratio: 1.9 — ABNORMAL HIGH (ref 0.7–1.7)
Albumin ELP: 3.7 g/dL (ref 2.9–4.4)
Alpha-1-Globulin: 0.1 g/dL (ref 0.0–0.4)
Alpha-2-Globulin: 0.8 g/dL (ref 0.4–1.0)
Beta Globulin: 0.7 g/dL (ref 0.7–1.3)
Gamma Globulin: 0.2 g/dL — ABNORMAL LOW (ref 0.4–1.8)
Globulin, Total: 1.9 g/dL — ABNORMAL LOW (ref 2.2–3.9)
M-Spike, %: 0.1 g/dL — ABNORMAL HIGH
Total Protein ELP: 5.6 g/dL — ABNORMAL LOW (ref 6.0–8.5)

## 2018-11-05 LAB — KAPPA/LAMBDA LIGHT CHAINS
Kappa free light chain: 5.8 mg/L (ref 3.3–19.4)
Kappa, lambda light chain ratio: 1.05 (ref 0.26–1.65)
Lambda free light chains: 5.5 mg/L — ABNORMAL LOW (ref 5.7–26.3)

## 2018-11-07 ENCOUNTER — Other Ambulatory Visit: Payer: Self-pay | Admitting: Urgent Care

## 2018-11-07 DIAGNOSIS — C9001 Multiple myeloma in remission: Secondary | ICD-10-CM

## 2018-11-08 ENCOUNTER — Other Ambulatory Visit: Payer: Self-pay | Admitting: Urgent Care

## 2018-11-10 ENCOUNTER — Other Ambulatory Visit: Payer: Self-pay | Admitting: Urgent Care

## 2018-11-10 DIAGNOSIS — C9001 Multiple myeloma in remission: Secondary | ICD-10-CM

## 2018-11-21 ENCOUNTER — Other Ambulatory Visit: Payer: Self-pay | Admitting: Nurse Practitioner

## 2018-11-21 ENCOUNTER — Telehealth: Payer: Self-pay | Admitting: Nurse Practitioner

## 2018-11-21 DIAGNOSIS — M62838 Other muscle spasm: Secondary | ICD-10-CM

## 2018-11-21 MED ORDER — BACLOFEN 10 MG PO TABS: 10.0000 mg | ORAL_TABLET | Freq: Two times a day (BID) | ORAL | 0 refills | Status: DC

## 2018-11-21 NOTE — Telephone Encounter (Signed)
sent 

## 2018-11-21 NOTE — Telephone Encounter (Signed)
Patient needs refill on baclofen sent to pharmacy please.

## 2018-11-26 ENCOUNTER — Telehealth: Payer: Self-pay

## 2018-11-26 ENCOUNTER — Other Ambulatory Visit: Payer: Self-pay | Admitting: Urgent Care

## 2018-11-26 DIAGNOSIS — C9001 Multiple myeloma in remission: Secondary | ICD-10-CM

## 2018-11-26 NOTE — Telephone Encounter (Signed)
-----   Message from Lequita Asal, MD sent at 11/26/2018  1:33 PM EST ----- Regarding: Please confirm if patient still taking potassium  Rx sent from pharmacy.  Thanks,  M

## 2018-11-26 NOTE — Telephone Encounter (Signed)
Contacted patient to confirm is he is still taking Potassium. Pt verifies he is taking 1 tablet (20 MEQ) daily. Informed Dr. Mike Gip.

## 2018-11-28 ENCOUNTER — Other Ambulatory Visit: Payer: Self-pay

## 2018-11-28 DIAGNOSIS — C9000 Multiple myeloma not having achieved remission: Secondary | ICD-10-CM

## 2018-11-30 NOTE — Progress Notes (Signed)
[facility] day:  12/02/18  Chief Complaint: Logan Perez is a 73 y.o. 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  HPI: The patient was last seen in the medical oncology clinic on 11/04/2018.  At that time, he was doing well.  He denied any acute concerns.  RIGHT knee pain had improved. He continued to have allergy related rhinorrhea. Chronic back pain was controlled with implanted baclofen bump. Bowels continued to be loose; using loperamide.  Exam remained stable and unchanged from previous. WBC was 8400 ([place] 5100). Creatinine was 1.36 mg/dL.  M-spike was 0.1 gm/dL.  He received cycle #24 daratumumab on 10/07/2018.  During the interim, he has been doing"ok".   He states that he has had an uneventful month.  He denies any complaints. He stopped drinking Boost.  Has some diarrhea.  He eats yogurt daily.  He notes cyclic diarrhea where he takes some Imodium.  Neuropathy has improved.  He denies any interval infections.   Past Medical History:  Diagnosis Date  . Anxiety   . Atrial fibrillation ([place])   . BPH (benign prostatic hyperplasia)   . Complication of anesthesia    BAD HEADACHE NIGHT OF FIRST CATARACT  . Compression fracture of lumbar vertebra ([place])   . Difficulty voiding   . Dysrhythmia    A FIB  . Elevated PSA   . GERD (gastroesophageal reflux disease)   . Hearing aid worn    bilateral  . History of kidney stones   . HLD (hyperlipidemia)   . HOH (hard of hearing)   . Hypertension   . Multiple myeloma ([place])   . Neuropathy    feet. 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  . Pain    BACK  . Palpitations   . Pneumonia   . Pulmonary embolism ([place])   . Sepsis ([place])   . Stroke [facility])    TIA, detected on CT scan. pt was unaware    Past Surgical History:  Procedure Laterality Date  . BACK  SURGERY  1994  . CATARACT EXTRACTION W/PHACO Right 05/02/2016   Procedure: CATARACT EXTRACTION PHACO AND INTRAOCULAR LENS PLACEMENT (IOC);  Surgeon: Birder Robson, MD;  Location: [facility] ORS;  Service: Ophthalmology;  Laterality: Right;  [place] 1.06 AP% 20.6 CDE 13.70 FLUID PACK LOT # [id number]  . CATARACT EXTRACTION W/PHACO Left 05/16/2016   Procedure: CATARACT EXTRACTION PHACO AND INTRAOCULAR LENS PLACEMENT (IOC);  Surgeon: Birder Robson, MD;  Location: [facility] OR[facility];  Service: Ophthalmology;  Laterality: Left;  [place] 01:43 AP% 19.8 CDE 20.45 FLUID PACK LOT #[id number] H  . COLONOSCOPY WITH PROPOFOL N/A 03/22/2017   Procedure: COLONOSCOPY WITH PROPOFOL;  Surgeon: Lucilla Lame, MD;  Location: [place];  Service: Endoscopy;  Laterality: N/A;  has port  . ESOPHAGOGASTRODUODENOSCOPY (EGD) WITH PROPOFOL  03/22/2017   Procedure: ESOPHAGOGASTRODUODENOSCOPY (EGD) WITH PROPOFOL;  Surgeon: Lucilla Lame, MD;  Location: [place];  Service: Endoscopy;;  . EYE SURGERY    . KNEE ARTHROSCOPY Left 1992  . LIMBAL STEM CELL TRANSPLANT    . PAIN PUMP IMPLANTATION  2012  . PAIN PUMP IMPLANTATION N/A 09/04/2017   Procedure: INTRATHECAL PUMP BATTERY CHANGE;  Surgeon: Milinda Pointer, MD;  Location: [facility];  Service: Neurosurgery;  Laterality: N/A;  . PORTA CATH INSERTION N/A 12/04/2016   Procedure: Glori Luis Cath Insertion;  Surgeon: Algernon Huxley, MD;  Location: [place] [facility];  Service: Cardiovascular;  Laterality: N/A;  . stem cell implant  2008   [facility]    Family History  Problem Relation Age of Onset  . Cancer Father        throat  . Kidney disease Sister   . Stroke Other   . Stroke Mother   . Bladder Cancer Neg Hx   . Prostate cancer Neg Hx   . Kidney cancer Neg Hx     Social History:  reports that he quit smoking about 43 years ago. His smoking use included cigarettes. He has a 30.00 pack-year smoking history. He has never used smokeless tobacco. He reports that he does not drink alcohol  or use drugs.  He has a wire haired dachshund.  He lives in [place].  His wife's name is [name].  The patient is alone today.  Allergies:  Allergies  Allergen Reactions  . Azithromycin Diarrhea and Other (See Comments)    Possible cause of C. Diff Possible cause of C. Diff Possible cause of C. Diff Possible cause of C. Diff  . Bee Pollen Other (See Comments)    Sneezing, watery eyes, runny nose  . Pollen Extract Other (See Comments)    Sneezing, watery eyes, runny nose  . 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 Rivaroxaban Rash    Current Medications: Current Outpatient Medications  Medication Sig Dispense Refill  . ALPRAZolam (XANAX) 0.25 MG tablet TAKE 1 TABLET BY MOUTH AT BEDTIME AS NEEDED 30 tablet 0  . baclofen (LIORESAL) 10 MG tablet Take 1 tablet (10 mg total) by mouth 2 (two) times daily for 30 days. 60 tablet 0  . budesonide (ENTOCORT EC) 3 MG 24 hr capsule Take 3 capsules (9 mg total) by mouth every morning. 90 capsule 6  . Calcium Carb-Cholecalciferol (CALCIUM-VITAMIN D) 500-400 MG-UNIT TABS Take 1 tablet daily by mouth.    . cetirizine (ZYRTEC) 10 MG tablet Take 10 mg by mouth daily as needed for allergies.     . Cholecalciferol (VITAMIN D3) 2000 units capsule Take 2,000 Units daily by mouth.     . Daratumumab (DARZALEX IV) Inject 1,200 mg every 30 (thirty) days into the vein.     [name] diltiazem (CARDIZEM CD) 120 MG 24 hr capsule Take 120 mg daily by mouth.     . diltiazem (CARDIZEM) 60 MG tablet Take 60 mg daily as needed by mouth (for increased heart rate > 140).     [name] ELIQUIS 5 MG TABS tablet TAKE 1 TABLET BY MOUTH TWICE DAILY 60 tablet 0  . Hypromellose (ARTIFICIAL TEARS OP) Place 1 drop as needed into both eyes (for dry eyes).    [name] loperamide (IMODIUM) 2 MG capsule Take 4 mg as needed by mouth for diarrhea or loose stools.     [name] loratadine (CLARITIN) 10 MG tablet Take 10 mg by mouth daily.    [name] lovastatin  (MEVACOR) 20 MG tablet Take 20 mg by mouth every evening.     . Multiple Vitamins-Iron (MULTIVITAMIN/IRON PO) Take 1 tablet daily by mouth.    [name] omeprazole (PRILOSEC) 20 MG capsule Take 20 mg by mouth daily.     . potassium chloride SA (K-DUR,KLOR-CON) 20 MEQ tablet TAKE 1 TABLET BY MOUTH DAILY 90 tablet 0  . tamsulosin (FLOMAX) 0.4 MG CAPS capsule Take 1 capsule (0.4 mg total) by mouth daily. 90 capsule 4  . vitamin B-12 (  CYANOCOBALAMIN) 1000 MCG tablet Take 1,000 mcg daily by mouth.     [name] acetaminophen (TYLENOL) 325 MG tablet Take 650 mg every 6 (six) hours as needed by mouth for mild pain or headache.     . bismuth subsalicylate (PEPTO BISMOL) 262 MG/15ML suspension Take 30 mLs by mouth every 6 (six) hours as needed for diarrhea or loose stools.    [name] dexamethasone (DECADRON) 4 MG tablet TAKE 4 TABLETS BY MOUTH 1 HOUR PRIOR TO INFUSION, THEN 1 TABLET ON DAYS 2 AND 3 AS DIRECTED (Patient not taking: Reported on 12/02/2018) 30 tablet 0  . diphenhydrAMINE (BENADRYL) 25 MG tablet Take 25 mg by mouth as directed. With chemo treatment    . ENSURE (ENSURE) Take 237 mLs by mouth daily.    . fluticasone (FLONASE) 50 MCG/ACT nasal spray Place 1 spray into the nose daily as needed for allergies.     . montelukast (SINGULAIR) 10 MG tablet TAKE 1 TABLET BY MOUTH DAILY THE DAY BEFORE THE INFUSION, THEN DAY OF AND THE 2 DAYS AFTER INFUSION (Patient not taking: Reported on 12/02/2018) 30 tablet 0  . ondansetron (ZOFRAN) 8 MG tablet Take 1 tablet (8 mg total) by mouth as directed. Take with chemo and can take it as needed (Patient not taking: Reported on 12/02/2018) 20 tablet 1  . PAIN MANAGEMENT IT PUMP REFILL 1 each by Intrathecal route once for 1 dose. Medication: PF Fentanyl 1,500.0 mcg/ml PF Bupivicaine 30.0 mg/ml PF Clonidine 300.0 mcg/ml Total Volume: 40 ml Needed by 09-11-18 @ 1000 1 each 0   No current facility-administered medications for this visit.    Facility-Administered Medications Ordered in  Other Visits  Medication Dose Route Frequency Provider Last Rate Last Dose  . heparin lock flush 100 unit/mL  500 Units Intravenous Once ,  C, MD      . sodium chloride flush (NS) 0.9 % injection 10 mL  10 mL Intravenous PRN Lequita Asal, MD   10 mL at 12/02/18 0859    Review of Systems  Constitutional: Positive for weight loss (1 pound). Negative for chills, diaphoresis, fever and malaise/fatigue.       Doing "ok".  HENT: Negative.  Negative for congestion, ear discharge, ear pain, nosebleeds, sinus pain, sore throat and tinnitus.   Eyes: Negative.  Negative for blurred vision, double vision, photophobia and pain.  Respiratory: Negative.  Negative for cough, hemoptysis, sputum production and shortness of breath.   Cardiovascular: Negative.  Negative for chest pain, palpitations, orthopnea, leg swelling and PND.       PMH (+) atrial fibrillation.  Gastrointestinal: Positive for diarrhea (chronic- cyclic). Negative for abdominal pain, blood in stool, constipation, heartburn, melena, nausea and vomiting.  Genitourinary: Negative.  Negative for dysuria, frequency, hematuria and urgency.  Musculoskeletal: Positive for back pain (chronic; implanted pump). Negative for falls, joint pain, myalgias and neck pain.  Skin: Negative.  Negative for itching and rash.  Neurological: Positive for sensory change (neuropathy in fingers and toes). Negative for dizziness, tremors, focal weakness, weakness and headaches.  Endo/Heme/Allergies: Positive for environmental allergies (seasonal allergies). Does not bruise/bleed easily.  Psychiatric/Behavioral: Negative.  Negative for depression and memory loss. The patient is not nervous/anxious and does not have insomnia.   All other systems reviewed and are negative.  Performance status (ECOG): 1  Vital signs BP 129/89 (BP Location: Right Arm, Patient Position: Sitting)   Pulse 86   Temp 97.9 F (36.6 C) (Tympanic)   Resp 18   Ht '5\' 6"'   (  1.676 m)   Wt 166 lb 7.2 oz (75.5 kg)   SpO2 100%   BMI 26.87 kg/m   Physical Exam  Constitutional: He is oriented to person, place, and time and well-developed, well-nourished, and in no distress. No distress.  HENT:  Head: Normocephalic and atraumatic.  Mouth/Throat: Oropharynx is clear and moist and mucous membranes are normal. No oropharyngeal exudate.  Short gray hair with goatee.  Eyes: Pupils are equal, round, and reactive to light. Conjunctivae and EOM are normal. No scleral icterus.  Glasses.  Neck: Normal range of motion. Neck supple. No JVD present.  Cardiovascular: Normal rate, regular rhythm, normal heart sounds and intact distal pulses. Exam reveals no gallop and no friction rub.  No murmur heard. Pulmonary/Chest: Effort normal and breath sounds normal. No respiratory distress. He has no wheezes. He has no rales.  Abdominal: Soft. Bowel sounds are normal. He exhibits no distension and no mass. There is no abdominal tenderness. There is no rebound and no guarding.  Right sided palpable pain pump.  Musculoskeletal: Normal range of motion.        General: No tenderness or edema.  Lymphadenopathy:    He has no cervical adenopathy.    He has no axillary adenopathy.       Right: No supraclavicular adenopathy present.       Left: No supraclavicular adenopathy present.  Neurological: He is alert and oriented to person, place, and time. Gait normal.  Skin: Skin is warm and dry. No rash noted. He is not diaphoretic. No erythema. No pallor.  Skin graft to tip of nose following Moh's surgery.  Psychiatric: Mood, affect and judgment normal.  Nursing note and vitals reviewed.    Infusion on 12/02/2018  Component Date Value Ref Range Status  . Magnesium 12/02/2018 1.9  1.7 - 2.4 mg/dL Final   Performed at [facility], [address], [place], [place] [address]  . Sodium 12/02/2018 135  135 - 145 mmol/L Final  . Potassium 12/02/2018 3.9  3.5 - 5.1 mmol/L Final  .  Chloride 12/02/2018 102  98 - 111 mmol/L Final  . CO2 12/02/2018 22  22 - 32 mmol/L Final  . Glucose, Bld 12/02/2018 172* 70 - 99 mg/dL Final  . BUN 12/02/2018 24* 8 - 23 mg/dL Final  . Creatinine, Ser 12/02/2018 1.41* 0.61 - 1.24 mg/dL Final  . Calcium 12/02/2018 8.8* 8.9 - 10.3 mg/dL Final  . Total Protein 12/02/2018 6.4* 6.5 - 8.1 g/dL Final  . Albumin 12/02/2018 3.9  3.5 - 5.0 g/dL Final  . AST 12/02/2018 25  15 - 41 U/L Final  . ALT 12/02/2018 18  0 - 44 U/L Final  . Alkaline Phosphatase 12/02/2018 49  38 - 126 U/L Final  . Total Bilirubin 12/02/2018 0.9  0.3 - 1.2 mg/dL Final  . GFR calc non Af Amer 12/02/2018 49* >60 mL/min Final  . GFR calc Af Amer 12/02/2018 57* >60 mL/min Final  . Anion gap 12/02/2018 11  5 - 15 Final   Performed at [facility], [address], [place], [place] [address]  . WBC 12/02/2018 9.5  4.0 - 10.5 K/uL Final  . RBC 12/02/2018 3.72* 4.22 - 5.81 MIL/uL Final  . Hemoglobin 12/02/2018 12.7* 13.0 - 17.0 g/dL Final  . HCT 12/02/2018 37.8* 39.0 - 52.0 % Final  . MCV 12/02/2018 101.6* 80.0 - 100.0 fL Final  . MCH 12/02/2018 34.1* 26.0 - 34.0 pg Final  . MCHC 12/02/2018 33.6  30.0 - 36.0 g/dL Final  . RDW 12/02/2018 14.1  11.5 - 15.5 % Final  . Platelets 12/02/2018 181  150 - 400 K/uL Final  . nRBC 12/02/2018 0.0  0.0 - 0.2 % Final  . Neutrophils Relative % 12/02/2018 80  % Final  . Neutro Abs 12/02/2018 7.6  1.7 - 7.7 K/uL Final  . Lymphocytes Relative 12/02/2018 15  % Final  . Lymphs Abs 12/02/2018 1.4  0.7 - 4.0 K/uL Final  . Monocytes Relative 12/02/2018 4  % Final  . Monocytes Absolute 12/02/2018 0.3  0.1 - 1.0 K/uL Final  . Eosinophils Relative 12/02/2018 1  % Final  . Eosinophils Absolute 12/02/2018 0.1  0.0 - 0.5 K/uL Final  . Basophils Relative 12/02/2018 0  % Final  . Basophils Absolute 12/02/2018 0.0  0.0 - 0.1 K/uL Final  . Immature Granulocytes 12/02/2018 0  % Final  . Abs Immature Granulocytes 12/02/2018 0.04  0.00 - 0.07 K/uL  Final   Performed at [facility], [address] [place], [place] [address]    Assessment:  Paden Senger is a 73 y.o. male with stage III mutiple myeloma.  He initially presented with progressive back pain beginning in 12/2006.  MRI revealed "spots and compression fractures".  He began Velcade, thalidomide, and Decadron.  In 08/2007, he underwent high dose chemotherapy and autologous stem cell transplant.  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  He recurred with a rising M-spike (2.7) with repeat M spike (1.7 gm/dl) in 03/2010.  He was initially treated with Velcade (02/08/2010 - 05/10/2010).  He then began Revlimid (15 mg 3 weeks on/1 week off) and Decadron (40 mg on day 1, 8, 15, 22).  Because of significant side effect with Decadron his dose was decreased to 10 mg once a week in 07/2010.    He was on maintenance Revlimid. Revlimid was initially10 mg 3 weeks on/1 week off. This was changed to 10 mg 2 weeks on/2 weeks off secondary to right nipple tenderness. His dose was increased to 10 mg 3 weeks on/1 week off with Decadron 10 mg a week (on Sundays) and then Revlamid 15 mg 3 weeks on and 1 week off with Decadron on Sundays.  He began Pomalyst 4 mg 3 weeks on/1 week off with Decadron on 08/27/2015.  Over the past year his SPEP has revealed no monoclonal protein (04/21/2015) and 0.5 gm/dL on 09/22/2015 and 10/20/2015. M spike was 0.1 on 02/02/2016, 03/01/2016, 03/29/2016, 04/26/2016, and 05/24/2016.  M spike was 0 on 10/11/2016, 11/28/2016, 02/05/2017, 03/21/2017, 08/02/2017, 10/04/2017, 12/27/2017, 01/24/2018, and 02/21/2018.  M spike was 0.1 on 11/01/2017, 0.1 on 11/29/2017, 0 on 12/27/2017, 0 on 01/24/2018, 0 on 02/21/2018, 0.1 on 03/21/2018, 0.3 on 04/18/2018, 0.1 on 05/16/2018, 0 on 06/13/2018, 0.2 on 08/08/2018, 0 on 09/09/2018, 0.1 on 11/04/2018, and 0 on 12/02/2018.  Free light chains have been monitored. Kappa free light chains were 18.54  on 11/21/2013, 18.37 on 02/20/2014, 18.93 on 05/22/2014, 32.58 (high; normal ratio 1.27) on 08/21/2014, 51.53 (high; elevated ratio of 2.12) on 11/13/2014, 28.08 (ratio 1.73) on 12/09/2014, 23.71 (ratio 2.17) on 01/18/2015, 92.93 (ratio 9.49) on 04/21/2015, 93.44 (ratio 10.28) on 05/26/2015, 255.45 (ratio of 24.05) on 07/14/2015, 373.89 (ratio 48.31) on 08/04/2015, 474.33 (ratio 70.58) on 08/25/2015, 450.76 (ratio 55.44) on 09/22/2015, 453.4 (ratio 59.89) on 10/20/2015, 58.26 (ratio of > 40.74) on 02/02/2016, 62.67 (ratio of 37.98) on 03/01/2016, 75.7 (ratio > 50.47) on 03/29/2016, 79.7 (ratio 53.13) on 04/26/2016, 108.6 (ratio >  72.4) on 05/24/2016, 8.3 (1.46 ratio) on 10/11/2016, 6.7 (0.81 ratio) on 02/05/2017, 7.8 (1.01 ratio) on 03/21/2017,7.6 (ratio 0.63) on 06/01/2017, 8.1 (ratio 1.13) on 11/29/2017, 9.6 (ratio 1.55) on 06/13/2018, 6.6 (ratio 2.06) on 07/11/2018, 6.9 (ratio 0.82) on 08/08/2018, 5.8 (ratio 1.66) on 09/09/2018, 5.8 (ratio 1.05) on 11/04/2018, 5.6 (ratio 2.55 on 12/02/2018).  Bone survey on 12/08/2014 was stable.  Bone survey on 10/21/2015 revealed increase conspicuity of subcentimeter lytic lesions in the calvarium.  Bone marrow aspirate and biopsy on 11/04/2015 revealed an atypical monoclonal plasma cells estimated at 30-40% of marrow cells.   Marrow was variably cellular (approximately 45%) with background trilineage hematopoiesis. There was no significant increase in marrow reticulin fibers. Storage iron was present.    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 He develoed herpes zoster in 04/2008. He developed a pulmonary embolism in 05/2013. He was initially on Xarelto, but is now on Eliquis. He had an episode of pneumonia around this time requiring a brief admission. He developed severe lower leg cramps on 08/07/2014 secondary to hypokalemia. Duplex was negative.   He was treated for C difficile colitis (Flagyl completed  07/30/2015).  He has a chronic indwelling pain pump.  He received 4 cycles of Pomalyst and Decadron (08/27/2015 - 11/19/2015).  Restaging studies document progressive disease.  Kappa free light chains are increasing.  SPEP revealed 0.5 gm/dL monoclonal protein then 1.3 gm/dL.  Bone survey reveals increase conspicuity of subcentimeter lytic lesions in the calvarium.  Bone marrow reveals 30-40% plasma cells.   MUGA on 11/30/2015 revealed an ejection fraction of 46%.  He is felt not to be a good candidate for Kyprolis.  There were no focal wall motion abnormalities. He had a stress echo less than 1 year ago.  He has a history of PVCs and atrial fibrillation.  He takes Cardizem prn.  He received 17 weeks of daratumumab (Darzalex) (12/09/2015 - 05/25/2016).  He tolerated treatment well without side effect.  Bone marrow on 02/09/2016 revealed no diagnostic morphologic evidence of plasma cell myeloma.  Marrow was normocellular to hypocellular marrow for age (ranging from 10-40%) with maturing trilineage hematopoiesis and mild multilineage dyspoiesis.  There was patchy mild increase in reticulin.  Storage iron was present.  Flow cytometry revealed no definitive evidence of monoclonality.  There was a non-specific atypical myeloid and monocytic findings with no increase in blasts.  Cytogenetics were normal (46, XY).  He is currently 30 months s/p 2nd autologous stem cell transplant on 06/16/2016.  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  He also experienced atrial fibrillation with intermittent episodes of RVR requiring IV beta blockers.  He is on prophylactic valacyclovir for 1 year post transplant.  He started vaccinations (DTaP-Pediatric triple vaccine, Hep B- Pediatrix triple vaccine, Haemophilus influenza B (Hib), inactivated polio virus (IPV), pneumococcal conjugate vaccine 13-valent (PCV 13)) on 04/17/2017.  Recommendation was for Shingrix vaccine to be  given in [place] and second dose of Shingrix in 2 months.  He had follow-up vaccinations on 08/28/2017.  He had his second shingles vaccine. He received 18 month vaccinations - DTaP-Pediatric triple vaccine, Hep B- Pediatrix triple vaccine, Haemophilus influenza B (Hib), inactivated polio virus (IPV), pneumococcal conjugate vaccine 13-valent (PCV 13) on 02/05/2018.  He is s/p 24 cycles of Daratumumab (Darzalex) post transplant (12/05/2016 - 11/04/2018).  PET scan on 08/14/2018 revealed no evidence of active myeloma on whole-body FDG PET scan.  There was no  evidence soft tissue plasmacytoma.  There was no clear evidence of lytic lesions on the CT portion exam. Some lucencies in the pelvis were stable. There were chronic compression fractures in the lower thoracic spine.  He has a history of hypomagnesemia that required IV magnesium (2-4 gm) weekly.  He has not required magnesium since 10/24/2016.  Symptomatically, he is doing well.  Exam is unremarkable.  Plan:  1. Labs today :  CBC with diff, CMP, SPEP, FLCA, Mg. 2. Multiple myeloma Clinically doing well. SPEP 0 today. Labs reviewed.  Discuss plan for continuation of therapy. Patient took premedications at home. Cycle # 25 daratumumab today.   Discuss symptom management.  He has antiemetics and pain medications at home to use on a prn bases.  Interventions are adequate.    3. Numbness to BILATERAL lower extremities Patient associates symptoms with over medication via pain pump. Patient follows up in pain clinic for adjustment in pump.  4. Chronic diarrhea Patient notes no change in diarrhea. Diarrhea well managed and "cyclic". Patient has not followed up with Dr. Allen Norris Continue loperamide prn. 5. Neuropathy Patient notes a little "tingle". 6. Atrial fibrillation Patient has rate controlled atrial fibrillation. Eliquis continues. Patient followed by cardiology.  7. RTC in 4 weeks for MD assessment, labs (CBC with diff, CMP,  magnesium, SPEP) and cycle #26 daratumumab.   Lequita Asal 12/02/18, 9:59 AM

## 2018-12-02 ENCOUNTER — Other Ambulatory Visit: Payer: Self-pay

## 2018-12-02 ENCOUNTER — Encounter: Payer: Self-pay | Admitting: Hematology and Oncology

## 2018-12-02 ENCOUNTER — Inpatient Hospital Stay: Payer: Medicare Other

## 2018-12-02 ENCOUNTER — Inpatient Hospital Stay: Payer: Medicare Other | Attending: Hematology and Oncology | Admitting: Hematology and Oncology

## 2018-12-02 VITALS — BP 124/76 | HR 73 | Temp 97.0°F | Resp 16

## 2018-12-02 VITALS — BP 129/89 | HR 86 | Temp 97.9°F | Resp 18 | Ht 66.0 in | Wt 166.4 lb

## 2018-12-02 DIAGNOSIS — Z5112 Encounter for antineoplastic immunotherapy: Secondary | ICD-10-CM

## 2018-12-02 DIAGNOSIS — G629 Polyneuropathy, unspecified: Secondary | ICD-10-CM

## 2018-12-02 DIAGNOSIS — R200 Anesthesia of skin: Secondary | ICD-10-CM

## 2018-12-02 DIAGNOSIS — Z7901 Long term (current) use of anticoagulants: Secondary | ICD-10-CM

## 2018-12-02 DIAGNOSIS — C9002 Multiple myeloma in relapse: Secondary | ICD-10-CM | POA: Diagnosis not present

## 2018-12-02 DIAGNOSIS — I4891 Unspecified atrial fibrillation: Secondary | ICD-10-CM

## 2018-12-02 DIAGNOSIS — R197 Diarrhea, unspecified: Secondary | ICD-10-CM | POA: Diagnosis not present

## 2018-12-02 DIAGNOSIS — C9000 Multiple myeloma not having achieved remission: Secondary | ICD-10-CM

## 2018-12-02 LAB — MAGNESIUM: Magnesium: 1.9 mg/dL (ref 1.7–2.4)

## 2018-12-02 LAB — CBC WITH DIFFERENTIAL/PLATELET
Abs Immature Granulocytes: 0.04 10*3/uL (ref 0.00–0.07)
Basophils Absolute: 0 10*3/uL (ref 0.0–0.1)
Basophils Relative: 0 %
Eosinophils Absolute: 0.1 10*3/uL (ref 0.0–0.5)
Eosinophils Relative: 1 %
HCT: 37.8 % — ABNORMAL LOW (ref 39.0–52.0)
Hemoglobin: 12.7 g/dL — ABNORMAL LOW (ref 13.0–17.0)
Immature Granulocytes: 0 %
Lymphocytes Relative: 15 %
Lymphs Abs: 1.4 10*3/uL (ref 0.7–4.0)
MCH: 34.1 pg — ABNORMAL HIGH (ref 26.0–34.0)
MCHC: 33.6 g/dL (ref 30.0–36.0)
MCV: 101.6 fL — ABNORMAL HIGH (ref 80.0–100.0)
Monocytes Absolute: 0.3 10*3/uL (ref 0.1–1.0)
Monocytes Relative: 4 %
Neutro Abs: 7.6 10*3/uL (ref 1.7–7.7)
Neutrophils Relative %: 80 %
Platelets: 181 10*3/uL (ref 150–400)
RBC: 3.72 MIL/uL — ABNORMAL LOW (ref 4.22–5.81)
RDW: 14.1 % (ref 11.5–15.5)
WBC: 9.5 10*3/uL (ref 4.0–10.5)
nRBC: 0 % (ref 0.0–0.2)

## 2018-12-02 LAB — COMPREHENSIVE METABOLIC PANEL
ALT: 18 U/L (ref 0–44)
AST: 25 U/L (ref 15–41)
Albumin: 3.9 g/dL (ref 3.5–5.0)
Alkaline Phosphatase: 49 U/L (ref 38–126)
Anion gap: 11 (ref 5–15)
BUN: 24 mg/dL — ABNORMAL HIGH (ref 8–23)
CO2: 22 mmol/L (ref 22–32)
Calcium: 8.8 mg/dL — ABNORMAL LOW (ref 8.9–10.3)
Chloride: 102 mmol/L (ref 98–111)
Creatinine, Ser: 1.41 mg/dL — ABNORMAL HIGH (ref 0.61–1.24)
GFR calc Af Amer: 57 mL/min — ABNORMAL LOW (ref >60)
GFR calc non Af Amer: 49 mL/min — ABNORMAL LOW (ref >60)
Glucose, Bld: 172 mg/dL — ABNORMAL HIGH (ref 70–99)
Potassium: 3.9 mmol/L (ref 3.5–5.1)
Sodium: 135 mmol/L (ref 135–145)
Total Bilirubin: 0.9 mg/dL (ref 0.3–1.2)
Total Protein: 6.4 g/dL — ABNORMAL LOW (ref 6.5–8.1)

## 2018-12-02 MED ORDER — HEPARIN SOD (PORK) LOCK FLUSH 100 UNIT/ML IV SOLN
500.0000 [IU] | Freq: Once | INTRAVENOUS | Status: AC
  Administered 2018-12-02: 500 [IU] via INTRAVENOUS
  Filled 2018-12-02: qty 5

## 2018-12-02 MED ORDER — SODIUM CHLORIDE 0.9 % IV SOLN
Freq: Once | INTRAVENOUS | Status: AC
  Administered 2018-12-02: 10:00:00 via INTRAVENOUS
  Filled 2018-12-02: qty 250

## 2018-12-02 MED ORDER — SODIUM CHLORIDE 0.9% FLUSH
10.0000 mL | INTRAVENOUS | Status: DC | PRN
  Administered 2018-12-02: 10 mL via INTRAVENOUS
  Filled 2018-12-02: qty 10

## 2018-12-02 MED ORDER — SODIUM CHLORIDE 0.9 % IV SOLN
1200.0000 mg | Freq: Once | INTRAVENOUS | Status: AC
  Administered 2018-12-02: 1200 mg via INTRAVENOUS
  Filled 2018-12-02: qty 60

## 2018-12-02 MED ORDER — PAIN MANAGEMENT IT PUMP REFILL: 1.0000 | Freq: Once | INTRATHECAL | 0 refills | Status: DC

## 2018-12-02 NOTE — Progress Notes (Signed)
Diarrhea better after stopping Boost

## 2018-12-02 NOTE — Progress Notes (Signed)
Patient took his benadryl, acetaminophen and zofran prior to coming to the clinic this am. OK to proceed with Darzalex today.

## 2018-12-03 LAB — PROTEIN ELECTROPHORESIS, SERUM
A/G Ratio: 1.9 — ABNORMAL HIGH (ref 0.7–1.7)
Albumin ELP: 3.7 g/dL (ref 2.9–4.4)
Alpha-1-Globulin: 0.1 g/dL (ref 0.0–0.4)
Alpha-2-Globulin: 0.8 g/dL (ref 0.4–1.0)
Beta Globulin: 0.9 g/dL (ref 0.7–1.3)
Gamma Globulin: 0.2 g/dL — ABNORMAL LOW (ref 0.4–1.8)
Globulin, Total: 1.9 g/dL — ABNORMAL LOW (ref 2.2–3.9)
Total Protein ELP: 5.6 g/dL — ABNORMAL LOW (ref 6.0–8.5)

## 2018-12-03 LAB — KAPPA/LAMBDA LIGHT CHAINS
Kappa free light chain: 5.6 mg/L (ref 3.3–19.4)
Kappa, lambda light chain ratio: 2.55 — ABNORMAL HIGH (ref 0.26–1.65)
Lambda free light chains: 2.2 mg/L — ABNORMAL LOW (ref 5.7–26.3)

## 2018-12-04 ENCOUNTER — Telehealth: Payer: Self-pay

## 2018-12-04 NOTE — Telephone Encounter (Signed)
Informed patient M-Spike levels resulted at 0. Pt states understanding and denies any questions at this time.

## 2018-12-04 NOTE — Telephone Encounter (Signed)
-----   Message from Lequita Asal, MD sent at 12/04/2018  3:59 PM EST ----- Regarding: Please call patient  M-spike 0.  M ----- Message ----- From: Buel Ream, Lab [facility] [place] Sent: 12/02/2018   9:17 AM EST To: Lequita Asal, MD

## 2018-12-10 ENCOUNTER — Other Ambulatory Visit: Payer: Self-pay

## 2018-12-10 ENCOUNTER — Encounter: Payer: Self-pay | Admitting: Pain Medicine

## 2018-12-10 ENCOUNTER — Other Ambulatory Visit: Payer: Self-pay | Admitting: Hematology and Oncology

## 2018-12-10 ENCOUNTER — Ambulatory Visit: Payer: Medicare Other | Attending: Pain Medicine | Admitting: Pain Medicine

## 2018-12-10 ENCOUNTER — Other Ambulatory Visit: Payer: Self-pay | Admitting: Urgent Care

## 2018-12-10 VITALS — BP 132/82 | HR 102 | Temp 98.2°F | Ht 67.0 in | Wt 165.0 lb

## 2018-12-10 DIAGNOSIS — G894 Chronic pain syndrome: Secondary | ICD-10-CM

## 2018-12-10 DIAGNOSIS — C419 Malignant neoplasm of bone and articular cartilage, unspecified: Secondary | ICD-10-CM | POA: Diagnosis present

## 2018-12-10 DIAGNOSIS — Z451 Encounter for adjustment and management of infusion pump: Secondary | ICD-10-CM

## 2018-12-10 DIAGNOSIS — Z978 Presence of other specified devices: Secondary | ICD-10-CM | POA: Diagnosis present

## 2018-12-10 DIAGNOSIS — M62838 Other muscle spasm: Secondary | ICD-10-CM

## 2018-12-10 DIAGNOSIS — G893 Neoplasm related pain (acute) (chronic): Secondary | ICD-10-CM | POA: Diagnosis present

## 2018-12-10 DIAGNOSIS — C9001 Multiple myeloma in remission: Secondary | ICD-10-CM

## 2018-12-10 DIAGNOSIS — Z95828 Presence of other vascular implants and grafts: Secondary | ICD-10-CM | POA: Diagnosis present

## 2018-12-10 DIAGNOSIS — Z79891 Long term (current) use of opiate analgesic: Secondary | ICD-10-CM | POA: Diagnosis present

## 2018-12-10 DIAGNOSIS — S22080S Wedge compression fracture of T11-T12 vertebra, sequela: Secondary | ICD-10-CM | POA: Diagnosis present

## 2018-12-10 MED ORDER — BACLOFEN 10 MG PO TABS: 10.0000 mg | ORAL_TABLET | Freq: Two times a day (BID) | ORAL | 3 refills | Status: DC

## 2018-12-10 NOTE — Progress Notes (Signed)
Patient's Name: Logan Perez  MRN: [id number]  Referring Provider: Sofie Hartigan, MD  DOB: 04-23-1946  PCP: Sofie Hartigan, MD  DOS: 12/10/2018  Note by: Gaspar Cola, MD  Service setting: Ambulatory outpatient  Specialty: Interventional Pain Management  Patient type: Established  Location: [facility] (AMB) Pain Management Facility  Visit type: Interventional Procedure   Primary Reason for Visit: Interventional Pain Management Treatment. CC: No chief complaint on file.  Procedure:          Intrathecal Drug Delivery System (IDDS):  Type: Reservoir Refill [phone]) No rate change Region: Abdominal Laterality: Right  Type of Pump: Medtronic Synchromed II Delivery Route: Intrathecal Type of Pain Treated: Neuropathic/Nociceptive Primary Medication Class: Opioid/opiate  Medication, Concentration, Infusion Program, & Delivery Rate: Please see scanned programming printout.   Indications: 1. Chronic pain syndrome   2. Cancer associated pain   3. Multiple myeloma in remission ([place])   4. Malignant neoplasm of bone, unspecified location ([place])   5. Compression fracture of T12 vertebra, sequela   6. Presence of intrathecal pump   7. Presence of implanted infusion pump (Medtronic, programmable, intrathecal pump)   8. Long term current use of opiate analgesic   9. Encounter for adjustment or management of infusion pump   10. Night muscle spasms   11. Muscle spasticity    Pain Assessment: Self-Reported Pain Score: 0-No pain/10             Reported level is compatible with observation.        Intrathecal Pump Therapy Assessment  Manufacturer: Medtronic Synchromed Type: Programmable Volume: 40 mL reservoir MRI compatibility: Yes   Drug content: Primary Medication Class:Opioid Primary Medication:PF-Fentanyl (1500 mcg/mL)  Secondary Medication:PF-Bupivacaine (30 mg/mL)  Other Medication:PF-Clonidine (300 mcg/mL)    Programming:  Type: Simple continuous. See pump readout  for details.   Changes:  Medication Change: None at this point Rate Change: No change in rate  Reported side-effects or adverse reactions: None reported  Effectiveness: Described as relatively effective, allowing for increase in activities of daily living (ADL) Clinically meaningful improvement in function (CMIF): Sustained CMIF goals met  Plan: Pump refill today  Pre-op Assessment:  Logan Perez is a 73 y.o. (year old), male patient, seen today for interventional treatment. He  has a past surgical history that includes Knee arthroscopy (Left, 1992); Pain pump implantation (2012); stem cell implant (2008); Cataract extraction w/PHACO (Right, 05/02/2016); Cataract extraction w/PHACO (Left, 05/16/2016); Limbal stem cell transplant; PORTA CATH INSERTION (N/A, 12/04/2016); Colonoscopy with propofol (N/A, 03/22/2017); Esophagogastroduodenoscopy (egd) with propofol (03/22/2017); Eye surgery; Back surgery (1994); and Pain pump implantation (N/A, 09/04/2017). Logan Perez has a current medication list which includes the following prescription(s): acetaminophen, alprazolam, bismuth subsalicylate, budesonide, calcium-vitamin d, cetirizine, vitamin d3, daratumumab, dexamethasone, diltiazem, diltiazem, diphenhydramine, eliquis, ensure, fluticasone, carboxymethylcellulose sodium, loperamide, loratadine, lovastatin, montelukast, multiple vitamins-iron, NON FORMULARY, omeprazole, ondansetron, potassium chloride sa, tamsulosin, vitamin b-12, baclofen, and PAIN MANAGEMENT IT PUMP REFILL. His primarily concern today is the No chief complaint on file.  Initial Vital Signs:  Pulse/HCG Rate: (!) 102  Temp: 98.2 F (36.8 C) Resp:   BP: 132/82 SpO2: 100 %  BMI: Estimated body mass index is 25.84 kg/m as calculated from the following:   Height as of this encounter: 5' 7" (1.702 m).   Weight as of this encounter: 165 lb (74.8 kg).  Risk Assessment: Allergies: Reviewed. He is allergic to azithromycin; bee pollen; pollen  extract; zoledronic acid; and rivaroxaban.  Allergy Precautions: None required Coagulopathies: Reviewed. None  identified.  Blood-thinner therapy: None at this time Active Infection(s): Reviewed. None identified. Logan Perez is afebrile  Site Confirmation: Logan Perez was asked to confirm the procedure and laterality before marking the site Procedure checklist: Completed Consent: Before the procedure and under the influence of no sedative(s), amnesic(s), or anxiolytics, the patient was informed of the treatment options, risks and possible complications. To fulfill our ethical and legal obligations, as recommended by the American Medical Association's Code of Ethics, I have informed the patient of my clinical impression; the nature and purpose of the treatment or procedure; the risks, benefits, and possible complications of the intervention; the alternatives, including doing nothing; the risk(s) and benefit(s) of the alternative treatment(s) or procedure(s); and the risk(s) and benefit(s) of doing nothing.  Logan Perez was provided with information about the general risks and possible complications associated with most interventional procedures. These include, but are not limited to: failure to achieve desired goals, infection, bleeding, organ or nerve damage, allergic reactions, paralysis, and/or death.  In addition, he was informed of those risks and possible complications associated to this particular procedure, which include, but are not limited to: damage to the implant; failure to decrease pain; local, systemic, or serious CNS infections, intraspinal abscess with possible cord compression and paralysis, or life-threatening such as meningitis; bleeding; organ damage; nerve injury or damage with subsequent sensory, motor, and/or autonomic system dysfunction, resulting in transient or permanent pain, numbness, and/or weakness of one or several areas of the body; allergic reactions, either minor or major  life-threatening, such as anaphylactic or anaphylactoid reactions.  Furthermore, Logan Perez was informed of those risks and complications associated with the medications. These include, but are not limited to: allergic reactions (i.e.: anaphylactic or anaphylactoid reactions); endorphine suppression; bradycardia and/or hypotension; water retention and/or peripheral vascular relaxation leading to lower extremity edema and possible stasis ulcers; respiratory depression and/or shortness of breath; decreased metabolic rate leading to weight gain; swelling or edema; medication-induced neural toxicity; particulate matter embolism and blood vessel occlusion with resultant organ, and/or nervous system infarction; and/or intrathecal granuloma formation with possible spinal cord compression and permanent paralysis.  Before refilling the pump Logan Perez was informed that some of the medications used in the devise may not be FDA approved for such use and therefore it constitutes an off-label use of the medications.  Finally, he was informed that Medicine is not an exact science; therefore, there is also the possibility of unforeseen or unpredictable risks and/or possible complications that may result in a catastrophic outcome. The patient indicated having understood very clearly. We have given the patient no guarantees and we have made no promises. Enough time was given to the patient to ask questions, all of which were answered to the patient's satisfaction. Logan Perez has indicated that he wanted to continue with the procedure. Attestation: I, the ordering provider, attest that I have discussed with the patient the benefits, risks, side-effects, alternatives, likelihood of achieving goals, and potential problems during recovery for the procedure that I have provided informed consent. Date  Time: 12/10/2018 11:14 AM  Pre-Procedure Preparation:  Monitoring: As per clinic protocol. Respiration, ETCO2, SpO2, BP,  heart rate and rhythm monitor placed and checked for adequate function Safety Precautions: Patient was assessed for positional comfort and pressure points before starting the procedure. Time-out: I initiated and conducted the "Time-out" before starting the procedure, as per protocol. The patient was asked to participate by confirming the accuracy of the "Time Out" information. Verification of the correct person, site,   and procedure were performed and confirmed by me, the nursing staff, and the patient. "Time-out" conducted as per Joint Commission's Universal Protocol (UP.01.01.01). Time: 1100  Description of Procedure:          Position: Supine Target Area: Central-port of intrathecal pump. Approach: Anterior, 90 degree angle approach. Area Prepped: Entire Area around the pump implant. Prepping solution: ChloraPrep (2% chlorhexidine gluconate and 70% isopropyl alcohol) Safety Precautions: Aspiration looking for blood return was conducted prior to all injections. At no point did we inject any substances, as a needle was being advanced. No attempts were made at seeking any paresthesias. Safe injection practices and needle disposal techniques used. Medications properly checked for expiration dates. SDV (single dose vial) medications used. Description of the Procedure: Protocol guidelines were followed. Two nurses trained to do implant refills were present during the entire procedure. The refill medication was checked by both healthcare providers as well as the patient. The patient was included in the "Time-out" to verify the medication. The patient was placed in position. The pump was identified. The area was prepped in the usual manner. The sterile template was positioned over the pump, making sure the side-port location matched that of the pump. Both, the pump and the template were held for stability. The needle provided in the Medtronic Kit was then introduced thru the center of the template and into the  central port. The pump content was aspirated and discarded volume documented. The new medication was slowly infused into the pump, thru the filter, making sure to avoid overpressure of the device. The needle was then removed and the area cleansed, making sure to leave some of the prepping solution back to take advantage of its long term bactericidal properties. The pump was interrogated and programmed to reflect the correct medication, volume, and dosage. The program was printed and taken to the physician for approval. Once checked and signed by the physician, a copy was provided to the patient and another scanned into the EMR. Vitals:   12/10/18 1113  BP: 132/82  Pulse: (!) 102  Temp: 98.2 F (36.8 C)  SpO2: 100%  Weight: 165 lb (74.8 kg)  Height: 5' 7" (1.702 m)    Start Time: 1100 hrs. End Time: 1110 hrs. Materials & Medications: Medtronic Refill Kit Medication(s): Please see chart orders for details.  Imaging Guidance:          Type of Imaging Technique: None used Indication(s): N/A Exposure Time: No patient exposure Contrast: None used. Fluoroscopic Guidance: N/A Ultrasound Guidance: N/A Interpretation: N/A  Antibiotic Prophylaxis:   Anti-infectives (From admission, onward)   None     Indication(s): None identified  Post-operative Assessment:  Post-procedure Vital Signs:  Pulse/HCG Rate: (!) 102  Temp: 98.2 F (36.8 C) Resp:   BP: 132/82 SpO2: 100 %  EBL: None  Complications: No immediate post-treatment complications observed by team, or reported by patient.  Note: The patient tolerated the entire procedure well. A repeat set of vitals were taken after the procedure and the patient was kept under observation following institutional policy, for this type of procedure. Post-procedural neurological assessment was performed, showing return to baseline, prior to discharge. The patient was provided with post-procedure discharge instructions, including a section on how to  identify potential problems. Should any problems arise concerning this procedure, the patient was given instructions to immediately contact us, at any time, without hesitation. In any case, we plan to contact the patient by telephone for a follow-up status report regarding this interventional procedure.    Comments:  No additional relevant information.  Plan of Care   Imaging Orders  No imaging studies ordered today    Procedure Orders     PUMP REFILL     PUMP REFILL  Medications ordered for procedure: Meds ordered this encounter  Medications  . baclofen (LIORESAL) 10 MG tablet    Sig: Take 1 tablet (10 mg total) by mouth 2 (two) times daily.    Dispense:  180 tablet    Refill:  3    Do not place this medication, or any other prescription from our practice, on "Automatic Refill". Patient may have prescription filled one day early if pharmacy is closed on scheduled refill date.   Medications administered: Logan A. Astorino "Rick" had no medications administered during this visit.  See the medical record for exact dosing, route, and time of administration.  Disposition: Discharge home  Discharge Date & Time: 12/10/2018; 1147 hrs.   Physician-requested Follow-up: Return for Pump Refill (as per pump program) (Max:3mo).  Future Appointments  Date Time Provider Department Center  12/30/2018  9:15 AM [facility] INFUSION CHAIR 2 [facility] None  12/30/2018  9:30 AM Corcoran, Melissa C, MD [facility] None  12/30/2018 10:00 AM [facility] INFUSION CHAIR 2 [facility] None  03/25/2019 11:00 AM Naveira, Francisco, MD [facility] None  05/05/2019  9:00 AM [facility] None  05/07/2019  8:30 AM McGowan, Shannon A, PA-C [facility] None   Primary Care Physician: Feldpausch, Dale E, MD Location: [facility] Note by: Francisco A Naveira, MD Date: 12/10/2018; Time: 12:35 PM  Disclaimer:  Medicine is not an exact science. The only guarantee in medicine is that nothing is guaranteed. It  is important to note that the decision to proceed with this intervention was based on the information collected from the patient. The Data and conclusions were drawn from the patient's questionnaire, the interview, and the physical examination. Because the information was provided in large part by the patient, it cannot be guaranteed that it has not been purposely or unconsciously manipulated. Every effort has been made to obtain as much relevant data as possible for this evaluation. It is important to note that the conclusions that lead to this procedure are derived in large part from the available data. Always take into account that the treatment will also be dependent on availability of resources and existing treatment guidelines, considered by other Pain Management Practitioners as being common knowledge and practice, at the time of the intervention. For Medico-Legal purposes, it is also important to point out that variation in procedural techniques and pharmacological choices are the acceptable norm. The indications, contraindications, technique, and results of the above procedure should only be interpreted and judged by a Board-Certified Interventional Pain Specialist with extensive familiarity and expertise in the same exact procedure and technique. 

## 2018-12-10 NOTE — Patient Instructions (Signed)
Opioid Overdose Opioids are substances that relieve pain by binding to pain receptors in your brain and spinal cord. Opioids include illegal drugs, such as heroin, as well as prescription pain medicines.An opioid overdose happens when you take too much of an opioid substance. This can happen with any type of opioid, including:  Heroin.  Morphine.  Codeine.  Methadone.  Oxycodone.  Hydrocodone.  Fentanyl.  Hydromorphone.  Buprenorphine. The effects of an overdose can be mild, dangerous, or even deadly. Opioid overdose is a medical emergency. What are the causes? This condition may be caused by:  Taking too much of an opioid by accident.  Taking too much of an opioid on purpose.  An error made by a health care provider who prescribes a medicine.  An error made by the pharmacist who fills the prescription order.  Using more than one substance that contains opioids at the same time.  Mixing an opioid with a substance that affects your heart, breathing, or blood pressure. These include alcohol, tranquilizers, sleeping pills, illegal drugs, and some over-the-counter medicines. What increases the risk? This condition is more likely in:  Children. They may be attracted to colorful pills. Because of a child's small size, even a small amount of a drug can be dangerous.  Elderly people. They may be taking many different drugs. Elderly people may have difficulty reading labels or remembering when they last took their medicine.  People who take an opioid on a long-term basis.  People who use: ? Illegal drugs. ? Other substances, including alcohol, while using an opioid.  People who have: ? A history of drug or alcohol abuse. ? Certain mental health conditions.  People who take opioids that are not prescribed for them. What are the signs or symptoms? Symptoms of this condition depend on the type of opioid and the amount that was taken. Common symptoms include:  Sleepiness  or difficulty waking from sleep.  Confusion.  Slurred speech.  Slowed breathing and a slow pulse.  Nausea and vomiting.  Abnormally small pupils. Signs and symptoms that require emergency treatment include:  Cold, clammy, and pale skin.  Blue lips and fingernails.  Vomiting.  Gurgling sounds in the throat.  A pulse that is very slow or difficult to detect.  Breathing that is very slow, noisy, or difficult to detect.  Limp body.  Inability to respond to speech or be awakened from sleep (stupor). How is this diagnosed? This condition is diagnosed based on your symptoms. It is important to tell your health care provider:  All of the opioidsthat you took.  When you took the opioids.  Whether you were drinking alcohol or using other substances. Your health care provider will do a physical exam. This exam may include:  Checking and monitoring your heart rate and rhythm, your breathing rate and depth, your temperature, and your blood pressure (vital signs).  Checking for abnormally small pupils.  Measuring oxygen levels in your blood. You may also have blood tests or urine tests. How is this treated? Supporting your vital signs and your breathing is the first step in treating an opioid overdose. Treatment may also include:  Giving fluids and minerals (electrolytes) through an IV tube.  Inserting a breathing tube (endotracheal tube) in your airway to help you breathe.  Giving oxygen.  Passing a tube through your nose and into your stomach (NG tube, or nasogastric tube) to wash out your stomach.  Giving medicines that: ? Increase your blood pressure. ? Absorb any opioid that   is in your digestive system. ? Reverse the effects of the opioid (naloxone).  Ongoing counseling and mental health support if you intentionally overdosed or used an illegal drug. Follow these instructions at home:   Take over-the-counter and prescription medicines only as told by your  health care provider. Always ask your health care provider about possible side effects and interactions of any new medicine that you start taking.  Keep a list of all of the medicines that you take, including over-the-counter medicines. Bring this list with you to all of your medical visits.  Drink enough fluid to keep your urine clear or pale yellow.  Keep all follow-up visits as told by your health care provider. This is important. How is this prevented?  Get help if you are struggling with: ? Alcohol or drug use. ? Depression or another mental health problem.  Keep the phone number of your local poison control center near your phone or on your cell phone.  Store all medicines in safety containers that are out of the reach of children.  Read the drug inserts that come with your medicines.  Do not drink alcohol when taking opioids.  Do not use illegal drugs.  Do not take opioid medicines that are not prescribed for you. Contact a health care provider if:  Your symptoms return.  You develop new symptoms or side effects when you are taking medicines. Get help right away if:  You think that you or someone else may have taken too much of an opioid. The hotline of the National Poison Control Center is (800) 222-1222.  You or someone else is having symptoms of an opioid overdose.  You have serious thoughts about hurting yourself or others.  You have: ? Chest pain. ? Difficulty breathing. ? A loss of consciousness. Opioid overdose is an emergency. Do not wait to see if the symptoms will go away. Get medical help right away. Call your local emergency services (911 in the [place] Do not drive yourself to the hospital. This information is not intended to replace advice given to you by your health care provider. Make sure you discuss any questions you have with your health care provider. Document Released: 11/02/2004 Document Revised: 04/12/2017 Document Reviewed: 03/11/2015 Elsevier  Interactive Patient Education  2019 [employer]  

## 2018-12-11 ENCOUNTER — Telehealth: Payer: Self-pay

## 2018-12-11 NOTE — Telephone Encounter (Signed)
Post procedure phone call.  LM 

## 2018-12-27 ENCOUNTER — Other Ambulatory Visit: Payer: Self-pay

## 2018-12-29 ENCOUNTER — Other Ambulatory Visit: Payer: Self-pay

## 2018-12-29 ENCOUNTER — Other Ambulatory Visit: Payer: Self-pay | Admitting: Hematology and Oncology

## 2018-12-29 MED ORDER — VALACYCLOVIR HCL 500 MG PO TABS: 500.0000 mg | ORAL_TABLET | Freq: Every day | ORAL | 3 refills | Status: DC

## 2018-12-29 NOTE — Progress Notes (Signed)
[facility] [address] [place],  [address] Phone: [phone]  Fax: [phone]     Clinic day:  12/30/18  Chief Complaint: Logan Perez is a 73 y.o. 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  HPI: The patient was last seen in the medical oncology clinic on 12/02/2018.  At that time, he was doing well.  Exam was unremarkable. M-spike was 0.  He received cycle #25 daratumumab.  During the interim, patient complains of increased neuropathy in the bottom of his feet. Patient state's "the sensitivity is just gone". Patient with fears of an impending fall citing that he can step on extension cords and "not even know it". He notes that the previously reported pain in his LEFT leg has resolved. No adjustments were made to his Baclofen pump by pain management. He is using PRN doses of APAP to supplement his pain regimen.   Patient denies that he has experienced any B symptoms. He denies any interval infections. Bowels have been well managed since his last visit with PRN dose of loperamide. He denies any associated nausea, vomiting, or abdominal pain.    Patient advises that he maintains an adequate appetite. He is eating well. Weight today is 170 lb 13.7 oz (77.5 kg), which compared to his last visit to the clinic, represents a 4 pound increase.   Patient denies pain in the clinic today.   Past Medical History:  Diagnosis Date   Anxiety    Atrial fibrillation (HCC)    BPH (benign prostatic hyperplasia)    Complication of anesthesia    BAD HEADACHE NIGHT OF FIRST CATARACT   Compression fracture of lumbar vertebra (HCC)    Difficulty voiding    Dysrhythmia    A FIB   Elevated PSA    GERD (gastroesophageal reflux disease)    Hearing aid worn    bilateral   History of kidney  stones    HLD (hyperlipidemia)    HOH (hard of hearing)    Hypertension    Multiple myeloma (HCC)    Neuropathy    feet. 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   Pain    BACK   Palpitations    Pneumonia    Pulmonary embolism (HCC)    Sepsis (HCC)    Stroke (HCC)    TIA, detected on CT scan. pt was unaware    Past Surgical History:  Procedure Laterality Date   BACK SURGERY  1994   CATARACT EXTRACTION W/PHACO Right 05/02/2016   Procedure: CATARACT EXTRACTION PHACO AND INTRAOCULAR LENS PLACEMENT (IOC);  Surgeon: Birder Robson, MD;  Location: [facility];  Service: Ophthalmology;  Laterality: Right;  [place] 1.06 AP% 20.6 CDE 13.70 FLUID PACK LOT # [id number]   CATARACT EXTRACTION W/PHACO Left 05/16/2016   Procedure: CATARACT EXTRACTION PHACO AND INTRAOCULAR LENS PLACEMENT (IOC);  Surgeon: Birder Robson, MD;  Location: [facility];  Service: Ophthalmology;  Laterality: Left;  [place] 01:43 AP% 19.8 CDE 20.45 FLUID PACK LOT #[id number] H   COLONOSCOPY WITH PROPOFOL N/A 03/22/2017   Procedure: COLONOSCOPY WITH PROPOFOL;  Surgeon: Lucilla Lame, MD;  Location: [place];  Service: Endoscopy;  Laterality: N/A;  has port   ESOPHAGOGASTRODUODENOSCOPY (EGD) WITH PROPOFOL  03/22/2017   Procedure: ESOPHAGOGASTRODUODENOSCOPY (EGD) WITH PROPOFOL;  Surgeon: Lucilla Lame, MD;  Location: [place];  Service: Endoscopy;;   EYE SURGERY  KNEE ARTHROSCOPY Left 1992   LIMBAL STEM CELL TRANSPLANT     PAIN PUMP IMPLANTATION  2012   PAIN PUMP IMPLANTATION N/A 09/04/2017   Procedure: INTRATHECAL PUMP BATTERY CHANGE;  Surgeon: Milinda Pointer, MD;  Location: [facility];  Service: Neurosurgery;  Laterality: N/A;   PORTA CATH INSERTION N/A 12/04/2016   Procedure: Glori Luis Cath Insertion;  Surgeon: Algernon Huxley, MD;  Location: [place] [facility] [facility];  Service: Cardiovascular;  Laterality: N/A;   stem cell implant  2008   [facility]    Family History  Problem Relation Age of Onset   Cancer Father         throat   Kidney disease Sister    Stroke Other    Stroke Mother    Bladder Cancer Neg Hx    Prostate cancer Neg Hx    Kidney cancer Neg Hx     Social History:  reports that he quit smoking about 43 years ago. His smoking use included cigarettes. He has a 30.00 pack-year smoking history. He has never used smokeless tobacco. He reports that he does not drink alcohol or use drugs.  He has a wire haired dachshund.  He lives in [place].  His wife's name is [name].  The patient is alone today.  Allergies:  Allergies  Allergen Reactions   Azithromycin Diarrhea and Other (See Comments)    Possible cause of C. Diff Possible cause of C. Diff Possible cause of C. Diff Possible cause of C. Diff   Bee Pollen Other (See Comments)    Sneezing, watery eyes, runny nose   Pollen Extract Other (See Comments)    Sneezing, watery eyes, runny nose   Zoledronic Acid Other (See Comments)    ONG- Osteonecrosis of the jaw  Osteonecrosis of the jaw   Rivaroxaban Rash    Current Medications: Current Outpatient Medications  Medication Sig Dispense Refill   baclofen (LIORESAL) 10 MG tablet Take 1 tablet (10 mg total) by mouth 2 (two) times daily. 180 tablet 3   budesonide (ENTOCORT EC) 3 MG 24 hr capsule Take 3 capsules (9 mg total) by mouth every morning. 90 capsule 6   Calcium Carb-Cholecalciferol (CALCIUM-VITAMIN D) 500-400 MG-UNIT TABS Take 1 tablet daily by mouth.     Cholecalciferol (VITAMIN D3) 2000 units capsule Take 2,000 Units daily by mouth.      Daratumumab (DARZALEX IV) Inject 1,200 mg every 30 (thirty) days into the vein.      diltiazem (CARDIZEM CD) 120 MG 24 hr capsule Take 120 mg daily by mouth.      diphenhydrAMINE (BENADRYL) 25 MG tablet Take 25 mg by mouth as directed. With chemo treatment     ELIQUIS 5 MG TABS tablet TAKE 1 TABLET BY MOUTH TWICE DAILY 60 tablet 0   ENSURE (ENSURE) Take 237 mLs by mouth daily.     Hypromellose (ARTIFICIAL TEARS OP) Place 1 drop as  needed into both eyes (for dry eyes).     loratadine (CLARITIN) 10 MG tablet Take 10 mg by mouth daily.     lovastatin (MEVACOR) 20 MG tablet Take 20 mg by mouth every evening.      Multiple Vitamins-Iron (MULTIVITAMIN/IRON PO) Take 1 tablet daily by mouth.     omeprazole (PRILOSEC) 20 MG capsule Take 20 mg by mouth daily.      PAIN MANAGEMENT IT PUMP REFILL 1 each by Intrathecal route once for 1 dose. Medication: PF Fentanyl 1,500.0 mcg/ml PF Bupivicaine 30.0 mg/ml PF Clonidine 300.0 mcg/ml Total Volume:  40 ml Needed by 12-10-2018 @ 1000 1 each 0   potassium chloride SA (K-DUR,KLOR-CON) 20 MEQ tablet TAKE 1 TABLET BY MOUTH DAILY 90 tablet 0   tamsulosin (FLOMAX) 0.4 MG CAPS capsule Take 1 capsule (0.4 mg total) by mouth daily. 90 capsule 4   valACYclovir (VALTREX) 500 MG tablet Take 1 tablet (500 mg total) by mouth daily. 30 tablet 3   vitamin B-12 (CYANOCOBALAMIN) 1000 MCG tablet Take 1,000 mcg daily by mouth.      acetaminophen (TYLENOL) 325 MG tablet Take 650 mg every 6 (six) hours as needed by mouth for mild pain or headache.      ALPRAZolam (XANAX) 0.25 MG tablet TAKE 1 TABLET BY MOUTH AT BEDTIME AS NEEDED (Patient not taking: Reported on 12/30/2018) 30 tablet 0   bismuth subsalicylate (PEPTO BISMOL) 262 MG/15ML suspension Take 30 mLs by mouth every 6 (six) hours as needed for diarrhea or loose stools.     cetirizine (ZYRTEC) 10 MG tablet Take 10 mg by mouth daily as needed for allergies.      dexamethasone (DECADRON) 4 MG tablet TAKE 4 TABLETS BY MOUTH 1 HOUR PRIOR TO INFUSION, THEN 1 TABLET ON DAYS 2 AND 3 AS DIRECTED (Patient not taking: Reported on 12/30/2018) 30 tablet 0   diltiazem (CARDIZEM) 60 MG tablet Take 60 mg daily as needed by mouth (for increased heart rate > 140).      fluticasone (FLONASE) 50 MCG/ACT nasal spray Place 1 spray into the nose daily as needed for allergies.      loperamide (IMODIUM) 2 MG capsule Take 4 mg as needed by mouth for diarrhea or loose  stools.      montelukast (SINGULAIR) 10 MG tablet TAKE 1 TABLET BY MOUTH DAILY THE DAY BEFORE THE INFUSION, THEN DAY OF AND THE 2 DAYS AFTER INFUSION (Patient not taking: Reported on 12/30/2018) 30 tablet 0   NON FORMULARY IT pump  Fentanyl 1,500.0 mcg/ml Bupivicaine 30.0 mg/ml Clonidine 300.0 mcg/ml Rate 502.2 mcg/day     ondansetron (ZOFRAN) 8 MG tablet Take 1 tablet (8 mg total) by mouth as directed. Take with chemo and can take it as needed (Patient not taking: Reported on 12/30/2018) 20 tablet 1   No current facility-administered medications for this visit.    Facility-Administered Medications Ordered in Other Visits  Medication Dose Route Frequency Provider Last Rate Last Dose   heparin lock flush 100 unit/mL  500 Units Intravenous Once Kole Hilyard C, MD       sodium chloride flush (NS) 0.9 % injection 10 mL  10 mL Intravenous PRN Lequita Asal, MD   10 mL at 12/30/18 6808    Review of Systems  Constitutional: Negative for chills, diaphoresis, fever, malaise/fatigue and weight loss (up 4 pounds).       "I am feeling ok".  HENT: Negative.  Negative for congestion, ear discharge, ear pain, nosebleeds, sinus pain, sore throat and tinnitus.   Eyes: Negative.  Negative for blurred vision, double vision, photophobia and pain.  Respiratory: Negative.  Negative for cough, hemoptysis, sputum production and shortness of breath.   Cardiovascular: Negative.  Negative for chest pain, palpitations, orthopnea, leg swelling and PND.       PMH (+) atrial fibrillation.  Gastrointestinal: Positive for diarrhea (chronic; managed with PRN loperamide). Negative for abdominal pain, blood in stool, constipation, heartburn, melena, nausea and vomiting.  Genitourinary: Negative.  Negative for dysuria, frequency, hematuria and urgency.  Musculoskeletal: Positive for back pain (chronic; Baclofen pump). Negative for  falls, joint pain, myalgias and neck pain.  Skin: Negative.  Negative for itching  and rash.  Neurological: Positive for sensory change (neuropathy in fingers and toes). Negative for dizziness, tremors, focal weakness, weakness and headaches.  Endo/Heme/Allergies: Positive for environmental allergies (seasonal allergies). Does not bruise/bleed easily.  Psychiatric/Behavioral: Negative.  Negative for depression and memory loss. The patient is not nervous/anxious and does not have insomnia.   All other systems reviewed and are negative.  Performance status (ECOG): 1 - Symptomatic but completely ambulatory  Vital signs BP 122/82 (BP Location: Left Arm, Patient Position: Sitting)    Pulse 84    Resp 18    Ht '5\' 7"'  (1.702 m)    Wt 170 lb 13.7 oz (77.5 kg)    SpO2 97%    BMI 26.76 kg/m   Physical Exam  Constitutional: He is oriented to person, place, and time and well-developed, well-nourished, and in no distress.  HENT:  Head: Normocephalic and atraumatic.  Mouth/Throat: Oropharynx is clear and moist and mucous membranes are normal.  Gray hair.  Goatee.  Eyes: Pupils are equal, round, and reactive to light. Conjunctivae and EOM are normal. No scleral icterus.  Glasses.  Blue.  Neck: Normal range of motion. Neck supple. No JVD present.  Cardiovascular: Normal rate, regular rhythm, normal heart sounds and intact distal pulses. Exam reveals no gallop and no friction rub.  No murmur heard. Pulmonary/Chest: Effort normal and breath sounds normal. No respiratory distress. He has no wheezes. He has no rales.  Abdominal: Soft. Bowel sounds are normal. He exhibits no distension and no mass. There is no abdominal tenderness. There is no rebound and no guarding.  Musculoskeletal: Normal range of motion.        General: No tenderness or edema.     Comments: Implanted Baclofen pump  Lymphadenopathy:    He has no cervical adenopathy.    He has no axillary adenopathy.       Right: No inguinal and no supraclavicular adenopathy present.       Left: No inguinal and no supraclavicular  adenopathy present.  Neurological: He is alert and oriented to person, place, and time. Gait normal.  Skin: Skin is warm and dry. No rash noted. No erythema. No pallor.  Skin graft to tip of nose following Moh's surgery.  Psychiatric: Mood, affect and judgment normal.  Nursing note and vitals reviewed.    Infusion on 12/30/2018  Component Date Value Ref Range Status   Sodium 12/30/2018 135  135 - 145 mmol/L Final   Potassium 12/30/2018 3.7  3.5 - 5.1 mmol/L Final   Chloride 12/30/2018 104  98 - 111 mmol/L Final   CO2 12/30/2018 23  22 - 32 mmol/L Final   Glucose, Bld 12/30/2018 149* 70 - 99 mg/dL Final   BUN 12/30/2018 18  8 - 23 mg/dL Final   Creatinine, Ser 12/30/2018 1.36* 0.61 - 1.24 mg/dL Final   Calcium 12/30/2018 8.7* 8.9 - 10.3 mg/dL Final   Total Protein 12/30/2018 6.1* 6.5 - 8.1 g/dL Final   Albumin 12/30/2018 3.8  3.5 - 5.0 g/dL Final   AST 12/30/2018 27  15 - 41 U/L Final   ALT 12/30/2018 15  0 - 44 U/L Final   Alkaline Phosphatase 12/30/2018 43  38 - 126 U/L Final   Total Bilirubin 12/30/2018 0.6  0.3 - 1.2 mg/dL Final   GFR calc non Af Amer 12/30/2018 52* >60 mL/min Final   GFR calc Af Amer 12/30/2018 60* >60 mL/min  Final   Anion gap 12/30/2018 8  5 - 15 Final   Performed at [facility], [address] [place], [place] [address]   WBC 12/30/2018 7.7  4.0 - 10.5 K/uL Final   RBC 12/30/2018 3.40* 4.22 - 5.81 MIL/uL Final   Hemoglobin 12/30/2018 11.7* 13.0 - 17.0 g/dL Final   HCT 12/30/2018 34.4* 39.0 - 52.0 % Final   MCV 12/30/2018 101.2* 80.0 - 100.0 fL Final   MCH 12/30/2018 34.4* 26.0 - 34.0 pg Final   MCHC 12/30/2018 34.0  30.0 - 36.0 g/dL Final   RDW 12/30/2018 14.2  11.5 - 15.5 % Final   Platelets 12/30/2018 187  150 - 400 K/uL Final   nRBC 12/30/2018 0.0  0.0 - 0.2 % Final   Neutrophils Relative % 12/30/2018 75  % Final   Neutro Abs 12/30/2018 5.8  1.7 - 7.7 K/uL Final   Lymphocytes Relative 12/30/2018 18  % Final    Lymphs Abs 12/30/2018 1.4  0.7 - 4.0 K/uL Final   Monocytes Relative 12/30/2018 5  % Final   Monocytes Absolute 12/30/2018 0.4  0.1 - 1.0 K/uL Final   Eosinophils Relative 12/30/2018 2  % Final   Eosinophils Absolute 12/30/2018 0.1  0.0 - 0.5 K/uL Final   Basophils Relative 12/30/2018 0  % Final   Basophils Absolute 12/30/2018 0.0  0.0 - 0.1 K/uL Final   Immature Granulocytes 12/30/2018 0  % Final   Abs Immature Granulocytes 12/30/2018 0.03  0.00 - 0.07 K/uL Final   Performed at [facility], [address] [place], [place] [address]  Office Visit on 12/30/2018  Component Date Value Ref Range Status   Magnesium 12/30/2018 1.6* 1.7 - 2.4 mg/dL Final   Performed at [facility], [address], [place], [place] [address]    Assessment:  Logan Perez is a 73 y.o. male with stage III mutiple myeloma.  He initially presented with progressive back pain beginning in 12/2006.  MRI revealed "spots and compression fractures".  He began Velcade, thalidomide, and Decadron.  In 08/2007, he underwent high dose chemotherapy and autologous stem cell transplant.  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  He recurred with a rising M-spike (2.7) with repeat M spike (1.7 gm/dl) in 03/2010.  He was initially treated with Velcade (02/08/2010 - 05/10/2010).  He then began Revlimid (15 mg 3 weeks on/1 week off) and Decadron (40 mg on day 1, 8, 15, 22).  Because of significant side effect with Decadron his dose was decreased to 10 mg once a week in 07/2010.    He was on maintenance Revlimid. Revlimid was initially10 mg 3 weeks on/1 week off. This was changed to 10 mg 2 weeks on/2 weeks off secondary to right nipple tenderness. His dose was increased to 10 mg 3 weeks on/1 week off with Decadron 10 mg a week (on Sundays) and then Revlamid 15 mg 3 weeks on and 1 week off with Decadron on Sundays.  He began Pomalyst 4 mg 3 weeks on/1 week off with  Decadron on 08/27/2015.  Over the past year his SPEP has revealed no monoclonal protein (04/21/2015) and 0.5 gm/dL on 09/22/2015 and 10/20/2015. M spike was 0.1 on 02/02/2016, 03/01/2016, 03/29/2016, 04/26/2016, and 05/24/2016.  M spike was 0 on 10/11/2016, 11/28/2016, 02/05/2017, 03/21/2017, 08/02/2017, 10/04/2017, 12/27/2017, 01/24/2018, and 02/21/2018.  M spike was 0.1 on 11/01/2017, 0.1 on 11/29/2017, 0 on 12/27/2017, 0 on 01/24/2018, 0 on 02/21/2018, 0.1 on  03/21/2018, 0.3 on 04/18/2018, 0.1 on 05/16/2018, 0 on 06/13/2018, 0.2 on 08/08/2018, 0 on 09/09/2018, 0.1 on 11/04/2018, 0 on 12/02/2018, and 0.1 on 12/30/2018.  Free light chains have been monitored. Kappa free light chains were 18.54 on 11/21/2013, 18.37 on 02/20/2014, 18.93 on 05/22/2014, 32.58 (high; normal ratio 1.27) on 08/21/2014, 51.53 (high; elevated ratio of 2.12) on 11/13/2014, 28.08 (ratio 1.73) on 12/09/2014, 23.71 (ratio 2.17) on 01/18/2015, 92.93 (ratio 9.49) on 04/21/2015, 93.44 (ratio 10.28) on 05/26/2015, 255.45 (ratio of 24.05) on 07/14/2015, 373.89 (ratio 48.31) on 08/04/2015, 474.33 (ratio 70.58) on 08/25/2015, 450.76 (ratio 55.44) on 09/22/2015, 453.4 (ratio 59.89) on 10/20/2015, 58.26 (ratio of > 40.74) on 02/02/2016, 62.67 (ratio of 37.98) on 03/01/2016, 75.7 (ratio > 50.47) on 03/29/2016, 79.7 (ratio 53.13) on 04/26/2016, 108.6 (ratio > 72.4) on 05/24/2016, 8.3 (1.46 ratio) on 10/11/2016, 6.7 (0.81 ratio) on 02/05/2017, 7.8 (1.01 ratio) on 03/21/2017,7.6 (ratio 0.63) on 06/01/2017, 8.1 (ratio 1.13) on 11/29/2017, 9.6 (ratio 1.55) on 06/13/2018, 6.6 (ratio 2.06) on 07/11/2018, 6.9 (ratio 0.82) on 08/08/2018, 5.8 (ratio 1.66) on 09/09/2018, 5.8 (ratio 1.05) on 11/04/2018, 5.6 (ratio 2.55 on 12/02/2018).  Bone survey on 12/08/2014 was stable.  Bone survey on 10/21/2015 revealed increase conspicuity of subcentimeter lytic lesions in the calvarium.  Bone marrow aspirate and biopsy on 11/04/2015 revealed an atypical monoclonal  plasma cells estimated at 30-40% of marrow cells.   Marrow was variably cellular (approximately 45%) with background trilineage hematopoiesis. There was no significant increase in marrow reticulin fibers. Storage iron was present.    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 He develoed herpes zoster in 04/2008. He developed a pulmonary embolism in 05/2013. He was initially on Xarelto, but is now on Eliquis. He had an episode of pneumonia around this time requiring a brief admission. He developed severe lower leg cramps on 08/07/2014 secondary to hypokalemia. Duplex was negative.   He was treated for C difficile colitis (Flagyl completed 07/30/2015).  He has a chronic indwelling pain pump.  He received 4 cycles of Pomalyst and Decadron (08/27/2015 - 11/19/2015).  Restaging studies document progressive disease.  Kappa free light chains are increasing.  SPEP revealed 0.5 gm/dL monoclonal protein then 1.3 gm/dL.  Bone survey reveals increase conspicuity of subcentimeter lytic lesions in the calvarium.  Bone marrow reveals 30-40% plasma cells.   MUGA on 11/30/2015 revealed an ejection fraction of 46%.  He is felt not to be a good candidate for Kyprolis.  There were no focal wall motion abnormalities. He had a stress echo less than 1 year ago.  He has a history of PVCs and atrial fibrillation.  He takes Cardizem prn.  He received 17 weeks of daratumumab (Darzalex) (12/09/2015 - 05/25/2016).  He tolerated treatment well without side effect.  Bone marrow on 02/09/2016 revealed no diagnostic morphologic evidence of plasma cell myeloma.  Marrow was normocellular to hypocellular marrow for age (ranging from 10-40%) with maturing trilineage hematopoiesis and mild multilineage dyspoiesis.  There was patchy mild increase in reticulin.  Storage iron was present.  Flow cytometry revealed no definitive evidence of monoclonality.  There was a non-specific  atypical myeloid and monocytic findings with no increase in blasts.  Cytogenetics were normal (46, XY).  He is currently 31 months s/p 2nd autologous stem cell transplant on 06/16/2016.  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  He also experienced atrial fibrillation with intermittent episodes of RVR requiring IV beta blockers.  He is on prophylactic valacyclovir for 1 year post transplant.  He started vaccinations (DTaP-Pediatric triple vaccine, Hep B- Pediatrix triple vaccine, Haemophilus influenza B (Hib), inactivated polio virus (IPV), pneumococcal conjugate vaccine 13-valent (PCV 13)) on 04/17/2017.  Recommendation was for Shingrix vaccine to be given in [place] and second dose of Shingrix in 2 months.  He had follow-up vaccinations on 08/28/2017.  He had his second shingles vaccine. He received 18 month vaccinations - DTaP-Pediatric triple vaccine, Hep B- Pediatrix triple vaccine, Haemophilus influenza B (Hib), inactivated polio virus (IPV), pneumococcal conjugate vaccine 13-valent (PCV 13) on 02/05/2018.  He is s/p 25 cycles of Daratumumab (Darzalex) post transplant (12/05/2016 - 12/02/2018).  PET scan on 08/14/2018 revealed no evidence of active myeloma on whole-body FDG PET scan.  There was no evidence soft tissue plasmacytoma.  There was no clear evidence of lytic lesions on the CT portion exam. Some lucencies in the pelvis were stable. There were chronic compression fractures in the lower thoracic spine.  He has a history of hypomagnesemia that required IV magnesium (2-4 gm) weekly.  He has not required magnesium since 10/24/2016.  Symptomatically, he is doing well overall.  He notes significant neuropathy in his feet.  Patient states, "the sensitivity is just gone".  Neuropathy in his hands is stable and not affecting function.  No nausea, vomiting, or abdominal pain.  Bowels are well managed with PRN loperamide dosing.  No B symptoms  or recent infections.  Exam is grossly unremarkable.  WBC 7700 ([place] 5800).  Hemoglobin 11.7, hematocrit 34.4, MCV 104.2, and platelets 187,000.  Creatinine 1.36 mg/dL.  Calcium 8.7 mg/dL.  Magnesium 1.6 mg/dL.  SPEPis 0.1 g/dL.  Plan:  1. Labs today :  CBC with diff, CMP, SPEP, Mg. 2. Multiple myeloma  Clinically doing well.  No recent infections.  M spike has been stable in review of recent past results.  SPEP 0.1 today.  Labs reviewed. Blood counts stable and adequate enough for treatment. Will proceed with cycle #26 daratumumab.  Patient took premedications at home prior to arrival.  Continue daily valacyclovir prophylaxis until 3 months s/p completion of daratumumab.  Patient has antiemetics and pain medications at home to use on a PRN basis. 3. 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 He notes that his neuropathy does not impose any functional limitations, however he notes concerns with regards to safe ambulation.  Previous numbness to bilateral lower extremities has resolved without treatment.  Plans to discuss further with pain management provider at next visit. 4. Hypomagnesemia  Discuss low magnesium. Magnesium 1.6 mg/dL.   Will replace with 2 grams intravenous magnesium today. 5. Chronic diarrhea  Stable on PRN loperamide dosing.  Previous biopsies suggest possible Crohn's disease.  Follow-up with gastroenterology as already scheduled.. 6. Atrial fibrillation  Patient with rate controlled A. fib with no recent RVR episodes.  Continues on apixaban as prescribed.  Follow-up with cardiology as already scheduled. 7. RTC in 4 weeks for MD assessment, labs (CBC with diff, CMP, Mg, SPEP, FLCA), and cycle #27 daratumumab.   Honor Loh 12/30/18, 10:06 AM    I saw and evaluated the patient, participating in the key portions of the service and reviewing pertinent diagnostic studies and records.  I reviewed the nurse practitioner's note and agree  with the findings and the plan.  The assessment and plan were discussed with the patient.  Several questions were asked by the patient and answered.   Nolon Stalls, MD 12/30/18, 10:06 AM

## 2018-12-30 ENCOUNTER — Inpatient Hospital Stay: Payer: Medicare Other

## 2018-12-30 ENCOUNTER — Inpatient Hospital Stay: Payer: Medicare Other | Attending: Hematology and Oncology | Admitting: Hematology and Oncology

## 2018-12-30 ENCOUNTER — Encounter: Payer: Self-pay | Admitting: Hematology and Oncology

## 2018-12-30 ENCOUNTER — Other Ambulatory Visit: Payer: Self-pay

## 2018-12-30 VITALS — BP 129/78 | HR 79 | Resp 18

## 2018-12-30 VITALS — BP 122/82 | HR 84 | Resp 18 | Ht 67.0 in | Wt 170.9 lb

## 2018-12-30 DIAGNOSIS — Z9484 Stem cells transplant status: Secondary | ICD-10-CM

## 2018-12-30 DIAGNOSIS — C9001 Multiple myeloma in remission: Secondary | ICD-10-CM

## 2018-12-30 DIAGNOSIS — I4891 Unspecified atrial fibrillation: Secondary | ICD-10-CM | POA: Diagnosis not present

## 2018-12-30 DIAGNOSIS — Z808 Family history of malignant neoplasm of other organs or systems: Secondary | ICD-10-CM

## 2018-12-30 DIAGNOSIS — C9002 Multiple myeloma in relapse: Secondary | ICD-10-CM

## 2018-12-30 DIAGNOSIS — Z7901 Long term (current) use of anticoagulants: Secondary | ICD-10-CM

## 2018-12-30 DIAGNOSIS — C9000 Multiple myeloma not having achieved remission: Secondary | ICD-10-CM

## 2018-12-30 DIAGNOSIS — R197 Diarrhea, unspecified: Secondary | ICD-10-CM | POA: Diagnosis not present

## 2018-12-30 DIAGNOSIS — G629 Polyneuropathy, unspecified: Secondary | ICD-10-CM

## 2018-12-30 DIAGNOSIS — Z5112 Encounter for antineoplastic immunotherapy: Secondary | ICD-10-CM | POA: Diagnosis not present

## 2018-12-30 LAB — COMPREHENSIVE METABOLIC PANEL
ALT: 15 U/L (ref 0–44)
AST: 27 U/L (ref 15–41)
Albumin: 3.8 g/dL (ref 3.5–5.0)
Alkaline Phosphatase: 43 U/L (ref 38–126)
Anion gap: 8 (ref 5–15)
BUN: 18 mg/dL (ref 8–23)
CO2: 23 mmol/L (ref 22–32)
Calcium: 8.7 mg/dL — ABNORMAL LOW (ref 8.9–10.3)
Chloride: 104 mmol/L (ref 98–111)
Creatinine, Ser: 1.36 mg/dL — ABNORMAL HIGH (ref 0.61–1.24)
GFR calc Af Amer: 60 mL/min — ABNORMAL LOW (ref >60)
GFR calc non Af Amer: 52 mL/min — ABNORMAL LOW (ref >60)
Glucose, Bld: 149 mg/dL — ABNORMAL HIGH (ref 70–99)
Potassium: 3.7 mmol/L (ref 3.5–5.1)
Sodium: 135 mmol/L (ref 135–145)
Total Bilirubin: 0.6 mg/dL (ref 0.3–1.2)
Total Protein: 6.1 g/dL — ABNORMAL LOW (ref 6.5–8.1)

## 2018-12-30 LAB — CBC WITH DIFFERENTIAL/PLATELET
Abs Immature Granulocytes: 0.03 10*3/uL (ref 0.00–0.07)
Basophils Absolute: 0 10*3/uL (ref 0.0–0.1)
Basophils Relative: 0 %
Eosinophils Absolute: 0.1 10*3/uL (ref 0.0–0.5)
Eosinophils Relative: 2 %
HCT: 34.4 % — ABNORMAL LOW (ref 39.0–52.0)
Hemoglobin: 11.7 g/dL — ABNORMAL LOW (ref 13.0–17.0)
Immature Granulocytes: 0 %
Lymphocytes Relative: 18 %
Lymphs Abs: 1.4 10*3/uL (ref 0.7–4.0)
MCH: 34.4 pg — ABNORMAL HIGH (ref 26.0–34.0)
MCHC: 34 g/dL (ref 30.0–36.0)
MCV: 101.2 fL — ABNORMAL HIGH (ref 80.0–100.0)
Monocytes Absolute: 0.4 10*3/uL (ref 0.1–1.0)
Monocytes Relative: 5 %
Neutro Abs: 5.8 10*3/uL (ref 1.7–7.7)
Neutrophils Relative %: 75 %
Platelets: 187 10*3/uL (ref 150–400)
RBC: 3.4 MIL/uL — ABNORMAL LOW (ref 4.22–5.81)
RDW: 14.2 % (ref 11.5–15.5)
WBC: 7.7 10*3/uL (ref 4.0–10.5)
nRBC: 0 % (ref 0.0–0.2)

## 2018-12-30 LAB — MAGNESIUM: Magnesium: 1.6 mg/dL — ABNORMAL LOW (ref 1.7–2.4)

## 2018-12-30 MED ORDER — SODIUM CHLORIDE 0.9% FLUSH
10.0000 mL | INTRAVENOUS | Status: DC | PRN
  Administered 2018-12-30: 10 mL via INTRAVENOUS
  Filled 2018-12-30: qty 10

## 2018-12-30 MED ORDER — SODIUM CHLORIDE 0.9 % IV SOLN
Freq: Once | INTRAVENOUS | Status: AC
  Administered 2018-12-30: 11:00:00 via INTRAVENOUS
  Filled 2018-12-30: qty 250

## 2018-12-30 MED ORDER — HEPARIN SOD (PORK) LOCK FLUSH 100 UNIT/ML IV SOLN
500.0000 [IU] | Freq: Once | INTRAVENOUS | Status: AC
  Administered 2018-12-30: 500 [IU] via INTRAVENOUS

## 2018-12-30 MED ORDER — MAGNESIUM SULFATE 2 GM/50ML IV SOLN
INTRAVENOUS | Status: AC
  Filled 2018-12-30: qty 50

## 2018-12-30 MED ORDER — MAGNESIUM SULFATE 2 GM/50ML IV SOLN
2.0000 g | Freq: Once | INTRAVENOUS | Status: AC
  Administered 2018-12-30: 2 g via INTRAVENOUS

## 2018-12-30 MED ORDER — SODIUM CHLORIDE 0.9 % IV SOLN
1200.0000 mg | Freq: Once | INTRAVENOUS | Status: AC
  Administered 2018-12-30: 1200 mg via INTRAVENOUS
  Filled 2018-12-30: qty 60

## 2018-12-30 NOTE — Progress Notes (Signed)
No new changes noted today 

## 2018-12-30 NOTE — Patient Instructions (Signed)
Hypomagnesemia Hypomagnesemia is a condition in which the level of magnesium in the blood is low. Magnesium is a mineral that is found in many foods. It is used in many different processes in the body. Hypomagnesemia can affect every organ in the body. In severe cases, it can cause life-threatening problems. What are the causes? This condition may be caused by:  Not getting enough magnesium in your diet.  Malnutrition.  Problems with absorbing magnesium from the intestines.  Dehydration.  Alcohol abuse.  Vomiting.  Severe or chronic diarrhea.  Some medicines, including medicines that make you urinate more (diuretics).  Certain diseases, such as kidney disease, diabetes, celiac disease, and overactive thyroid. What are the signs or symptoms? Symptoms of this condition include:  Loss of appetite.  Nausea and vomiting.  Involuntary shaking or trembling of a body part (tremor).  Muscle weakness.  Tingling in the arms and legs.  Sudden tightening of muscles (muscle spasms).  Confusion.  Psychiatric issues, such as depression, irritability, or psychosis.  A feeling of fluttering of the heart.  Seizures. These symptoms are more severe if magnesium levels drop suddenly. How is this diagnosed? This condition may be diagnosed based on:  Your symptoms and medical history.  A physical exam.  Blood and urine tests. How is this treated? Treatment depends on the cause and the severity of the condition. It may be treated with:  A magnesium supplement. This can be taken in pill form. If the condition is severe, magnesium is usually given through an IV.  Changes to your diet. You may be directed to eat foods that have a lot of magnesium, such as green leafy vegetables, peas, beans, and nuts.  Stopping any intake of alcohol. Follow these instructions at home:      Make sure that your diet includes foods with magnesium. Foods that have a lot of magnesium in them include:  ? Green leafy vegetables, such as spinach and broccoli. ? Beans and peas. ? Nuts and seeds, such as almonds and sunflower seeds. ? Whole grains, such as whole grain bread and fortified cereals.  Take magnesium supplements if your health care provider tells you to do that. Take them as directed.  Take over-the-counter and prescription medicines only as told by your health care provider.  Have your magnesium levels monitored as told by your health care provider.  When you are active, drink fluids that contain electrolytes.  Avoid drinking alcohol.  Keep all follow-up visits as told by your health care provider. This is important. Contact a health care provider if:  You get worse instead of better.  Your symptoms return. Get help right away if you:  Develop severe muscle weakness.  Have trouble breathing.  Feel that your heart is racing. Summary  Hypomagnesemia is a condition in which the level of magnesium in the blood is low.  Hypomagnesemia can affect every organ in the body.  Treatment may include eating more foods that contain magnesium, taking magnesium supplements, and not drinking alcohol.  Have your magnesium levels monitored as told by your health care provider. This information is not intended to replace advice given to you by your health care provider. Make sure you discuss any questions you have with your health care provider. Document Released: 06/21/2005 Document Revised: 08/27/2017 Document Reviewed: 08/27/2017 Elsevier Interactive Patient Education  2019 [place] injection What is this medicine? DARATUMUMAB (dar a toom ue mab) is a monoclonal antibody. It is used to treat multiple myeloma. This medicine may  be used for other purposes; ask your health care provider or pharmacist if you have questions. COMMON BRAND NAME(S): DARZALEX What should I tell my health care provider before I take this medicine? They need to know if you have any of  these conditions: -infection (especially a virus infection such as chickenpox, herpes, or hepatitis B virus) -lung or breathing disease -an unusual or allergic reaction to daratumumab, other medicines, foods, dyes, or preservatives -pregnant or trying to get pregnant -breast-feeding How should I use this medicine? This medicine is for infusion into a vein. It is given by a health care professional in a hospital or clinic setting. Talk to your pediatrician regarding the use of this medicine in children. Special care may be needed. Overdosage: If you think you have taken too much of this medicine contact a poison control center or emergency room at once. NOTE: This medicine is only for you. Do not share this medicine with others. What if I miss a dose? Keep appointments for follow-up doses as directed. It is important not to miss your dose. Call your doctor or health care professional if you are unable to keep an appointment. What may interact with this medicine? Interactions have not been studied. Give your health care provider a list of all the medicines, herbs, non-prescription drugs, or dietary supplements you use. Also tell them if you smoke, drink alcohol, or use illegal drugs. Some items may interact with your medicine. This list may not describe all possible interactions. Give your health care provider a list of all the medicines, herbs, non-prescription drugs, or dietary supplements you use. Also tell them if you smoke, drink alcohol, or use illegal drugs. Some items may interact with your medicine. What should I watch for while using this medicine? This drug may make you feel generally unwell. Report any side effects. Continue your course of treatment even though you feel ill unless your doctor tells you to stop. This medicine can cause serious allergic reactions. To reduce your risk you may need to take medicine before treatment with this medicine. Take your medicine as directed. This  medicine can affect the results of blood tests to match your blood type. These changes can last for up to 6 months after the final dose. Your healthcare provider will do blood tests to match your blood type before you start treatment. Tell all of your healthcare providers that you are being treated with this medicine before receiving a blood transfusion. This medicine can affect the results of some tests used to determine treatment response; extra tests may be needed to evaluate response. Do not become pregnant while taking this medicine or for 3 months after stopping it. Women should inform their doctor if they wish to become pregnant or think they might be pregnant. There is a potential for serious side effects to an unborn child. Talk to your health care professional or pharmacist for more information. What side effects may I notice from receiving this medicine? Side effects that you should report to your doctor or health care professional as soon as possible: -allergic reactions like skin rash, itching or hives, swelling of the face, lips, or tongue -breathing problems -chills -cough -dizziness -feeling faint or lightheaded -headache -low blood counts - this medicine may decrease the number of white blood cells, red blood cells and platelets. You may be at increased risk for infections and bleeding. -nausea, vomiting -shortness of breath -signs of decreased platelets or bleeding - bruising, pinpoint red spots on the skin, black,  tarry stools, blood in the urine -signs of decreased red blood cells - unusually weak or tired, feeling faint or lightheaded, falls -signs of infection - fever or chills, cough, sore throat, pain or difficulty passing urine -signs and symptoms of liver injury like dark yellow or brown urine; general ill feeling or flu-like symptoms; light-colored stools; loss of appetite; right upper belly pain; unusually weak or tired; yellowing of the eyes or skin Side effects that  usually do not require medical attention (report to your doctor or health care professional if they continue or are bothersome): -back pain -constipation -loss of appetite -diarrhea -joint pain -muscle cramps -pain, tingling, numbness in the hands or feet -swelling of the ankles, feet, hands -tiredness -trouble sleeping This list may not describe all possible side effects. Call your doctor for medical advice about side effects. You may report side effects to FDA at 1-800-FDA-1088. Where should I keep my medicine? Keep out of the reach of children. This drug is given in a hospital or clinic and will not be stored at home. NOTE: This sheet is a summary. It may not cover all possible information. If you have questions about this medicine, talk to your doctor, pharmacist, or health care provider.  2019 [employer]vier/[employer] Standard (2018-04-26 15:52:44)

## 2018-12-31 LAB — PROTEIN ELECTROPHORESIS, SERUM
A/G Ratio: 1.7 (ref 0.7–1.7)
Albumin ELP: 3.4 g/dL (ref 2.9–4.4)
Alpha-1-Globulin: 0.1 g/dL (ref 0.0–0.4)
Alpha-2-Globulin: 0.8 g/dL (ref 0.4–1.0)
Beta Globulin: 0.9 g/dL (ref 0.7–1.3)
Gamma Globulin: 0.2 g/dL — ABNORMAL LOW (ref 0.4–1.8)
Globulin, Total: 2 g/dL — ABNORMAL LOW (ref 2.2–3.9)
M-Spike, %: 0.1 g/dL — ABNORMAL HIGH
Total Protein ELP: 5.4 g/dL — ABNORMAL LOW (ref 6.0–8.5)

## 2019-01-06 ENCOUNTER — Other Ambulatory Visit: Payer: Self-pay | Admitting: Urgent Care

## 2019-01-06 DIAGNOSIS — C9001 Multiple myeloma in remission: Secondary | ICD-10-CM

## 2019-01-08 ENCOUNTER — Other Ambulatory Visit: Payer: Self-pay | Admitting: Urgent Care

## 2019-01-08 DIAGNOSIS — C9001 Multiple myeloma in remission: Secondary | ICD-10-CM

## 2019-01-26 NOTE — Progress Notes (Signed)
[facility] [address], [place], [place] [address] Phone: [phone]  Fax: [phone]     Clinic day:  01/27/19  Chief Complaint: Logan Perez is a 73 y.o. 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  HPI: The patient was last seen in the medical oncology clinic on 12/30/2018.  At that time, he was doing well.  He noted significant neuropathy in his feet.  Patient states, "the sensitivity is just gone".  Neuropathy in his hands wass stable and not affecting function.  No nausea, vomiting, or abdominal pain.  Bowels were well managed with PRN loperamide dosing.  No B symptoms or recent infections.  Exam was grossly unremarkable.  WBC 7700 ([place] 5800).  Hemoglobin 11.7, hematocrit 34.4, MCV 104.2, and platelets 187,000.  Creatinine was 1.36.  Calcium 8.7.  Magnesium 1.6.  SPEP was 0.1 g/dL.  He received cycle #26 daratumumab.  During the interim, he has felt "pretty good".  He denies any cough or shortness of breath.  Appetite has been "fine".  He states that [facility] called him and his stem cells in the freezer were 73 years old (told no longer viable after 10 years).  He has low back pain for which he takes Tylenol prn.  He continues with his pain pump (Fentanyl, bupivacaine, and clonidine).  He states that Dr. Milinda Pointer can not boost up the dose or his legs become numb.  Bowel movements are the same.  He notes the "bowel waltz" every 4 days (repeat pattern).  At most, he will take 6 Imodium/day.  He never has more than 3 lose stools/fay.  Neuropathy is unchanged (feet slightly affected).  He notes standing on a vacuum cords and being unaware of it.  He denies any fever or infections.   Past Medical History:  Diagnosis Date   Anxiety    Atrial fibrillation (HCC)    BPH (benign prostatic  hyperplasia)    Complication of anesthesia    BAD HEADACHE NIGHT OF FIRST CATARACT   Compression fracture of lumbar vertebra (HCC)    Difficulty voiding    Dysrhythmia    A FIB   Elevated PSA    GERD (gastroesophageal reflux disease)    Hearing aid worn    bilateral   History of kidney stones    HLD (hyperlipidemia)    HOH (hard of hearing)    Hypertension    Multiple myeloma (HCC)    Neuropathy    feet. 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   Pain    BACK   Palpitations    Pneumonia    Pulmonary embolism (HCC)    Sepsis (HCC)    Stroke (HCC)    TIA, detected on CT scan. pt was unaware    Past Surgical History:  Procedure Laterality Date   BACK SURGERY  1994   CATARACT EXTRACTION W/PHACO Right 05/02/2016   Procedure: CATARACT EXTRACTION PHACO AND INTRAOCULAR LENS PLACEMENT (IOC);  Surgeon: Birder Robson, MD;  Location: [facility];  Service: Ophthalmology;  Laterality: Right;  [place] 1.06 AP% 20.6 CDE 13.70 FLUID PACK LOT # [id number]   CATARACT EXTRACTION W/PHACO Left 05/16/2016   Procedure: CATARACT EXTRACTION PHACO AND INTRAOCULAR LENS PLACEMENT (IOC);  Surgeon: Birder Robson, MD;  Location: [facility];  Service: Ophthalmology;  Laterality: Left;  [place] 01:43 AP% 19.8 CDE 20.45 FLUID PACK LOT #[id number] H  COLONOSCOPY WITH PROPOFOL N/A 03/22/2017   Procedure: COLONOSCOPY WITH PROPOFOL;  Surgeon: Lucilla Lame, MD;  Location: [facility];  Service: Endoscopy;  Laterality: N/A;  has port   ESOPHAGOGASTRODUODENOSCOPY (EGD) WITH PROPOFOL  03/22/2017   Procedure: ESOPHAGOGASTRODUODENOSCOPY (EGD) WITH PROPOFOL;  Surgeon: Lucilla Lame, MD;  Location: [facility];  Service: Endoscopy;;   EYE SURGERY     KNEE ARTHROSCOPY Left 1992   LIMBAL STEM CELL TRANSPLANT     PAIN PUMP IMPLANTATION  2012   PAIN PUMP IMPLANTATION N/A 09/04/2017   Procedure: INTRATHECAL PUMP BATTERY CHANGE;  Surgeon: Milinda Pointer, MD;  Location: [facility];  Service: Neurosurgery;   Laterality: N/A;   PORTA CATH INSERTION N/A 12/04/2016   Procedure: Glori Luis Cath Insertion;  Surgeon: Algernon Huxley, MD;  Location: [facility];  Service: Cardiovascular;  Laterality: N/A;   stem cell implant  2008   [facility]    Family History  Problem Relation Age of Onset   Cancer Father        throat   Kidney disease Sister    Stroke Other    Stroke Mother    Bladder Cancer Neg Hx    Prostate cancer Neg Hx    Kidney cancer Neg Hx     Social History:  reports that he quit smoking about 43 years ago. His smoking use included cigarettes. He has a 30.00 pack-year smoking history. He has never used smokeless tobacco. He reports that he does not drink alcohol or use drugs.  He has a wire haired dachshund.  He lives in [place].  His wife's name is [name].  The patient is alone today.  Allergies:  Allergies  Allergen Reactions   Azithromycin Diarrhea and Other (See Comments)    Possible cause of C. Diff Possible cause of C. Diff Possible cause of C. Diff Possible cause of C. Diff   Bee Pollen Other (See Comments)    Sneezing, watery eyes, runny nose   Pollen Extract Other (See Comments)    Sneezing, watery eyes, runny nose   Zoledronic Acid Other (See Comments)    ONG- Osteonecrosis of the jaw  Osteonecrosis of the jaw   Rivaroxaban Rash    Current Medications: Current Outpatient Medications  Medication Sig Dispense Refill   acetaminophen (TYLENOL) 500 MG tablet Take 500 mg by mouth 2 (two) times a day.     ALPRAZolam (XANAX) 0.25 MG tablet TAKE 1 TABLET BY MOUTH AT BEDTIME AS NEEDED 30 tablet 0   baclofen (LIORESAL) 10 MG tablet Take 1 tablet (10 mg total) by mouth 2 (two) times daily. 180 tablet 3   Calcium Carb-Cholecalciferol (CALCIUM-VITAMIN D) 500-400 MG-UNIT TABS Take 1 tablet daily by mouth.     cetirizine (ZYRTEC) 10 MG tablet Take 10 mg by mouth daily as needed for allergies.      Cholecalciferol (VITAMIN D3) 2000 units capsule Take 2,000 Units  daily by mouth.      Daratumumab (DARZALEX IV) Inject 1,200 mg every 30 (thirty) days into the vein.      dexamethasone (DECADRON) 4 MG tablet TAKE 4 TABLETS BY MOUTH 1 HOUR PRIOR TO INFUSION, THEN 1 TABLET ON DAYS 2 AND 3 AS DIRECTED 30 tablet 0   diltiazem (CARDIZEM CD) 120 MG 24 hr capsule Take 120 mg daily by mouth.      diltiazem (CARDIZEM) 60 MG tablet Take 60 mg daily as needed by mouth (for increased heart rate > 140).      diphenhydrAMINE (BENADRYL) 25  MG tablet Take 25 mg by mouth as directed. With chemo treatment     ELIQUIS 5 MG TABS tablet TAKE 1 TABLET BY MOUTH TWICE DAILY 60 tablet 0   ENSURE (ENSURE) Take 237 mLs by mouth daily.     fluticasone (FLONASE) 50 MCG/ACT nasal spray Place 1 spray into the nose daily as needed for allergies.      Hypromellose (ARTIFICIAL TEARS OP) Place 1 drop as needed into both eyes (for dry eyes).     loperamide (IMODIUM) 2 MG capsule Take 4 mg as needed by mouth for diarrhea or loose stools.      loratadine (CLARITIN) 10 MG tablet Take 10 mg by mouth daily as needed.      lovastatin (MEVACOR) 20 MG tablet Take 20 mg by mouth every evening.      montelukast (SINGULAIR) 10 MG tablet TAKE 1 TABLET BY MOUTH DAILY THE DAY BEFORE THE INFUSION, THEN DAY OF AND THE 2 DAYS AFTER INFUSION 30 tablet 0   Multiple Vitamins-Iron (MULTIVITAMIN/IRON PO) Take 1 tablet daily by mouth.     NON FORMULARY IT pump  Fentanyl 1,500.0 mcg/ml Bupivicaine 30.0 mg/ml Clonidine 300.0 mcg/ml Rate 502.2 mcg/day     omeprazole (PRILOSEC) 20 MG capsule Take 20 mg by mouth daily.      ondansetron (ZOFRAN) 8 MG tablet Take 1 tablet (8 mg total) by mouth as directed. Take with chemo and can take it as needed 20 tablet 1   PAIN MANAGEMENT IT PUMP REFILL 1 each by Intrathecal route once for 1 dose. Medication: PF Fentanyl 1,500.0 mcg/ml PF Bupivicaine 30.0 mg/ml PF Clonidine 300.0 mcg/ml Total Volume: 40 ml Needed by 12-10-2018 @ 1000 1 each 0   potassium  chloride SA (K-DUR,KLOR-CON) 20 MEQ tablet TAKE 1 TABLET BY MOUTH DAILY 90 tablet 0   tamsulosin (FLOMAX) 0.4 MG CAPS capsule Take 1 capsule (0.4 mg total) by mouth daily. 90 capsule 4   valACYclovir (VALTREX) 500 MG tablet Take 1 tablet (500 mg total) by mouth daily. 30 tablet 3   vitamin B-12 (CYANOCOBALAMIN) 1000 MCG tablet Take 1,000 mcg daily by mouth.      bismuth subsalicylate (PEPTO BISMOL) 262 MG/15ML suspension Take 30 mLs by mouth every 6 (six) hours as needed for diarrhea or loose stools.     budesonide (ENTOCORT EC) 3 MG 24 hr capsule Take 3 capsules (9 mg total) by mouth every morning. (Patient not taking: Reported on 01/27/2019) 90 capsule 6   No current facility-administered medications for this visit.    Facility-Administered Medications Ordered in Other Visits  Medication Dose Route Frequency Provider Last Rate Last Dose   heparin lock flush 100 unit/mL  500 Units Intravenous Once Jeilyn Reznik C, MD       sodium chloride flush (NS) 0.9 % injection 10 mL  10 mL Intravenous PRN Lequita Asal, MD   10 mL at 01/27/19 9381    Review of Systems  Constitutional: Negative.  Negative for chills, diaphoresis, fever, malaise/fatigue and weight loss (up 1 pound).       "Feels "pretty good".  HENT: Negative.  Negative for congestion, ear discharge, ear pain, nosebleeds, sinus pain, sore throat and tinnitus.   Eyes: Negative.  Negative for blurred vision, double vision, photophobia and pain.  Respiratory: Negative.  Negative for cough, hemoptysis, sputum production and shortness of breath.   Cardiovascular: Negative.  Negative for chest pain, palpitations, orthopnea, leg swelling and PND.       H/o atrial fibrillation.  Gastrointestinal: Positive for diarrhea (chronic; managed with PRN loperamide). Negative for abdominal pain, blood in stool, constipation, heartburn, melena, nausea and vomiting.       "Bowel waltz" every 4 days.  Genitourinary: Negative.  Negative for  dysuria, frequency, hematuria and urgency.  Musculoskeletal: Positive for back pain (chronic; Fentanyl/bupivacaine/clonidine pump). Negative for falls, joint pain, myalgias and neck pain.  Skin: Negative.  Negative for itching and rash.  Neurological: Positive for sensory change (neuropathy in toes). Negative for dizziness, tremors, speech change, focal weakness, weakness and headaches.  Endo/Heme/Allergies: Positive for environmental allergies (seasonal allergies). Does not bruise/bleed easily.  Psychiatric/Behavioral: Negative.  Negative for depression and memory loss. The patient is not nervous/anxious and does not have insomnia.   All other systems reviewed and are negative.  Performance status (ECOG): 1  Vital signs BP 137/88 (BP Location: Right Arm, Patient Position: Sitting)    Pulse 81    Temp 98.1 F (36.7 C) (Oral)    Resp 16    Wt 171 lb 3 oz (77.7 kg)    SpO2 98%    BMI 26.81 kg/m   Physical Exam  Constitutional: He is well-developed, well-nourished, and in no distress. No distress.  HENT:  Head: Normocephalic and atraumatic.  Mouth/Throat: Oropharynx is clear and moist and mucous membranes are normal. No oropharyngeal exudate.  Gray hair.  Goatee.  Wearing a mask.  Eyes: Pupils are equal, round, and reactive to light. Conjunctivae and EOM are normal. No scleral icterus.  Glasses.  Blue.  Neck: Normal range of motion. Neck supple. No JVD present.  Cardiovascular: Normal rate, regular rhythm, normal heart sounds and intact distal pulses. Exam reveals no gallop and no friction rub.  No murmur heard. Pulmonary/Chest: Effort normal and breath sounds normal. No respiratory distress. He has no wheezes.  Abdominal: Bowel sounds are normal. He exhibits no distension and no mass. There is no abdominal tenderness. There is no rebound and no guarding.  Musculoskeletal:        General: No tenderness or edema.     Comments: Implanted Fentanyl/bupivacaine/clonidine pump  Lymphadenopathy:     He has no cervical adenopathy.    He has no axillary adenopathy.       Right: No supraclavicular adenopathy present.       Left: No supraclavicular adenopathy present.  Neurological: He is alert. Gait normal.  Skin: Skin is warm and dry. No rash noted. He is not diaphoretic. No erythema. No pallor.  Psychiatric: Mood and judgment normal.  Nursing note and vitals reviewed.    Infusion on 01/27/2019  Component Date Value Ref Range Status   Sodium 01/27/2019 135  135 - 145 mmol/L Final   Potassium 01/27/2019 3.9  3.5 - 5.1 mmol/L Final   Chloride 01/27/2019 105  98 - 111 mmol/L Final   CO2 01/27/2019 22  22 - 32 mmol/L Final   Glucose, Bld 01/27/2019 144* 70 - 99 mg/dL Final   BUN 01/27/2019 20  8 - 23 mg/dL Final   Creatinine, Ser 01/27/2019 1.48* 0.61 - 1.24 mg/dL Final   Calcium 01/27/2019 8.9  8.9 - 10.3 mg/dL Final   Total Protein 01/27/2019 6.4* 6.5 - 8.1 g/dL Final   Albumin 01/27/2019 4.0  3.5 - 5.0 g/dL Final   AST 01/27/2019 19  15 - 41 U/L Final   ALT 01/27/2019 13  0 - 44 U/L Final   Alkaline Phosphatase 01/27/2019 39  38 - 126 U/L Final   Total Bilirubin 01/27/2019 0.6  0.3 -  1.2 mg/dL Final   GFR calc non Af Amer 01/27/2019 47* >60 mL/min Final   GFR calc Af Amer 01/27/2019 54* >60 mL/min Final   Anion gap 01/27/2019 8  5 - 15 Final   Performed at [facility], [address], [place], [place] [address]   WBC 01/27/2019 7.6  4.0 - 10.5 K/uL Final   RBC 01/27/2019 3.53* 4.22 - 5.81 MIL/uL Final   Hemoglobin 01/27/2019 12.3* 13.0 - 17.0 g/dL Final   HCT 01/27/2019 36.1* 39.0 - 52.0 % Final   MCV 01/27/2019 102.3* 80.0 - 100.0 fL Final   MCH 01/27/2019 34.8* 26.0 - 34.0 pg Final   MCHC 01/27/2019 34.1  30.0 - 36.0 g/dL Final   RDW 01/27/2019 14.6  11.5 - 15.5 % Final   Platelets 01/27/2019 204  150 - 400 K/uL Final   nRBC 01/27/2019 0.0  0.0 - 0.2 % Final   Neutrophils Relative % 01/27/2019 81  % Final   Neutro Abs  01/27/2019 6.1  1.7 - 7.7 K/uL Final   Lymphocytes Relative 01/27/2019 14  % Final   Lymphs Abs 01/27/2019 1.1  0.7 - 4.0 K/uL Final   Monocytes Relative 01/27/2019 4  % Final   Monocytes Absolute 01/27/2019 0.3  0.1 - 1.0 K/uL Final   Eosinophils Relative 01/27/2019 1  % Final   Eosinophils Absolute 01/27/2019 0.1  0.0 - 0.5 K/uL Final   Basophils Relative 01/27/2019 0  % Final   Basophils Absolute 01/27/2019 0.0  0.0 - 0.1 K/uL Final   Immature Granulocytes 01/27/2019 0  % Final   Abs Immature Granulocytes 01/27/2019 0.03  0.00 - 0.07 K/uL Final   Performed at [facility], [address], [place], [place] [address]    Assessment:  Logan Perez is a 73 y.o. male with stage III mutiple myeloma.  He initially presented with progressive back pain beginning in 12/2006.  MRI revealed "spots and compression fractures".  He began Velcade, thalidomide, and Decadron.  In 08/2007, he underwent high dose chemotherapy and autologous stem cell transplant.  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  He recurred with a rising M-spike (2.7) with repeat M spike (1.7 gm/dl) in 03/2010.  He was initially treated with Velcade (02/08/2010 - 05/10/2010).  He then began Revlimid (15 mg 3 weeks on/1 week off) and Decadron (40 mg on day 1, 8, 15, 22).  Because of significant side effect with Decadron his dose was decreased to 10 mg once a week in 07/2010.    He was on maintenance Revlimid. Revlimid was initially10 mg 3 weeks on/1 week off. This was changed to 10 mg 2 weeks on/2 weeks off secondary to right nipple tenderness. His dose was increased to 10 mg 3 weeks on/1 week off with Decadron 10 mg a week (on Sundays) and then Revlamid 15 mg 3 weeks on and 1 week off with Decadron on Sundays.  He began Pomalyst 4 mg 3 weeks on/1 week off with Decadron on 08/27/2015.  Over the past year his SPEP has revealed no monoclonal protein (04/21/2015) and 0.5 gm/dL on  09/22/2015 and 10/20/2015. M spike was 0.1 on 02/02/2016, 03/01/2016, 03/29/2016, 04/26/2016, and 05/24/2016.  M spike was 0 on 10/11/2016, 11/28/2016, 02/05/2017, 03/21/2017, 08/02/2017, 10/04/2017, 12/27/2017, 01/24/2018, and 02/21/2018.  M spike was 0.1 on 11/01/2017, 0.1 on 11/29/2017, 0 on 12/27/2017, 0 on 01/24/2018, 0 on 02/21/2018, 0.1 on 03/21/2018, 0.3 on 04/18/2018, 0.1 on 05/16/2018, 0 on 06/13/2018, 0.2 on  08/08/2018, 0 on 09/09/2018, 0.1 on 11/04/2018, 0 on 12/02/2018, and 0.1 on 12/30/2018.  Free light chains have been monitored. Kappa free light chains were 18.54 on 11/21/2013, 18.37 on 02/20/2014, 18.93 on 05/22/2014, 32.58 (high; normal ratio 1.27) on 08/21/2014, 51.53 (high; elevated ratio of 2.12) on 11/13/2014, 28.08 (ratio 1.73) on 12/09/2014, 23.71 (ratio 2.17) on 01/18/2015, 92.93 (ratio 9.49) on 04/21/2015, 93.44 (ratio 10.28) on 05/26/2015, 255.45 (ratio of 24.05) on 07/14/2015, 373.89 (ratio 48.31) on 08/04/2015, 474.33 (ratio 70.58) on 08/25/2015, 450.76 (ratio 55.44) on 09/22/2015, 453.4 (ratio 59.89) on 10/20/2015, 58.26 (ratio of > 40.74) on 02/02/2016, 62.67 (ratio of 37.98) on 03/01/2016, 75.7 (ratio > 50.47) on 03/29/2016, 79.7 (ratio 53.13) on 04/26/2016, 108.6 (ratio > 72.4) on 05/24/2016, 8.3 (1.46 ratio) on 10/11/2016, 6.7 (0.81 ratio) on 02/05/2017, 7.8 (1.01 ratio) on 03/21/2017,7.6 (ratio 0.63) on 06/01/2017, 8.1 (ratio 1.13) on 11/29/2017, 9.6 (ratio 1.55) on 06/13/2018, 6.6 (ratio 2.06) on 07/11/2018, 6.9 (ratio 0.82) on 08/08/2018, 5.8 (ratio 1.66) on 09/09/2018, 5.8 (ratio 1.05) on 11/04/2018, 5.6 (ratio 2.55 on 12/02/2018).  Bone survey on 12/08/2014 was stable.  Bone survey on 10/21/2015 revealed increase conspicuity of subcentimeter lytic lesions in the calvarium.  Bone marrow aspirate and biopsy on 11/04/2015 revealed an atypical monoclonal plasma cells estimated at 30-40% of marrow cells.   Marrow was variably cellular (approximately 45%) with background  trilineage hematopoiesis. There was no significant increase in marrow reticulin fibers. Storage iron was present.    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 He develoed herpes zoster in 04/2008. He developed a pulmonary embolism in 05/2013. He was initially on Xarelto, but is now on Eliquis. He had an episode of pneumonia around this time requiring a brief admission. He developed severe lower leg cramps on 08/07/2014 secondary to hypokalemia. Duplex was negative.   He was treated for C difficile colitis (Flagyl completed 07/30/2015).  He has a chronic indwelling pain pump.  He received 4 cycles of Pomalyst and Decadron (08/27/2015 - 11/19/2015).  Restaging studies document progressive disease.  Kappa free light chains are increasing.  SPEP revealed 0.5 gm/dL monoclonal protein then 1.3 gm/dL.  Bone survey reveals increase conspicuity of subcentimeter lytic lesions in the calvarium.  Bone marrow reveals 30-40% plasma cells.   MUGA on 11/30/2015 revealed an ejection fraction of 46%.  He is felt not to be a good candidate for Kyprolis.  There were no focal wall motion abnormalities. He had a stress echo less than 1 year ago.  He has a history of PVCs and atrial fibrillation.  He takes Cardizem prn.  He received 17 weeks of daratumumab (Darzalex) (12/09/2015 - 05/25/2016).  He tolerated treatment well without side effect.  Bone marrow on 02/09/2016 revealed no diagnostic morphologic evidence of plasma cell myeloma.  Marrow was normocellular to hypocellular marrow for age (ranging from 10-40%) with maturing trilineage hematopoiesis and mild multilineage dyspoiesis.  There was patchy mild increase in reticulin.  Storage iron was present.  Flow cytometry revealed no definitive evidence of monoclonality.  There was a non-specific atypical myeloid and monocytic findings with no increase in blasts.  Cytogenetics were normal (46, XY).  He is  currently 31 months s/p 2nd autologous stem cell transplant on 06/16/2016.  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  He also experienced atrial fibrillation with intermittent episodes of RVR requiring IV beta blockers.  He is on prophylactic valacyclovir for 1 year post transplant.  He  started vaccinations (DTaP-Pediatric triple vaccine, Hep B- Pediatrix triple vaccine, Haemophilus influenza B (Hib), inactivated polio virus (IPV), pneumococcal conjugate vaccine 13-valent (PCV 13)) on 04/17/2017.  Recommendation was for Shingrix vaccine to be given in [place] and second dose of Shingrix in 2 months.  He had follow-up vaccinations on 08/28/2017.  He had his second shingles vaccine. He received 18 month vaccinations - DTaP-Pediatric triple vaccine, Hep B- Pediatrix triple vaccine, Haemophilus influenza B (Hib), inactivated polio virus (IPV), pneumococcal conjugate vaccine 13-valent (PCV 13) on 02/05/2018.  He is s/p 26 cycles of Daratumumab (Darzalex) post transplant (12/05/2016 - 12/30/2018).  PET scan on 08/14/2018 revealed no evidence of active myeloma on whole-body FDG PET scan.  There was no evidence soft tissue plasmacytoma.  There was no clear evidence of lytic lesions on the CT portion exam. Some lucencies in the pelvis were stable. There were chronic compression fractures in the lower thoracic spine.  He has a history of hypomagnesemia that required IV magnesium (2-4 gm) weekly.  He has not required magnesium since 10/24/2016.  Patient has atrial fibrillation and is on Eliquis.  Symptomatically, he is doing well.  He continues to have a 4 day cycle of diarrhea ("Bowel waltz").  Back pain is stable on the pain pump.  Plan:  1.   Labs today:  CBC with diff, CMP, Mg, SPEP, FLCA. 2.   Multiple myeloma Clinically, he continues to do well. M-spike is pending today. He has intermittent blips in monoclonal protein (0.1) every couple of  months. Labs reviewed.  Blood counts stable and adequate for treatment. Cycle #27 daratumumab today.  Patient took premedications at home.  Continue valacyclovir prophylaxis until 3 months s/p completion of daratumumab. Discuss symptom management.  He has antiemetics and pain medications at home to use on a prn bases.  Interventions are adequate.    3.   Macrocytic anemia   Hemoglobin 12.3.  MCV 102.3.   Check B12, folate, and TSH today. 4.   Neuropathy Neuropathy in his feet is stable. Continue to monitor. 5.   Hypomagnesemia   Magnesium is 2.2 today.   Continue to monitor. 6.   Chronic diarrhea Diarrhea well managed. Patient continues to have diarrhea on a 4 day cycle. 7.   RTC in 4 weeks for MD assessment, labs (CBC with diff, CMP, Mg, SPEP), and cycle #28 duratumumab.   I discussed the assessment and treatment plan with the patient.  The patient was provided an opportunity to ask questions and all were answered.  The patient agreed with the plan and demonstrated an understanding of the instructions.  The patient was advised to call back or seek an in person evaluation if the symptoms worsen or if the condition fails to improve as anticipated.   Lequita Asal 01/27/19, 10:04 AM

## 2019-01-27 ENCOUNTER — Inpatient Hospital Stay: Payer: Medicare Other

## 2019-01-27 ENCOUNTER — Inpatient Hospital Stay: Payer: Medicare Other | Attending: Hematology and Oncology | Admitting: Hematology and Oncology

## 2019-01-27 ENCOUNTER — Encounter: Payer: Self-pay | Admitting: Hematology and Oncology

## 2019-01-27 ENCOUNTER — Ambulatory Visit: Payer: Medicare Other

## 2019-01-27 ENCOUNTER — Other Ambulatory Visit: Payer: Self-pay

## 2019-01-27 VITALS — BP 145/83 | HR 70 | Resp 16

## 2019-01-27 VITALS — BP 137/88 | HR 81 | Temp 98.1°F | Resp 16 | Wt 171.2 lb

## 2019-01-27 DIAGNOSIS — D539 Nutritional anemia, unspecified: Secondary | ICD-10-CM | POA: Diagnosis not present

## 2019-01-27 DIAGNOSIS — C9001 Multiple myeloma in remission: Secondary | ICD-10-CM

## 2019-01-27 DIAGNOSIS — Z87891 Personal history of nicotine dependence: Secondary | ICD-10-CM

## 2019-01-27 DIAGNOSIS — G629 Polyneuropathy, unspecified: Secondary | ICD-10-CM

## 2019-01-27 DIAGNOSIS — C9002 Multiple myeloma in relapse: Secondary | ICD-10-CM | POA: Insufficient documentation

## 2019-01-27 DIAGNOSIS — Z5112 Encounter for antineoplastic immunotherapy: Secondary | ICD-10-CM | POA: Diagnosis not present

## 2019-01-27 DIAGNOSIS — R197 Diarrhea, unspecified: Secondary | ICD-10-CM

## 2019-01-27 DIAGNOSIS — Z7901 Long term (current) use of anticoagulants: Secondary | ICD-10-CM

## 2019-01-27 DIAGNOSIS — Z808 Family history of malignant neoplasm of other organs or systems: Secondary | ICD-10-CM

## 2019-01-27 DIAGNOSIS — Z9484 Stem cells transplant status: Secondary | ICD-10-CM

## 2019-01-27 DIAGNOSIS — Z79899 Other long term (current) drug therapy: Secondary | ICD-10-CM | POA: Insufficient documentation

## 2019-01-27 DIAGNOSIS — I4891 Unspecified atrial fibrillation: Secondary | ICD-10-CM

## 2019-01-27 LAB — COMPREHENSIVE METABOLIC PANEL
ALT: 13 U/L (ref 0–44)
AST: 19 U/L (ref 15–41)
Albumin: 4 g/dL (ref 3.5–5.0)
Alkaline Phosphatase: 39 U/L (ref 38–126)
Anion gap: 8 (ref 5–15)
BUN: 20 mg/dL (ref 8–23)
CO2: 22 mmol/L (ref 22–32)
Calcium: 8.9 mg/dL (ref 8.9–10.3)
Chloride: 105 mmol/L (ref 98–111)
Creatinine, Ser: 1.48 mg/dL — ABNORMAL HIGH (ref 0.61–1.24)
GFR calc Af Amer: 54 mL/min — ABNORMAL LOW (ref >60)
GFR calc non Af Amer: 47 mL/min — ABNORMAL LOW (ref >60)
Glucose, Bld: 144 mg/dL — ABNORMAL HIGH (ref 70–99)
Potassium: 3.9 mmol/L (ref 3.5–5.1)
Sodium: 135 mmol/L (ref 135–145)
Total Bilirubin: 0.6 mg/dL (ref 0.3–1.2)
Total Protein: 6.4 g/dL — ABNORMAL LOW (ref 6.5–8.1)

## 2019-01-27 LAB — CBC WITH DIFFERENTIAL/PLATELET
Abs Immature Granulocytes: 0.03 10*3/uL (ref 0.00–0.07)
Basophils Absolute: 0 10*3/uL (ref 0.0–0.1)
Basophils Relative: 0 %
Eosinophils Absolute: 0.1 10*3/uL (ref 0.0–0.5)
Eosinophils Relative: 1 %
HCT: 36.1 % — ABNORMAL LOW (ref 39.0–52.0)
Hemoglobin: 12.3 g/dL — ABNORMAL LOW (ref 13.0–17.0)
Immature Granulocytes: 0 %
Lymphocytes Relative: 14 %
Lymphs Abs: 1.1 10*3/uL (ref 0.7–4.0)
MCH: 34.8 pg — ABNORMAL HIGH (ref 26.0–34.0)
MCHC: 34.1 g/dL (ref 30.0–36.0)
MCV: 102.3 fL — ABNORMAL HIGH (ref 80.0–100.0)
Monocytes Absolute: 0.3 10*3/uL (ref 0.1–1.0)
Monocytes Relative: 4 %
Neutro Abs: 6.1 10*3/uL (ref 1.7–7.7)
Neutrophils Relative %: 81 %
Platelets: 204 10*3/uL (ref 150–400)
RBC: 3.53 MIL/uL — ABNORMAL LOW (ref 4.22–5.81)
RDW: 14.6 % (ref 11.5–15.5)
WBC: 7.6 10*3/uL (ref 4.0–10.5)
nRBC: 0 % (ref 0.0–0.2)

## 2019-01-27 LAB — MAGNESIUM: Magnesium: 2.2 mg/dL (ref 1.7–2.4)

## 2019-01-27 LAB — TSH: TSH: 4.135 u[IU]/mL (ref 0.350–4.500)

## 2019-01-27 LAB — FOLATE: Folate: 40 ng/mL (ref >5.9)

## 2019-01-27 LAB — VITAMIN B12: Vitamin B-12: 762 pg/mL (ref 180–914)

## 2019-01-27 MED ORDER — SODIUM CHLORIDE 0.9 % IV SOLN
1200.0000 mg | Freq: Once | INTRAVENOUS | Status: AC
  Administered 2019-01-27: 11:00:00 1200 mg via INTRAVENOUS
  Filled 2019-01-27: qty 60

## 2019-01-27 MED ORDER — HEPARIN SOD (PORK) LOCK FLUSH 100 UNIT/ML IV SOLN
500.0000 [IU] | Freq: Once | INTRAVENOUS | Status: AC
  Administered 2019-01-27: 500 [IU] via INTRAVENOUS

## 2019-01-27 MED ORDER — SODIUM CHLORIDE 0.9% FLUSH
10.0000 mL | INTRAVENOUS | Status: DC | PRN
  Administered 2019-01-27: 09:00:00 10 mL via INTRAVENOUS
  Filled 2019-01-27: qty 10

## 2019-01-27 MED ORDER — SODIUM CHLORIDE 0.9 % IV SOLN
Freq: Once | INTRAVENOUS | Status: AC
  Administered 2019-01-27: 10:00:00 via INTRAVENOUS
  Filled 2019-01-27: qty 250

## 2019-01-27 NOTE — Progress Notes (Signed)
Pt here for follow up. Denies any concerns at this time.  

## 2019-01-28 LAB — PROTEIN ELECTROPHORESIS, SERUM
A/G Ratio: 1.8 — ABNORMAL HIGH (ref 0.7–1.7)
Albumin ELP: 3.6 g/dL (ref 2.9–4.4)
Alpha-1-Globulin: 0.1 g/dL (ref 0.0–0.4)
Alpha-2-Globulin: 0.8 g/dL (ref 0.4–1.0)
Beta Globulin: 0.7 g/dL (ref 0.7–1.3)
Gamma Globulin: 0.3 g/dL — ABNORMAL LOW (ref 0.4–1.8)
Globulin, Total: 2 g/dL — ABNORMAL LOW (ref 2.2–3.9)
Total Protein ELP: 5.6 g/dL — ABNORMAL LOW (ref 6.0–8.5)

## 2019-01-28 LAB — KAPPA/LAMBDA LIGHT CHAINS
Kappa free light chain: 5.5 mg/L (ref 3.3–19.4)
Kappa, lambda light chain ratio: 1.96 — ABNORMAL HIGH (ref 0.26–1.65)
Lambda free light chains: 2.8 mg/L — ABNORMAL LOW (ref 5.7–26.3)

## 2019-02-06 IMAGING — PT NM PET TUM IMG INITIAL (PI) WHOLE BODY
1 of 9 series · 3 of 25 positions shown · non-contrast
Comparison: None

CLINICAL DATA: Subsequent treatment strategy for multiple myeloma.
History of stem cell transplant 2 years prior. Immunotherapy
infusions monthly..

EXAM:
NUCLEAR MEDICINE PET WHOLE BODY
TECHNIQUE: 9.5 mCi F-18 FDG was injected intravenously. Full-ring PET imaging
was performed from the skull base to thigh after the radiotracer. CT
data was obtained and used for attenuation correction and anatomic
localization.
Fasting blood glucose: 106 mg/dl

[Series 3: ct wb 5.0 b30f · axial · 5.0mm · 0.98mm/px · z∈[-1153,-250]mm · 3 of 603 slices shown]
[im 151/603  soft-tissue]
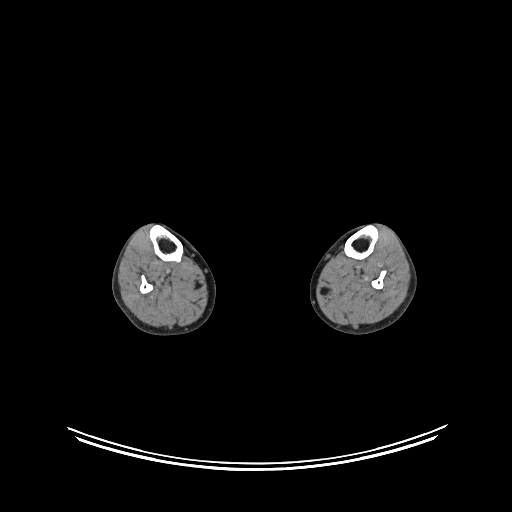
[im 302/603  soft-tissue]
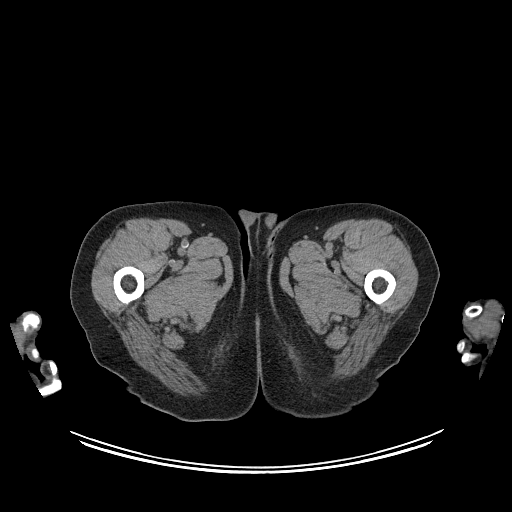
[im 452/603  soft-tissue]
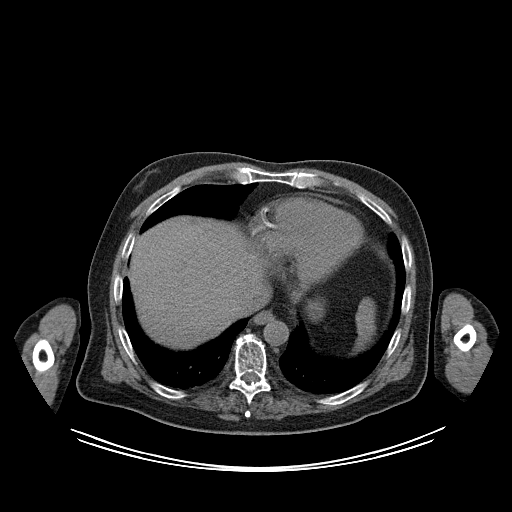

[3 of 25 positions shown; findings below may reference images not displayed]

FINDINGS: Mediastinal blood pool activity: SUV max

HEAD/NECK: No hypermetabolic activity in the scalp. No
hypermetabolic cervical lymph nodes.

Incidental CT findings: none

CHEST: No hypermetabolic mediastinal or hilar nodes. No suspicious
pulmonary nodules on the CT scan.

Incidental CT findings: Port in the anterior chest wall with tip in
distal SVC.

ABDOMEN/PELVIS: No abnormal hypermetabolic activity within the
liver, pancreas, adrenal glands, or spleen. No hypermetabolic lymph
nodes in the abdomen or pelvis.

Incidental CT findings: Atherosclerotic calcification of the aorta..
A spinal cord stimulator generator pack in the anterior abdominal
wall. Lead extends into the central canal of the thoracic spine.

SKELETON: No focal activity within the skeleton suggest active
multiple myeloma. Some lucent lesions in the pelvis. Compression
fractures in the spine (T10) not changed from prior.

Incidental CT findings: none

EXTREMITIES: No evidence of myeloma

Incidental CT findings: No lytic or sclerotic lesions
IMPRESSION: 1. No evidence of active myeloma on whole-body FDG PET scan.
2. No evidence soft tissue plasmacytoma.
3. No clear evidence of lytic lesions on the CT portion exam. Some
lucencies in the pelvis are stable. Chronic compression fractures in
the lower thoracic spine.

## 2019-02-08 ENCOUNTER — Other Ambulatory Visit: Payer: Self-pay | Admitting: Hematology and Oncology

## 2019-02-08 DIAGNOSIS — C9001 Multiple myeloma in remission: Secondary | ICD-10-CM

## 2019-02-09 ENCOUNTER — Other Ambulatory Visit: Payer: Self-pay | Admitting: Hematology and Oncology

## 2019-02-09 DIAGNOSIS — C9001 Multiple myeloma in remission: Secondary | ICD-10-CM

## 2019-02-20 NOTE — Progress Notes (Signed)
[facility]  [address], [address] [place], [place] [address] Phone: [phone]  Fax: [phone]   Clinic Day:  02/24/2019  Referring physician: Sofie Hartigan, MD  Chief Complaint: Logan Perez is a 73 y.o. 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  HPI: The patient was last seen in the medical oncology clinic on 01/27/2019. At that time, he was doing well.  He continued to have a 4 day cycle of diarrhea ("Bowel waltz").  Back pain was stable on the pain pump. He received cycle #27 daratumumab. Folate was 40.0. B12 762. TSH 4.135.  During the interim, he is "feeling great." Diarrhea resolved 1 week ago since he stopped taking a daily energy drink.   His back pain is 2/10 today, improving with Tylenol.  Neuropathy is stable.  Allergies have improved. He has chronic dry eyes, which water often.   He takes a daily Vitamin D, Magnesium, and B12 supplements.  He took his pre-medications at 8:45am today.    Past Medical History:  Diagnosis Date   Anxiety    Atrial fibrillation (HCC)    BPH (benign prostatic hyperplasia)    Complication of anesthesia    BAD HEADACHE NIGHT OF FIRST CATARACT   Compression fracture of lumbar vertebra (HCC)    Difficulty voiding    Dysrhythmia    A FIB   Elevated PSA    GERD (gastroesophageal reflux disease)    Hearing aid worn    bilateral   History of kidney stones    HLD (hyperlipidemia)    HOH (hard of hearing)    Hypertension    Multiple myeloma (HCC)    Neuropathy    feet. 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   Pain    BACK   Palpitations    Pneumonia    Pulmonary embolism (HCC)    Sepsis (HCC)    Stroke (HCC)    TIA, detected on CT scan. pt was unaware    Past Surgical History:  Procedure Laterality Date   BACK SURGERY  1994    CATARACT EXTRACTION W/PHACO Right 05/02/2016   Procedure: CATARACT EXTRACTION PHACO AND INTRAOCULAR LENS PLACEMENT (IOC);  Surgeon: Birder Robson, MD;  Location: [facility];  Service: Ophthalmology;  Laterality: Right;  [place] 1.06 AP% 20.6 CDE 13.70 FLUID PACK LOT # [id number]   CATARACT EXTRACTION W/PHACO Left 05/16/2016   Procedure: CATARACT EXTRACTION PHACO AND INTRAOCULAR LENS PLACEMENT (IOC);  Surgeon: Birder Robson, MD;  Location: [facility];  Service: Ophthalmology;  Laterality: Left;  [place] 01:43 AP% 19.8 CDE 20.45 FLUID PACK LOT #[id number] H   COLONOSCOPY WITH PROPOFOL N/A 03/22/2017   Procedure: COLONOSCOPY WITH PROPOFOL;  Surgeon: Lucilla Lame, MD;  Location: [place];  Service: Endoscopy;  Laterality: N/A;  has port   ESOPHAGOGASTRODUODENOSCOPY (EGD) WITH PROPOFOL  03/22/2017   Procedure: ESOPHAGOGASTRODUODENOSCOPY (EGD) WITH PROPOFOL;  Surgeon: Lucilla Lame, MD;  Location: [place];  Service: Endoscopy;;   EYE SURGERY     KNEE ARTHROSCOPY Left 1992   LIMBAL STEM CELL TRANSPLANT     PAIN PUMP IMPLANTATION  2012   PAIN PUMP IMPLANTATION N/A 09/04/2017   Procedure: INTRATHECAL PUMP BATTERY CHANGE;  Surgeon: Milinda Pointer, MD;  Location: [facility];  Service: Neurosurgery;  Laterality: N/A;   PORTA CATH INSERTION N/A 12/04/2016   Procedure: Glori Luis Cath Insertion;  Surgeon: Algernon Huxley, MD;  Location: [facility];  Service: Cardiovascular;  Laterality: N/A;   stem cell implant  2008   [facility]    Family History  Problem Relation Age of Onset   Cancer Father        throat   Kidney disease Sister    Stroke Other    Stroke Mother    Bladder Cancer Neg Hx    Prostate cancer Neg Hx    Kidney cancer Neg Hx     Social History:  reports that he quit smoking about 43 years ago. His smoking use included cigarettes. He has a 30.00 pack-year smoking history. He has never used smokeless tobacco. He reports that he does not drink alcohol or use drugs. He  has a wire haired dachshund.  He lives in [place].  His wife's name is [name].  The patient is alone today.  Allergies:  Allergies  Allergen Reactions   Azithromycin Diarrhea and Other (See Comments)    Possible cause of C. Diff Possible cause of C. Diff Possible cause of C. Diff Possible cause of C. Diff   Bee Pollen Other (See Comments)    Sneezing, watery eyes, runny nose   Pollen Extract Other (See Comments)    Sneezing, watery eyes, runny nose   Zoledronic Acid Other (See Comments)    ONG- Osteonecrosis of the jaw  Osteonecrosis of the jaw   Rivaroxaban Rash    Current Medications: Current Outpatient Medications  Medication Sig Dispense Refill   acetaminophen (TYLENOL) 500 MG tablet Take 500 mg by mouth 2 (two) times a day.     baclofen (LIORESAL) 10 MG tablet Take 1 tablet (10 mg total) by mouth 2 (two) times daily. 180 tablet 3   bismuth subsalicylate (PEPTO BISMOL) 262 MG/15ML suspension Take 30 mLs by mouth every 6 (six) hours as needed for diarrhea or loose stools.     Calcium Carb-Cholecalciferol (CALCIUM-VITAMIN D) 500-400 MG-UNIT TABS Take 1 tablet daily by mouth.     cetirizine (ZYRTEC) 10 MG tablet Take 10 mg by mouth daily as needed for allergies.      Cholecalciferol (VITAMIN D3) 2000 units capsule Take 2,000 Units daily by mouth.      Daratumumab (DARZALEX IV) Inject 1,200 mg every 30 (thirty) days into the vein.      diltiazem (CARDIZEM CD) 120 MG 24 hr capsule Take 120 mg daily by mouth.      diltiazem (CARDIZEM) 60 MG tablet Take 60 mg daily as needed by mouth (for increased heart rate > 140).      ELIQUIS 5 MG TABS tablet TAKE 1 TABLET BY MOUTH TWICE DAILY 60 tablet 0   Hypromellose (ARTIFICIAL TEARS OP) Place 1 drop as needed into both eyes (for dry eyes).     loratadine (CLARITIN) 10 MG tablet Take 10 mg by mouth daily as needed.      lovastatin (MEVACOR) 20 MG tablet Take 20 mg by mouth every evening.      Multiple Vitamins-Iron  (MULTIVITAMIN/IRON PO) Take 1 tablet daily by mouth.     NON FORMULARY IT pump  Fentanyl 1,500.0 mcg/ml Bupivicaine 30.0 mg/ml Clonidine 300.0 mcg/ml Rate 502.2 mcg/day     omeprazole (PRILOSEC) 20 MG capsule Take 20 mg by mouth daily.      potassium chloride SA (K-DUR,KLOR-CON) 20 MEQ tablet TAKE 1 TABLET BY MOUTH DAILY 90 tablet 0   tamsulosin (FLOMAX) 0.4 MG CAPS capsule Take 1 capsule (0.4 mg total) by mouth daily. 90 capsule 4  valACYclovir (VALTREX) 500 MG tablet Take 1 tablet (500 mg total) by mouth daily. 30 tablet 3   vitamin B-12 (CYANOCOBALAMIN) 1000 MCG tablet Take 1,000 mcg daily by mouth.      ALPRAZolam (XANAX) 0.25 MG tablet TAKE 1 TABLET BY MOUTH AT BEDTIME AS NEEDED (Patient not taking: Reported on 02/24/2019) 30 tablet 1   budesonide (ENTOCORT EC) 3 MG 24 hr capsule Take 3 capsules (9 mg total) by mouth every morning. (Patient not taking: Reported on 01/27/2019) 90 capsule 6   dexamethasone (DECADRON) 4 MG tablet TAKE 4 TABLETS BY MOUTH 1 HOUR PRIOR TO INFUSION, THEN 1 TABLET ON DAYS 2 AND 3 AS DIRECTED (Patient not taking: Reported on 02/24/2019) 30 tablet 0   diphenhydrAMINE (BENADRYL) 25 MG tablet Take 25 mg by mouth as directed. With chemo treatment     ENSURE (ENSURE) Take 237 mLs by mouth daily.     fluticasone (FLONASE) 50 MCG/ACT nasal spray Place 1 spray into the nose daily as needed for allergies.      loperamide (IMODIUM) 2 MG capsule Take 4 mg as needed by mouth for diarrhea or loose stools.      montelukast (SINGULAIR) 10 MG tablet TAKE 1 TABLET BY MOUTH DAILY THE DAY BEFORE THE INFUSION, THEN DAY OF AND THE 2 DAYS AFTER INFUSION (Patient not taking: Reported on 02/24/2019) 30 tablet 0   ondansetron (ZOFRAN) 8 MG tablet Take 1 tablet (8 mg total) by mouth as directed. Take with chemo and can take it as needed (Patient not taking: Reported on 02/24/2019) 20 tablet 1   PAIN MANAGEMENT IT PUMP REFILL 1 each by Intrathecal route once for 1 dose.  Medication: PF Fentanyl 1,500.0 mcg/ml PF Bupivicaine 30.0 mg/ml PF Clonidine 300.0 mcg/ml Total Volume: 40 ml Needed by 12-10-2018 @ 1000 1 each 0   No current facility-administered medications for this visit.     Review of Systems  Constitutional: Positive for weight loss (1lb). Negative for chills, diaphoresis, fever and malaise/fatigue.       "Feeling great."  HENT: Negative.  Negative for congestion, ear discharge, ear pain, nosebleeds, sinus pain, sore throat and tinnitus.   Eyes: Negative.  Negative for blurred vision, double vision, photophobia and pain.  Respiratory: Negative.  Negative for cough, hemoptysis, sputum production and shortness of breath.   Cardiovascular: Negative.  Negative for chest pain, palpitations, orthopnea, leg swelling and PND.       H/o atrial fibrillation.  Gastrointestinal: Positive for diarrhea (chronic, improved). Negative for abdominal pain, blood in stool, constipation, heartburn, melena, nausea and vomiting.       "Bowel waltz" every 4 days, resolved.  Genitourinary: Negative.  Negative for dysuria, frequency, hematuria and urgency.  Musculoskeletal: Positive for back pain (chronic; Fentanyl/bupivacaine/clonidine pump). Negative for falls, joint pain, myalgias and neck pain.  Skin: Negative.  Negative for itching and rash.  Neurological: Positive for sensory change (neuropathy in toes, stable). Negative for dizziness, tremors, speech change, focal weakness, weakness and headaches.  Endo/Heme/Allergies: Positive for environmental allergies (seasonal allergies). Does not bruise/bleed easily.  Psychiatric/Behavioral: Negative.  Negative for depression and memory loss. The patient is not nervous/anxious and does not have insomnia.   All other systems reviewed and are negative.  Performance status (ECOG): 1  Physical Exam  Constitutional: He is oriented to person, place, and time. He appears well-developed and well-nourished. No distress.  HENT:   Head: Normocephalic and atraumatic.  Mouth/Throat: Oropharynx is clear and moist. No oropharyngeal exudate.  Dark hair with graying.  Goatee.  Wearing a mask.   Eyes: Pupils are equal, round, and reactive to light. Conjunctivae and EOM are normal. No scleral icterus.  Glasses. Blue eyes.  Neck: Normal range of motion. Neck supple. No JVD present.  Cardiovascular: Normal rate, regular rhythm and normal heart sounds.  No murmur heard. Pulmonary/Chest: Effort normal and breath sounds normal. No respiratory distress. He has no wheezes. He has no rales.  Abdominal: Soft. Bowel sounds are normal. He exhibits no distension and no mass. There is no abdominal tenderness. There is no rebound and no guarding.  Musculoskeletal: Normal range of motion.        General: Tenderness (bilateral ankles) present. No edema.     Comments: Implanted Fentanyl/bupivacaine/clonidine pump   Lymphadenopathy:    He has no cervical adenopathy.    He has no axillary adenopathy.       Right: No supraclavicular adenopathy present.       Left: No supraclavicular adenopathy present.  Neurological: He is alert and oriented to person, place, and time.  Skin: Skin is warm and dry. No rash noted. He is not diaphoretic. No erythema. No pallor.  Psychiatric: He has a normal mood and affect. His behavior is normal. Judgment and thought content normal.  Nursing note and vitals reviewed.   Appointment on 02/24/2019  Component Date Value Ref Range Status   WBC 02/24/2019 6.3  4.0 - 10.5 K/uL Final   RBC 02/24/2019 3.41* 4.22 - 5.81 MIL/uL Final   Hemoglobin 02/24/2019 12.1* 13.0 - 17.0 g/dL Final   HCT 02/24/2019 35.3* 39.0 - 52.0 % Final   MCV 02/24/2019 103.5* 80.0 - 100.0 fL Final   MCH 02/24/2019 35.5* 26.0 - 34.0 pg Final   MCHC 02/24/2019 34.3  30.0 - 36.0 g/dL Final   RDW 02/24/2019 14.0  11.5 - 15.5 % Final   Platelets 02/24/2019 179  150 - 400 K/uL Final   nRBC 02/24/2019 0.0  0.0 - 0.2 % Final    Neutrophils Relative % 02/24/2019 81  % Final   Neutro Abs 02/24/2019 5.0  1.7 - 7.7 K/uL Final   Lymphocytes Relative 02/24/2019 14  % Final   Lymphs Abs 02/24/2019 0.9  0.7 - 4.0 K/uL Final   Monocytes Relative 02/24/2019 4  % Final   Monocytes Absolute 02/24/2019 0.3  0.1 - 1.0 K/uL Final   Eosinophils Relative 02/24/2019 1  % Final   Eosinophils Absolute 02/24/2019 0.1  0.0 - 0.5 K/uL Final   Basophils Relative 02/24/2019 0  % Final   Basophils Absolute 02/24/2019 0.0  0.0 - 0.1 K/uL Final   Immature Granulocytes 02/24/2019 0  % Final   Abs Immature Granulocytes 02/24/2019 0.02  0.00 - 0.07 K/uL Final   Performed at [facility], [address] [place], [place] [address]    Assessment:  Logan Perez is a 73 y.o. male with stage III mutiple myeloma.  He initially presented with progressive back pain beginning in 12/2006.  MRI revealed "spots and compression fractures".  He began Velcade, thalidomide, and Decadron.  In 08/2007, he underwent high dose chemotherapy and autologous stem cell transplant.  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  He recurred with a rising M-spike (2.7) with repeat M spike (1.7 gm/dl) in 03/2010.  He was initially treated with Velcade (02/08/2010 - 05/10/2010).  He then began Revlimid (15 mg 3 weeks on/1 week off) and Decadron (40 mg on day 1, 8, 15, 22).  Because of significant side  effect with Decadron his dose was decreased to 10 mg once a week in 07/2010.    He was on maintenance Revlimid. Revlimid was initially10 mg 3 weeks on/1 week off. This was changed to 10 mg 2 weeks on/2 weeks off secondary to right nipple tenderness. His dose was increased to 10 mg 3 weeks on/1 week off with Decadron 10 mg a week (on Sundays) and then Revlamid 15 mg 3 weeks on and 1 week off with Decadron on Sundays.  He began Pomalyst 4 mg 3 weeks on/1 week off with Decadron on 08/27/2015.  Over the past year his SPEP has  revealed no monoclonal protein (04/21/2015) and 0.5 gm/dL on 09/22/2015 and 10/20/2015. M spike was 0.1 on 02/02/2016, 03/01/2016, 03/29/2016, 04/26/2016, and 05/24/2016.  M spike was 0 on 10/11/2016, 11/28/2016, 02/05/2017, 03/21/2017, 08/02/2017, 10/04/2017, 12/27/2017, 01/24/2018, and 02/21/2018.  M spike was 0.1 on 11/01/2017, 0.1 on 11/29/2017, 0 on 12/27/2017, 0 on 01/24/2018, 0 on 02/21/2018, 0.1 on 03/21/2018, 0.3 on 04/18/2018, 0.1 on 05/16/2018, 0 on 06/13/2018, 0.2 on 08/08/2018, 0 on 09/09/2018, 0.1 on 11/04/2018, 0 on 12/02/2018, and 0.1 on 12/30/2018.  Free light chains have been monitored. Kappa free light chains were 18.54 on 11/21/2013, 18.37 on 02/20/2014, 18.93 on 05/22/2014, 32.58 (high; normal ratio 1.27) on 08/21/2014, 51.53 (high; elevated ratio of 2.12) on 11/13/2014, 28.08 (ratio 1.73) on 12/09/2014, 23.71 (ratio 2.17) on 01/18/2015, 92.93 (ratio 9.49) on 04/21/2015, 93.44 (ratio 10.28) on 05/26/2015, 255.45 (ratio of 24.05) on 07/14/2015, 373.89 (ratio 48.31) on 08/04/2015, 474.33 (ratio 70.58) on 08/25/2015, 450.76 (ratio 55.44) on 09/22/2015, 453.4 (ratio 59.89) on 10/20/2015, 58.26 (ratio of > 40.74) on 02/02/2016, 62.67 (ratio of 37.98) on 03/01/2016, 75.7 (ratio > 50.47) on 03/29/2016, 79.7 (ratio 53.13) on 04/26/2016, 108.6 (ratio > 72.4) on 05/24/2016, 8.3 (1.46 ratio) on 10/11/2016, 6.7 (0.81 ratio) on 02/05/2017, 7.8 (1.01 ratio) on 03/21/2017,7.6 (ratio 0.63) on 06/01/2017, 8.1 (ratio 1.13) on 11/29/2017, 9.6 (ratio 1.55) on 06/13/2018, 6.6 (ratio 2.06) on 07/11/2018, 6.9 (ratio 0.82) on 08/08/2018, 5.8 (ratio 1.66) on 09/09/2018, 5.8 (ratio 1.05) on 11/04/2018, 5.6 (ratio 2.55 on 12/02/2018).  Bone survey on 12/08/2014 was stable.  Bone survey on 10/21/2015 revealed increase conspicuity of subcentimeter lytic lesions in the calvarium.  Bone marrow aspirate and biopsy on 11/04/2015 revealed an atypical monoclonal plasma cells estimated at 30-40% of marrow cells.    Marrow was variably cellular (approximately 45%) with background trilineage hematopoiesis. There was no significant increase in marrow reticulin fibers. Storage iron was present.    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 He develoed herpes zoster in 04/2008. He developed a pulmonary embolism in 05/2013. He was initially on Xarelto, but is now on Eliquis. He had an episode of pneumonia around this time requiring a brief admission. He developed severe lower leg cramps on 08/07/2014 secondary to hypokalemia. Duplex was negative.   He was treated for C difficile colitis (Flagyl completed 07/30/2015).  He has a chronic indwelling pain pump.  He received 4 cycles of Pomalyst and Decadron (08/27/2015 - 11/19/2015).  Restaging studies document progressive disease.  Kappa free light chains are increasing.  SPEP revealed 0.5 gm/dL monoclonal protein then 1.3 gm/dL.  Bone survey reveals increase conspicuity of subcentimeter lytic lesions in the calvarium.  Bone marrow reveals 30-40% plasma cells.   MUGA on 11/30/2015 revealed an ejection fraction of 46%.  He is felt not to be a good candidate for Kyprolis.  There were no focal wall  motion abnormalities. He had a stress echo less than 1 year ago.  He has a history of PVCs and atrial fibrillation.  He takes Cardizem prn.  He received 17 weeks of daratumumab (Darzalex) (12/09/2015 - 05/25/2016).  He tolerated treatment well without side effect.  Bone marrow on 02/09/2016 revealed no diagnostic morphologic evidence of plasma cell myeloma.  Marrow was normocellular to hypocellular marrow for age (ranging from 10-40%) with maturing trilineage hematopoiesis and mild multilineage dyspoiesis.  There was patchy mild increase in reticulin.  Storage iron was present.  Flow cytometry revealed no definitive evidence of monoclonality.  There was a non-specific atypical myeloid and monocytic findings with no  increase in blasts.  Cytogenetics were normal (46, XY).  He is currently 33 months s/p 2nd autologous stem cell transplant on 06/16/2016.  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  He also experienced atrial fibrillation with intermittent episodes of RVR requiring IV beta blockers.  He is on prophylactic valacyclovir for 1 year post transplant.  He started vaccinations (DTaP-Pediatric triple vaccine, Hep B- Pediatrix triple vaccine, Haemophilus influenza B (Hib), inactivated polio virus (IPV), pneumococcal conjugate vaccine 13-valent (PCV 13)) on 04/17/2017.  Recommendation was for Shingrix vaccine to be given in [place] and second dose of Shingrix in 2 months.  He had follow-up vaccinations on 08/28/2017.  He had his second shingles vaccine. He received 18 month vaccinations - DTaP-Pediatric triple vaccine, Hep B- Pediatrix triple vaccine, Haemophilus influenza B (Hib), inactivated polio virus (IPV), pneumococcal conjugate vaccine 13-valent (PCV 13) on 02/05/2018.  He is s/p 27 cycles of Daratumumab (Darzalex) post transplant (12/05/2016 - 01/27/2019).  PET scan on 08/14/2018 revealed no evidence of active myeloma on whole-body FDG PET scan.  There was no evidence soft tissue plasmacytoma.  There was no clear evidence of lytic lesions on the CT portion exam. Some lucencies in the pelvis were stable. There were chronic compression fractures in the lower thoracic spine.  He has a history of hypomagnesemia that required IV magnesium (2-4 gm) weekly.  He has not required magnesium since 10/24/2016.  Patient has atrial fibrillation and is on Eliquis.  Symptomatically, he is doing well.  Diarrhea appears to have resolved after discontinuation of his energy drink.  Exam is stable.  Plan: 1.   Labs today:  CBC with diff, CMP, Mg, SPEP. 2.   Multiple myeloma Clinically, he continues to do extremely well. M-spike is 0.1 today. He continues to  have intermittent blips in monoclonal protein (0.1) every couple of months. Labs reviewed.  Blood counts stable and adequate for treatment. Cycle #28 daratumumab today.       Patient took his premedications at home (confirmed).       Plan to continue valacyclovir prophylaxis until 3 months s/p completion of daratumumab. Patient has no planned follow-up with transplant center.  Contact Dr. Adriana Simas. Discuss symptom management.  He has antiemetics and pain medications at home to use on a prn bases.  Interventions are adequate.    3.   Macrocytic anemia              Hemoglobin 12.1.  MCV 103.5.              B12, folate, and TSH were normal on 01/27/2019. 4.   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 Continue to monitor. 5.   Hypomagnesemia              Magnesium is 2.0 today.  Continue to monitor. 6.   Chronic diarrhea Appears to have resolved after discontinuation of energy drink. Continue to monitor. 7.   RTC in 4 weeks for MD assessment, labs (CBC with diff, CMP, Mg, SPEP), and cycle #29 daratumumab.   I discussed the assessment and treatment plan with the patient.  The patient was provided an opportunity to ask questions and all were answered.  The patient agreed with the plan and demonstrated an understanding of the instructions.  The patient was advised to call back if the symptoms worsen or if the condition fails to improve as anticipated.   Lequita Asal, MD, PhD    02/24/2019, 9:45 AM  I, Molly Dorshimer, am acting as Education administrator for [employer]. Mike Gip, MD, PhD.  I, Helio Lack C. Mike Gip, MD, have reviewed the above documentation for accuracy and completeness, and I agree with the above.

## 2019-02-24 ENCOUNTER — Other Ambulatory Visit: Payer: Self-pay

## 2019-02-24 ENCOUNTER — Inpatient Hospital Stay (HOSPITAL_BASED_OUTPATIENT_CLINIC_OR_DEPARTMENT_OTHER): Payer: Medicare Other | Admitting: Hematology and Oncology

## 2019-02-24 ENCOUNTER — Inpatient Hospital Stay: Payer: Medicare Other | Attending: Hematology and Oncology

## 2019-02-24 ENCOUNTER — Encounter: Payer: Self-pay | Admitting: Hematology and Oncology

## 2019-02-24 ENCOUNTER — Inpatient Hospital Stay: Payer: Medicare Other

## 2019-02-24 VITALS — BP 106/71 | HR 80 | Temp 97.9°F | Resp 18 | Ht 67.0 in | Wt 170.4 lb

## 2019-02-24 DIAGNOSIS — G620 Drug-induced polyneuropathy: Secondary | ICD-10-CM

## 2019-02-24 DIAGNOSIS — C9002 Multiple myeloma in relapse: Secondary | ICD-10-CM

## 2019-02-24 DIAGNOSIS — D539 Nutritional anemia, unspecified: Secondary | ICD-10-CM

## 2019-02-24 DIAGNOSIS — I4891 Unspecified atrial fibrillation: Secondary | ICD-10-CM

## 2019-02-24 DIAGNOSIS — C9000 Multiple myeloma not having achieved remission: Secondary | ICD-10-CM

## 2019-02-24 DIAGNOSIS — Z5112 Encounter for antineoplastic immunotherapy: Secondary | ICD-10-CM

## 2019-02-24 DIAGNOSIS — Z87891 Personal history of nicotine dependence: Secondary | ICD-10-CM

## 2019-02-24 DIAGNOSIS — Z7901 Long term (current) use of anticoagulants: Secondary | ICD-10-CM

## 2019-02-24 DIAGNOSIS — C9001 Multiple myeloma in remission: Secondary | ICD-10-CM

## 2019-02-24 DIAGNOSIS — Z9484 Stem cells transplant status: Secondary | ICD-10-CM

## 2019-02-24 LAB — CBC WITH DIFFERENTIAL/PLATELET
Abs Immature Granulocytes: 0.02 10*3/uL (ref 0.00–0.07)
Basophils Absolute: 0 10*3/uL (ref 0.0–0.1)
Basophils Relative: 0 %
Eosinophils Absolute: 0.1 10*3/uL (ref 0.0–0.5)
Eosinophils Relative: 1 %
HCT: 35.3 % — ABNORMAL LOW (ref 39.0–52.0)
Hemoglobin: 12.1 g/dL — ABNORMAL LOW (ref 13.0–17.0)
Immature Granulocytes: 0 %
Lymphocytes Relative: 14 %
Lymphs Abs: 0.9 10*3/uL (ref 0.7–4.0)
MCH: 35.5 pg — ABNORMAL HIGH (ref 26.0–34.0)
MCHC: 34.3 g/dL (ref 30.0–36.0)
MCV: 103.5 fL — ABNORMAL HIGH (ref 80.0–100.0)
Monocytes Absolute: 0.3 10*3/uL (ref 0.1–1.0)
Monocytes Relative: 4 %
Neutro Abs: 5 10*3/uL (ref 1.7–7.7)
Neutrophils Relative %: 81 %
Platelets: 179 10*3/uL (ref 150–400)
RBC: 3.41 MIL/uL — ABNORMAL LOW (ref 4.22–5.81)
RDW: 14 % (ref 11.5–15.5)
WBC: 6.3 10*3/uL (ref 4.0–10.5)
nRBC: 0 % (ref 0.0–0.2)

## 2019-02-24 LAB — COMPREHENSIVE METABOLIC PANEL
ALT: 15 U/L (ref 0–44)
AST: 26 U/L (ref 15–41)
Albumin: 4 g/dL (ref 3.5–5.0)
Alkaline Phosphatase: 37 U/L — ABNORMAL LOW (ref 38–126)
Anion gap: 8 (ref 5–15)
BUN: 17 mg/dL (ref 8–23)
CO2: 23 mmol/L (ref 22–32)
Calcium: 8.8 mg/dL — ABNORMAL LOW (ref 8.9–10.3)
Chloride: 104 mmol/L (ref 98–111)
Creatinine, Ser: 1.5 mg/dL — ABNORMAL HIGH (ref 0.61–1.24)
GFR calc Af Amer: 53 mL/min — ABNORMAL LOW (ref >60)
GFR calc non Af Amer: 46 mL/min — ABNORMAL LOW (ref >60)
Glucose, Bld: 154 mg/dL — ABNORMAL HIGH (ref 70–99)
Potassium: 3.6 mmol/L (ref 3.5–5.1)
Sodium: 135 mmol/L (ref 135–145)
Total Bilirubin: 1 mg/dL (ref 0.3–1.2)
Total Protein: 6.2 g/dL — ABNORMAL LOW (ref 6.5–8.1)

## 2019-02-24 LAB — MAGNESIUM: Magnesium: 2 mg/dL (ref 1.7–2.4)

## 2019-02-24 MED ORDER — SODIUM CHLORIDE 0.9 % IV SOLN
Freq: Once | INTRAVENOUS | Status: AC
  Administered 2019-02-24: 10:00:00 via INTRAVENOUS
  Filled 2019-02-24: qty 250

## 2019-02-24 MED ORDER — SODIUM CHLORIDE 0.9 % IV SOLN
1200.0000 mg | Freq: Once | INTRAVENOUS | Status: AC
  Administered 2019-02-24: 1200 mg via INTRAVENOUS
  Filled 2019-02-24 (×2): qty 60

## 2019-02-24 MED ORDER — ACETAMINOPHEN 325 MG PO TABS: 650.0000 mg | ORAL_TABLET | Freq: Once | ORAL | Status: DC

## 2019-02-24 MED ORDER — ONDANSETRON 8 MG PO TBDP: 8.0000 mg | ORAL_TABLET | Freq: Once | ORAL | Status: DC

## 2019-02-24 MED ORDER — SODIUM CHLORIDE 0.9% FLUSH
10.0000 mL | INTRAVENOUS | Status: DC | PRN
  Administered 2019-02-24: 13:00:00 10 mL
  Filled 2019-02-24: qty 10

## 2019-02-24 MED ORDER — HEPARIN SOD (PORK) LOCK FLUSH 100 UNIT/ML IV SOLN
500.0000 [IU] | Freq: Once | INTRAVENOUS | Status: AC | PRN
  Administered 2019-02-24: 500 [IU]

## 2019-02-24 MED ORDER — DIPHENHYDRAMINE HCL 25 MG PO CAPS: 50.0000 mg | ORAL_CAPSULE | Freq: Once | ORAL | Status: DC

## 2019-02-24 NOTE — Patient Instructions (Signed)
Daratumumab injection What is this medicine? DARATUMUMAB (dar a toom ue mab) is a monoclonal antibody. It is used to treat multiple myeloma. This medicine may be used for other purposes; ask your health care provider or pharmacist if you have questions. COMMON BRAND NAME(S): DARZALEX What should I tell my health care provider before I take this medicine? They need to know if you have any of these conditions: -infection (especially a virus infection such as chickenpox, herpes, or hepatitis B virus) -lung or breathing disease -an unusual or allergic reaction to daratumumab, other medicines, foods, dyes, or preservatives -pregnant or trying to get pregnant -breast-feeding How should I use this medicine? This medicine is for infusion into a vein. It is given by a health care professional in a hospital or clinic setting. Talk to your pediatrician regarding the use of this medicine in children. Special care may be needed. Overdosage: If you think you have taken too much of this medicine contact a poison control center or emergency room at once. NOTE: This medicine is only for you. Do not share this medicine with others. What if I miss a dose? Keep appointments for follow-up doses as directed. It is important not to miss your dose. Call your doctor or health care professional if you are unable to keep an appointment. What may interact with this medicine? Interactions have not been studied. Give your health care provider a list of all the medicines, herbs, non-prescription drugs, or dietary supplements you use. Also tell them if you smoke, drink alcohol, or use illegal drugs. Some items may interact with your medicine. This list may not describe all possible interactions. Give your health care provider a list of all the medicines, herbs, non-prescription drugs, or dietary supplements you use. Also tell them if you smoke, drink alcohol, or use illegal drugs. Some items may interact with your  medicine. What should I watch for while using this medicine? This drug may make you feel generally unwell. Report any side effects. Continue your course of treatment even though you feel ill unless your doctor tells you to stop. This medicine can cause serious allergic reactions. To reduce your risk you may need to take medicine before treatment with this medicine. Take your medicine as directed. This medicine can affect the results of blood tests to match your blood type. These changes can last for up to 6 months after the final dose. Your healthcare provider will do blood tests to match your blood type before you start treatment. Tell all of your healthcare providers that you are being treated with this medicine before receiving a blood transfusion. This medicine can affect the results of some tests used to determine treatment response; extra tests may be needed to evaluate response. Do not become pregnant while taking this medicine or for 3 months after stopping it. Women should inform their doctor if they wish to become pregnant or think they might be pregnant. There is a potential for serious side effects to an unborn child. Talk to your health care professional or pharmacist for more information. What side effects may I notice from receiving this medicine? Side effects that you should report to your doctor or health care professional as soon as possible: -allergic reactions like skin rash, itching or hives, swelling of the face, lips, or tongue -breathing problems -chills -cough -dizziness -feeling faint or lightheaded -headache -low blood counts - this medicine may decrease the number of white blood cells, red blood cells and platelets. You may be at increased   risk for infections and bleeding. -nausea, vomiting -shortness of breath -signs of decreased platelets or bleeding - bruising, pinpoint red spots on the skin, black, tarry stools, blood in the urine -signs of decreased red blood  cells - unusually weak or tired, feeling faint or lightheaded, falls -signs of infection - fever or chills, cough, sore throat, pain or difficulty passing urine -signs and symptoms of liver injury like dark yellow or brown urine; general ill feeling or flu-like symptoms; light-colored stools; loss of appetite; right upper belly pain; unusually weak or tired; yellowing of the eyes or skin Side effects that usually do not require medical attention (report to your doctor or health care professional if they continue or are bothersome): -back pain -constipation -loss of appetite -diarrhea -joint pain -muscle cramps -pain, tingling, numbness in the hands or feet -swelling of the ankles, feet, hands -tiredness -trouble sleeping This list may not describe all possible side effects. Call your doctor for medical advice about side effects. You may report side effects to FDA at 1-800-FDA-1088. Where should I keep my medicine? Keep out of the reach of children. This drug is given in a hospital or clinic and will not be stored at home. NOTE: This sheet is a summary. It may not cover all possible information. If you have questions about this medicine, talk to your doctor, pharmacist, or health care provider.  2019 [employer] (2018-04-26 15:52:44)

## 2019-02-24 NOTE — Progress Notes (Signed)
No new changes noted today 

## 2019-02-25 LAB — PROTEIN ELECTROPHORESIS, SERUM
A/G Ratio: 1.8 — ABNORMAL HIGH (ref 0.7–1.7)
Albumin ELP: 3.8 g/dL (ref 2.9–4.4)
Alpha-1-Globulin: 0.1 g/dL (ref 0.0–0.4)
Alpha-2-Globulin: 0.8 g/dL (ref 0.4–1.0)
Beta Globulin: 0.9 g/dL (ref 0.7–1.3)
Gamma Globulin: 0.3 g/dL — ABNORMAL LOW (ref 0.4–1.8)
Globulin, Total: 2.1 g/dL — ABNORMAL LOW (ref 2.2–3.9)
M-Spike, %: 0.1 g/dL — ABNORMAL HIGH
Total Protein ELP: 5.9 g/dL — ABNORMAL LOW (ref 6.0–8.5)

## 2019-02-26 ENCOUNTER — Other Ambulatory Visit: Payer: Self-pay

## 2019-02-26 MED ORDER — PAIN MANAGEMENT IT PUMP REFILL: 1.0000 | Freq: Once | INTRATHECAL | 0 refills | Status: DC

## 2019-03-09 ENCOUNTER — Other Ambulatory Visit: Payer: Self-pay | Admitting: Hematology and Oncology

## 2019-03-09 DIAGNOSIS — C9001 Multiple myeloma in remission: Secondary | ICD-10-CM

## 2019-03-18 ENCOUNTER — Other Ambulatory Visit: Payer: Self-pay

## 2019-03-20 NOTE — Progress Notes (Signed)
[facility]  [address] [place], [place] [address] Phone: [phone]  Fax: [phone]   Clinic Day:  03/24/2019  Referring physician: Sofie Hartigan, MD  Chief Complaint: Logan Perez is a 73 y.o. 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  HPI: The patient was last seen in the medical oncology clinic on 02/24/2019. At that time, he was doing well.  Diarrhea appeared to have resolved after discontinuation of his energy drink.  Exam was stable. He received cycle #28 daratumumab.  During the interim, he has felt "fine".  He notes that he has a handle on his diarrhea.  He may have 1 bad event per week; he takes 2 Imodium a day and an additional Imodium if the diarrhea is problematic.  He has no issues with constipation.  He is lactose intolerant.  He takes Lactaid if he eats ice cream.  He denies any bone pain.  He has had no interval infections.  He had COVOD-19 testing (negative) for planned pain pump refill.  He notes a scissoring gait for which he requests a PT consult.   Past Medical History:  Diagnosis Date   Anxiety    Atrial fibrillation (HCC)    BPH (benign prostatic hyperplasia)    Complication of anesthesia    BAD HEADACHE NIGHT OF FIRST CATARACT   Compression fracture of lumbar vertebra (HCC)    Difficulty voiding    Dysrhythmia    A FIB   Elevated PSA    GERD (gastroesophageal reflux disease)    Hearing aid worn    bilateral   History of kidney stones    HLD (hyperlipidemia)    HOH (hard of hearing)    Hypertension    Multiple myeloma (HCC)    Neuropathy    feet. 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   Pain    BACK   Palpitations    Pneumonia    Pulmonary embolism (HCC)    Sepsis (HCC)    Stroke (HCC)    TIA, detected on CT scan. pt was unaware    Past  Surgical History:  Procedure Laterality Date   BACK SURGERY  1994   CATARACT EXTRACTION W/PHACO Right 05/02/2016   Procedure: CATARACT EXTRACTION PHACO AND INTRAOCULAR LENS PLACEMENT (IOC);  Surgeon: WBirder Robson MD;  Location: [facility];  Service: Ophthalmology;  Laterality: Right;  [place] AP% 20.6 CDE 13.70 FLUID PACK LOT # [id number]   CATARACT EXTRACTION W/PHACO Left 05/16/2016   Procedure: CATARACT EXTRACTION PHACO AND INTRAOCULAR LENS PLACEMENT (IOC);  Surgeon: WBirder Robson MD;  Location: [facility];  Service: Ophthalmology;  Laterality: Left;  [place]01:43 AP% 19.8 CDE 20.45 FLUID PACK LOT ##[id number]   COLONOSCOPY WITH PROPOFOL N/A 03/22/2017   Procedure: COLONOSCOPY WITH PROPOFOL;  Surgeon: WLucilla Lame MD;  [place] [facility]  Service: Endoscopy;  Laterality: N/A;  has port   ESOPHAGOGASTRODUODENOSCOPY (EGD) WITH PROPOFOL  03/22/2017   Procedure: ESOPHAGOGASTRODUODENOSCOPY (EGD) WITH PROPOFOL;  Surgeon: WLucilla Lame MD;  Location[place] [facility]  Service: Endoscopy;;   EYE SURGERY     KNEE ARTHROSCOPY Left 1992   LIMBAL STEM CELL TRANSPLANT     PAIN PUMP IMPLANTATION  2012   PAIN PUMP IMPLANTATION N/A 09/04/2017   Procedure: INTRATHECAL PUMP BATTERY CHANGE;  Surgeon: NMilinda Pointer MD;  Location: [facility];  Service: Neurosurgery;  Laterality: N/A;  PORTA CATH INSERTION N/A 12/04/2016   Procedure: Glori Luis Cath Insertion;  Surgeon: Algernon Huxley, MD;  Location: [facility];  Service: Cardiovascular;  Laterality: N/A;   stem cell implant  2008   [facility]    Family History  Problem Relation Age of Onset   Cancer Father        throat   Kidney disease Sister    Stroke Other    Stroke Mother    Bladder Cancer Neg Hx    Prostate cancer Neg Hx    Kidney cancer Neg Hx     Social History:  reports that he quit smoking about 43 years ago. His smoking use included cigarettes. He has a 30.00 pack-year smoking history. He has never used  smokeless tobacco. He reports that he does not drink alcohol or use drugs. He has a wire haired dachshund. He lives in [place]. His wife's name is [name]. The patient is alone today.  Allergies:  Allergies  Allergen Reactions   Azithromycin Diarrhea and Other (See Comments)    Possible cause of C. Diff Possible cause of C. Diff Possible cause of C. Diff Possible cause of C. Diff   Bee Pollen Other (See Comments)    Sneezing, watery eyes, runny nose   Pollen Extract Other (See Comments)    Sneezing, watery eyes, runny nose   Zoledronic Acid Other (See Comments)    ONG- Osteonecrosis of the jaw  Osteonecrosis of the jaw   Rivaroxaban Rash    Current Medications: Current Outpatient Medications  Medication Sig Dispense Refill   acetaminophen (TYLENOL) 500 MG tablet Take 500 mg by mouth 2 (two) times a day.     ALPRAZolam (XANAX) 0.25 MG tablet TAKE 1 TABLET BY MOUTH AT BEDTIME AS NEEDED 30 tablet 1   baclofen (LIORESAL) 10 MG tablet Take 1 tablet (10 mg total) by mouth 2 (two) times daily. 180 tablet 3   bismuth subsalicylate (PEPTO BISMOL) 262 MG/15ML suspension Take 30 mLs by mouth every 6 (six) hours as needed for diarrhea or loose stools.     Calcium Carb-Cholecalciferol (CALCIUM-VITAMIN D) 500-400 MG-UNIT TABS Take 1 tablet daily by mouth.     cetirizine (ZYRTEC) 10 MG tablet Take 10 mg by mouth daily as needed for allergies.      Cholecalciferol (VITAMIN D3) 2000 units capsule Take 2,000 Units daily by mouth.      Daratumumab (DARZALEX IV) Inject 1,200 mg every 30 (thirty) days into the vein.      dexamethasone (DECADRON) 4 MG tablet TAKE 4 TABLETS BY MOUTH 1 HOUR PRIOR TO INFUSION, THEN 1 TABLET ON DAYS 2 AND 3 AS DIRECTED 30 tablet 0   diltiazem (CARDIZEM CD) 120 MG 24 hr capsule Take 120 mg daily by mouth.      diltiazem (CARDIZEM) 60 MG tablet Take 60 mg daily as needed by mouth (for increased heart rate > 140).      diphenhydrAMINE (BENADRYL) 25 MG tablet  Take 25 mg by mouth as directed. With chemo treatment     ELIQUIS 5 MG TABS tablet TAKE 1 TABLET BY MOUTH TWICE DAILY 60 tablet 0   fluticasone (FLONASE) 50 MCG/ACT nasal spray Place 1 spray into the nose daily as needed for allergies.      Hypromellose (ARTIFICIAL TEARS OP) Place 1 drop as needed into both eyes (for dry eyes).     loperamide (IMODIUM) 2 MG capsule Take 4 mg as needed by mouth for diarrhea or loose stools.  loratadine (CLARITIN) 10 MG tablet Take 10 mg by mouth daily as needed.      lovastatin (MEVACOR) 20 MG tablet Take 20 mg by mouth every evening.      montelukast (SINGULAIR) 10 MG tablet TAKE 1 TABLET BY MOUTH DAILY THE DAY BEFORE THE INFUSION, THEN DAY OF AND THE 2 DAYS AFTER INFUSION 30 tablet 0   Multiple Vitamins-Iron (MULTIVITAMIN/IRON PO) Take 1 tablet daily by mouth.     NON FORMULARY IT pump  Fentanyl 1,500.0 mcg/ml Bupivicaine 30.0 mg/ml Clonidine 300.0 mcg/ml Rate 502.2 mcg/day     omeprazole (PRILOSEC) 20 MG capsule Take 20 mg by mouth daily.      ondansetron (ZOFRAN) 8 MG tablet Take 1 tablet (8 mg total) by mouth as directed. Take with chemo and can take it as needed 20 tablet 1   potassium chloride SA (K-DUR,KLOR-CON) 20 MEQ tablet TAKE 1 TABLET BY MOUTH DAILY 90 tablet 0   tamsulosin (FLOMAX) 0.4 MG CAPS capsule Take 1 capsule (0.4 mg total) by mouth daily. 90 capsule 4   valACYclovir (VALTREX) 500 MG tablet Take 1 tablet (500 mg total) by mouth daily. 30 tablet 3   vitamin B-12 (CYANOCOBALAMIN) 1000 MCG tablet Take 1,000 mcg daily by mouth.      PAIN MANAGEMENT IT PUMP REFILL 1 each by Intrathecal route once for 1 dose. Medication: PF Fentanyl 1,500.0 mcg/ml PF Bupivicaine 30.0 mg/ml PF Clonidine 300.0 mcg/ml Total Volume: 40 ml Needed by 12-10-2018 @ 1000 1 each 0   PAIN MANAGEMENT IT PUMP REFILL 1 each by Intrathecal route once for 1 dose. Medication: PF Fentanyl 1,500.0 mcg/ml PF Bupivicaine 30.0 mg/ml PF Clonidine  300.mcg/ml Total Volume: 76m Needed by 03/25/2019 _0  1 each 0   No current facility-administered medications for this visit.    Facility-Administered Medications Ordered in Other Visits  Medication Dose Route Frequency Provider Last Rate Last Dose   heparin lock flush 100 unit/mL  500 Units Intravenous Once Dareion Kneece C, MD       sodium chloride flush (NS) 0.9 % injection 10 mL  10 mL Intravenous PRN CLequita Asal MD   10 mL at 03/24/19 0925    Review of Systems  Constitutional: Positive for weight loss (1 lb). Negative for chills, diaphoresis, fever and malaise/fatigue.       Feels "fine".  HENT: Negative.  Negative for congestion, ear discharge, ear pain, nosebleeds, sinus pain, sore throat and tinnitus.   Eyes: Negative.  Negative for blurred vision, double vision, photophobia and pain.  Respiratory: Negative.  Negative for cough, hemoptysis, sputum production, shortness of breath and wheezing.   Cardiovascular: Negative.  Negative for chest pain, palpitations, orthopnea, leg swelling and PND.       H/o atrial fibrillation.  Gastrointestinal: Positive for diarrhea (chronic). Negative for abdominal pain, blood in stool, constipation, heartburn, melena, nausea and vomiting.       Diarrhea, controlled with Imodium.  Genitourinary: Negative.  Negative for dysuria, frequency, hematuria and urgency.  Musculoskeletal: Positive for back pain (chronic; Fentanyl/bupivacaine/clonidine pump). Negative for falls, joint pain, myalgias and neck pain.  Skin: Negative.  Negative for itching and rash.  Neurological: Positive for sensory change (neuropathy in toes). Negative for dizziness, tremors, speech change, focal weakness, weakness and headaches.  Endo/Heme/Allergies: Positive for environmental allergies (seasonal allergies). Does not bruise/bleed easily.  Psychiatric/Behavioral: Negative.  Negative for depression and memory loss. The patient is not nervous/anxious and does not  have insomnia.   All other systems reviewed and are  negative.  Performance status (ECOG): 1  Physical Exam  Constitutional: He is oriented to person, place, and time. He appears well-developed and well-nourished. No distress.  HENT:  Head: Normocephalic and atraumatic.  Mouth/Throat: Oropharynx is clear and moist. No oropharyngeal exudate.  Dark hair with graying. Goatee.  Wearing a mask.   Eyes: Pupils are equal, round, and reactive to light. EOM are normal. No scleral icterus.  Glasses. Blue eyes.  Neck: Normal range of motion. Neck supple. No JVD present.  Cardiovascular: Normal rate, regular rhythm and normal heart sounds. Exam reveals no gallop.  No murmur heard. Pulmonary/Chest: Effort normal and breath sounds normal. No respiratory distress. He has no wheezes. He has no rales.  Abdominal: Soft. Bowel sounds are normal. He exhibits no distension and no mass. There is no abdominal tenderness. There is no rebound and no guarding.  Musculoskeletal: Normal range of motion.        General: No tenderness or edema.     Comments: Right sided implanted Fentanyl/bupivacaine/clonidine pump.   Lymphadenopathy:    He has no cervical adenopathy.    He has no axillary adenopathy.       Right: No supraclavicular adenopathy present.       Left: No supraclavicular adenopathy present.  Neurological: He is alert and oriented to person, place, and time.  Gait observed with right foot turning in.  Skin: Skin is warm and dry. No rash noted. He is not diaphoretic. No erythema. No pallor.  Psychiatric: He has a normal mood and affect. His behavior is normal. Judgment and thought content normal.  Nursing note and vitals reviewed.   Infusion on 03/24/2019  Component Date Value Ref Range Status   Magnesium 03/24/2019 2.0  1.7 - 2.4 mg/dL Final   Performed at [facility], [address], [place], [place] [address]   Sodium 03/24/2019 137  135 - 145 mmol/L Final   Potassium 03/24/2019  4.0  3.5 - 5.1 mmol/L Final   Chloride 03/24/2019 105  98 - 111 mmol/L Final   CO2 03/24/2019 22  22 - 32 mmol/L Final   Glucose, Bld 03/24/2019 128* 70 - 99 mg/dL Final   BUN 03/24/2019 16  8 - 23 mg/dL Final   Creatinine, Ser 03/24/2019 1.39* 0.61 - 1.24 mg/dL Final   Calcium 03/24/2019 8.6* 8.9 - 10.3 mg/dL Final   Total Protein 03/24/2019 6.3* 6.5 - 8.1 g/dL Final   Albumin 03/24/2019 4.0  3.5 - 5.0 g/dL Final   AST 03/24/2019 19  15 - 41 U/L Final   ALT 03/24/2019 14  0 - 44 U/L Final   Alkaline Phosphatase 03/24/2019 41  38 - 126 U/L Final   Total Bilirubin 03/24/2019 0.7  0.3 - 1.2 mg/dL Final   GFR calc non Af Amer 03/24/2019 50* >60 mL/min Final   GFR calc Af Amer 03/24/2019 58* >60 mL/min Final   Anion gap 03/24/2019 10  5 - 15 Final   Performed at [facility], [address], [place], [place] [address]   WBC 03/24/2019 9.1  4.0 - 10.5 K/uL Final   RBC 03/24/2019 3.54* 4.22 - 5.81 MIL/uL Final   Hemoglobin 03/24/2019 12.5* 13.0 - 17.0 g/dL Final   HCT 03/24/2019 36.6* 39.0 - 52.0 % Final   MCV 03/24/2019 103.4* 80.0 - 100.0 fL Final   MCH 03/24/2019 35.3* 26.0 - 34.0 pg Final   MCHC 03/24/2019 34.2  30.0 - 36.0 g/dL Final   RDW 03/24/2019 13.8  11.5 -  15.5 % Final   Platelets 03/24/2019 192  150 - 400 K/uL Final   nRBC 03/24/2019 0.0  0.0 - 0.2 % Final   Neutrophils Relative % 03/24/2019 76  % Final   Neutro Abs 03/24/2019 6.8  1.7 - 7.7 K/uL Final   Lymphocytes Relative 03/24/2019 17  % Final   Lymphs Abs 03/24/2019 1.5  0.7 - 4.0 K/uL Final   Monocytes Relative 03/24/2019 6  % Final   Monocytes Absolute 03/24/2019 0.6  0.1 - 1.0 K/uL Final   Eosinophils Relative 03/24/2019 1  % Final   Eosinophils Absolute 03/24/2019 0.1  0.0 - 0.5 K/uL Final   Basophils Relative 03/24/2019 0  % Final   Basophils Absolute 03/24/2019 0.0  0.0 - 0.1 K/uL Final   Immature Granulocytes 03/24/2019 0  % Final   Abs Immature Granulocytes  03/24/2019 0.03  0.00 - 0.07 K/uL Final   Performed at [facility], [address] [place], [place] [address]    Assessment:  Dujuan Stankowski is a 73 y.o. male with stage III mutiple myeloma. He initially presented with progressive back pain beginning in 12/2006. MRI revealed "spots and compression fractures". He began Velcade, thalidomide, and Decadron. In 08/2007, he underwent high dose chemotherapy and autologous stem cell transplant. 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  He recurred with a rising M-spike (2.7) with repeat M spike (1.7 gm/dl) in 03/2010. He was initially treated with Velcade (02/08/2010 - 05/10/2010). He then began Revlimid (15 mg 3 weeks on/1 week off) and Decadron (40 mg on day 1, 8, 15, 22). Because of significant side effect with Decadron his dose was decreased to 10 mg once a week in 07/2010.   He was on maintenance Revlimid. Revlimid was initially10 mg 3 weeks on/1 week off. This was changed to 10 mg 2 weeks on/2 weeks off secondary to right nipple tenderness. His dose was increased to 10 mg 3 weeks on/1 week off with Decadron 10 mg a week (on Sundays) and then Revlamid 15 mg 3 weeks on and 1 week off with Decadron on Sundays. He began Pomalyst4 mg 3 weeks on/1 week off with Decadron on 08/27/2015.  Over the past year his SPEPhas revealed no monoclonal protein (04/21/2015) and 0.5 gm/dL on 09/22/2015 and 10/20/2015. M spikewas 0.1 on 02/02/2016, 03/01/2016, 03/29/2016, 04/26/2016, and 05/24/2016. M spikewas 0 on 10/11/2016, 11/28/2016, 02/05/2017, 03/21/2017, 08/02/2017, 10/04/2017, 12/27/2017, 01/24/2018, and 02/21/2018. M spikewas 0.1 on 11/01/2017, 0.1 on 11/29/2017, 0 on 12/27/2017, 0 on 01/24/2018, 0 on 02/21/2018, 0.1 on 03/21/2018, 0.3 on 04/18/2018, 0.1 on 05/16/2018, 0 on 06/13/2018, 0.2 on 08/08/2018, 0 on 09/09/2018, 0.1 on 11/04/2018, 0 on 12/02/2018, 0.1 on 12/30/2018, 0 on 01/27/2019, 0.1 on  02/24/2019, and 0.1 on 03/24/2019.   Free light chainshave been monitored. Kappa free light chainswere 18.54 on 11/21/2013, 18.37 on 02/20/2014, 18.93 on 05/22/2014, 32.58 (high; normal ratio 1.27) on 08/21/2014, 51.53 (high; elevated ratio of 2.12) on 11/13/2014, 28.08 (ratio 1.73) on 12/09/2014, 23.71 (ratio 2.17) on 01/18/2015, 92.93 (ratio 9.49) on 04/21/2015, 93.44 (ratio 10.28) on 05/26/2015, 255.45 (ratio of 24.05) on 07/14/2015, 373.89 (ratio 48.31) on 08/04/2015, 474.33 (ratio 70.58) on 08/25/2015, 450.76 (ratio 55.44) on 09/22/2015, 453.4 (ratio 59.89) on 10/20/2015, 58.26 (ratio of >40.74) on 02/02/2016, 62.67 (ratio of 37.98) on 03/01/2016, 75.7 (ratio >50.47) on 03/29/2016, 79.7 (ratio 53.13) on 04/26/2016, 108.6 (ratio >72.4) on 05/24/2016, 8.3 (1.46 ratio) on 10/11/2016, 6.7 (0.81 ratio) on 02/05/2017, 7.8 (1.01 ratio) on 03/21/2017,7.6 (ratio 0.63) on  06/01/2017, 8.1 (ratio 1.13) on 11/29/2017, 9.6 (ratio 1.55) on 06/13/2018, 6.6 (ratio 2.06) on 07/11/2018, 6.9 (ratio 0.82) on 08/08/2018, 5.8 (ratio 1.66) on 09/09/2018, 5.8 (ratio 1.05) on 11/04/2018, 5.6 (ratio 2.55) on 12/02/2018, 5.5 (ratio 1.96) on 01/27/2019.  Bone surveyon 12/08/2014 was stable. Bone survey on 10/21/2015 revealed increase conspicuity of subcentimeter lytic lesions in the calvarium.  Bone marrow aspirate and biopsyon 11/04/2015 revealed an atypical monoclonal plasma cells estimated at 30-40% of marrow cells. Marrow was variably cellular (approximately 45%) with background trilineage hematopoiesis. There was no significant increase in marrow reticulin fibers. Storage iron was present.   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 He develoed herpes zoster in 04/2008. He developed a pulmonary embolismin 05/2013. He was initially on Xarelto, but is now on Eliquis. He had an episode of pneumonia around this time requiring a brief admission. He  developed severe lower leg cramps on 08/07/2014 secondary to hypokalemia. Duplex was negative.   He was treated forC difficile colitis(Flagyl completed 07/30/2015). He has a chronic indwelling pain pump.  He received 4 cycles of Pomalyst and Decadron(08/27/2015 - 11/19/2015). Restaging studies document progressive disease. Kappa free light chains are increasing. SPEP revealed 0.5 gm/dL monoclonal protein then 1.3 gm/dL. Bone survey reveals increase conspicuity of subcentimeter lytic lesions in the calvarium. Bone marrow reveals 30-40% plasma cells.   MUGAon 11/30/2015 revealed an ejection fraction of 46%. He is felt not to be a good candidate for Kyprolis. There were no focal wall motion abnormalities. He had a stress echo less than 1 year ago. He has a history of PVCs and atrial fibrillation. He takes Cardizem prn.  He received 17 weeks of daratumumab(Darzalex) (12/09/2015 - 05/25/2016). He tolerated treatment well without side effect.  Bone marrow on 02/09/2016 revealed no diagnostic morphologic evidence of plasma cell myeloma. Marrow was normocellular to hypocellular marrow for age (ranging from 10-40%) with maturing trilineage hematopoiesis and mild multilineage dyspoiesis. There was patchy mild increase in reticulin. Storage iron was present. Flow cytometry revealed no definitive evidence of monoclonality. There was a non-specific atypical myeloid and monocytic findings with no increase in blasts. Cytogenetics were normal (46, XY).  He is currently34 months s/p 2nd autologous stem cell transplanton 06/16/2016. 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 He also experienced atrial fibrillation with intermittent episodes of RVR requiring IV beta blockers. He is on prophylactic valacyclovir for 1 year post transplant.  He started vaccinations(DTaP-Pediatric triple vaccine, Hep B- Pediatrix triple vaccine,  Haemophilus influenza B (Hib), inactivated polio virus (IPV), pneumococcal conjugate vaccine 13-valent (PCV 13)) on 04/17/2017. Recommendation was for Shingrix vaccine to be given in [place] and second dose of Shingrix in 2 months. He had follow-up vaccinations on 08/28/2017. He had his second shingles vaccine. He received 18 month vaccinations- DTaP-Pediatric triple vaccine, Hep B- Pediatrix triple vaccine, Haemophilus influenza B (Hib), inactivated polio virus (IPV), pneumococcal conjugate vaccine 13-valent (PCV 13) on 02/05/2018.  He is s/p 28cycles of daratumumab(Darzalex) post transplant(12/05/2016 - 02/24/2019).  PET scanon 08/14/2018 revealed no evidence of active myeloma on whole-body FDG PET scan. There was no evidence soft tissue plasmacytoma. There was no clear evidence of lytic lesions on the CT portion exam. Some lucencies in the pelvis were stable. There were chronic compression fractures in the lower thoracic spine.  He has a history of hypomagnesemiathat required IV magnesium (2-4 gm) weekly. He has not required magnesium since 10/24/2016. Patient has atrial fibrillationand is on Eliquis.  Symptomatically, he is doing well.  He denies any bone pain.  He has had no interval infections.  Diarrhea is well controlled.  Exam is unremarkable.  Plan: 1.   Labs today: CBC with diff, CMP, Mg, SPEP. 2.Multiple myeloma Clinically, he is doing well. M-spike remains stable at 0.1. Labs reviewed.  Blood counts stable and adequate for treatment. Cycle #29 daratumumab today. Patient took his premedications prior to clinic visit (confirmed). Continue valacyclovir prophylaxis until 3 months status post completion of daratumumab. Contact Dr. Adriana Simas at [facility] re: follow-up. Discuss symptom management.  He has antiemetics and pain medications at home to use on a prn bases.  Interventions are adequate.    3. Macrocytic anemia Hemoglobin 12.5.  MCV 103.4. B12, folate, and TSH were normal on 01/27/2019.  Continue to monitor. 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 Continue to monitor. 5.Hypomagnesemia Magnesium 2.0 today. Continue to monitor. 6.Chronic diarrhea Patient's diarrhea is well managed.   Diarrhea appears to continue despite discontinuation of energy drink noted at last visit. Continue to monitor. 7. Scissoring gait  Patient notes abnormal gait and would like an evaluation.  Physical therapy consult. 8.   RTC in 4 weeks for MD assessment, labs (CBC with diff, CMP, Mg, SPEP) and cycle #30 daratumumab.  I discussed the assessment and treatment plan with the patient.  The patient was provided an opportunity to ask questions and all were answered.  The patient agreed with the plan and demonstrated an understanding of the instructions.  The patient was advised to call back if the symptoms worsen or if the condition fails to improve as anticipated.   Lequita Asal, MD, PhD    03/24/2019, 10:37 AM

## 2019-03-21 ENCOUNTER — Other Ambulatory Visit
Admission: RE | Admit: 2019-03-21 | Discharge: 2019-03-21 | Disposition: A | Discharge status: Home / Self Care | Payer: Medicare Other | Source: Ambulatory Visit | Attending: Pain Medicine | Admitting: Pain Medicine

## 2019-03-21 ENCOUNTER — Other Ambulatory Visit: Payer: Self-pay

## 2019-03-21 DIAGNOSIS — Z1159 Encounter for screening for other viral diseases: Secondary | ICD-10-CM | POA: Diagnosis not present

## 2019-03-21 DIAGNOSIS — Z01812 Encounter for preprocedural laboratory examination: Secondary | ICD-10-CM | POA: Diagnosis present

## 2019-03-22 LAB — NOVEL CORONAVIRUS, NAA (HOSP ORDER, SEND-OUT TO REF LAB; TAT 18-24 HRS): SARS-CoV-2, NAA: NOT DETECTED

## 2019-03-24 ENCOUNTER — Encounter: Payer: Self-pay | Admitting: Hematology and Oncology

## 2019-03-24 ENCOUNTER — Inpatient Hospital Stay: Payer: Medicare Other | Attending: Hematology and Oncology | Admitting: Hematology and Oncology

## 2019-03-24 ENCOUNTER — Inpatient Hospital Stay: Payer: Medicare Other

## 2019-03-24 ENCOUNTER — Other Ambulatory Visit: Payer: Self-pay

## 2019-03-24 VITALS — BP 141/84 | HR 88 | Temp 97.7°F | Resp 16 | Wt 169.6 lb

## 2019-03-24 DIAGNOSIS — R2689 Other abnormalities of gait and mobility: Secondary | ICD-10-CM

## 2019-03-24 DIAGNOSIS — C9000 Multiple myeloma not having achieved remission: Secondary | ICD-10-CM

## 2019-03-24 DIAGNOSIS — C9002 Multiple myeloma in relapse: Secondary | ICD-10-CM | POA: Insufficient documentation

## 2019-03-24 DIAGNOSIS — Z5112 Encounter for antineoplastic immunotherapy: Secondary | ICD-10-CM | POA: Diagnosis not present

## 2019-03-24 DIAGNOSIS — Z79899 Other long term (current) drug therapy: Secondary | ICD-10-CM | POA: Insufficient documentation

## 2019-03-24 DIAGNOSIS — D539 Nutritional anemia, unspecified: Secondary | ICD-10-CM

## 2019-03-24 DIAGNOSIS — R197 Diarrhea, unspecified: Secondary | ICD-10-CM

## 2019-03-24 DIAGNOSIS — G629 Polyneuropathy, unspecified: Secondary | ICD-10-CM

## 2019-03-24 DIAGNOSIS — D84822 Immunodeficiency due to external causes: Secondary | ICD-10-CM

## 2019-03-24 DIAGNOSIS — Z9484 Stem cells transplant status: Secondary | ICD-10-CM

## 2019-03-24 DIAGNOSIS — C9001 Multiple myeloma in remission: Secondary | ICD-10-CM | POA: Diagnosis not present

## 2019-03-24 DIAGNOSIS — R269 Unspecified abnormalities of gait and mobility: Secondary | ICD-10-CM

## 2019-03-24 LAB — COMPREHENSIVE METABOLIC PANEL
ALT: 14 U/L (ref 0–44)
AST: 19 U/L (ref 15–41)
Albumin: 4 g/dL (ref 3.5–5.0)
Alkaline Phosphatase: 41 U/L (ref 38–126)
Anion gap: 10 (ref 5–15)
BUN: 16 mg/dL (ref 8–23)
CO2: 22 mmol/L (ref 22–32)
Calcium: 8.6 mg/dL — ABNORMAL LOW (ref 8.9–10.3)
Chloride: 105 mmol/L (ref 98–111)
Creatinine, Ser: 1.39 mg/dL — ABNORMAL HIGH (ref 0.61–1.24)
GFR calc Af Amer: 58 mL/min — ABNORMAL LOW (ref >60)
GFR calc non Af Amer: 50 mL/min — ABNORMAL LOW (ref >60)
Glucose, Bld: 128 mg/dL — ABNORMAL HIGH (ref 70–99)
Potassium: 4 mmol/L (ref 3.5–5.1)
Sodium: 137 mmol/L (ref 135–145)
Total Bilirubin: 0.7 mg/dL (ref 0.3–1.2)
Total Protein: 6.3 g/dL — ABNORMAL LOW (ref 6.5–8.1)

## 2019-03-24 LAB — CBC WITH DIFFERENTIAL/PLATELET
Abs Immature Granulocytes: 0.03 10*3/uL (ref 0.00–0.07)
Basophils Absolute: 0 10*3/uL (ref 0.0–0.1)
Basophils Relative: 0 %
Eosinophils Absolute: 0.1 10*3/uL (ref 0.0–0.5)
Eosinophils Relative: 1 %
HCT: 36.6 % — ABNORMAL LOW (ref 39.0–52.0)
Hemoglobin: 12.5 g/dL — ABNORMAL LOW (ref 13.0–17.0)
Immature Granulocytes: 0 %
Lymphocytes Relative: 17 %
Lymphs Abs: 1.5 10*3/uL (ref 0.7–4.0)
MCH: 35.3 pg — ABNORMAL HIGH (ref 26.0–34.0)
MCHC: 34.2 g/dL (ref 30.0–36.0)
MCV: 103.4 fL — ABNORMAL HIGH (ref 80.0–100.0)
Monocytes Absolute: 0.6 10*3/uL (ref 0.1–1.0)
Monocytes Relative: 6 %
Neutro Abs: 6.8 10*3/uL (ref 1.7–7.7)
Neutrophils Relative %: 76 %
Platelets: 192 10*3/uL (ref 150–400)
RBC: 3.54 MIL/uL — ABNORMAL LOW (ref 4.22–5.81)
RDW: 13.8 % (ref 11.5–15.5)
WBC: 9.1 10*3/uL (ref 4.0–10.5)
nRBC: 0 % (ref 0.0–0.2)

## 2019-03-24 LAB — MAGNESIUM: Magnesium: 2 mg/dL (ref 1.7–2.4)

## 2019-03-24 MED ORDER — HEPARIN SOD (PORK) LOCK FLUSH 100 UNIT/ML IV SOLN
500.0000 [IU] | Freq: Once | INTRAVENOUS | Status: AC
  Administered 2019-03-24: 500 [IU] via INTRAVENOUS

## 2019-03-24 MED ORDER — SODIUM CHLORIDE 0.9 % IV SOLN
Freq: Once | INTRAVENOUS | Status: AC
  Administered 2019-03-24: 11:00:00 via INTRAVENOUS
  Filled 2019-03-24: qty 250

## 2019-03-24 MED ORDER — SODIUM CHLORIDE 0.9% FLUSH
10.0000 mL | INTRAVENOUS | Status: DC | PRN
  Administered 2019-03-24: 10 mL via INTRAVENOUS
  Filled 2019-03-24: qty 10

## 2019-03-24 MED ORDER — SODIUM CHLORIDE 0.9 % IV SOLN
1200.0000 mg | Freq: Once | INTRAVENOUS | Status: AC
  Administered 2019-03-24: 1200 mg via INTRAVENOUS
  Filled 2019-03-24: qty 60

## 2019-03-24 NOTE — Progress Notes (Signed)
Pt here for follow up. Denies any concerns.  

## 2019-03-24 NOTE — Patient Instructions (Signed)
Daratumumab injection What is this medicine? DARATUMUMAB (dar a toom ue mab) is a monoclonal antibody. It is used to treat multiple myeloma. This medicine may be used for other purposes; ask your health care provider or pharmacist if you have questions. COMMON BRAND NAME(S): DARZALEX What should I tell my health care provider before I take this medicine? They need to know if you have any of these conditions: -infection (especially a virus infection such as chickenpox, herpes, or hepatitis B virus) -lung or breathing disease -an unusual or allergic reaction to daratumumab, other medicines, foods, dyes, or preservatives -pregnant or trying to get pregnant -breast-feeding How should I use this medicine? This medicine is for infusion into a vein. It is given by a health care professional in a hospital or clinic setting. Talk to your pediatrician regarding the use of this medicine in children. Special care may be needed. Overdosage: If you think you have taken too much of this medicine contact a poison control center or emergency room at once. NOTE: This medicine is only for you. Do not share this medicine with others. What if I miss a dose? Keep appointments for follow-up doses as directed. It is important not to miss your dose. Call your doctor or health care professional if you are unable to keep an appointment. What may interact with this medicine? Interactions have not been studied. Give your health care provider a list of all the medicines, herbs, non-prescription drugs, or dietary supplements you use. Also tell them if you smoke, drink alcohol, or use illegal drugs. Some items may interact with your medicine. This list may not describe all possible interactions. Give your health care provider a list of all the medicines, herbs, non-prescription drugs, or dietary supplements you use. Also tell them if you smoke, drink alcohol, or use illegal drugs. Some items may interact with your  medicine. What should I watch for while using this medicine? This drug may make you feel generally unwell. Report any side effects. Continue your course of treatment even though you feel ill unless your doctor tells you to stop. This medicine can cause serious allergic reactions. To reduce your risk you may need to take medicine before treatment with this medicine. Take your medicine as directed. This medicine can affect the results of blood tests to match your blood type. These changes can last for up to 6 months after the final dose. Your healthcare provider will do blood tests to match your blood type before you start treatment. Tell all of your healthcare providers that you are being treated with this medicine before receiving a blood transfusion. This medicine can affect the results of some tests used to determine treatment response; extra tests may be needed to evaluate response. Do not become pregnant while taking this medicine or for 3 months after stopping it. Women should inform their doctor if they wish to become pregnant or think they might be pregnant. There is a potential for serious side effects to an unborn child. Talk to your health care professional or pharmacist for more information. What side effects may I notice from receiving this medicine? Side effects that you should report to your doctor or health care professional as soon as possible: -allergic reactions like skin rash, itching or hives, swelling of the face, lips, or tongue -breathing problems -chills -cough -dizziness -feeling faint or lightheaded -headache -low blood counts - this medicine may decrease the number of white blood cells, red blood cells and platelets. You may be at increased   risk for infections and bleeding. -nausea, vomiting -shortness of breath -signs of decreased platelets or bleeding - bruising, pinpoint red spots on the skin, black, tarry stools, blood in the urine -signs of decreased red blood  cells - unusually weak or tired, feeling faint or lightheaded, falls -signs of infection - fever or chills, cough, sore throat, pain or difficulty passing urine -signs and symptoms of liver injury like dark yellow or brown urine; general ill feeling or flu-like symptoms; light-colored stools; loss of appetite; right upper belly pain; unusually weak or tired; yellowing of the eyes or skin Side effects that usually do not require medical attention (report to your doctor or health care professional if they continue or are bothersome): -back pain -constipation -loss of appetite -diarrhea -joint pain -muscle cramps -pain, tingling, numbness in the hands or feet -swelling of the ankles, feet, hands -tiredness -trouble sleeping This list may not describe all possible side effects. Call your doctor for medical advice about side effects. You may report side effects to FDA at 1-800-FDA-1088. Where should I keep my medicine? Keep out of the reach of children. This drug is given in a hospital or clinic and will not be stored at home. NOTE: This sheet is a summary. It may not cover all possible information. If you have questions about this medicine, talk to your doctor, pharmacist, or health care provider.  2019 [employer] (2018-04-26 15:52:44)

## 2019-03-25 ENCOUNTER — Encounter: Payer: Self-pay | Admitting: Pain Medicine

## 2019-03-25 ENCOUNTER — Ambulatory Visit: Payer: Medicare Other | Attending: Pain Medicine | Admitting: Pain Medicine

## 2019-03-25 VITALS — BP 130/82 | HR 95 | Temp 97.9°F | Resp 16 | Ht 67.0 in | Wt 169.0 lb

## 2019-03-25 DIAGNOSIS — S22080S Wedge compression fracture of T11-T12 vertebra, sequela: Secondary | ICD-10-CM | POA: Diagnosis present

## 2019-03-25 DIAGNOSIS — M545 Low back pain: Secondary | ICD-10-CM | POA: Diagnosis present

## 2019-03-25 DIAGNOSIS — G894 Chronic pain syndrome: Secondary | ICD-10-CM

## 2019-03-25 DIAGNOSIS — Z451 Encounter for adjustment and management of infusion pump: Secondary | ICD-10-CM | POA: Insufficient documentation

## 2019-03-25 DIAGNOSIS — Z95828 Presence of other vascular implants and grafts: Secondary | ICD-10-CM | POA: Insufficient documentation

## 2019-03-25 DIAGNOSIS — G8929 Other chronic pain: Secondary | ICD-10-CM | POA: Insufficient documentation

## 2019-03-25 DIAGNOSIS — G893 Neoplasm related pain (acute) (chronic): Secondary | ICD-10-CM | POA: Diagnosis present

## 2019-03-25 DIAGNOSIS — Z79891 Long term (current) use of opiate analgesic: Secondary | ICD-10-CM | POA: Diagnosis present

## 2019-03-25 DIAGNOSIS — M5416 Radiculopathy, lumbar region: Secondary | ICD-10-CM | POA: Insufficient documentation

## 2019-03-25 LAB — PROTEIN ELECTROPHORESIS, SERUM
A/G Ratio: 1.6 (ref 0.7–1.7)
Albumin ELP: 3.6 g/dL (ref 2.9–4.4)
Alpha-1-Globulin: 0.1 g/dL (ref 0.0–0.4)
Alpha-2-Globulin: 0.9 g/dL (ref 0.4–1.0)
Beta Globulin: 0.9 g/dL (ref 0.7–1.3)
Gamma Globulin: 0.4 g/dL (ref 0.4–1.8)
Globulin, Total: 2.3 g/dL (ref 2.2–3.9)
M-Spike, %: 0.1 g/dL — ABNORMAL HIGH
Total Protein ELP: 5.9 g/dL — ABNORMAL LOW (ref 6.0–8.5)

## 2019-03-25 MED FILL — Medication: INTRATHECAL | Qty: 1 | Status: AC

## 2019-03-25 NOTE — Patient Instructions (Signed)
Opioid Overdose Opioids are substances that relieve pain by binding to pain receptors in your brain and spinal cord. Opioids include illegal drugs, such as heroin, as well as prescription pain medicines.An opioid overdose happens when you take too much of an opioid substance. This can happen with any type of opioid, including:  Heroin.  Morphine.  Codeine.  Methadone.  Oxycodone.  Hydrocodone.  Fentanyl.  Hydromorphone.  Buprenorphine. The effects of an overdose can be mild, dangerous, or even deadly. Opioid overdose is a medical emergency. What are the causes? This condition may be caused by:  Taking too much of an opioid by accident.  Taking too much of an opioid on purpose.  An error made by a health care provider who prescribes a medicine.  An error made by the pharmacist who fills the prescription order.  Using more than one substance that contains opioids at the same time.  Mixing an opioid with a substance that affects your heart, breathing, or blood pressure. These include alcohol, tranquilizers, sleeping pills, illegal drugs, and some over-the-counter medicines. What increases the risk? This condition is more likely in:  Children. They may be attracted to colorful pills. Because of a child's small size, even a small amount of a drug can be dangerous.  Elderly people. They may be taking many different drugs. Elderly people may have difficulty reading labels or remembering when they last took their medicine.  People who take an opioid on a long-term basis.  People who use: ? Illegal drugs. ? Other substances, including alcohol, while using an opioid.  People who have: ? A history of drug or alcohol abuse. ? Certain mental health conditions.  People who take opioids that are not prescribed for them. What are the signs or symptoms? Symptoms of this condition depend on the type of opioid and the amount that was taken. Common symptoms include:  Sleepiness  or difficulty waking from sleep.  Confusion.  Slurred speech.  Slowed breathing and a slow pulse.  Nausea and vomiting.  Abnormally small pupils. Signs and symptoms that require emergency treatment include:  Cold, clammy, and pale skin.  Blue lips and fingernails.  Vomiting.  Gurgling sounds in the throat.  A pulse that is very slow or difficult to detect.  Breathing that is very slow, noisy, or difficult to detect.  Limp body.  Inability to respond to speech or be awakened from sleep (stupor). How is this diagnosed? This condition is diagnosed based on your symptoms. It is important to tell your health care provider:  All of the opioidsthat you took.  When you took the opioids.  Whether you were drinking alcohol or using other substances. Your health care provider will do a physical exam. This exam may include:  Checking and monitoring your heart rate and rhythm, your breathing rate and depth, your temperature, and your blood pressure (vital signs).  Checking for abnormally small pupils.  Measuring oxygen levels in your blood. You may also have blood tests or urine tests. How is this treated? Supporting your vital signs and your breathing is the first step in treating an opioid overdose. Treatment may also include:  Giving fluids and minerals (electrolytes) through an IV tube.  Inserting a breathing tube (endotracheal tube) in your airway to help you breathe.  Giving oxygen.  Passing a tube through your nose and into your stomach (NG tube, or nasogastric tube) to wash out your stomach.  Giving medicines that: ? Increase your blood pressure. ? Absorb any opioid that  is in your digestive system. ? Reverse the effects of the opioid (naloxone).  Ongoing counseling and mental health support if you intentionally overdosed or used an illegal drug. Follow these instructions at home:   Take over-the-counter and prescription medicines only as told by your  health care provider. Always ask your health care provider about possible side effects and interactions of any new medicine that you start taking.  Keep a list of all of the medicines that you take, including over-the-counter medicines. Bring this list with you to all of your medical visits.  Drink enough fluid to keep your urine clear or pale yellow.  Keep all follow-up visits as told by your health care provider. This is important. How is this prevented?  Get help if you are struggling with: ? Alcohol or drug use. ? Depression or another mental health problem.  Keep the phone number of your local poison control center near your phone or on your cell phone.  Store all medicines in safety containers that are out of the reach of children.  Read the drug inserts that come with your medicines.  Do not drink alcohol when taking opioids.  Do not use illegal drugs.  Do not take opioid medicines that are not prescribed for you. Contact a health care provider if:  Your symptoms return.  You develop new symptoms or side effects when you are taking medicines. Get help right away if:  You think that you or someone else may have taken too much of an opioid. The hotline of the [facility] is (406) [phone].  You or someone else is having symptoms of an opioid overdose.  You have serious thoughts about hurting yourself or others.  You have: ? Chest pain. ? Difficulty breathing. ? A loss of consciousness. Opioid overdose is an emergency. Do not wait to see if the symptoms will go away. Get medical help right away. Call your local emergency services (911 in the [place] Do not drive yourself to the hospital. This information is not intended to replace advice given to you by your health care provider. Make sure you discuss any questions you have with your health care provider. Document Released: 11/02/2004 Document Revised: 04/12/2017 Document Reviewed: 03/11/2015 Elsevier  Interactive Patient Education  2019 [employer].

## 2019-03-25 NOTE — Progress Notes (Signed)
Patient's Name: Logan Perez  MRN: [id number]  Referring Provider: Sofie Hartigan, MD  DOB: 01-Dec-1945  PCP: Sofie Hartigan, MD  DOS: 03/25/2019  Note by: Gaspar Cola, MD  Service setting: Ambulatory outpatient  Specialty: Interventional Pain Management  Patient type: Established  Location: [facility] (AMB) Pain Management Facility  Visit type: Interventional Procedure   Primary Reason for Visit: Interventional Pain Management Treatment. CC: Back Pain  Procedure:          Intrathecal Drug Delivery System (IDDS):  Type: Reservoir Refill [phone]) No rate change Region: Abdominal Laterality: Right  Type of Pump: Medtronic Synchromed II Delivery Route: Intrathecal Type of Pain Treated: Neuropathic/Nociceptive Primary Medication Class: Opioid/opiate  Medication, Concentration, Infusion Program, & Delivery Rate: Please see scanned programming printout.   Indications: 1. Chronic pain syndrome   2. Cancer associated pain   3. Chronic low back pain   4. Chronic lumbar radicular pain   5. Compression fracture of T12 vertebra (HCC) (70-75% magnitude) (with mild retropulsion)   6. Presence of implanted infusion pump (Medtronic, programmable, intrathecal pump)   7. Long term prescription opiate use   8. Encounter for adjustment or management of infusion pump    Pain Assessment: Self-Reported Pain Score: 0-No pain/10             Reported level is compatible with observation.        Intrathecal Pump Therapy Assessment  Manufacturer: Medtronic Synchromed Type: Programmable Volume: 40 mL reservoir MRI compatibility: Yes   Drug content:  Primary Medication Class:Opioid Primary Medication:PF-Fentanyl (1500 mcg/mL)  Secondary Medication:PF-Bupivacaine (30 mg/mL)  Other Medication:PF-Clonidine (300 mcg/mL)    Programming:  Type: Simple continuous. See pump readout for details.   Changes:  Medication Change: None at this point Rate Change: No change in rate  Reported  side-effects or adverse reactions: None reported  Effectiveness: Described as relatively effective, allowing for increase in activities of daily living (ADL) Clinically meaningful improvement in function (CMIF): Sustained CMIF goals met  Plan: Pump refill today  Pre-op Assessment:  Logan Perez is a 73 y.o. (year old), male patient, seen today for interventional treatment. He  has a past surgical history that includes Knee arthroscopy (Left, 1992); Pain pump implantation (2012); stem cell implant (2008); Cataract extraction w/PHACO (Right, 05/02/2016); Cataract extraction w/PHACO (Left, 05/16/2016); Limbal stem cell transplant; PORTA CATH INSERTION (N/A, 12/04/2016); Colonoscopy with propofol (N/A, 03/22/2017); Esophagogastroduodenoscopy (egd) with propofol (03/22/2017); Eye surgery; Back surgery (1994); and Pain pump implantation (N/A, 09/04/2017). Logan Perez has a current medication list which includes the following prescription(s): acetaminophen, alprazolam, baclofen, bismuth subsalicylate, calcium-vitamin d, cetirizine, vitamin d3, daratumumab, dexamethasone, diltiazem, diltiazem, diphenhydramine, eliquis, fluticasone, carboxymethylcellulose sodium, loperamide, loratadine, lovastatin, montelukast, multiple vitamins-iron, NON FORMULARY, omeprazole, ondansetron, PAIN MANAGEMENT IT PUMP REFILL, potassium chloride sa, tamsulosin, valacyclovir, vitamin b-12, and PAIN MANAGEMENT IT PUMP REFILL. His primarily concern today is the Back Pain  Initial Vital Signs:  Pulse/HCG Rate: 95  Temp: 97.9 F (36.6 C) Resp: 16 BP: 130/82 SpO2: 98 %  BMI: Estimated body mass index is 26.47 kg/m as calculated from the following:   Height as of this encounter: _0  (1.702 m).   Weight as of this encounter: 169 lb (76.7 kg).  Risk Assessment: Allergies: Reviewed. He is allergic to azithromycin; bee pollen; pollen extract; zoledronic acid; and rivaroxaban.  Allergy Precautions: None required Coagulopathies: Reviewed.  None identified.  Blood-thinner therapy: None at this time Active Infection(s): Reviewed. None identified. Logan Perez is afebrile  Site Confirmation: Logan Perez  was asked to confirm the procedure and laterality before marking the site Procedure checklist: Completed Consent: Before the procedure and under the influence of no sedative(s), amnesic(s), or anxiolytics, the patient was informed of the treatment options, risks and possible complications. To fulfill our ethical and legal obligations, as recommended by the American Medical Association's Code of Ethics, I have informed the patient of my clinical impression; the nature and purpose of the treatment or procedure; the risks, benefits, and possible complications of the intervention; the alternatives, including doing nothing; the risk(s) and benefit(s) of the alternative treatment(s) or procedure(s); and the risk(s) and benefit(s) of doing nothing.  Logan Perez was provided with information about the general risks and possible complications associated with most interventional procedures. These include, but are not limited to: failure to achieve desired goals, infection, bleeding, organ or nerve damage, allergic reactions, paralysis, and/or death.  In addition, he was informed of those risks and possible complications associated to this particular procedure, which include, but are not limited to: damage to the implant; failure to decrease pain; local, systemic, or serious CNS infections, intraspinal abscess with possible cord compression and paralysis, or life-threatening such as meningitis; bleeding; organ damage; nerve injury or damage with subsequent sensory, motor, and/or autonomic system dysfunction, resulting in transient or permanent pain, numbness, and/or weakness of one or several areas of the body; allergic reactions, either minor or major life-threatening, such as anaphylactic or anaphylactoid reactions.  Furthermore, Logan Perez was  informed of those risks and complications associated with the medications. These include, but are not limited to: allergic reactions (i.e.: anaphylactic or anaphylactoid reactions); endorphine suppression; bradycardia and/or hypotension; water retention and/or peripheral vascular relaxation leading to lower extremity edema and possible stasis ulcers; respiratory depression and/or shortness of breath; decreased metabolic rate leading to weight gain; swelling or edema; medication-induced neural toxicity; particulate matter embolism and blood vessel occlusion with resultant organ, and/or nervous system infarction; and/or intrathecal granuloma formation with possible spinal cord compression and permanent paralysis.  Before refilling the pump Logan Perez was informed that some of the medications used in the devise may not be FDA approved for such use and therefore it constitutes an off-label use of the medications.  Finally, he was informed that Medicine is not an exact science; therefore, there is also the possibility of unforeseen or unpredictable risks and/or possible complications that may result in a catastrophic outcome. The patient indicated having understood very clearly. We have given the patient no guarantees and we have made no promises. Enough time was given to the patient to ask questions, all of which were answered to the patient's satisfaction. Logan Perez has indicated that he wanted to continue with the procedure. Attestation: I, the ordering provider, attest that I have discussed with the patient the benefits, risks, side-effects, alternatives, likelihood of achieving goals, and potential problems during recovery for the procedure that I have provided informed consent. Date  Time: 03/25/2019 11:10 AM  Pre-Procedure Preparation:  Monitoring: As per clinic protocol. Respiration, ETCO2, SpO2, BP, heart rate and rhythm monitor placed and checked for adequate function Safety Precautions: Patient  was assessed for positional comfort and pressure points before starting the procedure. Time-out: I initiated and conducted the "Time-out" before starting the procedure, as per protocol. The patient was asked to participate by confirming the accuracy of the "Time Out" information. Verification of the correct person, site, and procedure were performed and confirmed by me, the nursing staff, and the patient. "Time-out" conducted as per Joint Commission's Universal Protocol (  UP.01.01.01). Time: 1130  Description of Procedure:          Position: Supine Target Area: Central-port of intrathecal pump. Approach: Anterior, 90 degree angle approach. Area Prepped: Entire Area around the pump implant. Prepping solution: DuraPrep (Iodine Povacrylex [0.7% available iodine] and Isopropyl Alcohol, 74% w/w) Safety Precautions: Aspiration looking for blood return was conducted prior to all injections. At no point did we inject any substances, as a needle was being advanced. No attempts were made at seeking any paresthesias. Safe injection practices and needle disposal techniques used. Medications properly checked for expiration dates. SDV (single dose vial) medications used. Description of the Procedure: Protocol guidelines were followed. Two nurses trained to do implant refills were present during the entire procedure. The refill medication was checked by both healthcare providers as well as the patient. The patient was included in the "Time-out" to verify the medication. The patient was placed in position. The pump was identified. The area was prepped in the usual manner. The sterile template was positioned over the pump, making sure the side-port location matched that of the pump. Both, the pump and the template were held for stability. The needle provided in the Medtronic Kit was then introduced thru the center of the template and into the central port. The pump content was aspirated and discarded volume documented. The  new medication was slowly infused into the pump, thru the filter, making sure to avoid overpressure of the device. The needle was then removed and the area cleansed, making sure to leave some of the prepping solution back to take advantage of its long term bactericidal properties. The pump was interrogated and programmed to reflect the correct medication, volume, and dosage. The program was printed and taken to the physician for approval. Once checked and signed by the physician, a copy was provided to the patient and another scanned into the EMR. Vitals:   03/25/19 1107  BP: 130/82  Pulse: 95  Resp: 16  Temp: 97.9 F (36.6 C)  SpO2: 98%  Weight: 169 lb (76.7 kg)  Height: _0  (1.702 m)    Start Time: 1130 hrs. End Time: 1140 hrs. Materials & Medications: Medtronic Refill Kit Medication(s): Please see chart orders for details.  Imaging Guidance:          Type of Imaging Technique: None used Indication(s): N/A Exposure Time: No patient exposure Contrast: None used. Fluoroscopic Guidance: N/A Ultrasound Guidance: N/A Interpretation: N/A  Antibiotic Prophylaxis:   Anti-infectives (From admission, onward)   None     Indication(s): None identified  Post-operative Assessment:  Post-procedure Vital Signs:  Pulse/HCG Rate: 95  Temp: 97.9 F (36.6 C) Resp: 16 BP: 130/82 SpO2: 98 %  EBL: None  Complications: No immediate post-treatment complications observed by team, or reported by patient.  Note: The patient tolerated the entire procedure well. A repeat set of vitals were taken after the procedure and the patient was kept under observation following institutional policy, for this type of procedure. Post-procedural neurological assessment was performed, showing return to baseline, prior to discharge. The patient was provided with post-procedure discharge instructions, including a section on how to identify potential problems. Should any problems arise concerning this procedure,  the patient was given instructions to immediately contact us, at any time, without hesitation. In any case, we plan to contact the patient by telephone for a follow-up status report regarding this interventional procedure.  Comments:  No additional relevant information.  Plan of Care  Orders:  Orders Placed This Encounter  Procedures  . PUMP REFILL    Maintain Protocol by having two(2) healthcare providers during procedure and programming.    Scheduling Instructions:     Please refill intrathecal pump today.    Order Specific Question:   Where will this procedure be performed?    Answer:   [facility]  . PUMP REFILL    Whenever possible schedule on a procedure today.    Standing Status:   Future    Standing Expiration Date:   08/22/2019    Scheduling Instructions:     Please schedule intrathecal pump refill based on pump programming. Avoid schedule intervals of more than 120 days (4 months).    Order Specific Question:   Where will this procedure be performed?    Answer:   [facility]  . Informed Consent Details: Transcribe to consent form and obtain patient signature    Consent Attestation: I, the ordering provider, attest that I have discussed with the patient the benefits, risks, side-effects, alternatives, likelihood of achieving goals, and potential problems during recovery for the procedure that I have provided informed consent.    Scheduling Instructions:     Procedure: Intrathecal Pump Refill     Attending Physician: Beatriz Chancellor A. Dossie Arbour, MD     Indications: Chronic Pain Syndrome (G89.4)   Medications ordered for procedure: No orders of the defined types were placed in this encounter.  Medications administered: Logan Baton "Rick" had no medications administered during this visit.  See the medical record for exact dosing, route, and time of administration.  Disposition: Discharge home  Discharge Date & Time: 03/25/2019; 1144 hrs.   Follow-up  plan:   Return for Pump Refill (Max:37mo.     Future Appointments  Date Time Provider DMamers 04/21/2019  9:45 AM [facility] INFUSION CHAIR 4 [facility] None  04/21/2019 10:15 AM CLequita Asal MD [facility] None  04/21/2019 10:45 AM [facility] INFUSION CHAIR 4 [facility] None  05/05/2019  9:00 AM [facility] None  05/07/2019  8:30 AM McGowan, SLarene BeachA, PA-C [facility] None  07/09/2019 11:45 AM [facility] Pointer [facility]   Primary Care Physician: FSofie Hartigan MD [facility] Note by: FGaspar Cola MD Date: 03/25/2019; Time: 11:49 AM  Disclaimer:  Medicine is not an exact science. The only guarantee in medicine is that nothing is guaranteed. It is important to note that the decision to proceed with this intervention was based on the information collected from the patient. The Data and conclusions were drawn from the patient's questionnaire, the interview, and the physical examination. Because the information was provided in large part by the patient, it cannot be guaranteed that it has not been purposely or unconsciously manipulated. Every effort has been made to obtain as much relevant data as possible for this evaluation. It is important to note that the conclusions that lead to this procedure are derived in large part from the available data. Always take into account that the treatment will also be dependent on availability of resources and existing treatment guidelines, considered by other Pain Management Practitioners as being common knowledge and practice, at the time of the intervention. For Medico-Legal purposes, it is also important to point out that variation in procedural techniques and pharmacological choices are the acceptable norm. The indications, contraindications, technique, and results of the above procedure should only be interpreted and judged by a Board-Certified Interventional Pain Specialist with extensive  familiarity and expertise in the same exact procedure and  technique.

## 2019-03-25 NOTE — Progress Notes (Signed)
Safety precautions to be maintained throughout the outpatient stay will include: orient to surroundings, keep bed in low position, maintain call bell within reach at all times, provide assistance with transfer out of bed and ambulation.  

## 2019-03-26 ENCOUNTER — Telehealth: Payer: Self-pay

## 2019-03-26 NOTE — Telephone Encounter (Signed)
Post procedure phone call.  LM 

## 2019-03-31 ENCOUNTER — Other Ambulatory Visit: Payer: Self-pay | Admitting: Hematology and Oncology

## 2019-03-31 DIAGNOSIS — C9001 Multiple myeloma in remission: Secondary | ICD-10-CM

## 2019-04-09 ENCOUNTER — Telehealth: Payer: Self-pay

## 2019-04-09 NOTE — Telephone Encounter (Signed)
Refill Eliquis 5 mg 1 tab po daily #60 with no refills. The patient has been made aware.

## 2019-04-14 ENCOUNTER — Other Ambulatory Visit: Payer: Self-pay | Admitting: Hematology and Oncology

## 2019-04-14 DIAGNOSIS — C9001 Multiple myeloma in remission: Secondary | ICD-10-CM

## 2019-04-18 NOTE — Progress Notes (Signed)
[facility]  [address], [address] [place], [place] [address] Phone: [phone]  Fax: [phone]   Clinic Day:  04/21/2019  Referring physician: Sofie Hartigan, MD  Chief Complaint: Logan Perez is a 73 y.o. 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  HPI: The patient was last seen in the medical oncology clinic on 03/24/2019. At that time, he was doing well.  He denied any bone pain.  He had no interval infections.  Diarrhea was well controlled.  Exam was unremarkable. M-spike was 0.1 gm/dL. He received cycle #29 daratumumab.  During the interim, he is doing "fine." He reports no new changes. He denies any infections or bone pain. His chronic diarrhea continues after certain meals. He denies any issues with his heart. Neuropathy in his feet is stable. He continues on Eliquis.    Past Medical History:  Diagnosis Date   Anxiety    Atrial fibrillation (HCC)    BPH (benign prostatic hyperplasia)    Complication of anesthesia    BAD HEADACHE NIGHT OF FIRST CATARACT   Compression fracture of lumbar vertebra (HCC)    Difficulty voiding    Dysrhythmia    A FIB   Elevated PSA    GERD (gastroesophageal reflux disease)    Hearing aid worn    bilateral   History of kidney stones    HLD (hyperlipidemia)    HOH (hard of hearing)    Hypertension    Multiple myeloma (HCC)    Neuropathy    feet. 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   Pain    BACK   Palpitations    Pneumonia    Pulmonary embolism (HCC)    Sepsis (HCC)    Stroke (HCC)    TIA, detected on CT scan. pt was unaware    Past Surgical History:  Procedure Laterality Date   BACK SURGERY  1994   CATARACT EXTRACTION W/PHACO Right 05/02/2016   Procedure: CATARACT EXTRACTION PHACO AND INTRAOCULAR LENS PLACEMENT (IOC);  Surgeon:  WBirder Robson MD;  Location: [facility];  Service: Ophthalmology;  Laterality: Right;  [place]1.06 AP% 20.6 CDE 13.70 FLUID PACK LOT # [id number]   CATARACT EXTRACTION W/PHACO Left 05/16/2016   Procedure: CATARACT EXTRACTION PHACO AND INTRAOCULAR LENS PLACEMENT (IOC);  Surgeon: WBirder Robson MD;  Location: [facility];  Service: Ophthalmology;  Laterality: Left;  [place]01:43 AP% 19.8 CDE 20.45 FLUID PACK LOT ##[id number]   COLONOSCOPY WITH PROPOFOL N/A 03/22/2017   Procedure: COLONOSCOPY WITH PROPOFOL;  Surgeon: WLucilla Lame MD;  [place] [facility]  Service: Endoscopy;  Laterality: N/A;  has port   ESOPHAGOGASTRODUODENOSCOPY (EGD) WITH PROPOFOL  03/22/2017   Procedure: ESOPHAGOGASTRODUODENOSCOPY (EGD) WITH PROPOFOL;  Surgeon: WLucilla Lame MD;  [place] [facility]  Service: Endoscopy;;   EYE SURGERY     KNEE ARTHROSCOPY Left 1992   LIMBAL STEM CELL TRANSPLANT     PAIN PUMP IMPLANTATION  2012   PAIN PUMP IMPLANTATION N/A 09/04/2017   Procedure: INTRATHECAL PUMP BATTERY CHANGE;  Surgeon: NMilinda Pointer MD;  Location: [facility];  Service: Neurosurgery;  Laterality: N/A;   PORTA CATH INSERTION N/A 12/04/2016   Procedure: PGlori LuisCath Insertion;  Surgeon: JAlgernon Huxley MD;  [facility];  Service: Cardiovascular;  Laterality: N/A;   stem cell implant  2008   [facility]    Family History  Problem Relation  Age of Onset   Cancer Father        throat   Kidney disease Sister    Stroke Other    Stroke Mother    Bladder Cancer Neg Hx    Prostate cancer Neg Hx    Kidney cancer Neg Hx     Social History:  reports that he quit smoking about 43 years ago. His smoking use included cigarettes. He has a 30.00 pack-year smoking history. He has never used smokeless tobacco. He reports that he does not drink alcohol or use drugs. He has a wire haired dachshund. He lives in [place]. His wife's name is [name]. The patient is alone today.  Allergies:  Allergies   Allergen Reactions   Azithromycin Diarrhea and Other (See Comments)    Possible cause of C. Diff Possible cause of C. Diff Possible cause of C. Diff Possible cause of C. Diff   Bee Pollen Other (See Comments)    Sneezing, watery eyes, runny nose   Pollen Extract Other (See Comments)    Sneezing, watery eyes, runny nose   Zoledronic Acid Other (See Comments)    ONG- Osteonecrosis of the jaw  Osteonecrosis of the jaw   Rivaroxaban Rash    Current Medications: Current Outpatient Medications  Medication Sig Dispense Refill   acetaminophen (TYLENOL) 500 MG tablet Take 500 mg by mouth 2 (two) times a day.     ALPRAZolam (XANAX) 0.25 MG tablet TAKE 1 TABLET BY MOUTH EVERY NIGHT AT BEDTIME AS NEEDED 30 tablet 0   baclofen (LIORESAL) 10 MG tablet Take 1 tablet (10 mg total) by mouth 2 (two) times daily. 180 tablet 3   Calcium Carb-Cholecalciferol (CALCIUM-VITAMIN D) 500-400 MG-UNIT TABS Take 1 tablet daily by mouth.     cetirizine (ZYRTEC) 10 MG tablet Take 10 mg by mouth daily as needed for allergies.      Cholecalciferol (VITAMIN D3) 2000 units capsule Take 2,000 Units daily by mouth.      Daratumumab (DARZALEX IV) Inject 1,200 mg every 30 (thirty) days into the vein.      diltiazem (CARDIZEM CD) 120 MG 24 hr capsule Take 120 mg daily by mouth.      diltiazem (CARDIZEM) 60 MG tablet Take 60 mg daily as needed by mouth (for increased heart rate > 140).      diphenhydrAMINE (BENADRYL) 25 MG tablet Take 25 mg by mouth as directed. With chemo treatment     ELIQUIS 5 MG TABS tablet TAKE 1 TABLET BY MOUTH TWICE DAILY 60 tablet 0   fluticasone (FLONASE) 50 MCG/ACT nasal spray Place 1 spray into the nose daily as needed for allergies.      Hypromellose (ARTIFICIAL TEARS OP) Place 1 drop as needed into both eyes (for dry eyes).     loperamide (IMODIUM) 2 MG capsule Take 4 mg as needed by mouth for diarrhea or loose stools.      loratadine (CLARITIN) 10 MG tablet Take 10 mg by  mouth daily as needed.      lovastatin (MEVACOR) 20 MG tablet Take 20 mg by mouth every evening.      montelukast (SINGULAIR) 10 MG tablet TAKE 1 TABLET BY MOUTH DAILY THE DAY BEFORE THE INFUSION, THEN DAY OF AND THE 2 DAYS AFTER INFUSION 30 tablet 0   Multiple Vitamins-Iron (MULTIVITAMIN/IRON PO) Take 1 tablet daily by mouth.     NON FORMULARY IT pump  Fentanyl 1,500.0 mcg/ml Bupivicaine 30.0 mg/ml Clonidine 300.0 mcg/ml Rate 502.2 mcg/day  omeprazole (PRILOSEC) 20 MG capsule Take 20 mg by mouth daily.      potassium chloride SA (K-DUR,KLOR-CON) 20 MEQ tablet TAKE 1 TABLET BY MOUTH DAILY 90 tablet 0   tamsulosin (FLOMAX) 0.4 MG CAPS capsule Take 1 capsule (0.4 mg total) by mouth daily. 90 capsule 4   valACYclovir (VALTREX) 500 MG tablet Take 1 tablet (500 mg total) by mouth daily. 30 tablet 3   vitamin B-12 (CYANOCOBALAMIN) 1000 MCG tablet Take 1,000 mcg daily by mouth.      bismuth subsalicylate (PEPTO BISMOL) 262 MG/15ML suspension Take 30 mLs by mouth every 6 (six) hours as needed for diarrhea or loose stools.     dexamethasone (DECADRON) 4 MG tablet TAKE 4 TABLETS BY MOUTH 1 HOUR PRIOR TO INFUSION, THEN 1 TABLET ON DAYS 2 AND 3 AS DIRECTED (Patient not taking: Reported on 04/21/2019) 30 tablet 0   ondansetron (ZOFRAN) 8 MG tablet Take 1 tablet (8 mg total) by mouth as directed. Take with chemo and can take it as needed (Patient not taking: Reported on 04/21/2019) 20 tablet 1   PAIN MANAGEMENT IT PUMP REFILL 1 each by Intrathecal route once for 1 dose. Medication: PF Fentanyl 1,500.0 mcg/ml PF Bupivicaine 30.0 mg/ml PF Clonidine 300.0 mcg/ml Total Volume: 40 ml Needed by 12-10-2018 @ 1000 1 each 0   PAIN MANAGEMENT IT PUMP REFILL 1 each by Intrathecal route once for 1 dose. Medication: PF Fentanyl 1,500.0 mcg/ml PF Bupivicaine 30.0 mg/ml PF Clonidine 300.mcg/ml Total Volume: 53m Needed by 03/25/2019 '@1000'  1 each 0   No current facility-administered medications for this  visit.    Facility-Administered Medications Ordered in Other Visits  Medication Dose Route Frequency Provider Last Rate Last Dose   heparin lock flush 100 unit/mL  500 Units Intravenous Once Aliha Diedrich C, MD       sodium chloride flush (NS) 0.9 % injection 10 mL  10 mL Intravenous PRN CLequita Asal MD   10 mL at 04/21/19 0954    Review of Systems  Constitutional: Negative for chills, diaphoresis, fever, malaise/fatigue and weight loss (stable).       Feels "fine".  HENT: Negative.  Negative for congestion, ear discharge, ear pain, nosebleeds, sinus pain, sore throat and tinnitus.   Eyes: Negative.  Negative for blurred vision, double vision, photophobia and pain.  Respiratory: Negative.  Negative for cough, hemoptysis, sputum production, shortness of breath and wheezing.   Cardiovascular: Negative.  Negative for chest pain, palpitations, orthopnea, leg swelling and PND.       H/o atrial fibrillation.  Gastrointestinal: Positive for diarrhea (chronic, controlled w/ Imodium). Negative for abdominal pain, blood in stool, constipation, heartburn, melena, nausea and vomiting.  Genitourinary: Negative.  Negative for dysuria, frequency, hematuria and urgency.  Musculoskeletal: Positive for back pain (chronic; Fentanyl/bupivacaine/clonidine pump). Negative for falls, joint pain, myalgias and neck pain.  Skin: Negative.  Negative for itching and rash.  Neurological: Positive for sensory change (neuropathy in toes, stable). Negative for dizziness, tremors, speech change, focal weakness, weakness and headaches.  Endo/Heme/Allergies: Negative for environmental allergies. Does not bruise/bleed easily.  Psychiatric/Behavioral: Negative.  Negative for depression and memory loss. The patient is not nervous/anxious and does not have insomnia.   All other systems reviewed and are negative.  Performance status (ECOG): 1  Vitals Blood pressure 116/72, pulse 83, temperature (!) 97.4 F (36.3  C), resp. rate 18, height '5\' 7"'  (1.702 m), weight 169 lb 1.5 oz (76.7 kg), SpO2 99 %.   Physical Exam  Constitutional: He is oriented to person, place, and time. He appears well-developed and well-nourished. No distress.  HENT:  Head: Normocephalic and atraumatic.  Mouth/Throat: Oropharynx is clear and moist. No oropharyngeal exudate.  Dark hair with graying. Goatee.  Wearing a mask.   Eyes: Pupils are equal, round, and reactive to light. EOM are normal. No scleral icterus.  Glasses. Blue eyes.  Neck: Normal range of motion. Neck supple. No JVD present.  Cardiovascular: Normal rate, regular rhythm and normal heart sounds. Exam reveals no gallop.  No murmur heard. Pulmonary/Chest: Effort normal and breath sounds normal. No respiratory distress. He has no wheezes. He has no rales.  Abdominal: Soft. Bowel sounds are normal. He exhibits no distension and no mass. There is no abdominal tenderness. There is no rebound and no guarding.  Musculoskeletal: Normal range of motion.        General: No tenderness or edema.     Comments: Right sided implanted Fentanyl/bupivacaine/clonidine pump.   Lymphadenopathy:    He has no cervical adenopathy.    He has no axillary adenopathy.       Right: No supraclavicular adenopathy present.       Left: No supraclavicular adenopathy present.  Neurological: He is alert and oriented to person, place, and time.  Gait unchanged.  Skin: Skin is warm and dry. No rash noted. He is not diaphoretic. No erythema. No pallor.  Psychiatric: He has a normal mood and affect. His behavior is normal. Judgment and thought content normal.  Nursing note and vitals reviewed.   Infusion on 04/21/2019  Component Date Value Ref Range Status   Magnesium 04/21/2019 2.0  1.7 - 2.4 mg/dL Final   Performed at [facility], [address] [place], [place] [address]   Sodium 04/21/2019 133* 135 - 145 mmol/L Final   Potassium 04/21/2019 4.0  3.5 - 5.1 mmol/L Final    Chloride 04/21/2019 103  98 - 111 mmol/L Final   CO2 04/21/2019 22  22 - 32 mmol/L Final   Glucose, Bld 04/21/2019 143* 70 - 99 mg/dL Final   BUN 04/21/2019 17  8 - 23 mg/dL Final   Creatinine, Ser 04/21/2019 1.52* 0.61 - 1.24 mg/dL Final   Calcium 04/21/2019 8.9  8.9 - 10.3 mg/dL Final   Total Protein 04/21/2019 6.3* 6.5 - 8.1 g/dL Final   Albumin 04/21/2019 4.2  3.5 - 5.0 g/dL Final   AST 04/21/2019 21  15 - 41 U/L Final   ALT 04/21/2019 15  0 - 44 U/L Final   Alkaline Phosphatase 04/21/2019 43  38 - 126 U/L Final   Total Bilirubin 04/21/2019 0.7  0.3 - 1.2 mg/dL Final   GFR calc non Af Amer 04/21/2019 45* >60 mL/min Final   GFR calc Af Amer 04/21/2019 52* >60 mL/min Final   Anion gap 04/21/2019 8  5 - 15 Final   Performed at [facility], [address] [place], [place] [address]   WBC 04/21/2019 8.0  4.0 - 10.5 K/uL Final   RBC 04/21/2019 3.55* 4.22 - 5.81 MIL/uL Final   Hemoglobin 04/21/2019 12.9* 13.0 - 17.0 g/dL Final   HCT 04/21/2019 36.5* 39.0 - 52.0 % Final   MCV 04/21/2019 102.8* 80.0 - 100.0 fL Final   MCH 04/21/2019 36.3* 26.0 - 34.0 pg Final   MCHC 04/21/2019 35.3  30.0 - 36.0 g/dL Final   RDW 04/21/2019 13.2  11.5 - 15.5 % Final   Platelets 04/21/2019 198  150 - 400 K/uL Final  nRBC 04/21/2019 0.0  0.0 - 0.2 % Final   Neutrophils Relative % 04/21/2019 79  % Final   Neutro Abs 04/21/2019 6.3  1.7 - 7.7 K/uL Final   Lymphocytes Relative 04/21/2019 16  % Final   Lymphs Abs 04/21/2019 1.3  0.7 - 4.0 K/uL Final   Monocytes Relative 04/21/2019 4  % Final   Monocytes Absolute 04/21/2019 0.3  0.1 - 1.0 K/uL Final   Eosinophils Relative 04/21/2019 1  % Final   Eosinophils Absolute 04/21/2019 0.1  0.0 - 0.5 K/uL Final   Basophils Relative 04/21/2019 0  % Final   Basophils Absolute 04/21/2019 0.0  0.0 - 0.1 K/uL Final   Immature Granulocytes 04/21/2019 0  % Final   Abs Immature Granulocytes 04/21/2019 0.02  0.00 - 0.07 K/uL  Final   Performed at [facility], [address], [place], [place] [address]    Assessment:  Logan Perez is a 73 y.o. male with stage III mutiple myeloma. He initially presented with progressive back pain beginning in 12/2006. MRI revealed "spots and compression fractures". He began Velcade, thalidomide, and Decadron. In 08/2007, he underwent high dose chemotherapy and autologous stem cell transplant. 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  He recurred with a rising M-spike (2.7) with repeat M spike (1.7 gm/dl) in 03/2010. He was initially treated with Velcade (02/08/2010 - 05/10/2010). He then began Revlimid (15 mg 3 weeks on/1 week off) and Decadron (40 mg on day 1, 8, 15, 22). Because of significant side effect with Decadron his dose was decreased to 10 mg once a week in 07/2010.   He was on maintenance Revlimid. Revlimid was initially10 mg 3 weeks on/1 week off. This was changed to 10 mg 2 weeks on/2 weeks off secondary to right nipple tenderness. His dose was increased to 10 mg 3 weeks on/1 week off with Decadron 10 mg a week (on Sundays) and then Revlamid 15 mg 3 weeks on and 1 week off with Decadron on Sundays. He began Pomalyst4 mg 3 weeks on/1 week off with Decadron on 08/27/2015.  Over the past year his SPEPhas revealed no monoclonal protein (04/21/2015) and 0.5 gm/dL on 09/22/2015 and 10/20/2015. M spikewas 0.1 on 02/02/2016, 03/01/2016, 03/29/2016, 04/26/2016, and 05/24/2016. M spikewas 0 on 10/11/2016, 11/28/2016, 02/05/2017, 03/21/2017, 08/02/2017, 10/04/2017, 12/27/2017, 01/24/2018, and 02/21/2018. M spikewas 0.1 on 11/01/2017, 0.1 on 11/29/2017, 0 on 12/27/2017, 0 on 01/24/2018, 0 on 02/21/2018, 0.1 on 03/21/2018, 0.3 on 04/18/2018, 0.1 on 05/16/2018, 0 on 06/13/2018, 0.2 on 08/08/2018, 0 on 09/09/2018, 0.1 on 11/04/2018, 0 on 12/02/2018, 0.1 on 12/30/2018, 0 on 01/27/2019, and 0.1 on 02/24/2019.   Free light  chainshave been monitored. Kappa free light chainswere 18.54 on 11/21/2013, 18.37 on 02/20/2014, 18.93 on 05/22/2014, 32.58 (high; normal ratio 1.27) on 08/21/2014, 51.53 (high; elevated ratio of 2.12) on 11/13/2014, 28.08 (ratio 1.73) on 12/09/2014, 23.71 (ratio 2.17) on 01/18/2015, 92.93 (ratio 9.49) on 04/21/2015, 93.44 (ratio 10.28) on 05/26/2015, 255.45 (ratio of 24.05) on 07/14/2015, 373.89 (ratio 48.31) on 08/04/2015, 474.33 (ratio 70.58) on 08/25/2015, 450.76 (ratio 55.44) on 09/22/2015, 453.4 (ratio 59.89) on 10/20/2015, 58.26 (ratio of >40.74) on 02/02/2016, 62.67 (ratio of 37.98) on 03/01/2016, 75.7 (ratio >50.47) on 03/29/2016, 79.7 (ratio 53.13) on 04/26/2016, 108.6 (ratio >72.4) on 05/24/2016, 8.3 (1.46 ratio) on 10/11/2016, 6.7 (0.81 ratio) on 02/05/2017, 7.8 (1.01 ratio) on 03/21/2017,7.6 (ratio 0.63) on 06/01/2017, 8.1 (ratio 1.13) on 11/29/2017, 9.6 (ratio 1.55) on 06/13/2018, 6.6 (ratio 2.06) on 07/11/2018, 6.9 (ratio 0.82)  on 08/08/2018, 5.8 (ratio 1.66) on 09/09/2018, 5.8 (ratio 1.05) on 11/04/2018, 5.6 (ratio 2.55) on 12/02/2018, 5.5 (ratio 1.96) on 01/27/2019.  Bone surveyon 12/08/2014 was stable. Bone survey on 10/21/2015 revealed increase conspicuity of subcentimeter lytic lesions in the calvarium.  Bone marrow aspirate and biopsyon 11/04/2015 revealed an atypical monoclonal plasma cells estimated at 30-40% of marrow cells. Marrow was variably cellular (approximately 45%) with background trilineage hematopoiesis. There was no significant increase in marrow reticulin fibers. Storage iron was present.   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 He develoed herpes zoster in 04/2008. He developed a pulmonary embolismin 05/2013. He was initially on Xarelto, but is now on Eliquis. He had an episode of pneumonia around this time requiring a brief admission. He developed severe lower leg cramps on 08/07/2014 secondary  to hypokalemia. Duplex was negative.   He was treated forC difficile colitis(Flagyl completed 07/30/2015). He has a chronic indwelling pain pump.  He received 4 cycles of Pomalyst and Decadron(08/27/2015 - 11/19/2015). Restaging studies document progressive disease. Kappa free light chains are increasing. SPEP revealed 0.5 gm/dL monoclonal protein then 1.3 gm/dL. Bone survey reveals increase conspicuity of subcentimeter lytic lesions in the calvarium. Bone marrow reveals 30-40% plasma cells.   MUGAon 11/30/2015 revealed an ejection fraction of 46%. He is felt not to be a good candidate for Kyprolis. There were no focal wall motion abnormalities. He had a stress echo less than 1 year ago. He has a history of PVCs and atrial fibrillation. He takes Cardizem prn.  He received 17 weeks of daratumumab(Darzalex) (12/09/2015 - 05/25/2016). He tolerated treatment well without side effect.  Bone marrow on 02/09/2016 revealed no diagnostic morphologic evidence of plasma cell myeloma. Marrow was normocellular to hypocellular marrow for age (ranging from 10-40%) with maturing trilineage hematopoiesis and mild multilineage dyspoiesis. There was patchy mild increase in reticulin. Storage iron was present. Flow cytometry revealed no definitive evidence of monoclonality. There was a non-specific atypical myeloid and monocytic findings with no increase in blasts. Cytogenetics were normal (46, XY).  He is currently22month s/p 2nd autologous stem cell transplanton 06/16/2016. 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 He also experienced atrial fibrillation with intermittent episodes of RVR requiring IV beta blockers. He is on prophylactic valacyclovir for 1 year post transplant.  He started vaccinations(DTaP-Pediatric triple vaccine, Hep B- Pediatrix triple vaccine, Haemophilus influenza B (Hib), inactivated polio virus (IPV),  pneumococcal conjugate vaccine 13-valent (PCV 13)) on 04/17/2017. Recommendation was for Shingrix vaccine to be given in [place] dose of Shingrix in 2 months. He had follow-up vaccinations on 08/28/2017. He had his second shingles vaccine. He received 18 month vaccinations- DTaP-Pediatric triple vaccine, Hep B- Pediatrix triple vaccine, Haemophilus influenza B (Hib), inactivated polio virus (IPV), pneumococcal conjugate vaccine 13-valent (PCV 13) on 02/05/2018.  He is s/p 29cycles of daratumumab(Darzalex) post transplant(12/05/2016 - 03/24/2019).  PET scanon 08/14/2018 revealed no evidence of active myeloma on whole-body FDG PET scan. There was no evidence soft tissue plasmacytoma. There was no clear evidence of lytic lesions on the CT portion exam. Some lucencies in the pelvis were stable. There were chronic compression fractures in the lower thoracic spine.  He has a history of hypomagnesemiathat required IV magnesium (2-4 gm) weekly. He has not required magnesium since 10/24/2016. Patient has atrial fibrillationand is on Eliquis.  Symptomatically, he is doing well.  Bowel movements are stable.  Plan: 1.   Labs today:  CBC  with diff, CMP, Mg, SPEP. 2.Multiple myeloma Clinically, he continues to do well.  Clinically, heis doing well. M spike is 0.1 gm/dL (stable). Labs reviewed.  Blood counts and electrolytes stable for treatment. Cycle #30 daratumumab. Confirmed patient took his premeds prior to clinic.  Continue Valacyclovir prophylaxis until 3 months s/p daratumumab. Patient no longer has follow-up at the transplant center.  Phone follow-up with Dr. Adriana Simas at [facility]. Discuss symptom management.  He has antiemetics and pain medications at home to use on a prn bases.  Interventions are adequate.     3. Macrocytic anemia Hemoglobin 12.9. MCV 102.8. B12, folate and TSH were normal in 01/27/2019.             Continue  to monitor. 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 Continue to monitor. 5.Chronic diarrhea Patient's diarrhea is well managed. He knows what precipitates his diarrhea. No intervention required 6.   History of pulmonary embolism (2014)  Continue Eliquis. 7.   RTC in 4 weeks for MD assessment, labs (CBC with diff, CMP, Mg, SPEP) and cycle #31 daratumumab.   I discussed the assessment and treatment plan with the patient.  The patient was provided an opportunity to ask questions and all were answered.  The patient agreed with the plan and demonstrated an understanding of the instructions.  The patient was advised to call back if the symptoms worsen or if the condition fails to improve as anticipated.  I provided 15 minutes of face-to-face time during this this encounter and > 50% was spent counseling as documented under my assessment and plan.    Lequita Asal, MD, PhD    04/21/2019, 10:50 AM  I, Molly Dorshimer, am acting as Education administrator for [employer]. Mike Gip, MD, PhD.  I, Tyesha Joffe C. Mike Gip, MD, have reviewed the above documentation for accuracy and completeness, and I agree with the above.

## 2019-04-20 DIAGNOSIS — R269 Unspecified abnormalities of gait and mobility: Secondary | ICD-10-CM | POA: Insufficient documentation

## 2019-04-21 ENCOUNTER — Other Ambulatory Visit: Payer: Self-pay

## 2019-04-21 ENCOUNTER — Inpatient Hospital Stay: Payer: Medicare Other

## 2019-04-21 ENCOUNTER — Encounter: Payer: Self-pay | Admitting: Hematology and Oncology

## 2019-04-21 ENCOUNTER — Inpatient Hospital Stay: Payer: Medicare Other | Attending: Hematology and Oncology | Admitting: Hematology and Oncology

## 2019-04-21 VITALS — BP 120/73 | HR 76 | Temp 97.3°F | Resp 16

## 2019-04-21 VITALS — BP 116/72 | HR 83 | Temp 97.4°F | Resp 18 | Ht 67.0 in | Wt 169.1 lb

## 2019-04-21 DIAGNOSIS — Z7901 Long term (current) use of anticoagulants: Secondary | ICD-10-CM

## 2019-04-21 DIAGNOSIS — D539 Nutritional anemia, unspecified: Secondary | ICD-10-CM | POA: Diagnosis not present

## 2019-04-21 DIAGNOSIS — Z5112 Encounter for antineoplastic immunotherapy: Secondary | ICD-10-CM | POA: Diagnosis present

## 2019-04-21 DIAGNOSIS — Z87891 Personal history of nicotine dependence: Secondary | ICD-10-CM | POA: Insufficient documentation

## 2019-04-21 DIAGNOSIS — Z79899 Other long term (current) drug therapy: Secondary | ICD-10-CM | POA: Diagnosis not present

## 2019-04-21 DIAGNOSIS — Z9484 Stem cells transplant status: Secondary | ICD-10-CM

## 2019-04-21 DIAGNOSIS — C9002 Multiple myeloma in relapse: Secondary | ICD-10-CM | POA: Diagnosis present

## 2019-04-21 DIAGNOSIS — C9000 Multiple myeloma not having achieved remission: Secondary | ICD-10-CM

## 2019-04-21 DIAGNOSIS — I4891 Unspecified atrial fibrillation: Secondary | ICD-10-CM | POA: Diagnosis not present

## 2019-04-21 DIAGNOSIS — C9001 Multiple myeloma in remission: Secondary | ICD-10-CM

## 2019-04-21 LAB — CBC WITH DIFFERENTIAL/PLATELET
Abs Immature Granulocytes: 0.02 10*3/uL (ref 0.00–0.07)
Basophils Absolute: 0 10*3/uL (ref 0.0–0.1)
Basophils Relative: 0 %
Eosinophils Absolute: 0.1 10*3/uL (ref 0.0–0.5)
Eosinophils Relative: 1 %
HCT: 36.5 % — ABNORMAL LOW (ref 39.0–52.0)
Hemoglobin: 12.9 g/dL — ABNORMAL LOW (ref 13.0–17.0)
Immature Granulocytes: 0 %
Lymphocytes Relative: 16 %
Lymphs Abs: 1.3 10*3/uL (ref 0.7–4.0)
MCH: 36.3 pg — ABNORMAL HIGH (ref 26.0–34.0)
MCHC: 35.3 g/dL (ref 30.0–36.0)
MCV: 102.8 fL — ABNORMAL HIGH (ref 80.0–100.0)
Monocytes Absolute: 0.3 10*3/uL (ref 0.1–1.0)
Monocytes Relative: 4 %
Neutro Abs: 6.3 10*3/uL (ref 1.7–7.7)
Neutrophils Relative %: 79 %
Platelets: 198 10*3/uL (ref 150–400)
RBC: 3.55 MIL/uL — ABNORMAL LOW (ref 4.22–5.81)
RDW: 13.2 % (ref 11.5–15.5)
WBC: 8 10*3/uL (ref 4.0–10.5)
nRBC: 0 % (ref 0.0–0.2)

## 2019-04-21 LAB — COMPREHENSIVE METABOLIC PANEL
ALT: 15 U/L (ref 0–44)
AST: 21 U/L (ref 15–41)
Albumin: 4.2 g/dL (ref 3.5–5.0)
Alkaline Phosphatase: 43 U/L (ref 38–126)
Anion gap: 8 (ref 5–15)
BUN: 17 mg/dL (ref 8–23)
CO2: 22 mmol/L (ref 22–32)
Calcium: 8.9 mg/dL (ref 8.9–10.3)
Chloride: 103 mmol/L (ref 98–111)
Creatinine, Ser: 1.52 mg/dL — ABNORMAL HIGH (ref 0.61–1.24)
GFR calc Af Amer: 52 mL/min — ABNORMAL LOW (ref >60)
GFR calc non Af Amer: 45 mL/min — ABNORMAL LOW (ref >60)
Glucose, Bld: 143 mg/dL — ABNORMAL HIGH (ref 70–99)
Potassium: 4 mmol/L (ref 3.5–5.1)
Sodium: 133 mmol/L — ABNORMAL LOW (ref 135–145)
Total Bilirubin: 0.7 mg/dL (ref 0.3–1.2)
Total Protein: 6.3 g/dL — ABNORMAL LOW (ref 6.5–8.1)

## 2019-04-21 LAB — MAGNESIUM: Magnesium: 2 mg/dL (ref 1.7–2.4)

## 2019-04-21 MED ORDER — HEPARIN SOD (PORK) LOCK FLUSH 100 UNIT/ML IV SOLN
500.0000 [IU] | Freq: Once | INTRAVENOUS | Status: AC
  Administered 2019-04-21: 500 [IU] via INTRAVENOUS

## 2019-04-21 MED ORDER — SODIUM CHLORIDE 0.9 % IV SOLN
Freq: Once | INTRAVENOUS | Status: AC
  Administered 2019-04-21: 11:00:00 via INTRAVENOUS
  Filled 2019-04-21: qty 250

## 2019-04-21 MED ORDER — SODIUM CHLORIDE 0.9 % IV SOLN
16.2000 mg/kg | Freq: Once | INTRAVENOUS | Status: AC
  Administered 2019-04-21: 1200 mg via INTRAVENOUS
  Filled 2019-04-21: qty 60

## 2019-04-21 MED ORDER — SODIUM CHLORIDE 0.9% FLUSH
10.0000 mL | INTRAVENOUS | Status: DC | PRN
  Administered 2019-04-21: 10 mL via INTRAVENOUS
  Filled 2019-04-21: qty 10

## 2019-04-21 NOTE — Progress Notes (Signed)
All premeds taken at home as directed.

## 2019-04-21 NOTE — Progress Notes (Signed)
No new changes noted today 

## 2019-04-23 ENCOUNTER — Other Ambulatory Visit: Payer: Self-pay | Admitting: Hematology and Oncology

## 2019-04-23 LAB — PROTEIN ELECTROPHORESIS, SERUM
A/G Ratio: 1.8 — ABNORMAL HIGH (ref 0.7–1.7)
Albumin ELP: 3.9 g/dL (ref 2.9–4.4)
Alpha-1-Globulin: 0.2 g/dL (ref 0.0–0.4)
Alpha-2-Globulin: 0.9 g/dL (ref 0.4–1.0)
Beta Globulin: 0.9 g/dL (ref 0.7–1.3)
Gamma Globulin: 0.2 g/dL — ABNORMAL LOW (ref 0.4–1.8)
Globulin, Total: 2.2 g/dL (ref 2.2–3.9)
M-Spike, %: 0.1 g/dL — ABNORMAL HIGH
Total Protein ELP: 6.1 g/dL (ref 6.0–8.5)

## 2019-04-24 ENCOUNTER — Telehealth: Payer: Self-pay

## 2019-04-24 NOTE — Telephone Encounter (Signed)
-----   Message from Lequita Asal, MD sent at 04/24/2019  1:45 PM EDT ----- Regarding: Please call patient with M-spike  Stable at 0.1  M ----- Message ----- From: Interface, Lab In [place] Sent: 04/21/2019  10:13 AM EDT To: Lequita Asal, MD

## 2019-04-24 NOTE — Telephone Encounter (Signed)
Informed patient of M-Spike results. Patient verbalizes understanding and denies any further questions or concerns.

## 2019-04-24 NOTE — Telephone Encounter (Signed)
Lab results has been released into the patient mychart.

## 2019-05-02 ENCOUNTER — Other Ambulatory Visit: Payer: Self-pay

## 2019-05-02 DIAGNOSIS — N138 Other obstructive and reflux uropathy: Secondary | ICD-10-CM

## 2019-05-05 ENCOUNTER — Other Ambulatory Visit: Payer: Medicare Other

## 2019-05-05 ENCOUNTER — Other Ambulatory Visit: Payer: Self-pay

## 2019-05-05 DIAGNOSIS — N401 Enlarged prostate with lower urinary tract symptoms: Secondary | ICD-10-CM

## 2019-05-05 DIAGNOSIS — N138 Other obstructive and reflux uropathy: Secondary | ICD-10-CM

## 2019-05-06 LAB — PSA: Prostate Specific Ag, Serum: 1.1 ng/mL (ref 0.0–4.0)

## 2019-05-06 NOTE — Progress Notes (Signed)
05/07/2019 8:48 AM   Logan Perez Jun 01, 1946 [id number]  Referring provider: Sofie Hartigan, MD [place],  [place] [address]  Chief Complaint  Patient presents with  . Benign Prostatic Hypertrophy    HPI: Patient is a 73 year old male who presents today for a one year follow up for BPH with LUTS.    BPH WITH LUTS His IPSS score today is 5, which is mild lower urinary tract symptomatology.  He is pleased with his quality life due to his urinary symptoms.   His previous I PSS score was 4/0.  His main urinary complaints today is a weak stream.  He denies any dysuria, hematuria or suprapubic pain.  He currently taking tamsulosin 0.4 mg daily.   He also denies any recent fevers, chills, nausea or vomiting.   IPSS    Row Name 05/07/19 0800         International Prostate Symptom Score   How often have you had the sensation of not emptying your bladder?  Not at All     How often have you had to urinate less than every two hours?  Not at All     How often have you found you stopped and started again several times when you urinated?  Not at All     How often have you found it difficult to postpone urination?  Not at All     How often have you had a weak urinary stream?  More than half the time     How often have you had to strain to start urination?  Not at All     How many times did you typically get up at night to urinate?  1 Time     Total IPSS Score  5       Quality of Life due to urinary symptoms   If you were to spend the rest of your life with your urinary condition just the way it is now how would you feel about that?  Pleased        Score:  1-7 Mild 8-19 Moderate 20-35 Severe  PMH: Past Medical History:  Diagnosis Date  . Anxiety   . Atrial fibrillation ([place])   . BPH (benign prostatic hyperplasia)   . Complication of anesthesia    BAD HEADACHE NIGHT OF FIRST CATARACT  . Compression fracture of lumbar vertebra ([place])   . Difficulty  voiding   . Dysrhythmia    A FIB  . Elevated PSA   . GERD (gastroesophageal reflux disease)   . Hearing aid worn    bilateral  . History of kidney stones   . HLD (hyperlipidemia)   . HOH (hard of hearing)   . Hypertension   . Multiple myeloma ([place])   . Neuropathy    feet. 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  . Pain    BACK  . Palpitations   . Pneumonia   . Pulmonary embolism ([place])   . Sepsis ([place])   . Stroke [facility])    TIA, detected on CT scan. pt was unaware    Surgical History: Past Surgical History:  Procedure Laterality Date  . BACK SURGERY  1994  . CATARACT EXTRACTION W/PHACO Right 05/02/2016   Procedure: CATARACT EXTRACTION PHACO AND INTRAOCULAR LENS PLACEMENT (IOC);  Surgeon: Birder Robson, MD;  Location: [facility];  Service: Ophthalmology;  Laterality: Right;  [place] 1.06 AP% 20.6 CDE 13.70 FLUID PACK LOT # [id number] H  . CATARACT EXTRACTION W/PHACO Left  05/16/2016   Procedure: CATARACT EXTRACTION PHACO AND INTRAOCULAR LENS PLACEMENT (IOC);  Surgeon: Birder Robson, MD;  Location: [facility];  Service: Ophthalmology;  Laterality: Left;  [place] 01:43 AP% 19.8 CDE 20.45 FLUID PACK LOT #[id number] H  . COLONOSCOPY WITH PROPOFOL N/A 03/22/2017   Procedure: COLONOSCOPY WITH PROPOFOL;  Surgeon: Lucilla Lame, MD;  Location: [facility];  Service: Endoscopy;  Laterality: N/A;  has port  . ESOPHAGOGASTRODUODENOSCOPY (EGD) WITH PROPOFOL  03/22/2017   Procedure: ESOPHAGOGASTRODUODENOSCOPY (EGD) WITH PROPOFOL;  Surgeon: Lucilla Lame, MD;  Location: [facility];  Service: Endoscopy;;  . EYE SURGERY    . KNEE ARTHROSCOPY Left 1992  . LIMBAL STEM CELL TRANSPLANT    . PAIN PUMP IMPLANTATION  2012  . PAIN PUMP IMPLANTATION N/A 09/04/2017   Procedure: INTRATHECAL PUMP BATTERY CHANGE;  Surgeon: Milinda Pointer, MD;  Location: [facility];  Service: Neurosurgery;  Laterality: N/A;  . PORTA CATH INSERTION N/A 12/04/2016   Procedure: Glori Luis Cath Insertion;  Surgeon: Algernon Huxley, MD;  Location: [facility];  Service: Cardiovascular;  Laterality: N/A;  . stem cell implant  2008   [facility]    Home Medications:  Allergies as of 05/07/2019      Reactions   Azithromycin Diarrhea, Other (See Comments)   Possible cause of C. Diff Possible cause of C. Diff Possible cause of C. Diff Possible cause of C. 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 If you have any questions, ask your nurse or doctor.        acetaminophen 500 MG tablet Commonly known as: TYLENOL Take 500 mg by mouth 2 (two) times a day.   ALPRAZolam 0.25 MG tablet Commonly known as: XANAX TAKE 1 TABLET BY MOUTH EVERY NIGHT AT BEDTIME AS NEEDED   ARTIFICIAL TEARS OP Place 1 drop as needed into both eyes (for dry eyes).   baclofen 10 MG tablet Commonly known as: LIORESAL Take 1 tablet (10 mg total) by mouth 2 (two) times daily.   bismuth subsalicylate 638 GT/36IW suspension Commonly known as: PEPTO BISMOL Take 30 mLs by mouth every 6 (six) hours as needed for diarrhea or loose stools.   Calcium-Vitamin D 500-400 MG-UNIT Tabs Take 1 tablet daily by mouth.   cetirizine 10 MG tablet Commonly known as: ZYRTEC Take 10 mg by mouth daily as needed for allergies.   DARZALEX IV Inject 1,200 mg every 30 (thirty) days into the vein.   dexamethasone 4 MG tablet Commonly known as: DECADRON TAKE 4 TABLETS BY MOUTH 1 HOUR PRIOR TO INFUSION, THEN 1 TABLET ON DAYS 2 AND 3 AS DIRECTED   diltiazem 120 MG 24 hr capsule Commonly known as: CARDIZEM CD Take 120 mg daily by mouth.   diltiazem 60 MG tablet Commonly known as: CARDIZEM Take 60 mg daily as needed by mouth (for increased heart rate > 140).   diphenhydrAMINE 25 MG  tablet Commonly known as: BENADRYL Take 25 mg by mouth as directed. With chemo treatment   Eliquis 5 MG Tabs tablet Generic drug: apixaban TAKE 1 TABLET BY MOUTH TWICE DAILY   fluticasone 50 MCG/ACT nasal spray  Commonly known as: FLONASE Place 1 spray into the nose daily as needed for allergies.   loperamide 2 MG capsule Commonly known as: IMODIUM Take 4 mg as needed by mouth for diarrhea or loose stools.   loratadine 10 MG tablet Commonly known as: CLARITIN Take 10 mg by mouth daily as needed.   lovastatin 20 MG tablet Commonly known as: MEVACOR Take 20 mg by mouth every evening.   montelukast 10 MG tablet Commonly known as: SINGULAIR TAKE 1 TABLET BY MOUTH DAILY THE DAY BEFORE THE INFUSION, THEN DAY OF AND THE 2 DAYS AFTER INFUSION   MULTIVITAMIN/IRON PO Take 1 tablet daily by mouth.   NON FORMULARY IT pump  Fentanyl 1,500.0 mcg/ml Bupivicaine 30.0 mg/ml Clonidine 300.0 mcg/ml Rate 502.2 mcg/day   omeprazole 20 MG capsule Commonly known as: PRILOSEC Take 20 mg by mouth daily.   ondansetron 8 MG tablet Commonly known as: ZOFRAN Take 1 tablet (8 mg total) by mouth as directed. Take with chemo and can take it as needed   PAIN MANAGEMENT IT PUMP REFILL 1 each by Intrathecal route once for 1 dose. Medication: PF Fentanyl 1,500.0 mcg/ml PF Bupivicaine 30.0 mg/ml PF Clonidine 300.0 mcg/ml Total Volume: 40 ml Needed by 12-10-2018 @ 1000   PAIN MANAGEMENT IT PUMP REFILL 1 each by Intrathecal route once for 1 dose. Medication: PF Fentanyl 1,500.0 mcg/ml PF Bupivicaine 30.0 mg/ml PF Clonidine 300.mcg/ml Total Volume: 3m Needed by 03/25/2019 '@1000'    potassium chloride SA 20 MEQ tablet Commonly known as: K-DUR TAKE 1 TABLET BY MOUTH DAILY   tamsulosin 0.4 MG Caps capsule Commonly known as: FLOMAX Take 1 capsule (0.4 mg total) by mouth daily.   valACYclovir 500 MG tablet Commonly known as: VALTREX TAKE 1 TABLET BY MOUTH DAILY   vitamin B-12 1000 MCG tablet  Commonly known as: CYANOCOBALAMIN Take 1,000 mcg daily by mouth.   Vitamin D3 50 MCG (2000 UT) capsule Take 2,000 Units daily by mouth.       Allergies:  Allergies  Allergen Reactions  . Azithromycin Diarrhea and Other (See Comments)    Possible cause of C. Diff Possible cause of C. Diff Possible cause of C. Diff Possible cause of C. Diff  . Bee Pollen Other (See Comments)    Sneezing, watery eyes, runny nose  . Pollen Extract Other (See Comments)    Sneezing, watery eyes, runny nose  . 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 Rivaroxaban Rash    Family History: Family History  Problem Relation Age of Onset  . Cancer Father        throat  . Kidney disease Sister   . Stroke Other   . Stroke Mother   . Bladder Cancer Neg Hx   . Prostate cancer Neg Hx   . Kidney cancer Neg Hx     Social History:  reports that he quit smoking about 43 years ago. His smoking use included cigarettes. He has a 30.00 pack-year smoking history. He has never used smokeless tobacco. He reports that he does not drink alcohol or use drugs.  ROS: UROLOGY Frequent Urination?: No Hard to postpone urination?: No Burning/pain with urination?: No Get up at night to urinate?: No Leakage of urine?: No Urine stream starts and stops?: No Trouble starting stream?: No Do you have to strain to urinate?: No Blood in urine?: No Urinary tract infection?: No Sexually transmitted disease?: No Injury to kidneys or bladder?:  No Painful intercourse?: No Weak stream?: Yes Erection problems?: No Penile pain?: No  Gastrointestinal Nausea?: No Vomiting?: No Indigestion/heartburn?: No Diarrhea?: Yes Constipation?: No  Constitutional Fever: No Night sweats?: No Weight loss?: No Fatigue?: No  Skin Skin rash/lesions?: No Itching?: No  Eyes Blurred vision?: No Double vision?: No  Ears/Nose/Throat Sore throat?: No Sinus problems?: No   Hematologic/Lymphatic Swollen glands?: No Easy bruising?: No  Cardiovascular Leg swelling?: No Chest pain?: No  Respiratory Cough?: No Shortness of breath?: No  Endocrine Excessive thirst?: No  Musculoskeletal Back pain?: Yes Joint pain?: No  Neurological Headaches?: No Dizziness?: No  Psychologic Depression?: No Anxiety?: No  Physical Exam: BP 108/73   Pulse (!) 118   Ht '5\' 7"'  (1.702 m)   Wt 169 lb (76.7 kg)   BMI 26.47 kg/m   Constitutional:  Well nourished. Alert and oriented, No acute distress. HEENT: [place] AT, moist mucus membranes.  Trachea midline, no masses. Cardiovascular: No clubbing, cyanosis, or edema. Respiratory: Normal respiratory effort, no increased work of breathing. GI: Abdomen is soft, non tender, non distended, no abdominal masses. Liver and spleen not palpable.  No hernias appreciated.  Stool sample for occult testing is not indicated.   GU: No CVA tenderness.  No bladder fullness or masses.  Patient with circumcised phallus.  Urethral meatus is patent.  No penile discharge. No penile lesions or rashes. Scrotum without lesions, cysts, rashes and/or edema.  Testicles are located scrotally bilaterally. No masses are appreciated in the testicles. Left and right epididymis are normal. Rectal: Patient with  normal sphincter tone. Anus and perineum without scarring or rashes. No rectal masses are appreciated. Prostate is approximately 45 grams, no nodules are appreciated. Seminal vesicles are normal. Skin: No rashes, bruises or suspicious lesions. Lymph: No inguinal adenopathy. Neurologic: Grossly intact, no focal deficits, moving all 4 extremities. Psychiatric: Normal mood and affect.   Laboratory Data: Lab Results  Component Value Date   WBC 8.0 04/21/2019   HGB 12.9 (L) 04/21/2019   HCT 36.5 (L) 04/21/2019   MCV 102.8 (H) 04/21/2019   PLT 198 04/21/2019    Lab Results  Component Value Date   CREATININE 1.52 (H) 04/21/2019    PSA  History: 2.9 ng/mL on 04/30/2012 1.9 ng/mL on 05/01/2013 1.5 ng/mL on 04/30/2014 0.9 ng/mL in 04/2018 1.1 ng/mL in 04/2019   Lab Results  Component Value Date   AST 21 04/21/2019   Lab Results  Component Value Date   ALT 15 04/21/2019   I have reviewed the labs   Assessment & Plan:    1. BPH with LUTS IPSS score is 5/1, it is slightly worse Continue conservative management, avoiding bladder irritants Most bothersome symptom is weak stream  Continue tamsulosin 0.4 mg daily;refills given RTC in 12 months for IPSS and exam   Return in about 1 year (around 05/06/2020) for IPSS, PSA and exam.  These notes generated with voice recognition software. I apologize for typographical errors.  Zara Council, PA-C  [facility] [address] [place], [place] [address] [phone]

## 2019-05-07 ENCOUNTER — Ambulatory Visit (INDEPENDENT_AMBULATORY_CARE_PROVIDER_SITE_OTHER): Payer: Medicare Other | Admitting: Urology

## 2019-05-07 ENCOUNTER — Encounter: Payer: Self-pay | Admitting: Urology

## 2019-05-07 ENCOUNTER — Other Ambulatory Visit: Payer: Self-pay

## 2019-05-07 VITALS — BP 108/73 | HR 118 | Ht 67.0 in | Wt 169.0 lb

## 2019-05-07 DIAGNOSIS — N138 Other obstructive and reflux uropathy: Secondary | ICD-10-CM

## 2019-05-07 DIAGNOSIS — N401 Enlarged prostate with lower urinary tract symptoms: Secondary | ICD-10-CM

## 2019-05-07 MED ORDER — TAMSULOSIN HCL 0.4 MG PO CAPS: 0.4000 mg | ORAL_CAPSULE | Freq: Every day | ORAL | 4 refills | Status: DC

## 2019-05-10 ENCOUNTER — Other Ambulatory Visit: Payer: Self-pay | Admitting: Hematology and Oncology

## 2019-05-10 DIAGNOSIS — C9001 Multiple myeloma in remission: Secondary | ICD-10-CM

## 2019-05-16 ENCOUNTER — Other Ambulatory Visit: Payer: Self-pay

## 2019-05-16 DIAGNOSIS — C9000 Multiple myeloma not having achieved remission: Secondary | ICD-10-CM

## 2019-05-16 NOTE — Progress Notes (Signed)
[facility]  [address] [place], [place] [address] Phone: [phone]  Fax: [phone]   Clinic Day:  05/19/2019  Referring physician: Sofie Hartigan, MD  Chief Complaint: Logan Perez is a 73 y.o. 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  HPI: The patient was last seen in the medical oncology clinic on 04/21/2019. At that time, he was doing well. Bowel movements were stable.  M-spike was 0.1 gm/dL. He received cycle #30daratumumab.   He was seen by SZara Council PA in urology for follow-up of BPH with LUTS. He was stable. PSA was 1.1. Annual follow-up was scheduled.   During the interim, he is doing "great."  His diarrhea have improved since he stopped eating ice cream and started eating yogurt again. He denies any fevers, infections, or bone pain. His chronic back pain is very well managed.    Past Medical History:  Diagnosis Date   Anxiety    Atrial fibrillation (HCC)    BPH (benign prostatic hyperplasia)    Complication of anesthesia    BAD HEADACHE NIGHT OF FIRST CATARACT   Compression fracture of lumbar vertebra (HCC)    Difficulty voiding    Dysrhythmia    A FIB   Elevated PSA    GERD (gastroesophageal reflux disease)    Hearing aid worn    bilateral   History of kidney stones    HLD (hyperlipidemia)    HOH (hard of hearing)    Hypertension    Multiple myeloma (HCC)    Neuropathy    feet. 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   Pain    BACK   Palpitations    Pneumonia    Pulmonary embolism (HCC)    Sepsis (HCC)    Stroke (HCC)    TIA, detected on CT scan. pt was unaware    Past Surgical History:  Procedure Laterality Date   BACK SURGERY  1994   CATARACT EXTRACTION W/PHACO Right 05/02/2016   Procedure: CATARACT EXTRACTION PHACO AND INTRAOCULAR  LENS PLACEMENT (IOC);  Surgeon: WBirder Robson MD;  Location: [facility] OR[facility];  Service: Ophthalmology;  Laterality: Right;  [place] AP% 20.6 CDE 13.70 FLUID PACK LOT # [id number]   CATARACT EXTRACTION W/PHACO Left 05/16/2016   Procedure: CATARACT EXTRACTION PHACO AND INTRAOCULAR LENS PLACEMENT (IOC);  Surgeon: WBirder Robson MD;  Location: [facility] ORS;  Service: Ophthalmology;  Laterality: Left;  [place]01:43 AP% 19.8 CDE 20.45 FLUID PACK LOT ##[id number]   COLONOSCOPY WITH PROPOFOL N/A 03/22/2017   Procedure: COLONOSCOPY WITH PROPOFOL;  Surgeon: WLucilla Lame MD;  [place] [facility]  Service: Endoscopy;  Laterality: N/A;  has port   ESOPHAGOGASTRODUODENOSCOPY (EGD) WITH PROPOFOL  03/22/2017   Procedure: ESOPHAGOGASTRODUODENOSCOPY (EGD) WITH PROPOFOL;  Surgeon: WLucilla Lame MD;  [place] [facility]  Service: Endoscopy;;   EYE SURGERY     KNEE ARTHROSCOPY Left 1992   LIMBAL STEM CELL TRANSPLANT     PAIN PUMP IMPLANTATION  2012   PAIN PUMP IMPLANTATION N/A 09/04/2017   Procedure: INTRATHECAL PUMP BATTERY CHANGE;  Surgeon: NMilinda Pointer MD;  Location: [facility];  Service: Neurosurgery;  Laterality: N/A;   PORTA CATH INSERTION N/A 12/04/2016   Procedure: PGlori LuisCath Insertion;  Surgeon: JAlgernon Huxley MD;  [facility];  Service: Cardiovascular;  Laterality: N/A;   [facility]  Family History  Problem Relation Age of Onset   Cancer Father        throat   Kidney disease Sister    Stroke Other    Stroke Mother    Bladder Cancer Neg Hx    Prostate cancer Neg Hx    Kidney cancer Neg Hx     Social History:  reports that he quit smoking about 43 years ago. His smoking use included cigarettes. He has a 30.00 pack-year smoking history. He has never used smokeless tobacco. He reports that he does not drink alcohol or use drugs. He has a wire haired dachshund. He lives in [place]. His wife's name is [name]. The patient is alone  today.  Allergies:  Allergies  Allergen Reactions   Azithromycin Diarrhea and Other (See Comments)    Possible cause of C. Diff Possible cause of C. Diff Possible cause of C. Diff Possible cause of C. Diff   Bee Pollen Other (See Comments)    Sneezing, watery eyes, runny nose   Pollen Extract Other (See Comments)    Sneezing, watery eyes, runny nose   Zoledronic Acid Other (See Comments)    ONG- Osteonecrosis of the jaw  Osteonecrosis of the jaw   Rivaroxaban Rash    Current Medications: Current Outpatient Medications  Medication Sig Dispense Refill   acetaminophen (TYLENOL) 500 MG tablet Take 500 mg by mouth 2 (two) times a day.     baclofen (LIORESAL) 10 MG tablet Take 1 tablet (10 mg total) by mouth 2 (two) times daily. 180 tablet 3   bismuth subsalicylate (PEPTO BISMOL) 262 MG/15ML suspension Take 30 mLs by mouth every 6 (six) hours as needed for diarrhea or loose stools.     Calcium Carb-Cholecalciferol (CALCIUM-VITAMIN D) 500-400 MG-UNIT TABS Take 1 tablet daily by mouth.     cetirizine (ZYRTEC) 10 MG tablet Take 10 mg by mouth daily as needed for allergies.      Cholecalciferol (VITAMIN D3) 2000 units capsule Take 2,000 Units daily by mouth.      Daratumumab (DARZALEX IV) Inject 1,200 mg every 30 (thirty) days into the vein.      diltiazem (CARDIZEM CD) 120 MG 24 hr capsule Take 120 mg daily by mouth.      ELIQUIS 5 MG TABS tablet TAKE 1 TABLET BY MOUTH TWICE DAILY 60 tablet 0   Hypromellose (ARTIFICIAL TEARS OP) Place 1 drop as needed into both eyes (for dry eyes).     loratadine (CLARITIN) 10 MG tablet Take 10 mg by mouth daily as needed.      lovastatin (MEVACOR) 20 MG tablet Take 20 mg by mouth every evening.      montelukast (SINGULAIR) 10 MG tablet TAKE 1 TABLET BY MOUTH DAILY THE DAY BEFORE THE INFUSION, THEN DAY OF AND THE 2 DAYS AFTER INFUSION 30 tablet 0   Multiple Vitamins-Iron (MULTIVITAMIN/IRON PO) Take 1 tablet daily by mouth.     NON  FORMULARY IT pump  Fentanyl 1,500.0 mcg/ml Bupivicaine 30.0 mg/ml Clonidine 300.0 mcg/ml Rate 502.2 mcg/day     omeprazole (PRILOSEC) 20 MG capsule Take 20 mg by mouth daily.      potassium chloride SA (K-DUR,KLOR-CON) 20 MEQ tablet TAKE 1 TABLET BY MOUTH DAILY 90 tablet 0   tamsulosin (FLOMAX) 0.4 MG CAPS capsule Take 1 capsule (0.4 mg total) by mouth daily. 90 capsule 4   valACYclovir (VALTREX) 500 MG tablet TAKE 1 TABLET BY MOUTH DAILY 30 tablet 3   vitamin B-12 (CYANOCOBALAMIN) 1000 MCG  tablet Take 1,000 mcg daily by mouth.      ALPRAZolam (XANAX) 0.25 MG tablet TAKE 1 TABLET BY MOUTH EVERY NIGHT AT BEDTIME AS NEEDED (Patient not taking: Reported on 05/19/2019) 30 tablet 0   dexamethasone (DECADRON) 4 MG tablet TAKE 4 TABLETS BY MOUTH 1 HOUR PRIOR TO INFUSION, THEN 1 TABLET ON DAYS 2 AND 3 AS DIRECTED (Patient not taking: Reported on 05/19/2019) 30 tablet 0   diltiazem (CARDIZEM) 60 MG tablet Take 60 mg daily as needed by mouth (for increased heart rate > 140).      diphenhydrAMINE (BENADRYL) 25 MG tablet Take 25 mg by mouth as directed. With chemo treatment     fluticasone (FLONASE) 50 MCG/ACT nasal spray Place 1 spray into the nose daily as needed for allergies.      loperamide (IMODIUM) 2 MG capsule Take 4 mg as needed by mouth for diarrhea or loose stools.      ondansetron (ZOFRAN) 8 MG tablet Take 1 tablet (8 mg total) by mouth as directed. Take with chemo and can take it as needed (Patient not taking: Reported on 05/19/2019) 20 tablet 1   PAIN MANAGEMENT IT PUMP REFILL 1 each by Intrathecal route once for 1 dose. Medication: PF Fentanyl 1,500.0 mcg/ml PF Bupivicaine 30.0 mg/ml PF Clonidine 300.0 mcg/ml Total Volume: 40 ml Needed by 12-10-2018 @ 1000 1 each 0   PAIN MANAGEMENT IT PUMP REFILL 1 each by Intrathecal route once for 1 dose. Medication: PF Fentanyl 1,500.0 mcg/ml PF Bupivicaine 30.0 mg/ml PF Clonidine 300.mcg/ml Total Volume: 62m Needed by 03/25/2019 _0  1  each 0   No current facility-administered medications for this visit.    Facility-Administered Medications Ordered in Other Visits  Medication Dose Route Frequency Provider Last Rate Last Dose   heparin lock flush 100 unit/mL  500 Units Intravenous Once Omaria Plunk C, MD       sodium chloride flush (NS) 0.9 % injection 10 mL  10 mL Intravenous PRN CLequita Asal MD   10 mL at 05/19/19 00454   Review of Systems  Constitutional: Negative.  Negative for chills, diaphoresis, fever, malaise/fatigue and weight loss (stable).       Feels "great".  HENT: Negative.  Negative for congestion, ear discharge, ear pain, nosebleeds, sinus pain, sore throat and tinnitus.   Eyes: Negative.  Negative for blurred vision, double vision, photophobia and pain.  Respiratory: Negative.  Negative for cough, hemoptysis, sputum production, shortness of breath and wheezing.   Cardiovascular: Negative.  Negative for chest pain, palpitations, orthopnea, leg swelling and PND.       H/o atrial fibrillation.  Gastrointestinal: Positive for diarrhea (chronic, controlled w/ Imodium, improved). Negative for abdominal pain, blood in stool, constipation, heartburn, melena, nausea and vomiting.  Genitourinary: Negative.  Negative for dysuria, frequency, hematuria and urgency.  Musculoskeletal: Positive for back pain (chronic; Fentanyl/bupivacaine/clonidine pump). Negative for falls, joint pain, myalgias and neck pain.  Skin: Negative.  Negative for itching and rash.  Neurological: Positive for sensory change (neuropathy in toes, stable). Negative for dizziness, tremors, speech change, focal weakness, weakness and headaches.  Endo/Heme/Allergies: Negative.  Negative for environmental allergies. Does not bruise/bleed easily.  Psychiatric/Behavioral: Negative.  Negative for depression and memory loss. The patient is not nervous/anxious and does not have insomnia.   All other systems reviewed and are  negative.  Performance status (ECOG): 1  Vitals Blood pressure 111/75, pulse 89, temperature (!) 96.8 F (36 C), temperature source Tympanic, resp. rate 18, height _1  (  1.702 m), weight 169 lb 6.8 oz (76.9 kg), SpO2 100 %.   Physical Exam  Constitutional: He is oriented to person, place, and time. He appears well-developed and well-nourished. No distress.  HENT:  Head: Normocephalic and atraumatic.  Mouth/Throat: Oropharynx is clear and moist. No oropharyngeal exudate.  Dark hair with graying. Goatee.  Wearing a mask.   Eyes: Pupils are equal, round, and reactive to light. EOM are normal. No scleral icterus.  Glasses. Blue eyes.  Neck: Normal range of motion. Neck supple. No JVD present.  Cardiovascular: Normal rate, regular rhythm and normal heart sounds. Exam reveals no gallop.  No murmur heard. Pulmonary/Chest: Effort normal and breath sounds normal. No respiratory distress. He has no wheezes. He has no rales.  Abdominal: Soft. Bowel sounds are normal. He exhibits no distension and no mass. There is no abdominal tenderness. There is no rebound and no guarding.  Musculoskeletal: Normal range of motion.        General: No tenderness or edema.     Comments: Right sided implanted Fentanyl/bupivacaine/clonidine pump.   Lymphadenopathy:    He has no cervical adenopathy.    He has no axillary adenopathy.       Right: No supraclavicular adenopathy present.       Left: No supraclavicular adenopathy present.  Neurological: He is alert and oriented to person, place, and time.  Skin: Skin is warm and dry. No rash noted. He is not diaphoretic. No erythema. No pallor.  Psychiatric: He has a normal mood and affect. His behavior is normal. Judgment and thought content normal.  Nursing note and vitals reviewed.   Infusion on 05/19/2019  Component Date Value Ref Range Status   Magnesium 05/19/2019 1.8  1.7 - 2.4 mg/dL Final   Performed at [facility], [address],  [place], [place] [address]   WBC 05/19/2019 7.5  4.0 - 10.5 K/uL Final   RBC 05/19/2019 3.59* 4.22 - 5.81 MIL/uL Final   Hemoglobin 05/19/2019 12.7* 13.0 - 17.0 g/dL Final   HCT 05/19/2019 36.1* 39.0 - 52.0 % Final   MCV 05/19/2019 100.6* 80.0 - 100.0 fL Final   MCH 05/19/2019 35.4* 26.0 - 34.0 pg Final   MCHC 05/19/2019 35.2  30.0 - 36.0 g/dL Final   RDW 05/19/2019 13.3  11.5 - 15.5 % Final   Platelets 05/19/2019 205  150 - 400 K/uL Final   nRBC 05/19/2019 0.0  0.0 - 0.2 % Final   Neutrophils Relative % 05/19/2019 56  % Final   Neutro Abs 05/19/2019 4.2  1.7 - 7.7 K/uL Final   Lymphocytes Relative 05/19/2019 32  % Final   Lymphs Abs 05/19/2019 2.4  0.7 - 4.0 K/uL Final   Monocytes Relative 05/19/2019 10  % Final   Monocytes Absolute 05/19/2019 0.7  0.1 - 1.0 K/uL Final   Eosinophils Relative 05/19/2019 2  % Final   Eosinophils Absolute 05/19/2019 0.2  0.0 - 0.5 K/uL Final   Basophils Relative 05/19/2019 0  % Final   Basophils Absolute 05/19/2019 0.0  0.0 - 0.1 K/uL Final   Immature Granulocytes 05/19/2019 0  % Final   Abs Immature Granulocytes 05/19/2019 0.02  0.00 - 0.07 K/uL Final   Performed at [facility], [address] [place], [place] [address]   Sodium 05/19/2019 136  135 - 145 mmol/L Final   Potassium 05/19/2019 3.9  3.5 - 5.1 mmol/L Final   Chloride 05/19/2019 105  98 - 111 mmol/L Final   CO2  05/19/2019 23  22 - 32 mmol/L Final   Glucose, Bld 05/19/2019 127* 70 - 99 mg/dL Final   BUN 05/19/2019 16  8 - 23 mg/dL Final   Creatinine, Ser 05/19/2019 1.38* 0.61 - 1.24 mg/dL Final   Calcium 05/19/2019 9.0  8.9 - 10.3 mg/dL Final   Total Protein 05/19/2019 6.0* 6.5 - 8.1 g/dL Final   Albumin 05/19/2019 3.9  3.5 - 5.0 g/dL Final   AST 05/19/2019 18  15 - 41 U/L Final   ALT 05/19/2019 14  0 - 44 U/L Final   Alkaline Phosphatase 05/19/2019 39  38 - 126 U/L Final   Total Bilirubin 05/19/2019 0.9  0.3 - 1.2 mg/dL Final   GFR calc non Af  Amer 05/19/2019 50* >60 mL/min Final   GFR calc Af Amer 05/19/2019 58* >60 mL/min Final   Anion gap 05/19/2019 8  5 - 15 Final   Performed at [facility], [address] [place], [place] [address]    Assessment:  Logan Perez is a 72 y.o. male with stage III mutiple myeloma. He initially presented with progressive back pain beginning in 12/2006. MRI revealed "spots and compression fractures". He began Velcade, thalidomide, and Decadron. In 08/2007, he underwent high dose chemotherapy and autologous stem cell transplant. 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  He recurred with a rising M-spike (2.7) with repeat M spike (1.7 gm/dl) in 03/2010. He was initially treated with Velcade (02/08/2010 - 05/10/2010). He then began Revlimid (15 mg 3 weeks on/1 week off) and Decadron (40 mg on day 1, 8, 15, 22). Because of significant side effect with Decadron his dose was decreased to 10 mg once a week in 07/2010.   He was on maintenance Revlimid. Revlimid was initially10 mg 3 weeks on/1 week off. This was changed to 10 mg 2 weeks on/2 weeks off secondary to right nipple tenderness. His dose was increased to 10 mg 3 weeks on/1 week off with Decadron 10 mg a week (on Sundays) and then Revlamid 15 mg 3 weeks on and 1 week off with Decadron on Sundays. He began Pomalyst4 mg 3 weeks on/1 week off with Decadron on 08/27/2015.  Over the past year his SPEPhas revealed no monoclonal protein (04/21/2015) and 0.5 gm/dL on 09/22/2015 and 10/20/2015. M spikewas 0.1 on 02/02/2016, 03/01/2016, 03/29/2016, 04/26/2016, and 05/24/2016. M spikewas 0 on 10/11/2016, 11/28/2016, 02/05/2017, 03/21/2017, 08/02/2017, 10/04/2017, 12/27/2017, 01/24/2018, and 02/21/2018. M spikewas 0.1 on 11/01/2017, 0.1 on 11/29/2017, 0 on 12/27/2017, 0 on 01/24/2018, 0 on 02/21/2018, 0.1 on 03/21/2018, 0.3 on 04/18/2018, 0.1 on 05/16/2018, 0 on 06/13/2018, 0.2 on 08/08/2018, 0 on  09/09/2018, 0.1 on 11/04/2018, 0 on 12/02/2018, 0.1 on 12/30/2018, 0 on 01/27/2019, 0.1 on 02/24/2019, 0.1 on 03/24/2019, 0.1 on 04/23/2019, and 0 on 05/19/2019.  Free light chainshave been monitored. Kappa free light chainswere 18.54 on 11/21/2013, 18.37 on 02/20/2014, 18.93 on 05/22/2014, 32.58 (high; normal ratio 1.27) on 08/21/2014, 51.53 (high; elevated ratio of 2.12) on 11/13/2014, 28.08 (ratio 1.73) on 12/09/2014, 23.71 (ratio 2.17) on 01/18/2015, 92.93 (ratio 9.49) on 04/21/2015, 93.44 (ratio 10.28) on 05/26/2015, 255.45 (ratio of 24.05) on 07/14/2015, 373.89 (ratio 48.31) on 08/04/2015, 474.33 (ratio 70.58) on 08/25/2015, 450.76 (ratio 55.44) on 09/22/2015, 453.4 (ratio 59.89) on 10/20/2015, 58.26 (ratio of >40.74) on 02/02/2016, 62.67 (ratio of 37.98) on 03/01/2016, 75.7 (ratio >50.47) on 03/29/2016, 79.7 (ratio 53.13) on 04/26/2016, 108.6 (ratio >72.4) on 05/24/2016, 8.3 (1.46 ratio) on 10/11/2016, 6.7 (0.81 ratio) on 02/05/2017, 7.8 (1.01  ratio) on 03/21/2017,7.6 (ratio 0.63) on 06/01/2017, 8.1 (ratio 1.13) on 11/29/2017, 9.6 (ratio 1.55) on 06/13/2018, 6.6 (ratio 2.06) on 07/11/2018, 6.9 (ratio 0.82) on 08/08/2018, 5.8 (ratio 1.66) on 09/09/2018, 5.8 (ratio 1.05) on 11/04/2018, 5.6(ratio 2.55)on 12/02/2018, 5.5 (ratio 1.96) on 01/27/2019.  Bone surveyon 12/08/2014 was stable. Bone survey on 10/21/2015 revealed increase conspicuity of subcentimeter lytic lesions in the calvarium.  Bone marrow aspirate and biopsyon 11/04/2015 revealed an atypical monoclonal plasma cells estimated at 30-40% of marrow cells. Marrow was variably cellular (approximately 45%) with background trilineage hematopoiesis. There was no significant increase in marrow reticulin fibers. Storage iron was present.   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 He develoed herpes zoster in 04/2008. He developed a pulmonary embolismin 05/2013. He was  initially on Xarelto, but is now on Eliquis. He had an episode of pneumonia around this time requiring a brief admission. He developed severe lower leg cramps on 08/07/2014 secondary to hypokalemia. Duplex was negative.   He was treated forC difficile colitis(Flagyl completed 07/30/2015). He has a chronic indwelling pain pump.  He received 4 cycles of Pomalyst and Decadron(08/27/2015 - 11/19/2015). Restaging studies document progressive disease. Kappa free light chains are increasing. SPEP revealed 0.5 gm/dL monoclonal protein then 1.3 gm/dL. Bone survey reveals increase conspicuity of subcentimeter lytic lesions in the calvarium. Bone marrow reveals 30-40% plasma cells.   MUGAon 11/30/2015 revealed an ejection fraction of 46%. He is felt not to be a good candidate for Kyprolis. There were no focal wall motion abnormalities. He had a stress echo less than 1 year ago. He has a history of PVCs and atrial fibrillation. He takes Cardizem prn.  He received 17 weeks of daratumumab(Darzalex) (12/09/2015 - 05/25/2016). He tolerated treatment well without side effect.  Bone marrow on 02/09/2016 revealed no diagnostic morphologic evidence of plasma cell myeloma. Marrow was normocellular to hypocellular marrow for age (ranging from 10-40%) with maturing trilineage hematopoiesis and mild multilineage dyspoiesis. There was patchy mild increase in reticulin. Storage iron was present. Flow cytometry revealed no definitive evidence of monoclonality. There was a non-specific atypical myeloid and monocytic findings with no increase in blasts. Cytogenetics were normal (46, XY).  He is currently83month s/p 2nd autologous stem cell transplanton 06/16/2016. 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 He also experienced atrial fibrillation with intermittent episodes of RVR requiring IV beta blockers. He is on prophylactic valacyclovir for  1 year post transplant.  He started vaccinations(DTaP-Pediatric triple vaccine, Hep B- Pediatrix triple vaccine, Haemophilus influenza B (Hib), inactivated polio virus (IPV), pneumococcal conjugate vaccine 13-valent (PCV 13)) on 04/17/2017. Recommendation was for [place]rix vaccine [place] dose of [place]rix in 2 months. He had follow-up vaccinations on 08/28/2017. He had his second shingles vaccine. He received 18 month vaccinations- DTaP-Pediatric triple vaccine, Hep B- Pediatrix triple vaccine, Haemophilus influenza B (Hib), inactivated polio virus (IPV), pneumococcal conjugate vaccine 13-valent (PCV 13) on 02/05/2018.  He is s/p 30cycles ofdaratumumab(Darzalex) post transplant(12/05/2016 - 04/21/2019).  PET scanon 08/14/2018 revealed no evidence of active myeloma on whole-body FDG PET scan. There was no evidence soft tissue plasmacytoma. There was no clear evidence of lytic lesions on the CT portion exam. Some lucencies in the pelvis were stable. There were chronic compression fractures in the lower thoracic spine.  He has a history of hypomagnesemiathat required IV magnesium (2-4 gm) weekly. He has not required magnesium since 10/24/2016. Patient has atrial fibrillationand is  on Eliquis.  Symptomatically, he continues to do well.  Chronic diarrhea is well managed with adjustment in his diet (milk products).  Exam is unremarkable.  M spike is 0.  Plan: 1.  Labs today:  CBC with diff, CMP, Mg, SPEP. 2.Multiple myeloma Clinically, he continues to do well.   M spike has fluctuated over the past several months between 0 and 0.2 gm/dL. M spike is 0 today. Labs reviewed.  Blood counts and electrolytes stable for treatment. Cycle #31 daratumumab today.  Patient took his premeds prior to clinic.    Continue Valacyclovir prophylaxis until 3 months status post daratumumab. Patient is no longer followed at the transplant center.             Periodic  phone follow-up with Dr. Adriana Simas at [facility].  Discuss symptom management.  He has antiemetics and pain medications at home to use on a prn bases.  Interventions are adequate.    3. Macrocytic anemia Hematocrit 36.1.  Hemoglobin12.7. MCV100.6. B12, folate and TSH were normal in 01/27/2019. Continue to monitor. 4.Neuropathy Patient has a stable grade 1 neuropathy.  No intervention needed. 5.Chronic diarrhea Patient's diarrhea is well managed. Milk products precipitate diarrhea (ice cream and Boost). Continue to monitor. 6.   History of pulmonary embolism (2014)             Continue Eliquis. 7.   RTC in 4 weeks for MD assessment, labs (CBC with differential, CMP, magnesium, SPEP, FLCA) and cycle #32 daratumumab.  I discussed the assessment and treatment plan with the patient.  The patient was provided an opportunity to ask questions and all were answered.  The patient agreed with the plan and demonstrated an understanding of the instructions.  The patient was advised to call back if the symptoms worsen or if the condition fails to improve as anticipated.   Lequita Asal, MD, PhD    05/19/2019, 9:52 AM  I, Cloyde Reams Dorshimer, am acting as Education administrator for [employer]. Mike Gip, MD, PhD.  I, Skyelar Swigart C. Mike Gip, MD, have reviewed the above documentation for accuracy and completeness, and I agree with the above.

## 2019-05-19 ENCOUNTER — Inpatient Hospital Stay: Payer: Medicare Other

## 2019-05-19 ENCOUNTER — Encounter: Payer: Self-pay | Admitting: Hematology and Oncology

## 2019-05-19 ENCOUNTER — Inpatient Hospital Stay: Payer: Medicare Other | Attending: Hematology and Oncology | Admitting: Hematology and Oncology

## 2019-05-19 ENCOUNTER — Ambulatory Visit: Payer: Medicare Other

## 2019-05-19 ENCOUNTER — Other Ambulatory Visit: Payer: Self-pay

## 2019-05-19 VITALS — BP 111/75 | HR 89 | Temp 96.8°F | Resp 18 | Ht 67.0 in | Wt 169.4 lb

## 2019-05-19 DIAGNOSIS — Z5112 Encounter for antineoplastic immunotherapy: Secondary | ICD-10-CM | POA: Diagnosis present

## 2019-05-19 DIAGNOSIS — Z86711 Personal history of pulmonary embolism: Secondary | ICD-10-CM | POA: Diagnosis not present

## 2019-05-19 DIAGNOSIS — I4891 Unspecified atrial fibrillation: Secondary | ICD-10-CM | POA: Insufficient documentation

## 2019-05-19 DIAGNOSIS — Z87891 Personal history of nicotine dependence: Secondary | ICD-10-CM | POA: Insufficient documentation

## 2019-05-19 DIAGNOSIS — Z9484 Stem cells transplant status: Secondary | ICD-10-CM | POA: Diagnosis not present

## 2019-05-19 DIAGNOSIS — C9000 Multiple myeloma not having achieved remission: Secondary | ICD-10-CM | POA: Diagnosis not present

## 2019-05-19 DIAGNOSIS — Z7901 Long term (current) use of anticoagulants: Secondary | ICD-10-CM | POA: Diagnosis not present

## 2019-05-19 DIAGNOSIS — Z8673 Personal history of transient ischemic attack (TIA), and cerebral infarction without residual deficits: Secondary | ICD-10-CM | POA: Insufficient documentation

## 2019-05-19 DIAGNOSIS — D539 Nutritional anemia, unspecified: Secondary | ICD-10-CM | POA: Diagnosis not present

## 2019-05-19 DIAGNOSIS — Z79899 Other long term (current) drug therapy: Secondary | ICD-10-CM | POA: Diagnosis not present

## 2019-05-19 DIAGNOSIS — C9002 Multiple myeloma in relapse: Secondary | ICD-10-CM

## 2019-05-19 LAB — COMPREHENSIVE METABOLIC PANEL
ALT: 14 U/L (ref 0–44)
AST: 18 U/L (ref 15–41)
Albumin: 3.9 g/dL (ref 3.5–5.0)
Alkaline Phosphatase: 39 U/L (ref 38–126)
Anion gap: 8 (ref 5–15)
BUN: 16 mg/dL (ref 8–23)
CO2: 23 mmol/L (ref 22–32)
Calcium: 9 mg/dL (ref 8.9–10.3)
Chloride: 105 mmol/L (ref 98–111)
Creatinine, Ser: 1.38 mg/dL — ABNORMAL HIGH (ref 0.61–1.24)
GFR calc Af Amer: 58 mL/min — ABNORMAL LOW (ref >60)
GFR calc non Af Amer: 50 mL/min — ABNORMAL LOW (ref >60)
Glucose, Bld: 127 mg/dL — ABNORMAL HIGH (ref 70–99)
Potassium: 3.9 mmol/L (ref 3.5–5.1)
Sodium: 136 mmol/L (ref 135–145)
Total Bilirubin: 0.9 mg/dL (ref 0.3–1.2)
Total Protein: 6 g/dL — ABNORMAL LOW (ref 6.5–8.1)

## 2019-05-19 LAB — CBC WITH DIFFERENTIAL/PLATELET
Abs Immature Granulocytes: 0.02 10*3/uL (ref 0.00–0.07)
Basophils Absolute: 0 10*3/uL (ref 0.0–0.1)
Basophils Relative: 0 %
Eosinophils Absolute: 0.2 10*3/uL (ref 0.0–0.5)
Eosinophils Relative: 2 %
HCT: 36.1 % — ABNORMAL LOW (ref 39.0–52.0)
Hemoglobin: 12.7 g/dL — ABNORMAL LOW (ref 13.0–17.0)
Immature Granulocytes: 0 %
Lymphocytes Relative: 32 %
Lymphs Abs: 2.4 10*3/uL (ref 0.7–4.0)
MCH: 35.4 pg — ABNORMAL HIGH (ref 26.0–34.0)
MCHC: 35.2 g/dL (ref 30.0–36.0)
MCV: 100.6 fL — ABNORMAL HIGH (ref 80.0–100.0)
Monocytes Absolute: 0.7 10*3/uL (ref 0.1–1.0)
Monocytes Relative: 10 %
Neutro Abs: 4.2 10*3/uL (ref 1.7–7.7)
Neutrophils Relative %: 56 %
Platelets: 205 10*3/uL (ref 150–400)
RBC: 3.59 MIL/uL — ABNORMAL LOW (ref 4.22–5.81)
RDW: 13.3 % (ref 11.5–15.5)
WBC: 7.5 10*3/uL (ref 4.0–10.5)
nRBC: 0 % (ref 0.0–0.2)

## 2019-05-19 LAB — MAGNESIUM: Magnesium: 1.8 mg/dL (ref 1.7–2.4)

## 2019-05-19 MED ORDER — HEPARIN SOD (PORK) LOCK FLUSH 100 UNIT/ML IV SOLN
500.0000 [IU] | Freq: Once | INTRAVENOUS | Status: AC
  Administered 2019-05-19: 12:00:00 500 [IU] via INTRAVENOUS

## 2019-05-19 MED ORDER — SODIUM CHLORIDE 0.9 % IV SOLN
1200.0000 mg | Freq: Once | INTRAVENOUS | Status: DC
  Filled 2019-05-19: qty 60

## 2019-05-19 MED ORDER — SODIUM CHLORIDE 0.9 % IV SOLN
Freq: Once | INTRAVENOUS | Status: AC
  Administered 2019-05-19: 11:00:00 via INTRAVENOUS
  Filled 2019-05-19: qty 250

## 2019-05-19 MED ORDER — SODIUM CHLORIDE 0.9 % IV SOLN
1200.0000 mg | Freq: Once | INTRAVENOUS | Status: AC
  Administered 2019-05-19: 1200 mg via INTRAVENOUS
  Filled 2019-05-19: qty 60

## 2019-05-19 MED ORDER — SODIUM CHLORIDE 0.9 % IV SOLN
Freq: Once | INTRAVENOUS | Status: DC
  Filled 2019-05-19: qty 250

## 2019-05-19 MED ORDER — SODIUM CHLORIDE 0.9 % IV SOLN: 1200.0000 mg | Freq: Once | INTRAVENOUS | Status: DC

## 2019-05-19 MED ORDER — SODIUM CHLORIDE 0.9% FLUSH
10.0000 mL | INTRAVENOUS | Status: DC | PRN
  Administered 2019-05-19: 10 mL via INTRAVENOUS
  Filled 2019-05-19: qty 10

## 2019-05-19 NOTE — Progress Notes (Signed)
No new changes noted today 

## 2019-05-19 NOTE — Progress Notes (Signed)
Verified that patient took all of his pre medications as ordered this am prior to treatment.

## 2019-05-20 LAB — PROTEIN ELECTROPHORESIS, SERUM
A/G Ratio: 1.9 — ABNORMAL HIGH (ref 0.7–1.7)
Albumin ELP: 3.7 g/dL (ref 2.9–4.4)
Alpha-1-Globulin: 0.1 g/dL (ref 0.0–0.4)
Alpha-2-Globulin: 0.8 g/dL (ref 0.4–1.0)
Beta Globulin: 0.8 g/dL (ref 0.7–1.3)
Gamma Globulin: 0.2 g/dL — ABNORMAL LOW (ref 0.4–1.8)
Globulin, Total: 1.9 g/dL — ABNORMAL LOW (ref 2.2–3.9)
Total Protein ELP: 5.6 g/dL — ABNORMAL LOW (ref 6.0–8.5)

## 2019-05-21 ENCOUNTER — Telehealth: Payer: Self-pay

## 2019-05-21 NOTE — Telephone Encounter (Signed)
-----   Message from Lequita Asal, MD sent at 05/21/2019  1:48 PM EDT ----- Regarding: Please call patient  M-spike 0.  M ----- Message ----- From: Buel Ream, [facility] [place] Sent: 05/19/2019   9:19 AM EDT To: Lequita Asal, MD

## 2019-05-21 NOTE — Telephone Encounter (Signed)
Informed patient of M-Spike level. Patient verbalizes understanding and denies any further questions or concerns.

## 2019-05-26 ENCOUNTER — Other Ambulatory Visit: Payer: Self-pay | Admitting: Hematology and Oncology

## 2019-05-26 DIAGNOSIS — C9001 Multiple myeloma in remission: Secondary | ICD-10-CM

## 2019-05-29 ENCOUNTER — Other Ambulatory Visit: Payer: Self-pay

## 2019-05-29 MED ORDER — PAIN MANAGEMENT IT PUMP REFILL: 1.0000 | Freq: Once | INTRATHECAL | 0 refills | Status: DC

## 2019-06-04 ENCOUNTER — Telehealth: Payer: Self-pay | Admitting: Hematology and Oncology

## 2019-06-04 NOTE — Telephone Encounter (Signed)
Re:  Xanax  Discussed with patient his use of Xanax.  Previously, he was on 1 pill TID that was eventually weaned to 1 pill a day by Dr Oliva Bustard.  He took 1/2 a pill one night then none for the last 3 nights.  He is doing well without any withdrawal symptoms.  He would like to remain off of the medication.  He is to contact the clinic if he has any concerns.   Lequita Asal, MD

## 2019-06-05 ENCOUNTER — Other Ambulatory Visit: Payer: Self-pay | Admitting: Hematology and Oncology

## 2019-06-05 DIAGNOSIS — C9001 Multiple myeloma in remission: Secondary | ICD-10-CM

## 2019-06-10 ENCOUNTER — Other Ambulatory Visit: Payer: Self-pay

## 2019-06-10 ENCOUNTER — Encounter: Payer: Self-pay | Admitting: Physician Assistant

## 2019-06-10 ENCOUNTER — Ambulatory Visit (INDEPENDENT_AMBULATORY_CARE_PROVIDER_SITE_OTHER): Payer: Medicare Other | Admitting: Physician Assistant

## 2019-06-10 VITALS — BP 120/78 | HR 101 | Ht 67.0 in | Wt 161.0 lb

## 2019-06-10 DIAGNOSIS — N411 Chronic prostatitis: Secondary | ICD-10-CM

## 2019-06-10 DIAGNOSIS — N5082 Scrotal pain: Secondary | ICD-10-CM

## 2019-06-10 LAB — URINALYSIS, COMPLETE
Bilirubin, UA: NEGATIVE
Glucose, UA: NEGATIVE
Ketones, UA: NEGATIVE
Leukocytes,UA: NEGATIVE
Nitrite, UA: NEGATIVE
Protein,UA: NEGATIVE
RBC, UA: NEGATIVE
Specific Gravity, UA: 1.025 (ref 1.005–1.030)
Urobilinogen, Ur: 0.2 mg/dL (ref 0.2–1.0)
pH, UA: 5.5 (ref 5.0–7.5)

## 2019-06-10 LAB — MICROSCOPIC EXAMINATION
Bacteria, UA: NONE SEEN
RBC, Urine: NONE SEEN /hpf (ref 0–2)

## 2019-06-10 NOTE — Progress Notes (Signed)
06/10/2019 8:17 AM   Logan Perez March 20, 1946 [id number]  CC: Perineal, scrotal pain  HPI: Logan Perez is a 73 y.o. male who presents today for evaluation of possible prostatitis. He is an established [facility] patient who last saw Zara Council on 05/07/2019 for annual follow-up of BPH with LUTS. He takes tamsulosin 0.27m daily with an I-PSS of 5/1.  He reports an incident that occurred 3 days ago in which he experienced low ejaculatory volume with erectile dysfunction and pain upon ejaculation that has lingered and is focused in the perineum.  Pain is constant, dull, and not exacerbated with sitting. He denies gross hematuria, hematospermia, fevers, chills, dysuria, scrotal trauma, scrotal edema, and penile discharge. He has taken nothing at home for symptom palliation.   He finished a course of amoxicillin following a dental procedure 5 days ago.  He has taken tamsulosin for approximately 8 years and never previously noted low ejaculatory volume.  He does not have a history of recurrent UTI, however he does report a remote history of chronic prostatitis, treated approximately 25 years ago.  He states his symptoms today remind him of that episode.  In-office UA and microscopy pan-negative.  PMH: Past Medical History:  Diagnosis Date  . Anxiety   . Atrial fibrillation ([facility]   . BPH (benign prostatic hyperplasia)   . Complication of anesthesia    BAD HEADACHE NIGHT OF FIRST CATARACT  . Compression fracture of lumbar vertebra ([facility]   . Difficulty voiding   . Dysrhythmia    A FIB  . Elevated PSA   . GERD (gastroesophageal reflux disease)   . Hearing aid worn    bilateral  . History of kidney stones   . HLD (hyperlipidemia)   . HOH (hard of hearing)   . Hypertension   . Multiple myeloma ([facility]   . Neuropathy    feet. 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  . Pain    BACK  . Palpitations   . Pneumonia   . Pulmonary embolism ([facility]   . Sepsis ([facility]   . Stroke ([facility]    TIA,  detected on CT scan. pt was unaware    Surgical History: Past Surgical History:  Procedure Laterality Date  . BACK SURGERY  1994  . CATARACT EXTRACTION W/PHACO Right 05/02/2016   Procedure: CATARACT EXTRACTION PHACO AND INTRAOCULAR LENS PLACEMENT (IOC);  Surgeon: WBirder Robson MD;  Location: [facility];  Service: Ophthalmology;  Laterality: Right;  [place]1.06 AP% 20.6 CDE 13.70 FLUID PACK LOT # [id number]  . CATARACT EXTRACTION W/PHACO Left 05/16/2016   Procedure: CATARACT EXTRACTION PHACO AND INTRAOCULAR LENS PLACEMENT (IOC);  Surgeon: WBirder Robson MD;  Location: [facility];  Service: Ophthalmology;  Laterality: Left;  [place]01:43 AP% 19.8 CDE 20.45 FLUID PACK LOT [id number]  . COLONOSCOPY WITH PROPOFOL N/A 03/22/2017   Procedure: COLONOSCOPY WITH PROPOFOL;  Surgeon: WLucilla Lame MD;  [place] [facility]  Service: Endoscopy;  Laterality: N/A;  has port  . ESOPHAGOGASTRODUODENOSCOPY (EGD) WITH PROPOFOL  03/22/2017   Procedure: ESOPHAGOGASTRODUODENOSCOPY (EGD) WITH PROPOFOL;  Surgeon: WLucilla Lame MD;  [place] [facility]  Service: Endoscopy;;  . EYE SURGERY    . KNEE ARTHROSCOPY Left 1992  . LIMBAL STEM CELL TRANSPLANT    . PAIN PUMP IMPLANTATION  2012  . PAIN PUMP IMPLANTATION N/A 09/04/2017   Procedure: INTRATHECAL PUMP BATTERY CHANGE;  Surgeon: NMilinda Pointer MD;  Location: [facility];  Service: Neurosurgery;  Laterality: N/A;  . PORTA CATH INSERTION N/A 12/04/2016  Procedure: Porta Cath Insertion;  Surgeon: Algernon Huxley, MD;  Location: [facility];  Service: Cardiovascular;  Laterality: N/A;  . stem cell implant  2008   [facility]    Home Medications:  Allergies as of 06/10/2019      Reactions   Azithromycin Diarrhea, Other (See Comments)   Possible cause of C. Diff Possible cause of C. Diff Possible cause of C. Diff Possible cause of C. 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 If you have any questions, ask your nurse or doctor.        STOP taking these medications   ALPRAZolam 0.25 MG tablet Commonly known as: XANAX Stopped by: Debroah Loop, PA-C     TAKE these medications   acetaminophen 500 MG tablet Commonly known as: TYLENOL Take 500 mg by mouth 2 (two) times a day.   ARTIFICIAL TEARS OP Place 1 drop as needed into both eyes (for dry eyes).   baclofen 10 MG tablet Commonly known as: LIORESAL Take 1 tablet (10 mg total) by mouth 2 (two) times daily.   bismuth subsalicylate 497 WY/63ZC suspension Commonly known as: PEPTO BISMOL Take 30 mLs by mouth every 6 (six) hours as needed for diarrhea or loose stools.   Calcium-Vitamin D 500-400 MG-UNIT Tabs Take 1 tablet daily by mouth.   cetirizine 10 MG tablet Commonly known as: ZYRTEC Take 10 mg by mouth daily as needed for allergies.   DARZALEX IV Inject 1,200 mg every 30 (thirty) days into the vein.   dexamethasone 4 MG tablet Commonly known as: DECADRON TAKE 4 TABLETS BY MOUTH 1 HOUR PRIOR TO INFUSION, THEN 1 TABLET ON DAYS 2 AND 3 AS DIRECTED   diltiazem 120 MG 24 hr capsule Commonly known as: CARDIZEM CD Take 120 mg daily by mouth.   diltiazem 60 MG tablet Commonly known as: CARDIZEM Take 60 mg daily as needed by mouth (for increased heart rate > 140).   diphenhydrAMINE 25 MG tablet Commonly known as: BENADRYL Take 25 mg by mouth as directed. With chemo treatment   Eliquis 5 MG Tabs tablet Generic drug: apixaban TAKE 1 TABLET BY MOUTH TWICE DAILY   fluticasone 50 MCG/ACT nasal spray Commonly known as: FLONASE Place 1 spray into the nose daily as needed for allergies.   loperamide 2 MG capsule Commonly known as: IMODIUM Take 4 mg as needed by  mouth for diarrhea or loose stools.   loratadine 10 MG tablet Commonly known as: CLARITIN Take 10 mg by mouth daily as needed.   lovastatin 20 MG tablet Commonly known as: MEVACOR Take 20 mg by mouth every evening.   montelukast 10 MG tablet Commonly known as: SINGULAIR TAKE 1 TABLET BY MOUTH DAILY THE DAY BEFORE THE INFUSION, THEN DAY OF AND THE 2 DAYS AFTER INFUSION   MULTIVITAMIN/IRON PO Take 1 tablet daily by mouth.   NON FORMULARY IT pump  Fentanyl 1,500.0 mcg/ml Bupivicaine 30.0 mg/ml Clonidine 300.0 mcg/ml Rate 502.2 mcg/day   omeprazole 20 MG capsule Commonly known as: PRILOSEC Take 20 mg by mouth daily.   ondansetron 8 MG tablet Commonly known as: ZOFRAN  Take 1 tablet (8 mg total) by mouth as directed. Take with chemo and can take it as needed   PAIN MANAGEMENT IT PUMP REFILL 1 each by Intrathecal route once for 1 dose. Medication: PF Fentanyl 1,500.0 mcg/ml PF Bupivicaine 30.0 mg/ml PF Clonidine 300.0 mcg/ml Total Volume: 40 ml Needed by 12-10-2018 @ 1000   PAIN MANAGEMENT IT PUMP REFILL 1 each by Intrathecal route once for 1 dose. Medication: PF Fentanyl 1,500.0 mcg/ml PF Bupivicaine 30.0 mg/ml PF Clonidine 300.mcg/ml Total Volume: 51m Needed by 07-02-19 @ 1000   potassium chloride SA 20 MEQ tablet Commonly known as: K-DUR TAKE 1 TABLET BY MOUTH DAILY   tamsulosin 0.4 MG Caps capsule Commonly known as: FLOMAX Take 1 capsule (0.4 mg total) by mouth daily.   valACYclovir 500 MG tablet Commonly known as: VALTREX TAKE 1 TABLET BY MOUTH DAILY   vitamin B-12 1000 MCG tablet Commonly known as: CYANOCOBALAMIN Take 1,000 mcg daily by mouth.   Vitamin D3 50 MCG (2000 UT) capsule Take 2,000 Units daily by mouth.       Allergies:  Allergies  Allergen Reactions  . Azithromycin Diarrhea and Other (See Comments)    Possible cause of C. Diff Possible cause of C. Diff Possible cause of C. Diff Possible cause of C. Diff  . Bee Pollen Other (See  Comments)    Sneezing, watery eyes, runny nose  . Pollen Extract Other (See Comments)    Sneezing, watery eyes, runny nose  . 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 Rivaroxaban Rash    Family History: Family History  Problem Relation Age of Onset  . Cancer Father        throat  . Kidney disease Sister   . Stroke Other   . Stroke Mother   . Bladder Cancer Neg Hx   . Prostate cancer Neg Hx   . Kidney cancer Neg Hx     Social History:   reports that he quit smoking about 43 years ago. His smoking use included cigarettes. He has a 30.00 pack-year smoking history. He has never used smokeless tobacco. He reports that he does not drink alcohol or use drugs.  ROS: UROLOGY Frequent Urination?: No Hard to postpone urination?: No Burning/pain with urination?: No Get up at night to urinate?: No Leakage of urine?: No Urine stream starts and stops?: No Trouble starting stream?: No Do you have to strain to urinate?: No Blood in urine?: No Urinary tract infection?: No Sexually transmitted disease?: No Injury to kidneys or bladder?: No Painful intercourse?: No Weak stream?: No Erection problems?: No Penile pain?: No  Gastrointestinal Nausea?: No Vomiting?: No Indigestion/heartburn?: No Diarrhea?: Yes Constipation?: No  Constitutional Fever: No Night sweats?: No Weight loss?: No Fatigue?: No  Skin Skin rash/lesions?: No Itching?: No  Eyes Blurred vision?: No Double vision?: No  Ears/Nose/Throat Sore throat?: No Sinus problems?: No  Hematologic/Lymphatic Swollen glands?: No Easy bruising?: No  Cardiovascular Leg swelling?: No Chest pain?: No  Respiratory Cough?: No Shortness of breath?: No  Endocrine Excessive thirst?: No  Musculoskeletal Back pain?: Yes Joint pain?: No  Neurological Headaches?: No Dizziness?: No  Psychologic Depression?: No Anxiety?: No  Physical Exam: BP  120/78 (BP Location: Left Arm, Patient Position: Sitting, Cuff Size: Normal)   Pulse (!) 101   Ht _0  (1.702 m)   Wt 161 lb (73 kg)   BMI 25.22 kg/m   Constitutional:  Alert and  oriented, no acute distress, nontoxic appearing HEENT: [place], AT Cardiovascular: No clubbing, cyanosis, or edema Respiratory: Normal respiratory effort, no increased work of breathing GU: Circumcised penis with bilateral atrophic testes and bilateral tenderness on palpation of the spermatic cords, R>L. Skin: No rashes, bruises or suspicious lesions Neurologic: Grossly intact, no focal deficits, moving all 4 extremities Psychiatric: Normal mood and affect  Laboratory Data: Results for orders placed or performed in visit on 06/10/19  Microscopic Examination   URINE  Result Value Ref Range   WBC, UA 0-5 0 - 5 /hpf   RBC None seen 0 - 2 /hpf   Epithelial Cells (non renal) 0-10 0 - 10 /hpf   Bacteria, UA None seen None seen/Few  Urinalysis, Complete  Result Value Ref Range   Specific Gravity, UA 1.025 1.005 - 1.030   pH, UA 5.5 5.0 - 7.5   Color, UA Yellow Yellow   Appearance Ur Clear Clear   Leukocytes,UA Negative Negative   Protein,UA Negative Negative/Trace   Glucose, UA Negative Negative   Ketones, UA Negative Negative   RBC, UA Negative Negative   Bilirubin, UA Negative Negative   Urobilinogen, Ur 0.2 0.2 - 1.0 mg/dL   Nitrite, UA Negative Negative   Microscopic Examination See below:    Assessment & Plan:   1. Scrotal pain Patient with a 3-day history of ejaculatory-related perineal pain and tenderness most consistent with chronic prostatitis.  UA unconcerning for infection.  Will send for culture.  Patient denies swelling and trauma to the scrotum.  No odor or lesions on physical exam today.  I do not believe he is at risk for torsion or soft tissue infection at this time.  I explained to the patient that there are a myriad of inflammatory and infectious etiologies for pain in the scrotum and  perineum.  I explained that based on his UA today, I suspect his symptoms are inflammatory in nature.  I advised him to wear compressive underwear and treat his pain with OTC NSAIDs including ibuprofen 3 times daily for symptom palliation.  Patient has been on Flomax for 8 years.  This is his first reported incident of low volume ejaculate.  I do not believe this to be a medication side effect, but rather a manifestation of chronic prostatitis.  I advised him to call the clinic back urgently if he develops new fevers or chills indicative of infection.  He expressed an understanding of this plan. - Urinalysis, Complete -Urine culture -Wear compressive underwear -Ibuprofen 400 mg 3 times daily  Debroah Loop, [facility]  [facility] [address] [place], [place] [address] [phone]

## 2019-06-12 LAB — CULTURE, URINE COMPREHENSIVE

## 2019-06-12 NOTE — Progress Notes (Signed)
[facility]  [address] [place], [place] [address] Phone: [phone]  Fax: [phone]   Clinic Day:  06/17/2019  Referring physician: Sofie Hartigan, MD  Chief Complaint: Logan Perez is a 73 y.o. 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   HPI: The patient was last seen in the medical oncology clinic on 05/19/2019.  At that time, he continued to do well.  Chronic diarrhea was well managed with adjustment in his diet (milk products). Patient had a stable grade I neuropathy. Exam was unremarkable. M spike was 0. Hematocrit 36.1, hemoglobin 12.7, MCV 100.6. He continued Eliquis.  He received daratumumab.  Patient contacted the clinic regarding refill of his Xanax.  I spoke with the patient on 06/04/2019.  He decided to try to wean himself off.  He took 1/2 a pill of Xanax for one night then none for the next 3 nights on 06/04/2019. He was doing well without any withdrawal symptoms.  Decision was made to remain off Xanax.  Patient was [facility] with scrotal and [facility]ineal [facility] He denied any swelling or trauma to the scrotum.  Symptoms were similar to an episode of prostatitis 25 years ago.  Urinalysis, microscopic examination, and urine culture were normal.  Symptoms were felt inflammatory in nature.  Recommendation was to wear compressive underwear, OTC NSAIDs, and ibuprofen TID for symptom palliation.  During the interim, he has felt well. He reports having 2 teeth pulled 3 weeks ago. He notes that his gums are still healing.  He is planning to have another tooth pulled. He denies any current tooth pain.   He reports having "pseudohalluciantions" at night (06/05/2019 - 06/08/2019). He has had no more  hallucinations. His restless legs are back.  Baclofen has not helped.  Deep knee bends help.  He plans to do stretching exercises before bed.    Past Medical History:  Diagnosis Date  . Anxiety   . Atrial fibrillation ([facility]   . BPH (benign prostatic hyperplasia)   . Complication of anesthesia    BAD HEADACHE NIGHT OF FIRST CATARACT  . Compression fracture of lumbar vertebra ([facility]   . Difficulty voiding   . Dysrhythmia    A FIB  . Elevated PSA   . GERD (gastroesophageal reflux disease)   . Hearing aid worn    bilateral  . History of kidney stones   . HLD (hyperlipidemia)   . HOH (hard of hearing)   . Hypertension   . Multiple myeloma ([facility]   . Neuropathy    feet. 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  . Pain    BACK  . Palpitations   . Pneumonia   . Pulmonary embolism ([facility]   . Sepsis ([facility]   . Stroke ([facility]    TIA, detected on CT scan. pt was unaware    Past Surgical History:  Procedure Laterality Date  . BACK SURGERY  1994  . CATARACT EXTRACTION W/PHACO Right 05/02/2016   Procedure: CATARACT EXTRACTION PHACO AND INTRAOCULAR LENS PLACEMENT (IOC);  Surgeon: WBirder Robson MD;  Location: [facility];  Service: Ophthalmology;  Laterality: Right;  [place]1.06 AP% 20.6 CDE 13.70 FLUID PACK LOT # [id number]  . CATARACT EXTRACTION W/PHACO Left 05/16/2016   Procedure: CATARACT EXTRACTION PHACO AND INTRAOCULAR LENS PLACEMENT (IOC);  Surgeon: WBirder Robson  MD;  Location: [facility] OR[facility];  Service: Ophthalmology;  Laterality: Left;  [place] 01:43 AP% 19.8 CDE 20.45 FLUID PACK LOT #[id number]  . COLONOSCOPY WITH PROPOFOL N/A 03/22/2017   Procedure: COLONOSCOPY WITH PROPOFOL;  Surgeon: Lucilla Lame, MD;  Location: [place];  Service: Endoscopy;  Laterality: N/A;  has port  . ESOPHAGOGASTRODUODENOSCOPY (EGD) WITH PROPOFOL  03/22/2017   Procedure: ESOPHAGOGASTRODUODENOSCOPY (EGD) WITH PROPOFOL;  Surgeon: Lucilla Lame, MD;  Location: [place];  Service: Endoscopy;;  . EYE SURGERY    . KNEE  ARTHROSCOPY Left 1992  . LIMBAL STEM CELL TRANSPLANT    . PAIN PUMP IMPLANTATION  2012  . PAIN PUMP IMPLANTATION N/A 09/04/2017   Procedure: INTRATHECAL PUMP BATTERY CHANGE;  Surgeon: Milinda Pointer, MD;  Location: [facility];  Service: Neurosurgery;  Laterality: N/A;  . PORTA CATH INSERTION N/A 12/04/2016   Procedure: Glori Luis Cath Insertion;  Surgeon: Algernon Huxley, MD;  Location: [place] [facility];  Service: Cardiovascular;  Laterality: N/A;  . stem cell implant  2008   [facility]    Family History  Problem Relation Age of Onset  . Cancer Father        throat  . Kidney disease Sister   . Stroke Other   . Stroke Mother   . Bladder Cancer Neg Hx   . Prostate cancer Neg Hx   . Kidney cancer Neg Hx     Social History:  reports that he quit smoking about 43 years ago. His smoking use included cigarettes. He has a 30.00 pack-year smoking history. He has never used smokeless tobacco. He reports that he does not drink alcohol or use drugs. He has a wire haired dachshund. He lives in [place]. His wife's name is [name]. The patient is alone today.  Allergies:  Allergies  Allergen Reactions  . Azithromycin Diarrhea and Other (See Comments)    Possible cause of C. Diff Possible cause of C. Diff Possible cause of C. Diff Possible cause of C. Diff  . Bee Pollen Other (See Comments)    Sneezing, watery eyes, runny nose  . Pollen Extract Other (See Comments)    Sneezing, watery eyes, runny nose  . 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 Rivaroxaban Rash    Current Medications: Current Outpatient Medications  Medication Sig Dispense Refill  . acetaminophen (TYLENOL) 500 MG tablet Take 500 mg by mouth 2 (two) times a day.    . baclofen (LIORESAL) 10 MG tablet Take 1 tablet (10 mg total) by mouth 2 (two) times daily. 180 tablet 3  . Calcium Carb-Cholecalciferol (CALCIUM-VITAMIN D) 500-400 MG-UNIT TABS Take 1 tablet daily by mouth.     . cetirizine (ZYRTEC) 10 MG tablet Take 10 mg by mouth daily as needed for allergies.     . Cholecalciferol (VITAMIN D3) 2000 units capsule Take 2,000 Units daily by mouth.     . Daratumumab (DARZALEX IV) Inject 1,200 mg every 30 (thirty) days into the vein.     [name] diltiazem (CARDIZEM CD) 120 MG 24 hr capsule Take 120 mg daily by mouth.     . diltiazem (CARDIZEM) 60 MG tablet Take 60 mg daily as needed by mouth (for increased heart rate > 140).     [name] diphenhydrAMINE (BENADRYL) 25 MG tablet Take 25 mg by mouth as directed. With chemo treatment    . ELIQUIS 5 MG TABS tablet TAKE 1 TABLET BY MOUTH TWICE DAILY 60  tablet 0  . fluticasone (FLONASE) 50 MCG/ACT nasal spray Place 1 spray into the nose daily as needed for allergies.     . Hypromellose (ARTIFICIAL TEARS OP) Place 1 drop as needed into both eyes (for dry eyes).    [name] loperamide (IMODIUM) 2 MG capsule Take 4 mg as needed by mouth for diarrhea or loose stools.     [name] loratadine (CLARITIN) 10 MG tablet Take 10 mg by mouth daily as needed.     . lovastatin (MEVACOR) 20 MG tablet Take 20 mg by mouth every evening.     . montelukast (SINGULAIR) 10 MG tablet TAKE 1 TABLET BY MOUTH DAILY THE DAY BEFORE THE INFUSION, THEN DAY OF AND THE 2 DAYS AFTER INFUSION 30 tablet 0  . Multiple Vitamins-Iron (MULTIVITAMIN/IRON PO) Take 1 tablet daily by mouth.    . NON FORMULARY IT pump  Fentanyl 1,500.0 mcg/ml Bupivicaine 30.0 mg/ml Clonidine 300.0 mcg/ml Rate 502.2 mcg/day    . omeprazole (PRILOSEC) 20 MG capsule Take 20 mg by mouth daily.     . potassium chloride SA (K-DUR,KLOR-CON) 20 MEQ tablet TAKE 1 TABLET BY MOUTH DAILY 90 tablet 0  . tamsulosin (FLOMAX) 0.4 MG CAPS capsule Take 1 capsule (0.4 mg total) by mouth daily. 90 capsule 4  . valACYclovir (VALTREX) 500 MG tablet TAKE 1 TABLET BY MOUTH DAILY 30 tablet 3  . vitamin B-12 (CYANOCOBALAMIN) 1000 MCG tablet Take 1,000 mcg daily by mouth.     . bismuth subsalicylate (PEPTO BISMOL) 262 MG/15ML  suspension Take 30 mLs by mouth every 6 (six) hours as needed for diarrhea or loose stools.    [name] dexamethasone (DECADRON) 4 MG tablet TAKE 4 TABLETS BY MOUTH 1 HOUR PRIOR TO INFUSION, THEN 1 TABLET ON DAYS 2 AND 3 AS DIRECTED (Patient not taking: Reported on 05/19/2019) 30 tablet 0  . ondansetron (ZOFRAN) 8 MG tablet Take 1 tablet (8 mg total) by mouth as directed. Take with chemo and can take it as needed (Patient not taking: Reported on 06/17/2019) 20 tablet 1  . PAIN MANAGEMENT IT PUMP REFILL 1 each by Intrathecal route once for 1 dose. Medication: PF Fentanyl 1,500.0 mcg/ml PF Bupivicaine 30.0 mg/ml PF Clonidine 300.0 mcg/ml Total Volume: 40 ml Needed by 12-10-2018 @ 1000 1 each 0  . PAIN MANAGEMENT IT PUMP REFILL 1 each by Intrathecal route once for 1 dose. Medication: PF Fentanyl 1,500.0 mcg/ml PF Bupivicaine 30.0 mg/ml PF Clonidine 300.mcg/ml Total Volume: 8m Needed by 07-02-19 @ 1000 1 each 0   No current facility-administered medications for this visit.    Facility-Administered Medications Ordered in Other Visits  Medication Dose Route Frequency Provider Last Rate Last Dose  . heparin lock flush 100 unit/mL  500 Units Intravenous Once ,  C, MD      . sodium chloride flush (NS) 0.9 % injection 10 mL  10 mL Intravenous PRN CLequita Asal MD        Review of Systems  Constitutional: Negative.  Negative for chills, diaphoresis, fever, malaise/fatigue and weight loss (up 4 lbs).       Doing well.  HENT: Negative.  Negative for congestion, ear discharge, ear pain, nosebleeds, sinus pain, sore throat and tinnitus.        S/p dental extraction x 2; additional extraction planned.  Eyes: Negative.  Negative for blurred vision, double vision, photophobia and pain.  Respiratory: Negative.  Negative for cough, hemoptysis, sputum production, shortness of breath and wheezing.   Cardiovascular: Negative.  Negative  for chest pain, palpitations, orthopnea, leg swelling and PND.        H/o atrial fibrillation.  Gastrointestinal: Negative.  Negative for abdominal pain, blood in stool, constipation, diarrhea (chronic, on Imodium), heartburn, melena, nausea and vomiting.  Genitourinary: Negative.  Negative for dysuria, frequency, hematuria and urgency.  Musculoskeletal: Negative for back pain (chronic; Fentanyl/bupivacaine/clonidine pump), falls, joint pain, myalgias and neck pain.       Restless legs.  Skin: Negative.  Negative for itching and rash.  Neurological: Negative for dizziness, tremors, sensory change (neuropathy in toes, stable), speech change, focal weakness, weakness and headaches.  Endo/Heme/Allergies: Negative.  Negative for environmental allergies. Does not bruise/bleed easily.  Psychiatric/Behavioral: Negative.  Negative for depression and memory loss. The patient is not nervous/anxious and does not have insomnia.   All other systems reviewed and are negative.  Performance status (ECOG): 1  Vitals Blood pressure 108/71, pulse (!) 109, temperature (!) 96.5 F (35.8 C), temperature source Tympanic, resp. rate 18, height '5\' 7"'  (1.702 m), weight 165 lb 0.2 oz (74.9 kg), SpO2 97 %.  Physical Exam  Constitutional: He is oriented to person, place, and time. He appears well-developed and well-nourished. No distress.  HENT:  Head: Normocephalic and atraumatic.  Mouth/Throat: Oropharynx is clear and moist. No oropharyngeal exudate.  Dark hair with graying. Goatee.  Gums well healing s/p extraction.  Wearing a mask.   Eyes: Pupils are equal, round, and reactive to light. EOM are normal. No scleral icterus.  Glasses. Blue eyes.  Neck: Normal range of motion. Neck supple. No JVD present.  Cardiovascular: Normal rate, regular rhythm and normal heart sounds. Exam reveals no gallop.  No murmur heard. Pulmonary/Chest: Effort normal and breath sounds normal. No respiratory distress. He has no wheezes. He has no rales.  Abdominal: Soft. Bowel sounds are normal. He  exhibits no distension and no mass. There is no abdominal tenderness. There is no rebound and no guarding.  Musculoskeletal: Normal range of motion.        General: No tenderness or edema.     Comments: Right sided implanted Fentanyl/bupivacaine/clonidine pump.   Lymphadenopathy:    He has no cervical adenopathy.    He has no axillary adenopathy.       Right: No supraclavicular adenopathy present.       Left: No supraclavicular adenopathy present.  Neurological: He is alert and oriented to person, place, and time.  Skin: Skin is warm and dry. No rash noted. He is not diaphoretic. No erythema. No pallor.  Psychiatric: He has a normal mood and affect. His behavior is normal. Judgment and thought content normal.  Nursing note and vitals reviewed.   Infusion on 06/17/2019  Component Date Value Ref Range Status  . Magnesium 06/17/2019 1.9  1.7 - 2.4 mg/dL Final   Performed at [facility], [address], [place], [place] [address]  . Sodium 06/17/2019 134* 135 - 145 mmol/L Final  . Potassium 06/17/2019 3.8  3.5 - 5.1 mmol/L Final  . Chloride 06/17/2019 100  98 - 111 mmol/L Final  . CO2 06/17/2019 22  22 - 32 mmol/L Final  . Glucose, Bld 06/17/2019 144* 70 - 99 mg/dL Final  . BUN 06/17/2019 17  8 - 23 mg/dL Final  . Creatinine, Ser 06/17/2019 1.52* 0.61 - 1.24 mg/dL Final  . Calcium 06/17/2019 9.0  8.9 - 10.3 mg/dL Final  . Total Protein 06/17/2019 6.4* 6.5 - 8.1 g/dL Final  . Albumin 06/17/2019 4.1  3.5 - 5.0  g/dL Final  . AST 06/17/2019 24  15 - 41 U/L Final  . ALT 06/17/2019 15  0 - 44 U/L Final  . Alkaline Phosphatase 06/17/2019 39  38 - 126 U/L Final  . Total Bilirubin 06/17/2019 1.0  0.3 - 1.2 mg/dL Final  . GFR calc non Af Amer 06/17/2019 45* >60 mL/min Final  . GFR calc Af Amer 06/17/2019 52* >60 mL/min Final  . Anion gap 06/17/2019 12  5 - 15 Final   Performed at [facility], [address], [place], [place] [address]  . WBC 06/17/2019 6.6  4.0 -  10.5 K/uL Final  . RBC 06/17/2019 3.59* 4.22 - 5.81 MIL/uL Final  . Hemoglobin 06/17/2019 12.5* 13.0 - 17.0 g/dL Final  . HCT 06/17/2019 36.2* 39.0 - 52.0 % Final  . MCV 06/17/2019 100.8* 80.0 - 100.0 fL Final  . MCH 06/17/2019 34.8* 26.0 - 34.0 pg Final  . MCHC 06/17/2019 34.5  30.0 - 36.0 g/dL Final  . RDW 06/17/2019 13.4  11.5 - 15.5 % Final  . Platelets 06/17/2019 203  150 - 400 K/uL Final  . nRBC 06/17/2019 0.0  0.0 - 0.2 % Final  . Neutrophils Relative % 06/17/2019 52  % Final  . Neutro Abs 06/17/2019 3.5  1.7 - 7.7 K/uL Final  . Lymphocytes Relative 06/17/2019 35  % Final  . Lymphs Abs 06/17/2019 2.3  0.7 - 4.0 K/uL Final  . Monocytes Relative 06/17/2019 10  % Final  . Monocytes Absolute 06/17/2019 0.7  0.1 - 1.0 K/uL Final  . Eosinophils Relative 06/17/2019 2  % Final  . Eosinophils Absolute 06/17/2019 0.2  0.0 - 0.5 K/uL Final  . Basophils Relative 06/17/2019 1  % Final  . Basophils Absolute 06/17/2019 0.0  0.0 - 0.1 K/uL Final  . Immature Granulocytes 06/17/2019 0  % Final  . Abs Immature Granulocytes 06/17/2019 0.01  0.00 - 0.07 K/uL Final   Performed at [facility], [address] [place], [place] [address]    Assessment:  Logan Perez is a 73 y.o. male with stage III mutiple myeloma. He initially presented with progressive back pain beginning in 12/2006. MRI revealed "spots and compression fractures". He began Velcade, thalidomide, and Decadron. In 08/2007, he underwent high dose chemotherapy and autologous stem cell transplant. 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  He recurred with a rising M-spike (2.7) with repeat M spike (1.7 gm/dl) in 03/2010. He was initially treated with Velcade (02/08/2010 - 05/10/2010). He then began Revlimid (15 mg 3 weeks on/1 week off) and Decadron (40 mg on day 1, 8, 15, 22). Because of significant side effect with Decadron his dose was decreased to 10 mg once a week in 07/2010.   He  was on maintenance Revlimid. Revlimid was initially10 mg 3 weeks on/1 week off. This was changed to 10 mg 2 weeks on/2 weeks off secondary to right nipple tenderness. His dose was increased to 10 mg 3 weeks on/1 week off with Decadron 10 mg a week (on Sundays) and then Revlamid 15 mg 3 weeks on and 1 week off with Decadron on Sundays. He began Pomalyst4 mg 3 weeks on/1 week off with Decadron on 08/27/2015.  Over the past year his SPEPhas revealed no monoclonal protein (04/21/2015) and 0.5 gm/dL on 09/22/2015 and 10/20/2015. M spikewas 0.1 on 02/02/2016, 03/01/2016, 03/29/2016, 04/26/2016, and 05/24/2016. M spikewas 0 on 10/11/2016, 11/28/2016, 02/05/2017, 03/21/2017, 08/02/2017, 10/04/2017, 12/27/2017, 01/24/2018, and 02/21/2018. M spikewas 0.1  on 11/01/2017, 0.1 on 11/29/2017, 0 on 12/27/2017, 0 on 01/24/2018, 0 on 02/21/2018, 0.1 on 03/21/2018, 0.3 on 04/18/2018, 0.1 on 05/16/2018, 0 on 06/13/2018, 0.2 on 08/08/2018, 0 on 09/09/2018, 0.1 on 11/04/2018, 0 on 12/02/2018, 0.1 on 12/30/2018, 0 on 01/27/2019, 0.1 on 02/24/2019, 0.1 on 03/24/2019, 0.1 on 04/23/2019, 0 on 05/19/2019, and 0.1 on 06/17/2019.  Free light chainshave been monitored. Kappa free light chainswere 18.54 on 11/21/2013, 18.37 on 02/20/2014, 18.93 on 05/22/2014, 32.58 (high; normal ratio 1.27) on 08/21/2014, 51.53 (high; elevated ratio of 2.12) on 11/13/2014, 28.08 (ratio 1.73) on 12/09/2014, 23.71 (ratio 2.17) on 01/18/2015, 92.93 (ratio 9.49) on 04/21/2015, 93.44 (ratio 10.28) on 05/26/2015, 255.45 (ratio of 24.05) on 07/14/2015, 373.89 (ratio 48.31) on 08/04/2015, 474.33 (ratio 70.58) on 08/25/2015, 450.76 (ratio 55.44) on 09/22/2015, 453.4 (ratio 59.89) on 10/20/2015, 58.26 (ratio of >40.74) on 02/02/2016, 62.67 (ratio of 37.98) on 03/01/2016, 75.7 (ratio >50.47) on 03/29/2016, 79.7 (ratio 53.13) on 04/26/2016, 108.6 (ratio >72.4) on 05/24/2016, 8.3 (1.46 ratio) on 10/11/2016, 6.7 (0.81 ratio) on 02/05/2017, 7.8 (1.01  ratio) on 03/21/2017,7.6 (ratio 0.63) on 06/01/2017, 8.1 (ratio 1.13) on 11/29/2017, 9.6 (ratio 1.55) on 06/13/2018, 6.6 (ratio 2.06) on 07/11/2018, 6.9 (ratio 0.82) on 08/08/2018, 5.8 (ratio 1.66) on 09/09/2018, 5.8 (ratio 1.05) on 11/04/2018, 5.6(ratio 2.55)on 12/02/2018, 5.5 (ratio 1.96) on 01/27/2019.  Bone surveyon 12/08/2014 was stable. Bone survey on 10/21/2015 revealed increase conspicuity of subcentimeter lytic lesions in the calvarium.  Bone marrow aspirate and biopsyon 11/04/2015 revealed an atypical monoclonal plasma cells estimated at 30-40% of marrow cells. Marrow was variably cellular (approximately 45%) with background trilineage hematopoiesis. There was no significant increase in marrow reticulin fibers. Storage iron was present.   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 He develoed herpes zoster in 04/2008. He developed a pulmonary embolismin 05/2013. He was initially on Xarelto, but is now on Eliquis. He had an episode of pneumonia around this time requiring a brief admission. He developed severe lower leg cramps on 08/07/2014 secondary to hypokalemia. Duplex was negative.   He was treated forC difficile colitis(Flagyl completed 07/30/2015). He has a chronic indwelling pain pump.  He received 4 cycles of Pomalyst and Decadron(08/27/2015 - 11/19/2015). Restaging studies document progressive disease. Kappa free light chains are increasing. SPEP revealed 0.5 gm/dL monoclonal protein then 1.3 gm/dL. Bone survey reveals increase conspicuity of subcentimeter lytic lesions in the calvarium. Bone marrow reveals 30-40% plasma cells.   MUGAon 11/30/2015 revealed an ejection fraction of 46%. He is felt not to be a good candidate for Kyprolis. There were no focal wall motion abnormalities. He had a stress echo less than 1 year ago. He has a history of PVCs and atrial fibrillation. He takes Cardizem prn.   He received 17 weeks of daratumumab(Darzalex) (12/09/2015 - 05/25/2016). He tolerated treatment well without side effect.  Bone marrow on 02/09/2016 revealed no diagnostic morphologic evidence of plasma cell myeloma. Marrow was normocellular to hypocellular marrow for age (ranging from 10-40%) with maturing trilineage hematopoiesis and mild multilineage dyspoiesis. There was patchy mild increase in reticulin. Storage iron was present. Flow cytometry revealed no definitive evidence of monoclonality. There was a non-specific atypical myeloid and monocytic findings with no increase in blasts. Cytogenetics were normal (46, XY).  He is currently56month s/p 2nd autologous stem cell transplanton 06/16/2016. 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 He also experienced atrial fibrillation with intermittent episodes of RVR requiring IV beta blockers. He is on prophylactic  valacyclovir for 1 year post transplant.  He started vaccinations(DTaP-Pediatric triple vaccine, Hep B- Pediatrix triple vaccine, Haemophilus influenza B (Hib), inactivated polio virus (IPV), pneumococcal conjugate vaccine 13-valent (PCV 13)) on 04/17/2017. Recommendation was for Shingrix vaccine to be given in Renovo and second dose of Shingrix in 2 months. He had follow-up vaccinations on 08/28/2017. He had his second shingles vaccine. He received 18 month vaccinations- DTaP-Pediatric triple vaccine, Hep B- Pediatrix triple vaccine, Haemophilus influenza B (Hib), inactivated polio virus (IPV), pneumococcal conjugate vaccine 13-valent (PCV 13) on 02/05/2018.  He is s/p31cycles ofdaratumumab(Darzalex) post transplant(12/05/2016 - 05/19/2019).  PET scanon 08/14/2018 revealed no evidence of active myeloma on whole-body FDG PET scan. There was no evidence soft tissue plasmacytoma. There was no clear evidence of lytic lesions on the CT portion exam. Some  lucencies in the pelvis were stable. There were chronic compression fractures in the lower thoracic spine.  He has a history of hypomagnesemiathat required IV magnesium (2-4 gm) weekly. He has not required magnesium since 10/24/2016. Patient has atrial fibrillationand is on Eliquis.  Symptomatically, he is doing well.  He has had some restless legs which appear to improve with stretching.  He has had some dental extractions.  Exam is stable.  Plan: 1.   Labs today: CBC with diff, CMP, magnesium, SPEP, FLCA. 2.Multiple myeloma Clinically, he is doing well.  M spike has fluctuated over the past several months between 0 and 0.2 gm/dL. M-spike is 0.1 today. Labs reviewed.  Blood counts and electrolytes stable for treatment.   Patient took his premeds prior to clinic.   Cycle #32 daratumumab today. Continue Valacyclovir prophylaxis until 3 months status post daratumumab. Patient is no longer followed at the transplant center. Periodic phone follow-up with Dr. Adriana Simas at [facility].             Discuss symptom management.  He has antiemetics and pain medications at home to use on a prn bases.  Interventions are adequate.    3. Macrocytic anemia Hematocrit 36.2.  Hemoglobin12.5. MCV100.8. B12, folate and TSH were normal in 01/27/2019. Continue to monitor. 4.Neuropathy Patient has a stable grade I neuropathy.  No intervention is needed. 5.Chronic diarrhea Patient has a stable well-managed diarrhea. Milk products precipitate diarrhea (ice cream and Boost). Continue to monitor. 6.History of pulmonary embolism(2014) Continue Eliquis. 7.   RTC in 1 month for MD assessment, labs (CBC with diff, CMP, Mg, SPEP), and cycle #33 daratumumab.  I discussed the assessment and treatment plan with the patient.  The patient was provided an opportunity to ask questions and all were answered.  The patient agreed with the plan  and demonstrated an understanding of the instructions.  The patient was advised to call back if the symptoms worsen or if the condition fails to improve as anticipated.  I provided 15 minutes of face-to-face time during this this encounter and > 50% was spent counseling as documented under my assessment and plan.    Lequita Asal, MD, PhD    06/17/2019, 9:42 AM  I, Selena Batten, am acting as scribe for [employer]. Mike Gip, MD, PhD.  I,  C. Mike Gip, MD, have reviewed the above documentation for accuracy and completeness, and I agree with the above.

## 2019-06-17 ENCOUNTER — Encounter: Payer: Self-pay | Admitting: Hematology and Oncology

## 2019-06-17 ENCOUNTER — Inpatient Hospital Stay: Payer: Medicare Other | Attending: Hematology and Oncology | Admitting: Hematology and Oncology

## 2019-06-17 ENCOUNTER — Other Ambulatory Visit: Payer: Self-pay

## 2019-06-17 ENCOUNTER — Inpatient Hospital Stay: Payer: Medicare Other

## 2019-06-17 VITALS — BP 108/71 | HR 109 | Temp 96.5°F | Resp 18 | Ht 67.0 in | Wt 165.0 lb

## 2019-06-17 VITALS — BP 156/88 | HR 76

## 2019-06-17 DIAGNOSIS — Z9484 Stem cells transplant status: Secondary | ICD-10-CM

## 2019-06-17 DIAGNOSIS — Z5112 Encounter for antineoplastic immunotherapy: Secondary | ICD-10-CM

## 2019-06-17 DIAGNOSIS — Z79899 Other long term (current) drug therapy: Secondary | ICD-10-CM | POA: Diagnosis not present

## 2019-06-17 DIAGNOSIS — G2581 Restless legs syndrome: Secondary | ICD-10-CM

## 2019-06-17 DIAGNOSIS — Z86711 Personal history of pulmonary embolism: Secondary | ICD-10-CM | POA: Insufficient documentation

## 2019-06-17 DIAGNOSIS — C9000 Multiple myeloma not having achieved remission: Secondary | ICD-10-CM

## 2019-06-17 DIAGNOSIS — I10 Essential (primary) hypertension: Secondary | ICD-10-CM | POA: Insufficient documentation

## 2019-06-17 DIAGNOSIS — K529 Noninfective gastroenteritis and colitis, unspecified: Secondary | ICD-10-CM

## 2019-06-17 DIAGNOSIS — Z8673 Personal history of transient ischemic attack (TIA), and cerebral infarction without residual deficits: Secondary | ICD-10-CM | POA: Diagnosis not present

## 2019-06-17 DIAGNOSIS — C9001 Multiple myeloma in remission: Secondary | ICD-10-CM | POA: Diagnosis not present

## 2019-06-17 DIAGNOSIS — I2782 Chronic pulmonary embolism: Secondary | ICD-10-CM | POA: Diagnosis not present

## 2019-06-17 DIAGNOSIS — I4891 Unspecified atrial fibrillation: Secondary | ICD-10-CM | POA: Diagnosis not present

## 2019-06-17 DIAGNOSIS — C9002 Multiple myeloma in relapse: Secondary | ICD-10-CM

## 2019-06-17 DIAGNOSIS — Z7901 Long term (current) use of anticoagulants: Secondary | ICD-10-CM

## 2019-06-17 DIAGNOSIS — D539 Nutritional anemia, unspecified: Secondary | ICD-10-CM

## 2019-06-17 LAB — CBC WITH DIFFERENTIAL/PLATELET
Abs Immature Granulocytes: 0.01 10*3/uL (ref 0.00–0.07)
Basophils Absolute: 0 10*3/uL (ref 0.0–0.1)
Basophils Relative: 1 %
Eosinophils Absolute: 0.2 10*3/uL (ref 0.0–0.5)
Eosinophils Relative: 2 %
HCT: 36.2 % — ABNORMAL LOW (ref 39.0–52.0)
Hemoglobin: 12.5 g/dL — ABNORMAL LOW (ref 13.0–17.0)
Immature Granulocytes: 0 %
Lymphocytes Relative: 35 %
Lymphs Abs: 2.3 10*3/uL (ref 0.7–4.0)
MCH: 34.8 pg — ABNORMAL HIGH (ref 26.0–34.0)
MCHC: 34.5 g/dL (ref 30.0–36.0)
MCV: 100.8 fL — ABNORMAL HIGH (ref 80.0–100.0)
Monocytes Absolute: 0.7 10*3/uL (ref 0.1–1.0)
Monocytes Relative: 10 %
Neutro Abs: 3.5 10*3/uL (ref 1.7–7.7)
Neutrophils Relative %: 52 %
Platelets: 203 10*3/uL (ref 150–400)
RBC: 3.59 MIL/uL — ABNORMAL LOW (ref 4.22–5.81)
RDW: 13.4 % (ref 11.5–15.5)
WBC: 6.6 10*3/uL (ref 4.0–10.5)
nRBC: 0 % (ref 0.0–0.2)

## 2019-06-17 LAB — COMPREHENSIVE METABOLIC PANEL
ALT: 15 U/L (ref 0–44)
AST: 24 U/L (ref 15–41)
Albumin: 4.1 g/dL (ref 3.5–5.0)
Alkaline Phosphatase: 39 U/L (ref 38–126)
Anion gap: 12 (ref 5–15)
BUN: 17 mg/dL (ref 8–23)
CO2: 22 mmol/L (ref 22–32)
Calcium: 9 mg/dL (ref 8.9–10.3)
Chloride: 100 mmol/L (ref 98–111)
Creatinine, Ser: 1.52 mg/dL — ABNORMAL HIGH (ref 0.61–1.24)
GFR calc Af Amer: 52 mL/min — ABNORMAL LOW (ref >60)
GFR calc non Af Amer: 45 mL/min — ABNORMAL LOW (ref >60)
Glucose, Bld: 144 mg/dL — ABNORMAL HIGH (ref 70–99)
Potassium: 3.8 mmol/L (ref 3.5–5.1)
Sodium: 134 mmol/L — ABNORMAL LOW (ref 135–145)
Total Bilirubin: 1 mg/dL (ref 0.3–1.2)
Total Protein: 6.4 g/dL — ABNORMAL LOW (ref 6.5–8.1)

## 2019-06-17 LAB — MAGNESIUM: Magnesium: 1.9 mg/dL (ref 1.7–2.4)

## 2019-06-17 MED ORDER — SODIUM CHLORIDE 0.9% FLUSH
10.0000 mL | INTRAVENOUS | Status: DC | PRN
  Filled 2019-06-17: qty 10

## 2019-06-17 MED ORDER — SODIUM CHLORIDE 0.9 % IV SOLN
Freq: Once | INTRAVENOUS | Status: AC
  Administered 2019-06-17: 10:00:00 via INTRAVENOUS
  Filled 2019-06-17: qty 250

## 2019-06-17 MED ORDER — SODIUM CHLORIDE 0.9 % IV SOLN
1200.0000 mg | Freq: Once | INTRAVENOUS | Status: AC
  Administered 2019-06-17: 11:00:00 1200 mg via INTRAVENOUS
  Filled 2019-06-17: qty 60

## 2019-06-17 MED ORDER — HEPARIN SOD (PORK) LOCK FLUSH 100 UNIT/ML IV SOLN
500.0000 [IU] | Freq: Once | INTRAVENOUS | Status: AC
  Administered 2019-06-17: 500 [IU] via INTRAVENOUS

## 2019-06-17 NOTE — Progress Notes (Signed)
No new changes noted today 

## 2019-06-17 NOTE — Patient Instructions (Signed)
Daratumumab injection What is this medicine? DARATUMUMAB (dar a toom ue mab) is a monoclonal antibody. It is used to treat multiple myeloma. This medicine may be used for other purposes; ask your health care provider or pharmacist if you have questions. COMMON BRAND NAME(S): DARZALEX What should I tell my health care provider before I take this medicine? They need to know if you have any of these conditions:  infection (especially a virus infection such as chickenpox, herpes, or hepatitis B virus)  lung or breathing disease  an unusual or allergic reaction to daratumumab, other medicines, foods, dyes, or preservatives  pregnant or trying to get pregnant  breast-feeding How should I use this medicine? This medicine is for infusion into a vein. It is given by a health care professional in a hospital or clinic setting. Talk to your pediatrician regarding the use of this medicine in children. Special care may be needed. Overdosage: If you think you have taken too much of this medicine contact a poison control center or emergency room at once. NOTE: This medicine is only for you. Do not share this medicine with others. What if I miss a dose? Keep appointments for follow-up doses as directed. It is important not to miss your dose. Call your doctor or health care professional if you are unable to keep an appointment. What may interact with this medicine? Interactions have not been studied. This list may not describe all possible interactions. Give your health care provider a list of all the medicines, herbs, non-prescription drugs, or dietary supplements you use. Also tell them if you smoke, drink alcohol, or use illegal drugs. Some items may interact with your medicine. What should I watch for while using this medicine? This drug may make you feel generally unwell. Report any side effects. Continue your course of treatment even though you feel ill unless your doctor tells you to stop. This  medicine can cause serious allergic reactions. To reduce your risk you may need to take medicine before treatment with this medicine. Take your medicine as directed. This medicine can affect the results of blood tests to match your blood type. These changes can last for up to 6 months after the final dose. Your healthcare provider will do blood tests to match your blood type before you start treatment. Tell all of your healthcare providers that you are being treated with this medicine before receiving a blood transfusion. This medicine can affect the results of some tests used to determine treatment response; extra tests may be needed to evaluate response. Do not become pregnant while taking this medicine or for 3 months after stopping it. Women should inform their doctor if they wish to become pregnant or think they might be pregnant. There is a potential for serious side effects to an unborn child. Talk to your health care professional or pharmacist for more information. What side effects may I notice from receiving this medicine? Side effects that you should report to your doctor or health care professional as soon as possible:  allergic reactions like skin rash, itching or hives, swelling of the face, lips, or tongue  breathing problems  chills  cough  dizziness  feeling faint or lightheaded  headache  low blood counts - this medicine may decrease the number of white blood cells, red blood cells and platelets. You may be at increased risk for infections and bleeding.  nausea, vomiting  shortness of breath  signs of decreased platelets or bleeding - bruising, pinpoint red spots on  the skin, black, tarry stools, blood in the urine  signs of decreased red blood cells - unusually weak or tired, feeling faint or lightheaded, falls  signs of infection - fever or chills, cough, sore throat, pain or difficulty passing urine  signs and symptoms of liver injury like dark yellow or brown  urine; general ill feeling or flu-like symptoms; light-colored stools; loss of appetite; right upper belly pain; unusually weak or tired; yellowing of the eyes or skin Side effects that usually do not require medical attention (report to your doctor or health care professional if they continue or are bothersome):  back pain  constipation  loss of appetite  diarrhea  joint pain  muscle cramps  pain, tingling, numbness in the hands or feet  swelling of the ankles, feet, hands  tiredness  trouble sleeping This list may not describe all possible side effects. Call your doctor for medical advice about side effects. You may report side effects to FDA at 1-800-FDA-1088. Where should I keep my medicine? Keep out of the reach of children. This drug is given in a hospital or clinic and will not be stored at home. NOTE: This sheet is a summary. It may not cover all possible information. If you have questions about this medicine, talk to your doctor, pharmacist, or health care provider.  2020 Elsevier/Gold Standard (2018-07-11 14:00:48)

## 2019-06-18 LAB — PROTEIN ELECTROPHORESIS, SERUM
A/G Ratio: 1.9 — ABNORMAL HIGH (ref 0.7–1.7)
Albumin ELP: 3.9 g/dL (ref 2.9–4.4)
Alpha-1-Globulin: 0.1 g/dL (ref 0.0–0.4)
Alpha-2-Globulin: 0.9 g/dL (ref 0.4–1.0)
Beta Globulin: 0.8 g/dL (ref 0.7–1.3)
Gamma Globulin: 0.2 g/dL — ABNORMAL LOW (ref 0.4–1.8)
Globulin, Total: 2.1 g/dL — ABNORMAL LOW (ref 2.2–3.9)
M-Spike, %: 0.1 g/dL — ABNORMAL HIGH
Total Protein ELP: 6 g/dL (ref 6.0–8.5)

## 2019-06-18 LAB — KAPPA/LAMBDA LIGHT CHAINS
Kappa free light chain: 7.6 mg/L (ref 3.3–19.4)
Kappa, lambda light chain ratio: 2.38 — ABNORMAL HIGH (ref 0.26–1.65)
Lambda free light chains: 3.2 mg/L — ABNORMAL LOW (ref 5.7–26.3)

## 2019-06-30 ENCOUNTER — Other Ambulatory Visit: Payer: Self-pay | Admitting: Urology

## 2019-07-02 ENCOUNTER — Other Ambulatory Visit: Payer: Self-pay

## 2019-07-02 ENCOUNTER — Ambulatory Visit: Payer: Medicare Other | Attending: Pain Medicine | Admitting: Pain Medicine

## 2019-07-02 VITALS — BP 112/81 | HR 107 | Temp 98.4°F | Resp 16 | Ht 67.0 in | Wt 165.0 lb

## 2019-07-02 DIAGNOSIS — Z451 Encounter for adjustment and management of infusion pump: Secondary | ICD-10-CM | POA: Insufficient documentation

## 2019-07-02 DIAGNOSIS — G8929 Other chronic pain: Secondary | ICD-10-CM | POA: Diagnosis present

## 2019-07-02 DIAGNOSIS — G894 Chronic pain syndrome: Secondary | ICD-10-CM | POA: Diagnosis present

## 2019-07-02 DIAGNOSIS — M5416 Radiculopathy, lumbar region: Secondary | ICD-10-CM | POA: Insufficient documentation

## 2019-07-02 DIAGNOSIS — S22080S Wedge compression fracture of T11-T12 vertebra, sequela: Secondary | ICD-10-CM

## 2019-07-02 DIAGNOSIS — C9001 Multiple myeloma in remission: Secondary | ICD-10-CM | POA: Insufficient documentation

## 2019-07-02 DIAGNOSIS — Z95828 Presence of other vascular implants and grafts: Secondary | ICD-10-CM | POA: Diagnosis present

## 2019-07-02 DIAGNOSIS — M62838 Other muscle spasm: Secondary | ICD-10-CM | POA: Diagnosis present

## 2019-07-02 MED ORDER — BACLOFEN 10 MG PO TABS: 10.0000 mg | ORAL_TABLET | Freq: Two times a day (BID) | ORAL | 3 refills | Status: DC

## 2019-07-02 NOTE — Progress Notes (Signed)
Patient's Name: Logan Perez  MRN: [id number]  Referring Provider: Sofie Hartigan, MD  DOB: 1946/06/11  PCP: Sofie Hartigan, MD  DOS: 07/02/2019  Note by: Gaspar Cola, MD  Service setting: Ambulatory outpatient  Specialty: Interventional Pain Management  Patient type: Established  Location: [facility] (AMB) Pain Management Facility  Visit type: Interventional Procedure   Primary Reason for Visit: Interventional Pain Management Treatment. CC: Back Pain (lower)  Procedure:          Intrathecal Drug Delivery System (IDDS):  Type: Reservoir Refill [phone]) No rate change Region: Abdominal Laterality: Right  Type of Pump: Medtronic Synchromed II Delivery Route: Intrathecal Type of Pain Treated: Neuropathic/Nociceptive Primary Medication Class: Opioid/opiate  Medication, Concentration, Infusion Program, & Delivery Rate: Please see scanned programming printout.   Indications: 1. Chronic pain syndrome   2. Multiple myeloma in remission ([place])   3. Compression fracture of T12 vertebra (HCC) (70-75% magnitude) (with mild retropulsion)   4. Chronic lumbar radicular pain   5. Presence of implanted infusion pump (Medtronic, programmable, intrathecal pump)   6. Encounter for adjustment or management of infusion pump    Pain Assessment: Self-Reported Pain Score: 2 /10             Reported level is compatible with observation.        Pharmacotherapy Assessment  Analgesic: Intrathecal PF-Fentanyl 502.2 mcg/day (20.9 mcg/hr) MME/day: 50.16 mg/day.   Monitoring: Pharmacotherapy: No side-effects or adverse reactions reported. [place] PMP: PDMP not reviewed this encounter.       Compliance: No problems identified. Effectiveness: Clinically acceptable. Plan: Refer to "POC".  UDS: No results found for: SUMMARY Intrathecal Pump Therapy Assessment  Manufacturer: Medtronic Synchromed Type: Programmable Volume: 40 mL reservoir MRI compatibility: Yes   Drug content:  Primary  Medication Class:Opioid Primary Medication:PF-Fentanyl(1500 mcg/mL) Secondary Medication:PF-Bupivacaine(30 mg/mL) Other Medication:PF-Clonidine(300 mcg/mL)   Programming:  Type: Simple continuous. See pump readout for details.   Changes:  Medication Change: None at this point Rate Change: No change in rate  Reported side-effects or adverse reactions: None reported  Effectiveness: Described as relatively effective, allowing for increase in activities of daily living (ADL) Clinically meaningful improvement in function (CMIF): Sustained CMIF goals met  Plan: Pump refill today  Pre-op Assessment:  Logan Perez is a 73 y.o. (year old), male patient, seen today for interventional treatment. He  has a past surgical history that includes Knee arthroscopy (Left, 1992); Pain pump implantation (2012); stem cell implant (2008); Cataract extraction w/PHACO (Right, 05/02/2016); Cataract extraction w/PHACO (Left, 05/16/2016); Limbal stem cell transplant; PORTA CATH INSERTION (N/A, 12/04/2016); Colonoscopy with propofol (N/A, 03/22/2017); Esophagogastroduodenoscopy (egd) with propofol (03/22/2017); Eye surgery; Back surgery (1994); and Pain pump implantation (N/A, 09/04/2017). Mr. Everard has a current medication list which includes the following prescription(s): acetaminophen, baclofen, bismuth subsalicylate, calcium-vitamin d, cetirizine, vitamin d3, daratumumab, dexamethasone, diltiazem, diltiazem, diphenhydramine, eliquis, fluticasone, carboxymethylcellulose sodium, loperamide, loratadine, lovastatin, montelukast, multiple vitamins-iron, NON FORMULARY, omeprazole, ondansetron, potassium chloride sa, tamsulosin, valacyclovir, vitamin b-12, PAIN MANAGEMENT IT PUMP REFILL, and PAIN MANAGEMENT IT PUMP REFILL. His primarily concern today is the Back Pain (lower)  Initial Vital Signs:  Pulse/HCG Rate: (!) 107  Temp: 98.4 F (36.9 C) Resp: 16 BP: 112/81 SpO2: 98 %  BMI: Estimated body mass index is  25.84 kg/m as calculated from the following:   Height as of this encounter: '5\' 7"'  (1.702 m).   Weight as of this encounter: 165 lb (74.8 kg).  Risk Assessment: Allergies: Reviewed. He is allergic to azithromycin; bee  pollen; pollen extract; zoledronic acid; and rivaroxaban.  Allergy Precautions: None required Coagulopathies: Reviewed. None identified.  Blood-thinner therapy: None at this time Active Infection(s): Reviewed. None identified. Logan Perez is afebrile  Site Confirmation: Logan Perez was asked to confirm the procedure and laterality before marking the site Procedure checklist: Completed Consent: Before the procedure and under the influence of no sedative(s), amnesic(s), or anxiolytics, the patient was informed of the treatment options, risks and possible complications. To fulfill our ethical and legal obligations, as recommended by the American Medical Association's Code of Ethics, I have informed the patient of my clinical impression; the nature and purpose of the treatment or procedure; the risks, benefits, and possible complications of the intervention; the alternatives, including doing nothing; the risk(s) and benefit(s) of the alternative treatment(s) or procedure(s); and the risk(s) and benefit(s) of doing nothing.  Mr. Lia was provided with information about the general risks and possible complications associated with most interventional procedures. These include, but are not limited to: failure to achieve desired goals, infection, bleeding, organ or nerve damage, allergic reactions, paralysis, and/or death.  In addition, he was informed of those risks and possible complications associated to this particular procedure, which include, but are not limited to: damage to the implant; failure to decrease pain; local, systemic, or serious CNS infections, intraspinal abscess with possible cord compression and paralysis, or life-threatening such as meningitis; bleeding; organ damage;  nerve injury or damage with subsequent sensory, motor, and/or autonomic system dysfunction, resulting in transient or permanent pain, numbness, and/or weakness of one or several areas of the body; allergic reactions, either minor or major life-threatening, such as anaphylactic or anaphylactoid reactions.  Furthermore, Mr. Mcgann was informed of those risks and complications associated with the medications. These include, but are not limited to: allergic reactions (i.e.: anaphylactic or anaphylactoid reactions); endorphine suppression; bradycardia and/or hypotension; water retention and/or peripheral vascular relaxation leading to lower extremity edema and possible stasis ulcers; respiratory depression and/or shortness of breath; decreased metabolic rate leading to weight gain; swelling or edema; medication-induced neural toxicity; particulate matter embolism and blood vessel occlusion with resultant organ, and/or nervous system infarction; and/or intrathecal granuloma formation with possible spinal cord compression and permanent paralysis.  Before refilling the pump Mr. Orlich was informed that some of the medications used in the devise may not be FDA approved for such use and therefore it constitutes an off-label use of the medications.  Finally, he was informed that Medicine is not an exact science; therefore, there is also the possibility of unforeseen or unpredictable risks and/or possible complications that may result in a catastrophic outcome. The patient indicated having understood very clearly. We have given the patient no guarantees and we have made no promises. Enough time was given to the patient to ask questions, all of which were answered to the patient's satisfaction. Mr. Ginsberg has indicated that he wanted to continue with the procedure. Attestation: I, the ordering provider, attest that I have discussed with the patient the benefits, risks, side-effects, alternatives, likelihood of  achieving goals, and potential problems during recovery for the procedure that I have provided informed consent. Date  Time: 07/02/2019 11:53 AM  Pre-Procedure Preparation:  Monitoring: As per clinic protocol. Respiration, ETCO2, SpO2, BP, heart rate and rhythm monitor placed and checked for adequate function Safety Precautions: Patient was assessed for positional comfort and pressure points before starting the procedure. Time-out: I initiated and conducted the "Time-out" before starting the procedure, as per protocol. The patient was asked to participate  by confirming the accuracy of the "Time Out" information. Verification of the correct person, site, and procedure were performed and confirmed by me, the nursing staff, and the patient. "Time-out" conducted as per Joint Commission's Universal Protocol (UP.01.01.01). Time: 1210  Description of Procedure:          Position: Supine Target Area: Central-port of intrathecal pump. Approach: Anterior, 90 degree angle approach. Area Prepped: Entire Area around the pump implant. Prepping solution: DuraPrep (Iodine Povacrylex [0.7% available iodine] and Isopropyl Alcohol, 74% w/w) Safety Precautions: Aspiration looking for blood return was conducted prior to all injections. At no point did we inject any substances, as a needle was being advanced. No attempts were made at seeking any paresthesias. Safe injection practices and needle disposal techniques used. Medications properly checked for expiration dates. SDV (single dose vial) medications used. Description of the Procedure: Protocol guidelines were followed. Two nurses trained to do implant refills were present during the entire procedure. The refill medication was checked by both healthcare providers as well as the patient. The patient was included in the "Time-out" to verify the medication. The patient was placed in position. The pump was identified. The area was prepped in the usual manner. The sterile  template was positioned over the pump, making sure the side-port location matched that of the pump. Both, the pump and the template were held for stability. The needle provided in the Medtronic Kit was then introduced thru the center of the template and into the central port. The pump content was aspirated and discarded volume documented. The new medication was slowly infused into the pump, thru the filter, making sure to avoid overpressure of the device. The needle was then removed and the area cleansed, making sure to leave some of the prepping solution back to take advantage of its long term bactericidal properties. The pump was interrogated and programmed to reflect the correct medication, volume, and dosage. The program was printed and taken to the physician for approval. Once checked and signed by the physician, a copy was provided to the patient and another scanned into the EMR. Vitals:   07/02/19 1150  BP: 112/81  Pulse: (!) 107  Resp: 16  Temp: 98.4 F (36.9 C)  TempSrc: Oral  SpO2: 98%  Weight: 165 lb (74.8 kg)  Height: '5\' 7"'  (1.702 m)    Start Time: 1213 hrs. End Time: 1221 hrs. Materials & Medications: Medtronic Refill Kit Medication(s): Please see chart orders for details.  Imaging Guidance:          Type of Imaging Technique: None used Indication(s): N/A Exposure Time: No patient exposure Contrast: None used. Fluoroscopic Guidance: N/A Ultrasound Guidance: N/A Interpretation: N/A  Antibiotic Prophylaxis:   Anti-infectives (From admission, onward)   None     Indication(s): None identified  Post-operative Assessment:  Post-procedure Vital Signs:  Pulse/HCG Rate: (!) 107  Temp: 98.4 F (36.9 C) Resp: 16 BP: 112/81 SpO2: 98 %  EBL: None  Complications: No immediate post-treatment complications observed by team, or reported by patient.  Note: The patient tolerated the entire procedure well. A repeat set of vitals were taken after the procedure and the patient  was kept under observation following institutional policy, for this type of procedure. Post-procedural neurological assessment was performed, showing return to baseline, prior to discharge. The patient was provided with post-procedure discharge instructions, including a section on how to identify potential problems. Should any problems arise concerning this procedure, the patient was given instructions to immediately contact us, at any  time, without hesitation. In any case, we plan to contact the patient by telephone for a follow-up status report regarding this interventional procedure.  Comments:  No additional relevant information.  Plan of Care  Orders:  Orders Placed This Encounter  Procedures  . PUMP REFILL    Maintain Protocol by having two(2) healthcare providers during procedure and programming.    Scheduling Instructions:     Please refill intrathecal pump today.    Order Specific Question:   Where will this procedure be performed?    Answer:   [facility]  . PUMP REFILL    Whenever possible schedule on a procedure today.    Standing Status:   Future    Standing Expiration Date:   11/29/2019    Scheduling Instructions:     Please schedule intrathecal pump refill based on pump programming. Avoid schedule intervals of more than 120 days (4 months).    Order Specific Question:   Where will this procedure be performed?    Answer:   [facility]  . Informed Consent Details: Physician/Practitioner Attestation; Transcribe to consent form and obtain patient signature    Consent Attestation: I, the ordering provider, attest that I have discussed with the patient the benefits, risks, side-effects, alternatives, likelihood of achieving goals, and potential problems during recovery for the procedure that I have provided informed consent.    Scheduling Instructions:     Procedure: Intrathecal Pump Refill     Attending Physician: Beatriz Chancellor A. Dossie Arbour, MD     Indications:  Chronic Pain Syndrome (G89.4)   Chronic Opioid Analgesic:  Intrathecal PF-Fentanyl 502.2 mcg/day (20.9 mcg/hr) MME/day: 50.16 mg/day.   Medications ordered for procedure: Meds ordered this encounter  Medications  . baclofen (LIORESAL) 10 MG tablet    Sig: Take 1 tablet (10 mg total) by mouth 2 (two) times daily.    Dispense:  180 tablet    Refill:  3    Fill one day early if pharmacy is closed on scheduled refill date. May substitute for generic if available.   Medications administered: Lexine Baton "Rick" had no medications administered during this visit.  See the medical record for exact dosing, route, and time of administration.  Follow-up plan:   Return for Pump Refill (Max:69mo.       Interventional management options:  Considering:   Palliative intrathecal pump refills  No other procedures currently under consideration   Palliative PRN treatment(s):   Palliative management of intrathecal pump     Recent Visits No visits were found meeting these conditions.  Showing recent visits within past 90 days and meeting all other requirements   Today's Visits Date Type Provider Dept  07/02/19 Procedure visit NMilinda Pointer MD [facility]  Showing today's visits and meeting all other requirements   Future Appointments No visits were found meeting these conditions.  Showing future appointments within next 90 days and meeting all other requirements   Disposition: Discharge home  Discharge Date & Time: 07/02/2019; 1235 hrs.   Primary Care Physician: FSofie Hartigan MD [facility] Note by: FGaspar Cola MD Date: 07/02/2019; Time: 12:42 PM  Disclaimer:  Medicine is not an eChief Strategy Officer The only guarantee in medicine is that nothing is guaranteed. It is important to note that the decision to proceed with this intervention was based on the information collected from the patient. The Data and conclusions were  drawn from the patient's questionnaire, the interview, and the physical examination.  Because the information was provided in large part by the patient, it cannot be guaranteed that it has not been purposely or unconsciously manipulated. Every effort has been made to obtain as much relevant data as possible for this evaluation. It is important to note that the conclusions that lead to this procedure are derived in large part from the available data. Always take into account that the treatment will also be dependent on availability of resources and existing treatment guidelines, considered by other Pain Management Practitioners as being common knowledge and practice, at the time of the intervention. For Medico-Legal purposes, it is also important to point out that variation in procedural techniques and pharmacological choices are the acceptable norm. The indications, contraindications, technique, and results of the above procedure should only be interpreted and judged by a Board-Certified Interventional Pain Specialist with extensive familiarity and expertise in the same exact procedure and technique.

## 2019-07-03 ENCOUNTER — Telehealth: Payer: Self-pay | Admitting: *Deleted

## 2019-07-03 NOTE — Telephone Encounter (Signed)
No problems after pump refill. Wife states doing well.

## 2019-07-09 ENCOUNTER — Encounter: Payer: Medicare Other | Admitting: Pain Medicine

## 2019-07-10 ENCOUNTER — Other Ambulatory Visit: Payer: Self-pay | Admitting: Hematology and Oncology

## 2019-07-10 DIAGNOSIS — C9001 Multiple myeloma in remission: Secondary | ICD-10-CM

## 2019-07-10 NOTE — Progress Notes (Signed)
[facility]  [address] [place], [place] [address] Phone: [phone]  Fax: [phone]   Clinic Day:  07/15/2019  Referring physician: Sofie Hartigan, MD  Chief Complaint: Logan Perez is a 73 y.o. 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  HPI: The patient was last seen in the medical oncology clinic on 06/17/2019.  At that time, he was doing well.  He had some restless legs which appeared to improve with stretching. He was s/p dental extraction x 2.  Exam was stable. Hematocrit 36.2. Hemoglobin 12.5. MCV 100.8. M-spike was 0.1 gm/dL. Patient received daratumumab. He continued Eliquis 5 mg BID.   Patient had a pain pump refill with Dr. NDossie Arbouron 07/02/2019. He will follow-up in 3 months.   During the interim, he has done well. He has no complaints. He notes normal bowels. He has no diarrhea. He reports having pseudohallucinations at night. He notes last night he saw someone standing over him about to back hand him. He closed his eyes and the image went away. He notes the hallucinations happen once or twice a week. The hallucinations started after stopping Xanax. He does not have a neurologist.   He notes a knot on his right hip that he been there for a few years. Recently he noticed pain when pressure is applied to the knot. He notes that the knot has not grown in size.    Past Medical History:  Diagnosis Date  . Anxiety   . Atrial fibrillation ([place]   . BPH (benign prostatic hyperplasia)   . Complication of anesthesia    BAD HEADACHE NIGHT OF FIRST CATARACT  . Compression fracture of lumbar vertebra ([place]   . Difficulty voiding   . Dysrhythmia    A FIB  . Elevated PSA   . GERD (gastroesophageal reflux disease)   . Hearing aid worn    bilateral  . History of kidney  stones   . HLD (hyperlipidemia)   . HOH (hard of hearing)   . Hypertension   . Multiple myeloma ([place]   . Neuropathy    feet. 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  . Pain    BACK  . Palpitations   . Pneumonia   . Pulmonary embolism ([place]   . Sepsis ([place]   . Stroke ([facility]    TIA, detected on CT scan. pt was unaware    Past Surgical History:  Procedure Laterality Date  . BACK SURGERY  1994  . CATARACT EXTRACTION W/PHACO Right 05/02/2016   Procedure: CATARACT EXTRACTION PHACO AND INTRAOCULAR LENS PLACEMENT (IOC);  Surgeon: WBirder Robson MD;  Location: [facility] ORS;  Service: Ophthalmology;  Laterality: Right;  [place]1.06 AP% 20.6 CDE 13.70 FLUID PACK LOT # [id number]  . CATARACT EXTRACTION W/PHACO Left 05/16/2016   Procedure: CATARACT EXTRACTION PHACO AND INTRAOCULAR LENS PLACEMENT (IOC);  Surgeon: WBirder Robson MD;  Location: [facility] ORS;  Service: Ophthalmology;  Laterality: Left;  [place]01:43 AP% 19.8 CDE 20.45 FLUID PACK LOT [id number]  . COLONOSCOPY WITH PROPOFOL N/A 03/22/2017   Procedure: COLONOSCOPY WITH PROPOFOL;  Surgeon: WLucilla Lame MD;  [place]  Service: Endoscopy;  Laterality: N/A;  has port  . ESOPHAGOGASTRODUODENOSCOPY (EGD) WITH PROPOFOL  03/22/2017   Procedure: ESOPHAGOGASTRODUODENOSCOPY (EGD) WITH PROPOFOL;  Surgeon: WLucilla Lame MD;  [place]  Service: Endoscopy;;  .  EYE SURGERY    . KNEE ARTHROSCOPY Left 1992  . LIMBAL STEM CELL TRANSPLANT    . PAIN PUMP IMPLANTATION  2012  . PAIN PUMP IMPLANTATION N/A 09/04/2017   Procedure: INTRATHECAL PUMP BATTERY CHANGE;  Surgeon: Milinda Pointer, MD;  Location: [facility] ORS;  Service: Neurosurgery;  Laterality: N/A;  . PORTA CATH INSERTION N/A 12/04/2016   Procedure: Glori Luis Cath Insertion;  Surgeon: Algernon Huxley, MD;  Location: [place] [facility];  Service: Cardiovascular;  Laterality: N/A;  . stem cell implant  2008   [facility]    Family History  Problem Relation Age of Onset  . Cancer Father         throat  . Kidney disease Sister   . Stroke Other   . Stroke Mother   . Bladder Cancer Neg Hx   . Prostate cancer Neg Hx   . Kidney cancer Neg Hx     Social History:  reports that he quit smoking about 43 years ago. His smoking use included cigarettes. He has a 30.00 pack-year smoking history. He has never used smokeless tobacco. He reports that he does not drink alcohol or use drugs.  He has a wire haired dachshund. He lives in [place]. His wife's name is [name]. The patient is alone today.  Allergies:  Allergies  Allergen Reactions  . Azithromycin Diarrhea and Other (See Comments)    Possible cause of C. Diff Possible cause of C. Diff Possible cause of C. Diff Possible cause of C. Diff  . Bee Pollen Other (See Comments)    Sneezing, watery eyes, runny nose  . Pollen Extract Other (See Comments)    Sneezing, watery eyes, runny nose  . 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 Rivaroxaban Rash    Current Medications: Current Outpatient Medications  Medication Sig Dispense Refill  . acetaminophen (TYLENOL) 500 MG tablet Take 500 mg by mouth 2 (two) times a day.    . baclofen (LIORESAL) 10 MG tablet Take 1 tablet (10 mg total) by mouth 2 (two) times daily. 180 tablet 3  . bismuth subsalicylate (PEPTO BISMOL) 262 MG/15ML suspension Take 30 mLs by mouth every 6 (six) hours as needed for diarrhea or loose stools.    . Calcium Carb-Cholecalciferol (CALCIUM-VITAMIN D) 500-400 MG-UNIT TABS Take 1 tablet daily by mouth.    . cetirizine (ZYRTEC) 10 MG tablet Take 10 mg by mouth daily as needed for allergies.     . Cholecalciferol (VITAMIN D3) 2000 units capsule Take 2,000 Units daily by mouth.     . Daratumumab (DARZALEX IV) Inject 1,200 mg every 30 (thirty) days into the vein.     [name] dexamethasone (DECADRON) 4 MG tablet TAKE 4 TABLETS BY MOUTH 1 HOUR PRIOR TO INFUSION, THEN 1 TABLET ON DAYS 2 AND 3 AS DIRECTED 30 tablet 0  .  diltiazem (CARDIZEM CD) 120 MG 24 hr capsule Take 120 mg daily by mouth.     . diltiazem (CARDIZEM) 60 MG tablet Take 60 mg daily as needed by mouth (for increased heart rate > 140).     [name] diphenhydrAMINE (BENADRYL) 25 MG tablet Take 25 mg by mouth as directed. With chemo treatment    . ELIQUIS 5 MG TABS tablet TAKE 1 TABLET BY MOUTH TWICE DAILY 60 tablet 0  . fluticasone (FLONASE) 50 MCG/ACT nasal spray Place 1 spray into the nose daily as needed for allergies.     [name]  Hypromellose (ARTIFICIAL TEARS OP) Place 1 drop as needed into both eyes (for dry eyes).    [name] loperamide (IMODIUM) 2 MG capsule Take 4 mg as needed by mouth for diarrhea or loose stools.     [name] loratadine (CLARITIN) 10 MG tablet Take 10 mg by mouth daily as needed.     . lovastatin (MEVACOR) 20 MG tablet Take 20 mg by mouth every evening.     . montelukast (SINGULAIR) 10 MG tablet TAKE 1 TABLET BY MOUTH DAILY THE DAY BEFORE THE INFUSION, THEN DAY OF AND THE 2 DAYS AFTER INFUSION 30 tablet 0  . Multiple Vitamins-Iron (MULTIVITAMIN/IRON PO) Take 1 tablet daily by mouth.    . NON FORMULARY IT pump  Fentanyl 1,500.0 mcg/ml Bupivicaine 30.0 mg/ml Clonidine 300.0 mcg/ml Rate 502.2 mcg/day    . omeprazole (PRILOSEC) 20 MG capsule Take 20 mg by mouth daily.     . ondansetron (ZOFRAN) 8 MG tablet Take 1 tablet (8 mg total) by mouth as directed. Take with chemo and can take it as needed 20 tablet 1  . potassium chloride SA (K-DUR,KLOR-CON) 20 MEQ tablet TAKE 1 TABLET BY MOUTH DAILY 90 tablet 0  . tamsulosin (FLOMAX) 0.4 MG CAPS capsule TAKE 1 CAPSULE(0.4 MG) BY MOUTH DAILY 90 capsule 4  . valACYclovir (VALTREX) 500 MG tablet TAKE 1 TABLET BY MOUTH DAILY 30 tablet 3  . vitamin B-12 (CYANOCOBALAMIN) 1000 MCG tablet Take 1,000 mcg daily by mouth.     [name] PAIN MANAGEMENT IT PUMP REFILL 1 each by Intrathecal route once for 1 dose. Medication: PF Fentanyl 1,500.0 mcg/ml PF Bupivicaine 30.0 mg/ml PF Clonidine 300.0 mcg/ml Total Volume: 40 ml  Needed by 12-10-2018 @ 1000 1 each 0  . PAIN MANAGEMENT IT PUMP REFILL 1 each by Intrathecal route once for 1 dose. Medication: PF Fentanyl 1,500.0 mcg/ml PF Bupivicaine 30.0 mg/ml PF Clonidine 300.mcg/ml Total Volume: 29m Needed by 07-02-19 @ 1000 1 each 0   No current facility-administered medications for this visit.    Facility-Administered Medications Ordered in Other Visits  Medication Dose Route Frequency Provider Last Rate Last Dose  . heparin lock flush 100 unit/mL  500 Units Intravenous Once Corcoran, Melissa C, MD      . sodium chloride flush (NS) 0.9 % injection 10 mL  10 mL Intravenous PRN CLequita Asal MD   10 mL at 07/15/19 04503   Review of Systems  Constitutional: Positive for weight loss (3 pounds). Negative for chills, diaphoresis, fever and malaise/fatigue.       Doing well.  HENT: Negative.  Negative for congestion, ear discharge, ear pain, nosebleeds, sinus pain, sore throat and tinnitus.        S/p dental extraction x 2; additional extraction planned.  Eyes: Negative.  Negative for blurred vision, double vision, photophobia and pain.  Respiratory: Negative.  Negative for cough, hemoptysis, sputum production, shortness of breath and wheezing.   Cardiovascular: Negative.  Negative for chest pain, palpitations, orthopnea, leg swelling and PND.       H/o atrial fibrillation.  Gastrointestinal: Negative.  Negative for abdominal pain, blood in stool, constipation, diarrhea, heartburn, melena, nausea and vomiting.       Normal bowels.  Genitourinary: Negative.  Negative for dysuria, frequency, hematuria and urgency.  Musculoskeletal: Negative for back pain (chronic; Fentanyl/bupivacaine/clonidine pump), falls, joint pain, myalgias and neck pain.       Restless legs.  Skin: Negative.  Negative for itching and rash.  Neurological: Negative for dizziness, tremors, sensory  change (neuropathy in toes, stable), speech change, focal weakness, weakness and headaches.   Endo/Heme/Allergies: Negative.  Negative for environmental allergies. Does not bruise/bleed easily.  Psychiatric/Behavioral: Negative for depression and memory loss. The patient is not nervous/anxious and does not have insomnia.        Pseudo-hallucinations at night.  All other systems reviewed and are negative.  Performance status (ECOG):  1  Vitals Blood pressure 128/77, pulse 94, temperature (!) 97 F (36.1 C), temperature source Tympanic, resp. rate 18, height '5\' 7"'  (1.702 m), weight 162 lb 14.7 oz (73.9 kg), SpO2 99 %.  Physical Exam  Constitutional: He is oriented to person, place, and time. He appears well-developed and well-nourished. No distress.  HENT:  Head: Normocephalic and atraumatic.  Mouth/Throat: Oropharynx is clear and moist. No oropharyngeal exudate.  Short brown hair with graying. Goatee.  Mask.   Eyes: Pupils are equal, round, and reactive to light. Conjunctivae and EOM are normal. No scleral icterus.  Glasses. Blue eyes.  Neck: Normal range of motion. Neck supple. No JVD present.  Cardiovascular: Normal rate, regular rhythm and normal heart sounds. Exam reveals no gallop.  No murmur heard. Pulmonary/Chest: Effort normal and breath sounds normal. No respiratory distress. He has no wheezes. He has no rales.  Abdominal: Soft. Bowel sounds are normal. He exhibits no distension and no mass. There is no abdominal tenderness. There is no rebound and no guarding.  Right sided implanted Fentanyl/bupivacaine/clonidine pump.   Musculoskeletal: Normal range of motion.        General: No tenderness or edema.     Comments: Right hip has a fingertip size subcutaneous mobile cyst located in the upper lateral femur.  Lymphadenopathy:       Head (right side): No preauricular, no posterior auricular and no occipital adenopathy present.       Head (left side): No preauricular, no posterior auricular and no occipital adenopathy present.    He has no cervical adenopathy.    He has no  axillary adenopathy.       Right: No inguinal and no supraclavicular adenopathy present.       Left: No inguinal and no supraclavicular adenopathy present.  Neurological: He is alert and oriented to person, place, and time.  Skin: Skin is warm and dry. No rash noted. He is not diaphoretic. No erythema. No pallor.  Psychiatric: He has a normal mood and affect. His behavior is normal. Judgment and thought content normal.  Nursing note and vitals reviewed.   Infusion on 07/15/2019  Component Date Value Ref Range Status  . Magnesium 07/15/2019 1.9  1.7 - 2.4 mg/dL Final   Performed at [facility], [address] [place], [place] [address]  . WBC 07/15/2019 8.1  4.0 - 10.5 K/uL Final  . RBC 07/15/2019 3.46* 4.22 - 5.81 MIL/uL Final  . Hemoglobin 07/15/2019 12.1* 13.0 - 17.0 g/dL Final  . HCT 07/15/2019 35.3* 39.0 - 52.0 % Final  . MCV 07/15/2019 102.0* 80.0 - 100.0 fL Final  . MCH 07/15/2019 35.0* 26.0 - 34.0 pg Final  . MCHC 07/15/2019 34.3  30.0 - 36.0 g/dL Final  . RDW 07/15/2019 13.5  11.5 - 15.5 % Final  . Platelets 07/15/2019 195  150 - 400 K/uL Final  . nRBC 07/15/2019 0.0  0.0 - 0.2 % Final  . Neutrophils Relative % 07/15/2019 73  % Final  . Neutro Abs 07/15/2019 5.8  1.7 - 7.7 K/uL Final  . Lymphocytes Relative 07/15/2019 18  %  Final  . Lymphs Abs 07/15/2019 1.5  0.7 - 4.0 K/uL Final  . Monocytes Relative 07/15/2019 7  % Final  . Monocytes Absolute 07/15/2019 0.6  0.1 - 1.0 K/uL Final  . Eosinophils Relative 07/15/2019 1  % Final  . Eosinophils Absolute 07/15/2019 0.1  0.0 - 0.5 K/uL Final  . Basophils Relative 07/15/2019 0  % Final  . Basophils Absolute 07/15/2019 0.0  0.0 - 0.1 K/uL Final  . Immature Granulocytes 07/15/2019 1  % Final  . Abs Immature Granulocytes 07/15/2019 0.04  0.00 - 0.07 K/uL Final   Performed at [facility], [address] [place], [place] [address]  . Sodium 07/15/2019 135  135 - 145 mmol/L Final  . Potassium 07/15/2019  4.1  3.5 - 5.1 mmol/L Final  . Chloride 07/15/2019 103  98 - 111 mmol/L Final  . CO2 07/15/2019 24  22 - 32 mmol/L Final  . Glucose, Bld 07/15/2019 123* 70 - 99 mg/dL Final  . BUN 07/15/2019 19  8 - 23 mg/dL Final  . Creatinine, Ser 07/15/2019 1.49* 0.61 - 1.24 mg/dL Final  . Calcium 07/15/2019 8.8* 8.9 - 10.3 mg/dL Final  . Total Protein 07/15/2019 6.4* 6.5 - 8.1 g/dL Final  . Albumin 07/15/2019 3.9  3.5 - 5.0 g/dL Final  . AST 07/15/2019 17  15 - 41 U/L Final  . ALT 07/15/2019 15  0 - 44 U/L Final  . Alkaline Phosphatase 07/15/2019 48  38 - 126 U/L Final  . Total Bilirubin 07/15/2019 0.6  0.3 - 1.2 mg/dL Final  . GFR calc non Af Amer 07/15/2019 46* >60 mL/min Final  . GFR calc Af Amer 07/15/2019 53* >60 mL/min Final  . Anion gap 07/15/2019 8  5 - 15 Final   Performed at [facility], [address], [place], [place] [address]    Assessment:  Logan Perez is a 72 y.o. male with stage III mutiple myeloma. He initially presented with progressive back pain beginning in 12/2006. MRI revealed "spots and compression fractures". He began Velcade, thalidomide, and Decadron. In 08/2007, he underwent high dose chemotherapy and autologous stem cell transplant. 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  He recurred with a rising M-spike (2.7) with repeat M spike (1.7 gm/dl) in 03/2010. He was initially treated with Velcade (02/08/2010 - 05/10/2010). He then began Revlimid (15 mg 3 weeks on/1 week off) and Decadron (40 mg on day 1, 8, 15, 22). Because of significant side effect with Decadron his dose was decreased to 10 mg once a week in 07/2010.   He was on maintenance Revlimid. Revlimid was initially10 mg 3 weeks on/1 week off. This was changed to 10 mg 2 weeks on/2 weeks off secondary to right nipple tenderness. His dose was increased to 10 mg 3 weeks on/1 week off with Decadron 10 mg a week (on Sundays) and then Revlamid 15 mg 3 weeks on and 1  week off with Decadron on Sundays. He began Pomalyst4 mg 3 weeks on/1 week off with Decadron on 08/27/2015.  Over the past year his SPEPhas revealed no monoclonal protein (04/21/2015) and 0.5 gm/dL on 09/22/2015 and 10/20/2015. M spikewas 0.1 on 02/02/2016, 03/01/2016, 03/29/2016, 04/26/2016, and 05/24/2016. M spikewas 0 on 10/11/2016, 11/28/2016, 02/05/2017, 03/21/2017, 08/02/2017, 10/04/2017, 12/27/2017, 01/24/2018, and 02/21/2018. M spikewas 0.1 on 11/01/2017, 0.1 on 11/29/2017, 0 on 12/27/2017, 0 on 01/24/2018, 0 on 02/21/2018, 0.1 on 03/21/2018, 0.3 on 04/18/2018, 0.1 on 05/16/2018, 0 on 06/13/2018, 0.2 on  08/08/2018, 0 on 09/09/2018, 0.1 on 11/04/2018, 0 on 12/02/2018, 0.1 on 12/30/2018, 0 on 01/27/2019, 0.1 on 02/24/2019, 0.1 on 03/24/2019, 0.1 on 04/23/2019, 0 on 05/19/2019, 0.1 on 06/17/2019, and 0.2 on 07/15/2019.  Free light chainshave been monitored. Kappa free light chainswere 18.54 on 11/21/2013, 18.37 on 02/20/2014, 18.93 on 05/22/2014, 32.58 (high; normal ratio 1.27) on 08/21/2014, 51.53 (high; elevated ratio of 2.12) on 11/13/2014, 28.08 (ratio 1.73) on 12/09/2014, 23.71 (ratio 2.17) on 01/18/2015, 92.93 (ratio 9.49) on 04/21/2015, 93.44 (ratio 10.28) on 05/26/2015, 255.45 (ratio of 24.05) on 07/14/2015, 373.89 (ratio 48.31) on 08/04/2015, 474.33 (ratio 70.58) on 08/25/2015, 450.76 (ratio 55.44) on 09/22/2015, 453.4 (ratio 59.89) on 10/20/2015, 58.26 (ratio of >40.74) on 02/02/2016, 62.67 (ratio of 37.98) on 03/01/2016, 75.7 (ratio >50.47) on 03/29/2016, 79.7 (ratio 53.13) on 04/26/2016, 108.6 (ratio >72.4) on 05/24/2016, 8.3 (1.46 ratio) on 10/11/2016, 6.7 (0.81 ratio) on 02/05/2017, 7.8 (1.01 ratio) on 03/21/2017,7.6 (ratio 0.63) on 06/01/2017, 8.1 (ratio 1.13) on 11/29/2017, 9.6 (ratio 1.55) on 06/13/2018, 6.6 (ratio 2.06) on 07/11/2018, 6.9 (ratio 0.82) on 08/08/2018, 5.8 (ratio 1.66) on 09/09/2018, 5.8 (ratio 1.05) on 11/04/2018, 5.6(ratio 2.55)on 12/02/2018, 5.5 (ratio  1.96) on 01/27/2019, and 7.6 (ratio 2.38) on 06/17/2019.  Bone surveyon 12/08/2014 was stable. Bone survey on 10/21/2015 revealed increase conspicuity of subcentimeter lytic lesions in the calvarium.  Bone marrow aspirate and biopsyon 11/04/2015 revealed an atypical monoclonal plasma cells estimated at 30-40% of marrow cells. Marrow was variably cellular (approximately 45%) with background trilineage hematopoiesis. There was no significant increase in marrow reticulin fibers. Storage iron was present.   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 He develoed herpes zoster in 04/2008. He developed a pulmonary embolismin 05/2013. He was initially on Xarelto, but is now on Eliquis. He had an episode of pneumonia around this time requiring a brief admission. He developed severe lower leg cramps on 08/07/2014 secondary to hypokalemia. Duplex was negative.   He was treated forC difficile colitis(Flagyl completed 07/30/2015). He has a chronic indwelling pain pump.  He received 4 cycles of Pomalyst and Decadron(08/27/2015 - 11/19/2015). Restaging studies document progressive disease. Kappa free light chains are increasing. SPEP revealed 0.5 gm/dL monoclonal protein then 1.3 gm/dL. Bone survey reveals increase conspicuity of subcentimeter lytic lesions in the calvarium. Bone marrow reveals 30-40% plasma cells.   MUGAon 11/30/2015 revealed an ejection fraction of 46%. He is felt not to be a good candidate for Kyprolis. There were no focal wall motion abnormalities. He had a stress echo less than 1 year ago. He has a history of PVCs and atrial fibrillation. He takes Cardizem prn.  He received 17 weeks of daratumumab(Darzalex) (12/09/2015 - 05/25/2016). He tolerated treatment well without side effect.  Bone marrow on 02/09/2016 revealed no diagnostic morphologic evidence of plasma cell myeloma. Marrow was normocellular to  hypocellular marrow for age (ranging from 10-40%) with maturing trilineage hematopoiesis and mild multilineage dyspoiesis. There was patchy mild increase in reticulin. Storage iron was present. Flow cytometry revealed no definitive evidence of monoclonality. There was a non-specific atypical myeloid and monocytic findings with no increase in blasts. Cytogenetics were normal (46, XY).  He is currently49month s/p 2nd autologous stem cell transplanton 06/16/2016. 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 He also experienced atrial fibrillation with intermittent episodes of RVR requiring IV beta blockers. He is on prophylactic valacyclovir for 1 year post transplant.  He started vaccinations(DTaP-Pediatric triple vaccine, Hep B- Pediatrix triple vaccine, Haemophilus influenza  B (Hib), inactivated polio virus (IPV), pneumococcal conjugate vaccine 13-valent (PCV 13)) on 04/17/2017. Recommendation was for Shingrix vaccine to be given in Reader and second dose of Shingrix in 2 months. He had follow-up vaccinations on 08/28/2017. He had his second shingles vaccine. He received 18 month vaccinations- DTaP-Pediatric triple vaccine, Hep B- Pediatrix triple vaccine, Haemophilus influenza B (Hib), inactivated polio virus (IPV), pneumococcal conjugate vaccine 13-valent (PCV 13) on 02/05/2018.  He is s/p32cycles ofdaratumumab(Darzalex) post transplant(12/05/2016 - 06/17/2019).  PET scanon 08/14/2018 revealed no evidence of active myeloma on whole-body FDG PET scan. There was no evidence soft tissue plasmacytoma. There was no clear evidence of lytic lesions on the CT portion exam. Some lucencies in the pelvis were stable. There were chronic compression fractures in the lower thoracic spine.  He has a history of hypomagnesemiathat required IV magnesium (2-4 gm) weekly. He has not required magnesium since 10/24/2016. Patient has atrial  fibrillationand is on Eliquis.  Symptomatically, he is doing well.  He describes pseudo-hallucinations at night 1-2 x/week.  Exam is unremarkable.  Plan: 1.   Labs today: CBC with diff, CMP, Mg, SPEP. 2.Multiple myeloma Clinically, he is doing well. M spike continues to fluctuate between 0 - 0.2 gm/dL over the past several months. M-spike is 0.2 today.  Labs reviewed.  Blood count stable as well as electrolytes for treatment.   Confirm patient took his premeds prior to clinic.   Cycle #33 daratumumab today. Continue Valacyclovir prophylaxis until 3 months s/p daratumumab. Patient is no longer followed in the transplant center. Continue periodic phone follow-up with Dr. Adriana Sim[facility] at [facility]. Discuss symptom management.  He has antiemetics and pain medications at home to use on a prn bases.  Interventions are adequate.    3. Macrocytic anemia Hematocrit35.3. Hemoglobin12.1. MCV102.0. B12, folate and TSH were normal in04/20/2020. Check labs at next visit. 4.Neuropathy Patient has a stable grade I neuropathy.  Continue to monitor. 5.Chronic diarrhea Patient has done well this month. He describes normal bowels and no diarrhea. Continue to monitor. 6.History of pulmonary embolism(2014) Continue Eliquis. 7.   Pseudo-hallucinations  Etiology unclear.  Patient notes timing related to discontinuation of Xanax although this was > 1 month ago.  Etiology may be related to Baclofen.  Encourage patient to follow-up with Dr. Dossie Arbour. 8.   RTC in 1 month for MD assessment, labs (CBC with diff, CMP, Mg, SPEP, FLCA) and cycle #34 daratumumab   I discussed the assessment and treatment plan with the patient.  The patient was provided an opportunity to ask questions and all were answered.  The patient agreed with the plan and demonstrated an understanding of the instructions.  The patient was advised to call back if the  symptoms worsen or if the condition fails to improve as anticipated.   Lequita Asal, MD, PhD    07/15/2019, 10:16 AM  I, Selena Batten, am acting as scribe for [employer]. Mike Gip, MD, PhD.  I, Melissa C. Mike Gip, MD, have reviewed the above documentation for accuracy and completeness, and I agree with the above.

## 2019-07-14 NOTE — Progress Notes (Signed)
Confirmed Name, DOB, and Address. Denies any concerns at this time.  

## 2019-07-15 ENCOUNTER — Other Ambulatory Visit: Payer: Self-pay

## 2019-07-15 ENCOUNTER — Encounter: Payer: Self-pay | Admitting: Hematology and Oncology

## 2019-07-15 ENCOUNTER — Inpatient Hospital Stay: Payer: Medicare Other

## 2019-07-15 ENCOUNTER — Inpatient Hospital Stay: Payer: Medicare Other | Attending: Hematology and Oncology | Admitting: Hematology and Oncology

## 2019-07-15 VITALS — BP 128/77 | HR 94 | Temp 97.0°F | Resp 18 | Ht 67.0 in | Wt 162.9 lb

## 2019-07-15 VITALS — BP 139/84 | HR 77 | Resp 18

## 2019-07-15 DIAGNOSIS — Z7901 Long term (current) use of anticoagulants: Secondary | ICD-10-CM | POA: Diagnosis not present

## 2019-07-15 DIAGNOSIS — D539 Nutritional anemia, unspecified: Secondary | ICD-10-CM

## 2019-07-15 DIAGNOSIS — R197 Diarrhea, unspecified: Secondary | ICD-10-CM

## 2019-07-15 DIAGNOSIS — Z86711 Personal history of pulmonary embolism: Secondary | ICD-10-CM | POA: Insufficient documentation

## 2019-07-15 DIAGNOSIS — Z5112 Encounter for antineoplastic immunotherapy: Secondary | ICD-10-CM

## 2019-07-15 DIAGNOSIS — C9000 Multiple myeloma not having achieved remission: Secondary | ICD-10-CM | POA: Diagnosis not present

## 2019-07-15 DIAGNOSIS — C9001 Multiple myeloma in remission: Secondary | ICD-10-CM

## 2019-07-15 DIAGNOSIS — Z9484 Stem cells transplant status: Secondary | ICD-10-CM | POA: Diagnosis not present

## 2019-07-15 DIAGNOSIS — I2782 Chronic pulmonary embolism: Secondary | ICD-10-CM | POA: Diagnosis not present

## 2019-07-15 DIAGNOSIS — C9002 Multiple myeloma in relapse: Secondary | ICD-10-CM

## 2019-07-15 LAB — COMPREHENSIVE METABOLIC PANEL
ALT: 15 U/L (ref 0–44)
AST: 17 U/L (ref 15–41)
Albumin: 3.9 g/dL (ref 3.5–5.0)
Alkaline Phosphatase: 48 U/L (ref 38–126)
Anion gap: 8 (ref 5–15)
BUN: 19 mg/dL (ref 8–23)
CO2: 24 mmol/L (ref 22–32)
Calcium: 8.8 mg/dL — ABNORMAL LOW (ref 8.9–10.3)
Chloride: 103 mmol/L (ref 98–111)
Creatinine, Ser: 1.49 mg/dL — ABNORMAL HIGH (ref 0.61–1.24)
GFR calc Af Amer: 53 mL/min — ABNORMAL LOW (ref >60)
GFR calc non Af Amer: 46 mL/min — ABNORMAL LOW (ref >60)
Glucose, Bld: 123 mg/dL — ABNORMAL HIGH (ref 70–99)
Potassium: 4.1 mmol/L (ref 3.5–5.1)
Sodium: 135 mmol/L (ref 135–145)
Total Bilirubin: 0.6 mg/dL (ref 0.3–1.2)
Total Protein: 6.4 g/dL — ABNORMAL LOW (ref 6.5–8.1)

## 2019-07-15 LAB — CBC WITH DIFFERENTIAL/PLATELET
Abs Immature Granulocytes: 0.04 10*3/uL (ref 0.00–0.07)
Basophils Absolute: 0 10*3/uL (ref 0.0–0.1)
Basophils Relative: 0 %
Eosinophils Absolute: 0.1 10*3/uL (ref 0.0–0.5)
Eosinophils Relative: 1 %
HCT: 35.3 % — ABNORMAL LOW (ref 39.0–52.0)
Hemoglobin: 12.1 g/dL — ABNORMAL LOW (ref 13.0–17.0)
Immature Granulocytes: 1 %
Lymphocytes Relative: 18 %
Lymphs Abs: 1.5 10*3/uL (ref 0.7–4.0)
MCH: 35 pg — ABNORMAL HIGH (ref 26.0–34.0)
MCHC: 34.3 g/dL (ref 30.0–36.0)
MCV: 102 fL — ABNORMAL HIGH (ref 80.0–100.0)
Monocytes Absolute: 0.6 10*3/uL (ref 0.1–1.0)
Monocytes Relative: 7 %
Neutro Abs: 5.8 10*3/uL (ref 1.7–7.7)
Neutrophils Relative %: 73 %
Platelets: 195 10*3/uL (ref 150–400)
RBC: 3.46 MIL/uL — ABNORMAL LOW (ref 4.22–5.81)
RDW: 13.5 % (ref 11.5–15.5)
WBC: 8.1 10*3/uL (ref 4.0–10.5)
nRBC: 0 % (ref 0.0–0.2)

## 2019-07-15 LAB — MAGNESIUM: Magnesium: 1.9 mg/dL (ref 1.7–2.4)

## 2019-07-15 MED ORDER — SODIUM CHLORIDE 0.9 % IV SOLN
Freq: Once | INTRAVENOUS | Status: AC
  Administered 2019-07-15: 11:00:00 via INTRAVENOUS
  Filled 2019-07-15: qty 250

## 2019-07-15 MED ORDER — HEPARIN SOD (PORK) LOCK FLUSH 100 UNIT/ML IV SOLN
500.0000 [IU] | Freq: Once | INTRAVENOUS | Status: AC
  Administered 2019-07-15: 500 [IU] via INTRAVENOUS

## 2019-07-15 MED ORDER — SODIUM CHLORIDE 0.9 % IV SOLN
16.2000 mg/kg | Freq: Once | INTRAVENOUS | Status: AC
  Administered 2019-07-15: 1200 mg via INTRAVENOUS
  Filled 2019-07-15: qty 60

## 2019-07-15 MED ORDER — SODIUM CHLORIDE 0.9% FLUSH
10.0000 mL | INTRAVENOUS | Status: DC | PRN
  Administered 2019-07-15: 10 mL via INTRAVENOUS
  Filled 2019-07-15: qty 10

## 2019-07-15 NOTE — Progress Notes (Signed)
No new changes noted today 

## 2019-07-15 NOTE — Patient Instructions (Signed)
Daratumumab injection What is this medicine? DARATUMUMAB (dar a toom ue mab) is a monoclonal antibody. It is used to treat multiple myeloma. This medicine may be used for other purposes; ask your health care provider or pharmacist if you have questions. COMMON BRAND NAME(S): DARZALEX What should I tell my health care provider before I take this medicine? They need to know if you have any of these conditions:  infection (especially a virus infection such as chickenpox, herpes, or hepatitis B virus)  lung or breathing disease  an unusual or allergic reaction to daratumumab, other medicines, foods, dyes, or preservatives  pregnant or trying to get pregnant  breast-feeding How should I use this medicine? This medicine is for infusion into a vein. It is given by a health care professional in a hospital or clinic setting. Talk to your pediatrician regarding the use of this medicine in children. Special care may be needed. Overdosage: If you think you have taken too much of this medicine contact a poison control center or emergency room at once. NOTE: This medicine is only for you. Do not share this medicine with others. What if I miss a dose? Keep appointments for follow-up doses as directed. It is important not to miss your dose. Call your doctor or health care professional if you are unable to keep an appointment. What may interact with this medicine? Interactions have not been studied. This list may not describe all possible interactions. Give your health care provider a list of all the medicines, herbs, non-prescription drugs, or dietary supplements you use. Also tell them if you smoke, drink alcohol, or use illegal drugs. Some items may interact with your medicine. What should I watch for while using this medicine? This drug may make you feel generally unwell. Report any side effects. Continue your course of treatment even though you feel ill unless your doctor tells you to stop. This  medicine can cause serious allergic reactions. To reduce your risk you may need to take medicine before treatment with this medicine. Take your medicine as directed. This medicine can affect the results of blood tests to match your blood type. These changes can last for up to 6 months after the final dose. Your healthcare provider will do blood tests to match your blood type before you start treatment. Tell all of your healthcare providers that you are being treated with this medicine before receiving a blood transfusion. This medicine can affect the results of some tests used to determine treatment response; extra tests may be needed to evaluate response. Do not become pregnant while taking this medicine or for 3 months after stopping it. Women should inform their doctor if they wish to become pregnant or think they might be pregnant. There is a potential for serious side effects to an unborn child. Talk to your health care professional or pharmacist for more information. What side effects may I notice from receiving this medicine? Side effects that you should report to your doctor or health care professional as soon as possible:  allergic reactions like skin rash, itching or hives, swelling of the face, lips, or tongue  breathing problems  chills  cough  dizziness  feeling faint or lightheaded  headache  low blood counts - this medicine may decrease the number of white blood cells, red blood cells and platelets. You may be at increased risk for infections and bleeding.  nausea, vomiting  shortness of breath  signs of decreased platelets or bleeding - bruising, pinpoint red spots on  the skin, black, tarry stools, blood in the urine  signs of decreased red blood cells - unusually weak or tired, feeling faint or lightheaded, falls  signs of infection - fever or chills, cough, sore throat, pain or difficulty passing urine  signs and symptoms of liver injury like dark yellow or brown  urine; general ill feeling or flu-like symptoms; light-colored stools; loss of appetite; right upper belly pain; unusually weak or tired; yellowing of the eyes or skin Side effects that usually do not require medical attention (report to your doctor or health care professional if they continue or are bothersome):  back pain  constipation  loss of appetite  diarrhea  joint pain  muscle cramps  pain, tingling, numbness in the hands or feet  swelling of the ankles, feet, hands  tiredness  trouble sleeping This list may not describe all possible side effects. Call your doctor for medical advice about side effects. You may report side effects to FDA at 1-800-FDA-1088. Where should I keep my medicine? Keep out of the reach of children. This drug is given in a hospital or clinic and will not be stored at home. NOTE: This sheet is a summary. It may not cover all possible information. If you have questions about this medicine, talk to your doctor, pharmacist, or health care provider.  2020 Elsevier/Gold Standard (2018-07-11 14:00:48)

## 2019-07-16 LAB — PROTEIN ELECTROPHORESIS, SERUM
A/G Ratio: 1.6 (ref 0.7–1.7)
Albumin ELP: 3.6 g/dL (ref 2.9–4.4)
Alpha-1-Globulin: 0.1 g/dL (ref 0.0–0.4)
Alpha-2-Globulin: 0.9 g/dL (ref 0.4–1.0)
Beta Globulin: 1 g/dL (ref 0.7–1.3)
Gamma Globulin: 0.2 g/dL — ABNORMAL LOW (ref 0.4–1.8)
Globulin, Total: 2.3 g/dL (ref 2.2–3.9)
M-Spike, %: 0.2 g/dL — ABNORMAL HIGH
Total Protein ELP: 5.9 g/dL — ABNORMAL LOW (ref 6.0–8.5)

## 2019-07-22 DIAGNOSIS — N183 Chronic kidney disease, stage 3 unspecified: Secondary | ICD-10-CM | POA: Insufficient documentation

## 2019-08-07 ENCOUNTER — Other Ambulatory Visit: Payer: Self-pay | Admitting: Hematology and Oncology

## 2019-08-07 DIAGNOSIS — C9001 Multiple myeloma in remission: Secondary | ICD-10-CM

## 2019-08-12 ENCOUNTER — Telehealth: Payer: Self-pay

## 2019-08-12 NOTE — Telephone Encounter (Signed)
spoke with the patient to see if he was taken 20 meq of potassium the states yes he is. I have sent Dr Mike Gip a message to inform her that the patient is currently taken 20 meq of potassium.

## 2019-08-12 NOTE — Telephone Encounter (Signed)
-----   Message from Lequita Asal, MD sent at 08/11/2019  5:05 PM EST ----- Regarding: Please call patient and find out if he is taking potassium 20 meq a day

## 2019-08-14 NOTE — Progress Notes (Signed)
[facility]  [address] [place], [place] [address] Phone: [phone]  Fax: [phone]   Clinic Day:  08/18/2019  Referring physician: Sofie Hartigan, MD  Chief Complaint: Logan Perez is a 73 y.o. 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   HPI: The patient was last seen in the medical oncology clinic on 07/15/2019. At that time, he was doing well.  He described pseudo-hallucinations at night 1-2 x/week. Exam was unremarkable. Hematocrit 35.3. Hemoglobin 12.1. MCV 102.0. M-spike was 0.2 gm/dL. I encouraged the patient to follow up with Dr. Lowella Dandy. Patient received cycle #33 daratumumab.The patient continued on Eliquis 5 mg BID.  During the interim, he has felt "great". He has had no changes in aches and pains. He notes diarrhea 2-3 times a day.  If he has diarrhea 3 times in 1 day, he will take Imodium.  Imodium manages diarrhea well.    Past Medical History:  Diagnosis Date   Anxiety    Atrial fibrillation (HCC)    BPH (benign prostatic hyperplasia)    Complication of anesthesia    BAD HEADACHE NIGHT OF FIRST CATARACT   Compression fracture of lumbar vertebra (HCC)    Difficulty voiding    Dysrhythmia    A FIB   Elevated PSA    GERD (gastroesophageal reflux disease)    Hearing aid worn    bilateral   History of kidney stones    HLD (hyperlipidemia)    HOH (hard of hearing)    Hypertension    Multiple myeloma (HCC)    Neuropathy    feet. 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   Pain    BACK   Palpitations    Pneumonia    Pulmonary embolism (HCC)    Sepsis (HCC)    Stroke (HCC)    TIA, detected on CT scan. pt was unaware    Past Surgical History:  Procedure Laterality Date   BACK SURGERY  1994   CATARACT EXTRACTION W/PHACO Right 05/02/2016   Procedure: CATARACT EXTRACTION PHACO AND INTRAOCULAR LENS PLACEMENT  (IOC);  Surgeon: Birder Robson, MD;  Location: [facility];  Service: Ophthalmology;  Laterality: Right;  [place] 1.06 AP% 20.6 CDE 13.70 FLUID PACK LOT # [id number] H   CATARACT EXTRACTION W/PHACO Left 05/16/2016   Procedure: CATARACT EXTRACTION PHACO AND INTRAOCULAR LENS PLACEMENT (IOC);  Surgeon: Birder Robson, MD;  Location: [facility];  Service: Ophthalmology;  Laterality: Left;  [place] 01:43 AP% 19.8 CDE 20.45 FLUID PACK LOT #[id number] H   COLONOSCOPY WITH PROPOFOL N/A 03/22/2017   Procedure: COLONOSCOPY WITH PROPOFOL;  Surgeon: Lucilla Lame, MD;  Location: [place];  Service: Endoscopy;  Laterality: N/A;  has port   ESOPHAGOGASTRODUODENOSCOPY (EGD) WITH PROPOFOL  03/22/2017   Procedure: ESOPHAGOGASTRODUODENOSCOPY (EGD) WITH PROPOFOL;  Surgeon: Lucilla Lame, MD;  Location: [place];  Service: Endoscopy;;   EYE SURGERY     KNEE ARTHROSCOPY Left 1992   LIMBAL STEM CELL TRANSPLANT     PAIN PUMP IMPLANTATION  2012   PAIN PUMP IMPLANTATION N/A 09/04/2017   Procedure: INTRATHECAL PUMP BATTERY CHANGE;  Surgeon: Milinda Pointer, MD;  Location: [facility];  Service: Neurosurgery;  Laterality: N/A;   PORTA CATH INSERTION N/A 12/04/2016   Procedure: Glori Luis Cath Insertion;  Surgeon: Algernon Huxley, MD;  Location: [facility];  Service: Cardiovascular;  Laterality: N/A;   stem cell implant  2008   [facility]  Family History  Problem Relation Age of Onset   Cancer Father        throat   Kidney disease Sister    Stroke Other    Stroke Mother    Bladder Cancer Neg Hx    Prostate cancer Neg Hx    Kidney cancer Neg Hx     Social History:  reports that he quit smoking about 43 years ago. His smoking use included cigarettes. He has a 30.00 pack-year smoking history. He has never used smokeless tobacco. He reports that he does not drink alcohol or use drugs. He has a wire haired dachshund. He lives in [place]. His wife's name is [name]. The patient is alone  today.  Allergies:  Allergies  Allergen Reactions   Azithromycin Diarrhea and Other (See Comments)    Possible cause of C. Diff Possible cause of C. Diff Possible cause of C. Diff Possible cause of C. Diff   Bee Pollen Other (See Comments)    Sneezing, watery eyes, runny nose   Pollen Extract Other (See Comments)    Sneezing, watery eyes, runny nose   Zoledronic Acid Other (See Comments)    ONG- Osteonecrosis of the jaw  Osteonecrosis of the jaw   Rivaroxaban Rash    Current Medications: Current Outpatient Medications  Medication Sig Dispense Refill   acetaminophen (TYLENOL) 500 MG tablet Take 500 mg by mouth 2 (two) times a day.     baclofen (LIORESAL) 10 MG tablet Take 1 tablet (10 mg total) by mouth 2 (two) times daily. 180 tablet 3   bismuth subsalicylate (PEPTO BISMOL) 262 MG/15ML suspension Take 30 mLs by mouth every 6 (six) hours as needed for diarrhea or loose stools.     Calcium Carb-Cholecalciferol (CALCIUM-VITAMIN D) 500-400 MG-UNIT TABS Take 1 tablet daily by mouth.     cetirizine (ZYRTEC) 10 MG tablet Take 10 mg by mouth daily as needed for allergies.      Cholecalciferol (VITAMIN D3) 2000 units capsule Take 2,000 Units daily by mouth.      Daratumumab (DARZALEX IV) Inject 1,200 mg every 30 (thirty) days into the vein.      dexamethasone (DECADRON) 4 MG tablet TAKE 4 TABLETS BY MOUTH 1 HOUR PRIOR TO INFUSION, THEN 1 TABLET ON DAYS 2 AND 3 AS DIRECTED 30 tablet 0   diltiazem (CARDIZEM CD) 120 MG 24 hr capsule Take 120 mg daily by mouth.      diltiazem (CARDIZEM) 60 MG tablet Take 60 mg daily as needed by mouth (for increased heart rate > 140).      diphenhydrAMINE (BENADRYL) 25 MG tablet Take 25 mg by mouth as directed. With chemo treatment     ELIQUIS 5 MG TABS tablet TAKE 1 TABLET BY MOUTH TWICE DAILY 60 tablet 0   fluticasone (FLONASE) 50 MCG/ACT nasal spray Place 1 spray into the nose daily as needed for allergies.      Hypromellose (ARTIFICIAL  TEARS OP) Place 1 drop as needed into both eyes (for dry eyes).     loperamide (IMODIUM) 2 MG capsule Take 4 mg as needed by mouth for diarrhea or loose stools.      loratadine (CLARITIN) 10 MG tablet Take 10 mg by mouth daily as needed.      lovastatin (MEVACOR) 20 MG tablet Take 20 mg by mouth every evening.      montelukast (SINGULAIR) 10 MG tablet TAKE 1 TABLET BY MOUTH DAILY THE DAY BEFORE THE INFUSION, THEN DAY OF AND THE 2 DAYS  AFTER INFUSION 30 tablet 0   Multiple Vitamins-Iron (MULTIVITAMIN/IRON PO) Take 1 tablet daily by mouth.     NON FORMULARY IT pump  Fentanyl 1,500.0 mcg/ml Bupivicaine 30.0 mg/ml Clonidine 300.0 mcg/ml Rate 502.2 mcg/day     omeprazole (PRILOSEC) 20 MG capsule Take 20 mg by mouth daily.      ondansetron (ZOFRAN) 8 MG tablet Take 1 tablet (8 mg total) by mouth as directed. Take with chemo and can take it as needed 20 tablet 1   PAIN MANAGEMENT IT PUMP REFILL 1 each by Intrathecal route once for 1 dose. Medication: PF Fentanyl 1,500.0 mcg/ml PF Bupivicaine 30.0 mg/ml PF Clonidine 300.0 mcg/ml Total Volume: 40 ml Needed by 12-10-2018 @ 1000 1 each 0   potassium chloride SA (K-DUR,KLOR-CON) 20 MEQ tablet TAKE 1 TABLET BY MOUTH DAILY 90 tablet 0   tamsulosin (FLOMAX) 0.4 MG CAPS capsule TAKE 1 CAPSULE(0.4 MG) BY MOUTH DAILY 90 capsule 4   valACYclovir (VALTREX) 500 MG tablet TAKE 1 TABLET BY MOUTH DAILY 30 tablet 3   vitamin B-12 (CYANOCOBALAMIN) 1000 MCG tablet Take 1,000 mcg daily by mouth.      PAIN MANAGEMENT IT PUMP REFILL 1 each by Intrathecal route once for 1 dose. Medication: PF Fentanyl 1,500.0 mcg/ml PF Bupivicaine 30.0 mg/ml PF Clonidine 300.mcg/ml Total Volume: 59m Needed by 07-02-19 @ 1000 1 each 0   No current facility-administered medications for this visit.     Review of Systems  Constitutional: Negative for chills, diaphoresis, fever, malaise/fatigue and weight loss (stable).       Feels "great".  HENT: Negative.  Negative for  congestion, ear discharge, ear pain, nosebleeds, sinus pain, sore throat and tinnitus.   Eyes: Negative.  Negative for blurred vision, double vision, photophobia and pain.  Respiratory: Negative.  Negative for cough, hemoptysis, sputum production, shortness of breath and wheezing.   Cardiovascular: Negative.  Negative for chest pain, palpitations, orthopnea, leg swelling and PND.       H/o atrial fibrillation.  Gastrointestinal: Positive for diarrhea (2-3 episodes a day- uses Imodium infrequently). Negative for abdominal pain, blood in stool, constipation, heartburn, melena, nausea and vomiting.  Genitourinary: Negative.  Negative for dysuria, frequency, hematuria and urgency.  Musculoskeletal: Negative for back pain (chronic; Fentanyl/bupivacaine/clonidine pump), falls, joint pain, myalgias and neck pain.       Pain pump filled every 14 weeks.  Skin: Negative.  Negative for itching and rash.  Neurological: Negative.  Negative for dizziness, tremors, sensory change (neuropathy in toes, stable), speech change, focal weakness, weakness and headaches.  Endo/Heme/Allergies: Negative.  Negative for environmental allergies. Does not bruise/bleed easily.  Psychiatric/Behavioral: Negative.  Negative for depression and memory loss. The patient is not nervous/anxious and does not have insomnia.   All other systems reviewed and are negative.  Performance status (ECOG):  1  Vitals Blood pressure 115/72, pulse 94, temperature 97.6 F (36.4 C), temperature source Tympanic, resp. rate 16, height '5\' 7"'  (1.702 m), weight 162 lb 7.7 oz (73.7 kg).  Physical Exam  Constitutional: He is oriented to person, place, and time. He appears well-developed and well-nourished. No distress.  HENT:  Head: Normocephalic and atraumatic.  Mouth/Throat: Oropharynx is clear and moist. No oropharyngeal exudate.  Short brown hair with graying. Goatee.  Mask.   Eyes: Pupils are equal, round, and reactive to light. Conjunctivae  and EOM are normal. No scleral icterus.  Glasses. Blue eyes.  Neck: Normal range of motion. Neck supple. No JVD present.  Cardiovascular: Normal rate, regular rhythm  and normal heart sounds. Exam reveals no gallop.  No murmur heard. Pulmonary/Chest: Effort normal and breath sounds normal. No respiratory distress. He has no wheezes. He has no rales.  Abdominal: Soft. Bowel sounds are normal. He exhibits no distension and no mass. There is no abdominal tenderness. There is no rebound and no guarding.  Right sided implanted Fentanyl/bupivacaine/clonidine pump.   Musculoskeletal: Normal range of motion.        General: No tenderness or edema.  Lymphadenopathy:       Head (right side): No preauricular, no posterior auricular and no occipital adenopathy present.       Head (left side): No preauricular, no posterior auricular and no occipital adenopathy present.    He has no cervical adenopathy.    He has no axillary adenopathy.       Right: No supraclavicular adenopathy present.       Left: No supraclavicular adenopathy present.  Neurological: He is alert and oriented to person, place, and time.  Skin: Skin is dry. No rash noted. He is not diaphoretic. No erythema. No pallor.  Psychiatric: He has a normal mood and affect. His behavior is normal. Judgment and thought content normal.  Nursing note and vitals reviewed.   Appointment on 08/18/2019  Component Date Value Ref Range Status   Magnesium 08/18/2019 1.9  1.7 - 2.4 mg/dL Final   Performed at [facility], [address], [place], [place] [address]   Sodium 08/18/2019 135  135 - 145 mmol/L Final   Potassium 08/18/2019 4.2  3.5 - 5.1 mmol/L Final   Chloride 08/18/2019 105  98 - 111 mmol/L Final   CO2 08/18/2019 23  22 - 32 mmol/L Final   Glucose, Bld 08/18/2019 162* 70 - 99 mg/dL Final   BUN 08/18/2019 20  8 - 23 mg/dL Final   Creatinine, Ser 08/18/2019 1.54* 0.61 - 1.24 mg/dL Final   Calcium 08/18/2019 9.1  8.9 -  10.3 mg/dL Final   Total Protein 08/18/2019 6.5  6.5 - 8.1 g/dL Final   Albumin 08/18/2019 4.2  3.5 - 5.0 g/dL Final   AST 08/18/2019 20  15 - 41 U/L Final   ALT 08/18/2019 14  0 - 44 U/L Final   Alkaline Phosphatase 08/18/2019 44  38 - 126 U/L Final   Total Bilirubin 08/18/2019 0.8  0.3 - 1.2 mg/dL Final   GFR calc non Af Amer 08/18/2019 44* >60 mL/min Final   GFR calc Af Amer 08/18/2019 51* >60 mL/min Final   Anion gap 08/18/2019 7  5 - 15 Final   Performed at [facility], [address] [place], [place] [address]   WBC 08/18/2019 9.2  4.0 - 10.5 K/uL Final   RBC 08/18/2019 3.58* 4.22 - 5.81 MIL/uL Final   Hemoglobin 08/18/2019 12.7* 13.0 - 17.0 g/dL Final   HCT 08/18/2019 36.0* 39.0 - 52.0 % Final   MCV 08/18/2019 100.6* 80.0 - 100.0 fL Final   MCH 08/18/2019 35.5* 26.0 - 34.0 pg Final   MCHC 08/18/2019 35.3  30.0 - 36.0 g/dL Final   RDW 08/18/2019 13.3  11.5 - 15.5 % Final   Platelets 08/18/2019 185  150 - 400 K/uL Final   nRBC 08/18/2019 0.0  0.0 - 0.2 % Final   Neutrophils Relative % 08/18/2019 82  % Final   Neutro Abs 08/18/2019 7.5  1.7 - 7.7 K/uL Final   Lymphocytes Relative 08/18/2019 13  % Final   Lymphs Abs 08/18/2019 1.2  0.7 -  4.0 K/uL Final   Monocytes Relative 08/18/2019 4  % Final   Monocytes Absolute 08/18/2019 0.4  0.1 - 1.0 K/uL Final   Eosinophils Relative 08/18/2019 1  % Final   Eosinophils Absolute 08/18/2019 0.1  0.0 - 0.5 K/uL Final   Basophils Relative 08/18/2019 0  % Final   Basophils Absolute 08/18/2019 0.0  0.0 - 0.1 K/uL Final   Immature Granulocytes 08/18/2019 0  % Final   Abs Immature Granulocytes 08/18/2019 0.03  0.00 - 0.07 K/uL Final   Performed at [facility], [address], [place], [place] [address]    Assessment:  Logan Perez is a 73 y.o. male with stage III mutiple myeloma. He initially presented with progressive back pain beginning in 12/2006. MRI revealed "spots and  compression fractures". He began Velcade, thalidomide, and Decadron. In 08/2007, he underwent high dose chemotherapy and autologous stem cell transplant. 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  He recurred with a rising M-spike (2.7) with repeat M spike (1.7 gm/dl) in 03/2010. He was initially treated with Velcade (02/08/2010 - 05/10/2010). He then began Revlimid (15 mg 3 weeks on/1 week off) and Decadron (40 mg on day 1, 8, 15, 22). Because of significant side effect with Decadron his dose was decreased to 10 mg once a week in 07/2010.   He was on maintenance Revlimid. Revlimid was initially10 mg 3 weeks on/1 week off. This was changed to 10 mg 2 weeks on/2 weeks off secondary to right nipple tenderness. His dose was increased to 10 mg 3 weeks on/1 week off with Decadron 10 mg a week (on Sundays) and then Revlamid 15 mg 3 weeks on and 1 week off with Decadron on Sundays. He began Pomalyst4 mg 3 weeks on/1 week off with Decadron on 08/27/2015.  Over the past year his SPEPhas revealed no monoclonal protein (04/21/2015) and 0.5 gm/dL on 09/22/2015 and 10/20/2015. M spikewas 0.1 on 02/02/2016, 03/01/2016, 03/29/2016, 04/26/2016, and 05/24/2016. M spikewas 0 on 10/11/2016, 11/28/2016, 02/05/2017, 03/21/2017, 08/02/2017, 10/04/2017, 12/27/2017, 01/24/2018, and 02/21/2018. M spikewas 0.1 on 11/01/2017, 0.1 on 11/29/2017, 0 on 12/27/2017, 0 on 01/24/2018, 0 on 02/21/2018, 0.1 on 03/21/2018, 0.3 on 04/18/2018, 0.1 on 05/16/2018, 0 on 06/13/2018, 0.2 on 08/08/2018, 0 on 09/09/2018, 0.1 on 11/04/2018, 0 on 12/02/2018, 0.1 on 12/30/2018, 0 on 01/27/2019, 0.1 on 02/24/2019, 0.1 on 03/24/2019, 0.1 on 04/23/2019, 0 on 05/19/2019, 0.1 on 06/17/2019, and 0.2 on 07/15/2019.  Free light chainshave been monitored. Kappa free light chainswere 18.54 on 11/21/2013, 18.37 on 02/20/2014, 18.93 on 05/22/2014, 32.58 (high; normal ratio 1.27) on 08/21/2014, 51.53 (high; elevated  ratio of 2.12) on 11/13/2014, 28.08 (ratio 1.73) on 12/09/2014, 23.71 (ratio 2.17) on 01/18/2015, 92.93 (ratio 9.49) on 04/21/2015, 93.44 (ratio 10.28) on 05/26/2015, 255.45 (ratio of 24.05) on 07/14/2015, 373.89 (ratio 48.31) on 08/04/2015, 474.33 (ratio 70.58) on 08/25/2015, 450.76 (ratio 55.44) on 09/22/2015, 453.4 (ratio 59.89) on 10/20/2015, 58.26 (ratio of >40.74) on 02/02/2016, 62.67 (ratio of 37.98) on 03/01/2016, 75.7 (ratio >50.47) on 03/29/2016, 79.7 (ratio 53.13) on 04/26/2016, 108.6 (ratio >72.4) on 05/24/2016, 8.3 (1.46 ratio) on 10/11/2016, 6.7 (0.81 ratio) on 02/05/2017, 7.8 (1.01 ratio) on 03/21/2017,7.6 (ratio 0.63) on 06/01/2017, 8.1 (ratio 1.13) on 11/29/2017, 9.6 (ratio 1.55) on 06/13/2018, 6.6 (ratio 2.06) on 07/11/2018, 6.9 (ratio 0.82) on 08/08/2018, 5.8 (ratio 1.66) on 09/09/2018, 5.8 (ratio 1.05) on 11/04/2018, 5.6(ratio 2.55)on 12/02/2018, 5.5 (ratio 1.96) on 01/27/2019, and 7.6 (ratio 2.38) on 06/17/2019.  Bone surveyon 12/08/2014 was stable. Bone survey on  10/21/2015 revealed increase conspicuity of subcentimeter lytic lesions in the calvarium.  Bone marrow aspirate and biopsyon 11/04/2015 revealed an atypical monoclonal plasma cells estimated at 30-40% of marrow cells. Marrow was variably cellular (approximately 45%) with background trilineage hematopoiesis. There was no significant increase in marrow reticulin fibers. Storage iron was present.   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 He develoed herpes zoster in 04/2008. He developed a pulmonary embolismin 05/2013. He was initially on Xarelto, but is now on Eliquis. He had an episode of pneumonia around this time requiring a brief admission. He developed severe lower leg cramps on 08/07/2014 secondary to hypokalemia. Duplex was negative.   He was treated forC difficile colitis(Flagyl completed 07/30/2015). He has a chronic indwelling pain  pump.  He received 4 cycles of Pomalyst and Decadron(08/27/2015 - 11/19/2015). Restaging studies document progressive disease. Kappa free light chains are increasing. SPEP revealed 0.5 gm/dL monoclonal protein then 1.3 gm/dL. Bone survey reveals increase conspicuity of subcentimeter lytic lesions in the calvarium. Bone marrow reveals 30-40% plasma cells.   MUGAon 11/30/2015 revealed an ejection fraction of 46%. He is felt not to be a good candidate for Kyprolis. There were no focal wall motion abnormalities. He had a stress echo less than 1 year ago. He has a history of PVCs and atrial fibrillation. He takes Cardizem prn.  He received 17 weeks of daratumumab(Darzalex) (12/09/2015 - 05/25/2016). He tolerated treatment well without side effect.  Bone marrow on 02/09/2016 revealed no diagnostic morphologic evidence of plasma cell myeloma. Marrow was normocellular to hypocellular marrow for age (ranging from 10-40%) with maturing trilineage hematopoiesis and mild multilineage dyspoiesis. There was patchy mild increase in reticulin. Storage iron was present. Flow cytometry revealed no definitive evidence of monoclonality. There was a non-specific atypical myeloid and monocytic findings with no increase in blasts. Cytogenetics were normal (46, XY).  He is currently71month s/p 2nd autologous stem cell transplanton 06/16/2016. 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 He also experienced atrial fibrillation with intermittent episodes of RVR requiring IV beta blockers. He is on prophylactic valacyclovir for 1 year post transplant.  He started vaccinations(DTaP-Pediatric triple vaccine, Hep B- Pediatrix triple vaccine, Haemophilus influenza B (Hib), inactivated polio virus (IPV), pneumococcal conjugate vaccine 13-valent (PCV 13)) on 04/17/2017. Recommendation was for [place]rix vaccine [place] dose of  Shingrix in 2 months. He had follow-up vaccinations on 08/28/2017. He had his second shingles vaccine. He received 18 month vaccinations- DTaP-Pediatric triple vaccine, Hep B- Pediatrix triple vaccine, Haemophilus influenza B (Hib), inactivated polio virus (IPV), pneumococcal conjugate vaccine 13-valent (PCV 13) on 02/05/2018.  He is s/p33cycles ofdaratumumab(Darzalex) post transplant(12/05/2016 - 07/15/2019).  PET scanon 08/14/2018 revealed no evidence of active myeloma on whole-body FDG PET scan. There was no evidence soft tissue plasmacytoma. There was no clear evidence of lytic lesions on the CT portion exam. Some lucencies in the pelvis were stable. There were chronic compression fractures in the lower thoracic spine.  He has a history of hypomagnesemiathat required IV magnesium (2-4 gm) weekly. He has not required magnesium since 10/24/2016. Patient has atrial fibrillationand is on Eliquis.  Symptomatically, he is doing well.  He denies any bone pain or interval infections.  Exam is stable.  Plan: 1.   Labs today: CBC with diff, CMP, Mg, SPEP, FLCA. 2.Multiple myeloma Clinically, he continues to do well. M spike continues to fluctuate between 0 - 0.2 gm/dL over the  past several months. M-spike was 0.2 on 07/15/2019. M-spike is pending today.  Labs reviewed.  Blood counts are electrolytes are stable and adequate for treatment.  Patient confirmed he took premedications prior to treatment today. Cycle #34 daratumumab today. Review plan to continue Valacyclovir prophylaxis until 3 months s/p daratumumab. Refill: ondansetron and montelukast. Patient is no longer followed in the transplant center. Continue periodic phone follow-up with Dr. Adriana Simas at [facility]. Discuss symptom management.  He has antiemetics and pain medications at home to use on a prn bases.  Interventions are adequate.       3. Macrocytic anemia Hematocrit36.0.  Hemoglobin12.7. MCV100.6. B12, folate and TSH were normal in04/20/2020. Continue to monitor. 4.Neuropathy Patient has a stable grade I neuropathy. Continue to monitor. 5.Chronic diarrhea Patient's bowels continue to fluctuate. He uses Imodium if he has 3 stools/day. Continue to monitor. 6.History of pulmonary embolism(2014) Continue Eliquis. 7.   Hypokalemia             Potassium 4.2.  Patient has been on chronic supplementation oral potassium.             Refill: potassium chloride 20 meq po q day. 8.   RTC in 1 month for MD assessment, labs (CBC with diff, CMP, Mg, SPEP), and cycle #35 durvalumab.  I discussed the assessment and treatment plan with the patient.  The patient was provided an opportunity to ask questions and all were answered.  The patient agreed with the plan and demonstrated an understanding of the instructions.  The patient was advised to call back if the symptoms worsen or if the condition fails to improve as anticipated.   Lequita Asal, MD, PhD    08/18/2019, 9:55 AM  I, Selena Batten, am acting as scribe for [employer]. Mike Gip, MD, PhD.  I, Macee Venables C. Mike Gip, MD, have reviewed the above documentation for accuracy and completeness, and I agree with the above.

## 2019-08-18 ENCOUNTER — Other Ambulatory Visit: Payer: Self-pay | Admitting: Hematology and Oncology

## 2019-08-18 ENCOUNTER — Inpatient Hospital Stay: Payer: Medicare Other | Attending: Hematology and Oncology | Admitting: Hematology and Oncology

## 2019-08-18 ENCOUNTER — Inpatient Hospital Stay: Payer: Medicare Other

## 2019-08-18 ENCOUNTER — Other Ambulatory Visit: Payer: Self-pay

## 2019-08-18 ENCOUNTER — Encounter: Payer: Self-pay | Admitting: Hematology and Oncology

## 2019-08-18 VITALS — BP 131/77 | HR 75 | Temp 97.9°F | Resp 18

## 2019-08-18 VITALS — BP 115/72 | HR 94 | Temp 97.6°F | Resp 16 | Ht 67.0 in | Wt 162.5 lb

## 2019-08-18 DIAGNOSIS — D539 Nutritional anemia, unspecified: Secondary | ICD-10-CM | POA: Diagnosis not present

## 2019-08-18 DIAGNOSIS — C9002 Multiple myeloma in relapse: Secondary | ICD-10-CM | POA: Insufficient documentation

## 2019-08-18 DIAGNOSIS — Z9484 Stem cells transplant status: Secondary | ICD-10-CM

## 2019-08-18 DIAGNOSIS — Z79899 Other long term (current) drug therapy: Secondary | ICD-10-CM | POA: Insufficient documentation

## 2019-08-18 DIAGNOSIS — I2782 Chronic pulmonary embolism: Secondary | ICD-10-CM | POA: Diagnosis not present

## 2019-08-18 DIAGNOSIS — Z5112 Encounter for antineoplastic immunotherapy: Secondary | ICD-10-CM | POA: Diagnosis present

## 2019-08-18 DIAGNOSIS — C9000 Multiple myeloma not having achieved remission: Secondary | ICD-10-CM

## 2019-08-18 DIAGNOSIS — C9001 Multiple myeloma in remission: Secondary | ICD-10-CM

## 2019-08-18 LAB — COMPREHENSIVE METABOLIC PANEL
ALT: 14 U/L (ref 0–44)
AST: 20 U/L (ref 15–41)
Albumin: 4.2 g/dL (ref 3.5–5.0)
Alkaline Phosphatase: 44 U/L (ref 38–126)
Anion gap: 7 (ref 5–15)
BUN: 20 mg/dL (ref 8–23)
CO2: 23 mmol/L (ref 22–32)
Calcium: 9.1 mg/dL (ref 8.9–10.3)
Chloride: 105 mmol/L (ref 98–111)
Creatinine, Ser: 1.54 mg/dL — ABNORMAL HIGH (ref 0.61–1.24)
GFR calc Af Amer: 51 mL/min — ABNORMAL LOW (ref >60)
GFR calc non Af Amer: 44 mL/min — ABNORMAL LOW (ref >60)
Glucose, Bld: 162 mg/dL — ABNORMAL HIGH (ref 70–99)
Potassium: 4.2 mmol/L (ref 3.5–5.1)
Sodium: 135 mmol/L (ref 135–145)
Total Bilirubin: 0.8 mg/dL (ref 0.3–1.2)
Total Protein: 6.5 g/dL (ref 6.5–8.1)

## 2019-08-18 LAB — CBC WITH DIFFERENTIAL/PLATELET
Abs Immature Granulocytes: 0.03 10*3/uL (ref 0.00–0.07)
Basophils Absolute: 0 10*3/uL (ref 0.0–0.1)
Basophils Relative: 0 %
Eosinophils Absolute: 0.1 10*3/uL (ref 0.0–0.5)
Eosinophils Relative: 1 %
HCT: 36 % — ABNORMAL LOW (ref 39.0–52.0)
Hemoglobin: 12.7 g/dL — ABNORMAL LOW (ref 13.0–17.0)
Immature Granulocytes: 0 %
Lymphocytes Relative: 13 %
Lymphs Abs: 1.2 10*3/uL (ref 0.7–4.0)
MCH: 35.5 pg — ABNORMAL HIGH (ref 26.0–34.0)
MCHC: 35.3 g/dL (ref 30.0–36.0)
MCV: 100.6 fL — ABNORMAL HIGH (ref 80.0–100.0)
Monocytes Absolute: 0.4 10*3/uL (ref 0.1–1.0)
Monocytes Relative: 4 %
Neutro Abs: 7.5 10*3/uL (ref 1.7–7.7)
Neutrophils Relative %: 82 %
Platelets: 185 10*3/uL (ref 150–400)
RBC: 3.58 MIL/uL — ABNORMAL LOW (ref 4.22–5.81)
RDW: 13.3 % (ref 11.5–15.5)
WBC: 9.2 10*3/uL (ref 4.0–10.5)
nRBC: 0 % (ref 0.0–0.2)

## 2019-08-18 LAB — MAGNESIUM: Magnesium: 1.9 mg/dL (ref 1.7–2.4)

## 2019-08-18 MED ORDER — SODIUM CHLORIDE 0.9% FLUSH
10.0000 mL | INTRAVENOUS | Status: DC | PRN
  Administered 2019-08-18: 10 mL
  Filled 2019-08-18: qty 10

## 2019-08-18 MED ORDER — MONTELUKAST SODIUM 10 MG PO TABS: ORAL_TABLET | ORAL | 0 refills | Status: DC

## 2019-08-18 MED ORDER — SODIUM CHLORIDE 0.9 % IV SOLN
16.2000 mg/kg | Freq: Once | INTRAVENOUS | Status: DC
  Filled 2019-08-18: qty 60

## 2019-08-18 MED ORDER — HEPARIN SOD (PORK) LOCK FLUSH 100 UNIT/ML IV SOLN
500.0000 [IU] | Freq: Once | INTRAVENOUS | Status: AC | PRN
  Administered 2019-08-18: 500 [IU]
  Filled 2019-08-18: qty 5

## 2019-08-18 MED ORDER — SODIUM CHLORIDE 0.9 % IV SOLN
16.2000 mg/kg | Freq: Once | INTRAVENOUS | Status: AC
  Administered 2019-08-18: 1200 mg via INTRAVENOUS
  Filled 2019-08-18: qty 60

## 2019-08-18 MED ORDER — ONDANSETRON HCL 8 MG PO TABS: 8.0000 mg | ORAL_TABLET | ORAL | 1 refills | Status: DC

## 2019-08-18 MED ORDER — SODIUM CHLORIDE 0.9 % IV SOLN
Freq: Once | INTRAVENOUS | Status: AC
  Administered 2019-08-18: 10:00:00 via INTRAVENOUS
  Filled 2019-08-18: qty 250

## 2019-08-18 MED ORDER — POTASSIUM CHLORIDE CRYS ER 20 MEQ PO TBCR: 20.0000 meq | EXTENDED_RELEASE_TABLET | Freq: Every day | ORAL | 0 refills | Status: DC

## 2019-08-18 NOTE — Patient Instructions (Signed)
Daratumumab injection What is this medicine? DARATUMUMAB (dar a toom ue mab) is a monoclonal antibody. It is used to treat multiple myeloma. This medicine may be used for other purposes; ask your health care provider or pharmacist if you have questions. COMMON BRAND NAME(S): DARZALEX What should I tell my health care provider before I take this medicine? They need to know if you have any of these conditions:  infection (especially a virus infection such as chickenpox, herpes, or hepatitis B virus)  lung or breathing disease  an unusual or allergic reaction to daratumumab, other medicines, foods, dyes, or preservatives  pregnant or trying to get pregnant  breast-feeding How should I use this medicine? This medicine is for infusion into a vein. It is given by a health care professional in a hospital or clinic setting. Talk to your pediatrician regarding the use of this medicine in children. Special care may be needed. Overdosage: If you think you have taken too much of this medicine contact a poison control center or emergency room at once. NOTE: This medicine is only for you. Do not share this medicine with others. What if I miss a dose? Keep appointments for follow-up doses as directed. It is important not to miss your dose. Call your doctor or health care professional if you are unable to keep an appointment. What may interact with this medicine? Interactions have not been studied. This list may not describe all possible interactions. Give your health care provider a list of all the medicines, herbs, non-prescription drugs, or dietary supplements you use. Also tell them if you smoke, drink alcohol, or use illegal drugs. Some items may interact with your medicine. What should I watch for while using this medicine? This drug may make you feel generally unwell. Report any side effects. Continue your course of treatment even though you feel ill unless your doctor tells you to stop. This  medicine can cause serious allergic reactions. To reduce your risk you may need to take medicine before treatment with this medicine. Take your medicine as directed. This medicine can affect the results of blood tests to match your blood type. These changes can last for up to 6 months after the final dose. Your healthcare provider will do blood tests to match your blood type before you start treatment. Tell all of your healthcare providers that you are being treated with this medicine before receiving a blood transfusion. This medicine can affect the results of some tests used to determine treatment response; extra tests may be needed to evaluate response. Do not become pregnant while taking this medicine or for 3 months after stopping it. Women should inform their doctor if they wish to become pregnant or think they might be pregnant. There is a potential for serious side effects to an unborn child. Talk to your health care professional or pharmacist for more information. What side effects may I notice from receiving this medicine? Side effects that you should report to your doctor or health care professional as soon as possible:  allergic reactions like skin rash, itching or hives, swelling of the face, lips, or tongue  breathing problems  chills  cough  dizziness  feeling faint or lightheaded  headache  low blood counts - this medicine may decrease the number of white blood cells, red blood cells and platelets. You may be at increased risk for infections and bleeding.  nausea, vomiting  shortness of breath  signs of decreased platelets or bleeding - bruising, pinpoint red spots on  the skin, black, tarry stools, blood in the urine  signs of decreased red blood cells - unusually weak or tired, feeling faint or lightheaded, falls  signs of infection - fever or chills, cough, sore throat, pain or difficulty passing urine  signs and symptoms of liver injury like dark yellow or brown  urine; general ill feeling or flu-like symptoms; light-colored stools; loss of appetite; right upper belly pain; unusually weak or tired; yellowing of the eyes or skin Side effects that usually do not require medical attention (report to your doctor or health care professional if they continue or are bothersome):  back pain  constipation  loss of appetite  diarrhea  joint pain  muscle cramps  pain, tingling, numbness in the hands or feet  swelling of the ankles, feet, hands  tiredness  trouble sleeping This list may not describe all possible side effects. Call your doctor for medical advice about side effects. You may report side effects to FDA at 1-800-FDA-1088. Where should I keep my medicine? Keep out of the reach of children. This drug is given in a hospital or clinic and will not be stored at home. NOTE: This sheet is a summary. It may not cover all possible information. If you have questions about this medicine, talk to your doctor, pharmacist, or health care provider.  2020 Elsevier/Gold Standard (2018-07-11 14:00:48)

## 2019-08-18 NOTE — Progress Notes (Signed)
No new changes noted today 

## 2019-08-19 LAB — PROTEIN ELECTROPHORESIS, SERUM
A/G Ratio: 1.4 (ref 0.7–1.7)
Albumin ELP: 3.6 g/dL (ref 2.9–4.4)
Alpha-1-Globulin: 0.1 g/dL (ref 0.0–0.4)
Alpha-2-Globulin: 0.8 g/dL (ref 0.4–1.0)
Beta Globulin: 0.9 g/dL (ref 0.7–1.3)
Gamma Globulin: 0.5 g/dL (ref 0.4–1.8)
Globulin, Total: 2.5 g/dL (ref 2.2–3.9)
M-Spike, %: 0.2 g/dL — ABNORMAL HIGH
Total Protein ELP: 6.1 g/dL (ref 6.0–8.5)

## 2019-08-22 ENCOUNTER — Other Ambulatory Visit: Payer: Self-pay | Admitting: Hematology and Oncology

## 2019-08-22 ENCOUNTER — Telehealth: Payer: Self-pay

## 2019-08-22 NOTE — Telephone Encounter (Signed)
refilled Valacyclovir 500 mg 1 tab po daily # 30 wtih 3 refills. The patient has been informed.

## 2019-09-08 ENCOUNTER — Other Ambulatory Visit: Payer: Self-pay | Admitting: Hematology and Oncology

## 2019-09-08 DIAGNOSIS — C9001 Multiple myeloma in remission: Secondary | ICD-10-CM

## 2019-09-13 NOTE — Progress Notes (Signed)
[facility]  [address] [place], [place] [address] Phone: [phone]  Fax: [phone]   Clinic Day:  09/15/2019  Referring physician: Sofie Hartigan, MD  Chief Complaint: Logan Perez is a 73 y.o. 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  HPI: The patient was last seen in the medical oncology clinic on 08/18/2019. At that time, he was doing well.  He denied any bone pain or interval infections.  Exam was stable. Hematocrit 36.0, hemoglobin 12.7, MCV 100.9.  M-spike was 0.2 gm/dL. Magnesium was 1.9.  He received cycle #34 daratumumab.  During the interim, he has been " pretty good, and content where I am at".  Symptomatically, he notes no new changes. He hasn't exercised as much but is doing "alright". Bowel movement are still the same (no more than 2/day). He still takes 1 Imodium after each bowel movement. His pattern has become more sporadic.   He notes that his feet still bother him and are very cold. He was going to try Lyrica and decided not pursue after reviewing warnings and side effects.   He starts his day off with 2 500 mg Tylenol along side his pain pump which takes care of most of his back pain   Past Medical History:  Diagnosis Date   Anxiety    Atrial fibrillation (HCC)    BPH (benign prostatic hyperplasia)    Complication of anesthesia    BAD HEADACHE NIGHT OF FIRST CATARACT   Compression fracture of lumbar vertebra (HCC)    Difficulty voiding    Dysrhythmia    A FIB   Elevated PSA    GERD (gastroesophageal reflux disease)    Hearing aid worn    bilateral   History of kidney stones    HLD (hyperlipidemia)    HOH (hard of hearing)    Hypertension    Multiple myeloma (HCC)    Neuropathy    feet. 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   Pain    BACK   Palpitations    Pneumonia    Pulmonary embolism  (HCC)    Sepsis (HCC)    Stroke (HCC)    TIA, detected on CT scan. pt was unaware    Past Surgical History:  Procedure Laterality Date   BACK SURGERY  1994   CATARACT EXTRACTION W/PHACO Right 05/02/2016   Procedure: CATARACT EXTRACTION PHACO AND INTRAOCULAR LENS PLACEMENT (IOC);  Surgeon: Birder Robson, MD;  Location: [facility];  Service: Ophthalmology;  Laterality: Right;  [place] 1.06 AP% 20.6 CDE 13.70 FLUID PACK LOT # [id number]   CATARACT EXTRACTION W/PHACO Left 05/16/2016   Procedure: CATARACT EXTRACTION PHACO AND INTRAOCULAR LENS PLACEMENT (IOC);  Surgeon: Birder Robson, MD;  Location: [facility];  Service: Ophthalmology;  Laterality: Left;  [place] 01:43 AP% 19.8 CDE 20.45 FLUID PACK LOT #[id number] H   COLONOSCOPY WITH PROPOFOL N/A 03/22/2017   Procedure: COLONOSCOPY WITH PROPOFOL;  Surgeon: Lucilla Lame, MD;  Location: [place];  Service: Endoscopy;  Laterality: N/A;  has port   ESOPHAGOGASTRODUODENOSCOPY (EGD) WITH PROPOFOL  03/22/2017   Procedure: ESOPHAGOGASTRODUODENOSCOPY (EGD) WITH PROPOFOL;  Surgeon: Lucilla Lame, MD;  Location: [place];  Service: Endoscopy;;   EYE SURGERY     KNEE ARTHROSCOPY Left 1992   LIMBAL STEM CELL TRANSPLANT     PAIN PUMP IMPLANTATION  2012   PAIN PUMP IMPLANTATION N/A 09/04/2017   Procedure: INTRATHECAL PUMP  BATTERY CHANGE;  Surgeon: Milinda Pointer, MD;  Location: [facility];  Service: Neurosurgery;  Laterality: N/A;   PORTA CATH INSERTION N/A 12/04/2016   Procedure: Glori Luis Cath Insertion;  Surgeon: Algernon Huxley, MD;  Location: [facility];  Service: Cardiovascular;  Laterality: N/A;   stem cell implant  2008   [facility]    Family History  Problem Relation Age of Onset   Cancer Father        throat   Kidney disease Sister    Stroke Other    Stroke Mother    Bladder Cancer Neg Hx    Prostate cancer Neg Hx    Kidney cancer Neg Hx     Social History:  reports that he quit smoking about 43 years ago. His  smoking use included cigarettes. He has a 30.00 pack-year smoking history. He has never used smokeless tobacco. He reports that he does not drink alcohol or use drugs. He has a wire haired dachshund. He lives in [place]. His wife's name is [name] The patient is alone today.  Allergies:  Allergies  Allergen Reactions   Azithromycin Diarrhea and Other (See Comments)    Possible cause of C. Diff Possible cause of C. Diff Possible cause of C. Diff Possible cause of C. Diff   Bee Pollen Other (See Comments)    Sneezing, watery eyes, runny nose   Pollen Extract Other (See Comments)    Sneezing, watery eyes, runny nose   Zoledronic Acid Other (See Comments)    ONG- Osteonecrosis of the jaw  Osteonecrosis of the jaw   Rivaroxaban Rash    Current Medications: Current Outpatient Medications  Medication Sig Dispense Refill   acetaminophen (TYLENOL) 500 MG tablet Take 500 mg by mouth 2 (two) times a day.     baclofen (LIORESAL) 10 MG tablet Take 1 tablet (10 mg total) by mouth 2 (two) times daily. 180 tablet 3   Calcium Carb-Cholecalciferol (CALCIUM-VITAMIN D) 500-400 MG-UNIT TABS Take 1 tablet daily by mouth.     cetirizine (ZYRTEC) 10 MG tablet Take 10 mg by mouth daily as needed for allergies.      Cholecalciferol (VITAMIN D3) 2000 units capsule Take 2,000 Units daily by mouth.      Daratumumab (DARZALEX IV) Inject 1,200 mg every 30 (thirty) days into the vein.      dexamethasone (DECADRON) 4 MG tablet TAKE 4 TABLETS BY MOUTH 1 HOUR PRIOR TO INFUSION, THEN 1 TABLET ON DAYS 2 AND 3 AS DIRECTED 30 tablet 0   diltiazem (CARDIZEM CD) 120 MG 24 hr capsule Take 120 mg daily by mouth.      diltiazem (CARDIZEM) 60 MG tablet Take 60 mg daily as needed by mouth (for increased heart rate > 140).      ELIQUIS 5 MG TABS tablet TAKE 1 TABLET BY MOUTH TWICE DAILY 60 tablet 0   fluticasone (FLONASE) 50 MCG/ACT nasal spray Place 1 spray into the nose daily as needed for allergies.       Hypromellose (ARTIFICIAL TEARS OP) Place 1 drop as needed into both eyes (for dry eyes).     loperamide (IMODIUM) 2 MG capsule Take 4 mg as needed by mouth for diarrhea or loose stools.      loratadine (CLARITIN) 10 MG tablet Take 10 mg by mouth daily as needed.      lovastatin (MEVACOR) 20 MG tablet Take 20 mg by mouth every evening.      montelukast (SINGULAIR) 10 MG tablet TAKE 1 TABLET  BY MOUTH DAILY THE DAY BEFORE THE INFUSION, THEN DAY OF, AND THE 2 DAYS AFTER INFUSION 30 tablet 0   Multiple Vitamins-Iron (MULTIVITAMIN/IRON PO) Take 1 tablet daily by mouth.     NON FORMULARY IT pump  Fentanyl 1,500.0 mcg/ml Bupivicaine 30.0 mg/ml Clonidine 300.0 mcg/ml Rate 502.2 mcg/day     omeprazole (PRILOSEC) 20 MG capsule Take 20 mg by mouth daily.      PAIN MANAGEMENT IT PUMP REFILL 1 each by Intrathecal route once for 1 dose. Medication: PF Fentanyl 1,500.0 mcg/ml PF Bupivicaine 30.0 mg/ml PF Clonidine 300.0 mcg/ml Total Volume: 40 ml Needed by 12-10-2018 @ 1000 1 each 0   potassium chloride SA (KLOR-CON) 20 MEQ tablet Take 1 tablet (20 mEq total) by mouth daily. 90 tablet 0   tamsulosin (FLOMAX) 0.4 MG CAPS capsule TAKE 1 CAPSULE(0.4 MG) BY MOUTH DAILY 90 capsule 4   valACYclovir (VALTREX) 500 MG tablet TAKE 1 TABLET BY MOUTH EVERY DAY 30 tablet 3   vitamin B-12 (CYANOCOBALAMIN) 1000 MCG tablet Take 1,000 mcg daily by mouth.      bismuth subsalicylate (PEPTO BISMOL) 262 MG/15ML suspension Take 30 mLs by mouth every 6 (six) hours as needed for diarrhea or loose stools.     diphenhydrAMINE (BENADRYL) 25 MG tablet Take 25 mg by mouth as directed. With chemo treatment     ondansetron (ZOFRAN) 8 MG tablet Take 1 tablet (8 mg total) by mouth as directed. Take with chemo and can take it as needed (Patient not taking: Reported on 09/15/2019) 20 tablet 1   PAIN MANAGEMENT IT PUMP REFILL 1 each by Intrathecal route once for 1 dose. Medication: PF Fentanyl 1,500.0 mcg/ml PF Bupivicaine 30.0  mg/ml PF Clonidine 300.mcg/ml Total Volume: 29m Needed by 07-02-19 @ 1000 1 each 0   No current facility-administered medications for this visit.    Facility-Administered Medications Ordered in Other Visits  Medication Dose Route Frequency Provider Last Rate Last Dose   heparin lock flush 100 unit/mL  500 Units Intravenous Once Culley Hedeen C, MD       sodium chloride flush (NS) 0.9 % injection 10 mL  10 mL Intravenous PRN CLequita Asal MD   10 mL at 09/15/19 00174   Review of Systems  Constitutional: Negative.  Negative for chills, diaphoresis, fever, malaise/fatigue and weight loss (up 1 pound).       Feels "alright".  HENT: Positive for hearing loss. Negative for congestion, ear discharge, ear pain, nosebleeds, sinus pain, sore throat and tinnitus.   Eyes: Negative.  Negative for blurred vision, double vision, photophobia and pain.  Respiratory: Negative.  Negative for cough, hemoptysis, sputum production, shortness of breath and wheezing.   Cardiovascular: Negative.  Negative for chest pain, palpitations, orthopnea, leg swelling and PND.       H/o atrial fibrillation.  Gastrointestinal: Positive for diarrhea (2 loose stools/day- uses Imodium infrequently). Negative for abdominal pain, blood in stool, constipation, heartburn, melena, nausea and vomiting.  Genitourinary: Negative.  Negative for dysuria, frequency, hematuria and urgency.  Musculoskeletal: Positive for back pain (chronic; Fentanyl/bupivacaine/clonidine pump). Negative for falls, joint pain, myalgias and neck pain.       Pain pump filled every 14 weeks.  Skin: Negative.  Negative for itching and rash.  Neurological: Negative.  Negative for dizziness, tremors, sensory change (neuropathy in toes, stable), speech change, focal weakness, weakness and headaches.  Endo/Heme/Allergies: Negative.  Negative for environmental allergies. Does not bruise/bleed easily.  Psychiatric/Behavioral: Negative.  Negative for  depression and  memory loss. The patient is not nervous/anxious and does not have insomnia.   All other systems reviewed and are negative.  Performance status (ECOG): 1 - Symptomatic but completely ambulatory  Vitals Blood pressure 113/72, pulse 82, temperature 97.7 F (36.5 C), temperature source Tympanic, resp. rate 18, height '5\' 7"'  (1.702 m), weight 163 lb 12.8 oz (74.3 kg), SpO2 98 %.   Physical Exam  Constitutional: He is oriented to person, place, and time. He appears well-developed and well-nourished. No distress.  HENT:  Head: Normocephalic and atraumatic.  Mouth/Throat: Oropharynx is clear and moist. No oropharyngeal exudate.  Short brown hair with graying. Goatee.  Hearing aid.  Mask.   Eyes: Pupils are equal, round, and reactive to light. Conjunctivae and EOM are normal. No scleral icterus.  Glasses. Blue eyes.  Neck: No JVD present.  Cardiovascular: Normal rate, regular rhythm, normal heart sounds and intact distal pulses. Exam reveals no gallop.  No murmur heard. Pulmonary/Chest: Effort normal and breath sounds normal. No respiratory distress. He has no wheezes. He has no rales.  Abdominal: Soft. Bowel sounds are normal. He exhibits no distension and no mass. There is no abdominal tenderness. There is no rebound and no guarding.  Right sided implanted Fentanyl/bupivacaine/clonidine pump.   Musculoskeletal:        General: No tenderness or edema.     Cervical back: Normal range of motion and neck supple.  Lymphadenopathy:       Head (right side): No preauricular, no posterior auricular and no occipital adenopathy present.       Head (left side): No preauricular, no posterior auricular and no occipital adenopathy present.    He has no cervical adenopathy.    He has no axillary adenopathy.       Right: No inguinal and no supraclavicular adenopathy present.       Left: No inguinal and no supraclavicular adenopathy present.  Neurological: He is alert and oriented to person,  place, and time.  Skin: Skin is warm and dry. No rash noted. He is not diaphoretic. No erythema. No pallor.  Psychiatric: He has a normal mood and affect. His behavior is normal. Judgment and thought content normal.  Nursing note and vitals reviewed.   Infusion on 09/15/2019  Component Date Value Ref Range Status   Magnesium 09/15/2019 1.9  1.7 - 2.4 mg/dL Final   Performed at [facility], [address] [place], [place] [address]   Sodium 09/15/2019 134* 135 - 145 mmol/L Final   Potassium 09/15/2019 3.8  3.5 - 5.1 mmol/L Final   Chloride 09/15/2019 102  98 - 111 mmol/L Final   CO2 09/15/2019 21* 22 - 32 mmol/L Final   Glucose, Bld 09/15/2019 139* 70 - 99 mg/dL Final   BUN 09/15/2019 19  8 - 23 mg/dL Final   Creatinine, Ser 09/15/2019 1.58* 0.61 - 1.24 mg/dL Final   Calcium 09/15/2019 8.9  8.9 - 10.3 mg/dL Final   Total Protein 09/15/2019 5.9* 6.5 - 8.1 g/dL Final   Albumin 09/15/2019 4.1  3.5 - 5.0 g/dL Final   AST 09/15/2019 18  15 - 41 U/L Final   ALT 09/15/2019 13  0 - 44 U/L Final   Alkaline Phosphatase 09/15/2019 39  38 - 126 U/L Final   Total Bilirubin 09/15/2019 0.8  0.3 - 1.2 mg/dL Final   GFR calc non Af Amer 09/15/2019 43* >60 mL/min Final   GFR calc Af Amer 09/15/2019 50* >60 mL/min Final   Anion gap 09/15/2019  11  5 - 15 Final   Performed at [facility], [address], [place], [place] [address]   WBC 09/15/2019 9.7  4.0 - 10.5 K/uL Final   RBC 09/15/2019 3.38* 4.22 - 5.81 MIL/uL Final   Hemoglobin 09/15/2019 11.9* 13.0 - 17.0 g/dL Final   HCT 09/15/2019 34.0* 39.0 - 52.0 % Final   MCV 09/15/2019 100.6* 80.0 - 100.0 fL Final   MCH 09/15/2019 35.2* 26.0 - 34.0 pg Final   MCHC 09/15/2019 35.0  30.0 - 36.0 g/dL Final   RDW 09/15/2019 13.3  11.5 - 15.5 % Final   Platelets 09/15/2019 184  150 - 400 K/uL Final   nRBC 09/15/2019 0.0  0.0 - 0.2 % Final   Neutrophils Relative % 09/15/2019 81  % Final   Neutro Abs  09/15/2019 7.9* 1.7 - 7.7 K/uL Final   Lymphocytes Relative 09/15/2019 13  % Final   Lymphs Abs 09/15/2019 1.3  0.7 - 4.0 K/uL Final   Monocytes Relative 09/15/2019 5  % Final   Monocytes Absolute 09/15/2019 0.4  0.1 - 1.0 K/uL Final   Eosinophils Relative 09/15/2019 1  % Final   Eosinophils Absolute 09/15/2019 0.1  0.0 - 0.5 K/uL Final   Basophils Relative 09/15/2019 0  % Final   Basophils Absolute 09/15/2019 0.0  0.0 - 0.1 K/uL Final   Immature Granulocytes 09/15/2019 0  % Final   Abs Immature Granulocytes 09/15/2019 0.04  0.00 - 0.07 K/uL Final   Performed at [facility], [address], [place], [place] [address]    Assessment:  Logan Perez is a 73 y.o. male with stage III mutiple myeloma. He initially presented with progressive back pain beginning in 12/2006. MRI revealed "spots and compression fractures". He began Velcade, thalidomide, and Decadron. In 08/2007, he underwent high dose chemotherapy and autologous stem cell transplant. 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  He recurred with a rising M-spike (2.7) with repeat M spike (1.7 gm/dl) in 03/2010. He was initially treated with Velcade (02/08/2010 - 05/10/2010). He then began Revlimid (15 mg 3 weeks on/1 week off) and Decadron (40 mg on day 1, 8, 15, 22). Because of significant side effect with Decadron his dose was decreased to 10 mg once a week in 07/2010.   He was on maintenance Revlimid. Revlimid was initially10 mg 3 weeks on/1 week off. This was changed to 10 mg 2 weeks on/2 weeks off secondary to right nipple tenderness. His dose was increased to 10 mg 3 weeks on/1 week off with Decadron 10 mg a week (on Sundays) and then Revlamid 15 mg 3 weeks on and 1 week off with Decadron on Sundays. He began Pomalyst4 mg 3 weeks on/1 week off with Decadron on 08/27/2015.  Over the past year his SPEPhas revealed no monoclonal protein (04/21/2015) and 0.5 gm/dL on  09/22/2015 and 10/20/2015. M spikewas 0.1 on 02/02/2016, 03/01/2016, 03/29/2016, 04/26/2016, and 05/24/2016. M spikewas 0 on 10/11/2016, 11/28/2016, 02/05/2017, 03/21/2017, 08/02/2017, 10/04/2017, 12/27/2017, 01/24/2018, and 02/21/2018. M spikewas 0.1 on 11/01/2017, 0.1 on 11/29/2017, 0 on 12/27/2017, 0 on 01/24/2018, 0 on 02/21/2018, 0.1 on 03/21/2018, 0.3 on 04/18/2018, 0.1 on 05/16/2018, 0 on 06/13/2018, 0.2 on 08/08/2018, 0 on 09/09/2018, 0.1 on 11/04/2018, 0 on 12/02/2018, 0.1 on 12/30/2018, 0 on 01/27/2019, 0.1 on 02/24/2019, 0.1 on 03/24/2019, 0.1 on 04/23/2019, 0 on 05/19/2019, 0.1 on 06/17/2019, 0.2 on 07/15/2019, 0.2 on 08/18/2019, and 0.2 on 09/15/2019.  Free light chainshave been monitored. Kappa free  light chainswere 18.54 on 11/21/2013, 18.37 on 02/20/2014, 18.93 on 05/22/2014, 32.58 (high; normal ratio 1.27) on 08/21/2014, 51.53 (high; elevated ratio of 2.12) on 11/13/2014, 28.08 (ratio 1.73) on 12/09/2014, 23.71 (ratio 2.17) on 01/18/2015, 92.93 (ratio 9.49) on 04/21/2015, 93.44 (ratio 10.28) on 05/26/2015, 255.45 (ratio of 24.05) on 07/14/2015, 373.89 (ratio 48.31) on 08/04/2015, 474.33 (ratio 70.58) on 08/25/2015, 450.76 (ratio 55.44) on 09/22/2015, 453.4 (ratio 59.89) on 10/20/2015, 58.26 (ratio of >40.74) on 02/02/2016, 62.67 (ratio of 37.98) on 03/01/2016, 75.7 (ratio >50.47) on 03/29/2016, 79.7 (ratio 53.13) on 04/26/2016, 108.6 (ratio >72.4) on 05/24/2016, 8.3 (1.46 ratio) on 10/11/2016, 6.7 (0.81 ratio) on 02/05/2017, 7.8 (1.01 ratio) on 03/21/2017,7.6 (ratio 0.63) on 06/01/2017, 8.1 (ratio 1.13) on 11/29/2017, 9.6 (ratio 1.55) on 06/13/2018, 6.6 (ratio 2.06) on 07/11/2018, 6.9 (ratio 0.82) on 08/08/2018, 5.8 (ratio 1.66) on 09/09/2018, 5.8 (ratio 1.05) on 11/04/2018, 5.6(ratio 2.55)on 12/02/2018, 5.5 (ratio 1.96) on 01/27/2019, and 7.6 (ratio 2.38) on 06/17/2019.  Bone surveyon 12/08/2014 was stable. Bone survey on 10/21/2015 revealed increase conspicuity of  subcentimeter lytic lesions in the calvarium.  Bone marrow aspirate and biopsyon 11/04/2015 revealed an atypical monoclonal plasma cells estimated at 30-40% of marrow cells. Marrow was variably cellular (approximately 45%) with background trilineage hematopoiesis. There was no significant increase in marrow reticulin fibers. Storage iron was present.   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 He develoed herpes zoster in 04/2008. He developed a pulmonary embolismin 05/2013. He was initially on Xarelto, but is now on Eliquis. He had an episode of pneumonia around this time requiring a brief admission. He developed severe lower leg cramps on 08/07/2014 secondary to hypokalemia. Duplex was negative.   He was treated forC difficile colitis(Flagyl completed 07/30/2015). He has a chronic indwelling pain pump.  He received 4 cycles of Pomalyst and Decadron(08/27/2015 - 11/19/2015). Restaging studies document progressive disease. Kappa free light chains are increasing. SPEP revealed 0.5 gm/dL monoclonal protein then 1.3 gm/dL. Bone survey reveals increase conspicuity of subcentimeter lytic lesions in the calvarium. Bone marrow reveals 30-40% plasma cells.   MUGAon 11/30/2015 revealed an ejection fraction of 46%. He is felt not to be a good candidate for Kyprolis. There were no focal wall motion abnormalities. He had a stress echo less than 1 year ago. He has a history of PVCs and atrial fibrillation. He takes Cardizem prn.  He received 17 weeks of daratumumab(Darzalex) (12/09/2015 - 05/25/2016). He tolerated treatment well without side effect.  Bone marrow on 02/09/2016 revealed no diagnostic morphologic evidence of plasma cell myeloma. Marrow was normocellular to hypocellular marrow for age (ranging from 10-40%) with maturing trilineage hematopoiesis and mild multilineage dyspoiesis. There was patchy mild increase in  reticulin. Storage iron was present. Flow cytometry revealed no definitive evidence of monoclonality. There was a non-specific atypical myeloid and monocytic findings with no increase in blasts. Cytogenetics were normal (46, XY).  He is currently77month s/p 2nd autologous stem cell transplanton 06/16/2016. 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 He also experienced atrial fibrillation with intermittent episodes of RVR requiring IV beta blockers. He is on prophylactic valacyclovir for 1 year post transplant.  He started vaccinations(DTaP-Pediatric triple vaccine, Hep B- Pediatrix triple vaccine, Haemophilus influenza B (Hib), inactivated polio virus (IPV), pneumococcal conjugate vaccine 13-valent (PCV 13)) on 04/17/2017. Recommendation was for Shingrix vaccine to be given in BHaywardand second dose of Shingrix in 2 months. He had follow-up vaccinations on 08/28/2017. He had his second shingles  vaccine. He received 18 month vaccinations- DTaP-Pediatric triple vaccine, Hep B- Pediatrix triple vaccine, Haemophilus influenza B (Hib), inactivated polio virus (IPV), pneumococcal conjugate vaccine 13-valent (PCV 13) on 02/05/2018.  He is s/p34cycles ofdaratumumab(Darzalex) post transplant(12/05/2016 - 08/18/2019).  PET scanon 08/14/2018 revealed no evidence of active myeloma on whole-body FDG PET scan. There was no evidence soft tissue plasmacytoma. There was no clear evidence of lytic lesions on the CT portion exam. Some lucencies in the pelvis were stable. There were chronic compression fractures in the lower thoracic spine.  He has a history of hypomagnesemiathat required IV magnesium (2-4 gm) weekly. He has not required magnesium since 10/24/2016. Patient has atrial fibrillationand is on Eliquis.  Symptomatically, he feels "alright".  He denies any interim infections.  He has chronic loose stools (2/day well managed on  minimal Imodium).  Exam is stable.  Plan: 1.   Labs today: CBC with diff, CMP, Mg, SPEP. 2.Multiple myeloma Clinically, he continues to do well. M spike continues to fluctuate between 0-0.2 gm/dL over the past several months. M-spike was 0.2 on 09/15/2019. PET scan on 08/14/2018 revealed no evidence of active myeloma.  Consider bone survey at next visit. Labs reviewed.  Blood counts and electrolytes stable for treatment. He took his premedications today. Cycle #35 daratumumab today. Continuevalacyclovir prophylaxis until 3 months s/pdaratumumab. His is no longer followed in the transplant center. Continue periodic phone follow-up with Dr. Adriana Simas at [facility]. Discuss symptom management.  He has antiemetics and pain medications at home to use on a prn bases.  Interventions are adequate.        3. Macrocytic anemia Hematocrit34.0. Hemoglobin11.9. MCV100.6. B12, folate and TSH were normal in04/20/2020. If hemoglobin drops next month, will add additional labs for evaluation.  Continue to monitor. 4.Neuropathy Patient has a stable grade I neuropathy. Patient decided not to pursue Lyrica. Continue to monitor. 5.Chronic diarrhea Patient with chronic loose stools (up to 2/day). He uses Imodium if he has 3 stools/day. Continue to monitir. 6.History of pulmonary embolism(2014) Continue Eliquis. 7.   Hypokalemia Potassium 3.8.             He remains on 20 meq a day.  Continue to monitor. 8.   RTC in 1 month for MD assessment, labs (CBC with diff, CMP, Mg, SPEP), and cycle #36 durvalumab.  I discussed the assessment and treatment plan with the patient.  The patient was provided an opportunity to ask questions and all were answered.  The patient agreed with the plan and demonstrated an understanding of the instructions.  The patient was advised to call back if the symptoms worsen or if  the condition fails to improve as anticipated.   Lequita Asal, MD, PhD    09/15/2019, 10:29 AM  I, Samul Dada, am acting as a scribe for Lequita Asal, MD.  I, [place] Mike Gip, MD, have reviewed the above documentation for accuracy and completeness, and I agree with the above.

## 2019-09-15 ENCOUNTER — Encounter: Payer: Self-pay | Admitting: Hematology and Oncology

## 2019-09-15 ENCOUNTER — Inpatient Hospital Stay: Payer: Medicare Other

## 2019-09-15 ENCOUNTER — Other Ambulatory Visit: Payer: Self-pay

## 2019-09-15 ENCOUNTER — Inpatient Hospital Stay: Payer: Medicare Other | Attending: Hematology and Oncology | Admitting: Hematology and Oncology

## 2019-09-15 VITALS — BP 113/72 | HR 82 | Temp 97.7°F | Resp 18 | Ht 67.0 in | Wt 163.8 lb

## 2019-09-15 VITALS — BP 157/73 | HR 85 | Resp 18

## 2019-09-15 DIAGNOSIS — Z5112 Encounter for antineoplastic immunotherapy: Secondary | ICD-10-CM

## 2019-09-15 DIAGNOSIS — Z79899 Other long term (current) drug therapy: Secondary | ICD-10-CM | POA: Insufficient documentation

## 2019-09-15 DIAGNOSIS — C9000 Multiple myeloma not having achieved remission: Secondary | ICD-10-CM

## 2019-09-15 DIAGNOSIS — I2782 Chronic pulmonary embolism: Secondary | ICD-10-CM | POA: Diagnosis not present

## 2019-09-15 DIAGNOSIS — K529 Noninfective gastroenteritis and colitis, unspecified: Secondary | ICD-10-CM

## 2019-09-15 DIAGNOSIS — C9002 Multiple myeloma in relapse: Secondary | ICD-10-CM

## 2019-09-15 DIAGNOSIS — D539 Nutritional anemia, unspecified: Secondary | ICD-10-CM | POA: Diagnosis not present

## 2019-09-15 LAB — COMPREHENSIVE METABOLIC PANEL
ALT: 13 U/L (ref 0–44)
AST: 18 U/L (ref 15–41)
Albumin: 4.1 g/dL (ref 3.5–5.0)
Alkaline Phosphatase: 39 U/L (ref 38–126)
Anion gap: 11 (ref 5–15)
BUN: 19 mg/dL (ref 8–23)
CO2: 21 mmol/L — ABNORMAL LOW (ref 22–32)
Calcium: 8.9 mg/dL (ref 8.9–10.3)
Chloride: 102 mmol/L (ref 98–111)
Creatinine, Ser: 1.58 mg/dL — ABNORMAL HIGH (ref 0.61–1.24)
GFR calc Af Amer: 50 mL/min — ABNORMAL LOW (ref >60)
GFR calc non Af Amer: 43 mL/min — ABNORMAL LOW (ref >60)
Glucose, Bld: 139 mg/dL — ABNORMAL HIGH (ref 70–99)
Potassium: 3.8 mmol/L (ref 3.5–5.1)
Sodium: 134 mmol/L — ABNORMAL LOW (ref 135–145)
Total Bilirubin: 0.8 mg/dL (ref 0.3–1.2)
Total Protein: 5.9 g/dL — ABNORMAL LOW (ref 6.5–8.1)

## 2019-09-15 LAB — CBC WITH DIFFERENTIAL/PLATELET
Abs Immature Granulocytes: 0.04 10*3/uL (ref 0.00–0.07)
Basophils Absolute: 0 10*3/uL (ref 0.0–0.1)
Basophils Relative: 0 %
Eosinophils Absolute: 0.1 10*3/uL (ref 0.0–0.5)
Eosinophils Relative: 1 %
HCT: 34 % — ABNORMAL LOW (ref 39.0–52.0)
Hemoglobin: 11.9 g/dL — ABNORMAL LOW (ref 13.0–17.0)
Immature Granulocytes: 0 %
Lymphocytes Relative: 13 %
Lymphs Abs: 1.3 10*3/uL (ref 0.7–4.0)
MCH: 35.2 pg — ABNORMAL HIGH (ref 26.0–34.0)
MCHC: 35 g/dL (ref 30.0–36.0)
MCV: 100.6 fL — ABNORMAL HIGH (ref 80.0–100.0)
Monocytes Absolute: 0.4 10*3/uL (ref 0.1–1.0)
Monocytes Relative: 5 %
Neutro Abs: 7.9 10*3/uL — ABNORMAL HIGH (ref 1.7–7.7)
Neutrophils Relative %: 81 %
Platelets: 184 10*3/uL (ref 150–400)
RBC: 3.38 MIL/uL — ABNORMAL LOW (ref 4.22–5.81)
RDW: 13.3 % (ref 11.5–15.5)
WBC: 9.7 10*3/uL (ref 4.0–10.5)
nRBC: 0 % (ref 0.0–0.2)

## 2019-09-15 LAB — MAGNESIUM: Magnesium: 1.9 mg/dL (ref 1.7–2.4)

## 2019-09-15 MED ORDER — SODIUM CHLORIDE 0.9 % IV SOLN
16.2000 mg/kg | Freq: Once | INTRAVENOUS | Status: AC
  Administered 2019-09-15: 11:00:00 1200 mg via INTRAVENOUS
  Filled 2019-09-15: qty 60

## 2019-09-15 MED ORDER — SODIUM CHLORIDE 0.9 % IV SOLN
Freq: Once | INTRAVENOUS | Status: AC
  Administered 2019-09-15: 11:00:00 via INTRAVENOUS
  Filled 2019-09-15: qty 250

## 2019-09-15 MED ORDER — HEPARIN SOD (PORK) LOCK FLUSH 100 UNIT/ML IV SOLN
500.0000 [IU] | Freq: Once | INTRAVENOUS | Status: AC
  Administered 2019-09-15: 13:00:00 500 [IU] via INTRAVENOUS

## 2019-09-15 MED ORDER — SODIUM CHLORIDE 0.9% FLUSH
10.0000 mL | INTRAVENOUS | Status: DC | PRN
  Administered 2019-09-15: 09:00:00 10 mL via INTRAVENOUS
  Filled 2019-09-15: qty 10

## 2019-09-15 NOTE — Patient Instructions (Signed)
Daratumumab injection What is this medicine? DARATUMUMAB (dar a toom ue mab) is a monoclonal antibody. It is used to treat multiple myeloma. This medicine may be used for other purposes; ask your health care provider or pharmacist if you have questions. COMMON BRAND NAME(S): DARZALEX What should I tell my health care provider before I take this medicine? They need to know if you have any of these conditions:  infection (especially a virus infection such as chickenpox, herpes, or hepatitis B virus)  lung or breathing disease  an unusual or allergic reaction to daratumumab, other medicines, foods, dyes, or preservatives  pregnant or trying to get pregnant  breast-feeding How should I use this medicine? This medicine is for infusion into a vein. It is given by a health care professional in a hospital or clinic setting. Talk to your pediatrician regarding the use of this medicine in children. Special care may be needed. Overdosage: If you think you have taken too much of this medicine contact a poison control center or emergency room at once. NOTE: This medicine is only for you. Do not share this medicine with others. What if I miss a dose? Keep appointments for follow-up doses as directed. It is important not to miss your dose. Call your doctor or health care professional if you are unable to keep an appointment. What may interact with this medicine? Interactions have not been studied. This list may not describe all possible interactions. Give your health care provider a list of all the medicines, herbs, non-prescription drugs, or dietary supplements you use. Also tell them if you smoke, drink alcohol, or use illegal drugs. Some items may interact with your medicine. What should I watch for while using this medicine? This drug may make you feel generally unwell. Report any side effects. Continue your course of treatment even though you feel ill unless your doctor tells you to stop. This  medicine can cause serious allergic reactions. To reduce your risk you may need to take medicine before treatment with this medicine. Take your medicine as directed. This medicine can affect the results of blood tests to match your blood type. These changes can last for up to 6 months after the final dose. Your healthcare provider will do blood tests to match your blood type before you start treatment. Tell all of your healthcare providers that you are being treated with this medicine before receiving a blood transfusion. This medicine can affect the results of some tests used to determine treatment response; extra tests may be needed to evaluate response. Do not become pregnant while taking this medicine or for 3 months after stopping it. Women should inform their doctor if they wish to become pregnant or think they might be pregnant. There is a potential for serious side effects to an unborn child. Talk to your health care professional or pharmacist for more information. What side effects may I notice from receiving this medicine? Side effects that you should report to your doctor or health care professional as soon as possible:  allergic reactions like skin rash, itching or hives, swelling of the face, lips, or tongue  breathing problems  chills  cough  dizziness  feeling faint or lightheaded  headache  low blood counts - this medicine may decrease the number of white blood cells, red blood cells and platelets. You may be at increased risk for infections and bleeding.  nausea, vomiting  shortness of breath  signs of decreased platelets or bleeding - bruising, pinpoint red spots on  the skin, black, tarry stools, blood in the urine  signs of decreased red blood cells - unusually weak or tired, feeling faint or lightheaded, falls  signs of infection - fever or chills, cough, sore throat, pain or difficulty passing urine  signs and symptoms of liver injury like dark yellow or brown  urine; general ill feeling or flu-like symptoms; light-colored stools; loss of appetite; right upper belly pain; unusually weak or tired; yellowing of the eyes or skin Side effects that usually do not require medical attention (report to your doctor or health care professional if they continue or are bothersome):  back pain  constipation  loss of appetite  diarrhea  joint pain  muscle cramps  pain, tingling, numbness in the hands or feet  swelling of the ankles, feet, hands  tiredness  trouble sleeping This list may not describe all possible side effects. Call your doctor for medical advice about side effects. You may report side effects to FDA at 1-800-FDA-1088. Where should I keep my medicine? Keep out of the reach of children. This drug is given in a hospital or clinic and will not be stored at home. NOTE: This sheet is a summary. It may not cover all possible information. If you have questions about this medicine, talk to your doctor, pharmacist, or health care provider.  2020 Elsevier/Gold Standard (2018-07-11 14:00:48)

## 2019-09-15 NOTE — Progress Notes (Signed)
Patient reports he took his decadron, tylenol, and benadryl this morning.

## 2019-09-15 NOTE — Progress Notes (Signed)
No new changes noted today 

## 2019-09-15 NOTE — Progress Notes (Signed)
09/15/19  Confirmed patient took oral premeds at home prior to daratumumab.  Marcell Anger RN/Synda Bagent Ronnald Ramp, PharmD

## 2019-09-16 LAB — PROTEIN ELECTROPHORESIS, SERUM
A/G Ratio: 1.7 (ref 0.7–1.7)
Albumin ELP: 3.7 g/dL (ref 2.9–4.4)
Alpha-1-Globulin: 0.1 g/dL (ref 0.0–0.4)
Alpha-2-Globulin: 0.7 g/dL (ref 0.4–1.0)
Beta Globulin: 0.9 g/dL (ref 0.7–1.3)
Gamma Globulin: 0.5 g/dL (ref 0.4–1.8)
Globulin, Total: 2.2 g/dL (ref 2.2–3.9)
M-Spike, %: 0.2 g/dL — ABNORMAL HIGH
Total Protein ELP: 5.9 g/dL — ABNORMAL LOW (ref 6.0–8.5)

## 2019-10-06 ENCOUNTER — Other Ambulatory Visit: Payer: Self-pay | Admitting: Hematology and Oncology

## 2019-10-06 DIAGNOSIS — C9001 Multiple myeloma in remission: Secondary | ICD-10-CM

## 2019-10-07 ENCOUNTER — Other Ambulatory Visit: Payer: Self-pay

## 2019-10-07 MED ORDER — PAIN MANAGEMENT IT PUMP REFILL: 1.0000 | Freq: Once | INTRATHECAL | 0 refills | Status: DC

## 2019-10-09 NOTE — Progress Notes (Signed)
[facility]  [address], [address] [place], [place] [address] Phone: [phone]  Fax: [phone]   Clinic Day:  10/13/2019  Referring physician: Sofie Hartigan, MD  Chief Complaint: Logan Perez is a 73 y.o. 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    HPI: The patient was last seen in the medical oncology clinic on 09/15/2019. At that time, he felt "alright".  He denied any interim infections.  He had chronic loose stools (2/day well managed on minimal Imodium).  Exam was stable. M-spike was 0.2 gm/dL.  Hematocrit was 34.0, hemoglobin 11.9, MCV 100.6, platelets 184,000, WBC 9700 with an ANC of 7900.  Creatinine was 1.58.  Calcium was 8.9.  He received daratumumab  During the interim, he has been doing the same.  He knows what triggers his diarrhea, which are greasy food and dairy.  Symptomatically, he has had nausea and bloating for the past 3 weeks. He has been "plagued with diarrhea after a regular bowel movement."  He was taking 9-10 imodium tablets but stopped thinking it was contributing to his nausea and bloating.  He was only taking 6 on an average day; he was taken 3 tablets at a time x 2.  He hasn't taken any Imodium in the past week. Since then, he has a normal solid BM with 1-2 lose BMs later on in the day.    He says back in his 20's he had issues with diarrhea, it eventually went away.  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   His GI physician is Dr. Allen Norris; he hasn't seen him since 10/07/2018.  He has been taking dramamine 50 mg which helps with his nausea for 3-4 hours.  His restless leg syndrome is back and is taking medicine to help. He notes cold toes at night and uses a heating pad to help.  He take 2 Tylenol in the morning to help with back pain.  He gets his pain pump refilled on 10/16/2019.  He has restless  legs.   Past Medical History:  Diagnosis Date  . Anxiety   . Atrial fibrillation ([place])   . BPH (benign prostatic hyperplasia)   . Complication of anesthesia    BAD HEADACHE NIGHT OF FIRST CATARACT  . Compression fracture of lumbar vertebra ([place])   . Difficulty voiding   . Dysrhythmia    A FIB  . Elevated PSA   . GERD (gastroesophageal reflux disease)   . Hearing aid worn    bilateral  . History of kidney stones   . HLD (hyperlipidemia)   . HOH (hard of hearing)   . Hypertension   . Multiple myeloma ([place])   . Neuropathy    feet. 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  . Pain    BACK  . Palpitations   . Pneumonia   . Pulmonary embolism ([place])   . Sepsis ([place])   . Stroke [facility])    TIA, detected on CT scan. pt was unaware    Past Surgical History:  Procedure Laterality Date  . BACK SURGERY  1994  . CATARACT EXTRACTION W/PHACO Right 05/02/2016   Procedure: CATARACT EXTRACTION PHACO AND INTRAOCULAR LENS PLACEMENT (IOC);  Surgeon: Birder Robson, MD;  Location: [facility] ORS;  Service: Ophthalmology;  Laterality: Right;  [place] 1.06 AP% 20.6 CDE 13.70 FLUID PACK LOT # [id number]  . CATARACT EXTRACTION W/PHACO Left 05/16/2016   Procedure:  CATARACT EXTRACTION PHACO AND INTRAOCULAR LENS PLACEMENT (IOC);  Surgeon: Birder Robson, MD;  Location: [facility] ORS;  Service: Ophthalmology;  Laterality: Left;  [place] 01:43 AP% 19.8 CDE 20.45 FLUID PACK LOT #[id number] H  . COLONOSCOPY WITH PROPOFOL N/A 03/22/2017   Procedure: COLONOSCOPY WITH PROPOFOL;  Surgeon: Lucilla Lame, MD;  Location: [place];  Service: Endoscopy;  Laterality: N/A;  has port  . ESOPHAGOGASTRODUODENOSCOPY (EGD) WITH PROPOFOL  03/22/2017   Procedure: ESOPHAGOGASTRODUODENOSCOPY (EGD) WITH PROPOFOL;  Surgeon: Lucilla Lame, MD;  Location: [place];  Service: Endoscopy;;  . EYE SURGERY    . KNEE ARTHROSCOPY Left 1992  . LIMBAL STEM CELL TRANSPLANT    . PAIN PUMP IMPLANTATION  2012  . PAIN PUMP IMPLANTATION N/A 09/04/2017   Procedure:  INTRATHECAL PUMP BATTERY CHANGE;  Surgeon: Milinda Pointer, MD;  Location: [facility];  Service: Neurosurgery;  Laterality: N/A;  . PORTA CATH INSERTION N/A 12/04/2016   Procedure: Glori Luis Cath Insertion;  Surgeon: Algernon Huxley, MD;  Location: [facility];  Service: Cardiovascular;  Laterality: N/A;  . stem cell implant  2008   [facility]    Family History  Problem Relation Age of [place]  . Cancer Father        throat  . Kidney disease Sister   . Stroke Other   . Stroke Mother   . Bladder Cancer Neg Hx   . Prostate cancer Neg Hx   . Kidney cancer Neg Hx     Social History:  reports that he quit smoking about 44 years ago. His smoking use included cigarettes. He has a 30.00 pack-year smoking history. He has never used smokeless tobacco. He reports that he does not drink alcohol or use drugs. He has a wire haired dachshund. He lives in [place]. His wife's name is [name]. The patient is alone today.  Allergies:  Allergies  Allergen Reactions  . Azithromycin Diarrhea and Other (See Comments)    Possible cause of C. Diff Possible cause of C. Diff Possible cause of C. Diff Possible cause of C. Diff  . Bee Pollen Other (See Comments)    Sneezing, watery eyes, runny nose  . Pollen Extract Other (See Comments)    Sneezing, watery eyes, runny nose  . 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 Rivaroxaban Rash    Current Medications: Current Outpatient Medications  Medication Sig Dispense Refill  . acetaminophen (TYLENOL) 500 MG tablet Take 500 mg by mouth 2 (two) times a day.    . baclofen (LIORESAL) 10 MG tablet Take 1 tablet (10 mg total) by mouth 2 (two) times daily. 180 tablet 3  . Calcium Carb-Cholecalciferol (CALCIUM-VITAMIN D) 500-400 MG-UNIT TABS Take 1 tablet daily by mouth.    . cetirizine (ZYRTEC) 10 MG tablet Take 10 mg by mouth daily as needed for allergies.     . Cholecalciferol (VITAMIN D3) 2000 units capsule  Take 2,000 Units daily by mouth.     . Daratumumab (DARZALEX IV) Inject 1,200 mg every 30 (thirty) days into the vein.     [name] dexamethasone (DECADRON) 4 MG tablet TAKE 4 TABLETS BY MOUTH 1 HOUR PRIOR TO INFUSION, THEN 1 TABLET ON DAYS 2 AND 3 AS DIRECTED 30 tablet 0  . diltiazem (CARDIZEM CD) 120 MG 24 hr capsule Take 120 mg daily by mouth.     . diltiazem (CARDIZEM) 60 MG tablet Take 60 mg daily as needed by mouth (for  increased heart rate > 140).     [name] dimenhyDRINATE (DRAMAMINE) 50 MG tablet Take 50 mg by mouth every 8 (eight) hours as needed.    . diphenhydrAMINE (BENADRYL) 25 MG tablet Take 25 mg by mouth as directed. With chemo treatment    . ELIQUIS 5 MG TABS tablet TAKE 1 TABLET BY MOUTH TWICE DAILY 60 tablet 0  . fluticasone (FLONASE) 50 MCG/ACT nasal spray Place 1 spray into the nose daily as needed for allergies.     . Hypromellose (ARTIFICIAL TEARS OP) Place 1 drop as needed into both eyes (for dry eyes).    [name] loperamide (IMODIUM) 2 MG capsule Take 4 mg as needed by mouth for diarrhea or loose stools.     [name] loratadine (CLARITIN) 10 MG tablet Take 10 mg by mouth daily as needed.     . lovastatin (MEVACOR) 20 MG tablet Take 20 mg by mouth every evening.     . montelukast (SINGULAIR) 10 MG tablet TAKE 1 TABLET BY MOUTH DAILY THE DAY BEFORE THE INFUSION, THEN DAY OF, AND THE 2 DAYS AFTER INFUSION 30 tablet 0  . Multiple Vitamins-Iron (MULTIVITAMIN/IRON PO) Take 1 tablet daily by mouth.    . NON FORMULARY IT pump  Fentanyl 1,500.0 mcg/ml Bupivicaine 30.0 mg/ml Clonidine 300.0 mcg/ml Rate 502.2 mcg/day    . omeprazole (PRILOSEC) 20 MG capsule Take 20 mg by mouth daily.     . potassium chloride SA (KLOR-CON) 20 MEQ tablet Take 1 tablet (20 mEq total) by mouth daily. 90 tablet 0  . tamsulosin (FLOMAX) 0.4 MG CAPS capsule TAKE 1 CAPSULE(0.4 MG) BY MOUTH DAILY 90 capsule 4  . valACYclovir (VALTREX) 500 MG tablet TAKE 1 TABLET BY MOUTH EVERY DAY 30 tablet 3  . vitamin B-12 (CYANOCOBALAMIN)  1000 MCG tablet Take 1,000 mcg daily by mouth.     . bismuth subsalicylate (PEPTO BISMOL) 262 MG/15ML suspension Take 30 mLs by mouth every 6 (six) hours as needed for diarrhea or loose stools.    . ondansetron (ZOFRAN) 8 MG tablet Take 1 tablet (8 mg total) by mouth as directed. Take with chemo and can take it as needed (Patient not taking: Reported on 09/15/2019) 20 tablet 1  . PAIN MANAGEMENT IT PUMP REFILL 1 each by Intrathecal route once for 1 dose. Medication: PF Fentanyl 1,500.0 mcg/ml PF Bupivicaine 30.0 mg/ml PF Clonidine 300.mcg/ml Total Volume: 29m Needed by 07-02-19 @ 1000 1 each 0  . PAIN MANAGEMENT IT PUMP REFILL 1 each by Intrathecal route once for 1 dose. Medication: PF Fentanyl 1,500.0 mcg/ml PF Bupivicaine 30.0 mg/ml PF Clonidine 300.0 mcg/ml Total Volume: 40 ml Needed by 10-16-2019 @ 1000 1 each 0   No current facility-administered medications for this visit.   Facility-Administered Medications Ordered in Other Visits  Medication Dose Route Frequency Provider Last Rate Last Admin  . heparin lock flush 100 unit/mL  500 Units Intravenous Once Cressida Milford C, MD      . sodium chloride flush (NS) 0.9 % injection 10 mL  10 mL Intravenous PRN CLequita Asal MD   10 mL at 10/13/19 0934    Review of Systems  Constitutional: Positive for weight loss ( 1 pound). Negative for chills, diaphoresis, fever and malaise/fatigue.       Feels "ok".  HENT: Positive for hearing loss (forgot hearing aide). Negative for congestion, ear pain, nosebleeds, sinus pain, sore throat and tinnitus.   Eyes: Negative.  Negative for blurred vision, double vision, photophobia and pain.  Respiratory:  Negative.  Negative for cough, hemoptysis, sputum production, shortness of breath and wheezing.   Cardiovascular: Negative for chest pain, palpitations, orthopnea, leg swelling and PND.       H/o atrial fibrillation.  Gastrointestinal: Positive for diarrhea (2 loose stools/day- stopped using  Imodium 10/09/2019) and nausea (using dramamine). Negative for abdominal pain, blood in stool, constipation, heartburn, melena and vomiting.  Genitourinary: Negative.  Negative for dysuria, frequency, hematuria and urgency.  Musculoskeletal: Positive for back pain (chronic; Fentanyl/bupivacaine/clonidine pump). Negative for falls, joint pain, myalgias and neck pain.       Pain pump filled every 14 weeks.  Skin: Negative.  Negative for itching and rash.  Neurological: Positive for sensory change (restless legs). Negative for dizziness, tremors, speech change, focal weakness, seizures, weakness and headaches.  Endo/Heme/Allergies: Negative.  Negative for environmental allergies. Does not bruise/bleed easily.  Psychiatric/Behavioral: Negative.  Negative for depression and memory loss. The patient is not nervous/anxious and does not have insomnia.   All other systems reviewed and are negative.  Performance status (ECOG): 1  Vitals Blood pressure 134/73, pulse 86, temperature (!) 96.1 F (35.6 C), temperature source Tympanic, height '5\' 7"'  (1.702 m), weight 162 lb 14.7 oz (73.9 kg), SpO2 97 %.   Physical Exam  Constitutional: He is oriented to person, place, and time. He appears well-developed and well-nourished. No distress.  HENT:  Head: Normocephalic and atraumatic.  Mouth/Throat: Oropharynx is clear and moist. No oropharyngeal exudate.  Short brown hair with graying. Goatee.  Mask.   Eyes: Pupils are equal, round, and reactive to light. Conjunctivae and EOM are normal. No scleral icterus.  Glasses. Blue eyes.  Neck: No JVD present.  Cardiovascular: Normal rate, regular rhythm, normal heart sounds and intact distal pulses. Exam reveals no gallop.  No murmur heard. Pulmonary/Chest: Effort normal and breath sounds normal. No respiratory distress. He has no wheezes. He has no rales.  Abdominal: Soft. Bowel sounds are normal. He exhibits no distension and no mass. There is no abdominal  tenderness. There is no rebound and no guarding.  Right sided implanted Fentanyl/bupivacaine/clonidine pump.   Musculoskeletal:        General: No tenderness or edema. Normal range of motion.     Cervical back: Normal range of motion and neck supple.  Lymphadenopathy:       Head (right side): No preauricular, no posterior auricular and no occipital adenopathy present.       Head (left side): No preauricular, no posterior auricular and no occipital adenopathy present.    He has no cervical adenopathy.    He has no axillary adenopathy.       Right: No inguinal and no supraclavicular adenopathy present.       Left: No inguinal and no supraclavicular adenopathy present.  Neurological: He is alert and oriented to person, place, and time.  Skin: Skin is warm and dry. No rash noted. He is not diaphoretic. No erythema. No pallor.  Psychiatric: He has a normal mood and affect. His behavior is normal. Judgment and thought content normal.  Nursing note and vitals reviewed.   Infusion on 10/13/2019  Component Date Value Ref Range Status  . WBC 10/13/2019 7.5  4.0 - 10.5 K/uL Final  . RBC 10/13/2019 3.62* 4.22 - 5.81 MIL/uL Final  . Hemoglobin 10/13/2019 12.6* 13.0 - 17.0 g/dL Final  . HCT 10/13/2019 36.6* 39.0 - 52.0 % Final  . MCV 10/13/2019 101.1* 80.0 - 100.0 fL Final  . MCH 10/13/2019 34.8* 26.0 - 34.0 pg  Final  . MCHC 10/13/2019 34.4  30.0 - 36.0 g/dL Final  . RDW 10/13/2019 13.3  11.5 - 15.5 % Final  . Platelets 10/13/2019 202  150 - 400 K/uL Final  . nRBC 10/13/2019 0.0  0.0 - 0.2 % Final  . Neutrophils Relative % 10/13/2019 77  % Final  . Neutro Abs 10/13/2019 5.8  1.7 - 7.7 K/uL Final  . Lymphocytes Relative 10/13/2019 17  % Final  . Lymphs Abs 10/13/2019 1.3  0.7 - 4.0 K/uL Final  . Monocytes Relative 10/13/2019 4  % Final  . Monocytes Absolute 10/13/2019 0.3  0.1 - 1.0 K/uL Final  . Eosinophils Relative 10/13/2019 1  % Final  . Eosinophils Absolute 10/13/2019 0.1  0.0 - 0.5 K/uL  Final  . Basophils Relative 10/13/2019 1  % Final  . Basophils Absolute 10/13/2019 0.0  0.0 - 0.1 K/uL Final  . Immature Granulocytes 10/13/2019 0  % Final  . Abs Immature Granulocytes 10/13/2019 0.03  0.00 - 0.07 K/uL Final   Performed at [facility], [address], [place], [place] [address]  . Sodium 10/13/2019 136  135 - 145 mmol/L Final  . Potassium 10/13/2019 4.0  3.5 - 5.1 mmol/L Final  . Chloride 10/13/2019 106  98 - 111 mmol/L Final  . CO2 10/13/2019 22  22 - 32 mmol/L Final  . Glucose, Bld 10/13/2019 149* 70 - 99 mg/dL Final  . BUN 10/13/2019 18  8 - 23 mg/dL Final  . Creatinine, Ser 10/13/2019 1.44* 0.61 - 1.24 mg/dL Final  . Calcium 10/13/2019 8.7* 8.9 - 10.3 mg/dL Final  . Total Protein 10/13/2019 6.5  6.5 - 8.1 g/dL Final  . Albumin 10/13/2019 4.1  3.5 - 5.0 g/dL Final  . AST 10/13/2019 20  15 - 41 U/L Final  . ALT 10/13/2019 15  0 - 44 U/L Final  . Alkaline Phosphatase 10/13/2019 40  38 - 126 U/L Final  . Total Bilirubin 10/13/2019 0.8  0.3 - 1.2 mg/dL Final  . GFR calc non Af Amer 10/13/2019 48* >60 mL/min Final  . GFR calc Af Amer 10/13/2019 55* >60 mL/min Final  . Anion gap 10/13/2019 8  5 - 15 Final   Performed at [facility], [address] [place], [place] [address]  . Magnesium 10/13/2019 1.8  1.7 - 2.4 mg/dL Final   Performed at [facility], [address], [place], [place] [address]    Assessment:  Logan Perez is a 73 y.o. male  with stage III mutiple myeloma. He initially presented with progressive back pain beginning in 12/2006. MRI revealed "spots and compression fractures". He began Velcade, thalidomide, and Decadron. In 08/2007, he underwent high dose chemotherapy and autologous stem cell transplant. 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  He recurred with a rising M-spike (2.7) with repeat M spike (1.7 gm/dl) in 03/2010. He was initially treated with Velcade (02/08/2010 -  05/10/2010). He then began Revlimid (15 mg 3 weeks on/1 week off) and Decadron (40 mg on day 1, 8, 15, 22). Because of significant side effect with Decadron his dose was decreased to 10 mg once a week in 07/2010.   He was on maintenance Revlimid. Revlimid was initially10 mg 3 weeks on/1 week off. This was changed to 10 mg 2 weeks on/2 weeks off secondary to right nipple tenderness. His dose was increased to 10 mg 3 weeks on/1 week off with Decadron 10 mg a week (on Sundays)  and then Revlamid 15 mg 3 weeks on and 1 week off with Decadron on Sundays. He began Pomalyst4 mg 3 weeks on/1 week off with Decadron on 08/27/2015.  SPEPrevealed no monoclonal protein (04/21/2015) and 0.5 gm/dL on 09/22/2015 and 10/20/2015. M spikewas 0.1 on 02/02/2016, 03/01/2016, 03/29/2016, 04/26/2016, and 05/24/2016. M spikewas 0 on 10/11/2016, 11/28/2016, 02/05/2017, 03/21/2017, 08/02/2017, 10/04/2017, 12/27/2017, 01/24/2018, and 02/21/2018. M spikewas 0.1 on 11/01/2017, 0.1 on 11/29/2017, 0 on 12/27/2017, 0 on 01/24/2018, 0 on 02/21/2018, 0.1 on 03/21/2018, 0.3 on 04/18/2018, 0.1 on 05/16/2018, 0 on 06/13/2018, 0.2 on 08/08/2018, 0 on 09/09/2018, 0.1 on 11/04/2018, 0 on 12/02/2018, 0.1 on 12/30/2018, 0 on 01/27/2019, 0.1 on 02/24/2019, 0.1 on 03/24/2019, 0.1 on 04/23/2019, 0 on 05/19/2019, 0.1 on 06/17/2019, 0.2 on 07/15/2019, 0.2 on 08/18/2019, 0.2 on 09/15/2019, and 0.1 on 10/13/2019.  Free light chainshave been monitored. Kappa free light chainswere 18.54 on 11/21/2013, 18.37 on 02/20/2014, 18.93 on 05/22/2014, 32.58 (high; normal ratio 1.27) on 08/21/2014, 51.53 (high; elevated ratio of 2.12) on 11/13/2014, 28.08 (ratio 1.73) on 12/09/2014, 23.71 (ratio 2.17) on 01/18/2015, 92.93 (ratio 9.49) on 04/21/2015, 93.44 (ratio 10.28) on 05/26/2015, 255.45 (ratio of 24.05) on 07/14/2015, 373.89 (ratio 48.31) on 08/04/2015, 474.33 (ratio 70.58) on 08/25/2015, 450.76 (ratio 55.44) on 09/22/2015, 453.4 (ratio 59.89) on  10/20/2015, 58.26 (ratio of >40.74) on 02/02/2016, 62.67 (ratio of 37.98) on 03/01/2016, 75.7 (ratio >50.47) on 03/29/2016, 79.7 (ratio 53.13) on 04/26/2016, 108.6 (ratio >72.4) on 05/24/2016, 8.3 (1.46 ratio) on 10/11/2016, 6.7 (0.81 ratio) on 02/05/2017, 7.8 (1.01 ratio) on 03/21/2017,7.6 (ratio 0.63) on 06/01/2017, 8.1 (ratio 1.13) on 11/29/2017, 9.6 (ratio 1.55) on 06/13/2018, 6.6 (ratio 2.06) on 07/11/2018, 6.9 (ratio 0.82) on 08/08/2018, 5.8 (ratio 1.66) on 09/09/2018, 5.8 (ratio 1.05) on 11/04/2018, 5.6(ratio 2.55)on 12/02/2018, 5.5 (ratio 1.96) on 01/27/2019, and 7.6 (ratio 2.38) on 06/17/2019.  Bone surveyon 12/08/2014 was stable. Bone survey on 10/21/2015 revealed increase conspicuity of subcentimeter lytic lesions in the calvarium.  Bone marrow aspirate and biopsyon 11/04/2015 revealed an atypical monoclonal plasma cells estimated at 30-40% of marrow cells. Marrow was variably cellular (approximately 45%) with background trilineage hematopoiesis. There was no significant increase in marrow reticulin fibers. Storage iron was present.   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 He develoed herpes zoster in 04/2008. He developed a pulmonary embolismin 05/2013. He was initially on Xarelto, but is now on Eliquis. He had an episode of pneumonia around this time requiring a brief admission. He developed severe lower leg cramps on 08/07/2014 secondary to hypokalemia. Duplex was negative.   He was treated forC difficile colitis(Flagyl completed 07/30/2015). He has a chronic indwelling pain pump.  He received 4 cycles of Pomalyst and Decadron(08/27/2015 - 11/19/2015). Restaging studies document progressive disease. Kappa free light chains are increasing. SPEP revealed 0.5 gm/dL monoclonal protein then 1.3 gm/dL. Bone survey reveals increase conspicuity of subcentimeter lytic lesions in the calvarium. Bone marrow  reveals 30-40% plasma cells.   MUGAon 11/30/2015 revealed an ejection fraction of 46%. He is felt not to be a good candidate for Kyprolis. There were no focal wall motion abnormalities. He had a stress echo less than 1 year ago. He has a history of PVCs and atrial fibrillation. He takes Cardizem prn.  He received 17 weeks of daratumumab(Darzalex) (12/09/2015 - 05/25/2016). He tolerated treatment well without side effect.  Bone marrow on 02/09/2016 revealed no diagnostic morphologic evidence of plasma cell myeloma. Marrow was normocellular to hypocellular marrow for age (ranging from 10-40%) with  maturing trilineage hematopoiesis and mild multilineage dyspoiesis. There was patchy mild increase in reticulin. Storage iron was present. Flow cytometry revealed no definitive evidence of monoclonality. There was a non-specific atypical myeloid and monocytic findings with no increase in blasts. Cytogenetics were normal (46, XY).  He is currently71month s/p 2nd autologous stem cell transplanton 06/16/2016. 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 He also experienced atrial fibrillation with intermittent episodes of RVR requiring IV beta blockers. He is on prophylactic valacyclovir for 1 year post transplant.  He started vaccinations(DTaP-Pediatric triple vaccine, Hep B- Pediatrix triple vaccine, Haemophilus influenza B (Hib), inactivated polio virus (IPV), pneumococcal conjugate vaccine 13-valent (PCV 13)) on 04/17/2017. Recommendation was for Shingrix vaccine to be given in [place] second dose of Shingrix in 2 months. He had follow-up vaccinations on 08/28/2017. He had his second shingles vaccine. He received 18 month vaccinations- DTaP-Pediatric triple vaccine, Hep B- Pediatrix triple vaccine, Haemophilus influenza B (Hib), inactivated polio virus (IPV), pneumococcal conjugate vaccine 13-valent (PCV 13) on 02/05/2018.  He  is s/p35cycles ofdaratumumab(Darzalex) post transplant(12/05/2016 -09/15/2019).  PET scanon 08/14/2018 revealed no evidence of active myeloma on whole-body FDG PET scan. There was no evidence soft tissue plasmacytoma. There was no clear evidence of lytic lesions on the CT portion exam. Some lucencies in the pelvis were stable. There were chronic compression fractures in the lower thoracic spine.  He has a history of hypomagnesemiathat required IV magnesium (2-4 gm) weekly. He has not required magnesium since 10/24/2016. Patient has atrial fibrillationand is on Eliquis.  Symptomatically, he continues to struggle with diarrhea (chonic since tranplant).  Exam is stable.  Plan: 1.   Labs today: CBC with diff, CMP, Mg, SPEP, ferritin. 2.Multiple myeloma Clinically,he continues to do well. M spike continues to fluctuate between 0-0.2 gm/dL over the past several months. M-spikeis 0.1 gm/dL today.  PET scan on 08/14/2018 revealed no evidence of active myeloma.             Discuss bone survey prior to next visit. Labs reviewed.  Blood counts and electrolytes stable for treatment. Premedications confirmed to be taken at home. Cycle #36 daratumumab today.  Continue valacyclovir prophylaxis (until 3 months s/pdaratumumab). His is no longer followed in the transplant center. Continue periodic phone follow-[facility] Discuss symptom management.  He has antiemetics and pain medications at home to use on a prn bases.  Interventions are adequate.       3. Macrocytic anemia Hematocrit36.6. Hemoglobin12.6. MCV101.1. B12, folate and TSH were normal in04/20/2020.             Ferritin 40 today.  Continue to monitor. 4.Neuropathy Patient has a stable grade I neuropathy. Patient decided not to pursue Lyrica. Continue to monitor. 5.Chronic diarrhea Diarrhea ongoing, but appears worse this past  month. Patient notes diarrhea since transplant. He was taking excess amounts of Imodium a day (9-10). Bowel movements appear better in the past week after stopping Imodium. He initially felt greasy foods or diary caused diarrhea.  He has no explanation currently. Review potential daratumumab side effects:  16% diarrhea  27% nausea  14% vomiting. Discuss follow-up with Dr WAllen Norris 6.History of pulmonary embolism(2014) Continue Eliquis. 7.Hypokalemia, resolved Potassium 4.0. He remains on 20 meq a day.             Continue to monitor. 8.   Bone survey. 9.   Please schedule follow-up appt with Dr WAllen Norris(next available). 10.   RTC in  1 month for MD assessment, labs (CBC with diff, CMP, Mg, SPEP, FLCA), and cycle #37 daratumumab.  I discussed the assessment and treatment plan with the patient.  The patient was provided an opportunity to ask questions and all were answered.  The patient agreed with the plan and demonstrated an understanding of the instructions.  The patient was advised to call back if the symptoms worsen or if the condition fails to improve as anticipated.  I provided 32 minutes (10:02 AM - 10:34 AM) of face-to-face time during this this encounter and > 50% was spent counseling as documented under my assessment and plan.    Lequita Asal, MD, PhD    10/13/2019, 10:34 AM  I, Jacqualyn Posey, am acting as Education administrator for [employer]. Mike Gip, MD, PhD.  I, Eluterio Seymour C. Mike Gip, MD, have reviewed the above documentation for accuracy and completeness, and I agree with the above.

## 2019-10-13 ENCOUNTER — Inpatient Hospital Stay: Payer: Medicare Other | Attending: Hematology and Oncology | Admitting: Hematology and Oncology

## 2019-10-13 ENCOUNTER — Encounter: Payer: Self-pay | Admitting: Hematology and Oncology

## 2019-10-13 ENCOUNTER — Inpatient Hospital Stay: Payer: Medicare Other

## 2019-10-13 ENCOUNTER — Other Ambulatory Visit: Payer: Self-pay

## 2019-10-13 VITALS — BP 134/73 | HR 86 | Temp 96.1°F | Ht 67.0 in | Wt 162.9 lb

## 2019-10-13 VITALS — BP 146/88 | HR 83 | Temp 97.0°F | Resp 18

## 2019-10-13 DIAGNOSIS — D539 Nutritional anemia, unspecified: Secondary | ICD-10-CM

## 2019-10-13 DIAGNOSIS — C9002 Multiple myeloma in relapse: Secondary | ICD-10-CM | POA: Insufficient documentation

## 2019-10-13 DIAGNOSIS — C9000 Multiple myeloma not having achieved remission: Secondary | ICD-10-CM

## 2019-10-13 DIAGNOSIS — G2581 Restless legs syndrome: Secondary | ICD-10-CM

## 2019-10-13 DIAGNOSIS — Z5112 Encounter for antineoplastic immunotherapy: Secondary | ICD-10-CM | POA: Insufficient documentation

## 2019-10-13 DIAGNOSIS — I2782 Chronic pulmonary embolism: Secondary | ICD-10-CM | POA: Diagnosis not present

## 2019-10-13 DIAGNOSIS — R197 Diarrhea, unspecified: Secondary | ICD-10-CM

## 2019-10-13 DIAGNOSIS — Z79899 Other long term (current) drug therapy: Secondary | ICD-10-CM | POA: Diagnosis not present

## 2019-10-13 LAB — COMPREHENSIVE METABOLIC PANEL
ALT: 15 U/L (ref 0–44)
AST: 20 U/L (ref 15–41)
Albumin: 4.1 g/dL (ref 3.5–5.0)
Alkaline Phosphatase: 40 U/L (ref 38–126)
Anion gap: 8 (ref 5–15)
BUN: 18 mg/dL (ref 8–23)
CO2: 22 mmol/L (ref 22–32)
Calcium: 8.7 mg/dL — ABNORMAL LOW (ref 8.9–10.3)
Chloride: 106 mmol/L (ref 98–111)
Creatinine, Ser: 1.44 mg/dL — ABNORMAL HIGH (ref 0.61–1.24)
GFR calc Af Amer: 55 mL/min — ABNORMAL LOW (ref >60)
GFR calc non Af Amer: 48 mL/min — ABNORMAL LOW (ref >60)
Glucose, Bld: 149 mg/dL — ABNORMAL HIGH (ref 70–99)
Potassium: 4 mmol/L (ref 3.5–5.1)
Sodium: 136 mmol/L (ref 135–145)
Total Bilirubin: 0.8 mg/dL (ref 0.3–1.2)
Total Protein: 6.5 g/dL (ref 6.5–8.1)

## 2019-10-13 LAB — CBC WITH DIFFERENTIAL/PLATELET
Abs Immature Granulocytes: 0.03 10*3/uL (ref 0.00–0.07)
Basophils Absolute: 0 10*3/uL (ref 0.0–0.1)
Basophils Relative: 1 %
Eosinophils Absolute: 0.1 10*3/uL (ref 0.0–0.5)
Eosinophils Relative: 1 %
HCT: 36.6 % — ABNORMAL LOW (ref 39.0–52.0)
Hemoglobin: 12.6 g/dL — ABNORMAL LOW (ref 13.0–17.0)
Immature Granulocytes: 0 %
Lymphocytes Relative: 17 %
Lymphs Abs: 1.3 10*3/uL (ref 0.7–4.0)
MCH: 34.8 pg — ABNORMAL HIGH (ref 26.0–34.0)
MCHC: 34.4 g/dL (ref 30.0–36.0)
MCV: 101.1 fL — ABNORMAL HIGH (ref 80.0–100.0)
Monocytes Absolute: 0.3 10*3/uL (ref 0.1–1.0)
Monocytes Relative: 4 %
Neutro Abs: 5.8 10*3/uL (ref 1.7–7.7)
Neutrophils Relative %: 77 %
Platelets: 202 10*3/uL (ref 150–400)
RBC: 3.62 MIL/uL — ABNORMAL LOW (ref 4.22–5.81)
RDW: 13.3 % (ref 11.5–15.5)
WBC: 7.5 10*3/uL (ref 4.0–10.5)
nRBC: 0 % (ref 0.0–0.2)

## 2019-10-13 LAB — MAGNESIUM: Magnesium: 1.8 mg/dL (ref 1.7–2.4)

## 2019-10-13 LAB — IRON AND TIBC
Iron: 114 ug/dL (ref 45–182)
Saturation Ratios: 37 % (ref 17.9–39.5)
TIBC: 312 ug/dL (ref 250–450)
UIBC: 198 ug/dL

## 2019-10-13 LAB — FERRITIN: Ferritin: 40 ng/mL (ref 24–336)

## 2019-10-13 MED ORDER — SODIUM CHLORIDE 0.9 % IV SOLN
Freq: Once | INTRAVENOUS | Status: AC
  Filled 2019-10-13: qty 250

## 2019-10-13 MED ORDER — HEPARIN SOD (PORK) LOCK FLUSH 100 UNIT/ML IV SOLN
500.0000 [IU] | Freq: Once | INTRAVENOUS | Status: AC
  Administered 2019-10-13: 500 [IU] via INTRAVENOUS
  Filled 2019-10-13: qty 5

## 2019-10-13 MED ORDER — SODIUM CHLORIDE 0.9% FLUSH
10.0000 mL | INTRAVENOUS | Status: DC | PRN
  Administered 2019-10-13: 10 mL via INTRAVENOUS
  Filled 2019-10-13: qty 10

## 2019-10-13 MED ORDER — SODIUM CHLORIDE 0.9 % IV SOLN
16.2000 mg/kg | Freq: Once | INTRAVENOUS | Status: AC
  Administered 2019-10-13: 1200 mg via INTRAVENOUS
  Filled 2019-10-13: qty 60

## 2019-10-13 NOTE — Progress Notes (Signed)
The patient c/o bloating and having Nausea which he has been taking dimenhydrinate 50 mg

## 2019-10-13 NOTE — Progress Notes (Signed)
Patient verbalized taking all of his pre meds prior to coming into clinic this morning.

## 2019-10-14 LAB — PROTEIN ELECTROPHORESIS, SERUM
A/G Ratio: 1.9 — ABNORMAL HIGH (ref 0.7–1.7)
Albumin ELP: 3.9 g/dL (ref 2.9–4.4)
Alpha-1-Globulin: 0.1 g/dL (ref 0.0–0.4)
Alpha-2-Globulin: 0.7 g/dL (ref 0.4–1.0)
Beta Globulin: 0.8 g/dL (ref 0.7–1.3)
Gamma Globulin: 0.4 g/dL (ref 0.4–1.8)
Globulin, Total: 2.1 g/dL — ABNORMAL LOW (ref 2.2–3.9)
M-Spike, %: 0.1 g/dL — ABNORMAL HIGH
Total Protein ELP: 6 g/dL (ref 6.0–8.5)

## 2019-10-16 ENCOUNTER — Ambulatory Visit: Payer: Medicare Other | Attending: Pain Medicine | Admitting: Pain Medicine

## 2019-10-16 ENCOUNTER — Other Ambulatory Visit: Payer: Self-pay

## 2019-10-16 ENCOUNTER — Encounter: Payer: Self-pay | Admitting: Pain Medicine

## 2019-10-16 VITALS — BP 111/65 | HR 101 | Temp 97.2°F | Resp 18 | Ht 67.0 in | Wt 160.0 lb

## 2019-10-16 DIAGNOSIS — C9001 Multiple myeloma in remission: Secondary | ICD-10-CM | POA: Insufficient documentation

## 2019-10-16 DIAGNOSIS — G8929 Other chronic pain: Secondary | ICD-10-CM | POA: Diagnosis present

## 2019-10-16 DIAGNOSIS — Z451 Encounter for adjustment and management of infusion pump: Secondary | ICD-10-CM | POA: Diagnosis present

## 2019-10-16 DIAGNOSIS — S22080S Wedge compression fracture of T11-T12 vertebra, sequela: Secondary | ICD-10-CM | POA: Diagnosis present

## 2019-10-16 DIAGNOSIS — G894 Chronic pain syndrome: Secondary | ICD-10-CM | POA: Diagnosis present

## 2019-10-16 DIAGNOSIS — Z95828 Presence of other vascular implants and grafts: Secondary | ICD-10-CM | POA: Insufficient documentation

## 2019-10-16 DIAGNOSIS — M5416 Radiculopathy, lumbar region: Secondary | ICD-10-CM | POA: Insufficient documentation

## 2019-10-16 NOTE — Progress Notes (Signed)
Safety precautions to be maintained throughout the outpatient stay will include: orient to surroundings, keep bed in low position, maintain call bell within reach at all times, provide assistance with transfer out of bed and ambulation.  

## 2019-10-16 NOTE — Progress Notes (Addendum)
PROVIDER NOTE: Information contained herein reflects review and annotations entered in association with encounter. Interpretation of such information and data should be left to medically-trained personnel. Information provided to patient can be located elsewhere in the medical record under "Patient Instructions". Document created using STT-dictation technology, any transcriptional errors that may result from process are unintentional.   Patient's Name: Logan Perez  MRN: [id number]  Referring Provider: Sofie Hartigan, MD  DOB: 03-06-1946  PCP: Sofie Hartigan, MD  DOS: 10/16/2019  Note by: Gaspar Cola, MD  Service setting: Ambulatory outpatient  Specialty: Interventional Pain Management  Patient type: Established  Location: [facility] (AMB) Pain Management Facility  Visit type: Interventional Procedure   Primary Reason for Visit: Interventional Pain Management Treatment. CC: Back Pain (low)  Procedure:          Intrathecal Drug Delivery System (IDDS):  Type: Reservoir Refill [phone]) No rate change Region: Abdominal Laterality: Right  Type of Pump: Medtronic Synchromed II Delivery Route: Intrathecal Type of Pain Treated: Neuropathic/Nociceptive Primary Medication Class: Opioid/opiate  Medication, Concentration, Infusion Program, & Delivery Rate: Please see scanned programming printout.   Indications: 1. Chronic pain syndrome   2. Multiple myeloma in remission ([place])   3. Compression fracture of T12 vertebra (HCC) (70-75% magnitude) (with mild retropulsion)   4. Chronic lumbar radicular pain   5. Presence of implanted infusion pump (Medtronic, programmable, intrathecal pump)   6. Encounter for adjustment or management of infusion pump    Pain Assessment: Self-Reported Pain Score: 1 /10             Reported level is compatible with observation.        Pharmacotherapy Assessment  Analgesic: Intrathecal PF-Fentanyl 502.2 mcg/day (20.9 mcg/hr) MME/day: 50.16 mg/day.    Monitoring: Pharmacotherapy: No side-effects or adverse reactions reported. [place] PMP: PDMP reviewed during this encounter.       Compliance: No problems identified. Effectiveness: Clinically acceptable. Plan: Refer to "POC".  UDS: No results found for: SUMMARY Intrathecal Pump Therapy Assessment  Manufacturer: Medtronic Synchromed Type: Programmable Volume: 40 mL reservoir MRI compatibility: Yes   Drug content:  Primary Medication Class:Opioid Primary Medication:PF-Fentanyl(1500 mcg/mL) Secondary Medication:PF-Bupivacaine(30 mg/mL) Other Medication:PF-Clonidine(300 mcg/mL)   Programming:  Type: Simple continuous. See pump readout for details.   Changes:  Medication Change: None at this point Rate Change: No change in rate  Reported side-effects or adverse reactions: None reported  Effectiveness: Described as relatively effective, allowing for increase in activities of daily living (ADL) Clinically meaningful improvement in function (CMIF): Sustained CMIF goals met  Plan: Pump refill today  Pre-op Assessment:  Mr. Logan Perez is a 74 y.o. (year old), male patient, seen today for interventional treatment. He  has a past surgical history that includes Knee arthroscopy (Left, 1992); Pain pump implantation (2012); stem cell implant (2008); Cataract extraction w/PHACO (Right, 05/02/2016); Cataract extraction w/PHACO (Left, 05/16/2016); Limbal stem cell transplant; PORTA CATH INSERTION (N/A, 12/04/2016); Colonoscopy with propofol (N/A, 03/22/2017); Esophagogastroduodenoscopy (egd) with propofol (03/22/2017); Eye surgery; Back surgery (1994); and Pain pump implantation (N/A, 09/04/2017). Mr. Logan Perez has a current medication list which includes the following prescription(s): acetaminophen, baclofen, bismuth subsalicylate, calcium-vitamin d, cetirizine, vitamin d3, daratumumab, dexamethasone, diltiazem, diltiazem, dimenhydrinate, diphenhydramine, eliquis, fluticasone,  carboxymethylcellulose sodium, loperamide, loratadine, lovastatin, montelukast, multiple vitamins-iron, NON FORMULARY, omeprazole, ondansetron, potassium chloride sa, tamsulosin, valacyclovir, vitamin b-12, PAIN MANAGEMENT IT PUMP REFILL, and PAIN MANAGEMENT IT PUMP REFILL. His primarily concern today is the Back Pain (low)  Initial Vital Signs:  Pulse/HCG Rate: (!) 101  Temp: (!) 97.2 F (36.2 C) Resp: 18 BP: 111/65 SpO2: 98 %  BMI: Estimated body mass index is 25.06 kg/m as calculated from the following:   Height as of this encounter: '5\' 7"'$  (1.702 m).   Weight as of this encounter: 160 lb (72.6 kg).  Risk Assessment: Allergies: Reviewed. He is allergic to azithromycin; bee pollen; pollen extract; zoledronic acid; and rivaroxaban.  Allergy Precautions: None required Coagulopathies: Reviewed. None identified.  Blood-thinner therapy: None at this time Active Infection(s): Reviewed. None identified. Mr. Logan Perez is afebrile  Site Confirmation: Mr. Logan Perez was asked to confirm the procedure and laterality before marking the site Procedure checklist: Completed Consent: Before the procedure and under the influence of no sedative(s), amnesic(s), or anxiolytics, the patient was informed of the treatment options, risks and possible complications. To fulfill our ethical and legal obligations, as recommended by the American Medical Association's Code of Ethics, I have informed the patient of my clinical impression; the nature and purpose of the treatment or procedure; the risks, benefits, and possible complications of the intervention; the alternatives, including doing nothing; the risk(s) and benefit(s) of the alternative treatment(s) or procedure(s); and the risk(s) and benefit(s) of doing nothing.  Mr. Logan Perez was provided with information about the general risks and possible complications associated with most interventional procedures. These include, but are not limited to: failure to achieve  desired goals, infection, bleeding, organ or nerve damage, allergic reactions, paralysis, and/or death.  In addition, he was informed of those risks and possible complications associated to this particular procedure, which include, but are not limited to: damage to the implant; failure to decrease pain; local, systemic, or serious CNS infections, intraspinal abscess with possible cord compression and paralysis, or life-threatening such as meningitis; bleeding; organ damage; nerve injury or damage with subsequent sensory, motor, and/or autonomic system dysfunction, resulting in transient or permanent pain, numbness, and/or weakness of one or several areas of the body; allergic reactions, either minor or major life-threatening, such as anaphylactic or anaphylactoid reactions.  Furthermore, Mr. Gacek was informed of those risks and complications associated with the medications. These include, but are not limited to: allergic reactions (i.e.: anaphylactic or anaphylactoid reactions); endorphine suppression; bradycardia and/or hypotension; water retention and/or peripheral vascular relaxation leading to lower extremity edema and possible stasis ulcers; respiratory depression and/or shortness of breath; decreased metabolic rate leading to weight gain; swelling or edema; medication-induced neural toxicity; particulate matter embolism and blood vessel occlusion with resultant organ, and/or nervous system infarction; and/or intrathecal granuloma formation with possible spinal cord compression and permanent paralysis.  Before refilling the pump Mr. Frangos was informed that some of the medications used in the devise may not be FDA approved for such use and therefore it constitutes an off-label use of the medications.  Finally, he was informed that Medicine is not an exact science; therefore, there is also the possibility of unforeseen or unpredictable risks and/or possible complications that may result in a  catastrophic outcome. The patient indicated having understood very clearly. We have given the patient no guarantees and we have made no promises. Enough time was given to the patient to ask questions, all of which were answered to the patient's satisfaction. Mr. Royse has indicated that he wanted to continue with the procedure. Attestation: I, the ordering provider, attest that I have discussed with the patient the benefits, risks, side-effects, alternatives, likelihood of achieving goals, and potential problems during recovery for the procedure that I have provided informed consent. Date  Time: 10/16/2019 11:18  AM  Pre-Procedure Preparation:  Monitoring: As per clinic protocol. Respiration, ETCO2, SpO2, BP, heart rate and rhythm monitor placed and checked for adequate function Safety Precautions: Patient was assessed for positional comfort and pressure points before starting the procedure. Time-out: I initiated and conducted the "Time-out" before starting the procedure, as per protocol. The patient was asked to participate by confirming the accuracy of the "Time Out" information. Verification of the correct person, site, and procedure were performed and confirmed by me, the nursing staff, and the patient. "Time-out" conducted as per Joint Commission's Universal Protocol (UP.01.01.01). Time: 1127  Description of Procedure:          Position: Supine Target Area: Central-port of intrathecal pump. Approach: Anterior, 90 degree angle approach. Area Prepped: Entire Area around the pump implant. Prepping solution: DuraPrep (Iodine Povacrylex [0.7% available iodine] and Isopropyl Alcohol, 74% w/w) Safety Precautions: Aspiration looking for blood return was conducted prior to all injections. At no point did we inject any substances, as a needle was being advanced. No attempts were made at seeking any paresthesias. Safe injection practices and needle disposal techniques used. Medications properly checked for  expiration dates. SDV (single dose vial) medications used. Description of the Procedure: Protocol guidelines were followed. Two nurses trained to do implant refills were present during the entire procedure. The refill medication was checked by both healthcare providers as well as the patient. The patient was included in the "Time-out" to verify the medication. The patient was placed in position. The pump was identified. The area was prepped in the usual manner. The sterile template was positioned over the pump, making sure the side-port location matched that of the pump. Both, the pump and the template were held for stability. The needle provided in the Medtronic Kit was then introduced thru the center of the template and into the central port. The pump content was aspirated and discarded volume documented. The new medication was slowly infused into the pump, thru the filter, making sure to avoid overpressure of the device. The needle was then removed and the area cleansed, making sure to leave some of the prepping solution back to take advantage of its long term bactericidal properties. The pump was interrogated and programmed to reflect the correct medication, volume, and dosage. The program was printed and taken to the physician for approval. Once checked and signed by the physician, a copy was provided to the patient and another scanned into the EMR. Vitals:   10/16/19 1117 10/16/19 1119  BP:  111/65  Pulse: (!) 101   Resp: 18   Temp: (!) 97.2 F (36.2 C)   SpO2: 98%   Weight: 160 lb (72.6 kg)   Height: '5\' 7"'$  (1.702 m)     Start Time: 1127 hrs. End Time: 1138 hrs. Materials & Medications: Medtronic Refill Kit Medication(s): Please see chart orders for details.  Imaging Guidance:          Type of Imaging Technique: None used Indication(s): N/A Exposure Time: No patient exposure Contrast: None used. Fluoroscopic Guidance: N/A Ultrasound Guidance: N/A Interpretation: N/A  Antibiotic  Prophylaxis:   Anti-infectives (From admission, onward)   None     Indication(s): None identified  Post-operative Assessment:  Post-procedure Vital Signs:  Pulse/HCG Rate: (!) 101  Temp: (!) 97.2 F (36.2 C) Resp: 18 BP: 111/65 SpO2: 98 %  EBL: None  Complications: No immediate post-treatment complications observed by team, or reported by patient.  Note: The patient tolerated the entire procedure well. A repeat set of  vitals were taken after the procedure and the patient was kept under observation following institutional policy, for this type of procedure. Post-procedural neurological assessment was performed, showing return to baseline, prior to discharge. The patient was provided with post-procedure discharge instructions, including a section on how to identify potential problems. Should any problems arise concerning this procedure, the patient was given instructions to immediately contact us, at any time, without hesitation. In any case, we plan to contact the patient by telephone for a follow-up status report regarding this interventional procedure.  Comments:  No additional relevant information.  Plan of Care  Orders:  Orders Placed This Encounter  Procedures  . Pump Refill (Today)    Maintain Protocol by having two(2) healthcare providers during procedure and programming.    Scheduling Instructions:     Please refill intrathecal pump today.    Order Specific Question:   Where will this procedure be performed?    Answer:   [facility]  . Pump Refill (Schedule Return)    Whenever possible schedule on a procedure today.    Standing Status:   Future    Standing Expiration Date:   03/14/2020    Scheduling Instructions:     Please schedule intrathecal pump refill based on pump programming. Avoid schedule intervals of more than 120 days (4 months).    Order Specific Question:   Where will this procedure be performed?    Answer:   [facility]  . Consent: Pump  Refill    Provider Attestation: I, [place] Dossie Arbour, MD, (Pain Management Specialist), the physician/practitioner, attest that I have discussed with the patient the benefits, risks, side effects, alternatives, likelihood of achieving goals and potential problems during recovery for the procedure that I have provided informed consent.    Scheduling Instructions:     Procedure: Intrathecal Pump Refill     Attending Physician: Beatriz Chancellor A. Dossie Arbour, MD     Indications: Chronic Pain Syndrome (G89.4)     Transcribe to consent form and obtain patient signature.   Chronic Opioid Analgesic:  Intrathecal PF-Fentanyl 502.2 mcg/day (20.9 mcg/hr) MME/day: 50.16 mg/day.   Medications ordered for procedure: No orders of the defined types were placed in this encounter.  Medications administered: Lexine Baton "Rick" had no medications administered during this visit.  See the medical record for exact dosing, route, and time of administration.  Follow-up plan:   Return for Pump Refill (Max:40mo.       Interventional management options:  Considering:   Palliative intrathecal pump refills  No other procedures currently under consideration   Palliative PRN treatment(s):   Palliative management of intrathecal pump      Recent Visits No visits were found meeting these conditions.  Showing recent visits within past 90 days and meeting all other requirements   Today's Visits Date Type Provider Dept  10/16/19 Procedure visit NMilinda Pointer MD [facility]  Showing today's visits and meeting all other requirements   Future Appointments No visits were found meeting these conditions.  Showing future appointments within next 90 days and meeting all other requirements   Disposition: Discharge home  Discharge Date & Time: 10/16/2019; 1149 hrs.   Primary Care Physician: FSofie Hartigan MD [facility] Note by: FGaspar Cola MD Date:  10/16/2019; Time: 1:06 PM  Disclaimer:  Medicine is not an eChief Strategy Officer The only guarantee in medicine is that nothing is guaranteed. It is important to note that the decision to proceed  with this intervention was based on the information collected from the patient. The Data and conclusions were drawn from the patient's questionnaire, the interview, and the physical examination. Because the information was provided in large part by the patient, it cannot be guaranteed that it has not been purposely or unconsciously manipulated. Every effort has been made to obtain as much relevant data as possible for this evaluation. It is important to note that the conclusions that lead to this procedure are derived in large part from the available data. Always take into account that the treatment will also be dependent on availability of resources and existing treatment guidelines, considered by other Pain Management Practitioners as being common knowledge and practice, at the time of the intervention. For Medico-Legal purposes, it is also important to point out that variation in procedural techniques and pharmacological choices are the acceptable norm. The indications, contraindications, technique, and results of the above procedure should only be interpreted and judged by a Board-Certified Interventional Pain Specialist with extensive familiarity and expertise in the same exact procedure and technique.

## 2019-10-16 NOTE — Patient Instructions (Signed)
Opioid Overdose Opioids are drugs that are often used to treat pain. Opioids include illegal drugs, such as heroin, as well as prescription pain medicines, such as codeine, morphine, hydrocodone, oxycodone, and fentanyl. An opioid overdose happens when you take too much of an opioid. An overdose may be intentional or accidental and can happen with any type of opioid. The effects of an overdose can be mild, dangerous, or even deadly. Opioid overdose is a medical emergency. What are the causes? This condition may be caused by:  Taking too much of an opioid on purpose.  Taking too much of an opioid by accident.  Using two or more substances that contain opioids at the same time.  Taking an opioid with a substance that affects your heart, breathing, or blood pressure. These include alcohol, tranquilizers, sleeping pills, illegal drugs, and some over-the-counter medicines. This condition may also happen due to an error made by:  A health care provider who prescribes a medicine.  The pharmacist who fills the prescription order. What increases the risk? This condition is more likely in:  Children. They may be attracted to colorful pills. Because of a child's small size, even a small amount of a drug can be dangerous.  Older people. They may be taking many different drugs. Older people may have difficulty reading labels or remembering when they last took their medicine. They may also be more sensitive to the effects of opioids.  People with chronic medical conditions, especially heart, liver, kidney, or neurological diseases.  People who take an opioid for a long period of time.  People who use: ? Illegal drugs. IV heroin is especially dangerous. ? Other substances, including alcohol, while using an opioid.  People who have: ? A history of drug or alcohol abuse. ? Certain mental health conditions. ? A history of previous drug overdoses.  People who take opioids that are not prescribed  for them. What are the signs or symptoms? Symptoms of this condition depend on the type of opioid and the amount that was taken. Common symptoms include:  Sleepiness or difficulty waking from sleep.  Decrease in attention.  Confusion.  Slurred speech.  Slowed breathing and a slow pulse (bradycardia).  Nausea and vomiting.  Abnormally small pupils. Signs and symptoms that require emergency treatment include:  Cold, clammy, and pale skin.  Blue lips and fingernails.  Vomiting.  Gurgling sounds in the throat.  A pulse that is very slow or difficult to detect.  Breathing that is very irregular, slow, noisy, or difficult to detect.  Limp body.  Inability to respond to speech or be awakened from sleep (stupor).  Seizures. How is this diagnosed? This condition is diagnosed based on your symptoms and medical history. It is important to tell your health care provider:  About all of the opioids that you took.  When you took the opioids.  Whether you were drinking alcohol or using marijuana, cocaine, or other drugs. Your health care provider will do a physical exam. This exam may include:  Checking and monitoring your heart rate and rhythm, breathing rate, temperature, and blood pressure (vital signs).  Measuring oxygen levels in your blood.  Checking for abnormally small pupils. You may also have blood tests or urine tests. You may have X-rays if you are having severe breathing problems. How is this treated? This condition requires immediate medical treatment and hospitalization. Treatment is given in the hospital intensive care (ICU) setting. Supporting your vital signs and your breathing is the first step in  treating an opioid overdose. Treatment may also include:  Giving salts and minerals (electrolytes) along with fluids through an IV.  Inserting a breathing tube (endotracheal tube) in your airway to help you breathe if you cannot breathe on your own or you are in  danger of not being able to breathe on your own.  Giving oxygen through a small tube under your nose.  Passing a tube through your nose and into your stomach (nasogastric tube, or NG tube) to empty your stomach.  Giving medicines that: ? Increase your blood pressure. ? Relieve nausea and vomiting. ? Relieve abdominal pain and cramping. ? Reverse the effects of the opioid (naloxone).  Monitoring your heart and oxygen levels.  Ongoing counseling and mental health support if you intentionally overdosed or used an illegal drug. Follow these instructions at home:  Medicines  Take over-the-counter and prescription medicines only as told by your health care provider.  Always ask your health care provider about possible side effects and interactions of any new medicine that you start taking.  Keep a list of all the medicines that you take, including over-the-counter medicines. Bring this list with you to all your medical visits. General instructions  Drink enough fluid to keep your urine pale yellow.  Keep all follow-up visits as told by your health care provider. This is important. How is this prevented?  Read the drug inserts that come with your opioid pain medicines.  Take medicines only as told by your health care provider. Do not take more medicine than you are told. Do not take medicines more frequently than you are told.  Do not drink alcohol or take sedatives when taking opioids.  Do not use illegal or recreational drugs, including cocaine, ecstasy, and marijuana.  Do not take opioid medicines that are not prescribed for you.  Store all medicines in safety containers that are out of the reach of children.  Get help if you are struggling with: ? Alcohol or drug use. ? Depression or another mental health problem. ? Thoughts of hurting yourself or another person.  Keep the phone number of your local poison control center near your phone or in your mobile phone. In the  [place] the hotline of the National Poison Control Center is (800) 222-1222.  If you were prescribed naloxone, make sure you understand how to take it. Contact a health care provider if you:  Need help understanding how to take your pain medicines.  Feel your medicines are too strong.  Are concerned that your pain medicines are not working well for your pain.  Develop new symptoms or side effects when you are taking medicines. Get help right away if:  You or someone else is having symptoms of an opioid overdose. Get help even if you are not sure.  You have serious thoughts about hurting yourself or others.  You have: ? Chest pain. ? Difficulty breathing. ? A loss of consciousness. These symptoms may represent a serious problem that is an emergency. Do not wait to see if the symptoms will go away. Get medical help right away. Call your local emergency services (911 in the [place] Do not drive yourself to the hospital. If you ever feel like you may hurt yourself or others, or have thoughts about taking your own life, get help right away. You can go to your nearest emergency department or call:  Your local emergency services (911 in the [place]  A suicide crisis helpline, such as the National Suicide Prevention Lifeline at   1-800-273-8255. This is open 24 hours a day. Summary  Opioids are drugs that are often used to treat pain. Opioids include illegal drugs, such as heroin, as well as prescription pain medicines.  An opioid overdose happens when you take too much of an opioid.  Overdoses can be intentional or accidental.  Opioid overdose is very dangerous. It is a life-threatening emergency.  If you or someone you know is experiencing an opioid overdose, get help right away. This information is not intended to replace advice given to you by your health care provider. Make sure you discuss any questions you have with your health care provider. Document Revised: 09/12/2018 Document  Reviewed: 09/12/2018 [employer] Patient Education  2020 [employer]  

## 2019-10-17 ENCOUNTER — Telehealth: Payer: Self-pay

## 2019-10-17 NOTE — Telephone Encounter (Signed)
Post procedure phone call. Patient states he is doing well.  

## 2019-10-24 MED FILL — Medication: INTRATHECAL | Qty: 1 | Status: AC

## 2019-11-04 ENCOUNTER — Ambulatory Visit
Admission: RE | Admit: 2019-11-04 | Discharge: 2019-11-04 | Disposition: A | Discharge status: Home / Self Care | Payer: Medicare Other | Source: Ambulatory Visit | Attending: Hematology and Oncology | Admitting: Hematology and Oncology

## 2019-11-04 ENCOUNTER — Other Ambulatory Visit: Payer: Self-pay

## 2019-11-04 DIAGNOSIS — C9000 Multiple myeloma not having achieved remission: Secondary | ICD-10-CM | POA: Diagnosis not present

## 2019-11-05 ENCOUNTER — Encounter: Payer: Self-pay | Admitting: Gastroenterology

## 2019-11-05 ENCOUNTER — Ambulatory Visit (INDEPENDENT_AMBULATORY_CARE_PROVIDER_SITE_OTHER): Payer: Medicare Other | Admitting: Gastroenterology

## 2019-11-05 VITALS — BP 116/71 | HR 111 | Temp 97.6°F | Ht 67.0 in | Wt 166.0 lb

## 2019-11-05 DIAGNOSIS — K529 Noninfective gastroenteritis and colitis, unspecified: Secondary | ICD-10-CM | POA: Diagnosis not present

## 2019-11-05 NOTE — Progress Notes (Signed)
Primary Care Physician: Sofie Hartigan, MD  Primary Gastroenterologist:  Dr. Lucilla Lame  Chief Complaint  Patient presents with  . Follow-up    HPI: Logan Perez is a 74 y.o. male here for follow-up of diarrhea.  The patient has an extensive diary of his bowel movements and he usually averages 1-3 bowel movements a day.  He states that it started after his bone marrow transplant for multiple myeloma.  He did not have any symptoms prior to that.  He also reports that he cannot tell if any foods make it better or worse.  There is no report of any black stools or bloody stools.  He also denies any abdominal pain with the diarrhea.  The patient treats his diarrhea with Imodium which usually helps his symptoms.  Current Outpatient Medications  Medication Sig Dispense Refill  . acetaminophen (TYLENOL) 500 MG tablet Take 500 mg by mouth 2 (two) times a day.    . baclofen (LIORESAL) 10 MG tablet Take 1 tablet (10 mg total) by mouth 2 (two) times daily. 180 tablet 3  . bismuth subsalicylate (PEPTO BISMOL) 262 MG/15ML suspension Take 30 mLs by mouth every 6 (six) hours as needed for diarrhea or loose stools.    . Calcium Carb-Cholecalciferol (CALCIUM-VITAMIN D) 500-400 MG-UNIT TABS Take 1 tablet daily by mouth.    . cetirizine (ZYRTEC) 10 MG tablet Take 10 mg by mouth daily as needed for allergies.     . Cholecalciferol (VITAMIN D3) 2000 units capsule Take 2,000 Units daily by mouth.     . Daratumumab (DARZALEX IV) Inject 1,200 mg every 30 (thirty) days into the vein.     [name] dexamethasone (DECADRON) 4 MG tablet TAKE 4 TABLETS BY MOUTH 1 HOUR PRIOR TO INFUSION, THEN 1 TABLET ON DAYS 2 AND 3 AS DIRECTED 30 tablet 0  . diltiazem (CARDIZEM CD) 120 MG 24 hr capsule Take 120 mg daily by mouth.     . diltiazem (CARDIZEM) 60 MG tablet Take 60 mg daily as needed by mouth (for increased heart rate > 140).     [name] dimenhyDRINATE (DRAMAMINE) 50 MG tablet Take 50 mg by mouth every 8 (eight) hours  as needed.    . diphenhydrAMINE (BENADRYL) 25 MG tablet Take 25 mg by mouth as directed. With chemo treatment    . ELIQUIS 5 MG TABS tablet TAKE 1 TABLET BY MOUTH TWICE DAILY 60 tablet 0  . fluticasone (FLONASE) 50 MCG/ACT nasal spray Place 1 spray into the nose daily as needed for allergies.     . Hypromellose (ARTIFICIAL TEARS OP) Place 1 drop as needed into both eyes (for dry eyes).    [name] loperamide (IMODIUM) 2 MG capsule Take 4 mg as needed by mouth for diarrhea or loose stools.     [name] loratadine (CLARITIN) 10 MG tablet Take 10 mg by mouth daily as needed.     . lovastatin (MEVACOR) 20 MG tablet Take 20 mg by mouth every evening.     . montelukast (SINGULAIR) 10 MG tablet TAKE 1 TABLET BY MOUTH DAILY THE DAY BEFORE THE INFUSION, THEN DAY OF, AND THE 2 DAYS AFTER INFUSION 30 tablet 0  . Multiple Vitamins-Iron (MULTIVITAMIN/IRON PO) Take 1 tablet daily by mouth.    . NON FORMULARY IT pump  Fentanyl 1,500.0 mcg/ml Bupivicaine 30.0 mg/ml Clonidine 300.0 mcg/ml Rate 502.2 mcg/day    . omeprazole (PRILOSEC) 20 MG capsule Take 20 mg by mouth daily.     . ondansetron (ZOFRAN)  8 MG tablet Take 1 tablet (8 mg total) by mouth as directed. Take with chemo and can take it as needed 20 tablet 1  . potassium chloride SA (KLOR-CON) 20 MEQ tablet Take 1 tablet (20 mEq total) by mouth daily. 90 tablet 0  . tamsulosin (FLOMAX) 0.4 MG CAPS capsule TAKE 1 CAPSULE(0.4 MG) BY MOUTH DAILY 90 capsule 4  . valACYclovir (VALTREX) 500 MG tablet TAKE 1 TABLET BY MOUTH EVERY DAY 30 tablet 3  . vitamin B-12 (CYANOCOBALAMIN) 1000 MCG tablet Take 1,000 mcg daily by mouth.     [name] PAIN MANAGEMENT IT PUMP REFILL 1 each by Intrathecal route once for 1 dose. Medication: PF Fentanyl 1,500.0 mcg/ml PF Bupivicaine 30.0 mg/ml PF Clonidine 300.mcg/ml Total Volume: 1m Needed by 07-02-19 @ 1000 1 each 0  . PAIN MANAGEMENT IT PUMP REFILL 1 each by Intrathecal route once for 1 dose. Medication: PF Fentanyl 1,500.0 mcg/ml PF  Bupivicaine 30.0 mg/ml PF Clonidine 300.0 mcg/ml Total Volume: 40 ml Needed by 10-16-2019 @ 1000 1 each 0   No current facility-administered medications for this visit.    Allergies as of 11/05/2019 - Review Complete 11/05/2019  Allergen Reaction Noted  . Azithromycin Diarrhea and Other (See Comments) 11/10/2015  . Bee pollen Other (See Comments) 02/05/2017  . Pollen extract Other (See Comments) 02/05/2017  . Zoledronic acid Other (See Comments) 04/26/2015  . Rivaroxaban Rash 04/26/2015    ROS:  General: Negative for anorexia, weight loss, fever, chills, fatigue, weakness. ENT: Negative for hoarseness, difficulty swallowing , nasal congestion. CV: Negative for chest pain, angina, palpitations, dyspnea on exertion, peripheral edema.  Respiratory: Negative for dyspnea at rest, dyspnea on exertion, cough, sputum, wheezing.  GI: See history of present illness. GU:  Negative for dysuria, hematuria, urinary incontinence, urinary frequency, nocturnal urination.  Endo: Negative for unusual weight change.    Physical Examination:   BP 116/71   Pulse (!) 111   Temp 97.6 F (36.4 C) (Oral)   Ht '5\' 7"'$  (1.702 m)   Wt 166 lb (75.3 kg)   BMI 26.00 kg/m   General: Well-nourished, well-developed in no acute distress.  Eyes: No icterus. Conjunctivae pink. Neuro: Alert and oriented x 3.  Grossly intact. Psych: Alert and cooperative, normal mood and affect.  Labs:    Imaging Studies: DG Bone Survey Met  Result Date: 11/04/2019 CLINICAL DATA:  Multiple myeloma; annual surveillance. EXAM: METASTATIC BONE SURVEY COMPARISON:  PET-CT 08/14/2018.  Bone survey 10/21/2015. FINDINGS: Standard imaging of the axial and appendicular skeleton performed. 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 No other lytic lesions are identified. PowerPort catheter noted with tip at cavoatrial junction. Stable thoracic compression fractures. Aortoiliac and peripheral vascular disease. 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 No other lytic lesions are identified. Exam appears stable from prior exams. Electronically Signed   By: TMarcello Moores Register   On: 11/04/2019 17:02    Assessment and Plan:   Logan Emondis a 74y.o. y/o male who comes in today with a history of diarrhea.  The patient had a colonoscopy with biopsies throughout the colon and terminal ileum.  The biopsies of the ileum showed some active ileitis without any sign of chronicity and the colon showed some chronic changes without any acute colitis.  It was thought that this may be due to NSAIDs, drug injury versus inflammatory bowel disease.  Since the patient's symptoms are not consistent with inflammatory bowel disease without bloody diarrhea and had variable  rate of the symptoms IBD is less likely.     Lucilla Lame, MD. Marval Regal    Note: This dictation was prepared with Dragon dictation along with smaller phrase technology. Any transcriptional errors that result from this process are unintentional.

## 2019-11-10 ENCOUNTER — Other Ambulatory Visit: Payer: Self-pay

## 2019-11-10 DIAGNOSIS — C9001 Multiple myeloma in remission: Secondary | ICD-10-CM

## 2019-11-10 MED ORDER — APIXABAN 5 MG PO TABS: 5.0000 mg | ORAL_TABLET | Freq: Two times a day (BID) | ORAL | 0 refills | Status: DC

## 2019-11-11 NOTE — Progress Notes (Signed)
[facility]  [address], [address] [place], [place] [address] Phone: [phone]  Fax: [phone]   Clinic Day:  11/13/2019  Referring physician: Sofie Hartigan, MD  Chief Complaint: Logan Perez is a 74 y.o. 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   HPI: The patient was last seen in the medical oncology clinic on 10/13/2019. At that time, he continued to struggle with diarrhea (chonic since tranplant). Exam was stable. Hematocrit 36.6, hemoglobin 12.6, MCV 101.1, platelets 202,000, WBC 7,500. Creatinine was 1.44. Calcium was 8.7. Magnesium was 1.8. M spike was 0.1 gm/dL. He completed cycle #36 daratumumab. We discussed a follow up with Dr. Allen Norris secondary to diarrhea. He continued on Eliquis.   Bone survey on 11/04/2019 revealed stable small lytic lesions noted throughout the skull.  There were no other lytic lesions were identified. Exam was stable.   He had a follow up with Dr. Allen Norris on 11/05/2019. The patient had an extensive diary of his bowel movements and he usually averaged 1-3 bowel movements per day.  He stated that it started after his bone marrow transplant for multiple myeloma. He could not tell if any foods made it better or worse. He denied any bloody or black stool. He had no associated abdominal pain with diarrhea.   Dr. Allen Norris considered that it may be due to NSAIDs, drug injury versus inflammatory bowel disease. The patient's symptoms were not consistent with inflammatory bowel disease without bloody diarrhea and had variable rate of the symptoms IBD was less likely.  During the interim, he has been doing well. He has ongoing diarrhea. He is keeping a log of his bowel movements. He is talking up 6 Imodium daily. He has 2 bowel movements a day. He notes the steroids are helping keep his stool solid.  He has increased indigestion. His back pain  is better with Tylenol. He notes drinking quinine water and Pedialyte. He notes restless legs. He reports taking his pre medications this morning.    Past Medical History:  Diagnosis Date  . Anxiety   . Atrial fibrillation ([place])   . BPH (benign prostatic hyperplasia)   . Complication of anesthesia    BAD HEADACHE NIGHT OF FIRST CATARACT  . Compression fracture of lumbar vertebra ([place])   . Difficulty voiding   . Dysrhythmia    A FIB  . Elevated PSA   . GERD (gastroesophageal reflux disease)   . Hearing aid worn    bilateral  . History of kidney stones   . HLD (hyperlipidemia)   . HOH (hard of hearing)   . Hypertension   . Multiple myeloma ([place])   . Neuropathy    feet. 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  . Pain    BACK  . Palpitations   . Pneumonia   . Pulmonary embolism ([place])   . Sepsis ([place])   . Stroke [facility])    TIA, detected on CT scan. pt was unaware    Past Surgical History:  Procedure Laterality Date  . BACK SURGERY  1994  . CATARACT EXTRACTION W/PHACO Right 05/02/2016   Procedure: CATARACT EXTRACTION PHACO AND INTRAOCULAR LENS PLACEMENT (IOC);  Surgeon: Birder Robson, MD;  Location: [facility];  Service: Ophthalmology;  Laterality: Right;  [place] 1.06 AP% 20.6 CDE 13.70 FLUID PACK LOT # [id number]  . CATARACT EXTRACTION W/PHACO Left 05/16/2016   Procedure: CATARACT EXTRACTION PHACO AND INTRAOCULAR LENS PLACEMENT (IOC);  Surgeon:  Birder Robson, MD;  Location: [facility];  Service: Ophthalmology;  Laterality: Left;  [place] 01:43 AP% 19.8 CDE 20.45 FLUID PACK LOT #[id number] H  . COLONOSCOPY WITH PROPOFOL N/A 03/22/2017   Procedure: COLONOSCOPY WITH PROPOFOL;  Surgeon: Lucilla Lame, MD;  Location: [place];  Service: Endoscopy;  Laterality: N/A;  has port  . ESOPHAGOGASTRODUODENOSCOPY (EGD) WITH PROPOFOL  03/22/2017   Procedure: ESOPHAGOGASTRODUODENOSCOPY (EGD) WITH PROPOFOL;  Surgeon: Lucilla Lame, MD;  Location: [place];  Service: Endoscopy;;  . EYE SURGERY    . KNEE  ARTHROSCOPY Left 1992  . LIMBAL STEM CELL TRANSPLANT    . PAIN PUMP IMPLANTATION  2012  . PAIN PUMP IMPLANTATION N/A 09/04/2017   Procedure: INTRATHECAL PUMP BATTERY CHANGE;  Surgeon: Milinda Pointer, MD;  Location: [facility];  Service: Neurosurgery;  Laterality: N/A;  . PORTA CATH INSERTION N/A 12/04/2016   Procedure: Glori Luis Cath Insertion;  Surgeon: Algernon Huxley, MD;  Location: [place] [facility];  Service: Cardiovascular;  Laterality: N/A;  . stem cell implant  2008   [facility]    Family History  Problem Relation Age of Onset  . Cancer Father        throat  . Kidney disease Sister   . Stroke Other   . Stroke Mother   . Bladder Cancer Neg Hx   . Prostate cancer Neg Hx   . Kidney cancer Neg Hx     Social History:  reports that he quit smoking about 44 years ago. His smoking use included cigarettes. He has a 30.00 pack-year smoking history. He has never used smokeless tobacco. He reports that he does not drink alcohol or use drugs.  He has a wire haired dachshund. He lives in [place]. His wife's name is Logan Perez.  The patient is alone today.  Allergies:  Allergies  Allergen Reactions  . Azithromycin Diarrhea and Other (See Comments)    Possible cause of C. Diff Possible cause of C. Diff Possible cause of C. Diff Possible cause of C. Diff  . Bee Pollen Other (See Comments)    Sneezing, watery eyes, runny nose  . Pollen Extract Other (See Comments)    Sneezing, watery eyes, runny nose  . 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 Rivaroxaban Rash    Current Medications: Current Outpatient Medications  Medication Sig Dispense Refill  . acetaminophen (TYLENOL) 500 MG tablet Take 500 mg by mouth 2 (two) times a day.    [name] apixaban (ELIQUIS) 5 MG TABS tablet Take 1 tablet (5 mg total) by mouth 2 (two) times daily. 60 tablet 0  . baclofen (LIORESAL) 10 MG tablet Take 1 tablet (10 mg total) by mouth 2 (two) times daily. 180  tablet 3  . bismuth subsalicylate (PEPTO BISMOL) 262 MG/15ML suspension Take 30 mLs by mouth every 6 (six) hours as needed for diarrhea or loose stools.    . Calcium Carb-Cholecalciferol (CALCIUM-VITAMIN D) 500-400 MG-UNIT TABS Take 1 tablet daily by mouth.    . cetirizine (ZYRTEC) 10 MG tablet Take 10 mg by mouth daily as needed for allergies.     . Cholecalciferol (VITAMIN D3) 2000 units capsule Take 2,000 Units daily by mouth.     . Daratumumab (DARZALEX IV) Inject 1,200 mg every 30 (thirty) days into the vein.     [name] dexamethasone (DECADRON) 4 MG tablet TAKE 4 TABLETS BY MOUTH 1 HOUR PRIOR TO INFUSION, THEN 1 TABLET ON DAYS 2  AND 3 AS DIRECTED 30 tablet 0  . diltiazem (CARDIZEM CD) 120 MG 24 hr capsule Take 120 mg daily by mouth.     . diltiazem (CARDIZEM) 60 MG tablet Take 60 mg daily as needed by mouth (for increased heart rate > 140).     [name] diphenhydrAMINE (BENADRYL) 25 MG tablet Take 25 mg by mouth as directed. With chemo treatment    . fluticasone (FLONASE) 50 MCG/ACT nasal spray Place 1 spray into the nose daily as needed for allergies.     . Hypromellose (ARTIFICIAL TEARS OP) Place 1 drop as needed into both eyes (for dry eyes).    [name] loperamide (IMODIUM) 2 MG capsule Take 4 mg as needed by mouth for diarrhea or loose stools.     [name] loratadine (CLARITIN) 10 MG tablet Take 10 mg by mouth daily as needed.     . lovastatin (MEVACOR) 20 MG tablet Take 20 mg by mouth every evening.     . montelukast (SINGULAIR) 10 MG tablet TAKE 1 TABLET BY MOUTH DAILY THE DAY BEFORE THE INFUSION, THEN DAY OF, AND THE 2 DAYS AFTER INFUSION 30 tablet 0  . Multiple Vitamins-Iron (MULTIVITAMIN/IRON PO) Take 1 tablet daily by mouth.    . NON FORMULARY IT pump  Fentanyl 1,500.0 mcg/ml Bupivicaine 30.0 mg/ml Clonidine 300.0 mcg/ml Rate 502.2 mcg/day    . omeprazole (PRILOSEC) 20 MG capsule Take 20 mg by mouth daily.     . ondansetron (ZOFRAN) 8 MG tablet Take 1 tablet (8 mg total) by mouth as directed. Take with  chemo and can take it as needed 20 tablet 1  . PAIN MANAGEMENT IT PUMP REFILL 1 each by Intrathecal route once for 1 dose. Medication: PF Fentanyl 1,500.0 mcg/ml PF Bupivicaine 30.0 mg/ml PF Clonidine 300.mcg/ml Total Volume: 23m Needed by 07-02-19 @ 1000 1 each 0  . PAIN MANAGEMENT IT PUMP REFILL 1 each by Intrathecal route once for 1 dose. Medication: PF Fentanyl 1,500.0 mcg/ml PF Bupivicaine 30.0 mg/ml PF Clonidine 300.0 mcg/ml Total Volume: 40 ml Needed by 10-16-2019 @ 1000 1 each 0  . potassium chloride SA (KLOR-CON) 20 MEQ tablet Take 1 tablet (20 mEq total) by mouth daily. 90 tablet 0  . tamsulosin (FLOMAX) 0.4 MG CAPS capsule TAKE 1 CAPSULE(0.4 MG) BY MOUTH DAILY 90 capsule 4  . valACYclovir (VALTREX) 500 MG tablet TAKE 1 TABLET BY MOUTH EVERY DAY 30 tablet 3  . vitamin B-12 (CYANOCOBALAMIN) 1000 MCG tablet Take 1,000 mcg daily by mouth.      No current facility-administered medications for this visit.   Facility-Administered Medications Ordered in Other Visits  Medication Dose Route Frequency Provider Last Rate Last Admin  . heparin lock flush 100 unit/mL  500 Units Intravenous Once Tekila Caillouet C, MD      . sodium chloride flush (NS) 0.9 % injection 10 mL  10 mL Intravenous PRN CLequita Asal MD   10 mL at 11/13/19 01610   Review of Systems  Constitutional: Negative.  Negative for chills, diaphoresis, fever, malaise/fatigue and weight loss (up 2 pounds).       Feels good.  HENT: Positive for hearing loss (hearing aid). Negative for congestion, ear pain, nosebleeds, sinus pain, sore throat and tinnitus.   Eyes: Negative.  Negative for blurred vision, double vision, photophobia and pain.  Respiratory: Negative.  Negative for cough, hemoptysis, sputum production, shortness of breath and wheezing.   Cardiovascular: Negative for chest pain, palpitations, orthopnea, leg swelling and PND.  H/o atrial fibrillation.  Gastrointestinal: Positive for diarrhea (2 loose  stools/day- 6 Imodium a day). Negative for abdominal pain, blood in stool, constipation, heartburn, melena, nausea (using dramamine) and vomiting.       Indigestion.  Genitourinary: Negative.  Negative for dysuria, frequency, hematuria and urgency.  Musculoskeletal: Negative for back pain (chronic; Fentanyl/bupivacaine/clonidine pump; better with tylenol), falls, joint pain, myalgias and neck pain.  Skin: Negative.  Negative for itching and rash.  Neurological: Positive for sensory change (restless legs). Negative for dizziness, tremors, speech change, focal weakness, seizures, weakness and headaches.  Endo/Heme/Allergies: Negative.  Negative for environmental allergies. Does not bruise/bleed easily.  Psychiatric/Behavioral: Negative.  Negative for depression and memory loss. The patient is not nervous/anxious and does not have insomnia.   All other systems reviewed and are negative.  Performance status (ECOG): 1  Vitals Blood pressure 103/66, pulse 81, temperature (!) 96.7 F (35.9 C), temperature source Tympanic, resp. rate 18, height _0  (1.702 m), weight 164 lb 9.2 oz (74.6 kg), SpO2 99 %.   Physical Exam  Constitutional: He is oriented to person, place, and time. He appears well-developed and well-nourished. No distress.  HENT:  Head: Normocephalic and atraumatic.  Mouth/Throat: Oropharynx is clear and moist. No oropharyngeal exudate.  Short brown hair with graying. Goatee.  Mask.   Eyes: Pupils are equal, round, and reactive to light. Conjunctivae and EOM are normal. No scleral icterus.  Oval glasses. Blue eyes.  Neck: No JVD present.  Cardiovascular: Normal rate, regular rhythm and normal heart sounds. Exam reveals no gallop.  No murmur heard. Pulmonary/Chest: Effort normal and breath sounds normal. No respiratory distress. He has no wheezes. He has no rales.  Abdominal: Soft. Bowel sounds are normal. He exhibits no distension and no mass. There is no abdominal tenderness. There  is no rebound and no guarding.  Right sided implanted Fentanyl/bupivacaine/clonidine pump.   Musculoskeletal:        General: No tenderness or edema. Normal range of motion.     Cervical back: Normal range of motion and neck supple.  Lymphadenopathy:       Head (right side): No preauricular, no posterior auricular and no occipital adenopathy present.       Head (left side): No preauricular, no posterior auricular and no occipital adenopathy present.    He has no cervical adenopathy.    He has no axillary adenopathy.       Right: No inguinal and no supraclavicular adenopathy present.       Left: No inguinal and no supraclavicular adenopathy present.  Neurological: He is alert and oriented to person, place, and time.  Skin: Skin is warm and dry. No rash noted. He is not diaphoretic. No erythema. No pallor.  Psychiatric: He has a normal mood and affect. His behavior is normal. Judgment and thought content normal.  Nursing note and vitals reviewed.   Infusion on 11/13/2019  Component Date Value Ref Range Status  . Magnesium 11/13/2019 2.0  1.7 - 2.4 mg/dL Final   Performed at [facility], [address], [place], [place] [address]  . Sodium 11/13/2019 134* 135 - 145 mmol/L Final  . Potassium 11/13/2019 4.4  3.5 - 5.1 mmol/L Final  . Chloride 11/13/2019 103  98 - 111 mmol/L Final  . CO2 11/13/2019 22  22 - 32 mmol/L Final  . Glucose, Bld 11/13/2019 153* 70 - 99 mg/dL Final  . BUN 11/13/2019 25* 8 - 23 mg/dL Final  . Creatinine, Ser 11/13/2019 1.47*  0.61 - 1.24 mg/dL Final  . Calcium 11/13/2019 8.9  8.9 - 10.3 mg/dL Final  . Total Protein 11/13/2019 6.4* 6.5 - 8.1 g/dL Final  . Albumin 11/13/2019 4.0  3.5 - 5.0 g/dL Final  . AST 11/13/2019 21  15 - 41 U/L Final  . ALT 11/13/2019 16  0 - 44 U/L Final  . Alkaline Phosphatase 11/13/2019 43  38 - 126 U/L Final  . Total Bilirubin 11/13/2019 0.7  0.3 - 1.2 mg/dL Final  . GFR calc non Af Amer 11/13/2019 47* >60 mL/min Final  .  GFR calc Af Amer 11/13/2019 54* >60 mL/min Final  . Anion gap 11/13/2019 9  5 - 15 Final   Performed at [facility], [address] [place], [place] [address]  . WBC 11/13/2019 7.8  4.0 - 10.5 K/uL Final  . RBC 11/13/2019 3.66* 4.22 - 5.81 MIL/uL Final  . Hemoglobin 11/13/2019 12.7* 13.0 - 17.0 g/dL Final  . HCT 11/13/2019 37.2* 39.0 - 52.0 % Final  . MCV 11/13/2019 101.6* 80.0 - 100.0 fL Final  . MCH 11/13/2019 34.7* 26.0 - 34.0 pg Final  . MCHC 11/13/2019 34.1  30.0 - 36.0 g/dL Final  . RDW 11/13/2019 13.3  11.5 - 15.5 % Final  . Platelets 11/13/2019 190  150 - 400 K/uL Final  . nRBC 11/13/2019 0.0  0.0 - 0.2 % Final  . Neutrophils Relative % 11/13/2019 71  % Final  . Neutro Abs 11/13/2019 5.5  1.7 - 7.7 K/uL Final  . Lymphocytes Relative 11/13/2019 21  % Final  . Lymphs Abs 11/13/2019 1.6  0.7 - 4.0 K/uL Final  . Monocytes Relative 11/13/2019 6  % Final  . Monocytes Absolute 11/13/2019 0.5  0.1 - 1.0 K/uL Final  . Eosinophils Relative 11/13/2019 1  % Final  . Eosinophils Absolute 11/13/2019 0.1  0.0 - 0.5 K/uL Final  . Basophils Relative 11/13/2019 1  % Final  . Basophils Absolute 11/13/2019 0.0  0.0 - 0.1 K/uL Final  . Immature Granulocytes 11/13/2019 0  % Final  . Abs Immature Granulocytes 11/13/2019 0.03  0.00 - 0.07 K/uL Final   Performed at [facility], [address] [place], [place] [address]    Assessment:  Logan Perez is a 74 y.o. male with stage III mutiple myeloma. He initially presented with progressive back pain beginning in 12/2006. MRI revealed "spots and compression fractures". He began Velcade, thalidomide, and Decadron. In 08/2007, he underwent high dose chemotherapy and autologous stem cell transplant. 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  He recurred with a rising M-spike (2.7) with repeat M spike (1.7 gm/dl) in 03/2010. He was initially treated with Velcade (02/08/2010 - 05/10/2010). He  then began Revlimid (15 mg 3 weeks on/1 week off) and Decadron (40 mg on day 1, 8, 15, 22). Because of significant side effect with Decadron his dose was decreased to 10 mg once a week in 07/2010.   He was on maintenance Revlimid. Revlimid was initially10 mg 3 weeks on/1 week off. This was changed to 10 mg 2 weeks on/2 weeks off secondary to right nipple tenderness. His dose was increased to 10 mg 3 weeks on/1 week off with Decadron 10 mg a week (on Sundays) and then Revlamid 15 mg 3 weeks on and 1 week off with Decadron on Sundays. He began Pomalyst4 mg 3 weeks on/1 week off with Decadron on 08/27/2015.  SPEPrevealed no monoclonal protein (04/21/2015) and 0.5 gm/dL on  09/22/2015 and 10/20/2015. M spikewas 0.1 on 02/02/2016, 03/01/2016, 03/29/2016, 04/26/2016, and 05/24/2016. M spikewas 0 on 10/11/2016, 11/28/2016, 02/05/2017, 03/21/2017, 08/02/2017, 10/04/2017, 12/27/2017, 01/24/2018, and 02/21/2018. M spikewas 0.1 on 11/01/2017, 0.1 on 11/29/2017, 0 on 12/27/2017, 0 on 01/24/2018, 0 on 02/21/2018, 0.1 on 03/21/2018, 0.3 on 04/18/2018, 0.1 on 05/16/2018, 0 on 06/13/2018, 0.2 on 08/08/2018, 0 on 09/09/2018, 0.1 on 11/04/2018, 0 on 12/02/2018, 0.1 on 12/30/2018, 0 on 01/27/2019, 0.1 on 02/24/2019, 0.1 on 03/24/2019, 0.1 on 04/23/2019, 0 on 05/19/2019, 0.1 on 06/17/2019, 0.2 on 07/15/2019, 0.2 on 08/18/2019, 0.2 on 09/15/2019, 0.1 on 10/13/2019, and 0.1 on 11/13/2019.  Free light chainshave been monitored. Kappa free light chainswere 18.54 on 11/21/2013, 18.37 on 02/20/2014, 18.93 on 05/22/2014, 32.58 (high; normal ratio 1.27) on 08/21/2014, 51.53 (high; elevated ratio of 2.12) on 11/13/2014, 28.08 (ratio 1.73) on 12/09/2014, 23.71 (ratio 2.17) on 01/18/2015, 92.93 (ratio 9.49) on 04/21/2015, 93.44 (ratio 10.28) on 05/26/2015, 255.45 (ratio of 24.05) on 07/14/2015, 373.89 (ratio 48.31) on 08/04/2015, 474.33 (ratio 70.58) on 08/25/2015, 450.76 (ratio 55.44) on 09/22/2015, 453.4 (ratio 59.89)  on 10/20/2015, 58.26 (ratio of >40.74) on 02/02/2016, 62.67 (ratio of 37.98) on 03/01/2016, 75.7 (ratio >50.47) on 03/29/2016, 79.7 (ratio 53.13) on 04/26/2016, 108.6 (ratio >72.4) on 05/24/2016, 8.3 (1.46 ratio) on 10/11/2016, 6.7 (0.81 ratio) on 02/05/2017, 7.8 (1.01 ratio) on 03/21/2017,7.6 (ratio 0.63) on 06/01/2017, 8.1 (ratio 1.13) on 11/29/2017, 9.6 (ratio 1.55) on 06/13/2018, 6.6 (ratio 2.06) on 07/11/2018, 6.9 (ratio 0.82) on 08/08/2018, 5.8 (ratio 1.66) on 09/09/2018, 5.8 (ratio 1.05) on 11/04/2018, 5.6(ratio 2.55)on 12/02/2018, 5.5 (ratio 1.96) on 01/27/2019, 7.6 (ratio 2.38) on 06/17/2019, and 6.9 (ratio 1.21) on 11/13/2019.  Bone surveyon 12/08/2014 was stable. Bone survey on 10/21/2015 revealed increase conspicuity of subcentimeter lytic lesions in the calvarium.  Bone marrow aspirate and biopsyon 11/04/2015 revealed an atypical monoclonal plasma cells estimated at 30-40% of marrow cells. Marrow was variably cellular (approximately 45%) with background trilineage hematopoiesis. There was no significant increase in marrow reticulin fibers. Storage iron was present.   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 He develoed herpes zoster in 04/2008. He developed a pulmonary embolismin 05/2013. He was initially on Xarelto, but is now on Eliquis. He had an episode of pneumonia around this time requiring a brief admission. He developed severe lower leg cramps on 08/07/2014 secondary to hypokalemia. Duplex was negative.   He was treated forC difficile colitis(Flagyl completed 07/30/2015). He has a chronic indwelling pain pump.  He received 4 cycles of Pomalyst and Decadron(08/27/2015 - 11/19/2015). Restaging studies document progressive disease. Kappa free light chains are increasing. SPEP revealed 0.5 gm/dL monoclonal protein then 1.3 gm/dL. Bone survey reveals increase conspicuity of subcentimeter lytic lesions in  the calvarium. Bone marrow reveals 30-40% plasma cells.   MUGAon 11/30/2015 revealed an ejection fraction of 46%. He is felt not to be a good candidate for Kyprolis. There were no focal wall motion abnormalities. He had a stress echo less than 1 year ago. He has a history of PVCs and atrial fibrillation. He takes Cardizem prn.  He received 17 weeks of daratumumab(Darzalex) (12/09/2015 - 05/25/2016). He tolerated treatment well without side effect.  Bone marrow on 02/09/2016 revealed no diagnostic morphologic evidence of plasma cell myeloma. Marrow was normocellular to hypocellular marrow for age (ranging from 10-40%) with maturing trilineage hematopoiesis and mild multilineage dyspoiesis. There was patchy mild increase in reticulin. Storage iron was present. Flow cytometry revealed no definitive evidence of monoclonality. There was a non-specific atypical  myeloid and monocytic findings with no increase in blasts. Cytogenetics were normal (46, XY).  He is currently56month s/p 2nd autologous stem cell transplanton 06/16/2016. 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 He also experienced atrial fibrillation with intermittent episodes of RVR requiring IV beta blockers. He is on prophylactic valacyclovir for 1 year post transplant.  He started vaccinations(DTaP-Pediatric triple vaccine, Hep B- Pediatrix triple vaccine, Haemophilus influenza B (Hib), inactivated polio virus (IPV), pneumococcal conjugate vaccine 13-valent (PCV 13)) on 04/17/2017. Recommendation was for Shingrix vaccine to be given in [place] second dose of Shingrix in 2 months. He had follow-up vaccinations on 08/28/2017. He had his second shingles vaccine. He received 18 month vaccinations- DTaP-Pediatric triple vaccine, Hep B- Pediatrix triple vaccine, Haemophilus influenza B (Hib), inactivated polio virus (IPV), pneumococcal conjugate vaccine 13-valent  (PCV 13) on 02/05/2018.  He is s/p36cycles ofdaratumumab(Darzalex) post transplant(12/05/2016 -10/12/2018).  PET scanon 08/14/2018 revealed no evidence of active myeloma on whole-body FDG PET scan. There was no evidence soft tissue plasmacytoma. There was no clear evidence of lytic lesions on the CT portion exam. Some lucencies in the pelvis were stable. There were chronic compression fractures in the lower thoracic spine. Bone survey on 11/04/2019 revealed stable small lytic lesions noted throughout the skull.  There were no other lytic lesions. Exam was stable.   He has a history of hypomagnesemiathat required IV magnesium (2-4 gm) weekly. He has not required magnesium since 10/24/2016. Patient has atrial fibrillationand is on Eliquis.  Symptomatically, he is doing well.  He has chronic diarrhea.  He has had no issues with infections.  Plan: 1.   Labs today: CBC with diff, CMP, Mg, SPEP, FLCA. 2.Multiple myeloma Clinically,he continues to do well. M spike continues to fluctuate between 0-0.2 gm/dL over the past several months. M-spikeis 0.1 gm/dL today.  PET scan on 08/14/2018 revealed no evidence of active myeloma. Bone survey on 11/04/2019 reviewed.  There were stable small lytic lesions in the skull.  No new lesions. Labs reviewed.  Cycle # 37 daratumumab today. He took his premedications at home Continue valacyclovir prophylaxis (until 3 months s/pdaratumumab). He is no longer followed in the transplant center. [facility].  He has antiemetics and pain medications at home to use on a prn bases.  Interventions are adequate.      3. Macrocytic anemia Hematocrit37.2. Hemoglobin12.7. MCV101.6. B12, folate and TSH were normal in04/20/2020. Ferritin was 40 with an iron saturation of 37% on 10/13/2019.             Continue to  monitor. 4.Neuropathy He has a stable grade I neuropathy. Continue to monitor. 5.Chronic diarrhea Diarrhea is a chronic issue. He has had diarrhea since transplant. He was taking excess amounts of Imodium a day (9-10). Bowel movements appear better in the past week after stopping Imodium. He initially felt greasy foods or diary caused diarrhea.             He has no explanation currently. Daratumumab side effects: 16% diarrhea Review interval follow-up with Dr WAllen Norris  Differential includes NSAIDs, drug injury vs inflammatory bowel disease.  IBD is felt less likely. 6.History of pulmonary embolism(2014) Continue Eliquis. 7.Hypokalemia, resolved Potassium4.4. He remains on 20 meq a day. Continue to monitor. 8.   RTC in 1 month for MD assessment, labs (CBC with differential, CMP, magnesium, SPEP, FLC a, B12, folate, TSH) and cycle #38 daratumumab.  I discussed the  assessment and treatment plan with the patient.  The patient was provided an opportunity to ask questions and all were answered.  The patient agreed with the plan and demonstrated an understanding of the instructions.  The patient was advised to call back if the symptoms worsen or if the condition fails to improve as anticipated.   Lequita Asal, MD, PhD    11/13/2019, 9:05 AM  I, Selena Batten, am acting as scribe for [employer]. Mike Gip, MD, PhD.  I, Emmarie Sannes C. Mike Gip, MD, have reviewed the above documentation for accuracy and completeness, and I agree with the above.

## 2019-11-12 ENCOUNTER — Other Ambulatory Visit: Payer: Self-pay

## 2019-11-12 NOTE — Progress Notes (Signed)
Confirmed Name and DOB. Patient would like to have 90 day supply of Eliquis to save money. Denies any concerns.

## 2019-11-13 ENCOUNTER — Inpatient Hospital Stay: Payer: Medicare Other

## 2019-11-13 ENCOUNTER — Inpatient Hospital Stay: Payer: Medicare Other | Attending: Hematology and Oncology

## 2019-11-13 ENCOUNTER — Encounter: Payer: Self-pay | Admitting: Hematology and Oncology

## 2019-11-13 ENCOUNTER — Inpatient Hospital Stay (HOSPITAL_BASED_OUTPATIENT_CLINIC_OR_DEPARTMENT_OTHER): Payer: Medicare Other | Admitting: Hematology and Oncology

## 2019-11-13 VITALS — BP 145/82 | HR 80 | Temp 96.8°F | Resp 17

## 2019-11-13 VITALS — BP 103/66 | HR 81 | Temp 96.7°F | Resp 18 | Ht 67.0 in | Wt 164.6 lb

## 2019-11-13 DIAGNOSIS — K529 Noninfective gastroenteritis and colitis, unspecified: Secondary | ICD-10-CM

## 2019-11-13 DIAGNOSIS — C9000 Multiple myeloma not having achieved remission: Secondary | ICD-10-CM

## 2019-11-13 DIAGNOSIS — Z7901 Long term (current) use of anticoagulants: Secondary | ICD-10-CM

## 2019-11-13 DIAGNOSIS — Z5112 Encounter for antineoplastic immunotherapy: Secondary | ICD-10-CM | POA: Insufficient documentation

## 2019-11-13 DIAGNOSIS — Z9484 Stem cells transplant status: Secondary | ICD-10-CM | POA: Diagnosis not present

## 2019-11-13 DIAGNOSIS — Z79899 Other long term (current) drug therapy: Secondary | ICD-10-CM | POA: Insufficient documentation

## 2019-11-13 DIAGNOSIS — C9002 Multiple myeloma in relapse: Secondary | ICD-10-CM

## 2019-11-13 DIAGNOSIS — D539 Nutritional anemia, unspecified: Secondary | ICD-10-CM | POA: Diagnosis not present

## 2019-11-13 LAB — COMPREHENSIVE METABOLIC PANEL
ALT: 16 U/L (ref 0–44)
AST: 21 U/L (ref 15–41)
Albumin: 4 g/dL (ref 3.5–5.0)
Alkaline Phosphatase: 43 U/L (ref 38–126)
Anion gap: 9 (ref 5–15)
BUN: 25 mg/dL — ABNORMAL HIGH (ref 8–23)
CO2: 22 mmol/L (ref 22–32)
Calcium: 8.9 mg/dL (ref 8.9–10.3)
Chloride: 103 mmol/L (ref 98–111)
Creatinine, Ser: 1.47 mg/dL — ABNORMAL HIGH (ref 0.61–1.24)
GFR calc Af Amer: 54 mL/min — ABNORMAL LOW (ref >60)
GFR calc non Af Amer: 47 mL/min — ABNORMAL LOW (ref >60)
Glucose, Bld: 153 mg/dL — ABNORMAL HIGH (ref 70–99)
Potassium: 4.4 mmol/L (ref 3.5–5.1)
Sodium: 134 mmol/L — ABNORMAL LOW (ref 135–145)
Total Bilirubin: 0.7 mg/dL (ref 0.3–1.2)
Total Protein: 6.4 g/dL — ABNORMAL LOW (ref 6.5–8.1)

## 2019-11-13 LAB — CBC WITH DIFFERENTIAL/PLATELET
Abs Immature Granulocytes: 0.03 10*3/uL (ref 0.00–0.07)
Basophils Absolute: 0 10*3/uL (ref 0.0–0.1)
Basophils Relative: 1 %
Eosinophils Absolute: 0.1 10*3/uL (ref 0.0–0.5)
Eosinophils Relative: 1 %
HCT: 37.2 % — ABNORMAL LOW (ref 39.0–52.0)
Hemoglobin: 12.7 g/dL — ABNORMAL LOW (ref 13.0–17.0)
Immature Granulocytes: 0 %
Lymphocytes Relative: 21 %
Lymphs Abs: 1.6 10*3/uL (ref 0.7–4.0)
MCH: 34.7 pg — ABNORMAL HIGH (ref 26.0–34.0)
MCHC: 34.1 g/dL (ref 30.0–36.0)
MCV: 101.6 fL — ABNORMAL HIGH (ref 80.0–100.0)
Monocytes Absolute: 0.5 10*3/uL (ref 0.1–1.0)
Monocytes Relative: 6 %
Neutro Abs: 5.5 10*3/uL (ref 1.7–7.7)
Neutrophils Relative %: 71 %
Platelets: 190 10*3/uL (ref 150–400)
RBC: 3.66 MIL/uL — ABNORMAL LOW (ref 4.22–5.81)
RDW: 13.3 % (ref 11.5–15.5)
WBC: 7.8 10*3/uL (ref 4.0–10.5)
nRBC: 0 % (ref 0.0–0.2)

## 2019-11-13 LAB — MAGNESIUM: Magnesium: 2 mg/dL (ref 1.7–2.4)

## 2019-11-13 MED ORDER — SODIUM CHLORIDE 0.9 % IV SOLN
16.0000 mg/kg | Freq: Once | INTRAVENOUS | Status: AC
  Administered 2019-11-13: 1200 mg via INTRAVENOUS
  Filled 2019-11-13: qty 60

## 2019-11-13 MED ORDER — SODIUM CHLORIDE 0.9 % IV SOLN
Freq: Once | INTRAVENOUS | Status: AC
  Filled 2019-11-13: qty 250

## 2019-11-13 MED ORDER — SODIUM CHLORIDE 0.9% FLUSH
10.0000 mL | INTRAVENOUS | Status: DC | PRN
  Administered 2019-11-13: 10 mL via INTRAVENOUS
  Filled 2019-11-13: qty 10

## 2019-11-13 MED ORDER — HEPARIN SOD (PORK) LOCK FLUSH 100 UNIT/ML IV SOLN
500.0000 [IU] | Freq: Once | INTRAVENOUS | Status: AC
  Administered 2019-11-13: 12:00:00 500 [IU] via INTRAVENOUS
  Filled 2019-11-13: qty 5

## 2019-11-13 NOTE — Progress Notes (Signed)
Patient has taken all of his premeds prior to his clinic appt this am.

## 2019-11-14 LAB — PROTEIN ELECTROPHORESIS, SERUM
A/G Ratio: 1.9 — ABNORMAL HIGH (ref 0.7–1.7)
Albumin ELP: 4 g/dL (ref 2.9–4.4)
Alpha-1-Globulin: 0.1 g/dL (ref 0.0–0.4)
Alpha-2-Globulin: 0.7 g/dL (ref 0.4–1.0)
Beta Globulin: 0.9 g/dL (ref 0.7–1.3)
Gamma Globulin: 0.3 g/dL — ABNORMAL LOW (ref 0.4–1.8)
Globulin, Total: 2.1 g/dL — ABNORMAL LOW (ref 2.2–3.9)
M-Spike, %: 0.1 g/dL — ABNORMAL HIGH
Total Protein ELP: 6.1 g/dL (ref 6.0–8.5)

## 2019-11-14 LAB — KAPPA/LAMBDA LIGHT CHAINS
Kappa free light chain: 6.9 mg/L (ref 3.3–19.4)
Kappa, lambda light chain ratio: 1.21 (ref 0.26–1.65)
Lambda free light chains: 5.7 mg/L (ref 5.7–26.3)

## 2019-11-21 ENCOUNTER — Telehealth: Payer: Self-pay

## 2019-11-21 ENCOUNTER — Other Ambulatory Visit: Payer: Self-pay | Admitting: Hematology and Oncology

## 2019-11-21 DIAGNOSIS — C9001 Multiple myeloma in remission: Secondary | ICD-10-CM

## 2019-11-21 NOTE — Telephone Encounter (Signed)
-----   Message from Secundino Ginger sent at 11/21/2019  9:43 AM EST ----- Regarding: refill This patient has no more refills for Potassium CL 20 meq er (90 day supply)  please send to [facility]

## 2019-11-21 NOTE — Telephone Encounter (Signed)
Spoke with the patient to inform him that the potassium has been e scribe this AM at [place] it should be at the pharmacy. The patient was understanding and agreeable.

## 2019-11-21 NOTE — Telephone Encounter (Signed)
...    Ref Range & Units 8 d ago  Potassium 3.5 - 5.1 mmol/L 4.4

## 2019-11-26 ENCOUNTER — Other Ambulatory Visit: Payer: Self-pay

## 2019-11-26 DIAGNOSIS — C9001 Multiple myeloma in remission: Secondary | ICD-10-CM

## 2019-12-04 ENCOUNTER — Ambulatory Visit: Payer: Medicare Other | Admitting: Hematology and Oncology

## 2019-12-04 ENCOUNTER — Other Ambulatory Visit: Payer: Medicare Other

## 2019-12-04 ENCOUNTER — Telehealth: Payer: Self-pay

## 2019-12-04 ENCOUNTER — Ambulatory Visit: Payer: Medicare Other

## 2019-12-04 NOTE — Telephone Encounter (Signed)
Done, thanks

## 2019-12-04 NOTE — Telephone Encounter (Signed)
I changed his pump refill from 4/13 to 4/8 due to DR. Dossie Arbour going on vacation for 2 weeks starting 4/12. Please change in book

## 2019-12-08 NOTE — Progress Notes (Signed)
[facility]  [address], [address] [place], [place] [address] Phone: [phone]  Fax: [phone]   Clinic Day:  12/11/2019  Referring physician: Sofie Hartigan, MD  Chief Complaint: Logan Perez is a 74 y.o. 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   HPI: The patient was last seen in the medical oncology clinic on 11/13/2019. At that time, he was doing well. He had chronic diarrhea. He had no issues with infections. Hematocrit was 37.2, hemoglobin 12.7, MCV 101.6, platelets 190,000, WBC 7,800.  Creatinine was 1.47.  M spike was 0.1 gm/dL.  Kappa free light chains were 6.9 with a ratio 1.21.  He continued Eliquis. Patient received cycle #37 daratumumab.   During the interim, the patient has felt "pretty good". He reports having a "good 4 weeks" since last treatment. His appetite is good. He wakes at night with restless legs. He notes waking up feeling nauseated. His bowels are normal since he stopped drinking generic Boost x 5 days. He will start drinking non-dairy Boost. He is taking Imodium to control bowels.   He denies any bone pain or interim infections.   Past Medical History:  Diagnosis Date  . Anxiety   . Atrial fibrillation ([place])   . BPH (benign prostatic hyperplasia)   . Complication of anesthesia    BAD HEADACHE NIGHT OF FIRST CATARACT  . Compression fracture of lumbar vertebra ([place])   . Difficulty voiding   . Dysrhythmia    A FIB  . Elevated PSA   . GERD (gastroesophageal reflux disease)   . Hearing aid worn    bilateral  . History of kidney stones   . HLD (hyperlipidemia)   . HOH (hard of hearing)   . Hypertension   . Multiple myeloma ([place])   . Neuropathy    feet. 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  . Pain    BACK  . Palpitations   . Pneumonia   . Pulmonary embolism ([place])   . Sepsis ([place])   . Stroke [facility])    TIA, detected on CT  scan. pt was unaware    Past Surgical History:  Procedure Laterality Date  . BACK SURGERY  1994  . CATARACT EXTRACTION W/PHACO Right 05/02/2016   Procedure: CATARACT EXTRACTION PHACO AND INTRAOCULAR LENS PLACEMENT (IOC);  Surgeon: Birder Robson, MD;  Location: [facility];  Service: Ophthalmology;  Laterality: Right;  [place] 1.06 AP% 20.6 CDE 13.70 FLUID PACK LOT # [id number]  . CATARACT EXTRACTION W/PHACO Left 05/16/2016   Procedure: CATARACT EXTRACTION PHACO AND INTRAOCULAR LENS PLACEMENT (IOC);  Surgeon: Birder Robson, MD;  Location: [facility];  Service: Ophthalmology;  Laterality: Left;  [place] 01:43 AP% 19.8 CDE 20.45 FLUID PACK LOT #[id number] H  . COLONOSCOPY WITH PROPOFOL N/A 03/22/2017   Procedure: COLONOSCOPY WITH PROPOFOL;  Surgeon: Lucilla Lame, MD;  Location: [place];  Service: Endoscopy;  Laterality: N/A;  has port  . ESOPHAGOGASTRODUODENOSCOPY (EGD) WITH PROPOFOL  03/22/2017   Procedure: ESOPHAGOGASTRODUODENOSCOPY (EGD) WITH PROPOFOL;  Surgeon: Lucilla Lame, MD;  Location: [place];  Service: Endoscopy;;  . EYE SURGERY    . KNEE ARTHROSCOPY Left 1992  . LIMBAL STEM CELL TRANSPLANT    . PAIN PUMP IMPLANTATION  2012  . PAIN PUMP IMPLANTATION N/A 09/04/2017   Procedure: INTRATHECAL PUMP BATTERY CHANGE;  Surgeon: Milinda Pointer, MD;  Location: [facility];  Service: Neurosurgery;  Laterality: N/A;  . PORTA CATH  INSERTION N/A 12/04/2016   Procedure: Glori Luis Cath Insertion;  Surgeon: Algernon Huxley, MD;  Location: [place] [facility];  Service: Cardiovascular;  Laterality: N/A;  . stem cell implant  2008   [facility]    Family History  Problem Relation Age of Onset  . Cancer Father        throat  . Kidney disease Sister   . Stroke Other   . Stroke Mother   . Bladder Cancer Neg Hx   . Prostate cancer Neg Hx   . Kidney cancer Neg Hx     Social History:  reports that he quit smoking about 44 years ago. His smoking use included cigarettes. He has a 30.00 pack-year smoking  history. He has never used smokeless tobacco. He reports that he does not drink alcohol or use drugs. He has a wire haired dachshund. He lives in [place]. His wife's name is [name]. The patient is alone today.  Allergies:  Allergies  Allergen Reactions  . Azithromycin Diarrhea and Other (See Comments)    Possible cause of C. Diff Possible cause of C. Diff Possible cause of C. Diff Possible cause of C. Diff  . Bee Pollen Other (See Comments)    Sneezing, watery eyes, runny nose  . Pollen Extract Other (See Comments)    Sneezing, watery eyes, runny nose  . 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 Rivaroxaban Rash    Current Medications: Current Outpatient Medications  Medication Sig Dispense Refill  . acetaminophen (TYLENOL) 500 MG tablet Take 500 mg by mouth 2 (two) times a day.    [name] apixaban (ELIQUIS) 5 MG TABS tablet Take 1 tablet (5 mg total) by mouth 2 (two) times daily. 60 tablet 0  . baclofen (LIORESAL) 10 MG tablet Take 1 tablet (10 mg total) by mouth 2 (two) times daily. 180 tablet 3  . bismuth subsalicylate (PEPTO BISMOL) 262 MG/15ML suspension Take 30 mLs by mouth every 6 (six) hours as needed for diarrhea or loose stools.    . Calcium Carb-Cholecalciferol (CALCIUM-VITAMIN D) 500-400 MG-UNIT TABS Take 1 tablet daily by mouth.    . cetirizine (ZYRTEC) 10 MG tablet Take 10 mg by mouth daily as needed for allergies.     . Cholecalciferol (VITAMIN D3) 2000 units capsule Take 2,000 Units daily by mouth.     . Daratumumab (DARZALEX IV) Inject 1,200 mg every 30 (thirty) days into the vein.     [name] dexamethasone (DECADRON) 4 MG tablet TAKE 4 TABLETS BY MOUTH 1 HOUR PRIOR TO INFUSION, THEN 1 TABLET ON DAYS 2 AND 3 AS DIRECTED 30 tablet 0  . diltiazem (CARDIZEM CD) 120 MG 24 hr capsule Take 120 mg daily by mouth.     . diltiazem (CARDIZEM) 60 MG tablet Take 60 mg daily as needed by mouth (for increased heart rate > 140).     [name]  diphenhydrAMINE (BENADRYL) 25 MG tablet Take 25 mg by mouth as directed. With chemo treatment    . fluticasone (FLONASE) 50 MCG/ACT nasal spray Place 1 spray into the nose daily as needed for allergies.     . Hypromellose (ARTIFICIAL TEARS OP) Place 1 drop as needed into both eyes (for dry eyes).    [name] loperamide (IMODIUM) 2 MG capsule Take 4 mg as needed by mouth for diarrhea or loose stools.     [name] loratadine (CLARITIN) 10 MG tablet Take 10 mg by mouth daily as  needed.     . lovastatin (MEVACOR) 20 MG tablet Take 20 mg by mouth every evening.     . montelukast (SINGULAIR) 10 MG tablet TAKE 1 TABLET BY MOUTH DAILY THE DAY BEFORE THE INFUSION, THEN DAY OF, AND THE 2 DAYS AFTER INFUSION 30 tablet 0  . Multiple Vitamins-Iron (MULTIVITAMIN/IRON PO) Take 1 tablet daily by mouth.    . NON FORMULARY IT pump  Fentanyl 1,500.0 mcg/ml Bupivicaine 30.0 mg/ml Clonidine 300.0 mcg/ml Rate 502.2 mcg/day    . omeprazole (PRILOSEC) 20 MG capsule Take 20 mg by mouth daily.     . ondansetron (ZOFRAN) 8 MG tablet Take 1 tablet (8 mg total) by mouth as directed. Take with chemo and can take it as needed 20 tablet 1  . PAIN MANAGEMENT IT PUMP REFILL 1 each by Intrathecal route once for 1 dose. Medication: PF Fentanyl 1,500.0 mcg/ml PF Bupivicaine 30.0 mg/ml PF Clonidine 300.mcg/ml Total Volume: 47m Needed by 07-02-19 @ 1000 1 each 0  . PAIN MANAGEMENT IT PUMP REFILL 1 each by Intrathecal route once for 1 dose. Medication: PF Fentanyl 1,500.0 mcg/ml PF Bupivicaine 30.0 mg/ml PF Clonidine 300.0 mcg/ml Total Volume: 40 ml Needed by 10-16-2019 @ 1000 1 each 0  . potassium chloride SA (KLOR-CON) 20 MEQ tablet TAKE 1 TABLET(20 MEQ) BY MOUTH DAILY 90 tablet 0  . tamsulosin (FLOMAX) 0.4 MG CAPS capsule TAKE 1 CAPSULE(0.4 MG) BY MOUTH DAILY 90 capsule 4  . valACYclovir (VALTREX) 500 MG tablet TAKE 1 TABLET BY MOUTH EVERY DAY 30 tablet 3  . vitamin B-12 (CYANOCOBALAMIN) 1000 MCG tablet Take 1,000 mcg daily by mouth.        No current facility-administered medications for this visit.   Facility-Administered Medications Ordered in Other Visits  Medication Dose Route Frequency Provider Last Rate Last Admin  . heparin lock flush 100 unit/mL  500 Units Intravenous Once Alijah Hyde C, MD      . sodium chloride flush (NS) 0.9 % injection 10 mL  10 mL Intravenous PRN CLequita Asal MD   10 mL at 12/11/19 0950    Review of Systems  Constitutional: Negative.  Negative for chills, diaphoresis, fever, malaise/fatigue and weight loss (up 3 lbs).       Feels "pretty good". He describes a "good 4 weeks".  HENT: Positive for hearing loss (hearing aid). Negative for congestion, ear pain, nosebleeds, sinus pain, sore throat and tinnitus.   Eyes: Negative.  Negative for blurred vision, double vision, photophobia and pain.  Respiratory: Negative.  Negative for cough, hemoptysis, sputum production, shortness of breath and wheezing.   Cardiovascular: Negative for chest pain, palpitations, orthopnea, leg swelling and PND.       H/o atrial fibrillation.  Gastrointestinal: Positive for nausea (using dramamine; in the morning). Negative for abdominal pain, blood in stool, constipation, diarrhea (on Imodium), heartburn, melena and vomiting.       Normal Bowel. Drinking non-dairy Boost. Good appetite.  Genitourinary: Negative.  Negative for dysuria, frequency, hematuria and urgency.  Musculoskeletal: Negative for back pain (chronic; Fentanyl/bupivacaine/clonidine pump; better with tylenol), falls, joint pain, myalgias and neck pain.  Skin: Negative.  Negative for itching and rash.  Neurological: Positive for sensory change (restless legs). Negative for dizziness, tremors, speech change, focal weakness, seizures, weakness and headaches.  Endo/Heme/Allergies: Negative.  Negative for environmental allergies. Does not bruise/bleed easily.  Psychiatric/Behavioral: Negative.  Negative for depression and memory loss. The patient  is not nervous/anxious and does not have insomnia.   All other  systems reviewed and are negative.  Performance status (ECOG): 1  Vitals Blood pressure 123/81, pulse 78, temperature 97.9 F (36.6 C), temperature source Oral, resp. rate 16, weight 167 lb 12.3 oz (76.1 kg), SpO2 100 %.   Physical Exam  Constitutional: He is oriented to person, place, and time. He appears well-developed and well-nourished. No distress.  HENT:  Head: Normocephalic and atraumatic.  Mouth/Throat: Oropharynx is clear and moist. No oropharyngeal exudate.  Short brown hair with graying. Goatee.  Mask.   Eyes: Pupils are equal, round, and reactive to light. Conjunctivae and EOM are normal. No scleral icterus.  Oval glasses. Blue eyes.  Neck: No JVD present.  Cardiovascular: Normal rate, regular rhythm and normal heart sounds. Exam reveals no gallop.  No murmur heard. Pulmonary/Chest: Effort normal and breath sounds normal. No respiratory distress. He has no wheezes. He has no rales.  Abdominal: Soft. Bowel sounds are normal. He exhibits no distension and no mass. There is no abdominal tenderness. There is no rebound and no guarding.  Right sided implanted Fentanyl/bupivacaine/clonidine pump.   Musculoskeletal:        General: No tenderness or edema. Normal range of motion.     Cervical back: Normal range of motion and neck supple.  Lymphadenopathy:       Head (right side): No preauricular, no posterior auricular and no occipital adenopathy present.       Head (left side): No preauricular, no posterior auricular and no occipital adenopathy present.    He has no cervical adenopathy.    He has no axillary adenopathy.       Right: No inguinal and no supraclavicular adenopathy present.       Left: No inguinal and no supraclavicular adenopathy present.  Neurological: He is alert and oriented to person, place, and time.  Skin: Skin is warm and dry. No rash noted. He is not diaphoretic. No erythema. No pallor.   Psychiatric: He has a normal mood and affect. His behavior is normal. Judgment and thought content normal.  Nursing note and vitals reviewed.   Infusion on 12/11/2019  Component Date Value Ref Range Status  . Magnesium 12/11/2019 1.9  1.7 - 2.4 mg/dL Final   Performed at [facility], [address], [place], [place] [address]  . Sodium 12/11/2019 133* 135 - 145 mmol/L Final  . Potassium 12/11/2019 4.1  3.5 - 5.1 mmol/L Final  . Chloride 12/11/2019 101  98 - 111 mmol/L Final  . CO2 12/11/2019 21* 22 - 32 mmol/L Final  . Glucose, Bld 12/11/2019 172* 70 - 99 mg/dL Final   Glucose reference range applies only to samples taken after fasting for at least 8 hours.  . BUN 12/11/2019 18  8 - 23 mg/dL Final  . Creatinine, Ser 12/11/2019 1.40* 0.61 - 1.24 mg/dL Final  . Calcium 12/11/2019 8.9  8.9 - 10.3 mg/dL Final  . Total Protein 12/11/2019 6.4* 6.5 - 8.1 g/dL Final  . Albumin 12/11/2019 4.0  3.5 - 5.0 g/dL Final  . AST 12/11/2019 22  15 - 41 U/L Final  . ALT 12/11/2019 16  0 - 44 U/L Final  . Alkaline Phosphatase 12/11/2019 42  38 - 126 U/L Final  . Total Bilirubin 12/11/2019 0.7  0.3 - 1.2 mg/dL Final  . GFR calc non Af Amer 12/11/2019 49* >60 mL/min Final  . GFR calc Af Amer 12/11/2019 57* >60 mL/min Final  . Anion gap 12/11/2019 11  5 - 15 Final   Performed at  [facility], [address], [place], [place] [address]  . WBC 12/11/2019 7.1  4.0 - 10.5 K/uL Final  . RBC 12/11/2019 3.63* 4.22 - 5.81 MIL/uL Final  . Hemoglobin 12/11/2019 12.7* 13.0 - 17.0 g/dL Final  . HCT 12/11/2019 37.3* 39.0 - 52.0 % Final  . MCV 12/11/2019 102.8* 80.0 - 100.0 fL Final  . MCH 12/11/2019 35.0* 26.0 - 34.0 pg Final  . MCHC 12/11/2019 34.0  30.0 - 36.0 g/dL Final  . RDW 12/11/2019 13.5  11.5 - 15.5 % Final  . Platelets 12/11/2019 197  150 - 400 K/uL Final  . nRBC 12/11/2019 0.0  0.0 - 0.2 % Final  . Neutrophils Relative % 12/11/2019 78  % Final  . Neutro Abs 12/11/2019 5.6   1.7 - 7.7 K/uL Final  . Lymphocytes Relative 12/11/2019 17  % Final  . Lymphs Abs 12/11/2019 1.2  0.7 - 4.0 K/uL Final  . Monocytes Relative 12/11/2019 4  % Final  . Monocytes Absolute 12/11/2019 0.3  0.1 - 1.0 K/uL Final  . Eosinophils Relative 12/11/2019 1  % Final  . Eosinophils Absolute 12/11/2019 0.1  0.0 - 0.5 K/uL Final  . Basophils Relative 12/11/2019 0  % Final  . Basophils Absolute 12/11/2019 0.0  0.0 - 0.1 K/uL Final  . Immature Granulocytes 12/11/2019 0  % Final  . Abs Immature Granulocytes 12/11/2019 0.01  0.00 - 0.07 K/uL Final   Performed at [facility], [address] [place], [place] [address]    Assessment:  Logan Perez is a 74 y.o. male with stage III mutiple myeloma. He initially presented with progressive back pain beginning in 12/2006. MRI revealed "spots and compression fractures". He began Velcade, thalidomide, and Decadron. In 08/2007, he underwent high dose chemotherapy and autologous stem cell transplant. 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  He recurred with a rising M-spike (2.7) with repeat M spike (1.7 gm/dl) in 03/2010. He was initially treated with Velcade (02/08/2010 - 05/10/2010). He then began Revlimid (15 mg 3 weeks on/1 week off) and Decadron (40 mg on day 1, 8, 15, 22). Because of significant side effect with Decadron his dose was decreased to 10 mg once a week in 07/2010.   He was on maintenance Revlimid. Revlimid was initially10 mg 3 weeks on/1 week off. This was changed to 10 mg 2 weeks on/2 weeks off secondary to right nipple tenderness. His dose was increased to 10 mg 3 weeks on/1 week off with Decadron 10 mg a week (on Sundays) and then Revlamid 15 mg 3 weeks on and 1 week off with Decadron on Sundays. He began Pomalyst4 mg 3 weeks on/1 week off with Decadron on 08/27/2015.  SPEPrevealed no monoclonal protein (04/21/2015) and 0.5 gm/dL on 09/22/2015 and 10/20/2015. M spikewas 0.1  on 02/02/2016, 03/01/2016, 03/29/2016, 04/26/2016, and 05/24/2016. M spikewas 0 on 10/11/2016, 11/28/2016, 02/05/2017, 03/21/2017, 08/02/2017, 10/04/2017, 12/27/2017, 01/24/2018, and 02/21/2018. M spikewas 0.1 on 11/01/2017, 0.1 on 11/29/2017, 0 on 12/27/2017, 0 on 01/24/2018, 0 on 02/21/2018, 0.1 on 03/21/2018, 0.3 on 04/18/2018, 0.1 on 05/16/2018, 0 on 06/13/2018, 0.2 on 08/08/2018, 0 on 09/09/2018, 0.1 on 11/04/2018, 0 on 12/02/2018, 0.1 on 12/30/2018, 0 on 01/27/2019, 0.1 on 02/24/2019, 0.1 on 03/24/2019, 0.1 on 04/23/2019, 0 on 05/19/2019, 0.1 on 06/17/2019, 0.2 on 07/15/2019, 0.2 on 08/18/2019, 0.2 on 09/15/2019, 0.1 on 10/13/2019, and 0.1 on 11/13/2019.  Free light chainshave been monitored. Kappa free light chainswere 18.54 on 11/21/2013, 18.37 on 02/20/2014, 18.93  on 05/22/2014, 32.58 (high; normal ratio 1.27) on 08/21/2014, 51.53 (high; elevated ratio of 2.12) on 11/13/2014, 28.08 (ratio 1.73) on 12/09/2014, 23.71 (ratio 2.17) on 01/18/2015, 92.93 (ratio 9.49) on 04/21/2015, 93.44 (ratio 10.28) on 05/26/2015, 255.45 (ratio of 24.05) on 07/14/2015, 373.89 (ratio 48.31) on 08/04/2015, 474.33 (ratio 70.58) on 08/25/2015, 450.76 (ratio 55.44) on 09/22/2015, 453.4 (ratio 59.89) on 10/20/2015, 58.26 (ratio of >40.74) on 02/02/2016, 62.67 (ratio of 37.98) on 03/01/2016, 75.7 (ratio >50.47) on 03/29/2016, 79.7 (ratio 53.13) on 04/26/2016, 108.6 (ratio >72.4) on 05/24/2016, 8.3 (1.46 ratio) on 10/11/2016, 6.7 (0.81 ratio) on 02/05/2017, 7.8 (1.01 ratio) on 03/21/2017,7.6 (ratio 0.63) on 06/01/2017, 8.1 (ratio 1.13) on 11/29/2017, 9.6 (ratio 1.55) on 06/13/2018, 6.6 (ratio 2.06) on 07/11/2018, 6.9 (ratio 0.82) on 08/08/2018, 5.8 (ratio 1.66) on 09/09/2018, 5.8 (ratio 1.05) on 11/04/2018, 5.6(ratio 2.55)on 12/02/2018, 5.5 (ratio 1.96) on 01/27/2019, 7.6 (ratio 2.38) on 06/17/2019, 6.9 (ratio 1.21) on 11/13/2019, and 6.6 (ratio 2.0) on 12/11/2019.  Bone surveyon 12/08/2014 was stable. Bone survey  on 10/21/2015 revealed increase conspicuity of subcentimeter lytic lesions in the calvarium.  Bone marrow aspirate and biopsyon 11/04/2015 revealed an atypical monoclonal plasma cells estimated at 30-40% of marrow cells. Marrow was variably cellular (approximately 45%) with background trilineage hematopoiesis. There was no significant increase in marrow reticulin fibers. Storage iron was present.   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 He develoed herpes zoster in 04/2008. He developed a pulmonary embolismin 05/2013. He was initially on Xarelto, but is now on Eliquis. He had an episode of pneumonia around this time requiring a brief admission. He developed severe lower leg cramps on 08/07/2014 secondary to hypokalemia. Duplex was negative.   He was treated forC difficile colitis(Flagyl completed 07/30/2015). He has a chronic indwelling pain pump.  He received 4 cycles of Pomalyst and Decadron(08/27/2015 - 11/19/2015). Restaging studies document progressive disease. Kappa free light chains are increasing. SPEP revealed 0.5 gm/dL monoclonal protein then 1.3 gm/dL. Bone survey reveals increase conspicuity of subcentimeter lytic lesions in the calvarium. Bone marrow reveals 30-40% plasma cells.   MUGAon 11/30/2015 revealed an ejection fraction of 46%. He is felt not to be a good candidate for Kyprolis. There were no focal wall motion abnormalities. He had a stress echo less than 1 year ago. He has a history of PVCs and atrial fibrillation. He takes Cardizem prn.  He received 17 weeks of daratumumab(Darzalex) (12/09/2015 - 05/25/2016). He tolerated treatment well without side effect.  Bone marrow on 02/09/2016 revealed no diagnostic morphologic evidence of plasma cell myeloma. Marrow was normocellular to hypocellular marrow for age (ranging from 10-40%) with maturing trilineage hematopoiesis and mild multilineage  dyspoiesis. There was patchy mild increase in reticulin. Storage iron was present. Flow cytometry revealed no definitive evidence of monoclonality. There was a non-specific atypical myeloid and monocytic findings with no increase in blasts. Cytogenetics were normal (46, XY).  He is currently68month s/p 2nd autologous stem cell transplanton 06/16/2016. 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 He also experienced atrial fibrillation with intermittent episodes of RVR requiring IV beta blockers. He is on prophylactic valacyclovir for 1 year post transplant.  He started vaccinations(DTaP-Pediatric triple vaccine, Hep B- Pediatrix triple vaccine, Haemophilus influenza B (Hib), inactivated polio virus (IPV), pneumococcal conjugate vaccine 13-valent (PCV 13)) on 04/17/2017. Recommendation was for Shingrix vaccine to be given in [place] second dose of Shingrix in 2 months. He had follow-up vaccinations on 08/28/2017. He had his second  shingles vaccine. He received 18 month vaccinations- DTaP-Pediatric triple vaccine, Hep B- Pediatrix triple vaccine, Haemophilus influenza B (Hib), inactivated polio virus (IPV), pneumococcal conjugate vaccine 13-valent (PCV 13) on 02/05/2018.  He is s/p37cycles ofdaratumumab(Darzalex) post transplant(12/05/2016 -11/12/2018).  PET scanon 08/14/2018 revealed no evidence of active myeloma on whole-body FDG PET scan. There was no evidence soft tissue plasmacytoma. There was no clear evidence of lytic lesions on the CT portion exam. Some lucencies in the pelvis were stable. There were chronic compression fractures in the lower thoracic spine. Bone survey on 11/04/2019 revealed stable small lytic lesions noted throughout the skull.  There were no other lytic lesions. Exam was stable.   He has a history of hypomagnesemiathat required IV magnesium (2-4 gm) weekly. He has not required magnesium since  10/24/2016. Patient has atrial fibrillationand is on Eliquis.  Symptomatically, he is doing well.  He has had a good month.  He denies any bone pain or infections.  Exam is stable.  Plan: 1.   Labs today: CBC with diff, CMP, Mg, SPEP, FLCA, B12, folate, TSH. 2.Multiple myeloma Clinically,he is doing well. M spike continues to fluctuate between 0-0.2 gm/dL over the past several months. M-spikeis pending today. PET scan on 08/14/2018 revealed no evidence of active myeloma. Bone survey on 11/04/2019 revealed stable small lytic lesions in the skull.             No new lesions. Labs reviewed.  Cycle #38 daratumumab today.  He confirmed that he took his premedications at home. Discuss MD appointments with every other infusion.  Patient agrees. Continue valacyclovir prophylaxis (until 3 months s/pdaratumumab). He is no longer followed in the transplant center. Periodic phone follow-up with Dr. Adriana Simas at [facility]. Discuss symptom management.  He has antiemetics and pain medications at home to use on a prn bases.  Interventions are adequate.       3. Macrocytic anemia Hematocrit37.3. Hemoglobin12.7. MCV102.8. B12 and folate were normal today.  TSH was 4.542 (slightly elevated) with a normal free T4. Continue to monitor. 4.Neuropathy He has a stable grade I neuropathy. Continue to monitor. 5.Chronic diarrhea Diarrhea is a chronic issue. He has had diarrhea since transplant. He takes excess amounts of Imodium a day (9-10). He is trying non-generic Boost as well as non dairy Boost. Daratumumab side effects: 16% diarrhea GI notes diarrhea differential: NSAIDs, drug injury vs inflammatory bowel disease.             IBD is felt less likely. 6.History of pulmonary embolism(2014) Continue Eliquis. 7.Hypokalemia, resolved Potassium4.1. He remains on 20 meq a day. Continue  to monitor.. 8.   RTC in 1 month for labs (CBC with diff, CMP, Mg, SPEP) and cycle #39 daratumumab. 9.   RTC in 2 months for MD assessment, labs (CBC with diff, CMP, Mg, SPEP, FLCA) and cycle #40 daratumumab.  I discussed the assessment and treatment plan with the patient.  The patient was provided an opportunity to ask questions and all were answered.  The patient agreed with the plan and demonstrated an understanding of the instructions.  The patient was advised to call back if the symptoms worsen or if the condition fails to improve as anticipated.   Lequita Asal, MD, PhD    12/11/2019, 10:38 AM  I, Selena Batten, am acting as scribe for [employer]. Mike Gip, MD, PhD.  I, Maddelyn Rocca C. Mike Gip, MD, have reviewed the above documentation for accuracy and completeness, and I agree with the above.

## 2019-12-11 ENCOUNTER — Inpatient Hospital Stay: Payer: Medicare Other

## 2019-12-11 ENCOUNTER — Inpatient Hospital Stay: Payer: Medicare Other | Attending: Hematology and Oncology

## 2019-12-11 ENCOUNTER — Inpatient Hospital Stay (HOSPITAL_BASED_OUTPATIENT_CLINIC_OR_DEPARTMENT_OTHER): Payer: Medicare Other | Admitting: Hematology and Oncology

## 2019-12-11 ENCOUNTER — Encounter: Payer: Self-pay | Admitting: Hematology and Oncology

## 2019-12-11 ENCOUNTER — Other Ambulatory Visit: Payer: Self-pay

## 2019-12-11 VITALS — BP 123/81 | HR 78 | Temp 97.9°F | Resp 16 | Wt 167.8 lb

## 2019-12-11 VITALS — BP 134/88 | HR 80 | Resp 16

## 2019-12-11 DIAGNOSIS — Z79899 Other long term (current) drug therapy: Secondary | ICD-10-CM | POA: Diagnosis not present

## 2019-12-11 DIAGNOSIS — C9002 Multiple myeloma in relapse: Secondary | ICD-10-CM | POA: Diagnosis not present

## 2019-12-11 DIAGNOSIS — Z9484 Stem cells transplant status: Secondary | ICD-10-CM | POA: Diagnosis not present

## 2019-12-11 DIAGNOSIS — R7989 Other specified abnormal findings of blood chemistry: Secondary | ICD-10-CM

## 2019-12-11 DIAGNOSIS — C9000 Multiple myeloma not having achieved remission: Secondary | ICD-10-CM

## 2019-12-11 DIAGNOSIS — D539 Nutritional anemia, unspecified: Secondary | ICD-10-CM

## 2019-12-11 DIAGNOSIS — Z7901 Long term (current) use of anticoagulants: Secondary | ICD-10-CM

## 2019-12-11 DIAGNOSIS — Z5112 Encounter for antineoplastic immunotherapy: Secondary | ICD-10-CM

## 2019-12-11 DIAGNOSIS — K529 Noninfective gastroenteritis and colitis, unspecified: Secondary | ICD-10-CM

## 2019-12-11 DIAGNOSIS — G629 Polyneuropathy, unspecified: Secondary | ICD-10-CM | POA: Diagnosis not present

## 2019-12-11 DIAGNOSIS — C9001 Multiple myeloma in remission: Secondary | ICD-10-CM

## 2019-12-11 LAB — CBC WITH DIFFERENTIAL/PLATELET
Abs Immature Granulocytes: 0.01 10*3/uL (ref 0.00–0.07)
Basophils Absolute: 0 10*3/uL (ref 0.0–0.1)
Basophils Relative: 0 %
Eosinophils Absolute: 0.1 10*3/uL (ref 0.0–0.5)
Eosinophils Relative: 1 %
HCT: 37.3 % — ABNORMAL LOW (ref 39.0–52.0)
Hemoglobin: 12.7 g/dL — ABNORMAL LOW (ref 13.0–17.0)
Immature Granulocytes: 0 %
Lymphocytes Relative: 17 %
Lymphs Abs: 1.2 10*3/uL (ref 0.7–4.0)
MCH: 35 pg — ABNORMAL HIGH (ref 26.0–34.0)
MCHC: 34 g/dL (ref 30.0–36.0)
MCV: 102.8 fL — ABNORMAL HIGH (ref 80.0–100.0)
Monocytes Absolute: 0.3 10*3/uL (ref 0.1–1.0)
Monocytes Relative: 4 %
Neutro Abs: 5.6 10*3/uL (ref 1.7–7.7)
Neutrophils Relative %: 78 %
Platelets: 197 10*3/uL (ref 150–400)
RBC: 3.63 MIL/uL — ABNORMAL LOW (ref 4.22–5.81)
RDW: 13.5 % (ref 11.5–15.5)
WBC: 7.1 10*3/uL (ref 4.0–10.5)
nRBC: 0 % (ref 0.0–0.2)

## 2019-12-11 LAB — COMPREHENSIVE METABOLIC PANEL
ALT: 16 U/L (ref 0–44)
AST: 22 U/L (ref 15–41)
Albumin: 4 g/dL (ref 3.5–5.0)
Alkaline Phosphatase: 42 U/L (ref 38–126)
Anion gap: 11 (ref 5–15)
BUN: 18 mg/dL (ref 8–23)
CO2: 21 mmol/L — ABNORMAL LOW (ref 22–32)
Calcium: 8.9 mg/dL (ref 8.9–10.3)
Chloride: 101 mmol/L (ref 98–111)
Creatinine, Ser: 1.4 mg/dL — ABNORMAL HIGH (ref 0.61–1.24)
GFR calc Af Amer: 57 mL/min — ABNORMAL LOW (ref >60)
GFR calc non Af Amer: 49 mL/min — ABNORMAL LOW (ref >60)
Glucose, Bld: 172 mg/dL — ABNORMAL HIGH (ref 70–99)
Potassium: 4.1 mmol/L (ref 3.5–5.1)
Sodium: 133 mmol/L — ABNORMAL LOW (ref 135–145)
Total Bilirubin: 0.7 mg/dL (ref 0.3–1.2)
Total Protein: 6.4 g/dL — ABNORMAL LOW (ref 6.5–8.1)

## 2019-12-11 LAB — T4, FREE: Free T4: 0.99 ng/dL (ref 0.61–1.12)

## 2019-12-11 LAB — MAGNESIUM: Magnesium: 1.9 mg/dL (ref 1.7–2.4)

## 2019-12-11 LAB — FOLATE: Folate: 58.8 ng/mL (ref >5.9)

## 2019-12-11 LAB — VITAMIN B12: Vitamin B-12: 402 pg/mL (ref 180–914)

## 2019-12-11 LAB — TSH: TSH: 4.542 u[IU]/mL — ABNORMAL HIGH (ref 0.350–4.500)

## 2019-12-11 MED ORDER — SODIUM CHLORIDE 0.9 % IV SOLN
Freq: Once | INTRAVENOUS | Status: AC
  Filled 2019-12-11: qty 250

## 2019-12-11 MED ORDER — SODIUM CHLORIDE 0.9 % IV SOLN
16.0000 mg/kg | Freq: Once | INTRAVENOUS | Status: AC
  Administered 2019-12-11: 11:00:00 1200 mg via INTRAVENOUS
  Filled 2019-12-11: qty 60

## 2019-12-11 MED ORDER — HEPARIN SOD (PORK) LOCK FLUSH 100 UNIT/ML IV SOLN
500.0000 [IU] | Freq: Once | INTRAVENOUS | Status: AC
  Administered 2019-12-11: 500 [IU] via INTRAVENOUS
  Filled 2019-12-11: qty 5

## 2019-12-11 MED ORDER — SODIUM CHLORIDE 0.9% FLUSH
10.0000 mL | INTRAVENOUS | Status: DC | PRN
  Administered 2019-12-11: 10 mL via INTRAVENOUS
  Filled 2019-12-11: qty 10

## 2019-12-11 NOTE — Progress Notes (Signed)
Pt. Here for follow up. States he has had a "good four weeks" since last treatment. Denies any concerns.

## 2019-12-11 NOTE — Patient Instructions (Signed)
Daratumumab injection What is this medicine? DARATUMUMAB (dar a toom ue mab) is a monoclonal antibody. It is used to treat multiple myeloma. This medicine may be used for other purposes; ask your health care provider or pharmacist if you have questions. COMMON BRAND NAME(S): DARZALEX What should I tell my health care provider before I take this medicine? They need to know if you have any of these conditions:  infection (especially a virus infection such as chickenpox, herpes, or hepatitis B virus)  lung or breathing disease  an unusual or allergic reaction to daratumumab, other medicines, foods, dyes, or preservatives  pregnant or trying to get pregnant  breast-feeding How should I use this medicine? This medicine is for infusion into a vein. It is given by a health care professional in a hospital or clinic setting. Talk to your pediatrician regarding the use of this medicine in children. Special care may be needed. Overdosage: If you think you have taken too much of this medicine contact a poison control center or emergency room at once. NOTE: This medicine is only for you. Do not share this medicine with others. What if I miss a dose? Keep appointments for follow-up doses as directed. It is important not to miss your dose. Call your doctor or health care professional if you are unable to keep an appointment. What may interact with this medicine? Interactions have not been studied. This list may not describe all possible interactions. Give your health care provider a list of all the medicines, herbs, non-prescription drugs, or dietary supplements you use. Also tell them if you smoke, drink alcohol, or use illegal drugs. Some items may interact with your medicine. What should I watch for while using this medicine? This drug may make you feel generally unwell. Report any side effects. Continue your course of treatment even though you feel ill unless your doctor tells you to stop. This  medicine can cause serious allergic reactions. To reduce your risk you may need to take medicine before treatment with this medicine. Take your medicine as directed. This medicine can affect the results of blood tests to match your blood type. These changes can last for up to 6 months after the final dose. Your healthcare provider will do blood tests to match your blood type before you start treatment. Tell all of your healthcare providers that you are being treated with this medicine before receiving a blood transfusion. This medicine can affect the results of some tests used to determine treatment response; extra tests may be needed to evaluate response. Do not become pregnant while taking this medicine or for 3 months after stopping it. Women should inform their doctor if they wish to become pregnant or think they might be pregnant. There is a potential for serious side effects to an unborn child. Talk to your health care professional or pharmacist for more information. What side effects may I notice from receiving this medicine? Side effects that you should report to your doctor or health care professional as soon as possible:  allergic reactions like skin rash, itching or hives, swelling of the face, lips, or tongue  breathing problems  chills  cough  dizziness  feeling faint or lightheaded  headache  low blood counts - this medicine may decrease the number of white blood cells, red blood cells and platelets. You may be at increased risk for infections and bleeding.  nausea, vomiting  shortness of breath  signs of decreased platelets or bleeding - bruising, pinpoint red spots on  the skin, black, tarry stools, blood in the urine  signs of decreased red blood cells - unusually weak or tired, feeling faint or lightheaded, falls  signs of infection - fever or chills, cough, sore throat, pain or difficulty passing urine  signs and symptoms of liver injury like dark yellow or brown  urine; general ill feeling or flu-like symptoms; light-colored stools; loss of appetite; right upper belly pain; unusually weak or tired; yellowing of the eyes or skin Side effects that usually do not require medical attention (report to your doctor or health care professional if they continue or are bothersome):  back pain  constipation  diarrhea  joint pain  muscle cramps  pain, tingling, numbness in the hands or feet  swelling of the ankles, feet, hands  tiredness  trouble sleeping This list may not describe all possible side effects. Call your doctor for medical advice about side effects. You may report side effects to FDA at 1-800-FDA-1088. Where should I keep my medicine? This drug is given in a hospital or clinic and will not be stored at home. NOTE: This sheet is a summary. It may not cover all possible information. If you have questions about this medicine, talk to your doctor, pharmacist, or health care provider.  2020 Elsevier/Gold Standard (2019-06-03 18:10:54)  

## 2019-12-12 LAB — KAPPA/LAMBDA LIGHT CHAINS
Kappa free light chain: 6.6 mg/L (ref 3.3–19.4)
Kappa, lambda light chain ratio: 2 — ABNORMAL HIGH (ref 0.26–1.65)
Lambda free light chains: 3.3 mg/L — ABNORMAL LOW (ref 5.7–26.3)

## 2019-12-14 DIAGNOSIS — R7989 Other specified abnormal findings of blood chemistry: Secondary | ICD-10-CM | POA: Insufficient documentation

## 2019-12-15 LAB — PROTEIN ELECTROPHORESIS, SERUM
A/G Ratio: 2 — ABNORMAL HIGH (ref 0.7–1.7)
Albumin ELP: 3.9 g/dL (ref 2.9–4.4)
Alpha-1-Globulin: 0.1 g/dL (ref 0.0–0.4)
Alpha-2-Globulin: 0.9 g/dL (ref 0.4–1.0)
Beta Globulin: 0.8 g/dL (ref 0.7–1.3)
Gamma Globulin: 0.2 g/dL — ABNORMAL LOW (ref 0.4–1.8)
Globulin, Total: 2 g/dL — ABNORMAL LOW (ref 2.2–3.9)
Total Protein ELP: 5.9 g/dL — ABNORMAL LOW (ref 6.0–8.5)

## 2019-12-16 ENCOUNTER — Other Ambulatory Visit: Payer: Self-pay | Admitting: Hematology and Oncology

## 2019-12-16 DIAGNOSIS — C9001 Multiple myeloma in remission: Secondary | ICD-10-CM

## 2019-12-23 DIAGNOSIS — M7989 Other specified soft tissue disorders: Secondary | ICD-10-CM | POA: Insufficient documentation

## 2019-12-31 ENCOUNTER — Other Ambulatory Visit: Payer: Self-pay

## 2019-12-31 MED ORDER — PAIN MANAGEMENT IT PUMP REFILL: 1.0000 | Freq: Once | INTRATHECAL | 0 refills | Status: DC

## 2020-01-01 NOTE — Progress Notes (Signed)
Pharmacist Chemotherapy Monitoring - Follow Up Assessment    I verify that I have reviewed each item in the below checklist:  . Regimen for the patient is scheduled for the appropriate day and plan matches scheduled date. [name] Appropriate non-routine labs are ordered dependent on drug ordered. . If applicable, additional medications reviewed and ordered per protocol based on lifetime cumulative doses and/or treatment regimen.   Plan for follow-up and/or issues identified: No . I-vent associated with next due treatment: No . MD and/or nursing notified: No  Abdulaziz Toman K 01/01/2020 2:33 PM

## 2020-01-08 ENCOUNTER — Other Ambulatory Visit: Payer: Self-pay

## 2020-01-08 ENCOUNTER — Inpatient Hospital Stay: Payer: Medicare Other | Attending: Hematology and Oncology

## 2020-01-08 ENCOUNTER — Inpatient Hospital Stay: Payer: Medicare Other

## 2020-01-08 ENCOUNTER — Other Ambulatory Visit: Payer: Self-pay | Admitting: Hematology and Oncology

## 2020-01-08 VITALS — BP 134/88 | HR 73 | Temp 97.0°F | Resp 18

## 2020-01-08 DIAGNOSIS — Z5112 Encounter for antineoplastic immunotherapy: Secondary | ICD-10-CM | POA: Diagnosis not present

## 2020-01-08 DIAGNOSIS — C9002 Multiple myeloma in relapse: Secondary | ICD-10-CM

## 2020-01-08 DIAGNOSIS — C9000 Multiple myeloma not having achieved remission: Secondary | ICD-10-CM

## 2020-01-08 DIAGNOSIS — Z79899 Other long term (current) drug therapy: Secondary | ICD-10-CM | POA: Diagnosis not present

## 2020-01-08 LAB — CBC WITH DIFFERENTIAL/PLATELET
Abs Immature Granulocytes: 0.03 10*3/uL (ref 0.00–0.07)
Basophils Absolute: 0 10*3/uL (ref 0.0–0.1)
Basophils Relative: 0 %
Eosinophils Absolute: 0.1 10*3/uL (ref 0.0–0.5)
Eosinophils Relative: 1 %
HCT: 36.6 % — ABNORMAL LOW (ref 39.0–52.0)
Hemoglobin: 12.7 g/dL — ABNORMAL LOW (ref 13.0–17.0)
Immature Granulocytes: 0 %
Lymphocytes Relative: 16 %
Lymphs Abs: 1.2 10*3/uL (ref 0.7–4.0)
MCH: 35.6 pg — ABNORMAL HIGH (ref 26.0–34.0)
MCHC: 34.7 g/dL (ref 30.0–36.0)
MCV: 102.5 fL — ABNORMAL HIGH (ref 80.0–100.0)
Monocytes Absolute: 0.3 10*3/uL (ref 0.1–1.0)
Monocytes Relative: 4 %
Neutro Abs: 6.2 10*3/uL (ref 1.7–7.7)
Neutrophils Relative %: 79 %
Platelets: 201 10*3/uL (ref 150–400)
RBC: 3.57 MIL/uL — ABNORMAL LOW (ref 4.22–5.81)
RDW: 13 % (ref 11.5–15.5)
WBC: 7.9 10*3/uL (ref 4.0–10.5)
nRBC: 0 % (ref 0.0–0.2)

## 2020-01-08 LAB — COMPREHENSIVE METABOLIC PANEL
ALT: 17 U/L (ref 0–44)
AST: 22 U/L (ref 15–41)
Albumin: 4 g/dL (ref 3.5–5.0)
Alkaline Phosphatase: 44 U/L (ref 38–126)
Anion gap: 10 (ref 5–15)
BUN: 17 mg/dL (ref 8–23)
CO2: 21 mmol/L — ABNORMAL LOW (ref 22–32)
Calcium: 8.7 mg/dL — ABNORMAL LOW (ref 8.9–10.3)
Chloride: 104 mmol/L (ref 98–111)
Creatinine, Ser: 1.59 mg/dL — ABNORMAL HIGH (ref 0.61–1.24)
GFR calc Af Amer: 49 mL/min — ABNORMAL LOW (ref >60)
GFR calc non Af Amer: 42 mL/min — ABNORMAL LOW (ref >60)
Glucose, Bld: 143 mg/dL — ABNORMAL HIGH (ref 70–99)
Potassium: 4.2 mmol/L (ref 3.5–5.1)
Sodium: 135 mmol/L (ref 135–145)
Total Bilirubin: 0.6 mg/dL (ref 0.3–1.2)
Total Protein: 6.3 g/dL — ABNORMAL LOW (ref 6.5–8.1)

## 2020-01-08 LAB — MAGNESIUM: Magnesium: 1.9 mg/dL (ref 1.7–2.4)

## 2020-01-08 MED ORDER — HEPARIN SOD (PORK) LOCK FLUSH 100 UNIT/ML IV SOLN
500.0000 [IU] | Freq: Once | INTRAVENOUS | Status: AC
  Administered 2020-01-08: 500 [IU] via INTRAVENOUS
  Filled 2020-01-08: qty 5

## 2020-01-08 MED ORDER — DEXAMETHASONE 4 MG PO TABS: ORAL_TABLET | ORAL | 0 refills | Status: DC

## 2020-01-08 MED ORDER — SODIUM CHLORIDE 0.9 % IV SOLN
16.0000 mg/kg | Freq: Once | INTRAVENOUS | Status: AC
  Administered 2020-01-08: 1200 mg via INTRAVENOUS
  Filled 2020-01-08: qty 60

## 2020-01-08 MED ORDER — HEPARIN SOD (PORK) LOCK FLUSH 100 UNIT/ML IV SOLN
INTRAVENOUS | Status: AC
  Filled 2020-01-08: qty 5

## 2020-01-08 MED ORDER — SODIUM CHLORIDE 0.9% FLUSH
10.0000 mL | INTRAVENOUS | Status: DC | PRN
  Administered 2020-01-08: 10 mL via INTRAVENOUS
  Filled 2020-01-08: qty 10

## 2020-01-08 MED ORDER — SODIUM CHLORIDE 0.9 % IV SOLN
Freq: Once | INTRAVENOUS | Status: AC
  Filled 2020-01-08: qty 250

## 2020-01-08 NOTE — Progress Notes (Signed)
  This Rx cannot be signed.  I think it is in the wrong format.  M

## 2020-01-08 NOTE — Progress Notes (Signed)
Patient here for his next chemotherapy treatment. He has taken all of his premeds prior to entering clinic this morning. Feels well today. Voices no concerns.[name]

## 2020-01-09 LAB — PROTEIN ELECTROPHORESIS, SERUM
A/G Ratio: 1.4 (ref 0.7–1.7)
Albumin ELP: 3.6 g/dL (ref 2.9–4.4)
Alpha-1-Globulin: 0.2 g/dL (ref 0.0–0.4)
Alpha-2-Globulin: 0.8 g/dL (ref 0.4–1.0)
Beta Globulin: 1 g/dL (ref 0.7–1.3)
Gamma Globulin: 0.7 g/dL (ref 0.4–1.8)
Globulin, Total: 2.6 g/dL (ref 2.2–3.9)
M-Spike, %: 0.3 g/dL — ABNORMAL HIGH
Total Protein ELP: 6.2 g/dL (ref 6.0–8.5)

## 2020-01-15 ENCOUNTER — Encounter: Payer: Self-pay | Admitting: Pain Medicine

## 2020-01-15 ENCOUNTER — Other Ambulatory Visit: Payer: Self-pay

## 2020-01-15 ENCOUNTER — Ambulatory Visit: Payer: Medicare Other | Attending: Pain Medicine | Admitting: Pain Medicine

## 2020-01-15 VITALS — BP 132/87 | HR 98 | Temp 97.3°F | Resp 16 | Ht 67.0 in | Wt 163.0 lb

## 2020-01-15 DIAGNOSIS — Z451 Encounter for adjustment and management of infusion pump: Secondary | ICD-10-CM | POA: Diagnosis present

## 2020-01-15 DIAGNOSIS — S22080S Wedge compression fracture of T11-T12 vertebra, sequela: Secondary | ICD-10-CM | POA: Diagnosis present

## 2020-01-15 DIAGNOSIS — M5416 Radiculopathy, lumbar region: Secondary | ICD-10-CM | POA: Diagnosis present

## 2020-01-15 DIAGNOSIS — G8929 Other chronic pain: Secondary | ICD-10-CM | POA: Insufficient documentation

## 2020-01-15 DIAGNOSIS — G894 Chronic pain syndrome: Secondary | ICD-10-CM | POA: Diagnosis present

## 2020-01-15 DIAGNOSIS — M545 Low back pain, unspecified: Secondary | ICD-10-CM

## 2020-01-15 DIAGNOSIS — Z95828 Presence of other vascular implants and grafts: Secondary | ICD-10-CM

## 2020-01-15 DIAGNOSIS — C9001 Multiple myeloma in remission: Secondary | ICD-10-CM | POA: Diagnosis present

## 2020-01-15 DIAGNOSIS — G893 Neoplasm related pain (acute) (chronic): Secondary | ICD-10-CM | POA: Diagnosis present

## 2020-01-15 NOTE — Patient Instructions (Signed)
Opioid Overdose Opioids are drugs that are often used to treat pain. Opioids include illegal drugs, such as heroin, as well as prescription pain medicines, such as codeine, morphine, hydrocodone, oxycodone, and fentanyl. An opioid overdose happens when you take too much of an opioid. An overdose may be intentional or accidental and can happen with any type of opioid. The effects of an overdose can be mild, dangerous, or even deadly. Opioid overdose is a medical emergency. What are the causes? This condition may be caused by:  Taking too much of an opioid on purpose.  Taking too much of an opioid by accident.  Using two or more substances that contain opioids at the same time.  Taking an opioid with a substance that affects your heart, breathing, or blood pressure. These include alcohol, tranquilizers, sleeping pills, illegal drugs, and some over-the-counter medicines. This condition may also happen due to an error made by:  A health care provider who prescribes a medicine.  The pharmacist who fills the prescription order. What increases the risk? This condition is more likely in:  Children. They may be attracted to colorful pills. Because of a child's small size, even a small amount of a drug can be dangerous.  Older people. They may be taking many different drugs. Older people may have difficulty reading labels or remembering when they last took their medicine. They may also be more sensitive to the effects of opioids.  People with chronic medical conditions, especially heart, liver, kidney, or neurological diseases.  People who take an opioid for a long period of time.  People who use: ? Illegal drugs. IV heroin is especially dangerous. ? Other substances, including alcohol, while using an opioid.  People who have: ? A history of drug or alcohol abuse. ? Certain mental health conditions. ? A history of previous drug overdoses.  People who take opioids that are not prescribed  for them. What are the signs or symptoms? Symptoms of this condition depend on the type of opioid and the amount that was taken. Common symptoms include:  Sleepiness or difficulty waking from sleep.  Decrease in attention.  Confusion.  Slurred speech.  Slowed breathing and a slow pulse (bradycardia).  Nausea and vomiting.  Abnormally small pupils. Signs and symptoms that require emergency treatment include:  Cold, clammy, and pale skin.  Blue lips and fingernails.  Vomiting.  Gurgling sounds in the throat.  A pulse that is very slow or difficult to detect.  Breathing that is very irregular, slow, noisy, or difficult to detect.  Limp body.  Inability to respond to speech or be awakened from sleep (stupor).  Seizures. How is this diagnosed? This condition is diagnosed based on your symptoms and medical history. It is important to tell your health care provider:  About all of the opioids that you took.  When you took the opioids.  Whether you were drinking alcohol or using marijuana, cocaine, or other drugs. Your health care provider will do a physical exam. This exam may include:  Checking and monitoring your heart rate and rhythm, breathing rate, temperature, and blood pressure (vital signs).  Measuring oxygen levels in your blood.  Checking for abnormally small pupils. You may also have blood tests or urine tests. You may have X-rays if you are having severe breathing problems. How is this treated? This condition requires immediate medical treatment and hospitalization. Treatment is given in the hospital intensive care (ICU) setting. Supporting your vital signs and your breathing is the first step in  treating an opioid overdose. Treatment may also include:  Giving salts and minerals (electrolytes) along with fluids through an IV.  Inserting a breathing tube (endotracheal tube) in your airway to help you breathe if you cannot breathe on your own or you are in  danger of not being able to breathe on your own.  Giving oxygen through a small tube under your nose.  Passing a tube through your nose and into your stomach (nasogastric tube, or NG tube) to empty your stomach.  Giving medicines that: ? Increase your blood pressure. ? Relieve nausea and vomiting. ? Relieve abdominal pain and cramping. ? Reverse the effects of the opioid (naloxone).  Monitoring your heart and oxygen levels.  Ongoing counseling and mental health support if you intentionally overdosed or used an illegal drug. Follow these instructions at home:  Medicines  Take over-the-counter and prescription medicines only as told by your health care provider.  Always ask your health care provider about possible side effects and interactions of any new medicine that you start taking.  Keep a list of all the medicines that you take, including over-the-counter medicines. Bring this list with you to all your medical visits. General instructions  Drink enough fluid to keep your urine pale yellow.  Keep all follow-up visits as told by your health care provider. This is important. How is this prevented?  Read the drug inserts that come with your opioid pain medicines.  Take medicines only as told by your health care provider. Do not take more medicine than you are told. Do not take medicines more frequently than you are told.  Do not drink alcohol or take sedatives when taking opioids.  Do not use illegal or recreational drugs, including cocaine, ecstasy, and marijuana.  Do not take opioid medicines that are not prescribed for you.  Store all medicines in safety containers that are out of the reach of children.  Get help if you are struggling with: ? Alcohol or drug use. ? Depression or another mental health problem. ? Thoughts of hurting yourself or another person.  Keep the phone number of your local poison control center near your phone or in your mobile phone. In the  [place] the hotline of the National Poison Control Center is (800) 222-1222.  If you were prescribed naloxone, make sure you understand how to take it. Contact a health care provider if you:  Need help understanding how to take your pain medicines.  Feel your medicines are too strong.  Are concerned that your pain medicines are not working well for your pain.  Develop new symptoms or side effects when you are taking medicines. Get help right away if:  You or someone else is having symptoms of an opioid overdose. Get help even if you are not sure.  You have serious thoughts about hurting yourself or others.  You have: ? Chest pain. ? Difficulty breathing. ? A loss of consciousness. These symptoms may represent a serious problem that is an emergency. Do not wait to see if the symptoms will go away. Get medical help right away. Call your local emergency services (911 in the [place] Do not drive yourself to the hospital. If you ever feel like you may hurt yourself or others, or have thoughts about taking your own life, get help right away. You can go to your nearest emergency department or call:  Your local emergency services (911 in the [place]  A suicide crisis helpline, such as the National Suicide Prevention Lifeline at   1-800-273-8255. This is open 24 hours a day. Summary  Opioids are drugs that are often used to treat pain. Opioids include illegal drugs, such as heroin, as well as prescription pain medicines.  An opioid overdose happens when you take too much of an opioid.  Overdoses can be intentional or accidental.  Opioid overdose is very dangerous. It is a life-threatening emergency.  If you or someone you know is experiencing an opioid overdose, get help right away. This information is not intended to replace advice given to you by your health care provider. Make sure you discuss any questions you have with your health care provider. Document Revised: 09/12/2018 Document  Reviewed: 09/12/2018 [employer] Patient Education  2020 [employer]  

## 2020-01-15 NOTE — Progress Notes (Signed)
PROVIDER NOTE: Information contained herein reflects review and annotations entered in association with encounter. Interpretation of such information and data should be left to medically-trained personnel. Information provided to patient can be located elsewhere in the medical record under "Patient Instructions". Document created using STT-dictation technology, any transcriptional errors that may result from process are unintentional.    Patient: Logan Perez  Service Category: Procedure  Provider: Gaspar Cola, MD  DOB: 1946-06-26  DOS: 01/15/2020  Location: [facility]  MRN: [id number]  Setting: Ambulatory - outpatient  Referring Provider: Sofie Hartigan, MD  Type: Established Patient  Specialty: Interventional Pain Management  PCP: Logan Hartigan, MD   Primary Reason for Visit: Interventional Pain Management Treatment. CC: Back Pain  Procedure:          Intrathecal Drug Delivery System (IDDS):  Type: Reservoir Refill [phone]) No rate change Region: Abdominal Laterality: Right  Type of Pump: Medtronic Synchromed II Delivery Route: Intrathecal Type of Pain Treated: Neuropathic/Nociceptive Primary Medication Class: Opioid/opiate  Medication, Concentration, Infusion Program, & Delivery Rate: Please see scanned programming printout.   Indications: 1. Chronic pain syndrome   2. Multiple myeloma in remission (Logan Perez)   3. Compression fracture of T12 vertebra (HCC) (70-75% magnitude) (with mild retropulsion)   4. Chronic lumbar radicular pain   5. Presence of implanted infusion pump (Medtronic, programmable, intrathecal pump)   6. Encounter for adjustment or management of infusion pump   7. Cancer associated pain   8. Chronic low back pain    Pain Assessment: Self-Reported Pain Score: 2 /10             Reported level is compatible with observation.        Pharmacotherapy Assessment  Analgesic: Intrathecal PF-Fentanyl 502.2 mcg/day (20.9  mcg/hr) MME/day: 50.16 mg/day.   Monitoring: [place] PMP: PDMP reviewed during this encounter.       Pharmacotherapy: No side-effects or adverse reactions reported. Compliance: No problems identified. Effectiveness: Clinically acceptable. Plan: Refer to "POC".  UDS: No results found for: SUMMARY Intrathecal Pump Therapy Assessment  Manufacturer: Medtronic Synchromed Type: Programmable Volume: 40 mL reservoir MRI compatibility: Yes   Drug content:  Primary Medication Class: Opioid Primary Medication:PF-Fentanyl(1500 mcg/mL) Secondary Medication:PF-Bupivacaine(30 mg/mL) Other Medication:PF-Clonidine(300 mcg/mL)   Programming:  Type: Simple continuous. See pump readout for details.   Changes:  Medication Change: None at this point Rate Change: No change in rate  Reported side-effects or adverse reactions: None reported  Effectiveness: Described as relatively effective, allowing for increase in activities of daily living (ADL) Clinically meaningful improvement in function (CMIF): Sustained CMIF goals met  Plan: Pump refill today  Pre-op Assessment:  Logan Perez is a 74 y.o. (year old), male patient, seen today for interventional treatment. He  has a past surgical history that includes Knee arthroscopy (Left, 1992); Pain pump implantation (2012); stem cell implant (2008); Cataract extraction w/PHACO (Right, 05/02/2016); Cataract extraction w/PHACO (Left, 05/16/2016); Limbal stem cell transplant; PORTA CATH INSERTION (N/A, 12/04/2016); Colonoscopy with propofol (N/A, 03/22/2017); Esophagogastroduodenoscopy (egd) with propofol (03/22/2017); Eye surgery; Back surgery (1994); and Pain pump implantation (N/A, 09/04/2017). Logan Perez has a current medication list which includes the following prescription(s): acetaminophen, baclofen, bismuth subsalicylate, calcium-vitamin d, cetirizine, vitamin d3, daratumumab, dexamethasone, diltiazem, diltiazem, diphenhydramine, eliquis, fluticasone,  carboxymethylcellulose sodium, loperamide, loratadine, lovastatin, montelukast, multiple vitamins-iron, NON FORMULARY, omeprazole, ondansetron, potassium chloride sa, tamsulosin, valacyclovir, vitamin b-12, and PAIN MANAGEMENT IT PUMP REFILL. His primarily concern today is the Back Pain  Initial Vital Signs:  Pulse/HCG Rate:  98  Temp: (!) 97.3 F (36.3 C) Resp: 16 BP: 132/87 SpO2: 98 %  BMI: Estimated body mass index is 25.53 kg/m as calculated from the following:   Height as of this encounter: '5\' 7"'  (1.702 m).   Weight as of this encounter: 163 lb (73.9 kg).  Risk Assessment: Allergies: Reviewed. He is allergic to azithromycin; bee pollen; pollen extract; zoledronic acid; and rivaroxaban.  Allergy Precautions: None required Coagulopathies: Reviewed. None identified.  Blood-thinner therapy: None at this time Active Infection(s): Reviewed. None identified. Logan Perez is afebrile  Site Confirmation: Logan Perez was asked to confirm the procedure and laterality before marking the site Procedure checklist: Completed Consent: Before the procedure and under the influence of no sedative(s), amnesic(s), or anxiolytics, the patient was informed of the treatment options, risks and possible complications. To fulfill our ethical and legal obligations, as recommended by the American Medical Association's Code of Ethics, I have informed the patient of my clinical impression; the nature and purpose of the treatment or procedure; the risks, benefits, and possible complications of the intervention; the alternatives, including doing nothing; the risk(s) and benefit(s) of the alternative treatment(s) or procedure(s); and the risk(s) and benefit(s) of doing nothing.  Logan Perez was provided with information about the general risks and possible complications associated with most interventional procedures. These include, but are not limited to: failure to achieve desired goals, infection, bleeding, organ or  nerve damage, allergic reactions, paralysis, and/or death.  In addition, he was informed of those risks and possible complications associated to this particular procedure, which include, but are not limited to: damage to the implant; failure to decrease pain; local, systemic, or serious CNS infections, intraspinal abscess with possible cord compression and paralysis, or life-threatening such as meningitis; bleeding; organ damage; nerve injury or damage with subsequent sensory, motor, and/or autonomic system dysfunction, resulting in transient or permanent pain, numbness, and/or weakness of one or several areas of the body; allergic reactions, either minor or major life-threatening, such as anaphylactic or anaphylactoid reactions.  Furthermore, Mr. Sotelo was informed of those risks and complications associated with the medications. These include, but are not limited to: allergic reactions (i.e.: anaphylactic or anaphylactoid reactions); endorphine suppression; bradycardia and/or hypotension; water retention and/or peripheral vascular relaxation leading to lower extremity edema and possible stasis ulcers; respiratory depression and/or shortness of breath; decreased metabolic rate leading to weight gain; swelling or edema; medication-induced neural toxicity; particulate matter embolism and blood vessel occlusion with resultant organ, and/or nervous system infarction; and/or intrathecal granuloma formation with possible spinal cord compression and permanent paralysis.  Before refilling the pump Mr. Morrone was informed that some of the medications used in the devise may not be FDA approved for such use and therefore it constitutes an off-label use of the medications.  Finally, he was informed that Medicine is not an exact science; therefore, there is also the possibility of unforeseen or unpredictable risks and/or possible complications that may result in a catastrophic outcome. The patient indicated having  understood very clearly. We have given the patient no guarantees and we have made no promises. Enough time was given to the patient to ask questions, all of which were answered to the patient's satisfaction. Mr. Minor has indicated that he wanted to continue with the procedure. Attestation: I, the ordering provider, attest that I have discussed with the patient the benefits, risks, side-effects, alternatives, likelihood of achieving goals, and potential problems during recovery for the procedure that I have provided informed consent. Date  Time:  01/15/2020 11:25 AM  Pre-Procedure Preparation:  Monitoring: As per clinic protocol. Respiration, ETCO2, SpO2, BP, heart rate and rhythm monitor placed and checked for adequate function Safety Precautions: Patient was assessed for positional comfort and pressure points before starting the procedure. Time-out: I initiated and conducted the "Time-out" before starting the procedure, as per protocol. The patient was asked to participate by confirming the accuracy of the "Time Out" information. Verification of the correct person, site, and procedure were performed and confirmed by me, the nursing staff, and the patient. "Time-out" conducted as per Joint Commission's Universal Protocol (UP.01.01.01). Time: 1146  Description of Procedure:          Position: Supine Target Area: Central-port of intrathecal pump. Approach: Anterior, 90 degree angle approach. Area Prepped: Entire Area around the pump implant. DuraPrep (Iodine Povacrylex [0.7% available iodine] and Isopropyl Alcohol, 74% w/w) Safety Precautions: Aspiration looking for blood return was conducted prior to all injections. At no point did we inject any substances, as a needle was being advanced. No attempts were made at seeking any paresthesias. Safe injection practices and needle disposal techniques used. Medications properly checked for expiration dates. SDV (single dose vial) medications  used. Description of the Procedure: Protocol guidelines were followed. Two nurses trained to do implant refills were present during the entire procedure. The refill medication was checked by both healthcare providers as well as the patient. The patient was included in the "Time-out" to verify the medication. The patient was placed in position. The pump was identified. The area was prepped in the usual manner. The sterile template was positioned over the pump, making sure the side-port location matched that of the pump. Both, the pump and the template were held for stability. The needle provided in the Medtronic Kit was then introduced thru the center of the template and into the central port. The pump content was aspirated and discarded volume documented. The new medication was slowly infused into the pump, thru the filter, making sure to avoid overpressure of the device. The needle was then removed and the area cleansed, making sure to leave some of the prepping solution back to take advantage of its long term bactericidal properties. The pump was interrogated and programmed to reflect the correct medication, volume, and dosage. The program was printed and taken to the physician for approval. Once checked and signed by the physician, a copy was provided to the patient and another scanned into the EMR. Vitals:   01/15/20 1122  BP: 132/87  Pulse: 98  Resp: 16  Temp: (!) 97.3 F (36.3 C)  SpO2: 98%  Weight: 163 lb (73.9 kg)  Height: '5\' 7"'  (1.702 m)    Start Time: 1146 hrs. End Time: 1210 hrs. Materials & Medications: Medtronic Refill Kit Medication(s): Please see chart orders for details.  Imaging Guidance:          Type of Imaging Technique: None used Indication(s): N/A Exposure Time: No patient exposure Contrast: None used. Fluoroscopic Guidance: N/A Ultrasound Guidance: N/A Interpretation: N/A  Antibiotic Prophylaxis:   Anti-infectives (From admission, onward)   None      Indication(s): None identified  Post-operative Assessment:  Post-procedure Vital Signs:  Pulse/HCG Rate: 98  Temp: (!) 97.3 F (36.3 C) Resp: 16 BP: 132/87 SpO2: 98 %  EBL: None  Complications: No immediate post-treatment complications observed by team, or reported by patient.  Note: The patient tolerated the entire procedure well. A repeat set of vitals were taken after the procedure and the patient was kept  under observation following institutional policy, for this type of procedure. Post-procedural neurological assessment was performed, showing return to baseline, prior to discharge. The patient was provided with post-procedure discharge instructions, including a section on how to identify potential problems. Should any problems arise concerning this procedure, the patient was given instructions to immediately contact us, at any time, without hesitation. In any case, we plan to contact the patient by telephone for a follow-up status report regarding this interventional procedure.  Comments:  No additional relevant information.  Plan of Care  Orders:  Orders Placed This Encounter  Procedures  . PUMP REFILL    Maintain Protocol by having two(2) healthcare providers during procedure and programming.    Scheduling Instructions:     Please refill intrathecal pump today.    Order Specific Question:   Where will this procedure be performed?    Answer:   [facility]  . PUMP REFILL    Whenever possible schedule on a procedure today.    Standing Status:   Future    Standing Expiration Date:   06/13/2020    Scheduling Instructions:     Please schedule intrathecal pump refill based on pump programming. Avoid schedule intervals of more than 120 days (4 months).    Order Specific Question:   Where will this procedure be performed?    Answer:   [facility]  . Informed Consent Details: Physician/Practitioner Attestation; Transcribe to consent form and obtain patient  signature    Provider Attestation: I, Franklin Dossie Arbour, MD, (Pain Management Specialist), the physician/practitioner, attest that I have discussed with the patient the benefits, risks, side effects, alternatives, likelihood of achieving goals and potential problems during recovery for the procedure that I have provided informed consent.    Scheduling Instructions:     Procedure: Intrathecal Pump Refill     Attending Physician: Beatriz Chancellor A. Dossie Arbour, MD     Indications: Chronic Pain Syndrome (G89.4)     Transcribe to consent form and obtain patient signature.   Chronic Opioid Analgesic:  Intrathecal PF-Fentanyl 502.2 mcg/day (20.9 mcg/hr) MME/day: 50.16 mg/day.   Medications ordered for procedure: No orders of the defined types were placed in this encounter.  Medications administered: Lexine Baton "Rick" had no medications administered during this visit.  See the medical record for exact dosing, route, and time of administration.  Follow-up plan:   Return for Pump Refill (Max:40mo.       Interventional management options:  Considering:   Palliative intrathecal pump refills  No other procedures currently under consideration   Palliative PRN treatment(s):   Palliative management of intrathecal pump       Recent Visits No visits were found meeting these conditions.  Showing recent visits within past 90 days and meeting all other requirements   Today's Visits Date Type Provider Dept  01/15/20 Procedure visit NMilinda Pointer MD [facility]  Showing today's visits and meeting all other requirements   Future Appointments No visits were found meeting these conditions.  Showing future appointments within next 90 days and meeting all other requirements   Disposition: Discharge home  Discharge (Date  Time): 01/15/2020; 1215 hrs.   Primary Care Physician: FSofie Hartigan MD [facility] Note by: FGaspar Cola  MD Date: 01/15/2020; Time: 12:45 PM  Disclaimer:  Medicine is not an eChief Strategy Officer The only guarantee in medicine is that nothing is guaranteed. It is important to note that the decision to proceed with this intervention  was based on the information collected from the patient. The Data and conclusions were drawn from the patient's questionnaire, the interview, and the physical examination. Because the information was provided in large part by the patient, it cannot be guaranteed that it has not been purposely or unconsciously manipulated. Every effort has been made to obtain as much relevant data as possible for this evaluation. It is important to note that the conclusions that lead to this procedure are derived in large part from the available data. Always take into account that the treatment will also be dependent on availability of resources and existing treatment guidelines, considered by other Pain Management Practitioners as being common knowledge and practice, at the time of the intervention. For Medico-Legal purposes, it is also important to point out that variation in procedural techniques and pharmacological choices are the acceptable norm. The indications, contraindications, technique, and results of the above procedure should only be interpreted and judged by a Board-Certified Interventional Pain Specialist with extensive familiarity and expertise in the same exact procedure and technique.

## 2020-01-15 NOTE — Progress Notes (Signed)
Safety precautions to be maintained throughout the outpatient stay will include: orient to surroundings, keep bed in low position, maintain call bell within reach at all times, provide assistance with transfer out of bed and ambulation.  

## 2020-01-16 ENCOUNTER — Telehealth: Payer: Self-pay | Admitting: *Deleted

## 2020-01-16 NOTE — Telephone Encounter (Signed)
No problems post pump refill.

## 2020-01-20 ENCOUNTER — Encounter: Payer: Medicare Other | Admitting: Pain Medicine

## 2020-01-29 NOTE — Progress Notes (Signed)

## 2020-02-03 ENCOUNTER — Telehealth: Payer: Self-pay

## 2020-02-03 NOTE — Progress Notes (Signed)
[facility]  [address] [place], [place] [address] Phone: [phone]  Fax: [phone]   Clinic Day:  02/05/2020  Referring physician: Sofie Hartigan, MD  Chief Complaint: Logan Perez is a 74 y.o. 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   HPI: The patient was last seen in the medical oncology clinic on 12/11/2019. At that time, he was doing well. He had a good month. He denied any bone pain or infections. Exam was stable. Patient received cycle #38 daratumumab.   Hematocrit was 37.3, hemoglobin 12.7, MCV 102.8, platelets 197,000, WBC 7,100. Sodium was 133, creatinine 1.40, total protein 6.4. Vitamin B12 was 402 with folate 58.8. TSH was 4.542 with a free T4 of 0.99.  M spike was 0 gm/dL.  Lambda free light chains were 3.3 with ratio of 2.00.    Patient received cycle #39 daratumumab on 01/08/2020. Labs showed hematocrit 36.6, hemoglobin 12.7, MCV 102.5, platelets 201,000, WBC 7,900. Creatinine 1.59, calcium 8.7, total protein 6.3. M spike was 0.3 gm/dL. Magnesium 1.9.   Patient saw Dr. Ellison Hughs on 12/23/2019. He had a hard mobile half grape size swelling cyst-like area on the lateral aspect of the right hip. The cyst felt like it was attached to the sub-Q tissue. The cyst was more mobile with the hip flexed. Patient denied any tenderness to the touch but noted feeling discomfort if he lays on a hard surface on the right hip. The lump had not changed in size, texture, or pain. Patient referred to general surgery.   Patient had an initial consult with Dr. Lysle Pearl on 12/31/2019. Patient had localized swelling, mass and lump. Pain was described as dull and achy with pressure, alleviated by nothing specific. Physical exam was not consistent with typical lipoma or even epidermal cysts. Recommended further imaging studies just to ensure there was  no involvement with the underlying bone. Patient decided to continue observation for now. Dr. Lysle Pearl agreed given stability of cyst over past couple years. Patient will follow up as needed.   Symptomatically, he has been doing "alright". The cyst on his right hip is stable with no pain or discomfort. Pain only increases when he lays on a hard surface. The patient notes that he is not as active as he wants to be. With the nice weather, he will increase daily activity. He denies dizziness or shortness of breath when going up and down the steps multiple times a day.  He is having 2-3 loose bowel movements a day. He is taking imodium. He has neuropathy in his feet and restless legs. He denies any bone or joint pain. He remains on Eliquis without any episodes of bleeding. He is taking potassium 20 meq a day. Patient reports that he took his pre-meds this morning.    Past Medical History:  Diagnosis Date  . Anxiety   . Atrial fibrillation ([place])   . BPH (benign prostatic hyperplasia)   . Complication of anesthesia    BAD HEADACHE NIGHT OF FIRST CATARACT  . Compression fracture of lumbar vertebra ([place])   . Difficulty voiding   . Dysrhythmia    A FIB  . Elevated PSA   . GERD (gastroesophageal reflux disease)   . Hearing aid worn    bilateral  . History of kidney stones   . HLD (hyperlipidemia)   . HOH (hard of hearing)   . Hypertension   .  Multiple myeloma ([place])   . Neuropathy    feet. 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  . Pain    BACK  . Palpitations   . Pneumonia   . Pulmonary embolism ([place])   . Sepsis ([place])   . Stroke [facility])    TIA, detected on CT scan. pt was unaware    Past Surgical History:  Procedure Laterality Date  . BACK SURGERY  1994  . CATARACT EXTRACTION W/PHACO Right 05/02/2016   Procedure: CATARACT EXTRACTION PHACO AND INTRAOCULAR LENS PLACEMENT (IOC);  Surgeon: Birder Robson, MD;  Location: [facility];  Service: Ophthalmology;  Laterality: Right;  [place] 1.06 AP% 20.6 CDE 13.70 FLUID  PACK LOT # [id number]  . CATARACT EXTRACTION W/PHACO Left 05/16/2016   Procedure: CATARACT EXTRACTION PHACO AND INTRAOCULAR LENS PLACEMENT (IOC);  Surgeon: Birder Robson, MD;  Location: [facility];  Service: Ophthalmology;  Laterality: Left;  [place] 01:43 AP% 19.8 CDE 20.45 FLUID PACK LOT #[id number] H  . COLONOSCOPY WITH PROPOFOL N/A 03/22/2017   Procedure: COLONOSCOPY WITH PROPOFOL;  Surgeon: Lucilla Lame, MD;  Location: [place];  Service: Endoscopy;  Laterality: N/A;  has port  . ESOPHAGOGASTRODUODENOSCOPY (EGD) WITH PROPOFOL  03/22/2017   Procedure: ESOPHAGOGASTRODUODENOSCOPY (EGD) WITH PROPOFOL;  Surgeon: Lucilla Lame, MD;  Location: [place];  Service: Endoscopy;;  . EYE SURGERY    . KNEE ARTHROSCOPY Left 1992  . LIMBAL STEM CELL TRANSPLANT    . PAIN PUMP IMPLANTATION  2012  . PAIN PUMP IMPLANTATION N/A 09/04/2017   Procedure: INTRATHECAL PUMP BATTERY CHANGE;  Surgeon: Milinda Pointer, MD;  Location: [facility];  Service: Neurosurgery;  Laterality: N/A;  . PORTA CATH INSERTION N/A 12/04/2016   Procedure: Glori Luis Cath Insertion;  Surgeon: Algernon Huxley, MD;  Location: [place] [facility];  Service: Cardiovascular;  Laterality: N/A;  . stem cell implant  2008   [facility]    Family History  Problem Relation Age of Onset  . Cancer Father        throat  . Kidney disease Sister   . Stroke Other   . Stroke Mother   . Bladder Cancer Neg Hx   . Prostate cancer Neg Hx   . Kidney cancer Neg Hx     Social History:  reports that he quit smoking about 44 years ago. His smoking use included cigarettes. He has a 30.00 pack-year smoking history. He has never used smokeless tobacco. He reports that he does not drink alcohol or use drugs. He has a wire haired dachshund. He lives in [place]. His wife's name is [name]. The patient is alone today.  Allergies:  Allergies  Allergen Reactions  . Azithromycin Diarrhea and Other (See Comments)    Possible cause of C. Diff Possible cause of C.  Diff Possible cause of C. Diff Possible cause of C. Diff  . Bee Pollen Other (See Comments)    Sneezing, watery eyes, runny nose  . Pollen Extract Other (See Comments)    Sneezing, watery eyes, runny nose  . 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 Rivaroxaban Rash    Current Medications: Current Outpatient Medications  Medication Sig Dispense Refill  . acetaminophen (TYLENOL) 500 MG tablet Take 500 mg by mouth 2 (two) times a day.    . baclofen (LIORESAL) 10 MG tablet Take 1 tablet (10 mg total) by mouth 2 (two) times daily. 180 tablet 3  . Calcium Carb-Cholecalciferol (CALCIUM-VITAMIN D) 500-400 MG-UNIT TABS Take  1 tablet daily by mouth.    . cetirizine (ZYRTEC) 10 MG tablet Take 10 mg by mouth daily as needed for allergies.     . Cholecalciferol (VITAMIN D3) 2000 units capsule Take 2,000 Units daily by mouth.     . Daratumumab (DARZALEX IV) Inject 1,200 mg every 30 (thirty) days into the vein.     [name] dexamethasone (DECADRON) 4 MG tablet TAKE 4 TABLETS BY MOUTH 1 HOUR PRIOR TO INFUSION, THEN 1 TABLET ON DAYS 2 AND 3 AS DIRECTED 30 tablet 0  . diltiazem (CARDIZEM CD) 120 MG 24 hr capsule Take 120 mg daily by mouth.     . diphenhydrAMINE (BENADRYL) 25 MG tablet Take 25 mg by mouth as directed. With chemo treatment    . ELIQUIS 5 MG TABS tablet TAKE 1 TABLET(5 MG) BY MOUTH TWICE DAILY 60 tablet 0  . fluticasone (FLONASE) 50 MCG/ACT nasal spray Place 1 spray into the nose daily as needed for allergies.     . Hypromellose (ARTIFICIAL TEARS OP) Place 1 drop as needed into both eyes (for dry eyes).    [name] loperamide (IMODIUM) 2 MG capsule Take 4 mg as needed by mouth for diarrhea or loose stools.     [name] loratadine (CLARITIN) 10 MG tablet Take 10 mg by mouth daily as needed.     . lovastatin (MEVACOR) 20 MG tablet Take 20 mg by mouth every evening.     . montelukast (SINGULAIR) 10 MG tablet TAKE 1 TABLET BY MOUTH DAILY THE DAY BEFORE THE  INFUSION, THEN DAY OF, AND THE 2 DAYS AFTER INFUSION 30 tablet 0  . Multiple Vitamins-Iron (MULTIVITAMIN/IRON PO) Take 1 tablet daily by mouth.    . NON FORMULARY IT pump  Fentanyl 1,500.0 mcg/ml Bupivicaine 30.0 mg/ml Clonidine 300.0 mcg/ml Rate 502.2 mcg/day    . omeprazole (PRILOSEC) 20 MG capsule Take 20 mg by mouth daily.     . ondansetron (ZOFRAN) 8 MG tablet Take 1 tablet (8 mg total) by mouth as directed. Take with chemo and can take it as needed 20 tablet 1  . potassium chloride SA (KLOR-CON) 20 MEQ tablet TAKE 1 TABLET(20 MEQ) BY MOUTH DAILY 90 tablet 0  . tamsulosin (FLOMAX) 0.4 MG CAPS capsule TAKE 1 CAPSULE(0.4 MG) BY MOUTH DAILY 90 capsule 4  . valACYclovir (VALTREX) 500 MG tablet TAKE 1 TABLET BY MOUTH EVERY DAY 30 tablet 3  . vitamin B-12 (CYANOCOBALAMIN) 1000 MCG tablet Take 1,000 mcg daily by mouth.     . bismuth subsalicylate (PEPTO BISMOL) 262 MG/15ML suspension Take 30 mLs by mouth every 6 (six) hours as needed for diarrhea or loose stools.    [name] diltiazem (CARDIZEM) 60 MG tablet Take 60 mg daily as needed by mouth (for increased heart rate > 140).     [name] PAIN MANAGEMENT IT PUMP REFILL 1 each by Intrathecal route once for 1 dose. Medication: PF Fentanyl 1,500.0 mcg/ml PF Bupivicaine 30.0 mg/ml PF Clonidine 300.0 mcg/ml Total Volume: 40 ml Needed by 01-15-2020 @ 1000 1 each 0   No current facility-administered medications for this visit.   Facility-Administered Medications Ordered in Other Visits  Medication Dose Route Frequency Provider Last Rate Last Admin  . heparin lock flush 100 unit/mL  500 Units Intravenous Once Diamonique Ruedas C, MD      . sodium chloride flush (NS) 0.9 % injection 10 mL  10 mL Intravenous PRN Lequita Asal, MD   10 mL at 02/05/20 1610    Review of Systems  Constitutional: Negative.  Negative for chills, diaphoresis, fever, malaise/fatigue and weight loss (stable).       Feels "alright; business as usual".  Semi-active.  HENT: Positive  for hearing loss (hearing aid). Negative for congestion, ear pain, nosebleeds, sinus pain, sore throat and tinnitus.   Eyes: Negative.  Negative for blurred vision, double vision, photophobia and pain.  Respiratory: Negative.  Negative for cough, hemoptysis, sputum production, shortness of breath and wheezing.   Cardiovascular: Negative for chest pain, palpitations, orthopnea, leg swelling and PND.       H/o atrial fibrillation.  Gastrointestinal: Negative for abdominal pain, blood in stool, constipation, diarrhea (on Imodium), heartburn, melena, nausea (using dramamine; in the morning) and vomiting.       Normal Bowel. Drinking non-dairy Boost.  Genitourinary: Negative.  Negative for dysuria, frequency, hematuria and urgency.  Musculoskeletal: Negative for back pain (chronic; Fentanyl/bupivacaine/clonidine pump; better with tylenol), falls, joint pain, myalgias and neck pain.  Skin: Negative.  Negative for itching and rash.       Right hip cyst is stable.  Neurological: Positive for sensory change (restless legs; neuropathy in feet). Negative for dizziness, tremors, speech change, focal weakness, seizures, weakness and headaches.  Endo/Heme/Allergies: Negative.  Negative for environmental allergies. Does not bruise/bleed easily.  Psychiatric/Behavioral: Negative.  Negative for depression and memory loss. The patient is not nervous/anxious and does not have insomnia.   All other systems reviewed and are negative.  Performance status (ECOG): 1  Vitals Blood pressure 99/62, pulse 82, temperature 97.9 F (36.6 C), temperature source Tympanic, resp. rate 18, height '5\' 7"'  (1.702 m), weight 168 lb 14 oz (76.6 kg), SpO2 99 %.   Physical Exam  Constitutional: He is oriented to person, place, and time. He appears well-developed and well-nourished. No distress.  HENT:  Head: Normocephalic and atraumatic.  Mouth/Throat: Oropharynx is clear and moist. No oropharyngeal exudate.  Short brown hair with  graying. Goatee.  Mask.   Eyes: Pupils are equal, round, and reactive to light. Conjunctivae and EOM are normal. No scleral icterus.  Glasses. Blue eyes.  Neck: No JVD present.  Cardiovascular: Normal rate, regular rhythm and normal heart sounds. Exam reveals no gallop.  No murmur heard. Pulmonary/Chest: Effort normal and breath sounds normal. No respiratory distress. He has no wheezes. He has no rales.  Abdominal: Soft. Bowel sounds are normal. He exhibits no distension and no mass. There is no abdominal tenderness. There is no rebound and no guarding.  Right sided implanted Fentanyl/bupivacaine/clonidine pump.   Musculoskeletal:        General: No tenderness or edema. Normal range of motion.     Cervical back: Normal range of motion and neck supple.  Lymphadenopathy:       Head (right side): No preauricular, no posterior auricular and no occipital adenopathy present.       Head (left side): No preauricular, no posterior auricular and no occipital adenopathy present.    He has no cervical adenopathy.    He has no axillary adenopathy.       Right: No inguinal and no supraclavicular adenopathy present.       Left: No inguinal and no supraclavicular adenopathy present.  Neurological: He is alert and oriented to person, place, and time.  Skin: Skin is warm and dry. No rash noted. He is not diaphoretic. No erythema. No pallor.  Psychiatric: He has a normal mood and affect. His behavior is normal. Judgment and thought content normal.  Nursing note and vitals reviewed.  Infusion on 02/05/2020  Component Date Value Ref Range Status  . WBC 02/05/2020 9.1  4.0 - 10.5 K/uL Final  . RBC 02/05/2020 3.59* 4.22 - 5.81 MIL/uL Final  . Hemoglobin 02/05/2020 12.6* 13.0 - 17.0 g/dL Final  . HCT 02/05/2020 36.8* 39.0 - 52.0 % Final  . MCV 02/05/2020 102.5* 80.0 - 100.0 fL Final  . MCH 02/05/2020 35.1* 26.0 - 34.0 pg Final  . MCHC 02/05/2020 34.2  30.0 - 36.0 g/dL Final  . RDW 02/05/2020 13.2  11.5 -  15.5 % Final  . Platelets 02/05/2020 207  150 - 400 K/uL Final  . nRBC 02/05/2020 0.0  0.0 - 0.2 % Final  . Neutrophils Relative % 02/05/2020 79  % Final  . Neutro Abs 02/05/2020 7.1  1.7 - 7.7 K/uL Final  . Lymphocytes Relative 02/05/2020 15  % Final  . Lymphs Abs 02/05/2020 1.4  0.7 - 4.0 K/uL Final  . Monocytes Relative 02/05/2020 5  % Final  . Monocytes Absolute 02/05/2020 0.4  0.1 - 1.0 K/uL Final  . Eosinophils Relative 02/05/2020 1  % Final  . Eosinophils Absolute 02/05/2020 0.1  0.0 - 0.5 K/uL Final  . Basophils Relative 02/05/2020 0  % Final  . Basophils Absolute 02/05/2020 0.0  0.0 - 0.1 K/uL Final  . Immature Granulocytes 02/05/2020 0  % Final  . Abs Immature Granulocytes 02/05/2020 0.03  0.00 - 0.07 K/uL Final   Performed at [facility], [address], [place], [place] [address]    Assessment:  Dredyn Gubbels is a 74 y.o. male with stage III mutiple myeloma. He initially presented with progressive back pain beginning in 12/2006. MRI revealed "spots and compression fractures". He began Velcade, thalidomide, and Decadron. In 08/2007, he underwent high dose chemotherapy and autologous stem cell transplant. 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  He recurred with a rising M-spike (2.7) with repeat M spike (1.7 gm/dl) in 03/2010. He was initially treated with Velcade (02/08/2010 - 05/10/2010). He then began Revlimid (15 mg 3 weeks on/1 week off) and Decadron (40 mg on day 1, 8, 15, 22). Because of significant side effect with Decadron his dose was decreased to 10 mg once a week in 07/2010.   He was on maintenance Revlimid. Revlimid was initially10 mg 3 weeks on/1 week off. This was changed to 10 mg 2 weeks on/2 weeks off secondary to right nipple tenderness. His dose was increased to 10 mg 3 weeks on/1 week off with Decadron 10 mg a week (on Sundays) and then Revlamid 15 mg 3 weeks on and 1 week off with Decadron on Sundays.  He began Pomalyst4 mg 3 weeks on/1 week off with Decadron on 08/27/2015.  SPEPrevealed no monoclonal protein (04/21/2015) and 0.5 gm/dL on 09/22/2015 and 10/20/2015. M spikewas 0.1 on 02/02/2016, 03/01/2016, 03/29/2016, 04/26/2016, and 05/24/2016. M spikewas 0 on 10/11/2016, 11/28/2016, 02/05/2017, 03/21/2017, 08/02/2017, 10/04/2017, 12/27/2017, 01/24/2018, and 02/21/2018. M spikewas 0.1 on 11/01/2017, 0.1 on 11/29/2017, 0 on 12/27/2017, 0 on 01/24/2018, 0 on 02/21/2018, 0.1 on 03/21/2018, 0.3 on 04/18/2018, 0.1 on 05/16/2018, 0 on 06/13/2018, 0.2 on 08/08/2018, 0 on 09/09/2018, 0.1 on 11/04/2018, 0 on 12/02/2018, 0.1 on 12/30/2018, 0 on 01/27/2019, 0.1 on 02/24/2019, 0.1 on 03/24/2019, 0.1 on 04/23/2019, 0 on 05/19/2019, 0.1 on 06/17/2019, 0.2 on 07/15/2019, 0.2 on 08/18/2019, 0.2 on 09/15/2019, 0.1 on 10/13/2019, 0.1 on 11/13/2019, 0 on 12/11/2019, 0.3 on 01/08/2020, and 0.2 on 02/05/2020.  Free light chainshave been monitored. Kappa free light  chainswere 18.54 on 11/21/2013, 18.37 on 02/20/2014, 18.93 on 05/22/2014, 32.58 (high; normal ratio 1.27) on 08/21/2014, 51.53 (high; elevated ratio of 2.12) on 11/13/2014, 28.08 (ratio 1.73) on 12/09/2014, 23.71 (ratio 2.17) on 01/18/2015, 92.93 (ratio 9.49) on 04/21/2015, 93.44 (ratio 10.28) on 05/26/2015, 255.45 (ratio of 24.05) on 07/14/2015, 373.89 (ratio 48.31) on 08/04/2015, 474.33 (ratio 70.58) on 08/25/2015, 450.76 (ratio 55.44) on 09/22/2015, 453.4 (ratio 59.89) on 10/20/2015, 58.26 (ratio of >40.74) on 02/02/2016, 62.67 (ratio of 37.98) on 03/01/2016, 75.7 (ratio >50.47) on 03/29/2016, 79.7 (ratio 53.13) on 04/26/2016, 108.6 (ratio >72.4) on 05/24/2016, 8.3 (1.46 ratio) on 10/11/2016, 6.7 (0.81 ratio) on 02/05/2017, 7.8 (1.01 ratio) on 03/21/2017,7.6 (ratio 0.63) on 06/01/2017, 8.1 (ratio 1.13) on 11/29/2017, 9.6 (ratio 1.55) on 06/13/2018, 6.6 (ratio 2.06) on 07/11/2018, 6.9 (ratio 0.82) on 08/08/2018, 5.8 (ratio 1.66) on 09/09/2018, 5.8  (ratio 1.05) on 11/04/2018, 5.6(ratio 2.55)on 12/02/2018, 5.5 (ratio 1.96) on 01/27/2019, 7.6 (ratio 2.38) on 06/17/2019, 6.9 (ratio 1.21) on 11/13/2019, 6.6 (ratio 2.0) on 12/11/2019, and 7.9 (ratio 2.14) on 02/05/2020.  Bone surveyon 12/08/2014 was stable. Bone survey on 10/21/2015 revealed increase conspicuity of subcentimeter lytic lesions in the calvarium.  Bone marrow aspirate and biopsyon 11/04/2015 revealed an atypical monoclonal plasma cells estimated at 30-40% of marrow cells. Marrow was variably cellular (approximately 45%) with background trilineage hematopoiesis. There was no significant increase in marrow reticulin fibers. Storage iron was present.   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 He develoed herpes zoster in 04/2008. He developed a pulmonary embolismin 05/2013. He was initially on Xarelto, but is now on Eliquis. He had an episode of pneumonia around this time requiring a brief admission. He developed severe lower leg cramps on 08/07/2014 secondary to hypokalemia. Duplex was negative.   He was treated forC difficile colitis(Flagyl completed 07/30/2015). He has a chronic indwelling pain pump.  He received 4 cycles of Pomalyst and Decadron(08/27/2015 - 11/19/2015). Restaging studies document progressive disease. Kappa free light chains are increasing. SPEP revealed 0.5 gm/dL monoclonal protein then 1.3 gm/dL. Bone survey reveals increase conspicuity of subcentimeter lytic lesions in the calvarium. Bone marrow reveals 30-40% plasma cells.   MUGAon 11/30/2015 revealed an ejection fraction of 46%. He is felt not to be a good candidate for Kyprolis. There were no focal wall motion abnormalities. He had a stress echo less than 1 year ago. He has a history of PVCs and atrial fibrillation. He takes Cardizem prn.  He received 17 weeks of daratumumab(Darzalex) (12/09/2015 - 05/25/2016). He  tolerated treatment well without side effect.  Bone marrow on 02/09/2016 revealed no diagnostic morphologic evidence of plasma cell myeloma. Marrow was normocellular to hypocellular marrow for age (ranging from 10-40%) with maturing trilineage hematopoiesis and mild multilineage dyspoiesis. There was patchy mild increase in reticulin. Storage iron was present. Flow cytometry revealed no definitive evidence of monoclonality. There was a non-specific atypical myeloid and monocytic findings with no increase in blasts. Cytogenetics were normal (46, XY).  He is currently25month s/p 2nd autologous stem cell transplanton 06/16/2016. 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 He also experienced atrial fibrillation with intermittent episodes of RVR requiring IV beta blockers. He is on prophylactic valacyclovir for 1 year post transplant.  He started vaccinations(DTaP-Pediatric triple vaccine, Hep B- Pediatrix triple vaccine, Haemophilus influenza B (Hib), inactivated polio virus (IPV), pneumococcal conjugate vaccine 13-valent (PCV 13)) on 04/17/2017. Recommendation was for Shingrix vaccine to be given in BTonasketand second dose of Shingrix  in 2 months. He had follow-up vaccinations on 08/28/2017. He had his second shingles vaccine. He received 18 month vaccinations- DTaP-Pediatric triple vaccine, Hep B- Pediatrix triple vaccine, Haemophilus influenza B (Hib), inactivated polio virus (IPV), pneumococcal conjugate vaccine 13-valent (PCV 13) on 02/05/2018.  He is s/p39cycles ofdaratumumab(Darzalex) post transplant(12/05/2016 -01/08/2020).  PET scanon 08/14/2018 revealed no evidence of active myeloma on whole-body FDG PET scan. There was no evidence soft tissue plasmacytoma. There was no clear evidence of lytic lesions on the CT portion exam. Some lucencies in the pelvis were stable. There were chronic compression fractures in the  lower thoracic spine.  Bone survey on 11/04/2019 revealed stable small lytic lesions noted throughout the skull.  There were no other lytic lesions. Exam was stable.   He has a history of hypomagnesemiathat required IV magnesium (2-4 gm) weekly. He has not required magnesium since 10/24/2016. Patient has atrial fibrillationand is on Eliquis.  Symptomatically, he denies any bone pain or infections.  Exam is stable.  Plan: 1.   Labs today: CBC with diff, CMP, Mg, SPEP, FLCA. 2.Multiple myeloma Clinically,he is doing well. M spike fluctuates between 0-0.2 gm/dL. M-spikeis  0.2 today. PET scan on 08/14/2018 revealed no evidence of active myeloma. Bone survey on 11/04/2019 revealed stable small lytic lesions in the skull.             No new lesions. Labs reviewed.  Cycle #40 daratumumab today.   Premedications were taken at home. Continue MD appointments with every other infusion. Continue prophylactic valacyclovir (until 3 months s/pdaratumumab). He is no longer followed in the transplant center. Periodic phone follow-up with Dr. Adriana Sim[facility] at [facility]. Discuss symptom management.  He has antiemetics and pain medications at home to use on a prn bases.  Interventions are adequate.   .       3. Macrocytic anemia Hematocrit36.8. Hemoglobin12.7. MCV102.5. B12 and folate were normal on 12/11/2019.  TSH was 4.542 (slightly elevated) with a normal free T4. Continue to monitor. 4.Neuropathy He has a stable grade 1 neuropathy. Continue to monitor. 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 He takes Imodium daily. Daratumumab side effects: 16% diarrhea GI notes diarrhea differential: NSAIDs, drug injury vs inflammatory bowel disease.             IBD is felt less likely. 6.History of pulmonary embolism(2014) Continue Eliquis. 7.Hypokalemia, resolved Potassium4.1. Continue  potassium supplementation. 8.  Cyst like area on right hip  Etiology unclear, but remains stable.  Patient declined imaging.  Patient followed by surgery.  Continue to monitor. 9.   RTC in 1 month for labs (CBC with diff, CMP, Mg, SPEP) and cycle #41 daratumumab. 10.   RTC in 2 months for MD assessment, labs (CBC with diff, CMP, Mg, SPEP, FLCA), and cycle #42 daratumumab.  I discussed the assessment and treatment plan with the patient.  The patient was provided an opportunity to ask questions and all were answered.  The patient agreed with the plan and demonstrated an understanding of the instructions.  The patient was advised to call back if the symptoms worsen or if the condition fails to improve as anticipated.   Lequita Asal, MD, PhD    02/05/2020, 10:01 AM  I, Selena Batten, am acting as scribe for [employer]. Mike Gip, MD, PhD.  I, Cy Bresee C. Mike Gip, MD, have reviewed the above documentation for accuracy and completeness, and I agree with the above. [name]

## 2020-02-03 NOTE — Telephone Encounter (Signed)
Spoke with the patient to confirm if he need a refill and how much Potassium was he taking. The patient confirm Potassium 20 meq 1 tab po daily. I have inform Dr Mike Gip he need a refill. The patient was understanding.

## 2020-02-04 ENCOUNTER — Other Ambulatory Visit: Payer: Self-pay

## 2020-02-04 NOTE — Progress Notes (Signed)
Patient confirmed name and DOB for phone assessment. He denies questions and concerns.

## 2020-02-05 ENCOUNTER — Inpatient Hospital Stay: Payer: Medicare Other

## 2020-02-05 ENCOUNTER — Inpatient Hospital Stay (HOSPITAL_BASED_OUTPATIENT_CLINIC_OR_DEPARTMENT_OTHER): Payer: Medicare Other | Admitting: Hematology and Oncology

## 2020-02-05 ENCOUNTER — Encounter: Payer: Self-pay | Admitting: Hematology and Oncology

## 2020-02-05 VITALS — BP 99/62 | HR 82 | Temp 97.9°F | Resp 18 | Ht 67.0 in | Wt 168.9 lb

## 2020-02-05 VITALS — BP 150/82 | HR 75 | Temp 97.2°F | Resp 16

## 2020-02-05 DIAGNOSIS — Z5112 Encounter for antineoplastic immunotherapy: Secondary | ICD-10-CM | POA: Diagnosis not present

## 2020-02-05 DIAGNOSIS — C9000 Multiple myeloma not having achieved remission: Secondary | ICD-10-CM

## 2020-02-05 DIAGNOSIS — Z9484 Stem cells transplant status: Secondary | ICD-10-CM | POA: Diagnosis not present

## 2020-02-05 DIAGNOSIS — G629 Polyneuropathy, unspecified: Secondary | ICD-10-CM

## 2020-02-05 DIAGNOSIS — Z7901 Long term (current) use of anticoagulants: Secondary | ICD-10-CM

## 2020-02-05 DIAGNOSIS — D539 Nutritional anemia, unspecified: Secondary | ICD-10-CM | POA: Diagnosis not present

## 2020-02-05 DIAGNOSIS — K529 Noninfective gastroenteritis and colitis, unspecified: Secondary | ICD-10-CM

## 2020-02-05 DIAGNOSIS — C9002 Multiple myeloma in relapse: Secondary | ICD-10-CM

## 2020-02-05 LAB — CBC WITH DIFFERENTIAL/PLATELET
Abs Immature Granulocytes: 0.03 10*3/uL (ref 0.00–0.07)
Basophils Absolute: 0 10*3/uL (ref 0.0–0.1)
Basophils Relative: 0 %
Eosinophils Absolute: 0.1 10*3/uL (ref 0.0–0.5)
Eosinophils Relative: 1 %
HCT: 36.8 % — ABNORMAL LOW (ref 39.0–52.0)
Hemoglobin: 12.6 g/dL — ABNORMAL LOW (ref 13.0–17.0)
Immature Granulocytes: 0 %
Lymphocytes Relative: 15 %
Lymphs Abs: 1.4 10*3/uL (ref 0.7–4.0)
MCH: 35.1 pg — ABNORMAL HIGH (ref 26.0–34.0)
MCHC: 34.2 g/dL (ref 30.0–36.0)
MCV: 102.5 fL — ABNORMAL HIGH (ref 80.0–100.0)
Monocytes Absolute: 0.4 10*3/uL (ref 0.1–1.0)
Monocytes Relative: 5 %
Neutro Abs: 7.1 10*3/uL (ref 1.7–7.7)
Neutrophils Relative %: 79 %
Platelets: 207 10*3/uL (ref 150–400)
RBC: 3.59 MIL/uL — ABNORMAL LOW (ref 4.22–5.81)
RDW: 13.2 % (ref 11.5–15.5)
WBC: 9.1 10*3/uL (ref 4.0–10.5)
nRBC: 0 % (ref 0.0–0.2)

## 2020-02-05 LAB — COMPREHENSIVE METABOLIC PANEL
ALT: 15 U/L (ref 0–44)
AST: 20 U/L (ref 15–41)
Albumin: 4.2 g/dL (ref 3.5–5.0)
Alkaline Phosphatase: 42 U/L (ref 38–126)
Anion gap: 9 (ref 5–15)
BUN: 23 mg/dL (ref 8–23)
CO2: 22 mmol/L (ref 22–32)
Calcium: 8.9 mg/dL (ref 8.9–10.3)
Chloride: 104 mmol/L (ref 98–111)
Creatinine, Ser: 1.48 mg/dL — ABNORMAL HIGH (ref 0.61–1.24)
GFR calc Af Amer: 54 mL/min — ABNORMAL LOW (ref >60)
GFR calc non Af Amer: 46 mL/min — ABNORMAL LOW (ref >60)
Glucose, Bld: 140 mg/dL — ABNORMAL HIGH (ref 70–99)
Potassium: 4.1 mmol/L (ref 3.5–5.1)
Sodium: 135 mmol/L (ref 135–145)
Total Bilirubin: 0.6 mg/dL (ref 0.3–1.2)
Total Protein: 6.4 g/dL — ABNORMAL LOW (ref 6.5–8.1)

## 2020-02-05 LAB — MAGNESIUM: Magnesium: 2 mg/dL (ref 1.7–2.4)

## 2020-02-05 MED ORDER — SODIUM CHLORIDE 0.9% FLUSH
10.0000 mL | INTRAVENOUS | Status: DC | PRN
  Administered 2020-02-05: 10 mL via INTRAVENOUS
  Filled 2020-02-05: qty 10

## 2020-02-05 MED ORDER — HEPARIN SOD (PORK) LOCK FLUSH 100 UNIT/ML IV SOLN
500.0000 [IU] | Freq: Once | INTRAVENOUS | Status: AC
  Administered 2020-02-05: 500 [IU] via INTRAVENOUS
  Filled 2020-02-05: qty 5

## 2020-02-05 MED ORDER — SODIUM CHLORIDE 0.9 % IV SOLN
Freq: Once | INTRAVENOUS | Status: AC
  Filled 2020-02-05: qty 250

## 2020-02-05 MED ORDER — SODIUM CHLORIDE 0.9 % IV SOLN
16.0000 mg/kg | Freq: Once | INTRAVENOUS | Status: AC
  Administered 2020-02-05: 1200 mg via INTRAVENOUS
  Filled 2020-02-05: qty 60

## 2020-02-05 NOTE — Progress Notes (Signed)
Patient took all of his premedications at 9am this morning.

## 2020-02-06 LAB — KAPPA/LAMBDA LIGHT CHAINS
Kappa free light chain: 7.9 mg/L (ref 3.3–19.4)
Kappa, lambda light chain ratio: 2.14 — ABNORMAL HIGH (ref 0.26–1.65)
Lambda free light chains: 3.7 mg/L — ABNORMAL LOW (ref 5.7–26.3)

## 2020-02-09 LAB — PROTEIN ELECTROPHORESIS, SERUM
A/G Ratio: 1.6 (ref 0.7–1.7)
Albumin ELP: 3.9 g/dL (ref 2.9–4.4)
Alpha-1-Globulin: 0.2 g/dL (ref 0.0–0.4)
Alpha-2-Globulin: 0.8 g/dL (ref 0.4–1.0)
Beta Globulin: 1 g/dL (ref 0.7–1.3)
Gamma Globulin: 0.5 g/dL (ref 0.4–1.8)
Globulin, Total: 2.5 g/dL (ref 2.2–3.9)
M-Spike, %: 0.2 g/dL — ABNORMAL HIGH
Total Protein ELP: 6.4 g/dL (ref 6.0–8.5)

## 2020-02-11 ENCOUNTER — Telehealth: Payer: Self-pay | Admitting: *Deleted

## 2020-02-26 NOTE — Progress Notes (Signed)
Pharmacist Chemotherapy Monitoring - Follow Up Assessment    I verify that I have reviewed each item in the below checklist:  . Regimen for the patient is scheduled for the appropriate day and plan matches scheduled date. [name] Appropriate non-routine labs are ordered dependent on drug ordered. . If applicable, additional medications reviewed and ordered per protocol based on lifetime cumulative doses and/or treatment regimen.   Plan for follow-up and/or issues identified: No . I-vent associated with next due treatment: No . MD and/or nursing notified: No  Freedom Peddy K 02/26/2020 3:40 PM

## 2020-03-02 NOTE — Progress Notes (Signed)
[facility]  [address], [address] [place], [place] [address] Phone: [phone]  Fax: [phone]   Clinic Day:  03/04/2020  Referring physician: Sofie Hartigan, MD  Chief Complaint: Logan Perez is a 74 y.o. 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   HPI: The patient was last seen in the medical oncology clinic on 02/05/2020. At that time, he denied any bone pain or infections. Exam was stable. Hematocrit was 36.8, hemoglobin 12.6, MCV 102.5, platelets 207,000, WBC 9,100. Creatinine 1.48.  M spike was 0.2 gm/dL. Lambda free light chains were 3.7 with a ratio of 2.14.  He received cycle #41 daratumumab.   Symptomatically, the patient is doing alright today. His neuropathy is the same. He notes restless legs. He denies any fevers, cough or shortness of breath. He reports the Baclofen is causing him to have nightmares again. He would like to take half a Baclofen to see if this would resolve symptoms/side effects.  Patient took all pre meds required for treatment today.   His pulse was 104 but when rechecked is came back down to 89.    Past Medical History:  Diagnosis Date  . Anxiety   . Atrial fibrillation ([place])   . BPH (benign prostatic hyperplasia)   . Complication of anesthesia    BAD HEADACHE NIGHT OF FIRST CATARACT  . Compression fracture of lumbar vertebra ([place])   . Difficulty voiding   . Dysrhythmia    A FIB  . Elevated PSA   . GERD (gastroesophageal reflux disease)   . Hearing aid worn    bilateral  . History of kidney stones   . HLD (hyperlipidemia)   . HOH (hard of hearing)   . Hypertension   . Multiple myeloma ([place])   . Neuropathy    feet. 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  . Pain    BACK  . Palpitations   . Pneumonia   . Pulmonary embolism ([place])   . Sepsis ([place])   . Stroke [facility])    TIA, detected on CT scan. pt was unaware    Past  Surgical History:  Procedure Laterality Date  . BACK SURGERY  1994  . CATARACT EXTRACTION W/PHACO Right 05/02/2016   Procedure: CATARACT EXTRACTION PHACO AND INTRAOCULAR LENS PLACEMENT (IOC);  Surgeon: Birder Robson, MD;  Location: [facility] ORS;  Service: Ophthalmology;  Laterality: Right;  [place] 1.06 AP% 20.6 CDE 13.70 FLUID PACK LOT # [id number]  . CATARACT EXTRACTION W/PHACO Left 05/16/2016   Procedure: CATARACT EXTRACTION PHACO AND INTRAOCULAR LENS PLACEMENT (IOC);  Surgeon: Birder Robson, MD;  Location: [facility] ORS;  Service: Ophthalmology;  Laterality: Left;  [place] 01:43 AP% 19.8 CDE 20.45 FLUID PACK LOT #[id number]  . COLONOSCOPY WITH PROPOFOL N/A 03/22/2017   Procedure: COLONOSCOPY WITH PROPOFOL;  Surgeon: Lucilla Lame, MD;  Location: [place];  Service: Endoscopy;  Laterality: N/A;  has port  . ESOPHAGOGASTRODUODENOSCOPY (EGD) WITH PROPOFOL  03/22/2017   Procedure: ESOPHAGOGASTRODUODENOSCOPY (EGD) WITH PROPOFOL;  Surgeon: Lucilla Lame, MD;  Location: [place];  Service: Endoscopy;;  . EYE SURGERY    . KNEE ARTHROSCOPY Left 1992  . LIMBAL STEM CELL TRANSPLANT    . PAIN PUMP IMPLANTATION  2012  . PAIN PUMP IMPLANTATION N/A 09/04/2017   Procedure: INTRATHECAL PUMP BATTERY CHANGE;  Surgeon: Milinda Pointer, MD;  Location: [facility] OR[facility];  Service: Neurosurgery;  Laterality: N/A;  . PORTA CATH INSERTION N/A 12/04/2016   Procedure:  Porta Cath Insertion;  Surgeon: Algernon Huxley, MD;  Location: [place] [facility];  Service: Cardiovascular;  Laterality: N/A;  . stem cell implant  2008   [facility]    Family History  Problem Relation Age of Onset  . Cancer Father        throat  . Kidney disease Sister   . Stroke Other   . Stroke Mother   . Bladder Cancer Neg Hx   . Prostate cancer Neg Hx   . Kidney cancer Neg Hx     Social History:  reports that he quit smoking about 44 years ago. His smoking use included cigarettes. He has a 30.00 pack-year smoking history. He has never used  smokeless tobacco. He reports that he does not drink alcohol or use drugs. He has a wire haired dachshund. He lives in [place]. His wife's name is [name]. The patient is alone today.  Allergies:  Allergies  Allergen Reactions  . Azithromycin Diarrhea and Other (See Comments)    Possible cause of C. Diff Possible cause of C. Diff Possible cause of C. Diff Possible cause of C. Diff  . Bee Pollen Other (See Comments)    Sneezing, watery eyes, runny nose  . Pollen Extract Other (See Comments)    Sneezing, watery eyes, runny nose  . 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 Rivaroxaban Rash    Current Medications: Current Outpatient Medications  Medication Sig Dispense Refill  . acetaminophen (TYLENOL) 500 MG tablet Take 500 mg by mouth 2 (two) times a day.    . baclofen (LIORESAL) 10 MG tablet Take 1 tablet (10 mg total) by mouth 2 (two) times daily. 180 tablet 3  . bismuth subsalicylate (PEPTO BISMOL) 262 MG/15ML suspension Take 30 mLs by mouth every 6 (six) hours as needed for diarrhea or loose stools.    . Calcium Carb-Cholecalciferol (CALCIUM-VITAMIN D) 500-400 MG-UNIT TABS Take 1 tablet daily by mouth.    . cetirizine (ZYRTEC) 10 MG tablet Take 10 mg by mouth daily as needed for allergies.     . Cholecalciferol (VITAMIN D3) 2000 units capsule Take 2,000 Units daily by mouth.     . Daratumumab (DARZALEX IV) Inject 1,200 mg every 30 (thirty) days into the vein.     [name] dexamethasone (DECADRON) 4 MG tablet TAKE 4 TABLETS BY MOUTH 1 HOUR PRIOR TO INFUSION, THEN 1 TABLET ON DAYS 2 AND 3 AS DIRECTED 30 tablet 0  . diltiazem (CARDIZEM CD) 120 MG 24 hr capsule Take 120 mg daily by mouth.     . diltiazem (CARDIZEM) 60 MG tablet Take 60 mg daily as needed by mouth (for increased heart rate > 140).     [name] diphenhydrAMINE (BENADRYL) 25 MG tablet Take 25 mg by mouth as directed. With chemo treatment    . ELIQUIS 5 MG TABS tablet TAKE 1  TABLET(5 MG) BY MOUTH TWICE DAILY 60 tablet 0  . fluticasone (FLONASE) 50 MCG/ACT nasal spray Place 1 spray into the nose daily as needed for allergies.     . Hypromellose (ARTIFICIAL TEARS OP) Place 1 drop as needed into both eyes (for dry eyes).    [name] loperamide (IMODIUM) 2 MG capsule Take 4 mg as needed by mouth for diarrhea or loose stools.     [name] loratadine (CLARITIN) 10 MG tablet Take 10 mg by mouth daily as needed.     . lovastatin (MEVACOR) 20 MG tablet  Take 20 mg by mouth every evening.     . montelukast (SINGULAIR) 10 MG tablet TAKE 1 TABLET BY MOUTH DAILY THE DAY BEFORE THE INFUSION, THEN DAY OF, AND THE 2 DAYS AFTER INFUSION 30 tablet 0  . Multiple Vitamins-Iron (MULTIVITAMIN/IRON PO) Take 1 tablet daily by mouth.    . NON FORMULARY IT pump  Fentanyl 1,500.0 mcg/ml Bupivicaine 30.0 mg/ml Clonidine 300.0 mcg/ml Rate 502.2 mcg/day    . omeprazole (PRILOSEC) 20 MG capsule Take 20 mg by mouth daily.     . ondansetron (ZOFRAN) 8 MG tablet Take 1 tablet (8 mg total) by mouth as directed. Take with chemo and can take it as needed 20 tablet 1  . potassium chloride SA (KLOR-CON) 20 MEQ tablet TAKE 1 TABLET(20 MEQ) BY MOUTH DAILY 90 tablet 0  . tamsulosin (FLOMAX) 0.4 MG CAPS capsule TAKE 1 CAPSULE(0.4 MG) BY MOUTH DAILY 90 capsule 4  . valACYclovir (VALTREX) 500 MG tablet TAKE 1 TABLET BY MOUTH EVERY DAY 30 tablet 3  . vitamin B-12 (CYANOCOBALAMIN) 1000 MCG tablet Take 1,000 mcg daily by mouth.     [name] PAIN MANAGEMENT IT PUMP REFILL 1 each by Intrathecal route once for 1 dose. Medication: PF Fentanyl 1,500.0 mcg/ml PF Bupivicaine 30.0 mg/ml PF Clonidine 300.0 mcg/ml Total Volume: 40 ml Needed by 01-15-2020 @ 1000 1 each 0   No current facility-administered medications for this visit.    Review of Systems  Constitutional: Positive for weight loss (2 lbs). Negative for chills, diaphoresis, fever and malaise/fatigue.       Doing alright.  HENT: Positive for hearing loss (hearing aid).  Negative for congestion, ear pain, nosebleeds, sinus pain, sore throat and tinnitus.   Eyes: Negative.  Negative for blurred vision, double vision, photophobia and pain.  Respiratory: Negative.  Negative for cough, hemoptysis, sputum production, shortness of breath and wheezing.   Cardiovascular: Negative for chest pain, palpitations, orthopnea, leg swelling and PND.       H/o atrial fibrillation.  Gastrointestinal: Negative for abdominal pain, blood in stool, constipation, diarrhea (on Imodium), heartburn, melena, nausea (using dramamine; in the morning) and vomiting.       Normal Bowel. Drinking non-dairy Boost.  Genitourinary: Negative.  Negative for dysuria, frequency, hematuria and urgency.  Musculoskeletal: Negative for back pain (chronic; Fentanyl/bupivacaine/clonidine pump; better with tylenol), falls, joint pain, myalgias and neck pain.  Skin: Negative.  Negative for itching and rash.       Right hip cyst is stable.  Neurological: Positive for sensory change (restless legs; neuropathy in feet). Negative for dizziness, tremors, speech change, focal weakness, seizures, weakness and headaches.  Endo/Heme/Allergies: Negative.  Negative for environmental allergies. Does not bruise/bleed easily.  Psychiatric/Behavioral: Negative.  Negative for depression and memory loss. The patient is not nervous/anxious and does not have insomnia.   All other systems reviewed and are negative.  Performance status (ECOG): 1  Vitals Blood pressure 105/64, pulse (!) 104, temperature (!) 96.8 F (36 C), temperature source Tympanic, resp. rate 16, weight 166 lb 14.2 oz (75.7 kg), SpO2 96 %.   Physical Exam  Constitutional: He is oriented to person, place, and time. He appears well-developed and well-nourished. No distress.  HENT:  Head: Normocephalic and atraumatic.  Mouth/Throat: Oropharynx is clear and moist. No oropharyngeal exudate.  Short brown hair with graying. Goatee.  Mask.   Eyes: Pupils are  equal, round, and reactive to light. Conjunctivae and EOM are normal. No scleral icterus.  Glasses. Blue eyes.  Neck: No JVD  present.  Cardiovascular: Normal rate, regular rhythm and normal heart sounds. Exam reveals no gallop.  No murmur heard. Pulmonary/Chest: Effort normal and breath sounds normal. No respiratory distress. He has no wheezes. He has no rales.  Abdominal: Soft. Bowel sounds are normal. He exhibits no distension and no mass. There is no abdominal tenderness. There is no rebound and no guarding.  Right sided implanted Fentanyl/bupivacaine/clonidine pump.   Musculoskeletal:        General: No tenderness or edema. Normal range of motion.     Cervical back: Normal range of motion and neck supple.  Lymphadenopathy:       Head (right side): No preauricular, no posterior auricular and no occipital adenopathy present.       Head (left side): No preauricular, no posterior auricular and no occipital adenopathy present.    He has no cervical adenopathy.    He has no axillary adenopathy.       Right: No supraclavicular adenopathy present.       Left: No supraclavicular adenopathy present.  Neurological: He is alert and oriented to person, place, and time.  Skin: Skin is warm and dry. No rash noted. He is not diaphoretic. No erythema. No pallor.  Psychiatric: He has a normal mood and affect. His behavior is normal. Judgment and thought content normal.  Nursing note and vitals reviewed.   Infusion on 03/04/2020  Component Date Value Ref Range Status  . Magnesium 03/04/2020 1.9  1.7 - 2.4 mg/dL Final   Performed at [facility], [address], [place], [place] [address]  . Sodium 03/04/2020 136  135 - 145 mmol/L Final  . Potassium 03/04/2020 4.1  3.5 - 5.1 mmol/L Final  . Chloride 03/04/2020 105  98 - 111 mmol/L Final  . CO2 03/04/2020 24  22 - 32 mmol/L Final  . Glucose, Bld 03/04/2020 147* 70 - 99 mg/dL Final   Glucose reference range applies only to samples taken  after fasting for at least 8 hours.  . BUN 03/04/2020 22  8 - 23 mg/dL Final  . Creatinine, Ser 03/04/2020 1.53* 0.61 - 1.24 mg/dL Final  . Calcium 03/04/2020 9.1  8.9 - 10.3 mg/dL Final  . Total Protein 03/04/2020 6.6  6.5 - 8.1 g/dL Final  . Albumin 03/04/2020 4.2  3.5 - 5.0 g/dL Final  . AST 03/04/2020 20  15 - 41 U/L Final  . ALT 03/04/2020 16  0 - 44 U/L Final  . Alkaline Phosphatase 03/04/2020 40  38 - 126 U/L Final  . Total Bilirubin 03/04/2020 0.7  0.3 - 1.2 mg/dL Final  . GFR calc non Af Amer 03/04/2020 44* >60 mL/min Final  . GFR calc Af Amer 03/04/2020 52* >60 mL/min Final  . Anion gap 03/04/2020 7  5 - 15 Final   Performed at [facility], [address], [place],  [address]  . WBC 03/04/2020 8.5  4.0 - 10.5 K/uL Final  . RBC 03/04/2020 3.72* 4.22 - 5.81 MIL/uL Final  . Hemoglobin 03/04/2020 12.9* 13.0 - 17.0 g/dL Final  . HCT 03/04/2020 38.0* 39.0 - 52.0 % Final  . MCV 03/04/2020 102.2* 80.0 - 100.0 fL Final  . MCH 03/04/2020 34.7* 26.0 - 34.0 pg Final  . MCHC 03/04/2020 33.9  30.0 - 36.0 g/dL Final  . RDW 03/04/2020 13.2  11.5 - 15.5 % Final  . Platelets 03/04/2020 219  150 - 400 K/uL Final  . nRBC 03/04/2020 0.0  0.0 - 0.2 % Final  .  Neutrophils Relative % 03/04/2020 68  % Final  . Neutro Abs 03/04/2020 5.7  1.7 - 7.7 K/uL Final  . Lymphocytes Relative 03/04/2020 22  % Final  . Lymphs Abs 03/04/2020 1.9  0.7 - 4.0 K/uL Final  . Monocytes Relative 03/04/2020 8  % Final  . Monocytes Absolute 03/04/2020 0.7  0.1 - 1.0 K/uL Final  . Eosinophils Relative 03/04/2020 2  % Final  . Eosinophils Absolute 03/04/2020 0.2  0.0 - 0.5 K/uL Final  . Basophils Relative 03/04/2020 0  % Final  . Basophils Absolute 03/04/2020 0.0  0.0 - 0.1 K/uL Final  . Immature Granulocytes 03/04/2020 0  % Final  . Abs Immature Granulocytes 03/04/2020 0.03  0.00 - 0.07 K/uL Final   Performed at [facility], [address] [place], [place] [address]    Assessment:   Rodricus Candelaria is a 74 y.o. male with stage III mutiple myeloma. He initially presented with progressive back pain beginning in 12/2006. MRI revealed "spots and compression fractures". He began Velcade, thalidomide, and Decadron. In 08/2007, he underwent high dose chemotherapy and autologous stem cell transplant. 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  He recurred with a rising M-spike (2.7) with repeat M spike (1.7 gm/dl) in 03/2010. He was initially treated with Velcade (02/08/2010 - 05/10/2010). He then began Revlimid (15 mg 3 weeks on/1 week off) and Decadron (40 mg on day 1, 8, 15, 22). Because of significant side effect with Decadron his dose was decreased to 10 mg once a week in 07/2010.   He was on maintenance Revlimid. Revlimid was initially10 mg 3 weeks on/1 week off. This was changed to 10 mg 2 weeks on/2 weeks off secondary to right nipple tenderness. His dose was increased to 10 mg 3 weeks on/1 week off with Decadron 10 mg a week (on Sundays) and then Revlamid 15 mg 3 weeks on and 1 week off with Decadron on Sundays. He began Pomalyst4 mg 3 weeks on/1 week off with Decadron on 08/27/2015.  SPEPrevealed no monoclonal protein (04/21/2015) and 0.5 gm/dL on 09/22/2015 and 10/20/2015. M spikewas 0.1 on 02/02/2016, 03/01/2016, 03/29/2016, 04/26/2016, and 05/24/2016. M spikewas 0 on 10/11/2016, 11/28/2016, 02/05/2017, 03/21/2017, 08/02/2017, 10/04/2017, 12/27/2017, 01/24/2018, and 02/21/2018. M spikewas 0.1 on 11/01/2017, 0.1 on 11/29/2017, 0 on 12/27/2017, 0 on 01/24/2018, 0 on 02/21/2018, 0.1 on 03/21/2018, 0.3 on 04/18/2018, 0.1 on 05/16/2018, 0 on 06/13/2018, 0.2 on 08/08/2018, 0 on 09/09/2018, 0.1 on 11/04/2018, 0 on 12/02/2018, 0.1 on 12/30/2018, 0 on 01/27/2019, 0.1 on 02/24/2019, 0.1 on 03/24/2019, 0.1 on 04/23/2019, 0 on 05/19/2019, 0.1 on 06/17/2019, 0.2 on 07/15/2019, 0.2 on 08/18/2019, 0.2 on 09/15/2019, 0.1 on 10/13/2019, 0.1 on  11/13/2019, 0 on 12/11/2019, 0.3 on 01/08/2020, 0.2 on 02/05/2020, and 0.2 on 03/04/2020.  Free light chainshave been monitored. Kappa free light chainswere 18.54 on 11/21/2013, 18.37 on 02/20/2014, 18.93 on 05/22/2014, 32.58 (high; normal ratio 1.27) on 08/21/2014, 51.53 (high; elevated ratio of 2.12) on 11/13/2014, 28.08 (ratio 1.73) on 12/09/2014, 23.71 (ratio 2.17) on 01/18/2015, 92.93 (ratio 9.49) on 04/21/2015, 93.44 (ratio 10.28) on 05/26/2015, 255.45 (ratio of 24.05) on 07/14/2015, 373.89 (ratio 48.31) on 08/04/2015, 474.33 (ratio 70.58) on 08/25/2015, 450.76 (ratio 55.44) on 09/22/2015, 453.4 (ratio 59.89) on 10/20/2015, 58.26 (ratio of >40.74) on 02/02/2016, 62.67 (ratio of 37.98) on 03/01/2016, 75.7 (ratio >50.47) on 03/29/2016, 79.7 (ratio 53.13) on 04/26/2016, 108.6 (ratio >72.4) on 05/24/2016, 8.3 (1.46 ratio) on 10/11/2016, 6.7 (0.81 ratio) on 02/05/2017, 7.8 (1.01 ratio) on 03/21/2017,7.6 (ratio  0.63) on 06/01/2017, 8.1 (ratio 1.13) on 11/29/2017, 9.6 (ratio 1.55) on 06/13/2018, 6.6 (ratio 2.06) on 07/11/2018, 6.9 (ratio 0.82) on 08/08/2018, 5.8 (ratio 1.66) on 09/09/2018, 5.8 (ratio 1.05) on 11/04/2018, 5.6(ratio 2.55)on 12/02/2018, 5.5 (ratio 1.96) on 01/27/2019, 7.6 (ratio 2.38) on 06/17/2019, 6.9 (ratio 1.21) on 11/13/2019, 6.6 (ratio 2.0) on 12/11/2019, and 7.9 (ratio 2.14) on 02/05/2020.  Bone surveyon 12/08/2014 was stable. Bone survey on 10/21/2015 revealed increase conspicuity of subcentimeter lytic lesions in the calvarium.  Bone marrow aspirate and biopsyon 11/04/2015 revealed an atypical monoclonal plasma cells estimated at 30-40% of marrow cells. Marrow was variably cellular (approximately 45%) with background trilineage hematopoiesis. There was no significant increase in marrow reticulin fibers. Storage iron was present.   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 He develoed herpes zoster in 04/2008.  He developed a pulmonary embolismin 05/2013. He was initially on Xarelto, but is now on Eliquis. He had an episode of pneumonia around this time requiring a brief admission. He developed severe lower leg cramps on 08/07/2014 secondary to hypokalemia. Duplex was negative.   He was treated forC difficile colitis(Flagyl completed 07/30/2015). He has a chronic indwelling pain pump.  He received 4 cycles of Pomalyst and Decadron(08/27/2015 - 11/19/2015). Restaging studies document progressive disease. Kappa free light chains are increasing. SPEP revealed 0.5 gm/dL monoclonal protein then 1.3 gm/dL. Bone survey reveals increase conspicuity of subcentimeter lytic lesions in the calvarium. Bone marrow reveals 30-40% plasma cells.   MUGAon 11/30/2015 revealed an ejection fraction of 46%. He is felt not to be a good candidate for Kyprolis. There were no focal wall motion abnormalities. He had a stress echo less than 1 year ago. He has a history of PVCs and atrial fibrillation. He takes Cardizem prn.  He received 17 weeks of daratumumab(Darzalex) (12/09/2015 - 05/25/2016). He tolerated treatment well without side effect.  Bone marrow on 02/09/2016 revealed no diagnostic morphologic evidence of plasma cell myeloma. Marrow was normocellular to hypocellular marrow for age (ranging from 10-40%) with maturing trilineage hematopoiesis and mild multilineage dyspoiesis. There was patchy mild increase in reticulin. Storage iron was present. Flow cytometry revealed no definitive evidence of monoclonality. There was a non-specific atypical myeloid and monocytic findings with no increase in blasts. Cytogenetics were normal (46, XY).  He is currently87month s/p 2nd autologous stem cell transplanton 06/16/2016. 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 He also experienced atrial fibrillation with intermittent episodes of RVR requiring IV  beta blockers. He is on prophylactic valacyclovir for 1 year post transplant.  He started vaccinations(DTaP-Pediatric triple vaccine, Hep B- Pediatrix triple vaccine, Haemophilus influenza B (Hib), inactivated polio virus (IPV), pneumococcal conjugate vaccine 13-valent (PCV 13)) on 04/17/2017. Recommendation was for [place]rix vaccine [place] Sh[place]rix in 2 months. He had follow-up vaccinations on 08/28/2017. He had his second shingles vaccine. He received 18 month vaccinations- DTaP-Pediatric triple vaccine, Hep B- Pediatrix triple vaccine, Haemophilus influenza B (Hib), inactivated polio virus (IPV), pneumococcal conjugate vaccine 13-valent (PCV 13) on 02/05/2018.  He is s/p41cycles ofdaratumumab(Darzalex) post transplant(12/05/2016 -02/05/2020).  PET scanon 08/14/2018 revealed no evidence of active myeloma on whole-body FDG PET scan. There was no evidence soft tissue plasmacytoma. There was no clear evidence of lytic lesions on the CT portion exam. Some lucencies in the pelvis were stable. There were chronic compression fractures in the lower thoracic spine.  Bone survey on 11/04/2019 revealed stable small lytic lesions  noted throughout the skull.  There were no other lytic lesions. Exam was stable.   He has a history of hypomagnesemiathat required IV magnesium (2-4 gm) weekly. He has not required magnesium since 10/24/2016. Patient has atrial fibrillationand is on Eliquis.  Symptomatically, he is doing well.  He denies any bone pain or interval infections.  Exam is stable.  Plan: 1.   Labs today: CBC with diff, CMP, Mg, SPEP. 2.Multiple myeloma Clinically,he is doing well. M spike fluctuates between 0-0.2 gm/dL. M-spikeis 0.2 gm/dL today. PET scan on 08/14/2018 revealed no evidence of active myeloma. Bone survey on 11/04/2019 revealed stable small lytic lesions in the skull.             No new lesions. Labs reviewed.  Cycle #41  daratumumab today.     He took his premedications at home.   Review plan for MD appointments and every other visit. Continue prophylactic valacyclovir (until 3 months s/pdaratumumab). He is no longer followed in the transplant center. Periodic phone follow-up with Dr. Adriana Simas at [facility]. Discuss symptom management.  He has antiemetics and pain medications at home to use on a prn bases.  Interventions are adequate.         3. Macrocytic anemia Hematocrit38.0. Hemoglobin5.9. MCV102.2. B12 and folate were normal on 12/11/2019.  TSH was 4.542 (slightly elevated) with a normal free T4. Continue to monitor. 4.Neuropathy He has a stable grade 1 neuropathy. 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 He takes Imodium daily. Daratumumab side effects: 16% diarrhea GI notes diarrhea differential: NSAIDs, drug injury vs inflammatory bowel disease.             IBD is felt less likely. Symptomatically, he notes no change in his stool. 6.History of pulmonary embolism(2014) Continue Eliquis.  Denies any excess bruising or bleeding 7.   RTC in 1 month for labs (CBC with diff, CMP, Mg, SPEP) and cycle #43 daratumumab. 8.   RTC in 2 months for MD assessment, labs (CBC with diff, CMP, Mg, SPEP, FLCA) and cycle #44 daratumumab.  I discussed the assessment and treatment plan with the patient.  The patient was provided an opportunity to ask questions and all were answered.  The patient agreed with the plan and demonstrated an understanding of the instructions.  The patient was advised to call back if the symptoms worsen or if the condition fails to improve as anticipated.   Lequita Asal, MD, PhD    03/04/2020, 10:13 AM  I, Selena Batten, am acting as scribe for [employer]. Mike Gip, MD, PhD.  I, Sabiha Sura C. Mike Gip, MD, have reviewed the above documentation for accuracy and completeness, and I  agree with the above. [name]

## 2020-03-03 ENCOUNTER — Encounter: Payer: Self-pay | Admitting: Hematology and Oncology

## 2020-03-03 NOTE — Progress Notes (Signed)
Patient was called for pre assessment. Denies any pain or concerns at this time. States he is in need of a refill on montelukast. He would like to discuss with provider. No other concerns reported at this time.

## 2020-03-04 ENCOUNTER — Inpatient Hospital Stay: Payer: Medicare Other | Attending: Hematology and Oncology

## 2020-03-04 ENCOUNTER — Inpatient Hospital Stay: Payer: Medicare Other

## 2020-03-04 ENCOUNTER — Inpatient Hospital Stay (HOSPITAL_BASED_OUTPATIENT_CLINIC_OR_DEPARTMENT_OTHER): Payer: Medicare Other | Admitting: Hematology and Oncology

## 2020-03-04 ENCOUNTER — Other Ambulatory Visit: Payer: Self-pay

## 2020-03-04 VITALS — BP 105/64 | HR 104 | Temp 96.8°F | Resp 16 | Wt 166.9 lb

## 2020-03-04 VITALS — BP 130/75 | HR 84 | Temp 96.8°F | Resp 18

## 2020-03-04 DIAGNOSIS — K529 Noninfective gastroenteritis and colitis, unspecified: Secondary | ICD-10-CM

## 2020-03-04 DIAGNOSIS — Z5112 Encounter for antineoplastic immunotherapy: Secondary | ICD-10-CM | POA: Diagnosis present

## 2020-03-04 DIAGNOSIS — Z9484 Stem cells transplant status: Secondary | ICD-10-CM

## 2020-03-04 DIAGNOSIS — G629 Polyneuropathy, unspecified: Secondary | ICD-10-CM

## 2020-03-04 DIAGNOSIS — C9000 Multiple myeloma not having achieved remission: Secondary | ICD-10-CM

## 2020-03-04 DIAGNOSIS — D539 Nutritional anemia, unspecified: Secondary | ICD-10-CM | POA: Diagnosis not present

## 2020-03-04 DIAGNOSIS — Z79899 Other long term (current) drug therapy: Secondary | ICD-10-CM | POA: Diagnosis not present

## 2020-03-04 DIAGNOSIS — C9001 Multiple myeloma in remission: Secondary | ICD-10-CM

## 2020-03-04 DIAGNOSIS — C9002 Multiple myeloma in relapse: Secondary | ICD-10-CM

## 2020-03-04 LAB — CBC WITH DIFFERENTIAL/PLATELET
Abs Immature Granulocytes: 0.03 10*3/uL (ref 0.00–0.07)
Basophils Absolute: 0 10*3/uL (ref 0.0–0.1)
Basophils Relative: 0 %
Eosinophils Absolute: 0.2 10*3/uL (ref 0.0–0.5)
Eosinophils Relative: 2 %
HCT: 38 % — ABNORMAL LOW (ref 39.0–52.0)
Hemoglobin: 12.9 g/dL — ABNORMAL LOW (ref 13.0–17.0)
Immature Granulocytes: 0 %
Lymphocytes Relative: 22 %
Lymphs Abs: 1.9 10*3/uL (ref 0.7–4.0)
MCH: 34.7 pg — ABNORMAL HIGH (ref 26.0–34.0)
MCHC: 33.9 g/dL (ref 30.0–36.0)
MCV: 102.2 fL — ABNORMAL HIGH (ref 80.0–100.0)
Monocytes Absolute: 0.7 10*3/uL (ref 0.1–1.0)
Monocytes Relative: 8 %
Neutro Abs: 5.7 10*3/uL (ref 1.7–7.7)
Neutrophils Relative %: 68 %
Platelets: 219 10*3/uL (ref 150–400)
RBC: 3.72 MIL/uL — ABNORMAL LOW (ref 4.22–5.81)
RDW: 13.2 % (ref 11.5–15.5)
WBC: 8.5 10*3/uL (ref 4.0–10.5)
nRBC: 0 % (ref 0.0–0.2)

## 2020-03-04 LAB — COMPREHENSIVE METABOLIC PANEL
ALT: 16 U/L (ref 0–44)
AST: 20 U/L (ref 15–41)
Albumin: 4.2 g/dL (ref 3.5–5.0)
Alkaline Phosphatase: 40 U/L (ref 38–126)
Anion gap: 7 (ref 5–15)
BUN: 22 mg/dL (ref 8–23)
CO2: 24 mmol/L (ref 22–32)
Calcium: 9.1 mg/dL (ref 8.9–10.3)
Chloride: 105 mmol/L (ref 98–111)
Creatinine, Ser: 1.53 mg/dL — ABNORMAL HIGH (ref 0.61–1.24)
GFR calc Af Amer: 52 mL/min — ABNORMAL LOW (ref >60)
GFR calc non Af Amer: 44 mL/min — ABNORMAL LOW (ref >60)
Glucose, Bld: 147 mg/dL — ABNORMAL HIGH (ref 70–99)
Potassium: 4.1 mmol/L (ref 3.5–5.1)
Sodium: 136 mmol/L (ref 135–145)
Total Bilirubin: 0.7 mg/dL (ref 0.3–1.2)
Total Protein: 6.6 g/dL (ref 6.5–8.1)

## 2020-03-04 LAB — MAGNESIUM: Magnesium: 1.9 mg/dL (ref 1.7–2.4)

## 2020-03-04 MED ORDER — SODIUM CHLORIDE 0.9 % IV SOLN
Freq: Once | INTRAVENOUS | Status: AC
  Filled 2020-03-04: qty 250

## 2020-03-04 MED ORDER — SODIUM CHLORIDE 0.9 % IV SOLN
16.0000 mg/kg | Freq: Once | INTRAVENOUS | Status: AC
  Administered 2020-03-04: 1200 mg via INTRAVENOUS
  Filled 2020-03-04: qty 60

## 2020-03-04 MED ORDER — MONTELUKAST SODIUM 10 MG PO TABS: ORAL_TABLET | ORAL | 0 refills | Status: DC

## 2020-03-04 MED ORDER — HEPARIN SOD (PORK) LOCK FLUSH 100 UNIT/ML IV SOLN
500.0000 [IU] | Freq: Once | INTRAVENOUS | Status: AC | PRN
  Administered 2020-03-04: 500 [IU]
  Filled 2020-03-04: qty 5

## 2020-03-05 LAB — PROTEIN ELECTROPHORESIS, SERUM
A/G Ratio: 1.5 (ref 0.7–1.7)
Albumin ELP: 3.7 g/dL (ref 2.9–4.4)
Alpha-1-Globulin: 0.1 g/dL (ref 0.0–0.4)
Alpha-2-Globulin: 0.9 g/dL (ref 0.4–1.0)
Beta Globulin: 1 g/dL (ref 0.7–1.3)
Gamma Globulin: 0.6 g/dL (ref 0.4–1.8)
Globulin, Total: 2.5 g/dL (ref 2.2–3.9)
M-Spike, %: 0.2 g/dL — ABNORMAL HIGH
Total Protein ELP: 6.2 g/dL (ref 6.0–8.5)

## 2020-03-27 ENCOUNTER — Other Ambulatory Visit: Payer: Self-pay | Admitting: Hematology and Oncology

## 2020-03-27 DIAGNOSIS — C9001 Multiple myeloma in remission: Secondary | ICD-10-CM

## 2020-03-31 ENCOUNTER — Other Ambulatory Visit: Payer: Self-pay

## 2020-03-31 MED ORDER — PAIN MANAGEMENT IT PUMP REFILL: 1.0000 | Freq: Once | INTRATHECAL | 0 refills | Status: AC

## 2020-04-01 ENCOUNTER — Inpatient Hospital Stay: Payer: Medicare Other

## 2020-04-01 ENCOUNTER — Other Ambulatory Visit: Payer: Self-pay | Admitting: Hematology and Oncology

## 2020-04-01 ENCOUNTER — Ambulatory Visit: Payer: Medicare Other

## 2020-04-01 ENCOUNTER — Other Ambulatory Visit: Payer: Self-pay

## 2020-04-01 ENCOUNTER — Inpatient Hospital Stay: Payer: Medicare Other | Attending: Hematology and Oncology

## 2020-04-01 ENCOUNTER — Ambulatory Visit: Payer: Medicare Other | Admitting: Hematology and Oncology

## 2020-04-01 VITALS — BP 129/77 | HR 71 | Temp 97.7°F | Resp 18

## 2020-04-01 DIAGNOSIS — C9000 Multiple myeloma not having achieved remission: Secondary | ICD-10-CM

## 2020-04-01 DIAGNOSIS — Z79899 Other long term (current) drug therapy: Secondary | ICD-10-CM | POA: Insufficient documentation

## 2020-04-01 DIAGNOSIS — C9002 Multiple myeloma in relapse: Secondary | ICD-10-CM

## 2020-04-01 DIAGNOSIS — Z5112 Encounter for antineoplastic immunotherapy: Secondary | ICD-10-CM | POA: Insufficient documentation

## 2020-04-01 DIAGNOSIS — C9001 Multiple myeloma in remission: Secondary | ICD-10-CM

## 2020-04-01 LAB — CBC WITH DIFFERENTIAL/PLATELET
Abs Immature Granulocytes: 0.03 10*3/uL (ref 0.00–0.07)
Basophils Absolute: 0 10*3/uL (ref 0.0–0.1)
Basophils Relative: 0 %
Eosinophils Absolute: 0.1 10*3/uL (ref 0.0–0.5)
Eosinophils Relative: 1 %
HCT: 35.4 % — ABNORMAL LOW (ref 39.0–52.0)
Hemoglobin: 12.3 g/dL — ABNORMAL LOW (ref 13.0–17.0)
Immature Granulocytes: 0 %
Lymphocytes Relative: 15 %
Lymphs Abs: 1.1 10*3/uL (ref 0.7–4.0)
MCH: 35.2 pg — ABNORMAL HIGH (ref 26.0–34.0)
MCHC: 34.7 g/dL (ref 30.0–36.0)
MCV: 101.4 fL — ABNORMAL HIGH (ref 80.0–100.0)
Monocytes Absolute: 0.4 10*3/uL (ref 0.1–1.0)
Monocytes Relative: 5 %
Neutro Abs: 5.7 10*3/uL (ref 1.7–7.7)
Neutrophils Relative %: 79 %
Platelets: 201 10*3/uL (ref 150–400)
RBC: 3.49 MIL/uL — ABNORMAL LOW (ref 4.22–5.81)
RDW: 13.6 % (ref 11.5–15.5)
WBC: 7.3 10*3/uL (ref 4.0–10.5)
nRBC: 0 % (ref 0.0–0.2)

## 2020-04-01 LAB — COMPREHENSIVE METABOLIC PANEL
ALT: 15 U/L (ref 0–44)
AST: 21 U/L (ref 15–41)
Albumin: 3.9 g/dL (ref 3.5–5.0)
Alkaline Phosphatase: 39 U/L (ref 38–126)
Anion gap: 9 (ref 5–15)
BUN: 19 mg/dL (ref 8–23)
CO2: 22 mmol/L (ref 22–32)
Calcium: 8.8 mg/dL — ABNORMAL LOW (ref 8.9–10.3)
Chloride: 105 mmol/L (ref 98–111)
Creatinine, Ser: 1.52 mg/dL — ABNORMAL HIGH (ref 0.61–1.24)
GFR calc Af Amer: 52 mL/min — ABNORMAL LOW (ref >60)
GFR calc non Af Amer: 44 mL/min — ABNORMAL LOW (ref >60)
Glucose, Bld: 103 mg/dL — ABNORMAL HIGH (ref 70–99)
Potassium: 4.3 mmol/L (ref 3.5–5.1)
Sodium: 136 mmol/L (ref 135–145)
Total Bilirubin: 0.6 mg/dL (ref 0.3–1.2)
Total Protein: 6.2 g/dL — ABNORMAL LOW (ref 6.5–8.1)

## 2020-04-01 LAB — MAGNESIUM: Magnesium: 1.8 mg/dL (ref 1.7–2.4)

## 2020-04-01 MED ORDER — SODIUM CHLORIDE 0.9 % IV SOLN
16.0000 mg/kg | Freq: Once | INTRAVENOUS | Status: AC
  Administered 2020-04-01: 1200 mg via INTRAVENOUS
  Filled 2020-04-01: qty 60

## 2020-04-01 MED ORDER — HEPARIN SOD (PORK) LOCK FLUSH 100 UNIT/ML IV SOLN
500.0000 [IU] | Freq: Once | INTRAVENOUS | Status: AC
  Administered 2020-04-01: 500 [IU] via INTRAVENOUS
  Filled 2020-04-01: qty 5

## 2020-04-01 MED ORDER — SODIUM CHLORIDE 0.9 % IV SOLN
Freq: Once | INTRAVENOUS | Status: AC
  Filled 2020-04-01: qty 250

## 2020-04-01 MED ORDER — SODIUM CHLORIDE 0.9% FLUSH
10.0000 mL | INTRAVENOUS | Status: DC | PRN
  Administered 2020-04-01: 10 mL via INTRAVENOUS
  Filled 2020-04-01: qty 10

## 2020-04-02 LAB — KAPPA/LAMBDA LIGHT CHAINS
Kappa free light chain: 6.6 mg/L (ref 3.3–19.4)
Kappa, lambda light chain ratio: 2.28 — ABNORMAL HIGH (ref 0.26–1.65)
Lambda free light chains: 2.9 mg/L — ABNORMAL LOW (ref 5.7–26.3)

## 2020-04-03 LAB — PROTEIN ELECTROPHORESIS, SERUM
A/G Ratio: 1.5 (ref 0.7–1.7)
Albumin ELP: 3.5 g/dL (ref 2.9–4.4)
Alpha-1-Globulin: 0.1 g/dL (ref 0.0–0.4)
Alpha-2-Globulin: 0.8 g/dL (ref 0.4–1.0)
Beta Globulin: 0.9 g/dL (ref 0.7–1.3)
Gamma Globulin: 0.4 g/dL (ref 0.4–1.8)
Globulin, Total: 2.3 g/dL (ref 2.2–3.9)
M-Spike, %: 0.2 g/dL — ABNORMAL HIGH
Total Protein ELP: 5.8 g/dL — ABNORMAL LOW (ref 6.0–8.5)

## 2020-04-29 ENCOUNTER — Encounter: Payer: Self-pay | Admitting: Pain Medicine

## 2020-04-29 ENCOUNTER — Ambulatory Visit: Payer: Medicare Other | Attending: Pain Medicine | Admitting: Pain Medicine

## 2020-04-29 ENCOUNTER — Other Ambulatory Visit: Payer: Self-pay

## 2020-04-29 VITALS — BP 112/62 | HR 106 | Temp 97.0°F | Resp 16 | Ht 67.0 in | Wt 165.0 lb

## 2020-04-29 DIAGNOSIS — C419 Malignant neoplasm of bone and articular cartilage, unspecified: Secondary | ICD-10-CM | POA: Diagnosis not present

## 2020-04-29 DIAGNOSIS — Z881 Allergy status to other antibiotic agents status: Secondary | ICD-10-CM | POA: Insufficient documentation

## 2020-04-29 DIAGNOSIS — Z95828 Presence of other vascular implants and grafts: Secondary | ICD-10-CM | POA: Diagnosis not present

## 2020-04-29 DIAGNOSIS — G894 Chronic pain syndrome: Secondary | ICD-10-CM | POA: Diagnosis not present

## 2020-04-29 DIAGNOSIS — Z978 Presence of other specified devices: Secondary | ICD-10-CM | POA: Diagnosis not present

## 2020-04-29 DIAGNOSIS — M5416 Radiculopathy, lumbar region: Secondary | ICD-10-CM | POA: Insufficient documentation

## 2020-04-29 DIAGNOSIS — Z451 Encounter for adjustment and management of infusion pump: Secondary | ICD-10-CM | POA: Diagnosis not present

## 2020-04-29 DIAGNOSIS — C9001 Multiple myeloma in remission: Secondary | ICD-10-CM | POA: Diagnosis not present

## 2020-04-29 DIAGNOSIS — G893 Neoplasm related pain (acute) (chronic): Secondary | ICD-10-CM | POA: Diagnosis not present

## 2020-04-29 DIAGNOSIS — G8929 Other chronic pain: Secondary | ICD-10-CM

## 2020-04-29 NOTE — Progress Notes (Signed)
Safety precautions to be maintained throughout the outpatient stay will include: orient to surroundings, keep bed in low position, maintain call bell within reach at all times, provide assistance with transfer out of bed and ambulation.  

## 2020-04-29 NOTE — Progress Notes (Signed)
PROVIDER NOTE: Information contained herein reflects review and annotations entered in association with encounter. Interpretation of such information and data should be left to medically-trained personnel. Information provided to patient can be located elsewhere in the medical record under "Patient Instructions". Document created using STT-dictation technology, any transcriptional errors that may result from process are unintentional.    Patient: Logan Perez  Service Category: Procedure  Provider: Gaspar Cola, MD  DOB: [birth year]  DOS: 04/29/2020  Location: [facility]  MRN: [id number]  Setting: Ambulatory - outpatient  Referring Provider: Sofie Hartigan, MD  Type: Established Patient  Specialty: Interventional Pain Management  PCP: Sofie Hartigan, MD   Primary Reason for Visit: Interventional Pain Management Treatment. CC: Back Pain (lower)  Procedure:          Intrathecal Drug Delivery System (IDDS):  Type: Reservoir Refill [phone] 5% rate increase Region: Abdominal Laterality: Right  Type of Pump: Medtronic Synchromed II Delivery Route: Intrathecal Type of Pain Treated: Neuropathic/Nociceptive Primary Medication Class: Opioid/opiate  Medication, Concentration, Infusion Program, & Delivery Rate: Please see scanned programming printout.   Indications: 1. Chronic pain syndrome   2. Malignant neoplasm of bone, unspecified location ([place])   3. Cancer associated pain   4. Chronic low back pain   5. Chronic lumbar radicular pain   6. Multiple myeloma in remission ([place])   7. Presence of implanted infusion pump (Medtronic, programmable, intrathecal pump)   8. Presence of intrathecal pump   9. Encounter for adjustment or management of infusion pump    Pain Assessment: Self-Reported Pain Score: 4 /10             Reported level is compatible with observation.        Today the patient requested a modest increase in his pump.  He indicates that lately  he is waking up in the morning with more pain.  Based on information that he provided me, it seems that it may be arthritic pain.  Because of this I looked at the possibility of using a nonsteroidal anti-inflammatory drug, but quickly realized that he is on a blood thinner (Eliquis).  Because of this, it is relatively contraindicated to add that nonsteroidal anti-inflammatory drug.  Because of this, I think that it is very appropriate to increase the intrathecal pump.  I offered initially to increase it to 10%, but he indicated that the last time that he had an increased, he experienced some numbness in the lower extremities and that why he wants to try a lower increase.  Today we will do a 5% increase and see how he does with that.  He was informed that should he have any type of problems with that increase, such as numbness or weakness, he can just give [place] a call and we will be more than glad to reprogram it to the lower infusion rate.  He understood and accepted.  Pharmacotherapy Assessment  Analgesic: Intrathecal PF-Fentanyl 502.2 mcg/day (20.9 mcg/hr) MME/day: 50.16 mg/day.   Monitoring: [place] PMP: PDMP not reviewed this encounter.       Pharmacotherapy: No side-effects or adverse reactions reported. Compliance: No problems identified. Effectiveness: Clinically acceptable. Plan: Refer to "POC".  UDS: No results found for: SUMMARY  Intrathecal Pump Therapy Assessment  Manufacturer: Medtronic Synchromed Type: Programmable Volume: 40 mL reservoir MRI compatibility: Yes   Drug content:  Primary Medication Class: Opioid Primary Medication:PF-Fentanyl(1500 mcg/mL) Secondary Medication:PF-Bupivacaine(30 mg/mL) Other Medication:PF-Clonidine(300 mcg/mL)   Programming:  Type: Simple continuous. See pump readout for  details.   Changes:  Medication Change: None at this point Rate Change: 5% increase  Reported side-effects or adverse reactions: None reported  Effectiveness:  Described as relatively effective, allowing for increase in activities of daily living (ADL) Clinically meaningful improvement in function (CMIF): Sustained CMIF goals met  Plan: Pump refill today w/ Analysis and programming  Pre-op Assessment:  Logan Perez is a 74 y.o. (year old), male patient, seen today for interventional treatment. He  has a past surgical history that includes Knee arthroscopy (Left, 1992); Pain pump implantation (2012); stem cell implant (2008); Cataract extraction w/PHACO (Right, 05/02/2016); Cataract extraction w/PHACO (Left, 05/16/2016); Limbal stem cell transplant; PORTA CATH INSERTION (N/A, 12/04/2016); Colonoscopy with propofol (N/A, 03/22/2017); Esophagogastroduodenoscopy (egd) with propofol (03/22/2017); Eye surgery; Back surgery (1994); and Pain pump implantation (N/A, 09/04/2017). Logan Perez has a current medication list which includes the following prescription(s): acetaminophen, baclofen, bismuth subsalicylate, calcium-vitamin d, calcium citrate-vitamin d, cetirizine, vitamin d3, daratumumab, dexamethasone, diltiazem, diltiazem, diphenhydramine, eliquis, fluticasone, carboxymethylcellulose sodium, loperamide, loratadine, lovastatin, montelukast, multiple vitamins-iron, NON FORMULARY, omeprazole, ondansetron, potassium chloride sa, tamsulosin, valacyclovir, vitamin b-12, and [DISCONTINUED] PAIN MANAGEMENT IT PUMP REFILL. His primarily concern today is the Back Pain (lower)  Initial Vital Signs:  Pulse/HCG Rate: (!) 106  Temp: (!) 97 F (36.1 C) Resp: 16 BP: 112/62 SpO2: 98 %  BMI: Estimated body mass index is 25.84 kg/m as calculated from the following:   Height as of this encounter: '5\' 7"'  (1.702 m).   Weight as of this encounter: 165 lb (74.8 kg).  Risk Assessment: Allergies: Reviewed. He is allergic to azithromycin, bee pollen, pollen extract, zoledronic acid, and rivaroxaban.  Allergy Precautions: None required Coagulopathies: Reviewed. None identified.   Blood-thinner therapy: None at this time Active Infection(s): Reviewed. None identified. Mr. Morgan is afebrile  Site Confirmation: Mr. Prabhakar was asked to confirm the procedure and laterality before marking the site Procedure checklist: Completed Consent: Before the procedure and under the influence of no sedative(s), amnesic(s), or anxiolytics, the patient was informed of the treatment options, risks and possible complications. To fulfill our ethical and legal obligations, as recommended by the American Medical Association's Code of Ethics, I have informed the patient of my clinical impression; the nature and purpose of the treatment or procedure; the risks, benefits, and possible complications of the intervention; the alternatives, including doing nothing; the risk(s) and benefit(s) of the alternative treatment(s) or procedure(s); and the risk(s) and benefit(s) of doing nothing.  Mr. Savo was provided with information about the general risks and possible complications associated with most interventional procedures. These include, but are not limited to: failure to achieve desired goals, infection, bleeding, organ or nerve damage, allergic reactions, paralysis, and/or death.  In addition, he was informed of those risks and possible complications associated to this particular procedure, which include, but are not limited to: damage to the implant; failure to decrease pain; local, systemic, or serious CNS infections, intraspinal abscess with possible cord compression and paralysis, or life-threatening such as meningitis; bleeding; organ damage; nerve injury or damage with subsequent sensory, motor, and/or autonomic system dysfunction, resulting in transient or permanent pain, numbness, and/or weakness of one or several areas of the body; allergic reactions, either minor or major life-threatening, such as anaphylactic or anaphylactoid reactions.  Furthermore, Mr. Shidler was informed of those risks  and complications associated with the medications. These include, but are not limited to: allergic reactions (i.e.: anaphylactic or anaphylactoid reactions); endorphine suppression; bradycardia and/or hypotension; water retention and/or peripheral vascular relaxation leading  to lower extremity edema and possible stasis ulcers; respiratory depression and/or shortness of breath; decreased metabolic rate leading to weight gain; swelling or edema; medication-induced neural toxicity; particulate matter embolism and blood vessel occlusion with resultant organ, and/or nervous system infarction; and/or intrathecal granuloma formation with possible spinal cord compression and permanent paralysis.  Before refilling the pump Mr. Costanzo was informed that some of the medications used in the devise may not be FDA approved for such use and therefore it constitutes an off-label use of the medications.  Finally, he was informed that Medicine is not an exact science; therefore, there is also the possibility of unforeseen or unpredictable risks and/or possible complications that may result in a catastrophic outcome. The patient indicated having understood very clearly. We have given the patient no guarantees and we have made no promises. Enough time was given to the patient to ask questions, all of which were answered to the patient's satisfaction. Mr. Hailes has indicated that he wanted to continue with the procedure. Attestation: I, the ordering provider, attest that I have discussed with the patient the benefits, risks, side-effects, alternatives, likelihood of achieving goals, and potential problems during recovery for the procedure that I have provided informed consent. Date  Time: 04/29/2020  9:51 AM  Pre-Procedure Preparation:  Monitoring: As per clinic protocol. Respiration, ETCO2, SpO2, BP, heart rate and rhythm monitor placed and checked for adequate function Safety Precautions: Patient was assessed for  positional comfort and pressure points before starting the procedure. Time-out: I initiated and conducted the "Time-out" before starting the procedure, as per protocol. The patient was asked to participate by confirming the accuracy of the "Time Out" information. Verification of the correct person, site, and procedure were performed and confirmed by me, the nursing staff, and the patient. "Time-out" conducted as per Joint Commission's Universal Protocol (UP.01.01.01). Time: 1027  Description of Procedure:          Position: Supine Target Area: Central-port of intrathecal pump. Approach: Anterior, 90 degree angle approach. Area Prepped: Entire Area around the pump implant. DuraPrep (Iodine Povacrylex [0.7% available iodine] and Isopropyl Alcohol, 74% w/w) Safety Precautions: Aspiration looking for blood return was conducted prior to all injections. At no point did we inject any substances, as a needle was being advanced. No attempts were made at seeking any paresthesias. Safe injection practices and needle disposal techniques used. Medications properly checked for expiration dates. SDV (single dose vial) medications used. Description of the Procedure: Protocol guidelines were followed. Two nurses trained to do implant refills were present during the entire procedure. The refill medication was checked by both healthcare providers as well as the patient. The patient was included in the "Time-out" to verify the medication. The patient was placed in position. The pump was identified. The area was prepped in the usual manner. The sterile template was positioned over the pump, making sure the side-port location matched that of the pump. Both, the pump and the template were held for stability. The needle provided in the Medtronic Kit was then introduced thru the center of the template and into the central port. The pump content was aspirated and discarded volume documented. The new medication was slowly infused  into the pump, thru the filter, making sure to avoid overpressure of the device. The needle was then removed and the area cleansed, making sure to leave some of the prepping solution back to take advantage of its long term bactericidal properties. The pump was interrogated and programmed to reflect the correct medication, volume,  and dosage. The program was printed and taken to the physician for approval. Once checked and signed by the physician, a copy was provided to the patient and another scanned into the EMR. Vitals:   04/29/20 0950  BP: 112/62  Pulse: (!) 106  Resp: 16  Temp: (!) 97 F (36.1 C)  TempSrc: Temporal  SpO2: 98%  Weight: 165 lb (74.8 kg)  Height: '5\' 7"'  (1.702 m)    Start Time: 1027 hrs. End Time:   hrs. Materials & Medications: Medtronic Refill Kit Medication(s): Please see chart orders for details.  Imaging Guidance:          Type of Imaging Technique: None used Indication(s): N/A Exposure Time: No patient exposure Contrast: None used. Fluoroscopic Guidance: N/A Ultrasound Guidance: N/A Interpretation: N/A  Antibiotic Prophylaxis:   Anti-infectives (From admission, onward)   None     Indication(s): None identified  Post-operative Assessment:  Post-procedure Vital Signs:  Pulse/HCG Rate: (!) 106  Temp: (!) 97 F (36.1 C) Resp: 16 BP: 112/62 SpO2: 98 %  EBL: None  Complications: No immediate post-treatment complications observed by team, or reported by patient.  Note: The patient tolerated the entire procedure well. A repeat set of vitals were taken after the procedure and the patient was kept under observation following institutional policy, for this type of procedure. Post-procedural neurological assessment was performed, showing return to baseline, prior to discharge. The patient was provided with post-procedure discharge instructions, including a section on how to identify potential problems. Should any problems arise concerning this procedure, the  patient was given instructions to immediately contact us, at any time, without hesitation. In any case, we plan to contact the patient by telephone for a follow-up status report regarding this interventional procedure.  Comments:  No additional relevant information.  Plan of Care  Orders:  Orders Placed This Encounter  Procedures  . PUMP REFILL    Maintain Protocol by having two(2) healthcare providers during procedure and programming.    Scheduling Instructions:     Please refill intrathecal pump today.    Order Specific Question:   Where will this procedure be performed?    Answer:   [facility]  . PUMP REFILL    Whenever possible schedule on a procedure today.    Standing Status:   Future    Standing Expiration Date:   09/26/2020    Scheduling Instructions:     Please schedule intrathecal pump refill based on pump programming. Avoid schedule intervals of more than 120 days (4 months).    Order Specific Question:   Where will this procedure be performed?    Answer:   [facility]  . Informed Consent Details: Physician/Practitioner Attestation; Transcribe to consent form and obtain patient signature    Provider Attestation: I, [place] Dossie Arbour, MD, (Pain Management Specialist), the physician/practitioner, attest that I have discussed with the patient the benefits, risks, side effects, alternatives, likelihood of achieving goals and potential problems during recovery for the procedure that I have provided informed consent.    Scheduling Instructions:     Procedure: Intrathecal Pump Refill     Attending Physician: Beatriz Chancellor A. Dossie Arbour, MD     Indications: Chronic Pain Syndrome (G89.4)     Transcribe to consent form and obtain patient signature.   Chronic Opioid Analgesic:  Intrathecal PF-Fentanyl 502.2 mcg/day (20.9 mcg/hr) MME/day: 50.16 mg/day.   Medications ordered for procedure: No orders of the defined types were placed in this encounter.  Medications  administered: Delfino Lovett  AWynetta Emery "Rick" had no medications administered during this visit.  See the medical record for exact dosing, route, and time of administration.  Follow-up plan:   Return for Pump Refill (Max:8mo.       Interventional management options:  Considering:   Palliative intrathecal pump refills  No other procedures currently under consideration   Palliative PRN treatment(s):   Palliative management of intrathecal pump        Recent Visits No visits were found meeting these conditions. Showing recent visits within past 90 days and meeting all other requirements Today's Visits Date Type Provider Dept  04/29/20 Procedure visit NMilinda Pointer MD [facility]  Showing today's visits and meeting all other requirements Future Appointments No visits were found meeting these conditions. Showing future appointments within next 90 days and meeting all other requirements  Disposition: Discharge home  Discharge (Date  Time): 04/29/2020; 1045 hrs.   Primary Care Physician: FSofie Hartigan MD [facility] Note by: FGaspar Cola MD Date: 04/29/2020; Time: 12:14 PM  Disclaimer:  Medicine is not an eChief Strategy Officer The only guarantee in medicine is that nothing is guaranteed. It is important to note that the decision to proceed with this intervention was based on the information collected from the patient. The Data and conclusions were drawn from the patient's questionnaire, the interview, and the physical examination. Because the information was provided in large part by the patient, it cannot be guaranteed that it has not been purposely or unconsciously manipulated. Every effort has been made to obtain as much relevant data as possible for this evaluation. It is important to note that the conclusions that lead to this procedure are derived in large part from the available data. Always take into account that the treatment  will also be dependent on availability of resources and existing treatment guidelines, considered by other Pain Management Practitioners as being common knowledge and practice, at the time of the intervention. For Medico-Legal purposes, it is also important to point out that variation in procedural techniques and pharmacological choices are the acceptable norm. The indications, contraindications, technique, and results of the above procedure should only be interpreted and judged by a Board-Certified Interventional Pain Specialist with extensive familiarity and expertise in the same exact procedure and technique.

## 2020-04-30 ENCOUNTER — Telehealth: Payer: Self-pay | Admitting: *Deleted

## 2020-04-30 NOTE — Telephone Encounter (Signed)
No problems post pump refill. 

## 2020-05-04 ENCOUNTER — Inpatient Hospital Stay: Payer: Medicare Other | Admitting: Hematology and Oncology

## 2020-05-04 ENCOUNTER — Inpatient Hospital Stay: Payer: Medicare Other

## 2020-05-05 ENCOUNTER — Inpatient Hospital Stay: Payer: Medicare Other

## 2020-05-05 ENCOUNTER — Encounter: Payer: Self-pay | Admitting: Hematology and Oncology

## 2020-05-05 ENCOUNTER — Inpatient Hospital Stay (HOSPITAL_BASED_OUTPATIENT_CLINIC_OR_DEPARTMENT_OTHER): Payer: Medicare Other | Admitting: Hematology and Oncology

## 2020-05-05 ENCOUNTER — Other Ambulatory Visit: Payer: Medicare Other

## 2020-05-05 ENCOUNTER — Inpatient Hospital Stay: Payer: Medicare Other | Attending: Hematology and Oncology

## 2020-05-05 ENCOUNTER — Telehealth: Payer: Self-pay

## 2020-05-05 ENCOUNTER — Other Ambulatory Visit: Payer: Self-pay

## 2020-05-05 VITALS — BP 139/81 | HR 76 | Temp 97.7°F | Resp 17

## 2020-05-05 VITALS — BP 107/65 | HR 83 | Temp 96.5°F | Resp 18 | Wt 167.0 lb

## 2020-05-05 DIAGNOSIS — Z5112 Encounter for antineoplastic immunotherapy: Secondary | ICD-10-CM | POA: Diagnosis present

## 2020-05-05 DIAGNOSIS — I2782 Chronic pulmonary embolism: Secondary | ICD-10-CM

## 2020-05-05 DIAGNOSIS — Z79899 Other long term (current) drug therapy: Secondary | ICD-10-CM | POA: Diagnosis not present

## 2020-05-05 DIAGNOSIS — C9002 Multiple myeloma in relapse: Secondary | ICD-10-CM

## 2020-05-05 DIAGNOSIS — D539 Nutritional anemia, unspecified: Secondary | ICD-10-CM | POA: Diagnosis not present

## 2020-05-05 DIAGNOSIS — C9000 Multiple myeloma not having achieved remission: Secondary | ICD-10-CM

## 2020-05-05 LAB — MAGNESIUM: Magnesium: 1.8 mg/dL (ref 1.7–2.4)

## 2020-05-05 LAB — CBC WITH DIFFERENTIAL/PLATELET
Abs Immature Granulocytes: 0.03 10*3/uL (ref 0.00–0.07)
Basophils Absolute: 0 10*3/uL (ref 0.0–0.1)
Basophils Relative: 0 %
Eosinophils Absolute: 0.1 10*3/uL (ref 0.0–0.5)
Eosinophils Relative: 1 %
HCT: 37 % — ABNORMAL LOW (ref 39.0–52.0)
Hemoglobin: 12.6 g/dL — ABNORMAL LOW (ref 13.0–17.0)
Immature Granulocytes: 0 %
Lymphocytes Relative: 12 %
Lymphs Abs: 1.2 10*3/uL (ref 0.7–4.0)
MCH: 34.8 pg — ABNORMAL HIGH (ref 26.0–34.0)
MCHC: 34.1 g/dL (ref 30.0–36.0)
MCV: 102.2 fL — ABNORMAL HIGH (ref 80.0–100.0)
Monocytes Absolute: 0.3 10*3/uL (ref 0.1–1.0)
Monocytes Relative: 3 %
Neutro Abs: 8.2 10*3/uL — ABNORMAL HIGH (ref 1.7–7.7)
Neutrophils Relative %: 84 %
Platelets: 190 10*3/uL (ref 150–400)
RBC: 3.62 MIL/uL — ABNORMAL LOW (ref 4.22–5.81)
RDW: 13.1 % (ref 11.5–15.5)
WBC: 9.8 10*3/uL (ref 4.0–10.5)
nRBC: 0 % (ref 0.0–0.2)

## 2020-05-05 LAB — COMPREHENSIVE METABOLIC PANEL
ALT: 15 U/L (ref 0–44)
AST: 23 U/L (ref 15–41)
Albumin: 4 g/dL (ref 3.5–5.0)
Alkaline Phosphatase: 44 U/L (ref 38–126)
Anion gap: 10 (ref 5–15)
BUN: 19 mg/dL (ref 8–23)
CO2: 22 mmol/L (ref 22–32)
Calcium: 8.7 mg/dL — ABNORMAL LOW (ref 8.9–10.3)
Chloride: 105 mmol/L (ref 98–111)
Creatinine, Ser: 1.48 mg/dL — ABNORMAL HIGH (ref 0.61–1.24)
GFR calc Af Amer: 53 mL/min — ABNORMAL LOW (ref >60)
GFR calc non Af Amer: 46 mL/min — ABNORMAL LOW (ref >60)
Glucose, Bld: 131 mg/dL — ABNORMAL HIGH (ref 70–99)
Potassium: 4.3 mmol/L (ref 3.5–5.1)
Sodium: 137 mmol/L (ref 135–145)
Total Bilirubin: 0.6 mg/dL (ref 0.3–1.2)
Total Protein: 6.2 g/dL — ABNORMAL LOW (ref 6.5–8.1)

## 2020-05-05 MED ORDER — SODIUM CHLORIDE 0.9% FLUSH
10.0000 mL | INTRAVENOUS | Status: DC | PRN
  Filled 2020-05-05: qty 10

## 2020-05-05 MED ORDER — HEPARIN SOD (PORK) LOCK FLUSH 100 UNIT/ML IV SOLN
500.0000 [IU] | Freq: Once | INTRAVENOUS | Status: AC
  Administered 2020-05-05: 500 [IU] via INTRAVENOUS
  Filled 2020-05-05: qty 5

## 2020-05-05 MED ORDER — SODIUM CHLORIDE 0.9 % IV SOLN
Freq: Once | INTRAVENOUS | Status: AC
  Filled 2020-05-05: qty 250

## 2020-05-05 MED ORDER — SODIUM CHLORIDE 0.9 % IV SOLN
16.0000 mg/kg | Freq: Once | INTRAVENOUS | Status: AC
  Administered 2020-05-05: 1200 mg via INTRAVENOUS
  Filled 2020-05-05: qty 60

## 2020-05-05 MED ORDER — SODIUM CHLORIDE 0.9% FLUSH
10.0000 mL | Freq: Once | INTRAVENOUS | Status: AC
  Administered 2020-05-05: 10 mL via INTRAVENOUS
  Filled 2020-05-05: qty 10

## 2020-05-05 MED FILL — Medication: INTRATHECAL | Qty: 1 | Status: AC

## 2020-05-05 NOTE — Progress Notes (Signed)
[facility]  [address], [address] [place], [place] [address] Phone: [phone]  Fax: [phone]   Clinic Day:  05/05/2020  Referring physician: Sofie Hartigan, MD  Chief Complaint: Logan Perez is a 74 y.o. 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   HPI: The patient was last seen in the medical oncology clinic on 03/04/2020. At that time, he was doing well.  He denied any bone pain or interval infections.  Exam was stable. Hematocrit was 38.0, hemoglobin 12.9, MCV 102.2, platelets 219,000, WBC 8,500. Creatinine was 1.53. CrCl was 44 ml/min. Magnesium was 1.9. M spike was 0.2 gm/dL. He received cycle #42 daratumumab.  Labs on 04/01/2020 revealed a hematocrit 35.4, hemoglobin 12.3, MCV 101.4, platelets 201,000, WBC 7,300. Creatinine was 1.52 (CrCl was 44 ml/min). Calcium was 8.8. Total protein was 6.2. Magnesium was 1.8. M spike was 0.2 gm/dL. Kappa free light chains were 6.6, lambda free light chains 2.9, and ratio was 2.28. He received cycle #43 daratumumab.  During the interim, he has been "alright."  He has nausea and a non-productive cough in the mornings, but once he eats, the nausea goes away. He had one episode of palpitations about a month ago, for which he took Cardizem. He states that he has chronic diarrhea. He will have a solid bowel movement, and then an hour later, has a loose stool. He notes that his steroids decrease his bowel movements. He denies abdominal pain and chest pain. His neuropathy and lower back pain are stable. Orange juice helps his restless legs.  He reports dry eyes.   Past Medical History:  Diagnosis Date  . Anxiety   . Atrial fibrillation ([place])   . BPH (benign prostatic hyperplasia)   . Complication of anesthesia    BAD HEADACHE NIGHT OF FIRST CATARACT  . Compression fracture of lumbar vertebra ([place])   .  Difficulty voiding   . Dysrhythmia    A FIB  . Elevated PSA   . GERD (gastroesophageal reflux disease)   . Hearing aid worn    bilateral  . History of kidney stones   . HLD (hyperlipidemia)   . HOH (hard of hearing)   . Hypertension   . Multiple myeloma ([place])   . Neuropathy    feet. 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  . Pain    BACK  . Palpitations   . Pneumonia   . Pulmonary embolism ([place])   . Sepsis ([place])   . Stroke [facility])    TIA, detected on CT scan. pt was unaware    Past Surgical History:  Procedure Laterality Date  . BACK SURGERY  1994  . CATARACT EXTRACTION W/PHACO Right 05/02/2016   Procedure: CATARACT EXTRACTION PHACO AND INTRAOCULAR LENS PLACEMENT (IOC);  Surgeon: Birder Robson, MD;  Location: [facility] ORS;  Service: Ophthalmology;  Laterality: Right;  [place] 1.06 AP% 20.6 CDE 13.70 FLUID PACK LOT # [id number]  . CATARACT EXTRACTION W/PHACO Left 05/16/2016   Procedure: CATARACT EXTRACTION PHACO AND INTRAOCULAR LENS PLACEMENT (IOC);  Surgeon: Birder Robson, MD;  Location: [facility] ORS;  Service: Ophthalmology;  Laterality: Left;  [place] 01:43 AP% 19.8 CDE 20.45 FLUID PACK LOT #[id number] H  . COLONOSCOPY WITH PROPOFOL N/A 03/22/2017   Procedure: COLONOSCOPY WITH PROPOFOL;  Surgeon: Lucilla Lame, MD;  Location: [facility];  Service: Endoscopy;  Laterality: N/A;  has port  . ESOPHAGOGASTRODUODENOSCOPY (EGD) WITH PROPOFOL  03/22/2017   Procedure: ESOPHAGOGASTRODUODENOSCOPY (  EGD) WITH PROPOFOL;  Surgeon: Lucilla Lame, MD;  Location: [facility];  Service: Endoscopy;;  . EYE SURGERY    . KNEE ARTHROSCOPY Left 1992  . LIMBAL STEM CELL TRANSPLANT    . PAIN PUMP IMPLANTATION  2012  . PAIN PUMP IMPLANTATION N/A 09/04/2017   Procedure: INTRATHECAL PUMP BATTERY CHANGE;  Surgeon: Milinda Pointer, MD;  Location: [facility] ORS;  Service: Neurosurgery;  Laterality: N/A;  . PORTA CATH INSERTION N/A 12/04/2016   Procedure: Glori Luis Cath Insertion;  Surgeon: Algernon Huxley, MD;  Location: [place] [facility]  [facility];  Service: Cardiovascular;  Laterality: N/A;  . stem cell implant  2008   [facility]    Family History  Problem Relation Age of Onset  . Cancer Father        throat  . Kidney disease Sister   . Stroke Other   . Stroke Mother   . Bladder Cancer Neg Hx   . Prostate cancer Neg Hx   . Kidney cancer Neg Hx     Social History:  reports that he quit smoking about 44 years ago. His smoking use included cigarettes. He has a 30.00 pack-year smoking history. He has never used smokeless tobacco. He reports that he does not drink alcohol and does not use drugs. He has a wire haired dachshund. He lives in [place]. His wife's name is [name]. The patient is alone today.  Allergies:  Allergies  Allergen Reactions  . Azithromycin Diarrhea and Other (See Comments)    Possible cause of C. Diff Possible cause of C. Diff Possible cause of C. Diff Possible cause of C. Diff  . Bee Pollen Other (See Comments)    Sneezing, watery eyes, runny nose  . Pollen Extract Other (See Comments)    Sneezing, watery eyes, runny nose  . 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 Rivaroxaban Rash    Current Medications: Current Outpatient Medications  Medication Sig Dispense Refill  . acetaminophen (TYLENOL) 500 MG tablet Take 500 mg by mouth 2 (two) times a day.    . baclofen (LIORESAL) 10 MG tablet Take 1 tablet (10 mg total) by mouth 2 (two) times daily. 180 tablet 3  . bismuth subsalicylate (PEPTO BISMOL) 262 MG/15ML suspension Take 30 mLs by mouth every 6 (six) hours as needed for diarrhea or loose stools.    . Calcium Carb-Cholecalciferol (CALCIUM-VITAMIN D) 500-400 MG-UNIT TABS Take 1 tablet daily by mouth.    . calcium citrate-vitamin D (CITRACAL+D) 315-200 MG-UNIT tablet Take 1 tablet by mouth 2 (two) times daily.    . cetirizine (ZYRTEC) 10 MG tablet Take 10 mg by mouth daily as needed for allergies.     . Cholecalciferol (VITAMIN D3) 2000 units  capsule Take 2,000 Units daily by mouth.     . Daratumumab (DARZALEX IV) Inject 1,200 mg every 30 (thirty) days into the vein.     [name] dexamethasone (DECADRON) 4 MG tablet TAKE 4 TABLETS BY MOUTH 1 HOUR PRIOR TO INFUSION, THEN 1 TABLET ON DAYS 2 AND 3 AS DIRECTED 30 tablet 0  . diltiazem (CARDIZEM CD) 120 MG 24 hr capsule Take 120 mg daily by mouth.     . diltiazem (CARDIZEM) 60 MG tablet Take 60 mg daily as needed by mouth (for increased heart rate > 140).     [name] diphenhydrAMINE (BENADRYL) 25 MG tablet Take 25 mg by mouth as directed. With chemo treatment    .  ELIQUIS 5 MG TABS tablet TAKE 1 TABLET(5 MG) BY MOUTH TWICE DAILY 60 tablet 0  . fluticasone (FLONASE) 50 MCG/ACT nasal spray Place 1 spray into the nose daily as needed for allergies.     . Hypromellose (ARTIFICIAL TEARS OP) Place 1 drop as needed into both eyes (for dry eyes).    [name] loperamide (IMODIUM) 2 MG capsule Take 4 mg as needed by mouth for diarrhea or loose stools.     [name] loratadine (CLARITIN) 10 MG tablet Take 10 mg by mouth daily as needed.     . lovastatin (MEVACOR) 20 MG tablet Take 20 mg by mouth every evening.     . montelukast (SINGULAIR) 10 MG tablet TAKE 1 TABLET THE DAY OF AND 2 DAYS AFTER INFUSION 90 tablet 1  . Multiple Vitamins-Iron (MULTIVITAMIN/IRON PO) Take 1 tablet daily by mouth.    . NON FORMULARY IT pump  Fentanyl 1,500.0 mcg/ml Bupivicaine 30.0 mg/ml Clonidine 300.0 mcg/ml Rate 527.3 mcg/day    . omeprazole (PRILOSEC) 20 MG capsule Take 20 mg by mouth daily.     . ondansetron (ZOFRAN) 8 MG tablet Take 1 tablet (8 mg total) by mouth as directed. Take with chemo and can take it as needed 20 tablet 1  . potassium chloride SA (KLOR-CON) 20 MEQ tablet TAKE 1 TABLET(20 MEQ) BY MOUTH DAILY 90 tablet 0  . tamsulosin (FLOMAX) 0.4 MG CAPS capsule TAKE 1 CAPSULE(0.4 MG) BY MOUTH DAILY 90 capsule 4  . valACYclovir (VALTREX) 500 MG tablet TAKE 1 TABLET BY MOUTH EVERY DAY 30 tablet 3  . vitamin B-12 (CYANOCOBALAMIN) 1000  MCG tablet Take 1,000 mcg daily by mouth.      No current facility-administered medications for this visit.   Facility-Administered Medications Ordered in Other Visits  Medication Dose Route Frequency Provider Last Rate Last Admin  . heparin lock flush 100 unit/mL  500 Units Intravenous Once Prisilla Kocsis C, MD      . sodium chloride flush (NS) 0.9 % injection 10 mL  10 mL Intravenous PRN Lequita Asal, MD        Review of Systems  Constitutional: Negative for chills, diaphoresis, fever, malaise/fatigue and weight loss (up 1 lb).       Doing alright.  HENT: Positive for hearing loss (hearing aid). Negative for congestion, ear pain, nosebleeds, sinus pain, sore throat and tinnitus.   Eyes: Negative.  Negative for blurred vision, double vision, photophobia and pain.       Dry eyes  Respiratory: Positive for cough (non productive, in the mornings). Negative for hemoptysis, sputum production, shortness of breath and wheezing.   Cardiovascular: Positive for palpitations (one episode). Negative for chest pain, orthopnea, leg swelling and PND.       H/o atrial fibrillation.  Gastrointestinal: Positive for diarrhea (on Imodium) and nausea (in the morning, goes away when he eats). Negative for abdominal pain, blood in stool, constipation, heartburn, melena and vomiting.  Genitourinary: Negative.  Negative for dysuria, frequency, hematuria and urgency.  Musculoskeletal: Negative for back pain (chronic; Fentanyl/bupivacaine/clonidine pump; better with tylenol), falls, joint pain, myalgias and neck pain.  Skin: Negative.  Negative for itching and rash.  Neurological: Positive for sensory change (restless legs; neuropathy in feet). Negative for dizziness, tingling, tremors, speech change, focal weakness, seizures, weakness and headaches.  Endo/Heme/Allergies: Negative.  Negative for environmental allergies. Does not bruise/bleed easily.  Psychiatric/Behavioral: Negative.  Negative for  depression and memory loss. The patient is not nervous/anxious and does not have insomnia.  All other systems reviewed and are negative.  Performance status (ECOG): 1  Vitals Blood pressure 107/65, pulse 83, temperature (!) 96.5 F (35.8 C), temperature source Tympanic, resp. rate 18, weight 167 lb (75.8 kg), SpO2 99 %.   Physical Exam Vitals and nursing note reviewed.  Constitutional:      General: He is not in acute distress.    Appearance: He is well-developed. He is not diaphoretic.  HENT:     Head: Normocephalic and atraumatic.     Comments: Short brown hair.    Mouth/Throat:     Pharynx: No oropharyngeal exudate.  Eyes:     General: No scleral icterus.    Conjunctiva/sclera: Conjunctivae normal.     Pupils: Pupils are equal, round, and reactive to light.     Comments: Glasses. Blue eyes.  Neck:     Vascular: No JVD.  Cardiovascular:     Rate and Rhythm: Normal rate and regular rhythm.     Heart sounds: Normal heart sounds. No murmur heard.  No gallop.   Pulmonary:     Effort: Pulmonary effort is normal. No respiratory distress.     Breath sounds: Normal breath sounds. No wheezing or rales.  Abdominal:     General: Bowel sounds are normal. There is no distension.     Palpations: Abdomen is soft. There is no mass.     Tenderness: There is no abdominal tenderness. There is no guarding or rebound.     Comments: Right sided implanted Fentanyl/bupivacaine/clonidine pump.   Musculoskeletal:        General: No tenderness. Normal range of motion.     Cervical back: Normal range of motion and neck supple.  Lymphadenopathy:     Head:     Right side of head: No preauricular, posterior auricular or occipital adenopathy.     Left side of head: No preauricular, posterior auricular or occipital adenopathy.     Cervical: No cervical adenopathy.     Upper Body:     Right upper body: No supraclavicular or axillary adenopathy.     Left upper body: No supraclavicular or axillary  adenopathy.     Lower Body: No right inguinal adenopathy. No left inguinal adenopathy.  Skin:    General: Skin is warm and dry.     Coloration: Skin is not pale.     Findings: No erythema or rash.  Neurological:     Mental Status: He is alert and oriented to person, place, and time.  Psychiatric:        Behavior: Behavior normal.        Thought Content: Thought content normal.        Judgment: Judgment normal.    Infusion on 05/05/2020  Component Date Value Ref Range Status  . WBC 05/05/2020 9.8  4.0 - 10.5 K/uL Final  . RBC 05/05/2020 3.62* 4.22 - 5.81 MIL/uL Final  . Hemoglobin 05/05/2020 12.6* 13.0 - 17.0 g/dL Final  . HCT 05/05/2020 37.0* 39 - 52 % Final  . MCV 05/05/2020 102.2* 80.0 - 100.0 fL Final  . MCH 05/05/2020 34.8* 26.0 - 34.0 pg Final  . MCHC 05/05/2020 34.1  30.0 - 36.0 g/dL Final  . RDW 05/05/2020 13.1  11.5 - 15.5 % Final  . Platelets 05/05/2020 190  150 - 400 K/uL Final  . nRBC 05/05/2020 0.0  0.0 - 0.2 % Final  . Neutrophils Relative % 05/05/2020 84  % Final  . Neutro Abs 05/05/2020 8.2* 1.7 - 7.7 K/uL Final  .  Lymphocytes Relative 05/05/2020 12  % Final  . Lymphs Abs 05/05/2020 1.2  0.7 - 4.0 K/uL Final  . Monocytes Relative 05/05/2020 3  % Final  . Monocytes Absolute 05/05/2020 0.3  0 - 1 K/uL Final  . Eosinophils Relative 05/05/2020 1  % Final  . Eosinophils Absolute 05/05/2020 0.1  0 - 0 K/uL Final  . Basophils Relative 05/05/2020 0  % Final  . Basophils Absolute 05/05/2020 0.0  0 - 0 K/uL Final  . Immature Granulocytes 05/05/2020 0  % Final  . Abs Immature Granulocytes 05/05/2020 0.03  0.00 - 0.07 K/uL Final   Performed at [facility], [address], [place], [place] [address]  . Sodium 05/05/2020 137  135 - 145 mmol/L Final  . Potassium 05/05/2020 4.3  3.5 - 5.1 mmol/L Final  . Chloride 05/05/2020 105  98 - 111 mmol/L Final  . CO2 05/05/2020 22  22 - 32 mmol/L Final  . Glucose, Bld 05/05/2020 131* 70 - 99 mg/dL Final   Glucose  reference range applies only to samples taken after fasting for at least 8 hours.  . BUN 05/05/2020 19  8 - 23 mg/dL Final  . Creatinine, Ser 05/05/2020 1.48* 0.61 - 1.24 mg/dL Final  . Calcium 05/05/2020 8.7* 8.9 - 10.3 mg/dL Final  . Total Protein 05/05/2020 6.2* 6.5 - 8.1 g/dL Final  . Albumin 05/05/2020 4.0  3.5 - 5.0 g/dL Final  . AST 05/05/2020 23  15 - 41 U/L Final  . ALT 05/05/2020 15  0 - 44 U/L Final  . Alkaline Phosphatase 05/05/2020 44  38 - 126 U/L Final  . Total Bilirubin 05/05/2020 0.6  0.3 - 1.2 mg/dL Final  . GFR calc non Af Amer 05/05/2020 46* >60 mL/min Final  . GFR calc Af Amer 05/05/2020 53* >60 mL/min Final  . Anion gap 05/05/2020 10  5 - 15 Final   Performed at [facility], [address] [place], [place] [address]  . Magnesium 05/05/2020 1.8  1.7 - 2.4 mg/dL Final   Performed at [facility], [address], [place], [place] [address]    Assessment:  Jerimy Johanson is a 74 y.o. male with stage III mutiple myeloma. He initially presented with progressive back pain beginning in 12/2006. MRI revealed "spots and compression fractures". He began Velcade, thalidomide, and Decadron. In 08/2007, he underwent high dose chemotherapy and autologous stem cell transplant. 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  He recurred with a rising M-spike (2.7) with repeat M spike (1.7 gm/dl) in 03/2010. He was initially treated with Velcade (02/08/2010 - 05/10/2010). He then began Revlimid (15 mg 3 weeks on/1 week off) and Decadron (40 mg on day 1, 8, 15, 22). Because of significant side effect with Decadron his dose was decreased to 10 mg once a week in 07/2010.   He was on maintenance Revlimid. Revlimid was initially10 mg 3 weeks on/1 week off. This was changed to 10 mg 2 weeks on/2 weeks off secondary to right nipple tenderness. His dose was increased to 10 mg 3 weeks on/1 week off with Decadron 10 mg a week (on  Sundays) and then Revlamid 15 mg 3 weeks on and 1 week off with Decadron on Sundays. He began Pomalyst4 mg 3 weeks on/1 week off with Decadron on 08/27/2015.  SPEPrevealed no monoclonal protein (04/21/2015) and 0.5 gm/dL on 09/22/2015 and 10/20/2015. M spikewas 0.1 on 02/02/2016, 03/01/2016, 03/29/2016, 04/26/2016, and 05/24/2016. M spikewas  0 on 10/11/2016, 11/28/2016, 02/05/2017, 03/21/2017, 08/02/2017, 10/04/2017, 12/27/2017, 01/24/2018, and 02/21/2018. M spikewas 0.1 on 11/01/2017, 0.1 on 11/29/2017, 0 on 12/27/2017, 0 on 01/24/2018, 0 on 02/21/2018, 0.1 on 03/21/2018, 0.3 on 04/18/2018, 0.1 on 05/16/2018, 0 on 06/13/2018, 0.2 on 08/08/2018, 0 on 09/09/2018, 0.1 on 11/04/2018, 0 on 12/02/2018, 0.1 on 12/30/2018, 0 on 01/27/2019, 0.1 on 02/24/2019, 0.1 on 03/24/2019, 0.1 on 04/23/2019, 0 on 05/19/2019, 0.1 on 06/17/2019, 0.2 on 07/15/2019, 0.2 on 08/18/2019, 0.2 on 09/15/2019, 0.1 on 10/13/2019, 0.1 on 11/13/2019, 0 on 12/11/2019, 0.3 on 01/08/2020, 0.2 on 02/05/2020, 0.2 on 03/04/2020, 0.2 on 04/01/2020, and 0 on 05/05/2020.  Free light chainshave been monitored. Kappa free light chainswere 18.54 on 11/21/2013, 18.37 on 02/20/2014, 18.93 on 05/22/2014, 32.58 (high; normal ratio 1.27) on 08/21/2014, 51.53 (high; elevated ratio of 2.12) on 11/13/2014, 28.08 (ratio 1.73) on 12/09/2014, 23.71 (ratio 2.17) on 01/18/2015, 92.93 (ratio 9.49) on 04/21/2015, 93.44 (ratio 10.28) on 05/26/2015, 255.45 (ratio of 24.05) on 07/14/2015, 373.89 (ratio 48.31) on 08/04/2015, 474.33 (ratio 70.58) on 08/25/2015, 450.76 (ratio 55.44) on 09/22/2015, 453.4 (ratio 59.89) on 10/20/2015, 58.26 (ratio of >40.74) on 02/02/2016, 62.67 (ratio of 37.98) on 03/01/2016, 75.7 (ratio >50.47) on 03/29/2016, 79.7 (ratio 53.13) on 04/26/2016, 108.6 (ratio >72.4) on 05/24/2016, 8.3 (1.46 ratio) on 10/11/2016, 6.7 (0.81 ratio) on 02/05/2017, 7.8 (1.01 ratio) on 03/21/2017,7.6 (ratio 0.63) on 06/01/2017, 8.1 (ratio 1.13) on  11/29/2017, 9.6 (ratio 1.55) on 06/13/2018, 6.6 (ratio 2.06) on 07/11/2018, 6.9 (ratio 0.82) on 08/08/2018, 5.8 (ratio 1.66) on 09/09/2018, 5.8 (ratio 1.05) on 11/04/2018, 5.6(ratio 2.55)on 12/02/2018, 5.5 (ratio 1.96) on 01/27/2019, 7.6 (ratio 2.38) on 06/17/2019, 6.9 (ratio 1.21) on 11/13/2019, 6.6 (ratio 2.0) on 12/11/2019, 7.9 (ratio 2.14) on 02/05/2020, 6.6 (ratio 2.28) on 04/01/2020, and 7.0 (ratio 2.12) on 05/05/2020.  Bone surveyon 12/08/2014 was stable. Bone survey on 10/21/2015 revealed increase conspicuity of subcentimeter lytic lesions in the calvarium.  PET scan on 08/14/2018 revealed no evidence of active myeloma.  Bone survey on 11/04/2019 revealed stable small lytic lesions throughout the skull.  There were no other lytic lesions.  Bone marrow aspirate and biopsyon 11/04/2015 revealed an atypical monoclonal plasma cells estimated at 30-40% of marrow cells. Marrow was variably cellular (approximately 45%) with background trilineage hematopoiesis. There was no significant increase in marrow reticulin fibers. Storage iron was present.   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 He develoed herpes zoster in 04/2008. He developed a pulmonary embolismin 05/2013. He was initially on Xarelto, but is now on Eliquis. He had an episode of pneumonia around this time requiring a brief admission. He developed severe lower leg cramps on 08/07/2014 secondary to hypokalemia. Duplex was negative.   He was treated forC difficile colitis(Flagyl completed 07/30/2015). He has a chronic indwelling pain pump.  He received 4 cycles of Pomalyst and Decadron(08/27/2015 - 11/19/2015). Restaging studies document progressive disease. Kappa free light chains are increasing. SPEP revealed 0.5 gm/dL monoclonal protein then 1.3 gm/dL. Bone survey reveals increase conspicuity of subcentimeter lytic lesions in the calvarium. Bone marrow  reveals 30-40% plasma cells.   MUGAon 11/30/2015 revealed an ejection fraction of 46%. He is felt not to be a good candidate for Kyprolis. There were no focal wall motion abnormalities. He had a stress echo less than 1 year ago. He has a history of PVCs and atrial fibrillation. He takes Cardizem prn.  He received 17 weeks of daratumumab(Darzalex) (12/09/2015 - 05/25/2016). He tolerated treatment well without side effect.  Bone marrow  on 02/09/2016 revealed no diagnostic morphologic evidence of plasma cell myeloma. Marrow was normocellular to hypocellular marrow for age (ranging from 10-40%) with maturing trilineage hematopoiesis and mild multilineage dyspoiesis. There was patchy mild increase in reticulin. Storage iron was present. Flow cytometry revealed no definitive evidence of monoclonality. There was a non-specific atypical myeloid and monocytic findings with no increase in blasts. Cytogenetics were normal (46, XY).  He is currently37month s/p 2nd autologous stem cell transplanton 06/16/2016. 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 He also experienced atrial fibrillation with intermittent episodes of RVR requiring IV beta blockers. He is on prophylactic valacyclovir for 1 year post transplant.  He started vaccinations(DTaP-Pediatric triple vaccine, Hep B- Pediatrix triple vaccine, Haemophilus influenza B (Hib), inactivated polio virus (IPV), pneumococcal conjugate vaccine 13-valent (PCV 13)) on 04/17/2017. Recommendation was for [place]rix in 2 months. He had follow-up vaccinations on 08/28/2017. He had his second shingles vaccine. He received 18 month vaccinations- DTaP-Pediatric triple vaccine, Hep B- Pediatrix triple vaccine, Haemophilus influenza B (Hib), inactivated polio virus (IPV), pneumococcal conjugate vaccine 13-valent (PCV 13) on 02/05/2018.  He  is s/p43cycles ofdaratumumab(Darzalex) post transplant(12/05/2016 -04/01/2020).  PET scanon 08/14/2018 revealed no evidence of active myeloma on whole-body FDG PET scan. There was no evidence soft tissue plasmacytoma. There was no clear evidence of lytic lesions on the CT portion exam. Some lucencies in the pelvis were stable. There were chronic compression fractures in the lower thoracic spine.  Bone survey on 11/04/2019 revealed stable small lytic lesions noted throughout the skull.  There were no other lytic lesions. Exam was stable.   He has a history of hypomagnesemiathat required IV magnesium (2-4 gm) weekly. He has not required magnesium since 10/24/2016. Patient has atrial fibrillationand is on Eliquis.  Symptomatically, he is doing well.  Neuropathy is stable.  He has chronic diarrhea which decreases with steroids.  Exam is stable.  Plan: 1.   Labs today: CBC with diff, CMP, Mg, SPEP, FLCA 2.Multiple myeloma Clinically, he continues to do well M spike fluctuates between 0-0.2 gm/dL. M-spikeis 0 today. PET scan on 08/14/2018 revealed no evidence of active myeloma. Bone survey on 11/04/2019 revealed stable small lytic lesions in the skull.             No new lesions. Labs reviewed.  Cycle #44 daratumumab today.    Confirm patient took his premeds at home (Tylenol, Benadryl, ondansetron, Singulair and Decadron).   Continue every other cycle visits. Continue prophylactic valacyclovir (until 3 months s/pdaratumumab). He is no longer followed in the transplant center Anticipate periodic phone follow-[facility] Discuss symptom management.  He has antiemetics at home to use on a prn bases.  Interventions are adequate.  .         3. Macrocytic anemia Hematocrit37.0. Hemoglobin12.6. MCV102.2. B12 and folate were normal on 12/11/2019.  TSH was 4.542 (slightly elevated) with a normal free T4. Continue  to monitor. 4.Neuropathy Patient has a stable grade 1 neuropathy. Continue to monitor. 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 He takes Imodium daily. Daratumumab side effects: 16% diarrhea GI notes diarrhea differential: NSAIDs, drug injury vs inflammatory bowel disease.             IBD is felt less likely. Clinically, he notes decrease diarrhea after he takes his monthly steroids. 6.History of pulmonary embolism(2014) Continue Eliquis.  He denies any excess bruising or bleeding.  7.   Cycle #44 daratumumab today.   8.   RTC in 1 month for labs (CBC with diff, CMP,Mg, SPEP) and cycle #45 daratumumab. 9.   RTC in 2 months for MD assessment, labs (CBC with diff, CMP, Mg, SPEP, FLCA) and cycle #46 daratumumab.  I discussed the assessment and treatment plan with the patient.  The patient was provided an opportunity to ask questions and all were answered.  The patient agreed with the plan and demonstrated an understanding of the instructions.  The patient was advised to call back if the symptoms worsen or if the condition fails to improve as anticipated.   Lequita Asal, MD, PhD    05/05/2020, 9:16 AM  I, Mirian Mo Tufford, am acting as Education administrator for [employer]. Mike Gip, MD, PhD.  I, Harsha Yusko C. Mike Gip, MD, have reviewed the above documentation for accuracy and completeness, and I agree with the above.

## 2020-05-05 NOTE — Progress Notes (Signed)
Patient verbalized taking all of his pre meds this am prior to chemotherapy.

## 2020-05-05 NOTE — Progress Notes (Signed)
Patient reports a mild cough for about 2-3 days now. And lower back pain (4/10).

## 2020-05-05 NOTE — Telephone Encounter (Signed)
The patient was informed that his calcium was a little low today.

## 2020-05-06 LAB — PROTEIN ELECTROPHORESIS, SERUM
A/G Ratio: 1.8 — ABNORMAL HIGH (ref 0.7–1.7)
Albumin ELP: 3.8 g/dL (ref 2.9–4.4)
Alpha-1-Globulin: 0.1 g/dL (ref 0.0–0.4)
Alpha-2-Globulin: 0.8 g/dL (ref 0.4–1.0)
Beta Globulin: 1 g/dL (ref 0.7–1.3)
Gamma Globulin: 0.2 g/dL — ABNORMAL LOW (ref 0.4–1.8)
Globulin, Total: 2.1 g/dL — ABNORMAL LOW (ref 2.2–3.9)
Total Protein ELP: 5.9 g/dL — ABNORMAL LOW (ref 6.0–8.5)

## 2020-05-06 LAB — KAPPA/LAMBDA LIGHT CHAINS
Kappa free light chain: 7 mg/L (ref 3.3–19.4)
Kappa, lambda light chain ratio: 2.12 — ABNORMAL HIGH (ref 0.26–1.65)
Lambda free light chains: 3.3 mg/L — ABNORMAL LOW (ref 5.7–26.3)

## 2020-05-07 ENCOUNTER — Ambulatory Visit: Payer: Medicare Other | Admitting: Urology

## 2020-05-18 ENCOUNTER — Other Ambulatory Visit: Payer: Self-pay

## 2020-05-18 ENCOUNTER — Encounter: Payer: Self-pay | Admitting: Pain Medicine

## 2020-05-18 ENCOUNTER — Ambulatory Visit: Payer: Medicare Other | Attending: Pain Medicine | Admitting: Pain Medicine

## 2020-05-18 VITALS — BP 105/66 | HR 94 | Temp 97.2°F | Resp 16 | Ht 67.0 in | Wt 165.0 lb

## 2020-05-18 DIAGNOSIS — Z451 Encounter for adjustment and management of infusion pump: Secondary | ICD-10-CM

## 2020-05-18 DIAGNOSIS — C419 Malignant neoplasm of bone and articular cartilage, unspecified: Secondary | ICD-10-CM

## 2020-05-18 DIAGNOSIS — G893 Neoplasm related pain (acute) (chronic): Secondary | ICD-10-CM

## 2020-05-18 DIAGNOSIS — G894 Chronic pain syndrome: Secondary | ICD-10-CM | POA: Diagnosis not present

## 2020-05-18 DIAGNOSIS — Z978 Presence of other specified devices: Secondary | ICD-10-CM

## 2020-05-18 DIAGNOSIS — C9001 Multiple myeloma in remission: Secondary | ICD-10-CM

## 2020-05-18 DIAGNOSIS — S22080S Wedge compression fracture of T11-T12 vertebra, sequela: Secondary | ICD-10-CM

## 2020-05-18 DIAGNOSIS — Z95828 Presence of other vascular implants and grafts: Secondary | ICD-10-CM

## 2020-05-18 DIAGNOSIS — M5416 Radiculopathy, lumbar region: Secondary | ICD-10-CM

## 2020-05-18 DIAGNOSIS — M545 Low back pain, unspecified: Secondary | ICD-10-CM

## 2020-05-18 NOTE — Progress Notes (Signed)
Safety precautions to be maintained throughout the outpatient stay will include: orient to surroundings, keep bed in low position, maintain call bell within reach at all times, provide assistance with transfer out of bed and ambulation.  

## 2020-05-18 NOTE — Progress Notes (Signed)
PROVIDER NOTE: Information contained herein reflects review and annotations entered in association with encounter. Interpretation of such information and data should be left to medically-trained personnel. Information provided to patient can be located elsewhere in the medical record under "Patient Instructions". Document created using STT-dictation technology, any transcriptional errors that may result from process are unintentional.    Patient: Logan Perez  Service Category: Procedure  Provider: Gaspar Cola, MD  DOB: October 16, 1945  DOS: 05/18/2020  Location: [facility]  MRN: [id number]  Setting: Ambulatory - outpatient  Referring Provider: Sofie Hartigan, MD  Type: Established Patient  Specialty: Interventional Pain Management  PCP: Sofie Hartigan, MD   Primary Reason for Visit: Interventional Pain Management Treatment. CC: Back Pain (lower)  Procedure:          Intrathecal Drug Delivery System (IDDS):  Type: Analysis & Programming [phone]) 5% rate decrease Region: Abdominal Laterality: Right  Type of Pump: Medtronic Synchromed II Delivery Route: Intrathecal Type of Pain Treated: Neuropathic/Nociceptive Primary Medication Class: Opioid/opiate  Medication, Concentration, Infusion Program, & Delivery Rate: Please see scanned programming printout.   Indications: 1. Chronic pain syndrome   2. Malignant neoplasm of bone, unspecified location ([place])   3. Cancer associated pain   4. Chronic low back pain   5. Chronic lumbar radicular pain   6. Multiple myeloma in remission ([place])   7. Presence of implanted infusion pump (Medtronic, programmable, intrathecal pump)   8. Presence of intrathecal pump   9. Encounter for adjustment or management of infusion pump   10. Compression fracture of T12 vertebra (HCC) (70-75% magnitude) (with mild retropulsion)    Pain Assessment: Self-Reported Pain Score: 4 /10             Reported level is compatible with  observation.        The patient indicates that the recent increase in the rate did not really provide him much relief of the pain in the mornings but it did cause some numbness in the anterior portion of his legs.  He thinks that he needs to go back to what he had before and therefore today we will decrease his rate by 5%, which is what we had increased last time.  Pharmacotherapy Assessment  Analgesic: Intrathecal PF-Fentanyl 502.2 mcg/day (20.9 mcg/hr) MME/day: 50.16 mg/day.   Monitoring: [place] PMP: PDMP reviewed during this encounter.       Pharmacotherapy: No side-effects or adverse reactions reported. Compliance: No problems identified. Effectiveness: Clinically acceptable. Plan: Refer to "POC".  UDS: No results found for: SUMMARY  Intrathecal Pump Therapy Assessment  Manufacturer: Medtronic Synchromed Type: Programmable Volume: 40 mL reservoir MRI compatibility: Yes   Drug content:  Primary Medication Class:Opioid Primary Medication:PF-Fentanyl(1500 mcg/mL) Secondary Medication:PF-Bupivacaine(30 mg/mL) Other Medication:PF-Clonidine(300 mcg/mL)   Programming:  Type: Simple continuous. See pump readout for details.   Changes:  Medication Change: None at this point Rate Change: 5% decrease  Reported side-effects or adverse reactions: None reported  Effectiveness: Described as relatively effective, allowing for increase in activities of daily living (ADL) Clinically meaningful improvement in function (CMIF): Sustained CMIF goals met  Plan: Analysis and programming  Pre-op Assessment:  [name] is a 74 y.o. (year old), male patient, seen today for interventional treatment. He  has a past surgical history that includes Knee arthroscopy (Left, 1992); Pain pump implantation (2012); stem cell implant (2008); Cataract extraction w/PHACO (Right, 05/02/2016); Cataract extraction w/PHACO (Left, 05/16/2016); Limbal stem cell transplant; PORTA CATH INSERTION (N/A,  12/04/2016); Colonoscopy with propofol (  N/A, 03/22/2017); Esophagogastroduodenoscopy (egd) with propofol (03/22/2017); Eye surgery; Back surgery (1994); and Pain pump implantation (N/A, 09/04/2017). [name] has a current medication list which includes the following prescription(s): acetaminophen, baclofen, bismuth subsalicylate, calcium-vitamin d, calcium citrate-vitamin d, cetirizine, vitamin d3, daratumumab, dexamethasone, diltiazem, diltiazem, diphenhydramine, eliquis, fluticasone, carboxymethylcellulose sodium, loperamide, loratadine, lovastatin, montelukast, multiple vitamins-iron, NON FORMULARY, omeprazole, ondansetron, potassium chloride sa, tamsulosin, valacyclovir, vitamin b-12, and [DISCONTINUED] PAIN MANAGEMENT IT PUMP REFILL. His primarily concern today is the Back Pain (lower)  Initial Vital Signs:  Pulse/HCG Rate: 94  Temp: (!) 97.2 F (36.2 C) Resp: 16 BP: 105/66 SpO2: 98 %  BMI: Estimated body mass index is 25.84 kg/m as calculated from the following:   Height as of this encounter: '5\' 7"'  (1.702 m).   Weight as of this encounter: 165 lb (74.8 kg).  Risk Assessment: Allergies: Reviewed. He is allergic to azithromycin, bee pollen, pollen extract, zoledronic acid, and rivaroxaban.  Allergy Precautions: None required Coagulopathies: Reviewed. None identified.  Blood-thinner therapy: None at this time Active Infection(s): Reviewed. None identified. [name] is afebrile  Site Confirmation: [name] was asked to confirm the procedure and laterality before marking the site Procedure checklist: Completed Consent: Before the procedure and under the influence of no sedative(s), amnesic(s), or anxiolytics, the patient was informed of the treatment options, risks and possible complications. To fulfill our ethical and legal obligations, as recommended by the American Medical Association's Code of Ethics, I have informed the patient of my clinical impression; the nature and purpose of  the treatment or procedure; the risks, benefits, and possible complications of the intervention; the alternatives, including doing nothing; the risk(s) and benefit(s) of the alternative treatment(s) or procedure(s); and the risk(s) and benefit(s) of doing nothing.  Mr. Laneve was provided with information about the general risks and possible complications associated with most interventional procedures. These include, but are not limited to: failure to achieve desired goals, infection, bleeding, organ or nerve damage, allergic reactions, paralysis, and/or death.  In addition, he was informed of those risks and possible complications associated to this particular procedure, which include, but are not limited to: damage to the implant; failure to decrease pain; local, systemic, or serious CNS infections, intraspinal abscess with possible cord compression and paralysis, or life-threatening such as meningitis; bleeding; organ damage; nerve injury or damage with subsequent sensory, motor, and/or autonomic system dysfunction, resulting in transient or permanent pain, numbness, and/or weakness of one or several areas of the body; allergic reactions, either minor or major life-threatening, such as anaphylactic or anaphylactoid reactions.  Furthermore, Mr. Coury was informed of those risks and complications associated with the medications. These include, but are not limited to: allergic reactions (i.e.: anaphylactic or anaphylactoid reactions); endorphine suppression; bradycardia and/or hypotension; water retention and/or peripheral vascular relaxation leading to lower extremity edema and possible stasis ulcers; respiratory depression and/or shortness of breath; decreased metabolic rate leading to weight gain; swelling or edema; medication-induced neural toxicity; particulate matter embolism and blood vessel occlusion with resultant organ, and/or nervous system infarction; and/or intrathecal granuloma formation with  possible spinal cord compression and permanent paralysis.  Before refilling the pump Mr. Cleland was informed that some of the medications used in the devise may not be FDA approved for such use and therefore it constitutes an off-label use of the medications.  Finally, he was informed that Medicine is not an exact science; therefore, there is also the possibility of unforeseen or unpredictable risks and/or possible complications that may result in a catastrophic  outcome. The patient indicated having understood very clearly. We have given the patient no guarantees and we have made no promises. Enough time was given to the patient to ask questions, all of which were answered to the patient's satisfaction. Mr. Kunkle has indicated that he wanted to continue with the procedure. Attestation: I, the ordering provider, attest that I have discussed with the patient the benefits, risks, side-effects, alternatives, likelihood of achieving goals, and potential problems during recovery for the procedure that I have provided informed consent. Date   Time: 05/18/2020 11:18 AM  Description of Procedure:          Position: Sitting  Description of the Procedure: The intrathecal pump was interrogated and analyzed.  We then proceeded to program it to deliver 5% less then what he is currently on.  Once that had been accomplished, the pump was again analyzed and the new program was printed out and provided to me for evaluation and confirmation.  After carefully having reviewed the printout, I have agreed with the new programming which reflects 5% less than what he had before.  The printout was signed and a copy given to the patient.  Vitals:   05/18/20 1116  BP: 105/66  Pulse: 94  Resp: 16  Temp: (!) 97.2 F (36.2 C)  TempSrc: Temporal  SpO2: 98%  Weight: 165 lb (74.8 kg)  Height: '5\' 7"'  (1.702 m)    Materials & Medications: N/A Medication(s): Please see chart orders for details.  Imaging Guidance:           Type of Imaging Technique: None used  Post-operative Assessment:  Post-procedure Vital Signs:  Pulse/HCG Rate: 94  Temp: (!) 97.2 F (36.2 C) Resp: 16 BP: 105/66 SpO2: 98 %  Complications: None.  Comments:  No additional relevant information.  Plan of Care  Orders:  Orders Placed This Encounter  Procedures   PUMP REPROGRAM    Follow programming protocol by having two(2) healthcare providers present during programming.    Scheduling Instructions:     Please perform the following adjustment: Decrease rate by 5%.    Order Specific Question:   Where will this procedure be performed?    Answer:   [facility]   PUMP REFILL    Whenever possible schedule on a procedure today.    Standing Status:   Future    Standing Expiration Date:   10/15/2020    Scheduling Instructions:     Please schedule intrathecal pump refill based on pump programming. Avoid schedule intervals of more than 120 days (4 months).    Order Specific Question:   Where will this procedure be performed?    Answer:   [facility]   Chronic Opioid Analgesic:  Intrathecal PF-Fentanyl 502.2 mcg/day (20.9 mcg/hr) MME/day: 50.16 mg/day.   Medications ordered for procedure: No orders of the defined types were placed in this encounter.  Medications administered: Lexine Baton "Rick" had no medications administered during this visit.  See the medical record for exact dosing, route, and time of administration.  Follow-up plan:   Return for scheduled encounter.       Interventional management options:  Considering:   Palliative intrathecal pump refills  No other procedures currently under consideration   Palliative PRN treatment(s):   Palliative management of intrathecal pump     Recent Visits Date Type Provider Dept  04/29/20 Procedure visit Milinda Pointer, MD [facility]  Showing recent visits within past 90 days and meeting all other requirements Today's  Visits Date  Type Provider Dept  05/18/20 Procedure visit Milinda Pointer, MD [facility]  Showing today's visits and meeting all other requirements Future Appointments Date Type Provider Dept  08/03/20 Appointment Milinda Pointer, MD [facility]  Showing future appointments within next 90 days and meeting all other requirements  Disposition: Discharge home  Discharge (Date   Time): 05/18/2020; 1147 hrs.   Primary Care Physician: Sofie Hartigan, MD Location: [facility] Note by: Gaspar Cola, MD Date: 05/18/2020; Time: 12:06 PM  Disclaimer:  Medicine is not an Chief Strategy Officer. The only guarantee in medicine is that nothing is guaranteed. It is important to note that the decision to proceed with this intervention was based on the information collected from the patient. The Data and conclusions were drawn from the patient's questionnaire, the interview, and the physical examination. Because the information was provided in large part by the patient, it cannot be guaranteed that it has not been purposely or unconsciously manipulated. Every effort has been made to obtain as much relevant data as possible for this evaluation. It is important to note that the conclusions that lead to this procedure are derived in large part from the available data. Always take into account that the treatment will also be dependent on availability of resources and existing treatment guidelines, considered by other Pain Management Practitioners as being common knowledge and practice, at the time of the intervention. For Medico-Legal purposes, it is also important to point out that variation in procedural techniques and pharmacological choices are the acceptable norm. The indications, contraindications, technique, and results of the above procedure should only be interpreted and judged by a Board-Certified Interventional Pain Specialist with extensive familiarity and expertise  in the same exact procedure and technique.

## 2020-05-19 ENCOUNTER — Telehealth: Payer: Self-pay | Admitting: *Deleted

## 2020-05-19 ENCOUNTER — Other Ambulatory Visit: Payer: Medicare Other

## 2020-05-19 ENCOUNTER — Other Ambulatory Visit: Payer: Self-pay

## 2020-05-19 DIAGNOSIS — N138 Other obstructive and reflux uropathy: Secondary | ICD-10-CM

## 2020-05-19 DIAGNOSIS — N401 Enlarged prostate with lower urinary tract symptoms: Secondary | ICD-10-CM

## 2020-05-19 NOTE — Telephone Encounter (Signed)
No problems after decrease in IT pump dose.

## 2020-05-20 LAB — PSA: Prostate Specific Ag, Serum: 1.2 ng/mL (ref 0.0–4.0)

## 2020-05-24 NOTE — Progress Notes (Signed)
05/25/2020 12:11 PM   Logan Perez 01/07/1946 [id number]  Referring provider: Sofie Hartigan, MD [address] [place],  [place] [address]  Chief Complaint  Patient presents with  . Benign Prostatic Hypertrophy    HPI: Patient is a 74 year old male who presents today for a one year follow up for BPH with LUTS.    BPH WITH LUTS His IPSS score today is 2, which is mild lower urinary tract symptomatology.  He is pleased with his quality life due to his urinary symptoms.   His previous I PSS score was 5/1.  He has weak stream and feelings of incomplete bladder emptying and urgency.  He denies any dysuria, hematuria or suprapubic pain.  He currently taking tamsulosin 0.4 mg daily.   He also denies any recent fevers, chills, nausea or vomiting.     IPSS    Row Name 05/25/20 1100         International Prostate Symptom Score   How often have you had the sensation of not emptying your bladder? Less than 1 in 5     How often have you had to urinate less than every two hours? Not at All     How often have you found you stopped and started again several times when you urinated? Not at All     How often have you found it difficult to postpone urination? Not at All     How often have you had a weak urinary stream? Not at All     How often have you had to strain to start urination? Not at All     How many times did you typically get up at night to urinate? 1 Time     Total IPSS Score 2       Quality of Life due to urinary symptoms   If you were to spend the rest of your life with your urinary condition just the way it is now how would you feel about that? Pleased            Score:  1-7 Mild 8-19 Moderate 20-35 Severe  He had an episode of chronic prostatitis back in 06/2019 for which he was seen by Thomas Hoff, PA-C.  Issues have resolved.    PMH: Past Medical History:  Diagnosis Date  . Anxiety   . Atrial fibrillation ([place])   . BPH (benign prostatic  hyperplasia)   . Complication of anesthesia    BAD HEADACHE NIGHT OF FIRST CATARACT  . Compression fracture of lumbar vertebra ([place])   . Difficulty voiding   . Dysrhythmia    A FIB  . Elevated PSA   . GERD (gastroesophageal reflux disease)   . Hearing aid worn    bilateral  . History of kidney stones   . HLD (hyperlipidemia)   . HOH (hard of hearing)   . Hypertension   . Multiple myeloma ([place])   . Neuropathy    feet. 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  . Pain    BACK  . Palpitations   . Pneumonia   . Pulmonary embolism ([place])   . Sepsis ([place])   . Stroke [facility])    TIA, detected on CT scan. pt was unaware    Surgical History: Past Surgical History:  Procedure Laterality Date  . BACK SURGERY  1994  . CATARACT EXTRACTION W/PHACO Right 05/02/2016   Procedure: CATARACT EXTRACTION PHACO AND INTRAOCULAR LENS PLACEMENT (IOC);  Surgeon: Birder Robson, MD;  Location:  [facility];  Service: Ophthalmology;  Laterality: Right;  [place] 1.06 AP% 20.6 CDE 13.70 FLUID PACK LOT # [id number] H  . CATARACT EXTRACTION W/PHACO Left 05/16/2016   Procedure: CATARACT EXTRACTION PHACO AND INTRAOCULAR LENS PLACEMENT (IOC);  Surgeon: Birder Robson, MD;  Location: [facility];  Service: Ophthalmology;  Laterality: Left;  [place] 01:43 AP% 19.8 CDE 20.45 FLUID PACK LOT #[id number] H  . COLONOSCOPY WITH PROPOFOL N/A 03/22/2017   Procedure: COLONOSCOPY WITH PROPOFOL;  Surgeon: Lucilla Lame, MD;  Location: [place];  Service: Endoscopy;  Laterality: N/A;  has port  . ESOPHAGOGASTRODUODENOSCOPY (EGD) WITH PROPOFOL  03/22/2017   Procedure: ESOPHAGOGASTRODUODENOSCOPY (EGD) WITH PROPOFOL;  Surgeon: Lucilla Lame, MD;  Location: [place];  Service: Endoscopy;;  . EYE SURGERY    . KNEE ARTHROSCOPY Left 1992  . LIMBAL STEM CELL TRANSPLANT    . PAIN PUMP IMPLANTATION  2012  . PAIN PUMP IMPLANTATION N/A 09/04/2017   Procedure: INTRATHECAL PUMP BATTERY CHANGE;  Surgeon: Milinda Pointer, MD;  Location: [facility];  Service:  Neurosurgery;  Laterality: N/A;  . PORTA CATH INSERTION N/A 12/04/2016   Procedure: Glori Luis Cath Insertion;  Surgeon: Algernon Huxley, MD;  Location: [facility] [facility];  Service: Cardiovascular;  Laterality: N/A;  . stem cell implant  2008   [facility]    Home Medications:  Allergies as of 05/25/2020      Reactions   Azithromycin Diarrhea, Other (See Comments)   Possible cause of C. Diff Possible cause of C. Diff Possible cause of C. Diff Possible cause of C. 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 If you have any questions, ask your nurse or doctor.        acetaminophen 500 MG tablet Commonly known as: TYLENOL Take 500 mg by mouth 2 (two) times a day.   ARTIFICIAL TEARS OP Place 1 drop as needed into both eyes (for dry eyes).   baclofen 10 MG tablet Commonly known as: LIORESAL Take 1 tablet (10 mg total) by mouth 2 (two) times daily.   bismuth subsalicylate 371 IR/67EL suspension Commonly known as: PEPTO BISMOL Take 30 mLs by mouth every 6 (six) hours as needed for diarrhea or loose stools.   calcium citrate-vitamin D 315-200 MG-UNIT tablet Commonly known as: CITRACAL+D Take 1 tablet by mouth 2 (two) times daily.   Calcium-Vitamin D 500-400 MG-UNIT Tabs Take 1 tablet daily by mouth.   cetirizine 10 MG tablet Commonly known as: ZYRTEC Take 10 mg by mouth daily as needed for allergies.   DARZALEX IV Inject 1,200 mg every 30 (thirty) days into the vein.   dexamethasone 4 MG tablet Commonly known as: DECADRON TAKE 4 TABLETS BY MOUTH 1 HOUR PRIOR TO INFUSION, THEN 1 TABLET ON DAYS 2 AND 3 AS DIRECTED   diltiazem 120 MG 24 hr capsule Commonly known as: CARDIZEM CD Take 120 mg  daily by mouth.   diltiazem 60 MG tablet Commonly known as: CARDIZEM Take 60 mg daily as needed by mouth (for increased heart rate > 140).   diphenhydrAMINE 25 MG tablet Commonly known as: BENADRYL Take 25 mg by mouth as directed. With chemo  treatment   Eliquis 5 MG Tabs tablet Generic drug: apixaban TAKE 1 TABLET(5 MG) BY MOUTH TWICE DAILY   fluticasone 50 MCG/ACT nasal spray Commonly known as: FLONASE Place 1 spray into the nose daily as needed for allergies.   loperamide 2 MG capsule Commonly known as: IMODIUM Take 4 mg as needed by mouth for diarrhea or loose stools.   loratadine 10 MG tablet Commonly known as: CLARITIN Take 10 mg by mouth daily as needed.   lovastatin 20 MG tablet Commonly known as: MEVACOR Take 20 mg by mouth every evening.   montelukast 10 MG tablet Commonly known as: SINGULAIR TAKE 1 TABLET THE DAY OF AND 2 DAYS AFTER INFUSION   MULTIVITAMIN/IRON PO Take 1 tablet daily by mouth.   NON FORMULARY IT pump  Fentanyl 1,500.0 mcg/ml Bupivicaine 30.0 mg/ml Clonidine 300.0 mcg/ml Rate 500.4 mg/day   omeprazole 20 MG capsule Commonly known as: PRILOSEC Take 20 mg by mouth daily.   ondansetron 8 MG tablet Commonly known as: ZOFRAN Take 1 tablet (8 mg total) by mouth as directed. Take with chemo and can take it as needed   potassium chloride SA 20 MEQ tablet Commonly known as: KLOR-CON TAKE 1 TABLET(20 MEQ) BY MOUTH DAILY   tamsulosin 0.4 MG Caps capsule Commonly known as: FLOMAX TAKE 1 CAPSULE(0.4 MG) BY MOUTH DAILY   valACYclovir 500 MG tablet Commonly known as: VALTREX TAKE 1 TABLET BY MOUTH EVERY DAY   vitamin B-12 1000 MCG tablet Commonly known as: CYANOCOBALAMIN Take 1,000 mcg daily by mouth.   Vitamin D3 50 MCG (2000 UT) capsule Take 2,000 Units daily by mouth.       Allergies:  Allergies  Allergen Reactions  . Azithromycin Diarrhea and Other (See Comments)    Possible cause of C. Diff Possible cause of C.  Diff Possible cause of C. Diff Possible cause of C. Diff  . Bee Pollen Other (See Comments)    Sneezing, watery eyes, runny nose  . Pollen Extract Other (See Comments)    Sneezing, watery eyes, runny nose  . 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 Rivaroxaban Rash    Family History: Family History  Problem Relation Age of Onset  . Cancer Father        throat  . Kidney disease Sister   . Stroke Other   . Stroke Mother   . Bladder Cancer Neg Hx   . Prostate cancer Neg Hx   . Kidney cancer Neg Hx     Social History:  reports that he quit smoking about 44 years ago. His smoking use included cigarettes. He has a 30.00 pack-year smoking history. He has never used smokeless tobacco. He reports that he does not drink alcohol and does not use drugs.  ROS: For pertinent review of systems please refer to history of present illness  Physical Exam: BP 103/67   Pulse (!) 101   Ht '5\' 7"'  (1.702 m)   Wt 165 lb (74.8 kg)   BMI 25.84 kg/m   Constitutional:  Well nourished. Alert and oriented, No acute distress. HEENT: [place] AT, moist mucus membranes.  Trachea midline Cardiovascular: No clubbing, cyanosis, or edema. Respiratory: Normal respiratory effort, no increased work of breathing. GI: Abdomen is soft, non tender, non distended, no abdominal masses. Liver and spleen not palpable.  No hernias appreciated.  Stool sample for occult testing is not indicated.   GU: No CVA tenderness.  No bladder fullness or masses.  Patient with circumcised phallus. Urethral meatus is patent.  No penile discharge. No penile lesions or rashes. Scrotum without lesions, cysts, rashes and/or edema.  Testicles are located scrotally bilaterally. They are atrophic.  No masses are appreciated in the testicles. Left and right epididymis are normal. Rectal: Patient with normal sphincter tone. Anus and perineum without scarring or rashes. No rectal masses are  appreciated. Prostate is approximately 45 grams, could only palpate the apex and the midportion of the gland, no nodules are appreciated. Seminal vesicles could not be palpated Skin: No rashes, bruises or suspicious lesions. Lymph: No inguinal adenopathy. Neurologic: Grossly intact, no focal deficits, moving all 4 extremities. Psychiatric: Normal mood and affect.   Laboratory Data: Lab Results  Component Value Date   WBC 9.8 05/05/2020   HGB 12.6 (L) 05/05/2020   HCT 37.0 (L) 05/05/2020   MCV 102.2 (H) 05/05/2020   PLT 190 05/05/2020    Lab Results  Component Value Date   CREATININE 1.48 (H) 05/05/2020    PSA History: 2.9 ng/mL on 04/30/2012 1.9 ng/mL on 05/01/2013 1.5 ng/mL on 04/30/2014 0.9 ng/mL in 04/2018 1.1 ng/mL in 04/2019 Component     Latest Ref Rng & Units 05/01/2016 05/02/2018 05/05/2019 05/19/2020  Prostate Specific Ag, Serum     0.0 - 4.0 ng/mL 0.9 0.9 1.1 1.2    Lab Results  Component Value Date   AST 23 05/05/2020   Lab Results  Component Value Date   ALT 15 05/05/2020   I have reviewed the labs   Assessment & Plan:    1. BPH with LUTS IPSS score is 2/1, it is impoved Continue conservative management, avoiding bladder irritants Most bothersome symptom is weak stream - he is not wanting to pursue further treatments at this time Continue tamsulosin 0.4 mg daily RTC in 12 months for IPSS and exam   Return in about 1 year (around 05/25/2021) for IPSS, PSA and exam.  These notes generated with voice recognition software. I apologize for typographical errors.  Zara Council, PA-C  [facility] [address] [place], [place] [address] [phone]

## 2020-05-25 ENCOUNTER — Ambulatory Visit: Payer: Medicare Other | Admitting: Urology

## 2020-05-25 ENCOUNTER — Encounter: Payer: Self-pay | Admitting: Urology

## 2020-05-25 ENCOUNTER — Other Ambulatory Visit: Payer: Self-pay

## 2020-05-25 ENCOUNTER — Ambulatory Visit (INDEPENDENT_AMBULATORY_CARE_PROVIDER_SITE_OTHER): Payer: Medicare Other | Admitting: Urology

## 2020-05-25 VITALS — BP 103/67 | HR 101 | Ht 67.0 in | Wt 165.0 lb

## 2020-05-25 DIAGNOSIS — N401 Enlarged prostate with lower urinary tract symptoms: Secondary | ICD-10-CM

## 2020-05-25 DIAGNOSIS — N138 Other obstructive and reflux uropathy: Secondary | ICD-10-CM | POA: Diagnosis not present

## 2020-06-07 ENCOUNTER — Inpatient Hospital Stay: Payer: Medicare Other | Attending: Hematology and Oncology

## 2020-06-07 ENCOUNTER — Telehealth: Payer: Self-pay

## 2020-06-07 ENCOUNTER — Other Ambulatory Visit: Payer: Self-pay

## 2020-06-07 ENCOUNTER — Inpatient Hospital Stay: Payer: Medicare Other

## 2020-06-07 ENCOUNTER — Other Ambulatory Visit: Payer: Self-pay | Admitting: Hematology and Oncology

## 2020-06-07 VITALS — BP 124/70 | HR 90 | Temp 97.4°F | Resp 18

## 2020-06-07 DIAGNOSIS — Z5112 Encounter for antineoplastic immunotherapy: Secondary | ICD-10-CM | POA: Insufficient documentation

## 2020-06-07 DIAGNOSIS — C9000 Multiple myeloma not having achieved remission: Secondary | ICD-10-CM

## 2020-06-07 DIAGNOSIS — Z79899 Other long term (current) drug therapy: Secondary | ICD-10-CM | POA: Insufficient documentation

## 2020-06-07 DIAGNOSIS — C9002 Multiple myeloma in relapse: Secondary | ICD-10-CM | POA: Diagnosis not present

## 2020-06-07 LAB — COMPREHENSIVE METABOLIC PANEL
ALT: 14 U/L (ref 0–44)
AST: 17 U/L (ref 15–41)
Albumin: 4.1 g/dL (ref 3.5–5.0)
Alkaline Phosphatase: 46 U/L (ref 38–126)
Anion gap: 11 (ref 5–15)
BUN: 21 mg/dL (ref 8–23)
CO2: 24 mmol/L (ref 22–32)
Calcium: 9.2 mg/dL (ref 8.9–10.3)
Chloride: 102 mmol/L (ref 98–111)
Creatinine, Ser: 1.53 mg/dL — ABNORMAL HIGH (ref 0.61–1.24)
GFR calc Af Amer: 51 mL/min — ABNORMAL LOW (ref >60)
GFR calc non Af Amer: 44 mL/min — ABNORMAL LOW (ref >60)
Glucose, Bld: 85 mg/dL (ref 70–99)
Potassium: 5.2 mmol/L — ABNORMAL HIGH (ref 3.5–5.1)
Sodium: 137 mmol/L (ref 135–145)
Total Bilirubin: 0.5 mg/dL (ref 0.3–1.2)
Total Protein: 6.5 g/dL (ref 6.5–8.1)

## 2020-06-07 LAB — CBC WITH DIFFERENTIAL/PLATELET
Abs Immature Granulocytes: 0.04 10*3/uL (ref 0.00–0.07)
Basophils Absolute: 0 10*3/uL (ref 0.0–0.1)
Basophils Relative: 0 %
Eosinophils Absolute: 0.1 10*3/uL (ref 0.0–0.5)
Eosinophils Relative: 1 %
HCT: 38.1 % — ABNORMAL LOW (ref 39.0–52.0)
Hemoglobin: 13.1 g/dL (ref 13.0–17.0)
Immature Granulocytes: 0 %
Lymphocytes Relative: 13 %
Lymphs Abs: 1.2 10*3/uL (ref 0.7–4.0)
MCH: 34.8 pg — ABNORMAL HIGH (ref 26.0–34.0)
MCHC: 34.4 g/dL (ref 30.0–36.0)
MCV: 101.3 fL — ABNORMAL HIGH (ref 80.0–100.0)
Monocytes Absolute: 0.4 10*3/uL (ref 0.1–1.0)
Monocytes Relative: 5 %
Neutro Abs: 7.3 10*3/uL (ref 1.7–7.7)
Neutrophils Relative %: 81 %
Platelets: 235 10*3/uL (ref 150–400)
RBC: 3.76 MIL/uL — ABNORMAL LOW (ref 4.22–5.81)
RDW: 12.8 % (ref 11.5–15.5)
WBC: 9 10*3/uL (ref 4.0–10.5)
nRBC: 0 % (ref 0.0–0.2)

## 2020-06-07 LAB — MAGNESIUM: Magnesium: 2.1 mg/dL (ref 1.7–2.4)

## 2020-06-07 MED ORDER — HEPARIN SOD (PORK) LOCK FLUSH 100 UNIT/ML IV SOLN
500.0000 [IU] | Freq: Once | INTRAVENOUS | Status: AC | PRN
  Administered 2020-06-07: 500 [IU]
  Filled 2020-06-07: qty 5

## 2020-06-07 MED ORDER — SODIUM CHLORIDE 0.9 % IV SOLN
16.0000 mg/kg | Freq: Once | INTRAVENOUS | Status: AC
  Administered 2020-06-07: 1200 mg via INTRAVENOUS
  Filled 2020-06-07: qty 60

## 2020-06-07 MED ORDER — SODIUM CHLORIDE 0.9 % IV SOLN
Freq: Once | INTRAVENOUS | Status: AC
  Filled 2020-06-07: qty 250

## 2020-06-07 MED ORDER — DEXAMETHASONE 4 MG PO TABS: ORAL_TABLET | ORAL | 0 refills | Status: DC

## 2020-06-07 NOTE — Telephone Encounter (Signed)
The patient was informed while in office today to stop taking potassium at this time. The patient was understanding and agreeable.

## 2020-06-08 LAB — PROTEIN ELECTROPHORESIS, SERUM
A/G Ratio: 1.3 (ref 0.7–1.7)
Albumin ELP: 3.5 g/dL (ref 2.9–4.4)
Alpha-1-Globulin: 0.2 g/dL (ref 0.0–0.4)
Alpha-2-Globulin: 0.9 g/dL (ref 0.4–1.0)
Beta Globulin: 1 g/dL (ref 0.7–1.3)
Gamma Globulin: 0.7 g/dL (ref 0.4–1.8)
Globulin, Total: 2.8 g/dL (ref 2.2–3.9)
M-Spike, %: 0.4 g/dL — ABNORMAL HIGH
Total Protein ELP: 6.3 g/dL (ref 6.0–8.5)

## 2020-06-25 ENCOUNTER — Telehealth: Payer: Self-pay | Admitting: Pain Medicine

## 2020-06-25 NOTE — Telephone Encounter (Signed)
Spoke with patient.  States he has a hematoma on his back from slipping off bed and hitting bedframe.  States he has an appointment with his PCP this afternoon.  States he does not think he injured the IT pump because there has been no change in his pain.  Instructed to notify us if he has any concerns.

## 2020-06-25 NOTE — Telephone Encounter (Signed)
Patient lvmail stating he fell and injured his back a little. Would like to speak with someone about this.

## 2020-07-05 NOTE — Progress Notes (Signed)
[facility]  [address] [place], [place] [address] Phone: [phone]  Fax: [phone]   Clinic Day:  07/06/2020  Referring physician: Sofie Hartigan, MD  Chief Complaint: Logan Perez is a 74 y.o. 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   HPI: The patient was last seen in the medical oncology clinic on 05/05/2020. At that time, he was doing well.  Neuropathy was stable.  He had chronic diarrhea which decreased with steroids.  Exam was stable.  Hematocrit was 37.0, hemoglobin 12.6, MCV 102.2, platelets 190,000, WBC 9,800. Creatinine was 1.48 (CrCl 46 ml/min). Calcium was 8.7. There was no M-spike; pattern reflected  hypogammaglobulinemia. Kappa free light chain were 7.0, lambda free light chain 3.3, and ratio 2.12. He received cycle #44 daratumumab. He continued Eliquis.  Labs on 06/07/2020 revealed a hematocrit of 38.1, hemoglobin 13.1, MCV 101.3, platelets 235,000, WBC 9,000. Creatinine was 1.53 (CrCl 44 ml/min). M spike was 0.4 g/dL. He received cycle #45 daratumumab.  During the interim, he has been good. He had a dizzy spell one night last week after standing up from petting his dog. He took his blood pressure right after the episode and it was 96/68. He had eaten a lot of sodium that day, which he thinks may have caused his dizziness. He drank a couple of glasses of water and felt better.  The patient denies infection concerns. He reports chronic lower back pain and occasional palpitations. He takes Cardizem when he has palpitations, which help. His cough in the morning has resolved and his diarrhea has been under control. He states that Decadron causes him to have constipation. His neuropathy has been stable on Baclofen once daily.  He took Benadryl, Ondansetron, and Tylenol before his appointment.   Past Medical History:  Diagnosis  Date  . Anxiety   . Atrial fibrillation ([place])   . BPH (benign prostatic hyperplasia)   . Complication of anesthesia    BAD HEADACHE NIGHT OF FIRST CATARACT  . Compression fracture of lumbar vertebra ([place])   . Difficulty voiding   . Dysrhythmia    A FIB  . Elevated PSA   . GERD (gastroesophageal reflux disease)   . Hearing aid worn    bilateral  . History of kidney stones   . HLD (hyperlipidemia)   . HOH (hard of hearing)   . Hypertension   . Multiple myeloma ([place])   . Neuropathy    feet. 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  . Pain    BACK  . Palpitations   . Pneumonia   . Pulmonary embolism ([place])   . Sepsis ([place])   . Stroke [facility])    TIA, detected on CT scan. pt was unaware    Past Surgical History:  Procedure Laterality Date  . BACK SURGERY  1994  . CATARACT EXTRACTION W/PHACO Right 05/02/2016   Procedure: CATARACT EXTRACTION PHACO AND INTRAOCULAR LENS PLACEMENT (IOC);  Surgeon: Birder Robson, MD;  Location: [facility] ORS;  Service: Ophthalmology;  Laterality: Right;  [place] 1.06 AP% 20.6 CDE 13.70 FLUID PACK LOT # [id number]  . CATARACT EXTRACTION W/PHACO Left 05/16/2016   Procedure: CATARACT EXTRACTION PHACO AND INTRAOCULAR LENS PLACEMENT (IOC);  Surgeon: Birder Robson, MD;  Location: [facility] ORS;  Service: Ophthalmology;  Laterality: Left;  [place] 01:43 AP% 19.8 CDE 20.45 FLUID PACK LOT #[id number]  . COLONOSCOPY WITH PROPOFOL N/A 03/22/2017   Procedure: COLONOSCOPY WITH PROPOFOL;  Surgeon: Lucilla Lame, MD;  Location: [facility];  Service: Endoscopy;  Laterality: N/A;  has port  . ESOPHAGOGASTRODUODENOSCOPY (EGD) WITH PROPOFOL  03/22/2017   Procedure: ESOPHAGOGASTRODUODENOSCOPY (EGD) WITH PROPOFOL;  Surgeon: Lucilla Lame, MD;  Location: [facility];  Service: Endoscopy;;  . EYE SURGERY    . KNEE ARTHROSCOPY Left 1992  . LIMBAL STEM CELL TRANSPLANT    . PAIN PUMP IMPLANTATION  2012  . PAIN PUMP IMPLANTATION N/A 09/04/2017   Procedure: INTRATHECAL PUMP BATTERY CHANGE;  Surgeon:  Milinda Pointer, MD;  Location: [facility] OR[facility];  Service: Neurosurgery;  Laterality: N/A;  . PORTA CATH INSERTION N/A 12/04/2016   Procedure: Glori Luis Cath Insertion;  Surgeon: Algernon Huxley, MD;  Location: [place] [facility];  Service: Cardiovascular;  Laterality: N/A;  . stem cell implant  2008   [facility]    Family History  Problem Relation Age of Onset  . Cancer Father        throat  . Kidney disease Sister   . Stroke Other   . Stroke Mother   . Bladder Cancer Neg Hx   . Prostate cancer Neg Hx   . Kidney cancer Neg Hx     Social History:  reports that he quit smoking about 44 years ago. His smoking use included cigarettes. He has a 30.00 pack-year smoking history. He has never used smokeless tobacco. He reports that he does not drink alcohol and does not use drugs. He has a wire haired dachshund. He lives in [place]. His wife's name is [name]. The patient is alone today.  Allergies:  Allergies  Allergen Reactions  . Azithromycin Diarrhea and Other (See Comments)    Possible cause of C. Diff Possible cause of C. Diff Possible cause of C. Diff Possible cause of C. Diff  . Bee Pollen Other (See Comments)    Sneezing, watery eyes, runny nose  . Pollen Extract Other (See Comments)    Sneezing, watery eyes, runny nose  . 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 Rivaroxaban Rash    Current Medications: Current Outpatient Medications  Medication Sig Dispense Refill  . acetaminophen (TYLENOL) 500 MG tablet Take 500 mg by mouth 2 (two) times a day.    . baclofen (LIORESAL) 10 MG tablet Take 1 tablet (10 mg total) by mouth 2 (two) times daily. 180 tablet 3  . bismuth subsalicylate (PEPTO BISMOL) 262 MG/15ML suspension Take 30 mLs by mouth every 6 (six) hours as needed for diarrhea or loose stools.    . Calcium Carb-Cholecalciferol (CALCIUM-VITAMIN D) 500-400 MG-UNIT TABS Take 1 tablet daily by mouth.    . calcium citrate-vitamin D  (CITRACAL+D) 315-200 MG-UNIT tablet Take 1 tablet by mouth 2 (two) times daily.    . cetirizine (ZYRTEC) 10 MG tablet Take 10 mg by mouth daily as needed for allergies.     . Cholecalciferol (VITAMIN D3) 2000 units capsule Take 2,000 Units daily by mouth.     . Daratumumab (DARZALEX IV) Inject 1,200 mg every 30 (thirty) days into the vein.     [name] dexamethasone (DECADRON) 4 MG tablet TAKE 4 TABLETS BY MOUTH 1 HOUR PRIOR TO INFUSION, THEN 1 TABLET ON DAYS 2 AND 3 AS DIRECTED 30 tablet 0  . diltiazem (CARDIZEM CD) 120 MG 24 hr capsule Take 120 mg daily by mouth.     . diltiazem (CARDIZEM) 60 MG tablet Take 60 mg daily as needed by mouth (  for increased heart rate > 140).     [name] diphenhydrAMINE (BENADRYL) 25 MG tablet Take 25 mg by mouth as directed. With chemo treatment    . ELIQUIS 5 MG TABS tablet TAKE 1 TABLET(5 MG) BY MOUTH TWICE DAILY 60 tablet 0  . fluticasone (FLONASE) 50 MCG/ACT nasal spray Place 1 spray into the nose daily as needed for allergies.     . Hypromellose (ARTIFICIAL TEARS OP) Place 1 drop as needed into both eyes (for dry eyes).    [name] loperamide (IMODIUM) 2 MG capsule Take 4 mg as needed by mouth for diarrhea or loose stools.     [name] loratadine (CLARITIN) 10 MG tablet Take 10 mg by mouth daily as needed.     . lovastatin (MEVACOR) 20 MG tablet Take 20 mg by mouth every evening.     . montelukast (SINGULAIR) 10 MG tablet TAKE 1 TABLET THE DAY OF AND 2 DAYS AFTER INFUSION 90 tablet 1  . NON FORMULARY IT pump  Fentanyl 1,500.0 mcg/ml Bupivicaine 30.0 mg/ml Clonidine 300.0 mcg/ml Rate 500.4 mg/day    . omeprazole (PRILOSEC) 20 MG capsule Take 20 mg by mouth daily.     . ondansetron (ZOFRAN) 8 MG tablet Take 1 tablet (8 mg total) by mouth as directed. Take with chemo and can take it as needed 20 tablet 1  . tamsulosin (FLOMAX) 0.4 MG CAPS capsule TAKE 1 CAPSULE(0.4 MG) BY MOUTH DAILY 90 capsule 4  . valACYclovir (VALTREX) 500 MG tablet TAKE 1 TABLET BY MOUTH EVERY DAY 30 tablet 3  .  vitamin B-12 (CYANOCOBALAMIN) 1000 MCG tablet Take 1,000 mcg daily by mouth.     . Multiple Vitamins-Iron (MULTIVITAMIN/IRON PO) Take 1 tablet daily by mouth. (Patient not taking: Reported on 07/06/2020)    . potassium chloride SA (KLOR-CON) 20 MEQ tablet TAKE 1 TABLET(20 MEQ) BY MOUTH DAILY (Patient not taking: Reported on 07/06/2020) 90 tablet 0   No current facility-administered medications for this visit.    Review of Systems  Constitutional: Positive for weight loss (3 lbs). Negative for chills, diaphoresis, fever and malaise/fatigue.  HENT: Positive for hearing loss (hearing aid). Negative for congestion, ear discharge, ear pain, nosebleeds, sinus pain, sore throat and tinnitus.   Eyes: Negative.  Negative for blurred vision, double vision, photophobia and pain.  Respiratory: Negative.  Negative for cough, hemoptysis, sputum production, shortness of breath and wheezing.   Cardiovascular: Positive for palpitations (occasional). Negative for chest pain, orthopnea, leg swelling and PND.       H/o atrial fibrillation.  Gastrointestinal: Negative for abdominal pain, blood in stool, constipation, diarrhea (on Imodium), heartburn, melena, nausea and vomiting.  Genitourinary: Negative.  Negative for dysuria, frequency, hematuria and urgency.  Musculoskeletal: Positive for back pain (chronic; Fentanyl/bupivacaine/clonidine pump; better with tylenol). Negative for falls, joint pain, myalgias and neck pain.  Skin: Negative.  Negative for itching and rash.  Neurological: Positive for dizziness (one episode last week) and sensory change (restless legs; neuropathy in feet). Negative for tingling, tremors, speech change, focal weakness, seizures, weakness and headaches.  Endo/Heme/Allergies: Negative.  Negative for environmental allergies. Does not bruise/bleed easily.  Psychiatric/Behavioral: Negative.  Negative for depression and memory loss. The patient is not nervous/anxious and does not have insomnia.    All other systems reviewed and are negative.  Performance status (ECOG): 1  Vitals Blood pressure 133/84, pulse 76, temperature (!) 96 F (35.6 C), temperature source Tympanic, resp. rate 18, weight 164 lb 0.4 oz (74.4 kg), SpO2 100 %.  Physical Exam Vitals and nursing note reviewed.  Constitutional:      General: He is not in acute distress.    Appearance: He is well-developed. He is not diaphoretic.  HENT:     Head: Normocephalic and atraumatic.     Comments: Short brown hair.    Mouth/Throat:     Pharynx: No oropharyngeal exudate.  Eyes:     General: No scleral icterus.    Conjunctiva/sclera: Conjunctivae normal.     Pupils: Pupils are equal, round, and reactive to light.     Comments: Glasses. Blue eyes.  Neck:     Vascular: No JVD.  Cardiovascular:     Rate and Rhythm: Normal rate and regular rhythm.     Heart sounds: Normal heart sounds. No murmur heard.  No gallop.   Pulmonary:     Effort: Pulmonary effort is normal. No respiratory distress.     Breath sounds: Normal breath sounds. No wheezing or rales.  Abdominal:     General: Bowel sounds are normal. There is no distension.     Palpations: Abdomen is soft. There is no hepatomegaly, splenomegaly or mass.     Tenderness: There is no abdominal tenderness. There is no guarding or rebound.  Musculoskeletal:        General: No tenderness. Normal range of motion.     Cervical back: Normal range of motion and neck supple.  Lymphadenopathy:     Head:     Right side of head: No preauricular, posterior auricular or occipital adenopathy.     Left side of head: No preauricular, posterior auricular or occipital adenopathy.     Cervical: No cervical adenopathy.     Upper Body:     Right upper body: No supraclavicular or axillary adenopathy.     Left upper body: No supraclavicular or axillary adenopathy.     Lower Body: No right inguinal adenopathy. No left inguinal adenopathy.  Skin:    General: Skin is warm and dry.      Coloration: Skin is not pale.     Findings: No erythema or rash.  Neurological:     Mental Status: He is alert and oriented to person, place, and time.  Psychiatric:        Behavior: Behavior normal.        Thought Content: Thought content normal.        Judgment: Judgment normal.    Infusion on 07/06/2020  Component Date Value Ref Range Status  . Magnesium 07/06/2020 1.8  1.7 - 2.4 mg/dL Final   Performed at [facility], [address] [place], [place] [address]  . Sodium 07/06/2020 137  135 - 145 mmol/L Final  . Potassium 07/06/2020 3.8  3.5 - 5.1 mmol/L Final  . Chloride 07/06/2020 104  98 - 111 mmol/L Final  . CO2 07/06/2020 25  22 - 32 mmol/L Final  . Glucose, Bld 07/06/2020 103* 70 - 99 mg/dL Final   Glucose reference range applies only to samples taken after fasting for at least 8 hours.  . BUN 07/06/2020 17  8 - 23 mg/dL Final  . Creatinine, Ser 07/06/2020 1.17  0.61 - 1.24 mg/dL Final  . Calcium 07/06/2020 8.8* 8.9 - 10.3 mg/dL Final  . Total Protein 07/06/2020 6.4* 6.5 - 8.1 g/dL Final  . Albumin 07/06/2020 4.0  3.5 - 5.0 g/dL Final  . AST 07/06/2020 20  15 - 41 U/L Final  . ALT 07/06/2020 16  0 - 44 U/L Final  .  Alkaline Phosphatase 07/06/2020 38  38 - 126 U/L Final  . Total Bilirubin 07/06/2020 0.7  0.3 - 1.2 mg/dL Final  . GFR calc non Af Amer 07/06/2020 >60  >60 mL/min Final  . GFR calc Af Amer 07/06/2020 >60  >60 mL/min Final  . Anion gap 07/06/2020 8  5 - 15 Final   Performed at [facility], [address], [place], [place] [address]  . WBC 07/06/2020 7.6  4.0 - 10.5 K/uL Final  . RBC 07/06/2020 3.54* 4.22 - 5.81 MIL/uL Final  . Hemoglobin 07/06/2020 12.5* 13.0 - 17.0 g/dL Final  . HCT 07/06/2020 35.4* 39 - 52 % Final  . MCV 07/06/2020 100.0  80.0 - 100.0 fL Final  . MCH 07/06/2020 35.3* 26.0 - 34.0 pg Final  . MCHC 07/06/2020 35.3  30.0 - 36.0 g/dL Final  . RDW 07/06/2020 13.2  11.5 - 15.5 % Final  . Platelets 07/06/2020 202  150  - 400 K/uL Final  . nRBC 07/06/2020 0.0  0.0 - 0.2 % Final  . Neutrophils Relative % 07/06/2020 60  % Final  . Neutro Abs 07/06/2020 4.7  1.7 - 7.7 K/uL Final  . Lymphocytes Relative 07/06/2020 27  % Final  . Lymphs Abs 07/06/2020 2.0  0.7 - 4.0 K/uL Final  . Monocytes Relative 07/06/2020 10  % Final  . Monocytes Absolute 07/06/2020 0.7  0 - 1 K/uL Final  . Eosinophils Relative 07/06/2020 2  % Final  . Eosinophils Absolute 07/06/2020 0.1  0 - 0 K/uL Final  . Basophils Relative 07/06/2020 1  % Final  . Basophils Absolute 07/06/2020 0.0  0 - 0 K/uL Final  . Immature Granulocytes 07/06/2020 0  % Final  . Abs Immature Granulocytes 07/06/2020 0.02  0.00 - 0.07 K/uL Final   Performed at [facility], [address], [place], [place] [address]    Assessment:  Logan Perez is a 74 y.o. male with stage III mutiple myeloma. He initially presented with progressive back pain beginning in 12/2006. MRI revealed "spots and compression fractures". He began Velcade, thalidomide, and Decadron. In 08/2007, he underwent high dose chemotherapy and autologous stem cell transplant. 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  He recurred with a rising M-spike (2.7) with repeat M spike (1.7 gm/dl) in 03/2010. He was initially treated with Velcade (02/08/2010 - 05/10/2010). He then began Revlimid (15 mg 3 weeks on/1 week off) and Decadron (40 mg on day 1, 8, 15, 22). Because of significant side effect with Decadron his dose was decreased to 10 mg once a week in 07/2010.   He was on maintenance Revlimid. Revlimid was initially10 mg 3 weeks on/1 week off. This was changed to 10 mg 2 weeks on/2 weeks off secondary to right nipple tenderness. His dose was increased to 10 mg 3 weeks on/1 week off with Decadron 10 mg a week (on Sundays) and then Revlamid 15 mg 3 weeks on and 1 week off with Decadron on Sundays. He began Pomalyst4 mg 3 weeks on/1 week off with Decadron  on 08/27/2015.  SPEPrevealed no monoclonal protein (04/21/2015) and 0.5 gm/dL on 09/22/2015 and 10/20/2015. M spikewas 0.1 on 02/02/2016, 03/01/2016, 03/29/2016, 04/26/2016, and 05/24/2016. M spikewas 0 on 10/11/2016, 11/28/2016, 02/05/2017, 03/21/2017, 08/02/2017, 10/04/2017, 12/27/2017, 01/24/2018, and 02/21/2018. M spikewas 0.1 on 11/01/2017, 0.1 on 11/29/2017, 0 on 12/27/2017, 0 on 01/24/2018, 0 on 02/21/2018, 0.1 on 03/21/2018, 0.3 on 04/18/2018, 0.1 on 05/16/2018, 0 on 06/13/2018, 0.2 on 08/08/2018,  0 on 09/09/2018, 0.1 on 11/04/2018, 0 on 12/02/2018, 0.1 on 12/30/2018, 0 on 01/27/2019, 0.1 on 02/24/2019, 0.1 on 03/24/2019, 0.1 on 04/23/2019, 0 on 05/19/2019, 0.1 on 06/17/2019, 0.2 on 07/15/2019, 0.2 on 08/18/2019, 0.2 on 09/15/2019, 0.1 on 10/13/2019, 0.1 on 11/13/2019, 0 on 12/11/2019, 0.3 on 01/08/2020, 0.2 on 02/05/2020, 0.2 on 03/04/2020, 0.2 on 04/01/2020, 0 on 05/05/2020, 0.4 on 06/07/2020, and is 0.4 on 07/06/2020.  Free light chainshave been monitored. Kappa free light chainswere 18.54 on 11/21/2013, 18.37 on 02/20/2014, 18.93 on 05/22/2014, 32.58 (high; normal ratio 1.27) on 08/21/2014, 51.53 (high; elevated ratio of 2.12) on 11/13/2014, 28.08 (ratio 1.73) on 12/09/2014, 23.71 (ratio 2.17) on 01/18/2015, 92.93 (ratio 9.49) on 04/21/2015, 93.44 (ratio 10.28) on 05/26/2015, 255.45 (ratio of 24.05) on 07/14/2015, 373.89 (ratio 48.31) on 08/04/2015, 474.33 (ratio 70.58) on 08/25/2015, 450.76 (ratio 55.44) on 09/22/2015, 453.4 (ratio 59.89) on 10/20/2015, 58.26 (ratio of >40.74) on 02/02/2016, 62.67 (ratio of 37.98) on 03/01/2016, 75.7 (ratio >50.47) on 03/29/2016, 79.7 (ratio 53.13) on 04/26/2016, 108.6 (ratio >72.4) on 05/24/2016, 8.3 (1.46 ratio) on 10/11/2016, 6.7 (0.81 ratio) on 02/05/2017, 7.8 (1.01 ratio) on 03/21/2017,7.6 (ratio 0.63) on 06/01/2017, 8.1 (ratio 1.13) on 11/29/2017, 9.6 (ratio 1.55) on 06/13/2018, 6.6 (ratio 2.06) on 07/11/2018, 6.9 (ratio 0.82) on 08/08/2018,  5.8 (ratio 1.66) on 09/09/2018, 5.8 (ratio 1.05) on 11/04/2018, 5.6(ratio 2.55)on 12/02/2018, 5.5 (ratio 1.96) on 01/27/2019, 7.6 (ratio 2.38) on 06/17/2019, 6.9 (ratio 1.21) on 11/13/2019, 6.6 (ratio 2.0) on 12/11/2019, 7.9 (ratio 2.14) on 02/05/2020, 6.6 (ratio 2.28) on 04/01/2020, 7.0 (ratio 2.12) on 05/05/2020, and 9.3 (ratio 2.16) on 07/06/2020.  Bone surveyon 12/08/2014 was stable. Bone survey on 10/21/2015 revealed increase conspicuity of subcentimeter lytic lesions in the calvarium.  PET scan on 08/14/2018 revealed no evidence of active myeloma.  Bone survey on 11/04/2019 revealed stable small lytic lesions throughout the skull.  There were no other lytic lesions.  Bone marrow aspirate and biopsyon 11/04/2015 revealed an atypical monoclonal plasma cells estimated at 30-40% of marrow cells. Marrow was variably cellular (approximately 45%) with background trilineage hematopoiesis. There was no significant increase in marrow reticulin fibers. Storage iron was present.   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 He develoed herpes zoster in 04/2008. He developed a pulmonary embolismin 05/2013. He was initially on Xarelto, but is now on Eliquis. He had an episode of pneumonia around this time requiring a brief admission. He developed severe lower leg cramps on 08/07/2014 secondary to hypokalemia. Duplex was negative.   He was treated forC difficile colitis(Flagyl completed 07/30/2015). He has a chronic indwelling pain pump.  He received 4 cycles of Pomalyst and Decadron(08/27/2015 - 11/19/2015). Restaging studies document progressive disease. Kappa free light chains are increasing. SPEP revealed 0.5 gm/dL monoclonal protein then 1.3 gm/dL. Bone survey reveals increase conspicuity of subcentimeter lytic lesions in the calvarium. Bone marrow reveals 30-40% plasma cells.   MUGAon 11/30/2015 revealed an ejection  fraction of 46%. He is felt not to be a good candidate for Kyprolis. There were no focal wall motion abnormalities. He had a stress echo less than 1 year ago. He has a history of PVCs and atrial fibrillation. He takes Cardizem prn.  He received 17 weeks of daratumumab(Darzalex) (12/09/2015 - 05/25/2016). He tolerated treatment well without side effect.  Bone marrow on 02/09/2016 revealed no diagnostic morphologic evidence of plasma cell myeloma. Marrow was normocellular to hypocellular marrow for age (ranging from 10-40%) with maturing trilineage hematopoiesis and mild multilineage dyspoiesis. There was  patchy mild increase in reticulin. Storage iron was present. Flow cytometry revealed no definitive evidence of monoclonality. There was a non-specific atypical myeloid and monocytic findings with no increase in blasts. Cytogenetics were normal (46, XY).  He is currently2month s/p 2nd autologous stem cell transplanton 06/16/2016. 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 He also experienced atrial fibrillation with intermittent episodes of RVR requiring IV beta blockers. He is on prophylactic valacyclovir for 1 year post transplant.  He started vaccinations(DTaP-Pediatric triple vaccine, Hep B- Pediatrix triple vaccine, Haemophilus influenza B (Hib), inactivated polio virus (IPV), pneumococcal conjugate vaccine 13-valent (PCV 13)) on 04/17/2017. Recommendation was for Shingrix vaccine to be given [place] dose of Shingrix in 2 months. He had follow-up vaccinations on 08/28/2017. He had his second shingles vaccine. He received 18 month vaccinations- DTaP-Pediatric triple vaccine, Hep B- Pediatrix triple vaccine, Haemophilus influenza B (Hib), inactivated polio virus (IPV), pneumococcal conjugate vaccine 13-valent (PCV 13) on 02/05/2018.  He is s/p45cycles ofdaratumumab(Darzalex) post transplant(12/05/2016  -06/07/2020).  PET scanon 08/14/2018 revealed no evidence of active myeloma on whole-body FDG PET scan. There was no evidence soft tissue plasmacytoma. There was no clear evidence of lytic lesions on the CT portion exam. Some lucencies in the pelvis were stable. There were chronic compression fractures in the lower thoracic spine.  Bone survey on 11/04/2019 revealed stable small lytic lesions noted throughout the skull.  There were no other lytic lesions. Exam was stable.   He has a history of hypomagnesemiathat required IV magnesium (2-4 gm) weekly. He has not required magnesium since 10/24/2016. Patient has atrial fibrillationand is on Eliquis.  Symptomatically, he is doing well.  He denies any interval infections.  Exam is stable.  M-spike is 0.4 gm/dL.  Plan: 1.   Labs today: CBC with diff, CMP, Mg, SPEP, FLCA. 2.Multiple myeloma Clinically, he is doing well M spike fluctuates between 0-0.2 gm/dL. M-spike has been 0.4 gm/dL in the past 2 months. PET scan on 08/14/2018 revealed no evidence of active myeloma. Bone survey on 11/04/2019 revealed stable small lytic lesions in the skull.             No new lesions. Labs reviewed.  Cycle #46 daratumumab today.    Confirm patient took his premeds at home (Tylenol, Benadryl, ondansetron, Singulair and Decadron).   Continue every other cycle visits. Continue prophylactic valacyclovir (until 3 months s/pdaratumumab). He is no longer followed in the transplant center. [facility] Discuss symptom management.  He has antiemeticsat home to use on a prn bases.  Interventions are adequate.   .         3. Macrocytic anemia Hematocrit35.4. Hemoglobin12.5. MCV100.0. B12 and folate were normal on 12/11/2019.  TSH was 4.542 (slightly elevated) with a normal free T4. Continue to monitor. 4.Neuropathy Patient has a stable grade I  neuropathy. Continue to monitor. 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 He takes Imodium daily. Daratumumab side effects: 16% diarrhea He notes decrease diarrhea after Decadron. 6.History of pulmonary embolism(2014) Continue Eliquis.  He denies any excess bruising or bleeding. 7.   Cycle #46 daratumumab today.   8.   RTC in 1 month for labs (CBC with diff, CMP, Mg, SPEP) and cycle #47 daratumumab. 9.   RTC in 2 months for MD assessment, labs (CBC with diff, CMP, Mg, SPEP, FLCA) and cycle #48 daratumumab.  I discussed the assessment and treatment plan with the patient.  The patient was provided an opportunity to ask questions and all were answered.  The patient agreed with the plan and demonstrated an understanding of the instructions.  The patient was advised to call back if the symptoms worsen or if the condition fails to improve as anticipated.   Lequita Asal, MD, PhD    07/06/2020, 9:33 AM  I, Mirian Mo Tufford, am acting as Education administrator for [employer]. Mike Gip, MD, PhD.  I, Remington Skalsky C. Mike Gip, MD, have reviewed the above documentation for accuracy and completeness, and I agree with the above.

## 2020-07-06 ENCOUNTER — Inpatient Hospital Stay: Payer: Medicare Other

## 2020-07-06 ENCOUNTER — Other Ambulatory Visit: Payer: Self-pay | Admitting: Hematology and Oncology

## 2020-07-06 ENCOUNTER — Inpatient Hospital Stay: Payer: Medicare Other | Attending: Hematology and Oncology | Admitting: Hematology and Oncology

## 2020-07-06 ENCOUNTER — Encounter: Payer: Self-pay | Admitting: Hematology and Oncology

## 2020-07-06 ENCOUNTER — Other Ambulatory Visit: Payer: Self-pay

## 2020-07-06 VITALS — BP 133/84 | HR 76 | Temp 96.0°F | Resp 18 | Wt 164.0 lb

## 2020-07-06 DIAGNOSIS — I2782 Chronic pulmonary embolism: Secondary | ICD-10-CM

## 2020-07-06 DIAGNOSIS — C9002 Multiple myeloma in relapse: Secondary | ICD-10-CM

## 2020-07-06 DIAGNOSIS — D539 Nutritional anemia, unspecified: Secondary | ICD-10-CM

## 2020-07-06 DIAGNOSIS — K529 Noninfective gastroenteritis and colitis, unspecified: Secondary | ICD-10-CM

## 2020-07-06 DIAGNOSIS — G629 Polyneuropathy, unspecified: Secondary | ICD-10-CM

## 2020-07-06 DIAGNOSIS — Z5112 Encounter for antineoplastic immunotherapy: Secondary | ICD-10-CM | POA: Insufficient documentation

## 2020-07-06 DIAGNOSIS — Z79899 Other long term (current) drug therapy: Secondary | ICD-10-CM | POA: Insufficient documentation

## 2020-07-06 DIAGNOSIS — C9000 Multiple myeloma not having achieved remission: Secondary | ICD-10-CM | POA: Diagnosis not present

## 2020-07-06 DIAGNOSIS — Z9484 Stem cells transplant status: Secondary | ICD-10-CM | POA: Diagnosis not present

## 2020-07-06 LAB — CBC WITH DIFFERENTIAL/PLATELET
Abs Immature Granulocytes: 0.02 10*3/uL (ref 0.00–0.07)
Basophils Absolute: 0 10*3/uL (ref 0.0–0.1)
Basophils Relative: 1 %
Eosinophils Absolute: 0.1 10*3/uL (ref 0.0–0.5)
Eosinophils Relative: 2 %
HCT: 35.4 % — ABNORMAL LOW (ref 39.0–52.0)
Hemoglobin: 12.5 g/dL — ABNORMAL LOW (ref 13.0–17.0)
Immature Granulocytes: 0 %
Lymphocytes Relative: 27 %
Lymphs Abs: 2 10*3/uL (ref 0.7–4.0)
MCH: 35.3 pg — ABNORMAL HIGH (ref 26.0–34.0)
MCHC: 35.3 g/dL (ref 30.0–36.0)
MCV: 100 fL (ref 80.0–100.0)
Monocytes Absolute: 0.7 10*3/uL (ref 0.1–1.0)
Monocytes Relative: 10 %
Neutro Abs: 4.7 10*3/uL (ref 1.7–7.7)
Neutrophils Relative %: 60 %
Platelets: 202 10*3/uL (ref 150–400)
RBC: 3.54 MIL/uL — ABNORMAL LOW (ref 4.22–5.81)
RDW: 13.2 % (ref 11.5–15.5)
WBC: 7.6 10*3/uL (ref 4.0–10.5)
nRBC: 0 % (ref 0.0–0.2)

## 2020-07-06 LAB — COMPREHENSIVE METABOLIC PANEL
ALT: 16 U/L (ref 0–44)
AST: 20 U/L (ref 15–41)
Albumin: 4 g/dL (ref 3.5–5.0)
Alkaline Phosphatase: 38 U/L (ref 38–126)
Anion gap: 8 (ref 5–15)
BUN: 17 mg/dL (ref 8–23)
CO2: 25 mmol/L (ref 22–32)
Calcium: 8.8 mg/dL — ABNORMAL LOW (ref 8.9–10.3)
Chloride: 104 mmol/L (ref 98–111)
Creatinine, Ser: 1.17 mg/dL (ref 0.61–1.24)
GFR calc Af Amer: >60 mL/min (ref >60)
GFR calc non Af Amer: >60 mL/min (ref >60)
Glucose, Bld: 103 mg/dL — ABNORMAL HIGH (ref 70–99)
Potassium: 3.8 mmol/L (ref 3.5–5.1)
Sodium: 137 mmol/L (ref 135–145)
Total Bilirubin: 0.7 mg/dL (ref 0.3–1.2)
Total Protein: 6.4 g/dL — ABNORMAL LOW (ref 6.5–8.1)

## 2020-07-06 LAB — MAGNESIUM: Magnesium: 1.8 mg/dL (ref 1.7–2.4)

## 2020-07-06 MED ORDER — SODIUM CHLORIDE 0.9 % IV SOLN
Freq: Once | INTRAVENOUS | Status: AC
  Filled 2020-07-06: qty 250

## 2020-07-06 MED ORDER — HEPARIN SOD (PORK) LOCK FLUSH 100 UNIT/ML IV SOLN
500.0000 [IU] | Freq: Once | INTRAVENOUS | Status: AC | PRN
  Administered 2020-07-06: 500 [IU]
  Filled 2020-07-06: qty 5

## 2020-07-06 MED ORDER — SODIUM CHLORIDE 0.9 % IV SOLN
16.0000 mg/kg | Freq: Once | INTRAVENOUS | Status: AC
  Administered 2020-07-06: 1200 mg via INTRAVENOUS
  Filled 2020-07-06: qty 60

## 2020-07-06 NOTE — Progress Notes (Signed)
Patient here for oncology follow-up appointment, chronic numbness. Reports one dizzy episode with low BP last week.

## 2020-07-07 LAB — PROTEIN ELECTROPHORESIS, SERUM
A/G Ratio: 1.3 (ref 0.7–1.7)
Albumin ELP: 3.5 g/dL (ref 2.9–4.4)
Alpha-1-Globulin: 0.1 g/dL (ref 0.0–0.4)
Alpha-2-Globulin: 0.8 g/dL (ref 0.4–1.0)
Beta Globulin: 0.9 g/dL (ref 0.7–1.3)
Gamma Globulin: 0.7 g/dL (ref 0.4–1.8)
Globulin, Total: 2.6 g/dL (ref 2.2–3.9)
M-Spike, %: 0.4 g/dL — ABNORMAL HIGH
Total Protein ELP: 6.1 g/dL (ref 6.0–8.5)

## 2020-07-07 LAB — KAPPA/LAMBDA LIGHT CHAINS
Kappa free light chain: 9.3 mg/L (ref 3.3–19.4)
Kappa, lambda light chain ratio: 2.16 — ABNORMAL HIGH (ref 0.26–1.65)
Lambda free light chains: 4.3 mg/L — ABNORMAL LOW (ref 5.7–26.3)

## 2020-07-12 ENCOUNTER — Other Ambulatory Visit: Payer: Self-pay

## 2020-07-12 MED ORDER — PAIN MANAGEMENT IT PUMP REFILL: 1.0000 | Freq: Once | INTRATHECAL | 0 refills | Status: AC

## 2020-08-03 ENCOUNTER — Other Ambulatory Visit: Payer: Self-pay

## 2020-08-03 ENCOUNTER — Inpatient Hospital Stay: Payer: Medicare Other | Attending: Hematology and Oncology

## 2020-08-03 ENCOUNTER — Encounter: Payer: Self-pay | Admitting: Pain Medicine

## 2020-08-03 ENCOUNTER — Inpatient Hospital Stay: Payer: Medicare Other

## 2020-08-03 ENCOUNTER — Ambulatory Visit: Payer: Medicare Other | Attending: Pain Medicine | Admitting: Pain Medicine

## 2020-08-03 ENCOUNTER — Other Ambulatory Visit: Payer: Self-pay | Admitting: Hematology and Oncology

## 2020-08-03 VITALS — BP 114/71 | HR 94 | Temp 97.0°F | Resp 16 | Ht 67.0 in | Wt 160.0 lb

## 2020-08-03 VITALS — BP 143/79 | HR 75 | Temp 96.8°F | Resp 18

## 2020-08-03 DIAGNOSIS — C9001 Multiple myeloma in remission: Secondary | ICD-10-CM | POA: Diagnosis present

## 2020-08-03 DIAGNOSIS — Z451 Encounter for adjustment and management of infusion pump: Secondary | ICD-10-CM | POA: Diagnosis present

## 2020-08-03 DIAGNOSIS — Z978 Presence of other specified devices: Secondary | ICD-10-CM | POA: Diagnosis present

## 2020-08-03 DIAGNOSIS — C9000 Multiple myeloma not having achieved remission: Secondary | ICD-10-CM | POA: Diagnosis not present

## 2020-08-03 DIAGNOSIS — G893 Neoplasm related pain (acute) (chronic): Secondary | ICD-10-CM | POA: Diagnosis present

## 2020-08-03 DIAGNOSIS — G894 Chronic pain syndrome: Secondary | ICD-10-CM

## 2020-08-03 DIAGNOSIS — M5416 Radiculopathy, lumbar region: Secondary | ICD-10-CM | POA: Diagnosis present

## 2020-08-03 DIAGNOSIS — Z95828 Presence of other vascular implants and grafts: Secondary | ICD-10-CM | POA: Diagnosis present

## 2020-08-03 DIAGNOSIS — Z5112 Encounter for antineoplastic immunotherapy: Secondary | ICD-10-CM | POA: Diagnosis present

## 2020-08-03 DIAGNOSIS — Z79899 Other long term (current) drug therapy: Secondary | ICD-10-CM | POA: Insufficient documentation

## 2020-08-03 DIAGNOSIS — C419 Malignant neoplasm of bone and articular cartilage, unspecified: Secondary | ICD-10-CM | POA: Diagnosis present

## 2020-08-03 DIAGNOSIS — M545 Low back pain, unspecified: Secondary | ICD-10-CM

## 2020-08-03 DIAGNOSIS — D539 Nutritional anemia, unspecified: Secondary | ICD-10-CM

## 2020-08-03 DIAGNOSIS — C9002 Multiple myeloma in relapse: Secondary | ICD-10-CM

## 2020-08-03 DIAGNOSIS — G8929 Other chronic pain: Secondary | ICD-10-CM | POA: Diagnosis present

## 2020-08-03 LAB — COMPREHENSIVE METABOLIC PANEL
ALT: 13 U/L (ref 0–44)
AST: 21 U/L (ref 15–41)
Albumin: 3.9 g/dL (ref 3.5–5.0)
Alkaline Phosphatase: 40 U/L (ref 38–126)
Anion gap: 10 (ref 5–15)
BUN: 18 mg/dL (ref 8–23)
CO2: 22 mmol/L (ref 22–32)
Calcium: 8.7 mg/dL — ABNORMAL LOW (ref 8.9–10.3)
Chloride: 102 mmol/L (ref 98–111)
Creatinine, Ser: 1.47 mg/dL — ABNORMAL HIGH (ref 0.61–1.24)
GFR, Estimated: 50 mL/min — ABNORMAL LOW (ref >60)
Glucose, Bld: 160 mg/dL — ABNORMAL HIGH (ref 70–99)
Potassium: 3.8 mmol/L (ref 3.5–5.1)
Sodium: 134 mmol/L — ABNORMAL LOW (ref 135–145)
Total Bilirubin: 0.5 mg/dL (ref 0.3–1.2)
Total Protein: 6.1 g/dL — ABNORMAL LOW (ref 6.5–8.1)

## 2020-08-03 LAB — MAGNESIUM: Magnesium: 1.8 mg/dL (ref 1.7–2.4)

## 2020-08-03 LAB — CBC WITH DIFFERENTIAL/PLATELET
Abs Immature Granulocytes: 0.03 10*3/uL (ref 0.00–0.07)
Basophils Absolute: 0 10*3/uL (ref 0.0–0.1)
Basophils Relative: 0 %
Eosinophils Absolute: 0.1 10*3/uL (ref 0.0–0.5)
Eosinophils Relative: 1 %
HCT: 35.3 % — ABNORMAL LOW (ref 39.0–52.0)
Hemoglobin: 12.2 g/dL — ABNORMAL LOW (ref 13.0–17.0)
Immature Granulocytes: 0 %
Lymphocytes Relative: 19 %
Lymphs Abs: 1.4 10*3/uL (ref 0.7–4.0)
MCH: 35 pg — ABNORMAL HIGH (ref 26.0–34.0)
MCHC: 34.6 g/dL (ref 30.0–36.0)
MCV: 101.1 fL — ABNORMAL HIGH (ref 80.0–100.0)
Monocytes Absolute: 0.3 10*3/uL (ref 0.1–1.0)
Monocytes Relative: 4 %
Neutro Abs: 5.4 10*3/uL (ref 1.7–7.7)
Neutrophils Relative %: 76 %
Platelets: 209 10*3/uL (ref 150–400)
RBC: 3.49 MIL/uL — ABNORMAL LOW (ref 4.22–5.81)
RDW: 13.4 % (ref 11.5–15.5)
WBC: 7.2 10*3/uL (ref 4.0–10.5)
nRBC: 0 % (ref 0.0–0.2)

## 2020-08-03 LAB — FERRITIN: Ferritin: 37 ng/mL (ref 24–336)

## 2020-08-03 LAB — IRON AND TIBC
Iron: 78 ug/dL (ref 45–182)
Saturation Ratios: 26 % (ref 17.9–39.5)
TIBC: 300 ug/dL (ref 250–450)
UIBC: 222 ug/dL

## 2020-08-03 MED ORDER — SODIUM CHLORIDE 0.9% FLUSH
10.0000 mL | INTRAVENOUS | Status: DC | PRN
  Administered 2020-08-03: 10 mL
  Filled 2020-08-03: qty 10

## 2020-08-03 MED ORDER — HEPARIN SOD (PORK) LOCK FLUSH 100 UNIT/ML IV SOLN
500.0000 [IU] | Freq: Once | INTRAVENOUS | Status: AC | PRN
  Administered 2020-08-03: 500 [IU]
  Filled 2020-08-03: qty 5

## 2020-08-03 MED ORDER — SODIUM CHLORIDE 0.9 % IV SOLN
Freq: Once | INTRAVENOUS | Status: AC
  Filled 2020-08-03: qty 250

## 2020-08-03 MED ORDER — SODIUM CHLORIDE 0.9 % IV SOLN
16.0000 mg/kg | Freq: Once | INTRAVENOUS | Status: AC
  Administered 2020-08-03: 1200 mg via INTRAVENOUS
  Filled 2020-08-03: qty 60

## 2020-08-03 NOTE — Progress Notes (Signed)
PROVIDER NOTE: Information contained herein reflects review and annotations entered in association with encounter. Interpretation of such information and data should be left to medically-trained personnel. Information provided to patient can be located elsewhere in the medical record under "Patient Instructions". Document created using STT-dictation technology, any transcriptional errors that may result from process are unintentional.    Patient: Logan Perez  Service Category: Procedure  Provider: Gaspar Cola, MD  DOB: 1945-11-28  DOS: 08/03/2020  Location: [facility]  MRN: [id number]  Setting: Ambulatory - outpatient  Referring Provider: Sofie Hartigan, MD  Type: Established Patient  Specialty: Interventional Pain Management  PCP: Sofie Hartigan, MD   Primary Reason for Visit: Interventional Pain Management Treatment. CC: Back Pain (lower)  Procedure:          Intrathecal Drug Delivery System (IDDS):  Type: Reservoir Refill [phone]) No rate change Region: Abdominal Laterality: Right  Type of Pump: Medtronic Synchromed II Delivery Route: Intrathecal Type of Pain Treated: Neuropathic/Nociceptive Primary Medication Class: Opioid/opiate  Medication, Concentration, Infusion Program, & Delivery Rate: Please see scanned programming printout.   Indications: 1. Chronic pain syndrome   2. Malignant neoplasm of bone, unspecified location ([place])   3. Cancer associated pain   4. Chronic low back pain   5. Chronic lumbar radicular pain   6. Multiple myeloma in remission ([place])   7. Presence of implanted infusion pump (Medtronic, programmable, intrathecal pump)   8. Presence of intrathecal pump   9. Encounter for adjustment or management of infusion pump    Pain Assessment: Self-Reported Pain Score: 3 /10             Reported level is compatible with observation.        Pharmacotherapy Assessment  Analgesic: Intrathecal PF-Fentanyl 502.2 mcg/day (20.9  mcg/hr) MME/day: 50.16 mg/day.   Monitoring: [place] PMP: PDMP reviewed during this encounter.       Pharmacotherapy: No side-effects or adverse reactions reported. Compliance: No problems identified. Effectiveness: Clinically acceptable. Plan: Refer to "POC".  UDS: No results found for: SUMMARY  Intrathecal Pump Therapy Assessment  Manufacturer: Medtronic Synchromed Type: Programmable Volume: 40 mL reservoir MRI compatibility: Yes   Drug content:  Primary Medication Class:Opioid Primary Medication:PF-Fentanyl(1500 mcg/mL) Secondary Medication:PF-Bupivacaine(30 mg/mL) Other Medication:PF-Clonidine(300 mcg/mL)   Programming:  Type: Simple continuous. See pump readout for details.   Changes:  Medication Change: None at this point Rate Change: No change in rate  Reported side-effects or adverse reactions: None reported  Effectiveness: Described as relatively effective, allowing for increase in activities of daily living (ADL) Clinically meaningful improvement in function (CMIF): Sustained CMIF goals met  Plan: Pump refill today  Pre-op Assessment:  Logan Perez is a 74 y.o. (year old), male patient, seen today for interventional treatment. He  has a past surgical history that includes Knee arthroscopy (Left, 1992); Pain pump implantation (2012); stem cell implant (2008); Cataract extraction w/PHACO (Right, 05/02/2016); Cataract extraction w/PHACO (Left, 05/16/2016); Limbal stem cell transplant; PORTA CATH INSERTION (N/A, 12/04/2016); Colonoscopy with propofol (N/A, 03/22/2017); Esophagogastroduodenoscopy (egd) with propofol (03/22/2017); Eye surgery; Back surgery (1994); and Pain pump implantation (N/A, 09/04/2017). Logan Perez has a current medication list which includes the following prescription(s): acetaminophen, bismuth subsalicylate, calcium-vitamin d, calcium citrate-vitamin d, cetirizine, vitamin d3, daratumumab, dexamethasone, diltiazem, diltiazem, diphenhydramine, eliquis,  fluticasone, carboxymethylcellulose sodium, loperamide, loratadine, lovastatin, montelukast, NON FORMULARY, omeprazole, ondansetron, tamsulosin, valacyclovir, vitamin b-12, baclofen, multiple vitamins-iron, potassium chloride sa, and [DISCONTINUED] PAIN MANAGEMENT IT PUMP REFILL. His primarily concern today is the Back  Pain (lower)  Initial Vital Signs:  Pulse/HCG Rate: 94  Temp: (!) 97 F (36.1 C) Resp: 16 BP: 114/71 SpO2: 99 %  BMI: Estimated body mass index is 25.06 kg/m as calculated from the following:   Height as of this encounter: 5' 7" (1.702 m).   Weight as of this encounter: 160 lb (72.6 kg).  Risk Assessment: Allergies: Reviewed. He is allergic to azithromycin, bee pollen, pollen extract, zoledronic acid, and rivaroxaban.  Allergy Precautions: None required Coagulopathies: Reviewed. None identified.  Blood-thinner therapy: None at this time Active Infection(s): Reviewed. None identified. Logan Perez is afebrile  Site Confirmation: Logan Perez was asked to confirm the procedure and laterality before marking the site Procedure checklist: Completed Consent: Before the procedure and under the influence of no sedative(s), amnesic(s), or anxiolytics, the patient was informed of the treatment options, risks and possible complications. To fulfill our ethical and legal obligations, as recommended by the American Medical Association's Code of Ethics, I have informed the patient of my clinical impression; the nature and purpose of the treatment or procedure; the risks, benefits, and possible complications of the intervention; the alternatives, including doing nothing; the risk(s) and benefit(s) of the alternative treatment(s) or procedure(s); and the risk(s) and benefit(s) of doing nothing.  Logan Perez was provided with information about the general risks and possible complications associated with most interventional procedures. These include, but are not limited to: failure to achieve  desired goals, infection, bleeding, organ or nerve damage, allergic reactions, paralysis, and/or death.  In addition, he was informed of those risks and possible complications associated to this particular procedure, which include, but are not limited to: damage to the implant; failure to decrease pain; local, systemic, or serious CNS infections, intraspinal abscess with possible cord compression and paralysis, or life-threatening such as meningitis; bleeding; organ damage; nerve injury or damage with subsequent sensory, motor, and/or autonomic system dysfunction, resulting in transient or permanent pain, numbness, and/or weakness of one or several areas of the body; allergic reactions, either minor or major life-threatening, such as anaphylactic or anaphylactoid reactions.  Furthermore, Mr. Collymore was informed of those risks and complications associated with the medications. These include, but are not limited to: allergic reactions (i.e.: anaphylactic or anaphylactoid reactions); endorphine suppression; bradycardia and/or hypotension; water retention and/or peripheral vascular relaxation leading to lower extremity edema and possible stasis ulcers; respiratory depression and/or shortness of breath; decreased metabolic rate leading to weight gain; swelling or edema; medication-induced neural toxicity; particulate matter embolism and blood vessel occlusion with resultant organ, and/or nervous system infarction; and/or intrathecal granuloma formation with possible spinal cord compression and permanent paralysis.  Before refilling the pump Mr. Deshler was informed that some of the medications used in the devise may not be FDA approved for such use and therefore it constitutes an off-label use of the medications.  Finally, he was informed that Medicine is not an exact science; therefore, there is also the possibility of unforeseen or unpredictable risks and/or possible complications that may result in a  catastrophic outcome. The patient indicated having understood very clearly. We have given the patient no guarantees and we have made no promises. Enough time was given to the patient to ask questions, all of which were answered to the patient's satisfaction. Mr. Gortney has indicated that he wanted to continue with the procedure. Attestation: I, the ordering provider, attest that I have discussed with the patient the benefits, risks, side-effects, alternatives, likelihood of achieving goals, and potential problems during recovery for the procedure  that I have provided informed consent. Date  Time: 08/03/2020  1:04 PM  Pre-Procedure Preparation:  Monitoring: As per clinic protocol. Respiration, ETCO2, SpO2, BP, heart rate and rhythm monitor placed and checked for adequate function Safety Precautions: Patient was assessed for positional comfort and pressure points before starting the procedure. Time-out: I initiated and conducted the "Time-out" before starting the procedure, as per protocol. The patient was asked to participate by confirming the accuracy of the "Time Out" information. Verification of the correct person, site, and procedure were performed and confirmed by me, the nursing staff, and the patient. "Time-out" conducted as per Joint Commission's Universal Protocol (UP.01.01.01). Time: 1306  Description of Procedure:          Position: Supine Target Area: Central-port of intrathecal pump. Approach: Anterior, 90 degree angle approach. Area Prepped: Entire Area around the pump implant. DuraPrep (Iodine Povacrylex [0.7% available iodine] and Isopropyl Alcohol, 74% w/w) Safety Precautions: Aspiration looking for blood return was conducted prior to all injections. At no point did we inject any substances, as a needle was being advanced. No attempts were made at seeking any paresthesias. Safe injection practices and needle disposal techniques used. Medications properly checked for expiration  dates. SDV (single dose vial) medications used. Description of the Procedure: Protocol guidelines were followed. Two nurses trained to do implant refills were present during the entire procedure. The refill medication was checked by both healthcare providers as well as the patient. The patient was included in the "Time-out" to verify the medication. The patient was placed in position. The pump was identified. The area was prepped in the usual manner. The sterile template was positioned over the pump, making sure the side-port location matched that of the pump. Both, the pump and the template were held for stability. The needle provided in the Medtronic Kit was then introduced thru the center of the template and into the central port. The pump content was aspirated and discarded volume documented. The new medication was slowly infused into the pump, thru the filter, making sure to avoid overpressure of the device. The needle was then removed and the area cleansed, making sure to leave some of the prepping solution back to take advantage of its long term bactericidal properties. The pump was interrogated and programmed to reflect the correct medication, volume, and dosage. The program was printed and taken to the physician for approval. Once checked and signed by the physician, a copy was provided to the patient and another scanned into the EMR. Vitals:   08/03/20 1303  BP: 114/71  Pulse: 94  Resp: 16  Temp: (!) 97 F (36.1 C)  SpO2: 99%  Weight: 160 lb (72.6 kg)  Height: 5' 7" (1.702 m)    Start Time: 1315 hrs. End Time: 1327 hrs. Materials & Medications: Medtronic Refill Kit Medication(s): Please see chart orders for details.  Imaging Guidance:          Type of Imaging Technique: None used Indication(s): N/A Exposure Time: No patient exposure Contrast: None used. Fluoroscopic Guidance: N/A Ultrasound Guidance: N/A Interpretation: N/A  Antibiotic Prophylaxis:   Anti-infectives (From  admission, onward)   None     Indication(s): None identified  Post-operative Assessment:  Post-procedure Vital Signs:  Pulse/HCG Rate: 94  Temp: (!) 97 F (36.1 C) Resp: 16 BP: 114/71 SpO2: 99 %  EBL: None  Complications: No immediate post-treatment complications observed by team, or reported by patient.  Note: The patient tolerated the entire procedure well. A repeat set of vitals  were taken after the procedure and the patient was kept under observation following institutional policy, for this type of procedure. Post-procedural neurological assessment was performed, showing return to baseline, prior to discharge. The patient was provided with post-procedure discharge instructions, including a section on how to identify potential problems. Should any problems arise concerning this procedure, the patient was given instructions to immediately contact us, at any time, without hesitation. In any case, we plan to contact the patient by telephone for a follow-up status report regarding this interventional procedure.  Comments:  No additional relevant information.  Plan of Care  Orders:  Orders Placed This Encounter  Procedures  . PUMP REFILL    Maintain Protocol by having two(2) healthcare providers during procedure and programming.    Scheduling Instructions:     Please refill intrathecal pump today.    Order Specific Question:   Where will this procedure be performed?    Answer:   [facility]  . PUMP REFILL    Whenever possible schedule on a procedure today.    Standing Status:   Future    Standing Expiration Date:   12/31/2020    Scheduling Instructions:     Please schedule intrathecal pump refill based on pump programming. Avoid schedule intervals of more than 120 days (4 months).    Order Specific Question:   Where will this procedure be performed?    Answer:   [facility]  . Informed Consent Details: Physician/Practitioner Attestation; Transcribe to consent  form and obtain patient signature    Transcribe to consent form and obtain patient signature.    Order Specific Question:   Physician/Practitioner attestation of informed consent for procedure/surgical case    Answer:   I, the physician/practitioner, attest that I have discussed with the patient the benefits, risks, side effects, alternatives, likelihood of achieving goals and potential problems during recovery for the procedure that I have provided informed consent.    Order Specific Question:   Procedure    Answer:   Intrathecal pump refill    Order Specific Question:   Physician/Practitioner performing the procedure    Answer:   Attending Physician: Kathlen Brunswick. Dossie Arbour, MD & designated trained staff    Order Specific Question:   Indication/Reason    Answer:   Chronic Pain Syndrome (G89.4), presence of an intrathecal pump (Z97.8)   Chronic Opioid Analgesic:  Intrathecal PF-Fentanyl 502.2 mcg/day (20.9 mcg/hr) MME/day: 50.16 mg/day.   Medications ordered for procedure: No orders of the defined types were placed in this encounter.  Medications administered: Lexine Baton "Rick" had no medications administered during this visit.  See the medical record for exact dosing, route, and time of administration.  Follow-up plan:   Return for Pump Refill (Max:42mo.       Interventional management options:  Considering:   Palliative intrathecal pump refills  No other procedures currently under consideration   Palliative PRN treatment(s):   Palliative management of intrathecal pump      Recent Visits Date Type Provider Dept  08/03/20 Procedure visit NMilinda Pointer MD [facility]  05/18/20 Procedure visit NMilinda Pointer MD [facility]  Showing recent visits within past 90 days and meeting all other requirements Future Appointments Date Type Provider Dept  11/02/20 Appointment NMilinda Pointer MD [facility]  Showing future appointments  within next 90 days and meeting all other requirements  Disposition: Discharge home  Discharge (Date  Time): 08/03/2020; 1332 hrs.   Primary Care Physician: FSofie Hartigan  MD Location: [facility] Note by: Gaspar Cola, MD Date: 08/03/2020; Time: 2:31 PM  Disclaimer:  Medicine is not an Chief Strategy Officer. The only guarantee in medicine is that nothing is guaranteed. It is important to note that the decision to proceed with this intervention was based on the information collected from the patient. The Data and conclusions were drawn from the patient's questionnaire, the interview, and the physical examination. Because the information was provided in large part by the patient, it cannot be guaranteed that it has not been purposely or unconsciously manipulated. Every effort has been made to obtain as much relevant data as possible for this evaluation. It is important to note that the conclusions that lead to this procedure are derived in large part from the available data. Always take into account that the treatment will also be dependent on availability of resources and existing treatment guidelines, considered by other Pain Management Practitioners as being common knowledge and practice, at the time of the intervention. For Medico-Legal purposes, it is also important to point out that variation in procedural techniques and pharmacological choices are the acceptable norm. The indications, contraindications, technique, and results of the above procedure should only be interpreted and judged by a Board-Certified Interventional Pain Specialist with extensive familiarity and expertise in the same exact procedure and technique.

## 2020-08-03 NOTE — Patient Instructions (Signed)
Opioid Overdose Opioids are drugs that are often used to treat pain. Opioids include illegal drugs, such as heroin, as well as prescription pain medicines, such as codeine, morphine, hydrocodone, oxycodone, and fentanyl. An opioid overdose happens when you take too much of an opioid. An overdose may be intentional or accidental and can happen with any type of opioid. The effects of an overdose can be mild, dangerous, or even deadly. Opioid overdose is a medical emergency. What are the causes? This condition may be caused by:  Taking too much of an opioid on purpose.  Taking too much of an opioid by accident.  Using two or more substances that contain opioids at the same time.  Taking an opioid with a substance that affects your heart, breathing, or blood pressure. These include alcohol, tranquilizers, sleeping pills, illegal drugs, and some over-the-counter medicines. This condition may also happen due to an error made by:  A health care provider who prescribes a medicine.  The pharmacist who fills the prescription order. What increases the risk? This condition is more likely in:  Children. They may be attracted to colorful pills. Because of a child's small size, even a small amount of a drug can be dangerous.  Older people. They may be taking many different drugs. Older people may have difficulty reading labels or remembering when they last took their medicine. They may also be more sensitive to the effects of opioids.  People with chronic medical conditions, especially heart, liver, kidney, or neurological diseases.  People who take an opioid for a long period of time.  People who use: ? Illegal drugs. IV heroin is especially dangerous. ? Other substances, including alcohol, while using an opioid.  People who have: ? A history of drug or alcohol abuse. ? Certain mental health conditions. ? A history of previous drug overdoses.  People who take opioids that are not prescribed  for them. What are the signs or symptoms? Symptoms of this condition depend on the type of opioid and the amount that was taken. Common symptoms include:  Sleepiness or difficulty waking from sleep.  Decrease in attention.  Confusion.  Slurred speech.  Slowed breathing and a slow pulse (bradycardia).  Nausea and vomiting.  Abnormally small pupils. Signs and symptoms that require emergency treatment include:  Cold, clammy, and pale skin.  Blue lips and fingernails.  Vomiting.  Gurgling sounds in the throat.  A pulse that is very slow or difficult to detect.  Breathing that is very irregular, slow, noisy, or difficult to detect.  Limp body.  Inability to respond to speech or be awakened from sleep (stupor).  Seizures. How is this diagnosed? This condition is diagnosed based on your symptoms and medical history. It is important to tell your health care provider:  About all of the opioids that you took.  When you took the opioids.  Whether you were drinking alcohol or using marijuana, cocaine, or other drugs. Your health care provider will do a physical exam. This exam may include:  Checking and monitoring your heart rate and rhythm, breathing rate, temperature, and blood pressure (vital signs).  Measuring oxygen levels in your blood.  Checking for abnormally small pupils. You may also have blood tests or urine tests. You may have X-rays if you are having severe breathing problems. How is this treated? This condition requires immediate medical treatment and hospitalization. Treatment is given in the hospital intensive care (ICU) setting. Supporting your vital signs and your breathing is the first step in  treating an opioid overdose. Treatment may also include:  Giving salts and minerals (electrolytes) along with fluids through an IV.  Inserting a breathing tube (endotracheal tube) in your airway to help you breathe if you cannot breathe on your own or you are in  danger of not being able to breathe on your own.  Giving oxygen through a small tube under your nose.  Passing a tube through your nose and into your stomach (nasogastric tube, or NG tube) to empty your stomach.  Giving medicines that: ? Increase your blood pressure. ? Relieve nausea and vomiting. ? Relieve abdominal pain and cramping. ? Reverse the effects of the opioid (naloxone).  Monitoring your heart and oxygen levels.  Ongoing counseling and mental health support if you intentionally overdosed or used an illegal drug. Follow these instructions at home:  Medicines  Take over-the-counter and prescription medicines only as told by your health care provider.  Always ask your health care provider about possible side effects and interactions of any new medicine that you start taking.  Keep a list of all the medicines that you take, including over-the-counter medicines. Bring this list with you to all your medical visits. General instructions  Drink enough fluid to keep your urine pale yellow.  Keep all follow-up visits as told by your health care provider. This is important. How is this prevented?  Read the drug inserts that come with your opioid pain medicines.  Take medicines only as told by your health care provider. Do not take more medicine than you are told. Do not take medicines more frequently than you are told.  Do not drink alcohol or take sedatives when taking opioids.  Do not use illegal or recreational drugs, including cocaine, ecstasy, and marijuana.  Do not take opioid medicines that are not prescribed for you.  Store all medicines in safety containers that are out of the reach of children.  Get help if you are struggling with: ? Alcohol or drug use. ? Depression or another mental health problem. ? Thoughts of hurting yourself or another person.  Keep the phone number of your local poison control center near your phone or in your mobile phone. In the  [place] the hotline of the [facility] is 330-807-0337.  If you were prescribed naloxone, make sure you understand how to take it. Contact a health care provider if you:  Need help understanding how to take your pain medicines.  Feel your medicines are too strong.  Are concerned that your pain medicines are not working well for your pain.  Develop new symptoms or side effects when you are taking medicines. Get help right away if:  You or someone else is having symptoms of an opioid overdose. Get help even if you are not sure.  You have serious thoughts about hurting yourself or others.  You have: ? Chest pain. ? Difficulty breathing. ? A loss of consciousness. These symptoms may represent a serious problem that is an emergency. Do not wait to see if the symptoms will go away. Get medical help right away. Call your local emergency services (911 in the [place] Do not drive yourself to the hospital. If you ever feel like you may hurt yourself or others, or have thoughts about taking your own life, get help right away. You can go to your nearest emergency department or call:  Your local emergency services (911 in the [place]  A suicide crisis helpline, such as the [place] at  (321) 574-5863. This is open 24 hours a day. Summary  Opioids are drugs that are often used to treat pain. Opioids include illegal drugs, such as heroin, as well as prescription pain medicines.  An opioid overdose happens when you take too much of an opioid.  Overdoses can be intentional or accidental.  Opioid overdose is very dangerous. It is a life-threatening emergency.  If you or someone you know is experiencing an opioid overdose, get help right away. This information is not intended to replace advice given to you by your health care provider. Make sure you discuss any questions you have with your health care provider. Document Revised: 09/12/2018 Document  Reviewed: 09/12/2018 [employer] Patient Education  [place].

## 2020-08-04 ENCOUNTER — Telehealth: Payer: Self-pay

## 2020-08-04 LAB — PROTEIN ELECTROPHORESIS, SERUM
A/G Ratio: 1.4 (ref 0.7–1.7)
Albumin ELP: 3.4 g/dL (ref 2.9–4.4)
Alpha-1-Globulin: 0.1 g/dL (ref 0.0–0.4)
Alpha-2-Globulin: 0.8 g/dL (ref 0.4–1.0)
Beta Globulin: 0.9 g/dL (ref 0.7–1.3)
Gamma Globulin: 0.6 g/dL (ref 0.4–1.8)
Globulin, Total: 2.4 g/dL (ref 2.2–3.9)
M-Spike, %: 0.3 g/dL — ABNORMAL HIGH
Total Protein ELP: 5.8 g/dL — ABNORMAL LOW (ref 6.0–8.5)

## 2020-08-04 MED FILL — Medication: INTRATHECAL | Qty: 1 | Status: AC

## 2020-08-04 NOTE — Telephone Encounter (Signed)
Post IT pump refill.  Patient states he is doing well 

## 2020-08-30 NOTE — Progress Notes (Signed)
[facility]  [address] [place], [place] [address] Phone: [phone]  Fax: [phone]   Clinic Day:  08/31/2020  Referring physician: Sofie Hartigan, MD  Chief Complaint: Logan Perez is a 74 y.o. 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   HPI: The patient was last seen in the medical oncology clinic on 07/06/2020. At that time, he was doing well.  He denied any interval infections.  Exam was stable. Hematocrit was 35.4, hemoglobin 12.5, MCV 100.0, platelets 202,000, WBC 7,600. Calcium was 8.8. Total protein was 6.4. Magnesium was 1.8. M spike was 0.4 g/dL. Kappa free light chain was 9.3, lambda free light chain 4.3, ratio 2.16. He received cycle #46 daratumumab.   Labs on 08/03/2020 revealed a hematocrit of 35.3, hemoglobin 12.2, MCV 101.1, platelets 209,000, WBC 7,200. Sodium was 134. Creatinine was 1.47 (CrCl 50 ml/min). Calcium was 8.7. Total protein was 6.1. Ferritin was 37 with an iron saturation of 26% and a TIBC of 300. Magnesium was 1.8. M spike was 0.3 g/dL. He received cycle #47 daratumumab.  During the interim, he has been "pretty good." He states that he does not remember getting blood drawn but that his memory is "pretty good." He has been nauseous everyday since his last treatment, which he described as a "hunger pain." He feels better when he eats or is walking around. He used to take an OTC motion sickness medication but it does not work as well anymore. He would like to try a prescription nausea medication.   The patient's diarrhea is stable. He noticed that his bowel movements occur in a cycle. He will have a day of 2-3 very loose stools, then a day of no bowel movements, then back to loose stools.  His restless legs are stable. He takes baclofen, which resolves his symptoms. The neuropathy in his feet is constant. He  sometimes has aching in the backs of his legs at night and leg weakness.  He stopped taking Klor-Con 2-3 months ago.   Past Medical History:  Diagnosis Date   Anxiety    Atrial fibrillation (HCC)    BPH (benign prostatic hyperplasia)    Complication of anesthesia    BAD HEADACHE NIGHT OF FIRST CATARACT   Compression fracture of lumbar vertebra (HCC)    Difficulty voiding    Dysrhythmia    A FIB   Elevated PSA    GERD (gastroesophageal reflux disease)    Hearing aid worn    bilateral   History of kidney stones    HLD (hyperlipidemia)    HOH (hard of hearing)    Hypertension    Multiple myeloma (HCC)    Neuropathy    feet. 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   Pain    BACK   Palpitations    Pneumonia    Pulmonary embolism (HCC)    Sepsis (HCC)    Stroke (HCC)    TIA, detected on CT scan. pt was unaware    Past Surgical History:  Procedure Laterality Date   BACK SURGERY  1994   CATARACT EXTRACTION W/PHACO Right 05/02/2016   Procedure: CATARACT EXTRACTION PHACO AND INTRAOCULAR LENS PLACEMENT (IOC);  Surgeon: Birder Robson, MD;  Location: [facility];  Service: Ophthalmology;  Laterality: Right;  [place] 1.06 AP% 20.6 CDE 13.70 FLUID PACK LOT # [id number]   CATARACT EXTRACTION W/PHACO Left 05/16/2016   Procedure: CATARACT EXTRACTION PHACO AND INTRAOCULAR  LENS PLACEMENT (IOC);  Surgeon: Birder Robson, MD;  Location: [facility];  Service: Ophthalmology;  Laterality: Left;  [place] 01:43 AP% 19.8 CDE 20.45 FLUID PACK LOT #[id number] H   COLONOSCOPY WITH PROPOFOL N/A 03/22/2017   Procedure: COLONOSCOPY WITH PROPOFOL;  Surgeon: Lucilla Lame, MD;  Location: [facility];  Service: Endoscopy;  Laterality: N/A;  has port   ESOPHAGOGASTRODUODENOSCOPY (EGD) WITH PROPOFOL  03/22/2017   Procedure: ESOPHAGOGASTRODUODENOSCOPY (EGD) WITH PROPOFOL;  Surgeon: Lucilla Lame, MD;  Location: [facility];  Service: Endoscopy;;   EYE SURGERY     KNEE ARTHROSCOPY Left 1992    LIMBAL STEM CELL TRANSPLANT     PAIN PUMP IMPLANTATION  2012   PAIN PUMP IMPLANTATION N/A 09/04/2017   Procedure: INTRATHECAL PUMP BATTERY CHANGE;  Surgeon: Milinda Pointer, MD;  Location: [facility];  Service: Neurosurgery;  Laterality: N/A;   PORTA CATH INSERTION N/A 12/04/2016   Procedure: Glori Luis Cath Insertion;  Surgeon: Algernon Huxley, MD;  Location: [facility];  Service: Cardiovascular;  Laterality: N/A;   stem cell implant  2008   [facility]    Family History  Problem Relation Age of Onset   Cancer Father        throat   Kidney disease Sister    Stroke Other    Stroke Mother    Bladder Cancer Neg Hx    Prostate cancer Neg Hx    Kidney cancer Neg Hx     Social History:  reports that he quit smoking about 44 years ago. His smoking use included cigarettes. He has a 30.00 pack-year smoking history. He has never used smokeless tobacco. He reports that he does not drink alcohol and does not use drugs. He has a wire haired dachshund. He lives in [place]. His wife's name is [name]. The patient is alone today.  Allergies:  Allergies  Allergen Reactions   Azithromycin Diarrhea and Other (See Comments)    Possible cause of C. Diff Possible cause of C. Diff Possible cause of C. Diff Possible cause of C. Diff   Bee Pollen Other (See Comments)    Sneezing, watery eyes, runny nose   Pollen Extract Other (See Comments)    Sneezing, watery eyes, runny nose   Zoledronic Acid Other (See Comments)    ONG- Osteonecrosis of the jaw  Osteonecrosis of the jaw   Rivaroxaban Rash    Current Medications: Current Outpatient Medications  Medication Sig Dispense Refill   acetaminophen (TYLENOL) 500 MG tablet Take 500 mg by mouth 2 (two) times a day.     baclofen (LIORESAL) 10 MG tablet Take 1 tablet (10 mg total) by mouth 2 (two) times daily. 180 tablet 3   bismuth subsalicylate (PEPTO BISMOL) 262 MG/15ML suspension Take 30 mLs by mouth every 6 (six) hours as needed for  diarrhea or loose stools.     calcium citrate-vitamin D (CITRACAL+D) 315-200 MG-UNIT tablet Take 1 tablet by mouth 2 (two) times daily.     cetirizine (ZYRTEC) 10 MG tablet Take 10 mg by mouth daily as needed for allergies.      Cholecalciferol (VITAMIN D3) 2000 units capsule Take 2,000 Units daily by mouth.      Daratumumab (DARZALEX IV) Inject 1,200 mg every 30 (thirty) days into the vein.      dexamethasone (DECADRON) 4 MG tablet TAKE 4 TABLETS BY MOUTH 1 HOUR PRIOR TO INFUSION, THEN 1 TABLET ON DAYS 2 AND 3 AS DIRECTED 30 tablet 0   diltiazem (CARDIZEM CD) 120 MG 24  hr capsule Take 120 mg daily by mouth.      diphenhydrAMINE (BENADRYL) 25 MG tablet Take 25 mg by mouth as directed. With chemo treatment     ELIQUIS 5 MG TABS tablet TAKE 1 TABLET(5 MG) BY MOUTH TWICE DAILY 60 tablet 0   Hypromellose (ARTIFICIAL TEARS OP) Place 1 drop as needed into both eyes (for dry eyes).     loperamide (IMODIUM) 2 MG capsule Take 4 mg as needed by mouth for diarrhea or loose stools.      loratadine (CLARITIN) 10 MG tablet Take 10 mg by mouth daily as needed.      lovastatin (MEVACOR) 20 MG tablet Take 20 mg by mouth every evening.      montelukast (SINGULAIR) 10 MG tablet TAKE 1 TABLET THE DAY OF AND 2 DAYS AFTER INFUSION 90 tablet 1   Multiple Vitamins-Iron (MULTIVITAMIN/IRON PO) Take 1 tablet by mouth daily.      NON FORMULARY IT pump  Fentanyl 1,500.0 mcg/ml Bupivicaine 30.0 mg/ml Clonidine 300.0 mcg/ml Rate 500.4 mg/day     omeprazole (PRILOSEC) 20 MG capsule Take 20 mg by mouth daily.      ondansetron (ZOFRAN) 8 MG tablet Take 1 tablet (8 mg total) by mouth as directed. Take with chemo and can take it as needed 20 tablet 1   tamsulosin (FLOMAX) 0.4 MG CAPS capsule TAKE 1 CAPSULE(0.4 MG) BY MOUTH DAILY 90 capsule 4   valACYclovir (VALTREX) 500 MG tablet TAKE 1 TABLET BY MOUTH EVERY DAY 30 tablet 3   vitamin B-12 (CYANOCOBALAMIN) 1000 MCG tablet Take 1,000 mcg daily by mouth.       Calcium Carb-Cholecalciferol (CALCIUM-VITAMIN D) 500-400 MG-UNIT TABS Take 1 tablet daily by mouth. (Patient not taking: Reported on 08/31/2020)     diltiazem (CARDIZEM) 60 MG tablet Take 60 mg daily as needed by mouth (for increased heart rate > 140).  (Patient not taking: Reported on 08/31/2020)     fluticasone (FLONASE) 50 MCG/ACT nasal spray Place 1 spray into the nose daily as needed for allergies.  (Patient not taking: Reported on 08/31/2020)     potassium chloride SA (KLOR-CON) 20 MEQ tablet TAKE 1 TABLET(20 MEQ) BY MOUTH DAILY (Patient not taking: Reported on 07/06/2020) 90 tablet 0   No current facility-administered medications for this visit.    Review of Systems  Constitutional: Negative for chills, diaphoresis, fever, malaise/fatigue and weight loss (up 1 lb).       Feels "pretty good."  HENT: Positive for hearing loss (hearing aid). Negative for congestion, ear discharge, ear pain, nosebleeds, sinus pain, sore throat and tinnitus.   Eyes: Negative.  Negative for blurred vision, double vision, photophobia and pain.  Respiratory: Negative.  Negative for cough, hemoptysis, sputum production, shortness of breath and wheezing.   Cardiovascular: Positive for palpitations (occasional). Negative for chest pain, orthopnea, leg swelling and PND.       H/o atrial fibrillation.  Gastrointestinal: Positive for diarrhea (on Imodium) and nausea (daily since last treatment). Negative for abdominal pain, blood in stool, constipation, heartburn, melena and vomiting.  Genitourinary: Negative.  Negative for dysuria, frequency, hematuria and urgency.  Musculoskeletal: Positive for back pain (chronic; Fentanyl/bupivacaine/clonidine pump; better with tylenol) and myalgias (pain in back of legs). Negative for falls, joint pain and neck pain.  Skin: Negative.  Negative for itching and rash.  Neurological: Positive for sensory change (restless legs; neuropathy in feet, on baclofen) and weakness (legs).  Negative for dizziness, tingling, tremors, speech change, focal weakness, seizures and headaches.  Endo/Heme/Allergies: Negative.  Negative for environmental allergies. Does not bruise/bleed easily.  Psychiatric/Behavioral: Negative.  Negative for depression and memory loss (memory is "pretty good."). The patient is not nervous/anxious and does not have insomnia.   All other systems reviewed and are negative.  Performance status (ECOG): 1  Vitals Blood pressure 133/83, pulse 78, temperature (!) 96.2 F (35.7 C), resp. rate 18, weight 165 lb 14.3 oz (75.3 kg).   Physical Exam Vitals and nursing note reviewed.  Constitutional:      General: He is not in acute distress.    Appearance: He is well-developed. He is not diaphoretic.  HENT:     Head: Normocephalic and atraumatic.     Comments: Short brown hair.    Mouth/Throat:     Mouth: Mucous membranes are moist.     Pharynx: Oropharynx is clear. No oropharyngeal exudate.  Eyes:     General: No scleral icterus.    Conjunctiva/sclera: Conjunctivae normal.     Pupils: Pupils are equal, round, and reactive to light.     Comments: Glasses. Blue eyes.  Neck:     Vascular: No JVD.  Cardiovascular:     Rate and Rhythm: Normal rate and regular rhythm.     Heart sounds: Normal heart sounds. No murmur heard.  No gallop.   Pulmonary:     Effort: Pulmonary effort is normal. No respiratory distress.     Breath sounds: Normal breath sounds. No wheezing or rales.  Abdominal:     General: Bowel sounds are normal. There is no distension.     Palpations: Abdomen is soft. There is no hepatomegaly, splenomegaly or mass.     Tenderness: There is no abdominal tenderness. There is no guarding or rebound.  Musculoskeletal:        General: No tenderness. Normal range of motion.     Cervical back: Normal range of motion and neck supple.  Lymphadenopathy:     Head:     Right side of head: No preauricular, posterior auricular or occipital adenopathy.      Left side of head: No preauricular, posterior auricular or occipital adenopathy.     Cervical: No cervical adenopathy.     Upper Body:     Right upper body: No supraclavicular or axillary adenopathy.     Left upper body: No supraclavicular or axillary adenopathy.     Lower Body: No right inguinal adenopathy. No left inguinal adenopathy.  Skin:    General: Skin is warm and dry.     Coloration: Skin is not pale.     Findings: No erythema or rash.  Neurological:     Mental Status: He is alert and oriented to person, place, and time.     Comments: Lower extremity strength is good. Sensation is intact. Trace patellar reflexes.  Psychiatric:        Behavior: Behavior normal.        Thought Content: Thought content normal.        Judgment: Judgment normal.    Appointment on 08/31/2020  Component Date Value Ref Range Status   WBC 08/31/2020 7.6  4.0 - 10.5 K/uL Final   RBC 08/31/2020 3.63* 4.22 - 5.81 MIL/uL Final   Hemoglobin 08/31/2020 12.6* 13.0 - 17.0 g/dL Final   HCT 08/31/2020 36.0* 39 - 52 % Final   MCV 08/31/2020 99.2  80.0 - 100.0 fL Final   MCH 08/31/2020 34.7* 26.0 - 34.0 pg Final   MCHC 08/31/2020 35.0  30.0 - 36.0 g/dL Final  RDW 08/31/2020 13.7  11.5 - 15.5 % Final   Platelets 08/31/2020 194  150 - 400 K/uL Final   nRBC 08/31/2020 0.0  0.0 - 0.2 % Final   Neutrophils Relative % 08/31/2020 74  % Final   Neutro Abs 08/31/2020 5.6  1.7 - 7.7 K/uL Final   Lymphocytes Relative 08/31/2020 17  % Final   Lymphs Abs 08/31/2020 1.3  0.7 - 4.0 K/uL Final   Monocytes Relative 08/31/2020 6  % Final   Monocytes Absolute 08/31/2020 0.5  0.1 - 1.0 K/uL Final   Eosinophils Relative 08/31/2020 2  % Final   Eosinophils Absolute 08/31/2020 0.2  0.0 - 0.5 K/uL Final   Basophils Relative 08/31/2020 0  % Final   Basophils Absolute 08/31/2020 0.0  0.0 - 0.1 K/uL Final   Immature Granulocytes 08/31/2020 1  % Final   Abs Immature Granulocytes 08/31/2020 0.04  0.00 - 0.07  K/uL Final   Performed at [facility], [address], [place], [place] [address]    Assessment:  Logan Perez is a 74 y.o. male with stage III mutiple myeloma. He initially presented with progressive back pain beginning in 12/2006. MRI revealed "spots and compression fractures". He began Velcade, thalidomide, and Decadron. In 08/2007, he underwent high dose chemotherapy and autologous stem cell transplant. 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  He recurred with a rising M-spike (2.7) with repeat M spike (1.7 gm/dl) in 03/2010. He was initially treated with Velcade (02/08/2010 - 05/10/2010). He then began Revlimid (15 mg 3 weeks on/1 week off) and Decadron (40 mg on day 1, 8, 15, 22). Because of significant side effect with Decadron his dose was decreased to 10 mg once a week in 07/2010.   He was on maintenance Revlimid. Revlimid was initially10 mg 3 weeks on/1 week off. This was changed to 10 mg 2 weeks on/2 weeks off secondary to right nipple tenderness. His dose was increased to 10 mg 3 weeks on/1 week off with Decadron 10 mg a week (on Sundays) and then Revlamid 15 mg 3 weeks on and 1 week off with Decadron on Sundays. He began Pomalyst4 mg 3 weeks on/1 week off with Decadron on 08/27/2015.  SPEPrevealed no monoclonal protein (04/21/2015) and 0.5 gm/dL on 09/22/2015 and 10/20/2015. M spikewas 0.1 on 02/02/2016, 03/01/2016, 03/29/2016, 04/26/2016, and 05/24/2016. M spikewas 0 on 10/11/2016, 11/28/2016, 02/05/2017, 03/21/2017, 08/02/2017, 10/04/2017, 12/27/2017, 01/24/2018, and 02/21/2018. M spikewas 0.1 on 11/01/2017, 0.1 on 11/29/2017, 0 on 12/27/2017, 0 on 01/24/2018, 0 on 02/21/2018, 0.1 on 03/21/2018, 0.3 on 04/18/2018, 0.1 on 05/16/2018, 0 on 06/13/2018, 0.2 on 08/08/2018, 0 on 09/09/2018, 0.1 on 11/04/2018, 0 on 12/02/2018, 0.1 on 12/30/2018, 0 on 01/27/2019, 0.1 on 02/24/2019, 0.1 on 03/24/2019, 0.1 on 04/23/2019, 0 on  05/19/2019, 0.1 on 06/17/2019, 0.2 on 07/15/2019, 0.2 on 08/18/2019, 0.2 on 09/15/2019, 0.1 on 10/13/2019, 0.1 on 11/13/2019, 0 on 12/11/2019, 0.3 on 01/08/2020, 0.2 on 02/05/2020, 0.2 on 03/04/2020, 0.2 on 04/01/2020, 0 on 05/05/2020, 0.4 on 06/07/2020, 0.4 on 07/06/2020, 0.3 on 08/03/2020 and 0.1 on 08/31/2020.  Free light chainshave been monitored. Kappa free light chainswere 18.54 on 11/21/2013, 18.37 on 02/20/2014, 18.93 on 05/22/2014, 32.58 (high; normal ratio 1.27) on 08/21/2014, 51.53 (high; elevated ratio of 2.12) on 11/13/2014, 28.08 (ratio 1.73) on 12/09/2014, 23.71 (ratio 2.17) on 01/18/2015, 92.93 (ratio 9.49) on 04/21/2015, 93.44 (ratio 10.28) on 05/26/2015, 255.45 (ratio of 24.05) on 07/14/2015, 373.89 (ratio 48.31) on 08/04/2015, 474.33 (ratio 70.58) on 08/25/2015, 450.76 (ratio  55.44) on 09/22/2015, 453.4 (ratio 59.89) on 10/20/2015, 58.26 (ratio of >40.74) on 02/02/2016, 62.67 (ratio of 37.98) on 03/01/2016, 75.7 (ratio >50.47) on 03/29/2016, 79.7 (ratio 53.13) on 04/26/2016, 108.6 (ratio >72.4) on 05/24/2016, 8.3 (1.46 ratio) on 10/11/2016, 6.7 (0.81 ratio) on 02/05/2017, 7.8 (1.01 ratio) on 03/21/2017,7.6 (ratio 0.63) on 06/01/2017, 8.1 (ratio 1.13) on 11/29/2017, 9.6 (ratio 1.55) on 06/13/2018, 6.6 (ratio 2.06) on 07/11/2018, 6.9 (ratio 0.82) on 08/08/2018, 5.8 (ratio 1.66) on 09/09/2018, 5.8 (ratio 1.05) on 11/04/2018, 5.6(ratio 2.55)on 12/02/2018, 5.5 (ratio 1.96) on 01/27/2019, 7.6 (ratio 2.38) on 06/17/2019, 6.9 (ratio 1.21) on 11/13/2019, 6.6 (ratio 2.0) on 12/11/2019, 7.9 (ratio 2.14) on 02/05/2020, 6.6 (ratio 2.28) on 04/01/2020, 7.0 (ratio 2.12) on 05/05/2020, 9.3 (ratio 2.16) on 07/06/2020, 7.1 (ratio 2.09) on 08/31/2020.  Bone surveyon 12/08/2014 was stable. Bone survey on 10/21/2015 revealed increase conspicuity of subcentimeter lytic lesions in the calvarium.  PET scan on 08/14/2018 revealed no evidence of active myeloma.  Bone survey on 11/04/2019 revealed stable  small lytic lesions throughout the skull.  There were no other lytic lesions.  Bone marrow aspirate and biopsyon 11/04/2015 revealed an atypical monoclonal plasma cells estimated at 30-40% of marrow cells. Marrow was variably cellular (approximately 45%) with background trilineage hematopoiesis. There was no significant increase in marrow reticulin fibers. Storage iron was present.   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 He develoed herpes zoster in 04/2008. He developed a pulmonary embolismin 05/2013. He was initially on Xarelto, but is now on Eliquis. He had an episode of pneumonia around this time requiring a brief admission. He developed severe lower leg cramps on 08/07/2014 secondary to hypokalemia. Duplex was negative.   He was treated forC difficile colitis(Flagyl completed 07/30/2015). He has a chronic indwelling pain pump.  He received 4 cycles of Pomalyst and Decadron(08/27/2015 - 11/19/2015). Restaging studies document progressive disease. Kappa free light chains are increasing. SPEP revealed 0.5 gm/dL monoclonal protein then 1.3 gm/dL. Bone survey reveals increase conspicuity of subcentimeter lytic lesions in the calvarium. Bone marrow reveals 30-40% plasma cells.   MUGAon 11/30/2015 revealed an ejection fraction of 46%. He is felt not to be a good candidate for Kyprolis. There were no focal wall motion abnormalities. He had a stress echo less than 1 year ago. He has a history of PVCs and atrial fibrillation. He takes Cardizem prn.  He received 17 weeks of daratumumab(Darzalex) (12/09/2015 - 05/25/2016). He tolerated treatment well without side effect.  Bone marrow on 02/09/2016 revealed no diagnostic morphologic evidence of plasma cell myeloma. Marrow was normocellular to hypocellular marrow for age (ranging from 10-40%) with maturing trilineage hematopoiesis and mild multilineage dyspoiesis.  There was patchy mild increase in reticulin. Storage iron was present. Flow cytometry revealed no definitive evidence of monoclonality. There was a non-specific atypical myeloid and monocytic findings with no increase in blasts. Cytogenetics were normal (46, XY).  He is currently60month s/p 2nd autologous stem cell transplanton 06/16/2016. 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 He also experienced atrial fibrillation with intermittent episodes of RVR requiring IV beta blockers. He is on prophylactic valacyclovir for 1 year post transplant.  He started vaccinations(DTaP-Pediatric triple vaccine, Hep B- Pediatrix triple vaccine, Haemophilus influenza B (Hib), inactivated polio virus (IPV), pneumococcal conjugate vaccine 13-valent (PCV 13)) on 04/17/2017. Recommendation was for Shingrix vaccine to be given in [place] second dose of Shingrix in 2 months. He had follow-up vaccinations on 08/28/2017. He had his second  shingles vaccine. He received 18 month vaccinations- DTaP-Pediatric triple vaccine, Hep B- Pediatrix triple vaccine, Haemophilus influenza B (Hib), inactivated polio virus (IPV), pneumococcal conjugate vaccine 13-valent (PCV 13) on 02/05/2018.  He is s/p47cycles ofdaratumumab(Darzalex) post transplant(12/05/2016 -08/03/2020).  PET scanon 08/14/2018 revealed no evidence of active myeloma on whole-body FDG PET scan. There was no evidence soft tissue plasmacytoma. There was no clear evidence of lytic lesions on the CT portion exam. Some lucencies in the pelvis were stable. There were chronic compression fractures in the lower thoracic spine.  Bone survey on 11/04/2019 revealed stable small lytic lesions noted throughout the skull.  There were no other lytic lesions. Exam was stable.   He has a history of hypomagnesemiathat required IV magnesium (2-4 gm) weekly. He has not required magnesium since 10/24/2016.  Patient has atrial fibrillationand is on Eliquis.  Symptomatically, he feels "pretty good."  He has had nausea.  Diarrhea is stable. The neuropathy in his feet is constant.  Exam is stable.  Plan: 1.   Labs today: CBC with diff, CMP, Mg, SPEP, FLCA 2.Multiple myeloma Clinically, he is doing fairly well M spike fluctuates between 0-0.4 gm/dL. M-spike was 0.3 last month. PET scan on 08/14/2018 revealed no evidence of active myeloma. Bone survey on 11/04/2019 revealed stable small lytic lesions in the skull.             No new lesions. Labs reviewed.  Cycle #48 daratumumab today.    Remains at home more confirmed: Tylenol, Benadryl, ondansetron, Singulair and Decadron.   Continue yearly bone survey. Discuss plan to continue visits every other month unless progressive trend in M spike. Continue prophylactic valacyclovir (until 3 months s/pdaratumumab). He is no longer followed in the transplant center. Periodic phone follow-up with [facility] Adriana Sim[facility] at [facility]. Discuss symptom management.  He has antiemetics at home to use on a prn bases.  Interventions are adequate.  Rx: ondansetron 8 mg p.o. 8 hours as needed nausea (dis #20 with 1 refill).           3. Macrocytic anemia Hematocrit36.0. Hemoglobin12.6. MCV99.2. B12 and folate were normal on 12/11/2019.  TSH was 4.542 (slightly elevated) with a normal free T4 on 12/11/2019. Recheck labs at next visit. 4.Neuropathy He notes a constant neuropathy in his feet. He is on baclofen. 5.Chronic diarrhea Symptomatically, he notes no increase or decrease in chronic diarrhea. He has had diarrhea since transplant. He takes Imodium daily. Daratumumab side effects: 16% diarrhea Continue to monitor 6.History of pulmonary embolism(2014) Continue Eliquis.  He denies any bruising or bleeding. 7.   Cycle #48 daratumumab today.   8.   Bone survey 11/04/2019. 9.   RTC in 1 month  for labs (CBC with diff, CMP, Mg, SPEP) and cycle #49  daratumumab. 10.   RTC in 2 months for MD assessment, labs (CBC with diff, CMP, Mg, SPEP, FLCA) and cycle #50 daratumumab.  I discussed the assessment and treatment plan with the patient.  The patient was provided an opportunity to ask questions and all were answered.  The patient agreed with the plan and demonstrated an understanding of the instructions.  The patient was advised to call back if the symptoms worsen or if the condition fails to improve as anticipated.  I provided 22 minutes of face-to-face time during this this encounter and > 50% was spent counseling as documented under my assessment and plan.  An additional 6 minutes were spent reviewing his chart (Epic and Care Everywhere) including notes, labs, and imaging  studies.    Lequita Asal, MD, PhD    08/31/2020, 9:23 AM  I, Mirian Mo Tufford, am acting as Education administrator for [employer]. Mike Gip, MD, PhD.  I, Melissa C. Mike Gip, MD, have reviewed the above documentation for accuracy and completeness, and I agree with the above.

## 2020-08-31 ENCOUNTER — Inpatient Hospital Stay: Payer: Medicare Other

## 2020-08-31 ENCOUNTER — Encounter: Payer: Self-pay | Admitting: Hematology and Oncology

## 2020-08-31 ENCOUNTER — Other Ambulatory Visit: Payer: Self-pay

## 2020-08-31 ENCOUNTER — Inpatient Hospital Stay: Payer: Medicare Other | Attending: Hematology and Oncology | Admitting: Hematology and Oncology

## 2020-08-31 VITALS — BP 133/83 | HR 78 | Temp 96.2°F | Resp 18 | Wt 165.9 lb

## 2020-08-31 DIAGNOSIS — Z5112 Encounter for antineoplastic immunotherapy: Secondary | ICD-10-CM | POA: Diagnosis present

## 2020-08-31 DIAGNOSIS — C9002 Multiple myeloma in relapse: Secondary | ICD-10-CM | POA: Diagnosis not present

## 2020-08-31 DIAGNOSIS — I2782 Chronic pulmonary embolism: Secondary | ICD-10-CM | POA: Diagnosis not present

## 2020-08-31 DIAGNOSIS — C9000 Multiple myeloma not having achieved remission: Secondary | ICD-10-CM

## 2020-08-31 DIAGNOSIS — D539 Nutritional anemia, unspecified: Secondary | ICD-10-CM

## 2020-08-31 DIAGNOSIS — C9001 Multiple myeloma in remission: Secondary | ICD-10-CM

## 2020-08-31 DIAGNOSIS — G629 Polyneuropathy, unspecified: Secondary | ICD-10-CM | POA: Diagnosis not present

## 2020-08-31 DIAGNOSIS — Z79899 Other long term (current) drug therapy: Secondary | ICD-10-CM | POA: Diagnosis not present

## 2020-08-31 DIAGNOSIS — R110 Nausea: Secondary | ICD-10-CM

## 2020-08-31 LAB — COMPREHENSIVE METABOLIC PANEL
ALT: 18 U/L (ref 0–44)
AST: 22 U/L (ref 15–41)
Albumin: 4 g/dL (ref 3.5–5.0)
Alkaline Phosphatase: 48 U/L (ref 38–126)
Anion gap: 10 (ref 5–15)
BUN: 14 mg/dL (ref 8–23)
CO2: 23 mmol/L (ref 22–32)
Calcium: 8.9 mg/dL (ref 8.9–10.3)
Chloride: 101 mmol/L (ref 98–111)
Creatinine, Ser: 1.25 mg/dL — ABNORMAL HIGH (ref 0.61–1.24)
GFR, Estimated: >60 mL/min (ref >60)
Glucose, Bld: 112 mg/dL — ABNORMAL HIGH (ref 70–99)
Potassium: 3.9 mmol/L (ref 3.5–5.1)
Sodium: 134 mmol/L — ABNORMAL LOW (ref 135–145)
Total Bilirubin: 0.6 mg/dL (ref 0.3–1.2)
Total Protein: 6.3 g/dL — ABNORMAL LOW (ref 6.5–8.1)

## 2020-08-31 LAB — MAGNESIUM: Magnesium: 1.7 mg/dL (ref 1.7–2.4)

## 2020-08-31 LAB — CBC WITH DIFFERENTIAL/PLATELET
Abs Immature Granulocytes: 0.04 10*3/uL (ref 0.00–0.07)
Basophils Absolute: 0 10*3/uL (ref 0.0–0.1)
Basophils Relative: 0 %
Eosinophils Absolute: 0.2 10*3/uL (ref 0.0–0.5)
Eosinophils Relative: 2 %
HCT: 36 % — ABNORMAL LOW (ref 39.0–52.0)
Hemoglobin: 12.6 g/dL — ABNORMAL LOW (ref 13.0–17.0)
Immature Granulocytes: 1 %
Lymphocytes Relative: 17 %
Lymphs Abs: 1.3 10*3/uL (ref 0.7–4.0)
MCH: 34.7 pg — ABNORMAL HIGH (ref 26.0–34.0)
MCHC: 35 g/dL (ref 30.0–36.0)
MCV: 99.2 fL (ref 80.0–100.0)
Monocytes Absolute: 0.5 10*3/uL (ref 0.1–1.0)
Monocytes Relative: 6 %
Neutro Abs: 5.6 10*3/uL (ref 1.7–7.7)
Neutrophils Relative %: 74 %
Platelets: 194 10*3/uL (ref 150–400)
RBC: 3.63 MIL/uL — ABNORMAL LOW (ref 4.22–5.81)
RDW: 13.7 % (ref 11.5–15.5)
WBC: 7.6 10*3/uL (ref 4.0–10.5)
nRBC: 0 % (ref 0.0–0.2)

## 2020-08-31 MED ORDER — SODIUM CHLORIDE 0.9 % IV SOLN
Freq: Once | INTRAVENOUS | Status: AC
  Filled 2020-08-31: qty 250

## 2020-08-31 MED ORDER — ONDANSETRON HCL 8 MG PO TABS: 8.0000 mg | ORAL_TABLET | ORAL | 1 refills | Status: AC

## 2020-08-31 MED ORDER — SODIUM CHLORIDE 0.9 % IV SOLN
16.0000 mg/kg | Freq: Once | INTRAVENOUS | Status: AC
  Administered 2020-08-31: 1200 mg via INTRAVENOUS
  Filled 2020-08-31: qty 60

## 2020-08-31 MED ORDER — HEPARIN SOD (PORK) LOCK FLUSH 100 UNIT/ML IV SOLN
500.0000 [IU] | Freq: Once | INTRAVENOUS | Status: AC | PRN
  Administered 2020-08-31: 500 [IU]
  Filled 2020-08-31: qty 5

## 2020-08-31 NOTE — Progress Notes (Signed)
Patient received prescribed treatment in clinic. Tolerated well. Patient stable at discharge. 

## 2020-08-31 NOTE — Progress Notes (Signed)
Pt states he took his premeds at home: 2 tylenol, singular, decadron, benadyrl and zofran.

## 2020-08-31 NOTE — Progress Notes (Signed)
Patient reports feeling nauseated for about the past month. Has diarrhea back and forth but is taking imodium and pepto bismol to control it.

## 2020-09-01 ENCOUNTER — Telehealth: Payer: Self-pay

## 2020-09-01 LAB — PROTEIN ELECTROPHORESIS, SERUM
A/G Ratio: 1.5 (ref 0.7–1.7)
Albumin ELP: 3.5 g/dL (ref 2.9–4.4)
Alpha-1-Globulin: 0.2 g/dL (ref 0.0–0.4)
Alpha-2-Globulin: 0.9 g/dL (ref 0.4–1.0)
Beta Globulin: 0.9 g/dL (ref 0.7–1.3)
Gamma Globulin: 0.3 g/dL — ABNORMAL LOW (ref 0.4–1.8)
Globulin, Total: 2.3 g/dL (ref 2.2–3.9)
M-Spike, %: 0.1 g/dL — ABNORMAL HIGH
Total Protein ELP: 5.8 g/dL — ABNORMAL LOW (ref 6.0–8.5)

## 2020-09-01 LAB — KAPPA/LAMBDA LIGHT CHAINS
Kappa free light chain: 7.1 mg/L (ref 3.3–19.4)
Kappa, lambda light chain ratio: 2.09 — ABNORMAL HIGH (ref 0.26–1.65)
Lambda free light chains: 3.4 mg/L — ABNORMAL LOW (ref 5.7–26.3)

## 2020-09-01 NOTE — Telephone Encounter (Signed)
Patient aware.

## 2020-09-01 NOTE — Telephone Encounter (Signed)
-----   Message from Lequita Asal, MD sent at 09/01/2020  4:40 PM EST ----- Regarding: Please call patient  M-spike is back down to 0.1.  ----- Message ----- From: Interface, [facility] [place] Sent: 08/31/2020   9:07 AM EST To: Lequita Asal, MD

## 2020-09-26 ENCOUNTER — Other Ambulatory Visit: Payer: Self-pay | Admitting: Urology

## 2020-09-28 ENCOUNTER — Inpatient Hospital Stay: Payer: Medicare Other | Attending: Hematology and Oncology

## 2020-09-28 ENCOUNTER — Inpatient Hospital Stay: Payer: Medicare Other

## 2020-09-28 ENCOUNTER — Other Ambulatory Visit: Payer: Self-pay

## 2020-09-28 ENCOUNTER — Other Ambulatory Visit: Payer: Self-pay | Admitting: Hematology and Oncology

## 2020-09-28 VITALS — BP 128/83 | HR 82 | Temp 96.0°F | Resp 18

## 2020-09-28 DIAGNOSIS — I2782 Chronic pulmonary embolism: Secondary | ICD-10-CM

## 2020-09-28 DIAGNOSIS — R110 Nausea: Secondary | ICD-10-CM

## 2020-09-28 DIAGNOSIS — D539 Nutritional anemia, unspecified: Secondary | ICD-10-CM

## 2020-09-28 DIAGNOSIS — C9000 Multiple myeloma not having achieved remission: Secondary | ICD-10-CM

## 2020-09-28 DIAGNOSIS — Z5112 Encounter for antineoplastic immunotherapy: Secondary | ICD-10-CM | POA: Insufficient documentation

## 2020-09-28 DIAGNOSIS — C9002 Multiple myeloma in relapse: Secondary | ICD-10-CM | POA: Insufficient documentation

## 2020-09-28 DIAGNOSIS — C9001 Multiple myeloma in remission: Secondary | ICD-10-CM

## 2020-09-28 DIAGNOSIS — G629 Polyneuropathy, unspecified: Secondary | ICD-10-CM

## 2020-09-28 DIAGNOSIS — Z79899 Other long term (current) drug therapy: Secondary | ICD-10-CM | POA: Diagnosis not present

## 2020-09-28 LAB — COMPREHENSIVE METABOLIC PANEL
ALT: 15 U/L (ref 0–44)
AST: 19 U/L (ref 15–41)
Albumin: 3.9 g/dL (ref 3.5–5.0)
Alkaline Phosphatase: 42 U/L (ref 38–126)
Anion gap: 10 (ref 5–15)
BUN: 19 mg/dL (ref 8–23)
CO2: 24 mmol/L (ref 22–32)
Calcium: 8.8 mg/dL — ABNORMAL LOW (ref 8.9–10.3)
Chloride: 101 mmol/L (ref 98–111)
Creatinine, Ser: 1.5 mg/dL — ABNORMAL HIGH (ref 0.61–1.24)
GFR, Estimated: 49 mL/min — ABNORMAL LOW (ref >60)
Glucose, Bld: 125 mg/dL — ABNORMAL HIGH (ref 70–99)
Potassium: 3.9 mmol/L (ref 3.5–5.1)
Sodium: 135 mmol/L (ref 135–145)
Total Bilirubin: 0.5 mg/dL (ref 0.3–1.2)
Total Protein: 6.3 g/dL — ABNORMAL LOW (ref 6.5–8.1)

## 2020-09-28 LAB — CBC WITH DIFFERENTIAL/PLATELET
Abs Immature Granulocytes: 0.01 10*3/uL (ref 0.00–0.07)
Basophils Absolute: 0.1 10*3/uL (ref 0.0–0.1)
Basophils Relative: 1 %
Eosinophils Absolute: 0.1 10*3/uL (ref 0.0–0.5)
Eosinophils Relative: 2 %
HCT: 36.4 % — ABNORMAL LOW (ref 39.0–52.0)
Hemoglobin: 12.5 g/dL — ABNORMAL LOW (ref 13.0–17.0)
Immature Granulocytes: 0 %
Lymphocytes Relative: 33 %
Lymphs Abs: 2.2 10*3/uL (ref 0.7–4.0)
MCH: 34.6 pg — ABNORMAL HIGH (ref 26.0–34.0)
MCHC: 34.3 g/dL (ref 30.0–36.0)
MCV: 100.8 fL — ABNORMAL HIGH (ref 80.0–100.0)
Monocytes Absolute: 0.8 10*3/uL (ref 0.1–1.0)
Monocytes Relative: 13 %
Neutro Abs: 3.3 10*3/uL (ref 1.7–7.7)
Neutrophils Relative %: 51 %
Platelets: 170 10*3/uL (ref 150–400)
RBC: 3.61 MIL/uL — ABNORMAL LOW (ref 4.22–5.81)
RDW: 13.5 % (ref 11.5–15.5)
WBC: 6.5 10*3/uL (ref 4.0–10.5)
nRBC: 0 % (ref 0.0–0.2)

## 2020-09-28 LAB — MAGNESIUM: Magnesium: 1.8 mg/dL (ref 1.7–2.4)

## 2020-09-28 MED ORDER — SODIUM CHLORIDE 0.9% FLUSH
3.0000 mL | INTRAVENOUS | Status: DC | PRN
  Filled 2020-09-28: qty 3

## 2020-09-28 MED ORDER — ALTEPLASE 2 MG IJ SOLR
2.0000 mg | Freq: Once | INTRAMUSCULAR | Status: DC | PRN
  Filled 2020-09-28: qty 2

## 2020-09-28 MED ORDER — SODIUM CHLORIDE 0.9 % IV SOLN
16.0000 mg/kg | Freq: Once | INTRAVENOUS | Status: AC
  Administered 2020-09-28: 09:00:00 1200 mg via INTRAVENOUS
  Filled 2020-09-28: qty 60

## 2020-09-28 MED ORDER — HEPARIN SOD (PORK) LOCK FLUSH 100 UNIT/ML IV SOLN
500.0000 [IU] | Freq: Once | INTRAVENOUS | Status: AC | PRN
  Administered 2020-09-28: 11:00:00 500 [IU]
  Filled 2020-09-28: qty 5

## 2020-09-28 MED ORDER — SODIUM CHLORIDE 0.9 % IV SOLN
Freq: Once | INTRAVENOUS | Status: AC
  Filled 2020-09-28: qty 250

## 2020-09-28 MED ORDER — HEPARIN SOD (PORK) LOCK FLUSH 100 UNIT/ML IV SOLN
250.0000 [IU] | Freq: Once | INTRAVENOUS | Status: DC | PRN
  Filled 2020-09-28: qty 5

## 2020-09-28 MED ORDER — SODIUM CHLORIDE 0.9% FLUSH
10.0000 mL | INTRAVENOUS | Status: DC | PRN
  Administered 2020-09-28: 08:00:00 10 mL
  Filled 2020-09-28: qty 10

## 2020-09-28 NOTE — Progress Notes (Signed)
Patient received prescribed treatment in clinic. Tolerated well. Patient stable at discharge. 

## 2020-09-29 LAB — PROTEIN ELECTROPHORESIS, SERUM
A/G Ratio: 1.4 (ref 0.7–1.7)
Albumin ELP: 3.5 g/dL (ref 2.9–4.4)
Alpha-1-Globulin: 0.1 g/dL (ref 0.0–0.4)
Alpha-2-Globulin: 0.8 g/dL (ref 0.4–1.0)
Beta Globulin: 0.9 g/dL (ref 0.7–1.3)
Gamma Globulin: 0.6 g/dL (ref 0.4–1.8)
Globulin, Total: 2.5 g/dL (ref 2.2–3.9)
M-Spike, %: 0.3 g/dL — ABNORMAL HIGH
Total Protein ELP: 6 g/dL (ref 6.0–8.5)

## 2020-10-04 ENCOUNTER — Other Ambulatory Visit: Payer: Self-pay

## 2020-10-04 MED ORDER — PAIN MANAGEMENT IT PUMP REFILL: 1.0000 | Freq: Once | INTRATHECAL | 0 refills | Status: AC

## 2020-10-13 ENCOUNTER — Other Ambulatory Visit: Payer: Self-pay | Admitting: Pain Medicine

## 2020-10-13 DIAGNOSIS — M62838 Other muscle spasm: Secondary | ICD-10-CM

## 2020-10-19 ENCOUNTER — Other Ambulatory Visit: Payer: Self-pay | Admitting: Pain Medicine

## 2020-10-19 ENCOUNTER — Telehealth: Payer: Self-pay | Admitting: Pain Medicine

## 2020-10-19 DIAGNOSIS — M62838 Other muscle spasm: Secondary | ICD-10-CM

## 2020-10-19 MED ORDER — BACLOFEN 10 MG PO TABS: 10.0000 mg | ORAL_TABLET | Freq: Two times a day (BID) | ORAL | 0 refills | Status: DC

## 2020-10-19 NOTE — Telephone Encounter (Signed)
Bubble message sent to Dr. Naveira. 

## 2020-10-19 NOTE — Telephone Encounter (Signed)
Script sent to pharmacy by Dr. Dossie Arbour. Patient notified.

## 2020-10-19 NOTE — Telephone Encounter (Signed)
Needs to get refill on baclofen sent in please. He has a pump refill on 11-02-20. Last time it was filled was for 3 month supply. Please call patient and let him know if this can be done. Thank you

## 2020-10-25 ENCOUNTER — Telehealth: Payer: Self-pay | Admitting: Hematology and Oncology

## 2020-10-25 NOTE — Progress Notes (Signed)
[facility]  [address], [address] [place], [place] [address] Phone: [phone]  Fax: [phone]   Clinic Day:  10/26/2020  Referring physician: Sofie Hartigan, MD  Chief Complaint: Logan Perez is a 75 y.o. 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   HPI: The patient was last seen in the medical oncology clinic on 08/31/2020. At that time, he felt "pretty good."  He had nausea.  Diarrhea was stable. The neuropathy in his feet was constant.  Exam was stable. Hematocrit was 36.0, hemoglobin 12.6, MCV 99.2, platelets 194,000, WBC 7,600. Sodium was 134. Creatinine was 1.25. Total protein was 6.3. Magnesium was 1.7. M spike was 0.1 gm/dL. Kappa free light chains were 7.1, lambda free light chains 3.4, ratio 2.09. He received cycle #48 daratumumab.   Labs on 09/28/2020 revealed a hematocrit of 36.4, hemoglobin 12.5, MCV 100.8, platelets 170,000, WBC 6,500 with an ANC of 3300. Creatinine was 1.50 (CrCl 49 ml/min). Calcium was 8.8 with an albumin of 3.9.  M spike was 0.3 gm/dL. He received cycle #49 daratumumab.  During the interim, he has been "pretty content." He did not feel like his normal self last month but is back to normal now. His pain is well controlled. His palpitations and leg aching have resolved. His diarrhea is stable; he describes his stool as "loose to runny." He takes imodium for this.  He needs a refill on his pre-meds. He took them today. He takes dexamethasone 24 mg per month around the time of his daratumumab.   Past Medical History:  Diagnosis Date  . Anxiety   . Atrial fibrillation ([place])   . BPH (benign prostatic hyperplasia)   . Complication of anesthesia    BAD HEADACHE NIGHT OF FIRST CATARACT  . Compression fracture of lumbar vertebra ([place])   . Difficulty voiding   . Dysrhythmia    A FIB  . Elevated PSA   . GERD  (gastroesophageal reflux disease)   . Hearing aid worn    bilateral  . History of kidney stones   . HLD (hyperlipidemia)   . HOH (hard of hearing)   . Hypertension   . Multiple myeloma ([place])   . Neuropathy    feet. 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  . Pain    BACK  . Palpitations   . Pneumonia   . Pulmonary embolism ([place])   . Sepsis ([place])   . Stroke [facility])    TIA, detected on CT scan. pt was unaware    Past Surgical History:  Procedure Laterality Date  . BACK SURGERY  1994  . CATARACT EXTRACTION W/PHACO Right 05/02/2016   Procedure: CATARACT EXTRACTION PHACO AND INTRAOCULAR LENS PLACEMENT (IOC);  Surgeon: Birder Robson, MD;  Location: [facility] ORS;  Service: Ophthalmology;  Laterality: Right;  [place] 1.06 AP% 20.6 CDE 13.70 FLUID PACK LOT # [id number]  . CATARACT EXTRACTION W/PHACO Left 05/16/2016   Procedure: CATARACT EXTRACTION PHACO AND INTRAOCULAR LENS PLACEMENT (IOC);  Surgeon: Birder Robson, MD;  Location: [facility] ORS;  Service: Ophthalmology;  Laterality: Left;  [place] 01:43 AP% 19.8 CDE 20.45 FLUID PACK LOT #[id number] H  . COLONOSCOPY WITH PROPOFOL N/A 03/22/2017   Procedure: COLONOSCOPY WITH PROPOFOL;  Surgeon: Lucilla Lame, MD;  Location: [place];  Service: Endoscopy;  Laterality: N/A;  has port  . ESOPHAGOGASTRODUODENOSCOPY (EGD) WITH PROPOFOL  03/22/2017   Procedure: ESOPHAGOGASTRODUODENOSCOPY (EGD) WITH PROPOFOL;  Surgeon: Lucilla Lame, MD;  Location: [place];  Service: Endoscopy;;  . EYE SURGERY    . KNEE ARTHROSCOPY Left 1992  . LIMBAL STEM CELL TRANSPLANT    . PAIN PUMP IMPLANTATION  2012  . PAIN PUMP IMPLANTATION N/A 09/04/2017   Procedure: INTRATHECAL PUMP BATTERY CHANGE;  Surgeon: Milinda Pointer, MD;  Location: [facility] OR[facility];  Service: Neurosurgery;  Laterality: N/A;  . PORTA CATH INSERTION N/A 12/04/2016   Procedure: Glori Luis Cath Insertion;  Surgeon: Algernon Huxley, MD;  Location: [place] [facility];  Service: Cardiovascular;  Laterality: N/A;  . stem cell implant   2008   [facility]    Family History  Problem Relation Age of Onset  . Cancer Father        throat  . Kidney disease Sister   . Stroke Other   . Stroke Mother   . Bladder Cancer Neg Hx   . Prostate cancer Neg Hx   . Kidney cancer Neg Hx     Social History:  reports that he quit smoking about 45 years ago. His smoking use included cigarettes. He has a 30.00 pack-year smoking history. He has never used smokeless tobacco. He reports that he does not drink alcohol and does not use drugs. He has a wire haired dachshund. He lives in [place]. His wife's name is [name]. The patient is alone today.  Allergies:  Allergies  Allergen Reactions  . Azithromycin Diarrhea and Other (See Comments)    Possible cause of C. Diff Possible cause of C. Diff Possible cause of C. Diff Possible cause of C. Diff  . Bee Pollen Other (See Comments)    Sneezing, watery eyes, runny nose  . Pollen Extract Other (See Comments)    Sneezing, watery eyes, runny nose  . 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 Rivaroxaban Rash    Current Medications: Current Outpatient Medications  Medication Sig Dispense Refill  . acetaminophen (TYLENOL) 500 MG tablet Take 500 mg by mouth 2 (two) times a day.    . baclofen (LIORESAL) 10 MG tablet Take 1 tablet (10 mg total) by mouth 2 (two) times daily. 180 tablet 0  . calcium citrate-vitamin D (CITRACAL+D) 315-200 MG-UNIT tablet Take 1 tablet by mouth 2 (two) times daily.    . cetirizine (ZYRTEC) 10 MG tablet Take 10 mg by mouth daily as needed for allergies.     . Cholecalciferol (VITAMIN D3) 2000 units capsule Take 2,000 Units daily by mouth.     . Daratumumab (DARZALEX IV) Inject 1,200 mg every 30 (thirty) days into the vein.     [name] dexamethasone (DECADRON) 4 MG tablet TAKE 4 TABLETS BY MOUTH 1 HOUR PRIOR TO INFUSION, THEN 1 TABLET ON DAYS 2 AND 3 AS DIRECTED 30 tablet 0  . diltiazem (CARDIZEM CD) 120 MG 24 hr capsule  Take 120 mg daily by mouth.     . diphenhydrAMINE (BENADRYL) 25 MG tablet Take 25 mg by mouth as directed. With chemo treatment    . ELIQUIS 5 MG TABS tablet TAKE 1 TABLET(5 MG) BY MOUTH TWICE DAILY 60 tablet 0  . Hypromellose (ARTIFICIAL TEARS OP) Place 1 drop as needed into both eyes (for dry eyes).    [name] loperamide (IMODIUM) 2 MG capsule Take 4 mg as needed by mouth for diarrhea or loose stools.     [name] loratadine (CLARITIN) 10 MG tablet Take 10 mg by mouth daily as needed.     [name]  lovastatin (MEVACOR) 20 MG tablet Take 20 mg by mouth every evening.     . montelukast (SINGULAIR) 10 MG tablet TAKE 1 TABLET THE DAY OF AND 2 DAYS AFTER INFUSION 90 tablet 1  . Multiple Vitamins-Iron (MULTIVITAMIN/IRON PO) Take 1 tablet by mouth daily.     . NON FORMULARY IT pump  Fentanyl 1,500.0 mcg/ml Bupivicaine 30.0 mg/ml Clonidine 300.0 mcg/ml Rate 500.4 mg/day    . omeprazole (PRILOSEC) 20 MG capsule Take 20 mg by mouth daily.     . ondansetron (ZOFRAN) 8 MG tablet Take 1 tablet (8 mg total) by mouth as directed. Take with chemo and can take it as needed 20 tablet 1  . tamsulosin (FLOMAX) 0.4 MG CAPS capsule TAKE 1 CAPSULE BY MOUTH DAILY 90 capsule 2  . valACYclovir (VALTREX) 500 MG tablet TAKE 1 TABLET BY MOUTH EVERY DAY 30 tablet 3  . vitamin B-12 (CYANOCOBALAMIN) 1000 MCG tablet Take 1,000 mcg daily by mouth.     . bismuth subsalicylate (PEPTO BISMOL) 262 MG/15ML suspension Take 30 mLs by mouth every 6 (six) hours as needed for diarrhea or loose stools. (Patient not taking: Reported on 10/26/2020)    . Calcium Carb-Cholecalciferol (CALCIUM-VITAMIN D) 500-400 MG-UNIT TABS Take 1 tablet daily by mouth. (Patient not taking: No sig reported)    . diltiazem (CARDIZEM) 60 MG tablet Take 60 mg daily as needed by mouth (for increased heart rate > 140).  (Patient not taking: No sig reported)    . fluticasone (FLONASE) 50 MCG/ACT nasal spray Place 1 spray into the nose daily as needed for allergies.  (Patient not  taking: No sig reported)    . potassium chloride SA (KLOR-CON) 20 MEQ tablet TAKE 1 TABLET(20 MEQ) BY MOUTH DAILY (Patient not taking: No sig reported) 90 tablet 0   No current facility-administered medications for this visit.    Review of Systems  Constitutional: Negative for chills, diaphoresis, fever, malaise/fatigue and weight loss (up 2 lbs).       Feels "pretty content."  HENT: Positive for hearing loss (hearing aid). Negative for congestion, ear discharge, ear pain, nosebleeds, sinus pain, sore throat and tinnitus.   Eyes: Negative.  Negative for blurred vision, double vision, photophobia and pain.  Respiratory: Negative.  Negative for cough, hemoptysis, sputum production, shortness of breath and wheezing.   Cardiovascular: Negative for chest pain, palpitations, orthopnea, leg swelling and PND.       H/o atrial fibrillation.  Gastrointestinal: Positive for diarrhea ("loose to runny" stools, on Imodium). Negative for abdominal pain, blood in stool, constipation, heartburn, melena, nausea and vomiting.  Genitourinary: Negative.  Negative for dysuria, frequency, hematuria and urgency.  Musculoskeletal: Positive for back pain (chronic; Fentanyl/bupivacaine/clonidine pump). Negative for falls, joint pain, myalgias and neck pain.  Skin: Negative.  Negative for itching and rash.  Neurological: Positive for sensory change (neuropathy in feet, on baclofen). Negative for dizziness, tingling, tremors, speech change, focal weakness, seizures, weakness and headaches.       Restless legs  Endo/Heme/Allergies: Negative.  Negative for environmental allergies. Does not bruise/bleed easily.  Psychiatric/Behavioral: Negative.  Negative for depression and memory loss. The patient is not nervous/anxious and does not have insomnia.   All other systems reviewed and are negative.  Performance status (ECOG): 1  Vitals Blood pressure 113/69, pulse 82, temperature (!) 96.5 F (35.8 C), temperature source  Tympanic, resp. rate 16, weight 167 lb 8.8 oz (76 kg), SpO2 100 %.   Physical Exam Vitals and nursing note reviewed.  Constitutional:  General: He is not in acute distress.    Appearance: He is well-developed. He is not diaphoretic.  HENT:     Head: Normocephalic and atraumatic.     Comments: Short brown hair.    Mouth/Throat:     Mouth: Mucous membranes are moist.     Pharynx: Oropharynx is clear. No oropharyngeal exudate.  Eyes:     General: No scleral icterus.    Extraocular Movements: Extraocular movements intact.     Conjunctiva/sclera: Conjunctivae normal.     Pupils: Pupils are equal, round, and reactive to light.     Comments: Glasses. Blue eyes.  Neck:     Vascular: No JVD.  Cardiovascular:     Rate and Rhythm: Normal rate and regular rhythm.     Heart sounds: Normal heart sounds. No murmur heard. No gallop.   Pulmonary:     Effort: Pulmonary effort is normal. No respiratory distress.     Breath sounds: Normal breath sounds. No wheezing or rales.  Chest:  Breasts:     Right: No axillary adenopathy or supraclavicular adenopathy.     Left: No axillary adenopathy or supraclavicular adenopathy.    Abdominal:     General: Bowel sounds are normal. There is no distension.     Palpations: Abdomen is soft. There is no hepatomegaly, splenomegaly or mass.     Tenderness: There is no abdominal tenderness. There is no guarding or rebound.  Musculoskeletal:        General: No tenderness. Normal range of motion.     Cervical back: Normal range of motion and neck supple.  Lymphadenopathy:     Head:     Right side of head: No preauricular, posterior auricular or occipital adenopathy.     Left side of head: No preauricular, posterior auricular or occipital adenopathy.     Cervical: No cervical adenopathy.     Upper Body:     Right upper body: No supraclavicular or axillary adenopathy.     Left upper body: No supraclavicular or axillary adenopathy.     Lower Body: No right  inguinal adenopathy. No left inguinal adenopathy.  Skin:    General: Skin is warm and dry.     Coloration: Skin is not pale.     Findings: No erythema or rash.  Neurological:     Mental Status: He is alert and oriented to person, place, and time.  Psychiatric:        Behavior: Behavior normal.        Thought Content: Thought content normal.        Judgment: Judgment normal.    Appointment on 10/26/2020  Component Date Value Ref Range Status  . Magnesium 10/26/2020 2.0  1.7 - 2.4 mg/dL Final   Performed at [facility], [address], [place], [place] [address]  . Sodium 10/26/2020 135  135 - 145 mmol/L Final  . Potassium 10/26/2020 4.3  3.5 - 5.1 mmol/L Final  . Chloride 10/26/2020 102  98 - 111 mmol/L Final  . CO2 10/26/2020 24  22 - 32 mmol/L Final  . Glucose, Bld 10/26/2020 143* 70 - 99 mg/dL Final   Glucose reference range applies only to samples taken after fasting for at least 8 hours.  . BUN 10/26/2020 24* 8 - 23 mg/dL Final  . Creatinine, Ser 10/26/2020 1.56* 0.61 - 1.24 mg/dL Final  . Calcium 10/26/2020 9.1  8.9 - 10.3 mg/dL Final  . Total Protein 10/26/2020 6.4* 6.5 - 8.1 g/dL Final  .  Albumin 10/26/2020 4.1  3.5 - 5.0 g/dL Final  . AST 10/26/2020 21  15 - 41 U/L Final  . ALT 10/26/2020 14  0 - 44 U/L Final  . Alkaline Phosphatase 10/26/2020 39  38 - 126 U/L Final  . Total Bilirubin 10/26/2020 0.6  0.3 - 1.2 mg/dL Final  . GFR, Estimated 10/26/2020 46* >60 mL/min Final   Comment: (NOTE) Calculated using the CKD-EPI Creatinine Equation (2021)   . Anion gap 10/26/2020 9  5 - 15 Final   Performed at [facility], [address] [place], [place] [address]  . WBC 10/26/2020 10.3  4.0 - 10.5 K/uL Final  . RBC 10/26/2020 3.68* 4.22 - 5.81 MIL/uL Final  . Hemoglobin 10/26/2020 12.9* 13.0 - 17.0 g/dL Final  . HCT 10/26/2020 37.1* 39.0 - 52.0 % Final  . MCV 10/26/2020 100.8* 80.0 - 100.0 fL Final  . MCH 10/26/2020 35.1* 26.0 - 34.0 pg Final  .  MCHC 10/26/2020 34.8  30.0 - 36.0 g/dL Final  . RDW 10/26/2020 13.5  11.5 - 15.5 % Final  . Platelets 10/26/2020 214  150 - 400 K/uL Final  . nRBC 10/26/2020 0.0  0.0 - 0.2 % Final  . Neutrophils Relative % 10/26/2020 90  % Final  . Neutro Abs 10/26/2020 9.1* 1.7 - 7.7 K/uL Final  . Lymphocytes Relative 10/26/2020 8  % Final  . Lymphs Abs 10/26/2020 0.8  0.7 - 4.0 K/uL Final  . Monocytes Relative 10/26/2020 2  % Final  . Monocytes Absolute 10/26/2020 0.3  0.1 - 1.0 K/uL Final  . Eosinophils Relative 10/26/2020 0  % Final  . Eosinophils Absolute 10/26/2020 0.0  0.0 - 0.5 K/uL Final  . Basophils Relative 10/26/2020 0  % Final  . Basophils Absolute 10/26/2020 0.0  0.0 - 0.1 K/uL Final  . Immature Granulocytes 10/26/2020 0  % Final  . Abs Immature Granulocytes 10/26/2020 0.04  0.00 - 0.07 K/uL Final   Performed at [facility], [address], [place], [place] [address]    Assessment:  Connery Shiffler is a 75 y.o. male with stage III mutiple myeloma. He initially presented with progressive back pain beginning in 12/2006. MRI revealed "spots and compression fractures". He began Velcade, thalidomide, and Decadron. In 08/2007, he underwent high dose chemotherapy and autologous stem cell transplant. 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  He recurred with a rising M-spike (2.7) with repeat M spike (1.7 gm/dl) in 03/2010. He was initially treated with Velcade (02/08/2010 - 05/10/2010). He then began Revlimid (15 mg 3 weeks on/1 week off) and Decadron (40 mg on day 1, 8, 15, 22). Because of significant side effect with Decadron his dose was decreased to 10 mg once a week in 07/2010.   He was on maintenance Revlimid. Revlimid was initially10 mg 3 weeks on/1 week off. This was changed to 10 mg 2 weeks on/2 weeks off secondary to right nipple tenderness. His dose was increased to 10 mg 3 weeks on/1 week off with Decadron 10 mg a week (on Sundays)  and then Revlamid 15 mg 3 weeks on and 1 week off with Decadron on Sundays. He began Pomalyst4 mg 3 weeks on/1 week off with Decadron on 08/27/2015.  SPEPrevealed no monoclonal protein (04/21/2015) and 0.5 gm/dL on 09/22/2015 and 10/20/2015. M spikewas 0.1 on 02/02/2016, 03/01/2016, 03/29/2016, 04/26/2016, and 05/24/2016. M spikewas 0 on 10/11/2016, 11/28/2016, 02/05/2017, 03/21/2017, 08/02/2017, 10/04/2017, 12/27/2017, 01/24/2018, and 02/21/2018. M spikewas 0.1 on 11/01/2017,  0.1 on 11/29/2017, 0 on 12/27/2017, 0 on 01/24/2018, 0 on 02/21/2018, 0.1 on 03/21/2018, 0.3 on 04/18/2018, 0.1 on 05/16/2018, 0 on 06/13/2018, 0.2 on 08/08/2018, 0 on 09/09/2018, 0.1 on 11/04/2018, 0 on 12/02/2018, 0.1 on 12/30/2018, 0 on 01/27/2019, 0.1 on 02/24/2019, 0.1 on 03/24/2019, 0.1 on 04/23/2019, 0 on 05/19/2019, 0.1 on 06/17/2019, 0.2 on 07/15/2019, 0.2 on 08/18/2019, 0.2 on 09/15/2019, 0.1 on 10/13/2019, 0.1 on 11/13/2019, 0 on 12/11/2019, 0.3 on 01/08/2020, 0.2 on 02/05/2020, 0.2 on 03/04/2020, 0.2 on 04/01/2020, 0 on 05/05/2020, 0.4 on 06/07/2020, 0.4 on 07/06/2020, 0.3 on 08/03/2020, 0.1 on 08/31/2020, 0.3 on 09/28/2020, and 0.2 on 10/26/2020.  Free light chainshave been monitored. Kappa free light chainswere 18.54 on 11/21/2013, 18.37 on 02/20/2014, 18.93 on 05/22/2014, 32.58 (high; normal ratio 1.27) on 08/21/2014, 51.53 (high; elevated ratio of 2.12) on 11/13/2014, 28.08 (ratio 1.73) on 12/09/2014, 23.71 (ratio 2.17) on 01/18/2015, 92.93 (ratio 9.49) on 04/21/2015, 93.44 (ratio 10.28) on 05/26/2015, 255.45 (ratio of 24.05) on 07/14/2015, 373.89 (ratio 48.31) on 08/04/2015, 474.33 (ratio 70.58) on 08/25/2015, 450.76 (ratio 55.44) on 09/22/2015, 453.4 (ratio 59.89) on 10/20/2015, 58.26 (ratio of >40.74) on 02/02/2016, 62.67 (ratio of 37.98) on 03/01/2016, 75.7 (ratio >50.47) on 03/29/2016, 79.7 (ratio 53.13) on 04/26/2016, 108.6 (ratio >72.4) on 05/24/2016, 8.3 (1.46 ratio) on 10/11/2016, 6.7 (0.81 ratio)  on 02/05/2017, 7.8 (1.01 ratio) on 03/21/2017,7.6 (ratio 0.63) on 06/01/2017, 8.1 (ratio 1.13) on 11/29/2017, 9.6 (ratio 1.55) on 06/13/2018, 6.6 (ratio 2.06) on 07/11/2018, 6.9 (ratio 0.82) on 08/08/2018, 5.8 (ratio 1.66) on 09/09/2018, 5.8 (ratio 1.05) on 11/04/2018, 5.6(ratio 2.55)on 12/02/2018, 5.5 (ratio 1.96) on 01/27/2019, 7.6 (ratio 2.38) on 06/17/2019, 6.9 (ratio 1.21) on 11/13/2019, 6.6 (ratio 2.0) on 12/11/2019, 7.9 (ratio 2.14) on 02/05/2020, 6.6 (ratio 2.28) on 04/01/2020, 7.0 (ratio 2.12) on 05/05/2020, 9.3 (ratio 2.16) on 07/06/2020,  7.1 (ratio 2.09) on 08/31/2020, and 6.9 (ratio 2.09) on 10/26/2020.  Bone surveyon 12/08/2014 was stable. Bone survey on 10/21/2015 revealed increase conspicuity of subcentimeter lytic lesions in the calvarium.  PET scan on 08/14/2018 revealed no evidence of active myeloma.  Bone survey on 11/04/2019 revealed stable small lytic lesions throughout the skull.  There were no other lytic lesions.  Bone marrow aspirate and biopsyon 11/04/2015 revealed an atypical monoclonal plasma cells estimated at 30-40% of marrow cells. Marrow was variably cellular (approximately 45%) with background trilineage hematopoiesis. There was no significant increase in marrow reticulin fibers. Storage iron was present.   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 He develoed herpes zoster in 04/2008. He developed a pulmonary embolismin 05/2013. He was initially on Xarelto, but is now on Eliquis. He had an episode of pneumonia around this time requiring a brief admission. He developed severe lower leg cramps on 08/07/2014 secondary to hypokalemia. Duplex was negative.   He was treated forC difficile colitis(Flagyl completed 07/30/2015). He has a chronic indwelling pain pump.  He received 4 cycles of Pomalyst and Decadron(08/27/2015 - 11/19/2015). Restaging studies document progressive disease. Kappa free  light chains are increasing. SPEP revealed 0.5 gm/dL monoclonal protein then 1.3 gm/dL. Bone survey reveals increase conspicuity of subcentimeter lytic lesions in the calvarium. Bone marrow reveals 30-40% plasma cells.   MUGAon 11/30/2015 revealed an ejection fraction of 46%. He is felt not to be a good candidate for Kyprolis. There were no focal wall motion abnormalities. He had a stress echo less than 1 year ago. He has a history of PVCs and atrial fibrillation. He takes Cardizem prn.  He received  17 weeks of daratumumab(Darzalex) (12/09/2015 - 05/25/2016). He tolerated treatment well without side effect.  Bone marrow on 02/09/2016 revealed no diagnostic morphologic evidence of plasma cell myeloma. Marrow was normocellular to hypocellular marrow for age (ranging from 10-40%) with maturing trilineage hematopoiesis and mild multilineage dyspoiesis. There was patchy mild increase in reticulin. Storage iron was present. Flow cytometry revealed no definitive evidence of monoclonality. There was a non-specific atypical myeloid and monocytic findings with no increase in blasts. Cytogenetics were normal (46, XY).  He is currently48month s/p 2nd autologous stem cell transplanton 06/16/2016. 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 He also experienced atrial fibrillation with intermittent episodes of RVR requiring IV beta blockers. He is on prophylactic valacyclovir for 1 year post transplant.  He started vaccinations(DTaP-Pediatric triple vaccine, Hep B- Pediatrix triple vaccine, Haemophilus influenza B (Hib), inactivated polio virus (IPV), pneumococcal conjugate vaccine 13-valent (PCV 13)) on 04/17/2017. Recommendation was for [place]rix vaccine [place]rix in 2 months. He had follow-up vaccinations on 08/28/2017. He had his second shingles vaccine. He received 18 month vaccinations-  DTaP-Pediatric triple vaccine, Hep B- Pediatrix triple vaccine, Haemophilus influenza B (Hib), inactivated polio virus (IPV), pneumococcal conjugate vaccine 13-valent (PCV 13) on 02/05/2018.  He is s/p49cycles ofdaratumumab(Darzalex) post transplant(12/05/2016 -09/28/2020).  PET scanon 08/14/2018 revealed no evidence of active myeloma on whole-body FDG PET scan. There was no evidence soft tissue plasmacytoma. There was no clear evidence of lytic lesions on the CT portion exam. Some lucencies in the pelvis were stable. There were chronic compression fractures in the lower thoracic spine.  Bone survey on 11/04/2019 revealed stable small lytic lesions noted throughout the skull.  There were no other lytic lesions. Exam was stable.   He has a history of hypomagnesemiathat required IV magnesium (2-4 gm) weekly. He has not required magnesium since 10/24/2016. Patient has atrial fibrillationand is on Eliquis.  Symptomatically, he feels "alright".  He denies any bone pain or interval infections.  He has chronic well managed diarrhea.  Plan: 1.   Labs today: CBC with diff, CMP, Mg, ferritin, iron studies, SPEP, FLCA, G6PD assay. 2.Multiple myeloma Clinically, he is doing fairly well M spike fluctuates between 0.1 - 0.4 gm/dL in the past year without trend. M-spike is 0.2 gm/dL today. PET scan on 08/14/2018 revealed no evidence of active myeloma. Bone survey on 11/04/2019 revealed stable small lytic lesions in the skull.             No new lesions. Discuss plan for follow-up bone survey this month. Labs reviewed.  Cycle #50 daratumumab today.    He continues premedications taken at home: Tylenol, Benadryl, ondansetron, Singulair and Decadron.    Takes Decadron 24 mg a month (discuss potential for decreasing dose).  Consider switch to daratumumab SQ. Continue visits every other month unless aggressive trend in M spike. Continue prophylactic valacyclovir (until 3 months  s/pdaratumumab). He is no longer followed in the transplant center. [facility]iscuss symptom management.  He has antiemetics at home to use on a prn bases.  Interventions are adequate.  Rx: Singulair and Decadron.           3. Macrocytic anemia Hematocrit37.1. Hemoglobin12.9. MCV100.8. B12 and folate were normal on 12/11/2019.  TSH was 4.542 (slightly elevated) with a normal free T4 on 12/11/2019. Continue to check B12 and folate annually. 4.Neuropathy Neuropathy is stable. He remains on baclofen. 5.Chronic  diarrhea Symptomatically, stools remain loose/runny. He has had diarrhea since transplant. He takes Imodium 2 times a day at most. Daratumumab side effects: 16% diarrhea Continue to monitor. 6.History of pulmonary embolism(2014) Continue Eliquis.  Denies any bruising or bleeding. 7.   Bone survey 11/01/2020. 8.   Cycle #50 daratumumab today. 9.   RTC in 1 month for labs (CBC with diff, CMP, Mg, SPEP) and cycle #51 daratumumab. 10.   RTC in 2 months for MD assess, labs (CBC with diff, CMP, B12, folate, TSH, SPEP, FLCA) and CYCLE #52 daratumumab.  I discussed the assessment and treatment plan with the patient.  The patient was provided an opportunity to ask questions and all were answered.  The patient agreed with the plan and demonstrated an understanding of the instructions.  The patient was advised to call back if the symptoms worsen or if the condition fails to improve as anticipated.  I provided 27 minutes of face-to-face time during this this encounter and > 50% was spent counseling as documented under my assessment and plan.  An additional 5 minutes were spent reviewing his chart (Epic and Care Everywhere) including notes, labs, and imaging studies.    Lequita Asal, MD, PhD    10/26/2020, 11:52 AM  I, Mirian Mo Tufford, am acting as Education administrator for [employer].  Mike Gip, MD, PhD.  I, Cash Duce C. Mike Gip, MD, have reviewed the above documentation for accuracy and completeness, and I agree with the above.

## 2020-10-25 NOTE — Telephone Encounter (Signed)
10/25/2020 Pt informed that clinic will be operating on 2 hr delay and he is to arrive @ 10:15. Pt confirmed this change SRW

## 2020-10-26 ENCOUNTER — Inpatient Hospital Stay: Payer: Medicare Other | Attending: Hematology and Oncology | Admitting: Hematology and Oncology

## 2020-10-26 ENCOUNTER — Inpatient Hospital Stay: Payer: Medicare Other

## 2020-10-26 ENCOUNTER — Other Ambulatory Visit: Payer: Self-pay | Admitting: Hematology and Oncology

## 2020-10-26 ENCOUNTER — Encounter: Payer: Self-pay | Admitting: Hematology and Oncology

## 2020-10-26 ENCOUNTER — Other Ambulatory Visit: Payer: Self-pay

## 2020-10-26 VITALS — BP 113/69 | HR 82 | Temp 96.5°F | Resp 16 | Wt 167.5 lb

## 2020-10-26 VITALS — BP 117/70 | HR 80

## 2020-10-26 DIAGNOSIS — G629 Polyneuropathy, unspecified: Secondary | ICD-10-CM

## 2020-10-26 DIAGNOSIS — R110 Nausea: Secondary | ICD-10-CM

## 2020-10-26 DIAGNOSIS — Z5112 Encounter for antineoplastic immunotherapy: Secondary | ICD-10-CM

## 2020-10-26 DIAGNOSIS — D539 Nutritional anemia, unspecified: Secondary | ICD-10-CM

## 2020-10-26 DIAGNOSIS — D649 Anemia, unspecified: Secondary | ICD-10-CM

## 2020-10-26 DIAGNOSIS — C9001 Multiple myeloma in remission: Secondary | ICD-10-CM

## 2020-10-26 DIAGNOSIS — C9002 Multiple myeloma in relapse: Secondary | ICD-10-CM

## 2020-10-26 DIAGNOSIS — I2782 Chronic pulmonary embolism: Secondary | ICD-10-CM

## 2020-10-26 DIAGNOSIS — C9000 Multiple myeloma not having achieved remission: Secondary | ICD-10-CM

## 2020-10-26 DIAGNOSIS — D509 Iron deficiency anemia, unspecified: Secondary | ICD-10-CM | POA: Diagnosis not present

## 2020-10-26 DIAGNOSIS — Z79899 Other long term (current) drug therapy: Secondary | ICD-10-CM | POA: Insufficient documentation

## 2020-10-26 DIAGNOSIS — Z7901 Long term (current) use of anticoagulants: Secondary | ICD-10-CM

## 2020-10-26 DIAGNOSIS — K529 Noninfective gastroenteritis and colitis, unspecified: Secondary | ICD-10-CM

## 2020-10-26 LAB — CBC WITH DIFFERENTIAL/PLATELET
Abs Immature Granulocytes: 0.04 10*3/uL (ref 0.00–0.07)
Basophils Absolute: 0 10*3/uL (ref 0.0–0.1)
Basophils Relative: 0 %
Eosinophils Absolute: 0 10*3/uL (ref 0.0–0.5)
Eosinophils Relative: 0 %
HCT: 37.1 % — ABNORMAL LOW (ref 39.0–52.0)
Hemoglobin: 12.9 g/dL — ABNORMAL LOW (ref 13.0–17.0)
Immature Granulocytes: 0 %
Lymphocytes Relative: 8 %
Lymphs Abs: 0.8 10*3/uL (ref 0.7–4.0)
MCH: 35.1 pg — ABNORMAL HIGH (ref 26.0–34.0)
MCHC: 34.8 g/dL (ref 30.0–36.0)
MCV: 100.8 fL — ABNORMAL HIGH (ref 80.0–100.0)
Monocytes Absolute: 0.3 10*3/uL (ref 0.1–1.0)
Monocytes Relative: 2 %
Neutro Abs: 9.1 10*3/uL — ABNORMAL HIGH (ref 1.7–7.7)
Neutrophils Relative %: 90 %
Platelets: 214 10*3/uL (ref 150–400)
RBC: 3.68 MIL/uL — ABNORMAL LOW (ref 4.22–5.81)
RDW: 13.5 % (ref 11.5–15.5)
WBC: 10.3 10*3/uL (ref 4.0–10.5)
nRBC: 0 % (ref 0.0–0.2)

## 2020-10-26 LAB — COMPREHENSIVE METABOLIC PANEL
ALT: 14 U/L (ref 0–44)
AST: 21 U/L (ref 15–41)
Albumin: 4.1 g/dL (ref 3.5–5.0)
Alkaline Phosphatase: 39 U/L (ref 38–126)
Anion gap: 9 (ref 5–15)
BUN: 24 mg/dL — ABNORMAL HIGH (ref 8–23)
CO2: 24 mmol/L (ref 22–32)
Calcium: 9.1 mg/dL (ref 8.9–10.3)
Chloride: 102 mmol/L (ref 98–111)
Creatinine, Ser: 1.56 mg/dL — ABNORMAL HIGH (ref 0.61–1.24)
GFR, Estimated: 46 mL/min — ABNORMAL LOW (ref >60)
Glucose, Bld: 143 mg/dL — ABNORMAL HIGH (ref 70–99)
Potassium: 4.3 mmol/L (ref 3.5–5.1)
Sodium: 135 mmol/L (ref 135–145)
Total Bilirubin: 0.6 mg/dL (ref 0.3–1.2)
Total Protein: 6.4 g/dL — ABNORMAL LOW (ref 6.5–8.1)

## 2020-10-26 LAB — IRON AND TIBC
Iron: 112 ug/dL (ref 45–182)
Saturation Ratios: 33 % (ref 17.9–39.5)
TIBC: 344 ug/dL (ref 250–450)
UIBC: 232 ug/dL

## 2020-10-26 LAB — FERRITIN: Ferritin: 27 ng/mL (ref 24–336)

## 2020-10-26 LAB — MAGNESIUM: Magnesium: 2 mg/dL (ref 1.7–2.4)

## 2020-10-26 MED ORDER — SODIUM CHLORIDE 0.9 % IV SOLN
Freq: Once | INTRAVENOUS | Status: AC
  Filled 2020-10-26: qty 250

## 2020-10-26 MED ORDER — DEXAMETHASONE 4 MG PO TABS: ORAL_TABLET | ORAL | 0 refills | Status: DC

## 2020-10-26 MED ORDER — HEPARIN SOD (PORK) LOCK FLUSH 100 UNIT/ML IV SOLN
500.0000 [IU] | Freq: Once | INTRAVENOUS | Status: AC | PRN
  Administered 2020-10-26: 500 [IU]
  Filled 2020-10-26: qty 5

## 2020-10-26 MED ORDER — SODIUM CHLORIDE 0.9 % IV SOLN
16.0000 mg/kg | Freq: Once | INTRAVENOUS | Status: AC
  Administered 2020-10-26: 1200 mg via INTRAVENOUS
  Filled 2020-10-26: qty 60

## 2020-10-26 MED ORDER — MONTELUKAST SODIUM 10 MG PO TABS: ORAL_TABLET | ORAL | 1 refills | Status: DC

## 2020-10-26 NOTE — Progress Notes (Signed)
Patient received prescribed treatment in clinic. Tolerated well. Patient stable at discharge. 

## 2020-10-27 LAB — PROTEIN ELECTROPHORESIS, SERUM
A/G Ratio: 1.6 (ref 0.7–1.7)
Albumin ELP: 4 g/dL (ref 2.9–4.4)
Alpha-1-Globulin: 0.1 g/dL (ref 0.0–0.4)
Alpha-2-Globulin: 0.8 g/dL (ref 0.4–1.0)
Beta Globulin: 1 g/dL (ref 0.7–1.3)
Gamma Globulin: 0.5 g/dL (ref 0.4–1.8)
Globulin, Total: 2.5 g/dL (ref 2.2–3.9)
M-Spike, %: 0.2 g/dL — ABNORMAL HIGH
Total Protein ELP: 6.5 g/dL (ref 6.0–8.5)

## 2020-10-27 LAB — KAPPA/LAMBDA LIGHT CHAINS
Kappa free light chain: 6.9 mg/L (ref 3.3–19.4)
Kappa, lambda light chain ratio: 2.09 — ABNORMAL HIGH (ref 0.26–1.65)
Lambda free light chains: 3.3 mg/L — ABNORMAL LOW (ref 5.7–26.3)

## 2020-10-27 LAB — GLUCOSE 6 PHOSPHATE DEHYDROGENASE
G6PDH: 10.1 U/g{Hb} (ref 4.8–15.7)
Hemoglobin: 12.8 g/dL — ABNORMAL LOW (ref 13.0–17.7)

## 2020-11-02 ENCOUNTER — Encounter: Payer: Self-pay | Admitting: Pain Medicine

## 2020-11-02 ENCOUNTER — Ambulatory Visit: Payer: Medicare Other | Attending: Pain Medicine | Admitting: Pain Medicine

## 2020-11-02 ENCOUNTER — Other Ambulatory Visit: Payer: Self-pay

## 2020-11-02 VITALS — BP 110/72 | HR 88 | Temp 97.0°F | Ht 67.0 in | Wt 167.0 lb

## 2020-11-02 DIAGNOSIS — G893 Neoplasm related pain (acute) (chronic): Secondary | ICD-10-CM

## 2020-11-02 DIAGNOSIS — Z978 Presence of other specified devices: Secondary | ICD-10-CM

## 2020-11-02 DIAGNOSIS — S22080S Wedge compression fracture of T11-T12 vertebra, sequela: Secondary | ICD-10-CM

## 2020-11-02 DIAGNOSIS — C7951 Secondary malignant neoplasm of bone: Secondary | ICD-10-CM | POA: Diagnosis not present

## 2020-11-02 DIAGNOSIS — C9001 Multiple myeloma in remission: Secondary | ICD-10-CM | POA: Diagnosis not present

## 2020-11-02 DIAGNOSIS — Z79899 Other long term (current) drug therapy: Secondary | ICD-10-CM

## 2020-11-02 DIAGNOSIS — C410 Malignant neoplasm of bones of skull and face: Secondary | ICD-10-CM

## 2020-11-02 DIAGNOSIS — M545 Low back pain, unspecified: Secondary | ICD-10-CM

## 2020-11-02 DIAGNOSIS — Z451 Encounter for adjustment and management of infusion pump: Secondary | ICD-10-CM

## 2020-11-02 DIAGNOSIS — C801 Malignant (primary) neoplasm, unspecified: Secondary | ICD-10-CM

## 2020-11-02 DIAGNOSIS — G8929 Other chronic pain: Secondary | ICD-10-CM

## 2020-11-02 DIAGNOSIS — Z95828 Presence of other vascular implants and grafts: Secondary | ICD-10-CM

## 2020-11-02 DIAGNOSIS — X58XXXS Exposure to other specified factors, sequela: Secondary | ICD-10-CM | POA: Insufficient documentation

## 2020-11-02 DIAGNOSIS — G894 Chronic pain syndrome: Secondary | ICD-10-CM | POA: Diagnosis present

## 2020-11-02 DIAGNOSIS — F1120 Opioid dependence, uncomplicated: Secondary | ICD-10-CM

## 2020-11-02 DIAGNOSIS — Z79891 Long term (current) use of opiate analgesic: Secondary | ICD-10-CM

## 2020-11-02 DIAGNOSIS — Z9689 Presence of other specified functional implants: Secondary | ICD-10-CM | POA: Diagnosis not present

## 2020-11-02 MED ORDER — NALOXONE HCL 2 MG/2ML IJ SOSY: 1.0000 mg | PREFILLED_SYRINGE | INTRAMUSCULAR | 1 refills | Status: DC | PRN

## 2020-11-02 NOTE — Progress Notes (Signed)
Safety precautions to be maintained throughout the outpatient stay will include: orient to surroundings, keep bed in low position, maintain call bell within reach at all times, provide assistance with transfer out of bed and ambulation.  

## 2020-11-02 NOTE — Patient Instructions (Addendum)
Opioid Overdose Opioids are drugs that are often used to treat pain. Opioids include illegal drugs, such as heroin, as well as prescription pain medicines, such as codeine, morphine, hydrocodone, oxycodone, and fentanyl. An opioid overdose happens when you take too much of an opioid. An overdose may be intentional or accidental and can happen with any type of opioid. The effects of an overdose can be mild, dangerous, or even deadly. Opioid overdose is a medical emergency. What are the causes? This condition may be caused by:  Taking too much of an opioid on purpose.  Taking too much of an opioid by accident.  Using two or more substances that contain opioids at the same time.  Taking an opioid with a substance that affects your heart, breathing, or blood pressure. These include alcohol, tranquilizers, sleeping pills, illegal drugs, and some over-the-counter medicines. This condition may also happen due to an error made by:  A health care provider who prescribes a medicine.  The pharmacist who fills the prescription order. What increases the risk? This condition is more likely in:  Children. They may be attracted to colorful pills. Because of a child's small size, even a small amount of a drug can be dangerous.  Older people. They may be taking many different drugs. Older people may have difficulty reading labels or remembering when they last took their medicine. They may also be more sensitive to the effects of opioids.  People with chronic medical conditions, especially heart, liver, kidney, or neurological diseases.  People who take an opioid for a long period of time.  People who use: ? Illegal drugs. IV heroin is especially dangerous. ? Other substances, including alcohol, while using an opioid.  People who have: ? A history of drug or alcohol abuse. ? Certain mental health conditions. ? A history of previous drug overdoses.  People who take opioids that are not prescribed  for them. What are the signs or symptoms? Symptoms of this condition depend on the type of opioid and the amount that was taken. Common symptoms include:  Sleepiness or difficulty waking from sleep.  Decrease in attention.  Confusion.  Slurred speech.  Slowed breathing and a slow pulse (bradycardia).  Nausea and vomiting.  Abnormally small pupils. Signs and symptoms that require emergency treatment include:  Cold, clammy, and pale skin.  Blue lips and fingernails.  Vomiting.  Gurgling sounds in the throat.  A pulse that is very slow or difficult to detect.  Breathing that is very irregular, slow, noisy, or difficult to detect.  Limp body.  Inability to respond to speech or be awakened from sleep (stupor).  Seizures. How is this diagnosed? This condition is diagnosed based on your symptoms and medical history. It is important to tell your health care provider:  About all of the opioids that you took.  When you took the opioids.  Whether you were drinking alcohol or using marijuana, cocaine, or other drugs. Your health care provider will do a physical exam. This exam may include:  Checking and monitoring your heart rate and rhythm, breathing rate, temperature, and blood pressure (vital signs).  Measuring oxygen levels in your blood.  Checking for abnormally small pupils. You may also have blood tests or urine tests. You may have X-rays if you are having severe breathing problems. How is this treated? This condition requires immediate medical treatment and hospitalization. Treatment is given in the hospital intensive care (ICU) setting. Supporting your vital signs and your breathing is the first step in   treating an opioid overdose. Treatment may also include:  Giving salts and minerals (electrolytes) along with fluids through an IV.  Inserting a breathing tube (endotracheal tube) in your airway to help you breathe if you cannot breathe on your own or you are in  danger of not being able to breathe on your own.  Giving oxygen through a small tube under your nose.  Passing a tube through your nose and into your stomach (nasogastric tube, or NG tube) to empty your stomach.  Giving medicines that: ? Increase your blood pressure. ? Relieve nausea and vomiting. ? Relieve abdominal pain and cramping. ? Reverse the effects of the opioid (naloxone).  Monitoring your heart and oxygen levels.  Ongoing counseling and mental health support if you intentionally overdosed or used an illegal drug. Follow these instructions at home: Medicines  Take over-the-counter and prescription medicines only as told by your health care provider.  Always ask your health care provider about possible side effects and interactions of any new medicine that you start taking.  Keep a list of all the medicines that you take, including over-the-counter medicines. Bring this list with you to all your medical visits. General instructions  Drink enough fluid to keep your urine pale yellow.  Keep all follow-up visits as told by your health care provider. This is important.   How is this prevented?  Read the drug inserts that come with your opioid pain medicines.  Take medicines only as told by your health care provider. Do not take more medicine than you are told. Do not take medicines more frequently than you are told.  Do not drink alcohol or take sedatives when taking opioids.  Do not use illegal or recreational drugs, including cocaine, ecstasy, and marijuana.  Do not take opioid medicines that are not prescribed for you.  Store all medicines in safety containers that are out of the reach of children.  Get help if you are struggling with: ? Alcohol or drug use. ? Depression or another mental health problem. ? Thoughts of hurting yourself or another person.  Keep the phone number of your local poison control center near your phone or in your mobile phone. In the  [place] the hotline of the National Poison Control Center is (800) 222-1222.  If you were prescribed naloxone, make sure you understand how to take it. Contact a health care provider if you:  Need help understanding how to take your pain medicines.  Feel your medicines are too strong.  Are concerned that your pain medicines are not working well for your pain.  Develop new symptoms or side effects when you are taking medicines. Get help right away if:  You or someone else is having symptoms of an opioid overdose. Get help even if you are not sure.  You have serious thoughts about hurting yourself or others.  You have: ? Chest pain. ? Difficulty breathing. ? A loss of consciousness. These symptoms may represent a serious problem that is an emergency. Do not wait to see if the symptoms will go away. Get medical help right away. Call your local emergency services (911 in the [place] Do not drive yourself to the hospital. If you ever feel like you may hurt yourself or others, or have thoughts about taking your own life, get help right away. You can go to your nearest emergency department or call:  Your local emergency services (911 in the [place]  A suicide crisis helpline, such as the National Suicide Prevention Lifeline   at 1-800-273-8255. This is open 24 hours a day. Summary  Opioids are drugs that are often used to treat pain. Opioids include illegal drugs, such as heroin, as well as prescription pain medicines.  An opioid overdose happens when you take too much of an opioid.  Overdoses can be intentional or accidental.  Opioid overdose is very dangerous. It is a life-threatening emergency.  If you or someone you know is experiencing an opioid overdose, get help right away. This information is not intended to replace advice given to you by your health care provider. Make sure you discuss any questions you have with your health care provider. Document Revised: 09/12/2018 Document  Reviewed: 09/12/2018 Elsevier Patient Education  2021 [employer] Naloxone injection What is this medicine? NALOXONE (nal OX one) is a narcotic blocker. It is used to treat narcotic (opioid) drug overdose. It is used to temporarily reverse the effects of opioid medicines. This medicine has no effect in people who are not taking opioid medicines. This medicine may be used for other purposes; ask your health care provider or pharmacist if you have questions. COMMON BRAND NAME(S): EVZIO, Narcan What should I tell my health care provider before I take this medicine? They need to know if you have any of these conditions:  drug abuse or addiction  heart disease  an unusual or allergic reaction to naloxone, other medicines, foods, dyes, or preservatives  pregnant or trying to get pregnantbreast-feeding How should I use this medicine? This medicine may be administered in a hospital, clinic, or can be used by the public to give aid to a person who has overdosed until emergency medical help is available. This medicine is for injection into the outer thigh. It can be injected through clothing if needed. Get emergency medical help right away after giving the first dose of this medicine, even if the person wakes up. You should be familiar with how to recognize the signs and symptoms of a narcotic overdose. Administer according to the printed instructions on the device label or the electronic voice instructions. You should practice using the Trainer injector before this medicine is needed. Talk to your pediatrician regarding the use of this medicine in children. While this drug may be prescribed for children as young as newborn for selected conditions, precautions do apply. For infants less than 1 year of age, pinch the thigh muscle while administering. Overdosage: If you think you have taken too much of this medicine contact a poison control center or emergency room at once. NOTE: This medicine is only  for you. Do not share this medicine with others. What if I miss a dose? This does not apply. What may interact with this medicine? This medicine is only used during an emergency. No interactions are expected during emergency use. This list may not describe all possible interactions. Give your health care provider a list of all the medicines, herbs, non-prescription drugs, or dietary supplements you use. Also tell them if you smoke, drink alcohol, or use illegal drugs. Some items may interact with your medicine. What should I watch for while using this medicine? Keep this medicine ready for use in the case of a narcotic overdose. Make sure that you have the phone number of your doctor or health care professional and local hospital ready. You may need to have additional doses of this medicine. Each injector contains a single dose. Some emergencies may require additional doses. After use, bring the treated person to the nearest hospital or call 911. Make   sure the treating health care professional knows that the person has received an injection of this medicine. You will receive additional instructions on what to do during and after use of this medicine before an emergency occurs. What side effects may I notice from receiving this medicine? Side effects that you should report to your doctor or health care professional as soon as possible:  allergic reactions like skin rash, itching or hives, swelling of the face, lips, or tongue  breathing problems  fast, irregular heartbeat  high blood pressure  pain that was controlled by narcotic pain medicine  seizures Side effects that usually do not require medical attention (report to your doctor or health care professional if they continue or are bothersome):  anxious  chills  diarrhea  fever  nausea, vomiting  sweating This list may not describe all possible side effects. Call your doctor for medical advice about side effects. You may report  side effects to FDA at 1-800-FDA-1088. Where should I keep my medicine? Keep out of the reach of children. Store at room temperature between 15 and 25 degrees C (59 and 77 degrees F). If you are using this medicine at home, you will be instructed on how to store this medicine. Keep this medicine in its outer case until ready to use. Occasionally check the solution through the viewing window of the injector. The solution should be clear. If it is discolored, cloudy, or contains solid particles, replace it with a new injector. Remember to check the expiration date of this medicine regularly. Throw away any unused medicine after the expiration date. NOTE: This sheet is a summary. It may not cover all possible information. If you have questions about this medicine, talk to your doctor, pharmacist, or health care provider.  2021 Elsevier/Gold Standard (2015-10-05 16:45:38)  

## 2020-11-02 NOTE — Progress Notes (Signed)
PROVIDER NOTE: Information contained herein reflects review and annotations entered in association with encounter. Interpretation of such information and data should be left to medically-trained personnel. Information provided to patient can be located elsewhere in the medical record under "Patient Instructions". Document created using STT-dictation technology, any transcriptional errors that may result from process are unintentional.    Patient: Logan Perez  Service Category: Procedure  Provider: Gaspar Cola, MD  DOB: July 25, 1946  DOS: 11/02/2020  Location: [facility]  MRN: [id number]  Setting: Ambulatory - outpatient  Referring Provider: Sofie Hartigan, MD  Type: Established Patient  Specialty: Interventional Pain Management  PCP: Sofie Hartigan, MD   Primary Reason for Visit: Interventional Pain Management Treatment. CC: Back Pain  Procedure:          Intrathecal Drug Delivery System (IDDS):  Type: Reservoir Refill [phone]) No rate change Region: Abdominal Laterality: Right  Type of Pump: Medtronic Synchromed II Delivery Route: Intrathecal Type of Pain Treated: Neuropathic/Nociceptive Primary Medication Class: Opioid/opiate  Medication, Concentration, Infusion Program, & Delivery Rate: Please see scanned programming printout.   Indications: 1. Chronic pain syndrome   2. 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 Cancer associated pain   4. Malignant neoplasm of skull ([place])   5. 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 Compression fracture of T12 vertebra (HCC) (70-75% magnitude) (with mild retropulsion)   7. Chronic low back pain (Bilateral) w/o sciatica   8. Presence of intrathecal pump   9. Presence of implanted infusion pump (Medtronic, programmable, intrathecal pump)   10. Encounter for adjustment or management of infusion pump   11. Pharmacologic therapy   12. Long term current use of opiate  analgesic   13. Uncomplicated opioid dependence ([place])    Pain Assessment: Self-Reported Pain Score: 4 /10             Reported level is compatible with observation.        Pharmacotherapy Assessment  Analgesic: Intrathecal PF-Fentanyl 502.2 mcg/day (20.9 mcg/hr) MME/day: 50.16 mg/day.   Monitoring: [place] PMP: PDMP reviewed during this encounter.       Pharmacotherapy: No side-effects or adverse reactions reported. Compliance: No problems identified. Effectiveness: Clinically acceptable. Plan: Refer to "POC".  UDS: No results found for: SUMMARY  Intrathecal Pump Therapy Assessment  Manufacturer: Medtronic Synchromed Type: Programmable Volume: 40 mL reservoir MRI compatibility: Yes   Drug content:  Primary Medication Class:Opioid Primary Medication:PF-Fentanyl(1500 mcg/mL) Secondary Medication:PF-Bupivacaine(30 mg/mL) Other Medication:PF-Clonidine(300 mcg/mL)   Programming:  Type: Simple continuous. See pump readout for details.   Changes:  Medication Change: None at this point Rate Change: No change in rate  Reported side-effects or adverse reactions: None reported  Effectiveness: Described as relatively effective, allowing for increase in activities of daily living (ADL) Clinically meaningful improvement in function (CMIF): Sustained CMIF goals met  Plan: Pump refill today  Pre-op H&P Assessment:  Mr. [name] is a 75 y.o. (year old), male patient, seen today for interventional treatment. He  has a past surgical history that includes Knee arthroscopy (Left, 1992); Pain pump implantation (2012); stem cell implant (2008); Cataract extraction w/PHACO (Right, 05/02/2016); Cataract extraction w/PHACO (Left, 05/16/2016); Limbal stem cell transplant; PORTA CATH INSERTION (N/A, 12/04/2016); Colonoscopy with propofol (N/A, 03/22/2017); Esophagogastroduodenoscopy (egd) with propofol (03/22/2017); Eye surgery; Back surgery (1994); and Pain pump implantation (N/A, 09/04/2017). Mr.  [name] has a current medication list which includes the following prescription(s): acetaminophen, baclofen, calcium citrate-vitamin d, cetirizine, vitamin  d3, daratumumab, diltiazem, diphenhydramine, eliquis, carboxymethylcellulose sodium, loperamide, loratadine, lovastatin, montelukast, multiple vitamins-iron, NON FORMULARY, omeprazole, ondansetron, tamsulosin, valacyclovir, vitamin I-29, bismuth subsalicylate, calcium-vitamin d, dexamethasone, diltiazem, fluticasone, potassium chloride sa, and [DISCONTINUED] PAIN MANAGEMENT IT PUMP REFILL. His primarily concern today is the Back Pain  Initial Vital Signs:  Pulse/HCG Rate: 88  Temp: (!) 97 F (36.1 C) Resp:   BP: 110/72 SpO2: 99 %  BMI: Estimated body mass index is 26.16 kg/m as calculated from the following:   Height as of this encounter: 5' 7" (1.702 m).   Weight as of this encounter: 167 lb (75.8 kg).  Risk Assessment: Allergies: Reviewed. He is allergic to azithromycin, bee pollen, pollen extract, zoledronic acid, and rivaroxaban.  Allergy Precautions: None required Coagulopathies: Reviewed. None identified.  Blood-thinner therapy: None at this time Active Infection(s): Reviewed. None identified. Mr. [name] is afebrile  Site Confirmation: Mr. [name] was asked to confirm the procedure and laterality before marking the site Procedure checklist: Completed Consent: Before the procedure and under the influence of no sedative(s), amnesic(s), or anxiolytics, the patient was informed of the treatment options, risks and possible complications. To fulfill our ethical and legal obligations, as recommended by the American Medical Association's Code of Ethics, I have informed the patient of my clinical impression; the nature and purpose of the treatment or procedure; the risks, benefits, and possible complications of the intervention; the alternatives, including doing nothing; the risk(s) and benefit(s) of the alternative treatment(s) or  procedure(s); and the risk(s) and benefit(s) of doing nothing.  [name] was provided with information about the general risks and possible complications associated with most interventional procedures. These include, but are not limited to: failure to achieve desired goals, infection, bleeding, organ or nerve damage, allergic reactions, paralysis, and/or death.  In addition, he was informed of those risks and possible complications associated to this particular procedure, which include, but are not limited to: damage to the implant; failure to decrease pain; local, systemic, or serious CNS infections, intraspinal abscess with possible cord compression and paralysis, or life-threatening such as meningitis; bleeding; organ damage; nerve injury or damage with subsequent sensory, motor, and/or autonomic system dysfunction, resulting in transient or permanent pain, numbness, and/or weakness of one or several areas of the body; allergic reactions, either minor or major life-threatening, such as anaphylactic or anaphylactoid reactions.  Furthermore, Mr. Kottke was informed of those risks and complications associated with the medications. These include, but are not limited to: allergic reactions (i.e.: anaphylactic or anaphylactoid reactions); endorphine suppression; bradycardia and/or hypotension; water retention and/or peripheral vascular relaxation leading to lower extremity edema and possible stasis ulcers; respiratory depression and/or shortness of breath; decreased metabolic rate leading to weight gain; swelling or edema; medication-induced neural toxicity; particulate matter embolism and blood vessel occlusion with resultant organ, and/or nervous system infarction; and/or intrathecal granuloma formation with possible spinal cord compression and permanent paralysis.  Before refilling the pump Mr. Chittum was informed that some of the medications used in the devise may not be FDA approved for such use and  therefore it constitutes an off-label use of the medications.  Finally, he was informed that Medicine is not an exact science; therefore, there is also the possibility of unforeseen or unpredictable risks and/or possible complications that may result in a catastrophic outcome. The patient indicated having understood very clearly. We have given the patient no guarantees and we have made no promises. Enough time was given to the patient to ask questions, all of which were answered to  the patient's satisfaction. Mr. Effinger has indicated that he wanted to continue with the procedure. Attestation: I, the ordering provider, attest that I have discussed with the patient the benefits, risks, side-effects, alternatives, likelihood of achieving goals, and potential problems during recovery for the procedure that I have provided informed consent. Date  Time: 11/02/2020  1:05 PM  Pre-Procedure Preparation:  Monitoring: As per clinic protocol. Respiration, ETCO2, SpO2, BP, heart rate and rhythm monitor placed and checked for adequate function Safety Precautions: Patient was assessed for positional comfort and pressure points before starting the procedure. Time-out: I initiated and conducted the "Time-out" before starting the procedure, as per protocol. The patient was asked to participate by confirming the accuracy of the "Time Out" information. Verification of the correct person, site, and procedure were performed and confirmed by me, the nursing staff, and the patient. "Time-out" conducted as per Joint Commission's Universal Protocol (UP.01.01.01). Time: 1314  Description of Procedure:          Position: Supine Target Area: Central-port of intrathecal pump. Approach: Anterior, 90 degree angle approach. Area Prepped: Entire Area around the pump implant. DuraPrep (Iodine Povacrylex [0.7% available iodine] and Isopropyl Alcohol, 74% w/w) Safety Precautions: Aspiration looking for blood return was conducted  prior to all injections. At no point did we inject any substances, as a needle was being advanced. No attempts were made at seeking any paresthesias. Safe injection practices and needle disposal techniques used. Medications properly checked for expiration dates. SDV (single dose vial) medications used. Description of the Procedure: Protocol guidelines were followed. Two nurses trained to do implant refills were present during the entire procedure. The refill medication was checked by both healthcare providers as well as the patient. The patient was included in the "Time-out" to verify the medication. The patient was placed in position. The pump was identified. The area was prepped in the usual manner. The sterile template was positioned over the pump, making sure the side-port location matched that of the pump. Both, the pump and the template were held for stability. The needle provided in the Medtronic Kit was then introduced thru the center of the template and into the central port. The pump content was aspirated and discarded volume documented. The new medication was slowly infused into the pump, thru the filter, making sure to avoid overpressure of the device. The needle was then removed and the area cleansed, making sure to leave some of the prepping solution back to take advantage of its long term bactericidal properties. The pump was interrogated and programmed to reflect the correct medication, volume, and dosage. The program was printed and taken to the physician for approval. Once checked and signed by the physician, a copy was provided to the patient and another scanned into the EMR. Vitals:   11/02/20 1302  BP: 110/72  Pulse: 88  Temp: (!) 97 F (36.1 C)  SpO2: 99%  Weight: 167 lb (75.8 kg)  Height: 5' 7" (1.702 m)    Start Time: 1314 hrs. End Time: 1326 hrs. Materials & Medications: Medtronic Refill Kit Medication(s): Please see chart orders for details.  Imaging Guidance:           Type of Imaging Technique: None used Indication(s): N/A Exposure Time: No patient exposure Contrast: None used. Fluoroscopic Guidance: N/A Ultrasound Guidance: N/A Interpretation: N/A  Antibiotic Prophylaxis:   Anti-infectives (From admission, onward)   None     Indication(s): None identified  Post-operative Assessment:  Post-procedure Vital Signs:  Pulse/HCG Rate: 88  Temp: [name])  97 F (36.1 C) Resp:   BP: 110/72 SpO2: 99 %  EBL: None  Complications: No immediate post-treatment complications observed by team, or reported by patient.  Note: The patient tolerated the entire procedure well. A repeat set of vitals were taken after the procedure and the patient was kept under observation following institutional policy, for this type of procedure. Post-procedural neurological assessment was performed, showing return to baseline, prior to discharge. The patient was provided with post-procedure discharge instructions, including a section on how to identify potential problems. Should any problems arise concerning this procedure, the patient was given instructions to immediately contact us, at any time, without hesitation. In any case, we plan to contact the patient by telephone for a follow-up status report regarding this interventional procedure.  Comments:  No additional relevant information.  Plan of Care  Orders:  Orders Placed This Encounter  Procedures  . PUMP REFILL    Maintain Protocol by having two(2) healthcare providers during procedure and programming.    Scheduling Instructions:     Please refill intrathecal pump today.    Order Specific Question:   Where will this procedure be performed?    Answer:   [facility]  . PUMP REFILL    Whenever possible schedule on a procedure today.    Standing Status:   Future    Standing Expiration Date:   04/01/2021    Scheduling Instructions:     Please schedule intrathecal pump refill based on pump programming. Avoid  schedule intervals of more than 120 days (4 months).    Order Specific Question:   Where will this procedure be performed?    Answer:   [facility]  . Informed Consent Details: Physician/Practitioner Attestation; Transcribe to consent form and obtain patient signature    Transcribe to consent form and obtain patient signature.    Order Specific Question:   Physician/Practitioner attestation of informed consent for procedure/surgical case    Answer:   I, the physician/practitioner, attest that I have discussed with the patient the benefits, risks, side effects, alternatives, likelihood of achieving goals and potential problems during recovery for the procedure that I have provided informed consent.    Order Specific Question:   Procedure    Answer:   Intrathecal pump refill    Order Specific Question:   Physician/Practitioner performing the procedure    Answer:   Attending Physician: Kathlen Brunswick. Dossie Arbour, MD & designated trained staff    Order Specific Question:   Indication/Reason    Answer:   Chronic Pain Syndrome (G89.4), presence of an intrathecal pump (Z97.8)   Chronic Opioid Analgesic:  Intrathecal PF-Fentanyl 502.2 mcg/day (20.9 mcg/hr) MME/day: 50.16 mg/day.   Medications ordered for procedure: No orders of the defined types were placed in this encounter.  Medications administered: Lexine Baton "Rick" had no medications administered during this visit.  See the medical record for exact dosing, route, and time of administration.  Follow-up plan:   Return for Pump Refill (Max:39mo.       Interventional management options:  Considering:   Palliative intrathecal pump refills  No other procedures currently under consideration   Palliative PRN treatment(s):   Palliative management of intrathecal pump     Recent Visits No visits were found meeting these conditions. Showing recent visits within past 90 days and meeting all other requirements Today's Visits Date  Type Provider Dept  11/02/20 Procedure visit NMilinda Pointer MD [facility]  Showing today's visits and meeting all other requirements  Future Appointments No visits were found meeting these conditions. Showing future appointments within next 90 days and meeting all other requirements  Disposition: Discharge home  Discharge (Date  Time): 11/02/2020; 1335 hrs.   Primary Care Physician: Sofie Hartigan, MD Location: [facility] Note by: Gaspar Cola, MD Date: 11/02/2020; Time: 1:44 PM  Disclaimer:  Medicine is not an Chief Strategy Officer. The only guarantee in medicine is that nothing is guaranteed. It is important to note that the decision to proceed with this intervention was based on the information collected from the patient. The Data and conclusions were drawn from the patient's questionnaire, the interview, and the physical examination. Because the information was provided in large part by the patient, it cannot be guaranteed that it has not been purposely or unconsciously manipulated. Every effort has been made to obtain as much relevant data as possible for this evaluation. It is important to note that the conclusions that lead to this procedure are derived in large part from the available data. Always take into account that the treatment will also be dependent on availability of resources and existing treatment guidelines, considered by other Pain Management Practitioners as being common knowledge and practice, at the time of the intervention. For Medico-Legal purposes, it is also important to point out that variation in procedural techniques and pharmacological choices are the acceptable norm. The indications, contraindications, technique, and results of the above procedure should only be interpreted and judged by a Board-Certified Interventional Pain Specialist with extensive familiarity and expertise in the same exact procedure and technique.

## 2020-11-02 NOTE — Addendum Note (Signed)
Addended by: Milinda Pointer A on: 11/02/2020 01:47 PM   Modules accepted: Orders

## 2020-11-03 ENCOUNTER — Telehealth: Payer: Self-pay | Admitting: *Deleted

## 2020-11-03 NOTE — Telephone Encounter (Signed)
No problems post IT pump fill. 

## 2020-11-05 MED FILL — Medication: INTRATHECAL | Qty: 1 | Status: AC

## 2020-11-10 ENCOUNTER — Ambulatory Visit
Admission: RE | Admit: 2020-11-10 | Discharge: 2020-11-10 | Disposition: A | Discharge status: Home / Self Care | Payer: Medicare Other | Attending: Hematology and Oncology | Admitting: Hematology and Oncology

## 2020-11-10 ENCOUNTER — Ambulatory Visit
Admission: RE | Admit: 2020-11-10 | Discharge: 2020-11-10 | Disposition: A | Discharge status: Home / Self Care | Payer: Medicare Other | Source: Ambulatory Visit | Attending: Hematology and Oncology | Admitting: Hematology and Oncology

## 2020-11-10 ENCOUNTER — Other Ambulatory Visit: Payer: Self-pay

## 2020-11-10 DIAGNOSIS — C9000 Multiple myeloma not having achieved remission: Secondary | ICD-10-CM | POA: Diagnosis not present

## 2020-11-29 ENCOUNTER — Inpatient Hospital Stay: Payer: Medicare Other

## 2020-11-30 ENCOUNTER — Telehealth: Payer: Self-pay

## 2020-11-30 ENCOUNTER — Other Ambulatory Visit: Payer: Self-pay

## 2020-11-30 ENCOUNTER — Inpatient Hospital Stay: Payer: Medicare Other | Attending: Hematology and Oncology

## 2020-11-30 DIAGNOSIS — C9002 Multiple myeloma in relapse: Secondary | ICD-10-CM | POA: Insufficient documentation

## 2020-11-30 DIAGNOSIS — K529 Noninfective gastroenteritis and colitis, unspecified: Secondary | ICD-10-CM

## 2020-11-30 DIAGNOSIS — R110 Nausea: Secondary | ICD-10-CM | POA: Diagnosis not present

## 2020-11-30 DIAGNOSIS — R197 Diarrhea, unspecified: Secondary | ICD-10-CM | POA: Diagnosis not present

## 2020-11-30 LAB — GASTROINTESTINAL PANEL BY PCR, STOOL (REPLACES STOOL CULTURE)
Adenovirus F40/41: DETECTED — AB
Astrovirus: NOT DETECTED
Campylobacter species: NOT DETECTED
Cryptosporidium: NOT DETECTED
Cyclospora cayetanensis: NOT DETECTED
Entamoeba histolytica: NOT DETECTED
Enteroaggregative E coli (EAEC): NOT DETECTED
Enteropathogenic E coli (EPEC): DETECTED — AB
Enterotoxigenic E coli (ETEC): NOT DETECTED
Giardia lamblia: NOT DETECTED
Norovirus GI/GII: NOT DETECTED
Plesimonas shigelloides: NOT DETECTED
Rotavirus A: NOT DETECTED
Salmonella species: NOT DETECTED
Sapovirus (I, II, IV, and V): NOT DETECTED
Shiga like toxin producing E coli (STEC): NOT DETECTED
Shigella/Enteroinvasive E coli (EIEC): NOT DETECTED
Vibrio cholerae: NOT DETECTED
Vibrio species: NOT DETECTED
Yersinia enterocolitica: NOT DETECTED

## 2020-11-30 LAB — C DIFFICILE QUICK SCREEN W PCR REFLEX
C Diff antigen: NEGATIVE
C Diff interpretation: NOT DETECTED
C Diff toxin: NEGATIVE

## 2020-11-30 NOTE — Telephone Encounter (Signed)
Patient aware of GI lab results and that we are still waiting for the cdiff results. Advised patient that we will let him know the plan for treatment once Dr C talks to the infection disease.

## 2020-11-30 NOTE — Telephone Encounter (Signed)
Patient states that he has Cdiff. Diarrhea started around 5 days ago, very runny and going more often than usual. Imodium and Pepto not helping.   Patient will come in today to do cdiff test per Dr Mike Gip

## 2020-12-02 ENCOUNTER — Telehealth: Payer: Self-pay

## 2020-12-02 ENCOUNTER — Telehealth: Payer: Self-pay | Admitting: Hematology and Oncology

## 2020-12-02 NOTE — Telephone Encounter (Signed)
Re:  Diarrhea  Patient called on 11/30/2020 regarding a change in stool output.  Stool quality has changed to runny and has been between 1 and 2 stools a day.  He denies any fevers or bloody stool.    He initially stated that he went out to dinner last Saturday and ate steak.  His wife had a queasy stomach.  He had some diarrhea after eating out.  However he believes diarrhea may have started earlier.  At the time of our conversation, he felt comfortable; his stomach had "settle down".  He feels "queasy from time to time".  Imodium is not very helpful.  Stool was sent for C. difficile as well as GI panel by PCR.  C. difficile was negative.  Stool revealed enteropathogenic E. coli (EPEC) as well as adenovirus.  I spoke with Dr. Steva Ready from infectious disease.  Antibiotics are typically not given for enteropathogenic E. coli.  Antibiotics can reduce the duration of the diarrhea associated with EPEC.  In most cases, diarrhea  spontaneously resolves.  Antibiotics can select out resistant organisms.  If antibiotics are given, she recommended azithromycin.  I discussed consideration of azithromycin 500 mg a day x3 days or ciprofloxacin 500 mg twice daily for 3 days.  Patient stated he could not take azithromycin.  As he appeared to be improving, decision was made to hold off on antibiotic coverage.  He will be contacted for follow-up.  We discussed support with fluids, electrolytes and nutrition.    Lequita Asal, MD

## 2020-12-02 NOTE — Telephone Encounter (Signed)
Patient reports no bowel movement yesterday and 1 solid bowel movement today.  He has been avoiding certain foods such as read meat and chocolate and will slowly introduce foods back in diet to monitor any symptoms that may occur.

## 2020-12-06 ENCOUNTER — Other Ambulatory Visit: Payer: Self-pay | Admitting: Hematology and Oncology

## 2020-12-06 DIAGNOSIS — C9001 Multiple myeloma in remission: Secondary | ICD-10-CM

## 2020-12-13 ENCOUNTER — Inpatient Hospital Stay: Payer: Medicare Other | Attending: Hematology and Oncology

## 2020-12-13 ENCOUNTER — Encounter: Payer: Self-pay | Admitting: Hematology and Oncology

## 2020-12-13 ENCOUNTER — Other Ambulatory Visit: Payer: Self-pay | Admitting: *Deleted

## 2020-12-13 ENCOUNTER — Ambulatory Visit: Payer: Medicare Other

## 2020-12-13 ENCOUNTER — Telehealth: Payer: Self-pay

## 2020-12-13 ENCOUNTER — Other Ambulatory Visit: Payer: Self-pay | Admitting: Hematology and Oncology

## 2020-12-13 ENCOUNTER — Inpatient Hospital Stay (HOSPITAL_BASED_OUTPATIENT_CLINIC_OR_DEPARTMENT_OTHER): Payer: Medicare Other | Admitting: Hematology and Oncology

## 2020-12-13 ENCOUNTER — Other Ambulatory Visit: Payer: Self-pay

## 2020-12-13 VITALS — BP 95/63 | HR 75 | Temp 97.0°F | Resp 18 | Wt 163.8 lb

## 2020-12-13 VITALS — BP 144/78 | HR 84 | Temp 97.0°F | Wt 163.0 lb

## 2020-12-13 DIAGNOSIS — E860 Dehydration: Secondary | ICD-10-CM | POA: Diagnosis not present

## 2020-12-13 DIAGNOSIS — C9000 Multiple myeloma not having achieved remission: Secondary | ICD-10-CM | POA: Diagnosis not present

## 2020-12-13 DIAGNOSIS — R197 Diarrhea, unspecified: Secondary | ICD-10-CM

## 2020-12-13 DIAGNOSIS — C9002 Multiple myeloma in relapse: Secondary | ICD-10-CM | POA: Insufficient documentation

## 2020-12-13 DIAGNOSIS — K5289 Other specified noninfective gastroenteritis and colitis: Secondary | ICD-10-CM | POA: Insufficient documentation

## 2020-12-13 DIAGNOSIS — Z5112 Encounter for antineoplastic immunotherapy: Secondary | ICD-10-CM | POA: Diagnosis present

## 2020-12-13 DIAGNOSIS — K529 Noninfective gastroenteritis and colitis, unspecified: Secondary | ICD-10-CM

## 2020-12-13 LAB — COMPREHENSIVE METABOLIC PANEL
ALT: 20 U/L (ref 0–44)
AST: 21 U/L (ref 15–41)
Albumin: 4.1 g/dL (ref 3.5–5.0)
Alkaline Phosphatase: 45 U/L (ref 38–126)
Anion gap: 12 (ref 5–15)
BUN: 17 mg/dL (ref 8–23)
CO2: 23 mmol/L (ref 22–32)
Calcium: 9.3 mg/dL (ref 8.9–10.3)
Chloride: 103 mmol/L (ref 98–111)
Creatinine, Ser: 1.33 mg/dL — ABNORMAL HIGH (ref 0.61–1.24)
GFR, Estimated: 56 mL/min — ABNORMAL LOW (ref >60)
Glucose, Bld: 178 mg/dL — ABNORMAL HIGH (ref 70–99)
Potassium: 3.7 mmol/L (ref 3.5–5.1)
Sodium: 138 mmol/L (ref 135–145)
Total Bilirubin: 0.8 mg/dL (ref 0.3–1.2)
Total Protein: 6.4 g/dL — ABNORMAL LOW (ref 6.5–8.1)

## 2020-12-13 LAB — C DIFFICILE QUICK SCREEN W PCR REFLEX
C Diff antigen: NEGATIVE
C Diff interpretation: NOT DETECTED
C Diff toxin: NEGATIVE

## 2020-12-13 LAB — CBC WITH DIFFERENTIAL/PLATELET
Abs Immature Granulocytes: 0.04 10*3/uL (ref 0.00–0.07)
Basophils Absolute: 0.1 10*3/uL (ref 0.0–0.1)
Basophils Relative: 1 %
Eosinophils Absolute: 0.1 10*3/uL (ref 0.0–0.5)
Eosinophils Relative: 1 %
HCT: 37.3 % — ABNORMAL LOW (ref 39.0–52.0)
Hemoglobin: 12.9 g/dL — ABNORMAL LOW (ref 13.0–17.0)
Immature Granulocytes: 0 %
Lymphocytes Relative: 23 %
Lymphs Abs: 2.1 10*3/uL (ref 0.7–4.0)
MCH: 34.2 pg — ABNORMAL HIGH (ref 26.0–34.0)
MCHC: 34.6 g/dL (ref 30.0–36.0)
MCV: 98.9 fL (ref 80.0–100.0)
Monocytes Absolute: 0.7 10*3/uL (ref 0.1–1.0)
Monocytes Relative: 8 %
Neutro Abs: 6.3 10*3/uL (ref 1.7–7.7)
Neutrophils Relative %: 67 %
Platelets: 241 10*3/uL (ref 150–400)
RBC: 3.77 MIL/uL — ABNORMAL LOW (ref 4.22–5.81)
RDW: 13 % (ref 11.5–15.5)
WBC: 9.3 10*3/uL (ref 4.0–10.5)
nRBC: 0 % (ref 0.0–0.2)

## 2020-12-13 MED ORDER — HEPARIN SOD (PORK) LOCK FLUSH 100 UNIT/ML IV SOLN
500.0000 [IU] | Freq: Once | INTRAVENOUS | Status: AC
  Administered 2020-12-13: 500 [IU] via INTRAVENOUS
  Filled 2020-12-13: qty 5

## 2020-12-13 MED ORDER — SODIUM CHLORIDE 0.9 % IV SOLN
Freq: Once | INTRAVENOUS | Status: AC
  Filled 2020-12-13: qty 250

## 2020-12-13 NOTE — Telephone Encounter (Signed)
Nutrition Assessment   Reason for Assessment:  Received message from [facility], Triage RN that patient had called requesting foods to eat with diarrhea   ASSESSMENT:  75 year old male with multiple myeloma s/p autologous stem cell transplant and relapse receiving daratumumab.  Past medical history of MI, afib, HTN, pulmonary embolism, CKD stage II.    Spoke with patient via phone.  Noted positive for ecoli.  Patient reports that his diarrhea has gotten worse since the first of March was previously having 2 loose stool per day, now 4-5 per day of loose stools. Feels weak and dull stomach ache and gas.  Denies vomiting, fever or blood in stool.  Reports that he has trying to eat a bland diet (toast, crackers, rice, eggs).  Has been drinking gatorade, water, gingerale.     Medications: reviewed   Labs: reviewed   Anthropometrics:   Height: 67 inches Weight: 167 lb 11/02/2020 BMI: 26 Stable weight   NUTRITION DIAGNOSIS: Food and nutrition related knowledge deficit related to diarrhea as evidenced by requesting foods to eat with diarrhea   INTERVENTION:  Concerned that patient needs to be evaluated by medical oncology team. Message sent to Dr Mike Gip with increased diarrhea and weakness. Discussed foods recommended to eat with diarrhea.  Handout provided to patient.  Samples of diary free shakes (ensure clear, orgain and Dillard Essex) left at [facility] location for patient to pick at when able.  Contact information provided   MONITORING, EVALUATION, GOAL: weight trends, intake   Next Visit: as needed  Jaycie Kregel B. Zenia Resides, [place], [place] Registered Dietitian [phone] (mobile)

## 2020-12-13 NOTE — Progress Notes (Signed)
[facility]  [address] [place], [place] [address] Phone: [phone]  Fax: [phone]   Clinic Day:  12/13/2020  Referring physician: Sofie Hartigan, MD  Chief Complaint: Logan Perez is a 75 y.o. 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  HPI: The patient was last seen in the medical oncology clinic on 10/27/2019. At that time,  he felt "alright".  He denied any bone pain or interval infections.  He had chronic well managed diarrhea. Hematocrit was 37.1, hemoglobin 12.9, MCV 100.8, platelets 214,000, WBC 10,300. BUN was 24. Creatinine was 1.56 (CrCl 46 ml/min). Total protein was 6.4. Ferritin was 27 with an iron saturation of 33% and a TIBC of 344. Magnesium was 2.0. M spike was 0.2 gm/dL. Kappa free light chains were 6.9, lambda free light chains 3.3, ratio 2.09. He received cycle #50 daratumumab.  Bone survey on 11/10/2020 revealed no focal lytic lesion identified. There were stable compression fractures of the T5, T10, and T12.  The patient called clinic on 11/30/2020 and reported worsening diarrhea. Stool revealed enteropathogenic E. coli (EPEC) as well as adenovirus. The case was discussed with Dr. Vern Claude. The decision was made to hold off on antibiotics as his symptoms appeared to be improving.  The patient spoke to Jennet Maduro, RD on 12/13/2020 by phone. His diarrhea only improved for about 2 days and then it worsened again. He was having 4-5 bowel movements per day. He also felt weak and had a dull stomach ache. He was to follow-up with oncology.  During the interim, he has been okay. The patient had five episodes of diarrhea yesterday and four the day before that. He has had two today. His diarrhea usually starts around 12:30-1:30 pm. He describes his bowel movements as runny to watery. Throughout the day, they become more watery. Sometimes the diarrhea occurs  right after eating, sometimes it doesn't. He denies blood or mucous in the stool.   He has not tried imodium or pepto bismol lately. He was taking them, but about 2 weeks ago, they stopped working altogether.  The patient was also having a sharp pain on his left side but does not think it was his kidney. He has constant abdominal discomfort and queasiness. He reports mild shortness of breath and lightheadedness at night. He is drinking a lot of fluids and his urine is normal. He denies vomiting, fever, and any falls.   Past Medical History:  Diagnosis Date  . Anxiety   . Atrial fibrillation ([place])   . BPH (benign prostatic hyperplasia)   . Complication of anesthesia    BAD HEADACHE NIGHT OF FIRST CATARACT  . Compression fracture of lumbar vertebra ([place])   . Difficulty voiding   . Dysrhythmia    A FIB  . Elevated PSA   . GERD (gastroesophageal reflux disease)   . Hearing aid worn    bilateral  . History of kidney stones   . HLD (hyperlipidemia)   . HOH (hard of hearing)   . Hypertension   . Multiple myeloma ([place])   . Neuropathy    feet. 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  . Pain    BACK  . Palpitations   . Pneumonia   . Pulmonary embolism ([place])   . Sepsis ([place])   . Stroke [facility])    TIA, detected on CT scan. pt was unaware    Past Surgical History:  Procedure Laterality Date  .  BACK SURGERY  1994  . CATARACT EXTRACTION W/PHACO Right 05/02/2016   Procedure: CATARACT EXTRACTION PHACO AND INTRAOCULAR LENS PLACEMENT (IOC);  Surgeon: Birder Robson, MD;  Location: [facility] ORS;  Service: Ophthalmology;  Laterality: Right;  [place] 1.06 AP% 20.6 CDE 13.70 FLUID PACK LOT # [id number]  . CATARACT EXTRACTION W/PHACO Left 05/16/2016   Procedure: CATARACT EXTRACTION PHACO AND INTRAOCULAR LENS PLACEMENT (IOC);  Surgeon: Birder Robson, MD;  Location: [facility] ORS;  Service: Ophthalmology;  Laterality: Left;  [place] 01:43 AP% 19.8 CDE 20.45 FLUID PACK LOT #[id number] H  . COLONOSCOPY WITH PROPOFOL N/A 03/22/2017    Procedure: COLONOSCOPY WITH PROPOFOL;  Surgeon: Lucilla Lame, MD;  Location: [place];  Service: Endoscopy;  Laterality: N/A;  has port  . ESOPHAGOGASTRODUODENOSCOPY (EGD) WITH PROPOFOL  03/22/2017   Procedure: ESOPHAGOGASTRODUODENOSCOPY (EGD) WITH PROPOFOL;  Surgeon: Lucilla Lame, MD;  Location: [place];  Service: Endoscopy;;  . EYE SURGERY    . KNEE ARTHROSCOPY Left 1992  . LIMBAL STEM CELL TRANSPLANT    . PAIN PUMP IMPLANTATION  2012  . PAIN PUMP IMPLANTATION N/A 09/04/2017   Procedure: INTRATHECAL PUMP BATTERY CHANGE;  Surgeon: Milinda Pointer, MD;  Location: [facility] ORS;  Service: Neurosurgery;  Laterality: N/A;  . PORTA CATH INSERTION N/A 12/04/2016   Procedure: Glori Luis Cath Insertion;  Surgeon: Algernon Huxley, MD;  Location: [place] CV LAB;  Service: Cardiovascular;  Laterality: N/A;  . stem cell implant  2008   [facility]    Family History  Problem Relation Age of Onset  . Cancer Father        throat  . Kidney disease Sister   . Stroke Other   . Stroke Mother   . Bladder Cancer Neg Hx   . Prostate cancer Neg Hx   . Kidney cancer Neg Hx     Social History:  reports that he quit smoking about 45 years ago. His smoking use included cigarettes. He has a 30.00 pack-year smoking history. He has never used smokeless tobacco. He reports that he does not drink alcohol and does not use drugs. He has a wire haired dachshund. He lives in [place]. His wife's name is [name]. The patient is alone today.  Allergies:  Allergies  Allergen Reactions  . Azithromycin Diarrhea and Other (See Comments)    Possible cause of C. Diff Possible cause of C. Diff Possible cause of C. Diff Possible cause of C. Diff  . Bee Pollen Other (See Comments)    Sneezing, watery eyes, runny nose  . Pollen Extract Other (See Comments)    Sneezing, watery eyes, runny nose  . 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 Rivaroxaban Rash     Current Medications: Current Outpatient Medications  Medication Sig Dispense Refill  . acetaminophen (TYLENOL) 500 MG tablet Take 500 mg by mouth 2 (two) times a day.    . baclofen (LIORESAL) 10 MG tablet Take 1 tablet (10 mg total) by mouth 2 (two) times daily. 180 tablet 0  . bismuth subsalicylate (PEPTO BISMOL) 262 MG/15ML suspension Take 30 mLs by mouth every 6 (six) hours as needed for diarrhea or loose stools. (Patient not taking: No sig reported)    . Calcium Carb-Cholecalciferol (CALCIUM-VITAMIN D) 500-400 MG-UNIT TABS Take 1 tablet daily by mouth. (Patient not taking: No sig reported)    . calcium citrate-vitamin D (CITRACAL+D) 315-200 MG-UNIT tablet Take 1 tablet by mouth 2 (two) times daily.    [name]  cetirizine (ZYRTEC) 10 MG tablet Take 10 mg by mouth daily as needed for allergies.     . Cholecalciferol (VITAMIN D3) 2000 units capsule Take 2,000 Units daily by mouth.     . Daratumumab (DARZALEX IV) Inject 1,200 mg every 30 (thirty) days into the vein.     [name] dexamethasone (DECADRON) 4 MG tablet TAKE 4 TABLETS BY MOUTH 1 HOUR PRIOR TO INFUSION, THEN 1 TABLET ON DAYS 2 AND 3 AS DIRECTED (Patient not taking: Reported on 11/02/2020) 30 tablet 0  . diltiazem (CARDIZEM CD) 120 MG 24 hr capsule Take 120 mg daily by mouth.     . diltiazem (CARDIZEM) 60 MG tablet Take 60 mg daily as needed by mouth (for increased heart rate > 140).  (Patient not taking: No sig reported)    . diphenhydrAMINE (BENADRYL) 25 MG tablet Take 25 mg by mouth as directed. With chemo treatment    . ELIQUIS 5 MG TABS tablet TAKE 1 TABLET TWICE A DAY 180 tablet 1  . fluticasone (FLONASE) 50 MCG/ACT nasal spray Place 1 spray into the nose daily as needed for allergies.  (Patient not taking: No sig reported)    . Hypromellose (ARTIFICIAL TEARS OP) Place 1 drop as needed into both eyes (for dry eyes).    [name] loperamide (IMODIUM) 2 MG capsule Take 4 mg as needed by mouth for diarrhea or loose stools.     [name] loratadine (CLARITIN)  10 MG tablet Take 10 mg by mouth daily as needed.     . lovastatin (MEVACOR) 20 MG tablet Take 20 mg by mouth every evening.     . montelukast (SINGULAIR) 10 MG tablet Take 1 tablet by mouth on the day before, day of, and 2 days after treatment. 30 tablet 1  . Multiple Vitamins-Iron (MULTIVITAMIN/IRON PO) Take 1 tablet by mouth daily.     . naloxone (NARCAN) 2 MG/2ML injection Inject 1 mL (1 mg total) into the muscle as needed for up to 2 doses (for opioid overdose). In case of emergency (overdose), inject into muscle of upper arm or leg and call 911. 2 mL 1  . NON FORMULARY IT pump  Fentanyl 1,500.0 mcg/ml Bupivicaine 30.0 mg/ml Clonidine 300.0 mcg/ml Rate 500.4 mg/day    . omeprazole (PRILOSEC) 20 MG capsule Take 20 mg by mouth daily.     . ondansetron (ZOFRAN) 8 MG tablet Take 1 tablet (8 mg total) by mouth as directed. Take with chemo and can take it as needed 20 tablet 1  . potassium chloride SA (KLOR-CON) 20 MEQ tablet TAKE 1 TABLET(20 MEQ) BY MOUTH DAILY (Patient not taking: No sig reported) 90 tablet 0  . tamsulosin (FLOMAX) 0.4 MG CAPS capsule TAKE 1 CAPSULE BY MOUTH DAILY 90 capsule 2  . valACYclovir (VALTREX) 500 MG tablet TAKE 1 TABLET BY MOUTH EVERY DAY 30 tablet 3  . vitamin B-12 (CYANOCOBALAMIN) 1000 MCG tablet Take 1,000 mcg daily by mouth.      No current facility-administered medications for this visit.   Facility-Administered Medications Ordered in Other Visits  Medication Dose Route Frequency Provider Last Rate Last Admin  . heparin lock flush 100 unit/mL  500 Units Intravenous Once Lequita Asal, MD        Review of Systems  Constitutional: Negative for chills, diaphoresis, fever, malaise/fatigue and weight loss (up 2 lbs).  HENT: Positive for hearing loss. Negative for congestion, ear discharge, ear pain, nosebleeds, sinus pain, sore throat and tinnitus.   Eyes: Negative.  Negative for  blurred vision, double vision, photophobia and pain.  Respiratory: Positive  for shortness of breath. Negative for cough, hemoptysis, sputum production and wheezing.   Cardiovascular: Negative for chest pain, palpitations, orthopnea, leg swelling and PND.       H/o atrial fibrillation.  Gastrointestinal: Positive for abdominal pain (discomfort, queasiness) and diarrhea. Negative for blood in stool, constipation, heartburn, melena, nausea and vomiting.  Genitourinary: Negative.  Negative for dysuria, frequency, hematuria and urgency.  Musculoskeletal: Positive for back pain (chronic; Fentanyl/bupivacaine/clonidine pump). Negative for falls, joint pain, myalgias and neck pain.       Right side pain  Skin: Negative.  Negative for itching and rash.  Neurological: Positive for dizziness (lightheadedness) and sensory change (neuropathy in feet, on baclofen). Negative for tingling, tremors, speech change, focal weakness, seizures, weakness and headaches.  Endo/Heme/Allergies: Negative.  Negative for environmental allergies. Does not bruise/bleed easily.  Psychiatric/Behavioral: Negative.  Negative for depression and memory loss. The patient is not nervous/anxious and does not have insomnia.   All other systems reviewed and are negative.  Performance status (ECOG): 1  Vitals Blood pressure 95/63, pulse 75, temperature (!) 97 F (36.1 C), temperature source Tympanic, weight 163 lb (73.9 kg).   Physical Exam Vitals and nursing note reviewed.  Constitutional:      General: He is not in acute distress.    Appearance: He is well-developed. He is not diaphoretic.  HENT:     Head: Normocephalic and atraumatic.     Comments: Short brown hair.    Mouth/Throat:     Mouth: Mucous membranes are moist.     Pharynx: Oropharynx is clear. No oropharyngeal exudate.     Comments: Broken bottom left tooth. Eyes:     General: No scleral icterus.    Extraocular Movements: Extraocular movements intact.     Conjunctiva/sclera: Conjunctivae normal.     Pupils: Pupils are equal, round, and  reactive to light.     Comments: Glasses. Blue eyes.  Neck:     Vascular: No JVD.  Cardiovascular:     Rate and Rhythm: Normal rate and regular rhythm.     Heart sounds: Normal heart sounds. No murmur heard. No gallop.   Pulmonary:     Effort: Pulmonary effort is normal. No respiratory distress.     Breath sounds: Normal breath sounds. No wheezing or rales.  Chest:  Breasts:     Right: No axillary adenopathy or supraclavicular adenopathy.     Left: No axillary adenopathy or supraclavicular adenopathy.    Abdominal:     General: Bowel sounds are normal. There is no distension.     Palpations: Abdomen is soft. There is no hepatomegaly, splenomegaly or mass.     Tenderness: There is no abdominal tenderness. There is no right CVA tenderness, left CVA tenderness, guarding or rebound.  Musculoskeletal:        General: No tenderness. Normal range of motion.     Cervical back: Normal range of motion and neck supple.  Lymphadenopathy:     Head:     Right side of head: No preauricular, posterior auricular or occipital adenopathy.     Left side of head: No preauricular, posterior auricular or occipital adenopathy.     Cervical: No cervical adenopathy.     Upper Body:     Right upper body: No supraclavicular or axillary adenopathy.     Left upper body: No supraclavicular or axillary adenopathy.     Lower Body: No right inguinal adenopathy. No left inguinal adenopathy.  Skin:    General: Skin is warm and dry.     Coloration: Skin is not pale.     Findings: No erythema or rash.  Neurological:     Mental Status: He is alert and oriented to person, place, and time.  Psychiatric:        Behavior: Behavior normal.        Thought Content: Thought content normal.        Judgment: Judgment normal.    Infusion on 12/13/2020  Component Date Value Ref Range Status  . Sodium 12/13/2020 138  135 - 145 mmol/L Final  . Potassium 12/13/2020 3.7  3.5 - 5.1 mmol/L Final  . Chloride 12/13/2020 103   98 - 111 mmol/L Final  . CO2 12/13/2020 23  22 - 32 mmol/L Final  . Glucose, Bld 12/13/2020 178* 70 - 99 mg/dL Final   Glucose reference range applies only to samples taken after fasting for at least 8 hours.  . BUN 12/13/2020 17  8 - 23 mg/dL Final  . Creatinine, Ser 12/13/2020 1.33* 0.61 - 1.24 mg/dL Final  . Calcium 12/13/2020 9.3  8.9 - 10.3 mg/dL Final  . Total Protein 12/13/2020 6.4* 6.5 - 8.1 g/dL Final  . Albumin 12/13/2020 4.1  3.5 - 5.0 g/dL Final  . AST 12/13/2020 21  15 - 41 U/L Final  . ALT 12/13/2020 20  0 - 44 U/L Final  . Alkaline Phosphatase 12/13/2020 45  38 - 126 U/L Final  . Total Bilirubin 12/13/2020 0.8  0.3 - 1.2 mg/dL Final  . GFR, Estimated 12/13/2020 56* >60 mL/min Final   Comment: (NOTE) Calculated using the CKD-EPI Creatinine Equation (2021)   . Anion gap 12/13/2020 12  5 - 15 Final   Performed at [facility], [address], [place], [place] [address]  . WBC 12/13/2020 9.3  4.0 - 10.5 K/uL Final  . RBC 12/13/2020 3.77* 4.22 - 5.81 MIL/uL Final  . Hemoglobin 12/13/2020 12.9* 13.0 - 17.0 g/dL Final  . HCT 12/13/2020 37.3* 39.0 - 52.0 % Final  . MCV 12/13/2020 98.9  80.0 - 100.0 fL Final  . MCH 12/13/2020 34.2* 26.0 - 34.0 pg Final  . MCHC 12/13/2020 34.6  30.0 - 36.0 g/dL Final  . RDW 12/13/2020 13.0  11.5 - 15.5 % Final  . Platelets 12/13/2020 241  150 - 400 K/uL Final  . nRBC 12/13/2020 0.0  0.0 - 0.2 % Final  . Neutrophils Relative % 12/13/2020 67  % Final  . Neutro Abs 12/13/2020 6.3  1.7 - 7.7 K/uL Final  . Lymphocytes Relative 12/13/2020 23  % Final  . Lymphs Abs 12/13/2020 2.1  0.7 - 4.0 K/uL Final  . Monocytes Relative 12/13/2020 8  % Final  . Monocytes Absolute 12/13/2020 0.7  0.1 - 1.0 K/uL Final  . Eosinophils Relative 12/13/2020 1  % Final  . Eosinophils Absolute 12/13/2020 0.1  0.0 - 0.5 K/uL Final  . Basophils Relative 12/13/2020 1  % Final  . Basophils Absolute 12/13/2020 0.1  0.0 - 0.1 K/uL Final  . Immature Granulocytes  12/13/2020 0  % Final  . Abs Immature Granulocytes 12/13/2020 0.04  0.00 - 0.07 K/uL Final   Performed at [facility], [address] [place], [place] [address]  Appointment on 12/13/2020  Component Date Value Ref Range Status  . C Diff antigen 12/13/2020 NEGATIVE  NEGATIVE Final  . C Diff toxin 12/13/2020 NEGATIVE  NEGATIVE Final  . C Diff interpretation  12/13/2020 No C. difficile detected.   Final   Performed at [facility], [address] [place], [place] [address]    Assessment:  Logan Perez is a 75 y.o. male with stage III mutiple myeloma. He initially presented with progressive back pain beginning in 12/2006. MRI revealed "spots and compression fractures". He began Velcade, thalidomide, and Decadron. In 08/2007, he underwent high dose chemotherapy and autologous stem cell transplant. 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  He recurred with a rising M-spike (2.7) with repeat M spike (1.7 gm/dl) in 03/2010. He was initially treated with Velcade (02/08/2010 - 05/10/2010). He then began Revlimid (15 mg 3 weeks on/1 week off) and Decadron (40 mg on day 1, 8, 15, 22). Because of significant side effect with Decadron his dose was decreased to 10 mg once a week in 07/2010.   He was on maintenance Revlimid. Revlimid was initially10 mg 3 weeks on/1 week off. This was changed to 10 mg 2 weeks on/2 weeks off secondary to right nipple tenderness. His dose was increased to 10 mg 3 weeks on/1 week off with Decadron 10 mg a week (on Sundays) and then Revlamid 15 mg 3 weeks on and 1 week off with Decadron on Sundays. He began Pomalyst4 mg 3 weeks on/1 week off with Decadron on 08/27/2015.  SPEPrevealed no monoclonal protein (04/21/2015) and 0.5 gm/dL on 09/22/2015 and 10/20/2015. M spikewas 0.1 on 02/02/2016, 03/01/2016, 03/29/2016, 04/26/2016, and 05/24/2016. M spikewas 0 on 10/11/2016, 11/28/2016, 02/05/2017, 03/21/2017,  08/02/2017, 10/04/2017, 12/27/2017, 01/24/2018, and 02/21/2018. M spikewas 0.1 on 11/01/2017, 0.1 on 11/29/2017, 0 on 12/27/2017, 0 on 01/24/2018, 0 on 02/21/2018, 0.1 on 03/21/2018, 0.3 on 04/18/2018, 0.1 on 05/16/2018, 0 on 06/13/2018, 0.2 on 08/08/2018, 0 on 09/09/2018, 0.1 on 11/04/2018, 0 on 12/02/2018, 0.1 on 12/30/2018, 0 on 01/27/2019, 0.1 on 02/24/2019, 0.1 on 03/24/2019, 0.1 on 04/23/2019, 0 on 05/19/2019, 0.1 on 06/17/2019, 0.2 on 07/15/2019, 0.2 on 08/18/2019, 0.2 on 09/15/2019, 0.1 on 10/13/2019, 0.1 on 11/13/2019, 0 on 12/11/2019, 0.3 on 01/08/2020, 0.2 on 02/05/2020, 0.2 on 03/04/2020, 0.2 on 04/01/2020, 0 on 05/05/2020, 0.4 on 06/07/2020, 0.4 on 07/06/2020, 0.3 on 08/03/2020, 0.1 on 08/31/2020, 0.3 on 09/28/2020, and 0.2 on 10/26/2020.  Free light chainshave been monitored. Kappa free light chainswere 18.54 on 11/21/2013, 18.37 on 02/20/2014, 18.93 on 05/22/2014, 32.58 (high; normal ratio 1.27) on 08/21/2014, 51.53 (high; elevated ratio of 2.12) on 11/13/2014, 28.08 (ratio 1.73) on 12/09/2014, 23.71 (ratio 2.17) on 01/18/2015, 92.93 (ratio 9.49) on 04/21/2015, 93.44 (ratio 10.28) on 05/26/2015, 255.45 (ratio of 24.05) on 07/14/2015, 373.89 (ratio 48.31) on 08/04/2015, 474.33 (ratio 70.58) on 08/25/2015, 450.76 (ratio 55.44) on 09/22/2015, 453.4 (ratio 59.89) on 10/20/2015, 58.26 (ratio of >40.74) on 02/02/2016, 62.67 (ratio of 37.98) on 03/01/2016, 75.7 (ratio >50.47) on 03/29/2016, 79.7 (ratio 53.13) on 04/26/2016, 108.6 (ratio >72.4) on 05/24/2016, 8.3 (1.46 ratio) on 10/11/2016, 6.7 (0.81 ratio) on 02/05/2017, 7.8 (1.01 ratio) on 03/21/2017,7.6 (ratio 0.63) on 06/01/2017, 8.1 (ratio 1.13) on 11/29/2017, 9.6 (ratio 1.55) on 06/13/2018, 6.6 (ratio 2.06) on 07/11/2018, 6.9 (ratio 0.82) on 08/08/2018, 5.8 (ratio 1.66) on 09/09/2018, 5.8 (ratio 1.05) on 11/04/2018, 5.6(ratio 2.55)on 12/02/2018, 5.5 (ratio 1.96) on 01/27/2019, 7.6 (ratio 2.38) on 06/17/2019, 6.9 (ratio 1.21) on 11/13/2019,  6.6 (ratio 2.0) on 12/11/2019, 7.9 (ratio 2.14) on 02/05/2020, 6.6 (ratio 2.28) on 04/01/2020, 7.0 (ratio 2.12) on 05/05/2020, 9.3 (ratio 2.16) on 07/06/2020,  7.1 (ratio 2.09) on 08/31/2020, and 6.9 (ratio 2.09) on 10/26/2020.  Bone surveyon 12/08/2014 was stable.  Bone survey on 10/21/2015 revealed increase conspicuity of subcentimeter lytic lesions in the calvarium.  PET scan on 08/14/2018 revealed no evidence of active myeloma.  Bone survey on 11/04/2019 revealed stable small lytic lesions throughout the skull.  There were no other lytic lesions.  Bone marrow aspirate and biopsyon 11/04/2015 revealed an atypical monoclonal plasma cells estimated at 30-40% of marrow cells. Marrow was variably cellular (approximately 45%) with background trilineage hematopoiesis. There was no significant increase in marrow reticulin fibers. Storage iron was present.   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 He develoed herpes zoster in 04/2008. He developed a pulmonary embolismin 05/2013. He was initially on Xarelto, but is now on Eliquis. He had an episode of pneumonia around this time requiring a brief admission. He developed severe lower leg cramps on 08/07/2014 secondary to hypokalemia. Duplex was negative.   He was treated forC difficile colitis(Flagyl completed 07/30/2015). He has a chronic indwelling pain pump.  He received 4 cycles of Pomalyst and Decadron(08/27/2015 - 11/19/2015). Restaging studies document progressive disease. Kappa free light chains are increasing. SPEP revealed 0.5 gm/dL monoclonal protein then 1.3 gm/dL. Bone survey reveals increase conspicuity of subcentimeter lytic lesions in the calvarium. Bone marrow reveals 30-40% plasma cells.   MUGAon 11/30/2015 revealed an ejection fraction of 46%. He is felt not to be a good candidate for Kyprolis. There were no focal wall motion abnormalities. He had a stress  echo less than 1 year ago. He has a history of PVCs and atrial fibrillation. He takes Cardizem prn.  He received 17 weeks of daratumumab(Darzalex) (12/09/2015 - 05/25/2016). He tolerated treatment well without side effect.  Bone marrow on 02/09/2016 revealed no diagnostic morphologic evidence of plasma cell myeloma. Marrow was normocellular to hypocellular marrow for age (ranging from 10-40%) with maturing trilineage hematopoiesis and mild multilineage dyspoiesis. There was patchy mild increase in reticulin. Storage iron was present. Flow cytometry revealed no definitive evidence of monoclonality. There was a non-specific atypical myeloid and monocytic findings with no increase in blasts. Cytogenetics were normal (46, XY).  He is currently42month s/p 2nd autologous stem cell transplanton 06/16/2016. 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 He also experienced atrial fibrillation with intermittent episodes of RVR requiring IV beta blockers. He is on prophylactic valacyclovir for 1 year post transplant.  He started vaccinations(DTaP-Pediatric triple vaccine, Hep B- Pediatrix triple vaccine, Haemophilus influenza B (Hib), inactivated polio virus (IPV), pneumococcal conjugate vaccine 13-valent (PCV 13)) on 04/17/2017. Recommendation was for Sh[place]rix vaccine [place] dose of [place]rix in 2 months. He had follow-up vaccinations on 08/28/2017. He had his second shingles vaccine. He received 18 month vaccinations- DTaP-Pediatric triple vaccine, Hep B- Pediatrix triple vaccine, Haemophilus influenza B (Hib), inactivated polio virus (IPV), pneumococcal conjugate vaccine 13-valent (PCV 13) on 02/05/2018.  He is s/p50cycles ofdaratumumab(Darzalex) post transplant(12/05/2016 -10/26/2020).  PET scanon 08/14/2018 revealed no evidence of active myeloma on whole-body FDG PET scan. There was no evidence soft  tissue plasmacytoma. There was no clear evidence of lytic lesions on the CT portion exam. Some lucencies in the pelvis were stable. There were chronic compression fractures in the lower thoracic spine.  Bone survey on 11/04/2019 revealed stable small lytic lesions noted throughout the skull.  There were no other lytic lesions. Exam was stable.   He has a history of hypomagnesemiathat required IV magnesium (2-4 gm) weekly. He has not required magnesium since 10/24/2016. Patient has  atrial fibrillationand is on Eliquis.  Symptomatically, he has had frequent bowel movements poorly controlled.  Exam reveals dehydration.   Plan: 1.   Labs today: GI panel and C. Diff. 2.Multiple myeloma Clinically, he appears to be doing fairly well M spike fluctuates between 0.1 - 0.4 gm/dL in the past year without trend. M-spike was 0.2 gm/dL on 10/26/2020. PET scan on 08/14/2018 revealed no evidence of active myeloma. Bone survey on 11/10/2020 revealed no focal lytic lesions.  There were stable compression fractures of T5, T10 and T12. He is s/p 50 cycles of daratumumab today.   Continue visits every other month unless aggressive trend in M spike. Continue prophylactic valacyclovir (until 3 months s/pdaratumumab). He is no longer followed in the transplant center. Discuss symptom management.  He has antiemetics at home to use on a prn bases.  Interventions are adequate.          3. Normocytic anemia Hematocrit37.3. Hemoglobin12.9. MCV98.9. B12 and folate were normal on 12/11/2019.  TSH was 4.542 (slightly elevated) with a normal free T4 on 12/11/2019. Check B12 and folate annually. 4.Neuropathy Neuropathy is stable. He remains on baclofen. 5.Acute on chronic diarrhea Symptomatically, his stools have increased. Daratumumab side effects: 16% diarrhea Etiology unclear. Send stool for C. difficile and GI panel by PCR. 6.History of pulmonary  embolism(2014) Continue Eliquis.  He denies any bruising or bleeding. 7.   Dehydration  IVF 500 cc/hr x 2 hours.  Repeat vitals after fluid. 8.   Stool for:  C diff and GI panel by PCR. 9.   RTC tomorrow for IVF.  Addendum: Stool was negative for C. difficile but positive for enteropathic E. coli (EPEC) as well as adenovirus.  I discussed the assessment and treatment plan with the patient.  The patient was provided an opportunity to ask questions and all were answered.  The patient agreed with the plan and demonstrated an understanding of the instructions.  The patient was advised to call back if the symptoms worsen or if the condition fails to improve as anticipated.  I provided 19 minutes of face-to-face time during this this encounter and > 50% was spent counseling as documented under my assessment and plan.  An additional 12 minutes were spent reviewing his chart (Epic and Care Everywhere) including notes, labs, and imaging studies.    Lequita Asal, MD, PhD    12/13/2020, 4:16 PM  I, Mirian Mo Tufford, am acting as Education administrator for [employer]. Mike Gip, MD, PhD.  I, Azarah Dacy C. Mike Gip, MD, have reviewed the above documentation for accuracy and completeness, and I agree with the above.

## 2020-12-14 ENCOUNTER — Inpatient Hospital Stay: Payer: Medicare Other

## 2020-12-14 ENCOUNTER — Telehealth: Payer: Self-pay | Admitting: Hematology and Oncology

## 2020-12-14 ENCOUNTER — Telehealth: Payer: Self-pay | Admitting: *Deleted

## 2020-12-14 ENCOUNTER — Other Ambulatory Visit: Payer: Self-pay | Admitting: Hematology and Oncology

## 2020-12-14 DIAGNOSIS — Z5112 Encounter for antineoplastic immunotherapy: Secondary | ICD-10-CM | POA: Diagnosis not present

## 2020-12-14 DIAGNOSIS — R110 Nausea: Secondary | ICD-10-CM

## 2020-12-14 LAB — GASTROINTESTINAL PANEL BY PCR, STOOL (REPLACES STOOL CULTURE)

## 2020-12-14 MED ORDER — SODIUM CHLORIDE 0.9% FLUSH
10.0000 mL | Freq: Once | INTRAVENOUS | Status: AC | PRN
  Administered 2020-12-14: 10 mL
  Filled 2020-12-14: qty 10

## 2020-12-14 MED ORDER — HEPARIN SOD (PORK) LOCK FLUSH 100 UNIT/ML IV SOLN
INTRAVENOUS | Status: AC
  Filled 2020-12-14: qty 5

## 2020-12-14 MED ORDER — SODIUM CHLORIDE 0.9 % IV SOLN
Freq: Once | INTRAVENOUS | Status: AC
  Filled 2020-12-14: qty 250

## 2020-12-14 MED ORDER — ONDANSETRON HCL 4 MG/2ML IJ SOLN
8.0000 mg | Freq: Once | INTRAMUSCULAR | Status: AC
  Administered 2020-12-14: 8 mg via INTRAVENOUS

## 2020-12-14 MED ORDER — CIPROFLOXACIN HCL 500 MG PO TABS: 500.0000 mg | ORAL_TABLET | Freq: Two times a day (BID) | ORAL | 0 refills | Status: AC

## 2020-12-14 MED ORDER — HEPARIN SOD (PORK) LOCK FLUSH 100 UNIT/ML IV SOLN
500.0000 [IU] | Freq: Once | INTRAVENOUS | Status: AC | PRN
Start: 2020-12-14 — End: 2020-12-14
  Administered 2020-12-14: 500 [IU]
  Filled 2020-12-14: qty 5

## 2020-12-14 MED ORDER — ONDANSETRON HCL 4 MG/2ML IJ SOLN
INTRAMUSCULAR | Status: AC
  Filled 2020-12-14: qty 4

## 2020-12-14 NOTE — Telephone Encounter (Signed)
Re:  GI panel by PCR  I called the patient today to discuss his stool studies from yesterday.  I spoke with his wife.  C. difficile is negative.  GI panel by PCR is again positive for enteropathogenic E. coli (EPEC).  I discussed the initiation of antibiotics.  Patient previously noted that he could not take azithromycin.  This was confirmed by his wife.  We discussed initiation of ciprofloxacin 500 mg p.o. p.o. twice daily x3 days (up to 5 days).  This was previously discussed with infectious disease.  He is coming back today for IV fluids.  I encouraged the patient's wife to contact the clinic if she has any concerns.  Several questions were asked and answered.   Lequita Asal, MD

## 2020-12-14 NOTE — Telephone Encounter (Signed)
Called report Stool positive for EPEC   Notes  Component Ref Range & Units 1 d ago 2 wk ago 3 yr ago  Campylobacter species NOT DETECTED NOT DETECTED  NOT DETECTED  NOT DETECTED   Plesimonas shigelloides NOT DETECTED NOT DETECTED  NOT DETECTED  NOT DETECTED   Salmonella species NOT DETECTED NOT DETECTED  NOT DETECTED  NOT DETECTED   Yersinia enterocolitica NOT DETECTED NOT DETECTED  NOT DETECTED  NOT DETECTED   Vibrio species NOT DETECTED NOT DETECTED  NOT DETECTED  NOT DETECTED   Vibrio cholerae NOT DETECTED NOT DETECTED  NOT DETECTED  NOT DETECTED   Enteroaggregative E coli (EAEC) NOT DETECTED NOT DETECTED  NOT DETECTED  NOT DETECTED   Enteropathogenic E coli (EPEC) NOT DETECTED DETECTEDAbnormal  DETECTEDAbnormal CM  NOT DETECTED   Comment: RESULT CALLED TO, READ BACK BY AND VERIFIED WITH:  Maddie Brazier AT 1323 12/14/20 SDR   Enterotoxigenic E coli (ETEC) NOT DETECTED NOT DETECTED  NOT DETECTED  NOT DETECTED   Shiga like toxin producing E coli (STEC) NOT DETECTED NOT DETECTED  NOT DETECTED  NOT DETECTED   Shigella/Enteroinvasive E coli (EIEC) NOT DETECTED NOT DETECTED  NOT DETECTED  NOT DETECTED   Cryptosporidium NOT DETECTED NOT DETECTED  NOT DETECTED  NOT DETECTED   Cyclospora cayetanensis NOT DETECTED NOT DETECTED  NOT DETECTED  NOT DETECTED   Entamoeba histolytica NOT DETECTED NOT DETECTED  NOT DETECTED  NOT DETECTED   Giardia lamblia NOT DETECTED NOT DETECTED  NOT DETECTED  NOT DETECTED   Adenovirus F40/41 NOT DETECTED NOT DETECTED  DETECTEDAbnormal CM  NOT DETECTED   Astrovirus NOT DETECTED NOT DETECTED  NOT DETECTED  NOT DETECTED   Norovirus GI/GII NOT DETECTED NOT DETECTED  NOT DETECTED  NOT DETECTED   Rotavirus A NOT DETECTED NOT DETECTED  NOT DETECTED  NOT DETECTED   Sapovirus (I, II, IV, and V) NOT DETECTED NOT DETECTED  NOT DETECTED CM  NOT DETECTED   Comment: Performed at [facility], [address] [place], [place] [address]  Resulting Agency  [facility] CL[facility]  LAB [facility] CLIN LAB [facility] CLIN LAB         Specimen Collected: 12/13/20 15:29 Last Resulted: 12/14/20 13:32

## 2020-12-14 NOTE — Progress Notes (Signed)
Patient received iv fluids and zofran in clinic. Tolerated well. Patient stable at discharge.

## 2020-12-15 ENCOUNTER — Inpatient Hospital Stay: Payer: Medicare Other

## 2020-12-15 ENCOUNTER — Other Ambulatory Visit: Payer: Self-pay

## 2020-12-15 DIAGNOSIS — R110 Nausea: Secondary | ICD-10-CM

## 2020-12-15 DIAGNOSIS — Z5112 Encounter for antineoplastic immunotherapy: Secondary | ICD-10-CM | POA: Diagnosis not present

## 2020-12-15 LAB — BASIC METABOLIC PANEL
Anion gap: 13 (ref 5–15)
BUN: 15 mg/dL (ref 8–23)
CO2: 22 mmol/L (ref 22–32)
Calcium: 9 mg/dL (ref 8.9–10.3)
Chloride: 104 mmol/L (ref 98–111)
Creatinine, Ser: 1.27 mg/dL — ABNORMAL HIGH (ref 0.61–1.24)
GFR, Estimated: 59 mL/min — ABNORMAL LOW (ref >60)
Glucose, Bld: 130 mg/dL — ABNORMAL HIGH (ref 70–99)
Potassium: 3.7 mmol/L (ref 3.5–5.1)
Sodium: 139 mmol/L (ref 135–145)

## 2020-12-15 MED ORDER — HEPARIN SOD (PORK) LOCK FLUSH 100 UNIT/ML IV SOLN
INTRAVENOUS | Status: AC
  Filled 2020-12-15: qty 5

## 2020-12-15 MED ORDER — HEPARIN SOD (PORK) LOCK FLUSH 100 UNIT/ML IV SOLN
250.0000 [IU] | Freq: Once | INTRAVENOUS | Status: DC | PRN
  Filled 2020-12-15: qty 5

## 2020-12-15 MED ORDER — SODIUM CHLORIDE 0.9% FLUSH
3.0000 mL | Freq: Once | INTRAVENOUS | Status: DC | PRN
  Filled 2020-12-15: qty 3

## 2020-12-15 MED ORDER — SODIUM CHLORIDE 0.9% FLUSH
10.0000 mL | Freq: Once | INTRAVENOUS | Status: DC | PRN
  Filled 2020-12-15: qty 10

## 2020-12-15 MED ORDER — HEPARIN SOD (PORK) LOCK FLUSH 100 UNIT/ML IV SOLN
500.0000 [IU] | Freq: Once | INTRAVENOUS | Status: AC | PRN
  Administered 2020-12-15: 500 [IU]
  Filled 2020-12-15: qty 5

## 2020-12-15 MED ORDER — MAGNESIUM SULFATE 2 GM/50ML IV SOLN: 2.0000 g | Freq: Once | INTRAVENOUS | Status: DC

## 2020-12-15 MED ORDER — SODIUM CHLORIDE 0.9 % IV SOLN
Freq: Once | INTRAVENOUS | Status: AC
  Filled 2020-12-15: qty 250

## 2020-12-15 MED ORDER — ALTEPLASE 2 MG IJ SOLR
2.0000 mg | Freq: Once | INTRAMUSCULAR | Status: DC | PRN
  Filled 2020-12-15: qty 2

## 2020-12-16 ENCOUNTER — Inpatient Hospital Stay: Payer: Medicare Other

## 2020-12-16 DIAGNOSIS — Z5112 Encounter for antineoplastic immunotherapy: Secondary | ICD-10-CM | POA: Diagnosis not present

## 2020-12-16 MED ORDER — SODIUM CHLORIDE 0.9 % IV SOLN
Freq: Once | INTRAVENOUS | Status: AC
  Filled 2020-12-16: qty 250

## 2020-12-16 MED ORDER — HEPARIN SOD (PORK) LOCK FLUSH 100 UNIT/ML IV SOLN
500.0000 [IU] | Freq: Once | INTRAVENOUS | Status: AC | PRN
  Administered 2020-12-16: 500 [IU]
  Filled 2020-12-16: qty 5

## 2020-12-16 MED ORDER — SODIUM CHLORIDE 0.9% FLUSH
10.0000 mL | Freq: Once | INTRAVENOUS | Status: AC | PRN
  Administered 2020-12-16: 10 mL
  Filled 2020-12-16: qty 10

## 2020-12-16 NOTE — Progress Notes (Signed)
Patient received prescribed treatment in clinic. Tolerated well. Patient stable at discharge. 

## 2020-12-18 ENCOUNTER — Other Ambulatory Visit: Payer: Self-pay | Admitting: Oncology

## 2020-12-18 MED ORDER — CIPROFLOXACIN HCL 500 MG PO TABS: 500.0000 mg | ORAL_TABLET | Freq: Two times a day (BID) | ORAL | 0 refills | Status: DC

## 2020-12-20 ENCOUNTER — Telehealth: Payer: Self-pay

## 2020-12-22 ENCOUNTER — Inpatient Hospital Stay: Payer: Medicare Other

## 2020-12-22 ENCOUNTER — Other Ambulatory Visit: Payer: Self-pay

## 2020-12-22 ENCOUNTER — Ambulatory Visit
Admission: RE | Admit: 2020-12-22 | Discharge: 2020-12-22 | Disposition: A | Discharge status: Home / Self Care | Payer: Medicare Other | Attending: Oncology | Admitting: Oncology

## 2020-12-22 ENCOUNTER — Ambulatory Visit
Admission: RE | Admit: 2020-12-22 | Discharge: 2020-12-22 | Disposition: A | Discharge status: Home / Self Care | Payer: Medicare Other | Source: Ambulatory Visit | Attending: Oncology | Admitting: Oncology

## 2020-12-22 ENCOUNTER — Telehealth: Payer: Self-pay | Admitting: *Deleted

## 2020-12-22 ENCOUNTER — Other Ambulatory Visit: Payer: Self-pay | Admitting: Oncology

## 2020-12-22 ENCOUNTER — Inpatient Hospital Stay (HOSPITAL_BASED_OUTPATIENT_CLINIC_OR_DEPARTMENT_OTHER): Payer: Medicare Other | Admitting: Oncology

## 2020-12-22 VITALS — BP 145/90 | HR 83 | Temp 97.8°F | Resp 17 | Wt 165.1 lb

## 2020-12-22 DIAGNOSIS — R197 Diarrhea, unspecified: Secondary | ICD-10-CM | POA: Diagnosis not present

## 2020-12-22 DIAGNOSIS — Z5112 Encounter for antineoplastic immunotherapy: Secondary | ICD-10-CM | POA: Diagnosis not present

## 2020-12-22 DIAGNOSIS — R0781 Pleurodynia: Secondary | ICD-10-CM

## 2020-12-22 LAB — GASTROINTESTINAL PANEL BY PCR, STOOL (REPLACES STOOL CULTURE)

## 2020-12-22 LAB — COMPREHENSIVE METABOLIC PANEL
ALT: 15 U/L (ref 0–44)
AST: 18 U/L (ref 15–41)
Albumin: 4 g/dL (ref 3.5–5.0)
Alkaline Phosphatase: 45 U/L (ref 38–126)
Anion gap: 10 (ref 5–15)
BUN: 15 mg/dL (ref 8–23)
CO2: 24 mmol/L (ref 22–32)
Calcium: 8.8 mg/dL — ABNORMAL LOW (ref 8.9–10.3)
Chloride: 105 mmol/L (ref 98–111)
Creatinine, Ser: 1.41 mg/dL — ABNORMAL HIGH (ref 0.61–1.24)
GFR, Estimated: 52 mL/min — ABNORMAL LOW (ref >60)
Glucose, Bld: 103 mg/dL — ABNORMAL HIGH (ref 70–99)
Potassium: 3.5 mmol/L (ref 3.5–5.1)
Sodium: 139 mmol/L (ref 135–145)
Total Bilirubin: 0.7 mg/dL (ref 0.3–1.2)
Total Protein: 6 g/dL — ABNORMAL LOW (ref 6.5–8.1)

## 2020-12-22 LAB — CBC WITH DIFFERENTIAL/PLATELET
Abs Immature Granulocytes: 0.02 10*3/uL (ref 0.00–0.07)
Basophils Absolute: 0 10*3/uL (ref 0.0–0.1)
Basophils Relative: 0 %
Eosinophils Absolute: 0.1 10*3/uL (ref 0.0–0.5)
Eosinophils Relative: 2 %
HCT: 34.6 % — ABNORMAL LOW (ref 39.0–52.0)
Hemoglobin: 12 g/dL — ABNORMAL LOW (ref 13.0–17.0)
Immature Granulocytes: 0 %
Lymphocytes Relative: 28 %
Lymphs Abs: 2.2 10*3/uL (ref 0.7–4.0)
MCH: 34.6 pg — ABNORMAL HIGH (ref 26.0–34.0)
MCHC: 34.7 g/dL (ref 30.0–36.0)
MCV: 99.7 fL (ref 80.0–100.0)
Monocytes Absolute: 0.7 10*3/uL (ref 0.1–1.0)
Monocytes Relative: 10 %
Neutro Abs: 4.7 10*3/uL (ref 1.7–7.7)
Neutrophils Relative %: 60 %
Platelets: 184 10*3/uL (ref 150–400)
RBC: 3.47 MIL/uL — ABNORMAL LOW (ref 4.22–5.81)
RDW: 13.4 % (ref 11.5–15.5)
WBC: 7.8 10*3/uL (ref 4.0–10.5)
nRBC: 0 % (ref 0.0–0.2)

## 2020-12-22 LAB — C DIFFICILE QUICK SCREEN W PCR REFLEX
C Diff antigen: NEGATIVE
C Diff interpretation: NOT DETECTED
C Diff toxin: NEGATIVE

## 2020-12-22 MED ORDER — SODIUM CHLORIDE 0.9 % IV SOLN
INTRAVENOUS | Status: DC
  Filled 2020-12-22: qty 250

## 2020-12-22 NOTE — Progress Notes (Signed)
Symptom Management Consult note [facility]  [phone] [phone]  Patient Care Team: Lequita Asal, MD as PCP - General (Hematology and Oncology) Corey Skains, MD as Consulting Physician (Internal Medicine) Crissie Sickles, MD as Referring Physician (Hematology and Oncology) Lucilla Lame, MD as Consulting Physician (Gastroenterology) Milinda Pointer, MD as Referring Physician (Pain Medicine) Milinda Pointer, MD as Referring Physician (Pain Medicine)   Name of the patient: Logan Perez  [id number]  [birth year]   Date of visit: 12/22/2020   Diagnosis- 1. Acute diarrhea - C difficile quick screen w PCR reflex; Future  Chief complaint/ Reason for visit- Diarrhea and Rib pain    Heme/Onc history: [name] is a 75 yo male with pmh significant for hypertension, MI, atrial fibrillation, GERD, lumbar spondylosis, BPH, chronic kidney disease who is being followed by Dr. Mike Gip for multiple myeloma status post autoLog a stem cell transplant and relapse.  He is currently on daratumumab.  He has chronic diarrhea and takes Imodium.  He does have history of C. difficile infection.  He also has chronic nausea.  He is taking Singulair and Decadron.  He has a pain pump that is managed by Dr. Dossie Arbour d/t compression fractures.   Recently had a bone survey on 11/10/2020 which showed stable compression fraction of T5, T10 and T12.  No focal lytic lesions identified.  M spike fluctuates between 0.13-0.4 g/dL in the last year.  Interval history-today, he presents to symptom management for concerns of left lateral rib pain and worsening chronic diarrhea.  He was teated (12/14/20)  for EPEC with Cipro daily x3 days with extension for 5 additional days due to persistent worsening diarrhea.  His last dose was yesterday morning.  Consistency of his stool is loose pudding-like.  He denies any distinct smell.  States he has had greater than 59  episodes of diarrhea in the month of March. He has not been taking his antidiarrheals due to viral etiology.  He has been drinking Gatorade and lots of water.  He had 5 bowel movements yesterday and one this morning.  Denies any fevers or abdominal symptoms including nausea or vomiting.  Historically has had diarrhea since he started daratumumab.   He also complains of right sided lateral rib pain.  Symptoms started this past Saturday after he bent down to pick up his dog.  Describes pain as shooting stabbing with certain movements otherwise denies pain.  States he feels like he has a rib fracture.  He denies any trouble breathing, shortness of breath or chest pain.   ECOG FS:1 - Symptomatic but completely ambulatory  Review of systems- Review of Systems  Constitutional: Negative.  Negative for chills, fever, malaise/fatigue and weight loss.  HENT: Negative for congestion, ear pain and tinnitus.   Eyes: Negative.  Negative for blurred vision and double vision.  Respiratory: Negative.  Negative for cough, sputum production and shortness of breath.   Cardiovascular: Negative.  Negative for chest pain, palpitations and leg swelling.  Gastrointestinal: Positive for diarrhea. Negative for abdominal pain, constipation, nausea and vomiting.  Genitourinary: Negative for dysuria, frequency and urgency.  Musculoskeletal: Negative for back pain and falls.       Rib pain  Skin: Negative.  Negative for rash.  Neurological: Negative.  Negative for weakness and headaches.  Endo/Heme/Allergies: Negative.  Does not bruise/bleed easily.  Psychiatric/Behavioral: Negative.  Negative for depression. The patient is not nervous/anxious and does not have insomnia.  Current treatment-daratumumab  Allergies  Allergen Reactions  . Azithromycin Diarrhea and Other (See Comments)    Possible cause of C. Diff Possible cause of C. Diff Possible cause of C. Diff Possible cause of C. Diff  . Bee Pollen Other (See  Comments)    Sneezing, watery eyes, runny nose  . Pollen Extract Other (See Comments)    Sneezing, watery eyes, runny nose  . 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 Rivaroxaban Rash     Past Medical History:  Diagnosis Date  . Anxiety   . Atrial fibrillation ([place])   . BPH (benign prostatic hyperplasia)   . Complication of anesthesia    BAD HEADACHE NIGHT OF FIRST CATARACT  . Compression fracture of lumbar vertebra ([place])   . Difficulty voiding   . Dysrhythmia    A FIB  . Elevated PSA   . GERD (gastroesophageal reflux disease)   . Hearing aid worn    bilateral  . History of kidney stones   . HLD (hyperlipidemia)   . HOH (hard of hearing)   . Hypertension   . Multiple myeloma ([place])   . Neuropathy    feet. 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  . Pain    BACK  . Palpitations   . Pneumonia   . Pulmonary embolism ([place])   . Sepsis ([place])   . Stroke [facility])    TIA, detected on CT scan. pt was unaware     Past Surgical History:  Procedure Laterality Date  . BACK SURGERY  1994  . CATARACT EXTRACTION W/PHACO Right 05/02/2016   Procedure: CATARACT EXTRACTION PHACO AND INTRAOCULAR LENS PLACEMENT (IOC);  Surgeon: Birder Robson, MD;  Location: [facility] ORS;  Service: Ophthalmology;  Laterality: Right;  [place] 1.06 AP% 20.6 CDE 13.70 FLUID PACK LOT # [id number]  . CATARACT EXTRACTION W/PHACO Left 05/16/2016   Procedure: CATARACT EXTRACTION PHACO AND INTRAOCULAR LENS PLACEMENT (IOC);  Surgeon: Birder Robson, MD;  Location: [facility] ORS;  Service: Ophthalmology;  Laterality: Left;  [place] 01:43 AP% 19.8 CDE 20.45 FLUID PACK LOT #[id number] H  . COLONOSCOPY WITH PROPOFOL N/A 03/22/2017   Procedure: COLONOSCOPY WITH PROPOFOL;  Surgeon: Lucilla Lame, MD;  Location: [facility];  Service: Endoscopy;  Laterality: N/A;  has port  . ESOPHAGOGASTRODUODENOSCOPY (EGD) WITH PROPOFOL  03/22/2017   Procedure: ESOPHAGOGASTRODUODENOSCOPY (EGD) WITH PROPOFOL;   Surgeon: Lucilla Lame, MD;  Location: [facility];  Service: Endoscopy;;  . EYE SURGERY    . KNEE ARTHROSCOPY Left 1992  . LIMBAL STEM CELL TRANSPLANT    . PAIN PUMP IMPLANTATION  2012  . PAIN PUMP IMPLANTATION N/A 09/04/2017   Procedure: INTRATHECAL PUMP BATTERY CHANGE;  Surgeon: Milinda Pointer, MD;  Location: [facility] OR[facility];  Service: Neurosurgery;  Laterality: N/A;  . PORTA CATH INSERTION N/A 12/04/2016   Procedure: Glori Luis Cath Insertion;  Surgeon: Algernon Huxley, MD;  Location: [facility];  Service: Cardiovascular;  Laterality: N/A;  . stem cell implant  2008   [facility]    Social History   Socioeconomic History  . Marital status: Married    Spouse name: Not on file  . Number of children: Not on file  . Years of education: Not on file  . Highest education level: Not on file  Occupational History  . Not on file  Tobacco Use  . Smoking status: Former Smoker    Packs/day: 2.50    Years: 12.00    Pack years:  30.00    Types: Cigarettes    Quit date: 1977    Years since quitting: 45.2  . Smokeless tobacco: Never Used  . Tobacco comment: Quit 40 years ago  Vaping Use  . Vaping Use: Never used  Substance and Sexual Activity  . Alcohol use: No    Alcohol/week: 0.0 standard drinks    Comment: might have 1 beer/month  . Drug use: No  . Sexual activity: Not on file  Other Topics Concern  . Not on file  Social History Narrative  . Not on file   Social Determinants of Health   Financial Resource Strain: Not on file  Food Insecurity: Not on file  Transportation Needs: Not on file  Physical Activity: Not on file  Stress: Not on file  Social Connections: Not on file  Intimate Partner Violence: Not on file    Family History  Problem Relation Age of Onset  . Cancer Father        throat  . Kidney disease Sister   . Stroke Other   . Stroke Mother   . Bladder Cancer Neg Hx   . Prostate cancer Neg Hx   . Kidney cancer Neg Hx      Current Outpatient Medications:   .  acetaminophen (TYLENOL) 500 MG tablet, Take 500 mg by mouth 2 (two) times a day., Disp: , Rfl:  .  baclofen (LIORESAL) 10 MG tablet, Take 1 tablet (10 mg total) by mouth 2 (two) times daily., Disp: 180 tablet, Rfl: 0 .  bismuth subsalicylate (PEPTO BISMOL) 262 MG/15ML suspension, Take 30 mLs by mouth every 6 (six) hours as needed for diarrhea or loose stools., Disp: , Rfl:  .  calcium citrate-vitamin D (CITRACAL+D) 315-200 MG-UNIT tablet, Take 1 tablet by mouth 2 (two) times daily., Disp: , Rfl:  .  cetirizine (ZYRTEC) 10 MG tablet, Take 10 mg by mouth daily as needed for allergies. , Disp: , Rfl:  .  Cholecalciferol (VITAMIN D3) 2000 units capsule, Take 2,000 Units daily by mouth. , Disp: , Rfl:  .  ciprofloxacin (CIPRO) 500 MG tablet, Take 1 tablet (500 mg total) by mouth 2 (two) times daily., Disp: 10 tablet, Rfl: 0 .  Daratumumab (DARZALEX IV), Inject 1,200 mg every 30 (thirty) days into the vein. , Disp: , Rfl:  .  dexamethasone (DECADRON) 4 MG tablet, TAKE 4 TABLETS BY MOUTH 1 HOUR PRIOR TO INFUSION, THEN 1 TABLET ON DAYS 2 AND 3 AS DIRECTED, Disp: 30 tablet, Rfl: 0 .  diltiazem (CARDIZEM CD) 120 MG 24 hr capsule, Take 120 mg daily by mouth. , Disp: , Rfl:  .  diltiazem (CARDIZEM) 60 MG tablet, Take 60 mg by mouth daily as needed (for increased heart rate > 140)., Disp: , Rfl:  .  diphenhydrAMINE (BENADRYL) 25 MG tablet, Take 25 mg by mouth as directed. With chemo treatment, Disp: , Rfl:  .  ELIQUIS 5 MG TABS tablet, TAKE 1 TABLET TWICE A DAY, Disp: 180 tablet, Rfl: 1 .  fluticasone (FLONASE) 50 MCG/ACT nasal spray, Place 1 spray into the nose daily as needed for allergies., Disp: , Rfl:  .  Hypromellose (ARTIFICIAL TEARS OP), Place 1 drop as needed into both eyes (for dry eyes)., Disp: , Rfl:  .  loperamide (IMODIUM) 2 MG capsule, Take 4 mg as needed by mouth for diarrhea or loose stools. , Disp: , Rfl:  .  loratadine (CLARITIN) 10 MG tablet, Take 10 mg by mouth daily as needed. , Disp: ,  Rfl:  .  lovastatin (MEVACOR) 20 MG tablet, Take 20 mg by mouth every evening. , Disp: , Rfl:  .  montelukast (SINGULAIR) 10 MG tablet, Take 1 tablet by mouth on the day before, day of, and 2 days after treatment., Disp: 30 tablet, Rfl: 1 .  Multiple Vitamins-Iron (MULTIVITAMIN/IRON PO), Take 1 tablet by mouth daily. , Disp: , Rfl:  .  naloxone (NARCAN) 2 MG/2ML injection, Inject 1 mL (1 mg total) into the muscle as needed for up to 2 doses (for opioid overdose). In case of emergency (overdose), inject into muscle of upper arm or leg and call 911., Disp: 2 mL, Rfl: 1 .  NON FORMULARY, IT pump  Fentanyl 1,500.0 mcg/ml Bupivicaine 30.0 mg/ml Clonidine 300.0 mcg/ml Rate 500.4 mg/day, Disp: , Rfl:  .  omeprazole (PRILOSEC) 20 MG capsule, Take 20 mg by mouth daily. , Disp: , Rfl:  .  ondansetron (ZOFRAN) 8 MG tablet, Take 1 tablet (8 mg total) by mouth as directed. Take with chemo and can take it as needed, Disp: 20 tablet, Rfl: 1 .  potassium chloride SA (KLOR-CON) 20 MEQ tablet, TAKE 1 TABLET(20 MEQ) BY MOUTH DAILY, Disp: 90 tablet, Rfl: 0 .  tamsulosin (FLOMAX) 0.4 MG CAPS capsule, TAKE 1 CAPSULE BY MOUTH DAILY, Disp: 90 capsule, Rfl: 2 .  valACYclovir (VALTREX) 500 MG tablet, TAKE 1 TABLET BY MOUTH EVERY DAY, Disp: 30 tablet, Rfl: 3 .  vitamin B-12 (CYANOCOBALAMIN) 1000 MCG tablet, Take 1,000 mcg daily by mouth. , Disp: , Rfl:   Physical exam:  Vitals:   12/22/20 1059 12/22/20 1107  BP:  (!) 145/90  Pulse:  83  Resp:  17  Temp:  97.8 F (36.6 C)  TempSrc:  Tympanic  SpO2:  99%  Weight: 165 lb 2 oz (74.9 kg)    Physical Exam Constitutional:      Appearance: Normal appearance.  HENT:     Head: Normocephalic and atraumatic.  Eyes:     Pupils: Pupils are equal, round, and reactive to light.  Cardiovascular:     Rate and Rhythm: Normal rate and regular rhythm.     Heart sounds: Normal heart sounds. No murmur heard.   Pulmonary:     Effort: Pulmonary effort is normal.     Breath  sounds: Normal breath sounds. No wheezing.  Abdominal:     General: Bowel sounds are normal. There is no distension.     Palpations: Abdomen is soft.     Tenderness: There is no abdominal tenderness.  Musculoskeletal:        General: Normal range of motion.     Cervical back: Normal range of motion.     Thoracic back: Bony tenderness present.       Back:  Skin:    General: Skin is warm and dry.     Findings: No rash.  Neurological:     Mental Status: He is alert and oriented to person, place, and time.  Psychiatric:        Judgment: Judgment normal.      CMP Latest Ref Rng & Units 12/22/2020  Glucose 70 - 99 mg/dL 103(H)  BUN 8 - 23 mg/dL 15  Creatinine 0.61 - 1.24 mg/dL 1.41(H)  Sodium 135 - 145 mmol/L 139  Potassium 3.5 - 5.1 mmol/L 3.5  Chloride 98 - 111 mmol/L 105  CO2 22 - 32 mmol/L 24  Calcium 8.9 - 10.3 mg/dL 8.8(L)  Total Protein 6.5 - 8.1 g/dL 6.0(L)  Total Bilirubin 0.3 -  1.2 mg/dL 0.7  Alkaline Phos 38 - 126 U/L 45  AST 15 - 41 U/L 18  ALT 0 - 44 U/L 15   CBC Latest Ref Rng & Units 12/22/2020  WBC 4.0 - 10.5 K/uL 7.8  Hemoglobin 13.0 - 17.0 g/dL 12.0(L)  Hematocrit 39.0 - 52.0 % 34.6(L)  Platelets 150 - 400 K/uL 184    No images are attached to the encounter.  DG Chest 2 View  Result Date: 12/22/2020 CLINICAL DATA:  Right lower lateral rib pain for the past 5 days. No injury. EXAM: CHEST - 2 VIEW COMPARISON:  Bone survey dated November 10, 2020. FINDINGS: Unchanged right chest wall port catheter. The heart size and mediastinal contours are within normal limits. Normal pulmonary vascularity. No focal consolidation, pleural effusion, or pneumothorax. No acute osseous abnormality. Unchanged chronic thoracic compression deformities. IMPRESSION: No active cardiopulmonary disease. Electronically Signed   By: Titus Dubin M.D.   On: 12/22/2020 10:37     Assessment and plan- Patient is a 75 y.o. male who presents for acute on chronic diarrhea and right-sided  lateral rib pain.  Worsening diarrhea has been present for the past 7 to 10 days since diagnosis of EPEC and initiating Cipro.  Rib pain started on Saturday.   Multiple myeloma: -Status post autologous stem cell transplant in relapse -Currently on daratumumab (12/05/2016-current) -M spike fluctuates between 0.1-0.4 g/dL in the past year without trend. -Most recent bone survey from 11/10/20 shows no focal lytic lesions and stable compression fractures of T5, T10 and T12. -Has pain pump managed by Dr. Dossie Arbour.  -He is scheduled for lab work and next daratumumab on 12/27/2020.  Right lateral rib pain: -Likely secondary to strain/sprain after picking up his dog on Saturday. -We will get x-ray for acute abnormality.  -This was negative.  -Reassuring that recent bone scan was negative for additional lytic lesions. -Pain is not constant but rather with specific movements. -Recommend heat and Tylenol/ibuprofen.  -We will reevaluate at his next visit.  Diarrhea: -Recently treated for EPEC with Cipro.  Had his last dose yesterday morning. -Diarrhea appears to have been exacerbated with antibiotics per patient. -He has not been taking his antidiarrheals. -We will check C. difficile and GI panel.   -C. difficile negative and GI panel negative. -Okay to initiate antidiarrheals. -Recommend hydration. -Labs from today do not show any electrolyte abnormalities.  Mild bump in his creatinine.  Patient would like to self hydrate.  Recommend Gatorade.   Disposition: -RTC as scheduled next week for lab work, MD assessment and next cycle of daratumumab.  Visit Diagnosis 1. Acute diarrhea   2. Rib pain     Patient expressed understanding and was in agreement with this plan. He also understands that He can call clinic at any time with any questions, concerns, or complaints.   Greater than 50% was spent in counseling and coordination of care with this patient including but not limited to discussion of the  relevant topics above (See A&P) including, but not limited to diagnosis and management of acute and chronic medical conditions.   Thank you for allowing me to participate in the care of this very pleasant patient.    Jacquelin Hawking, NP [place] at [facility] Cell - [phone] Pager- [phone] 12/23/2020 2:52 PM

## 2020-12-22 NOTE — Telephone Encounter (Signed)
Dr Mike Gip wants patient to be seen in Symptom Management Clinic, Patient agrees to go get his xray then come to [facility] for labs/ NP/ IV fluids he said he will leave his house by 10 AM and go to [facility] for XR

## 2020-12-22 NOTE — Telephone Encounter (Signed)
Patient called reporting that his diarrhea has gotten worse, more frequent and watery. He reports that he has has 59 stools since the first of March. He does report that Dr Grayland Ormond gave him another 5 days of Cipro over the weekend because he was still having diarrhea form the E Coli. He is asking if the Cipro could be causing the diarrhea to be worse. He states he quit taking it yesterday. He also reports that he feels he has "cracked a rib" and is asking if he needs to call his PCP for that or can you handle ordering an Xray. Please advise

## 2020-12-23 NOTE — Progress Notes (Signed)
[facility]  [address] [place], [place] [address] Phone: [phone]  Fax: [phone]   Clinic Day:  12/27/2020  Referring physician: Sofie Hartigan, MD  Chief Complaint: Logan Perez is a 75 y.o. 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  HPI: The patient was last seen in the medical oncology clinic on 12/13/2020 for a sick call visit. At that time, he noted increased diarrhea. Hematocrit was 37.3, hemoglobin 12.9, MCV 98.9, platelets 241,000, WBC 9,300 with an ANC of 6300. Creatinine was 1.33 (CrCl 56 ml/min). Total protein was 6.4. C diff was negative. GI panel was again positive for EPEC; he started ciprofloxacin 500 mg BID x 3 days.  He received IVF on 12/13/2020, 12/14/2020, 12/15/2020, and 12/16/2020.  BMP on 12/15/2020 revealed a creatinine of 1.27 (CrCl 59 ml/min).  The patient saw Faythe Casa, NP on 12/22/2020 for left rib pain and worsening diarrhea. He reported >59 episodes of diarrhea in March. Dr. Grayland Ormond had prescribed him another 5 days of ciprofloxacin on 12/18/2020; he had finished ciprofloxacin the day before. Anti-diarrheals were reinitiated. Hematocrit was 34.6, hemoglobin 12.0, MCV 99.7, platelets 184,000, WBC 7,800 with an ANC of . Creatinine was 1.41 (CrCl 52 ml/min). Calcium was 8.8. Total protein was 6.0. GI panel and C diff were negative. CXR showed no active cardiopulmonary disease.  Rib pain felt secondary to strain.  Tylenol/ibuprofen and heat were recommended.  During the interim, he has been doing "well." While he was taking Cipro, his diarrhea worsened, so he stopped it. He ended up taking 6 total days of Cipro. His bowels returned to normal (0-1 bowel movements per day). He had diarrhea yesterday after eating shrimp at a restaurant, but today his bowel movements are normal.  His back pain is not too bad; uses  Tylenol for relief. He denies shortness of breath, chest pain, palpitations, bleeding of any kind, and runny nose. His rib pain has resolved.  He took his Singulair. He takes acyclovir as prescribed. He is holding Klor-Con at this time.   Past Medical History:  Diagnosis Date  . Anxiety   . Atrial fibrillation ([place])   . BPH (benign prostatic hyperplasia)   . Complication of anesthesia    BAD HEADACHE NIGHT OF FIRST CATARACT  . Compression fracture of lumbar vertebra ([place])   . Difficulty voiding   . Dysrhythmia    A FIB  . Elevated PSA   . GERD (gastroesophageal reflux disease)   . Hearing aid worn    bilateral  . History of kidney stones   . HLD (hyperlipidemia)   . HOH (hard of hearing)   . Hypertension   . Multiple myeloma ([place])   . Neuropathy    feet. 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  . Pain    BACK  . Palpitations   . Pneumonia   . Pulmonary embolism ([place])   . Sepsis ([place])   . Stroke [facility])    TIA, detected on CT scan. pt was unaware    Past Surgical History:  Procedure Laterality Date  . BACK SURGERY  1994  . CATARACT EXTRACTION W/PHACO Right 05/02/2016   Procedure: CATARACT EXTRACTION PHACO AND INTRAOCULAR LENS PLACEMENT (IOC);  Surgeon: Birder Robson, MD;  Location: [facility] ORS;  Service: Ophthalmology;  Laterality: Right;  [place] 1.06 AP% 20.6 CDE 13.70 FLUID PACK LOT # [id number]  . CATARACT EXTRACTION W/PHACO Left 05/16/2016   Procedure: CATARACT EXTRACTION PHACO AND  INTRAOCULAR LENS PLACEMENT (IOC);  Surgeon: Birder Robson, MD;  Location: [facility] ORS;  Service: Ophthalmology;  Laterality: Left;  [place] 01:43 AP% 19.8 CDE 20.45 FLUID PACK LOT #[id number] H  . COLONOSCOPY WITH PROPOFOL N/A 03/22/2017   Procedure: COLONOSCOPY WITH PROPOFOL;  Surgeon: Lucilla Lame, MD;  Location: [facility];  Service: Endoscopy;  Laterality: N/A;  has port  . ESOPHAGOGASTRODUODENOSCOPY (EGD) WITH PROPOFOL  03/22/2017   Procedure: ESOPHAGOGASTRODUODENOSCOPY (EGD) WITH PROPOFOL;  Surgeon: Lucilla Lame, MD;  Location: [place];  Service: Endoscopy;;  . EYE SURGERY    . KNEE ARTHROSCOPY Left 1992  . LIMBAL STEM CELL TRANSPLANT    . PAIN PUMP IMPLANTATION  2012  . PAIN PUMP IMPLANTATION N/A 09/04/2017   Procedure: INTRATHECAL PUMP BATTERY CHANGE;  Surgeon: Milinda Pointer, MD;  Location: [facility];  Service: Neurosurgery;  Laterality: N/A;  . PORTA CATH INSERTION N/A 12/04/2016   Procedure: Glori Luis Cath Insertion;  Surgeon: Algernon Huxley, MD;  Location: [facility] [facility];  Service: Cardiovascular;  Laterality: N/A;  . stem cell implant  2008   [facility]    Family History  Problem Relation Age of Onset  . Cancer Father        throat  . Kidney disease Sister   . Stroke Other   . Stroke Mother   . Bladder Cancer Neg Hx   . Prostate cancer Neg Hx   . Kidney cancer Neg Hx     Social History:  reports that he quit smoking about 45 years ago. His smoking use included cigarettes. He has a 30.00 pack-year smoking history. He has never used smokeless tobacco. He reports that he does not drink alcohol and does not use drugs. He has a wire haired dachshund. He lives in [place]. His wife's name is [name]. The patient is alone today.  Allergies:  Allergies  Allergen Reactions  . Azithromycin Diarrhea and Other (See Comments)    Possible cause of C. Diff Possible cause of C. Diff Possible cause of C. Diff Possible cause of C. Diff  . Bee Pollen Other (See Comments)    Sneezing, watery eyes, runny nose  . Pollen Extract Other (See Comments)    Sneezing, watery eyes, runny nose  . 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 Rivaroxaban Rash    Current Medications: Current Outpatient Medications  Medication Sig Dispense Refill  . acetaminophen (TYLENOL) 500 MG tablet Take 500 mg by mouth 2 (two) times a day.    . baclofen (LIORESAL) 10 MG tablet Take 1 tablet (10 mg total) by mouth 2 (two) times daily. 180 tablet 0  .  bismuth subsalicylate (PEPTO BISMOL) 262 MG/15ML suspension Take 30 mLs by mouth every 6 (six) hours as needed for diarrhea or loose stools.    . calcium citrate-vitamin D (CITRACAL+D) 315-200 MG-UNIT tablet Take 1 tablet by mouth 2 (two) times daily.    . cetirizine (ZYRTEC) 10 MG tablet Take 10 mg by mouth daily as needed for allergies.     . Cholecalciferol (VITAMIN D3) 2000 units capsule Take 2,000 Units daily by mouth.     . Daratumumab (DARZALEX IV) Inject 1,200 mg every 30 (thirty) days into the vein.     [name] dexamethasone (DECADRON) 4 MG tablet TAKE 4 TABLETS BY MOUTH 1 HOUR PRIOR TO INFUSION, THEN 1 TABLET ON DAYS 2 AND 3 AS DIRECTED 30 tablet 0  . diltiazem (CARDIZEM CD) 120 MG  24 hr capsule Take 120 mg daily by mouth.     . diltiazem (CARDIZEM) 60 MG tablet Take 60 mg by mouth daily as needed (for increased heart rate > 140).    [name] diphenhydrAMINE (BENADRYL) 25 MG tablet Take 25 mg by mouth as directed. With chemo treatment    . ELIQUIS 5 MG TABS tablet TAKE 1 TABLET TWICE A DAY 180 tablet 1  . fluticasone (FLONASE) 50 MCG/ACT nasal spray Place 1 spray into the nose daily as needed for allergies.    . Hypromellose (ARTIFICIAL TEARS OP) Place 1 drop as needed into both eyes (for dry eyes).    [name] loperamide (IMODIUM) 2 MG capsule Take 4 mg as needed by mouth for diarrhea or loose stools.     [name] loratadine (CLARITIN) 10 MG tablet Take 10 mg by mouth daily as needed.     . lovastatin (MEVACOR) 20 MG tablet Take 20 mg by mouth every evening.     . montelukast (SINGULAIR) 10 MG tablet Take 1 tablet by mouth on the day before, day of, and 2 days after treatment. 30 tablet 1  . Multiple Vitamins-Iron (MULTIVITAMIN/IRON PO) Take 1 tablet by mouth daily.     . naloxone (NARCAN) 2 MG/2ML injection Inject 1 mL (1 mg total) into the muscle as needed for up to 2 doses (for opioid overdose). In case of emergency (overdose), inject into muscle of upper arm or leg and call 911. 2 mL 1  . NON FORMULARY IT pump   Fentanyl 1,500.0 mcg/ml Bupivicaine 30.0 mg/ml Clonidine 300.0 mcg/ml Rate 500.4 mg/day    . omeprazole (PRILOSEC) 20 MG capsule Take 20 mg by mouth daily.     . ondansetron (ZOFRAN) 8 MG tablet Take 1 tablet (8 mg total) by mouth as directed. Take with chemo and can take it as needed 20 tablet 1  . tamsulosin (FLOMAX) 0.4 MG CAPS capsule TAKE 1 CAPSULE BY MOUTH DAILY 90 capsule 2  . valACYclovir (VALTREX) 500 MG tablet TAKE 1 TABLET BY MOUTH EVERY DAY 30 tablet 3  . vitamin B-12 (CYANOCOBALAMIN) 1000 MCG tablet Take 1,000 mcg daily by mouth.     . ciprofloxacin (CIPRO) 500 MG tablet Take 1 tablet (500 mg total) by mouth 2 (two) times daily. (Patient not taking: Reported on 12/27/2020) 10 tablet 0  . potassium chloride SA (KLOR-CON) 20 MEQ tablet TAKE 1 TABLET(20 MEQ) BY MOUTH DAILY (Patient not taking: Reported on 12/27/2020) 90 tablet 0   No current facility-administered medications for this visit.    Review of Systems  Constitutional: Negative for chills, diaphoresis, fever, malaise/fatigue and weight loss (up 2 lbs).       Feels "well."  HENT: Positive for hearing loss. Negative for congestion, ear discharge, ear pain, nosebleeds, sinus pain, sore throat and tinnitus.   Eyes: Negative.  Negative for blurred vision, double vision, photophobia and pain.  Respiratory: Negative for cough, hemoptysis, sputum production, shortness of breath and wheezing.   Cardiovascular: Negative for chest pain, palpitations, orthopnea, leg swelling and PND.       H/o atrial fibrillation.  Gastrointestinal: Positive for diarrhea (resolved). Negative for abdominal pain, blood in stool, constipation, heartburn, melena, nausea and vomiting.  Genitourinary: Negative.  Negative for dysuria, frequency, hematuria and urgency.  Musculoskeletal: Positive for back pain (chronic; Fentanyl/bupivacaine/clonidine pump). Negative for falls, joint pain, myalgias and neck pain.  Skin: Negative.  Negative for itching and  rash.  Neurological: Positive for sensory change (neuropathy in feet, on baclofen). Negative  for dizziness, tingling, tremors, speech change, focal weakness, seizures, weakness and headaches.  Endo/Heme/Allergies: Negative.  Negative for environmental allergies. Does not bruise/bleed easily.  Psychiatric/Behavioral: Negative.  Negative for depression and memory loss. The patient is not nervous/anxious and does not have insomnia.   All other systems reviewed and are negative.  Performance status (ECOG): 1  Vitals Blood pressure (!) 143/70, pulse 79, temperature (!) 96.8 F (36 C), temperature source Tympanic, weight 165 lb 0.2 oz (74.9 kg).   Physical Exam Vitals and nursing note reviewed.  Constitutional:      General: He is not in acute distress.    Appearance: He is well-developed. He is not diaphoretic.  HENT:     Head: Normocephalic and atraumatic.     Comments: Short brown hair.    Mouth/Throat:     Mouth: Mucous membranes are moist.     Pharynx: Oropharynx is clear. No oropharyngeal exudate.  Eyes:     General: No scleral icterus.    Extraocular Movements: Extraocular movements intact.     Conjunctiva/sclera: Conjunctivae normal.     Pupils: Pupils are equal, round, and reactive to light.     Comments: Glasses. Blue eyes.  Neck:     Vascular: No JVD.  Cardiovascular:     Rate and Rhythm: Normal rate and regular rhythm.     Heart sounds: Normal heart sounds. No murmur heard. No gallop.   Pulmonary:     Effort: Pulmonary effort is normal. No respiratory distress.     Breath sounds: Normal breath sounds. No wheezing or rales.  Chest:  Breasts:     Right: No axillary adenopathy or supraclavicular adenopathy.     Left: No axillary adenopathy or supraclavicular adenopathy.    Abdominal:     General: Bowel sounds are normal. There is no distension.     Palpations: Abdomen is soft. There is no hepatomegaly, splenomegaly or mass.     Tenderness: There is no abdominal  tenderness. There is no right CVA tenderness, left CVA tenderness, guarding or rebound.  Musculoskeletal:        General: No tenderness. Normal range of motion.     Cervical back: Normal range of motion and neck supple.  Lymphadenopathy:     Head:     Right side of head: No preauricular, posterior auricular or occipital adenopathy.     Left side of head: No preauricular, posterior auricular or occipital adenopathy.     Cervical: No cervical adenopathy.     Upper Body:     Right upper body: No supraclavicular or axillary adenopathy.     Left upper body: No supraclavicular or axillary adenopathy.     Lower Body: No right inguinal adenopathy. No left inguinal adenopathy.  Skin:    General: Skin is warm and dry.     Coloration: Skin is not pale.     Findings: No erythema or rash.  Neurological:     Mental Status: He is alert and oriented to person, place, and time.  Psychiatric:        Behavior: Behavior normal.        Thought Content: Thought content normal.        Judgment: Judgment normal.    Appointment on 12/27/2020  Component Date Value Ref Range Status  . Magnesium 12/27/2020 1.9  1.7 - 2.4 mg/dL Final   Performed at [facility], [address], [place], [place] [address]  . Sodium 12/27/2020 136  135 - 145 mmol/L Final  .  Potassium 12/27/2020 3.8  3.5 - 5.1 mmol/L Final  . Chloride 12/27/2020 104  98 - 111 mmol/L Final  . CO2 12/27/2020 22  22 - 32 mmol/L Final  . Glucose, Bld 12/27/2020 161* 70 - 99 mg/dL Final   Glucose reference range applies only to samples taken after fasting for at least 8 hours.  . BUN 12/27/2020 15  8 - 23 mg/dL Final  . Creatinine, Ser 12/27/2020 1.10  0.61 - 1.24 mg/dL Final  . Calcium 12/27/2020 8.8* 8.9 - 10.3 mg/dL Final  . Total Protein 12/27/2020 6.1* 6.5 - 8.1 g/dL Final  . Albumin 12/27/2020 4.0  3.5 - 5.0 g/dL Final  . AST 12/27/2020 21  15 - 41 U/L Final  . ALT 12/27/2020 12  0 - 44 U/L Final  . Alkaline Phosphatase  12/27/2020 42  38 - 126 U/L Final  . Total Bilirubin 12/27/2020 0.8  0.3 - 1.2 mg/dL Final  . GFR, Estimated 12/27/2020 >60  >60 mL/min Final   Comment: (NOTE) Calculated using the CKD-EPI Creatinine Equation (2021)   . Anion gap 12/27/2020 10  5 - 15 Final   Performed at [facility], [address], [place], [place] [address]  . WBC 12/27/2020 5.9  4.0 - 10.5 K/uL Final  . RBC 12/27/2020 3.56* 4.22 - 5.81 MIL/uL Final  . Hemoglobin 12/27/2020 12.3* 13.0 - 17.0 g/dL Final  . HCT 12/27/2020 35.6* 39.0 - 52.0 % Final  . MCV 12/27/2020 100.0  80.0 - 100.0 fL Final  . MCH 12/27/2020 34.6* 26.0 - 34.0 pg Final  . MCHC 12/27/2020 34.6  30.0 - 36.0 g/dL Final  . RDW 12/27/2020 13.8  11.5 - 15.5 % Final  . Platelets 12/27/2020 191  150 - 400 K/uL Final  . nRBC 12/27/2020 0.0  0.0 - 0.2 % Final  . Neutrophils Relative % 12/27/2020 70  % Final  . Neutro Abs 12/27/2020 4.1  1.7 - 7.7 K/uL Final  . Lymphocytes Relative 12/27/2020 23  % Final  . Lymphs Abs 12/27/2020 1.4  0.7 - 4.0 K/uL Final  . Monocytes Relative 12/27/2020 4  % Final  . Monocytes Absolute 12/27/2020 0.2  0.1 - 1.0 K/uL Final  . Eosinophils Relative 12/27/2020 2  % Final  . Eosinophils Absolute 12/27/2020 0.1  0.0 - 0.5 K/uL Final  . Basophils Relative 12/27/2020 1  % Final  . Basophils Absolute 12/27/2020 0.0  0.0 - 0.1 K/uL Final  . Immature Granulocytes 12/27/2020 0  % Final  . Abs Immature Granulocytes 12/27/2020 0.02  0.00 - 0.07 K/uL Final   Performed at [facility], [address], [place], [place] [address]    Assessment:  Logan Perez is a 75 y.o. male with stage III mutiple myeloma. He initially presented with progressive back pain beginning in 12/2006. MRI revealed "spots and compression fractures". He began Velcade, thalidomide, and Decadron. In 08/2007, he underwent high dose chemotherapy and autologous stem cell transplant. He underwent 2nd autologous stem cell transplant  on 06/16/2016.  He recurred with a rising M-spike (2.7) with repeat M spike (1.7 gm/dl) in 03/2010. He was initially treated with Velcade (02/08/2010 - 05/10/2010). He then began Revlimid (15 mg 3 weeks on/1 week off) and Decadron (40 mg on day 1, 8, 15, 22). Because of significant side effect with Decadron his dose was decreased to 10 mg once a week in 07/2010.   He was on maintenance Revlimid. Revlimid was initially10 mg 3 weeks on/1 week  off. This was changed to 10 mg 2 weeks on/2 weeks off secondary to right nipple tenderness. His dose was increased to 10 mg 3 weeks on/1 week off with Decadron 10 mg a week (on Sundays) and then Revlamid 15 mg 3 weeks on and 1 week off with Decadron on Sundays. He began Pomalyst4 mg 3 weeks on/1 week off with Decadron on 08/27/2015.  SPEPrevealed no monoclonal protein (04/21/2015) and 0.5 gm/dL on 09/22/2015 and 10/20/2015. M spikewas 0.1 on 02/02/2016, 03/01/2016, 03/29/2016, 04/26/2016, and 05/24/2016. M spikewas 0 on 10/11/2016, 11/28/2016, 02/05/2017, 03/21/2017, 08/02/2017, 10/04/2017, 12/27/2017, 01/24/2018, and 02/21/2018. M spikewas 0.1 on 11/01/2017, 0.1 on 11/29/2017, 0 on 12/27/2017, 0 on 01/24/2018, 0 on 02/21/2018, 0.1 on 03/21/2018, 0.3 on 04/18/2018, 0.1 on 05/16/2018, 0 on 06/13/2018, 0.2 on 08/08/2018, 0 on 09/09/2018, 0.1 on 11/04/2018, 0 on 12/02/2018, 0.1 on 12/30/2018, 0 on 01/27/2019, 0.1 on 02/24/2019, 0.1 on 03/24/2019, 0.1 on 04/23/2019, 0 on 05/19/2019, 0.1 on 06/17/2019, 0.2 on 07/15/2019, 0.2 on 08/18/2019, 0.2 on 09/15/2019, 0.1 on 10/13/2019, 0.1 on 11/13/2019, 0 on 12/11/2019, 0.3 on 01/08/2020, 0.2 on 02/05/2020, 0.2 on 03/04/2020, 0.2 on 04/01/2020, 0 on 05/05/2020, 0.4 on 06/07/2020, 0.4 on 07/06/2020, 0.3 on 08/03/2020, 0.1 on 08/31/2020, 0.3 on 09/28/2020, and 0.2 on 10/26/2020.  Free light chainshave been monitored. Kappa free light chainswere 18.54 on 11/21/2013, 18.37 on 02/20/2014, 18.93 on 05/22/2014, 32.58  (high; normal ratio 1.27) on 08/21/2014, 51.53 (high; elevated ratio of 2.12) on 11/13/2014, 28.08 (ratio 1.73) on 12/09/2014, 23.71 (ratio 2.17) on 01/18/2015, 92.93 (ratio 9.49) on 04/21/2015, 93.44 (ratio 10.28) on 05/26/2015, 255.45 (ratio of 24.05) on 07/14/2015, 373.89 (ratio 48.31) on 08/04/2015, 474.33 (ratio 70.58) on 08/25/2015, 450.76 (ratio 55.44) on 09/22/2015, 453.4 (ratio 59.89) on 10/20/2015, 58.26 (ratio of >40.74) on 02/02/2016, 62.67 (ratio of 37.98) on 03/01/2016, 75.7 (ratio >50.47) on 03/29/2016, 79.7 (ratio 53.13) on 04/26/2016, 108.6 (ratio >72.4) on 05/24/2016, 8.3 (1.46 ratio) on 10/11/2016, 6.7 (0.81 ratio) on 02/05/2017, 7.8 (1.01 ratio) on 03/21/2017,7.6 (ratio 0.63) on 06/01/2017, 8.1 (ratio 1.13) on 11/29/2017, 9.6 (ratio 1.55) on 06/13/2018, 6.6 (ratio 2.06) on 07/11/2018, 6.9 (ratio 0.82) on 08/08/2018, 5.8 (ratio 1.66) on 09/09/2018, 5.8 (ratio 1.05) on 11/04/2018, 5.6(ratio 2.55)on 12/02/2018, 5.5 (ratio 1.96) on 01/27/2019, 7.6 (ratio 2.38) on 06/17/2019, 6.9 (ratio 1.21) on 11/13/2019, 6.6 (ratio 2.0) on 12/11/2019, 7.9 (ratio 2.14) on 02/05/2020, 6.6 (ratio 2.28) on 04/01/2020, 7.0 (ratio 2.12) on 05/05/2020, 9.3 (ratio 2.16) on 07/06/2020,  7.1 (ratio 2.09) on 08/31/2020, and 6.9 (ratio 2.09) on 10/26/2020.  Bone surveyon 12/08/2014 was stable. Bone survey on 10/21/2015 revealed increase conspicuity of subcentimeter lytic lesions in the calvarium.  PET scan on 08/14/2018 revealed no evidence of active myeloma.  Bone survey on 11/04/2019 revealed stable small lytic lesions throughout the skull.  There were no other lytic lesions.  Bone marrow aspirate and biopsyon 11/04/2015 revealed an atypical monoclonal plasma cells estimated at 30-40% of marrow cells. Marrow was variably cellular (approximately 45%) with background trilineage hematopoiesis. There was no significant increase in marrow reticulin fibers. Storage iron was present.   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 He develoed herpes zoster in 04/2008. He developed a pulmonary embolismin 05/2013. He was initially on Xarelto, but is now on Eliquis. He had an episode of pneumonia around this time requiring a brief admission. He developed severe lower leg cramps on 08/07/2014 secondary to hypokalemia. Duplex was negative.   He was treated forC difficile colitis(Flagyl completed 07/30/2015). He has  a chronic indwelling pain pump.  He received 4 cycles of Pomalyst and Decadron(08/27/2015 - 11/19/2015). Restaging studies document progressive disease. Kappa free light chains are increasing. SPEP revealed 0.5 gm/dL monoclonal protein then 1.3 gm/dL. Bone survey reveals increase conspicuity of subcentimeter lytic lesions in the calvarium. Bone marrow reveals 30-40% plasma cells.   MUGAon 11/30/2015 revealed an ejection fraction of 46%. He is felt not to be a good candidate for Kyprolis. There were no focal wall motion abnormalities. He had a stress echo less than 1 year ago. He has a history of PVCs and atrial fibrillation. He takes Cardizem prn.  He received 17 weeks of daratumumab(Darzalex) (12/09/2015 - 05/25/2016). He tolerated treatment well without side effect.  Bone marrow on 02/09/2016 revealed no diagnostic morphologic evidence of plasma cell myeloma. Marrow was normocellular to hypocellular marrow for age (ranging from 10-40%) with maturing trilineage hematopoiesis and mild multilineage dyspoiesis. There was patchy mild increase in reticulin. Storage iron was present. Flow cytometry revealed no definitive evidence of monoclonality. There was a non-specific atypical myeloid and monocytic findings with no increase in blasts. Cytogenetics were normal (46, XY).  He is currently54 months s/p 2nd autologous stem cell transplanton 06/16/2016. 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 He also experienced atrial fibrillation with intermittent episodes of RVR requiring IV beta blockers. He is on prophylactic valacyclovir for 1 year post transplant.  He started vaccinations(DTaP-Pediatric triple vaccine, Hep B- Pediatrix triple vaccine, Haemophilus influenza B (Hib), inactivated polio virus (IPV), pneumococcal conjugate vaccine 13-valent (PCV 13)) on 04/17/2017. Recommendation was for Shingrix vaccine to be given in [place] and second dose of Shingrix in 2 months. He had follow-up vaccinations on 08/28/2017. He had his second shingles vaccine. He received 18 month vaccinations- DTaP-Pediatric triple vaccine, Hep B- Pediatrix triple vaccine, Haemophilus influenza B (Hib), inactivated polio virus (IPV), pneumococcal conjugate vaccine 13-valent (PCV 13) on 02/05/2018.  He is s/p50 cycles ofdaratumumab(Darzalex) post transplant(12/05/2016 -10/26/2020).  PET scanon 08/14/2018 revealed no evidence of active myeloma on whole-body FDG PET scan. There was no evidence soft tissue plasmacytoma. There was no clear evidence of lytic lesions on the CT portion exam. Some lucencies in the pelvis were stable. There were chronic compression fractures in the lower thoracic spine.  Bone survey on 11/04/2019 revealed stable small lytic lesions noted throughout the skull.  There were no other lytic lesions. Exam was stable.  Bone survey on 11/10/2020 revealed no focal lytic lesions.  There are stable compression fractures of T5, T10 and T12.   He has chronic diarrhea. GI panel by PCR was + enteropathogenic E coli (EPEC) on 11/30/2020 and 12/13/2020.  Stool was + for adenovirus on 11/30/2020.  Stool was negative for EPEC on 12/22/2020.  He was treated with ciprofloxacin (last 12/21/2020).  He has a history of hypomagnesemiathat required IV magnesium (2-4 gm) weekly. He has not required magnesium since 10/24/2016. Patient has atrial fibrillationand is on  Eliquis.  Symptomatically, he feels "well." His bowels have returned to normal.  He is ready to restart daratumumab.  Plan: 1.   Labs today: CBC with diff, CMP, Mg, SPEP, FLCA. 2.Multiple myeloma Clinically, he is doing well. M spike fluctuates between 0.1 - 0.4 gm/dL in the past year without trend. M-spike is 0.5 gm/dL today (available after clinic). PET scan on 08/14/2018 revealed no evidence of active myeloma. Bone survey on 11/10/2020 revealed no focal lytic lesions.             There  were stable compression fractions of T5, T10 and T12. Labs reviewed.  Cycle #51 daratumumab today.    He continues premedications taken at home: Tylenol, Benadryl, ondansetron, Singulair and Decadron.    Takes Decadron 24 mg a month (discuss potential for decreasing dose).  Consider switch to daratumumab SQ. Continue visits every other month unless aggressive trend in M spike. Continue prophylactic valacyclovir (until 3 months s/pdaratumumab). He is no longer followed at the transplant center. Follow-up with Dr. Adriana Simas via phone periodically. Discuss symptom management.  He has antiemetics at home to use on a prn bases.  Interventions are adequate.          3. Macrocytic anemia Hematocrit35.6. Hemoglobin12.3. MCV100.0. B12 and folate were normal on 12/11/2019.  TSH was 4.542 (slightly elevated) with a normal free T4 on 12/11/2019. Check B12 and folate annually. 4.Neuropathy Neuropathy main stable. He remains on baclofen. 5.Chronic diarrhea Diarrhea is back to baseline. He has had interval E coli (EPEC) and adenovirus.  Diarrhea has resolved.  Daratumumab side effects: 16% diarrhea Continue to monitor. 6.History of pulmonary embolism(2014) Continue Eliquis.  He denies any bruising or bleeding. 7.   Cycle #51 daratumumab today. 8.   RTC in 4 weeks for MD assessment, labs (CBC with diff, CMP, Mg, SPEP, FLCA) and  cycle #52 daratumumab.  I discussed the assessment and treatment plan with the patient.  The patient was provided an opportunity to ask questions and all were answered.  The patient agreed with the plan and demonstrated an understanding of the instructions.  The patient was advised to call back if the symptoms worsen or if the condition fails to improve as anticipated.   Lequita Asal, MD, PhD    12/27/2020, 10:11 AM  I, Mirian Mo Tufford, am acting as Education administrator for [employer]. Mike Gip, MD, PhD.  I, Petrina Melby C. Mike Gip, MD, have reviewed the above documentation for accuracy and completeness, and I agree with the above.

## 2020-12-24 ENCOUNTER — Other Ambulatory Visit: Payer: Self-pay | Admitting: *Deleted

## 2020-12-24 DIAGNOSIS — C9000 Multiple myeloma not having achieved remission: Secondary | ICD-10-CM

## 2020-12-24 NOTE — Progress Notes (Signed)
cm

## 2020-12-25 DIAGNOSIS — D649 Anemia, unspecified: Secondary | ICD-10-CM | POA: Insufficient documentation

## 2020-12-27 ENCOUNTER — Inpatient Hospital Stay (HOSPITAL_BASED_OUTPATIENT_CLINIC_OR_DEPARTMENT_OTHER): Payer: Medicare Other | Admitting: Hematology and Oncology

## 2020-12-27 ENCOUNTER — Encounter: Payer: Self-pay | Admitting: Hematology and Oncology

## 2020-12-27 ENCOUNTER — Inpatient Hospital Stay: Payer: Medicare Other

## 2020-12-27 ENCOUNTER — Other Ambulatory Visit: Payer: Self-pay

## 2020-12-27 VITALS — BP 143/70 | HR 79 | Temp 96.8°F | Wt 165.0 lb

## 2020-12-27 DIAGNOSIS — C9000 Multiple myeloma not having achieved remission: Secondary | ICD-10-CM | POA: Diagnosis not present

## 2020-12-27 DIAGNOSIS — G629 Polyneuropathy, unspecified: Secondary | ICD-10-CM

## 2020-12-27 DIAGNOSIS — D649 Anemia, unspecified: Secondary | ICD-10-CM

## 2020-12-27 DIAGNOSIS — Z5112 Encounter for antineoplastic immunotherapy: Secondary | ICD-10-CM

## 2020-12-27 DIAGNOSIS — I2782 Chronic pulmonary embolism: Secondary | ICD-10-CM

## 2020-12-27 DIAGNOSIS — Z9484 Stem cells transplant status: Secondary | ICD-10-CM | POA: Diagnosis not present

## 2020-12-27 DIAGNOSIS — C9002 Multiple myeloma in relapse: Secondary | ICD-10-CM

## 2020-12-27 DIAGNOSIS — Z7901 Long term (current) use of anticoagulants: Secondary | ICD-10-CM

## 2020-12-27 DIAGNOSIS — K529 Noninfective gastroenteritis and colitis, unspecified: Secondary | ICD-10-CM

## 2020-12-27 LAB — COMPREHENSIVE METABOLIC PANEL
ALT: 12 U/L (ref 0–44)
AST: 21 U/L (ref 15–41)
Albumin: 4 g/dL (ref 3.5–5.0)
Alkaline Phosphatase: 42 U/L (ref 38–126)
Anion gap: 10 (ref 5–15)
BUN: 15 mg/dL (ref 8–23)
CO2: 22 mmol/L (ref 22–32)
Calcium: 8.8 mg/dL — ABNORMAL LOW (ref 8.9–10.3)
Chloride: 104 mmol/L (ref 98–111)
Creatinine, Ser: 1.1 mg/dL (ref 0.61–1.24)
GFR, Estimated: >60 mL/min (ref >60)
Glucose, Bld: 161 mg/dL — ABNORMAL HIGH (ref 70–99)
Potassium: 3.8 mmol/L (ref 3.5–5.1)
Sodium: 136 mmol/L (ref 135–145)
Total Bilirubin: 0.8 mg/dL (ref 0.3–1.2)
Total Protein: 6.1 g/dL — ABNORMAL LOW (ref 6.5–8.1)

## 2020-12-27 LAB — CBC WITH DIFFERENTIAL/PLATELET
Abs Immature Granulocytes: 0.02 10*3/uL (ref 0.00–0.07)
Basophils Absolute: 0 10*3/uL (ref 0.0–0.1)
Basophils Relative: 1 %
Eosinophils Absolute: 0.1 10*3/uL (ref 0.0–0.5)
Eosinophils Relative: 2 %
HCT: 35.6 % — ABNORMAL LOW (ref 39.0–52.0)
Hemoglobin: 12.3 g/dL — ABNORMAL LOW (ref 13.0–17.0)
Immature Granulocytes: 0 %
Lymphocytes Relative: 23 %
Lymphs Abs: 1.4 10*3/uL (ref 0.7–4.0)
MCH: 34.6 pg — ABNORMAL HIGH (ref 26.0–34.0)
MCHC: 34.6 g/dL (ref 30.0–36.0)
MCV: 100 fL (ref 80.0–100.0)
Monocytes Absolute: 0.2 10*3/uL (ref 0.1–1.0)
Monocytes Relative: 4 %
Neutro Abs: 4.1 10*3/uL (ref 1.7–7.7)
Neutrophils Relative %: 70 %
Platelets: 191 10*3/uL (ref 150–400)
RBC: 3.56 MIL/uL — ABNORMAL LOW (ref 4.22–5.81)
RDW: 13.8 % (ref 11.5–15.5)
WBC: 5.9 10*3/uL (ref 4.0–10.5)
nRBC: 0 % (ref 0.0–0.2)

## 2020-12-27 LAB — MAGNESIUM: Magnesium: 1.9 mg/dL (ref 1.7–2.4)

## 2020-12-27 MED ORDER — HEPARIN SOD (PORK) LOCK FLUSH 100 UNIT/ML IV SOLN
500.0000 [IU] | Freq: Once | INTRAVENOUS | Status: AC
  Administered 2020-12-27: 500 [IU] via INTRAVENOUS
  Filled 2020-12-27: qty 5

## 2020-12-27 MED ORDER — SODIUM CHLORIDE 0.9 % IV SOLN
Freq: Once | INTRAVENOUS | Status: AC
  Filled 2020-12-27: qty 250

## 2020-12-27 MED ORDER — SODIUM CHLORIDE 0.9 % IV SOLN
16.0000 mg/kg | Freq: Once | INTRAVENOUS | Status: AC
  Administered 2020-12-27: 1200 mg via INTRAVENOUS
  Filled 2020-12-27: qty 60

## 2020-12-27 NOTE — Progress Notes (Signed)
Patient here for oncology follow-up appointment, expresses no complaints or concerns at this time.    

## 2020-12-28 LAB — KAPPA/LAMBDA LIGHT CHAINS
Kappa free light chain: 5.6 mg/L (ref 3.3–19.4)
Kappa, lambda light chain ratio: 1.27 (ref 0.26–1.65)
Lambda free light chains: 4.4 mg/L — ABNORMAL LOW (ref 5.7–26.3)

## 2020-12-29 LAB — PROTEIN ELECTROPHORESIS, SERUM
A/G Ratio: 1.3 (ref 0.7–1.7)
Albumin ELP: 3.5 g/dL (ref 2.9–4.4)
Alpha-1-Globulin: 0.2 g/dL (ref 0.0–0.4)
Alpha-2-Globulin: 0.9 g/dL (ref 0.4–1.0)
Beta Globulin: 0.8 g/dL (ref 0.7–1.3)
Gamma Globulin: 0.7 g/dL (ref 0.4–1.8)
Globulin, Total: 2.6 g/dL (ref 2.2–3.9)
M-Spike, %: 0.5 g/dL — ABNORMAL HIGH
Total Protein ELP: 6.1 g/dL (ref 6.0–8.5)

## 2020-12-31 ENCOUNTER — Other Ambulatory Visit: Payer: Self-pay | Admitting: Hematology and Oncology

## 2021-01-09 DIAGNOSIS — E860 Dehydration: Secondary | ICD-10-CM | POA: Insufficient documentation

## 2021-01-10 ENCOUNTER — Telehealth: Payer: Self-pay

## 2021-01-10 ENCOUNTER — Telehealth: Payer: Self-pay | Admitting: Hematology and Oncology

## 2021-01-10 NOTE — Telephone Encounter (Signed)
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

## 2021-01-10 NOTE — Telephone Encounter (Signed)
Message sent to Dr Naveira 

## 2021-01-10 NOTE — Telephone Encounter (Signed)
01/10/2021 Spoke w/ pt and informed him that clinic will be closed on 4/18, per [place], and that his appts have been r/s to the [facility]. Pt is agreeable to this change  SRW

## 2021-01-10 NOTE — Telephone Encounter (Signed)
Spoke with patient and his wife had noticed the first 2 dx on the AVS and found them to be a surprise.  States they had never had a discussion about these dx.  Just would like some clarification.   You saw Logan Cola, MD on Tuesday November 02, 2020. 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) PUMP REFILL Informed Consent Details: Physician/ Practitioner Attestation; Transcribe to consent form and obtain patient signature 110/72 Blood Pressure 26.16 BMI 167 lb Weight 5\' 7"  Height 97 F Temperature 88 Pulse 99% Oxygen Saturation Done Today Today's

## 2021-01-11 ENCOUNTER — Other Ambulatory Visit: Payer: Self-pay

## 2021-01-11 MED ORDER — PAIN MANAGEMENT IT PUMP REFILL: 1.0000 | Freq: Once | INTRATHECAL | 0 refills | Status: AC

## 2021-01-23 NOTE — Progress Notes (Signed)
[facility]  [address] [place], [place] [address] Phone: [phone]  Fax: [phone]   Clinic Day:  01/23/2021  Referring physician: Lequita Asal, MD  Chief Complaint: Logan Perez is a 75 y.o. 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  HPI: The patient was last seen in the medical oncology clinic on 12/27/2020.  He appeared to be doing well with dramatic improvement of his diarrhea.  Lab work showed hemoglobin 12.3, hematocrit 35.6, MCV 100.0, platelet 191, WBC 5.9 with ANC of 4100.  Creatinine was 1.10.   He was diagnosed with EPEC and treated with 6 days of ciprofloxacin 500 mg twice daily completing on 12/18/2020.  Unfortunately developed worsening diarrhea and self stopped.  Repeat C. difficile sample was negative.  Diarrhea has resolved.  He is having normal daily bowel movements.  He has persistent low back pain due to malignancy related compression fracture and is followed by Dr. Dossie Arbour.  Has pain pump.  He took his Singulair. He takes acyclovir as prescribed. He is holding Klor-Con at this time.  He last received Darzalex on 12/27/2020.  Today, he states he feels good.  Denies any additional diarrhea or concerns with his bowels.  Back pain is stable and managed well with pain pump.  Denies any new concerns today.   Past Medical History:  Diagnosis Date  . Anxiety   . Atrial fibrillation ([place])   . BPH (benign prostatic hyperplasia)   . Complication of anesthesia    BAD HEADACHE NIGHT OF FIRST CATARACT  . Compression fracture of lumbar vertebra ([place])   . Difficulty voiding   . Dysrhythmia    A FIB  . Elevated PSA   . GERD (gastroesophageal reflux disease)   . Hearing aid worn    bilateral  . History of kidney stones   . HLD (hyperlipidemia)   . HOH (hard of hearing)   . Hypertension   . Multiple myeloma ([place])   .  Neuropathy    feet. 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  . Pain    BACK  . Palpitations   . Pneumonia   . Pulmonary embolism ([place])   . Sepsis ([place])   . Stroke [facility])    TIA, detected on CT scan. pt was unaware    Past Surgical History:  Procedure Laterality Date  . BACK SURGERY  1994  . CATARACT EXTRACTION W/PHACO Right 05/02/2016   Procedure: CATARACT EXTRACTION PHACO AND INTRAOCULAR LENS PLACEMENT (IOC);  Surgeon: Birder Robson, MD;  Location: [facility] ORS;  Service: Ophthalmology;  Laterality: Right;  [place] 1.06 AP% 20.6 CDE 13.70 FLUID PACK LOT # [id number]  . CATARACT EXTRACTION W/PHACO Left 05/16/2016   Procedure: CATARACT EXTRACTION PHACO AND INTRAOCULAR LENS PLACEMENT (IOC);  Surgeon: Birder Robson, MD;  Location: [facility] OR[facility];  Service: Ophthalmology;  Laterality: Left;  [place] 01:43 AP% 19.8 CDE 20.45 FLUID PACK LOT #[id number] H  . COLONOSCOPY WITH PROPOFOL N/A 03/22/2017   Procedure: COLONOSCOPY WITH PROPOFOL;  Surgeon: Lucilla Lame, MD;  Location: [place];  Service: Endoscopy;  Laterality: N/A;  has port  . ESOPHAGOGASTRODUODENOSCOPY (EGD) WITH PROPOFOL  03/22/2017   Procedure: ESOPHAGOGASTRODUODENOSCOPY (EGD) WITH PROPOFOL;  Surgeon: Lucilla Lame, MD;  Location: [place];  Service: Endoscopy;;  . EYE SURGERY    . KNEE ARTHROSCOPY Left 1992  . LIMBAL STEM CELL TRANSPLANT    . PAIN PUMP IMPLANTATION  2012  . PAIN PUMP IMPLANTATION N/A  09/04/2017   Procedure: INTRATHECAL PUMP BATTERY CHANGE;  Surgeon: Milinda Pointer, MD;  Location: [facility];  Service: Neurosurgery;  Laterality: N/A;  . PORTA CATH INSERTION N/A 12/04/2016   Procedure: Glori Luis Cath Insertion;  Surgeon: Algernon Huxley, MD;  Location: [place] [facility];  Service: Cardiovascular;  Laterality: N/A;  . stem cell implant  2008   [facility]    Family History  Problem Relation Age of Onset  . Cancer Father        throat  . Kidney disease Sister   . Stroke Other   . Stroke Mother   . Bladder Cancer Neg Hx   . Prostate  cancer Neg Hx   . Kidney cancer Neg Hx     Social History:  reports that he quit smoking about 45 years ago. His smoking use included cigarettes. He has a 30.00 pack-year smoking history. He has never used smokeless tobacco. He reports that he does not drink alcohol and does not use drugs. He has a wire haired dachshund. He lives in [place]. His wife's name is [name]. The patient is alone today.  Allergies:  Allergies  Allergen Reactions  . Azithromycin Diarrhea and Other (See Comments)    Possible cause of C. Diff Possible cause of C. Diff Possible cause of C. Diff Possible cause of C. Diff  . Bee Pollen Other (See Comments)    Sneezing, watery eyes, runny nose  . Pollen Extract Other (See Comments)    Sneezing, watery eyes, runny nose  . 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 Rivaroxaban Rash    Current Medications: Current Outpatient Medications  Medication Sig Dispense Refill  . acetaminophen (TYLENOL) 500 MG tablet Take 500 mg by mouth 2 (two) times a day.    . baclofen (LIORESAL) 10 MG tablet Take 1 tablet (10 mg total) by mouth 2 (two) times daily. 180 tablet 0  . bismuth subsalicylate (PEPTO BISMOL) 262 MG/15ML suspension Take 30 mLs by mouth every 6 (six) hours as needed for diarrhea or loose stools.    . calcium citrate-vitamin D (CITRACAL+D) 315-200 MG-UNIT tablet Take 1 tablet by mouth 2 (two) times daily.    . cetirizine (ZYRTEC) 10 MG tablet Take 10 mg by mouth daily as needed for allergies.     . Cholecalciferol (VITAMIN D3) 2000 units capsule Take 2,000 Units daily by mouth.     . ciprofloxacin (CIPRO) 500 MG tablet Take 1 tablet (500 mg total) by mouth 2 (two) times daily. (Patient not taking: Reported on 12/27/2020) 10 tablet 0  . Daratumumab (DARZALEX IV) Inject 1,200 mg every 30 (thirty) days into the vein.     [name] dexamethasone (DECADRON) 4 MG tablet TAKE 4 TABLETS BY MOUTH 1 HOUR PRIOR TO INFUSION, THEN  1 TABLET ON DAYS 2 AND 3 AS DIRECTED 30 tablet 0  . diltiazem (CARDIZEM CD) 120 MG 24 hr capsule Take 120 mg daily by mouth.     . diltiazem (CARDIZEM) 60 MG tablet Take 60 mg by mouth daily as needed (for increased heart rate > 140).    [name] diphenhydrAMINE (BENADRYL) 25 MG tablet Take 25 mg by mouth as directed. With chemo treatment    . ELIQUIS 5 MG TABS tablet TAKE 1 TABLET TWICE A DAY 180 tablet 1  . fluticasone (FLONASE) 50 MCG/ACT nasal spray Place 1 spray into the nose daily as needed for allergies.    . Hypromellose (ARTIFICIAL  TEARS OP) Place 1 drop as needed into both eyes (for dry eyes).    [name] loperamide (IMODIUM) 2 MG capsule Take 4 mg as needed by mouth for diarrhea or loose stools.     [name] loratadine (CLARITIN) 10 MG tablet Take 10 mg by mouth daily as needed.     . lovastatin (MEVACOR) 20 MG tablet Take 20 mg by mouth every evening.     . montelukast (SINGULAIR) 10 MG tablet Take 1 tablet by mouth on the day before, day of, and 2 days after treatment. 30 tablet 1  . Multiple Vitamins-Iron (MULTIVITAMIN/IRON PO) Take 1 tablet by mouth daily.     . naloxone (NARCAN) 2 MG/2ML injection Inject 1 mL (1 mg total) into the muscle as needed for up to 2 doses (for opioid overdose). In case of emergency (overdose), inject into muscle of upper arm or leg and call 911. 2 mL 1  . NON FORMULARY IT pump  Fentanyl 1,500.0 mcg/ml Bupivicaine 30.0 mg/ml Clonidine 300.0 mcg/ml Rate 500.4 mg/day    . omeprazole (PRILOSEC) 20 MG capsule Take 20 mg by mouth daily.     . ondansetron (ZOFRAN) 8 MG tablet Take 1 tablet (8 mg total) by mouth as directed. Take with chemo and can take it as needed 20 tablet 1  . potassium chloride SA (KLOR-CON) 20 MEQ tablet TAKE 1 TABLET(20 MEQ) BY MOUTH DAILY (Patient not taking: Reported on 12/27/2020) 90 tablet 0  . tamsulosin (FLOMAX) 0.4 MG CAPS capsule TAKE 1 CAPSULE BY MOUTH DAILY 90 capsule 2  . valACYclovir (VALTREX) 500 MG tablet TAKE 1 TABLET DAILY 90 tablet 3  .  vitamin B-12 (CYANOCOBALAMIN) 1000 MCG tablet Take 1,000 mcg daily by mouth.      No current facility-administered medications for this visit.    Review of Systems  Constitutional: Positive for malaise/fatigue. Negative for chills, fever and weight loss.  HENT: Positive for hearing loss. Negative for congestion, ear pain and tinnitus.   Eyes: Negative.  Negative for blurred vision and double vision.  Respiratory: Negative.  Negative for cough, sputum production and shortness of breath.   Cardiovascular: Negative.  Negative for chest pain, palpitations and leg swelling.  Gastrointestinal: Negative.  Negative for abdominal pain, constipation, diarrhea, nausea and vomiting.  Genitourinary: Negative for dysuria, frequency and urgency.  Musculoskeletal: Positive for back pain. Negative for falls.  Skin: Negative.  Negative for rash.  Neurological: Negative.  Negative for weakness and headaches.  Endo/Heme/Allergies: Negative.  Does not bruise/bleed easily.  Psychiatric/Behavioral: Negative.  Negative for depression. The patient is not nervous/anxious and does not have insomnia.    Performance status (ECOG): 1  Vitals There were no vitals taken for this visit.   Physical Exam Constitutional:      Appearance: He is well-developed.  HENT:     Head: Normocephalic and atraumatic.  Eyes:     Pupils: Pupils are equal, round, and reactive to light.  Cardiovascular:     Rate and Rhythm: Normal rate and regular rhythm.     Heart sounds: No murmur heard.   Pulmonary:     Effort: Pulmonary effort is normal.     Breath sounds: Normal breath sounds. No wheezing.  Abdominal:     General: Bowel sounds are normal. There is no distension.     Palpations: Abdomen is soft. There is no mass.     Tenderness: There is no abdominal tenderness.  Musculoskeletal:        General: Normal range of motion.  Cervical back: Normal range of motion.  Skin:    General: Skin is warm and dry.  Neurological:      Mental Status: He is alert and oriented to person, place, and time.  Psychiatric:        Behavior: Behavior normal.    No visits with results within 3 Day(s) from this visit.  Latest known visit with results is:  Infusion on 12/27/2020  Component Date Value Ref Range Status  . Kappa free light chain 12/27/2020 5.6  3.3 - 19.4 mg/L Final  . Lamda free light chains 12/27/2020 4.4* 5.7 - 26.3 mg/L Final  . Kappa, lamda light chain ratio 12/27/2020 1.27  0.26 - 1.65 Final   Comment: (NOTE) Performed At: [facility] [place], [place] [id number] Rush Farmer MD [id number]   . Total Protein ELP 12/27/2020 6.1  6.0 - 8.5 g/dL Final  . Albumin ELP 12/27/2020 3.5  2.9 - 4.4 g/dL Final  . Alpha-1-Globulin 12/27/2020 0.2  0.0 - 0.4 g/dL Final  . Alpha-2-Globulin 12/27/2020 0.9  0.4 - 1.0 g/dL Final  . Beta Globulin 12/27/2020 0.8  0.7 - 1.3 g/dL Final  . Gamma Globulin 12/27/2020 0.7  0.4 - 1.8 g/dL Final  . M-Spike, % 12/27/2020 0.5* Not Observed g/dL Final  . SPE Interp. 12/27/2020 Comment   Final   Comment: (NOTE) The SPE pattern demonstrates a single peak (M-spike) in the gamma region which may represent monoclonal protein. This peak may also be caused by circulating immune complexes, cryoglobulins, C-reactive protein, fibrinogen or hemolysis.  If clinically indicated, the presence of a monoclonal gammopathy may be confirmed by immuno- fixation, as well as an evaluation of the urine for the presence of Bence-Jones protein. Performed At: [facility] [place], [place] [id number] Rush Farmer MD [id number]   . Comment 12/27/2020 Comment   Final   Comment: (NOTE) Protein electrophoresis scan will follow via computer, mail, or courier delivery.   . Globulin, Total 12/27/2020 2.6  2.2 - 3.9 g/dL Corrected  . A/G Ratio 12/27/2020 1.3  0.7 - 1.7 Corrected  . Magnesium 12/27/2020 1.9  1.7 - 2.4 mg/dL Final   Performed at [facility], [address], [place], [place] [address]  . Sodium 12/27/2020 136  135 - 145 mmol/L Final  . Potassium 12/27/2020 3.8  3.5 - 5.1 mmol/L Final  . Chloride 12/27/2020 104  98 - 111 mmol/L Final  . CO2 12/27/2020 22  22 - 32 mmol/L Final  . Glucose, Bld 12/27/2020 161* 70 - 99 mg/dL Final   Glucose reference range applies only to samples taken after fasting for at least 8 hours.  . BUN 12/27/2020 15  8 - 23 mg/dL Final  . Creatinine, Ser 12/27/2020 1.10  0.61 - 1.24 mg/dL Final  . Calcium 12/27/2020 8.8* 8.9 - 10.3 mg/dL Final  . Total Protein 12/27/2020 6.1* 6.5 - 8.1 g/dL Final  . Albumin 12/27/2020 4.0  3.5 - 5.0 g/dL Final  . AST 12/27/2020 21  15 - 41 U/L Final  . ALT 12/27/2020 12  0 - 44 U/L Final  . Alkaline Phosphatase 12/27/2020 42  38 - 126 U/L Final  . Total Bilirubin 12/27/2020 0.8  0.3 - 1.2 mg/dL Final  . GFR, Estimated 12/27/2020 >60  >60 mL/min Final   Comment: (NOTE) Calculated using the CKD-EPI Creatinine Equation (2021)   . Anion gap 12/27/2020 10  5 - 15 Final   Performed at [facility], [address], [place], [place] [address]  . WBC 12/27/2020 5.9  4.0 - 10.5 K/uL Final  . RBC 12/27/2020 3.56* 4.22 - 5.81 MIL/uL Final  . Hemoglobin 12/27/2020 12.3* 13.0 - 17.0 g/dL Final  . HCT 12/27/2020 35.6* 39.0 - 52.0 % Final  . MCV 12/27/2020 100.0  80.0 - 100.0 fL Final  . MCH 12/27/2020 34.6* 26.0 - 34.0 pg Final  . MCHC 12/27/2020 34.6  30.0 - 36.0 g/dL Final  . RDW 12/27/2020 13.8  11.5 - 15.5 % Final  . Platelets 12/27/2020 191  150 - 400 K/uL Final  . nRBC 12/27/2020 0.0  0.0 - 0.2 % Final  . Neutrophils Relative % 12/27/2020 70  % Final  . Neutro Abs 12/27/2020 4.1  1.7 - 7.7 K/uL Final  . Lymphocytes Relative 12/27/2020 23  % Final  . Lymphs Abs 12/27/2020 1.4  0.7 - 4.0 K/uL Final  . Monocytes Relative 12/27/2020 4  % Final  . Monocytes Absolute 12/27/2020 0.2  0.1 - 1.0 K/uL Final  . Eosinophils Relative 12/27/2020 2  %  Final  . Eosinophils Absolute 12/27/2020 0.1  0.0 - 0.5 K/uL Final  . Basophils Relative 12/27/2020 1  % Final  . Basophils Absolute 12/27/2020 0.0  0.0 - 0.1 K/uL Final  . Immature Granulocytes 12/27/2020 0  % Final  . Abs Immature Granulocytes 12/27/2020 0.02  0.00 - 0.07 K/uL Final   Performed at [facility], [address] [place], [place] [address]    Assessment:  Logan Perez is a 75 y.o. male with stage III mutiple myeloma. He initially presented with progressive back pain beginning in 12/2006. MRI revealed "spots and compression fractures". He began Velcade, thalidomide, and Decadron. In 08/2007, he underwent high dose chemotherapy and autologous stem cell transplant. 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  He recurred with a rising M-spike (2.7) with repeat M spike (1.7 gm/dl) in 03/2010. He was initially treated with Velcade (02/08/2010 - 05/10/2010). He then began Revlimid (15 mg 3 weeks on/1 week off) and Decadron (40 mg on day 1, 8, 15, 22). Because of significant side effect with Decadron his dose was decreased to 10 mg once a week in 07/2010.   He was on maintenance Revlimid. Revlimid was initially10 mg 3 weeks on/1 week off. This was changed to 10 mg 2 weeks on/2 weeks off secondary to right nipple tenderness. His dose was increased to 10 mg 3 weeks on/1 week off with Decadron 10 mg a week (on Sundays) and then Revlamid 15 mg 3 weeks on and 1 week off with Decadron on Sundays. He began Pomalyst4 mg 3 weeks on/1 week off with Decadron on 08/27/2015.  SPEPrevealed no monoclonal protein (04/21/2015) and 0.5 gm/dL on 09/22/2015 and 10/20/2015. M spikewas 0.1 on 02/02/2016, 03/01/2016, 03/29/2016, 04/26/2016, and 05/24/2016. M spikewas 0 on 10/11/2016, 11/28/2016, 02/05/2017, 03/21/2017, 08/02/2017, 10/04/2017, 12/27/2017, 01/24/2018, and 02/21/2018. M spikewas 0.1 on 11/01/2017, 0.1 on 11/29/2017, 0 on 12/27/2017, 0  on 01/24/2018, 0 on 02/21/2018, 0.1 on 03/21/2018, 0.3 on 04/18/2018, 0.1 on 05/16/2018, 0 on 06/13/2018, 0.2 on 08/08/2018, 0 on 09/09/2018, 0.1 on 11/04/2018, 0 on 12/02/2018, 0.1 on 12/30/2018, 0 on 01/27/2019, 0.1 on 02/24/2019, 0.1 on 03/24/2019, 0.1 on 04/23/2019, 0 on 05/19/2019, 0.1 on 06/17/2019, 0.2 on 07/15/2019, 0.2 on 08/18/2019, 0.2 on 09/15/2019, 0.1 on 10/13/2019, 0.1 on 11/13/2019, 0 on 12/11/2019, 0.3 on 01/08/2020, 0.2 on 02/05/2020, 0.2 on 03/04/2020, 0.2 on 04/01/2020, 0 on 05/05/2020, 0.4 on  06/07/2020, 0.4 on 07/06/2020, 0.3 on 08/03/2020, 0.1 on 08/31/2020, 0.3 on 09/28/2020, and 0.2 on 10/26/2020.  Free light chainshave been monitored. Kappa free light chainswere 18.54 on 11/21/2013, 18.37 on 02/20/2014, 18.93 on 05/22/2014, 32.58 (high; normal ratio 1.27) on 08/21/2014, 51.53 (high; elevated ratio of 2.12) on 11/13/2014, 28.08 (ratio 1.73) on 12/09/2014, 23.71 (ratio 2.17) on 01/18/2015, 92.93 (ratio 9.49) on 04/21/2015, 93.44 (ratio 10.28) on 05/26/2015, 255.45 (ratio of 24.05) on 07/14/2015, 373.89 (ratio 48.31) on 08/04/2015, 474.33 (ratio 70.58) on 08/25/2015, 450.76 (ratio 55.44) on 09/22/2015, 453.4 (ratio 59.89) on 10/20/2015, 58.26 (ratio of >40.74) on 02/02/2016, 62.67 (ratio of 37.98) on 03/01/2016, 75.7 (ratio >50.47) on 03/29/2016, 79.7 (ratio 53.13) on 04/26/2016, 108.6 (ratio >72.4) on 05/24/2016, 8.3 (1.46 ratio) on 10/11/2016, 6.7 (0.81 ratio) on 02/05/2017, 7.8 (1.01 ratio) on 03/21/2017,7.6 (ratio 0.63) on 06/01/2017, 8.1 (ratio 1.13) on 11/29/2017, 9.6 (ratio 1.55) on 06/13/2018, 6.6 (ratio 2.06) on 07/11/2018, 6.9 (ratio 0.82) on 08/08/2018, 5.8 (ratio 1.66) on 09/09/2018, 5.8 (ratio 1.05) on 11/04/2018, 5.6(ratio 2.55)on 12/02/2018, 5.5 (ratio 1.96) on 01/27/2019, 7.6 (ratio 2.38) on 06/17/2019, 6.9 (ratio 1.21) on 11/13/2019, 6.6 (ratio 2.0) on 12/11/2019, 7.9 (ratio 2.14) on 02/05/2020, 6.6 (ratio 2.28) on 04/01/2020, 7.0 (ratio 2.12) on 05/05/2020, 9.3  (ratio 2.16) on 07/06/2020,  7.1 (ratio 2.09) on 08/31/2020, and 6.9 (ratio 2.09) on 10/26/2020.  Bone surveyon 12/08/2014 was stable. Bone survey on 10/21/2015 revealed increase conspicuity of subcentimeter lytic lesions in the calvarium.  PET scan on 08/14/2018 revealed no evidence of active myeloma.  Bone survey on 11/04/2019 revealed stable small lytic lesions throughout the skull.  There were no other lytic lesions.  Bone marrow aspirate and biopsyon 11/04/2015 revealed an atypical monoclonal plasma cells estimated at 30-40% of marrow cells. Marrow was variably cellular (approximately 45%) with background trilineage hematopoiesis. There was no significant increase in marrow reticulin fibers. Storage iron was present.   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 He develoed herpes zoster in 04/2008. He developed a pulmonary embolismin 05/2013. He was initially on Xarelto, but is now on Eliquis. He had an episode of pneumonia around this time requiring a brief admission. He developed severe lower leg cramps on 08/07/2014 secondary to hypokalemia. Duplex was negative.   He was treated forC difficile colitis(Flagyl completed 07/30/2015). He has a chronic indwelling pain pump.  He received 4 cycles of Pomalyst and Decadron(08/27/2015 - 11/19/2015). Restaging studies document progressive disease. Kappa free light chains are increasing. SPEP revealed 0.5 gm/dL monoclonal protein then 1.3 gm/dL. Bone survey reveals increase conspicuity of subcentimeter lytic lesions in the calvarium. Bone marrow reveals 30-40% plasma cells.   MUGAon 11/30/2015 revealed an ejection fraction of 46%. He is felt not to be a good candidate for Kyprolis. There were no focal wall motion abnormalities. He had a stress echo less than 1 year ago. He has a history of PVCs and atrial fibrillation. He takes Cardizem prn.  He received 17 weeks of  daratumumab(Darzalex) (12/09/2015 - 05/25/2016). He tolerated treatment well without side effect.  Bone marrow on 02/09/2016 revealed no diagnostic morphologic evidence of plasma cell myeloma. Marrow was normocellular to hypocellular marrow for age (ranging from 10-40%) with maturing trilineage hematopoiesis and mild multilineage dyspoiesis. There was patchy mild increase in reticulin. Storage iron was present. Flow cytometry revealed no definitive evidence of monoclonality. There was a non-specific atypical myeloid and monocytic findings with no increase in blasts. Cytogenetics were normal (46, XY).  He is currently54 months s/p 2nd  autologous stem cell transplanton 06/16/2016. 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 He also experienced atrial fibrillation with intermittent episodes of RVR requiring IV beta blockers. He is on prophylactic valacyclovir for 1 year post transplant.  He started vaccinations(DTaP-Pediatric triple vaccine, Hep B- Pediatrix triple vaccine, Haemophilus influenza B (Hib), inactivated polio virus (IPV), pneumococcal conjugate vaccine 13-valent (PCV 13)) on 04/17/2017. Recommendation was for Shingrix vaccine to be given in [place] and second dose of Shingrix in 2 months. He had follow-up vaccinations on 08/28/2017. He had his second shingles vaccine. He received 18 month vaccinations- DTaP-Pediatric triple vaccine, Hep B- Pediatrix triple vaccine, Haemophilus influenza B (Hib), inactivated polio virus (IPV), pneumococcal conjugate vaccine 13-valent (PCV 13) on 02/05/2018.  He is s/p52 cycles ofdaratumumab(Darzalex) post transplant(12/05/2016 -12/27/20).  PET scanon 08/14/2018 revealed no evidence of active myeloma on whole-body FDG PET scan. There was no evidence soft tissue plasmacytoma. There was no clear evidence of lytic lesions on the CT portion exam. Some lucencies in the pelvis were  stable. There were chronic compression fractures in the lower thoracic spine.  Bone survey on 11/04/2019 revealed stable small lytic lesions noted throughout the skull.  There were no other lytic lesions. Exam was stable.  Bone survey on 11/10/2020 revealed no focal lytic lesions.  There are stable compression fractures of T5, T10 and T12.   He has chronic diarrhea. GI panel by PCR was + enteropathogenic E coli (EPEC) on 11/30/2020 and 12/13/2020.  Stool was + for adenovirus on 11/30/2020.  Stool was negative for EPEC on 12/22/2020.  He was treated with ciprofloxacin (last 12/21/2020).  He has a history of hypomagnesemiathat required IV magnesium (2-4 gm) weekly. He has not required magnesium since 10/24/2016. Patient has atrial fibrillationand is on Eliquis.  Symptomatically, he feels "well." His bowels have returned to normal.   Plan: 1.   Labs today: CBC with diff, CMP, Mg, SPEP, FLCA. 2.Multiple myeloma -Clinically, he is doing well. -M spike fluctuates between 0.1 - 0.5 gm/dL in the past year.  -M spike from today is pending.  -PET scan on 08/14/2018 revealed no evidence of active myeloma. -Bone survey on 11/10/2020 revealed no focal lytic lesions.             -There were stable compression fractions of T5, T10 and T12. -Labs reviewed.  Cycle #52 daratumumab today.    -He continues premedications taken at home: Tylenol, Benadryl, ondansetron, Singulair and Decadron.    -Takes Decadron 24 mg a month (discuss potential for decreasing dose).  -Consider switch to daratumumab SQ. -Continue visits every other month unless aggressive trend in M spike. -Continue prophylactic valacyclovir (until 3 months s/pdaratumumab). -He is no longer followed at the transplant center. -Follow-up with Dr. Adriana Simas via phone periodically. -Discuss symptom management.  He has antiemetics at home to use on a prn bases.  Interventions are adequate.          3. Macrocytic  anemia -Labs from today show a hematocrit of 37.5 and hemoglobin 13.1.     -B12 and folate were normal on 12/11/2019.   -Check B12 and folate annually. 4.Neuropathy -Neuropathy main stable. -He remains on baclofen. 5.Chronic diarrhea -Diarrhea is back to baseline if not completely resolved. -He has had interval E coli (EPEC) and adenovirus. -Daratumumab side effects: 16% diarrhea -Continue to monitor. 6.History of pulmonary embolism(2014) -Continue Eliquis.  -He denies any bruising or bleeding. 7.    CKD:  -Creatinine appears to fluctuate (1.17-1.59) since 2018.   -  He receives IVF as needed to help with his hydration status.  -Labs from 01/24/2021 show a creatinine 1.52.   -Recommend 1 liter NaCl with treatment today.   Disposition: -Proceed with cycle #52 daratumumab today and 1 liter NaCl.  -RTC in 4 weeks for labs (CBC with diff, CMP, Mg, SPEP, FLCA) and cycle # 52 daratumumab and 8 weeks for labs (CBC with diff, CMP, Mg, SPEP, FLCA), MD assessment and cycle # 53 daratumumab.  Greater than 50% was spent in counseling and coordination of care with this patient including but not limited to discussion of the relevant topics above (See A&P) including, but not limited to diagnosis and management of acute and chronic medical conditions.   Faythe Casa, NP 01/24/2021 8:55 AM

## 2021-01-24 ENCOUNTER — Inpatient Hospital Stay: Payer: Medicare Other | Attending: Hematology and Oncology

## 2021-01-24 ENCOUNTER — Other Ambulatory Visit: Payer: Medicare Other

## 2021-01-24 ENCOUNTER — Encounter: Payer: Self-pay | Admitting: Oncology

## 2021-01-24 ENCOUNTER — Ambulatory Visit: Payer: Medicare Other

## 2021-01-24 ENCOUNTER — Inpatient Hospital Stay: Payer: Medicare Other

## 2021-01-24 ENCOUNTER — Other Ambulatory Visit: Payer: Self-pay | Admitting: Hematology and Oncology

## 2021-01-24 ENCOUNTER — Inpatient Hospital Stay (HOSPITAL_BASED_OUTPATIENT_CLINIC_OR_DEPARTMENT_OTHER): Payer: Medicare Other | Admitting: Oncology

## 2021-01-24 VITALS — BP 131/79 | HR 84 | Temp 96.3°F | Resp 18 | Wt 162.8 lb

## 2021-01-24 DIAGNOSIS — C9000 Multiple myeloma not having achieved remission: Secondary | ICD-10-CM | POA: Diagnosis not present

## 2021-01-24 DIAGNOSIS — Z5112 Encounter for antineoplastic immunotherapy: Secondary | ICD-10-CM | POA: Diagnosis not present

## 2021-01-24 DIAGNOSIS — Z79899 Other long term (current) drug therapy: Secondary | ICD-10-CM | POA: Diagnosis not present

## 2021-01-24 DIAGNOSIS — D649 Anemia, unspecified: Secondary | ICD-10-CM

## 2021-01-24 DIAGNOSIS — E860 Dehydration: Secondary | ICD-10-CM

## 2021-01-24 DIAGNOSIS — G893 Neoplasm related pain (acute) (chronic): Secondary | ICD-10-CM | POA: Diagnosis not present

## 2021-01-24 DIAGNOSIS — Z87891 Personal history of nicotine dependence: Secondary | ICD-10-CM | POA: Diagnosis not present

## 2021-01-24 DIAGNOSIS — Z9484 Stem cells transplant status: Secondary | ICD-10-CM

## 2021-01-24 DIAGNOSIS — C9002 Multiple myeloma in relapse: Secondary | ICD-10-CM | POA: Diagnosis not present

## 2021-01-24 LAB — CBC WITH DIFFERENTIAL/PLATELET
Abs Immature Granulocytes: 0.03 10*3/uL (ref 0.00–0.07)
Basophils Absolute: 0 10*3/uL (ref 0.0–0.1)
Basophils Relative: 0 %
Eosinophils Absolute: 0.2 10*3/uL (ref 0.0–0.5)
Eosinophils Relative: 2 %
HCT: 37.5 % — ABNORMAL LOW (ref 39.0–52.0)
Hemoglobin: 13.1 g/dL (ref 13.0–17.0)
Immature Granulocytes: 0 %
Lymphocytes Relative: 20 %
Lymphs Abs: 1.8 10*3/uL (ref 0.7–4.0)
MCH: 35.1 pg — ABNORMAL HIGH (ref 26.0–34.0)
MCHC: 34.9 g/dL (ref 30.0–36.0)
MCV: 100.5 fL — ABNORMAL HIGH (ref 80.0–100.0)
Monocytes Absolute: 0.5 10*3/uL (ref 0.1–1.0)
Monocytes Relative: 6 %
Neutro Abs: 6.6 10*3/uL (ref 1.7–7.7)
Neutrophils Relative %: 72 %
Platelets: 228 10*3/uL (ref 150–400)
RBC: 3.73 MIL/uL — ABNORMAL LOW (ref 4.22–5.81)
RDW: 13.1 % (ref 11.5–15.5)
WBC: 9.2 10*3/uL (ref 4.0–10.5)
nRBC: 0 % (ref 0.0–0.2)

## 2021-01-24 LAB — COMPREHENSIVE METABOLIC PANEL WITH GFR
ALT: 13 U/L (ref 0–44)
AST: 28 U/L (ref 15–41)
Albumin: 3.9 g/dL (ref 3.5–5.0)
Alkaline Phosphatase: 46 U/L (ref 38–126)
Anion gap: 11 (ref 5–15)
BUN: 16 mg/dL (ref 8–23)
CO2: 22 mmol/L (ref 22–32)
Calcium: 8.9 mg/dL (ref 8.9–10.3)
Chloride: 105 mmol/L (ref 98–111)
Creatinine, Ser: 1.52 mg/dL — ABNORMAL HIGH (ref 0.61–1.24)
GFR, Estimated: 48 mL/min — ABNORMAL LOW
Glucose, Bld: 161 mg/dL — ABNORMAL HIGH (ref 70–99)
Potassium: 3.6 mmol/L (ref 3.5–5.1)
Sodium: 138 mmol/L (ref 135–145)
Total Bilirubin: 0.8 mg/dL (ref 0.3–1.2)
Total Protein: 6.2 g/dL — ABNORMAL LOW (ref 6.5–8.1)

## 2021-01-24 LAB — MAGNESIUM: Magnesium: 1.7 mg/dL (ref 1.7–2.4)

## 2021-01-24 MED ORDER — HEPARIN SOD (PORK) LOCK FLUSH 100 UNIT/ML IV SOLN
500.0000 [IU] | Freq: Once | INTRAVENOUS | Status: AC
  Administered 2021-01-24: 500 [IU] via INTRAVENOUS
  Filled 2021-01-24: qty 5

## 2021-01-24 MED ORDER — SODIUM CHLORIDE 0.9% FLUSH
10.0000 mL | Freq: Once | INTRAVENOUS | Status: AC
Start: 2021-01-24 — End: 2021-01-24
  Administered 2021-01-24: 10 mL via INTRAVENOUS
  Filled 2021-01-24: qty 10

## 2021-01-24 MED ORDER — HEPARIN SOD (PORK) LOCK FLUSH 100 UNIT/ML IV SOLN
INTRAVENOUS | Status: AC
  Filled 2021-01-24: qty 5

## 2021-01-24 MED ORDER — SODIUM CHLORIDE 0.9 % IV SOLN
Freq: Once | INTRAVENOUS | Status: AC
  Filled 2021-01-24: qty 250

## 2021-01-24 MED ORDER — SODIUM CHLORIDE 0.9 % IV SOLN
16.0000 mg/kg | Freq: Once | INTRAVENOUS | Status: AC
  Administered 2021-01-24: 1200 mg via INTRAVENOUS
  Filled 2021-01-24: qty 60

## 2021-01-24 MED ORDER — SODIUM CHLORIDE 0.9 % IV SOLN
INTRAVENOUS | Status: DC
  Filled 2021-01-24 (×2): qty 250

## 2021-01-24 NOTE — Progress Notes (Signed)
Patient here for oncology [facility] follow-up appointment, expresses no new concerns at this time.

## 2021-01-25 LAB — PROTEIN ELECTROPHORESIS, SERUM
A/G Ratio: 1.8 — ABNORMAL HIGH (ref 0.7–1.7)
Albumin ELP: 3.5 g/dL (ref 2.9–4.4)
Alpha-1-Globulin: 0.1 g/dL (ref 0.0–0.4)
Alpha-2-Globulin: 0.8 g/dL (ref 0.4–1.0)
Beta Globulin: 0.8 g/dL (ref 0.7–1.3)
Gamma Globulin: 0.2 g/dL — ABNORMAL LOW (ref 0.4–1.8)
Globulin, Total: 1.9 g/dL — ABNORMAL LOW (ref 2.2–3.9)
Total Protein ELP: 5.4 g/dL — ABNORMAL LOW (ref 6.0–8.5)

## 2021-01-25 LAB — KAPPA/LAMBDA LIGHT CHAINS
Kappa free light chain: 7.8 mg/L (ref 3.3–19.4)
Kappa, lambda light chain ratio: 1.86 — ABNORMAL HIGH (ref 0.26–1.65)
Lambda free light chains: 4.2 mg/L — ABNORMAL LOW (ref 5.7–26.3)

## 2021-02-01 ENCOUNTER — Telehealth: Payer: Self-pay

## 2021-02-01 NOTE — Telephone Encounter (Signed)
Nutrition Follow-up:  Patient with multiple myeloma s/p autologous stem cell transplant and relapse, receiving daratumumab.    Called patient for nutrition follow-up.  Patient reports diarrhea has improved, having stools maybe 2 times per day.  Feels better and eating better.      Medications: reviewed  Labs: reviewed  Anthropometrics:   Weight 162 lb 12.8 oz on 4/18  Decreased from 167 lb 1/25.  Patient reports this is most he has weighed.  Stable recently   NUTRITION DIAGNOSIS: Food and nutrition related knowledge deficit improved   INTERVENTION:  Patient verbalized that he is eating better and denies needing any nutrition assistance at this time.  Appreciative of phone call.  RD available as needed   NEXT VISIT: no follow-up   Juanisha Bautch B. Zenia Resides, [place], [place] Registered Dietitian [phone] (mobile)

## 2021-02-08 ENCOUNTER — Encounter: Payer: Self-pay | Admitting: Pain Medicine

## 2021-02-08 ENCOUNTER — Ambulatory Visit: Payer: Medicare Other | Attending: Pain Medicine | Admitting: Pain Medicine

## 2021-02-08 ENCOUNTER — Other Ambulatory Visit: Payer: Self-pay

## 2021-02-08 VITALS — BP 139/81 | HR 54 | Temp 97.4°F | Resp 16 | Ht 67.0 in | Wt 163.0 lb

## 2021-02-08 DIAGNOSIS — X58XXXA Exposure to other specified factors, initial encounter: Secondary | ICD-10-CM | POA: Insufficient documentation

## 2021-02-08 DIAGNOSIS — M4854XA Collapsed vertebra, not elsewhere classified, thoracic region, initial encounter for fracture: Secondary | ICD-10-CM | POA: Diagnosis not present

## 2021-02-08 DIAGNOSIS — Z451 Encounter for adjustment and management of infusion pump: Secondary | ICD-10-CM

## 2021-02-08 DIAGNOSIS — S22080S Wedge compression fracture of T11-T12 vertebra, sequela: Secondary | ICD-10-CM

## 2021-02-08 DIAGNOSIS — G893 Neoplasm related pain (acute) (chronic): Secondary | ICD-10-CM | POA: Insufficient documentation

## 2021-02-08 DIAGNOSIS — Z978 Presence of other specified devices: Secondary | ICD-10-CM

## 2021-02-08 DIAGNOSIS — Z9689 Presence of other specified functional implants: Secondary | ICD-10-CM | POA: Insufficient documentation

## 2021-02-08 DIAGNOSIS — G894 Chronic pain syndrome: Secondary | ICD-10-CM | POA: Insufficient documentation

## 2021-02-08 DIAGNOSIS — C9001 Multiple myeloma in remission: Secondary | ICD-10-CM | POA: Insufficient documentation

## 2021-02-08 DIAGNOSIS — Z95828 Presence of other vascular implants and grafts: Secondary | ICD-10-CM

## 2021-02-08 DIAGNOSIS — Z79899 Other long term (current) drug therapy: Secondary | ICD-10-CM

## 2021-02-08 DIAGNOSIS — M545 Low back pain, unspecified: Secondary | ICD-10-CM

## 2021-02-08 DIAGNOSIS — G8929 Other chronic pain: Secondary | ICD-10-CM

## 2021-02-08 NOTE — Progress Notes (Signed)
Safety precautions to be maintained throughout the outpatient stay will include: orient to surroundings, keep bed in low position, maintain call bell within reach at all times, provide assistance with transfer out of bed and ambulation.  

## 2021-02-08 NOTE — Progress Notes (Signed)
PROVIDER NOTE: Information contained herein reflects review and annotations entered in association with encounter. Interpretation of such information and data should be left to medically-trained personnel. Information provided to patient can be located elsewhere in the medical record under "Patient Instructions". Document created using STT-dictation technology, any transcriptional errors that may result from process are unintentional.    Patient: Logan Perez  Service Category: Procedure  Provider: Gaspar Cola, MD  DOB: [birth year]  DOS: 02/08/2021  Location: [facility]  MRN: [id number]  Setting: Ambulatory - outpatient  Referring Provider: Sofie Hartigan, MD  Type: Established Patient  Specialty: Interventional Pain Management  PCP: Lequita Asal, MD   Primary Reason for Visit: Interventional Pain Management Treatment. CC: Back Pain  Procedure:          Intrathecal Drug Delivery System (IDDS):  Type: Reservoir Refill [phone]) No rate change Region: Abdominal Laterality: Right  Type of Pump: Medtronic Synchromed II Delivery Route: Intrathecal Type of Pain Treated: Neuropathic/Nociceptive Primary Medication Class: Opioid/opiate  Medication, Concentration, Infusion Program, & Delivery Rate: Please see scanned programming printout.   Indications: 1. Chronic pain syndrome   2. 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 Cancer associated pain   4. Compression fracture of T12 vertebra (HCC) (70-75% magnitude) (with mild retropulsion)   5. Chronic low back pain (Bilateral) w/o sciatica   6. Presence of intrathecal pump   7. Presence of implanted infusion pump (Medtronic, programmable, intrathecal pump)   8. Encounter for adjustment or management of infusion pump   9. Pharmacologic therapy    Pain Assessment: Self-Reported Pain Score: 3 /10             Reported level is compatible with observation.        Pharmacotherapy Assessment  Analgesic:  Intrathecal PF-Fentanyl 502.2 mcg/day (20.9 mcg/hr) MME/day: 50.16 mg/day.   Monitoring: [place] PMP: PDMP reviewed during this encounter.       Pharmacotherapy: No side-effects or adverse reactions reported. Compliance: No problems identified. Effectiveness: Clinically acceptable. Plan: Refer to "POC".  UDS: No results found for: SUMMARY  Intrathecal Pump Therapy Assessment  Manufacturer: Medtronic Synchromed Type: Programmable Volume: 40 mL reservoir MRI compatibility: Yes   Drug content:  Primary Medication Class:Opioid Primary Medication:PF-Fentanyl(1500 mcg/mL) Secondary Medication:PF-Bupivacaine(30 mg/mL) Other Medication:PF-Clonidine(300 mcg/mL)   Programming:  Type: Simple continuous. See pump readout for details.   Changes:  Medication Change: None at this point Rate Change: No change in rate  Reported side-effects or adverse reactions: None reported  Effectiveness: Described as relatively effective, allowing for increase in activities of daily living (ADL) Clinically meaningful improvement in function (CMIF): Sustained CMIF goals met  Plan: Pump refill today  Pre-op H&P Assessment:  [name] is a 75 y.o. (year old), male patient, seen today for interventional treatment. He  has a past surgical history that includes Knee arthroscopy (Left, 1992); Pain pump implantation (2012); stem cell implant (2008); Cataract extraction w/PHACO (Right, 05/02/2016); Cataract extraction w/PHACO (Left, 05/16/2016); Limbal stem cell transplant; PORTA CATH INSERTION (N/A, 12/04/2016); Colonoscopy with propofol (N/A, 03/22/2017); Esophagogastroduodenoscopy (egd) with propofol (03/22/2017); Eye surgery; Back surgery (1994); and Pain pump implantation (N/A, 09/04/2017). Mr. Gabbard has a current medication list which includes the following prescription(s): acetaminophen, bismuth subsalicylate, calcium citrate-vitamin d, cetirizine, vitamin d3, daratumumab, dexamethasone, diltiazem,  diltiazem, diphenhydramine, eliquis, fluticasone, carboxymethylcellulose sodium, loperamide, loratadine, lovastatin, montelukast, multiple vitamins-iron, naloxone, NON FORMULARY, omeprazole, ondansetron, PAIN MANAGEMENT INTRATHECAL, IT, PUMP, tamsulosin, valacyclovir, vitamin b-12, baclofen, ciprofloxacin, potassium chloride sa, and [DISCONTINUED] PAIN MANAGEMENT  IT PUMP REFILL. His primarily concern today is the Back Pain  Initial Vital Signs:  Pulse/HCG Rate: (!) 54  Temp: (!) 97.4 F (36.3 C) Resp: 16 BP: 139/81 SpO2: 99 %  BMI: Estimated body mass index is 25.53 kg/m as calculated from the following:   Height as of this encounter: '5\' 7"'  (1.702 m).   Weight as of this encounter: 163 lb (73.9 kg).  Risk Assessment: Allergies: Reviewed. He is allergic to ciprofloxacin, azithromycin, bee pollen, pollen extract, zoledronic acid, and rivaroxaban.  Allergy Precautions: None required Coagulopathies: Reviewed. None identified.  Blood-thinner therapy: None at this time Active Infection(s): Reviewed. None identified. Mr. Brickle is afebrile  Site Confirmation: Mr. Hudler was asked to confirm the procedure and laterality before marking the site Procedure checklist: Completed Consent: Before the procedure and under the influence of no sedative(s), amnesic(s), or anxiolytics, the patient was informed of the treatment options, risks and possible complications. To fulfill our ethical and legal obligations, as recommended by the American Medical Association's Code of Ethics, I have informed the patient of my clinical impression; the nature and purpose of the treatment or procedure; the risks, benefits, and possible complications of the intervention; the alternatives, including doing nothing; the risk(s) and benefit(s) of the alternative treatment(s) or procedure(s); and the risk(s) and benefit(s) of doing nothing.  Mr. Duerson was provided with information about the general risks and possible  complications associated with most interventional procedures. These include, but are not limited to: failure to achieve desired goals, infection, bleeding, organ or nerve damage, allergic reactions, paralysis, and/or death.  In addition, he was informed of those risks and possible complications associated to this particular procedure, which include, but are not limited to: damage to the implant; failure to decrease pain; local, systemic, or serious CNS infections, intraspinal abscess with possible cord compression and paralysis, or life-threatening such as meningitis; bleeding; organ damage; nerve injury or damage with subsequent sensory, motor, and/or autonomic system dysfunction, resulting in transient or permanent pain, numbness, and/or weakness of one or several areas of the body; allergic reactions, either minor or major life-threatening, such as anaphylactic or anaphylactoid reactions.  Furthermore, Mr. Moring was informed of those risks and complications associated with the medications. These include, but are not limited to: allergic reactions (i.e.: anaphylactic or anaphylactoid reactions); endorphine suppression; bradycardia and/or hypotension; water retention and/or peripheral vascular relaxation leading to lower extremity edema and possible stasis ulcers; respiratory depression and/or shortness of breath; decreased metabolic rate leading to weight gain; swelling or edema; medication-induced neural toxicity; particulate matter embolism and blood vessel occlusion with resultant organ, and/or nervous system infarction; and/or intrathecal granuloma formation with possible spinal cord compression and permanent paralysis.  Before refilling the pump Mr. No was informed that some of the medications used in the devise may not be FDA approved for such use and therefore it constitutes an off-label use of the medications.  Finally, he was informed that Medicine is not an exact science; therefore, there  is also the possibility of unforeseen or unpredictable risks and/or possible complications that may result in a catastrophic outcome. The patient indicated having understood very clearly. We have given the patient no guarantees and we have made no promises. Enough time was given to the patient to ask questions, all of which were answered to the patient's satisfaction. Mr. Wing has indicated that he wanted to continue with the procedure. Attestation: I, the ordering provider, attest that I have discussed with the patient the benefits, risks, side-effects, alternatives, likelihood  of achieving goals, and potential problems during recovery for the procedure that I have provided informed consent. Date  Time: 02/08/2021  1:09 PM  Pre-Procedure Preparation:  Monitoring: As per clinic protocol. Respiration, ETCO2, SpO2, BP, heart rate and rhythm monitor placed and checked for adequate function Safety Precautions: Patient was assessed for positional comfort and pressure points before starting the procedure. Time-out: I initiated and conducted the "Time-out" before starting the procedure, as per protocol. The patient was asked to participate by confirming the accuracy of the "Time Out" information. Verification of the correct person, site, and procedure were performed and confirmed by me, the nursing staff, and the patient. "Time-out" conducted as per Joint Commission's Universal Protocol (UP.01.01.01). Time: 1320  Description of Procedure:          Position: Supine Target Area: Central-port of intrathecal pump. Approach: Anterior, 90 degree angle approach. Area Prepped: Entire Area around the pump implant. DuraPrep (Iodine Povacrylex [0.7% available iodine] and Isopropyl Alcohol, 74% w/w) Safety Precautions: Aspiration looking for blood return was conducted prior to all injections. At no point did we inject any substances, as a needle was being advanced. No attempts were made at seeking any paresthesias.  Safe injection practices and needle disposal techniques used. Medications properly checked for expiration dates. SDV (single dose vial) medications used. Description of the Procedure: Protocol guidelines were followed. Two nurses trained to do implant refills were present during the entire procedure. The refill medication was checked by both healthcare providers as well as the patient. The patient was included in the "Time-out" to verify the medication. The patient was placed in position. The pump was identified. The area was prepped in the usual manner. The sterile template was positioned over the pump, making sure the side-port location matched that of the pump. Both, the pump and the template were held for stability. The needle provided in the Medtronic Kit was then introduced thru the center of the template and into the central port. The pump content was aspirated and discarded volume documented. The new medication was slowly infused into the pump, thru the filter, making sure to avoid overpressure of the device. The needle was then removed and the area cleansed, making sure to leave some of the prepping solution back to take advantage of its long term bactericidal properties. The pump was interrogated and programmed to reflect the correct medication, volume, and dosage. The program was printed and taken to the physician for approval. Once checked and signed by the physician, a copy was provided to the patient and another scanned into the EMR. Vitals:   02/08/21 1308  BP: 139/81  Pulse: (!) 54  Resp: 16  Temp: (!) 97.4 F (36.3 C)  SpO2: 99%  Weight: 163 lb (73.9 kg)  Height: '5\' 7"'  (1.702 m)    Start Time: 1320 hrs. End Time:   hrs. Materials & Medications: Medtronic Refill Kit Medication(s): Please see chart orders for details.  Imaging Guidance:          Type of Imaging Technique: None used Indication(s): N/A Exposure Time: No patient exposure Contrast: None used. Fluoroscopic Guidance:  N/A Ultrasound Guidance: N/A Interpretation: N/A  Antibiotic Prophylaxis:   Anti-infectives (From admission, onward)   None     Indication(s): None identified  Post-operative Assessment:  Post-procedure Vital Signs:  Pulse/HCG Rate: (!) 54  Temp: (!) 97.4 F (36.3 C) Resp: 16 BP: 139/81 SpO2: 99 %  EBL: None  Complications: No immediate post-treatment complications observed by team, or reported by patient.  Note: The patient tolerated the entire procedure well. A repeat set of vitals were taken after the procedure and the patient was kept under observation following institutional policy, for this type of procedure. Post-procedural neurological assessment was performed, showing return to baseline, prior to discharge. The patient was provided with post-procedure discharge instructions, including a section on how to identify potential problems. Should any problems arise concerning this procedure, the patient was given instructions to immediately contact us, at any time, without hesitation. In any case, we plan to contact the patient by telephone for a follow-up status report regarding this interventional procedure.  Comments:  No additional relevant information.  Plan of Care  Orders:  Orders Placed This Encounter  Procedures  . PUMP REFILL    Maintain Protocol by having two(2) healthcare providers during procedure and programming.    Scheduling Instructions:     Please refill intrathecal pump today.    Order Specific Question:   Where will this procedure be performed?    Answer:   [facility]  . PUMP REFILL    Whenever possible schedule on a procedure today.    Standing Status:   Future    Standing Expiration Date:   07/08/2021    Scheduling Instructions:     Please schedule intrathecal pump refill based on pump programming. Avoid schedule intervals of more than 120 days (4 months).    Order Specific Question:   Where will this procedure be performed?    Answer:    [facility]  . Informed Consent Details: Physician/Practitioner Attestation; Transcribe to consent form and obtain patient signature    Transcribe to consent form and obtain patient signature.    Order Specific Question:   Physician/Practitioner attestation of informed consent for procedure/surgical case    Answer:   I, the physician/practitioner, attest that I have discussed with the patient the benefits, risks, side effects, alternatives, likelihood of achieving goals and potential problems during recovery for the procedure that I have provided informed consent.    Order Specific Question:   Procedure    Answer:   Intrathecal pump refill    Order Specific Question:   Physician/Practitioner performing the procedure    Answer:   Attending Physician: Kathlen Brunswick. Dossie Arbour, MD & designated trained staff    Order Specific Question:   Indication/Reason    Answer:   Chronic Pain Syndrome (G89.4), presence of an intrathecal pump (Z97.8)   Chronic Opioid Analgesic:  Intrathecal PF-Fentanyl 502.2 mcg/day (20.9 mcg/hr) MME/day: 50.16 mg/day.   Medications ordered for procedure: No orders of the defined types were placed in this encounter.  Medications administered: Lexine Baton "Rick" had no medications administered during this visit.  See the medical record for exact dosing, route, and time of administration.  Follow-up plan:   Return for Pump Refill (Max:51mo.       Interventional management options:  Considering:   Palliative intrathecal pump refills  No other procedures currently under consideration   Palliative PRN treatment(s):   Palliative management of intrathecal pump      Recent Visits No visits were found meeting these conditions. Showing recent visits within past 90 days and meeting all other requirements Today's Visits Date Type Provider Dept  02/08/21 Procedure visit NMilinda Pointer MD [facility]  Showing today's visits and meeting all other  requirements Future Appointments No visits were found meeting these conditions. Showing future appointments within next 90 days and meeting all other requirements  Disposition: Discharge home  Discharge (Date  Time): 02/08/2021; 1334 hrs.   Primary Care Physician: Lequita Asal, MD Location: [facility] Note by: Gaspar Cola, MD Date: 02/08/2021; Time: 3:17 PM  Disclaimer:  Medicine is not an Chief Strategy Officer. The only guarantee in medicine is that nothing is guaranteed. It is important to note that the decision to proceed with this intervention was based on the information collected from the patient. The Data and conclusions were drawn from the patient's questionnaire, the interview, and the physical examination. Because the information was provided in large part by the patient, it cannot be guaranteed that it has not been purposely or unconsciously manipulated. Every effort has been made to obtain as much relevant data as possible for this evaluation. It is important to note that the conclusions that lead to this procedure are derived in large part from the available data. Always take into account that the treatment will also be dependent on availability of resources and existing treatment guidelines, considered by other Pain Management Practitioners as being common knowledge and practice, at the time of the intervention. For Medico-Legal purposes, it is also important to point out that variation in procedural techniques and pharmacological choices are the acceptable norm. The indications, contraindications, technique, and results of the above procedure should only be interpreted and judged by a Board-Certified Interventional Pain Specialist with extensive familiarity and expertise in the same exact procedure and technique.

## 2021-02-08 NOTE — Patient Instructions (Addendum)
Opioid Overdose Opioids are drugs that are often used to treat pain. Opioids include illegal drugs, such as heroin, as well as prescription pain medicines, such as codeine, morphine, hydrocodone, oxycodone, and fentanyl. An opioid overdose happens when you take too much of an opioid. An overdose may be intentional or accidental and can happen with any type of opioid. The effects of an overdose can be mild, dangerous, or even deadly. Opioid overdose is a medical emergency. What are the causes? This condition may be caused by:  Taking too much of an opioid on purpose.  Taking too much of an opioid by accident.  Using two or more substances that contain opioids at the same time.  Taking an opioid with a substance that affects your heart, breathing, or blood pressure. These include alcohol, tranquilizers, sleeping pills, illegal drugs, and some over-the-counter medicines. This condition may also happen due to an error made by:  A health care provider who prescribes a medicine.  The pharmacist who fills the prescription order. What increases the risk? This condition is more likely in:  Children. They may be attracted to colorful pills. Because of a child's small size, even a small amount of a drug can be dangerous.  Older people. They may be taking many different drugs. Older people may have difficulty reading labels or remembering when they last took their medicine. They may also be more sensitive to the effects of opioids.  People with chronic medical conditions, especially heart, liver, kidney, or neurological diseases.  People who take an opioid for a long period of time.  People who use: ? Illegal drugs. IV heroin is especially dangerous. ? Other substances, including alcohol, while using an opioid.  People who have: ? A history of drug or alcohol abuse. ? Certain mental health conditions. ? A history of previous drug overdoses.  People who take opioids that are not prescribed  for them. What are the signs or symptoms? Symptoms of this condition depend on the type of opioid and the amount that was taken. Common symptoms include:  Sleepiness or difficulty waking from sleep.  Decrease in attention.  Confusion.  Slurred speech.  Slowed breathing and a slow pulse (bradycardia).  Nausea and vomiting.  Abnormally small pupils. Signs and symptoms that require emergency treatment include:  Cold, clammy, and pale skin.  Blue lips and fingernails.  Vomiting.  Gurgling sounds in the throat.  A pulse that is very slow or difficult to detect.  Breathing that is very irregular, slow, noisy, or difficult to detect.  Limp body.  Inability to respond to speech or be awakened from sleep (stupor).  Seizures. How is this diagnosed? This condition is diagnosed based on your symptoms and medical history. It is important to tell your health care provider:  About all of the opioids that you took.  When you took the opioids.  Whether you were drinking alcohol or using marijuana, cocaine, or other drugs. Your health care provider will do a physical exam. This exam may include:  Checking and monitoring your heart rate and rhythm, breathing rate, temperature, and blood pressure (vital signs).  Measuring oxygen levels in your blood.  Checking for abnormally small pupils. You may also have blood tests or urine tests. You may have X-rays if you are having severe breathing problems. How is this treated? This condition requires immediate medical treatment and hospitalization. Treatment is given in the hospital intensive care (ICU) setting. Supporting your vital signs and your breathing is the first step in   treating an opioid overdose. Treatment may also include:  Giving salts and minerals (electrolytes) along with fluids through an IV.  Inserting a breathing tube (endotracheal tube) in your airway to help you breathe if you cannot breathe on your own or you are in  danger of not being able to breathe on your own.  Giving oxygen through a small tube under your nose.  Passing a tube through your nose and into your stomach (nasogastric tube, or NG tube) to empty your stomach.  Giving medicines that: ? Increase your blood pressure. ? Relieve nausea and vomiting. ? Relieve abdominal pain and cramping. ? Reverse the effects of the opioid (naloxone).  Monitoring your heart and oxygen levels.  Ongoing counseling and mental health support if you intentionally overdosed or used an illegal drug. Follow these instructions at home: Medicines  Take over-the-counter and prescription medicines only as told by your health care provider.  Always ask your health care provider about possible side effects and interactions of any new medicine that you start taking.  Keep a list of all the medicines that you take, including over-the-counter medicines. Bring this list with you to all your medical visits. General instructions  Drink enough fluid to keep your urine pale yellow.  Keep all follow-up visits as told by your health care provider. This is important.   How is this prevented?  Read the drug inserts that come with your opioid pain medicines.  Take medicines only as told by your health care provider. Do not take more medicine than you are told. Do not take medicines more frequently than you are told.  Do not drink alcohol or take sedatives when taking opioids.  Do not use illegal or recreational drugs, including cocaine, ecstasy, and marijuana.  Do not take opioid medicines that are not prescribed for you.  Store all medicines in safety containers that are out of the reach of children.  Get help if you are struggling with: ? Alcohol or drug use. ? Depression or another mental health problem. ? Thoughts of hurting yourself or another person.  Keep the phone number of your local poison control center near your phone or in your mobile phone. In the  [place] the hotline of the National Poison Control Center is (800) 222-1222.  If you were prescribed naloxone, make sure you understand how to take it. Contact a health care provider if you:  Need help understanding how to take your pain medicines.  Feel your medicines are too strong.  Are concerned that your pain medicines are not working well for your pain.  Develop new symptoms or side effects when you are taking medicines. Get help right away if:  You or someone else is having symptoms of an opioid overdose. Get help even if you are not sure.  You have serious thoughts about hurting yourself or others.  You have: ? Chest pain. ? Difficulty breathing. ? A loss of consciousness. These symptoms may represent a serious problem that is an emergency. Do not wait to see if the symptoms will go away. Get medical help right away. Call your local emergency services (911 in the [place] Do not drive yourself to the hospital. If you ever feel like you may hurt yourself or others, or have thoughts about taking your own life, get help right away. You can go to your nearest emergency department or call:  Your local emergency services (911 in the [place]  A suicide crisis helpline, such as the National Suicide Prevention Lifeline   at 1-800-273-8255. This is open 24 hours a day. Summary  Opioids are drugs that are often used to treat pain. Opioids include illegal drugs, such as heroin, as well as prescription pain medicines.  An opioid overdose happens when you take too much of an opioid.  Overdoses can be intentional or accidental.  Opioid overdose is very dangerous. It is a life-threatening emergency.  If you or someone you know is experiencing an opioid overdose, get help right away. This information is not intended to replace advice given to you by your health care provider. Make sure you discuss any questions you have with your health care provider. Document Revised: 09/12/2018 Document  Reviewed: 09/12/2018 Elsevier Patient Education  2021 [employer] Naloxone injection What is this medicine? NALOXONE (nal OX one) is a narcotic blocker. It is used to treat narcotic (opioid) drug overdose. It is used to temporarily reverse the effects of opioid medicines. This medicine has no effect in people who are not taking opioid medicines. This medicine may be used for other purposes; ask your health care provider or pharmacist if you have questions. COMMON BRAND NAME(S): EVZIO, Narcan What should I tell my health care provider before I take this medicine? They need to know if you have any of these conditions:  drug abuse or addiction  heart disease  an unusual or allergic reaction to naloxone, other medicines, foods, dyes, or preservatives  pregnant or trying to get pregnantbreast-feeding How should I use this medicine? This medicine may be administered in a hospital, clinic, or can be used by the public to give aid to a person who has overdosed until emergency medical help is available. This medicine is for injection into the outer thigh. It can be injected through clothing if needed. Get emergency medical help right away after giving the first dose of this medicine, even if the person wakes up. You should be familiar with how to recognize the signs and symptoms of a narcotic overdose. Administer according to the printed instructions on the device label or the electronic voice instructions. You should practice using the Trainer injector before this medicine is needed. Talk to your pediatrician regarding the use of this medicine in children. While this drug may be prescribed for children as young as newborn for selected conditions, precautions do apply. For infants less than 1 year of age, pinch the thigh muscle while administering. Overdosage: If you think you have taken too much of this medicine contact a poison control center or emergency room at once. NOTE: This medicine is only  for you. Do not share this medicine with others. What if I miss a dose? This does not apply. What may interact with this medicine? This medicine is only used during an emergency. No interactions are expected during emergency use. This list may not describe all possible interactions. Give your health care provider a list of all the medicines, herbs, non-prescription drugs, or dietary supplements you use. Also tell them if you smoke, drink alcohol, or use illegal drugs. Some items may interact with your medicine. What should I watch for while using this medicine? Keep this medicine ready for use in the case of a narcotic overdose. Make sure that you have the phone number of your doctor or health care professional and local hospital ready. You may need to have additional doses of this medicine. Each injector contains a single dose. Some emergencies may require additional doses. After use, bring the treated person to the nearest hospital or call 911. Make   sure the treating health care professional knows that the person has received an injection of this medicine. You will receive additional instructions on what to do during and after use of this medicine before an emergency occurs. What side effects may I notice from receiving this medicine? Side effects that you should report to your doctor or health care professional as soon as possible:  allergic reactions like skin rash, itching or hives, swelling of the face, lips, or tongue  breathing problems  fast, irregular heartbeat  high blood pressure  pain that was controlled by narcotic pain medicine  seizures Side effects that usually do not require medical attention (report to your doctor or health care professional if they continue or are bothersome):  anxious  chills  diarrhea  fever  nausea, vomiting  sweating This list may not describe all possible side effects. Call your doctor for medical advice about side effects. You may report  side effects to FDA at 1-800-FDA-1088. Where should I keep my medicine? Keep out of the reach of children. Store at room temperature between 15 and 25 degrees C (59 and 77 degrees F). If you are using this medicine at home, you will be instructed on how to store this medicine. Keep this medicine in its outer case until ready to use. Occasionally check the solution through the viewing window of the injector. The solution should be clear. If it is discolored, cloudy, or contains solid particles, replace it with a new injector. Remember to check the expiration date of this medicine regularly. Throw away any unused medicine after the expiration date. NOTE: This sheet is a summary. It may not cover all possible information. If you have questions about this medicine, talk to your doctor, pharmacist, or health care provider.  2021 Elsevier/Gold Standard (2015-10-05 16:45:38)  

## 2021-02-09 ENCOUNTER — Telehealth: Payer: Self-pay

## 2021-02-09 NOTE — Telephone Encounter (Signed)
Post procedure phone call.  Patient states he is doing good.  

## 2021-02-10 MED FILL — Medication: INTRATHECAL | Qty: 1 | Status: AC

## 2021-02-21 ENCOUNTER — Other Ambulatory Visit: Payer: Medicare Other

## 2021-02-21 ENCOUNTER — Inpatient Hospital Stay: Payer: Medicare Other

## 2021-02-21 ENCOUNTER — Other Ambulatory Visit: Payer: Self-pay

## 2021-02-21 ENCOUNTER — Inpatient Hospital Stay: Payer: Medicare Other | Attending: Hematology and Oncology

## 2021-02-21 ENCOUNTER — Ambulatory Visit: Payer: Medicare Other

## 2021-02-21 ENCOUNTER — Other Ambulatory Visit: Payer: Self-pay | Admitting: Oncology

## 2021-02-21 VITALS — BP 131/63 | HR 60 | Temp 97.0°F | Resp 18

## 2021-02-21 DIAGNOSIS — Z79899 Other long term (current) drug therapy: Secondary | ICD-10-CM | POA: Insufficient documentation

## 2021-02-21 DIAGNOSIS — D539 Nutritional anemia, unspecified: Secondary | ICD-10-CM

## 2021-02-21 DIAGNOSIS — C9000 Multiple myeloma not having achieved remission: Secondary | ICD-10-CM

## 2021-02-21 DIAGNOSIS — Z5112 Encounter for antineoplastic immunotherapy: Secondary | ICD-10-CM | POA: Insufficient documentation

## 2021-02-21 DIAGNOSIS — C9002 Multiple myeloma in relapse: Secondary | ICD-10-CM | POA: Diagnosis not present

## 2021-02-21 LAB — CBC WITH DIFFERENTIAL/PLATELET
Abs Immature Granulocytes: 0.03 10*3/uL (ref 0.00–0.07)
Basophils Absolute: 0 10*3/uL (ref 0.0–0.1)
Basophils Relative: 1 %
Eosinophils Absolute: 0.1 10*3/uL (ref 0.0–0.5)
Eosinophils Relative: 2 %
HCT: 36.7 % — ABNORMAL LOW (ref 39.0–52.0)
Hemoglobin: 12.5 g/dL — ABNORMAL LOW (ref 13.0–17.0)
Immature Granulocytes: 0 %
Lymphocytes Relative: 15 %
Lymphs Abs: 1.2 10*3/uL (ref 0.7–4.0)
MCH: 34.6 pg — ABNORMAL HIGH (ref 26.0–34.0)
MCHC: 34.1 g/dL (ref 30.0–36.0)
MCV: 101.7 fL — ABNORMAL HIGH (ref 80.0–100.0)
Monocytes Absolute: 0.3 10*3/uL (ref 0.1–1.0)
Monocytes Relative: 4 %
Neutro Abs: 6.1 10*3/uL (ref 1.7–7.7)
Neutrophils Relative %: 78 %
Platelets: 190 10*3/uL (ref 150–400)
RBC: 3.61 MIL/uL — ABNORMAL LOW (ref 4.22–5.81)
RDW: 13.2 % (ref 11.5–15.5)
WBC: 7.9 10*3/uL (ref 4.0–10.5)
nRBC: 0 % (ref 0.0–0.2)

## 2021-02-21 LAB — VITAMIN B12: Vitamin B-12: 522 pg/mL (ref 180–914)

## 2021-02-21 LAB — COMPREHENSIVE METABOLIC PANEL
ALT: 14 U/L (ref 0–44)
AST: 24 U/L (ref 15–41)
Albumin: 3.7 g/dL (ref 3.5–5.0)
Alkaline Phosphatase: 40 U/L (ref 38–126)
Anion gap: 10 (ref 5–15)
BUN: 21 mg/dL (ref 8–23)
CO2: 22 mmol/L (ref 22–32)
Calcium: 8.8 mg/dL — ABNORMAL LOW (ref 8.9–10.3)
Chloride: 103 mmol/L (ref 98–111)
Creatinine, Ser: 1.41 mg/dL — ABNORMAL HIGH (ref 0.61–1.24)
GFR, Estimated: 52 mL/min — ABNORMAL LOW (ref >60)
Glucose, Bld: 145 mg/dL — ABNORMAL HIGH (ref 70–99)
Potassium: 3.9 mmol/L (ref 3.5–5.1)
Sodium: 135 mmol/L (ref 135–145)
Total Bilirubin: 0.8 mg/dL (ref 0.3–1.2)
Total Protein: 6 g/dL — ABNORMAL LOW (ref 6.5–8.1)

## 2021-02-21 LAB — MAGNESIUM: Magnesium: 1.8 mg/dL (ref 1.7–2.4)

## 2021-02-21 LAB — TSH: TSH: 4.75 u[IU]/mL — ABNORMAL HIGH (ref 0.350–4.500)

## 2021-02-21 LAB — FOLATE: Folate: 56 ng/mL (ref >5.9)

## 2021-02-21 MED ORDER — HEPARIN SOD (PORK) LOCK FLUSH 100 UNIT/ML IV SOLN
INTRAVENOUS | Status: AC
  Filled 2021-02-21: qty 5

## 2021-02-21 MED ORDER — SODIUM CHLORIDE 0.9% FLUSH
10.0000 mL | Freq: Once | INTRAVENOUS | Status: AC
  Administered 2021-02-21: 10 mL via INTRAVENOUS
  Filled 2021-02-21: qty 10

## 2021-02-21 MED ORDER — HEPARIN SOD (PORK) LOCK FLUSH 100 UNIT/ML IV SOLN
500.0000 [IU] | Freq: Once | INTRAVENOUS | Status: AC
  Administered 2021-02-21: 500 [IU] via INTRAVENOUS
  Filled 2021-02-21: qty 5

## 2021-02-21 MED ORDER — SODIUM CHLORIDE 0.9 % IV SOLN
Freq: Once | INTRAVENOUS | Status: AC
Start: 2021-02-21 — End: 2021-02-21
  Filled 2021-02-21: qty 250

## 2021-02-21 MED ORDER — SODIUM CHLORIDE 0.9 % IV SOLN
16.0000 mg/kg | Freq: Once | INTRAVENOUS | Status: AC
  Administered 2021-02-21: 1200 mg via INTRAVENOUS
  Filled 2021-02-21: qty 60

## 2021-02-21 NOTE — Patient Instructions (Signed)
[facility] [place] [facility]  Discharge Instructions: Thank you for choosing [facility] to provide your oncology and hematology care.  If you have a lab appointment with the Cancer Center, please go directly to the Cancer Center and check in at the registration area.  Wear comfortable clothing and clothing appropriate for easy access to any Portacath or PICC line.   We strive to give you quality time with your provider. You may need to reschedule your appointment if you arrive late (15 or more minutes).  Arriving late affects you and other patients whose appointments are after yours.  Also, if you miss three or more appointments without notifying the office, you may be dismissed from the clinic at the provider's discretion.      For prescription refill requests, have your pharmacy contact our office and allow 72 hours for refills to be completed.    Today you received the following chemotherapy and/or immunotherapy agents       To help prevent nausea and vomiting after your treatment, we encourage you to take your nausea medication as directed.  BELOW ARE SYMPTOMS THAT SHOULD BE REPORTED IMMEDIATELY: *FEVER GREATER THAN 100.4 F (38 C) OR HIGHER *CHILLS OR SWEATING *NAUSEA AND VOMITING THAT IS NOT CONTROLLED WITH YOUR NAUSEA MEDICATION *UNUSUAL SHORTNESS OF BREATH *UNUSUAL BRUISING OR BLEEDING *URINARY PROBLEMS (pain or burning when urinating, or frequent urination) *BOWEL PROBLEMS (unusual diarrhea, constipation, pain near the anus) TENDERNESS IN MOUTH AND THROAT WITH OR WITHOUT PRESENCE OF ULCERS (sore throat, sores in mouth, or a toothache) UNUSUAL RASH, SWELLING OR PAIN  UNUSUAL VAGINAL DISCHARGE OR ITCHING   Items with * indicate a potential emergency and should be followed up as soon as possible or go to the Emergency Department if any problems should occur.  Please show the CHEMOTHERAPY ALERT CARD or IMMUNOTHERAPY ALERT CARD at check-in to the  Emergency Department and triage nurse.  Should you have questions after your visit or need to cancel or reschedule your appointment, please contact [facility] [place] [facility]  [phone] and follow the prompts.  Office hours are 8:00 a.m. to 4:30 p.m. Monday - Friday. Please note that voicemails left after 4:00 p.m. may not be returned until the following business day.  We are closed weekends and major holidays. You have access to a nurse at all times for urgent questions. Please call the main number to the clinic [phone] and follow the prompts.  For any non-urgent questions, you may also contact your provider using MyChart. We now offer e-Visits for anyone 18 and older to request care online for non-urgent symptoms. For details visit [email or web address].   Also download the MyChart app! Go to the app store, search "MyChart", open the app, select [facility], and log in with your MyChart username and password.  Due to Covid, a mask is required upon entering the hospital/clinic. If you do not have a mask, one will be given to you upon arrival. For doctor visits, patients may have 1 support person aged 18 or older with them. For treatment visits, patients cannot have anyone with them due to current Covid guidelines and our immunocompromised population.  

## 2021-02-22 LAB — KAPPA/LAMBDA LIGHT CHAINS
Kappa free light chain: 7.3 mg/L (ref 3.3–19.4)
Kappa, lambda light chain ratio: 1.87 — ABNORMAL HIGH (ref 0.26–1.65)
Lambda free light chains: 3.9 mg/L — ABNORMAL LOW (ref 5.7–26.3)

## 2021-02-23 LAB — PROTEIN ELECTROPHORESIS, SERUM
A/G Ratio: 1.8 — ABNORMAL HIGH (ref 0.7–1.7)
Albumin ELP: 3.6 g/dL (ref 2.9–4.4)
Alpha-1-Globulin: 0.1 g/dL (ref 0.0–0.4)
Alpha-2-Globulin: 0.8 g/dL (ref 0.4–1.0)
Beta Globulin: 0.7 g/dL (ref 0.7–1.3)
Gamma Globulin: 0.4 g/dL (ref 0.4–1.8)
Globulin, Total: 2 g/dL — ABNORMAL LOW (ref 2.2–3.9)
M-Spike, %: 0.1 g/dL — ABNORMAL HIGH
Total Protein ELP: 5.6 g/dL — ABNORMAL LOW (ref 6.0–8.5)

## 2021-02-25 LAB — MULTIPLE MYELOMA PANEL, SERUM
Albumin SerPl Elph-Mcnc: 3.6 g/dL (ref 2.9–4.4)
Albumin/Glob SerPl: 1.9 — ABNORMAL HIGH (ref 0.7–1.7)
Alpha 1: 0.1 g/dL (ref 0.0–0.4)
Alpha2 Glob SerPl Elph-Mcnc: 0.7 g/dL (ref 0.4–1.0)
B-Globulin SerPl Elph-Mcnc: 0.8 g/dL (ref 0.7–1.3)
Gamma Glob SerPl Elph-Mcnc: 0.2 g/dL — ABNORMAL LOW (ref 0.4–1.8)
Globulin, Total: 1.9 g/dL — ABNORMAL LOW (ref 2.2–3.9)
IgA: 23 mg/dL — ABNORMAL LOW (ref 61–437)
IgG (Immunoglobin G), Serum: 240 mg/dL — ABNORMAL LOW (ref 603–1613)
IgM (Immunoglobulin M), Srm: 34 mg/dL (ref 15–143)
M Protein SerPl Elph-Mcnc: 0.1 g/dL — ABNORMAL HIGH
Total Protein ELP: 5.5 g/dL — ABNORMAL LOW (ref 6.0–8.5)

## 2021-03-20 NOTE — Progress Notes (Signed)
[facility]  [address], [address] [place], [place] [address] Phone: [phone]  Fax: [phone]   Clinic Day:  03/20/2021  Referring physician: Lequita Asal, MD  Chief Complaint: Logan Perez is a 75 y.o. 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  HPI: The patient was last seen in the medical oncology clinic on 01/24/2021.   He last received Darzalex on 02/21/2021.  He reports doing well.  He has chronic low back pain for which she has a pain pump that takes care of 90% of his pain.  He sees Dr. Dossie Arbour.  He has chronic diarrhea approximately 2 loose stools daily.  Takes Imodium routinely to help manage this.  He is currently not taking potassium supplements.  He has history of atrial fibrillation and is followed by cardiology.  He saw Dr. Nehemiah Massed on 03/15/2021 for an episode of palpitations.  He has Cardizem that he takes as needed which she had to take last week.  No additional episodes.  He took his Singulair. He takes acyclovir as prescribed.   Past Medical History:  Diagnosis Date   Anxiety    Atrial fibrillation (HCC)    BPH (benign prostatic hyperplasia)    Complication of anesthesia    BAD HEADACHE NIGHT OF FIRST CATARACT   Compression fracture of lumbar vertebra (HCC)    Difficulty voiding    Dysrhythmia    A FIB   Elevated PSA    GERD (gastroesophageal reflux disease)    Hearing aid worn    bilateral   History of kidney stones    HLD (hyperlipidemia)    HOH (hard of hearing)    Hypertension    Multiple myeloma (HCC)    Neuropathy    feet. 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   Pain    BACK   Palpitations    Pneumonia    Pulmonary embolism (HCC)    Sepsis (HCC)    Stroke (HCC)    TIA, detected on CT scan. pt was unaware    Past Surgical History:  Procedure Laterality Date   BACK SURGERY  1994   CATARACT EXTRACTION W/PHACO Right  05/02/2016   Procedure: CATARACT EXTRACTION PHACO AND INTRAOCULAR LENS PLACEMENT (IOC);  Surgeon: Birder Robson, MD;  Location: [facility];  Service: Ophthalmology;  Laterality: Right;  [place] 1.06 AP% 20.6 CDE 13.70 FLUID PACK LOT # [id number]   CATARACT EXTRACTION W/PHACO Left 05/16/2016   Procedure: CATARACT EXTRACTION PHACO AND INTRAOCULAR LENS PLACEMENT (IOC);  Surgeon: Birder Robson, MD;  Location: [facility];  Service: Ophthalmology;  Laterality: Left;  [place] 01:43 AP% 19.8 CDE 20.45 FLUID PACK LOT #[id number] H   COLONOSCOPY WITH PROPOFOL N/A 03/22/2017   Procedure: COLONOSCOPY WITH PROPOFOL;  Surgeon: Lucilla Lame, MD;  Location: [facility];  Service: Endoscopy;  Laterality: N/A;  has port   ESOPHAGOGASTRODUODENOSCOPY (EGD) WITH PROPOFOL  03/22/2017   Procedure: ESOPHAGOGASTRODUODENOSCOPY (EGD) WITH PROPOFOL;  Surgeon: Lucilla Lame, MD;  Location: [facility];  Service: Endoscopy;;   EYE SURGERY     KNEE ARTHROSCOPY Left 1992   LIMBAL STEM CELL TRANSPLANT     PAIN PUMP IMPLANTATION  2012   PAIN PUMP IMPLANTATION N/A 09/04/2017   Procedure: INTRATHECAL PUMP BATTERY CHANGE;  Surgeon: Milinda Pointer, MD;  Location: [facility];  Service: Neurosurgery;  Laterality: N/A;   PORTA CATH INSERTION N/A 12/04/2016   Procedure: Glori Luis Cath Insertion;  Surgeon: Algernon Huxley, MD;  Location: [facility];  Service: Cardiovascular;  Laterality: N/A;   stem cell implant  2008   [facility]    Family History  Problem Relation Age of Onset   Cancer Father        throat   Kidney disease Sister    Stroke Other    Stroke Mother    Bladder Cancer Neg Hx    Prostate cancer Neg Hx    Kidney cancer Neg Hx     Social History:  reports that he quit smoking about 45 years ago. His smoking use included cigarettes. He has a 30.00 pack-year smoking history. He has never used smokeless tobacco. He reports that he does not drink alcohol and does not use drugs. He has a wire haired dachshund. He lives in  [place]. His wife's name is [name]. The patient is alone today.  Allergies:  Allergies  Allergen Reactions   Ciprofloxacin     diarhea   Azithromycin Diarrhea and Other (See Comments)    Possible cause of C. Diff Possible cause of C. Diff Possible cause of C. Diff Possible cause of C. Diff   Bee Pollen Other (See Comments)    Sneezing, watery eyes, runny nose   Pollen Extract Other (See Comments)    Sneezing, watery eyes, runny nose   Zoledronic Acid Other (See Comments)    ONG- Osteonecrosis of the jaw  Osteonecrosis of the jaw   Rivaroxaban Rash    Current Medications: Current Outpatient Medications  Medication Sig Dispense Refill   acetaminophen (TYLENOL) 500 MG tablet Take 500 mg by mouth 2 (two) times a day.     baclofen (LIORESAL) 10 MG tablet Take 1 tablet (10 mg total) by mouth 2 (two) times daily. 180 tablet 0   bismuth subsalicylate (PEPTO BISMOL) 262 MG/15ML suspension Take 30 mLs by mouth every 6 (six) hours as needed for diarrhea or loose stools.     calcium citrate-vitamin D (CITRACAL+D) 315-200 MG-UNIT tablet Take 1 tablet by mouth 2 (two) times daily.     cetirizine (ZYRTEC) 10 MG tablet Take 10 mg by mouth daily as needed for allergies.      Cholecalciferol (VITAMIN D3) 2000 units capsule Take 2,000 Units daily by mouth.      ciprofloxacin (CIPRO) 500 MG tablet Take 1 tablet (500 mg total) by mouth 2 (two) times daily. (Patient not taking: No sig reported) 10 tablet 0   Daratumumab (DARZALEX IV) Inject 1,200 mg every 30 (thirty) days into the vein.      dexamethasone (DECADRON) 4 MG tablet TAKE 4 TABLETS BY MOUTH 1 HOUR PRIOR TO INFUSION, THEN 1 TABLET ON DAYS 2 AND 3 AS DIRECTED 30 tablet 0   diltiazem (CARDIZEM CD) 120 MG 24 hr capsule Take 120 mg daily by mouth.      diltiazem (CARDIZEM) 60 MG tablet Take 60 mg by mouth daily as needed (for increased heart rate > 140).     diphenhydrAMINE (BENADRYL) 25 MG tablet Take 25 mg by mouth as directed. With chemo  treatment     ELIQUIS 5 MG TABS tablet TAKE 1 TABLET TWICE A DAY 180 tablet 1   fluticasone (FLONASE) 50 MCG/ACT nasal spray Place 1 spray into the nose daily as needed for allergies.     Hypromellose (ARTIFICIAL TEARS OP) Place 1 drop as needed into both eyes (for dry eyes).     loperamide (IMODIUM) 2 MG capsule Take 4 mg as needed by mouth for diarrhea or loose stools.  loratadine (CLARITIN) 10 MG tablet Take 10 mg by mouth daily as needed.      lovastatin (MEVACOR) 20 MG tablet Take 20 mg by mouth every evening.      montelukast (SINGULAIR) 10 MG tablet Take 1 tablet by mouth on the day before, day of, and 2 days after treatment. 30 tablet 1   Multiple Vitamins-Iron (MULTIVITAMIN/IRON PO) Take 1 tablet by mouth daily.      naloxone (NARCAN) 2 MG/2ML injection Inject 1 mL (1 mg total) into the muscle as needed for up to 2 doses (for opioid overdose). In case of emergency (overdose), inject into muscle of upper arm or leg and call 911. 2 mL 1   NON FORMULARY IT pump  Fentanyl 1,500.0 mcg/ml Bupivicaine 30.0 mg/ml Clonidine 300.0 mcg/ml Rate 500.4 mg/day     omeprazole (PRILOSEC) 20 MG capsule Take 20 mg by mouth daily.      ondansetron (ZOFRAN) 8 MG tablet Take 1 tablet (8 mg total) by mouth as directed. Take with chemo and can take it as needed 20 tablet 1   PAIN MANAGEMENT INTRATHECAL, IT, PUMP 1 each by Intrathecal route. Intrathecal (IT) medication:  Fentanyl 1500 mcg/ml, bupivicaine 30.0 mg/ml, clonidine 300.0 mcg/ml Daily dose fent,.500.34mg/day, bupivacaine 10.0048mday, clonidine 1000.07 mcg/day     potassium chloride SA (KLOR-CON) 20 MEQ tablet TAKE 1 TABLET(20 MEQ) BY MOUTH DAILY (Patient not taking: No sig reported) 90 tablet 0   tamsulosin (FLOMAX) 0.4 MG CAPS capsule TAKE 1 CAPSULE BY MOUTH DAILY 90 capsule 2   valACYclovir (VALTREX) 500 MG tablet TAKE 1 TABLET DAILY 90 tablet 3   vitamin B-12 (CYANOCOBALAMIN) 1000 MCG tablet Take 1,000 mcg daily by mouth.      No current  facility-administered medications for this visit.    Review of Systems  Constitutional:  Positive for malaise/fatigue. Negative for chills, fever and weight loss.  HENT:  Positive for hearing loss. Negative for congestion, ear pain and tinnitus.   Eyes: Negative.  Negative for blurred vision and double vision.  Respiratory: Negative.  Negative for cough, sputum production and shortness of breath.   Cardiovascular: Negative.  Negative for chest pain, palpitations and leg swelling.  Gastrointestinal: Negative.  Negative for abdominal pain, constipation, diarrhea, nausea and vomiting.  Genitourinary:  Negative for dysuria, frequency and urgency.  Musculoskeletal:  Positive for back pain. Negative for falls.  Skin: Negative.  Negative for rash.  Neurological: Negative.  Negative for weakness and headaches.  Endo/Heme/Allergies: Negative.  Does not bruise/bleed easily.  Psychiatric/Behavioral: Negative.  Negative for depression. The patient is not nervous/anxious and does not have insomnia.   Performance status (ECOG): 1  Vitals There were no vitals taken for this visit.   Physical Exam Constitutional:      Appearance: He is well-developed.  HENT:     Head: Normocephalic and atraumatic.  Eyes:     Pupils: Pupils are equal, round, and reactive to light.  Cardiovascular:     Rate and Rhythm: Normal rate and regular rhythm.     Heart sounds: No murmur heard. Pulmonary:     Effort: Pulmonary effort is normal.     Breath sounds: Normal breath sounds. No wheezing.  Abdominal:     General: Bowel sounds are normal. There is no distension.     Palpations: Abdomen is soft. There is no mass.     Tenderness: There is no abdominal tenderness.  Musculoskeletal:        General: Normal range of motion.  Cervical back: Normal range of motion.  Skin:    General: Skin is warm and dry.  Neurological:     Mental Status: He is alert and oriented to person, place, and time.  Psychiatric:         Behavior: Behavior normal.   No visits with results within 3 Day(s) from this visit.  Latest known visit with results is:  Orders Only on 02/21/2021  Component Date Value Ref Range Status   Sodium 02/21/2021 135  135 - 145 mmol/L Final   Potassium 02/21/2021 3.9  3.5 - 5.1 mmol/L Final   Chloride 02/21/2021 103  98 - 111 mmol/L Final   CO2 02/21/2021 22  22 - 32 mmol/L Final   Glucose, Bld 02/21/2021 145 (A) 70 - 99 mg/dL Final   Glucose reference range applies only to samples taken after fasting for at least 8 hours.   BUN 02/21/2021 21  8 - 23 mg/dL Final   Creatinine, Ser 02/21/2021 1.41 (A) 0.61 - 1.24 mg/dL Final   Calcium 02/21/2021 8.8 (A) 8.9 - 10.3 mg/dL Final   Total Protein 02/21/2021 6.0 (A) 6.5 - 8.1 g/dL Final   Albumin 02/21/2021 3.7  3.5 - 5.0 g/dL Final   AST 02/21/2021 24  15 - 41 U/L Final   ALT 02/21/2021 14  0 - 44 U/L Final   Alkaline Phosphatase 02/21/2021 40  38 - 126 U/L Final   Total Bilirubin 02/21/2021 0.8  0.3 - 1.2 mg/dL Final   GFR, Estimated 02/21/2021 52 (A) >60 mL/min Final   Comment: (NOTE) Calculated using the CKD-EPI Creatinine Equation (2021)    Anion gap 02/21/2021 10  5 - 15 Final   Performed at [facility], [address], [place], [place] [address]    Assessment:  Logan Perez is a 75 y.o. male with stage III mutiple myeloma.  He initially presented with progressive back pain beginning in 12/2006.  MRI revealed "spots and compression fractures".  He began Velcade, thalidomide, and Decadron.  In 08/2007, he underwent high dose chemotherapy and autologous stem cell transplant.  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   He recurred with a rising M-spike (2.7) with repeat M spike (1.7 gm/dl) in 03/2010.  He was initially treated with Velcade (02/08/2010 - 05/10/2010).  He then began Revlimid (15 mg 3 weeks on/1 week off) and Decadron (40 mg on day 1, 8, 15, 22).  Because of significant side effect with  Decadron his dose was decreased to 10 mg once a week in 07/2010.     He was on maintenance Revlimid.  Revlimid was initially10 mg 3 weeks on/1 week off.  This was changed to 10 mg 2 weeks on/2 weeks off secondary to right nipple tenderness.  His dose was increased to 10 mg 3 weeks on/1 week off with Decadron 10 mg a week (on Sundays) and then Revlamid 15 mg 3 weeks on and 1 week off with Decadron on Sundays.  He began Pomalyst 4 mg 3 weeks on/1 week off with Decadron on 08/27/2015.   SPEP revealed no monoclonal protein (04/21/2015) and 0.5 gm/dL on 09/22/2015 and 10/20/2015.  M spike was 0.1 on 02/02/2016, 03/01/2016, 03/29/2016, 04/26/2016, and 05/24/2016.  M spike was 0 on 10/11/2016, 11/28/2016, 02/05/2017, 03/21/2017, 08/02/2017, 10/04/2017, 12/27/2017, 01/24/2018, and 02/21/2018.  M spike was 0.1 on 11/01/2017, 0.1 on 11/29/2017, 0 on 12/27/2017, 0 on 01/24/2018, 0 on 02/21/2018, 0.1 on 03/21/2018, 0.3 on 04/18/2018, 0.1 on 05/16/2018, 0 on 06/13/2018,  0.2 on 08/08/2018, 0 on 09/09/2018, 0.1 on 11/04/2018, 0 on 12/02/2018, 0.1 on 12/30/2018, 0 on 01/27/2019, 0.1 on 02/24/2019, 0.1 on 03/24/2019, 0.1 on 04/23/2019, 0 on 05/19/2019, 0.1 on 06/17/2019, 0.2 on 07/15/2019, 0.2 on 08/18/2019, 0.2 on 09/15/2019, 0.1 on 10/13/2019, 0.1 on 11/13/2019, 0 on 12/11/2019, 0.3 on 01/08/2020, 0.2 on 02/05/2020, 0.2 on 03/04/2020, 0.2 on 04/01/2020, 0 on 05/05/2020, 0.4 on 06/07/2020, 0.4 on 07/06/2020, 0.3 on 08/03/2020, 0.1 on 08/31/2020, 0.3 on 09/28/2020, and 0.2 on 10/26/2020.    Free light chains have been monitored.  Kappa free light chains were 18.54 on 11/21/2013, 18.37 on 02/20/2014, 18.93 on 05/22/2014, 32.58 (high; normal ratio 1.27) on 08/21/2014, 51.53 (high; elevated ratio of 2.12) on 11/13/2014, 28.08 (ratio 1.73) on 12/09/2014, 23.71 (ratio 2.17) on 01/18/2015, 92.93 (ratio 9.49) on 04/21/2015, 93.44 (ratio 10.28) on 05/26/2015, 255.45 (ratio of 24.05) on 07/14/2015, 373.89 (ratio 48.31) on 08/04/2015,  474.33 (ratio 70.58) on 08/25/2015, 450.76 (ratio 55.44) on 09/22/2015, 453.4 (ratio 59.89) on 10/20/2015, 58.26 (ratio of > 40.74) on 02/02/2016, 62.67 (ratio of 37.98) on 03/01/2016, 75.7 (ratio > 50.47) on 03/29/2016, 79.7 (ratio 53.13) on 04/26/2016, 108.6 (ratio > 72.4) on 05/24/2016, 8.3 (1.46 ratio) on 10/11/2016, 6.7 (0.81 ratio) on 02/05/2017, 7.8 (1.01 ratio) on 03/21/2017,7.6 (ratio 0.63) on 06/01/2017, 8.1 (ratio 1.13) on 11/29/2017, 9.6 (ratio 1.55) on 06/13/2018, 6.6 (ratio 2.06) on 07/11/2018, 6.9 (ratio 0.82) on 08/08/2018, 5.8 (ratio 1.66) on 09/09/2018, 5.8 (ratio 1.05) on 11/04/2018, 5.6 (ratio 2.55) on 12/02/2018, 5.5 (ratio 1.96) on 01/27/2019, 7.6 (ratio 2.38) on 06/17/2019, 6.9 (ratio 1.21) on 11/13/2019, 6.6 (ratio 2.0) on 12/11/2019, 7.9 (ratio 2.14) on 02/05/2020, 6.6 (ratio 2.28) on 04/01/2020, 7.0 (ratio 2.12) on 05/05/2020, 9.3 (ratio 2.16) on 07/06/2020,  7.1 (ratio 2.09) on 08/31/2020, and 6.9 (ratio 2.09) on 10/26/2020.   Bone survey on 12/08/2014 was stable.  Bone survey on 10/21/2015 revealed increase conspicuity of subcentimeter lytic lesions in the calvarium.  PET scan on 08/14/2018 revealed no evidence of active myeloma.  Bone survey on 11/04/2019 revealed stable small lytic lesions throughout the skull.  There were no other lytic lesions.   Bone marrow aspirate and biopsy on 11/04/2015 revealed an atypical monoclonal plasma cells estimated at 30-40% of marrow cells.   Marrow was variably cellular (approximately 45%) with background trilineage hematopoiesis. There was no significant increase in marrow reticulin fibers. Storage iron was present.     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  He develoed herpes zoster in 04/2008.  He developed a pulmonary embolism in 05/2013.  He was initially on Xarelto, but is now on Eliquis.  He had an episode of pneumonia around this time requiring a brief admission.  He developed  severe lower leg cramps on 08/07/2014 secondary to hypokalemia.  Duplex was negative.    He was treated for C difficile colitis (Flagyl completed 07/30/2015).  He has a chronic indwelling pain pump.   He received 4 cycles of Pomalyst and Decadron (08/27/2015 - 11/19/2015).  Restaging studies document progressive disease.  Kappa free light chains are increasing.  SPEP revealed 0.5 gm/dL monoclonal protein then 1.3 gm/dL.  Bone survey reveals increase conspicuity of subcentimeter lytic lesions in the calvarium.  Bone marrow reveals 30-40% plasma cells.    MUGA on 11/30/2015 revealed an ejection fraction of 46%.  He is felt not to be a good candidate for Kyprolis.  There were no focal wall motion abnormalities. He had a stress echo less than 1  year ago.  He has a history of PVCs and atrial fibrillation.  He takes Cardizem prn.   He received 17 weeks of daratumumab (Darzalex) (12/09/2015 - 05/25/2016).  He tolerated treatment well without side effect.   Bone marrow on 02/09/2016 revealed no diagnostic morphologic evidence of plasma cell myeloma.  Marrow was normocellular to hypocellular marrow for age (ranging from 10-40%) with maturing trilineage hematopoiesis and mild multilineage dyspoiesis.  There was patchy mild increase in reticulin.  Storage iron was present.  Flow cytometry revealed no definitive evidence of monoclonality.  There was a non-specific atypical myeloid and monocytic findings with no increase in blasts.  Cytogenetics were normal (46, XY).   He is currently 54 months s/p 2nd autologous stem cell transplant on 06/16/2016.  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  He also experienced atrial fibrillation with intermittent episodes of RVR requiring IV beta blockers.  He is on prophylactic valacyclovir for 1 year post transplant.   He started vaccinations (DTaP-Pediatric triple vaccine, Hep B- Pediatrix triple vaccine, Haemophilus  influenza B (Hib), inactivated polio virus (IPV), pneumococcal conjugate vaccine 13-valent (PCV 13)) on 04/17/2017.  Recommendation was for Shingrix vaccine to be given in [place] and second dose of Shingrix in 2 months.  He had follow-up vaccinations on 08/28/2017.  He had his second shingles vaccine. He received 18 month vaccinations - DTaP-Pediatric triple vaccine, Hep B- Pediatrix triple vaccine, Haemophilus influenza B (Hib), inactivated polio virus (IPV), pneumococcal conjugate vaccine 13-valent (PCV 13) on 02/05/2018.   He is s/p 52 cycles of daratumumab (Darzalex) post transplant (12/05/2016 - 12/27/20).   PET scan on 08/14/2018 revealed no evidence of active myeloma on whole-body FDG PET scan.  There was no evidence soft tissue plasmacytoma.  There was no clear evidence of lytic lesions on the CT portion exam. Some lucencies in the pelvis were stable. There were chronic compression fractures in the lower thoracic spine.  Bone survey on 11/04/2019 revealed stable small lytic lesions noted throughout the skull.  There were no other lytic lesions. Exam was stable.  Bone survey on 11/10/2020 revealed no focal lytic lesions.  There are stable compression fractures of T5, T10 and T12.    He has chronic diarrhea. GI panel by PCR was + enteropathogenic E coli (EPEC) on 11/30/2020 and 12/13/2020.  Stool was + for adenovirus on 11/30/2020.  Stool was negative for EPEC on 12/22/2020.  He was treated with ciprofloxacin (last 12/21/2020).  He has a history of hypomagnesemia that required IV magnesium (2-4 gm) weekly.  He has not required magnesium since 10/24/2016.  Patient has atrial fibrillation and is on Eliquis.  Symptomatically, he feels "well." His bowels have returned to normal.   Plan:  Multiple myeloma Clinically, he is doing well. M spike fluctuates between 0.0 - 0.5 gm/dL in the past year.  M spike from today is pending.  PET scan on 08/14/2018 revealed no evidence of active  myeloma. Bone survey on 11/10/2020 revealed no focal lytic lesions. There were stable compression fractions of T5, T10 and T12. Labs reviewed.  Cycle #52 daratumumab today.   He continues premedications taken at home: Tylenol, Benadryl, ondansetron, Singulair and Decadron.   Takes Decadron 24 mg a month (discuss potential for decreasing dose). Consider switch to daratumumab SQ. Return to clinic in 1 month for treatment and to establish care with Dr. Rogue Bussing. Continue prophylactic valacyclovir (until 3 months s/p daratumumab). He is no longer followed at the transplant center.  Macrocytic anemia Labs from today show show a hemoglobin of 13.2.  B12 and folate were normal on 02/21/2021.             Neuropathy Neuropathy stable. He remains on baclofen.  Chronic diarrhea Diarrhea is back to baseline if not completely resolved. He has had E coli (EPEC) and adenovirus back in late February early March 2022. Daratumumab side effects: 16% diarrhea Continue to monitor.  History of pulmonary embolism and atrial fibrillation Followed by Dr. Nehemiah Massed Continue Eliquis. Has Cardizem for prolonged palpitations. He denies any bruising or bleeding.  CKD: Creatinine appears to fluctuate (1.17-1.59) since 2018.  He receives IVF as needed to help with his hydration status.  Disposition: Proceed with next cycle daratumumab today.  RTC in 4 weeks for labs (CBC with diff, CMP, Mg, SPEP, FLCA) and cycle # 52 daratumumab and 8 weeks for labs (CBC with diff, CMP, Mg, SPEP, FLCA), MD assessment and cycle # 53 daratumumab.  Greater than 50% was spent in counseling and coordination of care with this patient including but not limited to discussion of the relevant topics above (See A&P) including, but not limited to diagnosis and management of acute and chronic medical conditions.   Faythe Casa, NP 03/20/2021 8:15 PM

## 2021-03-21 ENCOUNTER — Other Ambulatory Visit: Payer: Medicare Other

## 2021-03-21 ENCOUNTER — Encounter: Payer: Self-pay | Admitting: Oncology

## 2021-03-21 ENCOUNTER — Inpatient Hospital Stay (HOSPITAL_BASED_OUTPATIENT_CLINIC_OR_DEPARTMENT_OTHER): Payer: Medicare Other | Admitting: Oncology

## 2021-03-21 ENCOUNTER — Inpatient Hospital Stay: Payer: Medicare Other | Attending: Oncology

## 2021-03-21 ENCOUNTER — Ambulatory Visit: Payer: Medicare Other | Admitting: Hematology and Oncology

## 2021-03-21 ENCOUNTER — Ambulatory Visit: Payer: Medicare Other

## 2021-03-21 ENCOUNTER — Other Ambulatory Visit: Payer: Self-pay | Admitting: Internal Medicine

## 2021-03-21 ENCOUNTER — Inpatient Hospital Stay: Payer: Medicare Other

## 2021-03-21 ENCOUNTER — Other Ambulatory Visit: Payer: Self-pay

## 2021-03-21 VITALS — BP 96/65 | HR 104 | Temp 98.4°F | Resp 20 | Wt 163.5 lb

## 2021-03-21 VITALS — HR 79

## 2021-03-21 DIAGNOSIS — Z79899 Other long term (current) drug therapy: Secondary | ICD-10-CM | POA: Insufficient documentation

## 2021-03-21 DIAGNOSIS — C9000 Multiple myeloma not having achieved remission: Secondary | ICD-10-CM | POA: Insufficient documentation

## 2021-03-21 DIAGNOSIS — N189 Chronic kidney disease, unspecified: Secondary | ICD-10-CM | POA: Diagnosis not present

## 2021-03-21 DIAGNOSIS — I129 Hypertensive chronic kidney disease with stage 1 through stage 4 chronic kidney disease, or unspecified chronic kidney disease: Secondary | ICD-10-CM | POA: Insufficient documentation

## 2021-03-21 DIAGNOSIS — Z5112 Encounter for antineoplastic immunotherapy: Secondary | ICD-10-CM | POA: Insufficient documentation

## 2021-03-21 DIAGNOSIS — C9002 Multiple myeloma in relapse: Secondary | ICD-10-CM

## 2021-03-21 LAB — CBC WITH DIFFERENTIAL/PLATELET
Abs Immature Granulocytes: 0.03 10*3/uL (ref 0.00–0.07)
Basophils Absolute: 0 10*3/uL (ref 0.0–0.1)
Basophils Relative: 0 %
Eosinophils Absolute: 0.1 10*3/uL (ref 0.0–0.5)
Eosinophils Relative: 1 %
HCT: 37.5 % — ABNORMAL LOW (ref 39.0–52.0)
Hemoglobin: 13.2 g/dL (ref 13.0–17.0)
Immature Granulocytes: 0 %
Lymphocytes Relative: 17 %
Lymphs Abs: 1.2 10*3/uL (ref 0.7–4.0)
MCH: 34.9 pg — ABNORMAL HIGH (ref 26.0–34.0)
MCHC: 35.2 g/dL (ref 30.0–36.0)
MCV: 99.2 fL (ref 80.0–100.0)
Monocytes Absolute: 0.3 10*3/uL (ref 0.1–1.0)
Monocytes Relative: 4 %
Neutro Abs: 5.2 10*3/uL (ref 1.7–7.7)
Neutrophils Relative %: 78 %
Platelets: 219 10*3/uL (ref 150–400)
RBC: 3.78 MIL/uL — ABNORMAL LOW (ref 4.22–5.81)
RDW: 13.7 % (ref 11.5–15.5)
WBC: 6.8 10*3/uL (ref 4.0–10.5)
nRBC: 0 % (ref 0.0–0.2)

## 2021-03-21 LAB — MAGNESIUM: Magnesium: 1.9 mg/dL (ref 1.7–2.4)

## 2021-03-21 MED ORDER — SODIUM CHLORIDE 0.9 % IV SOLN
16.0000 mg/kg | Freq: Once | INTRAVENOUS | Status: AC
  Administered 2021-03-21: 1200 mg via INTRAVENOUS
  Filled 2021-03-21: qty 60

## 2021-03-21 MED ORDER — SODIUM CHLORIDE 0.9 % IV SOLN
Freq: Once | INTRAVENOUS | Status: AC
  Filled 2021-03-21: qty 250

## 2021-03-21 MED ORDER — SODIUM CHLORIDE 0.9 % IV SOLN
INTRAVENOUS | Status: DC
  Filled 2021-03-21 (×2): qty 250

## 2021-03-21 MED ORDER — HEPARIN SOD (PORK) LOCK FLUSH 100 UNIT/ML IV SOLN
INTRAVENOUS | Status: AC
  Filled 2021-03-21: qty 5

## 2021-03-21 MED ORDER — HEPARIN SOD (PORK) LOCK FLUSH 100 UNIT/ML IV SOLN
500.0000 [IU] | Freq: Once | INTRAVENOUS | Status: AC
  Administered 2021-03-21: 500 [IU] via INTRAVENOUS
  Filled 2021-03-21: qty 5

## 2021-03-21 MED ORDER — SODIUM CHLORIDE 0.9% FLUSH
10.0000 mL | INTRAVENOUS | Status: DC | PRN
  Administered 2021-03-21: 10 mL via INTRAVENOUS
  Filled 2021-03-21: qty 10

## 2021-03-21 NOTE — Patient Instructions (Signed)
Daratumumab injection What is this medication? DARATUMUMAB (dar a toom ue mab) is a monoclonal antibody. It is used to treat multiple myeloma. This medicine may be used for other purposes; ask your health care provider or pharmacist if you have questions. COMMON BRAND NAME(S): DARZALEX What should I tell my care team before I take this medication? They need to know if you have any of these conditions: hereditary fructose intolerance infection (especially a virus infection such as chickenpox, herpes, or hepatitis B virus) lung or breathing disease (asthma, COPD) an unusual or allergic reaction to daratumumab, sorbitol, other medicines, foods, dyes, or preservatives pregnant or trying to get pregnant breast-feeding How should I use this medication? This medicine is for infusion into a vein. It is given by a health care professional in a hospital or clinic setting. Talk to your pediatrician regarding the use of this medicine in children. Special care may be needed. Overdosage: If you think you have taken too much of this medicine contact a poison control center or emergency room at once. NOTE: This medicine is only for you. Do not share this medicine with others. What if I miss a dose? Keep appointments for follow-up doses as directed. It is important not to miss your dose. Call your doctor or health care professional if you are unable to keep an appointment. What may interact with this medication? Interactions have not been studied. This list may not describe all possible interactions. Give your health care provider a list of all the medicines, herbs, non-prescription drugs, or dietary supplements you use. Also tell them if you smoke, drink alcohol, or use illegal drugs. Some items may interact with your medicine. What should I watch for while using this medication? Your condition will be monitored carefully while you are receiving this medicine. This medicine can cause serious allergic  reactions. To reduce your risk, your health care provider may give you other medicine to take before receiving this one. Be sure to follow the directions from your health care provider. This medicine can affect the results of blood tests to match your blood type. These changes can last for up to 6 months after the final dose. Your healthcare provider will do blood tests to match your blood type before you start treatment. Tell all of your healthcare providers that you are being treated with this medicine before receiving a blood transfusion. This medicine can affect the results of some tests used to determine treatment response; extra tests may be needed to evaluate response. Do not become pregnant while taking this medicine or for 3 months after stopping it. Women should inform their health care provider if they wish to become pregnant or think they might be pregnant. There is a potential for serious side effects to an unborn child. Talk to your health care provider for more information. Do not breast-feed an infant while taking this medicine. What side effects may I notice from receiving this medication? Side effects that you should report to your doctor or health care professional as soon as possible: allergic reactions (skin rash, itching, hives, swelling of the face, lips, or tongue) blurred vision infection (fever, chills, cough, sore throat, pain or difficulty passing urine) infusion reaction (dizziness, fast heartbeat, feeling faint or lightheaded, falls, headache, increase in blood pressure, nausea, vomiting, or wheezing or trouble breathing with loud or whistling sounds) unusual bleeding or bruising Side effects that usually do not require medical attention (report to your doctor or health care professional if they continue or are bothersome):   constipation diarrhea pain, tingling, numbness in the hands or feet swelling of the ankles, feet, hands tiredness This list may not describe all  possible side effects. Call your doctor for medical advice about side effects. You may report side effects to FDA at 1-800-FDA-1088. Where should I keep my medication? This drug is given in a hospital or clinic and will not be stored at home. NOTE: This sheet is a summary. It may not cover all possible information. If you have questions about this medicine, talk to your doctor, pharmacist, or health care provider.  2022 Elsevier/Gold Standard (2020-11-04 12:50:38)  

## 2021-03-22 LAB — KAPPA/LAMBDA LIGHT CHAINS
Kappa free light chain: 7.1 mg/L (ref 3.3–19.4)
Kappa, lambda light chain ratio: 1.78 — ABNORMAL HIGH (ref 0.26–1.65)
Lambda free light chains: 4 mg/L — ABNORMAL LOW (ref 5.7–26.3)

## 2021-03-24 LAB — MULTIPLE MYELOMA PANEL, SERUM
Albumin SerPl Elph-Mcnc: 4.2 g/dL (ref 2.9–4.4)
Albumin/Glob SerPl: 2.2 — ABNORMAL HIGH (ref 0.7–1.7)
Alpha 1: 0.2 g/dL (ref 0.0–0.4)
Alpha2 Glob SerPl Elph-Mcnc: 0.8 g/dL (ref 0.4–1.0)
B-Globulin SerPl Elph-Mcnc: 0.8 g/dL (ref 0.7–1.3)
Gamma Glob SerPl Elph-Mcnc: 0.2 g/dL — ABNORMAL LOW (ref 0.4–1.8)
Globulin, Total: 2 g/dL — ABNORMAL LOW (ref 2.2–3.9)
IgA: 23 mg/dL — ABNORMAL LOW (ref 61–437)
IgG (Immunoglobin G), Serum: 238 mg/dL — ABNORMAL LOW (ref 603–1613)
IgM (Immunoglobulin M), Srm: 31 mg/dL (ref 15–143)
Total Protein ELP: 6.2 g/dL (ref 6.0–8.5)

## 2021-04-13 ENCOUNTER — Other Ambulatory Visit: Payer: Self-pay

## 2021-04-13 MED ORDER — PAIN MANAGEMENT IT PUMP REFILL: 1.0000 | Freq: Once | INTRATHECAL | 0 refills | Status: AC

## 2021-04-13 NOTE — Progress Notes (Signed)
It pump  

## 2021-04-15 ENCOUNTER — Other Ambulatory Visit: Payer: Self-pay | Admitting: *Deleted

## 2021-04-15 DIAGNOSIS — C9000 Multiple myeloma not having achieved remission: Secondary | ICD-10-CM

## 2021-04-18 ENCOUNTER — Encounter: Payer: Self-pay | Admitting: Internal Medicine

## 2021-04-18 ENCOUNTER — Inpatient Hospital Stay: Payer: Medicare Other | Attending: Internal Medicine

## 2021-04-18 ENCOUNTER — Other Ambulatory Visit: Payer: Self-pay

## 2021-04-18 ENCOUNTER — Inpatient Hospital Stay: Payer: Medicare Other

## 2021-04-18 ENCOUNTER — Inpatient Hospital Stay (HOSPITAL_BASED_OUTPATIENT_CLINIC_OR_DEPARTMENT_OTHER): Payer: Medicare Other | Admitting: Internal Medicine

## 2021-04-18 VITALS — BP 121/75 | HR 70 | Resp 18

## 2021-04-18 DIAGNOSIS — C9000 Multiple myeloma not having achieved remission: Secondary | ICD-10-CM

## 2021-04-18 DIAGNOSIS — C9002 Multiple myeloma in relapse: Secondary | ICD-10-CM | POA: Insufficient documentation

## 2021-04-18 DIAGNOSIS — Z79899 Other long term (current) drug therapy: Secondary | ICD-10-CM | POA: Insufficient documentation

## 2021-04-18 DIAGNOSIS — Z5112 Encounter for antineoplastic immunotherapy: Secondary | ICD-10-CM | POA: Diagnosis present

## 2021-04-18 DIAGNOSIS — C9001 Multiple myeloma in remission: Secondary | ICD-10-CM

## 2021-04-18 LAB — CBC WITH DIFFERENTIAL/PLATELET
Abs Immature Granulocytes: 0.02 10*3/uL (ref 0.00–0.07)
Basophils Absolute: 0.1 10*3/uL (ref 0.0–0.1)
Basophils Relative: 1 %
Eosinophils Absolute: 0.2 10*3/uL (ref 0.0–0.5)
Eosinophils Relative: 2 %
HCT: 36.6 % — ABNORMAL LOW (ref 39.0–52.0)
Hemoglobin: 12.4 g/dL — ABNORMAL LOW (ref 13.0–17.0)
Immature Granulocytes: 0 %
Lymphocytes Relative: 23 %
Lymphs Abs: 1.6 10*3/uL (ref 0.7–4.0)
MCH: 34.6 pg — ABNORMAL HIGH (ref 26.0–34.0)
MCHC: 33.9 g/dL (ref 30.0–36.0)
MCV: 102.2 fL — ABNORMAL HIGH (ref 80.0–100.0)
Monocytes Absolute: 0.5 10*3/uL (ref 0.1–1.0)
Monocytes Relative: 6 %
Neutro Abs: 4.7 10*3/uL (ref 1.7–7.7)
Neutrophils Relative %: 68 %
Platelets: 200 10*3/uL (ref 150–400)
RBC: 3.58 MIL/uL — ABNORMAL LOW (ref 4.22–5.81)
RDW: 13.6 % (ref 11.5–15.5)
WBC: 7.1 10*3/uL (ref 4.0–10.5)
nRBC: 0 % (ref 0.0–0.2)

## 2021-04-18 LAB — COMPREHENSIVE METABOLIC PANEL
ALT: 16 U/L (ref 0–44)
AST: 23 U/L (ref 15–41)
Albumin: 3.8 g/dL (ref 3.5–5.0)
Alkaline Phosphatase: 42 U/L (ref 38–126)
Anion gap: 6 (ref 5–15)
BUN: 16 mg/dL (ref 8–23)
CO2: 23 mmol/L (ref 22–32)
Calcium: 8.4 mg/dL — ABNORMAL LOW (ref 8.9–10.3)
Chloride: 107 mmol/L (ref 98–111)
Creatinine, Ser: 1.23 mg/dL (ref 0.61–1.24)
GFR, Estimated: >60 mL/min (ref >60)
Glucose, Bld: 118 mg/dL — ABNORMAL HIGH (ref 70–99)
Potassium: 3.8 mmol/L (ref 3.5–5.1)
Sodium: 136 mmol/L (ref 135–145)
Total Bilirubin: 0.7 mg/dL (ref 0.3–1.2)
Total Protein: 6.1 g/dL — ABNORMAL LOW (ref 6.5–8.1)

## 2021-04-18 LAB — MAGNESIUM: Magnesium: 1.8 mg/dL (ref 1.7–2.4)

## 2021-04-18 MED ORDER — HEPARIN SOD (PORK) LOCK FLUSH 100 UNIT/ML IV SOLN
INTRAVENOUS | Status: AC
  Filled 2021-04-18: qty 5

## 2021-04-18 MED ORDER — SODIUM CHLORIDE 0.9 % IV SOLN
16.0000 mg/kg | Freq: Once | INTRAVENOUS | Status: AC
  Administered 2021-04-18: 1200 mg via INTRAVENOUS
  Filled 2021-04-18: qty 60

## 2021-04-18 MED ORDER — SODIUM CHLORIDE 0.9 % IV SOLN
Freq: Once | INTRAVENOUS | Status: AC
Start: 2021-04-18 — End: 2021-04-18
  Filled 2021-04-18: qty 250

## 2021-04-18 MED ORDER — SODIUM CHLORIDE 0.9% FLUSH
10.0000 mL | Freq: Once | INTRAVENOUS | Status: AC
  Administered 2021-04-18: 10 mL via INTRAVENOUS
  Filled 2021-04-18: qty 10

## 2021-04-18 NOTE — Assessment & Plan Note (Addendum)
#  Multiple myeloma: Status post autologous and cell transplant-s/p relapse currently on daratumumab- M spike fluctuates between 0.0 - 0.5 gm/dL in the past year. June 2022- M protein- NEG [IF; K/L 1.76]. ] PET scan on 08/14/2018 revealed no evidence of active myeloma. Bone survey on 11/10/2020 revealed no focal lytic lesions. There were stable compression fractions of T5, T10 and T12  #  proceed with daratumumab cycle #53 today. Labs today reviewed;  acceptable for treatment today. SQ dex; dexamethasone 24mg  SQ; on pre-meds [from home]  # PN/restless leg syndrome- [sec to thalilomid]- on baclofen 5-10 qhs. Recommend accupuncture   # Chronic diarrhea: [2-3 times a day]on immodium prn- STABLE.   # History of pulmonary embolism/ atrial fibrillation [Dr.Kowalski] on Eliquis  # CKD- stage III- STABLE.   Disposition: AM appts.  # Proceed with daratumumab today.  # follow up in 4 weeks- MD; labs [facility] with diff, CMP, Mg, MM panel;K/L ratio]- Dara IV-dr.B

## 2021-04-18 NOTE — Patient Instructions (Signed)
[facility]  Discharge Instructions: Thank you for choosing [facility] to provide your oncology and hematology care.  If you have a lab appointment with the [facility], please go directly to the [facility] and check in at the registration area.  Wear comfortable clothing and clothing appropriate for easy access to any Portacath or PICC line.   We strive to give you quality time with your provider. You may need to reschedule your appointment if you arrive late (15 or more minutes).  Arriving late affects you and other patients whose appointments are after yours.  Also, if you miss three or more appointments without notifying the office, you may be dismissed from the clinic at the provider's discretion.      For prescription refill requests, have your pharmacy contact our office and allow 72 hours for refills to be completed.    Today you received the following chemotherapy and/or immunotherapy agents DARZALEX      To help prevent nausea and vomiting after your treatment, we encourage you to take your nausea medication as directed.  BELOW ARE SYMPTOMS THAT SHOULD BE REPORTED IMMEDIATELY: *FEVER GREATER THAN 100.4 F (38 C) OR HIGHER *CHILLS OR SWEATING *NAUSEA AND VOMITING THAT IS NOT CONTROLLED WITH YOUR NAUSEA MEDICATION *UNUSUAL SHORTNESS OF BREATH *UNUSUAL BRUISING OR BLEEDING *URINARY PROBLEMS (pain or burning when urinating, or frequent urination) *BOWEL PROBLEMS (unusual diarrhea, constipation, pain near the anus) TENDERNESS IN MOUTH AND THROAT WITH OR WITHOUT PRESENCE OF ULCERS (sore throat, sores in mouth, or a toothache) UNUSUAL RASH, SWELLING OR PAIN  UNUSUAL VAGINAL DISCHARGE OR ITCHING   Items with * indicate a potential emergency and should be followed up as soon as possible or go to the Emergency Department if any problems should occur.  Please show the CHEMOTHERAPY ALERT CARD or IMMUNOTHERAPY ALERT CARD at check-in to  the Emergency Department and triage nurse.  Should you have questions after your visit or need to cancel or reschedule your appointment, please contact [facility]  [phone] and follow the prompts.  Office hours are 8:00 a.m. to 4:30 p.m. Monday - Friday. Please note that voicemails left after 4:00 p.m. may not be returned until the following business day.  We are closed weekends and major holidays. You have access to a nurse at all times for urgent questions. Please call the main number to the clinic [phone] and follow the prompts.  For any non-urgent questions, you may also contact your provider using MyChart. We now offer e-Visits for anyone 75 and older to request care online for non-urgent symptoms. For details visit [email or web address].   Also download the MyChart app! Go to the app store, search "MyChart", open the app, select [facility], and log in with your MyChart username and password.  Due to Covid, a mask is required upon entering the hospital/clinic. If you do not have a mask, one will be given to you upon arrival. For doctor visits, patients may have 1 support person aged 57 or older with them. For treatment visits, patients cannot have anyone with them due to current Covid guidelines and our immunocompromised population.   Daratumumab injection What is this medication? DARATUMUMAB (dar a toom ue mab) is a monoclonal antibody. It is used to treatmultiple myeloma. This medicine may be used for other purposes; ask your health care provider orpharmacist if you have questions. COMMON BRAND NAME(S): DARZALEX What should I tell my care team before I take this  medication? They need to know if you have any of these conditions: hereditary fructose intolerance infection (especially a virus infection such as chickenpox, herpes, or hepatitis B virus) lung or breathing disease (asthma, COPD) an unusual or allergic reaction to daratumumab,  sorbitol, other medicines, foods, dyes, or preservatives pregnant or trying to get pregnant breast-feeding How should I use this medication? This medicine is for infusion into a vein. It is given by a health careprofessional in a hospital or clinic setting. Talk to your pediatrician regarding the use of this medicine in children.Special care may be needed. Overdosage: If you think you have taken too much of this medicine contact apoison control center or emergency room at once. NOTE: This medicine is only for you. Do not share this medicine with others. What if I miss a dose? Keep appointments for follow-up doses as directed. It is important not to miss your dose. Call your doctor or health care professional if you are unable tokeep an appointment. What may interact with this medication? Interactions have not been studied. This list may not describe all possible interactions. Give your health care provider a list of all the medicines, herbs, non-prescription drugs, or dietary supplements you use. Also tell them if you smoke, drink alcohol, or use illegaldrugs. Some items may interact with your medicine. What should I watch for while using this medication? Your condition will be monitored carefully while you are receiving thismedicine. This medicine can cause serious allergic reactions. To reduce your risk, your health care provider may give you other medicine to take before receiving thisone. Be sure to follow the directions from your health care provider. This medicine can affect the results of blood tests to match your blood type. These changes can last for up to 6 months after the final dose. Your healthcare provider will do blood tests to match your blood type before you start treatment. Tell all of your healthcare providers that you are being treatedwith this medicine before receiving a blood transfusion. This medicine can affect the results of some tests used to determine treatmentresponse;  extra tests may be needed to evaluate response. Do not become pregnant while taking this medicine or for 3 months after stopping it. Women should inform their health care provider if they wish to become pregnant or think they might be pregnant. There is a potential for serious side effects to an unborn child. Talk to your health care provider formore information. Do not breast-feed an infant while taking this medicine. What side effects may I notice from receiving this medication? Side effects that you should report to your doctor or health care professionalas soon as possible: allergic reactions (skin rash, itching, hives, swelling of the face, lips, or tongue) blurred vision infection (fever, chills, cough, sore throat, pain or difficulty passing urine) infusion reaction (dizziness, fast heartbeat, feeling faint or lightheaded, falls, headache, increase in blood pressure, nausea, vomiting, or wheezing or trouble breathing with loud or whistling sounds) unusual bleeding or bruising Side effects that usually do not require medical attention (report to yourdoctor or health care professional if they continue or are bothersome): constipation diarrhea pain, tingling, numbness in the hands or feet swelling of the ankles, feet, hands tiredness This list may not describe all possible side effects. Call your doctor for medical advice about side effects. You may report side effects to FDA at1-800-FDA-1088. Where should I keep my medication? This drug is given in a hospital or clinic and will not be stored at home. NOTE: This sheet  is a summary. It may not cover all possible information. If you have questions about this medicine, talk to your doctor, pharmacist, orhealth care provider.  2022 Elsevier/Gold Standard (2020-11-04 12:50:38)

## 2021-04-18 NOTE — Progress Notes (Signed)
Pt states he took all pre-meds at home.

## 2021-04-18 NOTE — Progress Notes (Signed)
[facility] NOTE  Patient Care Team: Lequita Asal, MD as PCP - General (Hematology and Oncology) Corey Skains, MD as Consulting Physician (Internal Medicine) Crissie Sickles, MD as Referring Physician (Hematology and Oncology) Lucilla Lame, MD as Consulting Physician (Gastroenterology) Milinda Pointer, MD as Referring Physician (Pain Medicine) Milinda Pointer, MD as Referring Physician (Pain Medicine) Cammie Sickle, MD as Consulting Physician (Hematology and Oncology)  CHIEF COMPLAINTS/PURPOSE OF CONSULTATION: MULTIPLE MYELOMA  #  Oncology History Overview Note  with stage III mutiple myeloma.  He initially presented with progressive back pain beginning in 12/2006.  MRI revealed "spots and compression fractures".  He began Velcade, thalidomide, and Decadron.  In 08/2007, he underwent high dose chemotherapy and autologous stem cell transplant.  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   He recurred with a rising M-spike (2.7) with repeat M spike (1.7 gm/dl) in 03/2010.  He was initially treated with Velcade (02/08/2010 - 05/10/2010).  He then began Revlimid (15 mg 3 weeks on/1 week off) and Decadron (40 mg on day 1, 8, 15, 22).  Because of significant side effect with Decadron his dose was decreased to 10 mg once a week in 07/2010.     He was on maintenance Revlimid.  Revlimid was initially10 mg 3 weeks on/1 week off.  This was changed to 10 mg 2 weeks on/2 weeks off secondary to right nipple tenderness.  His dose was increased to 10 mg 3 weeks on/1 week off with Decadron 10 mg a week (on Sundays) and then Revlamid 15 mg 3 weeks on and 1 week off with Decadron on Sundays.  He began Pomalyst 4 mg 3 weeks on/1 week off with Decadron on 08/27/2015.   SPEP revealed no monoclonal protein (04/21/2015) and 0.5 gm/dL on 09/22/2015 and 10/20/2015.  M spike was 0.1 on 02/02/2016, 03/01/2016, 03/29/2016, 04/26/2016, and 05/24/2016.  M  spike was 0 on 10/11/2016, 11/28/2016, 02/05/2017, 03/21/2017, 08/02/2017, 10/04/2017, 12/27/2017, 01/24/2018, and 02/21/2018.  M spike was 0.1 on 11/01/2017, 0.1 on 11/29/2017, 0 on 12/27/2017, 0 on 01/24/2018, 0 on 02/21/2018, 0.1 on 03/21/2018, 0.3 on 04/18/2018, 0.1 on 05/16/2018, 0 on 06/13/2018, 0.2 on 08/08/2018, 0 on 09/09/2018, 0.1 on 11/04/2018, 0 on 12/02/2018, 0.1 on 12/30/2018, 0 on 01/27/2019, 0.1 on 02/24/2019, 0.1 on 03/24/2019, 0.1 on 04/23/2019, 0 on 05/19/2019, 0.1 on 06/17/2019, 0.2 on 07/15/2019, 0.2 on 08/18/2019, 0.2 on 09/15/2019, 0.1 on 10/13/2019, 0.1 on 11/13/2019, 0 on 12/11/2019, 0.3 on 01/08/2020, 0.2 on 02/05/2020, 0.2 on 03/04/2020, 0.2 on 04/01/2020, 0 on 05/05/2020, 0.4 on 06/07/2020, 0.4 on 07/06/2020, 0.3 on 08/03/2020, 0.1 on 08/31/2020, 0.3 on 09/28/2020, and 0.2 on 10/26/2020.    Free light chains have been monitored.  Kappa free light chains were 18.54 on 11/21/2013, 18.37 on 02/20/2014, 18.93 on 05/22/2014, 32.58 (high; normal ratio 1.27) on 08/21/2014, 51.53 (high; elevated ratio of 2.12) on 11/13/2014, 28.08 (ratio 1.73) on 12/09/2014, 23.71 (ratio 2.17) on 01/18/2015, 92.93 (ratio 9.49) on 04/21/2015, 93.44 (ratio 10.28) on 05/26/2015, 255.45 (ratio of 24.05) on 07/14/2015, 373.89 (ratio 48.31) on 08/04/2015, 474.33 (ratio 70.58) on 08/25/2015, 450.76 (ratio 55.44) on 09/22/2015, 453.4 (ratio 59.89) on 10/20/2015, 58.26 (ratio of > 40.74) on 02/02/2016, 62.67 (ratio of 37.98) on 03/01/2016, 75.7 (ratio > 50.47) on 03/29/2016, 79.7 (ratio 53.13) on 04/26/2016, 108.6 (ratio > 72.4) on 05/24/2016, 8.3 (1.46 ratio) on 10/11/2016, 6.7 (0.81 ratio) on 02/05/2017, 7.8 (1.01 ratio) on 03/21/2017,7.6 (ratio 0.63) on 06/01/2017, 8.1 (ratio 1.13) on 11/29/2017, 9.6 (ratio 1.55)  on 06/13/2018, 6.6 (ratio 2.06) on 07/11/2018, 6.9 (ratio 0.82) on 08/08/2018, 5.8 (ratio 1.66) on 09/09/2018, 5.8 (ratio 1.05) on 11/04/2018, 5.6 (ratio 2.55) on 12/02/2018, 5.5 (ratio 1.96) on 01/27/2019,  7.6 (ratio 2.38) on 06/17/2019, 6.9 (ratio 1.21) on 11/13/2019, 6.6 (ratio 2.0) on 12/11/2019, 7.9 (ratio 2.14) on 02/05/2020, 6.6 (ratio 2.28) on 04/01/2020, 7.0 (ratio 2.12) on 05/05/2020, 9.3 (ratio 2.16) on 07/06/2020,  7.1 (ratio 2.09) on 08/31/2020, and 6.9 (ratio 2.09) on 10/26/2020.   Bone survey on 12/08/2014 was stable.  Bone survey on 10/21/2015 revealed increase conspicuity of subcentimeter lytic lesions in the calvarium.  PET scan on 08/14/2018 revealed no evidence of active myeloma.  Bone survey on 11/04/2019 revealed stable small lytic lesions throughout the skull.  There were no other lytic lesions.   Bone marrow aspirate and biopsy on 11/04/2015 revealed an atypical monoclonal plasma cells estimated at 30-40% of marrow cells.   Marrow was variably cellular (approximately 45%) with background trilineage hematopoiesis. There was no significant increase in marrow reticulin fibers. Storage iron was present.     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  He develoed herpes zoster in 04/2008.  He developed a pulmonary embolism in 05/2013.  He was initially on Xarelto, but is now on Eliquis.  He had an episode of pneumonia around this time requiring a brief admission.  He developed severe lower leg cramps on 08/07/2014 secondary to hypokalemia.  Duplex was negative.   He was treated for C difficile colitis (Flagyl completed 07/30/2015).  He has a chronic indwelling pain pump.   He received 4 cycles of Pomalyst and Decadron (08/27/2015 - 11/19/2015).  Restaging studies document progressive disease.  Kappa free light chains are increasing.  SPEP revealed 0.5 gm/dL monoclonal protein then 1.3 gm/dL.  Bone survey reveals increase conspicuity of subcentimeter lytic lesions in the calvarium.  Bone marrow reveals 30-40% plasma cells.   MUGA on 11/30/2015 revealed an ejection fraction of 46%.  He is felt not to be a good candidate for Kyprolis.   There were no focal wall motion abnormalities. He had a stress echo less than 1 year ago.  He has a history of PVCs and atrial fibrillation.  He takes Cardizem prn.   He received 17 weeks of daratumumab (Darzalex) (12/09/2015 - 05/25/2016).  He tolerated treatment well without side effect.   Bone marrow on 02/09/2016 revealed no diagnostic morphologic evidence of plasma cell myeloma.  Marrow was normocellular to hypocellular marrow for age (ranging from 10-40%) with maturing trilineage hematopoiesis and mild multilineage dyspoiesis.  There was patchy mild increase in reticulin.  Storage iron was present.  Flow cytometry revealed no definitive evidence of monoclonality.  There was a non-specific atypical myeloid and monocytic findings with no increase in blasts.  Cytogenetics were normal (46, XY).   He is currently 54 months s/p 2nd autologous stem cell transplant on 06/16/2016.  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  He also experienced atrial fibrillation with intermittent episodes of RVR requiring IV beta blockers.  He is on prophylactic valacyclovir for 1 year post transplant.   He started vaccinations (DTaP-Pediatric triple vaccine, Hep B- Pediatrix triple vaccine, Haemophilus influenza B (Hib), inactivated polio virus (IPV), pneumococcal conjugate vaccine 13-valent (PCV 13)) on 04/17/2017.  Recommendation was for Shingrix vaccine to be given in [place] and second dose of Shingrix in 2 months.  He had follow-up vaccinations on 08/28/2017.  He had his  second shingles vaccine. He received 18 month vaccinations - DTaP-Pediatric triple vaccine, Hep B- Pediatrix triple vaccine, Haemophilus influenza B (Hib), inactivated polio virus (IPV), pneumococcal conjugate vaccine 13-valent (PCV 13) on 02/05/2018.   He is s/p 52 cycles of daratumumab (Darzalex) post transplant (12/05/2016 - 12/27/20).   PET scan on 08/14/2018 revealed no evidence of active  myeloma on whole-body FDG PET scan.  There was no evidence soft tissue plasmacytoma.  There was no clear evidence of lytic lesions on the CT portion exam. Some lucencies in the pelvis were stable. There were chronic compression fractures in the lower thoracic spine.  Bone survey on 11/04/2019 revealed stable small lytic lesions noted throughout the skull.  There were no other lytic lesions. Exam was stable.  Bone survey on 11/10/2020 revealed no focal lytic lesions.  There are stable compression fractures of T5, T10 and T12.    He has chronic diarrhea. GI panel by PCR was + enteropathogenic E coli (EPEC) on 11/30/2020 and 12/13/2020.  Stool was + for adenovirus on 11/30/2020.  Stool was negative for EPEC on 12/22/2020.  He was treated with ciprofloxacin (last 12/21/2020).   He has a history of hypomagnesemia that required IV magnesium (2-4 gm) weekly.  He has not required magnesium since 10/24/2016.  Patient has atrial fibrillation and is on Eliquis.   Multiple myeloma in remission ([place])  09/19/2016 Initial Diagnosis   Multiple myeloma in remission ([place])      HISTORY OF PRESENTING ILLNESS:  Logan Perez 75 y.o.  male multiple myeloma currently on daratumumab/Dex is here for follow up.  Pt has chronic back pain; not any worse. Denies any dyspnea or cough. Chronic tingling and numbness in the extremities.   Review of Systems  Constitutional:  Negative for chills, diaphoresis, fever, malaise/fatigue and weight loss.  HENT:  Negative for nosebleeds and sore throat.   Eyes:  Negative for double vision.  Respiratory:  Negative for cough, hemoptysis, sputum production, shortness of breath and wheezing.   Cardiovascular:  Negative for chest pain, palpitations, orthopnea and leg swelling.  Gastrointestinal:  Negative for abdominal pain, blood in stool, constipation, diarrhea, heartburn, melena, nausea and vomiting.  Genitourinary:  Negative for dysuria, frequency and urgency.   Musculoskeletal:  Positive for back pain and joint pain.  Skin: Negative.  Negative for itching and rash.  Neurological:  Positive for tingling. Negative for dizziness, focal weakness, weakness and headaches.  Endo/Heme/Allergies:  Does not bruise/bleed easily.  Psychiatric/Behavioral:  Negative for depression. The patient is not nervous/anxious and does not have insomnia.     MEDICAL HISTORY:  Past Medical History:  Diagnosis Date   Anxiety    Atrial fibrillation (HCC)    BPH (benign prostatic hyperplasia)    Complication of anesthesia    BAD HEADACHE NIGHT OF FIRST CATARACT   Compression fracture of lumbar vertebra (HCC)    Difficulty voiding    Dysrhythmia    A FIB   Elevated PSA    GERD (gastroesophageal reflux disease)    Hearing aid worn    bilateral   History of kidney stones    HLD (hyperlipidemia)    HOH (hard of hearing)    Hypertension    Multiple myeloma (HCC)    Neuropathy    feet. 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   Pain    BACK   Palpitations    Pneumonia    Pulmonary embolism (HCC)    Sepsis (HCC)    Stroke (HCC)    TIA, detected on  CT scan. pt was unaware    SURGICAL HISTORY: Past Surgical History:  Procedure Laterality Date   BACK SURGERY  1994   CATARACT EXTRACTION W/PHACO Right 05/02/2016   Procedure: CATARACT EXTRACTION PHACO AND INTRAOCULAR LENS PLACEMENT ([place]);  Surgeon: Birder Robson, MD;  Location: [facility];  Service: Ophthalmology;  Laterality: Right;  [place] 1.06 AP% 20.6 CDE 13.70 FLUID PACK LOT # [id number] H   CATARACT EXTRACTION W/PHACO Left 05/16/2016   Procedure: CATARACT EXTRACTION PHACO AND INTRAOCULAR LENS PLACEMENT ([facility]);  Surgeon: Birder Robson, MD;  Location: [facility];  Service: Ophthalmology;  Laterality: Left;  [place] 01:43 AP% 19.8 CDE 20.45 FLUID PACK LOT #[id number] H   COLONOSCOPY WITH PROPOFOL N/A 03/22/2017   Procedure: COLONOSCOPY WITH PROPOFOL;  Surgeon: Lucilla Lame, MD;  Location: [facility];  Service: Endoscopy;  Laterality:  N/A;  has port   ESOPHAGOGASTRODUODENOSCOPY (EGD) WITH PROPOFOL  03/22/2017   Procedure: ESOPHAGOGASTRODUODENOSCOPY (EGD) WITH PROPOFOL;  Surgeon: Lucilla Lame, MD;  Location: [facility];  Service: Endoscopy;;   EYE SURGERY     KNEE ARTHROSCOPY Left 1992   LIMBAL STEM CELL TRANSPLANT     PAIN PUMP IMPLANTATION  2012   PAIN PUMP IMPLANTATION N/A 09/04/2017   Procedure: INTRATHECAL PUMP BATTERY CHANGE;  Surgeon: Milinda Pointer, MD;  Location: [facility];  Service: Neurosurgery;  Laterality: N/A;   PORTA CATH INSERTION N/A 12/04/2016   Procedure: Glori Luis Cath Insertion;  Surgeon: Algernon Huxley, MD;  Location: [facility];  Service: Cardiovascular;  Laterality: N/A;   stem cell implant  2008   [facility]    SOCIAL HISTORY: Social History   Socioeconomic History   Marital status: Married    Spouse name: Not on file   Number of children: Not on file   Years of education: Not on file   Highest education level: Not on file  Occupational History   Not on file  Tobacco Use   Smoking status: Former    Packs/day: 2.50    Years: 12.00    Pack years: 30.00    Types: Cigarettes    Quit date: 28    Years since quitting: 45.5   Smokeless tobacco: Never   Tobacco comments:    Quit 40 years ago  Electronics engineer Use   Vaping Use: Never used  Substance and Sexual Activity   Alcohol use: No    Alcohol/week: 0.0 standard drinks    Comment: might have 1 beer/month   Drug use: No   Sexual activity: Not on file  Other Topics Concern   Not on file  Social History Narrative   Not on file   Social Determinants of Health   Financial Resource Strain: Not on file  Food Insecurity: Not on file  Transportation Needs: Not on file  Physical Activity: Not on file  Stress: Not on file  Social Connections: Not on file  Intimate Partner Violence: Not on file    FAMILY HISTORY: Family History  Problem Relation Age of Onset   Cancer Father        throat   Kidney disease Sister    Stroke  Other    Stroke Mother    Bladder Cancer Neg Hx    Prostate cancer Neg Hx    Kidney cancer Neg Hx     ALLERGIES:  is allergic to ciprofloxacin, azithromycin, bee pollen, pollen extract, zoledronic acid, and rivaroxaban.  MEDICATIONS:  Current Outpatient Medications  Medication Sig Dispense Refill   acetaminophen (TYLENOL) 500 MG tablet Take 500 mg  by mouth 2 (two) times a day.     amoxicillin (AMOXIL) 500 MG capsule PLEASE SEE ATTACHED FOR DETAILED DIRECTIONS     baclofen (LIORESAL) 10 MG tablet Take 1 tablet (10 mg total) by mouth 2 (two) times daily. 180 tablet 0   calcium citrate-vitamin D (CITRACAL+D) 315-200 MG-UNIT tablet Take 1 tablet by mouth 2 (two) times daily.     cetirizine (ZYRTEC) 10 MG tablet Take 10 mg by mouth daily as needed for allergies.      Cholecalciferol (VITAMIN D3) 2000 units capsule Take 2,000 Units daily by mouth.      Daratumumab (DARZALEX IV) Inject 1,200 mg every 30 (thirty) days into the vein.      diltiazem (CARDIZEM CD) 120 MG 24 hr capsule Take 120 mg daily by mouth.      diltiazem (CARDIZEM) 60 MG tablet Take by mouth.     diltiazem (TIAZAC) 120 MG 24 hr capsule Take by mouth.     diphenhydrAMINE (BENADRYL) 25 MG tablet Take 25 mg by mouth as directed. With chemo treatment     ELIQUIS 5 MG TABS tablet TAKE 1 TABLET TWICE A DAY 180 tablet 1   fluticasone (FLONASE) 50 MCG/ACT nasal spray Place 1 spray into the nose daily as needed for allergies.     loperamide (IMODIUM) 2 MG capsule Take 4 mg by mouth as needed for diarrhea or loose stools.     loratadine (CLARITIN) 10 MG tablet Take 10 mg by mouth daily as needed.      lovastatin (MEVACOR) 20 MG tablet Take 20 mg by mouth every evening.      montelukast (SINGULAIR) 10 MG tablet Take 1 tablet by mouth on the day before, day of, and 2 days after treatment. 30 tablet 1   Multiple Vitamins-Iron (MULTIVITAMIN/IRON PO) Take 1 tablet by mouth daily.      NON FORMULARY IT pump  Fentanyl 1,500.0  mcg/ml Bupivicaine 30.0 mg/ml Clonidine 300.0 mcg/ml Rate 500.4 mg/day     omeprazole (PRILOSEC) 20 MG capsule Take 20 mg by mouth daily.      ondansetron (ZOFRAN) 8 MG tablet Take 1 tablet (8 mg total) by mouth as directed. Take with chemo and can take it as needed 20 tablet 1   PAIN MANAGEMENT INTRATHECAL, IT, PUMP 1 each by Intrathecal route. Intrathecal (IT) medication:  Fentanyl 1500 mcg/ml, bupivicaine 30.0 mg/ml, clonidine 300.0 mcg/ml Daily dose fent,.500.22mg/day, bupivacaine 10.0038mday, clonidine 1000.07 mcg/day     tamsulosin (FLOMAX) 0.4 MG CAPS capsule TAKE 1 CAPSULE BY MOUTH DAILY 90 capsule 2   valACYclovir (VALTREX) 500 MG tablet TAKE 1 TABLET DAILY 90 tablet 3   vitamin B-12 (CYANOCOBALAMIN) 1000 MCG tablet Take 1,000 mcg daily by mouth.      bismuth subsalicylate (PEPTO BISMOL) 262 MG/15ML suspension Take 30 mLs by mouth every 6 (six) hours as needed for diarrhea or loose stools. (Patient not taking: No sig reported)     dexamethasone (DECADRON) 4 MG tablet TAKE 4 TABLETS BY MOUTH 1 HOUR PRIOR TO INFUSION, THEN 1 TABLET ON DAYS 2 AND 3 AS DIRECTED 30 tablet 0   diltiazem (CARDIZEM) 60 MG tablet Take 60 mg by mouth daily as needed (for increased heart rate > 140). (Patient not taking: No sig reported)     Hypromellose (ARTIFICIAL TEARS OP) Place 1 drop as needed into both eyes (for dry eyes). (Patient not taking: No sig reported)     naloxone (NARCAN) 2 MG/2ML injection Inject 1 mL (1 mg total) into  the muscle as needed for up to 2 doses (for opioid overdose). In case of emergency (overdose), inject into muscle of upper arm or leg and call 911. (Patient not taking: No sig reported) 2 mL 1   potassium chloride SA (KLOR-CON) 20 MEQ tablet TAKE 1 TABLET(20 MEQ) BY MOUTH DAILY (Patient not taking: No sig reported) 90 tablet 0   No current facility-administered medications for this visit.      [name]  PHYSICAL EXAMINATION: ECOG PERFORMANCE STATUS: 1 - Symptomatic but completely  ambulatory  Vitals:   04/18/21 0832  BP: 135/76  Pulse: 81  Resp: 18  Temp: (!) 96.9 F (36.1 C)  SpO2: 100%   Filed Weights   04/18/21 0832  Weight: 165 lb 6.4 oz (75 kg)    Physical Exam Vitals and nursing note reviewed.  Constitutional:      Comments: Patient is alone. Ambulating independently.   HENT:     Head: Normocephalic and atraumatic.     Mouth/Throat:     Mouth: Mucous membranes are moist.     Pharynx: No oropharyngeal exudate.  Eyes:     Pupils: Pupils are equal, round, and reactive to light.  Cardiovascular:     Rate and Rhythm: Normal rate and regular rhythm.  Pulmonary:     Effort: No respiratory distress.     Breath sounds: No wheezing.     Comments: Decreased breath sounds bilaterally at bases.  No wheeze or crackles Abdominal:     General: Bowel sounds are normal. There is no distension.     Palpations: Abdomen is soft. There is no mass.     Tenderness: There is no abdominal tenderness. There is no guarding or rebound.  Musculoskeletal:        General: No tenderness. Normal range of motion.     Cervical back: Normal range of motion and neck supple.  Skin:    General: Skin is warm.  Neurological:     Mental Status: He is alert and oriented to person, place, and time.  Psychiatric:        Mood and Affect: Affect normal.        Judgment: Judgment normal.    LABORATORY DATA:  I have reviewed the data as listed Lab Results  Component Value Date   WBC 7.1 04/18/2021   HGB 12.4 (L) 04/18/2021   HCT 36.6 (L) 04/18/2021   MCV 102.2 (H) 04/18/2021   PLT 200 04/18/2021   Recent Labs    05/05/20 0834 06/07/20 0921 07/06/20 0835 08/03/20 0814 01/24/21 0822 02/21/21 0851 04/18/21 0819  NA 137 137 137   < > 138 135 136  K 4.3 5.2* 3.8   < > 3.6 3.9 3.8  CL 105 102 104   < > 105 103 107  CO2 '22 24 25   ' < > '22 22 23  ' GLUCOSE 131* 85 103*   < > 161* 145* 118*  BUN '19 21 17   ' < > '16 21 16  ' CREATININE 1.48* 1.53* 1.17   < > 1.52* 1.41* 1.23   CALCIUM 8.7* 9.2 8.8*   < > 8.9 8.8* 8.4*  GFRNONAA 46* 44* >60   < > 48* 52* >60  GFRAA 53* 51* >60  --   --   --   --   PROT 6.2* 6.5 6.4*   < > 6.2* 6.0* 6.1*  ALBUMIN 4.0 4.1 4.0   < > 3.9 3.7 3.8  AST '23 17 20   ' < > 28  24 23  ALT '15 14 16   ' < > '13 14 16  ' ALKPHOS 44 46 38   < > 46 40 42  BILITOT 0.6 0.5 0.7   < > 0.8 0.8 0.7   < > = values in this interval not displayed.    RADIOGRAPHIC STUDIES: I have personally reviewed the radiological images as listed and agreed with the findings in the report. No results found.  ASSESSMENT & PLAN:   Multiple myeloma in remission ([place]) # Multiple myeloma: Status post autologous and cell transplant-s/p relapse currently on daratumumab- M spike fluctuates between 0.0 - 0.5 gm/dL in the past year. June 2022- M protein- NEG [IF; K/L 1.76]. ] PET scan on 08/14/2018 revealed no evidence of active myeloma. Bone survey on 11/10/2020 revealed no focal lytic lesions. There were stable compression fractions of T5, T10 and T12  #  proceed with daratumumab cycle #53 today. Labs today reviewed;  acceptable for treatment today. SQ dex; dexamethasone 31m SQ; on pre-meds [from home]  # PN/restless leg syndrome- [sec to thalilomid]- on baclofen 5-10 qhs. Recommend accupuncture    # Chronic diarrhea: [2-3 times a day] on immodium prn- STABLE.   # History of pulmonary embolism/ atrial fibrillation [Dr.Kowalski] on Eliquis   # CKD- stage III- STABLE.    Disposition: AM appts.  # Proceed with daratumumab today.  # follow up in 4 weeks- MD; [facility], [facility], MM panel;K/L ratio]- [facility]ara IV-dr.B  All questions were answered. The patient knows to call the clinic with any problems, questions or concerns.    GCammie Sickle MD 04/24/2021 10:33 PM

## 2021-04-19 LAB — KAPPA/LAMBDA LIGHT CHAINS
Kappa free light chain: 6.3 mg/L (ref 3.3–19.4)
Kappa, lambda light chain ratio: 1.85 — ABNORMAL HIGH (ref 0.26–1.65)
Lambda free light chains: 3.4 mg/L — ABNORMAL LOW (ref 5.7–26.3)

## 2021-04-20 ENCOUNTER — Encounter: Payer: Self-pay | Admitting: Hematology and Oncology

## 2021-04-20 MED ORDER — DEXAMETHASONE 4 MG PO TABS: ORAL_TABLET | ORAL | 0 refills | Status: AC

## 2021-04-24 ENCOUNTER — Encounter: Payer: Self-pay | Admitting: Hematology and Oncology

## 2021-04-25 LAB — MULTIPLE MYELOMA PANEL, SERUM
Albumin SerPl Elph-Mcnc: 3.8 g/dL (ref 2.9–4.4)
Albumin/Glob SerPl: 1.7 (ref 0.7–1.7)
Alpha 1: 0.1 g/dL (ref 0.0–0.4)
Alpha2 Glob SerPl Elph-Mcnc: 0.9 g/dL (ref 0.4–1.0)
B-Globulin SerPl Elph-Mcnc: 0.8 g/dL (ref 0.7–1.3)
Gamma Glob SerPl Elph-Mcnc: 0.4 g/dL (ref 0.4–1.8)
Globulin, Total: 2.3 g/dL (ref 2.2–3.9)
IgA: 20 mg/dL — ABNORMAL LOW (ref 61–437)
IgG (Immunoglobin G), Serum: 217 mg/dL — ABNORMAL LOW (ref 603–1613)
IgM (Immunoglobulin M), Srm: 27 mg/dL (ref 15–143)
M Protein SerPl Elph-Mcnc: 0.1 g/dL — ABNORMAL HIGH
Total Protein ELP: 6.1 g/dL (ref 6.0–8.5)

## 2021-05-16 ENCOUNTER — Encounter: Payer: Self-pay | Admitting: Internal Medicine

## 2021-05-16 ENCOUNTER — Inpatient Hospital Stay: Payer: Medicare Other

## 2021-05-16 ENCOUNTER — Inpatient Hospital Stay: Payer: Medicare Other | Attending: Internal Medicine | Admitting: Internal Medicine

## 2021-05-16 DIAGNOSIS — Z95828 Presence of other vascular implants and grafts: Secondary | ICD-10-CM | POA: Diagnosis not present

## 2021-05-16 DIAGNOSIS — Z451 Encounter for adjustment and management of infusion pump: Secondary | ICD-10-CM | POA: Diagnosis not present

## 2021-05-16 DIAGNOSIS — M545 Low back pain, unspecified: Secondary | ICD-10-CM | POA: Diagnosis not present

## 2021-05-16 DIAGNOSIS — G893 Neoplasm related pain (acute) (chronic): Secondary | ICD-10-CM | POA: Diagnosis not present

## 2021-05-16 DIAGNOSIS — Z7901 Long term (current) use of anticoagulants: Secondary | ICD-10-CM | POA: Diagnosis not present

## 2021-05-16 DIAGNOSIS — Z5112 Encounter for antineoplastic immunotherapy: Secondary | ICD-10-CM | POA: Insufficient documentation

## 2021-05-16 DIAGNOSIS — G894 Chronic pain syndrome: Secondary | ICD-10-CM | POA: Insufficient documentation

## 2021-05-16 DIAGNOSIS — C9001 Multiple myeloma in remission: Secondary | ICD-10-CM | POA: Insufficient documentation

## 2021-05-16 DIAGNOSIS — Z978 Presence of other specified devices: Secondary | ICD-10-CM | POA: Diagnosis not present

## 2021-05-16 DIAGNOSIS — N183 Chronic kidney disease, stage 3 unspecified: Secondary | ICD-10-CM | POA: Insufficient documentation

## 2021-05-16 DIAGNOSIS — Z5189 Encounter for other specified aftercare: Secondary | ICD-10-CM | POA: Insufficient documentation

## 2021-05-16 DIAGNOSIS — Z79899 Other long term (current) drug therapy: Secondary | ICD-10-CM | POA: Insufficient documentation

## 2021-05-16 DIAGNOSIS — Z79891 Long term (current) use of opiate analgesic: Secondary | ICD-10-CM | POA: Insufficient documentation

## 2021-05-16 DIAGNOSIS — C9002 Multiple myeloma in relapse: Secondary | ICD-10-CM

## 2021-05-16 DIAGNOSIS — I4891 Unspecified atrial fibrillation: Secondary | ICD-10-CM | POA: Insufficient documentation

## 2021-05-16 DIAGNOSIS — S22080S Wedge compression fracture of T11-T12 vertebra, sequela: Secondary | ICD-10-CM | POA: Diagnosis not present

## 2021-05-16 LAB — COMPREHENSIVE METABOLIC PANEL
ALT: 14 U/L (ref 0–44)
AST: 26 U/L (ref 15–41)
Albumin: 3.8 g/dL (ref 3.5–5.0)
Alkaline Phosphatase: 51 U/L (ref 38–126)
Anion gap: 11 (ref 5–15)
BUN: 20 mg/dL (ref 8–23)
CO2: 22 mmol/L (ref 22–32)
Calcium: 8.8 mg/dL — ABNORMAL LOW (ref 8.9–10.3)
Chloride: 101 mmol/L (ref 98–111)
Creatinine, Ser: 1.42 mg/dL — ABNORMAL HIGH (ref 0.61–1.24)
GFR, Estimated: 52 mL/min — ABNORMAL LOW (ref >60)
Glucose, Bld: 161 mg/dL — ABNORMAL HIGH (ref 70–99)
Potassium: 3.6 mmol/L (ref 3.5–5.1)
Sodium: 134 mmol/L — ABNORMAL LOW (ref 135–145)
Total Bilirubin: 0.5 mg/dL (ref 0.3–1.2)
Total Protein: 6.1 g/dL — ABNORMAL LOW (ref 6.5–8.1)

## 2021-05-16 LAB — CBC WITH DIFFERENTIAL/PLATELET
Abs Immature Granulocytes: 0.02 10*3/uL (ref 0.00–0.07)
Basophils Absolute: 0 10*3/uL (ref 0.0–0.1)
Basophils Relative: 1 %
Eosinophils Absolute: 0.1 10*3/uL (ref 0.0–0.5)
Eosinophils Relative: 2 %
HCT: 37 % — ABNORMAL LOW (ref 39.0–52.0)
Hemoglobin: 12.6 g/dL — ABNORMAL LOW (ref 13.0–17.0)
Immature Granulocytes: 0 %
Lymphocytes Relative: 22 %
Lymphs Abs: 1.8 10*3/uL (ref 0.7–4.0)
MCH: 34.3 pg — ABNORMAL HIGH (ref 26.0–34.0)
MCHC: 34.1 g/dL (ref 30.0–36.0)
MCV: 100.8 fL — ABNORMAL HIGH (ref 80.0–100.0)
Monocytes Absolute: 0.5 10*3/uL (ref 0.1–1.0)
Monocytes Relative: 6 %
Neutro Abs: 5.6 10*3/uL (ref 1.7–7.7)
Neutrophils Relative %: 69 %
Platelets: 216 10*3/uL (ref 150–400)
RBC: 3.67 MIL/uL — ABNORMAL LOW (ref 4.22–5.81)
RDW: 13.4 % (ref 11.5–15.5)
WBC: 8.1 10*3/uL (ref 4.0–10.5)
nRBC: 0 % (ref 0.0–0.2)

## 2021-05-16 LAB — MAGNESIUM: Magnesium: 1.9 mg/dL (ref 1.7–2.4)

## 2021-05-16 MED ORDER — SODIUM CHLORIDE 0.9 % IV SOLN
Freq: Once | INTRAVENOUS | Status: AC
  Filled 2021-05-16: qty 250

## 2021-05-16 MED ORDER — SODIUM CHLORIDE 0.9% FLUSH
10.0000 mL | Freq: Once | INTRAVENOUS | Status: AC
  Administered 2021-05-16: 10 mL via INTRAVENOUS
  Filled 2021-05-16: qty 10

## 2021-05-16 MED ORDER — SODIUM CHLORIDE 0.9 % IV SOLN
16.0000 mg/kg | Freq: Once | INTRAVENOUS | Status: AC
  Administered 2021-05-16: 1200 mg via INTRAVENOUS
  Filled 2021-05-16: qty 60

## 2021-05-16 MED ORDER — HEPARIN SOD (PORK) LOCK FLUSH 100 UNIT/ML IV SOLN
500.0000 [IU] | Freq: Once | INTRAVENOUS | Status: AC
  Administered 2021-05-16: 500 [IU] via INTRAVENOUS
  Filled 2021-05-16: qty 5

## 2021-05-16 NOTE — Assessment & Plan Note (Signed)
#  Multiple myeloma: Status post autologous and cell transplant-s/p relapse currently on daratumumab- M spike fluctuates between 0.0 - 0.5 gm/dL in the past year. June 2022- M protein- NEG [IF; K/L 1.76]. ] PET scan on 08/14/2018 revealed no evidence of active myeloma. Bone survey on 11/10/2020 revealed no focal lytic lesions. There were stable compression fractions of T5, T10 and T12. July 2022- M protein-01.; K/l- 1.85- STABLE.    #  proceed with daratumumab today. Labs today reviewed;  acceptable for treatment today. SQ dex; dexamethasone 24mg weekly on pre-meds [from home]  # PN/restless leg syndrome- [sec to thalilomid]- on baclofen 5-10 qhs. Recommend accupuncture   # Chronic diarrhea: [2-3 times a day]on immodium prn- STABLE.   # History of pulmonary embolism/ atrial fibrillation [Dr.Kowalski] on Eliquis- STABLE.   # CKD- stage III- STABLE.   # Compression fractures sec to MM- s/p Pain pump [Dr.Naveira; 2012]  # DISPOSITION: AM appts- [place] appt # Proceed with daratumumab today.  # follow up in 4 weeks- MD; labs [CBC with diff, CMP, Mg, MM panel;K/L ratio]- [facility]ara IV-dr.B 

## 2021-05-16 NOTE — Patient Instructions (Signed)
[facility]  Discharge Instructions: Thank you for choosing [facility] to provide your oncology and hematology care.  If you have a lab appointment with the [facility], please go directly to the [facility] and check in at the registration area.  Wear comfortable clothing and clothing appropriate for easy access to any Portacath or PICC line.   We strive to give you quality time with your provider. You may need to reschedule your appointment if you arrive late (15 or more minutes).  Arriving late affects you and other patients whose appointments are after yours.  Also, if you miss three or more appointments without notifying the office, you may be dismissed from the clinic at the provider's discretion.      For prescription refill requests, have your pharmacy contact our office and allow 72 hours for refills to be completed.    Today you received the following chemotherapy and/or immunotherapy agents: Daratumumab    To help prevent nausea and vomiting after your treatment, we encourage you to take your nausea medication as directed.  BELOW ARE SYMPTOMS THAT SHOULD BE REPORTED IMMEDIATELY: *FEVER GREATER THAN 100.4 F (38 C) OR HIGHER *CHILLS OR SWEATING *NAUSEA AND VOMITING THAT IS NOT CONTROLLED WITH YOUR NAUSEA MEDICATION *UNUSUAL SHORTNESS OF BREATH *UNUSUAL BRUISING OR BLEEDING *URINARY PROBLEMS (pain or burning when urinating, or frequent urination) *BOWEL PROBLEMS (unusual diarrhea, constipation, pain near the anus) TENDERNESS IN MOUTH AND THROAT WITH OR WITHOUT PRESENCE OF ULCERS (sore throat, sores in mouth, or a toothache) UNUSUAL RASH, SWELLING OR PAIN  UNUSUAL VAGINAL DISCHARGE OR ITCHING   Items with * indicate a potential emergency and should be followed up as soon as possible or go to the Emergency Department if any problems should occur.  Please show the CHEMOTHERAPY ALERT CARD or IMMUNOTHERAPY ALERT CARD at check-in  to the Emergency Department and triage nurse.  Should you have questions after your visit or need to cancel or reschedule your appointment, please contact [facility]  [phone] and follow the prompts.  Office hours are 8:00 a.m. to 4:30 p.m. Monday - Friday. Please note that voicemails left after 4:00 p.m. may not be returned until the following business day.  We are closed weekends and major holidays. You have access to a nurse at all times for urgent questions. Please call the main number to the clinic [phone] and follow the prompts.  For any non-urgent questions, you may also contact your provider using MyChart. We now offer e-Visits for anyone 6 and older to request care online for non-urgent symptoms. For details visit [email or web address].   Also download the MyChart app! Go to the app store, search "MyChart", open the app, select [facility], and log in with your MyChart username and password.  Due to Covid, a mask is required upon entering the hospital/clinic. If you do not have a mask, one will be given to you upon arrival. For doctor visits, patients may have 1 support person aged 76 or older with them. For treatment visits, patients cannot have anyone with them due to current Covid guidelines and our immunocompromised population. Daratumumab injection What is this medication? DARATUMUMAB (dar a toom ue mab) is a monoclonal antibody. It is used to treatmultiple myeloma. This medicine may be used for other purposes; ask your health care provider orpharmacist if you have questions. COMMON BRAND NAME(S): DARZALEX What should I tell my care team before I take this medication? They need to  know if you have any of these conditions: hereditary fructose intolerance infection (especially a virus infection such as chickenpox, herpes, or hepatitis B virus) lung or breathing disease (asthma, COPD) an unusual or allergic reaction to daratumumab,  sorbitol, other medicines, foods, dyes, or preservatives pregnant or trying to get pregnant breast-feeding How should I use this medication? This medicine is for infusion into a vein. It is given by a health careprofessional in a hospital or clinic setting. Talk to your pediatrician regarding the use of this medicine in children.Special care may be needed. Overdosage: If you think you have taken too much of this medicine contact apoison control center or emergency room at once. NOTE: This medicine is only for you. Do not share this medicine with others. What if I miss a dose? Keep appointments for follow-up doses as directed. It is important not to miss your dose. Call your doctor or health care professional if you are unable tokeep an appointment. What may interact with this medication? Interactions have not been studied. This list may not describe all possible interactions. Give your health care provider a list of all the medicines, herbs, non-prescription drugs, or dietary supplements you use. Also tell them if you smoke, drink alcohol, or use illegaldrugs. Some items may interact with your medicine. What should I watch for while using this medication? Your condition will be monitored carefully while you are receiving thismedicine. This medicine can cause serious allergic reactions. To reduce your risk, your health care provider may give you other medicine to take before receiving thisone. Be sure to follow the directions from your health care provider. This medicine can affect the results of blood tests to match your blood type. These changes can last for up to 6 months after the final dose. Your healthcare provider will do blood tests to match your blood type before you start treatment. Tell all of your healthcare providers that you are being treatedwith this medicine before receiving a blood transfusion. This medicine can affect the results of some tests used to determine treatmentresponse;  extra tests may be needed to evaluate response. Do not become pregnant while taking this medicine or for 3 months after stopping it. Women should inform their health care provider if they wish to become pregnant or think they might be pregnant. There is a potential for serious side effects to an unborn child. Talk to your health care provider formore information. Do not breast-feed an infant while taking this medicine. What side effects may I notice from receiving this medication? Side effects that you should report to your doctor or health care professionalas soon as possible: allergic reactions (skin rash, itching, hives, swelling of the face, lips, or tongue) blurred vision infection (fever, chills, cough, sore throat, pain or difficulty passing urine) infusion reaction (dizziness, fast heartbeat, feeling faint or lightheaded, falls, headache, increase in blood pressure, nausea, vomiting, or wheezing or trouble breathing with loud or whistling sounds) unusual bleeding or bruising Side effects that usually do not require medical attention (report to yourdoctor or health care professional if they continue or are bothersome): constipation diarrhea pain, tingling, numbness in the hands or feet swelling of the ankles, feet, hands tiredness This list may not describe all possible side effects. Call your doctor for medical advice about side effects. You may report side effects to FDA at1-800-FDA-1088. Where should I keep my medication? This drug is given in a hospital or clinic and will not be stored at home. NOTE: This sheet is a summary. It  may not cover all possible information. If you have questions about this medicine, talk to your doctor, pharmacist, orhealth care provider.  2022 [employer] Standard (2020-11-04 12:50:38)

## 2021-05-16 NOTE — Progress Notes (Signed)
[place] NOTE  Patient Care Team: Lequita Asal, MD (Inactive) as PCP - General (Hematology and Oncology) Corey Skains, MD as Consulting Physician (Internal Medicine) Crissie Sickles, MD as Referring Physician (Hematology and Oncology) Lucilla Lame, MD as Consulting Physician (Gastroenterology) Milinda Pointer, MD as Referring Physician (Pain Medicine) Milinda Pointer, MD as Referring Physician (Pain Medicine) Cammie Sickle, MD as Consulting Physician (Hematology and Oncology)  CHIEF COMPLAINTS/PURPOSE OF CONSULTATION: MULTIPLE MYELOMA  #  Oncology History Overview Note  with stage III mutiple myeloma.  He initially presented with progressive back pain beginning in 12/2006.  MRI revealed "spots and compression fractures".  He began Velcade, thalidomide, and Decadron.  In 08/2007, he underwent high dose chemotherapy and autologous stem cell transplant.  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   He recurred with a rising M-spike (2.7) with repeat M spike (1.7 gm/dl) in 03/2010.  He was initially treated with Velcade (02/08/2010 - 05/10/2010).  He then began Revlimid (15 mg 3 weeks on/1 week off) and Decadron (40 mg on day 1, 8, 15, 22).  Because of significant side effect with Decadron his dose was decreased to 10 mg once a week in 07/2010.     He was on maintenance Revlimid.  Revlimid was initially10 mg 3 weeks on/1 week off.  This was changed to 10 mg 2 weeks on/2 weeks off secondary to right nipple tenderness.  His dose was increased to 10 mg 3 weeks on/1 week off with Decadron 10 mg a week (on Sundays) and then Revlamid 15 mg 3 weeks on and 1 week off with Decadron on Sundays.  He began Pomalyst 4 mg 3 weeks on/1 week off with Decadron on 08/27/2015.   SPEP revealed no monoclonal protein (04/21/2015) and 0.5 gm/dL on 09/22/2015 and 10/20/2015.  M spike was 0.1 on 02/02/2016, 03/01/2016, 03/29/2016, 04/26/2016, and  05/24/2016.  M spike was 0 on 10/11/2016, 11/28/2016, 02/05/2017, 03/21/2017, 08/02/2017, 10/04/2017, 12/27/2017, 01/24/2018, and 02/21/2018.  M spike was 0.1 on 11/01/2017, 0.1 on 11/29/2017, 0 on 12/27/2017, 0 on 01/24/2018, 0 on 02/21/2018, 0.1 on 03/21/2018, 0.3 on 04/18/2018, 0.1 on 05/16/2018, 0 on 06/13/2018, 0.2 on 08/08/2018, 0 on 09/09/2018, 0.1 on 11/04/2018, 0 on 12/02/2018, 0.1 on 12/30/2018, 0 on 01/27/2019, 0.1 on 02/24/2019, 0.1 on 03/24/2019, 0.1 on 04/23/2019, 0 on 05/19/2019, 0.1 on 06/17/2019, 0.2 on 07/15/2019, 0.2 on 08/18/2019, 0.2 on 09/15/2019, 0.1 on 10/13/2019, 0.1 on 11/13/2019, 0 on 12/11/2019, 0.3 on 01/08/2020, 0.2 on 02/05/2020, 0.2 on 03/04/2020, 0.2 on 04/01/2020, 0 on 05/05/2020, 0.4 on 06/07/2020, 0.4 on 07/06/2020, 0.3 on 08/03/2020, 0.1 on 08/31/2020, 0.3 on 09/28/2020, and 0.2 on 10/26/2020.    Free light chains have been monitored.  Kappa free light chains were 18.54 on 11/21/2013, 18.37 on 02/20/2014, 18.93 on 05/22/2014, 32.58 (high; normal ratio 1.27) on 08/21/2014, 51.53 (high; elevated ratio of 2.12) on 11/13/2014, 28.08 (ratio 1.73) on 12/09/2014, 23.71 (ratio 2.17) on 01/18/2015, 92.93 (ratio 9.49) on 04/21/2015, 93.44 (ratio 10.28) on 05/26/2015, 255.45 (ratio of 24.05) on 07/14/2015, 373.89 (ratio 48.31) on 08/04/2015, 474.33 (ratio 70.58) on 08/25/2015, 450.76 (ratio 55.44) on 09/22/2015, 453.4 (ratio 59.89) on 10/20/2015, 58.26 (ratio of > 40.74) on 02/02/2016, 62.67 (ratio of 37.98) on 03/01/2016, 75.7 (ratio > 50.47) on 03/29/2016, 79.7 (ratio 53.13) on 04/26/2016, 108.6 (ratio > 72.4) on 05/24/2016, 8.3 (1.46 ratio) on 10/11/2016, 6.7 (0.81 ratio) on 02/05/2017, 7.8 (1.01 ratio) on 03/21/2017,7.6 (ratio 0.63) on 06/01/2017, 8.1 (ratio 1.13) on 11/29/2017, 9.6 (ratio  1.55) on 06/13/2018, 6.6 (ratio 2.06) on 07/11/2018, 6.9 (ratio 0.82) on 08/08/2018, 5.8 (ratio 1.66) on 09/09/2018, 5.8 (ratio 1.05) on 11/04/2018, 5.6 (ratio 2.55) on 12/02/2018, 5.5 (ratio 1.96)  on 01/27/2019, 7.6 (ratio 2.38) on 06/17/2019, 6.9 (ratio 1.21) on 11/13/2019, 6.6 (ratio 2.0) on 12/11/2019, 7.9 (ratio 2.14) on 02/05/2020, 6.6 (ratio 2.28) on 04/01/2020, 7.0 (ratio 2.12) on 05/05/2020, 9.3 (ratio 2.16) on 07/06/2020,  7.1 (ratio 2.09) on 08/31/2020, and 6.9 (ratio 2.09) on 10/26/2020.   Bone survey on 12/08/2014 was stable.  Bone survey on 10/21/2015 revealed increase conspicuity of subcentimeter lytic lesions in the calvarium.  PET scan on 08/14/2018 revealed no evidence of active myeloma.  Bone survey on 11/04/2019 revealed stable small lytic lesions throughout the skull.  There were no other lytic lesions.   Bone marrow aspirate and biopsy on 11/04/2015 revealed an atypical monoclonal plasma cells estimated at 30-40% of marrow cells.   Marrow was variably cellular (approximately 45%) with background trilineage hematopoiesis. There was no significant increase in marrow reticulin fibers. Storage iron was present.     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  He develoed herpes zoster in 04/2008.  He developed a pulmonary embolism in 05/2013.  He was initially on Xarelto, but is now on Eliquis.  He had an episode of pneumonia around this time requiring a brief admission.  He developed severe lower leg cramps on 08/07/2014 secondary to hypokalemia.  Duplex was negative.   He was treated for C difficile colitis (Flagyl completed 07/30/2015).  He has a chronic indwelling pain pump.   He received 4 cycles of Pomalyst and Decadron (08/27/2015 - 11/19/2015).  Restaging studies document progressive disease.  Kappa free light chains are increasing.  SPEP revealed 0.5 gm/dL monoclonal protein then 1.3 gm/dL.  Bone survey reveals increase conspicuity of subcentimeter lytic lesions in the calvarium.  Bone marrow reveals 30-40% plasma cells.   MUGA on 11/30/2015 revealed an ejection fraction of 46%.  He is felt not to be a good candidate  for Kyprolis.  There were no focal wall motion abnormalities. He had a stress echo less than 1 year ago.  He has a history of PVCs and atrial fibrillation.  He takes Cardizem prn.   He received 17 weeks of daratumumab (Darzalex) (12/09/2015 - 05/25/2016).  He tolerated treatment well without side effect.   Bone marrow on 02/09/2016 revealed no diagnostic morphologic evidence of plasma cell myeloma.  Marrow was normocellular to hypocellular marrow for age (ranging from 10-40%) with maturing trilineage hematopoiesis and mild multilineage dyspoiesis.  There was patchy mild increase in reticulin.  Storage iron was present.  Flow cytometry revealed no definitive evidence of monoclonality.  There was a non-specific atypical myeloid and monocytic findings with no increase in blasts.  Cytogenetics were normal (46, XY).   He is currently 54 months s/p 2nd autologous stem cell transplant on 06/16/2016.  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  He also experienced atrial fibrillation with intermittent episodes of RVR requiring IV beta blockers.  He is on prophylactic valacyclovir for 1 year post transplant.   He started vaccinations (DTaP-Pediatric triple vaccine, Hep B- Pediatrix triple vaccine, Haemophilus influenza B (Hib), inactivated polio virus (IPV), pneumococcal conjugate vaccine 13-valent (PCV 13)) on 04/17/2017.  Recommendation was for Shingrix vaccine to be given in [place] and second dose of Shingrix in 2 months.  He had follow-up vaccinations on 08/28/2017.  He had  his second shingles vaccine. He received 18 month vaccinations - DTaP-Pediatric triple vaccine, Hep B- Pediatrix triple vaccine, Haemophilus influenza B (Hib), inactivated polio virus (IPV), pneumococcal conjugate vaccine 13-valent (PCV 13) on 02/05/2018.   He is s/p 52 cycles of daratumumab (Darzalex) post transplant (12/05/2016 - 12/27/20).   PET scan on 08/14/2018 revealed no  evidence of active myeloma on whole-body FDG PET scan.  There was no evidence soft tissue plasmacytoma.  There was no clear evidence of lytic lesions on the CT portion exam. Some lucencies in the pelvis were stable. There were chronic compression fractures in the lower thoracic spine.  Bone survey on 11/04/2019 revealed stable small lytic lesions noted throughout the skull.  There were no other lytic lesions. Exam was stable.  Bone survey on 11/10/2020 revealed no focal lytic lesions.  There are stable compression fractures of T5, T10 and T12.    He has chronic diarrhea. GI panel by PCR was + enteropathogenic E coli (EPEC) on 11/30/2020 and 12/13/2020.  Stool was + for adenovirus on 11/30/2020.  Stool was negative for EPEC on 12/22/2020.  He was treated with ciprofloxacin (last 12/21/2020).   He has a history of hypomagnesemia that required IV magnesium (2-4 gm) weekly.  He has not required magnesium since 10/24/2016.  Patient has atrial fibrillation and is on Eliquis.   Multiple myeloma in remission ([place])  09/19/2016 Initial Diagnosis   Multiple myeloma in remission ([place])      HISTORY OF PRESENTING ILLNESS:  Logan Perez 75 y.o.  male multiple myeloma currently on daratumumab/Dex is here for follow up.  Patient had a recent upper respiratory infection with sinus pressure/nasal congestion.  However symptoms have completely resolved.  Patient did not have any fevers or sore throat.  Did not test for COVID.   Patient has chronic back pain.  Not any worse.  No new shortness of breath or cough.  Chronic tingling and numbness in extremities.   Review of Systems  Constitutional:  Negative for chills, diaphoresis, fever, malaise/fatigue and weight loss.  HENT:  Negative for nosebleeds and sore throat.   Eyes:  Negative for double vision.  Respiratory:  Negative for cough, hemoptysis, sputum production, shortness of breath and wheezing.   Cardiovascular:  Negative for chest pain,  palpitations, orthopnea and leg swelling.  Gastrointestinal:  Negative for abdominal pain, blood in stool, constipation, diarrhea, heartburn, melena, nausea and vomiting.  Genitourinary:  Negative for dysuria, frequency and urgency.  Musculoskeletal:  Positive for back pain and joint pain.  Skin: Negative.  Negative for itching and rash.  Neurological:  Positive for tingling. Negative for dizziness, focal weakness, weakness and headaches.  Endo/Heme/Allergies:  Does not bruise/bleed easily.  Psychiatric/Behavioral:  Negative for depression. The patient is not nervous/anxious and does not have insomnia.     MEDICAL HISTORY:  Past Medical History:  Diagnosis Date  . Anxiety   . Atrial fibrillation ([place])   . BPH (benign prostatic hyperplasia)   . Complication of anesthesia    BAD HEADACHE NIGHT OF FIRST CATARACT  . Compression fracture of lumbar vertebra ([place])   . Difficulty voiding   . Dysrhythmia    A FIB  . Elevated PSA   . GERD (gastroesophageal reflux disease)   . Hearing aid worn    bilateral  . History of kidney stones   . HLD (hyperlipidemia)   . HOH (hard of hearing)   . Hypertension   . Multiple myeloma ([place])   . Neuropathy    feet.  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  . Pain    BACK  . Palpitations   . Pneumonia   . Pulmonary embolism ([place])   . Sepsis ([place])   . Stroke [facility])    TIA, detected on CT scan. pt was unaware    SURGICAL HISTORY: Past Surgical History:  Procedure Laterality Date  . BACK SURGERY  1994  . CATARACT EXTRACTION W/PHACO Right 05/02/2016   Procedure: CATARACT EXTRACTION PHACO AND INTRAOCULAR LENS PLACEMENT (IOC);  Surgeon: Birder Robson, MD;  Location: [facility] ORS;  Service: Ophthalmology;  Laterality: Right;  [place] 1.06 AP% 20.6 CDE 13.70 FLUID PACK LOT # [id number]  . CATARACT EXTRACTION W/PHACO Left 05/16/2016   Procedure: CATARACT EXTRACTION PHACO AND INTRAOCULAR LENS PLACEMENT (IOC);  Surgeon: Birder Robson, MD;  Location: [facility];  Service:  Ophthalmology;  Laterality: Left;  [place] 01:43 AP% 19.8 CDE 20.45 FLUID PACK LOT #[id number] H  . COLONOSCOPY WITH PROPOFOL N/A 03/22/2017   Procedure: COLONOSCOPY WITH PROPOFOL;  Surgeon: Lucilla Lame, MD;  Location: [place];  Service: Endoscopy;  Laterality: N/A;  has port  . ESOPHAGOGASTRODUODENOSCOPY (EGD) WITH PROPOFOL  03/22/2017   Procedure: ESOPHAGOGASTRODUODENOSCOPY (EGD) WITH PROPOFOL;  Surgeon: Lucilla Lame, MD;  Location: [place];  Service: Endoscopy;;  . EYE SURGERY    . KNEE ARTHROSCOPY Left 1992  . LIMBAL STEM CELL TRANSPLANT    . PAIN PUMP IMPLANTATION  2012  . PAIN PUMP IMPLANTATION N/A 09/04/2017   Procedure: INTRATHECAL PUMP BATTERY CHANGE;  Surgeon: Milinda Pointer, MD;  Location: [facility];  Service: Neurosurgery;  Laterality: N/A;  . PORTA CATH INSERTION N/A 12/04/2016   Procedure: Glori Luis Cath Insertion;  Surgeon: Algernon Huxley, MD;  Location: [facility];  Service: Cardiovascular;  Laterality: N/A;  . stem cell implant  2008   [facility]    SOCIAL HISTORY: Social History   Socioeconomic History  . Marital status: Married    Spouse name: Not on file  . Number of children: Not on file  . Years of education: Not on file  . Highest education level: Not on file  Occupational History  . Not on file  Tobacco Use  . Smoking status: Former    Packs/day: 2.50    Years: 12.00    Pack years: 30.00    Types: Cigarettes    Quit date: 1977    Years since quitting: 45.6  . Smokeless tobacco: Never  . Tobacco comments:    Quit 40 years ago  Vaping Use  . Vaping Use: Never used  Substance and Sexual Activity  . Alcohol use: No    Alcohol/week: 0.0 standard drinks    Comment: might have 1 beer/month  . Drug use: No  . Sexual activity: Not on file  Other Topics Concern  . Not on file  Social History Narrative  . Not on file   Social Determinants of Health   Financial Resource Strain: Not on file  Food Insecurity: Not on file  Transportation  Needs: Not on file  Physical Activity: Not on file  Stress: Not on file  Social Connections: Not on file  Intimate Partner Violence: Not on file    FAMILY HISTORY: Family History  Problem Relation Age of Onset  . Cancer Father        throat  . Kidney disease Sister   . Stroke Other   . Stroke Mother   . Bladder Cancer Neg Hx   . Prostate cancer Neg Hx   . Kidney cancer Neg Hx  ALLERGIES:  is allergic to ciprofloxacin, azithromycin, bee pollen, pollen extract, zoledronic acid, and rivaroxaban.  MEDICATIONS:  Current Outpatient Medications  Medication Sig Dispense Refill  . acetaminophen (TYLENOL) 500 MG tablet Take 500 mg by mouth 2 (two) times a day.    . baclofen (LIORESAL) 10 MG tablet Take 1 tablet (10 mg total) by mouth 2 (two) times daily. 180 tablet 0  . bismuth subsalicylate (PEPTO BISMOL) 262 MG/15ML suspension Take 30 mLs by mouth every 6 (six) hours as needed for diarrhea or loose stools.    . calcium citrate-vitamin D (CITRACAL+D) 315-200 MG-UNIT tablet Take 1 tablet by mouth 2 (two) times daily.    . cetirizine (ZYRTEC) 10 MG tablet Take 10 mg by mouth daily as needed for allergies.     . Cholecalciferol (VITAMIN D3) 2000 units capsule Take 2,000 Units daily by mouth.     . Daratumumab (DARZALEX IV) Inject 1,200 mg every 30 (thirty) days into the vein.     [name] dexamethasone (DECADRON) 4 MG tablet TAKE 4 TABLETS BY MOUTH 1 HOUR PRIOR TO INFUSION, THEN 1 TABLET ON DAYS 2 AND 3 AS DIRECTED 30 tablet 0  . diltiazem (CARDIZEM CD) 120 MG 24 hr capsule Take 120 mg daily by mouth.     . diltiazem (CARDIZEM) 60 MG tablet Take 60 mg by mouth daily as needed (for increased heart rate > 140).    [name] diphenhydrAMINE (BENADRYL) 25 MG tablet Take 25 mg by mouth as directed. With chemo treatment    . ELIQUIS 5 MG TABS tablet TAKE 1 TABLET TWICE A DAY 180 tablet 1  . fluticasone (FLONASE) 50 MCG/ACT nasal spray Place 1 spray into the nose daily as needed for allergies.    .  Hypromellose (ARTIFICIAL TEARS OP) Place 1 drop into both eyes as needed (for dry eyes).    [name] loperamide (IMODIUM) 2 MG capsule Take 4 mg by mouth as needed for diarrhea or loose stools.    [name] loratadine (CLARITIN) 10 MG tablet Take 10 mg by mouth daily as needed.     . lovastatin (MEVACOR) 20 MG tablet Take 20 mg by mouth every evening.     . montelukast (SINGULAIR) 10 MG tablet Take 1 tablet by mouth on the day before, day of, and 2 days after treatment. 30 tablet 1  . Multiple Vitamins-Iron (MULTIVITAMIN/IRON PO) Take 1 tablet by mouth daily.     . NON FORMULARY IT pump  Fentanyl 1,500.0 mcg/ml Bupivicaine 30.0 mg/ml Clonidine 300.0 mcg/ml Rate 500.4 mg/day    . omeprazole (PRILOSEC) 20 MG capsule Take 20 mg by mouth daily.     . ondansetron (ZOFRAN) 8 MG tablet Take 1 tablet (8 mg total) by mouth as directed. Take with chemo and can take it as needed 20 tablet 1  . PAIN MANAGEMENT INTRATHECAL, IT, PUMP 1 each by Intrathecal route. Intrathecal (IT) medication:  Fentanyl 1500 mcg/ml, bupivicaine 30.0 mg/ml, clonidine 300.0 mcg/ml Daily dose fent,.500.60mg/day, bupivacaine 10.00107mday, clonidine 1000.07 mcg/day    . tamsulosin (FLOMAX) 0.4 MG CAPS capsule TAKE 1 CAPSULE BY MOUTH DAILY 90 capsule 2  . valACYclovir (VALTREX) 500 MG tablet TAKE 1 TABLET DAILY 90 tablet 3  . vitamin B-12 (CYANOCOBALAMIN) 1000 MCG tablet Take 1,000 mcg daily by mouth.     . Marland Kitchenmoxicillin (AMOXIL) 500 MG capsule PLEASE SEE ATTACHED FOR DETAILED DIRECTIONS (Patient not taking: No sig reported)    . diltiazem (CARDIZEM) 60 MG tablet Take by mouth. (Patient not taking: No sig reported)    .  diltiazem (TIAZAC) 120 MG 24 hr capsule Take by mouth. (Patient not taking: No sig reported)    . naloxone (NARCAN) 2 MG/2ML injection Inject 1 mL (1 mg total) into the muscle as needed for up to 2 doses (for opioid overdose). In case of emergency (overdose), inject into muscle of upper arm or leg and call 911. (Patient not taking: No  sig reported) 2 mL 1  . potassium chloride SA (KLOR-CON) 20 MEQ tablet TAKE 1 TABLET(20 MEQ) BY MOUTH DAILY 90 tablet 0   No current facility-administered medications for this visit.      [name]  PHYSICAL EXAMINATION: ECOG PERFORMANCE STATUS: 1 - Symptomatic but completely ambulatory  Vitals:   05/16/21 0909  BP: 106/74  Pulse: 97  Resp: 18  Temp: 97.9 F (36.6 C)  SpO2: 96%   Filed Weights   05/16/21 0909  Weight: 163 lb 6.4 oz (74.1 kg)    Physical Exam Vitals and nursing note reviewed.  Constitutional:      Comments: Patient is alone. Ambulating independently.   HENT:     Head: Normocephalic and atraumatic.     Mouth/Throat:     Mouth: Mucous membranes are moist.     Pharynx: No oropharyngeal exudate.  Eyes:     Pupils: Pupils are equal, round, and reactive to light.  Cardiovascular:     Rate and Rhythm: Normal rate and regular rhythm.  Pulmonary:     Effort: No respiratory distress.     Breath sounds: No wheezing.     Comments: Decreased breath sounds bilaterally at bases.  No wheeze or crackles Abdominal:     General: Bowel sounds are normal. There is no distension.     Palpations: Abdomen is soft. There is no mass.     Tenderness: no abdominal tenderness There is no guarding or rebound.  Musculoskeletal:        General: No tenderness. Normal range of motion.     Cervical back: Normal range of motion and neck supple.  Skin:    General: Skin is warm.  Neurological:     Mental Status: He is alert and oriented to person, place, and time.  Psychiatric:        Mood and Affect: Affect normal.        Judgment: Judgment normal.    LABORATORY DATA:  I have reviewed the data as listed Lab Results  Component Value Date   WBC 8.1 05/16/2021   HGB 12.6 (L) 05/16/2021   HCT 37.0 (L) 05/16/2021   MCV 100.8 (H) 05/16/2021   PLT 216 05/16/2021   Recent Labs    06/07/20 0921 07/06/20 0835 08/03/20 0814 02/21/21 0851 04/18/21 0819 05/16/21 0834  NA 137 137    < > 135 136 134*  K 5.2* 3.8   < > 3.9 3.8 3.6  CL 102 104   < > 103 107 101  CO2 24 25   < > _0 GLUCOSE 85 103*   < > 145* 118* 161*  BUN 21 17   < > _1 CREATININE 1.53* 1.17   < > 1.41* 1.23 1.42*  CALCIUM 9.2 8.8*   < > 8.8* 8.4* 8.8*  GFRNONAA 44* >60   < > 52* >60 52*  GFRAA 51* >60  --   --   --   --   PROT 6.5 6.4*   < > 6.0* 6.1* 6.1*  ALBUMIN 4.1 4.0   < > 3.7 3.8 3.8  AST 17 20   < > _0 ALT 14 16   < > _1 ALKPHOS 46 38   < > 40 42 51  BILITOT 0.5 0.7   < > 0.8 0.7 0.5   < > = values in this interval not displayed.    RADIOGRAPHIC STUDIES: I have personally reviewed the radiological images as listed and agreed with the findings in the report. No results found.  ASSESSMENT & PLAN:   Multiple myeloma in remission ([place]) # Multiple myeloma: Status post autologous and cell transplant-s/p relapse currently on daratumumab- M spike fluctuates between 0.0 - 0.5 gm/dL in the past year. June 2022- M protein- NEG [IF; K/L 1.76]. ] PET scan on 08/14/2018 revealed no evidence of active myeloma. Bone survey on 11/10/2020 revealed no focal lytic lesions. There were stable compression fractions of T5, T10 and T12. July 2022- M protein-01.; K/l- 1.85- STABLE.    #  proceed with daratumumab today. Labs today reviewed;  acceptable for treatment today. SQ dex; dexamethasone 73m weekly on pre-meds [from home]  # PN/restless leg syndrome- [sec to thalilomid]- on baclofen 5-10 qhs. Recommend accupuncture    # Chronic diarrhea: [2-3 times a day] on immodium prn- STABLE.   # History of pulmonary embolism/ atrial fibrillation [Dr.Kowalski] on Eliquis- STABLE.    # CKD- stage III- STABLE.   # Compression fractures sec to MM- s/p Pain pump [Dr.Naveira; 2012]   # DISPOSITION: AM appts- [facility] appt # Proceed with daratumumab today.  # follow up in 4 weeks- MD; [facility];K/L ratio]- [facility]ara IV-dr.B  All questions were answered. The patient  knows to call the clinic with any problems, questions or concerns.    GCammie Sickle MD 05/21/2021 6:49 PM

## 2021-05-16 NOTE — Progress Notes (Signed)
PROVIDER NOTE: Information contained herein reflects review and annotations entered in association with encounter. Interpretation of such information and data should be left to medically-trained personnel. Information provided to patient can be located elsewhere in the medical record under "Patient Instructions". Document created using STT-dictation technology, any transcriptional errors that may result from process are unintentional.    Patient: Logan Perez  Service Category: Procedure  Provider: Gaspar Cola, MD  DOB: [birth year]  DOS: 05/17/2021  Location: [facility]  MRN: [id number]  Setting: Ambulatory - outpatient  Referring Provider: Lequita Asal, MD  Type: Established Patient  Specialty: Interventional Pain Management  PCP: Lequita Asal, MD (Inactive)   Primary Reason for Visit: Interventional Pain Management Treatment. CC: Back Pain (Thoracic - lumbar midline. )  Procedure:          Intrathecal Drug Delivery System (IDDS):  Type: Reservoir Refill [phone]) No rate change Region: Abdominal Laterality: Right  Type of Pump: Medtronic Synchromed II Delivery Route: Intrathecal Type of Pain Treated: Neuropathic/Nociceptive Primary Medication Class: Opioid/opiate  Medication, Concentration, Infusion Program, & Delivery Rate: Please see scanned programming printout.   Indications: 1. Chronic pain syndrome   2. 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 Cancer associated pain   4. Compression fracture of T12 vertebra (HCC) (70-75% magnitude) (with mild retropulsion)   5. Chronic low back pain (Bilateral) w/o sciatica   6. Presence of intrathecal pump   7. Presence of implanted infusion pump (Medtronic, programmable, intrathecal pump)   8. Encounter for adjustment or management of infusion pump   9. Pharmacologic therapy   10. Chronic use of opiate for therapeutic purpose   11. Encounter for therapeutic procedure   12. Encounter for medication  management   13. Encounter for chronic pain management    Pain Assessment: Self-Reported Pain Score: 2 /10             Reported level is compatible with observation.        Pharmacotherapy Assessment   Analgesic: Intrathecal PF-Fentanyl 502.2 mcg/day (20.9 mcg/hr) MME/day: 50.16 mg/day.   Monitoring: [place] PMP: PDMP reviewed during this encounter.       Pharmacotherapy: No side-effects or adverse reactions reported. Compliance: No problems identified. Effectiveness: Clinically acceptable. Plan: Refer to "POC". UDS: No results found for: SUMMARY   Intrathecal Pump Therapy Assessment  Manufacturer: Medtronic Synchromed Type: Programmable Volume: 40 mL reservoir MRI compatibility: Yes   Drug content:  Primary Medication Class: Opioid Primary Medication: PF-Fentanyl (1500 mcg/mL)  Secondary Medication: PF-Bupivacaine (30 mg/mL)  Other Medication: PF-Clonidine (300 mcg/mL)    Programming:  Type: Simple continuous. See pump readout for details.   Changes:  Medication Change: None at this point Rate Change: No change in rate  Reported side-effects or adverse reactions: None reported  Effectiveness: Described as relatively effective, allowing for increase in activities of daily living (ADL) Clinically meaningful improvement in function (CMIF): Sustained CMIF goals met  Plan: Pump refill today  Pre-op H&P Assessment:  Logan Perez is a 75 y.o. (year old), male patient, seen today for interventional treatment. He  has a past surgical history that includes Knee arthroscopy (Left, 1992); Pain pump implantation (2012); stem cell implant (2008); Cataract extraction w/PHACO (Right, 05/02/2016); Cataract extraction w/PHACO (Left, 05/16/2016); Limbal stem cell transplant; PORTA CATH INSERTION (N/A, 12/04/2016); Colonoscopy with propofol (N/A, 03/22/2017); Esophagogastroduodenoscopy (egd) with propofol (03/22/2017); Eye surgery; Back surgery (1994); and Pain pump implantation (N/A, 09/04/2017). Mr.  Perez has a current medication list which includes the  following prescription(s): acetaminophen, baclofen, bismuth subsalicylate, calcium citrate-vitamin d, cetirizine, vitamin d3, daratumumab, dexamethasone, diltiazem, diltiazem, diphenhydramine, eliquis, fluticasone, carboxymethylcellulose sodium, loperamide, loratadine, lovastatin, montelukast, multiple vitamins-iron, NON FORMULARY, omeprazole, ondansetron, PAIN MANAGEMENT INTRATHECAL, IT, PUMP, potassium chloride sa, tamsulosin, valacyclovir, vitamin b-12, amoxicillin, diltiazem, diltiazem, naloxone, and [DISCONTINUED] PAIN MANAGEMENT IT PUMP REFILL. His primarily concern today is the Back Pain (Thoracic - lumbar midline. )  Initial Vital Signs:  Pulse/HCG Rate: 80  Temp: (!) 97.3 F (36.3 C) Resp: 16 BP: (!) 148/78 SpO2: 98 %  BMI: Estimated body mass index is 25.53 kg/m as calculated from the following:   Height as of this encounter: 5' 7" (1.702 m).   Weight as of this encounter: 163 lb (73.9 kg).  Risk Assessment: Allergies: Reviewed. He is allergic to ciprofloxacin, azithromycin, bee pollen, pollen extract, zoledronic acid, and rivaroxaban.  Allergy Precautions: None required Coagulopathies: Reviewed. None identified.  Blood-thinner therapy: None at this time Active Infection(s): Reviewed. None identified. Logan Perez is afebrile  Site Confirmation: Logan Perez was asked to confirm the procedure and laterality before marking the site Procedure checklist: Completed Consent: Before the procedure and under the influence of no sedative(s), amnesic(s), or anxiolytics, the patient was informed of the treatment options, risks and possible complications. To fulfill our ethical and legal obligations, as recommended by the American Medical Association's Code of Ethics, I have informed the patient of my clinical impression; the nature and purpose of the treatment or procedure; the risks, benefits, and possible complications of the  intervention; the alternatives, including doing nothing; the risk(s) and benefit(s) of the alternative treatment(s) or procedure(s); and the risk(s) and benefit(s) of doing nothing.  Logan Perez was provided with information about the general risks and possible complications associated with most interventional procedures. These include, but are not limited to: failure to achieve desired goals, infection, bleeding, organ or nerve damage, allergic reactions, paralysis, and/or death.  In addition, he was informed of those risks and possible complications associated to this particular procedure, which include, but are not limited to: damage to the implant; failure to decrease pain; local, systemic, or serious CNS infections, intraspinal abscess with possible cord compression and paralysis, or life-threatening such as meningitis; bleeding; organ damage; nerve injury or damage with subsequent sensory, motor, and/or autonomic system dysfunction, resulting in transient or permanent pain, numbness, and/or weakness of one or several areas of the body; allergic reactions, either minor or major life-threatening, such as anaphylactic or anaphylactoid reactions.  Furthermore, Mr. Getman was informed of those risks and complications associated with the medications. These include, but are not limited to: allergic reactions (i.e.: anaphylactic or anaphylactoid reactions); endorphine suppression; bradycardia and/or hypotension; water retention and/or peripheral vascular relaxation leading to lower extremity edema and possible stasis ulcers; respiratory depression and/or shortness of breath; decreased metabolic rate leading to weight gain; swelling or edema; medication-induced neural toxicity; particulate matter embolism and blood vessel occlusion with resultant organ, and/or nervous system infarction; and/or intrathecal granuloma formation with possible spinal cord compression and permanent paralysis.  Before refilling the  pump Mr. Batzel was informed that some of the medications used in the devise may not be FDA approved for such use and therefore it constitutes an off-label use of the medications.  Finally, he was informed that Medicine is not an exact science; therefore, there is also the possibility of unforeseen or unpredictable risks and/or possible complications that may result in a catastrophic outcome. The patient indicated having understood very clearly. We have given the patient no guarantees  and we have made no promises. Enough time was given to the patient to ask questions, all of which were answered to the patient's satisfaction. Mr. Krogh has indicated that he wanted to continue with the procedure. Attestation: I, the ordering provider, attest that I have discussed with the patient the benefits, risks, side-effects, alternatives, likelihood of achieving goals, and potential problems during recovery for the procedure that I have provided informed consent. Date  Time: 05/17/2021 12:55 PM  Pre-Procedure Preparation:  Monitoring: As per clinic protocol. Respiration, ETCO2, SpO2, BP, heart rate and rhythm monitor placed and checked for adequate function Safety Precautions: Patient was assessed for positional comfort and pressure points before starting the procedure. Time-out: I initiated and conducted the "Time-out" before starting the procedure, as per protocol. The patient was asked to participate by confirming the accuracy of the "Time Out" information. Verification of the correct person, site, and procedure were performed and confirmed by me, the nursing staff, and the patient. "Time-out" conducted as per Joint Commission's Universal Protocol (UP.01.01.01). Time: 1318  Description of Procedure:          Position: Supine Target Area: Central-port of intrathecal pump. Approach: Anterior, 90 degree angle approach. Area Prepped: Entire Area around the pump implant. DuraPrep (Iodine Povacrylex [0.7%  available iodine] and Isopropyl Alcohol, 74% w/w) Safety Precautions: Aspiration looking for blood return was conducted prior to all injections. At no point did we inject any substances, as a needle was being advanced. No attempts were made at seeking any paresthesias. Safe injection practices and needle disposal techniques used. Medications properly checked for expiration dates. SDV (single dose vial) medications used. Description of the Procedure: Protocol guidelines were followed. Two nurses trained to do implant refills were present during the entire procedure. The refill medication was checked by both healthcare providers as well as the patient. The patient was included in the "Time-out" to verify the medication. The patient was placed in position. The pump was identified. The area was prepped in the usual manner. The sterile template was positioned over the pump, making sure the side-port location matched that of the pump. Both, the pump and the template were held for stability. The needle provided in the Medtronic Kit was then introduced thru the center of the template and into the central port. The pump content was aspirated and discarded volume documented. The new medication was slowly infused into the pump, thru the filter, making sure to avoid overpressure of the device. The needle was then removed and the area cleansed, making sure to leave some of the prepping solution back to take advantage of its long term bactericidal properties. The pump was interrogated and programmed to reflect the correct medication, volume, and dosage. The program was printed and taken to the physician for approval. Once checked and signed by the physician, a copy was provided to the patient and another scanned into the EMR. Vitals:   05/17/21 1254  BP: (!) 148/78  Pulse: 80  Resp: 16  Temp: (!) 97.3 F (36.3 C)  TempSrc: Temporal  SpO2: 98%  Weight: 163 lb (73.9 kg)  Height: 5' 7" (1.702 m)    Start Time: 1318  hrs. End Time:   hrs. Materials & Medications: Medtronic Refill Kit Medication(s): Please see chart orders for details.  Imaging Guidance:          Type of Imaging Technique: None used Indication(s): N/A Exposure Time: No patient exposure Contrast: None used. Fluoroscopic Guidance: N/A Ultrasound Guidance: N/A Interpretation: N/A  Antibiotic Prophylaxis:  Anti-infectives (From admission, onward)    None      Indication(s): None identified  Post-operative Assessment:  Post-procedure Vital Signs:  Pulse/HCG Rate: 80  Temp: (!) 97.3 F (36.3 C) Resp: 16 BP: (!) 148/78 SpO2: 98 %  EBL: None  Complications: No immediate post-treatment complications observed by team, or reported by patient.  Note: The patient tolerated the entire procedure well. A repeat set of vitals were taken after the procedure and the patient was kept under observation following institutional policy, for this type of procedure. Post-procedural neurological assessment was performed, showing return to baseline, prior to discharge. The patient was provided with post-procedure discharge instructions, including a section on how to identify potential problems. Should any problems arise concerning this procedure, the patient was given instructions to immediately contact us, at any time, without hesitation. In any case, we plan to contact the patient by telephone for a follow-up status report regarding this interventional procedure.  Comments:  No additional relevant information.  Plan of Care  Orders:  Orders Placed This Encounter  Procedures   PUMP REFILL    Maintain Protocol by having two(2) healthcare providers during procedure and programming.    Scheduling Instructions:     Please refill intrathecal pump today.    Order Specific Question:   Where will this procedure be performed?    Answer:   [facility]   PUMP REFILL    Whenever possible schedule on a procedure today.    Standing Status:    Future    Standing Expiration Date:   10/14/2021    Scheduling Instructions:     Please schedule intrathecal pump refill based on pump programming. Avoid schedule intervals of more than 120 days (4 months).    Order Specific Question:   Where will this procedure be performed?    Answer:   [facility]   Informed Consent Details: Physician/Practitioner Attestation; Transcribe to consent form and obtain patient signature    Transcribe to consent form and obtain patient signature.    Order Specific Question:   Physician/Practitioner attestation of informed consent for procedure/surgical case    Answer:   I, the physician/practitioner, attest that I have discussed with the patient the benefits, risks, side effects, alternatives, likelihood of achieving goals and potential problems during recovery for the procedure that I have provided informed consent.    Order Specific Question:   Procedure    Answer:   Intrathecal pump refill    Order Specific Question:   Physician/Practitioner performing the procedure    Answer:   Attending Physician: Kathlen Brunswick. Dossie Arbour, MD & designated trained staff    Order Specific Question:   Indication/Reason    Answer:   Chronic Pain Syndrome (G89.4), presence of an intrathecal pump (Z97.8)    Chronic Opioid Analgesic:  Intrathecal PF-Fentanyl 502.2 mcg/day (20.9 mcg/hr) MME/day: 50.16 mg/day.   Medications ordered for procedure: No orders of the defined types were placed in this encounter.  Medications administered: Lexine Baton "Rick" had no medications administered during this visit.  See the medical record for exact dosing, route, and time of administration.  Follow-up plan:   Return for Pump Refill (Max:29mo.      Interventional management options:  Considering:   Palliative intrathecal pump refills  No other procedures currently under consideration   Palliative PRN treatment(s):   Palliative management of intrathecal pump       Recent  Visits No visits were found meeting these conditions. Showing recent visits within past  90 days and meeting all other requirements Today's Visits Date Type Provider Dept  05/17/21 Procedure visit Milinda Pointer, MD [facility]  Showing today's visits and meeting all other requirements Future Appointments No visits were found meeting these conditions. Showing future appointments within next 90 days and meeting all other requirements Disposition: Discharge home  Discharge (Date  Time): 05/17/2021; 1347 hrs.   Primary Care Physician: Lequita Asal, MD (Inactive) Location: [facility] Note by: Gaspar Cola, MD Date: 05/17/2021; Time: 1:51 PM  Disclaimer:  Medicine is not an Chief Strategy Officer. The only guarantee in medicine is that nothing is guaranteed. It is important to note that the decision to proceed with this intervention was based on the information collected from the patient. The Data and conclusions were drawn from the patient's questionnaire, the interview, and the physical examination. Because the information was provided in large part by the patient, it cannot be guaranteed that it has not been purposely or unconsciously manipulated. Every effort has been made to obtain as much relevant data as possible for this evaluation. It is important to note that the conclusions that lead to this procedure are derived in large part from the available data. Always take into account that the treatment will also be dependent on availability of resources and existing treatment guidelines, considered by other Pain Management Practitioners as being common knowledge and practice, at the time of the intervention. For Medico-Legal purposes, it is also important to point out that variation in procedural techniques and pharmacological choices are the acceptable norm. The indications, contraindications, technique, and results of the above procedure should only be  interpreted and judged by a Board-Certified Interventional Pain Specialist with extensive familiarity and expertise in the same exact procedure and technique.

## 2021-05-17 ENCOUNTER — Encounter: Payer: Self-pay | Admitting: Pain Medicine

## 2021-05-17 ENCOUNTER — Other Ambulatory Visit: Payer: Self-pay

## 2021-05-17 ENCOUNTER — Ambulatory Visit: Payer: Medicare Other | Attending: Pain Medicine | Admitting: Pain Medicine

## 2021-05-17 VITALS — BP 148/78 | HR 80 | Temp 97.3°F | Resp 16 | Ht 67.0 in | Wt 163.0 lb

## 2021-05-17 DIAGNOSIS — Z451 Encounter for adjustment and management of infusion pump: Secondary | ICD-10-CM | POA: Insufficient documentation

## 2021-05-17 DIAGNOSIS — Z79891 Long term (current) use of opiate analgesic: Secondary | ICD-10-CM | POA: Insufficient documentation

## 2021-05-17 DIAGNOSIS — Y939 Activity, unspecified: Secondary | ICD-10-CM | POA: Diagnosis not present

## 2021-05-17 DIAGNOSIS — N138 Other obstructive and reflux uropathy: Secondary | ICD-10-CM

## 2021-05-17 DIAGNOSIS — Z978 Presence of other specified devices: Secondary | ICD-10-CM | POA: Insufficient documentation

## 2021-05-17 DIAGNOSIS — G8929 Other chronic pain: Secondary | ICD-10-CM

## 2021-05-17 DIAGNOSIS — G894 Chronic pain syndrome: Secondary | ICD-10-CM

## 2021-05-17 DIAGNOSIS — N401 Enlarged prostate with lower urinary tract symptoms: Secondary | ICD-10-CM

## 2021-05-17 DIAGNOSIS — G893 Neoplasm related pain (acute) (chronic): Secondary | ICD-10-CM | POA: Insufficient documentation

## 2021-05-17 DIAGNOSIS — Z5189 Encounter for other specified aftercare: Secondary | ICD-10-CM | POA: Diagnosis not present

## 2021-05-17 DIAGNOSIS — S22080S Wedge compression fracture of T11-T12 vertebra, sequela: Secondary | ICD-10-CM | POA: Diagnosis not present

## 2021-05-17 DIAGNOSIS — Z95828 Presence of other vascular implants and grafts: Secondary | ICD-10-CM | POA: Diagnosis not present

## 2021-05-17 DIAGNOSIS — Y929 Unspecified place or not applicable: Secondary | ICD-10-CM | POA: Insufficient documentation

## 2021-05-17 DIAGNOSIS — M545 Low back pain, unspecified: Secondary | ICD-10-CM | POA: Insufficient documentation

## 2021-05-17 DIAGNOSIS — C9001 Multiple myeloma in remission: Secondary | ICD-10-CM | POA: Diagnosis not present

## 2021-05-17 DIAGNOSIS — Z79899 Other long term (current) drug therapy: Secondary | ICD-10-CM | POA: Insufficient documentation

## 2021-05-17 LAB — KAPPA/LAMBDA LIGHT CHAINS
Kappa free light chain: 8.7 mg/L (ref 3.3–19.4)
Kappa, lambda light chain ratio: 1.55 (ref 0.26–1.65)
Lambda free light chains: 5.6 mg/L — ABNORMAL LOW (ref 5.7–26.3)

## 2021-05-17 NOTE — Progress Notes (Signed)
Safety precautions to be maintained throughout the outpatient stay will include: orient to surroundings, keep bed in low position, maintain call bell within reach at all times, provide assistance with transfer out of bed and ambulation.  

## 2021-05-17 NOTE — Patient Instructions (Signed)
Opioid Overdose Opioids are drugs that are often used to treat pain. Opioids include illegal drugs, such as heroin, as well as prescription pain medicines, such as codeine, morphine, hydrocodone, oxycodone, and fentanyl. An opioid overdose happens when you take too much of an opioid. An overdose may be intentional or accidentaland can happen with any type of opioid. The effects of an overdose can be mild, dangerous, or even deadly. Opioidoverdose is a medical emergency. What are the causes? This condition may be caused by: Taking too much of an opioid on purpose. Taking too much of an opioid by accident. Using two or more substances that contain opioids at the same time. Taking an opioid with a substance that affects your heart, breathing, or blood pressure. These include alcohol, tranquilizers, sleeping pills, illegal drugs, and some over-the-counter medicines. This condition may also happen due to an error made by: A health care provider who prescribes a medicine. The pharmacist who fills the prescription order. What increases the risk? This condition is more likely in: Children. They may be attracted to colorful pills. Because of a child's small size, even a small amount of a drug can be dangerous. Older people. They may be taking many different drugs. Older people may have difficulty reading labels or remembering when they last took their medicine. They may also be more sensitive to the effects of opioids. People with chronic medical conditions, especially heart, liver, kidney, or neurological diseases. People who take an opioid for a long period of time. People who use: Illegal drugs. IV heroin is especially dangerous. Other substances, including alcohol, while using an opioid. People who have: A history of drug or alcohol abuse. Certain mental health conditions. A history of previous drug overdoses. People who take opioids that are not prescribed for them. What are the signs or  symptoms? Symptoms of this condition depend on the type of opioid and the amount that was taken. Common symptoms include: Sleepiness or difficulty waking from sleep. Decrease in attention. Confusion. Slurred speech. Slowed breathing and a slow pulse (bradycardia). Nausea and vomiting. Abnormally small pupils. Signs and symptoms that require emergency treatment include: Cold, clammy, and pale skin. Blue lips and fingernails. Vomiting. Gurgling sounds in the throat. A pulse that is very slow or difficult to detect. Breathing that is very irregular, slow, noisy, or difficult to detect. Limp body. Inability to respond to speech or be awakened from sleep (stupor). Seizures. How is this diagnosed? This condition is diagnosed based on your symptoms and medical history. It is important to tell your health care provider: About all of the opioids that you took. When you took the opioids. Whether you were drinking alcohol or using marijuana, cocaine, or other drugs. Your health care provider will do a physical exam. This exam may include: Checking and monitoring your heart rate and rhythm, breathing rate, temperature, and blood pressure (vital signs). Measuring oxygen levels in your blood. Checking for abnormally small pupils. You may also have blood tests or urine tests. You may have X-rays if you arehaving severe breathing problems. How is this treated? This condition requires immediate medical treatment and hospitalization. Treatment is given in the hospital intensive care (ICU) setting. Supporting your vital signs and your breathing is the first step in treating an opioid overdose. Treatment may also include: Giving salts and minerals (electrolytes) along with fluids through an IV. Inserting a breathing tube (endotracheal tube) in your airway to help you breathe if you cannot breathe on your own or you are in   danger of not being able to breathe on your own. Giving oxygen through a small  tube under your nose. Passing a tube through your nose and into your stomach (nasogastric tube, or NG tube) to empty your stomach. Giving medicines that: Increase your blood pressure. Relieve nausea and vomiting. Relieve abdominal pain and cramping. Reverse the effects of the opioid (naloxone). Monitoring your heart and oxygen levels. Ongoing counseling and mental health support if you intentionally overdosed or used an illegal drug. Follow these instructions at home:  Medicines Take over-the-counter and prescription medicines only as told by your health care provider. Always ask your health care provider about possible side effects and interactions of any new medicine that you start taking. Keep a list of all the medicines that you take, including over-the-counter medicines. Bring this list with you to all your medical visits. General instructions Drink enough fluid to keep your urine pale yellow. Keep all follow-up visits as told by your health care provider. This is important. How is this prevented? Read the drug inserts that come with your opioid pain medicines. Take medicines only as told by your health care provider. Do not take more medicine than you are told. Do not take medicines more frequently than you are told. Do not drink alcohol or take sedatives when taking opioids. Do not use illegal or recreational drugs, including cocaine, ecstasy, and marijuana. Do not take opioid medicines that are not prescribed for you. Store all medicines in safety containers that are out of the reach of children. Get help if you are struggling with: Alcohol or drug use. Depression or another mental health problem. Thoughts of hurting yourself or another person. Keep the phone number of your local poison control center near your phone or in your mobile phone. In the [place] the hotline of the National Poison Control Center is (800) 222-1222. If you were prescribed naloxone, make sure you understand  how to take it. Contact a health care provider if you: Need help understanding how to take your pain medicines. Feel your medicines are too strong. Are concerned that your pain medicines are not working well for your pain. Develop new symptoms or side effects when you are taking medicines. Get help right away if: You or someone else is having symptoms of an opioid overdose. Get help even if you are not sure. You have serious thoughts about hurting yourself or others. You have: Chest pain. Difficulty breathing. A loss of consciousness. These symptoms may represent a serious problem that is an emergency. Do not wait to see if the symptoms will go away. Get medical help right away. Call your local emergency services (911 in the [place] Do not drive yourself to the hospital. If you ever feel like you may hurt yourself or others, or have thoughts about taking your own life, get help right away. You can go to your nearest emergency department or call: Your local emergency services (911 in the [place] A suicide crisis helpline, such as the National Suicide Prevention Lifeline at 1-800-273-8255. This is open 24 hours a day. Summary Opioids are drugs that are often used to treat pain. Opioids include illegal drugs, such as heroin, as well as prescription pain medicines. An opioid overdose happens when you take too much of an opioid. Overdoses can be intentional or accidental. Opioid overdose is very dangerous. It is a life-threatening emergency. If you or someone you know is experiencing an opioid overdose, get help right away. This information is not intended to replace advice   given to you by your health care provider. Make sure you discuss any questions you have with your healthcare provider. Document Revised: 09/12/2018 Document Reviewed: 09/12/2018 Elsevier Patient Education  2022 [employer] Inc.  

## 2021-05-18 ENCOUNTER — Telehealth: Payer: Self-pay

## 2021-05-18 NOTE — Telephone Encounter (Signed)
Post procedure phone call.  Patient states he is doing good.  

## 2021-05-19 LAB — MULTIPLE MYELOMA PANEL, SERUM
Albumin SerPl Elph-Mcnc: 3.5 g/dL (ref 2.9–4.4)
Albumin/Glob SerPl: 1.7 (ref 0.7–1.7)
Alpha 1: 0.1 g/dL (ref 0.0–0.4)
Alpha2 Glob SerPl Elph-Mcnc: 0.8 g/dL (ref 0.4–1.0)
B-Globulin SerPl Elph-Mcnc: 0.9 g/dL (ref 0.7–1.3)
Gamma Glob SerPl Elph-Mcnc: 0.3 g/dL — ABNORMAL LOW (ref 0.4–1.8)
Globulin, Total: 2.1 g/dL — ABNORMAL LOW (ref 2.2–3.9)
IgA: 22 mg/dL — ABNORMAL LOW (ref 61–437)
IgG (Immunoglobin G), Serum: 230 mg/dL — ABNORMAL LOW (ref 603–1613)
IgM (Immunoglobulin M), Srm: 36 mg/dL (ref 15–143)
M Protein SerPl Elph-Mcnc: 0.1 g/dL — ABNORMAL HIGH
Total Protein ELP: 5.6 g/dL — ABNORMAL LOW (ref 6.0–8.5)

## 2021-05-20 MED FILL — Medication: INTRATHECAL | Qty: 1 | Status: AC

## 2021-05-21 ENCOUNTER — Encounter: Payer: Self-pay | Admitting: Hematology and Oncology

## 2021-05-23 ENCOUNTER — Other Ambulatory Visit: Payer: Self-pay

## 2021-05-23 ENCOUNTER — Other Ambulatory Visit: Payer: Medicare Other

## 2021-05-23 DIAGNOSIS — N138 Other obstructive and reflux uropathy: Secondary | ICD-10-CM

## 2021-05-24 LAB — PSA: Prostate Specific Ag, Serum: 1.4 ng/mL (ref 0.0–4.0)

## 2021-05-26 NOTE — Progress Notes (Signed)
05/27/2021 7:15 PM   Logan Perez Logan Perez Nov 21, 1945 [id number]  Referring provider: Sofie Hartigan, MD [address] [place],  [place] [address]  Urological history: 1. BPH with LU TS -PSA 1.4 in 05/2021 -I PSS 4/1 -managed with tamsulosin 0.4 mg daily  2. 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 male who presents today for a one year follow up.  He has urinary hesitancy when his bladder is not extremely full.  He continues the tamsulosin 0.4 mg.    Patient denies any modifying or aggravating factors.  Patient denies any gross hematuria, dysuria or suprapubic/flank pain.  Patient denies any fevers, chills, nausea or vomiting.     IPSS     Row Name 05/27/21 1100         International Prostate Symptom Score   How often have you had the sensation of not emptying your bladder? Not at All     How often have you had to urinate less than every two hours? Not at All     How often have you found you stopped and started again several times when you urinated? Not at All     How often have you found it difficult to postpone urination? Not at All     How often have you had a weak urinary stream? Less than half the time     How often have you had to strain to start urination? Not at All     How many times did you typically get up at night to urinate? 2 Times     Total IPSS Score 4           Quality of Life due to urinary symptoms   If you were to spend the rest of your life with your urinary condition just the way it is now how would you feel about that? Pleased               Score:  1-7 Mild 8-19 Moderate 20-35 Severe  PMH: Past Medical History:  Diagnosis Date   Anxiety    Atrial fibrillation (HCC)    BPH (benign prostatic hyperplasia)    Complication of anesthesia    BAD HEADACHE NIGHT OF FIRST CATARACT   Compression fracture  of lumbar vertebra (HCC)    Difficulty voiding    Dysrhythmia    A FIB   Elevated PSA    GERD (gastroesophageal reflux disease)    Hearing aid worn    bilateral   History of kidney stones    HLD (hyperlipidemia)    HOH (hard of hearing)    Hypertension    Multiple myeloma (HCC)    Neuropathy    feet. 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   Pain    BACK   Palpitations    Pneumonia    Pulmonary embolism (HCC)    Sepsis (HCC)    Stroke (HCC)    TIA, detected on CT scan. pt was unaware    Surgical History: Past Surgical History:  Procedure Laterality Date   BACK SURGERY  1994   CATARACT EXTRACTION W/PHACO Right 05/02/2016   Procedure: CATARACT EXTRACTION PHACO AND INTRAOCULAR LENS PLACEMENT (IOC);  Surgeon: Birder Robson, MD;  Location: [facility];  Service: Ophthalmology;  Laterality: Right;  [place] 1.06 AP% 20.6 CDE 13.70 FLUID PACK LOT # [id number] H   CATARACT EXTRACTION W/PHACO Left  05/16/2016   Procedure: CATARACT EXTRACTION PHACO AND INTRAOCULAR LENS PLACEMENT (IOC);  Surgeon: Birder Robson, MD;  Location: [facility];  Service: Ophthalmology;  Laterality: Left;  [place] 01:43 AP% 19.8 CDE 20.45 FLUID PACK LOT #[id number] H   COLONOSCOPY WITH PROPOFOL N/A 03/22/2017   Procedure: COLONOSCOPY WITH PROPOFOL;  Surgeon: Lucilla Lame, MD;  Location: [place];  Service: Endoscopy;  Laterality: N/A;  has port   ESOPHAGOGASTRODUODENOSCOPY (EGD) WITH PROPOFOL  03/22/2017   Procedure: ESOPHAGOGASTRODUODENOSCOPY (EGD) WITH PROPOFOL;  Surgeon: Lucilla Lame, MD;  Location: [place];  Service: Endoscopy;;   EYE SURGERY     KNEE ARTHROSCOPY Left 1992   LIMBAL STEM CELL TRANSPLANT     PAIN PUMP IMPLANTATION  2012   PAIN PUMP IMPLANTATION N/A 09/04/2017   Procedure: INTRATHECAL PUMP BATTERY CHANGE;  Surgeon: Milinda Pointer, MD;  Location: [facility];  Service: Neurosurgery;  Laterality: N/A;   PORTA CATH INSERTION N/A 12/04/2016   Procedure: Glori Luis Cath Insertion;  Surgeon: Algernon Huxley, MD;   Location: [facility] [facility];  Service: Cardiovascular;  Laterality: N/A;   stem cell implant  2008   [facility]    Home Medications:  Allergies as of 05/27/2021       Reactions   Ciprofloxacin    diarhea   Azithromycin Diarrhea, Other (See Comments)   Possible cause of C. Diff Possible cause of C. Diff Possible cause of C. Diff Possible cause of C. 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 If you have any questions, ask your nurse or doctor.          STOP taking these medications    amoxicillin 500 MG capsule Commonly known as: AMOXIL Stopped by: Jeneal Vogl, PA-C   diltiazem 120 MG 24 hr capsule Commonly known as: TIAZAC Stopped by: Zara Council, PA-C   naloxone 2 MG/2ML injection Commonly known as: NARCAN Stopped by: Zara Council, PA-C       TAKE these medications    acetaminophen 500 MG tablet Commonly known as: TYLENOL Take 500 mg by mouth 2 (two) times a day.   ARTIFICIAL TEARS OP Place 1 drop into both eyes as needed (for dry eyes).   baclofen 10 MG tablet Commonly known as: LIORESAL Take 1 tablet (10 mg total) by mouth 2 (two) times daily.   bismuth subsalicylate 703 JK/09FG suspension Commonly known as: PEPTO BISMOL Take 30 mLs by mouth every 6 (six) hours as needed for diarrhea or loose stools.   calcium citrate-vitamin D 315-200 MG-UNIT tablet Commonly known as: CITRACAL+D Take 1 tablet by mouth 2 (two) times daily.   cetirizine 10 MG tablet Commonly known as: ZYRTEC Take 10 mg by mouth daily as needed for allergies.   DARZALEX IV Inject 1,200 mg every 30 (thirty) days into the vein.   dexamethasone 4 MG tablet Commonly known as:  DECADRON TAKE 4 TABLETS BY MOUTH 1 HOUR PRIOR TO INFUSION, THEN 1 TABLET ON DAYS 2 AND 3 AS DIRECTED   diltiazem 120 MG 24 hr capsule Commonly known as: CARDIZEM CD Take 120 mg daily by mouth.   diltiazem 60 MG  tablet Commonly known as: CARDIZEM Take 60 mg by mouth daily as needed (for increased heart rate > 140).   diphenhydrAMINE 25 MG tablet Commonly known as: BENADRYL Take 25 mg by mouth as directed. With chemo treatment   Eliquis 5 MG Tabs tablet Generic drug: apixaban TAKE 1 TABLET TWICE A DAY   fluticasone 50 MCG/ACT nasal spray Commonly known as: FLONASE Place 1 spray into the nose daily as needed for allergies.   loperamide 2 MG capsule Commonly known as: IMODIUM Take 4 mg by mouth as needed for diarrhea or loose stools.   loratadine 10 MG tablet Commonly known as: CLARITIN Take 10 mg by mouth daily as needed.   lovastatin 20 MG tablet Commonly known as: MEVACOR Take 20 mg by mouth every evening.   montelukast 10 MG tablet Commonly known as: SINGULAIR Take 1 tablet by mouth on the day before, day of, and 2 days after treatment.   MULTIVITAMIN/IRON PO Take 1 tablet by mouth daily.   NON FORMULARY IT pump  Fentanyl 1,500.0 mcg/ml Bupivicaine 30.0 mg/ml Clonidine 300.0 mcg/ml Rate 500.4 mg/day   omeprazole 20 MG capsule Commonly known as: PRILOSEC Take 20 mg by mouth daily.   ondansetron 8 MG tablet Commonly known as: ZOFRAN Take 1 tablet (8 mg total) by mouth as directed. Take with chemo and can take it as needed   PAIN MANAGEMENT INTRATHECAL (IT) PUMP 1 each by Intrathecal route. Intrathecal (IT) medication:  Fentanyl 1500 mcg/ml, bupivicaine 30.0 mg/ml, clonidine 300.0 mcg/ml Daily dose fent,.500.71mg/day, bupivacaine 10.0028mday, clonidine 1000.07 mcg/day   potassium chloride SA 20 MEQ tablet Commonly known as: KLOR-CON TAKE 1 TABLET(20 MEQ) BY MOUTH DAILY   tamsulosin 0.4 MG Caps capsule Commonly known as: FLOMAX Take 1 capsule (0.4 mg  total) by mouth daily.   valACYclovir 500 MG tablet Commonly known as: VALTREX TAKE 1 TABLET DAILY   vitamin B-12 1000 MCG tablet Commonly known as: CYANOCOBALAMIN Take 1,000 mcg daily by mouth.   Vitamin D3 50 MCG (2000 UT) capsule Take 2,000 Units daily by mouth.        Allergies:  Allergies  Allergen Reactions   Ciprofloxacin     diarhea   Azithromycin Diarrhea and Other (See Comments)    Possible cause of C. Diff Possible cause of C. Diff Possible cause of C. Diff Possible cause of C. Diff   Bee Pollen Other (See Comments)    Sneezing, watery eyes, runny nose   Pollen Extract Other (See Comments)    Sneezing, watery eyes, runny nose   Zoledronic Acid Other (See Comments)    ONG- Osteonecrosis of the jaw  Osteonecrosis of the jaw   Rivaroxaban Rash    Family History: Family History  Problem Relation Age of Onset   Cancer Father        throat   Kidney disease Sister    Stroke Other    Stroke Mother    Bladder Cancer Neg Hx    Prostate cancer Neg Hx    Kidney cancer Neg Hx     Social History:  reports that he quit smoking about 45 years ago. His smoking use included cigarettes. He has a 30.00 pack-year smoking history. He has never used smokeless tobacco. He reports that he does not drink alcohol and does not use drugs.  ROS: For pertinent review of systems please refer to history of present illness  Physical Exam: BP (!) 139/57   Pulse (!) 118   Ht '5\' 7"'  (1.702 m)  Wt 163 lb (73.9 kg)   BMI 25.53 kg/m   Constitutional:  Well nourished. Alert and oriented, No acute distress. HEENT: [place] AT, mask in place.  Trachea midline Cardiovascular: No clubbing, cyanosis, or edema. Respiratory: Normal respiratory effort, no increased work of breathing. GU: No CVA tenderness.  No bladder fullness or masses.  Patient with circumcised phallus.  Urethral meatus is patent.  No penile discharge. No penile lesions or rashes. Scrotum without lesions, cysts, rashes  and/or edema.  Testicles are located scrotally bilaterally. No masses are appreciated in the testicles. Left and right epididymis are normal. Rectal: Patient with  normal sphincter tone. Anus and perineum without scarring or rashes. No rectal masses are appreciated. Prostate is approximately 45 grams, no nodules are appreciated. Seminal vesicles could not be palpated Neurologic: Grossly intact, no focal deficits, moving all 4 extremities. Psychiatric: Normal mood and affect.   Laboratory Data: Lab Results  Component Value Date   WBC 8.1 05/16/2021   HGB 12.6 (L) 05/16/2021   HCT 37.0 (L) 05/16/2021   MCV 100.8 (H) 05/16/2021   PLT 216 05/16/2021    Lab Results  Component Value Date   CREATININE 1.42 (H) 05/16/2021   Component     Latest Ref Rng & Units 05/02/2018 05/05/2019 05/19/2020 05/23/2021  Prostate Specific Ag, Serum     0.0 - 4.0 ng/mL 0.9 1.1 1.2 1.4    Lab Results  Component Value Date   AST 26 05/16/2021   Lab Results  Component Value Date   ALT 14 05/16/2021   I have reviewed the labs   Assessment & Plan:    1. BPH with LU TS -PSA normal  -patient states his symptoms are stable -he is not wanting to pursue any further treatments at this time -continue tamsulosin 0.4 mg daily-refills given  Return in about 1 year (around 05/27/2022) for PSA, I PSS, PVR.  These notes generated with voice recognition software. I apologize for typographical errors.  Zara Council, PA-C  [facility] [address] [place], [place] [address] [phone]

## 2021-05-27 ENCOUNTER — Ambulatory Visit (INDEPENDENT_AMBULATORY_CARE_PROVIDER_SITE_OTHER): Payer: Medicare Other | Admitting: Urology

## 2021-05-27 ENCOUNTER — Other Ambulatory Visit: Payer: Self-pay

## 2021-05-27 ENCOUNTER — Encounter: Payer: Self-pay | Admitting: Urology

## 2021-05-27 VITALS — BP 139/57 | HR 118 | Ht 67.0 in | Wt 163.0 lb

## 2021-05-27 DIAGNOSIS — N401 Enlarged prostate with lower urinary tract symptoms: Secondary | ICD-10-CM | POA: Diagnosis not present

## 2021-05-27 DIAGNOSIS — N138 Other obstructive and reflux uropathy: Secondary | ICD-10-CM

## 2021-05-27 MED ORDER — TAMSULOSIN HCL 0.4 MG PO CAPS: 0.4000 mg | ORAL_CAPSULE | Freq: Every day | ORAL | 3 refills | Status: AC

## 2021-06-07 ENCOUNTER — Other Ambulatory Visit: Payer: Self-pay | Admitting: Surgery

## 2021-06-07 ENCOUNTER — Other Ambulatory Visit (HOSPITAL_COMMUNITY): Payer: Self-pay | Admitting: Surgery

## 2021-06-07 DIAGNOSIS — M7989 Other specified soft tissue disorders: Secondary | ICD-10-CM

## 2021-06-14 ENCOUNTER — Inpatient Hospital Stay: Payer: Medicare Other | Attending: Internal Medicine

## 2021-06-14 ENCOUNTER — Other Ambulatory Visit: Payer: Self-pay

## 2021-06-14 ENCOUNTER — Encounter: Payer: Self-pay | Admitting: Internal Medicine

## 2021-06-14 ENCOUNTER — Inpatient Hospital Stay: Payer: Medicare Other

## 2021-06-14 ENCOUNTER — Inpatient Hospital Stay (HOSPITAL_BASED_OUTPATIENT_CLINIC_OR_DEPARTMENT_OTHER): Payer: Medicare Other | Admitting: Internal Medicine

## 2021-06-14 VITALS — BP 137/89 | HR 81

## 2021-06-14 DIAGNOSIS — C9002 Multiple myeloma in relapse: Secondary | ICD-10-CM

## 2021-06-14 DIAGNOSIS — C9001 Multiple myeloma in remission: Secondary | ICD-10-CM | POA: Insufficient documentation

## 2021-06-14 DIAGNOSIS — Z79899 Other long term (current) drug therapy: Secondary | ICD-10-CM | POA: Diagnosis not present

## 2021-06-14 DIAGNOSIS — Z5112 Encounter for antineoplastic immunotherapy: Secondary | ICD-10-CM | POA: Diagnosis not present

## 2021-06-14 LAB — COMPREHENSIVE METABOLIC PANEL
ALT: 15 U/L (ref 0–44)
AST: 19 U/L (ref 15–41)
Albumin: 3.8 g/dL (ref 3.5–5.0)
Alkaline Phosphatase: 57 U/L (ref 38–126)
Anion gap: 8 (ref 5–15)
BUN: 17 mg/dL (ref 8–23)
CO2: 25 mmol/L (ref 22–32)
Calcium: 9 mg/dL (ref 8.9–10.3)
Chloride: 102 mmol/L (ref 98–111)
Creatinine, Ser: 1.3 mg/dL — ABNORMAL HIGH (ref 0.61–1.24)
GFR, Estimated: 57 mL/min — ABNORMAL LOW (ref >60)
Glucose, Bld: 110 mg/dL — ABNORMAL HIGH (ref 70–99)
Potassium: 4 mmol/L (ref 3.5–5.1)
Sodium: 135 mmol/L (ref 135–145)
Total Bilirubin: 0.7 mg/dL (ref 0.3–1.2)
Total Protein: 6.8 g/dL (ref 6.5–8.1)

## 2021-06-14 LAB — CBC WITH DIFFERENTIAL/PLATELET
Abs Immature Granulocytes: 0.05 10*3/uL (ref 0.00–0.07)
Basophils Absolute: 0.1 10*3/uL (ref 0.0–0.1)
Basophils Relative: 1 %
Eosinophils Absolute: 0.1 10*3/uL (ref 0.0–0.5)
Eosinophils Relative: 1 %
HCT: 35.6 % — ABNORMAL LOW (ref 39.0–52.0)
Hemoglobin: 12.3 g/dL — ABNORMAL LOW (ref 13.0–17.0)
Immature Granulocytes: 1 %
Lymphocytes Relative: 19 %
Lymphs Abs: 1.9 10*3/uL (ref 0.7–4.0)
MCH: 34.6 pg — ABNORMAL HIGH (ref 26.0–34.0)
MCHC: 34.6 g/dL (ref 30.0–36.0)
MCV: 100.3 fL — ABNORMAL HIGH (ref 80.0–100.0)
Monocytes Absolute: 0.6 10*3/uL (ref 0.1–1.0)
Monocytes Relative: 6 %
Neutro Abs: 7.6 10*3/uL (ref 1.7–7.7)
Neutrophils Relative %: 72 %
Platelets: 325 10*3/uL (ref 150–400)
RBC: 3.55 MIL/uL — ABNORMAL LOW (ref 4.22–5.81)
RDW: 13.1 % (ref 11.5–15.5)
WBC: 10.3 10*3/uL (ref 4.0–10.5)
nRBC: 0 % (ref 0.0–0.2)

## 2021-06-14 LAB — MAGNESIUM: Magnesium: 1.9 mg/dL (ref 1.7–2.4)

## 2021-06-14 MED ORDER — SODIUM CHLORIDE 0.9 % IV SOLN
16.0000 mg/kg | Freq: Once | INTRAVENOUS | Status: AC
  Administered 2021-06-14: 1200 mg via INTRAVENOUS
  Filled 2021-06-14: qty 60

## 2021-06-14 MED ORDER — HEPARIN SOD (PORK) LOCK FLUSH 100 UNIT/ML IV SOLN
500.0000 [IU] | Freq: Once | INTRAVENOUS | Status: AC | PRN
  Administered 2021-06-14: 500 [IU]
  Filled 2021-06-14: qty 5

## 2021-06-14 MED ORDER — SODIUM CHLORIDE 0.9 % IV SOLN
Freq: Once | INTRAVENOUS | Status: AC
  Filled 2021-06-14: qty 250

## 2021-06-14 NOTE — Patient Instructions (Signed)
[facility]  Discharge Instructions: Thank you for choosing [facility] to provide your oncology and hematology care.  If you have a lab appointment with the Cancer Center, please go directly to the Cancer Center and check in at the registration area.  Wear comfortable clothing and clothing appropriate for easy access to any Portacath or PICC line.   We strive to give you quality time with your provider. You may need to reschedule your appointment if you arrive late (15 or more minutes).  Arriving late affects you and other patients whose appointments are after yours.  Also, if you miss three or more appointments without notifying the office, you may be dismissed from the clinic at the provider's discretion.      For prescription refill requests, have your pharmacy contact our office and allow 72 hours for refills to be completed.    Today you received the following chemotherapy and/or immunotherapy agents       To help prevent nausea and vomiting after your treatment, we encourage you to take your nausea medication as directed.  BELOW ARE SYMPTOMS THAT SHOULD BE REPORTED IMMEDIATELY: *FEVER GREATER THAN 100.4 F (38 C) OR HIGHER *CHILLS OR SWEATING *NAUSEA AND VOMITING THAT IS NOT CONTROLLED WITH YOUR NAUSEA MEDICATION *UNUSUAL SHORTNESS OF BREATH *UNUSUAL BRUISING OR BLEEDING *URINARY PROBLEMS (pain or burning when urinating, or frequent urination) *BOWEL PROBLEMS (unusual diarrhea, constipation, pain near the anus) TENDERNESS IN MOUTH AND THROAT WITH OR WITHOUT PRESENCE OF ULCERS (sore throat, sores in mouth, or a toothache) UNUSUAL RASH, SWELLING OR PAIN  UNUSUAL VAGINAL DISCHARGE OR ITCHING   Items with * indicate a potential emergency and should be followed up as soon as possible or go to the Emergency Department if any problems should occur.  Please show the CHEMOTHERAPY ALERT CARD or IMMUNOTHERAPY ALERT CARD at check-in to the Emergency  Department and triage nurse.  Should you have questions after your visit or need to cancel or reschedule your appointment, please contact [facility]  [phone] and follow the prompts.  Office hours are 8:00 a.m. to 4:30 p.m. Monday - Friday. Please note that voicemails left after 4:00 p.m. may not be returned until the following business day.  We are closed weekends and major holidays. You have access to a nurse at all times for urgent questions. Please call the main number to the clinic [phone] and follow the prompts.  For any non-urgent questions, you may also contact your provider using MyChart. We now offer e-Visits for anyone 18 and older to request care online for non-urgent symptoms. For details visit [email or web address].   Also download the MyChart app! Go to the app store, search "MyChart", open the app, select [facility], and log in with your MyChart username and password.  Due to Covid, a mask is required upon entering the hospital/clinic. If you do not have a mask, one will be given to you upon arrival. For doctor visits, patients may have 1 support person aged 18 or older with them. For treatment visits, patients cannot have anyone with them due to current Covid guidelines and our immunocompromised population.  

## 2021-06-14 NOTE — Assessment & Plan Note (Signed)
#  Multiple myeloma: Status post autologous and cell transplant-s/p relapse currently on daratumumab- M spike fluctuates between 0.0 - 0.5 gm/dL in the past year. June 2022- M protein- NEG [IF; K/L 1.76]. ] PET scan on 08/14/2018 revealed no evidence of active myeloma. Bone survey on 11/10/2020 revealed no focal lytic lesions. There were stable compression fractions of T5, T10 and T12. July 2022- M protein-01.; K/l- 1.85- STABLE.    #  proceed with daratumumab today. Labs today reviewed;  acceptable for treatment today. SQ dex; dexamethasone 43m weekly on pre-meds [from home]  # PN/restless leg syndrome- [sec to thalilomid]- on baclofen 5-10 qhs. Recommend accupuncture   # Chronic diarrhea: [2-3 times a day]on immodium prn- STABLE.    # ONJ- discontinued Zometa- STABLE.   # History of pulmonary embolism/ atrial fibrillation [Dr.Kowalski] on Eliquis- STABLE.   # CKD- stage III- STABLE.   # Compression fractures sec to MM- s/p Pain pump [Dr.Naveira; 2012]; STABLE  # DISPOSITION:  # Proceed with daratumumab today.  # follow up in 4 weeks- MD; [facility];K/L ratio]- Dara IV- Dr.B

## 2021-06-14 NOTE — Progress Notes (Signed)
Will like to talk about previously being on "zometa"

## 2021-06-14 NOTE — Progress Notes (Signed)
[place] NOTE  Patient Care Team: Sofie Hartigan, MD as PCP - General (Family Medicine) Corey Skains, MD as Consulting Physician (Internal Medicine) Crissie Sickles, MD as Referring Physician (Hematology and Oncology) Lucilla Lame, MD as Consulting Physician (Gastroenterology) Milinda Pointer, MD as Referring Physician (Pain Medicine) Milinda Pointer, MD as Referring Physician (Pain Medicine) Cammie Sickle, MD as Consulting Physician (Hematology and Oncology)  CHIEF COMPLAINTS/PURPOSE OF CONSULTATION: MULTIPLE MYELOMA  #  Oncology History Overview Note  with stage III mutiple myeloma.  He initially presented with progressive back pain beginning in 12/2006.  MRI revealed "spots and compression fractures".  He began Velcade, thalidomide, and Decadron.  In 08/2007, he underwent high dose chemotherapy and autologous stem cell transplant.  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   He recurred with a rising M-spike (2.7) with repeat M spike (1.7 gm/dl) in 03/2010.  He was initially treated with Velcade (02/08/2010 - 05/10/2010).  He then began Revlimid (15 mg 3 weeks on/1 week off) and Decadron (40 mg on day 1, 8, 15, 22).  Because of significant side effect with Decadron his dose was decreased to 10 mg once a week in 07/2010.     He was on maintenance Revlimid.  Revlimid was initially10 mg 3 weeks on/1 week off.  This was changed to 10 mg 2 weeks on/2 weeks off secondary to right nipple tenderness.  His dose was increased to 10 mg 3 weeks on/1 week off with Decadron 10 mg a week (on Sundays) and then Revlamid 15 mg 3 weeks on and 1 week off with Decadron on Sundays.  He began Pomalyst 4 mg 3 weeks on/1 week off with Decadron on 08/27/2015.   SPEP revealed no monoclonal protein (04/21/2015) and 0.5 gm/dL on 09/22/2015 and 10/20/2015.  M spike was 0.1 on 02/02/2016, 03/01/2016, 03/29/2016, 04/26/2016, and 05/24/2016.  M spike was  0 on 10/11/2016, 11/28/2016, 02/05/2017, 03/21/2017, 08/02/2017, 10/04/2017, 12/27/2017, 01/24/2018, and 02/21/2018.  M spike was 0.1 on 11/01/2017, 0.1 on 11/29/2017, 0 on 12/27/2017, 0 on 01/24/2018, 0 on 02/21/2018, 0.1 on 03/21/2018, 0.3 on 04/18/2018, 0.1 on 05/16/2018, 0 on 06/13/2018, 0.2 on 08/08/2018, 0 on 09/09/2018, 0.1 on 11/04/2018, 0 on 12/02/2018, 0.1 on 12/30/2018, 0 on 01/27/2019, 0.1 on 02/24/2019, 0.1 on 03/24/2019, 0.1 on 04/23/2019, 0 on 05/19/2019, 0.1 on 06/17/2019, 0.2 on 07/15/2019, 0.2 on 08/18/2019, 0.2 on 09/15/2019, 0.1 on 10/13/2019, 0.1 on 11/13/2019, 0 on 12/11/2019, 0.3 on 01/08/2020, 0.2 on 02/05/2020, 0.2 on 03/04/2020, 0.2 on 04/01/2020, 0 on 05/05/2020, 0.4 on 06/07/2020, 0.4 on 07/06/2020, 0.3 on 08/03/2020, 0.1 on 08/31/2020, 0.3 on 09/28/2020, and 0.2 on 10/26/2020.    Free light chains have been monitored.  Kappa free light chains were 18.54 on 11/21/2013, 18.37 on 02/20/2014, 18.93 on 05/22/2014, 32.58 (high; normal ratio 1.27) on 08/21/2014, 51.53 (high; elevated ratio of 2.12) on 11/13/2014, 28.08 (ratio 1.73) on 12/09/2014, 23.71 (ratio 2.17) on 01/18/2015, 92.93 (ratio 9.49) on 04/21/2015, 93.44 (ratio 10.28) on 05/26/2015, 255.45 (ratio of 24.05) on 07/14/2015, 373.89 (ratio 48.31) on 08/04/2015, 474.33 (ratio 70.58) on 08/25/2015, 450.76 (ratio 55.44) on 09/22/2015, 453.4 (ratio 59.89) on 10/20/2015, 58.26 (ratio of > 40.74) on 02/02/2016, 62.67 (ratio of 37.98) on 03/01/2016, 75.7 (ratio > 50.47) on 03/29/2016, 79.7 (ratio 53.13) on 04/26/2016, 108.6 (ratio > 72.4) on 05/24/2016, 8.3 (1.46 ratio) on 10/11/2016, 6.7 (0.81 ratio) on 02/05/2017, 7.8 (1.01 ratio) on 03/21/2017,7.6 (ratio 0.63) on 06/01/2017, 8.1 (ratio 1.13) on 11/29/2017, 9.6 (ratio 1.55) on  06/13/2018, 6.6 (ratio 2.06) on 07/11/2018, 6.9 (ratio 0.82) on 08/08/2018, 5.8 (ratio 1.66) on 09/09/2018, 5.8 (ratio 1.05) on 11/04/2018, 5.6 (ratio 2.55) on 12/02/2018, 5.5 (ratio 1.96) on 01/27/2019, 7.6 (ratio  2.38) on 06/17/2019, 6.9 (ratio 1.21) on 11/13/2019, 6.6 (ratio 2.0) on 12/11/2019, 7.9 (ratio 2.14) on 02/05/2020, 6.6 (ratio 2.28) on 04/01/2020, 7.0 (ratio 2.12) on 05/05/2020, 9.3 (ratio 2.16) on 07/06/2020,  7.1 (ratio 2.09) on 08/31/2020, and 6.9 (ratio 2.09) on 10/26/2020.   Bone survey on 12/08/2014 was stable.  Bone survey on 10/21/2015 revealed increase conspicuity of subcentimeter lytic lesions in the calvarium.  PET scan on 08/14/2018 revealed no evidence of active myeloma.  Bone survey on 11/04/2019 revealed stable small lytic lesions throughout the skull.  There were no other lytic lesions.   Bone marrow aspirate and biopsy on 11/04/2015 revealed an atypical monoclonal plasma cells estimated at 30-40% of marrow cells.   Marrow was variably cellular (approximately 45%) with background trilineage hematopoiesis. There was no significant increase in marrow reticulin fibers. Storage iron was present.     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  He develoed herpes zoster in 04/2008.  He developed a pulmonary embolism in 05/2013.  He was initially on Xarelto, but is now on Eliquis.  He had an episode of pneumonia around this time requiring a brief admission.  He developed severe lower leg cramps on 08/07/2014 secondary to hypokalemia.  Duplex was negative.   He was treated for C difficile colitis (Flagyl completed 07/30/2015).  He has a chronic indwelling pain pump.   He received 4 cycles of Pomalyst and Decadron (08/27/2015 - 11/19/2015).  Restaging studies document progressive disease.  Kappa free light chains are increasing.  SPEP revealed 0.5 gm/dL monoclonal protein then 1.3 gm/dL.  Bone survey reveals increase conspicuity of subcentimeter lytic lesions in the calvarium.  Bone marrow reveals 30-40% plasma cells.   MUGA on 11/30/2015 revealed an ejection fraction of 46%.  He is felt not to be a good candidate for Kyprolis.  There were  no focal wall motion abnormalities. He had a stress echo less than 1 year ago.  He has a history of PVCs and atrial fibrillation.  He takes Cardizem prn.   He received 17 weeks of daratumumab (Darzalex) (12/09/2015 - 05/25/2016).  He tolerated treatment well without side effect.   Bone marrow on 02/09/2016 revealed no diagnostic morphologic evidence of plasma cell myeloma.  Marrow was normocellular to hypocellular marrow for age (ranging from 10-40%) with maturing trilineage hematopoiesis and mild multilineage dyspoiesis.  There was patchy mild increase in reticulin.  Storage iron was present.  Flow cytometry revealed no definitive evidence of monoclonality.  There was a non-specific atypical myeloid and monocytic findings with no increase in blasts.  Cytogenetics were normal (46, XY).   He is currently 54 months s/p 2nd autologous stem cell transplant on 06/16/2016.  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  He also experienced atrial fibrillation with intermittent episodes of RVR requiring IV beta blockers.  He is on prophylactic valacyclovir for 1 year post transplant.   He started vaccinations (DTaP-Pediatric triple vaccine, Hep B- Pediatrix triple vaccine, Haemophilus influenza B (Hib), inactivated polio virus (IPV), pneumococcal conjugate vaccine 13-valent (PCV 13)) on 04/17/2017.  Recommendation was for Shingrix vaccine to be given in [place] and second dose of Shingrix in 2 months.  He had follow-up vaccinations on 08/28/2017.  He had his second  shingles vaccine. He received 18 month vaccinations - DTaP-Pediatric triple vaccine, Hep B- Pediatrix triple vaccine, Haemophilus influenza B (Hib), inactivated polio virus (IPV), pneumococcal conjugate vaccine 13-valent (PCV 13) on 02/05/2018.   He is s/p 52 cycles of daratumumab (Darzalex) post transplant (12/05/2016 - 12/27/20).   PET scan on 08/14/2018 revealed no evidence of active myeloma on  whole-body FDG PET scan.  There was no evidence soft tissue plasmacytoma.  There was no clear evidence of lytic lesions on the CT portion exam. Some lucencies in the pelvis were stable. There were chronic compression fractures in the lower thoracic spine.  Bone survey on 11/04/2019 revealed stable small lytic lesions noted throughout the skull.  There were no other lytic lesions. Exam was stable.  Bone survey on 11/10/2020 revealed no focal lytic lesions.  There are stable compression fractures of T5, T10 and T12.    He has chronic diarrhea. GI panel by PCR was + enteropathogenic E coli (EPEC) on 11/30/2020 and 12/13/2020.  Stool was + for adenovirus on 11/30/2020.  Stool was negative for EPEC on 12/22/2020.  He was treated with ciprofloxacin (last 12/21/2020).   He has a history of hypomagnesemia that required IV magnesium (2-4 gm) weekly.  He has not required magnesium since 10/24/2016.  Patient has atrial fibrillation and is on Eliquis.   Multiple myeloma in remission ([place])  09/19/2016 Initial Diagnosis   Multiple myeloma in remission ([place])      HISTORY OF PRESENTING ILLNESS:  Patient is alone. Ambulating independently.   Logan Perez 75 y.o.  male multiple myeloma currently on daratumumab/Dex is here for follow up.  No recent infections.  No hospitalizations.  No nausea vomiting.  Chronic mild tingling and numbness in extremities   Review of Systems  Constitutional:  Negative for chills, diaphoresis, fever, malaise/fatigue and weight loss.  HENT:  Negative for nosebleeds and sore throat.   Eyes:  Negative for double vision.  Respiratory:  Negative for cough, hemoptysis, sputum production, shortness of breath and wheezing.   Cardiovascular:  Negative for chest pain, palpitations, orthopnea and leg swelling.  Gastrointestinal:  Negative for abdominal pain, blood in stool, constipation, diarrhea, heartburn, melena, nausea and vomiting.  Genitourinary:  Negative for dysuria,  frequency and urgency.  Musculoskeletal:  Positive for back pain and joint pain.  Skin: Negative.  Negative for itching and rash.  Neurological:  Positive for tingling. Negative for dizziness, focal weakness, weakness and headaches.  Endo/Heme/Allergies:  Does not bruise/bleed easily.  Psychiatric/Behavioral:  Negative for depression. The patient is not nervous/anxious and does not have insomnia.     MEDICAL HISTORY:  Past Medical History:  Diagnosis Date   Anxiety    Atrial fibrillation (HCC)    BPH (benign prostatic hyperplasia)    Complication of anesthesia    BAD HEADACHE NIGHT OF FIRST CATARACT   Compression fracture of lumbar vertebra (HCC)    Difficulty voiding    Dysrhythmia    A FIB   Elevated PSA    GERD (gastroesophageal reflux disease)    Hearing aid worn    bilateral   History of kidney stones    HLD (hyperlipidemia)    HOH (hard of hearing)    Hypertension    Multiple myeloma (HCC)    Neuropathy    feet. 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   Pain    BACK   Palpitations    Pneumonia    Pulmonary embolism ([place])    Sepsis ([place])    Stroke ([place])  TIA, detected on CT scan. pt was unaware    SURGICAL HISTORY: Past Surgical History:  Procedure Laterality Date   BACK SURGERY  1994   CATARACT EXTRACTION W/PHACO Right 05/02/2016   Procedure: CATARACT EXTRACTION PHACO AND INTRAOCULAR LENS PLACEMENT ([place]);  Surgeon: Birder Robson, MD;  Location: [facility];  Service: Ophthalmology;  Laterality: Right;  [place] 1.06 AP% 20.6 CDE 13.70 FLUID PACK LOT # [id number]   CATARACT EXTRACTION W/PHACO Left 05/16/2016   Procedure: CATARACT EXTRACTION PHACO AND INTRAOCULAR LENS PLACEMENT ([facility]);  Surgeon: Birder Robson, MD;  Location: [facility];  Service: Ophthalmology;  Laterality: Left;  [place] 01:43 AP% 19.8 CDE 20.45 FLUID PACK LOT #[id number] H   COLONOSCOPY WITH PROPOFOL N/A 03/22/2017   Procedure: COLONOSCOPY WITH PROPOFOL;  Surgeon: Lucilla Lame, MD;  Location: [facility];  Service:  Endoscopy;  Laterality: N/A;  has port   ESOPHAGOGASTRODUODENOSCOPY (EGD) WITH PROPOFOL  03/22/2017   Procedure: ESOPHAGOGASTRODUODENOSCOPY (EGD) WITH PROPOFOL;  Surgeon: Lucilla Lame, MD;  Location: [facility];  Service: Endoscopy;;   EYE SURGERY     KNEE ARTHROSCOPY Left 1992   LIMBAL STEM CELL TRANSPLANT     PAIN PUMP IMPLANTATION  2012   PAIN PUMP IMPLANTATION N/A 09/04/2017   Procedure: INTRATHECAL PUMP BATTERY CHANGE;  Surgeon: Milinda Pointer, MD;  Location: [facility];  Service: Neurosurgery;  Laterality: N/A;   PORTA CATH INSERTION N/A 12/04/2016   Procedure: Glori Luis Cath Insertion;  Surgeon: Algernon Huxley, MD;  Location: [facility];  Service: Cardiovascular;  Laterality: N/A;   stem cell implant  2008   [facility]    SOCIAL HISTORY: Social History   Socioeconomic History   Marital status: Married    Spouse name: Not on file   Number of children: Not on file   Years of education: Not on file   Highest education level: Not on file  Occupational History   Not on file  Tobacco Use   Smoking status: Former    Packs/day: 2.50    Years: 12.00    Pack years: 30.00    Types: Cigarettes    Quit date: 30    Years since quitting: 45.7   Smokeless tobacco: Never   Tobacco comments:    Quit 40 years ago  Electronics engineer Use   Vaping Use: Never used  Substance and Sexual Activity   Alcohol use: No    Alcohol/week: 0.0 standard drinks    Comment: might have 1 beer/month   Drug use: No   Sexual activity: Not on file  Other Topics Concern   Not on file  Social History Narrative   Not on file   Social Determinants of Health   Financial Resource Strain: Not on file  Food Insecurity: Not on file  Transportation Needs: Not on file  Physical Activity: Not on file  Stress: Not on file  Social Connections: Not on file  Intimate Partner Violence: Not on file    FAMILY HISTORY: Family History  Problem Relation Age of Onset   Cancer Father        throat   Kidney  disease Sister    Stroke Other    Stroke Mother    Bladder Cancer Neg Hx    Prostate cancer Neg Hx    Kidney cancer Neg Hx     ALLERGIES:  is allergic to ciprofloxacin, azithromycin, bee pollen, pollen extract, zoledronic acid, and rivaroxaban.  MEDICATIONS:  Current Outpatient Medications  Medication Sig Dispense Refill   acetaminophen (TYLENOL) 500 MG tablet  Take 500 mg by mouth 2 (two) times a day.     baclofen (LIORESAL) 10 MG tablet Take 1 tablet by mouth 2 (two) times daily.     calcium citrate-vitamin D (CITRACAL+D) 315-200 MG-UNIT tablet Take 1 tablet by mouth 2 (two) times daily.     Cholecalciferol (VITAMIN D3) 2000 units capsule Take 2,000 Units daily by mouth.      Daratumumab (DARZALEX IV) Inject 1,200 mg every 30 (thirty) days into the vein.      diltiazem (CARDIZEM CD) 120 MG 24 hr capsule Take 120 mg daily by mouth.      diphenhydrAMINE (BENADRYL) 25 MG tablet Take 25 mg by mouth as directed. With chemo treatment     ELIQUIS 5 MG TABS tablet TAKE 1 TABLET TWICE A DAY 180 tablet 1   Hypromellose (ARTIFICIAL TEARS OP) Place 1 drop into both eyes as needed (for dry eyes).     lovastatin (MEVACOR) 20 MG tablet Take 20 mg by mouth every evening.      Multiple Vitamins-Iron (MULTIVITAMIN/IRON PO) Take 1 tablet by mouth daily.      NON FORMULARY IT pump  Fentanyl 1,500.0 mcg/ml Bupivicaine 30.0 mg/ml Clonidine 300.0 mcg/ml Rate 500.4 mg/day     omeprazole (PRILOSEC) 20 MG capsule Take 20 mg by mouth daily.      PAIN MANAGEMENT INTRATHECAL, IT, PUMP 1 each by Intrathecal route. Intrathecal (IT) medication:  Fentanyl 1500 mcg/ml, bupivicaine 30.0 mg/ml, clonidine 300.0 mcg/ml Daily dose fent,.500.87mg/day, bupivacaine 10.0019mday, clonidine 1000.07 mcg/day     tamsulosin (FLOMAX) 0.4 MG CAPS capsule Take 1 capsule (0.4 mg total) by mouth daily. 90 capsule 3   valACYclovir (VALTREX) 500 MG tablet TAKE 1 TABLET DAILY 90 tablet 3   vitamin B-12 (CYANOCOBALAMIN) 1000 MCG tablet  Take 1,000 mcg daily by mouth.      bismuth subsalicylate (PEPTO BISMOL) 262 MG/15ML suspension Take 30 mLs by mouth every 6 (six) hours as needed for diarrhea or loose stools. (Patient not taking: Reported on 06/14/2021)     cetirizine (ZYRTEC) 10 MG tablet Take 10 mg by mouth daily as needed for allergies.  (Patient not taking: Reported on 06/14/2021)     dexamethasone (DECADRON) 4 MG tablet TAKE 4 TABLETS BY MOUTH 1 HOUR PRIOR TO INFUSION, THEN 1 TABLET ON DAYS 2 AND 3 AS DIRECTED 30 tablet 0   diltiazem (CARDIZEM) 60 MG tablet Take 60 mg by mouth daily as needed (for increased heart rate > 140). (Patient not taking: Reported on 06/14/2021)     fluticasone (FLONASE) 50 MCG/ACT nasal spray Place 1 spray into the nose daily as needed for allergies. (Patient not taking: Reported on 06/14/2021)     loperamide (IMODIUM) 2 MG capsule Take 4 mg by mouth as needed for diarrhea or loose stools. (Patient not taking: Reported on 06/14/2021)     loratadine (CLARITIN) 10 MG tablet Take 10 mg by mouth daily as needed.  (Patient not taking: Reported on 06/14/2021)     montelukast (SINGULAIR) 10 MG tablet Take 1 tablet by mouth on the day before, day of, and 2 days after treatment. (Patient not taking: Reported on 06/14/2021) 30 tablet 1   ondansetron (ZOFRAN) 8 MG tablet Take 1 tablet (8 mg total) by mouth as directed. Take with chemo and can take it as needed (Patient not taking: Reported on 06/14/2021) 20 tablet 1   potassium chloride SA (KLOR-CON) 20 MEQ tablet TAKE 1 TABLET(20 MEQ) BY MOUTH DAILY (Patient not taking: Reported on 06/14/2021) 90  tablet 0   No current facility-administered medications for this visit.      [name]  PHYSICAL EXAMINATION: ECOG PERFORMANCE STATUS: 1 - Symptomatic but completely ambulatory  Vitals:   06/14/21 0841  BP: (!) 145/94  Pulse: 94  Resp: 18  Temp: 97.6 F (36.4 C)  SpO2: 98%   Filed Weights   06/14/21 0841  Weight: 163 lb 13.9 oz (74.3 kg)    Physical Exam Vitals and nursing  note reviewed.  HENT:     Head: Normocephalic and atraumatic.     Mouth/Throat:     Mouth: Mucous membranes are moist.     Pharynx: No oropharyngeal exudate.  Eyes:     Pupils: Pupils are equal, round, and reactive to light.  Cardiovascular:     Rate and Rhythm: Normal rate and regular rhythm.  Pulmonary:     Effort: No respiratory distress.     Breath sounds: No wheezing.     Comments: Decreased breath sounds bilaterally at bases.  No wheeze or crackles Abdominal:     General: Bowel sounds are normal. There is no distension.     Palpations: Abdomen is soft. There is no mass.     Tenderness: There is no abdominal tenderness. There is no guarding or rebound.  Musculoskeletal:        General: No tenderness. Normal range of motion.     Cervical back: Normal range of motion and neck supple.  Skin:    General: Skin is warm.  Neurological:     Mental Status: He is alert and oriented to person, place, and time.  Psychiatric:        Mood and Affect: Affect normal.        Judgment: Judgment normal.    LABORATORY DATA:  I have reviewed the data as listed Lab Results  Component Value Date   WBC 10.3 06/14/2021   HGB 12.3 (L) 06/14/2021   HCT 35.6 (L) 06/14/2021   MCV 100.3 (H) 06/14/2021   PLT 325 06/14/2021   Recent Labs    07/06/20 0835 08/03/20 0814 04/18/21 0819 05/16/21 0834 06/14/21 0827  NA 137   < > 136 134* 135  K 3.8   < > 3.8 3.6 4.0  CL 104   < > 107 101 102  CO2 25   < > _0 GLUCOSE 103*   < > 118* 161* 110*  BUN 17   < > _1 CREATININE 1.17   < > 1.23 1.42* 1.30*  CALCIUM 8.8*   < > 8.4* 8.8* 9.0  GFRNONAA >60   < > >60 52* 57*  GFRAA >60  --   --   --   --   PROT 6.4*   < > 6.1* 6.1* 6.8  ALBUMIN 4.0   < > 3.8 3.8 3.8  AST 20   < > _2 ALT 16   < > _3 ALKPHOS 38   < > 42 51 57  BILITOT 0.7   < > 0.7 0.5 0.7   < > = values in this interval not displayed.    RADIOGRAPHIC STUDIES: I have personally reviewed the radiological  images as listed and agreed with the findings in the report. No results found.  ASSESSMENT & PLAN:   Multiple myeloma in remission ([place]) # Multiple myeloma: Status post autologous and cell transplant-s/p relapse currently on daratumumab- M spike fluctuates between 0.0 - 0.5 gm/dL in the past year.  June 2022- M protein- NEG [IF; K/L 1.76]. ] PET scan on 08/14/2018 revealed no evidence of active myeloma. Bone survey on 11/10/2020 revealed no focal lytic lesions. There were stable compression fractions of T5, T10 and T12. July 2022- M protein-01.; K/l- 1.85- STABLE.    #  proceed with daratumumab today. Labs today reviewed;  acceptable for treatment today. SQ dex; dexamethasone 9m weekly on pre-meds [from home]  # PN/restless leg syndrome- [sec to thalilomid]- on baclofen 5-10 qhs. Recommend accupuncture    # Chronic diarrhea: [2-3 times a day] on immodium prn- STABLE.    # ONJ- discontinued Zometa- STABLE.   # History of pulmonary embolism/ atrial fibrillation [Dr.Kowalski] on Eliquis- STABLE.    # CKD- stage III- STABLE.   # Compression fractures sec to MM- s/p Pain pump [Dr.Naveira; 2012]; STABLE   # DISPOSITION:  # Proceed with daratumumab today.  # follow up in 4 weeks- MD; [facility]K/L ratio[facility] IV- Dr.B  All questions were answered. The patient knows to call the clinic with any problems, questions or concerns.    GCammie Sickle MD 06/14/2021 12:50 PM

## 2021-06-15 LAB — KAPPA/LAMBDA LIGHT CHAINS
Kappa free light chain: 8.5 mg/L (ref 3.3–19.4)
Kappa, lambda light chain ratio: 1.37 (ref 0.26–1.65)
Lambda free light chains: 6.2 mg/L (ref 5.7–26.3)

## 2021-06-17 ENCOUNTER — Ambulatory Visit
Admission: RE | Admit: 2021-06-17 | Discharge: 2021-06-17 | Disposition: A | Discharge status: Home / Self Care | Payer: Medicare Other | Source: Ambulatory Visit | Attending: Surgery | Admitting: Surgery

## 2021-06-17 ENCOUNTER — Other Ambulatory Visit: Payer: Self-pay

## 2021-06-17 DIAGNOSIS — M7989 Other specified soft tissue disorders: Secondary | ICD-10-CM | POA: Diagnosis not present

## 2021-06-17 MED ORDER — GADOBUTROL 1 MMOL/ML IV SOLN
7.5000 mL | Freq: Once | INTRAVENOUS | Status: AC | PRN
  Administered 2021-06-17: 7.5 mL via INTRAVENOUS

## 2021-06-21 LAB — MULTIPLE MYELOMA PANEL, SERUM
Albumin SerPl Elph-Mcnc: 4 g/dL (ref 2.9–4.4)
Albumin/Glob SerPl: 1.9 — ABNORMAL HIGH (ref 0.7–1.7)
Alpha 1: 0.2 g/dL (ref 0.0–0.4)
Alpha2 Glob SerPl Elph-Mcnc: 1 g/dL (ref 0.4–1.0)
B-Globulin SerPl Elph-Mcnc: 0.8 g/dL (ref 0.7–1.3)
Gamma Glob SerPl Elph-Mcnc: 0.2 g/dL — ABNORMAL LOW (ref 0.4–1.8)
Globulin, Total: 2.2 g/dL (ref 2.2–3.9)
IgA: 28 mg/dL — ABNORMAL LOW (ref 61–437)
IgG (Immunoglobin G), Serum: 301 mg/dL — ABNORMAL LOW (ref 603–1613)
IgM (Immunoglobulin M), Srm: 58 mg/dL (ref 15–143)
M Protein SerPl Elph-Mcnc: <0.1 g/dL
Total Protein ELP: 6.2 g/dL (ref 6.0–8.5)

## 2021-07-12 ENCOUNTER — Inpatient Hospital Stay (HOSPITAL_BASED_OUTPATIENT_CLINIC_OR_DEPARTMENT_OTHER): Payer: Medicare Other | Admitting: Internal Medicine

## 2021-07-12 ENCOUNTER — Inpatient Hospital Stay: Payer: Medicare Other

## 2021-07-12 ENCOUNTER — Other Ambulatory Visit: Payer: Self-pay

## 2021-07-12 ENCOUNTER — Inpatient Hospital Stay: Payer: Medicare Other | Attending: Internal Medicine

## 2021-07-12 VITALS — BP 127/82 | HR 80 | Temp 96.7°F | Resp 16

## 2021-07-12 DIAGNOSIS — Z79899 Other long term (current) drug therapy: Secondary | ICD-10-CM | POA: Diagnosis not present

## 2021-07-12 DIAGNOSIS — Z5112 Encounter for antineoplastic immunotherapy: Secondary | ICD-10-CM | POA: Insufficient documentation

## 2021-07-12 DIAGNOSIS — C9001 Multiple myeloma in remission: Secondary | ICD-10-CM | POA: Diagnosis not present

## 2021-07-12 DIAGNOSIS — C9002 Multiple myeloma in relapse: Secondary | ICD-10-CM

## 2021-07-12 LAB — CBC WITH DIFFERENTIAL/PLATELET
Abs Immature Granulocytes: 0.03 10*3/uL (ref 0.00–0.07)
Basophils Absolute: 0 10*3/uL (ref 0.0–0.1)
Basophils Relative: 1 %
Eosinophils Absolute: 0.2 10*3/uL (ref 0.0–0.5)
Eosinophils Relative: 2 %
HCT: 36.8 % — ABNORMAL LOW (ref 39.0–52.0)
Hemoglobin: 12.6 g/dL — ABNORMAL LOW (ref 13.0–17.0)
Immature Granulocytes: 0 %
Lymphocytes Relative: 21 %
Lymphs Abs: 1.7 10*3/uL (ref 0.7–4.0)
MCH: 35.3 pg — ABNORMAL HIGH (ref 26.0–34.0)
MCHC: 34.2 g/dL (ref 30.0–36.0)
MCV: 103.1 fL — ABNORMAL HIGH (ref 80.0–100.0)
Monocytes Absolute: 0.5 10*3/uL (ref 0.1–1.0)
Monocytes Relative: 6 %
Neutro Abs: 5.6 10*3/uL (ref 1.7–7.7)
Neutrophils Relative %: 70 %
Platelets: 196 10*3/uL (ref 150–400)
RBC: 3.57 MIL/uL — ABNORMAL LOW (ref 4.22–5.81)
RDW: 13.4 % (ref 11.5–15.5)
WBC: 8 10*3/uL (ref 4.0–10.5)
nRBC: 0 % (ref 0.0–0.2)

## 2021-07-12 LAB — COMPREHENSIVE METABOLIC PANEL
ALT: 15 U/L (ref 0–44)
AST: 23 U/L (ref 15–41)
Albumin: 3.9 g/dL (ref 3.5–5.0)
Alkaline Phosphatase: 49 U/L (ref 38–126)
Anion gap: 11 (ref 5–15)
BUN: 20 mg/dL (ref 8–23)
CO2: 24 mmol/L (ref 22–32)
Calcium: 8.8 mg/dL — ABNORMAL LOW (ref 8.9–10.3)
Chloride: 101 mmol/L (ref 98–111)
Creatinine, Ser: 1.49 mg/dL — ABNORMAL HIGH (ref 0.61–1.24)
GFR, Estimated: 49 mL/min — ABNORMAL LOW (ref >60)
Glucose, Bld: 146 mg/dL — ABNORMAL HIGH (ref 70–99)
Potassium: 4.1 mmol/L (ref 3.5–5.1)
Sodium: 136 mmol/L (ref 135–145)
Total Bilirubin: 0.5 mg/dL (ref 0.3–1.2)
Total Protein: 6.4 g/dL — ABNORMAL LOW (ref 6.5–8.1)

## 2021-07-12 LAB — MAGNESIUM: Magnesium: 1.7 mg/dL (ref 1.7–2.4)

## 2021-07-12 MED ORDER — SODIUM CHLORIDE 0.9 % IV SOLN
16.0000 mg/kg | Freq: Once | INTRAVENOUS | Status: AC
Start: 2021-07-12 — End: 2021-07-12
  Administered 2021-07-12: 1200 mg via INTRAVENOUS
  Filled 2021-07-12: qty 60

## 2021-07-12 MED ORDER — SODIUM CHLORIDE 0.9% FLUSH
10.0000 mL | Freq: Once | INTRAVENOUS | Status: AC
  Administered 2021-07-12: 10 mL via INTRAVENOUS
  Filled 2021-07-12: qty 10

## 2021-07-12 MED ORDER — HEPARIN SOD (PORK) LOCK FLUSH 100 UNIT/ML IV SOLN
INTRAVENOUS | Status: AC
  Administered 2021-07-12: 500 [IU] via INTRAVENOUS
  Filled 2021-07-12: qty 5

## 2021-07-12 MED ORDER — SODIUM CHLORIDE 0.9 % IV SOLN
Freq: Once | INTRAVENOUS | Status: AC
  Filled 2021-07-12: qty 250

## 2021-07-12 MED ORDER — HEPARIN SOD (PORK) LOCK FLUSH 100 UNIT/ML IV SOLN
500.0000 [IU] | Freq: Once | INTRAVENOUS | Status: AC
  Filled 2021-07-12: qty 5

## 2021-07-12 NOTE — Assessment & Plan Note (Signed)
#  Multiple myeloma: Status post autologous and cell transplant-s/p relapse currently on daratumumab- M spike fluctuates between 0.0 - 0.5 gm/dL in the past year. June 2022- M protein- NEG [IF; K/L 1.76]. ] PET scan on 08/14/2018 revealed no evidence of active myeloma. Bone survey on 11/10/2020 revealed no focal lytic lesions. There were stable compression fractions of T5, T10 and T12. SEP 2022- M protein-01.; K/l- 1.85- STABLE.    #  proceed with daratumumab today. Labs today reviewed;  acceptable for treatment today. SQ dex; dexamethasone 24 mg weekly on pre-meds [from home]  # PN/restless leg syndrome- [sec to thalilomid]- on baclofen 5-10 qhs. Recommend accupuncture   # Chronic diarrhea: [2-3 times a day]on immodium prn- STABLE.    # ONJ- discontinued Zometa- STABLE.    # History of pulmonary embolism/ atrial fibrillation [Dr.Kowalski] on Eliquis-STABLE.    # CKD- stage III- STABLE.     # Compression fractures sec to MM- s/p Pain pump [Dr.Naveira; 2012]; STABLE.    # DISPOSITION: wants 9:30 appts # Proceed with daratumumab today.  # follow up in 4 weeks- MD; labs [facility] with diff, CMP, Mg, MM panel;K/L ratio]- [facility] IV- Dr.B

## 2021-07-12 NOTE — Progress Notes (Signed)
[facility] NOTE  Patient Care Team: Sofie Hartigan, MD as PCP - General (Family Medicine) Corey Skains, MD as Consulting Physician (Internal Medicine) Crissie Sickles, MD as Referring Physician (Hematology and Oncology) Lucilla Lame, MD as Consulting Physician (Gastroenterology) Milinda Pointer, MD as Referring Physician (Pain Medicine) Milinda Pointer, MD as Referring Physician (Pain Medicine) Cammie Sickle, MD as Consulting Physician (Hematology and Oncology)  CHIEF COMPLAINTS/PURPOSE OF CONSULTATION: MULTIPLE MYELOMA  #  Oncology History Overview Note  with stage III mutiple myeloma.  He initially presented with progressive back pain beginning in 12/2006.  MRI revealed "spots and compression fractures".  He began Velcade, thalidomide, and Decadron.  In 08/2007, he underwent high dose chemotherapy and autologous stem cell transplant.  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   He recurred with a rising M-spike (2.7) with repeat M spike (1.7 gm/dl) in 03/2010.  He was initially treated with Velcade (02/08/2010 - 05/10/2010).  He then began Revlimid (15 mg 3 weeks on/1 week off) and Decadron (40 mg on day 1, 8, 15, 22).  Because of significant side effect with Decadron his dose was decreased to 10 mg once a week in 07/2010.     He was on maintenance Revlimid.  Revlimid was initially10 mg 3 weeks on/1 week off.  This was changed to 10 mg 2 weeks on/2 weeks off secondary to right nipple tenderness.  His dose was increased to 10 mg 3 weeks on/1 week off with Decadron 10 mg a week (on Sundays) and then Revlamid 15 mg 3 weeks on and 1 week off with Decadron on Sundays.  He began Pomalyst 4 mg 3 weeks on/1 week off with Decadron on 08/27/2015.   SPEP revealed no monoclonal protein (04/21/2015) and 0.5 gm/dL on 09/22/2015 and 10/20/2015.  M spike was 0.1 on 02/02/2016, 03/01/2016, 03/29/2016, 04/26/2016, and 05/24/2016.  M spike was  0 on 10/11/2016, 11/28/2016, 02/05/2017, 03/21/2017, 08/02/2017, 10/04/2017, 12/27/2017, 01/24/2018, and 02/21/2018.  M spike was 0.1 on 11/01/2017, 0.1 on 11/29/2017, 0 on 12/27/2017, 0 on 01/24/2018, 0 on 02/21/2018, 0.1 on 03/21/2018, 0.3 on 04/18/2018, 0.1 on 05/16/2018, 0 on 06/13/2018, 0.2 on 08/08/2018, 0 on 09/09/2018, 0.1 on 11/04/2018, 0 on 12/02/2018, 0.1 on 12/30/2018, 0 on 01/27/2019, 0.1 on 02/24/2019, 0.1 on 03/24/2019, 0.1 on 04/23/2019, 0 on 05/19/2019, 0.1 on 06/17/2019, 0.2 on 07/15/2019, 0.2 on 08/18/2019, 0.2 on 09/15/2019, 0.1 on 10/13/2019, 0.1 on 11/13/2019, 0 on 12/11/2019, 0.3 on 01/08/2020, 0.2 on 02/05/2020, 0.2 on 03/04/2020, 0.2 on 04/01/2020, 0 on 05/05/2020, 0.4 on 06/07/2020, 0.4 on 07/06/2020, 0.3 on 08/03/2020, 0.1 on 08/31/2020, 0.3 on 09/28/2020, and 0.2 on 10/26/2020.    Free light chains have been monitored.  Kappa free light chains were 18.54 on 11/21/2013, 18.37 on 02/20/2014, 18.93 on 05/22/2014, 32.58 (high; normal ratio 1.27) on 08/21/2014, 51.53 (high; elevated ratio of 2.12) on 11/13/2014, 28.08 (ratio 1.73) on 12/09/2014, 23.71 (ratio 2.17) on 01/18/2015, 92.93 (ratio 9.49) on 04/21/2015, 93.44 (ratio 10.28) on 05/26/2015, 255.45 (ratio of 24.05) on 07/14/2015, 373.89 (ratio 48.31) on 08/04/2015, 474.33 (ratio 70.58) on 08/25/2015, 450.76 (ratio 55.44) on 09/22/2015, 453.4 (ratio 59.89) on 10/20/2015, 58.26 (ratio of > 40.74) on 02/02/2016, 62.67 (ratio of 37.98) on 03/01/2016, 75.7 (ratio > 50.47) on 03/29/2016, 79.7 (ratio 53.13) on 04/26/2016, 108.6 (ratio > 72.4) on 05/24/2016, 8.3 (1.46 ratio) on 10/11/2016, 6.7 (0.81 ratio) on 02/05/2017, 7.8 (1.01 ratio) on 03/21/2017,7.6 (ratio 0.63) on 06/01/2017, 8.1 (ratio 1.13) on 11/29/2017, 9.6 (ratio 1.55) on  06/13/2018, 6.6 (ratio 2.06) on 07/11/2018, 6.9 (ratio 0.82) on 08/08/2018, 5.8 (ratio 1.66) on 09/09/2018, 5.8 (ratio 1.05) on 11/04/2018, 5.6 (ratio 2.55) on 12/02/2018, 5.5 (ratio 1.96) on 01/27/2019, 7.6 (ratio  2.38) on 06/17/2019, 6.9 (ratio 1.21) on 11/13/2019, 6.6 (ratio 2.0) on 12/11/2019, 7.9 (ratio 2.14) on 02/05/2020, 6.6 (ratio 2.28) on 04/01/2020, 7.0 (ratio 2.12) on 05/05/2020, 9.3 (ratio 2.16) on 07/06/2020,  7.1 (ratio 2.09) on 08/31/2020, and 6.9 (ratio 2.09) on 10/26/2020.   Bone survey on 12/08/2014 was stable.  Bone survey on 10/21/2015 revealed increase conspicuity of subcentimeter lytic lesions in the calvarium.  PET scan on 08/14/2018 revealed no evidence of active myeloma.  Bone survey on 11/04/2019 revealed stable small lytic lesions throughout the skull.  There were no other lytic lesions.   Bone marrow aspirate and biopsy on 11/04/2015 revealed an atypical monoclonal plasma cells estimated at 30-40% of marrow cells.   Marrow was variably cellular (approximately 45%) with background trilineage hematopoiesis. There was no significant increase in marrow reticulin fibers. Storage iron was present.     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  He develoed herpes zoster in 04/2008.  He developed a pulmonary embolism in 05/2013.  He was initially on Xarelto, but is now on Eliquis.  He had an episode of pneumonia around this time requiring a brief admission.  He developed severe lower leg cramps on 08/07/2014 secondary to hypokalemia.  Duplex was negative.   He was treated for C difficile colitis (Flagyl completed 07/30/2015).  He has a chronic indwelling pain pump.   He received 4 cycles of Pomalyst and Decadron (08/27/2015 - 11/19/2015).  Restaging studies document progressive disease.  Kappa free light chains are increasing.  SPEP revealed 0.5 gm/dL monoclonal protein then 1.3 gm/dL.  Bone survey reveals increase conspicuity of subcentimeter lytic lesions in the calvarium.  Bone marrow reveals 30-40% plasma cells.   MUGA on 11/30/2015 revealed an ejection fraction of 46%.  He is felt not to be a good candidate for Kyprolis.  There were  no focal wall motion abnormalities. He had a stress echo less than 1 year ago.  He has a history of PVCs and atrial fibrillation.  He takes Cardizem prn.   He received 17 weeks of daratumumab (Darzalex) (12/09/2015 - 05/25/2016).  He tolerated treatment well without side effect.   Bone marrow on 02/09/2016 revealed no diagnostic morphologic evidence of plasma cell myeloma.  Marrow was normocellular to hypocellular marrow for age (ranging from 10-40%) with maturing trilineage hematopoiesis and mild multilineage dyspoiesis.  There was patchy mild increase in reticulin.  Storage iron was present.  Flow cytometry revealed no definitive evidence of monoclonality.  There was a non-specific atypical myeloid and monocytic findings with no increase in blasts.  Cytogenetics were normal (46, XY).   He is currently 54 months s/p 2nd autologous stem cell transplant on 06/16/2016.  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  He also experienced atrial fibrillation with intermittent episodes of RVR requiring IV beta blockers.  He is on prophylactic valacyclovir for 1 year post transplant.   He started vaccinations (DTaP-Pediatric triple vaccine, Hep B- Pediatrix triple vaccine, Haemophilus influenza B (Hib), inactivated polio virus (IPV), pneumococcal conjugate vaccine 13-valent (PCV 13)) on 04/17/2017.  Recommendation was for Shingrix vaccine to be given in [place] and second dose of Shingrix in 2 months.  He had follow-up vaccinations on 08/28/2017.  He had his second  shingles vaccine. He received 18 month vaccinations - DTaP-Pediatric triple vaccine, Hep B- Pediatrix triple vaccine, Haemophilus influenza B (Hib), inactivated polio virus (IPV), pneumococcal conjugate vaccine 13-valent (PCV 13) on 02/05/2018.   He is s/p 52 cycles of daratumumab (Darzalex) post transplant (12/05/2016 - 12/27/20).   PET scan on 08/14/2018 revealed no evidence of active myeloma on  whole-body FDG PET scan.  There was no evidence soft tissue plasmacytoma.  There was no clear evidence of lytic lesions on the CT portion exam. Some lucencies in the pelvis were stable. There were chronic compression fractures in the lower thoracic spine.  Bone survey on 11/04/2019 revealed stable small lytic lesions noted throughout the skull.  There were no other lytic lesions. Exam was stable.  Bone survey on 11/10/2020 revealed no focal lytic lesions.  There are stable compression fractures of T5, T10 and T12.    He has chronic diarrhea. GI panel by PCR was + enteropathogenic E coli (EPEC) on 11/30/2020 and 12/13/2020.  Stool was + for adenovirus on 11/30/2020.  Stool was negative for EPEC on 12/22/2020.  He was treated with ciprofloxacin (last 12/21/2020).   He has a history of hypomagnesemia that required IV magnesium (2-4 gm) weekly.  He has not required magnesium since 10/24/2016.  Patient has atrial fibrillation and is on Eliquis.   Multiple myeloma in remission ([place])  09/19/2016 Initial Diagnosis   Multiple myeloma in remission ([place])      HISTORY OF PRESENTING ILLNESS:  Patient is alone. Ambulating independently.   Logan Perez 75 y.o.  male multiple myeloma currently on daratumumab/Dex is here for follow up.  No hospitalizations.  No infections.  Chronic back pain not any worse.  Chronic cleaning illness.  Chronic mild diarrhea not any worse.   Review of Systems  Constitutional:  Negative for chills, diaphoresis, fever, malaise/fatigue and weight loss.  HENT:  Negative for nosebleeds and sore throat.   Eyes:  Negative for double vision.  Respiratory:  Negative for cough, hemoptysis, sputum production, shortness of breath and wheezing.   Cardiovascular:  Negative for chest pain, palpitations, orthopnea and leg swelling.  Gastrointestinal:  Positive for diarrhea. Negative for abdominal pain, blood in stool, constipation, heartburn, melena, nausea and vomiting.   Genitourinary:  Negative for dysuria, frequency and urgency.  Musculoskeletal:  Positive for back pain and joint pain.  Skin: Negative.  Negative for itching and rash.  Neurological:  Positive for tingling. Negative for dizziness, focal weakness, weakness and headaches.  Endo/Heme/Allergies:  Does not bruise/bleed easily.  Psychiatric/Behavioral:  Negative for depression. The patient is not nervous/anxious and does not have insomnia.     MEDICAL HISTORY:  Past Medical History:  Diagnosis Date   Anxiety    Atrial fibrillation (HCC)    BPH (benign prostatic hyperplasia)    Complication of anesthesia    BAD HEADACHE NIGHT OF FIRST CATARACT   Compression fracture of lumbar vertebra (HCC)    Difficulty voiding    Dysrhythmia    A FIB   Elevated PSA    GERD (gastroesophageal reflux disease)    Hearing aid worn    bilateral   History of kidney stones    HLD (hyperlipidemia)    HOH (hard of hearing)    Hypertension    Multiple myeloma (HCC)    Neuropathy    feet. 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   Pain    BACK   Palpitations    Pneumonia    Pulmonary embolism ([place])    Sepsis ([place])  Stroke [facility])    TIA, detected on CT scan. pt was unaware    SURGICAL HISTORY: Past Surgical History:  Procedure Laterality Date   BACK SURGERY  1994   CATARACT EXTRACTION W/PHACO Right 05/02/2016   Procedure: CATARACT EXTRACTION PHACO AND INTRAOCULAR LENS PLACEMENT ([place]);  Surgeon: Birder Robson, MD;  Location: [facility];  Service: Ophthalmology;  Laterality: Right;  [place] 1.06 AP% 20.6 CDE 13.70 FLUID PACK LOT # [id number]   CATARACT EXTRACTION W/PHACO Left 05/16/2016   Procedure: CATARACT EXTRACTION PHACO AND INTRAOCULAR LENS PLACEMENT ([facility]);  Surgeon: Birder Robson, MD;  Location: [facility];  Service: Ophthalmology;  Laterality: Left;  [place] 01:43 AP% 19.8 CDE 20.45 FLUID PACK LOT #[id number] H   COLONOSCOPY WITH PROPOFOL N/A 03/22/2017   Procedure: COLONOSCOPY WITH PROPOFOL;  Surgeon: Lucilla Lame, MD;   Location: [place];  Service: Endoscopy;  Laterality: N/A;  has port   ESOPHAGOGASTRODUODENOSCOPY (EGD) WITH PROPOFOL  03/22/2017   Procedure: ESOPHAGOGASTRODUODENOSCOPY (EGD) WITH PROPOFOL;  Surgeon: Lucilla Lame, MD;  Location: [place];  Service: Endoscopy;;   EYE SURGERY     KNEE ARTHROSCOPY Left 1992   LIMBAL STEM CELL TRANSPLANT     PAIN PUMP IMPLANTATION  2012   PAIN PUMP IMPLANTATION N/A 09/04/2017   Procedure: INTRATHECAL PUMP BATTERY CHANGE;  Surgeon: Milinda Pointer, MD;  Location: [facility];  Service: Neurosurgery;  Laterality: N/A;   PORTA CATH INSERTION N/A 12/04/2016   Procedure: Glori Luis Cath Insertion;  Surgeon: Algernon Huxley, MD;  Location: [facility];  Service: Cardiovascular;  Laterality: N/A;   stem cell implant  2008   [facility]    SOCIAL HISTORY: Social History   Socioeconomic History   Marital status: Married    Spouse name: Not on file   Number of children: Not on file   Years of education: Not on file   Highest education level: Not on file  Occupational History   Not on file  Tobacco Use   Smoking status: Former    Packs/day: 2.50    Years: 12.00    Pack years: 30.00    Types: Cigarettes    Quit date: 65    Years since quitting: 45.7   Smokeless tobacco: Never   Tobacco comments:    Quit 40 years ago  Electronics engineer Use   Vaping Use: Never used  Substance and Sexual Activity   Alcohol use: No    Alcohol/week: 0.0 standard drinks    Comment: might have 1 beer/month   Drug use: No   Sexual activity: Not on file  Other Topics Concern   Not on file  Social History Narrative   Not on file   Social Determinants of Health   Financial Resource Strain: Not on file  Food Insecurity: Not on file  Transportation Needs: Not on file  Physical Activity: Not on file  Stress: Not on file  Social Connections: Not on file  Intimate Partner Violence: Not on file    FAMILY HISTORY: Family History  Problem Relation Age of Onset    Cancer Father        throat   Kidney disease Sister    Stroke Other    Stroke Mother    Bladder Cancer Neg Hx    Prostate cancer Neg Hx    Kidney cancer Neg Hx     ALLERGIES:  is allergic to ciprofloxacin, azithromycin, bee pollen, pollen extract, zoledronic acid, and rivaroxaban.  MEDICATIONS:  Current Outpatient Medications  Medication Sig Dispense Refill  acetaminophen (TYLENOL) 500 MG tablet Take 500 mg by mouth 2 (two) times a day.     baclofen (LIORESAL) 10 MG tablet Take 1 tablet by mouth 2 (two) times daily.     calcium citrate-vitamin D (CITRACAL+D) 315-200 MG-UNIT tablet Take 1 tablet by mouth 2 (two) times daily.     Cholecalciferol (VITAMIN D3) 2000 units capsule Take 2,000 Units daily by mouth.      Daratumumab (DARZALEX IV) Inject 1,200 mg every 30 (thirty) days into the vein.      dexamethasone (DECADRON) 4 MG tablet TAKE 4 TABLETS BY MOUTH 1 HOUR PRIOR TO INFUSION, THEN 1 TABLET ON DAYS 2 AND 3 AS DIRECTED 30 tablet 0   diltiazem (CARDIZEM CD) 120 MG 24 hr capsule Take 120 mg daily by mouth.      diphenhydrAMINE (BENADRYL) 25 MG tablet Take 25 mg by mouth as directed. With chemo treatment     ELIQUIS 5 MG TABS tablet TAKE 1 TABLET TWICE A DAY 180 tablet 1   Hypromellose (ARTIFICIAL TEARS OP) Place 1 drop into both eyes as needed (for dry eyes).     lovastatin (MEVACOR) 20 MG tablet Take 20 mg by mouth every evening.      Multiple Vitamins-Iron (MULTIVITAMIN/IRON PO) Take 1 tablet by mouth daily.      NON FORMULARY IT pump  Fentanyl 1,500.0 mcg/ml Bupivicaine 30.0 mg/ml Clonidine 300.0 mcg/ml Rate 500.4 mg/day     omeprazole (PRILOSEC) 20 MG capsule Take 20 mg by mouth daily.      PAIN MANAGEMENT INTRATHECAL, IT, PUMP 1 each by Intrathecal route. Intrathecal (IT) medication:  Fentanyl 1500 mcg/ml, bupivicaine 30.0 mg/ml, clonidine 300.0 mcg/ml Daily dose fent,.500.107mg/day, bupivacaine 10.0020mday, clonidine 1000.07 mcg/day     tamsulosin (FLOMAX) 0.4 MG CAPS  capsule Take 1 capsule (0.4 mg total) by mouth daily. 90 capsule 3   valACYclovir (VALTREX) 500 MG tablet TAKE 1 TABLET DAILY 90 tablet 3   vitamin B-12 (CYANOCOBALAMIN) 1000 MCG tablet Take 1,000 mcg daily by mouth.      bismuth subsalicylate (PEPTO BISMOL) 262 MG/15ML suspension Take 30 mLs by mouth every 6 (six) hours as needed for diarrhea or loose stools. (Patient not taking: No sig reported)     cetirizine (ZYRTEC) 10 MG tablet Take 10 mg by mouth daily as needed for allergies.  (Patient not taking: No sig reported)     diltiazem (CARDIZEM) 60 MG tablet Take 60 mg by mouth daily as needed (for increased heart rate > 140). (Patient not taking: No sig reported)     fluticasone (FLONASE) 50 MCG/ACT nasal spray Place 1 spray into the nose daily as needed for allergies. (Patient not taking: No sig reported)     loperamide (IMODIUM) 2 MG capsule Take 4 mg by mouth as needed for diarrhea or loose stools. (Patient not taking: No sig reported)     loratadine (CLARITIN) 10 MG tablet Take 10 mg by mouth daily as needed.  (Patient not taking: No sig reported)     montelukast (SINGULAIR) 10 MG tablet Take 1 tablet by mouth on the day before, day of, and 2 days after treatment. (Patient not taking: No sig reported) 30 tablet 1   ondansetron (ZOFRAN) 8 MG tablet Take 1 tablet (8 mg total) by mouth as directed. Take with chemo and can take it as needed (Patient not taking: No sig reported) 20 tablet 1   potassium chloride SA (KLOR-CON) 20 MEQ tablet TAKE 1 TABLET(20 MEQ) BY MOUTH DAILY (Patient not  taking: No sig reported) 90 tablet 0   No current facility-administered medications for this visit.   Facility-Administered Medications Ordered in Other Visits  Medication Dose Route Frequency Provider Last Rate Last Admin   heparin lock flush 100 unit/mL  500 Units Intravenous Once Charlaine Dalton R, MD          .  PHYSICAL EXAMINATION: ECOG PERFORMANCE STATUS: 1 - Symptomatic but completely  ambulatory  Vitals:   07/12/21 0837  BP: 100/71  Pulse: 89  Resp: 18  Temp: (!) 97.5 F (36.4 C)  SpO2: 100%   Filed Weights   07/12/21 0834  Weight: 167 lb (75.8 kg)    Physical Exam Vitals and nursing note reviewed.  HENT:     Head: Normocephalic and atraumatic.     Mouth/Throat:     Mouth: Mucous membranes are moist.     Pharynx: No oropharyngeal exudate.  Eyes:     Pupils: Pupils are equal, round, and reactive to light.  Cardiovascular:     Rate and Rhythm: Normal rate and regular rhythm.  Pulmonary:     Effort: No respiratory distress.     Breath sounds: No wheezing.     Comments: Decreased breath sounds bilaterally at bases.  No wheeze or crackles Abdominal:     General: Bowel sounds are normal. There is no distension.     Palpations: Abdomen is soft. There is no mass.     Tenderness: There is no abdominal tenderness. There is no guarding or rebound.  Musculoskeletal:        General: No tenderness. Normal range of motion.     Cervical back: Normal range of motion and neck supple.  Skin:    General: Skin is warm.  Neurological:     Mental Status: He is alert and oriented to person, place, and time.  Psychiatric:        Mood and Affect: Affect normal.        Judgment: Judgment normal.    LABORATORY DATA:  I have reviewed the data as listed Lab Results  Component Value Date   WBC 8.0 07/12/2021   HGB 12.6 (L) 07/12/2021   HCT 36.8 (L) 07/12/2021   MCV 103.1 (H) 07/12/2021   PLT 196 07/12/2021   Recent Labs    05/16/21 0834 06/14/21 0827 07/12/21 0823  NA 134* 135 136  K 3.6 4.0 4.1  CL 101 102 101  CO2 _0 GLUCOSE 161* 110* 146*  BUN _1 CREATININE 1.42* 1.30* 1.49*  CALCIUM 8.8* 9.0 8.8*  GFRNONAA 52* 57* 49*  PROT 6.1* 6.8 6.4*  ALBUMIN 3.8 3.8 3.9  AST _2 ALT _3 ALKPHOS 51 57 49  BILITOT 0.5 0.7 0.5    RADIOGRAPHIC STUDIES: I have personally reviewed the radiological images as listed and agreed with the  findings in the report. MR PELVIS W WO CONTRAST  Result Date: 06/19/2021 CLINICAL DATA:  History of multiple myeloma, palpable right lateral hip mass EXAM: MRI PELVIS WITHOUT AND WITH CONTRAST TECHNIQUE: Multiplanar multisequence MR imaging of the pelvis was performed both before and after administration of intravenous contrast. CONTRAST:  7.35m GADAVIST GADOBUTROL 1 MMOL/ML IV SOLN COMPARISON:  PET-CT, 08/14/2018 FINDINGS: Urinary Tract:  No abnormality visualized. Bowel:  Unremarkable visualized pelvic bowel loops. Vascular/Lymphatic: No pathologically enlarged lymph nodes. No significant vascular abnormality seen. Reproductive:  No mass or other significant abnormality Other:  None. Musculoskeletal: Diffusely heterogeneous bone marrow. There is a  subcutaneous calcification overlying the right greater trochanter measuring 1.0 cm at the site of marked abnormality (series 16, image 29). This is generally unchanged in comparison to PET-CT examination performed 08/14/2018. Significant fatty atrophy of the anterior fibers of the right gluteus medius and gluteus minimus (series 15, image 13). IMPRESSION: 1. Small subcutaneous calcification overlying the right greater trochanter at the site of marked abnormality. This is generally unchanged in comparison to PET-CT examination performed 08/14/2018 and posttraumatic/dystrophic in nature. There is additional fatty atrophy of the anterior fibers of the right gluteus medius and minimus musculature. Constellation of findings is consistent with sequelae of prior trauma. No suspicious mass or contrast enhancement. 2. Diffusely heterogeneous bone marrow, in keeping with history of multiple myeloma. Electronically Signed   By: Eddie Candle M.D.   On: 06/19/2021 14:03    ASSESSMENT & PLAN:   No problem-specific Assessment & Plan notes found for this encounter.  All questions were answered. The patient knows to call the clinic with any problems, questions or concerns.     Cammie Sickle, MD 07/12/2021 9:19 AM

## 2021-07-12 NOTE — Progress Notes (Signed)
Patient reports ongoing neuropathy in feet and loose stools.

## 2021-07-12 NOTE — Patient Instructions (Signed)
[facility] ONCOL[facility]  Discharge Instructions: Thank you for choosing [facility] to provide your oncology and hematology care.  If you have a lab appointment with the [facility], please go directly to the [facility] and check in at the registration area.  Wear comfortable clothing and clothing appropriate for easy access to any Portacath or PICC line.   We strive to give you quality time with your provider. You may need to reschedule your appointment if you arrive late (15 or more minutes).  Arriving late affects you and other patients whose appointments are after yours.  Also, if you miss three or more appointments without notifying the office, you may be dismissed from the clinic at the provider's discretion.      For prescription refill requests, have your pharmacy contact our office and allow 72 hours for refills to be completed.    Today you received the following chemotherapy and/or immunotherapy agents Darzalex      To help prevent nausea and vomiting after your treatment, we encourage you to take your nausea medication as directed.  BELOW ARE SYMPTOMS THAT SHOULD BE REPORTED IMMEDIATELY: *FEVER GREATER THAN 100.4 F (38 C) OR HIGHER *CHILLS OR SWEATING *NAUSEA AND VOMITING THAT IS NOT CONTROLLED WITH YOUR NAUSEA MEDICATION *UNUSUAL SHORTNESS OF BREATH *UNUSUAL BRUISING OR BLEEDING *URINARY PROBLEMS (pain or burning when urinating, or frequent urination) *BOWEL PROBLEMS (unusual diarrhea, constipation, pain near the anus) TENDERNESS IN MOUTH AND THROAT WITH OR WITHOUT PRESENCE OF ULCERS (sore throat, sores in mouth, or a toothache) UNUSUAL RASH, SWELLING OR PAIN  UNUSUAL VAGINAL DISCHARGE OR ITCHING   Items with * indicate a potential emergency and should be followed up as soon as possible or go to the Emergency Department if any problems should occur.  Please show the CHEMOTHERAPY ALERT CARD or IMMUNOTHERAPY ALERT CARD at check-in to  the Emergency Department and triage nurse.  Should you have questions after your visit or need to cancel or reschedule your appointment, please contact [facility]  [phone] and follow the prompts.  Office hours are 8:00 a.m. to 4:30 p.m. Monday - Friday. Please note that voicemails left after 4:00 p.m. may not be returned until the following business day.  We are closed weekends and major holidays. You have access to a nurse at all times for urgent questions. Please call the main number to the clinic [phone] and follow the prompts.  For any non-urgent questions, you may also contact your provider using MyChart. We now offer e-Visits for anyone 4 and older to request care online for non-urgent symptoms. For details visit [email or web address].   Also download the MyChart app! Go to the app store, search "MyChart", open the app, select [facility], and log in with your MyChart username and password.  Due to Covid, a mask is required upon entering the hospital/clinic. If you do not have a mask, one will be given to you upon arrival. For doctor visits, patients may have 1 support person aged 67 or older with them. For treatment visits, patients cannot have anyone with them due to current Covid guidelines and our immunocompromised population.

## 2021-07-13 LAB — KAPPA/LAMBDA LIGHT CHAINS
Kappa free light chain: 7.6 mg/L (ref 3.3–19.4)
Kappa, lambda light chain ratio: 2.62 — ABNORMAL HIGH (ref 0.26–1.65)
Lambda free light chains: 2.9 mg/L — ABNORMAL LOW (ref 5.7–26.3)

## 2021-07-16 LAB — MULTIPLE MYELOMA PANEL, SERUM
Albumin SerPl Elph-Mcnc: 3.6 g/dL (ref 2.9–4.4)
Albumin/Glob SerPl: 1.8 — ABNORMAL HIGH (ref 0.7–1.7)
Alpha 1: 0.2 g/dL (ref 0.0–0.4)
Alpha2 Glob SerPl Elph-Mcnc: 0.8 g/dL (ref 0.4–1.0)
B-Globulin SerPl Elph-Mcnc: 0.9 g/dL (ref 0.7–1.3)
Gamma Glob SerPl Elph-Mcnc: 0.2 g/dL — ABNORMAL LOW (ref 0.4–1.8)
Globulin, Total: 2.1 g/dL — ABNORMAL LOW (ref 2.2–3.9)
IgA: 16 mg/dL — ABNORMAL LOW (ref 61–437)
IgG (Immunoglobin G), Serum: 244 mg/dL — ABNORMAL LOW (ref 603–1613)
IgM (Immunoglobulin M), Srm: 38 mg/dL (ref 15–143)
Total Protein ELP: 5.7 g/dL — ABNORMAL LOW (ref 6.0–8.5)

## 2021-07-20 ENCOUNTER — Ambulatory Visit: Payer: Medicare Other | Admitting: Gastroenterology

## 2021-07-25 ENCOUNTER — Other Ambulatory Visit: Payer: Self-pay

## 2021-07-25 MED ORDER — PAIN MANAGEMENT IT PUMP REFILL: 1.0000 | Freq: Once | INTRATHECAL | 0 refills | Status: AC

## 2021-08-09 ENCOUNTER — Inpatient Hospital Stay: Payer: Medicare Other | Attending: Internal Medicine

## 2021-08-09 ENCOUNTER — Other Ambulatory Visit: Payer: Self-pay

## 2021-08-09 ENCOUNTER — Encounter: Payer: Self-pay | Admitting: Internal Medicine

## 2021-08-09 ENCOUNTER — Inpatient Hospital Stay: Payer: Medicare Other

## 2021-08-09 ENCOUNTER — Inpatient Hospital Stay (HOSPITAL_BASED_OUTPATIENT_CLINIC_OR_DEPARTMENT_OTHER): Payer: Medicare Other | Admitting: Internal Medicine

## 2021-08-09 VITALS — BP 137/85 | HR 86 | Resp 16

## 2021-08-09 DIAGNOSIS — Z79899 Other long term (current) drug therapy: Secondary | ICD-10-CM | POA: Insufficient documentation

## 2021-08-09 DIAGNOSIS — C9001 Multiple myeloma in remission: Secondary | ICD-10-CM

## 2021-08-09 DIAGNOSIS — C9002 Multiple myeloma in relapse: Secondary | ICD-10-CM

## 2021-08-09 DIAGNOSIS — Z5112 Encounter for antineoplastic immunotherapy: Secondary | ICD-10-CM | POA: Insufficient documentation

## 2021-08-09 LAB — COMPREHENSIVE METABOLIC PANEL
ALT: 16 U/L (ref 0–44)
AST: 24 U/L (ref 15–41)
Albumin: 4.2 g/dL (ref 3.5–5.0)
Alkaline Phosphatase: 44 U/L (ref 38–126)
Anion gap: 9 (ref 5–15)
BUN: 20 mg/dL (ref 8–23)
CO2: 24 mmol/L (ref 22–32)
Calcium: 8.9 mg/dL (ref 8.9–10.3)
Chloride: 100 mmol/L (ref 98–111)
Creatinine, Ser: 1.42 mg/dL — ABNORMAL HIGH (ref 0.61–1.24)
GFR, Estimated: 52 mL/min — ABNORMAL LOW (ref >60)
Glucose, Bld: 177 mg/dL — ABNORMAL HIGH (ref 70–99)
Potassium: 4.1 mmol/L (ref 3.5–5.1)
Sodium: 133 mmol/L — ABNORMAL LOW (ref 135–145)
Total Bilirubin: 0.7 mg/dL (ref 0.3–1.2)
Total Protein: 6.3 g/dL — ABNORMAL LOW (ref 6.5–8.1)

## 2021-08-09 LAB — CBC WITH DIFFERENTIAL/PLATELET
Abs Immature Granulocytes: 0.02 10*3/uL (ref 0.00–0.07)
Basophils Absolute: 0 10*3/uL (ref 0.0–0.1)
Basophils Relative: 0 %
Eosinophils Absolute: 0.1 10*3/uL (ref 0.0–0.5)
Eosinophils Relative: 2 %
HCT: 36.9 % — ABNORMAL LOW (ref 39.0–52.0)
Hemoglobin: 12.7 g/dL — ABNORMAL LOW (ref 13.0–17.0)
Immature Granulocytes: 0 %
Lymphocytes Relative: 16 %
Lymphs Abs: 1.3 10*3/uL (ref 0.7–4.0)
MCH: 34.6 pg — ABNORMAL HIGH (ref 26.0–34.0)
MCHC: 34.4 g/dL (ref 30.0–36.0)
MCV: 100.5 fL — ABNORMAL HIGH (ref 80.0–100.0)
Monocytes Absolute: 0.3 10*3/uL (ref 0.1–1.0)
Monocytes Relative: 4 %
Neutro Abs: 6.2 10*3/uL (ref 1.7–7.7)
Neutrophils Relative %: 78 %
Platelets: 203 10*3/uL (ref 150–400)
RBC: 3.67 MIL/uL — ABNORMAL LOW (ref 4.22–5.81)
RDW: 13.2 % (ref 11.5–15.5)
WBC: 8 10*3/uL (ref 4.0–10.5)
nRBC: 0 % (ref 0.0–0.2)

## 2021-08-09 LAB — MAGNESIUM: Magnesium: 1.9 mg/dL (ref 1.7–2.4)

## 2021-08-09 MED ORDER — HEPARIN SOD (PORK) LOCK FLUSH 100 UNIT/ML IV SOLN
INTRAVENOUS | Status: AC
  Administered 2021-08-09: 500 [IU]
  Filled 2021-08-09: qty 5

## 2021-08-09 MED ORDER — SODIUM CHLORIDE 0.9 % IV SOLN
16.0000 mg/kg | Freq: Once | INTRAVENOUS | Status: AC
  Administered 2021-08-09: 1200 mg via INTRAVENOUS
  Filled 2021-08-09: qty 60

## 2021-08-09 MED ORDER — HEPARIN SOD (PORK) LOCK FLUSH 100 UNIT/ML IV SOLN
500.0000 [IU] | Freq: Once | INTRAVENOUS | Status: AC | PRN
  Filled 2021-08-09: qty 5

## 2021-08-09 MED ORDER — SODIUM CHLORIDE 0.9% FLUSH
10.0000 mL | INTRAVENOUS | Status: DC | PRN
  Filled 2021-08-09: qty 10

## 2021-08-09 MED ORDER — HEPARIN SOD (PORK) LOCK FLUSH 100 UNIT/ML IV SOLN
500.0000 [IU] | Freq: Once | INTRAVENOUS | Status: DC
  Filled 2021-08-09: qty 5

## 2021-08-09 MED ORDER — SODIUM CHLORIDE 0.9% FLUSH
10.0000 mL | Freq: Once | INTRAVENOUS | Status: DC
  Filled 2021-08-09: qty 10

## 2021-08-09 MED ORDER — SODIUM CHLORIDE 0.9 % IV SOLN
Freq: Once | INTRAVENOUS | Status: AC
  Filled 2021-08-09: qty 250

## 2021-08-09 NOTE — Assessment & Plan Note (Addendum)
#  Multiple myeloma: Status post autologous and cell transplant-s/p relapse currently on daratumumab- M spike fluctuates between 0.0 - 0.5 gm/dL in the past year. June 2022- M protein- NEG [IF; K/L 1.76]. ] PET scan on 08/14/2018 revealed no evidence of active myeloma. Bone survey on 11/10/2020 revealed no focal lytic lesions. There were stable compression fractions of T5, T10 and T12. SEP 2022- M protein-01.; K/l- 1.85- STABLE.    #  proceed with daratumumab today. Labs today reviewed;  acceptable for treatment today. SQ dex; dexamethasone 24 mg weekly on pre-meds [from home]  # PN/restless leg syndrome- [sec to thalilomid]- on baclofen 5-10 qhs. Recommend accupuncture   # Chronic diarrhea: [2-3 times a day]on immodium prn-  STABLE.   # ONJ- discontinued Zometa- STABLE.   # History of pulmonary embolism/ atrial fibrillation [Dr.Kowalski] on Eliquis- STABLE.   # CKD- stage III-  STABLE.   # Compression fractures sec to MM- s/p Pain pump [Dr.Naveira; 2012]; STABLE.    # Vaccination: s/p Flu shots [2022]; UNVACCINATED to [facility]. Discussed re: EVUSHELD; will will research about it.   # DISPOSITION: wants 9:30 appts # Proceed with daratumumab today.  # follow up in 4 weeks- MD; labs [facility] with diff, CMP, Mg, MM panel;K/L ratio]- [facility] IV- Dr.B

## 2021-08-09 NOTE — Progress Notes (Signed)
[place] NOTE  Patient Care Team: Sofie Hartigan, MD as PCP - General (Family Medicine) Corey Skains, MD as Consulting Physician (Internal Medicine) Crissie Sickles, MD as Referring Physician (Hematology and Oncology) Lucilla Lame, MD as Consulting Physician (Gastroenterology) Milinda Pointer, MD as Referring Physician (Pain Medicine) Milinda Pointer, MD as Referring Physician (Pain Medicine) Cammie Sickle, MD as Consulting Physician (Hematology and Oncology)  CHIEF COMPLAINTS/PURPOSE OF CONSULTATION: MULTIPLE MYELOMA  #  Oncology History Overview Note  with stage III mutiple myeloma.  He initially presented with progressive back pain beginning in 12/2006.  MRI revealed "spots and compression fractures".  He began Velcade, thalidomide, and Decadron.  In 08/2007, he underwent high dose chemotherapy and autologous stem cell transplant.  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   He recurred with a rising M-spike (2.7) with repeat M spike (1.7 gm/dl) in 03/2010.  He was initially treated with Velcade (02/08/2010 - 05/10/2010).  He then began Revlimid (15 mg 3 weeks on/1 week off) and Decadron (40 mg on day 1, 8, 15, 22).  Because of significant side effect with Decadron his dose was decreased to 10 mg once a week in 07/2010.     He was on maintenance Revlimid.  Revlimid was initially10 mg 3 weeks on/1 week off.  This was changed to 10 mg 2 weeks on/2 weeks off secondary to right nipple tenderness.  His dose was increased to 10 mg 3 weeks on/1 week off with Decadron 10 mg a week (on Sundays) and then Revlamid 15 mg 3 weeks on and 1 week off with Decadron on Sundays.  He began Pomalyst 4 mg 3 weeks on/1 week off with Decadron on 08/27/2015.   SPEP revealed no monoclonal protein (04/21/2015) and 0.5 gm/dL on 09/22/2015 and 10/20/2015.  M spike was 0.1 on 02/02/2016, 03/01/2016, 03/29/2016, 04/26/2016, and 05/24/2016.  M spike was  0 on 10/11/2016, 11/28/2016, 02/05/2017, 03/21/2017, 08/02/2017, 10/04/2017, 12/27/2017, 01/24/2018, and 02/21/2018.  M spike was 0.1 on 11/01/2017, 0.1 on 11/29/2017, 0 on 12/27/2017, 0 on 01/24/2018, 0 on 02/21/2018, 0.1 on 03/21/2018, 0.3 on 04/18/2018, 0.1 on 05/16/2018, 0 on 06/13/2018, 0.2 on 08/08/2018, 0 on 09/09/2018, 0.1 on 11/04/2018, 0 on 12/02/2018, 0.1 on 12/30/2018, 0 on 01/27/2019, 0.1 on 02/24/2019, 0.1 on 03/24/2019, 0.1 on 04/23/2019, 0 on 05/19/2019, 0.1 on 06/17/2019, 0.2 on 07/15/2019, 0.2 on 08/18/2019, 0.2 on 09/15/2019, 0.1 on 10/13/2019, 0.1 on 11/13/2019, 0 on 12/11/2019, 0.3 on 01/08/2020, 0.2 on 02/05/2020, 0.2 on 03/04/2020, 0.2 on 04/01/2020, 0 on 05/05/2020, 0.4 on 06/07/2020, 0.4 on 07/06/2020, 0.3 on 08/03/2020, 0.1 on 08/31/2020, 0.3 on 09/28/2020, and 0.2 on 10/26/2020.    Free light chains have been monitored.  Kappa free light chains were 18.54 on 11/21/2013, 18.37 on 02/20/2014, 18.93 on 05/22/2014, 32.58 (high; normal ratio 1.27) on 08/21/2014, 51.53 (high; elevated ratio of 2.12) on 11/13/2014, 28.08 (ratio 1.73) on 12/09/2014, 23.71 (ratio 2.17) on 01/18/2015, 92.93 (ratio 9.49) on 04/21/2015, 93.44 (ratio 10.28) on 05/26/2015, 255.45 (ratio of 24.05) on 07/14/2015, 373.89 (ratio 48.31) on 08/04/2015, 474.33 (ratio 70.58) on 08/25/2015, 450.76 (ratio 55.44) on 09/22/2015, 453.4 (ratio 59.89) on 10/20/2015, 58.26 (ratio of > 40.74) on 02/02/2016, 62.67 (ratio of 37.98) on 03/01/2016, 75.7 (ratio > 50.47) on 03/29/2016, 79.7 (ratio 53.13) on 04/26/2016, 108.6 (ratio > 72.4) on 05/24/2016, 8.3 (1.46 ratio) on 10/11/2016, 6.7 (0.81 ratio) on 02/05/2017, 7.8 (1.01 ratio) on 03/21/2017,7.6 (ratio 0.63) on 06/01/2017, 8.1 (ratio 1.13) on 11/29/2017, 9.6 (ratio 1.55) on  06/13/2018, 6.6 (ratio 2.06) on 07/11/2018, 6.9 (ratio 0.82) on 08/08/2018, 5.8 (ratio 1.66) on 09/09/2018, 5.8 (ratio 1.05) on 11/04/2018, 5.6 (ratio 2.55) on 12/02/2018, 5.5 (ratio 1.96) on 01/27/2019, 7.6 (ratio  2.38) on 06/17/2019, 6.9 (ratio 1.21) on 11/13/2019, 6.6 (ratio 2.0) on 12/11/2019, 7.9 (ratio 2.14) on 02/05/2020, 6.6 (ratio 2.28) on 04/01/2020, 7.0 (ratio 2.12) on 05/05/2020, 9.3 (ratio 2.16) on 07/06/2020,  7.1 (ratio 2.09) on 08/31/2020, and 6.9 (ratio 2.09) on 10/26/2020.   Bone survey on 12/08/2014 was stable.  Bone survey on 10/21/2015 revealed increase conspicuity of subcentimeter lytic lesions in the calvarium.  PET scan on 08/14/2018 revealed no evidence of active myeloma.  Bone survey on 11/04/2019 revealed stable small lytic lesions throughout the skull.  There were no other lytic lesions.   Bone marrow aspirate and biopsy on 11/04/2015 revealed an atypical monoclonal plasma cells estimated at 30-40% of marrow cells.   Marrow was variably cellular (approximately 45%) with background trilineage hematopoiesis. There was no significant increase in marrow reticulin fibers. Storage iron was present.     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  He develoed herpes zoster in 04/2008.  He developed a pulmonary embolism in 05/2013.  He was initially on Xarelto, but is now on Eliquis.  He had an episode of pneumonia around this time requiring a brief admission.  He developed severe lower leg cramps on 08/07/2014 secondary to hypokalemia.  Duplex was negative.   He was treated for C difficile colitis (Flagyl completed 07/30/2015).  He has a chronic indwelling pain pump.   He received 4 cycles of Pomalyst and Decadron (08/27/2015 - 11/19/2015).  Restaging studies document progressive disease.  Kappa free light chains are increasing.  SPEP revealed 0.5 gm/dL monoclonal protein then 1.3 gm/dL.  Bone survey reveals increase conspicuity of subcentimeter lytic lesions in the calvarium.  Bone marrow reveals 30-40% plasma cells.   MUGA on 11/30/2015 revealed an ejection fraction of 46%.  He is felt not to be a good candidate for Kyprolis.  There were  no focal wall motion abnormalities. He had a stress echo less than 1 year ago.  He has a history of PVCs and atrial fibrillation.  He takes Cardizem prn.   He received 17 weeks of daratumumab (Darzalex) (12/09/2015 - 05/25/2016).  He tolerated treatment well without side effect.   Bone marrow on 02/09/2016 revealed no diagnostic morphologic evidence of plasma cell myeloma.  Marrow was normocellular to hypocellular marrow for age (ranging from 10-40%) with maturing trilineage hematopoiesis and mild multilineage dyspoiesis.  There was patchy mild increase in reticulin.  Storage iron was present.  Flow cytometry revealed no definitive evidence of monoclonality.  There was a non-specific atypical myeloid and monocytic findings with no increase in blasts.  Cytogenetics were normal (46, XY).   He is currently 54 months s/p 2nd autologous stem cell transplant on 06/16/2016.  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  He also experienced atrial fibrillation with intermittent episodes of RVR requiring IV beta blockers.  He is on prophylactic valacyclovir for 1 year post transplant.   He started vaccinations (DTaP-Pediatric triple vaccine, Hep B- Pediatrix triple vaccine, Haemophilus influenza B (Hib), inactivated polio virus (IPV), pneumococcal conjugate vaccine 13-valent (PCV 13)) on 04/17/2017.  Recommendation was for Shingrix vaccine to be given in [place] and second dose of Shingrix in 2 months.  He had follow-up vaccinations on 08/28/2017.  He had his second  shingles vaccine. He received 18 month vaccinations - DTaP-Pediatric triple vaccine, Hep B- Pediatrix triple vaccine, Haemophilus influenza B (Hib), inactivated polio virus (IPV), pneumococcal conjugate vaccine 13-valent (PCV 13) on 02/05/2018.   He is s/p 52 cycles of daratumumab (Darzalex) post transplant (12/05/2016 - 12/27/20).   PET scan on 08/14/2018 revealed no evidence of active myeloma on  whole-body FDG PET scan.  There was no evidence soft tissue plasmacytoma.  There was no clear evidence of lytic lesions on the CT portion exam. Some lucencies in the pelvis were stable. There were chronic compression fractures in the lower thoracic spine.  Bone survey on 11/04/2019 revealed stable small lytic lesions noted throughout the skull.  There were no other lytic lesions. Exam was stable.  Bone survey on 11/10/2020 revealed no focal lytic lesions.  There are stable compression fractures of T5, T10 and T12.    He has chronic diarrhea. GI panel by PCR was + enteropathogenic E coli (EPEC) on 11/30/2020 and 12/13/2020.  Stool was + for adenovirus on 11/30/2020.  Stool was negative for EPEC on 12/22/2020.  He was treated with ciprofloxacin (last 12/21/2020).   He has a history of hypomagnesemia that required IV magnesium (2-4 gm) weekly.  He has not required magnesium since 10/24/2016.  Patient has atrial fibrillation and is on Eliquis.   Multiple myeloma in remission ([place])  09/19/2016 Initial Diagnosis   Multiple myeloma in remission ([place])      HISTORY OF PRESENTING ILLNESS:  Patient is alone. Ambulating independently.   Logan Perez 75 y.o.  male multiple myeloma currently on daratumumab/Dex is here for follow up.  Continues to have tingling and numbness in the lower extremities.  Not any worse.  Chronic back pain.  Chronic mild diarrhea not any worse.  Review of Systems  Constitutional:  Negative for chills, diaphoresis, fever, malaise/fatigue and weight loss.  HENT:  Negative for nosebleeds and sore throat.   Eyes:  Negative for double vision.  Respiratory:  Negative for cough, hemoptysis, sputum production, shortness of breath and wheezing.   Cardiovascular:  Negative for chest pain, palpitations, orthopnea and leg swelling.  Gastrointestinal:  Positive for diarrhea. Negative for abdominal pain, blood in stool, constipation, heartburn, melena, nausea and vomiting.   Genitourinary:  Negative for dysuria, frequency and urgency.  Musculoskeletal:  Positive for back pain and joint pain.  Skin: Negative.  Negative for itching and rash.  Neurological:  Positive for tingling. Negative for dizziness, focal weakness, weakness and headaches.  Endo/Heme/Allergies:  Does not bruise/bleed easily.  Psychiatric/Behavioral:  Negative for depression. The patient is not nervous/anxious and does not have insomnia.     MEDICAL HISTORY:  Past Medical History:  Diagnosis Date   Anxiety    Atrial fibrillation (HCC)    BPH (benign prostatic hyperplasia)    Complication of anesthesia    BAD HEADACHE NIGHT OF FIRST CATARACT   Compression fracture of lumbar vertebra (HCC)    Difficulty voiding    Dysrhythmia    A FIB   Elevated PSA    GERD (gastroesophageal reflux disease)    Hearing aid worn    bilateral   History of kidney stones    HLD (hyperlipidemia)    HOH (hard of hearing)    Hypertension    Multiple myeloma (HCC)    Neuropathy    feet. 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   Pain    BACK   Palpitations    Pneumonia    Pulmonary embolism ([place])    Sepsis (  [place])    Stroke [facility])    TIA, detected on CT scan. pt was unaware    SURGICAL HISTORY: Past Surgical History:  Procedure Laterality Date   BACK SURGERY  1994   CATARACT EXTRACTION W/PHACO Right 05/02/2016   Procedure: CATARACT EXTRACTION PHACO AND INTRAOCULAR LENS PLACEMENT ([place]);  Surgeon: Birder Robson, MD;  Location: [facility];  Service: Ophthalmology;  Laterality: Right;  [place] 1.06 AP% 20.6 CDE 13.70 FLUID PACK LOT # [id number]   CATARACT EXTRACTION W/PHACO Left 05/16/2016   Procedure: CATARACT EXTRACTION PHACO AND INTRAOCULAR LENS PLACEMENT ([facility]);  Surgeon: Birder Robson, MD;  Location: [facility];  Service: Ophthalmology;  Laterality: Left;  [place] 01:43 AP% 19.8 CDE 20.45 FLUID PACK LOT #[id number] H   COLONOSCOPY WITH PROPOFOL N/A 03/22/2017   Procedure: COLONOSCOPY WITH PROPOFOL;  Surgeon: Lucilla Lame, MD;   Location: [place];  Service: Endoscopy;  Laterality: N/A;  has port   ESOPHAGOGASTRODUODENOSCOPY (EGD) WITH PROPOFOL  03/22/2017   Procedure: ESOPHAGOGASTRODUODENOSCOPY (EGD) WITH PROPOFOL;  Surgeon: Lucilla Lame, MD;  Location: [place];  Service: Endoscopy;;   EYE SURGERY     KNEE ARTHROSCOPY Left 1992   LIMBAL STEM CELL TRANSPLANT     PAIN PUMP IMPLANTATION  2012   PAIN PUMP IMPLANTATION N/A 09/04/2017   Procedure: INTRATHECAL PUMP BATTERY CHANGE;  Surgeon: Milinda Pointer, MD;  Location: [facility];  Service: Neurosurgery;  Laterality: N/A;   PORTA CATH INSERTION N/A 12/04/2016   Procedure: Glori Luis Cath Insertion;  Surgeon: Algernon Huxley, MD;  Location: [place] [facility];  Service: Cardiovascular;  Laterality: N/A;   stem cell implant  2008   [facility]    SOCIAL HISTORY: Social History   Socioeconomic History   Marital status: Married    Spouse name: Not on file   Number of children: Not on file   Years of education: Not on file   Highest education level: Not on file  Occupational History   Not on file  Tobacco Use   Smoking status: Former    Packs/day: 2.50    Years: 12.00    Pack years: 30.00    Types: Cigarettes    Quit date: 76    Years since quitting: 45.8   Smokeless tobacco: Never   Tobacco comments:    Quit 40 years ago  Electronics engineer Use   Vaping Use: Never used  Substance and Sexual Activity   Alcohol use: No    Alcohol/week: 0.0 standard drinks    Comment: might have 1 beer/month   Drug use: No   Sexual activity: Not on file  Other Topics Concern   Not on file  Social History Narrative   Not on file   Social Determinants of Health   Financial Resource Strain: Not on file  Food Insecurity: Not on file  Transportation Needs: Not on file  Physical Activity: Not on file  Stress: Not on file  Social Connections: Not on file  Intimate Partner Violence: Not on file    FAMILY HISTORY: Family History  Problem Relation Age of Onset    Cancer Father        throat   Kidney disease Sister    Stroke Other    Stroke Mother    Bladder Cancer Neg Hx    Prostate cancer Neg Hx    Kidney cancer Neg Hx     ALLERGIES:  is allergic to ciprofloxacin, azithromycin, bee pollen, pollen extract, zoledronic acid, and rivaroxaban.  MEDICATIONS:  Current Outpatient Medications  Medication Sig  Dispense Refill   acetaminophen (TYLENOL) 500 MG tablet Take 500 mg by mouth 2 (two) times a day.     baclofen (LIORESAL) 10 MG tablet Take 1 tablet by mouth 2 (two) times daily.     bismuth subsalicylate (PEPTO BISMOL) 262 MG/15ML suspension Take 30 mLs by mouth every 6 (six) hours as needed for diarrhea or loose stools.     calcium citrate-vitamin D (CITRACAL+D) 315-200 MG-UNIT tablet Take 1 tablet by mouth 2 (two) times daily.     Daratumumab (DARZALEX IV) Inject 1,200 mg every 30 (thirty) days into the vein.      dexamethasone (DECADRON) 4 MG tablet TAKE 4 TABLETS BY MOUTH 1 HOUR PRIOR TO INFUSION, THEN 1 TABLET ON DAYS 2 AND 3 AS DIRECTED 30 tablet 0   diltiazem (CARDIZEM CD) 120 MG 24 hr capsule Take 120 mg daily by mouth.      diphenhydrAMINE (BENADRYL) 25 MG tablet Take 25 mg by mouth as directed. With chemo treatment     ELIQUIS 5 MG TABS tablet TAKE 1 TABLET TWICE A DAY 180 tablet 1   fluticasone (FLONASE) 50 MCG/ACT nasal spray Place 1 spray into the nose daily as needed for allergies.     Hypromellose (ARTIFICIAL TEARS OP) Place 1 drop into both eyes as needed (for dry eyes).     loperamide (IMODIUM) 2 MG capsule Take 4 mg by mouth as needed for diarrhea or loose stools.     loratadine (CLARITIN) 10 MG tablet Take 10 mg by mouth daily as needed.     lovastatin (MEVACOR) 20 MG tablet Take 20 mg by mouth every evening.      montelukast (SINGULAIR) 10 MG tablet Take 1 tablet by mouth on the day before, day of, and 2 days after treatment. 30 tablet 1   Multiple Vitamins-Iron (MULTIVITAMIN/IRON PO) Take 1 tablet by mouth daily.      NON  FORMULARY IT pump  Fentanyl 1,500.0 mcg/ml Bupivicaine 30.0 mg/ml Clonidine 300.0 mcg/ml Rate 500.4 mg/day     omeprazole (PRILOSEC) 20 MG capsule Take 20 mg by mouth daily.      PAIN MANAGEMENT INTRATHECAL, IT, PUMP 1 each by Intrathecal route. Intrathecal (IT) medication:  Fentanyl 1500 mcg/ml, bupivicaine 30.0 mg/ml, clonidine 300.0 mcg/ml Daily dose fent,.500.55mg/day, bupivacaine 10.0073mday, clonidine 1000.07 mcg/day     tamsulosin (FLOMAX) 0.4 MG CAPS capsule Take 1 capsule (0.4 mg total) by mouth daily. 90 capsule 3   valACYclovir (VALTREX) 500 MG tablet TAKE 1 TABLET DAILY 90 tablet 3   vitamin B-12 (CYANOCOBALAMIN) 1000 MCG tablet Take 1,000 mcg daily by mouth.      cetirizine (ZYRTEC) 10 MG tablet Take 10 mg by mouth daily as needed for allergies.  (Patient not taking: No sig reported)     Cholecalciferol (VITAMIN D3) 2000 units capsule Take 2,000 Units daily by mouth.      diltiazem (CARDIZEM) 60 MG tablet Take 60 mg by mouth daily as needed (for increased heart rate > 140). (Patient not taking: Reported on 08/09/2021)     ondansetron (ZOFRAN) 8 MG tablet Take 1 tablet (8 mg total) by mouth as directed. Take with chemo and can take it as needed (Patient not taking: No sig reported) 20 tablet 1   No current facility-administered medications for this visit.   Facility-Administered Medications Ordered in Other Visits  Medication Dose Route Frequency Provider Last Rate Last Admin   heparin lock flush 100 unit/mL  500 Units Intravenous Once BrCammie SickleMD  heparin lock flush 100 unit/mL  500 Units Intracatheter Once PRN Cammie Sickle, MD       sodium chloride flush (NS) 0.9 % injection 10 mL  10 mL Intravenous Once Charlaine Dalton R, MD       sodium chloride flush (NS) 0.9 % injection 10 mL  10 mL Intracatheter PRN Cammie Sickle, MD          .  PHYSICAL EXAMINATION: ECOG PERFORMANCE STATUS: 1 - Symptomatic but completely ambulatory  Vitals:    08/09/21 1021 08/09/21 1023  BP:  118/74  Pulse:  80  Resp: 20   Temp: 98.4 F (36.9 C)    Filed Weights   08/09/21 1021  Weight: 164 lb (74.4 kg)    Physical Exam Vitals and nursing note reviewed.  HENT:     Head: Normocephalic and atraumatic.     Mouth/Throat:     Mouth: Mucous membranes are moist.     Pharynx: No oropharyngeal exudate.  Eyes:     Pupils: Pupils are equal, round, and reactive to light.  Cardiovascular:     Rate and Rhythm: Normal rate and regular rhythm.  Pulmonary:     Effort: No respiratory distress.     Breath sounds: No wheezing.     Comments: Decreased breath sounds bilaterally at bases.  No wheeze or crackles Abdominal:     General: Bowel sounds are normal. There is no distension.     Palpations: Abdomen is soft. There is no mass.     Tenderness: There is no abdominal tenderness. There is no guarding or rebound.  Musculoskeletal:        General: No tenderness. Normal range of motion.     Cervical back: Normal range of motion and neck supple.  Skin:    General: Skin is warm.  Neurological:     Mental Status: He is alert and oriented to person, place, and time.  Psychiatric:        Mood and Affect: Affect normal.        Judgment: Judgment normal.    LABORATORY DATA:  I have reviewed the data as listed Lab Results  Component Value Date   WBC 8.0 08/09/2021   HGB 12.7 (L) 08/09/2021   HCT 36.9 (L) 08/09/2021   MCV 100.5 (H) 08/09/2021   PLT 203 08/09/2021   Recent Labs    06/14/21 0827 07/12/21 0823 08/09/21 0953  NA 135 136 133*  K 4.0 4.1 4.1  CL 102 101 100  CO2 _0 GLUCOSE 110* 146* 177*  BUN _1 CREATININE 1.30* 1.49* 1.42*  CALCIUM 9.0 8.8* 8.9  GFRNONAA 57* 49* 52*  PROT 6.8 6.4* 6.3*  ALBUMIN 3.8 3.9 4.2  AST _2 ALT _3 ALKPHOS 57 49 44  BILITOT 0.7 0.5 0.7    RADIOGRAPHIC STUDIES: I have personally reviewed the radiological images as listed and agreed with the findings in the  report. No results found.  ASSESSMENT & PLAN:   Multiple myeloma in remission ([place]) # Multiple myeloma: Status post autologous and cell transplant-s/p relapse currently on daratumumab- M spike fluctuates between 0.0 - 0.5 gm/dL in the past year. June 2022- M protein- NEG [IF; K/L 1.76]. ] PET scan on 08/14/2018 revealed no evidence of active myeloma. Bone survey on 11/10/2020 revealed no focal lytic lesions. There were stable compression fractions of T5, T10 and T12. SEP 2022- M protein-01.; K/l- 1.85- STABLE.    #  proceed with daratumumab today. Labs today reviewed;  acceptable for treatment today. SQ dex; dexamethasone 24 mg weekly on pre-meds [from home]  # PN/restless leg syndrome- [sec to thalilomid]- on baclofen 5-10 qhs. Recommend accupuncture    # Chronic diarrhea: [2-3 times a day] on immodium prn-  STABLE.   # ONJ- discontinued Zometa- STABLE.   # History of pulmonary embolism/ atrial fibrillation [Dr.Kowalski] on Eliquis- STABLE.   # CKD- stage III-  STABLE.   # Compression fractures sec to MM- s/p Pain pump [Dr.Naveira; 2012]; STABLE.    # Vaccination: s/p Flu shots [2022]; UNVACCINATED to [place]. Discussed re: EVUSHELD; will will research about it.    # DISPOSITION: wants 9:30 appts # Proceed with daratumumab today.  # follow up in 4 weeks- MD; labs [facility] with diff, CMP, Mg, MM panel;K/L ratio]- Dara IV- Dr.B  All questions were answered. The patient knows to call the clinic with any problems, questions or concerns.    Cammie Sickle, MD 08/09/2021 12:56 PM

## 2021-08-09 NOTE — Patient Instructions (Signed)
[facility]  Discharge Instructions: Thank you for choosing [facility] to provide your oncology and hematology care.  If you have a lab appointment with the [facility], please go directly to the [facility] and check in at the registration area.  Wear comfortable clothing and clothing appropriate for easy access to any Portacath or PICC line.   We strive to give you quality time with your provider. You may need to reschedule your appointment if you arrive late (15 or more minutes).  Arriving late affects you and other patients whose appointments are after yours.  Also, if you miss three or more appointments without notifying the office, you may be dismissed from the clinic at the provider's discretion.      For prescription refill requests, have your pharmacy contact our office and allow 72 hours for refills to be completed.    Today you received the following chemotherapy and/or immunotherapy agents - daratumumab      To help prevent nausea and vomiting after your treatment, we encourage you to take your nausea medication as directed.  BELOW ARE SYMPTOMS THAT SHOULD BE REPORTED IMMEDIATELY: *FEVER GREATER THAN 100.4 F (38 C) OR HIGHER *CHILLS OR SWEATING *NAUSEA AND VOMITING THAT IS NOT CONTROLLED WITH YOUR NAUSEA MEDICATION *UNUSUAL SHORTNESS OF BREATH *UNUSUAL BRUISING OR BLEEDING *URINARY PROBLEMS (pain or burning when urinating, or frequent urination) *BOWEL PROBLEMS (unusual diarrhea, constipation, pain near the anus) TENDERNESS IN MOUTH AND THROAT WITH OR WITHOUT PRESENCE OF ULCERS (sore throat, sores in mouth, or a toothache) UNUSUAL RASH, SWELLING OR PAIN  UNUSUAL VAGINAL DISCHARGE OR ITCHING   Items with * indicate a potential emergency and should be followed up as soon as possible or go to the Emergency Department if any problems should occur.  Please show the CHEMOTHERAPY ALERT CARD or IMMUNOTHERAPY ALERT CARD at  check-in to the Emergency Department and triage nurse.  Should you have questions after your visit or need to cancel or reschedule your appointment, please contact [facility]  [phone] and follow the prompts.  Office hours are 8:00 a.m. to 4:30 p.m. Monday - Friday. Please note that voicemails left after 4:00 p.m. may not be returned until the following business day.  We are closed weekends and major holidays. You have access to a nurse at all times for urgent questions. Please call the main number to the clinic [phone] and follow the prompts.  For any non-urgent questions, you may also contact your provider using MyChart. We now offer e-Visits for anyone 15 and older to request care online for non-urgent symptoms. For details visit [email or web address].   Also download the MyChart app! Go to the app store, search "MyChart", open the app, select [facility], and log in with your MyChart username and password.  Due to Covid, a mask is required upon entering the hospital/clinic. If you do not have a mask, one will be given to you upon arrival. For doctor visits, patients may have 1 support person aged 34 or older with them. For treatment visits, patients cannot have anyone with them due to current Covid guidelines and our immunocompromised population.   Daratumumab; Hyaluronidase Injection What is this medication? DARATUMUMAB; HYALURONIDASE (dar a toom ue mab / hye al ur ON i dase) is a monoclonal antibody. Hyaluronidase is used to improve the effects of daratumumab. It treats certain types of cancer. Some of the cancers treated are multiple myeloma and light-chain amyloidosis. This medicine may  be used for other purposes; ask your health care provider or pharmacist if you have questions. COMMON BRAND NAME(S): DARZALEX FASPRO What should I tell my care team before I take this medication? They need to know if you have any of these conditions: heart  disease infection especially a viral infection such as chickenpox, cold sores, herpes, or hepatitis B lung or breathing disease an unusual or allergic reaction to daratumumab, hyaluronidase, other medicines, foods, dyes, or preservatives pregnant or trying to get pregnant breast-feeding How should I use this medication? This medicine is for injection under the skin. It is given by a health care professional in a hospital or clinic setting. Talk to your pediatrician regarding the use of this medicine in children. Special care may be needed. Overdosage: If you think you have taken too much of this medicine contact a poison control center or emergency room at once. NOTE: This medicine is only for you. Do not share this medicine with others. What if I miss a dose? Keep appointments for follow-up doses as directed. It is important not to miss your dose. Call your doctor or health care professional if you are unable to keep an appointment. What may interact with this medication? Interactions have not been studied. This list may not describe all possible interactions. Give your health care provider a list of all the medicines, herbs, non-prescription drugs, or dietary supplements you use. Also tell them if you smoke, drink alcohol, or use illegal drugs. Some items may interact with your medicine. What should I watch for while using this medication? Your condition will be monitored carefully while you are receiving this medicine. This medicine can cause serious allergic reactions. To reduce your risk, your health care provider may give you other medicine to take before receiving this one. Be sure to follow the directions from your health care provider. This medicine can affect the results of blood tests to match your blood type. These changes can last for up to 6 months after the final dose. Your healthcare provider will do blood tests to match your blood type before you start treatment. Tell all of your  healthcare providers that you are being treated with this medicine before receiving a blood transfusion. This medicine can affect the results of some tests used to determine treatment response; extra tests may be needed to evaluate response. Do not become pregnant while taking this medicine or for 3 months after stopping it. Women should inform their health care provider if they wish to become pregnant or think they might be pregnant. There is a potential for serious side effects to an unborn child. Talk to your health care provider for more information. Do not breast-feed an infant while taking this medicine. What side effects may I notice from receiving this medication? Side effects that you should report to your doctor or health care professional as soon as possible: allergic reactions like skin rash, itching or hives, swelling of the face, lips, or tongue blurred vision breathing problems chest pain cough dizziness fast heartbeat feeling faint or lightheaded headache low blood counts - this medicine may decrease the number of white blood cells, red blood cells and platelets. You may be at increased risk for infections and bleeding. nausea, vomiting shortness of breath signs of decreased platelets or bleeding - bruising, pinpoint red spots on the skin, black, tarry stools, blood in the urine signs of decreased red blood cells - unusually weak or tired, feeling faint or lightheaded, falls signs of infection - fever  or chills, cough, sore throat, pain or difficulty passing urine Side effects that usually do not require medical attention (report these to your doctor or health care professional if they continue or are bothersome): back pain constipation diarrhea pain, tingling, numbness in the hands or feet muscle cramps swelling of the ankles, feet, hands tiredness trouble sleeping This list may not describe all possible side effects. Call your doctor for medical advice about side  effects. You may report side effects to FDA at 1-800-FDA-1088. Where should I keep my medication? This drug is given in a hospital or clinic and will not be stored at home. NOTE: This sheet is a summary. It may not cover all possible information. If you have questions about this medicine, talk to your doctor, pharmacist, or health care provider.  2022 [employer] Standard (2020-11-04 12:51:54)

## 2021-08-10 LAB — KAPPA/LAMBDA LIGHT CHAINS
Kappa free light chain: 7.1 mg/L (ref 3.3–19.4)
Kappa, lambda light chain ratio: 2.09 — ABNORMAL HIGH (ref 0.26–1.65)
Lambda free light chains: 3.4 mg/L — ABNORMAL LOW (ref 5.7–26.3)

## 2021-08-12 LAB — MULTIPLE MYELOMA PANEL, SERUM
Albumin SerPl Elph-Mcnc: 3.7 g/dL (ref 2.9–4.4)
Albumin/Glob SerPl: 1.7 (ref 0.7–1.7)
Alpha 1: 0.1 g/dL (ref 0.0–0.4)
Alpha2 Glob SerPl Elph-Mcnc: 0.9 g/dL (ref 0.4–1.0)
B-Globulin SerPl Elph-Mcnc: 0.9 g/dL (ref 0.7–1.3)
Gamma Glob SerPl Elph-Mcnc: 0.3 g/dL — ABNORMAL LOW (ref 0.4–1.8)
Globulin, Total: 2.2 g/dL (ref 2.2–3.9)
IgA: 17 mg/dL — ABNORMAL LOW (ref 61–437)
IgG (Immunoglobin G), Serum: 277 mg/dL — ABNORMAL LOW (ref 603–1613)
IgM (Immunoglobulin M), Srm: 36 mg/dL (ref 15–143)
Total Protein ELP: 5.9 g/dL — ABNORMAL LOW (ref 6.0–8.5)

## 2021-08-22 NOTE — Progress Notes (Signed)
PROVIDER NOTE: Information contained herein reflects review and annotations entered in association with encounter. Interpretation of such information and data should be left to medically-trained personnel. Information provided to patient can be located elsewhere in the medical record under "Patient Instructions". Document created using STT-dictation technology, any transcriptional errors that may result from process are unintentional.    Patient: Logan Perez  Service Category: Procedure Provider: Gaspar Cola, MD DOB: 05/07/1946 DOS: 08/23/2021 Location: [facility] MRN: [id number] Setting: Ambulatory - outpatient Referring Provider: No ref. provider found Type: Established Patient Specialty: Interventional Pain Management PCP: Sofie Hartigan, MD  Primary Reason for Visit: Interventional Pain Management Treatment. CC: Back Pain (lower)  Procedure:          Type: Management of Intrathecal Drug Delivery System (IDDS) - Reservoir Refill [phone]). No rate change.  Indications: 1. Chronic pain syndrome   2. 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 Cancer associated pain   4. Compression fracture of T12 vertebra (HCC) (70-75% magnitude) (with mild retropulsion)   5. Chronic low back pain (Bilateral) w/o sciatica   6. Presence of intrathecal pump   7. Presence of implanted infusion pump (Medtronic, programmable, intrathecal pump)   8. Encounter for adjustment or management of infusion pump   9. Pharmacologic therapy   10. Chronic use of opiate for therapeutic purpose   11. Encounter for therapeutic procedure    Pain Assessment: Self-Reported Pain Score: 2 /10             Reported level is compatible with observation.         Intrathecal Drug Delivery System (IDDS)  Pump Device:  Manufacturer: Medtronic Model: Synchromed II Model No.: [id number] Serial No.: [id number] H Delivery Route: Intrathecal Type: Programmable  Volume (mL): 40 mL reservoir Priming  Volume: n/a  Calibration Constant: 121.0  MRI compatibility: Conditional   Implant Details:  Date: 09/04/2017 Implanter: Olson Lucarelli A. Dossie Arbour, MD  Contact Information: [phone] Last Revision/Replacement: 09/04/2017 Estimated Replacement Date: Aug 2025  Implant Site: Abdominal Laterality: Right  Catheter: Manufacturer: Medtronic Model: InDura Model No.: 8709  Serial No.: n/a  Implanted Length (cm): 83.0  Catheter Volume (mL): 0.183  Tip Location (Level): n/a Canal Access Site: n/a  Drug content:  Primary Medication Class: Opioid  Medication: PF-Fentanyl  Concentration: 1500.0 mcg/mL   Secondary Medication Class: Local Anesthetic  Medication: PF-Bupivacaine  Concentration: 30.0 mg/mL   Tertiary Medication Class: Non-opioid analgesic  Medication: PF-Clonidine  Concentration: 300.0 mcg/mL   Fourth Medication Class: none   PA parameters (PCA-mode):  Mode: Off (Inactive)  Programming:  Type: Simple continuous.  Medication, Concentration, Infusion Program, & Delivery Rate: For up-to-date details please see most recent scanned programming printout.    Changes:  Medication Change: None at this point Rate Change: No change in rate  Reported side-effects or adverse reactions: None reported  Effectiveness: Described as relatively effective, allowing for increase in activities of daily living (ADL) Clinically meaningful improvement in function (CMIF): Sustained CMIF goals met  Plan: Pump refill today   Pharmacotherapy Assessment   Analgesic: Intrathecal PF-Fentanyl 502.2 mcg/day (20.9 mcg/hr) MME/day: 50.16 mg/day.   Monitoring: [place] PMP: PDMP reviewed during this encounter.       Pharmacotherapy: No side-effects or adverse reactions reported. Compliance: No problems identified. Effectiveness: Clinically acceptable. Plan: Refer to "POC". UDS: No results found for: SUMMARY   Pre-op H&P Assessment:  Mr. Casebolt is a 75 y.o. (year old), male patient, seen  today for interventional treatment. He  has  a past surgical history that includes Knee arthroscopy (Left, 1992); Pain pump implantation (2012); stem cell implant (2008); Cataract extraction w/PHACO (Right, 05/02/2016); Cataract extraction w/PHACO (Left, 05/16/2016); Limbal stem cell transplant; PORTA CATH INSERTION (N/A, 12/04/2016); Colonoscopy with propofol (N/A, 03/22/2017); Esophagogastroduodenoscopy (egd) with propofol (03/22/2017); Eye surgery; Back surgery (1994); and Pain pump implantation (N/A, 09/04/2017). Mr. Mciver has a current medication list which includes the following prescription(s): acetaminophen, baclofen, bismuth subsalicylate, calcium citrate-vitamin d, cetirizine, vitamin d3, daratumumab, dexamethasone, diltiazem, diltiazem, diphenhydramine, eliquis, fluticasone, carboxymethylcellulose sodium, loperamide, loratadine, lovastatin, montelukast, multiple vitamins-iron, naloxone, NON FORMULARY, omeprazole, ondansetron, PAIN MANAGEMENT INTRATHECAL, IT, PUMP, tamsulosin, valacyclovir, vitamin b-12, and [DISCONTINUED] PAIN MANAGEMENT IT PUMP REFILL. His primarily concern today is the Back Pain (lower)  Initial Vital Signs:  Pulse/HCG Rate: 91  Temp: (!) 96.8 F (36 C) Resp: 16 BP: 135/80 SpO2: 98 %  BMI: Estimated body mass index is 25.84 kg/m as calculated from the following:   Height as of this encounter: '5\' 7"'  (1.702 m).   Weight as of this encounter: 165 lb (74.8 kg).  Risk Assessment: Allergies: Reviewed. He is allergic to ciprofloxacin, azithromycin, bee pollen, pollen extract, zoledronic acid, and rivaroxaban.  Allergy Precautions: None required Coagulopathies: Reviewed. None identified.  Blood-thinner therapy: None at this time Active Infection(s): Reviewed. None identified. Mr. Sipos is afebrile  Site Confirmation: Mr. Zaremba was asked to confirm the procedure and laterality before marking the site Procedure checklist: Completed Consent: Before the procedure and under  the influence of no sedative(s), amnesic(s), or anxiolytics, the patient was informed of the treatment options, risks and possible complications. To fulfill our ethical and legal obligations, as recommended by the American Medical Association's Code of Ethics, I have informed the patient of my clinical impression; the nature and purpose of the treatment or procedure; the risks, benefits, and possible complications of the intervention; the alternatives, including doing nothing; the risk(s) and benefit(s) of the alternative treatment(s) or procedure(s); and the risk(s) and benefit(s) of doing nothing.  Mr. Tagliaferro was provided with information about the general risks and possible complications associated with most interventional procedures. These include, but are not limited to: failure to achieve desired goals, infection, bleeding, organ or nerve damage, allergic reactions, paralysis, and/or death.  In addition, he was informed of those risks and possible complications associated to this particular procedure, which include, but are not limited to: damage to the implant; failure to decrease pain; local, systemic, or serious CNS infections, intraspinal abscess with possible cord compression and paralysis, or life-threatening such as meningitis; bleeding; organ damage; nerve injury or damage with subsequent sensory, motor, and/or autonomic system dysfunction, resulting in transient or permanent pain, numbness, and/or weakness of one or several areas of the body; allergic reactions, either minor or major life-threatening, such as anaphylactic or anaphylactoid reactions.  Furthermore, Mr. Eckardt was informed of those risks and complications associated with the medications. These include, but are not limited to: allergic reactions (i.e.: anaphylactic or anaphylactoid reactions); endorphine suppression; bradycardia and/or hypotension; water retention and/or peripheral vascular relaxation leading to lower extremity  edema and possible stasis ulcers; respiratory depression and/or shortness of breath; decreased metabolic rate leading to weight gain; swelling or edema; medication-induced neural toxicity; particulate matter embolism and blood vessel occlusion with resultant organ, and/or nervous system infarction; and/or intrathecal granuloma formation with possible spinal cord compression and permanent paralysis.  Before refilling the pump Mr. Hayner was informed that some of the medications used in the devise may not be FDA approved for such use  and therefore it constitutes an off-label use of the medications.  Finally, he was informed that Medicine is not an exact science; therefore, there is also the possibility of unforeseen or unpredictable risks and/or possible complications that may result in a catastrophic outcome. The patient indicated having understood very clearly. We have given the patient no guarantees and we have made no promises. Enough time was given to the patient to ask questions, all of which were answered to the patient's satisfaction. Mr. Hutmacher has indicated that he wanted to continue with the procedure. Attestation: I, the ordering provider, attest that I have discussed with the patient the benefits, risks, side-effects, alternatives, likelihood of achieving goals, and potential problems during recovery for the procedure that I have provided informed consent. Date  Time: 08/23/2021  1:15 PM  Pre-Procedure Preparation:  Monitoring: As per clinic protocol. Respiration, ETCO2, SpO2, BP, heart rate and rhythm monitor placed and checked for adequate function Safety Precautions: Patient was assessed for positional comfort and pressure points before starting the procedure. Time-out: I initiated and conducted the "Time-out" before starting the procedure, as per protocol. The patient was asked to participate by confirming the accuracy of the "Time Out" information. Verification of the correct person,  site, and procedure were performed and confirmed by me, the nursing staff, and the patient. "Time-out" conducted as per Joint Commission's Universal Protocol (UP.01.01.01). Time: 1325  Description of Procedure:          Position: Supine Target Area: Central-port of intrathecal pump. Approach: Anterior, 90 degree angle approach. Area Prepped: Entire Area around the pump implant. DuraPrep (Iodine Povacrylex [0.7% available iodine] and Isopropyl Alcohol, 74% w/w) Safety Precautions: Aspiration looking for blood return was conducted prior to all injections. At no point did we inject any substances, as a needle was being advanced. No attempts were made at seeking any paresthesias. Safe injection practices and needle disposal techniques used. Medications properly checked for expiration dates. SDV (single dose vial) medications used. Description of the Procedure: Protocol guidelines were followed. Two nurses trained to do implant refills were present during the entire procedure. The refill medication was checked by both healthcare providers as well as the patient. The patient was included in the "Time-out" to verify the medication. The patient was placed in position. The pump was identified. The area was prepped in the usual manner. The sterile template was positioned over the pump, making sure the side-port location matched that of the pump. Both, the pump and the template were held for stability. The needle provided in the Medtronic Kit was then introduced thru the center of the template and into the central port. The pump content was aspirated and discarded volume documented. The new medication was slowly infused into the pump, thru the filter, making sure to avoid overpressure of the device. The needle was then removed and the area cleansed, making sure to leave some of the prepping solution back to take advantage of its long term bactericidal properties.   Note: Today I had to assist the nursing staff with  the pump when they found him cells having to apply significant pressure to inject medication.  I took over the procedure and restarted everything by removing the old needle and replacing it with a new one.  I was able to access the central port on the first attempt and when I aspirated syringe I was able to actually get some additional volume and out which explains the reason why there were having some difficulty injecting.  Once this  was done and I confirmed that the pump was truly empty, then the procedure was resumed as per the usual manner.    The pump was interrogated and programmed to reflect the correct medication, volume, and dosage. The program was printed and taken to the physician for approval. Once checked and signed by the physician, a copy was provided to the patient and another scanned into the EMR.  Vitals:   08/23/21 1314  BP: 135/80  Pulse: 91  Resp: 16  Temp: (!) 96.8 F (36 C)  TempSrc: Temporal  SpO2: 98%  Weight: 165 lb (74.8 kg)  Height: '5\' 7"'  (1.702 m)    Start Time: 1325 hrs. End Time: 1345 hrs. Materials & Medications: Medtronic Refill Kit Medication(s): Please see chart orders for details.  Imaging Guidance:          Type of Imaging Technique: None used Indication(s): N/A Exposure Time: No patient exposure Contrast: None used. Fluoroscopic Guidance: N/A Ultrasound Guidance: N/A Interpretation: N/A  Antibiotic Prophylaxis:   Anti-infectives (From admission, onward)    None      Indication(s): None identified  Post-operative Assessment:  Post-procedure Vital Signs:  Pulse/HCG Rate: 91  Temp: (!) 96.8 F (36 C) Resp: 16 BP: 135/80 SpO2: 98 %  EBL: None  Complications: No immediate post-treatment complications observed by team, or reported by patient.  Note: The patient tolerated the entire procedure well. A repeat set of vitals were taken after the procedure and the patient was kept under observation following institutional policy, for this  type of procedure. Post-procedural neurological assessment was performed, showing return to baseline, prior to discharge. The patient was provided with post-procedure discharge instructions, including a section on how to identify potential problems. Should any problems arise concerning this procedure, the patient was given instructions to immediately contact us, at any time, without hesitation. In any case, we plan to contact the patient by telephone for a follow-up status report regarding this interventional procedure.  Comments:  No additional relevant information.  Plan of Care  Orders:  Orders Placed This Encounter  Procedures   PUMP REFILL    Maintain Protocol by having two(2) healthcare providers during procedure and programming.    Scheduling Instructions:     Please refill intrathecal pump today.    Order Specific Question:   Where will this procedure be performed?    Answer:   [facility]   PUMP REFILL    Whenever possible schedule on a procedure today.    Standing Status:   Future    Standing Expiration Date:   12/20/2021    Scheduling Instructions:     Please schedule intrathecal pump refill based on pump programming. Avoid schedule intervals of more than 120 days (4 months).    Order Specific Question:   Where will this procedure be performed?    Answer:   [facility]   Informed Consent Details: Physician/Practitioner Attestation; Transcribe to consent form and obtain patient signature    Transcribe to consent form and obtain patient signature.    Order Specific Question:   Physician/Practitioner attestation of informed consent for procedure/surgical case    Answer:   I, the physician/practitioner, attest that I have discussed with the patient the benefits, risks, side effects, alternatives, likelihood of achieving goals and potential problems during recovery for the procedure that I have provided informed consent.    Order Specific Question:   Procedure     Answer:   Intrathecal pump refill    Order Specific  Question:   Physician/Practitioner performing the procedure    Answer:   Attending Physician: Kathlen Brunswick. Dossie Arbour, MD & designated trained staff    Order Specific Question:   Indication/Reason    Answer:   Chronic Pain Syndrome (G89.4), presence of an intrathecal pump (Z97.8)    Chronic Opioid Analgesic:  Intrathecal PF-Fentanyl 502.2 mcg/day (20.9 mcg/hr) MME/day: 50.16 mg/day.   Medications ordered for procedure: Meds ordered this encounter  Medications   naloxone (NARCAN) nasal spray 4 mg/0.1 mL    Sig: Place 1 spray into the nose as needed for up to 365 doses (for opioid-induced respiratory depresssion). In case of emergency (overdose), spray once into each nostril. If no response within 3 minutes, repeat application and call [phone].    Dispense:  1 each    Refill:  0    Instruct patient in proper use of device.    Medications administered: Lexine Baton "Rick" had no medications administered during this visit.  See the medical record for exact dosing, route, and time of administration.  Follow-up plan:   Return for Pump Refill (Max:17mo.     Interventional management options:  Considering:   Palliative intrathecal pump refills  No other procedures currently under consideration   Palliative PRN treatment(s):   Palliative management of intrathecal pump       Recent Visits No visits were found meeting these conditions. Showing recent visits within past 90 days and meeting all other requirements Today's Visits Date Type Provider Dept  08/23/21 Procedure visit NMilinda Pointer MD [facility]  Showing today's visits and meeting all other requirements Future Appointments No visits were found meeting these conditions. Showing future appointments within next 90 days and meeting all other requirements Disposition: Discharge home  Discharge (Date  Time): 08/23/2021; 1400 hrs.   Primary Care Physician:  FSofie Hartigan MD [facility] Note by: FGaspar Cola MD Date: 08/23/2021; Time: 2:22 PM  Disclaimer:  Medicine is not an eChief Strategy Officer The only guarantee in medicine is that nothing is guaranteed. It is important to note that the decision to proceed with this intervention was based on the information collected from the patient. The Data and conclusions were drawn from the patient's questionnaire, the interview, and the physical examination. Because the information was provided in large part by the patient, it cannot be guaranteed that it has not been purposely or unconsciously manipulated. Every effort has been made to obtain as much relevant data as possible for this evaluation. It is important to note that the conclusions that lead to this procedure are derived in large part from the available data. Always take into account that the treatment will also be dependent on availability of resources and existing treatment guidelines, considered by other Pain Management Practitioners as being common knowledge and practice, at the time of the intervention. For Medico-Legal purposes, it is also important to point out that variation in procedural techniques and pharmacological choices are the acceptable norm. The indications, contraindications, technique, and results of the above procedure should only be interpreted and judged by a Board-Certified Interventional Pain Specialist with extensive familiarity and expertise in the same exact procedure and technique.

## 2021-08-23 ENCOUNTER — Other Ambulatory Visit: Payer: Self-pay

## 2021-08-23 ENCOUNTER — Ambulatory Visit: Payer: Medicare Other | Attending: Pain Medicine | Admitting: Pain Medicine

## 2021-08-23 ENCOUNTER — Encounter: Payer: Self-pay | Admitting: Pain Medicine

## 2021-08-23 VITALS — BP 135/80 | HR 91 | Temp 96.8°F | Resp 16 | Ht 67.0 in | Wt 165.0 lb

## 2021-08-23 DIAGNOSIS — G8929 Other chronic pain: Secondary | ICD-10-CM | POA: Insufficient documentation

## 2021-08-23 DIAGNOSIS — Z978 Presence of other specified devices: Secondary | ICD-10-CM | POA: Diagnosis present

## 2021-08-23 DIAGNOSIS — Z79899 Other long term (current) drug therapy: Secondary | ICD-10-CM

## 2021-08-23 DIAGNOSIS — G894 Chronic pain syndrome: Secondary | ICD-10-CM | POA: Diagnosis present

## 2021-08-23 DIAGNOSIS — G893 Neoplasm related pain (acute) (chronic): Secondary | ICD-10-CM | POA: Diagnosis present

## 2021-08-23 DIAGNOSIS — S22080S Wedge compression fracture of T11-T12 vertebra, sequela: Secondary | ICD-10-CM | POA: Diagnosis present

## 2021-08-23 DIAGNOSIS — Z451 Encounter for adjustment and management of infusion pump: Secondary | ICD-10-CM | POA: Diagnosis present

## 2021-08-23 DIAGNOSIS — M545 Low back pain, unspecified: Secondary | ICD-10-CM

## 2021-08-23 DIAGNOSIS — Z5189 Encounter for other specified aftercare: Secondary | ICD-10-CM | POA: Diagnosis present

## 2021-08-23 DIAGNOSIS — Z79891 Long term (current) use of opiate analgesic: Secondary | ICD-10-CM

## 2021-08-23 DIAGNOSIS — C9001 Multiple myeloma in remission: Secondary | ICD-10-CM

## 2021-08-23 DIAGNOSIS — Z95828 Presence of other vascular implants and grafts: Secondary | ICD-10-CM | POA: Diagnosis present

## 2021-08-23 MED ORDER — NALOXONE HCL 4 MG/0.1ML NA LIQD
1.0000 | NASAL | 0 refills | Status: AC | PRN
Start: 2021-08-23 — End: 2022-08-23

## 2021-08-23 NOTE — Patient Instructions (Signed)
Opioid Overdose Opioids are drugs that are often used to treat pain. Opioids include illegal drugs, such as heroin, as well as prescription pain medicines, such as codeine, morphine, hydrocodone, and fentanyl. An opioid overdose happens when you take too much of an opioid. An overdose may be intentional or accidental and can happen with any type of opioid. The effects of an overdose can be mild, dangerous, or even deadly. Opioid overdose is a medical emergency. What are the causes? This condition may be caused by: Taking too much of an opioid on purpose. Taking too much of an opioid by accident. Using two or more substances that contain opioids at the same time. Taking an opioid with a substance that affects your heart, breathing, or blood pressure. These include alcohol, tranquilizers, sleeping pills, illegal drugs, and some over-the-counter medicines. This condition may also happen due to an error made by: A health care provider who prescribes a medicine. The pharmacist who fills the prescription. What increases the risk? This condition is more likely in: Children. They may be attracted to colorful pills. Because of a child's small size, even a small amount of a medicine can be dangerous. Older people. They may be taking many different medicines. Older people may have difficulty reading labels or remembering when they last took their medicines. They may also be more sensitive to the effects of opioids. People with chronic medical conditions, especially heart, liver, kidney, or neurological diseases. People who take an opioid for a long period of time. People who take opioids and use illegal drugs, such as heroin, or other substances, such as alcohol. People who: Have a history of drug or alcohol abuse. Have certain mental health conditions. Have a history of previous drug overdoses. People who take opioids that are not prescribed for them. What are the signs or symptoms? Symptoms of this  condition depend on the type of opioid and the amount that was taken. Common symptoms include: Sleepiness or difficulty waking from sleep. Confusion. Slurred speech. Slowed breathing and a slow pulse (bradycardia). Nausea and vomiting. Abnormally small pupils. Signs and symptoms that require emergency treatment include: Cold, clammy, and pale skin. Blue lips and fingernails. Vomiting. Gurgling sounds in the throat. A pulse that is very slow or difficult to detect. Breathing that is very irregular, slow, noisy, or difficult to detect. Inability to respond to speech or be awakened from sleep (stupor). Seizures. How is this diagnosed? This condition is diagnosed based on your symptoms and medical history. It is important to tell your health care provider: About all of the opioids that you took. When you took the opioids. Whether you were drinking alcohol or using marijuana, cocaine, or other drugs. Your health care provider will do a physical exam. This exam may include: Checking and monitoring your heart rate and rhythm, breathing rate, temperature, and blood pressure. Measuring oxygen levels in your blood. Checking for abnormally small pupils. You may also have blood tests or urine tests. You may have X-rays if you are having severe breathing problems. How is this treated? This condition requires immediate medical treatment and hospitalization. Reversing the effects of the opioid is the first step in treatment. If you have a Narcan kit or naloxone, use it right away. Follow your health care provider's instructions. A friend or family member can also help you with this. The rest of your treatment will be given in the hospital intensive care (ICU). Treatment in the hospital may include: Giving salts and minerals (electrolytes) along with fluids through   an IV. ?Inserting a breathing tube (endotracheal tube) in your airway to help you breathe if you cannot breathe on your own or you are in  danger of not being able to breathe on your own. ?Giving oxygen through a small tube under your nose. ?Passing a tube through your nose and into your stomach (nasogastric tube, or NG tube) to empty your stomach. ?Giving medicines that: ?Increase your blood pressure. ?Relieve nausea and vomiting. ?Relieve abdominal pain and cramping. ?Reverse the effects of the opioid (naloxone). ?Monitoring your heart and oxygen levels. ?Ongoing counseling and mental health support if you intentionally overdosed or used an illegal drug. ?Follow these instructions at home: ?Medicines ?Take over-the-counter and prescription medicines only as told by your health care provider. ?Always ask your health care provider about possible side effects and interactions of any new medicine that you start taking. ?Keep a list of all the medicines that you take, including over-the-counter medicines. Bring this list with you to all your medical visits. ?General instructions ?Drink enough fluid to keep your urine pale yellow. ?Keep all follow-up visits. This is important. ?How is this prevented? ?Read the drug inserts that come with your opioid pain medicines. ?Take medicines only as told by your health care provider. Do not take more medicine than you are told. Do not take medicines more frequently than you are told. ?Do not drink alcohol or take sedatives when taking opioids. ?Do not use illegal or recreational drugs, including cocaine, ecstasy, and marijuana. ?Do not take opioid medicines that are not prescribed for you. ?Store all medicines in safety containers that are out of the reach of children. ?Get help if you are struggling with: ?Alcohol or drug use. ?Depression or another mental health problem. ?Thoughts of hurting yourself or another person. ?Keep the phone number of your local poison control center near your phone or in your mobile phone. In the [place] the hotline of the National Poison Control Center is (800) 222-1222. ?If you were  prescribed naloxone, make sure you understand how to take it. ?Contact a health care provider if: ?You need help understanding how to take your pain medicines. ?You feel your medicines are too strong. ?You are concerned that your pain medicines are not working well for your pain. ?You develop new symptoms or side effects when you are taking medicines. ?Get help right away if: ?You or someone else is having symptoms of an opioid overdose. Get help even if you are not sure. ?You have thoughts about hurting yourself or others. ?You have: ?Chest pain. ?Difficulty breathing. ?A loss of consciousness. ?These symptoms may represent a serious problem that is an emergency. Do not wait to see if the symptoms will go away. Get medical help right away. Call your local emergency services (911 in the [place] Do not drive yourself to the hospital. ?If you ever feel like you may hurt yourself or others, or have thoughts about taking your own life, get help right away. You can go to your nearest emergency department or: ?Call your local emergency services (911 in the [place] ?Call a suicide crisis helpline, such as the National Suicide Prevention Lifeline at 1-800-273-8255 or 988 in the [place] This is open 24 hours a day in the [place] ?Text the Crisis Text Line at 741741 (in the [place] ?Summary ?Opioids are drugs that are often used to treat pain. Opioids include illegal drugs, such as heroin, as well as prescription pain medicines. ?An opioid overdose happens when you take too much of an opioid. ?  Overdoses can be intentional or accidental. ?Opioid overdose is very dangerous. It is a life-threatening emergency. ?If you or someone you know is experiencing an opioid overdose, get help right away. ?This information is not intended to replace advice given to you by your health care provider. Make sure you discuss any questions you have with your health care provider. ?Document Revised: 04/20/2021 Document Reviewed: 01/05/2021 ?Elsevier  Patient Education ? 2022 [employer] ? ?

## 2021-08-23 NOTE — Progress Notes (Signed)
Safety precautions to be maintained throughout the outpatient stay will include: orient to surroundings, keep bed in low position, maintain call bell within reach at all times, provide assistance with transfer out of bed and ambulation.  

## 2021-08-24 ENCOUNTER — Telehealth: Payer: Self-pay | Admitting: *Deleted

## 2021-08-24 NOTE — Telephone Encounter (Signed)
No problems post pump refill.

## 2021-08-29 ENCOUNTER — Encounter: Payer: Self-pay | Admitting: Gastroenterology

## 2021-08-29 ENCOUNTER — Other Ambulatory Visit: Payer: Self-pay

## 2021-08-29 ENCOUNTER — Ambulatory Visit (INDEPENDENT_AMBULATORY_CARE_PROVIDER_SITE_OTHER): Payer: Medicare Other | Admitting: Gastroenterology

## 2021-08-29 VITALS — BP 138/77 | HR 99 | Temp 97.9°F | Ht 67.0 in | Wt 168.6 lb

## 2021-08-29 DIAGNOSIS — K529 Noninfective gastroenteritis and colitis, unspecified: Secondary | ICD-10-CM | POA: Diagnosis not present

## 2021-08-29 DIAGNOSIS — K219 Gastro-esophageal reflux disease without esophagitis: Secondary | ICD-10-CM

## 2021-08-29 NOTE — Progress Notes (Signed)
Primary Care Physician: Sofie Hartigan, MD  Primary Gastroenterologist:  Dr. Lucilla Lame  Chief Complaint  Patient presents with   Gastroesophageal Reflux    HPI: Logan Perez is a 75 y.o. male here after seeing me for diarrhea in the past.  The patient was last seen by me in 2021 and had a colonoscopy in 2018. At that time with biopsies showing some acute inflammation without any signs of chronicity.  The patient thought he was due for a colonoscopy with his last one in 2018.  The patient reports that he has had chronic diarrhea despite being able to control it with Imodium.  He reports that he will take Imodium and then will have very few bowel movements then started having a soft stool followed by diarrhea and then the process repeats itself.  He says benefits states this way he is not all that by.  His main reason for coming today is that he says that he is on the impression he has not had a colonoscopy in 9 years.  The patient was told that he had a colonoscopy in 2018 and the one prior to that in 2013 had an adenomatous polyp.  Past Medical History:  Diagnosis Date   Anxiety    Atrial fibrillation (HCC)    BPH (benign prostatic hyperplasia)    Complication of anesthesia    BAD HEADACHE NIGHT OF FIRST CATARACT   Compression fracture of lumbar vertebra (HCC)    Difficulty voiding    Dysrhythmia    A FIB   Elevated PSA    GERD (gastroesophageal reflux disease)    Hearing aid worn    bilateral   History of kidney stones    HLD (hyperlipidemia)    HOH (hard of hearing)    Hypertension    Multiple myeloma (HCC)    Neuropathy    feet. 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   Pain    BACK   Palpitations    Pneumonia    Pulmonary embolism (HCC)    Sepsis (HCC)    Stroke (HCC)    TIA, detected on CT scan. pt was unaware    Current Outpatient Medications  Medication Sig Dispense Refill   acetaminophen (TYLENOL) 500 MG tablet Take 500 mg by mouth 2 (two) times a day.      baclofen (LIORESAL) 10 MG tablet Take 1 tablet by mouth 2 (two) times daily.     bismuth subsalicylate (PEPTO BISMOL) 262 MG/15ML suspension Take 30 mLs by mouth every 6 (six) hours as needed for diarrhea or loose stools.     calcium citrate-vitamin D (CITRACAL+D) 315-200 MG-UNIT tablet Take 1 tablet by mouth 2 (two) times daily.     cetirizine (ZYRTEC) 10 MG tablet Take 10 mg by mouth daily as needed for allergies.     Cholecalciferol (VITAMIN D3) 2000 units capsule Take 2,000 Units daily by mouth.      Daratumumab (DARZALEX IV) Inject 1,200 mg every 30 (thirty) days into the vein.      dexamethasone (DECADRON) 4 MG tablet TAKE 4 TABLETS BY MOUTH 1 HOUR PRIOR TO INFUSION, THEN 1 TABLET ON DAYS 2 AND 3 AS DIRECTED 30 tablet 0   diltiazem (CARDIZEM CD) 120 MG 24 hr capsule Take 120 mg daily by mouth.      diltiazem (CARDIZEM) 60 MG tablet Take 60 mg by mouth daily as needed (for increased heart rate > 140).     diphenhydrAMINE (BENADRYL) 25 MG tablet Take 25 mg  by mouth as directed. With chemo treatment     ELIQUIS 5 MG TABS tablet TAKE 1 TABLET TWICE A DAY 180 tablet 1   fluticasone (FLONASE) 50 MCG/ACT nasal spray Place 1 spray into the nose daily as needed for allergies.     Hypromellose (ARTIFICIAL TEARS OP) Place 1 drop into both eyes as needed (for dry eyes).     loperamide (IMODIUM) 2 MG capsule Take 4 mg by mouth as needed for diarrhea or loose stools.     loratadine (CLARITIN) 10 MG tablet Take 10 mg by mouth daily as needed.     lovastatin (MEVACOR) 20 MG tablet Take 20 mg by mouth every evening.      montelukast (SINGULAIR) 10 MG tablet Take 1 tablet by mouth on the day before, day of, and 2 days after treatment. 30 tablet 1   Multiple Vitamins-Iron (MULTIVITAMIN/IRON PO) Take 1 tablet by mouth daily.      naloxone (NARCAN) nasal spray 4 mg/0.1 mL Place 1 spray into the nose as needed for up to 365 doses (for opioid-induced respiratory depresssion). In case of emergency (overdose), spray  once into each nostril. If no response within 3 minutes, repeat application and call 808. 1 each 0   NON FORMULARY IT pump  Fentanyl 1,500.0 mcg/ml Bupivicaine 30.0 mg/ml Clonidine 300.0 mcg/ml Rate 500.4 mg/day     omeprazole (PRILOSEC) 20 MG capsule Take 20 mg by mouth daily.      ondansetron (ZOFRAN) 8 MG tablet Take 1 tablet (8 mg total) by mouth as directed. Take with chemo and can take it as needed 20 tablet 1   PAIN MANAGEMENT INTRATHECAL, IT, PUMP 1 each by Intrathecal route. Intrathecal (IT) medication:  Fentanyl 1500 mcg/ml, bupivicaine 30.0 mg/ml, clonidine 300.0 mcg/ml Daily dose fent,.500.21mg/day, bupivacaine 10.0074mday, clonidine 1000.07 mcg/day     tamsulosin (FLOMAX) 0.4 MG CAPS capsule Take 1 capsule (0.4 mg total) by mouth daily. 90 capsule 3   valACYclovir (VALTREX) 500 MG tablet TAKE 1 TABLET DAILY 90 tablet 3   vitamin B-12 (CYANOCOBALAMIN) 1000 MCG tablet Take 1,000 mcg daily by mouth.      No current facility-administered medications for this visit.    Allergies as of 08/29/2021 - Review Complete 08/29/2021  Allergen Reaction Noted   Ciprofloxacin  02/08/2021   Azithromycin Diarrhea and Other (See Comments) 11/10/2015   Bee pollen Other (See Comments) 02/05/2017   Pollen extract Other (See Comments) 02/05/2017   Zoledronic acid Other (See Comments) 04/26/2015   Rivaroxaban Rash 04/26/2015    ROS:  General: Negative for anorexia, weight loss, fever, chills, fatigue, weakness. ENT: Negative for hoarseness, difficulty swallowing , nasal congestion. CV: Negative for chest pain, angina, palpitations, dyspnea on exertion, peripheral edema.  Respiratory: Negative for dyspnea at rest, dyspnea on exertion, cough, sputum, wheezing.  GI: See history of present illness. GU:  Negative for dysuria, hematuria, urinary incontinence, urinary frequency, nocturnal urination.  Endo: Negative for unusual weight change.    Physical Examination:   BP 138/77 (BP Location:  Left Arm, Patient Position: Sitting, Cuff Size: Large)   Pulse 99   Temp 97.9 F (36.6 C) (Temporal)   Ht _0  (1.702 m)   Wt 168 lb 9.6 oz (76.5 kg)   BMI 26.41 kg/m   General: Well-nourished, well-developed in no acute distress.  Eyes: No icterus. Conjunctivae pink. Neuro: Alert and oriented x 3.  Grossly intact. Skin: no jaundice.   Psych: Alert and cooperative, normal mood and affect.  Labs:  Imaging Studies: No results found.  Assessment and Plan:   Logan Perez is a 75 y.o. y/o male who comes in today with a history of an adenomatous polyp and was reported to come here for reflux but he states that his reflux is well controlled with omeprazole. He was under the impression that he was due for repeat colonoscopy but he has been informed that his colonoscopy is not due to next year.  The patient will follow-up with a repeat colonoscopy in 2023.  The patient has been explained the plan and agrees with it.     Lucilla Lame, MD. [facility]    Note: This dictation was prepared with Dragon dictation along with smaller phrase technology. Any transcriptional errors that result from this process are unintentional.

## 2021-09-07 ENCOUNTER — Inpatient Hospital Stay (HOSPITAL_BASED_OUTPATIENT_CLINIC_OR_DEPARTMENT_OTHER): Payer: Medicare Other | Admitting: Internal Medicine

## 2021-09-07 ENCOUNTER — Other Ambulatory Visit: Payer: Self-pay

## 2021-09-07 ENCOUNTER — Inpatient Hospital Stay: Payer: Medicare Other

## 2021-09-07 ENCOUNTER — Other Ambulatory Visit: Payer: Self-pay | Admitting: *Deleted

## 2021-09-07 ENCOUNTER — Encounter: Payer: Self-pay | Admitting: Internal Medicine

## 2021-09-07 VITALS — BP 128/73 | HR 69 | Temp 96.8°F | Resp 16

## 2021-09-07 DIAGNOSIS — C9001 Multiple myeloma in remission: Secondary | ICD-10-CM

## 2021-09-07 DIAGNOSIS — C9002 Multiple myeloma in relapse: Secondary | ICD-10-CM

## 2021-09-07 DIAGNOSIS — Z5112 Encounter for antineoplastic immunotherapy: Secondary | ICD-10-CM | POA: Diagnosis not present

## 2021-09-07 LAB — COMPREHENSIVE METABOLIC PANEL
ALT: 24 U/L (ref 0–44)
AST: 23 U/L (ref 15–41)
Albumin: 3.9 g/dL (ref 3.5–5.0)
Alkaline Phosphatase: 52 U/L (ref 38–126)
Anion gap: 5 (ref 5–15)
BUN: 15 mg/dL (ref 8–23)
CO2: 22 mmol/L (ref 22–32)
Calcium: 8.6 mg/dL — ABNORMAL LOW (ref 8.9–10.3)
Chloride: 108 mmol/L (ref 98–111)
Creatinine, Ser: 1.32 mg/dL — ABNORMAL HIGH (ref 0.61–1.24)
GFR, Estimated: 56 mL/min — ABNORMAL LOW (ref >60)
Glucose, Bld: 102 mg/dL — ABNORMAL HIGH (ref 70–99)
Potassium: 3.8 mmol/L (ref 3.5–5.1)
Sodium: 135 mmol/L (ref 135–145)
Total Bilirubin: 0.5 mg/dL (ref 0.3–1.2)
Total Protein: 6.4 g/dL — ABNORMAL LOW (ref 6.5–8.1)

## 2021-09-07 LAB — CBC WITH DIFFERENTIAL/PLATELET
Abs Immature Granulocytes: 0.02 10*3/uL (ref 0.00–0.07)
Basophils Absolute: 0 10*3/uL (ref 0.0–0.1)
Basophils Relative: 0 %
Eosinophils Absolute: 0.2 10*3/uL (ref 0.0–0.5)
Eosinophils Relative: 2 %
HCT: 38 % — ABNORMAL LOW (ref 39.0–52.0)
Hemoglobin: 13.2 g/dL (ref 13.0–17.0)
Immature Granulocytes: 0 %
Lymphocytes Relative: 16 %
Lymphs Abs: 1.3 10*3/uL (ref 0.7–4.0)
MCH: 34.3 pg — ABNORMAL HIGH (ref 26.0–34.0)
MCHC: 34.7 g/dL (ref 30.0–36.0)
MCV: 98.7 fL (ref 80.0–100.0)
Monocytes Absolute: 0.4 10*3/uL (ref 0.1–1.0)
Monocytes Relative: 5 %
Neutro Abs: 6 10*3/uL (ref 1.7–7.7)
Neutrophils Relative %: 77 %
Platelets: 193 10*3/uL (ref 150–400)
RBC: 3.85 MIL/uL — ABNORMAL LOW (ref 4.22–5.81)
RDW: 13.4 % (ref 11.5–15.5)
WBC: 7.9 10*3/uL (ref 4.0–10.5)
nRBC: 0 % (ref 0.0–0.2)

## 2021-09-07 LAB — MAGNESIUM: Magnesium: 1.8 mg/dL (ref 1.7–2.4)

## 2021-09-07 MED ORDER — SODIUM CHLORIDE 0.9 % IV SOLN
16.0000 mg/kg | Freq: Once | INTRAVENOUS | Status: AC
  Administered 2021-09-07: 1200 mg via INTRAVENOUS
  Filled 2021-09-07: qty 60

## 2021-09-07 MED ORDER — SODIUM CHLORIDE 0.9 % IV SOLN
Freq: Once | INTRAVENOUS | Status: AC
  Filled 2021-09-07: qty 250

## 2021-09-07 MED ORDER — HEPARIN SOD (PORK) LOCK FLUSH 100 UNIT/ML IV SOLN
INTRAVENOUS | Status: AC
  Filled 2021-09-07: qty 5

## 2021-09-07 MED ORDER — HEPARIN SOD (PORK) LOCK FLUSH 100 UNIT/ML IV SOLN
500.0000 [IU] | Freq: Once | INTRAVENOUS | Status: DC | PRN
  Filled 2021-09-07: qty 5

## 2021-09-07 NOTE — Progress Notes (Signed)
Patient here for pre treatment check. He is recovering from the flu. No other complaints.

## 2021-09-07 NOTE — Patient Instructions (Signed)
[facility]  Discharge Instructions: Thank you for choosing [facility] to provide your oncology and hematology care.  If you have a lab appointment with the [facility], please go directly to the [facility] and check in at the registration area.  Wear comfortable clothing and clothing appropriate for easy access to any Portacath or PICC line.   We strive to give you quality time with your provider. You may need to reschedule your appointment if you arrive late (15 or more minutes).  Arriving late affects you and other patients whose appointments are after yours.  Also, if you miss three or more appointments without notifying the office, you may be dismissed from the clinic at the provider's discretion.      For prescription refill requests, have your pharmacy contact our office and allow 72 hours for refills to be completed.    Today you received the following chemotherapy and/or immunotherapy agents Darzalex       To help prevent nausea and vomiting after your treatment, we encourage you to take your nausea medication as directed.  BELOW ARE SYMPTOMS THAT SHOULD BE REPORTED IMMEDIATELY: *FEVER GREATER THAN 100.4 F (38 C) OR HIGHER *CHILLS OR SWEATING *NAUSEA AND VOMITING THAT IS NOT CONTROLLED WITH YOUR NAUSEA MEDICATION *UNUSUAL SHORTNESS OF BREATH *UNUSUAL BRUISING OR BLEEDING *URINARY PROBLEMS (pain or burning when urinating, or frequent urination) *BOWEL PROBLEMS (unusual diarrhea, constipation, pain near the anus) TENDERNESS IN MOUTH AND THROAT WITH OR WITHOUT PRESENCE OF ULCERS (sore throat, sores in mouth, or a toothache) UNUSUAL RASH, SWELLING OR PAIN  UNUSUAL VAGINAL DISCHARGE OR ITCHING   Items with * indicate a potential emergency and should be followed up as soon as possible or go to the Emergency Department if any problems should occur.  Please show the CHEMOTHERAPY ALERT CARD or IMMUNOTHERAPY ALERT CARD at check-in  to the Emergency Department and triage nurse.  Should you have questions after your visit or need to cancel or reschedule your appointment, please contact [facility]  [phone] and follow the prompts.  Office hours are 8:00 a.m. to 4:30 p.m. Monday - Friday. Please note that voicemails left after 4:00 p.m. may not be returned until the following business day.  We are closed weekends and major holidays. You have access to a nurse at all times for urgent questions. Please call the main number to the clinic [phone] and follow the prompts.  For any non-urgent questions, you may also contact your provider using MyChart. We now offer e-Visits for anyone 65 and older to request care online for non-urgent symptoms. For details visit [email or web address].   Also download the MyChart app! Go to the app store, search "MyChart", open the app, select [facility], and log in with your MyChart username and password.  Due to Covid, a mask is required upon entering the hospital/clinic. If you do not have a mask, one will be given to you upon arrival. For doctor visits, patients may have 1 support person aged 37 or older with them. For treatment visits, patients cannot have anyone with them due to current Covid guidelines and our immunocompromised population.

## 2021-09-07 NOTE — Progress Notes (Signed)
With port access this AM, tinge of blood was noted but would not provide great blood return. Port flushed well several times (approx. 10 NS flushes) with no edema or pain present. Dr. Rogue Bussing made aware. Labs collected peripherally.   Approx. 1258, first rate of Darzalex completed (first 30 minutes), RN paused treatment and proceeded to obtain VS. Patient then mentioned that his shirt was wet. RN assessed site and noted wetness, edema, and redness to area. IVF were stopped completely. Dr. Rogue Bussing and Beckey Rutter, NP contacted. Beckey Rutter, NP presented to chairside. Advised to trace redness. Redness outlined. Patient denies any pain at this time. Per Beckey Rutter, NP, deaccess port and discontinue treatment. Port deaccessed and patient educated on s/s as to when to seek care. Patient verbalizes understanding and denies any further questions or concerns.

## 2021-09-07 NOTE — Progress Notes (Signed)
[place] NOTE  Patient Care Team: Sofie Hartigan, MD as PCP - General (Family Medicine) Corey Skains, MD as Consulting Physician (Internal Medicine) Crissie Sickles, MD as Referring Physician (Hematology and Oncology) Lucilla Lame, MD as Consulting Physician (Gastroenterology) Milinda Pointer, MD as Referring Physician (Pain Medicine) Milinda Pointer, MD as Referring Physician (Pain Medicine) Cammie Sickle, MD as Consulting Physician (Hematology and Oncology)  CHIEF COMPLAINTS/PURPOSE OF CONSULTATION: MULTIPLE MYELOMA  #  Oncology History Overview Note  with stage III mutiple myeloma.  He initially presented with progressive back pain beginning in 12/2006.  MRI revealed "spots and compression fractures".  He began Velcade, thalidomide, and Decadron.  In 08/2007, he underwent high dose chemotherapy and autologous stem cell transplant.  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   He recurred with a rising M-spike (2.7) with repeat M spike (1.7 gm/dl) in 03/2010.  He was initially treated with Velcade (02/08/2010 - 05/10/2010).  He then began Revlimid (15 mg 3 weeks on/1 week off) and Decadron (40 mg on day 1, 8, 15, 22).  Because of significant side effect with Decadron his dose was decreased to 10 mg once a week in 07/2010.     He was on maintenance Revlimid.  Revlimid was initially10 mg 3 weeks on/1 week off.  This was changed to 10 mg 2 weeks on/2 weeks off secondary to right nipple tenderness.  His dose was increased to 10 mg 3 weeks on/1 week off with Decadron 10 mg a week (on Sundays) and then Revlamid 15 mg 3 weeks on and 1 week off with Decadron on Sundays.  He began Pomalyst 4 mg 3 weeks on/1 week off with Decadron on 08/27/2015.   SPEP revealed no monoclonal protein (04/21/2015) and 0.5 gm/dL on 09/22/2015 and 10/20/2015.  M spike was 0.1 on 02/02/2016, 03/01/2016, 03/29/2016, 04/26/2016, and 05/24/2016.  M spike was  0 on 10/11/2016, 11/28/2016, 02/05/2017, 03/21/2017, 08/02/2017, 10/04/2017, 12/27/2017, 01/24/2018, and 02/21/2018.  M spike was 0.1 on 11/01/2017, 0.1 on 11/29/2017, 0 on 12/27/2017, 0 on 01/24/2018, 0 on 02/21/2018, 0.1 on 03/21/2018, 0.3 on 04/18/2018, 0.1 on 05/16/2018, 0 on 06/13/2018, 0.2 on 08/08/2018, 0 on 09/09/2018, 0.1 on 11/04/2018, 0 on 12/02/2018, 0.1 on 12/30/2018, 0 on 01/27/2019, 0.1 on 02/24/2019, 0.1 on 03/24/2019, 0.1 on 04/23/2019, 0 on 05/19/2019, 0.1 on 06/17/2019, 0.2 on 07/15/2019, 0.2 on 08/18/2019, 0.2 on 09/15/2019, 0.1 on 10/13/2019, 0.1 on 11/13/2019, 0 on 12/11/2019, 0.3 on 01/08/2020, 0.2 on 02/05/2020, 0.2 on 03/04/2020, 0.2 on 04/01/2020, 0 on 05/05/2020, 0.4 on 06/07/2020, 0.4 on 07/06/2020, 0.3 on 08/03/2020, 0.1 on 08/31/2020, 0.3 on 09/28/2020, and 0.2 on 10/26/2020.    Free light chains have been monitored.  Kappa free light chains were 18.54 on 11/21/2013, 18.37 on 02/20/2014, 18.93 on 05/22/2014, 32.58 (high; normal ratio 1.27) on 08/21/2014, 51.53 (high; elevated ratio of 2.12) on 11/13/2014, 28.08 (ratio 1.73) on 12/09/2014, 23.71 (ratio 2.17) on 01/18/2015, 92.93 (ratio 9.49) on 04/21/2015, 93.44 (ratio 10.28) on 05/26/2015, 255.45 (ratio of 24.05) on 07/14/2015, 373.89 (ratio 48.31) on 08/04/2015, 474.33 (ratio 70.58) on 08/25/2015, 450.76 (ratio 55.44) on 09/22/2015, 453.4 (ratio 59.89) on 10/20/2015, 58.26 (ratio of > 40.74) on 02/02/2016, 62.67 (ratio of 37.98) on 03/01/2016, 75.7 (ratio > 50.47) on 03/29/2016, 79.7 (ratio 53.13) on 04/26/2016, 108.6 (ratio > 72.4) on 05/24/2016, 8.3 (1.46 ratio) on 10/11/2016, 6.7 (0.81 ratio) on 02/05/2017, 7.8 (1.01 ratio) on 03/21/2017,7.6 (ratio 0.63) on 06/01/2017, 8.1 (ratio 1.13) on 11/29/2017, 9.6 (ratio 1.55) on  06/13/2018, 6.6 (ratio 2.06) on 07/11/2018, 6.9 (ratio 0.82) on 08/08/2018, 5.8 (ratio 1.66) on 09/09/2018, 5.8 (ratio 1.05) on 11/04/2018, 5.6 (ratio 2.55) on 12/02/2018, 5.5 (ratio 1.96) on 01/27/2019, 7.6 (ratio  2.38) on 06/17/2019, 6.9 (ratio 1.21) on 11/13/2019, 6.6 (ratio 2.0) on 12/11/2019, 7.9 (ratio 2.14) on 02/05/2020, 6.6 (ratio 2.28) on 04/01/2020, 7.0 (ratio 2.12) on 05/05/2020, 9.3 (ratio 2.16) on 07/06/2020,  7.1 (ratio 2.09) on 08/31/2020, and 6.9 (ratio 2.09) on 10/26/2020.   Bone survey on 12/08/2014 was stable.  Bone survey on 10/21/2015 revealed increase conspicuity of subcentimeter lytic lesions in the calvarium.  PET scan on 08/14/2018 revealed no evidence of active myeloma.  Bone survey on 11/04/2019 revealed stable small lytic lesions throughout the skull.  There were no other lytic lesions.   Bone marrow aspirate and biopsy on 11/04/2015 revealed an atypical monoclonal plasma cells estimated at 30-40% of marrow cells.   Marrow was variably cellular (approximately 45%) with background trilineage hematopoiesis. There was no significant increase in marrow reticulin fibers. Storage iron was present.     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  He develoed herpes zoster in 04/2008.  He developed a pulmonary embolism in 05/2013.  He was initially on Xarelto, but is now on Eliquis.  He had an episode of pneumonia around this time requiring a brief admission.  He developed severe lower leg cramps on 08/07/2014 secondary to hypokalemia.  Duplex was negative.   He was treated for C difficile colitis (Flagyl completed 07/30/2015).  He has a chronic indwelling pain pump.   He received 4 cycles of Pomalyst and Decadron (08/27/2015 - 11/19/2015).  Restaging studies document progressive disease.  Kappa free light chains are increasing.  SPEP revealed 0.5 gm/dL monoclonal protein then 1.3 gm/dL.  Bone survey reveals increase conspicuity of subcentimeter lytic lesions in the calvarium.  Bone marrow reveals 30-40% plasma cells.   MUGA on 11/30/2015 revealed an ejection fraction of 46%.  He is felt not to be a good candidate for Kyprolis.  There were  no focal wall motion abnormalities. He had a stress echo less than 1 year ago.  He has a history of PVCs and atrial fibrillation.  He takes Cardizem prn.   He received 17 weeks of daratumumab (Darzalex) (12/09/2015 - 05/25/2016).  He tolerated treatment well without side effect.   Bone marrow on 02/09/2016 revealed no diagnostic morphologic evidence of plasma cell myeloma.  Marrow was normocellular to hypocellular marrow for age (ranging from 10-40%) with maturing trilineage hematopoiesis and mild multilineage dyspoiesis.  There was patchy mild increase in reticulin.  Storage iron was present.  Flow cytometry revealed no definitive evidence of monoclonality.  There was a non-specific atypical myeloid and monocytic findings with no increase in blasts.  Cytogenetics were normal (46, XY).   He is currently 54 months s/p 2nd autologous stem cell transplant on 06/16/2016.  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  He also experienced atrial fibrillation with intermittent episodes of RVR requiring IV beta blockers.  He is on prophylactic valacyclovir for 1 year post transplant.   He started vaccinations (DTaP-Pediatric triple vaccine, Hep B- Pediatrix triple vaccine, Haemophilus influenza B (Hib), inactivated polio virus (IPV), pneumococcal conjugate vaccine 13-valent (PCV 13)) on 04/17/2017.  Recommendation was for Shingrix vaccine to be given in [place] and second dose of Shingrix in 2 months.  He had follow-up vaccinations on 08/28/2017.  He had his second  shingles vaccine. He received 18 month vaccinations - DTaP-Pediatric triple vaccine, Hep B- Pediatrix triple vaccine, Haemophilus influenza B (Hib), inactivated polio virus (IPV), pneumococcal conjugate vaccine 13-valent (PCV 13) on 02/05/2018.   He is s/p 52 cycles of daratumumab (Darzalex) post transplant (12/05/2016 - 12/27/20).   PET scan on 08/14/2018 revealed no evidence of active myeloma on  whole-body FDG PET scan.  There was no evidence soft tissue plasmacytoma.  There was no clear evidence of lytic lesions on the CT portion exam. Some lucencies in the pelvis were stable. There were chronic compression fractures in the lower thoracic spine.  Bone survey on 11/04/2019 revealed stable small lytic lesions noted throughout the skull.  There were no other lytic lesions. Exam was stable.  Bone survey on 11/10/2020 revealed no focal lytic lesions.  There are stable compression fractures of T5, T10 and T12.    He has chronic diarrhea. GI panel by PCR was + enteropathogenic E coli (EPEC) on 11/30/2020 and 12/13/2020.  Stool was + for adenovirus on 11/30/2020.  Stool was negative for EPEC on 12/22/2020.  He was treated with ciprofloxacin (last 12/21/2020).   He has a history of hypomagnesemia that required IV magnesium (2-4 gm) weekly.  He has not required magnesium since 10/24/2016.  Patient has atrial fibrillation and is on Eliquis.   Multiple myeloma in remission ([place])  09/19/2016 Initial Diagnosis   Multiple myeloma in remission ([place])      HISTORY OF PRESENTING ILLNESS:  Patient is alone. Ambulating independently.   Logan Perez 75 y.o.  male multiple myeloma currently on daratumumab/Dex is here for follow up.  Patient states that in the interim had episode of upper respite tract infection with cough and low-grade fever.  Resolved by itself.  Did not get COVID checked.  Continues to have tingling and numbness in the lower extremities.  Not any worse.  Chronic back pain.  Chronic mild diarrhea not any worse.  Review of Systems  Constitutional:  Negative for chills, diaphoresis, fever, malaise/fatigue and weight loss.  HENT:  Negative for nosebleeds and sore throat.   Eyes:  Negative for double vision.  Respiratory:  Negative for cough, hemoptysis, sputum production, shortness of breath and wheezing.   Cardiovascular:  Negative for chest pain, palpitations, orthopnea and  leg swelling.  Gastrointestinal:  Positive for diarrhea. Negative for abdominal pain, blood in stool, constipation, heartburn, melena, nausea and vomiting.  Genitourinary:  Negative for dysuria, frequency and urgency.  Musculoskeletal:  Positive for back pain and joint pain.  Skin: Negative.  Negative for itching and rash.  Neurological:  Positive for tingling. Negative for dizziness, focal weakness, weakness and headaches.  Endo/Heme/Allergies:  Does not bruise/bleed easily.  Psychiatric/Behavioral:  Negative for depression. The patient is not nervous/anxious and does not have insomnia.     MEDICAL HISTORY:  Past Medical History:  Diagnosis Date   Anxiety    Atrial fibrillation (HCC)    BPH (benign prostatic hyperplasia)    Complication of anesthesia    BAD HEADACHE NIGHT OF FIRST CATARACT   Compression fracture of lumbar vertebra (HCC)    Difficulty voiding    Dysrhythmia    A FIB   Elevated PSA    GERD (gastroesophageal reflux disease)    Hearing aid worn    bilateral   History of kidney stones    HLD (hyperlipidemia)    HOH (hard of hearing)    Hypertension    Multiple myeloma (HCC)    Neuropathy  feet. 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   Pain    BACK   Palpitations    Pneumonia    Pulmonary embolism (HCC)    Sepsis (HCC)    Stroke (HCC)    TIA, detected on CT scan. pt was unaware    SURGICAL HISTORY: Past Surgical History:  Procedure Laterality Date   BACK SURGERY  1994   CATARACT EXTRACTION W/PHACO Right 05/02/2016   Procedure: CATARACT EXTRACTION PHACO AND INTRAOCULAR LENS PLACEMENT ([place]);  Surgeon: Birder Robson, MD;  Location: [facility];  Service: Ophthalmology;  Laterality: Right;  [place] 1.06 AP% 20.6 CDE 13.70 FLUID PACK LOT # [id number]   CATARACT EXTRACTION W/PHACO Left 05/16/2016   Procedure: CATARACT EXTRACTION PHACO AND INTRAOCULAR LENS PLACEMENT ([facility]);  Surgeon: Birder Robson, MD;  Location: [facility];  Service: Ophthalmology;  Laterality: Left;  [place] 01:43 AP%  19.8 CDE 20.45 FLUID PACK LOT #[id number] H   COLONOSCOPY WITH PROPOFOL N/A 03/22/2017   Procedure: COLONOSCOPY WITH PROPOFOL;  Surgeon: Lucilla Lame, MD;  Location: [facility];  Service: Endoscopy;  Laterality: N/A;  has port   ESOPHAGOGASTRODUODENOSCOPY (EGD) WITH PROPOFOL  03/22/2017   Procedure: ESOPHAGOGASTRODUODENOSCOPY (EGD) WITH PROPOFOL;  Surgeon: Lucilla Lame, MD;  Location: [place];  Service: Endoscopy;;   EYE SURGERY     KNEE ARTHROSCOPY Left 1992   LIMBAL STEM CELL TRANSPLANT     PAIN PUMP IMPLANTATION  2012   PAIN PUMP IMPLANTATION N/A 09/04/2017   Procedure: INTRATHECAL PUMP BATTERY CHANGE;  Surgeon: Milinda Pointer, MD;  Location: [facility];  Service: Neurosurgery;  Laterality: N/A;   PORTA CATH INSERTION N/A 12/04/2016   Procedure: Glori Luis Cath Insertion;  Surgeon: Algernon Huxley, MD;  Location: [place] [facility];  Service: Cardiovascular;  Laterality: N/A;   stem cell implant  2008   [facility]    SOCIAL HISTORY: Social History   Socioeconomic History   Marital status: Married    Spouse name: Not on file   Number of children: Not on file   Years of education: Not on file   Highest education level: Not on file  Occupational History   Not on file  Tobacco Use   Smoking status: Former    Packs/day: 2.50    Years: 12.00    Pack years: 30.00    Types: Cigarettes    Quit date: 44    Years since quitting: 45.9   Smokeless tobacco: Never   Tobacco comments:    Quit 40 years ago  Electronics engineer Use   Vaping Use: Never used  Substance and Sexual Activity   Alcohol use: No    Alcohol/week: 0.0 standard drinks    Comment: might have 1 beer/month   Drug use: No   Sexual activity: Not on file  Other Topics Concern   Not on file  Social History Narrative   Not on file   Social Determinants of Health   Financial Resource Strain: Not on file  Food Insecurity: Not on file  Transportation Needs: Not on file  Physical Activity: Not on file  Stress: Not on  file  Social Connections: Not on file  Intimate Partner Violence: Not on file    FAMILY HISTORY: Family History  Problem Relation Age of Onset   Cancer Father        throat   Kidney disease Sister    Stroke Other    Stroke Mother    Bladder Cancer Neg Hx    Prostate cancer Neg Hx    Kidney cancer Neg  Hx     ALLERGIES:  is allergic to ciprofloxacin, azithromycin, bee pollen, pollen extract, zoledronic acid, and rivaroxaban.  MEDICATIONS:  Current Outpatient Medications  Medication Sig Dispense Refill   acetaminophen (TYLENOL) 500 MG tablet Take 500 mg by mouth 2 (two) times a day.     baclofen (LIORESAL) 10 MG tablet Take 1 tablet by mouth 2 (two) times daily.     bismuth subsalicylate (PEPTO BISMOL) 262 MG/15ML suspension Take 30 mLs by mouth every 6 (six) hours as needed for diarrhea or loose stools.     calcium citrate-vitamin D (CITRACAL+D) 315-200 MG-UNIT tablet Take 1 tablet by mouth 2 (two) times daily.     cetirizine (ZYRTEC) 10 MG tablet Take 10 mg by mouth daily as needed for allergies.     Cholecalciferol (VITAMIN D3) 2000 units capsule Take 2,000 Units daily by mouth.      Daratumumab (DARZALEX IV) Inject 1,200 mg every 30 (thirty) days into the vein.      dexamethasone (DECADRON) 4 MG tablet TAKE 4 TABLETS BY MOUTH 1 HOUR PRIOR TO INFUSION, THEN 1 TABLET ON DAYS 2 AND 3 AS DIRECTED 30 tablet 0   diltiazem (CARDIZEM CD) 120 MG 24 hr capsule Take 120 mg daily by mouth.      diltiazem (CARDIZEM) 60 MG tablet Take 60 mg by mouth daily as needed (for increased heart rate > 140).     diphenhydrAMINE (BENADRYL) 25 MG tablet Take 25 mg by mouth as directed. With chemo treatment     ELIQUIS 5 MG TABS tablet TAKE 1 TABLET TWICE A DAY 180 tablet 1   fluticasone (FLONASE) 50 MCG/ACT nasal spray Place 1 spray into the nose daily as needed for allergies.     Hypromellose (ARTIFICIAL TEARS OP) Place 1 drop into both eyes as needed (for dry eyes).     loperamide (IMODIUM) 2 MG  capsule Take 4 mg by mouth as needed for diarrhea or loose stools.     loratadine (CLARITIN) 10 MG tablet Take 10 mg by mouth daily as needed.     lovastatin (MEVACOR) 20 MG tablet Take 20 mg by mouth every evening.      montelukast (SINGULAIR) 10 MG tablet Take 1 tablet by mouth on the day before, day of, and 2 days after treatment. 30 tablet 1   Multiple Vitamins-Iron (MULTIVITAMIN/IRON PO) Take 1 tablet by mouth daily.      naloxone (NARCAN) nasal spray 4 mg/0.1 mL Place 1 spray into the nose as needed for up to 365 doses (for opioid-induced respiratory depresssion). In case of emergency (overdose), spray once into each nostril. If no response within 3 minutes, repeat application and call [phone]. 1 each 0   NON FORMULARY IT pump  Fentanyl 1,500.0 mcg/ml Bupivicaine 30.0 mg/ml Clonidine 300.0 mcg/ml Rate 500.4 mg/day     omeprazole (PRILOSEC) 20 MG capsule Take 20 mg by mouth daily.      ondansetron (ZOFRAN) 8 MG tablet Take 1 tablet (8 mg total) by mouth as directed. Take with chemo and can take it as needed 20 tablet 1   PAIN MANAGEMENT INTRATHECAL, IT, PUMP 1 each by Intrathecal route. Intrathecal (IT) medication:  Fentanyl 1500 mcg/ml, bupivicaine 30.0 mg/ml, clonidine 300.0 mcg/ml Daily dose fent,.500.20mcg/day, bupivacaine 10.007mg /day, clonidine 1000.07 mcg/day     tamsulosin (FLOMAX) 0.4 MG CAPS capsule Take 1 capsule (0.4 mg total) by mouth daily. 90 capsule 3   valACYclovir (VALTREX) 500 MG tablet TAKE 1 TABLET DAILY 90 tablet 3  vitamin B-12 (CYANOCOBALAMIN) 1000 MCG tablet Take 1,000 mcg daily by mouth.      No current facility-administered medications for this visit.   Facility-Administered Medications Ordered in Other Visits  Medication Dose Route Frequency Provider Last Rate Last Admin   heparin lock flush 100 UNIT/ML injection            heparin lock flush 100 unit/mL  500 Units Intracatheter Once PRN Cammie Sickle, MD          .  PHYSICAL EXAMINATION: ECOG  PERFORMANCE STATUS: 1 - Symptomatic but completely ambulatory  Vitals:   09/07/21 1132  BP: 140/84  Pulse: 60  Resp: 20  Temp: (!) 97 F (36.1 C)  SpO2: 98%   Filed Weights   09/07/21 1132  Weight: 164 lb (74.4 kg)    Physical Exam Vitals and nursing note reviewed.  HENT:     Head: Normocephalic and atraumatic.     Mouth/Throat:     Mouth: Mucous membranes are moist.     Pharynx: No oropharyngeal exudate.  Eyes:     Pupils: Pupils are equal, round, and reactive to light.  Cardiovascular:     Rate and Rhythm: Normal rate and regular rhythm.  Pulmonary:     Effort: No respiratory distress.     Breath sounds: No wheezing.     Comments: Decreased breath sounds bilaterally at bases.  No wheeze or crackles Abdominal:     General: Bowel sounds are normal. There is no distension.     Palpations: Abdomen is soft. There is no mass.     Tenderness: There is no abdominal tenderness. There is no guarding or rebound.  Musculoskeletal:        General: No tenderness. Normal range of motion.     Cervical back: Normal range of motion and neck supple.  Skin:    General: Skin is warm.  Neurological:     Mental Status: He is alert and oriented to person, place, and time.  Psychiatric:        Mood and Affect: Affect normal.        Judgment: Judgment normal.    LABORATORY DATA:  I have reviewed the data as listed Lab Results  Component Value Date   WBC 7.9 09/07/2021   HGB 13.2 09/07/2021   HCT 38.0 (L) 09/07/2021   MCV 98.7 09/07/2021   PLT 193 09/07/2021   Recent Labs    07/12/21 0823 08/09/21 0953 09/07/21 0942  NA 136 133* 135  K 4.1 4.1 3.8  CL 101 100 108  CO2 $Re'24 24 22  'eXI$ GLUCOSE 146* 177* 102*  BUN $Re'20 20 15  'jPy$ CREATININE 1.49* 1.42* 1.32*  CALCIUM 8.8* 8.9 8.6*  GFRNONAA 49* 52* 56*  PROT 6.4* 6.3* 6.4*  ALBUMIN 3.9 4.2 3.9  AST $Re'23 24 23  'Qhe$ ALT $R'15 16 24  'ke$ ALKPHOS 49 44 52  BILITOT 0.5 0.7 0.5    RADIOGRAPHIC STUDIES: I have personally reviewed the radiological  images as listed and agreed with the findings in the report. No results found.  ASSESSMENT & PLAN:   Multiple myeloma in remission ([place]) # Multiple myeloma: Status post autologous and cell transplant-s/p relapse currently on daratumumab- M spike fluctuates between 0.0 - 0.5 gm/dL in the past year. June 2022- M protein- NEG [IF; K/L 1.76]. ] PET scan on 08/14/2018 revealed no evidence of active myeloma. Bone survey on 11/10/2020 revealed no focal lytic lesions. There were stable compression fractions of T5, T10 and T12. NOV 2022-  M protein-NONE;IFE-positive/? dara; K/l- 1.0- STABLE.   # proceed with daratumumab today. Labs today reviewed;  acceptable for treatment today. SQ dex; dexamethasone 24 mg weekly on pre-meds [from home]; check 24-hour urine collection also  # PN/restless leg syndrome- [sec to thalilomid]- on baclofen 5-10 qhs. STABLE.  # Chronic diarrhea: [2-3 times a day] on immodium prn-  STABLE.   # ONJ- discontinued Zometa- STABLE.   # History of pulmonary embolism/ atrial fibrillation [Dr.Kowalski] on Eliquis- STABLE.    # CKD- stage III-  STABLE.   # Compression fractures sec to MM- s/p Pain pump [Dr.Naveira; 2012]; STABLE.   # Vaccination: s/p Flu shots [2022]; UNVACCINATED to [place]. Discussed re: EVUSHELD; will get back to [place].   #Mediport-malfunction check port study   # DISPOSITION: wants 9:30 appts # Proceed with daratumumab today.  # follow up in 4 weeks- MD; labs [facility] with diff, CMP, Mg, MM panel;K/L ratio]- Dara IV- Dr.B  All questions were answered. The patient knows to call the clinic with any problems, questions or concerns.    Cammie Sickle, MD 09/07/2021 12:49 PM

## 2021-09-07 NOTE — Assessment & Plan Note (Addendum)
#  Multiple myeloma: Status post autologous and cell transplant-s/p relapse currently on daratumumab- M spike fluctuates between 0.0 - 0.5 gm/dL in the past year. June 2022- M protein- NEG [IF; K/L 1.76]. ] PET scan on 08/14/2018 revealed no evidence of active myeloma. Bone survey on 11/10/2020 revealed no focal lytic lesions. There were stable compression fractions of T5, T10 and T12. NOV 2022- M protein-NONE;IFE-positive/? dara; K/l- 1.0- STABLE.   # proceed with daratumumab today. Labs today reviewed;  acceptable for treatment today. SQ dex; dexamethasone 24 mg weekly on pre-meds [from home]; check 24-hour urine collection also  # PN/restless leg syndrome- [sec to thalilomid]- on baclofen 5-10 qhs. STABLE.  # Chronic diarrhea: [2-3 times a day]on immodium prn-  STABLE.   # ONJ- discontinued Zometa- STABLE.   # History of pulmonary embolism/ atrial fibrillation [Dr.Kowalski] on Eliquis- STABLE.    # CKD- stage III-  STABLE.   # Compression fractures sec to MM- s/p Pain pump [Dr.Naveira; 2012]; STABLE.   # Vaccination: s/p Flu shots [2022]; UNVACCINATED to [place]. Discussed re: EVUSHELD; will get back to us.   #Mediport-malfunction check port study  # DISPOSITION: wants 9:30 appts # Proceed with daratumumab today.  # follow up in 4 weeks- MD; labs [CBC with diff, CMP, Mg, MM panel;K/L ratio]- Dara IV- Dr.B 

## 2021-09-07 NOTE — Progress Notes (Signed)
1202: Port flushed multiple times without difficulty, edema, redness or pain.  No blood return noted, Dr. Rogue Bussing aware. Per Dr. Rogue Bussing proceed with treatment using port a cath and pt to have a dye study scheduled. MD team aware.   Per Beckey Rutter NP deaccess port and hold remainder of treatment. Pt to be scheduled for dye study. Per Beckey Rutter NP, MD and MD team aware of plan. Pt educated when to seek medical care. Pt verbalizes understanding and pt stable at discharge.

## 2021-09-08 DIAGNOSIS — C9002 Multiple myeloma in relapse: Secondary | ICD-10-CM | POA: Insufficient documentation

## 2021-09-08 LAB — KAPPA/LAMBDA LIGHT CHAINS
Kappa free light chain: 10.1 mg/L (ref 3.3–19.4)
Kappa, lambda light chain ratio: 2.97 — ABNORMAL HIGH (ref 0.26–1.65)
Lambda free light chains: 3.4 mg/L — ABNORMAL LOW (ref 5.7–26.3)

## 2021-09-09 ENCOUNTER — Other Ambulatory Visit: Payer: Self-pay

## 2021-09-09 DIAGNOSIS — C9002 Multiple myeloma in relapse: Secondary | ICD-10-CM

## 2021-09-12 LAB — MULTIPLE MYELOMA PANEL, SERUM
Albumin SerPl Elph-Mcnc: 4 g/dL (ref 2.9–4.4)
Albumin/Glob SerPl: 1.7 (ref 0.7–1.7)
Alpha 1: 0.2 g/dL (ref 0.0–0.4)
Alpha2 Glob SerPl Elph-Mcnc: 0.9 g/dL (ref 0.4–1.0)
B-Globulin SerPl Elph-Mcnc: 1 g/dL (ref 0.7–1.3)
Gamma Glob SerPl Elph-Mcnc: 0.3 g/dL — ABNORMAL LOW (ref 0.4–1.8)
Globulin, Total: 2.4 g/dL (ref 2.2–3.9)
IgA: 19 mg/dL — ABNORMAL LOW (ref 61–437)
IgG (Immunoglobin G), Serum: 315 mg/dL — ABNORMAL LOW (ref 603–1613)
IgM (Immunoglobulin M), Srm: 33 mg/dL (ref 15–143)
Total Protein ELP: 6.4 g/dL (ref 6.0–8.5)

## 2021-09-12 LAB — IFE+PROTEIN ELECTRO, 24-HR UR
% BETA, Urine: 27.6 %
ALPHA 1 URINE: 10 %
Albumin, U: 29.6 %
Alpha 2, Urine: 25.9 %
GAMMA GLOBULIN URINE: 7 %
Total Protein, Urine-Ur/day: 109 mg/24 hr (ref 30–150)
Total Protein, Urine: 8.7 mg/dL
Total Volume: 1250

## 2021-09-16 MED FILL — Medication: INTRATHECAL | Qty: 1 | Status: AC

## 2021-09-21 ENCOUNTER — Ambulatory Visit: Admission: RE | Admit: 2021-09-21 | Payer: Medicare Other | Source: Ambulatory Visit | Admitting: Radiology

## 2021-09-21 ENCOUNTER — Ambulatory Visit: Payer: Medicare Other

## 2021-10-05 ENCOUNTER — Inpatient Hospital Stay: Payer: Medicare Other

## 2021-10-05 ENCOUNTER — Inpatient Hospital Stay: Payer: Medicare Other | Admitting: Internal Medicine

## 2021-10-05 NOTE — Progress Notes (Deleted)
[place] NOTE  Patient Care Team: Sofie Hartigan, MD as PCP - General (Family Medicine) Corey Skains, MD as Consulting Physician (Internal Medicine) Crissie Sickles, MD as Referring Physician (Hematology and Oncology) Lucilla Lame, MD as Consulting Physician (Gastroenterology) Milinda Pointer, MD as Referring Physician (Pain Medicine) Milinda Pointer, MD as Referring Physician (Pain Medicine) Cammie Sickle, MD as Consulting Physician (Hematology and Oncology)  CHIEF COMPLAINTS/PURPOSE OF CONSULTATION: MULTIPLE MYELOMA  #  Oncology History Overview Note  with stage III mutiple myeloma.  He initially presented with progressive back pain beginning in 12/2006.  MRI revealed "spots and compression fractures".  He began Velcade, thalidomide, and Decadron.  In 08/2007, he underwent high dose chemotherapy and autologous stem cell transplant.  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   He recurred with a rising M-spike (2.7) with repeat M spike (1.7 gm/dl) in 03/2010.  He was initially treated with Velcade (02/08/2010 - 05/10/2010).  He then began Revlimid (15 mg 3 weeks on/1 week off) and Decadron (40 mg on day 1, 8, 15, 22).  Because of significant side effect with Decadron his dose was decreased to 10 mg once a week in 07/2010.     He was on maintenance Revlimid.  Revlimid was initially10 mg 3 weeks on/1 week off.  This was changed to 10 mg 2 weeks on/2 weeks off secondary to right nipple tenderness.  His dose was increased to 10 mg 3 weeks on/1 week off with Decadron 10 mg a week (on Sundays) and then Revlamid 15 mg 3 weeks on and 1 week off with Decadron on Sundays.  He began Pomalyst 4 mg 3 weeks on/1 week off with Decadron on 08/27/2015.   SPEP revealed no monoclonal protein (04/21/2015) and 0.5 gm/dL on 09/22/2015 and 10/20/2015.  M spike was 0.1 on 02/02/2016, 03/01/2016, 03/29/2016, 04/26/2016, and 05/24/2016.  M spike was  0 on 10/11/2016, 11/28/2016, 02/05/2017, 03/21/2017, 08/02/2017, 10/04/2017, 12/27/2017, 01/24/2018, and 02/21/2018.  M spike was 0.1 on 11/01/2017, 0.1 on 11/29/2017, 0 on 12/27/2017, 0 on 01/24/2018, 0 on 02/21/2018, 0.1 on 03/21/2018, 0.3 on 04/18/2018, 0.1 on 05/16/2018, 0 on 06/13/2018, 0.2 on 08/08/2018, 0 on 09/09/2018, 0.1 on 11/04/2018, 0 on 12/02/2018, 0.1 on 12/30/2018, 0 on 01/27/2019, 0.1 on 02/24/2019, 0.1 on 03/24/2019, 0.1 on 04/23/2019, 0 on 05/19/2019, 0.1 on 06/17/2019, 0.2 on 07/15/2019, 0.2 on 08/18/2019, 0.2 on 09/15/2019, 0.1 on 10/13/2019, 0.1 on 11/13/2019, 0 on 12/11/2019, 0.3 on 01/08/2020, 0.2 on 02/05/2020, 0.2 on 03/04/2020, 0.2 on 04/01/2020, 0 on 05/05/2020, 0.4 on 06/07/2020, 0.4 on 07/06/2020, 0.3 on 08/03/2020, 0.1 on 08/31/2020, 0.3 on 09/28/2020, and 0.2 on 10/26/2020.    Free light chains have been monitored.  Kappa free light chains were 18.54 on 11/21/2013, 18.37 on 02/20/2014, 18.93 on 05/22/2014, 32.58 (high; normal ratio 1.27) on 08/21/2014, 51.53 (high; elevated ratio of 2.12) on 11/13/2014, 28.08 (ratio 1.73) on 12/09/2014, 23.71 (ratio 2.17) on 01/18/2015, 92.93 (ratio 9.49) on 04/21/2015, 93.44 (ratio 10.28) on 05/26/2015, 255.45 (ratio of 24.05) on 07/14/2015, 373.89 (ratio 48.31) on 08/04/2015, 474.33 (ratio 70.58) on 08/25/2015, 450.76 (ratio 55.44) on 09/22/2015, 453.4 (ratio 59.89) on 10/20/2015, 58.26 (ratio of > 40.74) on 02/02/2016, 62.67 (ratio of 37.98) on 03/01/2016, 75.7 (ratio > 50.47) on 03/29/2016, 79.7 (ratio 53.13) on 04/26/2016, 108.6 (ratio > 72.4) on 05/24/2016, 8.3 (1.46 ratio) on 10/11/2016, 6.7 (0.81 ratio) on 02/05/2017, 7.8 (1.01 ratio) on 03/21/2017,7.6 (ratio 0.63) on 06/01/2017, 8.1 (ratio 1.13) on 11/29/2017, 9.6 (ratio 1.55) on  06/13/2018, 6.6 (ratio 2.06) on 07/11/2018, 6.9 (ratio 0.82) on 08/08/2018, 5.8 (ratio 1.66) on 09/09/2018, 5.8 (ratio 1.05) on 11/04/2018, 5.6 (ratio 2.55) on 12/02/2018, 5.5 (ratio 1.96) on 01/27/2019, 7.6 (ratio  2.38) on 06/17/2019, 6.9 (ratio 1.21) on 11/13/2019, 6.6 (ratio 2.0) on 12/11/2019, 7.9 (ratio 2.14) on 02/05/2020, 6.6 (ratio 2.28) on 04/01/2020, 7.0 (ratio 2.12) on 05/05/2020, 9.3 (ratio 2.16) on 07/06/2020,  7.1 (ratio 2.09) on 08/31/2020, and 6.9 (ratio 2.09) on 10/26/2020.   Bone survey on 12/08/2014 was stable.  Bone survey on 10/21/2015 revealed increase conspicuity of subcentimeter lytic lesions in the calvarium.  PET scan on 08/14/2018 revealed no evidence of active myeloma.  Bone survey on 11/04/2019 revealed stable small lytic lesions throughout the skull.  There were no other lytic lesions.   Bone marrow aspirate and biopsy on 11/04/2015 revealed an atypical monoclonal plasma cells estimated at 30-40% of marrow cells.   Marrow was variably cellular (approximately 45%) with background trilineage hematopoiesis. There was no significant increase in marrow reticulin fibers. Storage iron was present.     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  He develoed herpes zoster in 04/2008.  He developed a pulmonary embolism in 05/2013.  He was initially on Xarelto, but is now on Eliquis.  He had an episode of pneumonia around this time requiring a brief admission.  He developed severe lower leg cramps on 08/07/2014 secondary to hypokalemia.  Duplex was negative.   He was treated for C difficile colitis (Flagyl completed 07/30/2015).  He has a chronic indwelling pain pump.   He received 4 cycles of Pomalyst and Decadron (08/27/2015 - 11/19/2015).  Restaging studies document progressive disease.  Kappa free light chains are increasing.  SPEP revealed 0.5 gm/dL monoclonal protein then 1.3 gm/dL.  Bone survey reveals increase conspicuity of subcentimeter lytic lesions in the calvarium.  Bone marrow reveals 30-40% plasma cells.   MUGA on 11/30/2015 revealed an ejection fraction of 46%.  He is felt not to be a good candidate for Kyprolis.  There were  no focal wall motion abnormalities. He had a stress echo less than 1 year ago.  He has a history of PVCs and atrial fibrillation.  He takes Cardizem prn.   He received 17 weeks of daratumumab (Darzalex) (12/09/2015 - 05/25/2016).  He tolerated treatment well without side effect.   Bone marrow on 02/09/2016 revealed no diagnostic morphologic evidence of plasma cell myeloma.  Marrow was normocellular to hypocellular marrow for age (ranging from 10-40%) with maturing trilineage hematopoiesis and mild multilineage dyspoiesis.  There was patchy mild increase in reticulin.  Storage iron was present.  Flow cytometry revealed no definitive evidence of monoclonality.  There was a non-specific atypical myeloid and monocytic findings with no increase in blasts.  Cytogenetics were normal (46, XY).   He is currently 54 months s/p 2nd autologous stem cell transplant on 06/16/2016.  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  He also experienced atrial fibrillation with intermittent episodes of RVR requiring IV beta blockers.  He is on prophylactic valacyclovir for 1 year post transplant.   He started vaccinations (DTaP-Pediatric triple vaccine, Hep B- Pediatrix triple vaccine, Haemophilus influenza B (Hib), inactivated polio virus (IPV), pneumococcal conjugate vaccine 13-valent (PCV 13)) on 04/17/2017.  Recommendation was for Shingrix vaccine to be given in [place] and second dose of Shingrix in 2 months.  He had follow-up vaccinations on 08/28/2017.  He had his second  shingles vaccine. He received 18 month vaccinations - DTaP-Pediatric triple vaccine, Hep B- Pediatrix triple vaccine, Haemophilus influenza B (Hib), inactivated polio virus (IPV), pneumococcal conjugate vaccine 13-valent (PCV 13) on 02/05/2018.   He is s/p 52 cycles of daratumumab (Darzalex) post transplant (12/05/2016 - 12/27/20).   PET scan on 08/14/2018 revealed no evidence of active myeloma on  whole-body FDG PET scan.  There was no evidence soft tissue plasmacytoma.  There was no clear evidence of lytic lesions on the CT portion exam. Some lucencies in the pelvis were stable. There were chronic compression fractures in the lower thoracic spine.  Bone survey on 11/04/2019 revealed stable small lytic lesions noted throughout the skull.  There were no other lytic lesions. Exam was stable.  Bone survey on 11/10/2020 revealed no focal lytic lesions.  There are stable compression fractures of T5, T10 and T12.    He has chronic diarrhea. GI panel by PCR was + enteropathogenic E coli (EPEC) on 11/30/2020 and 12/13/2020.  Stool was + for adenovirus on 11/30/2020.  Stool was negative for EPEC on 12/22/2020.  He was treated with ciprofloxacin (last 12/21/2020).   He has a history of hypomagnesemia that required IV magnesium (2-4 gm) weekly.  He has not required magnesium since 10/24/2016.  Patient has atrial fibrillation and is on Eliquis.   Multiple myeloma in remission ([place])  09/19/2016 Initial Diagnosis   Multiple myeloma in remission ([place])      HISTORY OF PRESENTING ILLNESS:  Patient is alone. Ambulating independently.   Logan Perez 75 y.o.  male multiple myeloma currently on daratumumab/Dex is here for follow up.  Patient states that in the interim had episode of upper respite tract infection with cough and low-grade fever.  Resolved by itself.  Did not get COVID checked.  Continues to have tingling and numbness in the lower extremities.  Not any worse.  Chronic back pain.  Chronic mild diarrhea not any worse.  Review of Systems  Constitutional:  Negative for chills, diaphoresis, fever, malaise/fatigue and weight loss.  HENT:  Negative for nosebleeds and sore throat.   Eyes:  Negative for double vision.  Respiratory:  Negative for cough, hemoptysis, sputum production, shortness of breath and wheezing.   Cardiovascular:  Negative for chest pain, palpitations, orthopnea and  leg swelling.  Gastrointestinal:  Positive for diarrhea. Negative for abdominal pain, blood in stool, constipation, heartburn, melena, nausea and vomiting.  Genitourinary:  Negative for dysuria, frequency and urgency.  Musculoskeletal:  Positive for back pain and joint pain.  Skin: Negative.  Negative for itching and rash.  Neurological:  Positive for tingling. Negative for dizziness, focal weakness, weakness and headaches.  Endo/Heme/Allergies:  Does not bruise/bleed easily.  Psychiatric/Behavioral:  Negative for depression. The patient is not nervous/anxious and does not have insomnia.     MEDICAL HISTORY:  Past Medical History:  Diagnosis Date   Anxiety    Atrial fibrillation (HCC)    BPH (benign prostatic hyperplasia)    Complication of anesthesia    BAD HEADACHE NIGHT OF FIRST CATARACT   Compression fracture of lumbar vertebra (HCC)    Difficulty voiding    Dysrhythmia    A FIB   Elevated PSA    GERD (gastroesophageal reflux disease)    Hearing aid worn    bilateral   History of kidney stones    HLD (hyperlipidemia)    HOH (hard of hearing)    Hypertension    Multiple myeloma (HCC)    Neuropathy  feet. 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   Pain    BACK   Palpitations    Pneumonia    Pulmonary embolism (HCC)    Sepsis (HCC)    Stroke (HCC)    TIA, detected on CT scan. pt was unaware    SURGICAL HISTORY: Past Surgical History:  Procedure Laterality Date   BACK SURGERY  1994   CATARACT EXTRACTION W/PHACO Right 05/02/2016   Procedure: CATARACT EXTRACTION PHACO AND INTRAOCULAR LENS PLACEMENT ([place]);  Surgeon: Birder Robson, MD;  Location: [facility];  Service: Ophthalmology;  Laterality: Right;  [place] 1.06 AP% 20.6 CDE 13.70 FLUID PACK LOT # [id number] H   CATARACT EXTRACTION W/PHACO Left 05/16/2016   Procedure: CATARACT EXTRACTION PHACO AND INTRAOCULAR LENS PLACEMENT ([facility]);  Surgeon: Birder Robson, MD;  Location: [facility];  Service: Ophthalmology;  Laterality:  Left;  [place] 01:43 AP% 19.8 CDE 20.45 FLUID PACK LOT #[id number] H   COLONOSCOPY WITH PROPOFOL N/A 03/22/2017   Procedure: COLONOSCOPY WITH PROPOFOL;  Surgeon: Lucilla Lame, MD;  Location: [facility];  Service: Endoscopy;  Laterality: N/A;  has port   ESOPHAGOGASTRODUODENOSCOPY (EGD) WITH PROPOFOL  03/22/2017   Procedure: ESOPHAGOGASTRODUODENOSCOPY (EGD) WITH PROPOFOL;  Surgeon: Lucilla Lame, MD;  Location: [facility];  Service: Endoscopy;;   EYE SURGERY     KNEE ARTHROSCOPY Left 1992   LIMBAL STEM CELL TRANSPLANT     PAIN PUMP IMPLANTATION  2012   PAIN PUMP IMPLANTATION N/A 09/04/2017   Procedure: INTRATHECAL PUMP BATTERY CHANGE;  Surgeon: Milinda Pointer, MD;  Location: [facility];  Service: Neurosurgery;  Laterality: N/A;   PORTA CATH INSERTION N/A 12/04/2016   Procedure: Glori Luis Cath Insertion;  Surgeon: Algernon Huxley, MD;  Location: [facility];  Service: Cardiovascular;  Laterality: N/A;   stem cell implant  2008   [facility]    SOCIAL HISTORY: Social History   Socioeconomic History   Marital status: Married    Spouse name: Not on file   Number of children: Not on file   Years of education: Not on file   Highest education level: Not on file  Occupational History   Not on file  Tobacco Use   Smoking status: Former    Packs/day: 2.50    Years: 12.00    Pack years: 30.00    Types: Cigarettes    Quit date: 53    Years since quitting: 46.0   Smokeless tobacco: Never   Tobacco comments:    Quit 40 years ago  Electronics engineer Use   Vaping Use: Never used  Substance and Sexual Activity   Alcohol use: No    Alcohol/week: 0.0 standard drinks    Comment: might have 1 beer/month   Drug use: No   Sexual activity: Not on file  Other Topics Concern   Not on file  Social History Narrative   Not on file   Social Determinants of Health   Financial Resource Strain: Not on file  Food Insecurity: Not on file  Transportation Needs: Not on file   Physical Activity: Not on file  Stress: Not on file  Social Connections: Not on file  Intimate Partner Violence: Not on file    FAMILY HISTORY: Family History  Problem Relation Age of Onset   Cancer Father        throat   Kidney disease Sister    Stroke Other    Stroke Mother    Bladder Cancer Neg Hx    Prostate cancer Neg Hx    Kidney cancer Neg  Hx     ALLERGIES:  is allergic to ciprofloxacin, azithromycin, bee pollen, pollen extract, zoledronic acid, and rivaroxaban.  MEDICATIONS:  Current Outpatient Medications  Medication Sig Dispense Refill   acetaminophen (TYLENOL) 500 MG tablet Take 500 mg by mouth 2 (two) times a day.     baclofen (LIORESAL) 10 MG tablet Take 1 tablet by mouth 2 (two) times daily.     bismuth subsalicylate (PEPTO BISMOL) 262 MG/15ML suspension Take 30 mLs by mouth every 6 (six) hours as needed for diarrhea or loose stools.     calcium citrate-vitamin D (CITRACAL+D) 315-200 MG-UNIT tablet Take 1 tablet by mouth 2 (two) times daily.     cetirizine (ZYRTEC) 10 MG tablet Take 10 mg by mouth daily as needed for allergies.     Cholecalciferol (VITAMIN D3) 2000 units capsule Take 2,000 Units daily by mouth.      Daratumumab (DARZALEX IV) Inject 1,200 mg every 30 (thirty) days into the vein.      dexamethasone (DECADRON) 4 MG tablet TAKE 4 TABLETS BY MOUTH 1 HOUR PRIOR TO INFUSION, THEN 1 TABLET ON DAYS 2 AND 3 AS DIRECTED 30 tablet 0   diltiazem (CARDIZEM CD) 120 MG 24 hr capsule Take 120 mg daily by mouth.      diltiazem (CARDIZEM) 60 MG tablet Take 60 mg by mouth daily as needed (for increased heart rate > 140).     diphenhydrAMINE (BENADRYL) 25 MG tablet Take 25 mg by mouth as directed. With chemo treatment     ELIQUIS 5 MG TABS tablet TAKE 1 TABLET TWICE A DAY 180 tablet 1   fluticasone (FLONASE) 50 MCG/ACT nasal spray Place 1 spray into the nose daily as needed for allergies.     Hypromellose (ARTIFICIAL TEARS OP) Place 1 drop into  both eyes as needed (for dry eyes).     loperamide (IMODIUM) 2 MG capsule Take 4 mg by mouth as needed for diarrhea or loose stools.     loratadine (CLARITIN) 10 MG tablet Take 10 mg by mouth daily as needed.     lovastatin (MEVACOR) 20 MG tablet Take 20 mg by mouth every evening.      montelukast (SINGULAIR) 10 MG tablet Take 1 tablet by mouth on the day before, day of, and 2 days after treatment. 30 tablet 1   Multiple Vitamins-Iron (MULTIVITAMIN/IRON PO) Take 1 tablet by mouth daily.      naloxone (NARCAN) nasal spray 4 mg/0.1 mL Place 1 spray into the nose as needed for up to 365 doses (for opioid-induced respiratory depresssion). In case of emergency (overdose), spray once into each nostril. If no response within 3 minutes, repeat application and call 322. 1 each 0   NON FORMULARY IT pump  Fentanyl 1,500.0 mcg/ml Bupivicaine 30.0 mg/ml Clonidine 300.0 mcg/ml Rate 500.4 mg/day     omeprazole (PRILOSEC) 20 MG capsule Take 20 mg by mouth daily.      ondansetron (ZOFRAN) 8 MG tablet Take 1 tablet (8 mg total) by mouth as directed. Take with chemo and can take it as needed 20 tablet 1   PAIN MANAGEMENT INTRATHECAL, IT, PUMP 1 each by Intrathecal route. Intrathecal (IT) medication:  Fentanyl 1500 mcg/ml, bupivicaine 30.0 mg/ml, clonidine 300.0 mcg/ml Daily dose fent,.500.33mg/day, bupivacaine 10.0058mday, clonidine 1000.07 mcg/day     tamsulosin (FLOMAX) 0.4 MG CAPS capsule Take 1 capsule (0.4 mg total) by mouth daily. 90 capsule 3   valACYclovir (VALTREX) 500 MG tablet TAKE 1 TABLET DAILY 90 tablet 3  vitamin B-12 (CYANOCOBALAMIN) 1000 MCG tablet Take 1,000 mcg daily by mouth.      No current facility-administered medications for this visit.      [name]  PHYSICAL EXAMINATION: ECOG PERFORMANCE STATUS: 1 - Symptomatic but completely ambulatory  There were no vitals filed for this visit.  There were no vitals filed for this visit.   Physical Exam Vitals and nursing note  reviewed.  HENT:     Head: Normocephalic and atraumatic.     Mouth/Throat:     Mouth: Mucous membranes are moist.     Pharynx: No oropharyngeal exudate.  Eyes:     Pupils: Pupils are equal, round, and reactive to light.  Cardiovascular:     Rate and Rhythm: Normal rate and regular rhythm.  Pulmonary:     Effort: No respiratory distress.     Breath sounds: No wheezing.     Comments: Decreased breath sounds bilaterally at bases.  No wheeze or crackles Abdominal:     General: Bowel sounds are normal. There is no distension.     Palpations: Abdomen is soft. There is no mass.     Tenderness: There is no abdominal tenderness. There is no guarding or rebound.  Musculoskeletal:        General: No tenderness. Normal range of motion.     Cervical back: Normal range of motion and neck supple.  Skin:    General: Skin is warm.  Neurological:     Mental Status: He is alert and oriented to person, place, and time.  Psychiatric:        Mood and Affect: Affect normal.        Judgment: Judgment normal.    LABORATORY DATA:  I have reviewed the data as listed Lab Results  Component Value Date   WBC 7.9 09/07/2021   HGB 13.2 09/07/2021   HCT 38.0 (L) 09/07/2021   MCV 98.7 09/07/2021   PLT 193 09/07/2021   Recent Labs    07/12/21 0823 08/09/21 0953 09/07/21 0942  NA 136 133* 135  K 4.1 4.1 3.8  CL 101 100 108  CO2 _0 GLUCOSE 146* 177* 102*  BUN _1 CREATININE 1.49* 1.42* 1.32*  CALCIUM 8.8* 8.9 8.6*  GFRNONAA 49* 52* 56*  PROT 6.4* 6.3* 6.4*  ALBUMIN 3.9 4.2 3.9  AST _2 ALT _3 ALKPHOS 49 44 52  BILITOT 0.5 0.7 0.5     RADIOGRAPHIC STUDIES: I have personally reviewed the radiological images as listed and agreed with the findings in the report. No results found.  ASSESSMENT & PLAN:   No problem-specific Assessment & Plan notes found for this encounter.  All questions were answered. The patient knows to call the clinic with any problems,  questions or concerns.    Cammie Sickle, MD 10/05/2021 9:20 AM

## 2021-10-05 NOTE — Assessment & Plan Note (Deleted)
#  Multiple myeloma: Status post autologous and cell transplant-s/p relapse currently on daratumumab- M spike fluctuates between 0.0 - 0.5 gm/dL in the past year. June 2022- M protein- NEG [IF; K/L 1.76]. ] PET scan on 08/14/2018 revealed no evidence of active myeloma. Bone survey on 11/10/2020 revealed no focal lytic lesions. There were stable compression fractions of T5, T10 and T12. NOV 2022- M protein-NONE;IFE-positive/? dara; K/l- 1.0- STABLE.   # proceed with daratumumab today. Labs today reviewed;  acceptable for treatment today. SQ dex; dexamethasone 24 mg weekly on pre-meds [from home]; check 24-hour urine collection also  # PN/restless leg syndrome- [sec to thalilomid]- on baclofen 5-10 qhs. STABLE.  # Chronic diarrhea: [2-3 times a day]on immodium prn-  STABLE.   # ONJ- discontinued Zometa- STABLE.   # History of pulmonary embolism/ atrial fibrillation [Dr.Kowalski] on Eliquis- STABLE.    # CKD- stage III-  STABLE.   # Compression fractures sec to MM- s/p Pain pump [Dr.Naveira; 2012]; STABLE.   # Vaccination: s/p Flu shots [2022]; UNVACCINATED to COVID. Discussed re: [facility]; will get back to us.   #Mediport-malfunction check port study  # DISPOSITION: wants 9:30 appts # Proceed with daratumumab today.  # follow up in 4 weeks- MD; labs [CBC with diff, CMP, Mg, MM panel;K/L ratio]- Dara IV- Dr.B 

## 2021-10-09 DEATH — deceased

## 2021-12-02 NOTE — Telephone Encounter (Signed)
Signing encounter see previous note on 12/20/20 °

## 2021-12-06 ENCOUNTER — Encounter: Payer: Medicare Other | Admitting: Pain Medicine

## 2022-05-30 ENCOUNTER — Ambulatory Visit: Payer: Medicare Other | Admitting: Urology
